# Patient Record
Sex: Male | Born: 1965 | Race: White | Hispanic: No | Marital: Single | Smoking: Current every day smoker
Health system: Southern US, Community
[De-identification: ages and names within clinical notes are randomized; demographics above are authoritative.]

## PROBLEM LIST (undated history)

## (undated) DIAGNOSIS — M48061 Spinal stenosis, lumbar region without neurogenic claudication: Secondary | ICD-10-CM

## (undated) DIAGNOSIS — G4730 Sleep apnea, unspecified: Secondary | ICD-10-CM

## (undated) DIAGNOSIS — G5710 Meralgia paresthetica, unspecified lower limb: Secondary | ICD-10-CM

## (undated) DIAGNOSIS — K649 Unspecified hemorrhoids: Secondary | ICD-10-CM

## (undated) DIAGNOSIS — H3500 Unspecified background retinopathy: Secondary | ICD-10-CM

## (undated) DIAGNOSIS — R32 Unspecified urinary incontinence: Secondary | ICD-10-CM

## (undated) DIAGNOSIS — E1140 Type 2 diabetes mellitus with diabetic neuropathy, unspecified: Secondary | ICD-10-CM

## (undated) DIAGNOSIS — J189 Pneumonia, unspecified organism: Secondary | ICD-10-CM

## (undated) DIAGNOSIS — E785 Hyperlipidemia, unspecified: Secondary | ICD-10-CM

## (undated) DIAGNOSIS — I509 Heart failure, unspecified: Secondary | ICD-10-CM

## (undated) DIAGNOSIS — F1910 Other psychoactive substance abuse, uncomplicated: Secondary | ICD-10-CM

## (undated) DIAGNOSIS — M47816 Spondylosis without myelopathy or radiculopathy, lumbar region: Secondary | ICD-10-CM

## (undated) DIAGNOSIS — E669 Obesity, unspecified: Secondary | ICD-10-CM

## (undated) DIAGNOSIS — E1121 Type 2 diabetes mellitus with diabetic nephropathy: Secondary | ICD-10-CM

## (undated) DIAGNOSIS — I5031 Acute diastolic (congestive) heart failure: Secondary | ICD-10-CM

## (undated) DIAGNOSIS — I10 Essential (primary) hypertension: Secondary | ICD-10-CM

## (undated) HISTORY — DX: Spinal stenosis, lumbar region without neurogenic claudication: M48.061

## (undated) HISTORY — DX: Type 2 diabetes mellitus with diabetic neuropathy, unspecified: E11.40

## (undated) HISTORY — DX: Other psychoactive substance abuse, uncomplicated: F19.10

## (undated) HISTORY — DX: Hyperlipidemia, unspecified: E78.5

## (undated) HISTORY — DX: Type 2 diabetes mellitus with diabetic nephropathy: E11.21

## (undated) HISTORY — DX: Unspecified hemorrhoids: K64.9

## (undated) HISTORY — DX: Unspecified urinary incontinence: R32

## (undated) HISTORY — DX: Unspecified background retinopathy: H35.00

## (undated) HISTORY — PX: ABDOMINAL SURGERY: SHX537

## (undated) HISTORY — DX: Essential (primary) hypertension: I10

## (undated) HISTORY — DX: Meralgia paresthetica, unspecified lower limb: G57.10

## (undated) HISTORY — PX: TONSILLECTOMY: SHX5217

## (undated) HISTORY — PX: COLONOSCOPY W/ POLYPECTOMY: SHX1380

## (undated) HISTORY — DX: Spondylosis without myelopathy or radiculopathy, lumbar region: M47.816

---

## 2006-10-21 ENCOUNTER — Emergency Department (HOSPITAL_COMMUNITY): Admission: EM | Admit: 2006-10-21 | Discharge: 2006-10-21 | Payer: Self-pay | Admitting: Emergency Medicine

## 2007-03-23 ENCOUNTER — Emergency Department (HOSPITAL_COMMUNITY): Admission: EM | Admit: 2007-03-23 | Discharge: 2007-03-23 | Payer: Self-pay | Admitting: Emergency Medicine

## 2007-03-23 ENCOUNTER — Encounter (INDEPENDENT_AMBULATORY_CARE_PROVIDER_SITE_OTHER): Payer: Self-pay | Admitting: Emergency Medicine

## 2007-03-23 ENCOUNTER — Ambulatory Visit: Payer: Self-pay | Admitting: Vascular Surgery

## 2007-07-07 ENCOUNTER — Encounter (INDEPENDENT_AMBULATORY_CARE_PROVIDER_SITE_OTHER): Payer: Self-pay | Admitting: Internal Medicine

## 2007-07-07 ENCOUNTER — Ambulatory Visit: Payer: Self-pay | Admitting: Family Medicine

## 2007-07-07 LAB — CONVERTED CEMR LAB
Basophils Absolute: 0 10*3/uL (ref 0.0–0.1)
Basophils Relative: 0 % (ref 0–1)
CO2: 27 meq/L (ref 19–32)
Creatinine, Ser: 1.03 mg/dL (ref 0.40–1.50)
Glucose, Bld: 193 mg/dL — ABNORMAL HIGH (ref 70–99)
Hemoglobin: 17.3 g/dL — ABNORMAL HIGH (ref 13.0–17.0)
Lymphocytes Relative: 26 % (ref 12–46)
Microalb, Ur: 1.61 mg/dL (ref 0.00–1.89)
Monocytes Absolute: 0.8 10*3/uL — ABNORMAL HIGH (ref 0.2–0.7)
Neutro Abs: 6.9 10*3/uL (ref 1.7–7.7)
Neutrophils Relative %: 63 % (ref 43–77)
Platelets: 234 10*3/uL (ref 150–400)
RDW: 12.8 % (ref 11.5–14.0)
Total Bilirubin: 0.6 mg/dL (ref 0.3–1.2)

## 2007-07-11 ENCOUNTER — Ambulatory Visit: Payer: Self-pay | Admitting: *Deleted

## 2007-11-08 ENCOUNTER — Ambulatory Visit: Payer: Self-pay | Admitting: Family Medicine

## 2008-01-03 ENCOUNTER — Ambulatory Visit: Payer: Self-pay | Admitting: Internal Medicine

## 2008-01-24 ENCOUNTER — Ambulatory Visit: Payer: Self-pay | Admitting: Internal Medicine

## 2008-01-24 ENCOUNTER — Encounter (INDEPENDENT_AMBULATORY_CARE_PROVIDER_SITE_OTHER): Payer: Self-pay | Admitting: Family Medicine

## 2008-01-24 LAB — CONVERTED CEMR LAB
ALT: 15 units/L (ref 0–53)
AST: 10 units/L (ref 0–37)
Albumin: 3.9 g/dL (ref 3.5–5.2)
Alkaline Phosphatase: 121 units/L — ABNORMAL HIGH (ref 39–117)
Potassium: 4.7 meq/L (ref 3.5–5.3)
Total Protein: 6.7 g/dL (ref 6.0–8.3)
Triglycerides: 792 mg/dL — ABNORMAL HIGH (ref <150)

## 2008-05-14 ENCOUNTER — Ambulatory Visit: Payer: Self-pay | Admitting: Internal Medicine

## 2008-05-18 ENCOUNTER — Emergency Department (HOSPITAL_COMMUNITY): Admission: EM | Admit: 2008-05-18 | Discharge: 2008-05-18 | Payer: Self-pay | Admitting: Emergency Medicine

## 2008-05-25 ENCOUNTER — Inpatient Hospital Stay (HOSPITAL_COMMUNITY): Admission: RE | Admit: 2008-05-25 | Discharge: 2008-05-29 | Payer: Self-pay | Admitting: General Surgery

## 2008-09-07 ENCOUNTER — Encounter (INDEPENDENT_AMBULATORY_CARE_PROVIDER_SITE_OTHER): Payer: Self-pay | Admitting: Adult Health

## 2008-09-07 ENCOUNTER — Ambulatory Visit: Payer: Self-pay | Admitting: Internal Medicine

## 2008-09-07 LAB — CONVERTED CEMR LAB
Albumin: 4.2 g/dL (ref 3.5–5.2)
CO2: 24 meq/L (ref 19–32)
Chloride: 105 meq/L (ref 96–112)
Cholesterol: 271 mg/dL — ABNORMAL HIGH (ref 0–200)
Glucose, Bld: 81 mg/dL (ref 70–99)
HCT: 49.4 % (ref 39.0–52.0)
Lymphocytes Relative: 27 % (ref 12–46)
Lymphs Abs: 2.3 10*3/uL (ref 0.7–4.0)
MCHC: 33 g/dL (ref 30.0–36.0)
MCV: 90.1 fL (ref 78.0–100.0)
Neutrophils Relative %: 62 % (ref 43–77)
Platelets: 162 10*3/uL (ref 150–400)
Potassium: 4.2 meq/L (ref 3.5–5.3)
Sodium: 141 meq/L (ref 135–145)
Total CHOL/HDL Ratio: 7.3
Total Protein: 7 g/dL (ref 6.0–8.3)
Triglycerides: 797 mg/dL — ABNORMAL HIGH (ref <150)
WBC: 8.5 10*3/uL (ref 4.0–10.5)

## 2008-09-21 ENCOUNTER — Ambulatory Visit: Payer: Self-pay | Admitting: Internal Medicine

## 2008-09-26 ENCOUNTER — Encounter: Payer: Self-pay | Admitting: Internal Medicine

## 2008-09-26 ENCOUNTER — Ambulatory Visit (HOSPITAL_COMMUNITY): Admission: RE | Admit: 2008-09-26 | Discharge: 2008-09-26 | Payer: Self-pay | Admitting: Internal Medicine

## 2008-09-26 ENCOUNTER — Ambulatory Visit: Payer: Self-pay | Admitting: Surgery

## 2008-10-30 ENCOUNTER — Ambulatory Visit: Payer: Self-pay | Admitting: Internal Medicine

## 2008-10-31 ENCOUNTER — Encounter (INDEPENDENT_AMBULATORY_CARE_PROVIDER_SITE_OTHER): Payer: Self-pay | Admitting: Internal Medicine

## 2008-12-19 ENCOUNTER — Ambulatory Visit: Payer: Self-pay | Admitting: Family Medicine

## 2009-02-20 ENCOUNTER — Ambulatory Visit: Payer: Self-pay | Admitting: Internal Medicine

## 2009-03-27 ENCOUNTER — Ambulatory Visit: Payer: Self-pay | Admitting: Internal Medicine

## 2009-05-21 ENCOUNTER — Ambulatory Visit: Payer: Self-pay | Admitting: Internal Medicine

## 2009-08-06 ENCOUNTER — Ambulatory Visit: Payer: Self-pay | Admitting: Internal Medicine

## 2009-08-06 ENCOUNTER — Encounter (INDEPENDENT_AMBULATORY_CARE_PROVIDER_SITE_OTHER): Payer: Self-pay | Admitting: Adult Health

## 2009-08-06 LAB — CONVERTED CEMR LAB
Albumin: 4.2 g/dL (ref 3.5–5.2)
Alkaline Phosphatase: 104 units/L (ref 39–117)
BUN: 17 mg/dL (ref 6–23)
Basophils Absolute: 0 10*3/uL (ref 0.0–0.1)
Basophils Relative: 0 % (ref 0–1)
CO2: 22 meq/L (ref 19–32)
Cholesterol: 316 mg/dL — ABNORMAL HIGH (ref 0–200)
Eosinophils Relative: 3 % (ref 0–5)
Glucose, Bld: 164 mg/dL — ABNORMAL HIGH (ref 70–99)
HCT: 46.8 % (ref 39.0–52.0)
HDL: 39 mg/dL — ABNORMAL LOW (ref >39)
Lymphocytes Relative: 31 % (ref 12–46)
Lymphs Abs: 2.7 10*3/uL (ref 0.7–4.0)
MCHC: 32.9 g/dL (ref 30.0–36.0)
MCV: 92.5 fL (ref 78.0–100.0)
Monocytes Absolute: 0.8 10*3/uL (ref 0.1–1.0)
Platelets: 175 10*3/uL (ref 150–400)
Potassium: 4.1 meq/L (ref 3.5–5.3)
RBC: 5.06 M/uL (ref 4.22–5.81)
Triglycerides: 1090 mg/dL — ABNORMAL HIGH (ref <150)
WBC: 8.6 10*3/uL (ref 4.0–10.5)

## 2009-08-07 ENCOUNTER — Encounter (INDEPENDENT_AMBULATORY_CARE_PROVIDER_SITE_OTHER): Payer: Self-pay | Admitting: Adult Health

## 2009-09-02 ENCOUNTER — Ambulatory Visit: Payer: Self-pay | Admitting: Internal Medicine

## 2009-09-04 ENCOUNTER — Ambulatory Visit (HOSPITAL_COMMUNITY): Admission: RE | Admit: 2009-09-04 | Discharge: 2009-09-04 | Payer: Self-pay | Admitting: Internal Medicine

## 2009-11-20 ENCOUNTER — Ambulatory Visit: Payer: Self-pay | Admitting: Family Medicine

## 2009-11-25 ENCOUNTER — Ambulatory Visit (HOSPITAL_COMMUNITY): Admission: RE | Admit: 2009-11-25 | Discharge: 2009-11-25 | Payer: Self-pay | Admitting: Internal Medicine

## 2010-01-10 ENCOUNTER — Ambulatory Visit: Payer: Self-pay | Admitting: Family Medicine

## 2010-02-18 ENCOUNTER — Ambulatory Visit: Payer: Self-pay | Admitting: Internal Medicine

## 2010-02-18 ENCOUNTER — Encounter (INDEPENDENT_AMBULATORY_CARE_PROVIDER_SITE_OTHER): Payer: Self-pay | Admitting: Adult Health

## 2010-02-18 LAB — CONVERTED CEMR LAB
Benzodiazepines.: NEGATIVE
Cocaine Metabolites: NEGATIVE
Creatinine,U: 151 mg/dL
Microalb, Ur: 10.43 mg/dL — ABNORMAL HIGH (ref 0.00–1.89)
Phencyclidine (PCP): NEGATIVE
Propoxyphene: NEGATIVE

## 2010-03-05 ENCOUNTER — Emergency Department (HOSPITAL_COMMUNITY)
Admission: EM | Admit: 2010-03-05 | Discharge: 2010-03-05 | Payer: Self-pay | Source: Home / Self Care | Admitting: Emergency Medicine

## 2010-03-26 ENCOUNTER — Ambulatory Visit: Payer: Self-pay | Admitting: Internal Medicine

## 2010-05-09 ENCOUNTER — Ambulatory Visit: Payer: Self-pay | Admitting: Internal Medicine

## 2010-05-09 LAB — CONVERTED CEMR LAB
BUN: 21 mg/dL (ref 6–23)
CO2: 25 meq/L (ref 19–32)
Calcium: 9.5 mg/dL (ref 8.4–10.5)
Cholesterol: 400 mg/dL — ABNORMAL HIGH (ref 0–200)
Glucose, Bld: 261 mg/dL — ABNORMAL HIGH (ref 70–99)
Sodium: 135 meq/L (ref 135–145)
Total Bilirubin: 0.5 mg/dL (ref 0.3–1.2)
Total Protein: 6.7 g/dL (ref 6.0–8.3)

## 2010-09-16 ENCOUNTER — Encounter (INDEPENDENT_AMBULATORY_CARE_PROVIDER_SITE_OTHER): Payer: Self-pay | Admitting: Internal Medicine

## 2010-09-16 LAB — CONVERTED CEMR LAB
Alkaline Phosphatase: 123 units/L — ABNORMAL HIGH (ref 39–117)
Basophils Absolute: 0.1 10*3/uL (ref 0.0–0.1)
CRP: 0.4 mg/dL (ref <0.6)
Cholesterol: 361 mg/dL — ABNORMAL HIGH (ref 0–200)
Direct LDL: 99 mg/dL
HCT: 50.1 % (ref 39.0–52.0)
HDL: 33 mg/dL — ABNORMAL LOW (ref >39)
Lymphs Abs: 2.2 10*3/uL (ref 0.7–4.0)
MCHC: 34.3 g/dL (ref 30.0–36.0)
MCV: 91.4 fL (ref 78.0–100.0)
Microalb, Ur: 90.64 mg/dL — ABNORMAL HIGH (ref 0.00–1.89)
Monocytes Relative: 7 % (ref 3–12)
Neutrophils Relative %: 68 % (ref 43–77)
RBC: 5.48 M/uL (ref 4.22–5.81)
Sed Rate: 11 mm/hr (ref 0–16)
Sodium: 136 meq/L (ref 135–145)
TSH: 2.23 microintl units/mL (ref 0.350–4.500)
Total Bilirubin: 0.4 mg/dL (ref 0.3–1.2)
Total CHOL/HDL Ratio: 10.9
Total Protein: 6.7 g/dL (ref 6.0–8.3)
Vitamin B-12: 624 pg/mL (ref 211–911)

## 2010-09-29 ENCOUNTER — Emergency Department (HOSPITAL_COMMUNITY)
Admission: EM | Admit: 2010-09-29 | Discharge: 2010-09-29 | Payer: Self-pay | Source: Home / Self Care | Admitting: Emergency Medicine

## 2010-09-29 ENCOUNTER — Ambulatory Visit (HOSPITAL_COMMUNITY)
Admission: RE | Admit: 2010-09-29 | Discharge: 2010-09-29 | Payer: Self-pay | Source: Home / Self Care | Attending: Family Medicine | Admitting: Family Medicine

## 2010-10-02 ENCOUNTER — Emergency Department (HOSPITAL_COMMUNITY)
Admission: EM | Admit: 2010-10-02 | Discharge: 2010-10-02 | Disposition: A | Discharge status: Home / Self Care | Payer: Worker's Compensation | Attending: Emergency Medicine | Admitting: Emergency Medicine

## 2010-10-02 ENCOUNTER — Emergency Department (HOSPITAL_COMMUNITY): Payer: Worker's Compensation

## 2010-10-02 DIAGNOSIS — F341 Dysthymic disorder: Secondary | ICD-10-CM | POA: Insufficient documentation

## 2010-10-02 DIAGNOSIS — Y92410 Unspecified street and highway as the place of occurrence of the external cause: Secondary | ICD-10-CM | POA: Insufficient documentation

## 2010-10-02 DIAGNOSIS — Z79899 Other long term (current) drug therapy: Secondary | ICD-10-CM | POA: Insufficient documentation

## 2010-10-02 DIAGNOSIS — S0990XA Unspecified injury of head, initial encounter: Secondary | ICD-10-CM | POA: Insufficient documentation

## 2010-10-02 DIAGNOSIS — I10 Essential (primary) hypertension: Secondary | ICD-10-CM | POA: Insufficient documentation

## 2010-10-02 DIAGNOSIS — E119 Type 2 diabetes mellitus without complications: Secondary | ICD-10-CM | POA: Insufficient documentation

## 2010-11-16 LAB — CBC
Hemoglobin: 14.1 g/dL (ref 13.0–17.0)
RBC: 4.48 MIL/uL (ref 4.22–5.81)
WBC: 7.3 10*3/uL (ref 4.0–10.5)

## 2010-11-16 LAB — RAPID URINE DRUG SCREEN, HOSP PERFORMED
Barbiturates: NOT DETECTED
Opiates: NOT DETECTED
Tetrahydrocannabinol: NOT DETECTED

## 2010-11-16 LAB — COMPREHENSIVE METABOLIC PANEL
Albumin: 3.2 g/dL — ABNORMAL LOW (ref 3.5–5.2)
BUN: 14 mg/dL (ref 6–23)
Calcium: 8.7 mg/dL (ref 8.4–10.5)
Chloride: 105 mEq/L (ref 96–112)
Potassium: 4.3 mEq/L (ref 3.5–5.1)
Total Bilirubin: 0.6 mg/dL (ref 0.3–1.2)
Total Protein: 6.4 g/dL (ref 6.0–8.3)

## 2010-11-16 LAB — DIFFERENTIAL
Basophils Relative: 1 % (ref 0–1)
Eosinophils Absolute: 0.1 10*3/uL (ref 0.0–0.7)
Eosinophils Relative: 1 % (ref 0–5)
Lymphocytes Relative: 22 % (ref 12–46)

## 2011-01-13 NOTE — Discharge Summary (Signed)
NAME:  Jerry Cantrell, Jerry Cantrell                ACCOUNT NO.:  [id number]   MEDICAL RECORD NO.:  [id number]          PATIENT TYPE:  OIB   LOCATION:  5155                         FACILITY:  [facility]   PHYSICIAN:  Joyice Faster. Cornett, M.D.DATE OF BIRTH:  March 03, 1966   DATE OF ADMISSION:  05/25/2008  DATE OF DISCHARGE:  05/29/2008                               DISCHARGE SUMMARY   PROCEDURE:  I&D of lower abdominal wall abscesses by Dr. Barry Dienes on  May 25, 2008.   CONSULTATIONS:  None.   REASON FOR ADMISSION:  Jerry Cantrell is a 45 year old white male with a  history of diabetes who has developed severe pain and swelling with a  significant amount of induration over his suprapubic area for  approximately 1 week prior to his admission.  At the time of admission,  Jerry Cantrell had a focal area of drainage around the right suprapubic area  that had a very foul odor.  At this time after his evaluation at our  office, Dr. Marlou Starks felt that these needed to be I&D and therefore was sent  over to the hospital where Dr. Barry Dienes tested him and an I&D was  performed.  Please see admitting history and physical for further  details.   ADMITTING DIAGNOSES:  1. Large abdominal wall abscess.  2. Diabetes.   HOSPITAL COURSE:  Once the patient arrived to the hospital, he was taken  to the OR where an incision and drainage of his abdominal wall abscess  was completed.  The patient tolerated this procedure well and was later  sent back to a regular floor bed with no further problems.  On  postoperative day #1, the patient had a moderate drainage from his  wound.  At this time, the patient was continued on IV vancomycin as well  as different p.r.n. medications for pain control.  Over the next several  days, the patient continued to have pain with some drainage from the  wound.  However, they continued to pack these with iodoform gauze.  By  postoperative day #3, the patient was still having pain; however, his  erythema  around these wounds have decreased, although the induration was  still there.  At this time, the drainage from his wound had somewhat  decreased.  At this time, his cultures were still pending except for one  anaerobic culture which showed few gram-positive cocci but this is still  a preliminary culture and no definitive bacteria has yet to be  differentiated out.  By postoperative day #4, the patient was continuing  to complain of some pain; however, at this time, he was ready to go  home.  On exam, once again his suprapubic region had a decrease in  erythema; however, was still very indurated.  At this time, his wounds  were clean and packed with iodoform gauze.  At this time, also home  health had been arranged and the patient was felt stable for discharge  back to his home with home health assistance for dressing changes.   DISCHARGE DIAGNOSES:  1. Multiple abdominal wall abscesses.  2. Status post incision and  drainage of multiple abdominal wall      abscesses.  3. Diabetes mellitus.   DISCHARGE MEDICATIONS:  1. The patient was informed that he may resume all home medications      which include a blood pressure medicine which he is unsure of and      insulin pen 70/30.  Apparently, he takes 70 units in the a.m. and      70 units at night.  2. Lantus 10 units at  8 p.m.  3. Insulin sliding scale as needed.  4. He was also given a prescription for Percocet 5/325 one-two tablets      q.4 h. p.r.n. for pain.  5. Doxycycline 100 mg b.i.d. for 14 days.   DISCHARGE INSTRUCTIONS:  The patient is informed that he may resume a  regular carbohydrate modified diet to help with his diabetes.  Otherwise, he may return to his normal activity including walking up  steps and walking.  Otherwise, he is allowed to shower; however, he is  not to bath for the next several weeks.  He is informed that when he  takes the shower, he needs to remove his wound dressings prior to his  shower and let the  water run in his wound.  Once he gets done with the  shower, he is to let this area dry and then repack it once his cleansing  process is complete.  Otherwise, home health has been advised to change  most of his abdominal dressings with 1/2-inch iodoform gauze once a day  and cover with a dry dressing.  Otherwise, the patient is informed to  return to see Dr. Barry Dienes at our office in approximately 1-2 weeks for  followup.  Also of note, the patient does not have any health insurance  and states that he gets most of his medicines from [facility].  He has  informed me that he will be able to get his doxycycline from  [facility], and he should be able to get this without a problem.      Henreitta Cea, PA      Marcello Moores A. Cornett, M.D.  Electronically Signed    KEO/MEDQ  D:  05/29/2008  T:  05/30/2008  Job:  [id number]   cc:   Sid Falcon, MD

## 2011-01-13 NOTE — Op Note (Signed)
NAME:  Jerry Cantrell, Jerry Cantrell                ACCOUNT NO.:  [id number]   MEDICAL RECORD NO.:  [id number]          PATIENT TYPE:  OIB   LOCATION:  5123                         FACILITY:  [facility]   PHYSICIAN:  Stark Klein, MD       DATE OF BIRTH:  12/21/1965   DATE OF PROCEDURE:  05/25/2008  DATE OF DISCHARGE:                               OPERATIVE REPORT   PREOPERATIVE DIAGNOSIS:  Abdominal wall abscess.   POSTOPERATIVE DIAGNOSIS:  Abdominal wall abscess.   PROCEDURE PERFORMED:  Incision and drainage of abdominal wall abscess  x2.   SURGEON:  Stark Klein, MD   ASSISTANT:  Henreitta Cea, PA   ANESTHESIA:  General.   FINDINGS:  Two abdominal wall abscesses, one in the right suprainguinal  region and one midline to the left inguinal region.  Minimal pus found,  but significant odor.  Multiloculated cavity.  Two cultures were taken.   COMPLICATIONS:  None.   PROCEDURE:  [name] was identified in the holding area and taken to  the operating room, where he was placed supine on the operating table.  General anesthesia was induced.  Time out was performed according to the  surgical checklist.  His lower abdominal wall was prepped and draped in  the sterile fashion.  He had a small punctate lesion draining pus on the  right groin.  This was incised with a #15 blade.  The subcutaneous  tissue was spread and probed, and there was a cavity with some pus in  it.  A piece of tissue as well as fluid was swabbed and sent for  culture.  There was an additional concerning area with thinned skin over  the top of it and this was aspirated and there was a purulent bloody  drainage found.  This was opened as well.  This also was probed  digitally.  The cavities were broken up.  Both areas were packed with a  2-inch iodoform gauze and dressed with wet gauze and mesh underwear.  The patient was awakened from anesthesia and taken to the PACU in good  condition.      Stark Klein, MD  Electronically  Signed     FB/MEDQ  D:  05/25/2008  T:  05/26/2008  Job:  [id number]

## 2011-06-01 LAB — URINALYSIS, ROUTINE W REFLEX MICROSCOPIC
Bilirubin Urine: NEGATIVE
Leukocytes, UA: NEGATIVE
Specific Gravity, Urine: 1.038 — ABNORMAL HIGH

## 2011-06-01 LAB — BODY FLUID CULTURE: Culture: NO GROWTH

## 2011-06-01 LAB — GLUCOSE, CAPILLARY
Glucose-Capillary: 426 — ABNORMAL HIGH
Glucose-Capillary: 176 — ABNORMAL HIGH
Glucose-Capillary: 185 — ABNORMAL HIGH
Glucose-Capillary: 266 — ABNORMAL HIGH
Glucose-Capillary: 282 — ABNORMAL HIGH
Glucose-Capillary: 71
Glucose-Capillary: 95

## 2011-06-01 LAB — DIFFERENTIAL
Basophils Absolute: 0
Basophils Absolute: 0
Basophils Relative: 0
Eosinophils Absolute: 0.1
Eosinophils Relative: 1
Eosinophils Relative: 2
Lymphocytes Relative: 18
Monocytes Absolute: 0.7
Monocytes Absolute: 0.8

## 2011-06-01 LAB — CBC
HCT: 45.7
HCT: 40.5
Hemoglobin: 15.8
Hemoglobin: 12.6 — ABNORMAL LOW
Hemoglobin: 13.8
MCHC: 34.1
MCV: 90.2
RBC: 4.13 — ABNORMAL LOW
RDW: 12.7
RDW: 12.7
RDW: 12.8

## 2011-06-01 LAB — ANAEROBIC CULTURE: Gram Stain: NONE SEEN

## 2011-06-01 LAB — BASIC METABOLIC PANEL
BUN: 18
BUN: 10
BUN: 18
CO2: 26
CO2: 28
Calcium: 9.3
Chloride: 98
Chloride: 96
GFR calc non Af Amer: >60
Glucose, Bld: 290 — ABNORMAL HIGH
Glucose, Bld: 251 — ABNORMAL HIGH
Glucose, Bld: 259 — ABNORMAL HIGH
Potassium: 4
Potassium: 3.9
Potassium: 4.1
Sodium: 134 — ABNORMAL LOW
Sodium: 134 — ABNORMAL LOW
Sodium: 140

## 2011-06-01 LAB — CULTURE, ROUTINE-ABSCESS

## 2011-06-15 LAB — POCT CARDIAC MARKERS
CKMB, poc: <1 — ABNORMAL LOW
Myoglobin, poc: 76.8
Operator id: 198171
Troponin i, poc: <0.05

## 2011-06-15 LAB — URINALYSIS, ROUTINE W REFLEX MICROSCOPIC
Glucose, UA: 250 — AB
Ketones, ur: NEGATIVE
Nitrite: NEGATIVE
Protein, ur: NEGATIVE
Urobilinogen, UA: 0.2

## 2011-06-15 LAB — CBC
Hemoglobin: 15.2
MCHC: 33.8
RBC: 5.07
WBC: 7.5

## 2011-06-15 LAB — I-STAT 8, (EC8 V) (CONVERTED LAB)
BUN: 21
Bicarbonate: 28.1 — ABNORMAL HIGH
Glucose, Bld: 262 — ABNORMAL HIGH
Hemoglobin: 16.7
TCO2: 29
pH, Ven: 7.463 — ABNORMAL HIGH

## 2011-06-15 LAB — DIFFERENTIAL
Basophils Relative: 1
Lymphs Abs: 1.6
Monocytes Absolute: 0.5
Monocytes Relative: 7
Neutro Abs: 5
Neutrophils Relative %: 67

## 2011-06-15 LAB — POCT I-STAT CREATININE: Operator id: 198171

## 2012-01-14 ENCOUNTER — Ambulatory Visit (HOSPITAL_COMMUNITY)
Admission: RE | Admit: 2012-01-14 | Discharge: 2012-01-14 | Disposition: A | Discharge status: Home / Self Care | Payer: Self-pay | Source: Ambulatory Visit | Attending: Family Medicine | Admitting: Family Medicine

## 2012-01-14 ENCOUNTER — Other Ambulatory Visit (HOSPITAL_COMMUNITY): Payer: Self-pay | Admitting: Family Medicine

## 2012-01-14 DIAGNOSIS — M545 Low back pain, unspecified: Secondary | ICD-10-CM | POA: Insufficient documentation

## 2012-01-14 DIAGNOSIS — M2550 Pain in unspecified joint: Secondary | ICD-10-CM

## 2012-01-14 DIAGNOSIS — M79609 Pain in unspecified limb: Secondary | ICD-10-CM | POA: Insufficient documentation

## 2012-01-14 DIAGNOSIS — M79659 Pain in unspecified thigh: Secondary | ICD-10-CM

## 2012-01-26 ENCOUNTER — Other Ambulatory Visit (HOSPITAL_COMMUNITY): Payer: Self-pay | Admitting: Family Medicine

## 2012-01-26 DIAGNOSIS — M79659 Pain in unspecified thigh: Secondary | ICD-10-CM

## 2012-01-28 ENCOUNTER — Ambulatory Visit (HOSPITAL_COMMUNITY)
Admission: RE | Admit: 2012-01-28 | Discharge: 2012-01-28 | Disposition: A | Discharge status: Home / Self Care | Payer: Self-pay | Source: Ambulatory Visit | Attending: Family Medicine | Admitting: Family Medicine

## 2012-01-28 DIAGNOSIS — M79659 Pain in unspecified thigh: Secondary | ICD-10-CM

## 2012-01-28 DIAGNOSIS — M545 Low back pain, unspecified: Secondary | ICD-10-CM | POA: Insufficient documentation

## 2012-01-28 DIAGNOSIS — M25559 Pain in unspecified hip: Secondary | ICD-10-CM | POA: Insufficient documentation

## 2012-01-28 DIAGNOSIS — M47817 Spondylosis without myelopathy or radiculopathy, lumbosacral region: Secondary | ICD-10-CM | POA: Insufficient documentation

## 2012-02-11 ENCOUNTER — Emergency Department (HOSPITAL_COMMUNITY)
Admission: EM | Admit: 2012-02-11 | Discharge: 2012-02-11 | Disposition: A | Discharge status: Home / Self Care | Payer: Self-pay | Source: Home / Self Care | Attending: Emergency Medicine | Admitting: Emergency Medicine

## 2012-02-16 ENCOUNTER — Encounter: Payer: Self-pay | Admitting: Physical Medicine & Rehabilitation

## 2012-05-03 ENCOUNTER — Encounter: Payer: Self-pay | Attending: Physical Medicine & Rehabilitation

## 2012-05-03 ENCOUNTER — Ambulatory Visit (HOSPITAL_BASED_OUTPATIENT_CLINIC_OR_DEPARTMENT_OTHER): Payer: Self-pay | Admitting: Physical Medicine & Rehabilitation

## 2012-05-03 ENCOUNTER — Encounter: Payer: Self-pay | Admitting: Physical Medicine & Rehabilitation

## 2012-05-03 ENCOUNTER — Other Ambulatory Visit: Payer: Self-pay | Admitting: Physical Medicine and Rehabilitation

## 2012-05-03 VITALS — BP 200/98 | HR 104 | Resp 16 | Ht 71.0 in | Wt 286.0 lb

## 2012-05-03 DIAGNOSIS — M48061 Spinal stenosis, lumbar region without neurogenic claudication: Secondary | ICD-10-CM

## 2012-05-03 DIAGNOSIS — G5710 Meralgia paresthetica, unspecified lower limb: Secondary | ICD-10-CM | POA: Insufficient documentation

## 2012-05-03 DIAGNOSIS — M47817 Spondylosis without myelopathy or radiculopathy, lumbosacral region: Secondary | ICD-10-CM | POA: Insufficient documentation

## 2012-05-03 DIAGNOSIS — R52 Pain, unspecified: Secondary | ICD-10-CM

## 2012-05-03 DIAGNOSIS — M47816 Spondylosis without myelopathy or radiculopathy, lumbar region: Secondary | ICD-10-CM

## 2012-05-03 DIAGNOSIS — R209 Unspecified disturbances of skin sensation: Secondary | ICD-10-CM | POA: Insufficient documentation

## 2012-05-03 DIAGNOSIS — E785 Hyperlipidemia, unspecified: Secondary | ICD-10-CM | POA: Insufficient documentation

## 2012-05-03 DIAGNOSIS — I10 Essential (primary) hypertension: Secondary | ICD-10-CM | POA: Insufficient documentation

## 2012-05-03 DIAGNOSIS — M545 Low back pain, unspecified: Secondary | ICD-10-CM | POA: Insufficient documentation

## 2012-05-03 DIAGNOSIS — Z5181 Encounter for therapeutic drug level monitoring: Secondary | ICD-10-CM

## 2012-05-03 DIAGNOSIS — E119 Type 2 diabetes mellitus without complications: Secondary | ICD-10-CM | POA: Insufficient documentation

## 2012-05-03 DIAGNOSIS — F17200 Nicotine dependence, unspecified, uncomplicated: Secondary | ICD-10-CM | POA: Insufficient documentation

## 2012-05-03 DIAGNOSIS — M79609 Pain in unspecified limb: Secondary | ICD-10-CM | POA: Insufficient documentation

## 2012-05-03 HISTORY — DX: Spondylosis without myelopathy or radiculopathy, lumbar region: M47.816

## 2012-05-03 HISTORY — DX: Spinal stenosis, lumbar region without neurogenic claudication: M48.061

## 2012-05-03 HISTORY — DX: Meralgia paresthetica, unspecified lower limb: G57.10

## 2012-05-03 NOTE — Progress Notes (Signed)
  Subjective:    Patient ID: Jerry Cantrell, male    DOB: 1966/05/12, 46 y.o.   MRN: [id number]  HPI    Review of Systems     Objective:   Physical Exam        Assessment & Plan:

## 2012-05-03 NOTE — Progress Notes (Signed)
Subjective:    Patient ID: Jerry Cantrell, male    DOB: 25-May-1966, 46 y.o.   MRN: [id number]  HPI Chief complaint is bilateral thigh pain. He experiences numbness. He's had problems for a couple years but worsening over last 4 months. He does notice some give way type weakness on the right side at times. He also has low back pain. He's had lumbar MRI in the past. This was reviewed with the patient. I looked at the films and cells. Report is below  *RADIOLOGY REPORT*  Clinical Data: Low back pain. Pain in thighs, hips to the knees.  MRI LUMBAR SPINE WITHOUT CONTRAST  Technique: Multiplanar and multiecho pulse sequences of the lumbar  spine were obtained without intravenous contrast.  Comparison: 01/14/2012 plain film exam. 11/25/2009 MR. 09/29/2010  CT.  Findings: Last fully open disc space is labeled L5-S1. Present  examination incorporates from T11-12 disc space through the mid  sacrum.  Conus T12-L1 disc space level.  Visualized paravertebral structures unremarkable.  T11-12 through L2-3 unremarkable.  L3-4: Mild facet joint degenerative changes.  L4-5: Bilateral facet joint degenerative changes. Bone edema right  L4 pedicle facet. Small amount fluid within the left L4-5 facet  joint. Bulge greatest right lateral position with encroachment  upon the exiting right L4 nerve root.  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  Bony edema within the right L5 the pedicle facet. Minimal anterior  slip L5. Mild bulge. Mild bilateral foraminal narrowing slightly  greater on the right.  IMPRESSION:  L4-5 bilateral facet joint degenerative changes. Bulge greatest  right lateral position with encroachment upon the exiting right L4  nerve root.  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 Bony edema  within the right L5 the  pedicle facet. Minimal anterior slip L5.  L5-S1 mild bulge. Mild bilateral L5-S1 foraminal narrowing  slightly greater on the right.  Pain Inventory Average Pain 10 Pain Right Now 10 My pain is sharp, burning, stabbing, tingling and aching  In the last 24 hours, has pain interfered with the following? General activity 10 Relation with others 10 Enjoyment of life 10 What TIME of day is your pain at its worst? all the time Sleep (in general) Fair  Pain is worse with: walking, bending, inactivity, standing and some activites Pain improves with: rest and pacing activities Relief from Meds: not on any  Mobility walk without assistance how many minutes can you walk? 15 ability to climb steps?  yes do you drive?  yes  Function employed # of hrs/week 24 hours  Neuro/Psych weakness numbness tingling trouble walking depression suicidal thoughts-no plan at this time, very tearful and upset  Prior Studies x-rays CT/MRI  Physicians involved in your care [facility] patient   Family History  Problem Relation Age of Onset  . Cancer Father    History   Social History  . Marital Status: Single    Spouse Name: N/A    Number of Children: N/A  . Years of Education: N/A   Social History Main Topics  . Smoking status: Current Everyday Smoker  . Smokeless tobacco: Never Used  . Alcohol Use: Yes  . Drug Use: None  . Sexually Active: None   Other Topics Concern  . None   Social History Narrative  . None   Past Surgical History  Procedure Date  . Tonsillectomy   .  Abdominal surgery     due to infected hair   Past Medical History  Diagnosis Date  . Diabetes mellitus   . Hypertension   . Hyperlipidemia    BP 200/98  Pulse 104  Resp 16  Ht 5\' 11"  (1.803 m)  Wt 286 lb (129.729 kg)  BMI 39.89 kg/m2  SpO2 97%     Review of Systems  Constitutional: Positive for diaphoresis.  Musculoskeletal: Positive for myalgias, back pain, arthralgias and gait problem.    Neurological: Positive for weakness and numbness.  Psychiatric/Behavioral: Positive for suicidal ideas and dysphoric mood.  All other systems reviewed and are negative.       Objective:   Physical Exam  Obese male in no acute distress Mood and affect are appropriate Motor strength is 5/5 in bilateral upper lobes remedies Sensation is hyper acetic in the lateral thigh bilaterally. He has tenderness over the ASIS bilaterally. Straight leg raising test is negative Back his no tenderness to palpation lumbar spine. He has limited range of motion in all directions. 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 Lumbar spondylosis no evidence of lumbar spinal stenosis. May benefit from medial branch blocks. 3.  Possible radiculopathy right lower extremity we'll check EMG if no improvement with nerve block Lateral femoral cutaneous nerve block Indication lateral thigh pain and numbness unresponsive to oral medications Informed consent was obtained after describing risks and benefits of the procedure with the patient these include bleeding bruising and infection he elects to proceed and has given written consent Patient placed in a supine position the ASIS area was palpated an area 1 cm inferior and 1 cm medial or marked and prepped with Betadine first on the right side and then the left side. Then a 25-gauge 1.5 inch needle was inserted and 1/2 cc of 40 November cc Depo-Medrol and 1.5 cc of 1% lidocaine were injected. The same procedure was repeated on the left side. Patient tolerated procedure well. No post procedure complications were experienced. Procedure instructions were given

## 2012-05-03 NOTE — Patient Instructions (Signed)
Your next visit will be for medial branch blocks this is to help with your back pain.

## 2012-05-06 ENCOUNTER — Telehealth: Payer: Self-pay | Admitting: Physical Medicine & Rehabilitation

## 2012-05-06 NOTE — Telephone Encounter (Signed)
Had shot on Tuesday and it is not helping please call.

## 2012-05-06 NOTE — Telephone Encounter (Signed)
Tried calling pt but I didn't get an answer and I couldn't leave a message.  Pt is going to be discharged due to having a positive Cocaine reading on his UDS.

## 2012-05-09 NOTE — Telephone Encounter (Signed)
LM for pt to call back.

## 2012-05-10 ENCOUNTER — Telehealth: Payer: Self-pay | Admitting: *Deleted

## 2012-05-10 NOTE — Telephone Encounter (Signed)
Tried numerous times to contact patient.  Please send letter due to inconsistent UDS.

## 2012-05-10 NOTE — Telephone Encounter (Signed)
Injections he had a last appointment aren't helping. Please call.  I spoke to the pt and let him know that we have received his UDS back and that it was positive for Cocaine. He denies using but he states that he has oral sex with a woman who swallows Cocaine to numb her throat during oral sex. I advised him that this will not cause a positive for Cocaine, so his is being discharged.

## 2012-05-12 ENCOUNTER — Encounter: Payer: Self-pay | Admitting: Internal Medicine

## 2012-05-12 ENCOUNTER — Encounter: Payer: Self-pay | Admitting: Physical Medicine & Rehabilitation

## 2012-05-12 ENCOUNTER — Ambulatory Visit (INDEPENDENT_AMBULATORY_CARE_PROVIDER_SITE_OTHER): Payer: Self-pay | Admitting: Internal Medicine

## 2012-05-12 VITALS — BP 165/96 | HR 72 | Temp 98.0°F | Ht 73.0 in | Wt 283.7 lb

## 2012-05-12 DIAGNOSIS — E1121 Type 2 diabetes mellitus with diabetic nephropathy: Secondary | ICD-10-CM

## 2012-05-12 DIAGNOSIS — E785 Hyperlipidemia, unspecified: Secondary | ICD-10-CM | POA: Insufficient documentation

## 2012-05-12 DIAGNOSIS — E1142 Type 2 diabetes mellitus with diabetic polyneuropathy: Secondary | ICD-10-CM

## 2012-05-12 DIAGNOSIS — Z299 Encounter for prophylactic measures, unspecified: Secondary | ICD-10-CM

## 2012-05-12 DIAGNOSIS — E119 Type 2 diabetes mellitus without complications: Secondary | ICD-10-CM

## 2012-05-12 DIAGNOSIS — K625 Hemorrhage of anus and rectum: Secondary | ICD-10-CM | POA: Insufficient documentation

## 2012-05-12 DIAGNOSIS — M48061 Spinal stenosis, lumbar region without neurogenic claudication: Secondary | ICD-10-CM

## 2012-05-12 DIAGNOSIS — K649 Unspecified hemorrhoids: Secondary | ICD-10-CM

## 2012-05-12 DIAGNOSIS — R32 Unspecified urinary incontinence: Secondary | ICD-10-CM | POA: Insufficient documentation

## 2012-05-12 DIAGNOSIS — E1149 Type 2 diabetes mellitus with other diabetic neurological complication: Secondary | ICD-10-CM

## 2012-05-12 DIAGNOSIS — Z79899 Other long term (current) drug therapy: Secondary | ICD-10-CM

## 2012-05-12 DIAGNOSIS — G5710 Meralgia paresthetica, unspecified lower limb: Secondary | ICD-10-CM

## 2012-05-12 DIAGNOSIS — I10 Essential (primary) hypertension: Secondary | ICD-10-CM | POA: Insufficient documentation

## 2012-05-12 DIAGNOSIS — Z23 Encounter for immunization: Secondary | ICD-10-CM

## 2012-05-12 DIAGNOSIS — E1140 Type 2 diabetes mellitus with diabetic neuropathy, unspecified: Secondary | ICD-10-CM

## 2012-05-12 HISTORY — DX: Unspecified urinary incontinence: R32

## 2012-05-12 HISTORY — DX: Unspecified hemorrhoids: K64.9

## 2012-05-12 HISTORY — DX: Type 2 diabetes mellitus with diabetic neuropathy, unspecified: E11.40

## 2012-05-12 HISTORY — DX: Type 2 diabetes mellitus with diabetic nephropathy: E11.21

## 2012-05-12 LAB — LIPID PANEL
Cholesterol: 186 mg/dL (ref 0–200)
HDL: 46 mg/dL (ref >39)
LDL Cholesterol: 116 mg/dL — ABNORMAL HIGH (ref 0–99)
Triglycerides: 120 mg/dL (ref <150)
VLDL: 24 mg/dL (ref 0–40)

## 2012-05-12 MED ORDER — BLOOD GLUCOSE METER KIT: PACK | Status: DC

## 2012-05-12 MED ORDER — INSULIN ASPART 100 UNIT/ML ~~LOC~~ SOLN: 100.0000 [IU] | Freq: Every day | SUBCUTANEOUS | Status: DC

## 2012-05-12 MED ORDER — INSULIN GLARGINE 100 UNIT/ML ~~LOC~~ SOLN: 40.0000 [IU] | Freq: Two times a day (BID) | SUBCUTANEOUS | Status: DC

## 2012-05-12 MED ORDER — SYRINGE (DISPOSABLE) 1 ML MISC: 1.0000 | Freq: Three times a day (TID) | Status: DC

## 2012-05-12 MED ORDER — CYCLOBENZAPRINE HCL 10 MG PO TABS: 10.0000 mg | ORAL_TABLET | Freq: Three times a day (TID) | ORAL | Status: DC | PRN

## 2012-05-12 MED ORDER — METOPROLOL SUCCINATE ER 200 MG PO TB24: 200.0000 mg | ORAL_TABLET | Freq: Every day | ORAL | Status: DC

## 2012-05-12 MED ORDER — DILTIAZEM HCL ER BEADS 360 MG PO CP24: 360.0000 mg | ORAL_CAPSULE | Freq: Every day | ORAL | Status: DC

## 2012-05-12 MED ORDER — OMEGA-3-ACID ETHYL ESTERS 1 G PO CAPS: 2.0000 g | ORAL_CAPSULE | Freq: Two times a day (BID) | ORAL | Status: DC

## 2012-05-12 MED ORDER — PREGABALIN 100 MG PO CAPS: 200.0000 mg | ORAL_CAPSULE | Freq: Three times a day (TID) | ORAL | Status: DC

## 2012-05-12 MED ORDER — LISINOPRIL 40 MG PO TABS: 40.0000 mg | ORAL_TABLET | Freq: Every day | ORAL | Status: DC

## 2012-05-12 MED ORDER — GABAPENTIN 600 MG PO TABS: 600.0000 mg | ORAL_TABLET | Freq: Three times a day (TID) | ORAL | Status: DC

## 2012-05-12 NOTE — Assessment & Plan Note (Signed)
Will continue Lyrica for now which seems to improve pain, once has [employer], will try to refer to a new pain Management specialist.

## 2012-05-12 NOTE — Assessment & Plan Note (Signed)
Long h/o HTN. On multiple medications; however endorses noncompliance. Pt to continue Lisinopril, Metoprolol, and Diltiazem, and he was given a prescription for all his medications. Will stop Lasix for now due to urinary incontinence.

## 2012-05-12 NOTE — Progress Notes (Signed)
Patient ID: Jerry Cantrell, male   DOB: 1966-01-16, 46 y.o.   MRN: [id number]  Subjective:   Patient ID: Jerry Cantrell male   DOB: [birth year] 46 y.o.   MRN: [id number]  HPI: Jerry Cantrell is a 46 y.o. male w/ PMH DM II, HLD, HTN, lumbar stenosis and spondylosis, and meralgia paraesthetica who was a former Careers information officer pt, and presents to clinic to be established as a new patient. He also c/o of urinary incontinence x2 weeks and blood in his stool x 3wks.  He was diagnosed with diabetes "a while ago" and was put on Lantus 40u BID, Novolog 100u daily, and Januvia. He states that he tries to take his medications as prescribed, but did run out of Lantus 1-2 weeks ago.  He is taking his Novolog daily. His CBGs normally run in the 220s, but he has notchecked his blood glucose recently b/c he gave away his meter. 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   He does have a h/o meralgia paraesthetica, and was told that is could be 2/2 to his DM or due to his lumbar spinal disease. He was placed on Lyrica, which he says does improve the pain he experiences. He recently saw a Pain Management specialist on 05/03/12 and received injections in his lateral cutaneous femoral nerve bilaterally, which unfortunately did not improve his sx. Unfortunately his UDS from that visit was + for cocaine and he was fired. He denies using and ilicit drugs.  He has a diagnosis of lumbar stenosis and spondylosis. He did have an MRI 5/13 which showed L4-5 facet joint degenerative changes w/ bulge on the L4 exiting nerve root and L5-S1 bulge with foraminal stenosis. He was previously taking Tramadol and Flexeril for his lumbar spinal pain, given to him from [facility]. He has taken OxyContin in the past but did not feel like it improved his pain, and he does not want to take narcotics.  According to the pt and to his medical record, no pain medication was written for at his appt on 9/3. He was to return for a spinal injection later this month, but was fired from the pain mgmt clinic for the + UDS.  He also has a h/o HTN, dx at 46yo. He has been on multiple medications, including Lisinopril, Lasix, Metoprolol, and Diltiazem, but endorses difficulty with compliance. He does endorse headaches at times when he feels that his pressures are elevated. His last BP on 05/03/12 was 200/98.   He has a history of hyperlipidemia and is supposed to be taking Lipitor 40mg  qday and Lovaza 2g BID. He was unable to get the Lipitor filled due to financial constraints, and states that he is noncompliant with the Lovaza. His last lipid panel was 09/16/10, with Tot cholest 361, LDL was not calculated, HDL 33, and TG 1672. He endorses changes to his diet over the past year, and has cut out most fried foods and steams his vegetables.  Recently, Jerry Cantrell has been experiencing urinary incontinence with difficulty initiating urination. He feels as thought he empties his bladder completely and denies any blood or cloudiness to his urine.   He also endorses bright red and dark blood in his stool over the past 3 weeks. He has a history of colon polyps excised on colonoscopy 5 ys ago, and he  was told to have a repeat scope in 9 ys. His father has a h/o colon cancer and is in remission. He does have a h/o hemorrhoids, but feels that this blood is different from the bleeding he has experienced from his hemorrhoids.    Past Medical History  Diagnosis Date  . Diabetes mellitus   . Hypertension   . Hyperlipidemia   . Meralgia paraesthetica 05/03/2012  . Lumbar spondylosis 05/03/2012  . Lumbar spinal stenosis 05/03/2012    Mild with only right L4 nerve root encroachment, no neurogenic claudication   . 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 Urinary incontinence 05/12/2012    Since the start of Sept. 2013   .  Hemorrhoids 05/12/2012  . UTI (lower urinary tract infection)    Current Outpatient Prescriptions  Medication Sig Dispense Refill  . diltiazem (TIAZAC) 360 MG 24 hr capsule Take 1 capsule (360 mg total) by mouth daily.  30 capsule  11  . gabapentin (NEURONTIN) 600 MG tablet Take 1 tablet (600 mg total) by mouth 3 (three) times daily.  90 tablet  1  . insulin aspart (NOVOLOG) 100 UNIT/ML injection Inject 100 Units into the skin daily.  3 vial  11  . lisinopril (PRINIVIL,ZESTRIL) 40 MG tablet Take 1 tablet (40 mg total) by mouth daily.  30 tablet  11  . metoprolol (TOPROL-XL) 200 MG 24 hr tablet Take 1 tablet (200 mg total) by mouth daily.  30 tablet  11  . omega-3 acid ethyl esters (LOVAZA) 1 G capsule Take 2 capsules (2 g total) by mouth 2 (two) times daily.  60 capsule  11  . pregabalin (LYRICA) 100 MG capsule Take 2 capsules (200 mg total) by mouth 3 (three) times daily.  90 capsule  1  . DISCONTD: diltiazem (TIAZAC) 360 MG 24 hr capsule Take 360 mg by mouth daily.      Marland Kitchen DISCONTD: diltiazem (TIAZAC) 360 MG 24 hr capsule Take 1 capsule (360 mg total) by mouth daily.  30 capsule  11  . DISCONTD: gabapentin (NEURONTIN) 600 MG tablet Take 600 mg by mouth 3 (three) times daily.      Marland Kitchen DISCONTD: insulin aspart (NOVOLOG) 100 UNIT/ML injection Inject 100 Units into the skin daily.      Marland Kitchen DISCONTD: lisinopril (PRINIVIL,ZESTRIL) 40 MG tablet Take 40 mg by mouth daily.      Marland Kitchen DISCONTD: metoprolol (TOPROL-XL) 200 MG 24 hr tablet Take 200 mg by mouth daily.      Marland Kitchen DISCONTD: omega-3 acid ethyl esters (LOVAZA) 1 G capsule Take 2 g by mouth 2 (two) times daily.      Marland Kitchen DISCONTD: pregabalin (LYRICA) 100 MG capsule Take 200 mg by mouth 3 (three) times daily.      . Blood Glucose Monitoring Suppl (BLOOD GLUCOSE METER) kit Use as instructed  1 each  0  . cyclobenzaprine (FLEXERIL) 10 MG tablet Take 1 tablet (10 mg total) by mouth 3 (three) times daily as needed.  30 tablet  1  . insulin glargine (LANTUS) 100  UNIT/ML injection Inject 40 Units into the skin 2 (two) times daily.  10 mL  11  . Syringe, Disposable, 1 ML MISC 1 Syringe by Does not apply route 3 (three) times daily.  100 each  11  . DISCONTD: insulin glargine (LANTUS) 100 UNIT/ML injection Inject 40 Units into the skin 2 (two) times daily.      Marland Kitchen DISCONTD: insulin glargine (LANTUS) 100 UNIT/ML injection Inject 40 Units into the skin  2 (two) times daily.  10 mL  11   Family History  Problem Relation Age of Onset  . Cancer Father     Colon cancer- in remission   History   Social History  . Marital Status: Single    Spouse Name: N/A    Number of Children: N/A  . Years of Education: N/A   Social History Main Topics  . Smoking status: Current Every Day Smoker -- 0.7 packs/day    Types: Cigarettes  . Smokeless tobacco: Never Used  . Alcohol Use: 7.2 oz/week    12 Cans of beer per week  . Drug Use: None  . Sexually Active: Yes     Oral sex only   Other Topics Concern  . None   Social History Narrative  . None   Review of Systems: Constitutional: Denies fever, chills, diaphoresis, appetite change and fatigue.  HEENT: Endorses decreased visual acuity in his right eye. Denies photophobia, eye pain, redness, hearing loss, ear pain, congestion, sore throat, rhinorrhea, sneezing, mouth sores, trouble swallowing, neck pain, neck stiffness and tinnitus.   Respiratory: Denies SOB, DOE, cough, chest tightness,  and wheezing.   Cardiovascular: Denies chest pain, palpitations and leg swelling.  Gastrointestinal: Endorses blood in stool. Denies nausea, vomiting, diarrhea, constipation, blood in stool and abdominal distention.  Genitourinary: Endorses urinary incontinence and difficulty initiating urination. Denies urgency, frequency, hematuria, or flank pain.  Musculoskeletal: Endorses occasional RLE weakness w/ pain in bilateral lateral thighs. Endorses lumbar pain. Denies joint swelling, arthralgias or gait problem.  Skin: Denies  pallor, rash and wound.  Neurological: Endorses numbness and tingling in B hands and B toes. Denies dizziness, seizures, syncope, weakness, or light-headedness.  Hematological: Denies adenopathy. Easy bruising, personal or family bleeding history  Psychiatric/Behavioral: Denies suicidal ideation, mood changes, confusion, nervousness, sleep disturbance and agitation  Objective:  Physical Exam: Filed Vitals:   05/12/12 0831  BP: 165/96  Pulse: 72  Temp: 98 F (36.7 C)  TempSrc: Oral  Height: 6\' 1"  (1.854 m)  Weight: 283 lb 11.2 oz (128.685 kg)  SpO2: 99%   Constitutional: Vital signs reviewed.  Patient is a well-developed and well-nourished male in no acute distress and cooperative with exam. Alert and oriented x3.  Head: Normocephalic and atraumatic Ear: TM normal bilaterally Mouth: Poor dentition, missing upper teethNo erythema or exudates, MMM. Eyes: PERRL, EOMI, conjunctivae normal, No scleral icterus. Acrochordons present on left upper lid. Neck: Supple, Trachea midline normal ROM, No JVD, mass, thyromegaly, or carotid bruit present.  Cardiovascular: RRR, S1 normal, S2 normal, no MRG, pulses symmetric and intact bilaterally Pulmonary/Chest: CTAB, no wheezes, rales, or rhonchi Abdominal: Obese, soft, non-tender, non-distended, bowel sounds are normal, no masses, organomegaly, or guarding present.  GU: No CVA tenderness Rectal: No masses palpable, prostate smooth, hemacult negative. Musculoskeletal: No joint deformities, erythema, or stiffness, ROM full, no nontender, no foot drop. Hematology: no cervical, inginal, or axillary adenopathy.  Neurological: A&O x3, Strength is normal and symmetric bilaterally, cranial nerve II-XII are grossly intact, no focal motor deficit, sensory intact to light touch, slightly diminished in right toes.  Skin: Warm, dry and intact. No rash, cyanosis, or clubbing.  Psychiatric: Normal mood and affect. Speech and behavior is normal. Judgment and thought  content normal. Cognition and memory appear normal.   Assessment & Plan:  Pt received Tdap and flu shot today.  He is applying for his [employer], and will go to [facility] department for his mediations until he receives his card.  Please seen problem list based A&P.

## 2012-05-12 NOTE — Assessment & Plan Note (Signed)
Pt is to continue Lantus 40u BIS and Novolog 100u daily. He is to stop the Januvia at this time. He was given a sample of the Lantus and prescriptions for the Lantus and Novolog. He has been given a prescription for a glucometer, and is to call Donnan Plyler this coming Monday so she can set him up with the correct meter, lancets, and test strips. He is to see [name] on the day of his next clinic visit with me for diabetes education.  At this time, he is to continue Neurontin for the neuropathy in his hands and toes. Once his sugars and pain are better managed, will try to stop either the Neurontin or the Lyrica.

## 2012-05-12 NOTE — Assessment & Plan Note (Addendum)
Possibly 2/2 hemorrhoids. No frank blood on DRE, hemacult neg. Pt due for repeat colonoscopy given family hx and previous polyp resection 5 ys ago.  Referral placed for colonoscopy.

## 2012-05-12 NOTE — Assessment & Plan Note (Addendum)
Today A1c 8.0 w/ CBG 176. He is trying to take his insulin as prescribed, but has had difficulty affording it. A sample of the Lantus was given today. He also received rx's for the Lantus and Novolog, and a glucometer kit. He is to call  Checking urine microalbumin/Cr ratio today. [name] Plyler on Monday and will see her at his next clinic visit in 2 weeks.

## 2012-05-12 NOTE — Assessment & Plan Note (Signed)
Pt interested in trying spinal inj for his pain. Unfortunately, he was fired from his previous pain mgmt physician. At next visit, hopefully he will have an [employer] and he can be referred to Physical Therapy and possibly Sports Medicine. Will try to refer him to a new pain specialist as well. At this time he is not interested in narcotic medications. Will restart Flexeril, which seamed to work for the pt in the past.

## 2012-05-12 NOTE — Assessment & Plan Note (Signed)
Previous lipid panel with significantly elevated TGs and elevated cholesterol. Repeating lipid panel today. Continuing the Lovaza and will hold off on the Lipitor for now. Based on his previous panel, the TG are the most important thing to address at this time- I feel that if new medications are added, he will be more overwhelmed and will not take them.

## 2012-05-12 NOTE — Patient Instructions (Addendum)
Continue your Lisinopril, Diltiazem, and Metoprolol. Stop the Lasix.  Continue the Lantus and Novolog. You were given a sample of the Lantus today and 20 syringes. A prescription was given to you for a glucose meter to check your blood sugars. Please check your sugars before meals but at least once a day and try to keep a log. Call the clinic on Monday and speak with [facility] about getting a permanent meter, lancets, and testing strips. You will need to see [name] at your next appointment in 2 weeks.  Continue the Neurontin and Lyrica for your nerve pains. Continue the Flexeril for your lumbar spinal pain. We will refer you to Physical Therapy and/or Sports Medicine at your next visit- Hopefully you will have an [place] then which will help with the cost. The goal is to get you back in with Pain Management for injections for your spine and try to get you off the Lyrica or Neurontin.  ** If you begin having bowel incontinence, foot drop, or leg weakness, come into the ED as soon as possible to be evaluated**  You received your flu shot and Tdap vaccine today. You may experience some discomfort at the injection sites.     Diabetes, Type 2 Diabetes is a long-lasting (chronic) disease. In type 2 diabetes, the pancreas does not make enough insulin (a hormone), and the body does not respond normally to the insulin that is made. This type of diabetes was also previously called adult-onset diabetes. It usually occurs after the age of 65, but it can occur at any age.   CAUSES   Type 2 diabetes happens because the pancreas is not making enough insulin or your body has trouble using the insulin that your pancreas does make properly. SYMPTOMS    Drinking more than usual.   Urinating more than usual.   Blurred vision.   Dry, itchy skin.   Frequent infections.   Feeling more tired than usual (fatigue).  DIAGNOSIS The diagnosis of type 2 diabetes is usually made by one of the following  tests:  Fasting blood glucose test. You will not eat for at least 8 hours and then take a blood test.   Random blood glucose test. Your blood glucose (sugar) is checked at any time of the day regardless of when you ate.   Oral glucose tolerance test (OGTT). Your blood glucose is measured after you have not eaten (fasted) and then after you drink a glucose containing beverage.  TREATMENT    Healthy eating.   Exercise.   Medicine, if needed.   Monitoring blood glucose.   Seeing your caregiver regularly.  HOME CARE INSTRUCTIONS    Check your blood glucose at least once a day. More frequent monitoring may be necessary, depending on your medicines and on how well your diabetes is controlled. Your caregiver will advise you.   Take your medicine as directed by your caregiver.   Do not smoke.   Make wise food choices. Ask your caregiver for information. Weight loss can improve your diabetes.   Learn about low blood glucose (hypoglycemia) and how to treat it.   Get your eyes checked regularly.   Have a yearly physical exam. Have your blood pressure checked and your blood and urine tested.   Wear a pendant or bracelet saying that you have diabetes.   Check your feet every night for cuts, sores, blisters, and redness. Let your caregiver know if you have any problems.  SEEK MEDICAL CARE IF:  You have problems keeping your blood glucose in target range.   You have problems with your medicines.   You have symptoms of an illness that do not improve after 24 hours.   You have a sore or wound that is not healing.   You notice a change in vision or a new problem with your vision.   You have a fever.  MAKE SURE YOU:  Understand these instructions.   Will watch your condition.   Will get help right away if you are not doing well or get worse.  Document Released: 08/17/2005 Document Revised: 08/06/2011 Document Reviewed: 02/02/2011 [facility] Patient Information 2012 [place].

## 2012-05-12 NOTE — Assessment & Plan Note (Signed)
Had x 2weeks. Possibly 2/2 diabetes with the Lasix or form UTI. Does have h/o UTIs.  Checking UA with urine culture and stopping Lasix.

## 2012-05-12 NOTE — Addendum Note (Signed)
Addended by: Julieanne Manson A on: 05/12/2012 02:06 PM   Modules accepted: Orders

## 2012-05-13 LAB — URINALYSIS, ROUTINE W REFLEX MICROSCOPIC
Glucose, UA: NEGATIVE mg/dL
Leukocytes, UA: NEGATIVE
pH: 5 (ref 5.0–8.0)

## 2012-05-13 LAB — MICROALBUMIN / CREATININE URINE RATIO
Creatinine, Urine: 140.3 mg/dL
Microalb Creat Ratio: 563.8 mg/g — ABNORMAL HIGH (ref 0.0–30.0)
Microalb, Ur: 79.1 mg/dL — ABNORMAL HIGH (ref 0.00–1.89)

## 2012-05-13 LAB — URINALYSIS, MICROSCOPIC ONLY
Crystals: NONE SEEN
Squamous Epithelial / LPF: NONE SEEN

## 2012-05-13 NOTE — Progress Notes (Signed)
INTERNAL MEDICINE TEACHING ATTENDING ADDENDUM - Jerry Falcon, MD: I personally saw and evaluated Jerry Cantrell in this clinic visit in conjunction with the resident, Jerry Cantrell. I have discussed patient's plan of care with medical resident during this visit. I have confirmed the physical exam findings and have read and agree with the clinic note including the plan with the following addition:  Jerry Cantrell has a very complicated medical history and current situation.  Jerry Cantrell has thoroughly addressed the issues we discussed with the patient in her note.  He will need frequent and close follow up with Jerry Cantrell to get his medical condition under control and to stabilize his diabetes and blood pressure.

## 2012-05-14 LAB — URINE CULTURE: Colony Count: NO GROWTH

## 2012-05-17 ENCOUNTER — Telehealth: Payer: Self-pay | Admitting: *Deleted

## 2012-05-17 NOTE — Telephone Encounter (Signed)
Received his discharge letter. Can he still get his injections? He also wants all sexual things removed from his chart.

## 2012-05-17 NOTE — Telephone Encounter (Signed)
Spoke to Jerry Cantrell and let him know that we will not be able to give him injections?  I questioned him about what he meant about sexual things in his chart. He states he had someone check his chart and that is how he found out what was documented. I told him that we have to document whatever is said to [place] to cover ourselves. He was still persistent that he wanted it removed. I offered to let him speak to our office manager. He agreed.  Faythe Dingwall will contact him about this.

## 2012-05-19 ENCOUNTER — Other Ambulatory Visit: Payer: Self-pay | Admitting: Dietician

## 2012-05-19 ENCOUNTER — Telehealth: Payer: Self-pay | Admitting: Internal Medicine

## 2012-05-19 DIAGNOSIS — E1140 Type 2 diabetes mellitus with diabetic neuropathy, unspecified: Secondary | ICD-10-CM

## 2012-05-26 ENCOUNTER — Ambulatory Visit: Payer: Self-pay | Admitting: Physical Medicine & Rehabilitation

## 2012-05-27 ENCOUNTER — Encounter: Payer: Self-pay | Admitting: Dietician

## 2012-05-27 ENCOUNTER — Ambulatory Visit: Payer: Self-pay | Admitting: Internal Medicine

## 2012-06-01 ENCOUNTER — Encounter: Payer: Self-pay | Admitting: Internal Medicine

## 2012-06-01 NOTE — Telephone Encounter (Signed)
[name] left paperwork from the [facility] to receive his Lantus at a discounted cost, but per Yvonna Alanis, we do not fill these out for him, but he can go to the health dept and they will fill these out for him. Unfortunately he left before I could return them to him, and her did not show up for his f/u appt. They will be in my box if he returns.

## 2012-06-02 ENCOUNTER — Ambulatory Visit (INDEPENDENT_AMBULATORY_CARE_PROVIDER_SITE_OTHER): Payer: Self-pay | Admitting: Internal Medicine

## 2012-06-02 ENCOUNTER — Encounter: Payer: Self-pay | Admitting: Dietician

## 2012-06-02 ENCOUNTER — Encounter: Payer: Self-pay | Admitting: Internal Medicine

## 2012-06-02 ENCOUNTER — Ambulatory Visit (INDEPENDENT_AMBULATORY_CARE_PROVIDER_SITE_OTHER): Payer: Self-pay | Admitting: Dietician

## 2012-06-02 VITALS — BP 169/106 | HR 77 | Temp 98.3°F | Ht 73.0 in | Wt 287.9 lb

## 2012-06-02 DIAGNOSIS — K625 Hemorrhage of anus and rectum: Secondary | ICD-10-CM

## 2012-06-02 DIAGNOSIS — E1149 Type 2 diabetes mellitus with other diabetic neurological complication: Secondary | ICD-10-CM

## 2012-06-02 DIAGNOSIS — E1140 Type 2 diabetes mellitus with diabetic neuropathy, unspecified: Secondary | ICD-10-CM

## 2012-06-02 DIAGNOSIS — Z0000 Encounter for general adult medical examination without abnormal findings: Secondary | ICD-10-CM

## 2012-06-02 DIAGNOSIS — E1142 Type 2 diabetes mellitus with diabetic polyneuropathy: Secondary | ICD-10-CM

## 2012-06-02 DIAGNOSIS — E785 Hyperlipidemia, unspecified: Secondary | ICD-10-CM

## 2012-06-02 DIAGNOSIS — M48061 Spinal stenosis, lumbar region without neurogenic claudication: Secondary | ICD-10-CM

## 2012-06-02 DIAGNOSIS — E1121 Type 2 diabetes mellitus with diabetic nephropathy: Secondary | ICD-10-CM

## 2012-06-02 DIAGNOSIS — Z23 Encounter for immunization: Secondary | ICD-10-CM

## 2012-06-02 DIAGNOSIS — I10 Essential (primary) hypertension: Secondary | ICD-10-CM

## 2012-06-02 DIAGNOSIS — R32 Unspecified urinary incontinence: Secondary | ICD-10-CM

## 2012-06-02 MED ORDER — TERAZOSIN HCL 1 MG PO CAPS: 1.0000 mg | ORAL_CAPSULE | Freq: Every day | ORAL | Status: DC

## 2012-06-02 MED ORDER — PRAVASTATIN SODIUM 40 MG PO TABS: 40.0000 mg | ORAL_TABLET | Freq: Every evening | ORAL | Status: DC

## 2012-06-02 MED ORDER — AMLODIPINE BESYLATE 5 MG PO TABS: 5.0000 mg | ORAL_TABLET | Freq: Every day | ORAL | Status: DC

## 2012-06-02 MED ORDER — INSULIN GLARGINE 100 UNIT/ML ~~LOC~~ SOLN: 40.0000 [IU] | Freq: Two times a day (BID) | SUBCUTANEOUS | Status: DC

## 2012-06-02 MED ORDER — PNEUMOCOCCAL VAC POLYVALENT 25 MCG/0.5ML IJ INJ: 0.5000 mL | INJECTION | INTRAMUSCULAR | Status: DC

## 2012-06-02 NOTE — Assessment & Plan Note (Signed)
The patient has a history of DM, with last Hb A1C = 8.0.  However, it is difficult to know how to adjust the patient's insulin, given that he does not regularly check his blood sugar, and adjusts his novolog "free-hand" on a daily basis. -we discussed the importance of checking his blood sugar at least 3 times per day -we decided to set the patient's novolog on a set scale, so that we can better adjust his insulin to meet his A1C goals: 20 units with breakfast  20 units with lunch  20 units with dinner  15 units at bedtime if your blood sugar is over 200 -continue lantus 40 BID -patient to see Debera Lat after today's visit

## 2012-06-02 NOTE — Assessment & Plan Note (Signed)
Patient previously discharged from pain clinic for UDS positive for cocaine.  Patient interested in trying another pain clinic.  Patient states he never did cocaine, and is adamant he will not do it again. -will check UDS today, refer if negative

## 2012-06-02 NOTE — Progress Notes (Signed)
Diabetes Self-Management Training (DSMT)  Initial Visit  06/02/2012 [name], identified by name and date of birth, is a 46 y.o. male with patient seems to think his diabetes is due to pancreatitis when he was younger and that his pancreas does not make enough insulin. Year of diabetes diagnosis: 2000  ASSESSMENT Patient concerns are Monitoring and Support.  There were no vitals taken for this visit. There is no height or weight on file to calculate BMI. Lab Results  Component Value Date   LDLCALC 116* 05/12/2012   Lab Results  Component Value Date   HGBA1C 8.0 05/12/2012   Labs reviewed.  Family history of diabetes: No Support systems: has lady friend, does his own shopping and food prep for his 15 year old son Special needs: Simplified materials, Verbal instruction Prior DM Education: Yes Patients belief/attitude about diabetes: Diabetes can be controlled. Self foot exams daily: Yes Diabetes Complications: Neuropathy   Medications See Medications list.  Needs skills/knowledge review    Exercise Plan Doing ADLs, physical/active work and interested in improving for 60 minutesa day.   Self-Monitoring Monitor: does not see value in self monitoring, did recently pick up wave sense presto but only has 10 strips, did not purchase 50 for 6$ due to money constraints Frequency of testing: stopped testing Goes by how he feels, urinating, thirsty, shakes Hyperglycemia: Yes Daily Hypoglycemia: Yes Rarely   Meal Planning Some knowledge and does not seem interested in improving currently   Assessment comments: patient verbalizes content with current A1C. May need to clarify his gols and our goals        INDIVIDUAL DIABETES EDUCATION PLAN:  Monitoring Psychosocial adjustment _______________________________________________________________________  Intervention TOPICS COVERED TODAY:  Monitoring  Purpose and frequency of SMBG. Psychosocial adjustment  Worked with patient  to identify barriers to care and solutions Helped patient identify a support system for diabetes management Identified and addressed patients feelings and concerns about diabetes  PATIENTS GOALS/PLAN (copy and paste in patient instructions so patient receives a copy): 1.  Learning Objective:       State how to care for diabetes 2.  Behavioral Objective:         Monitoring: To identify blood glucose trends, I will check blood sugar as discussed and follow up with meter and/or Accu chek 360 view sheet Never 0%  Personalized Follow-Up Plan for Ongoing Self Management Support:  [place], family and CDE visits ______________________________________________________________________   Outcomes Expected outcomes: Demonstrated limited interest in learning. Expect minimal changes. Self-care Barriers: Low literacy, Lack of material resources, Substance Abuse Education material provided: yes- accuchek 360 view Patient to contact team via Phone if problems or questions. Time in: 0930     Time out: 1030  Future DSMT - 2 wks   Balin Vandegrift, [name]

## 2012-06-02 NOTE — Patient Instructions (Addendum)
Jerry Cantrell,    I forgot to give you this from today's visit. I am looking forward to working with you.  [name] [phone]  This is the agreement/goal we discussed:   To identify blood glucose trends, I will check blood sugar as discussed and follow up with meter and/or Accu chek 360 view sheet.

## 2012-06-02 NOTE — Patient Instructions (Addendum)
For your blood pressure, we are starting a medication called Amlodipine.  Take 1 pill once per day.  For your urinary symptoms, we are starting a medication called Terazosin.  Take 1 pill once per day.  You may experience symptoms of lightheadedness when standing from a sitting position.  For your cholesterol, take Pravastatin, 1 tablet every evening.  We need to standardize the amount of insulin you take each day to get your Hemoglobin A1C under better control. -Continue to take Lantus 40 units twice per day -Take Novolog on a set scale: 20 units with breakfast 20 units with lunch 20 units with dinner 15 units at bedtime if your blood sugar is over 200  We are unable to give your TB skin test today because it must be read 2-3 days later, and our office is closed on the weekend.  Please return on Monday for a Nursing visit to receive this test.  Please return for a follow-up visit in 1-2 months for a follow-up visit.  Bring your glucometer to this visit.

## 2012-06-02 NOTE — Assessment & Plan Note (Signed)
The patient notes no blood per rectum since his last visit.  The patient is still waiting for GI referral for colonoscopy. -checked on GI referral status, still in process

## 2012-06-02 NOTE — Assessment & Plan Note (Signed)
The patient's BP is elevated today.  However, he notes that he has been unable to afford his diltiazem medication, and thus has not filled it.  However, after the patient left the office today, I realized that diltiazem is on many $4 prescription lists, so I am unclear why the patient had difficulty filling this medication.  Nonetheless, we will try a different medication. -stop diltiazem, start amlodipine instead -terazosin for possible BPH may have some BP effects as well

## 2012-06-02 NOTE — Progress Notes (Signed)
HPI The patient is a 46 y.o. yo male with a history of HTN, HL, DM, presenting for a follow-up visit.  The patient has a history of DM.  Since his last appointment, he has been able to obtain a glucometer, and checks his blood sugar about once per day.  His fasting blood sugars have reportedly been in the 200's lately, though he did not bring his glucometer to today's visit.  He notes occasional symptoms of mild polyuria and polydipsia, stating that he typically urinates 2-3 times per night and several times during the day.  No blurry vision.  No episodes of hypoglycemia.  The patient uses Lantus 40 BID, as well as Novolog on a rough self-determined sliding scale, typically about 20 units with breakfast, lunch, dinner, and bedtime.  He states that his novolog usage usually totals 100 units per day, and that he adjusts each dose accordingly.  The patient notes symptoms of a slow urinary stream, difficulty initiating stream, and feelings of incomplete voiding.  DRE performed by Dr. Eulas Post at his last visit was normal.  The patient has not noticed any blood in his stool since his last visit.  The patient notes that he typically uses neurotin for his pain, but the price has now increased and he is no longer able to afford this medication.  He wants to start taking his prescribed lyrica, but has had trouble getting this medication, needing some paperwork filled out.  ROS: General: no fevers, chills, changes in weight, changes in appetite Skin: no rash HEENT: no blurry vision, hearing changes, sore throat Pulm: no dyspnea, coughing, wheezing CV: no chest pain, palpitations, shortness of breath Abd: no abdominal pain, nausea/vomiting, diarrhea/constipation GU: no dysuria, hematuria Ext: no arthralgias, myalgias Neuro: no weakness, numbness, or tingling  Filed Vitals:   06/02/12 0834  BP: 169/106  Pulse: 77  Temp: 98.3 F (36.8 C)    PEX General: alert, cooperative, and in no apparent  distress HEENT: pupils equal round and reactive to light, vision grossly intact, oropharynx clear and non-erythematous  Neck: supple, no lymphadenopathy Lungs: clear to ascultation bilaterally, normal work of respiration, no wheezes, rales, ronchi Heart: regular rate and rhythm, no murmurs, gallops, or rubs Abdomen: soft, non-tender, non-distended, normal bowel sounds Extremities: no cyanosis, clubbing, or edema Neurologic: alert & oriented X3, cranial nerves II-XII intact, strength grossly intact, sensation intact to light touch  Current Outpatient Prescriptions on File Prior to Visit  Medication Sig Dispense Refill  . Blood Glucose Monitoring Suppl (BLOOD GLUCOSE METER) kit Use as instructed  1 each  0  . cyclobenzaprine (FLEXERIL) 10 MG tablet Take 1 tablet (10 mg total) by mouth 3 (three) times daily as needed.  30 tablet  1  . diltiazem (TIAZAC) 360 MG 24 hr capsule Take 1 capsule (360 mg total) by mouth daily.  30 capsule  11  . gabapentin (NEURONTIN) 600 MG tablet Take 1 tablet (600 mg total) by mouth 3 (three) times daily.  90 tablet  1  . insulin aspart (NOVOLOG) 100 UNIT/ML injection Inject 100 Units into the skin daily.  3 vial  11  . insulin glargine (LANTUS) 100 UNIT/ML injection Inject 40 Units into the skin 2 (two) times daily.  10 mL  11  . lisinopril (PRINIVIL,ZESTRIL) 40 MG tablet Take 1 tablet (40 mg total) by mouth daily.  30 tablet  11  . metoprolol (TOPROL-XL) 200 MG 24 hr tablet Take 1 tablet (200 mg total) by mouth daily.  30 tablet  11  . omega-3 acid ethyl esters (LOVAZA) 1 G capsule Take 2 capsules (2 g total) by mouth 2 (two) times daily.  60 capsule  11  . pregabalin (LYRICA) 100 MG capsule Take 2 capsules (200 mg total) by mouth 3 (three) times daily.  90 capsule  1  . Syringe, Disposable, 1 ML MISC 1 Syringe by Does not apply route 3 (three) times daily.  100 each  11    Assessment/Plan

## 2012-06-02 NOTE — Assessment & Plan Note (Addendum)
-  pneumovax given today -patient desired PPD, but today is Thursday, no one available to read test on sat/sun.  Patient to return early next week for PPD

## 2012-06-02 NOTE — Assessment & Plan Note (Signed)
The patient's description of his urinary incontinence appears different from what he stated at his last visit.  He now endorses more LUTS of weak stream, urinary hesitancy, and incomplete voiding.  DRE performed at last visit. -start terazosin, will assess affect on symptoms

## 2012-06-02 NOTE — Assessment & Plan Note (Signed)
Patient has a history of HL, with LDL = 116 at his last visit.  Medication not started at that time, since other medications were more important, and it was thought that the patient would not be compliant if given too many prescriptions (which I agree with). -will prescribe pravastatin today, with hope for compliance

## 2012-06-03 LAB — PRESCRIPTION ABUSE MONITORING 15P, URINE
Amphetamine/Meth: NEGATIVE ng/mL
Barbiturate Screen, Urine: NEGATIVE ng/mL
Buprenorphine, Urine: NEGATIVE ng/mL
Cocaine Metabolites: NEGATIVE ng/mL
Creatinine, Urine: 109.3 mg/dL (ref >20.0)
Fentanyl, Ur: NEGATIVE ng/mL
Meperidine, Ur: NEGATIVE ng/mL
Propoxyphene: NEGATIVE ng/mL

## 2012-06-23 ENCOUNTER — Encounter: Payer: Self-pay | Admitting: Dietician

## 2012-07-05 ENCOUNTER — Ambulatory Visit (INDEPENDENT_AMBULATORY_CARE_PROVIDER_SITE_OTHER): Payer: Self-pay | Admitting: Dietician

## 2012-07-05 ENCOUNTER — Telehealth: Payer: Self-pay | Admitting: *Deleted

## 2012-07-05 DIAGNOSIS — G5710 Meralgia paresthetica, unspecified lower limb: Secondary | ICD-10-CM

## 2012-07-05 DIAGNOSIS — E1142 Type 2 diabetes mellitus with diabetic polyneuropathy: Secondary | ICD-10-CM

## 2012-07-05 DIAGNOSIS — E1149 Type 2 diabetes mellitus with other diabetic neurological complication: Secondary | ICD-10-CM

## 2012-07-05 DIAGNOSIS — E1140 Type 2 diabetes mellitus with diabetic neuropathy, unspecified: Secondary | ICD-10-CM

## 2012-07-05 MED ORDER — PREGABALIN 100 MG PO CAPS: 200.0000 mg | ORAL_CAPSULE | Freq: Three times a day (TID) | ORAL | Status: DC

## 2012-07-05 NOTE — Progress Notes (Signed)
Diabetes Self-Management Training (DSMT)  Initial Visit  07/05/2012 Mr. Bourne Drabik, identified by name and date of birth, is a 46 y.o. 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 There is no height or weight on file to calculate BMI. Lab Results  Component Value Date   LDLCALC 116* 05/12/2012   Lab Results  Component Value Date   HGBA1C 8.0 05/12/2012   Labs reviewed.  Family history of diabetes: yes- sister had an insulin pump  Support systems: has lady friend, does his own shopping and food prep for his 31 year old son Special needs: Simplified materials, Verbal instruction Prior DM Education: Yes Patients belief/attitude about diabetes: Diabetes can be controlled. Self foot exams daily: Yes Diabetes Complications: Neuropathy- bothering him today   Medications See Medications list.  Needs skills/knowledge review    Exercise Plan Doing ADLs, physical/active work and interested in improving for 60 minutesa day.- used 60 units Novolgon work day and 100 & 130 units Novolog on non work days   Self-Monitoring Monitor: One Water quality scientist and accu chek 360 view reviewed with patient. He is guessing about insulin doses without checking blood sugar. Not checking with hypoglycemia symptoms at work.  Average # of test/week- ~5 68% > 140  Hyperglycemia: Yes Daily Hypoglycemia: Yes Rarely   Meal Planning Some knowledge and does not seem interested in improving currently   Assessment comments: patient kept 360 view blood glucose record as planned. Little pattern noted other than :2 were not work days and had significantly higher and less predictable blood sugars with more complete data and one was a work day with less data and blood sugars closer to target.       INDIVIDUAL DIABETES EDUCATION PLAN:    Monitoring Psychosocial adjustment _______________________________________________________________________  Intervention TOPICS COVERED TODAY:  Monitoring  Purpose and frequency of SMBG. Psychosocial adjustment  Worked with patient to identify barriers to care and solutions Helped patient identify a support system for diabetes management Identified and addressed patients feelings and concerns about diabetes  PATIENTS GOALS/PLAN (copy and paste in patient instructions so patient receives a copy): 1.  Learning Objective:       State how to care for diabetes 2.  Behavioral Objective:         Monitoring: To identify blood glucose trends, I will check blood sugar as discussed and follow up with meter and/or Accu chek 360 view sheet Never 100%           Patient to try to take meter (and ? Insulin) to work to check post before work meal blood glucoses and glucose while at work. Uses 100-130 units on non work days, 60 on work day. Gave patient a 5 units to 15 gram carb to insulin and 3 unit to 50 mg/dl correction wheel to try to using to prevent guessing too much or too little pre-meal doses with caution about using it on work days to account for activity.   Personalized Follow-Up Plan for Ongoing Self Management Support:  [place], family and CDE visits ______________________________________________________________________   Outcomes Expected outcomes: Demonstrated limited interest in learning. Expect minimal changes. Self-care Barriers: Low literacy, Lack of material resources,  Education material provided: yes Patient to contact team via Phone if problems or questions. Time in: 1055     Time out: 1130  Future DSMT - 2  wks   P[place]ler, [name]

## 2012-07-05 NOTE — Telephone Encounter (Signed)
Pt brings letter from [place] for medication assistance through [facility], [facility] does not accept scripts written by residents, it must be an attending, could you please reprint and sign so i can fax it to [place]  Thank you

## 2012-07-15 ENCOUNTER — Encounter: Payer: Self-pay | Admitting: Family Medicine

## 2012-07-15 ENCOUNTER — Ambulatory Visit (INDEPENDENT_AMBULATORY_CARE_PROVIDER_SITE_OTHER): Payer: Self-pay | Admitting: Family Medicine

## 2012-07-15 VITALS — BP 198/131 | HR 92 | Ht 73.0 in | Wt 287.0 lb

## 2012-07-15 DIAGNOSIS — G8929 Other chronic pain: Secondary | ICD-10-CM

## 2012-07-15 DIAGNOSIS — M545 Low back pain, unspecified: Secondary | ICD-10-CM

## 2012-07-15 MED ORDER — AMITRIPTYLINE HCL 50 MG PO TABS: 50.0000 mg | ORAL_TABLET | Freq: Every day | ORAL | Status: DC

## 2012-07-15 NOTE — Progress Notes (Signed)
  Subjective:    Patient ID: Jerry Cantrell, male    DOB: 11/15/1965, 46 y.o.   MRN: [id number]  HPI  Here for further evaluation of a chronic low back pain issue. Also wants my opinion on some new burning anterior thigh pain. #1. Many years history of low back pain. Worse with a lot of heavy lifting. Has had MRI. 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 Has never had a specific injury. Has had no numbness or giving way of either leg. He does occasionally have shooting pains down the right side to the sole of his foot. Has used gabapentin and more recently Lyrica with some improvement in his pain. Has had no bowel or bladder incontinence. #2. Bilateral anterior thigh pain that is tingling and burning. Says it feels just like the type of pain he has in his feet and hands that he has been told is diabetic neuropathy. This pain started in the last few weeks. Is intermittent but lasts a good portion of every day. Not worsened by any specific activity and there is nothing specifically he can do to make it better.  PERTINENT  PMH / PSH: Diagnosed with diabetes mellitus at age 41 per his report. He reports this is type 1 diabetes. His chart indicates it is type II Diagnosed in year 2000. Last A1c was 8.0 Review of Systems Denies unusual weight change, fever, sweats, chills    Objective:   Physical Exam Vital signs are reviewed. Noted this is systolic blood pressure of 99991111 with a diastolic of [id number]. He is asymptomatic. GENERAL: Obese male no acute distress. BACK: Nontender to palpation. Limited flexion at the hips secondary to musculoskeletal tightness. Straight leg raise negative bilaterally. Lower extremity strength is 5 out of 5 in flexion and extension. DTRs are 2+ at the knee and ankle. IMAGING: Elective his MRI which shows potential nerve root impingement on the right L4 nerve root.       Assessment & Plan:

## 2012-08-04 ENCOUNTER — Encounter: Payer: Self-pay | Admitting: Internal Medicine

## 2012-08-18 ENCOUNTER — Encounter: Payer: Self-pay | Admitting: Internal Medicine

## 2012-08-18 ENCOUNTER — Ambulatory Visit (INDEPENDENT_AMBULATORY_CARE_PROVIDER_SITE_OTHER): Payer: No Typology Code available for payment source | Admitting: Internal Medicine

## 2012-08-18 VITALS — BP 159/96 | HR 78 | Temp 97.8°F | Ht 73.0 in | Wt 296.6 lb

## 2012-08-18 DIAGNOSIS — L918 Other hypertrophic disorders of the skin: Secondary | ICD-10-CM

## 2012-08-18 DIAGNOSIS — Z720 Tobacco use: Secondary | ICD-10-CM

## 2012-08-18 DIAGNOSIS — E1121 Type 2 diabetes mellitus with diabetic nephropathy: Secondary | ICD-10-CM

## 2012-08-18 DIAGNOSIS — M48061 Spinal stenosis, lumbar region without neurogenic claudication: Secondary | ICD-10-CM

## 2012-08-18 DIAGNOSIS — Z79899 Other long term (current) drug therapy: Secondary | ICD-10-CM

## 2012-08-18 DIAGNOSIS — E1140 Type 2 diabetes mellitus with diabetic neuropathy, unspecified: Secondary | ICD-10-CM

## 2012-08-18 DIAGNOSIS — E1142 Type 2 diabetes mellitus with diabetic polyneuropathy: Secondary | ICD-10-CM

## 2012-08-18 DIAGNOSIS — I10 Essential (primary) hypertension: Secondary | ICD-10-CM

## 2012-08-18 DIAGNOSIS — IMO0002 Reserved for concepts with insufficient information to code with codable children: Secondary | ICD-10-CM

## 2012-08-18 DIAGNOSIS — E1149 Type 2 diabetes mellitus with other diabetic neurological complication: Secondary | ICD-10-CM

## 2012-08-18 DIAGNOSIS — G8929 Other chronic pain: Secondary | ICD-10-CM

## 2012-08-18 DIAGNOSIS — L919 Hypertrophic disorder of the skin, unspecified: Secondary | ICD-10-CM

## 2012-08-18 DIAGNOSIS — E1165 Type 2 diabetes mellitus with hyperglycemia: Secondary | ICD-10-CM

## 2012-08-18 DIAGNOSIS — L909 Atrophic disorder of skin, unspecified: Secondary | ICD-10-CM

## 2012-08-18 MED ORDER — INSULIN ASPART 100 UNIT/ML ~~LOC~~ SOLN: 15.0000 [IU] | Freq: Three times a day (TID) | SUBCUTANEOUS | Status: DC

## 2012-08-18 MED ORDER — CHLORTHALIDONE 25 MG PO TABS: 25.0000 mg | ORAL_TABLET | Freq: Every day | ORAL | Status: DC

## 2012-08-18 MED ORDER — BLOOD GLUCOSE METER KIT: PACK | Status: DC

## 2012-08-18 MED ORDER — AMITRIPTYLINE HCL 50 MG PO TABS: 50.0000 mg | ORAL_TABLET | Freq: Every day | ORAL | Status: DC

## 2012-08-18 NOTE — Assessment & Plan Note (Addendum)
BP Readings from Last 3 Encounters:  08/18/12 159/96  07/15/12 198/131  06/02/12 169/106    Lab Results  Component Value Date   NA 136 09/16/2010   K 4.6 09/16/2010   CREATININE 0.91 09/16/2010    Assessment:  Blood pressure control:  Improved   Progress toward BP goal:   Movign in the right direction but still elevated   Plan:  Medications:  Please see below  Educational resources provided: brochure   BP elevated today, despite reported compliance. Continuing the Amlodipine 5mg  daily, Lisinopril 40mg  daily, and Toprol XL 200mg  daily. Starting Chlorthalidone 25mg  daily, which is on the $4 list. Pt to return to clinic for BMP within 3 weeks to check potassium levels. He is to f/u in clinic in 3 months.

## 2012-08-18 NOTE — Assessment & Plan Note (Signed)
  Assessment:  Progress toward smoking cessation:   none   Barriers to progress toward smoking cessation:   addiction  Comments: Pt wants to quit.   Plan:  Instruction/counseling given:  I counseled patient on the dangers of tobacco use, advised patient to stop smoking and reviewed strategies to maximize success.  Educational resources provided:  [facility] (1-800-QUIT-NOW) brochure  Self management tools provided:     Medications to assist with smoking cessation:  Recommeneded the pt call 1-800-QUITNOW for assistance with cessation.

## 2012-08-18 NOTE — Assessment & Plan Note (Signed)
For cosmetic reasons, pt requesting to have them removed. Referred to Dermatology.

## 2012-08-18 NOTE — Assessment & Plan Note (Signed)
C/o chronic back pain and bilateral thigh pain and numbness. Seen by Sports Medicine and Lyrica and amitriptyline helping pain. Pt request to be sent back to pain specialist. Will refer to Pain Management; last UDS was negative, so hopefully they will accept him.

## 2012-08-18 NOTE — Assessment & Plan Note (Signed)
Lab Results  Component Value Date   HGBA1C 8.5 08/18/2012   HGBA1C 8.0 05/12/2012     Assessment:  Diabetes control:  moderate- unchanged  Progress toward A1C goal:   unchanged    Plan:  Medications:  continue current medications  Home glucose monitoring:   Frequency:  TID    Timing:  before meals  Instruction/counseling given: discussed diet and CBG monitoring  Educational resources provided: brochure  Self management tools provided:    Other plans: Pt to return to clinic over the next 1-3 weeks for fasting CBG, will readjust medications at that time if needed.

## 2012-08-18 NOTE — Patient Instructions (Signed)
**  Decrease the dose of your Novolog to 15u 3 times a day with meals. Please check your blood sugar levels 3 times a day before meals. Please keep a record of the things you are eating, to help improve your dietary habits.   **Chlorthalidone, a new blood pressure medication has been added to your regimen. Please be sure to take this daily. You need to have labs drawn in 1 week.   **You need to return to the clinic in 1 week for a fasting blood glucose and electrolyte check.

## 2012-08-18 NOTE — Progress Notes (Signed)
Patient ID: Kristoph Maheshwari, male   DOB: 06/25/1966, 46 y.o.   MRN: [id number]  Subjective:   Patient ID: Tsuyoshi Whary male   DOB: Jul 20, 1966 46 y.o.   MRN: [id number]  HPI: Mr.Fontaine Schweers is a 46 y.o. with uncontrolled HTN, DM II, diabetic neuropathy, and chronic back pain who presents for a routine follow up. He states that he is taking all of his medications daily, but he does not regularly check his CBGs. Despite that, his CBG this morning was around 289. He endorses continued unhealthy eating habits, and states that he ate 2 steak biscuits this morning for breakfast but nothing since.  He was seen by Sports Medicine for his chronic lower back pain, where Amitriptyline was started. The pt states that this has improved his pain along with the Lyrica. He states that he was not scheduled for a f/u by the Sports Medicine physician, but was reportedly told that the only thing that would help relieve his pain is surgery.  Mr. Wolfgramm also has a few skin tags on his left eye lid that he would like removed for cosmetic reasons.  Past Medical History  Diagnosis Date  . Diabetes mellitus   . Hypertension   . Hyperlipidemia   . Meralgia paraesthetica 05/03/2012  . Lumbar spondylosis 05/03/2012  . Lumbar spinal stenosis 05/03/2012    Mild with only right L4 nerve root encroachment, no neurogenic claudication   . 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 Urinary incontinence 05/12/2012    Since the start of Sept. 2013   . Hemorrhoids 05/12/2012  . UTI (lower urinary tract infection)   . Meralgia paraesthetica 05/03/2012    On Lyrica which does improve pain.   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 Substance abuse    Current Outpatient Prescriptions  Medication Sig Dispense Refill  . amitriptyline (ELAVIL) 50 MG tablet Take 1 tablet (50 mg total) by mouth at bedtime.  30 tablet  3  . amLODipine (NORVASC) 5 MG tablet Take 1 tablet (5 mg total) by mouth daily.  30 tablet  3  . Blood Glucose Monitoring  Suppl (BLOOD GLUCOSE METER) kit Use as instructed  1 each  0  . cyclobenzaprine (FLEXERIL) 10 MG tablet Take 1 tablet (10 mg total) by mouth 3 (three) times daily as needed.  30 tablet  1  . insulin aspart (NOVOLOG) 100 UNIT/ML injection Inject 15 Units into the skin 3 (three) times daily before meals.  10 mL  11  . insulin glargine (LANTUS) 100 UNIT/ML injection Inject 40 Units into the skin 2 (two) times daily.  15 mL  11  . lisinopril (PRINIVIL,ZESTRIL) 40 MG tablet Take 1 tablet (40 mg total) by mouth daily.  30 tablet  11  . metoprolol (TOPROL-XL) 200 MG 24 hr tablet Take 1 tablet (200 mg total) by mouth daily.  30 tablet  11  . omega-3 acid ethyl esters (LOVAZA) 1 G capsule Take 2 capsules (2 g total) by mouth 2 (two) times daily.  60 capsule  11  . pravastatin (PRAVACHOL) 40 MG tablet Take 1 tablet (40 mg total) by mouth every evening.  30 tablet  11  . pregabalin (LYRICA) 100 MG capsule Take 2 capsules (200 mg total) by mouth 3 (three) times daily.  90 capsule  1  . Syringe, Disposable, 1 ML MISC 1 Syringe by Does not apply route 3 (three) times daily.  100 each  11  . terazosin (HYTRIN) 1 MG capsule Take 1 capsule (1  mg total) by mouth at bedtime.  30 capsule  2  . chlorthalidone (HYGROTON) 25 MG tablet Take 1 tablet (25 mg total) by mouth daily.  30 tablet  11   Family History  Problem Relation Age of Onset  . Cancer Father     Colon cancer- in remission  . Alcohol abuse Father    History   Social History  . Marital Status: Single    Spouse Name: N/A    Number of Children: N/A  . Years of Education: N/A   Occupational History  . dietary services [employer]   Social History Main Topics  . Smoking status: Current Every Day Smoker -- 0.7 packs/day    Types: Cigarettes  . Smokeless tobacco: Never Used  . Alcohol Use: 7.2 oz/week    12 Cans of beer per week     Comment: occasionally  . Drug Use: None  . Sexually Active: None     Comment: Oral sex only   Other Topics  Concern  . None   Social History Narrative  . None   Review of Systems: ROS as per HPI  Objective:  Physical Exam: Filed Vitals:   08/18/12 1436  BP: 159/96  Pulse: 78  Temp: 97.8 F (36.6 C)  TempSrc: Oral  Height: 6\' 1"  (1.854 m)  Weight: 296 lb 9.6 oz (134.537 kg)  SpO2: 97%   Constitutional: Vital signs reviewed.  Patient is a well-developed and well-nourished male in no acute distress and cooperative with exam. Alert and oriented x3.  Head: Normocephalic and atraumatic; skin tags on left and right eye lids Eyes: PERRL, EOM Neck: Supple, shotty cervical lymphadenopathy, trachea midline  Cardiovascular: RRR, no MRG, pulses symmetric and intact bilaterally Pulmonary/Chest: CTAB, no wheezes, rales, or rhonchi Neurological: A&O x3 Psychiatric: Normal mood and affect.  Assessment & Plan:  Please refer to Problem List based Assessment and Plan

## 2012-08-19 ENCOUNTER — Telehealth: Payer: Self-pay | Admitting: *Deleted

## 2012-08-19 DIAGNOSIS — E1140 Type 2 diabetes mellitus with diabetic neuropathy, unspecified: Secondary | ICD-10-CM

## 2012-08-19 MED ORDER — FREESTYLE SYSTEM KIT: 1.0000 | PACK | Freq: Three times a day (TID) | Status: DC

## 2012-08-19 MED ORDER — GLUCOSE BLOOD VI STRP: ORAL_STRIP | Status: DC

## 2012-08-19 MED ORDER — FREESTYLE LANCETS MISC: Status: DC

## 2012-08-19 NOTE — Addendum Note (Signed)
Addended by: Clayburn Pert F on: 08/19/2012 01:25 PM   Modules accepted: Orders

## 2012-08-19 NOTE — Telephone Encounter (Signed)
Please clarify script for cbg mon kit, how many times test, insulin dependent, diag code, please do a new script and send electronically

## 2012-08-22 MED ORDER — FREESTYLE SYSTEM KIT: PACK | Status: DC

## 2012-08-22 MED ORDER — GLUCOSE BLOOD VI STRP: ORAL_STRIP | Status: DC

## 2012-08-29 NOTE — Telephone Encounter (Signed)
Dr Roda Shutters last office notes suggest CBG to be done three times per day before meals. Faxed to [facility] pharmacy.

## 2012-09-07 NOTE — Addendum Note (Signed)
Addended by: Hulan Fray on: 09/07/2012 06:16 PM   Modules accepted: Orders

## 2012-10-03 ENCOUNTER — Encounter: Payer: Self-pay | Admitting: Gastroenterology

## 2012-10-10 ENCOUNTER — Encounter: Payer: Self-pay | Admitting: Gastroenterology

## 2012-10-10 ENCOUNTER — Ambulatory Visit (INDEPENDENT_AMBULATORY_CARE_PROVIDER_SITE_OTHER): Payer: Self-pay | Admitting: Gastroenterology

## 2012-10-10 VITALS — BP 180/100 | HR 97 | Ht 73.0 in | Wt 298.8 lb

## 2012-10-10 DIAGNOSIS — K625 Hemorrhage of anus and rectum: Secondary | ICD-10-CM

## 2012-10-10 DIAGNOSIS — K649 Unspecified hemorrhoids: Secondary | ICD-10-CM

## 2012-10-10 DIAGNOSIS — Z860100 Personal history of colon polyps, unspecified: Secondary | ICD-10-CM | POA: Insufficient documentation

## 2012-10-10 DIAGNOSIS — Z800 Family history of malignant neoplasm of digestive organs: Secondary | ICD-10-CM

## 2012-10-10 MED ORDER — HYDROCORTISONE ACETATE 25 MG RE SUPP: 25.0000 mg | Freq: Two times a day (BID) | RECTAL | Status: DC

## 2012-10-10 MED ORDER — NA SULFATE-K SULFATE-MG SULF 17.5-3.13-1.6 GM/177ML PO SOLN: 1.0000 | Freq: Once | ORAL | Status: DC

## 2012-10-10 NOTE — Patient Instructions (Addendum)
You have been given a separate informational sheet regarding your tobacco use, the importance of quitting and local resources to help you quit. You have been scheduled for a colonoscopy with propofol. Please follow written instructions given to you at your visit today.  Please pick up your prep kit at the pharmacy within the next 1-3 days. If you use inhalers (even only as needed) or a CPAP machine, please bring them with you on the day of your procedure.  Pick up your Suprep kit from your pharmacy

## 2012-10-10 NOTE — Assessment & Plan Note (Signed)
I suspect limited rectal bleeding is 2 to hemorrhoids. Plan suppositories therapy.

## 2012-10-10 NOTE — Assessment & Plan Note (Signed)
Rectal pain and bleeding are most likely due to hemorrhoids.  Recommendations #1 Anusol HC suppositories

## 2012-10-10 NOTE — Progress Notes (Signed)
History of Present Illness: Pleasant 47 year old white male with diabetes, history of colon polyps, family history of colon cancer,  referred at the request of Dr. Eulas Post for evaluation of rectal pain and bleeding. Pain and bleeding have been a chronic intermittent problem for years. Pain tends to be on the inside of the rectum and not necessarily associated with a bowel movement. Bleeding may occur one to 2 times a week consisting of bright red blood in the toilet water. Father has colon cancer. He apparently underwent colonoscopy and polypectomy approximately 5 years ago and was told to repeat the procedure 5 years hence.    Past Medical History  Diagnosis Date  . Diabetes mellitus   . Hypertension   . Hyperlipidemia   . Meralgia paraesthetica 05/03/2012  . Lumbar spondylosis 05/03/2012  . Lumbar spinal stenosis 05/03/2012    Mild with only right L4 nerve root encroachment, no neurogenic claudication   . 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 Urinary incontinence 05/12/2012    Since the start of Sept. 2013   . Hemorrhoids 05/12/2012  . UTI (lower urinary tract infection)   . Meralgia paraesthetica 05/03/2012    On Lyrica which does improve pain.   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 Substance abuse    Past Surgical History  Procedure Laterality Date  . Tonsillectomy    . Abdominal surgery      Abscess I&D 2/2 infected hair  . Colonoscopy w/ polypectomy      pt to bring records   family history includes Alcohol abuse in his father and Cancer in his father. Current Outpatient Prescriptions  Medication Sig Dispense Refill  . amitriptyline (ELAVIL) 50 MG tablet Take 1 tablet (50 mg total) by mouth at bedtime.  30 tablet  3  . amLODipine (NORVASC) 5 MG tablet Take 1 tablet (5 mg total) by mouth daily.  30 tablet  3  . chlorthalidone (HYGROTON) 25 MG tablet Take 1 tablet (25 mg total) by mouth daily.  30 tablet  11  . cyclobenzaprine (FLEXERIL) 10 MG tablet Take 1 tablet (10 mg  total) by mouth 3 (three) times daily as needed.  30 tablet  1  . glucose blood (FREESTYLE TEST STRIPS) test strip Use as instructed to check your blood sugar levels 3 times a day before meals.  ICD-9 250.02 (Type 2 diabetes mellitus, on insulin).  100 each  1  . glucose monitoring kit (FREESTYLE) monitoring kit Check your blood sugar levels 3 times a day before meals.  ICD-9 250.02 (Type 2 diabetes mellitus, on insulin).  1 each  0  . insulin aspart (NOVOLOG) 100 UNIT/ML injection Inject 15 Units into the skin 3 (three) times daily before meals.  10 mL  11  . insulin glargine (LANTUS) 100 UNIT/ML injection Inject 40 Units into the skin 2 (two) times daily.  15 mL  11  . Lancets (FREESTYLE) lancets Use as instructed  100 each  12  . lisinopril (PRINIVIL,ZESTRIL) 40 MG tablet Take 1 tablet (40 mg total) by mouth daily.  30 tablet  11  . metoprolol (TOPROL-XL) 200 MG 24 hr tablet Take 1 tablet (200 mg total) by mouth daily.  30 tablet  11  . omega-3 acid ethyl esters (LOVAZA) 1 G capsule Take 2 capsules (2 g total) by mouth 2 (two) times daily.  60 capsule  11  . pravastatin (PRAVACHOL) 40 MG tablet Take 1 tablet (40 mg total) by mouth every evening.  30 tablet  11  .  pregabalin (LYRICA) 100 MG capsule Take 2 capsules (200 mg total) by mouth 3 (three) times daily.  90 capsule  1  . Syringe, Disposable, 1 ML MISC 1 Syringe by Does not apply route 3 (three) times daily.  100 each  11  . terazosin (HYTRIN) 1 MG capsule Take 1 capsule (1 mg total) by mouth at bedtime.  30 capsule  2   No current facility-administered medications for this visit.   Allergies as of 10/10/2012  . (No Known Allergies)    reports that he has been smoking Cigarettes.  He has been smoking about 0.70 packs per day. He has never used smokeless tobacco. He reports that he drinks about 7.2 ounces of alcohol per week. His drug history is not on file.     Review of Systems: He suffers from chronic back pain Pertinent positive  and negative review of systems were noted in the above HPI section. All other review of systems were otherwise negative.  Vital signs were reviewed in today's medical record Physical Exam: General: Well developed , well nourished, no acute distress Skin: anicteric Head: Normocephalic and atraumatic Eyes:  sclerae anicteric, EOMI Ears: Normal auditory acuity Mouth: No deformity or lesions Neck: Supple, no masses or thyromegaly Lungs: Clear throughout to auscultation Heart: Regular rate and rhythm; no murmurs, rubs or bruits Abdomen: Soft, non tender and non distended. No masses, hepatosplenomegaly or hernias noted. Normal Bowel sounds Rectal: There are no external rectal lesions Musculoskeletal: Symmetrical with no gross deformities  Skin: No lesions on visible extremities Pulses:  Normal pulses noted Extremities: No clubbing, cyanosis, edema or deformities noted Neurological: Alert oriented x 4, grossly nonfocal Cervical Nodes:  No significant cervical adenopathy Inguinal Nodes: No significant inguinal adenopathy Psychological:  Alert and cooperative. Normal mood and affect

## 2012-10-10 NOTE — Assessment & Plan Note (Signed)
Plan followup colonoscopy 

## 2012-10-13 ENCOUNTER — Other Ambulatory Visit: Payer: No Typology Code available for payment source

## 2012-10-21 ENCOUNTER — Telehealth: Payer: Self-pay | Admitting: Gastroenterology

## 2012-10-21 ENCOUNTER — Encounter: Payer: Self-pay | Admitting: Gastroenterology

## 2012-10-24 NOTE — Telephone Encounter (Signed)
Yes

## 2012-11-11 ENCOUNTER — Ambulatory Visit: Payer: Self-pay

## 2012-11-11 ENCOUNTER — Other Ambulatory Visit: Payer: Self-pay

## 2012-11-18 ENCOUNTER — Ambulatory Visit: Payer: Self-pay

## 2012-11-18 ENCOUNTER — Other Ambulatory Visit (INDEPENDENT_AMBULATORY_CARE_PROVIDER_SITE_OTHER): Payer: Self-pay

## 2012-11-18 DIAGNOSIS — E1149 Type 2 diabetes mellitus with other diabetic neurological complication: Secondary | ICD-10-CM

## 2012-11-18 DIAGNOSIS — E1142 Type 2 diabetes mellitus with diabetic polyneuropathy: Secondary | ICD-10-CM

## 2012-11-18 LAB — BASIC METABOLIC PANEL WITH GFR
BUN: 22 mg/dL (ref 6–23)
Chloride: 102 mEq/L (ref 96–112)
GFR, Est Non African American: 77 mL/min
Potassium: 4.8 mEq/L (ref 3.5–5.3)

## 2012-12-06 ENCOUNTER — Encounter: Payer: Self-pay | Admitting: Internal Medicine

## 2012-12-06 DIAGNOSIS — R269 Unspecified abnormalities of gait and mobility: Secondary | ICD-10-CM | POA: Insufficient documentation

## 2012-12-23 ENCOUNTER — Ambulatory Visit: Payer: Self-pay | Admitting: Internal Medicine

## 2012-12-27 ENCOUNTER — Encounter: Payer: Self-pay | Admitting: Internal Medicine

## 2012-12-27 DIAGNOSIS — E1142 Type 2 diabetes mellitus with diabetic polyneuropathy: Secondary | ICD-10-CM | POA: Insufficient documentation

## 2013-01-12 ENCOUNTER — Encounter (HOSPITAL_COMMUNITY): Payer: Self-pay | Admitting: *Deleted

## 2013-01-12 ENCOUNTER — Emergency Department (HOSPITAL_COMMUNITY)
Admission: EM | Admit: 2013-01-12 | Discharge: 2013-01-13 | Disposition: A | Discharge status: Home / Self Care | Payer: BC Managed Care – PPO | Attending: Emergency Medicine | Admitting: Emergency Medicine

## 2013-01-12 ENCOUNTER — Emergency Department (HOSPITAL_COMMUNITY): Payer: BC Managed Care – PPO

## 2013-01-12 DIAGNOSIS — E785 Hyperlipidemia, unspecified: Secondary | ICD-10-CM | POA: Insufficient documentation

## 2013-01-12 DIAGNOSIS — E1142 Type 2 diabetes mellitus with diabetic polyneuropathy: Secondary | ICD-10-CM | POA: Insufficient documentation

## 2013-01-12 DIAGNOSIS — Z87440 Personal history of urinary (tract) infections: Secondary | ICD-10-CM | POA: Insufficient documentation

## 2013-01-12 DIAGNOSIS — R079 Chest pain, unspecified: Secondary | ICD-10-CM | POA: Insufficient documentation

## 2013-01-12 DIAGNOSIS — F17200 Nicotine dependence, unspecified, uncomplicated: Secondary | ICD-10-CM | POA: Insufficient documentation

## 2013-01-12 DIAGNOSIS — Z79899 Other long term (current) drug therapy: Secondary | ICD-10-CM | POA: Insufficient documentation

## 2013-01-12 DIAGNOSIS — Z794 Long term (current) use of insulin: Secondary | ICD-10-CM | POA: Insufficient documentation

## 2013-01-12 DIAGNOSIS — J189 Pneumonia, unspecified organism: Secondary | ICD-10-CM

## 2013-01-12 DIAGNOSIS — Z8739 Personal history of other diseases of the musculoskeletal system and connective tissue: Secondary | ICD-10-CM | POA: Insufficient documentation

## 2013-01-12 DIAGNOSIS — E1149 Type 2 diabetes mellitus with other diabetic neurological complication: Secondary | ICD-10-CM | POA: Insufficient documentation

## 2013-01-12 DIAGNOSIS — Z87448 Personal history of other diseases of urinary system: Secondary | ICD-10-CM | POA: Insufficient documentation

## 2013-01-12 DIAGNOSIS — R5381 Other malaise: Secondary | ICD-10-CM | POA: Insufficient documentation

## 2013-01-12 DIAGNOSIS — R05 Cough: Secondary | ICD-10-CM | POA: Insufficient documentation

## 2013-01-12 DIAGNOSIS — R059 Cough, unspecified: Secondary | ICD-10-CM | POA: Insufficient documentation

## 2013-01-12 DIAGNOSIS — Z8719 Personal history of other diseases of the digestive system: Secondary | ICD-10-CM | POA: Insufficient documentation

## 2013-01-12 DIAGNOSIS — I10 Essential (primary) hypertension: Secondary | ICD-10-CM | POA: Insufficient documentation

## 2013-01-12 LAB — BASIC METABOLIC PANEL
CO2: 28 mEq/L (ref 19–32)
Chloride: 104 mEq/L (ref 96–112)
Sodium: 141 mEq/L (ref 135–145)

## 2013-01-12 LAB — CBC
Hemoglobin: 15 g/dL (ref 13.0–17.0)
Platelets: 163 10*3/uL (ref 150–400)
RBC: 4.96 MIL/uL (ref 4.22–5.81)
WBC: 8.1 10*3/uL (ref 4.0–10.5)

## 2013-01-12 LAB — POCT I-STAT TROPONIN I: Troponin i, poc: 0.04 ng/mL (ref 0.00–0.08)

## 2013-01-12 LAB — PRO B NATRIURETIC PEPTIDE: Pro B Natriuretic peptide (BNP): 664.6 pg/mL — ABNORMAL HIGH (ref 0–125)

## 2013-01-12 MED ORDER — IPRATROPIUM BROMIDE 0.02 % IN SOLN
0.5000 mg | Freq: Once | RESPIRATORY_TRACT | Status: AC
  Administered 2013-01-13: 0.5 mg via RESPIRATORY_TRACT
  Filled 2013-01-12: qty 2.5

## 2013-01-12 MED ORDER — ALBUTEROL SULFATE (5 MG/ML) 0.5% IN NEBU
2.5000 mg | INHALATION_SOLUTION | Freq: Once | RESPIRATORY_TRACT | Status: AC
  Administered 2013-01-13: 2.5 mg via RESPIRATORY_TRACT
  Filled 2013-01-12: qty 0.5

## 2013-01-12 NOTE — ED Notes (Signed)
Patient reports sudden SOB about two days ago.  Patient states the pain has gradually increased and feels like a pressure.  Patient reports history of an enlarged heart.  Patient denies cough or congestion.

## 2013-01-12 NOTE — ED Notes (Signed)
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

## 2013-01-12 NOTE — ED Provider Notes (Signed)
History     CSN: [id number]  Arrival date & time 01/12/13  2052   First MD Initiated Contact with Patient 01/12/13 2140      Chief Complaint  Patient presents with  . Chest Pain  . Shortness of Breath    (Consider location/radiation/quality/duration/timing/severity/associated sxs/prior treatment) HPI Comments: 47 y.o. Male with PMHx of insulin depend DM2, cardiomegaly, HTN, HLD presents complaining of chest pain and shortness of breath onset 3 days ago when it woke him from sleep, hard to take a breath, and progressively worsening. Pt states he feels short of breath both at rest and exertion, though worse on exertion. Pt describes chest pain as a pressure, localized to the center of his chest, non pleuritic, non reproduceable. Pt states he is compliant with his insulin. Admits occasional LE edema, difficulty with seasonal allergies. Admits mild dry cough, malaise. Denies nausea, vomiting, headache, visual disturbances, calf pain. Pt denies hx of clots, history of travel, immobilization, surgery, fevers, cancer.  Pt has 15 year pack hx.   Patient is a 47 y.o. male presenting with chest pain and shortness of breath.  Chest Pain Associated symptoms: cough, fatigue and shortness of breath   Associated symptoms: no diaphoresis, no dizziness, no fever, no headache, no nausea, no numbness, no palpitations, not vomiting and no weakness   Shortness of Breath Associated symptoms: chest pain and cough   Associated symptoms: no diaphoresis, no fever, no headaches, no neck pain, no rash and no vomiting     Past Medical History  Diagnosis Date  . Diabetes mellitus   . Hypertension   . Hyperlipidemia   . Meralgia paraesthetica 05/03/2012  . Lumbar spondylosis 05/03/2012  . Lumbar spinal stenosis 05/03/2012    Mild with only right L4 nerve root encroachment, no neurogenic claudication   . 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 Urinary incontinence 05/12/2012    Since the start of Sept. 2013    . Hemorrhoids 05/12/2012  . UTI (lower urinary tract infection)   . Meralgia paraesthetica 05/03/2012    On Lyrica which does improve pain.   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 Substance abuse   . Hyperlipidemia     Past Surgical History  Procedure Laterality Date  . Tonsillectomy    . Abdominal surgery      Abscess I&D 2/2 infected hair  . Colonoscopy w/ polypectomy      pt to bring records    Family History  Problem Relation Age of Onset  . Cancer Father     Colon cancer- in remission  . Alcohol abuse Father     History  Substance Use Topics  . Smoking status: Current Every Day Smoker -- 0.70 packs/day    Types: Cigarettes  . Smokeless tobacco: Never Used  . Alcohol Use: 7.2 oz/week    12 Cans of beer per week     Comment: occasionally      Review of Systems  Constitutional: Positive for fatigue. Negative for fever and diaphoresis.  HENT: Negative for neck pain and neck stiffness.   Eyes: Negative for visual disturbance.  Respiratory: Positive for cough and shortness of breath. Negative for apnea and chest tightness.   Cardiovascular: Positive for chest pain. Negative for palpitations.  Gastrointestinal: Negative for nausea, vomiting, diarrhea and constipation.  Genitourinary: Negative for dysuria.  Musculoskeletal: Negative for gait problem.  Skin: Negative for rash.  Neurological: Negative for dizziness, weakness, light-headedness, numbness and headaches.    Allergies  Review of patient's allergies indicates  no known allergies.  Home Medications   Current Outpatient Rx  Name  Route  Sig  Dispense  Refill  . amLODipine (NORVASC) 5 MG tablet   Oral   Take 5 mg by mouth daily.         . chlorthalidone (HYGROTON) 25 MG tablet   Oral   Take 25 mg by mouth daily.         . insulin aspart (NOVOLOG) 100 UNIT/ML injection   Subcutaneous   Inject 20 Units into the skin 3 (three) times daily with meals.         . insulin glargine (LANTUS) 100  UNIT/ML injection   Subcutaneous   Inject 40 Units into the skin 2 (two) times daily.   15 mL   11     Please fill in PEN form, not VIAL form.   Marland Kitchen lisinopril (PRINIVIL,ZESTRIL) 40 MG tablet   Oral   Take 1 tablet (40 mg total) by mouth daily.   30 tablet   11   . metoprolol (TOPROL-XL) 200 MG 24 hr tablet   Oral   Take 1 tablet (200 mg total) by mouth daily.   30 tablet   11   . omega-3 acid ethyl esters (LOVAZA) 1 G capsule   Oral   Take 2 capsules (2 g total) by mouth 2 (two) times daily.   60 capsule   11   . pregabalin (LYRICA) 100 MG capsule   Oral   Take 100 mg by mouth 2 (two) times daily.         Marland Kitchen terazosin (HYTRIN) 1 MG capsule   Oral   Take 1 capsule (1 mg total) by mouth at bedtime.   30 capsule   2     BP 175/109  Pulse 80  Temp(Src) 99.6 F (37.6 C) (Oral)  Resp 20  Ht 6\' 1"  (1.854 m)  Wt 298 lb (135.172 kg)  BMI 39.32 kg/m2  SpO2 96%  Physical Exam  Nursing note and vitals reviewed. Constitutional: He is oriented to person, place, and time. He appears well-developed and well-nourished. No distress.  HENT:  Head: Normocephalic and atraumatic.  Eyes: Conjunctivae and EOM are normal.  Neck: Normal range of motion. Neck supple.  No meningeal signs  Cardiovascular: Normal rate, regular rhythm and normal heart sounds.  Exam reveals no gallop and no friction rub.   No murmur heard. Pulmonary/Chest: No respiratory distress. He has wheezes. He has rales. He exhibits no tenderness.  Pt working to breath, diminished lung sounds bilaterally, slight rhonchi on right  Abdominal: Soft. Bowel sounds are normal. He exhibits no distension. There is no tenderness. There is no rebound and no guarding.  Musculoskeletal: Normal range of motion. He exhibits edema. He exhibits no tenderness.  +1 LE bilateral edema  Neurological: He is alert and oriented to person, place, and time. No cranial nerve deficit.  Skin: Skin is warm and dry. He is not diaphoretic. No  erythema.  Psychiatric: He has a normal mood and affect.    ED Course  Procedures (including critical care time)  Labs Reviewed  CBC  BASIC METABOLIC PANEL  PRO B NATRIURETIC PEPTIDE  POCT I-STAT TROPONIN I   Dg Chest 1 View  01/12/2013   *RADIOLOGY REPORT*  Clinical Data: Chest pain, shortness of breath.  Question pneumonia.  CHEST - 1 VIEW  Comparison: Portable AP view of the chest earlier.  Findings: Diffuse peribronchial thickening and interstitial prominence are noted in the lungs as seen  on frontal view.  Mild cardiomegaly.  No confluent opacities or effusions.  The  IMPRESSION: Bronchitic changes, cardiomegaly.  Stable diffuse interstitial prominence as previous.   Original Report Authenticated By: Rolm Baptise, M.D.   Dg Chest Port 1 View  01/12/2013   *RADIOLOGY REPORT*  Clinical Data: Chest pain, shortness of breath.  PORTABLE CHEST - 1 VIEW  Comparison: 03/05/2010  Findings: Cardiomegaly.  Diffuse peribronchial thickening. Interstitial prominence could be related to edema or atypical infection.  No effusions.  No acute bony abnormality.  IMPRESSION: Bronchitic changes.  Cardiomegaly.  Diffuse interstitial prominence, question edema versus atypical/viral infection.   Original Report Authenticated By: Rolm Baptise, M.D.   Medications  albuterol (PROVENTIL) (5 MG/ML) 0.5% nebulizer solution 2.5 mg (2.5 mg Nebulization Given 01/13/13 0000)  ipratropium (ATROVENT) nebulizer solution 0.5 mg (0.5 mg Nebulization Given 01/13/13 0000)  albuterol (PROVENTIL HFA;VENTOLIN HFA) 108 (90 BASE) MCG/ACT inhaler 2 puff (2 puffs Inhalation Given 01/13/13 0026)     Discharge Medication List as of 01/13/2013 12:33 AM    START taking these medications   Details  albuterol (PROVENTIL HFA;VENTOLIN HFA) 108 (90 BASE) MCG/ACT inhaler Inhale 2 puffs into the lungs every 4 (four) hours as needed for wheezing or shortness of breath., Starting 01/13/2013, Until Discontinued, Print    azithromycin (ZITHROMAX  Z-PAK) 250 MG tablet Take 1 tablet (250 mg total) by mouth daily. 500mg  PO day 1, then 250mg  PO days 205, Starting 01/13/2013, Until Discontinued, Print        1. Community acquired pneumonia   2. Hypertension       MDM  No hx of coronary dz in an insulin dependent DM2.  Diminished breath sounds on exam, however hear slight rhonchi on right. Possibility of CAP. Will work up ACS given sx and risk factors. Suspicion for PE is low. Not concerning for pneumothorax or aortic dissection. Will order neb to open him up to get a better listen. Pt states he takes many meds for his hypertension and has a hard time controlling it. Pt is asymptomatic denying headache, visual disturbances, dizziness.   Protocol orders were just for portable CXR.  Will get 2nd view.   Pt breathing easier after neb and inhaler. Heard diffuse rales/rhonchi on re-evaluation. Pt has no hx of asthma, but does have hx of pneumonia. Cardiac workup negative. Will treat for CAP.   Directed pt to call his primary care doctor in the morning to discuss his elevated BP and BNP results. Pt states he did not take his BP meds today, so directed pt to take them when he got home, taking 2 Norvasc.  Prescribed Z Pack and discussed return precautions. Pt states he understands diagnosis and is agreeable to discharge. Discussed pt care plan with Dr. Betsey Holiday who agrees with plan to discharge.    Coralee North, PA-C 01/13/13 [phone]

## 2013-01-12 NOTE — ED Notes (Signed)
EKG old and new given to EDP, Betsey Holiday, MD.

## 2013-01-13 ENCOUNTER — Other Ambulatory Visit: Payer: Self-pay | Admitting: Internal Medicine

## 2013-01-13 MED ORDER — ALBUTEROL SULFATE HFA 108 (90 BASE) MCG/ACT IN AERS: 2.0000 | INHALATION_SPRAY | RESPIRATORY_TRACT | Status: DC | PRN

## 2013-01-13 MED ORDER — ALBUTEROL SULFATE HFA 108 (90 BASE) MCG/ACT IN AERS
2.0000 | INHALATION_SPRAY | Freq: Once | RESPIRATORY_TRACT | Status: AC
  Administered 2013-01-13: 2 via RESPIRATORY_TRACT
  Filled 2013-01-13: qty 6.7

## 2013-01-13 MED ORDER — AZITHROMYCIN 250 MG PO TABS: 250.0000 mg | ORAL_TABLET | Freq: Every day | ORAL | Status: DC

## 2013-01-15 NOTE — ED Provider Notes (Signed)
Medical screening examination/treatment/procedure(s) were performed by non-physician practitioner and as supervising physician I was immediately available for consultation/collaboration.   Orpah Greek, MD 01/15/13 [phone]

## 2013-01-17 ENCOUNTER — Encounter: Payer: Self-pay | Admitting: Internal Medicine

## 2013-01-17 ENCOUNTER — Ambulatory Visit (INDEPENDENT_AMBULATORY_CARE_PROVIDER_SITE_OTHER): Payer: BC Managed Care – PPO | Admitting: Internal Medicine

## 2013-01-17 ENCOUNTER — Ambulatory Visit (HOSPITAL_COMMUNITY)
Admission: RE | Admit: 2013-01-17 | Discharge: 2013-01-17 | Disposition: A | Discharge status: Home / Self Care | Payer: BC Managed Care – PPO | Source: Ambulatory Visit | Attending: Internal Medicine | Admitting: Internal Medicine

## 2013-01-17 VITALS — BP 206/106 | HR 70 | Temp 97.8°F | Ht 73.0 in | Wt 302.0 lb

## 2013-01-17 DIAGNOSIS — R0789 Other chest pain: Secondary | ICD-10-CM | POA: Insufficient documentation

## 2013-01-17 DIAGNOSIS — L918 Other hypertrophic disorders of the skin: Secondary | ICD-10-CM

## 2013-01-17 DIAGNOSIS — L909 Atrophic disorder of skin, unspecified: Secondary | ICD-10-CM

## 2013-01-17 DIAGNOSIS — R9431 Abnormal electrocardiogram [ECG] [EKG]: Secondary | ICD-10-CM | POA: Insufficient documentation

## 2013-01-17 DIAGNOSIS — IMO0001 Reserved for inherently not codable concepts without codable children: Secondary | ICD-10-CM

## 2013-01-17 DIAGNOSIS — R0989 Other specified symptoms and signs involving the circulatory and respiratory systems: Secondary | ICD-10-CM

## 2013-01-17 DIAGNOSIS — Z794 Long term (current) use of insulin: Secondary | ICD-10-CM

## 2013-01-17 DIAGNOSIS — R0609 Other forms of dyspnea: Secondary | ICD-10-CM | POA: Insufficient documentation

## 2013-01-17 DIAGNOSIS — E11319 Type 2 diabetes mellitus with unspecified diabetic retinopathy without macular edema: Secondary | ICD-10-CM

## 2013-01-17 DIAGNOSIS — R0600 Dyspnea, unspecified: Secondary | ICD-10-CM | POA: Insufficient documentation

## 2013-01-17 DIAGNOSIS — L919 Hypertrophic disorder of the skin, unspecified: Secondary | ICD-10-CM

## 2013-01-17 DIAGNOSIS — E1139 Type 2 diabetes mellitus with other diabetic ophthalmic complication: Secondary | ICD-10-CM

## 2013-01-17 DIAGNOSIS — I10 Essential (primary) hypertension: Secondary | ICD-10-CM

## 2013-01-17 LAB — POCT GLYCOSYLATED HEMOGLOBIN (HGB A1C): Hemoglobin A1C: 7.4

## 2013-01-17 LAB — BASIC METABOLIC PANEL WITH GFR
Calcium: 9.2 mg/dL (ref 8.4–10.5)
Creat: 1.19 mg/dL (ref 0.50–1.35)
GFR, Est African American: 84 mL/min
GFR, Est Non African American: 72 mL/min

## 2013-01-17 LAB — GLUCOSE, CAPILLARY: Glucose-Capillary: 374 mg/dL — ABNORMAL HIGH (ref 70–99)

## 2013-01-17 LAB — TSH: TSH: 2.903 u[IU]/mL (ref 0.350–4.500)

## 2013-01-17 MED ORDER — FUROSEMIDE 20 MG PO TABS: 20.0000 mg | ORAL_TABLET | Freq: Every day | ORAL | Status: DC

## 2013-01-17 MED ORDER — METOPROLOL TARTRATE 100 MG PO TABS: 100.0000 mg | ORAL_TABLET | Freq: Two times a day (BID) | ORAL | Status: DC

## 2013-01-17 MED ORDER — PREGABALIN 100 MG PO CAPS: 100.0000 mg | ORAL_CAPSULE | Freq: Two times a day (BID) | ORAL | Status: DC

## 2013-01-17 MED ORDER — LISINOPRIL 40 MG PO TABS: 40.0000 mg | ORAL_TABLET | Freq: Every day | ORAL | Status: DC

## 2013-01-17 MED ORDER — AMITRIPTYLINE HCL 50 MG PO TABS: ORAL_TABLET | ORAL | Status: DC

## 2013-01-17 NOTE — Progress Notes (Signed)
Patient ID: Jerry Cantrell, male   DOB: 10/12/1965, 47 y.o.   MRN: [id number] Internal Medicine Clinic Visit    HPI:  Jerry Cantrell is a 47 y.o. year old male history of diabetes, retinopathy, neuropathy, hypertension, hyperlipidemia. He presents to the clinic as an ED followup.  Jerry Cantrell was recently seen on the ED on 01/12/2013 when he presented with chest pain and shortness of breath. He was diagnosed with community-acquired pneumonia and given a Z-Pak and albuterol. His blood pressure was elevated to 175/109 in the ED.   Patient states that the Z-Pak and albuterol inhalers have not helped at all. He continues to be short of breath and with occasional chest pain. He states that this problem started on Monday when he woke up at 5 AM gasping for breath. Since then, he has had several episodes of waking up at night bili like he could not breathe. He has some associated chest pain located in the sternum, feeling tight. The first was constant, but now comes and goes. He states that the chest pain and shortness of breath are much worse with exertion. He does endorse some swelling in his legs but this has been over the past year or so. He has had a cough but no hemoptysis. Patient also states that he has been feeling more fatigued than usual over the past month. Chest pain is mild to moderate, mostly 2/10.  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 He states that the heat on the food line makes the chest pain and shortness of breath worse. 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 He also reports intolerance of laying flat, although this has come on over the past several months. He sleeps with 2 pillows to prop his head up pretty high.  Regarding his medicines, patient has had some trouble affording his medications recently. He asked [place] to change everything to  the $4 list at [facility] as he cannot afford them as they are right now. For his blood pressure, he is currently only taking Toprol XL 200 mg twice a day (although it is only written for once daily), along with drowsiness and. He cannot afford to take Norvasc so he has not been taking that, and he ran out of his chlorthalidone recently.  Regarding his diabetes, patient states that he is very compliant with his insulin regimen which includes 40 mg of Lantus twice a day as well as sliding scale NovoLog pen for mealtime. He does state that he uses the pen sometimes more than 3 meals per day depending on how high his blood sugar is. He also states that he does not check his blood sugar very often. He did not bring his meter for review today.  He continues to have leg pain at night which keeps him from sleeping very well. He does not feel like he gets restful sleep. He does not know if he snores and he is unsure of whether he stops breathing at night. He does feel like his throat swells up at times. He never had a sleep study before.  Smokes 6 cigarettes per day. Has a few beers at night to help fall asleep.      Past Medical History  Diagnosis Date  . Diabetes mellitus   . Hypertension   . Hyperlipidemia   . Meralgia paraesthetica 05/03/2012  .  Lumbar spondylosis 05/03/2012  . Lumbar spinal stenosis 05/03/2012    Mild with only right L4 nerve root encroachment, no neurogenic claudication   . 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 Urinary incontinence 05/12/2012    Since the start of Sept. 2013   . Hemorrhoids 05/12/2012  . UTI (lower urinary tract infection)   . Meralgia paraesthetica 05/03/2012    On Lyrica which does improve pain.   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 Substance abuse   . Hyperlipidemia     Past Surgical History  Procedure Laterality Date  . Tonsillectomy    . Abdominal surgery      Abscess I&D 2/2 infected hair  . Colonoscopy w/ polypectomy      pt to bring records      ROS:  A complete review of systems was otherwise negative, except as noted in the HPI.  Allergies: Review of patient's allergies indicates no known allergies.  Medications: Current Outpatient Prescriptions  Medication Sig Dispense Refill  . albuterol (PROVENTIL HFA;VENTOLIN HFA) 108 (90 BASE) MCG/ACT inhaler Inhale 2 puffs into the lungs every 4 (four) hours as needed for wheezing or shortness of breath.  1 Inhaler  3  . amitriptyline (ELAVIL) 50 MG tablet TAKE ONE TABLET BY MOUTH AT BEDTIME  30 tablet  0  . amLODipine (NORVASC) 5 MG tablet Take 5 mg by mouth daily.      Marland Kitchen azithromycin (ZITHROMAX Z-PAK) 250 MG tablet Take 1 tablet (250 mg total) by mouth daily. 500mg  PO day 1, then 250mg  PO days 205  6 tablet  0  . chlorthalidone (HYGROTON) 25 MG tablet Take 25 mg by mouth daily.      . insulin aspart (NOVOLOG) 100 UNIT/ML injection Inject 20 Units into the skin 3 (three) times daily with meals.      . insulin glargine (LANTUS) 100 UNIT/ML injection Inject 40 Units into the skin 2 (two) times daily.  15 mL  11  . lisinopril (PRINIVIL,ZESTRIL) 40 MG tablet Take 1 tablet (40 mg total) by mouth daily.  30 tablet  11  . metoprolol (TOPROL-XL) 200 MG 24 hr tablet Take 1 tablet (200 mg total) by mouth daily.  30 tablet  11  . omega-3 acid ethyl esters (LOVAZA) 1 G capsule Take 2 capsules (2 g total) by mouth 2 (two) times daily.  60 capsule  11  . pregabalin (LYRICA) 100 MG capsule Take 100 mg by mouth 2 (two) times daily.      Marland Kitchen terazosin (HYTRIN) 1 MG capsule TAKE ONE CAPSULE BY MOUTH AT BEDTIME  30 capsule  0   No current facility-administered medications for this visit.    History   Social History  . Marital Status: Single    Spouse Name: N/A    Number of Children: N/A  . Years of Education: N/A   Occupational History  . dietary services [employer]   Social History Main Topics  . Smoking status: Current Every Day Smoker -- 0.70 packs/day    Types: Cigarettes  . Smokeless  tobacco: Never Used  . Alcohol Use: 7.2 oz/week    12 Cans of beer per week     Comment: occasionally  . Drug Use: Not on file  . Sexually Active: Not on file     Comment: Oral sex only   Other Topics Concern  . Not on file   Social History Narrative  . No narrative on file    family history includes Alcohol abuse in  his father and Cancer in his father.  Physical Exam Blood pressure 206/106, pulse 70, temperature 97.8 F (36.6 C), temperature source Oral, height 6\' 1"  (1.854 m), weight 302 lb (136.986 kg), SpO2 94.00%. General:  No acute distress, obese, alert and oriented x 3 HEENT:  PERRL, EOMI, no lymphadenopathy, moist mucous membranes, oropharynx is quite cramped, no lesions, but on his fairly large and I cannot visualize the uvula Cardiovascular:  Regular rate and rhythm, no murmurs Respiratory:  Clear to auscultation, no wheezes, I do not hear any crackles, and he is moving pretty good air Abdomen:  Soft, obese, nondistended, nontender, positive bowel sounds Extremities:  Warm and well-perfused, 1+ edema bilaterally, nonpitting Skin: Warm, dry, no rashes Neuro: Not anxious appearing, no depressed mood, normal affect  Labs: Lab Results  Component Value Date   CREATININE 1.38* 01/12/2013   BUN 21 01/12/2013   NA 141 01/12/2013   K 4.0 01/12/2013   CL 104 01/12/2013   CO2 28 01/12/2013   Lab Results  Component Value Date   WBC 8.1 01/12/2013   HGB 15.0 01/12/2013   HCT 44.9 01/12/2013   MCV 90.5 01/12/2013   PLT 163 01/12/2013      Assessment and Plan:    FOLLOWUP: Jerry Cantrell will follow back up in our clinic in approximately 2-3 days. Sayge Mcdonnell knows to call out clinic in the meantime with any questions or new issues.   Plan was formulated and discussed with Dr. Stann Mainland, attending physician

## 2013-01-18 MED ORDER — INSULIN GLARGINE 100 UNIT/ML ~~LOC~~ SOLN: 40.0000 [IU] | Freq: Two times a day (BID) | SUBCUTANEOUS | Status: DC

## 2013-01-18 MED ORDER — INSULIN ASPART 100 UNIT/ML ~~LOC~~ SOLN: 20.0000 [IU] | Freq: Three times a day (TID) | SUBCUTANEOUS | Status: DC

## 2013-01-18 NOTE — Assessment & Plan Note (Signed)
Patient continues to have dyspnea on exertion concerning for heart failure along with chest pain. EKG from clinic today shows not much change from previous. He continues to have a long QRS but no clear pattern of bundle-branch blocks. No evidence of ischemia, no new T-wave inversions, no ST changes.  -Echocardiogram -Will start Lasix 20 mg daily -Continue lisinopril, change metoprolol to 100 mg twice a day

## 2013-01-18 NOTE — Assessment & Plan Note (Signed)
BP Readings from Last 3 Encounters:  01/17/13 206/106  01/12/13 175/109  10/10/12 180/100    Lab Results  Component Value Date   NA 140 01/17/2013   K 4.3 01/17/2013   CREATININE 1.19 01/17/2013    Assessment: Blood pressure control:   uncontrolled Progress toward BP goal:    deteriorated  Plan:  Patient continues to have poorly controlled hypertension. He states that he is compliant with lisinopril and Toprol, but he recently ran out of chlorthalidone.  -Discontinue chlorthalidone -Continue lisinopril 40 mg daily -Switched from Toprol XR to Lopressor 100 mg twice a day as this medication is more affordable for the patient -Start Lasix 20 mg daily -Return to clinic in 2 days for blood pressure check

## 2013-01-18 NOTE — Patient Instructions (Signed)
RTC 2-3 days  Fill your medications from your pharmacy.

## 2013-01-18 NOTE — Assessment & Plan Note (Signed)
Patient with bothersome skin tags on bilateral eyelids. He has 2 moderate sized lesions on his left eyelid that are visually significant and causing defects in his vision. There pushing his lid down causing his eyelashes to obstruct his vision causing patient to have to hold his eyelid up while driving. Patient did go to the dermatologist as Dr. Corky Crafts referred him back in December. He states that he paid $150 co-pay to get in to the clinic, however, he stated that they asked for additional money which he did not have with him that day since he did not inform him ahead of time that he would need to bring some extra. He was not able to get the skin tags removed with dermatology.  -I used small sterile scissors to cut the lashes short today so they would be less obstructive, will need to do this again in the future as his lashes grow back. -patient needs to get tags removed as they are causing vision problems

## 2013-01-18 NOTE — Assessment & Plan Note (Addendum)
Patient's last A1c was 7.4, however, he states that he cannot afford to check his blood sugar very often. He did not bring his glucometer or blood sugar log in for review today. He takes 40 units of Lantus twice daily, as well as sliding scale NovoLog with meals, usually 20 units.  -Continue current regimen for now -Patient will need to bring his glucometer and check a sugar more frequently -Financial constraints are challenging for the patient's compliance with blood sugar monitoring -needs eye exam at NEXT VISIT

## 2013-01-19 NOTE — Progress Notes (Signed)
Case discussed with Dr. Sissy Hoff (at time of visit, soon after the resident saw the patient).  We reviewed the resident's history and exam and pertinent patient test results.  I agree with the assessment, diagnosis, and plan of care documented in the resident's note.

## 2013-01-24 ENCOUNTER — Emergency Department (HOSPITAL_COMMUNITY): Payer: BC Managed Care – PPO

## 2013-01-24 ENCOUNTER — Inpatient Hospital Stay (HOSPITAL_COMMUNITY)
Admission: EM | Admit: 2013-01-24 | Discharge: 2013-01-30 | DRG: 584 | Disposition: A | Discharge status: Home / Self Care | Payer: BC Managed Care – PPO | Attending: Internal Medicine | Admitting: Internal Medicine

## 2013-01-24 ENCOUNTER — Encounter (HOSPITAL_COMMUNITY): Payer: Self-pay | Admitting: Emergency Medicine

## 2013-01-24 DIAGNOSIS — G5710 Meralgia paresthetica, unspecified lower limb: Secondary | ICD-10-CM | POA: Diagnosis present

## 2013-01-24 DIAGNOSIS — I499 Cardiac arrhythmia, unspecified: Secondary | ICD-10-CM

## 2013-01-24 DIAGNOSIS — N189 Chronic kidney disease, unspecified: Secondary | ICD-10-CM | POA: Diagnosis present

## 2013-01-24 DIAGNOSIS — D72829 Elevated white blood cell count, unspecified: Secondary | ICD-10-CM | POA: Diagnosis present

## 2013-01-24 DIAGNOSIS — E1142 Type 2 diabetes mellitus with diabetic polyneuropathy: Secondary | ICD-10-CM

## 2013-01-24 DIAGNOSIS — I129 Hypertensive chronic kidney disease with stage 1 through stage 4 chronic kidney disease, or unspecified chronic kidney disease: Secondary | ICD-10-CM | POA: Diagnosis present

## 2013-01-24 DIAGNOSIS — J4489 Other specified chronic obstructive pulmonary disease: Secondary | ICD-10-CM | POA: Diagnosis present

## 2013-01-24 DIAGNOSIS — E1149 Type 2 diabetes mellitus with other diabetic neurological complication: Secondary | ICD-10-CM

## 2013-01-24 DIAGNOSIS — F17200 Nicotine dependence, unspecified, uncomplicated: Secondary | ICD-10-CM

## 2013-01-24 DIAGNOSIS — R0989 Other specified symptoms and signs involving the circulatory and respiratory systems: Secondary | ICD-10-CM | POA: Diagnosis present

## 2013-01-24 DIAGNOSIS — IMO0002 Reserved for concepts with insufficient information to code with codable children: Secondary | ICD-10-CM

## 2013-01-24 DIAGNOSIS — N179 Acute kidney failure, unspecified: Secondary | ICD-10-CM | POA: Diagnosis present

## 2013-01-24 DIAGNOSIS — E11319 Type 2 diabetes mellitus with unspecified diabetic retinopathy without macular edema: Secondary | ICD-10-CM

## 2013-01-24 DIAGNOSIS — E785 Hyperlipidemia, unspecified: Secondary | ICD-10-CM

## 2013-01-24 DIAGNOSIS — R0609 Other forms of dyspnea: Secondary | ICD-10-CM | POA: Diagnosis present

## 2013-01-24 DIAGNOSIS — R079 Chest pain, unspecified: Secondary | ICD-10-CM

## 2013-01-24 DIAGNOSIS — R509 Fever, unspecified: Secondary | ICD-10-CM | POA: Diagnosis present

## 2013-01-24 DIAGNOSIS — IMO0001 Reserved for inherently not codable concepts without codable children: Secondary | ICD-10-CM

## 2013-01-24 DIAGNOSIS — R739 Hyperglycemia, unspecified: Secondary | ICD-10-CM

## 2013-01-24 DIAGNOSIS — I441 Atrioventricular block, second degree: Secondary | ICD-10-CM | POA: Diagnosis present

## 2013-01-24 DIAGNOSIS — J449 Chronic obstructive pulmonary disease, unspecified: Secondary | ICD-10-CM | POA: Diagnosis present

## 2013-01-24 DIAGNOSIS — I10 Essential (primary) hypertension: Secondary | ICD-10-CM

## 2013-01-24 DIAGNOSIS — Z794 Long term (current) use of insulin: Secondary | ICD-10-CM

## 2013-01-24 DIAGNOSIS — A419 Sepsis, unspecified organism: Principal | ICD-10-CM

## 2013-01-24 DIAGNOSIS — R000 Tachycardia, unspecified: Secondary | ICD-10-CM | POA: Diagnosis present

## 2013-01-24 DIAGNOSIS — J189 Pneumonia, unspecified organism: Secondary | ICD-10-CM

## 2013-01-24 DIAGNOSIS — Z720 Tobacco use: Secondary | ICD-10-CM

## 2013-01-24 DIAGNOSIS — E1139 Type 2 diabetes mellitus with other diabetic ophthalmic complication: Secondary | ICD-10-CM

## 2013-01-24 HISTORY — DX: Pneumonia, unspecified organism: J18.9

## 2013-01-24 LAB — GLUCOSE, CAPILLARY: Glucose-Capillary: 312 mg/dL — ABNORMAL HIGH (ref 70–99)

## 2013-01-24 LAB — LIPASE, BLOOD: Lipase: 21 U/L (ref 11–59)

## 2013-01-24 LAB — COMPREHENSIVE METABOLIC PANEL
Albumin: 2.3 g/dL — ABNORMAL LOW (ref 3.5–5.2)
Alkaline Phosphatase: 114 U/L (ref 39–117)
BUN: 20 mg/dL (ref 6–23)
Chloride: 93 mEq/L — ABNORMAL LOW (ref 96–112)
Creatinine, Ser: 1.32 mg/dL (ref 0.50–1.35)
GFR calc Af Amer: 73 mL/min — ABNORMAL LOW (ref >90)
GFR calc non Af Amer: 63 mL/min — ABNORMAL LOW (ref >90)
Glucose, Bld: 415 mg/dL — ABNORMAL HIGH (ref 70–99)
Total Bilirubin: 0.5 mg/dL (ref 0.3–1.2)

## 2013-01-24 LAB — CBC
HCT: 42.8 % (ref 39.0–52.0)
Hemoglobin: 14.7 g/dL (ref 13.0–17.0)
MCH: 30.7 pg (ref 26.0–34.0)
MCHC: 34.3 g/dL (ref 30.0–36.0)
MCV: 89.4 fL (ref 78.0–100.0)
Platelets: 123 K/uL — ABNORMAL LOW (ref 150–400)
RBC: 4.79 MIL/uL (ref 4.22–5.81)
RDW: 13.2 % (ref 11.5–15.5)
WBC: 13.6 10*3/uL — ABNORMAL HIGH (ref 4.0–10.5)

## 2013-01-24 LAB — POCT I-STAT TROPONIN I: Troponin i, poc: 0.02 ng/mL (ref 0.00–0.08)

## 2013-01-24 LAB — BASIC METABOLIC PANEL
CO2: 26 mEq/L (ref 19–32)
Calcium: 8.4 mg/dL (ref 8.4–10.5)
Creatinine, Ser: 1.45 mg/dL — ABNORMAL HIGH (ref 0.50–1.35)
GFR calc non Af Amer: 56 mL/min — ABNORMAL LOW (ref >90)
Glucose, Bld: 465 mg/dL — ABNORMAL HIGH (ref 70–99)
Sodium: 131 mEq/L — ABNORMAL LOW (ref 135–145)

## 2013-01-24 LAB — CK TOTAL AND CKMB (NOT AT ARMC)
CK, MB: 1.5 ng/mL (ref 0.3–4.0)
Total CK: 193 U/L (ref 7–232)

## 2013-01-24 LAB — DIFFERENTIAL
Basophils Absolute: 0 10*3/uL (ref 0.0–0.1)
Basophils Relative: 0 % (ref 0–1)
Eosinophils Absolute: 0 10*3/uL (ref 0.0–0.7)
Eosinophils Relative: 0 % (ref 0–5)
Lymphs Abs: 1 10*3/uL (ref 0.7–4.0)
Neutrophils Relative %: 86 % — ABNORMAL HIGH (ref 43–77)

## 2013-01-24 LAB — TROPONIN I: Troponin I: <0.3 ng/mL (ref <0.30)

## 2013-01-24 LAB — CG4 I-STAT (LACTIC ACID): Lactic Acid, Venous: 1.39 mmol/L (ref 0.5–2.2)

## 2013-01-24 LAB — STREP PNEUMONIAE URINARY ANTIGEN: Strep Pneumo Urinary Antigen: NEGATIVE

## 2013-01-24 MED ORDER — INSULIN ASPART 100 UNIT/ML ~~LOC~~ SOLN: 0.0000 [IU] | Freq: Three times a day (TID) | SUBCUTANEOUS | Status: DC

## 2013-01-24 MED ORDER — SODIUM CHLORIDE 0.9 % IV BOLUS (SEPSIS): 1000.0000 mL | Freq: Once | INTRAVENOUS | Status: DC

## 2013-01-24 MED ORDER — TERAZOSIN HCL 1 MG PO CAPS
1.0000 mg | ORAL_CAPSULE | Freq: Every day | ORAL | Status: DC
  Administered 2013-01-24 – 2013-01-29 (×6): 1 mg via ORAL
  Filled 2013-01-24 (×7): qty 1

## 2013-01-24 MED ORDER — INSULIN ASPART 100 UNIT/ML ~~LOC~~ SOLN
10.0000 [IU] | Freq: Once | SUBCUTANEOUS | Status: AC
  Administered 2013-01-24: 10 [IU] via SUBCUTANEOUS

## 2013-01-24 MED ORDER — SODIUM CHLORIDE 0.9 % IV BOLUS (SEPSIS)
500.0000 mL | Freq: Once | INTRAVENOUS | Status: AC
  Administered 2013-01-24: 500 mL via INTRAVENOUS

## 2013-01-24 MED ORDER — METOPROLOL TARTRATE 100 MG PO TABS
100.0000 mg | ORAL_TABLET | Freq: Two times a day (BID) | ORAL | Status: DC
  Administered 2013-01-24 – 2013-01-28 (×8): 100 mg via ORAL
  Filled 2013-01-24 (×10): qty 1

## 2013-01-24 MED ORDER — IPRATROPIUM BROMIDE 0.02 % IN SOLN: 0.5000 mg | RESPIRATORY_TRACT | Status: DC

## 2013-01-24 MED ORDER — AMITRIPTYLINE HCL 50 MG PO TABS
50.0000 mg | ORAL_TABLET | Freq: Every day | ORAL | Status: DC
  Administered 2013-01-24 – 2013-01-29 (×6): 50 mg via ORAL
  Filled 2013-01-24 (×7): qty 1

## 2013-01-24 MED ORDER — NITROGLYCERIN 0.4 MG SL SUBL
SUBLINGUAL_TABLET | SUBLINGUAL | Status: AC
  Administered 2013-01-24: 22:00:00
  Filled 2013-01-24: qty 25

## 2013-01-24 MED ORDER — ASPIRIN 81 MG PO CHEW
81.0000 mg | CHEWABLE_TABLET | Freq: Every day | ORAL | Status: DC
  Administered 2013-01-25 – 2013-01-30 (×6): 81 mg via ORAL
  Filled 2013-01-24 (×6): qty 1

## 2013-01-24 MED ORDER — ALBUTEROL SULFATE (5 MG/ML) 0.5% IN NEBU: 2.5000 mg | INHALATION_SOLUTION | RESPIRATORY_TRACT | Status: DC

## 2013-01-24 MED ORDER — INSULIN GLARGINE 100 UNIT/ML ~~LOC~~ SOLN: 30.0000 [IU] | Freq: Every day | SUBCUTANEOUS | Status: DC

## 2013-01-24 MED ORDER — ASPIRIN 325 MG PO TABS
325.0000 mg | ORAL_TABLET | ORAL | Status: AC
Start: 2013-01-24 — End: 2013-01-24
  Administered 2013-01-24: 325 mg via ORAL
  Filled 2013-01-24: qty 1

## 2013-01-24 MED ORDER — ATORVASTATIN CALCIUM 80 MG PO TABS
80.0000 mg | ORAL_TABLET | Freq: Every day | ORAL | Status: DC
  Administered 2013-01-25 – 2013-01-29 (×5): 80 mg via ORAL
  Filled 2013-01-24 (×6): qty 1

## 2013-01-24 MED ORDER — LEVALBUTEROL HCL 0.63 MG/3ML IN NEBU
0.6300 mg | INHALATION_SOLUTION | Freq: Four times a day (QID) | RESPIRATORY_TRACT | Status: DC | PRN
  Filled 2013-01-24: qty 3

## 2013-01-24 MED ORDER — ENOXAPARIN SODIUM 40 MG/0.4ML ~~LOC~~ SOLN
40.0000 mg | SUBCUTANEOUS | Status: DC
  Administered 2013-01-24 – 2013-01-26 (×3): 40 mg via SUBCUTANEOUS
  Filled 2013-01-24 (×4): qty 0.4

## 2013-01-24 MED ORDER — PREGABALIN 50 MG PO CAPS
100.0000 mg | ORAL_CAPSULE | Freq: Two times a day (BID) | ORAL | Status: DC
  Administered 2013-01-24 – 2013-01-30 (×12): 100 mg via ORAL
  Filled 2013-01-24 (×2): qty 1
  Filled 2013-01-24: qty 2
  Filled 2013-01-24 (×4): qty 1
  Filled 2013-01-24 (×2): qty 2
  Filled 2013-01-24 (×3): qty 1

## 2013-01-24 MED ORDER — LEVOFLOXACIN IN D5W 750 MG/150ML IV SOLN
750.0000 mg | INTRAVENOUS | Status: DC
  Filled 2013-01-24: qty 150

## 2013-01-24 MED ORDER — ACETAMINOPHEN 325 MG PO TABS
650.0000 mg | ORAL_TABLET | Freq: Four times a day (QID) | ORAL | Status: DC | PRN
  Administered 2013-01-24 – 2013-01-26 (×5): 650 mg via ORAL
  Filled 2013-01-24 (×5): qty 2

## 2013-01-24 MED ORDER — TRAMADOL HCL 50 MG PO TABS
50.0000 mg | ORAL_TABLET | Freq: Four times a day (QID) | ORAL | Status: DC | PRN
  Filled 2013-01-24: qty 1

## 2013-01-24 MED ORDER — INSULIN ASPART 100 UNIT/ML ~~LOC~~ SOLN
0.0000 [IU] | Freq: Every day | SUBCUTANEOUS | Status: DC
  Administered 2013-01-24: 5 [IU] via SUBCUTANEOUS
  Administered 2013-01-25: 4 [IU] via SUBCUTANEOUS
  Administered 2013-01-26: 5 [IU] via SUBCUTANEOUS
  Administered 2013-01-28: 3 [IU] via SUBCUTANEOUS

## 2013-01-24 MED ORDER — NICOTINE 14 MG/24HR TD PT24
14.0000 mg | MEDICATED_PATCH | Freq: Every day | TRANSDERMAL | Status: DC
  Administered 2013-01-24 – 2013-01-30 (×8): 14 mg via TRANSDERMAL
  Filled 2013-01-24 (×7): qty 1

## 2013-01-24 MED ORDER — ALBUTEROL SULFATE (5 MG/ML) 0.5% IN NEBU
5.0000 mg | INHALATION_SOLUTION | Freq: Once | RESPIRATORY_TRACT | Status: AC
  Administered 2013-01-24: 5 mg via RESPIRATORY_TRACT
  Filled 2013-01-24: qty 1

## 2013-01-24 MED ORDER — IPRATROPIUM BROMIDE 0.02 % IN SOLN
0.5000 mg | Freq: Once | RESPIRATORY_TRACT | Status: AC
  Administered 2013-01-24: 0.5 mg via RESPIRATORY_TRACT
  Filled 2013-01-24: qty 2.5

## 2013-01-24 MED ORDER — INSULIN GLARGINE 100 UNIT/ML ~~LOC~~ SOLN
20.0000 [IU] | Freq: Every day | SUBCUTANEOUS | Status: DC
  Administered 2013-01-24: 20 [IU] via SUBCUTANEOUS
  Filled 2013-01-24: qty 0.2

## 2013-01-24 MED ORDER — SODIUM CHLORIDE 0.9 % IV SOLN
INTRAVENOUS | Status: DC
  Administered 2013-01-24: 19:00:00 via INTRAVENOUS

## 2013-01-24 MED ORDER — LEVOFLOXACIN IN D5W 750 MG/150ML IV SOLN
750.0000 mg | Freq: Once | INTRAVENOUS | Status: AC
  Administered 2013-01-24: 750 mg via INTRAVENOUS
  Filled 2013-01-24: qty 150

## 2013-01-24 MED ORDER — LISINOPRIL 40 MG PO TABS
40.0000 mg | ORAL_TABLET | Freq: Every day | ORAL | Status: DC
  Administered 2013-01-24 – 2013-01-30 (×7): 40 mg via ORAL
  Filled 2013-01-24 (×8): qty 1

## 2013-01-24 MED ORDER — NITROGLYCERIN 0.4 MG SL SUBL: 0.4000 mg | SUBLINGUAL_TABLET | SUBLINGUAL | Status: DC | PRN

## 2013-01-24 MED ORDER — INSULIN GLARGINE 100 UNIT/ML ~~LOC~~ SOLN
10.0000 [IU] | Freq: Once | SUBCUTANEOUS | Status: AC
  Administered 2013-01-24: 10 [IU] via SUBCUTANEOUS
  Filled 2013-01-24: qty 0.1

## 2013-01-24 MED ORDER — INSULIN ASPART 100 UNIT/ML ~~LOC~~ SOLN
10.0000 [IU] | Freq: Once | SUBCUTANEOUS | Status: AC
  Administered 2013-01-24: 10 [IU] via SUBCUTANEOUS
  Filled 2013-01-24: qty 1

## 2013-01-24 MED ORDER — IBUPROFEN 800 MG PO TABS
800.0000 mg | ORAL_TABLET | Freq: Once | ORAL | Status: AC
  Administered 2013-01-24: 800 mg via ORAL
  Filled 2013-01-24: qty 1

## 2013-01-24 MED ORDER — INSULIN ASPART 100 UNIT/ML ~~LOC~~ SOLN
4.0000 [IU] | Freq: Three times a day (TID) | SUBCUTANEOUS | Status: DC
  Administered 2013-01-25 (×3): 4 [IU] via SUBCUTANEOUS
  Administered 2013-01-26: 13:00:00 via SUBCUTANEOUS
  Administered 2013-01-26: 4 [IU] via SUBCUTANEOUS
  Administered 2013-01-26: 19:00:00 via SUBCUTANEOUS

## 2013-01-24 MED ORDER — INSULIN ASPART 100 UNIT/ML ~~LOC~~ SOLN
0.0000 [IU] | Freq: Three times a day (TID) | SUBCUTANEOUS | Status: DC
  Administered 2013-01-25 (×2): 7 [IU] via SUBCUTANEOUS
  Administered 2013-01-25: 15 [IU] via SUBCUTANEOUS
  Administered 2013-01-26: 20 [IU] via SUBCUTANEOUS
  Administered 2013-01-26: 11 [IU] via SUBCUTANEOUS
  Administered 2013-01-26: 15 [IU] via SUBCUTANEOUS
  Administered 2013-01-27 (×2): 7 [IU] via SUBCUTANEOUS
  Administered 2013-01-27: 4 [IU] via SUBCUTANEOUS
  Administered 2013-01-28: 7 [IU] via SUBCUTANEOUS
  Administered 2013-01-28 – 2013-01-29 (×3): 15 [IU] via SUBCUTANEOUS
  Administered 2013-01-30: 20 [IU] via SUBCUTANEOUS
  Administered 2013-01-30: 11 [IU] via SUBCUTANEOUS

## 2013-01-24 NOTE — Progress Notes (Signed)
Jerry Cantrell [id number] Code Status: full   Admission Data: 01/24/2013 6:50 PM Attending Provider:  Daryll Drown [id number], Brett Canales, MD Consults/ Treatment Team:    Jerry Cantrell is a 47 y.o. male patient admitted from ED awake, alert - oriented  X 3 - no acute distress noted.  VSS - Blood pressure 185/105, pulse 102, temperature 100.2 F (37.9 C), temperature source Oral, resp. rate 20, height 6\' 1"  (1.854 m), weight 136.986 kg (302 lb), SpO2 96.00%.  no c/o shortness of breath, no c/o chest pain.  O2:   @ 2 l/min per minute. IV Fluids:  IV in place, occlusive dsg intact without redness, IV cath hand left, condition patent and no redness normal saline.  Allergies:  No Known Allergies   Past Medical History  Diagnosis Date  . Diabetes mellitus   . Hypertension   . Hyperlipidemia   . Meralgia paraesthetica 05/03/2012  . Lumbar spondylosis 05/03/2012  . Lumbar spinal stenosis 05/03/2012    Mild with only right L4 nerve root encroachment, no neurogenic claudication   . 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 Urinary incontinence 05/12/2012    Since the start of Sept. 2013   . Hemorrhoids 05/12/2012  . UTI (lower urinary tract infection)   . Meralgia paraesthetica 05/03/2012    On Lyrica which does improve pain.   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 Substance abuse   . Hyperlipidemia   . Pneumonia    Medications Prior to Admission  Medication Sig Dispense Refill  . albuterol (PROVENTIL HFA;VENTOLIN HFA) 108 (90 BASE) MCG/ACT inhaler Inhale 2 puffs into the lungs every 4 (four) hours as needed for wheezing or shortness of breath.  1 Inhaler  3  . amitriptyline (ELAVIL) 50 MG tablet Take 50 mg by mouth at bedtime.      . furosemide (LASIX) 20 MG tablet Take 1 tablet (20 mg total) by mouth daily.  30 tablet  6  . insulin aspart (NOVOLOG) 100 UNIT/ML injection Inject 20 Units into the skin 5 (five) times daily. With meals and snacks      . insulin glargine (LANTUS) 100 UNIT/ML injection  Inject 0.4 mLs (40 Units total) into the skin 2 (two) times daily.  15 mL  11  . lisinopril (PRINIVIL,ZESTRIL) 40 MG tablet Take 1 tablet (40 mg total) by mouth daily.  90 tablet  6  . metoprolol (LOPRESSOR) 100 MG tablet Take 1 tablet (100 mg total) by mouth 2 (two) times daily.  90 tablet  6  . omega-3 acid ethyl esters (LOVAZA) 1 G capsule Take 2 capsules (2 g total) by mouth 2 (two) times daily.  60 capsule  11  . pregabalin (LYRICA) 100 MG capsule Take 1 capsule (100 mg total) by mouth 2 (two) times daily.  90 capsule  6  . terazosin (HYTRIN) 1 MG capsule Take 1 mg by mouth at bedtime.       History:  obtained from the patient. Tobacco/alcohol: denied none  Orientation to room, and floor completed with information packet given to patient/family.  Patient declined safety video at this time.  Admission INP armband ID verified with patient/family, and in place.   SR up x 2, fall assessment complete, with patient and family able to verbalize understanding of risk associated with falls, and verbalized understanding to call nsg before up out of bed.  Call light within reach, patient able to voice, and demonstrate understanding.  Skin, clean-dry- intact without evidence of bruising, or skin tears.  No evidence of skin break down noted on exam.     Will cont to eval and treat per MD orders.  Gracy Bruins, RN 01/24/2013 6:50 PM

## 2013-01-24 NOTE — ED Provider Notes (Signed)
Medical screening examination/treatment/procedure(s) were performed by non-physician practitioner and as supervising physician I was immediately available for consultation/collaboration.   Saddie Benders. Maizie Garno, MD 01/24/13 1655

## 2013-01-24 NOTE — ED Provider Notes (Signed)
History     CSN: [id number]  Arrival date & time 01/24/13  1233   First MD Initiated Contact with Patient 01/24/13 1309      Chief Complaint  Patient presents with  . Chest Pain  . Shortness of Breath    (Consider location/radiation/quality/duration/timing/severity/associated sxs/prior treatment) HPI Barret Palmisano is a 47 y.o. male who presents to ED with complaint of chest pain, cough, shortness of breath. States has had cough for several weeks, was seen in ER a weeka go, diagnosed with pneumonia. States finished zpack. States felt well about 4 days ago, since then gradually worsening. Pt states he is having increased cough, post tussive emesis, generalized malaise, body aches, headache. States took his regular medications today, did not take anything for pain or cough. Denies sore throat or congestion. No abdominal pain. No urinary symptoms.    Past Medical History  Diagnosis Date  . Diabetes mellitus   . Hypertension   . Hyperlipidemia   . Meralgia paraesthetica 05/03/2012  . Lumbar spondylosis 05/03/2012  . Lumbar spinal stenosis 05/03/2012    Mild with only right L4 nerve root encroachment, no neurogenic claudication   . 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 Urinary incontinence 05/12/2012    Since the start of Sept. 2013   . Hemorrhoids 05/12/2012  . UTI (lower urinary tract infection)   . Meralgia paraesthetica 05/03/2012    On Lyrica which does improve pain.   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 Substance abuse   . Hyperlipidemia     Past Surgical History  Procedure Laterality Date  . Tonsillectomy    . Abdominal surgery      Abscess I&D 2/2 infected hair  . Colonoscopy w/ polypectomy      pt to bring records    Family History  Problem Relation Age of Onset  . Cancer Father     Colon cancer- in remission  . Alcohol abuse Father     History  Substance Use Topics  . Smoking status: Current Every Day Smoker -- 0.70 packs/day    Types: Cigarettes  .  Smokeless tobacco: Never Used  . Alcohol Use: 7.2 oz/week    12 Cans of beer per week     Comment: occasionally      Review of Systems  Constitutional: Positive for fever, chills and fatigue.  HENT: Negative for ear pain, congestion, sore throat, neck pain, neck stiffness and sinus pressure.   Respiratory: Positive for cough, chest tightness, shortness of breath and wheezing.   Cardiovascular: Positive for chest pain. Negative for palpitations and leg swelling.  Gastrointestinal: Negative.   Genitourinary: Negative for dysuria and flank pain.  Musculoskeletal: Negative for back pain.  Skin: Negative.   Neurological: Positive for headaches. Negative for dizziness, weakness and light-headedness.  All other systems reviewed and are negative.    Allergies  Review of patient's allergies indicates no known allergies.  Home Medications   Current Outpatient Rx  Name  Route  Sig  Dispense  Refill  . albuterol (PROVENTIL HFA;VENTOLIN HFA) 108 (90 BASE) MCG/ACT inhaler   Inhalation   Inhale 2 puffs into the lungs every 4 (four) hours as needed for wheezing or shortness of breath.   1 Inhaler   3   . amitriptyline (ELAVIL) 50 MG tablet   Oral   Take 50 mg by mouth at bedtime.         . furosemide (LASIX) 20 MG tablet   Oral   Take 1 tablet (20 mg  total) by mouth daily.   30 tablet   6   . insulin aspart (NOVOLOG) 100 UNIT/ML injection   Subcutaneous   Inject 20 Units into the skin 5 (five) times daily. With meals and snacks         . insulin glargine (LANTUS) 100 UNIT/ML injection   Subcutaneous   Inject 0.4 mLs (40 Units total) into the skin 2 (two) times daily.   15 mL   11     Please fill in PEN form, not VIAL form.   Marland Kitchen lisinopril (PRINIVIL,ZESTRIL) 40 MG tablet   Oral   Take 1 tablet (40 mg total) by mouth daily.   90 tablet   6   . metoprolol (LOPRESSOR) 100 MG tablet   Oral   Take 1 tablet (100 mg total) by mouth 2 (two) times daily.   90 tablet   6    . omega-3 acid ethyl esters (LOVAZA) 1 G capsule   Oral   Take 2 capsules (2 g total) by mouth 2 (two) times daily.   60 capsule   11   . pregabalin (LYRICA) 100 MG capsule   Oral   Take 1 capsule (100 mg total) by mouth 2 (two) times daily.   90 capsule   6   . terazosin (HYTRIN) 1 MG capsule   Oral   Take 1 mg by mouth at bedtime.           BP 162/87  Pulse 117  Temp(Src) 102.6 F (39.2 C) (Oral)  Resp 40  Ht 6\' 1"  (1.854 m)  Wt 302 lb (136.986 kg)  BMI 39.85 kg/m2  SpO2 92%  Physical Exam  Nursing note and vitals reviewed. Constitutional: He is oriented to person, place, and time. He appears well-developed and well-nourished.  Ill appearing  HENT:  Head: Normocephalic.  Right Ear: External ear normal.  Left Ear: External ear normal.  Nose: Nose normal.  Mouth/Throat: Oropharynx is clear and moist.  Eyes: Conjunctivae are normal.  Neck: Normal range of motion. Neck supple.  Cardiovascular: Regular rhythm and normal heart sounds.  Tachycardia present.   Pulmonary/Chest: Tachypnea noted. He has wheezes. He has rales in the left lower field.  Abdominal: Soft. Bowel sounds are normal. He exhibits no distension. There is no tenderness. There is no rebound.  Musculoskeletal: He exhibits no edema.  Lymphadenopathy:    He has no cervical adenopathy.  Neurological: He is alert and oriented to person, place, and time.  Skin: Skin is warm and dry.  Psychiatric: He has a normal mood and affect. His behavior is normal.    ED Course  Procedures (including critical care time)  Pt with recent pneumonia, now worsening cough fever 102, left chest pain, shortness of breath. Will get labs, CXR. Does not appear to have AMS or sings of end organ failure, doubt sepsis at this time. Tylenol ordered for fever. Neb ordered  Results for orders placed during the hospital encounter of 01/24/13  CBC      Result Value Range   WBC 13.6 (*) 4.0 - 10.5 K/uL   RBC 4.79  4.22 - 5.81  MIL/uL   Hemoglobin 14.7  13.0 - 17.0 g/dL   HCT 42.8  39.0 - 52.0 %   MCV 89.4  78.0 - 100.0 fL   MCH 30.7  26.0 - 34.0 pg   MCHC 34.3  30.0 - 36.0 g/dL   RDW 13.2  11.5 - 15.5 %   Platelets 123 (*) 150 -  400 K/uL  COMPREHENSIVE METABOLIC PANEL      Result Value Range   Sodium 130 (*) 135 - 145 mEq/L   Potassium 4.0  3.5 - 5.1 mEq/L   Chloride 93 (*) 96 - 112 mEq/L   CO2 26  19 - 32 mEq/L   Glucose, Bld 415 (*) 70 - 99 mg/dL   BUN 20  6 - 23 mg/dL   Creatinine, Ser 1.32  0.50 - 1.35 mg/dL   Calcium 8.6  8.4 - 10.5 mg/dL   Total Protein 6.3  6.0 - 8.3 g/dL   Albumin 2.3 (*) 3.5 - 5.2 g/dL   AST 18  0 - 37 U/L   ALT 14  0 - 53 U/L   Alkaline Phosphatase 114  39 - 117 U/L   Total Bilirubin 0.5  0.3 - 1.2 mg/dL   GFR calc non Af Amer 63 (*) >90 mL/min   GFR calc Af Amer 73 (*) >90 mL/min  LIPASE, BLOOD      Result Value Range   Lipase 21  11 - 59 U/L  DIFFERENTIAL      Result Value Range   Neutrophils Relative % 86 (*) 43 - 77 %   Neutro Abs 11.3 (*) 1.7 - 7.7 K/uL   Lymphocytes Relative 8 (*) 12 - 46 %   Lymphs Abs 1.0  0.7 - 4.0 K/uL   Monocytes Relative 6  3 - 12 %   Monocytes Absolute 0.9  0.1 - 1.0 K/uL   Eosinophils Relative 0  0 - 5 %   Eosinophils Absolute 0.0  0.0 - 0.7 K/uL   Basophils Relative 0  0 - 1 %   Basophils Absolute 0.0  0.0 - 0.1 K/uL  CG4 I-STAT (LACTIC ACID)      Result Value Range   Lactic Acid, Venous 1.39  0.5 - 2.2 mmol/L  POCT I-STAT TROPONIN I      Result Value Range   Troponin i, poc 0.02  0.00 - 0.08 ng/mL   Comment 3             Dg Chest 2 View  01/24/2013   *RADIOLOGY REPORT*  Clinical Data: Chest pain  CHEST - 2 VIEW  Comparison: 01/12/2013  Findings: The cardiac shadow is stable.  There is now a somewhat rounded area of increased density identified in the left lower lobe which was not seen on the prior exam most consistent with a round pneumonia.  No sizable effusion is seen.  No other focal abnormality is noted.  IMPRESSION: Changes  consistent with round pneumonia.  Follow-up films following appropriate therapy are recommended.   Original Report Authenticated By: Inez Catalina, M.D.     CXR showed pneumonia. Levaquin IV 750mg  ordered. Pt's glu  415, fluids and insulin 10u SQ ordered. WBC elevated. Lactic acid normal. Will call for admission. Continues to be hypoxic, low 90s sats on 2L [place], tachycardic in 110s.    Date: 01/24/2013  Rate: 118  Rhythm: sinus tachycardia  QRS Axis: normal  Intervals: normal  ST/T Wave abnormalities: nonspecific T wave changes  Conduction Disutrbances:none  Narrative Interpretation:   Old EKG Reviewed: none available   1. Community acquired pneumonia   2. Chest pain   3. Hyperglycemia       MDM  Pt with recent diagnosis of pneumonia, now worsening symptoms, fever 102.6, tachypnea, tachycardia. CXR today confirms round pneumonia in left lower lung, which could explain pt's pain in this area. Fluids stated. Nebs started.  Given levaquin for pt's infection IV. Tylenol and motrin for fever. 10u of SQ insulin for hyperglycemia. Pt is AAOx3, no sings of end organ damage. Negative lactic acid. Spoke with internal medicine, will admit pt for further treatment.   Continues to by hypoxic, tachycardic, tachypnea. Questioned PE, however at this time most likely infections given elevated WBC, fever, cough.     Filed Vitals:   01/24/13 1330 01/24/13 1400 01/24/13 1430 01/24/13 1530  BP: 162/87  159/87 145/71  Pulse: 117  112 110  Temp:  102.4 F (39.1 C)  101 F (38.3 C)  TempSrc:  Oral  Oral  Resp: 40  29 29  Height:      Weight:      SpO2: 92%  95% 96%       Renold Genta, PA-C 01/24/13 1638

## 2013-01-24 NOTE — ED Notes (Signed)
Report received, assumed care.  

## 2013-01-24 NOTE — ED Notes (Signed)
CP starting yesterday. Worsening substernal today. Non radiating. SOB starting Saturday, worsening this am.

## 2013-01-24 NOTE — Progress Notes (Addendum)
INTERNAL MEDICINE TEACHING SERVICE Night Float Progress Note   Subjective:    We were called overnight by the RN for evaluation of chest pain and increased work of breathing. His O2 sats dropped to 80 while he was on [place] oxygen and he was changed to a venti-mask and his O2 sats improved to 96%. At time of evaluation, pt was complaining of left sided chest pain, it was sharp, non-radiating and had somehow improved with nitroglycerine. He became more comfortable when he was sat up in bed. Earlier his CBG was 418.   The patient actually takes Lantus 40 units bid instead of 40 units qhs as listed in this medications reconciliation on admission.    Objective:    BP 192/96  Pulse 109  Temp(Src) 102.9 F (39.4 C) (Oral)  Resp 24  Ht 6\' 1"  (1.854 m)  Wt 296 lb 4.8 oz (134.401 kg)  BMI 39.1 kg/m2  SpO2 94%   Labs: Basic Metabolic Panel:    Component Value Date/Time   NA 130* 01/24/2013 1320   K 4.0 01/24/2013 1320   CL 93* 01/24/2013 1320   CO2 26 01/24/2013 1320   BUN 20 01/24/2013 1320   CREATININE 1.32 01/24/2013 1320   CREATININE 1.19 01/17/2013 1005   GLUCOSE 415* 01/24/2013 1320   CALCIUM 8.6 01/24/2013 1320    CBC:    Component Value Date/Time   WBC 13.6* 01/24/2013 1321   HGB 14.7 01/24/2013 1321   HCT 42.8 01/24/2013 1321   PLT 123* 01/24/2013 1321   MCV 89.4 01/24/2013 1321   NEUTROABS 11.3* 01/24/2013 1321   LYMPHSABS 1.0 01/24/2013 1321   MONOABS 0.9 01/24/2013 1321   EOSABS 0.0 01/24/2013 1321   BASOSABS 0.0 01/24/2013 1321    Cardiac Enzymes: Lab Results  Component Value Date   TROPONINI <0.30 01/24/2013    Physical Exam: General: Vital signs reviewed and noted. obese, in moderate acute distress; alert, appropriate and cooperative throughout examination. Son at bedside, patient able to answer questions in short sentences  Lungs:  Normal respiratory effort. Clear to auscultation BL without crackles or wheezes. Tender to palpation bilaterally.   Heart: RRR. S1 and S2 normal  without gallop, murmur, or rubs.  Abdomen:  BS normoactive. Soft, Nondistended, non-tender.  No masses or organomegaly.  Extremities: No pretibial edema.     Assessment/ Plan:    Chest pain with Dyspnea: Likely cause of his SOB is pneumonia in the setting on undiagnosed COPD given his long history of tobacco abuse. He has risk factors for ACS - DM, Age, Sex, HLD, and HTN. Stat EKG no acute changes but reveal tachycardia. Other causes of chest pain like PE and Aortic dissection are unlikely Plan  - cycle troponin - Tramadol for pain control  - NTG 0.4mg  prn for chest pain. ASA already given.  - will try to limit too much oxygen if we can maintain Sat >92 with [place] oxygen. Will switch to [place] since he appears to be more comfortable  - change Albuterol to Xopenex Q6hrs due to tachycardia  - will continue to monitor closely for dyspnea and vital signs   - will dc is IV fluids as his BP is elevated.   DM: CBGs are high after dinner. He is on SSI resistant.  Plan  - Add meal coverage - 4 units  - Increase lantus to 30 units bid (home dose is 40 bid)  Jessee Avers, MD PGY-1 Internal Medicine Teaching Service Pager: [phone]  01/24/2013, 11:23 PM

## 2013-01-24 NOTE — H&P (Signed)
Hospital Admission Note Date: 01/24/2013  Patient name: Jerry Cantrell Medical record number: [id number] Date of birth: [birth year] Age: 47 y.o. Gender: male PCP: Jerry Bellows, MD  Medical Service: Jerry Cantrell  Attending physician: Jerry Cantrell    1st Contact: Dr. Eulas Cantrell   Pager: [phone] 2nd Contact: Dr. Burnard Cantrell   Pager: [phone] After 5 pm or weekends: 1st Contact:      Pager: [phone] 2nd Contact:      Pager: [phone]  Chief Complaint: CP/SOB  History of Present Illness: [name] is a 47 yo M with history of DM & HTN who presented to the ED on 01/24/13 with complaints of chest pain with cough, sputum production with hempotysis & Cantrell tussive emesis, shortness of breath (worse with exertion), generalized malaise, fatigue & fever/chills  for two weeks.  Of note, he was seen in the ED on 01/12/13 for the same, diagnosed with PNA and treated with azithromax, which he completed, and reportedly felt somewhat improved, but then started to deteriorate 4 days prior to admission.  He was seen for follow up in clinic on 01/18/13 and an echo was ordered to further evaluate his dyspnea on exertion, and lasix was started, though he has not started this medication yet.  He denies sore throat & congestion, and has not had any sick contacts.  He has had 3 episodes of PNA in the past and he continues to smoke.    Meds: Current Facility-Administered Medications  Medication Dose Route Frequency Provider Last Rate Last Dose  . acetaminophen (TYLENOL) tablet 650 mg  650 mg Oral Q6H PRN Jerry Cantrell. Ghim, MD   650 mg at 01/24/13 1331   Current Outpatient Prescriptions  Medication Sig Dispense Refill  . albuterol (PROVENTIL HFA;VENTOLIN HFA) 108 (90 BASE) MCG/ACT inhaler Inhale 2 puffs into the lungs every 4 (four) hours as needed for wheezing or shortness of breath.  1 Inhaler  3  . amitriptyline (ELAVIL) 50 MG tablet Take 50 mg by mouth at bedtime.      . furosemide (LASIX) 20 MG tablet Take 1 tablet  (20 mg total) by mouth daily.  30 tablet  6  . insulin aspart (NOVOLOG) 100 UNIT/ML injection Inject 20 Units into the skin 5 (five) times daily. With meals and snacks      . insulin glargine (LANTUS) 100 UNIT/ML injection Inject 0.4 mLs (40 Units total) into the skin 2 (two) times daily.  15 mL  11  . lisinopril (PRINIVIL,ZESTRIL) 40 MG tablet Take 1 tablet (40 mg total) by mouth daily.  90 tablet  6  . metoprolol (LOPRESSOR) 100 MG tablet Take 1 tablet (100 mg total) by mouth 2 (two) times daily.  90 tablet  6  . omega-3 acid ethyl esters (LOVAZA) 1 G capsule Take 2 capsules (2 g total) by mouth 2 (two) times daily.  60 capsule  11  . pregabalin (LYRICA) 100 MG capsule Take 1 capsule (100 mg total) by mouth 2 (two) times daily.  90 capsule  6  . terazosin (HYTRIN) 1 MG capsule Take 1 mg by mouth at bedtime.        Allergies: Allergies as of 01/24/2013  . (No Known Allergies)   Past Medical History  Diagnosis Date  . Diabetes mellitus   . Hypertension   . Hyperlipidemia   . Meralgia paraesthetica 05/03/2012  . Lumbar spondylosis 05/03/2012  . Lumbar spinal stenosis 05/03/2012    Mild with only right L4 nerve root encroachment, no neurogenic claudication   .  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 Urinary incontinence 05/12/2012    Since the start of Sept. 2013   . Hemorrhoids 05/12/2012  . UTI (lower urinary tract infection)   . Meralgia paraesthetica 05/03/2012    On Lyrica which does improve pain.   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 Substance abuse   . Hyperlipidemia    Past Surgical History  Procedure Laterality Date  . Tonsillectomy    . Abdominal surgery      Abscess I&D 2/2 infected hair  . Colonoscopy w/ polypectomy      pt to bring records   Family History  Problem Relation Age of Onset  . Cancer Father     Colon cancer- in remission  . Alcohol abuse Father    History   Social History  . Marital Status: Single    Spouse Name: N/A    Number of Children:  N/A  . Years of Education: N/A   Occupational History  . dietary services [employer]   Social History Main Topics  . Smoking status: Current Every Day Smoker -- 0.70 packs/day    Types: Cigarettes  . Smokeless tobacco: Never Used  . Alcohol Use: 7.2 oz/week    12 Cans of beer per week     Comment: occasionally  . Drug Use: Not on file  . Sexually Active: Not on file     Comment: Oral sex only   Other Topics Concern  . Not on file   Social History Narrative  . No narrative on file    Review of Systems: General: no changes in weight, +decrease in appetite Skin: no rash HEENT: no blurry vision, hearing changes, sore throat Pulm: per HPI CV: no palpitations Abd: no abdominal pain, diarrhea/constipation GU: no dysuria, hematuria, polyuria Neuro: no weakness, numbness, or tingling   Physical Exam: Blood pressure 145/71, pulse 110, temperature 101 F (38.3 C), temperature source Oral, resp. rate 29, height 6\' 1"  (1.854 m), weight 302 lb (136.986 kg), SpO2 96.00%. General: Alert, ill appearing, morbidly obese male, and cooperative on examination.  Head: Normocephalic and atraumatic.  Eyes: Pupils equal, round, and reactive to light, no injection and anicteric.  Mouth: Pharynx pink and moist, no erythema or exudates.  Neck: Supple, full ROM Lungs: Increased work of breathing, diffuse crackles, no wheezing aprreciated Heart: Tachycardic, regular rhythm, no murmur, no gallop, and no rub.  Abdomen: Soft, non-tender, distended, normal bowel sounds, no guarding, no rebound tenderness, no organomegaly.  Msk: No joint swelling, warmth, or erythema.  Extremities: 2+ radial and DP pulses bilaterally. No cyanosis, clubbing, trace pitting edema Neurologic: Alert & oriented X3, cranial nerves II-XII intact, strength normal in all extremities Skin: Turgor normal and no rashes.  Psych: Normal mood and affect. Memory intact for recent and remote, normally interactive, good eye contact, not  anxious appearing, and not depressed appearing.   Lab results: Basic Metabolic Panel:  Recent Labs  01/24/13 1320  NA 130*  K 4.0  CL 93*  CO2 26  GLUCOSE 415*  BUN 20  CREATININE 1.32  CALCIUM 8.6  Anion Gap = 11  Liver Function Tests:  Recent Labs  01/24/13 1320  AST 18  ALT 14  ALKPHOS 114  BILITOT 0.5  PROT 6.3  ALBUMIN 2.3*    Recent Labs  01/24/13 1320  LIPASE 21   CBC:  Recent Labs  01/24/13 1321  WBC 13.6*  NEUTROABS 11.3*  HGB 14.7  HCT 42.8  MCV 89.4  PLT 123*  Results for REYNIEL, GILCREST (MRN [id number]) as of 01/24/2013 15:44  01/24/2013 13:21  Neutrophils Relative % 86 (H)  Lymphocytes Relative 8 (L)  Monocytes Relative 6  Eosinophils Relative 0  Basophils Relative 0  NEUT# 11.3 (H)  Lymphocytes Absolute 1.0  Monocytes Absolute 0.9  Eosinophils Absolute 0.0  Basophils Absolute 0.0    Cardiac Enzymes: iStat Troponin 0.02  Lactic Acid: 1.39  Imaging results:  Dg Chest 2 View 01/24/2013 Clinical Data: Chest pain  CHEST - 2 VIEW  Comparison: 01/12/2013  Findings: The cardiac shadow is stable.  There is now a somewhat rounded area of increased density identified in the left lower lobe which was not seen on the prior exam most consistent with a round pneumonia.  No sizable effusion is seen.  No other focal abnormality is noted.  IMPRESSION: Changes consistent with round pneumonia.  Follow-up films following appropriate therapy are recommended.      Other results: EKG: Sinus tachycardia, normal axis, TWI in V6 consistent with old EKG, QTc 473  Assessment & Plan by Problem: 47 yo M with h/o HTN, DM, HLD & peripheral neuropathy presents on 01/24/13 with worsening SOB and chest pain.  #Sepsis:  Evidenced by leukocytosis, tachycardia, tachypnea and fever, with source of infection being CAP after outpatient treatment failure with Azithromycin.   -Admit to tele in setting of tachycardia -Continue Levaquin started in ED -No formal diagnosis  of COPD, but does have hx of smoking and responded to neb in ED, therefore continue Duonebs  -Follow blood cx (though drawn after abx) -Urine strep/legionella -Checking HIV -O2 therapy as needed for O2 Sat > 92% -Tylenol to manage fever -Gentle IVF hydration with NS @ 50cc/h -Patient will require follow up CXR in 6 weeks to ensure resolution, especially given history of tobacco abuse  #Chest pain:  Pt reports left sided chest pain present even when not coughing. Pain not reproducible on exam. No EKG changes. Will cycle troponins to r/o ACS. - Troponin x3   #DM:  Most recent A1c 7.4 on 01/17/13.  Home regimen includes Lantus 40u qHS & Novolog 8-10u TID with meals and HS -Lantus 20u qHS -SSI resistant   #HTN:  Normotensive at admission -Continue Lisinopril 40mg  daily -Continue Metoprolol 100mg  BID -Will hold lasix given likely insensible losses from resp distress  #Tobacco abuse:  Continues to smoke 3-10 cigarettes per day. -Counsel on cessation by MD and RN -Nicotine patch daily  #DVT PPx: Lovenox, SCDs   Dispo: Disposition is deferred at this time, awaiting improvement of current medical problems. Anticipated discharge in approximately 2-3 day(s).   The patient does have a current PCP Jerry Cantrell, Brett Canales, MD), therefore will be requiring OPC follow-up after discharge.   The patient does not have transportation limitations that hinder transportation to clinic appointments.  Signed: Otho Bellows, MD Internal Medicine Resident, PGY I [facility] Internal Medicine Program Pager: [phone] 01/24/2013 6:38 PM

## 2013-01-24 NOTE — ED Notes (Signed)
Pt sts when he was at [facility] pt was told he had fluid on heart. tatyana pa made aware sts x-rays negative for fluid on heart pt bolus changed from 1 liter to 564mL bolus. Pt made aware.

## 2013-01-25 DIAGNOSIS — IMO0001 Reserved for inherently not codable concepts without codable children: Secondary | ICD-10-CM

## 2013-01-25 DIAGNOSIS — E785 Hyperlipidemia, unspecified: Secondary | ICD-10-CM

## 2013-01-25 LAB — CBC
HCT: 40.1 % (ref 39.0–52.0)
Hemoglobin: 13.4 g/dL (ref 13.0–17.0)
MCH: 30.3 pg (ref 26.0–34.0)
MCHC: 33.4 g/dL (ref 30.0–36.0)
MCV: 90.7 fL (ref 78.0–100.0)
Platelets: 122 10*3/uL — ABNORMAL LOW (ref 150–400)
RBC: 4.42 MIL/uL (ref 4.22–5.81)
RDW: 13.4 % (ref 11.5–15.5)
WBC: 14.7 10*3/uL — ABNORMAL HIGH (ref 4.0–10.5)

## 2013-01-25 LAB — BASIC METABOLIC PANEL WITH GFR
GFR calc Af Amer: 73 mL/min — ABNORMAL LOW (ref >90)
GFR calc non Af Amer: 63 mL/min — ABNORMAL LOW (ref >90)
Potassium: 4 meq/L (ref 3.5–5.1)
Sodium: 134 meq/L — ABNORMAL LOW (ref 135–145)

## 2013-01-25 LAB — LEGIONELLA ANTIGEN, URINE: Legionella Antigen, Urine: NEGATIVE

## 2013-01-25 LAB — GLUCOSE, CAPILLARY
Glucose-Capillary: 320 mg/dL — ABNORMAL HIGH (ref 70–99)
Glucose-Capillary: 431 mg/dL — ABNORMAL HIGH (ref 70–99)

## 2013-01-25 LAB — HIV ANTIBODY (ROUTINE TESTING W REFLEX): HIV: NONREACTIVE

## 2013-01-25 LAB — BLOOD GAS, ARTERIAL
Bicarbonate: 25 mEq/L — ABNORMAL HIGH (ref 20.0–24.0)
Patient temperature: 98.6
pH, Arterial: 7.427 (ref 7.350–7.450)

## 2013-01-25 LAB — BASIC METABOLIC PANEL
BUN: 22 mg/dL (ref 6–23)
CO2: 24 mEq/L (ref 19–32)
Calcium: 8.2 mg/dL — ABNORMAL LOW (ref 8.4–10.5)
Chloride: 98 mEq/L (ref 96–112)
Creatinine, Ser: 1.32 mg/dL (ref 0.50–1.35)
Glucose, Bld: 367 mg/dL — ABNORMAL HIGH (ref 70–99)

## 2013-01-25 MED ORDER — INSULIN GLARGINE 100 UNIT/ML ~~LOC~~ SOLN: 30.0000 [IU] | Freq: Two times a day (BID) | SUBCUTANEOUS | Status: DC

## 2013-01-25 MED ORDER — VANCOMYCIN HCL 10 G IV SOLR
2250.0000 mg | Freq: Once | INTRAVENOUS | Status: AC
  Administered 2013-01-25: 2250 mg via INTRAVENOUS
  Filled 2013-01-25: qty 2250

## 2013-01-25 MED ORDER — INSULIN GLARGINE 100 UNIT/ML ~~LOC~~ SOLN
10.0000 [IU] | Freq: Once | SUBCUTANEOUS | Status: DC
  Filled 2013-01-25: qty 0.1

## 2013-01-25 MED ORDER — IPRATROPIUM BROMIDE 0.02 % IN SOLN: 0.5000 mg | Freq: Four times a day (QID) | RESPIRATORY_TRACT | Status: DC | PRN

## 2013-01-25 MED ORDER — INSULIN GLARGINE 100 UNIT/ML ~~LOC~~ SOLN
40.0000 [IU] | Freq: Two times a day (BID) | SUBCUTANEOUS | Status: DC
  Administered 2013-01-25 – 2013-01-26 (×2): 40 [IU] via SUBCUTANEOUS
  Filled 2013-01-25 (×3): qty 0.4

## 2013-01-25 MED ORDER — PIPERACILLIN-TAZOBACTAM 3.375 G IVPB
3.3750 g | Freq: Three times a day (TID) | INTRAVENOUS | Status: DC
  Administered 2013-01-25 – 2013-01-27 (×6): 3.375 g via INTRAVENOUS
  Filled 2013-01-25 (×8): qty 50

## 2013-01-25 MED ORDER — MORPHINE SULFATE 2 MG/ML IJ SOLN
1.0000 mg | INTRAMUSCULAR | Status: DC | PRN
  Administered 2013-01-25 (×3): 2 mg via INTRAVENOUS
  Administered 2013-01-25: 1 mg via INTRAVENOUS
  Administered 2013-01-26 (×3): 2 mg via INTRAVENOUS
  Filled 2013-01-25 (×7): qty 1

## 2013-01-25 MED ORDER — INSULIN GLARGINE 100 UNIT/ML ~~LOC~~ SOLN: 40.0000 [IU] | Freq: Two times a day (BID) | SUBCUTANEOUS | Status: DC

## 2013-01-25 MED ORDER — INSULIN GLARGINE 100 UNIT/ML ~~LOC~~ SOLN
40.0000 [IU] | Freq: Once | SUBCUTANEOUS | Status: AC
  Administered 2013-01-25: 40 [IU] via SUBCUTANEOUS
  Filled 2013-01-25: qty 0.4

## 2013-01-25 MED ORDER — VANCOMYCIN HCL 10 G IV SOLR
1250.0000 mg | Freq: Two times a day (BID) | INTRAVENOUS | Status: DC
  Administered 2013-01-25 – 2013-01-26 (×3): 1250 mg via INTRAVENOUS
  Filled 2013-01-25 (×4): qty 1250

## 2013-01-25 MED ORDER — FUROSEMIDE 20 MG PO TABS
20.0000 mg | ORAL_TABLET | Freq: Every day | ORAL | Status: DC
  Administered 2013-01-25 – 2013-01-27 (×3): 20 mg via ORAL
  Filled 2013-01-25 (×5): qty 1

## 2013-01-25 NOTE — Progress Notes (Signed)
Called on-call MD and notified them of blood tinged sputum and increased temperature. They stated they would come by soon.

## 2013-01-25 NOTE — Progress Notes (Signed)
Subjective: Chest pain overnight- EKG unchanged and troponin negative. Also with increased SOB- given Xopenex, Atrovent, and tramadol, which improved his sx. Still with significant SOB but CP improved.  Objective: Vital signs in last 24 hours: Filed Vitals:   01/24/13 2319 01/25/13 0500 01/25/13 0714 01/25/13 1013  BP: 163/89  146/91 151/90  Pulse: 94  100 82  Temp: 102.9 F (39.4 C)  101.9 F (38.8 C)   TempSrc: Oral  Oral   Resp: 38  28   Height:      Weight:  296 lb 5.1 oz (134.41 kg)    SpO2: 96%  93%    Weight change:   Intake/Output Summary (Last 24 hours) at 01/25/13 1020 Last data filed at 01/25/13 0900  Gross per 24 hour  Intake   1500 ml  Output   1200 ml  Net    300 ml   Vitals reviewed. General: Resting in bed, moderate respiratory distress HEENT: PERRL, EOMI, no scleral icterus Cardiac: Tachycardic, regular rhythm, no rubs, murmurs or gallops Pulm: Diminished breath sounds in LLL, no appreciable wheezes Abd: Obese, disteneded, nontender, BS present Ext: Warm and well perfused, trace pitting edema of BLE Neuro: alert and oriented X3, cranial nerves II-XII grossly intact, strength intact in BLU&LE  Lab Results: Basic Metabolic Panel:  Recent Labs Lab 01/24/13 2226 01/25/13 0625  NA 131* 134*  K 4.1 4.0  CL 94* 98  CO2 26 24  GLUCOSE 465* 367*  BUN 26* 22  CREATININE 1.45* 1.32  CALCIUM 8.4 8.2*   Liver Function Tests:  Recent Labs Lab 01/24/13 1320  AST 18  ALT 14  ALKPHOS 114  BILITOT 0.5  PROT 6.3  ALBUMIN 2.3*    Recent Labs Lab 01/24/13 1320  LIPASE 21   CBC:  Recent Labs Lab 01/24/13 1321 01/25/13 0625  WBC 13.6* 14.7*  NEUTROABS 11.3*  --   HGB 14.7 13.4  HCT 42.8 40.1  MCV 89.4 90.7  PLT 123* 122*   Cardiac Enzymes:  Recent Labs Lab 01/24/13 1934 01/24/13 2226 01/25/13 0630  CKTOTAL  --  193  --   CKMB  --  1.5  --   TROPONINI <0.30 <0.30 <0.30   CBG:  Recent Labs Lab 01/24/13 1843 01/24/13 2131  01/24/13 2358 01/25/13 0752  GLUCAP 312* 448* 431* 320*   Urine Drug Screen: Drugs of Abuse     Component Value Date/Time   LABOPIA NEG 06/02/2012 0945   LABOPIA NONE DETECTED 03/05/2010 0940   COCAINSCRNUR NEG 06/02/2012 0945   COCAINSCRNUR NONE DETECTED 03/05/2010 0940   LABBENZ NEG 06/02/2012 0945   LABBENZ NONE DETECTED 03/05/2010 0940   LABBENZ NEG 02/18/2010 2054   AMPHETMU NONE DETECTED 03/05/2010 0940   AMPHETMU NEG 02/18/2010 2054   THCU NONE DETECTED 03/05/2010 0940   LABBARB NEG 06/02/2012 0945   LABBARB  Value: NONE DETECTED        DRUG SCREEN FOR MEDICAL PURPOSES ONLY.  IF CONFIRMATION IS NEEDED FOR ANY PURPOSE, NOTIFY LAB WITHIN 5 DAYS.        LOWEST DETECTABLE LIMITS FOR URINE DRUG SCREEN Drug Class       Cutoff (ng/mL) Amphetamine      1000 Barbiturate      200 Benzodiazepine   [id number] Tricyclics       [id number] Opiates          300 Cocaine          300 THC  50 03/05/2010 0940    Urinalysis: No results found for this basename: COLORURINE, APPERANCEUR, LABSPEC, PHURINE, GLUCOSEU, HGBUR, BILIRUBINUR, KETONESUR, PROTEINUR, UROBILINOGEN, NITRITE, LEUKOCYTESUR,  in the last 168 hours   Micro Results: Recent Results (from the past 240 hour(s))  CULTURE, RESPIRATORY (NON-EXPECTORATED)     Status: None   Collection Time    01/24/13  5:40 PM      Result Value Range Status   Specimen Description SPUTUM   Final   Special Requests NONE   Final   Gram Stain     Final   Value: RARE WBC PRESENT,BOTH PMN AND MONONUCLEAR     RARE SQUAMOUS EPITHELIAL CELLS PRESENT     MODERATE GRAM POSITIVE COCCI     IN PAIRS   Culture PENDING   Incomplete   Report Status PENDING   Incomplete   Studies/Results: Dg Chest 2 View  01/24/2013   *RADIOLOGY REPORT*  Clinical Data: Chest pain  CHEST - 2 VIEW  Comparison: 01/12/2013  Findings: The cardiac shadow is stable.  There is now a somewhat rounded area of increased density identified in the left lower lobe which was not seen on the prior exam most  consistent with a round pneumonia.  No sizable effusion is seen.  No other focal abnormality is noted.  IMPRESSION: Changes consistent with round pneumonia.  Follow-up films following appropriate therapy are recommended.   Original Report Authenticated By: Inez Catalina, M.D.   Medications: I have reviewed the patient's current medications. Scheduled Meds: . amitriptyline  50 mg Oral QHS  . aspirin  81 mg Oral Daily  . atorvastatin  80 mg Oral q1800  . enoxaparin (LOVENOX) injection  40 mg Subcutaneous Q24H  . insulin aspart  0-20 Units Subcutaneous TID WC  . insulin aspart  0-5 Units Subcutaneous QHS  . insulin aspart  4 Units Subcutaneous TID WC  . [START ON 01/26/2013] insulin glargine  30 Units Subcutaneous BID  . lisinopril  40 mg Oral Daily  . metoprolol  100 mg Oral BID  . nicotine  14 mg Transdermal Daily  . piperacillin-tazobactam (ZOSYN)  IV  3.375 g Intravenous Q8H  . pregabalin  100 mg Oral BID  . terazosin  1 mg Oral QHS  . [START ON 01/26/2013] vancomycin  1,250 mg Intravenous Q12H  . vancomycin  2,250 mg Intravenous Once   Continuous Infusions:  PRN Meds:.acetaminophen, ipratropium, levalbuterol, nitroGLYCERIN, traMADol  Assessment/Plan: 47 yo M with h/o HTN, DM, HLD & peripheral neuropathy presents on 01/24/13 with worsening SOB and chest pain.   #Sepsis:  Evidenced by leukocytosis, tachycardia, tachypnea and fever, with source of infection being CAP after outpatient treatment failure with Azithromycin. Levaquin started in the ED and continued on admission without improvement of sx. Broad spectrum coverage initiated on 5/28. SOB overnight and neb treatments changed to Xopenox and Atrovent, which do seem to have helped somewhat.  and HIV negative. Legionella Ag and Strep Pneumo urinary Ag are both negative; however, he does have respiratory cx with GPCs in pairs. BCx NGTD. - Vancy/Zosyn - Follow blood cx (though drawn after abx)  - O2 therapy as needed for O2 Sat > 92%  -  Tylenol to manage fever  - Follow up CXR in 6 weeks to ensure resolution of PNA, especially given history of tobacco abuse   #Chest pain:  Pt reports left sided chest pain present even when not coughing. Pain not reproducible on exam. No EKG changes. Troponins negative x 3.   #DM:  Most  recent A1c 7.4 on 01/17/13. Home regimen includes Lantus 40u BID & Novolog 8-10u TID with meals and HS. CBGs elevated this admission.  -Lantus 40u BID -SSI resistant   #HTN:  Normotensive on admission, but BP elevated during hospitalization, likely from coughing.   -Continue Lisinopril 40mg  daily  -Continue Metoprolol 100mg  BID  -Restarting Lasix today  #Tobacco abuse:  Continues to smoke ~10 cigarettes per day.  -Counsel on cessation by MD and RN  -Nicotine patch daily   #DVT PPx:  Lovenox, SCDs     Dispo: Disposition is deferred at this time, awaiting improvement of current medical problems.  Anticipated discharge in approximately 2-3 day(s).   The patient does have a current PCP Eulas Post, Brett Canales, MD), therefore will be requiring OPC follow-up after discharge.   The patient does not have transportation limitations that hinder transportation to clinic appointments.  .Services Needed at time of discharge: Y = Yes, Blank = No PT:   OT:   RN:   Equipment:   Other:     LOS: 1 day   Otho Bellows 01/25/2013, 10:20 AM

## 2013-01-25 NOTE — H&P (Signed)
Internal Medicine Attending Admission Note Date: 01/25/2013  Patient name: Jerry Cantrell Medical record number: [id number] Date of birth: [birth year] Age: 47 y.o. Gender: male  I saw and evaluated the patient. I reviewed the resident's note and I agree with the resident's findings and plan as documented in the resident's note.  Chief Complaint(s): Chest pain and shortness of breath  History - key components related to admission: Patient is a 47 year old man with past medical history of diabetes complicated by neuropathy, hypertension, hyperlipidemia, h/o substance abuse and chronic kidney disease who comes in with chief complaints of chest pain which started 5 days prior to admission. Chest pain is described as sharp, present on the left lower chest, worse with cough, no relieving factors, associated with shortness of breath. Patient also complains sputum production with hemoptysis, shortness of breath, fevers, generalized malaise and fatigue for past 2 weeks. Patient was treated for community-acquired pneumonia on May 15. Patient initially felt better but got worse over the last 5 days. He has had 3 episodes of pneumonia in the past but continues to smoke.  Patient was in respiratory distress while talking to me and most of the history was obtained from chart and talking to his son. Patient continues to have fever overnight.    16 point review of system was negative except what is noted above.  Past medical history, past surgical history, medications, family history and social history was reviewed and is as per resident's note.  Physical Exam - key components related to admission:  Filed Vitals:   01/24/13 2319 01/25/13 0500 01/25/13 0714 01/25/13 1013  BP: 163/89  146/91 151/90  Pulse: 94  100 82  Temp: 102.9 F (39.4 C)  101.9 F (38.8 C)   TempSrc: Oral  Oral   Resp: 38  28   Height:      Weight:  296 lb 5.1 oz (134.41 kg)    SpO2: 96%  93%   Physical Exam: General: Vital signs  reviewed and noted. Well-developed, well-nourished, in mild to moderate respiratory distress; alert, appropriate and cooperative throughout examination.  Head: Normocephalic, atraumatic.  Eyes: PERRL, EOMI, No signs of anemia or jaundince.  Nose: Mucous membranes moist, not inflammed, nonerythematous.  Throat: Oropharynx nonerythematous, no exudate appreciated.   Neck: No deformities, masses, or tenderness noted.Supple, No carotid Bruits, no JVD.  Lungs:  increased respiratory effort. tachpnic Expiratory wheezing bilateral throughout lung fields, no crackles heard on exam  Heart: RRR. S1 and S2 normal without gallop, murmur, or rubs.  Abdomen:  BS normoactive. Soft, Nondistended, non-tender.  No masses or organomegaly.  Extremities: No pretibial edema.  Neurologic: A&O X3, CN II - XII are grossly intact. Motor strength is 5/5 in the all 4 extremities, Sensations intact to light touch, Cerebellar signs negative.  Skin: No visible rashes, scars.   Lab results:  Basic Metabolic Panel:  Recent Labs  01/24/13 2226 01/25/13 0625  NA 131* 134*  K 4.1 4.0  CL 94* 98  CO2 26 24  GLUCOSE 465* 367*  BUN 26* 22  CREATININE 1.45* 1.32  CALCIUM 8.4 8.2*   Liver Function Tests:  Recent Labs  01/24/13 1320  AST 18  ALT 14  ALKPHOS 114  BILITOT 0.5  PROT 6.3  ALBUMIN 2.3*    Recent Labs  01/24/13 1320  LIPASE 21   CBC:  Recent Labs  01/24/13 1321 01/25/13 0625  WBC 13.6* 14.7*  NEUTROABS 11.3*  --   HGB 14.7 13.4  HCT 42.8 40.1  MCV  89.4 90.7  PLT 123* 122*   Cardiac Enzymes:  Recent Labs  01/24/13 1934 01/24/13 2226 01/25/13 0630  CKTOTAL  --  193  --   CKMB  --  1.5  --   TROPONINI <0.30 <0.30 <0.30   CBG:  Recent Labs  01/24/13 1843 01/24/13 2131 01/24/13 2358 01/25/13 0752  GLUCAP 312* 448* 431* 320*   Imaging results:  Dg Chest 2 View  01/24/2013   *RADIOLOGY REPORT*  Clinical Data: Chest pain  CHEST - 2 VIEW  Comparison: 01/12/2013  Findings:  The cardiac shadow is stable.  There is now a somewhat rounded area of increased density identified in the left lower lobe which was not seen on the prior exam most consistent with a round pneumonia.  No sizable effusion is seen.  No other focal abnormality is noted.  IMPRESSION: Changes consistent with round pneumonia.  Follow-up films following appropriate therapy are recommended.   Original Report Authenticated By: Inez Catalina, M.D.    Other results: EKG: 118 beats per minute, normal axis, sinus rhythm, no ST or T wave changes except T-wave inversions which were also noted in previous EKG.  Assessment & Plan by Problem:  Principal Problem:   Sepsis Active Problems:   Hyperlipemia   Hypertension   Tobacco abuse   CAP (community acquired pneumonia)  Patient is a 47 year old man with past medical history most significant for diabetes, hypertension, hyperlipidemia and continued smoking who was seen treated for community-acquired pneumonia 2 weeks prior to admission comes back again with worsening shortness of breath and chest pain and was admitted with new left lower lobe pneumonia. The patient was admitted with signs and symptoms consistent with sepsis. This is a failure of outpatient treatment of community-acquired pneumonia. Patient was started on Levaquin by ER which has been continued inpatient. Appropriate cultures and antigens have been checked and the results are pending. Patient's condition with continued fevers, respiratory distress and wheezing on exam looks clinically concerning at this time. Plan for today based on above discussion will include -A reassessment of antibiotic regimen to include coverage for Psudomonas and resistant gram positive organisms -Increase the rate of IV fluids -Transfer the patient to step down -Low threshold for bipap -Continue bronchodilators nebulization with both albuterol and atrovent scheduled Q6H -Consider high dose steroids for bronchodilation as  patient does seem to have significant component of COPD from chronic smoking(no PFT's on file) -Follow up blood cultures and antigen results to modify antibiotic therapy accordingly -Chest x-ray in 6 weeks -Outpatient PFTs when better  Rest of the medical management as per resident's note.  Janell Quiet MD Faculty-Internal Medicine Residency Program

## 2013-01-25 NOTE — Progress Notes (Signed)
ANTIBIOTIC CONSULT NOTE - INITIAL  Pharmacy Consult for Vancomycin + Zosyn Indication: rule out pneumonia  No Known Allergies  Patient Measurements: Height: 6\' 1"  (185.4 cm) Weight: 296 lb 5.1 oz (134.41 kg) IBW/kg (Calculated) : 79.9  Vital Signs: Temp: 101.9 F (38.8 C) (05/28 0714) Temp src: Oral (05/28 0714) BP: 146/91 mmHg (05/28 0714) Pulse Rate: 100 (05/28 0714) Intake/Output from previous day: 05/27 0701 - 05/28 0700 In: 1500 [I.V.:1500] Out: 700 [Urine:700] Intake/Output from this shift: Total I/O In: -  Out: 500 [Urine:500]  Labs:  Recent Labs  01/24/13 1320 01/24/13 1321 01/24/13 2226 01/25/13 0625  WBC  --  13.6*  --  14.7*  HGB  --  14.7  --  13.4  PLT  --  123*  --  122*  CREATININE 1.32  --  1.45* 1.32   Estimated Creatinine Clearance: 99.5 ml/min (by C-G formula based on Cr of 1.32). No results found for this basename: VANCOTROUGH, Corlis Leak, VANCORANDOM, GENTTROUGH, GENTPEAK, GENTRANDOM, TOBRATROUGH, TOBRAPEAK, TOBRARND, AMIKACINPEAK, AMIKACINTROU, AMIKACIN,  in the last 72 hours   Microbiology: Recent Results (from the past 720 hour(s))  CULTURE, RESPIRATORY (NON-EXPECTORATED)     Status: None   Collection Time    01/24/13  5:40 PM      Result Value Range Status   Specimen Description SPUTUM   Final   Special Requests NONE   Final   Gram Stain     Final   Value: RARE WBC PRESENT,BOTH PMN AND MONONUCLEAR     RARE SQUAMOUS EPITHELIAL CELLS PRESENT     MODERATE GRAM POSITIVE COCCI     IN PAIRS   Culture PENDING   Incomplete   Report Status PENDING   Incomplete    Medical History: Past Medical History  Diagnosis Date  . Diabetes mellitus   . Hypertension   . Hyperlipidemia   . Meralgia paraesthetica 05/03/2012  . Lumbar spondylosis 05/03/2012  . Lumbar spinal stenosis 05/03/2012    Mild with only right L4 nerve root encroachment, no neurogenic claudication   . 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 Urinary incontinence 05/12/2012     Since the start of Sept. 2013   . Hemorrhoids 05/12/2012  . UTI (lower urinary tract infection)   . Meralgia paraesthetica 05/03/2012    On Lyrica which does improve pain.   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 Substance abuse   . Hyperlipidemia   . Pneumonia     Assessment: 47 y.o M who presented to the [facility] on 5/27 with SOB and CP after failing Zithromax for outpatient treatment of CAP and was started on Levaquin empirically for r/o PNA. The patient's respiratory condition worsened overnight along with fever spikes. Pharmacy was consulted to broaden antibiotics to Vancomycin + Zosyn. SCr 1.32, CrCl~70 ml/min (normalized)  Goal of Therapy:  Vancomycin trough level 15-20 mcg/ml Proper antibiotics for infection/cultures adjusted for renal/hepatic function   Plan:  1. Vancomycin 2250 mg IV x 1 to load 2. Vancomycin 1250 mg IV every 12 hours 3. Zosyn 3.375g IV every 8 hours (infused over 4 hours) 4. Will continue to follow renal function, culture results, LOT, and antibiotic de-escalation plans   Alycia Rossetti, PharmD, BCPS Clinical Pharmacist Pager: [phone] 01/25/2013 10:14 AM

## 2013-01-25 NOTE — Progress Notes (Signed)
UR COMPLETED  

## 2013-01-26 ENCOUNTER — Encounter: Payer: BC Managed Care – PPO | Admitting: Internal Medicine

## 2013-01-26 LAB — BASIC METABOLIC PANEL
Calcium: 8.4 mg/dL (ref 8.4–10.5)
Creatinine, Ser: 1.33 mg/dL (ref 0.50–1.35)
GFR calc Af Amer: 72 mL/min — ABNORMAL LOW (ref >90)

## 2013-01-26 LAB — GLUCOSE, CAPILLARY
Glucose-Capillary: 317 mg/dL — ABNORMAL HIGH (ref 70–99)
Glucose-Capillary: 377 mg/dL — ABNORMAL HIGH (ref 70–99)

## 2013-01-26 LAB — CULTURE, RESPIRATORY W GRAM STAIN: Culture: NORMAL

## 2013-01-26 LAB — CBC
Platelets: 124 10*3/uL — ABNORMAL LOW (ref 150–400)
RDW: 13.7 % (ref 11.5–15.5)
WBC: 12.7 10*3/uL — ABNORMAL HIGH (ref 4.0–10.5)

## 2013-01-26 LAB — EXPECTORATED SPUTUM ASSESSMENT W GRAM STAIN, RFLX TO RESP C

## 2013-01-26 MED ORDER — AMLODIPINE BESYLATE 5 MG PO TABS
5.0000 mg | ORAL_TABLET | Freq: Every day | ORAL | Status: DC
  Administered 2013-01-26: 5 mg via ORAL
  Filled 2013-01-26 (×2): qty 1

## 2013-01-26 MED ORDER — INSULIN ASPART 100 UNIT/ML ~~LOC~~ SOLN: 7.0000 [IU] | Freq: Three times a day (TID) | SUBCUTANEOUS | Status: DC

## 2013-01-26 MED ORDER — INSULIN ASPART 100 UNIT/ML ~~LOC~~ SOLN
10.0000 [IU] | Freq: Three times a day (TID) | SUBCUTANEOUS | Status: DC
  Administered 2013-01-27 (×3): 10 [IU] via SUBCUTANEOUS

## 2013-01-26 MED ORDER — INSULIN GLARGINE 100 UNIT/ML ~~LOC~~ SOLN
10.0000 [IU] | Freq: Once | SUBCUTANEOUS | Status: AC
  Administered 2013-01-26: 10 [IU] via SUBCUTANEOUS
  Filled 2013-01-26 (×2): qty 0.1

## 2013-01-26 MED ORDER — MORPHINE SULFATE 2 MG/ML IJ SOLN
2.0000 mg | INTRAMUSCULAR | Status: DC | PRN
  Administered 2013-01-26 – 2013-01-27 (×3): 2 mg via INTRAVENOUS
  Filled 2013-01-26 (×3): qty 1

## 2013-01-26 MED ORDER — INSULIN GLARGINE 100 UNIT/ML ~~LOC~~ SOLN
50.0000 [IU] | Freq: Two times a day (BID) | SUBCUTANEOUS | Status: DC
  Administered 2013-01-26: 50 [IU] via SUBCUTANEOUS
  Filled 2013-01-26 (×3): qty 0.5

## 2013-01-26 NOTE — Care Management Note (Signed)
    Page 1 of 1   01/30/2013     4:45:43 PM   CARE MANAGEMENT NOTE 01/30/2013  Patient:  Jerry Cantrell, Jerry Cantrell   Account Number:  [id number]  Date Initiated:  01/26/2013  Documentation initiated by:  Tomi Bamberger  Subjective/Objective Assessment:   dx sepsis  admit- lives with spouse.     Action/Plan:   Anticipated DC Date:  01/30/2013   Anticipated DC Plan:  [place]  CM consult      Choice offered to / List presented to:             Status of service:  Completed, signed off Medicare Important Message given?   (If response is "NO", the following Medicare IM given date fields will be blank) Date Medicare IM given:   Date Additional Medicare IM given:    Discharge Disposition:  HOME/SELF CARE  Per UR Regulation:  Reviewed for med. necessity/level of care/duration of stay  If discussed at [place] of Stay Meetings, dates discussed:    Comments:  01/30/13- 1200- Marvetta Gibbons RN, BSN [phone] Referral received for medication assistance and CPAP for home- spoke with pt at bedside- confirmed that pt has [employer]- pt states that some of his copays are high- explained that he is not eligible for any assistance through the hospital because he has insurance- pt verbalizes understanding- discussed $4 meds- and pt states MD is looking at meds and how they can get as many as possible on $4 list- also are changing is insulin to a cheaper insulin to help with cost- pt states that he has syringes at home. Pt is on Lyrica- pt co-pay assistance card given to pt to see if he might can use at his pharmacy- also arranged Sleep study with [place] for June 9th- order faxed to [place]. CPAP ordered and arranged with [facility]-  01/26/13 15:33 Tomi Bamberger RN, BSN [phone] patient lives with spouse, admitted with sepsis, sputum and bld cx is pending.  NCM will continue to follow for dc needs.

## 2013-01-26 NOTE — Progress Notes (Signed)
Visit to patient while in hospital. Will call after she is discharged to assist with transition of care. 

## 2013-01-26 NOTE — Progress Notes (Signed)
Internal Medicine Teaching Service Attending Note Date: 01/26/2013  Patient name: Jerry Cantrell  Medical record number: [id number]  Date of birth: 1966/02/08    This patient has been seen and discussed with the house staff. Please see their note for complete details. I concur with their findings with the following additions/corrections:  [name] has sepsis from respiratory source.  Sputum and blood cultures are pending.  He is slowing improving on broadened Abx.  Will narrow as soon as culture results report available. His DM is not well controlled, increasing insulin today.    [name] 01/26/2013, 3:20 PM

## 2013-01-26 NOTE — Progress Notes (Signed)
Subjective: Switched to Vanc/Zosyn yesterday. Breathing improved. No further chest pain. SOB improving. Pt states that he is feeling much better.  Objective: Vital signs in last 24 hours: Filed Vitals:   01/26/13 0130 01/26/13 0204 01/26/13 0500 01/26/13 1024  BP:  149/75  170/90  Pulse:  89  90  Temp: 99 F (37.2 C) 100 F (37.8 C)    TempSrc: Oral Oral    Resp:  21    Height:      Weight:   300 lb 14.9 oz (136.5 kg)   SpO2:  95%     Weight change: -1 lb 1.2 oz (-0.486 kg)  Intake/Output Summary (Last 24 hours) at 01/26/13 1207 Last data filed at 01/26/13 0900  Gross per 24 hour  Intake   1430 ml  Output   1425 ml  Net      5 ml   Vitals reviewed. General: Resting in bed, no respiratory distress HEENT: PERRL, EOMI, no scleral icterus Cardiac: RRR, no rubs, murmurs or gallops Pulm: Diminished breath sounds in bases bilaterally, crackles in LLL, no appreciable wheezes Abd: Obese, disteneded, nontender, BS present Ext: Warm and well perfused, no edema noted today Neuro: Alert and oriented X3, cranial nerves II-XII grossly intact, strength intact in BLU&LE  Lab Results: Basic Metabolic Panel:  Recent Labs Lab 01/25/13 0625 01/26/13 0500  NA 134* 135  K 4.0 3.8  CL 98 97  CO2 24 25  GLUCOSE 367* 318*  BUN 22 18  CREATININE 1.32 1.33  CALCIUM 8.2* 8.4   Liver Function Tests:  Recent Labs Lab 01/24/13 1320  AST 18  ALT 14  ALKPHOS 114  BILITOT 0.5  PROT 6.3  ALBUMIN 2.3*    Recent Labs Lab 01/24/13 1320  LIPASE 21   CBC:  Recent Labs Lab 01/24/13 1321 01/25/13 0625 01/26/13 0500  WBC 13.6* 14.7* 12.7*  NEUTROABS 11.3*  --   --   HGB 14.7 13.4 12.8*  HCT 42.8 40.1 39.9  MCV 89.4 90.7 93.4  PLT 123* 122* 124*   Cardiac Enzymes:  Recent Labs Lab 01/24/13 1934 01/24/13 2226 01/25/13 0630  CKTOTAL  --  193  --   CKMB  --  1.5  --   TROPONINI <0.30 <0.30 <0.30   CBG:  Recent Labs Lab 01/24/13 2358 01/25/13 0752 01/25/13 1231  01/25/13 1711 01/25/13 2046 01/26/13 0748  GLUCAP 431* 320* 234* 243* 311* 287*   Urine Drug Screen: Drugs of Abuse     Component Value Date/Time   LABOPIA NEG 06/02/2012 0945   LABOPIA NONE DETECTED 03/05/2010 0940   COCAINSCRNUR NEG 06/02/2012 0945   COCAINSCRNUR NONE DETECTED 03/05/2010 0940   LABBENZ NEG 06/02/2012 0945   LABBENZ NONE DETECTED 03/05/2010 0940   LABBENZ NEG 02/18/2010 2054   AMPHETMU NONE DETECTED 03/05/2010 0940   AMPHETMU NEG 02/18/2010 2054   THCU NONE DETECTED 03/05/2010 0940   LABBARB NEG 06/02/2012 0945   LABBARB  Value: NONE DETECTED        DRUG SCREEN FOR MEDICAL PURPOSES ONLY.  IF CONFIRMATION IS NEEDED FOR ANY PURPOSE, NOTIFY LAB WITHIN 5 DAYS.        LOWEST DETECTABLE LIMITS FOR URINE DRUG SCREEN Drug Class       Cutoff (ng/mL) Amphetamine      1000 Barbiturate      200 Benzodiazepine   [id number] Tricyclics       [id number] Opiates          300 Cocaine  300 THC              50 03/05/2010 0940    Urinalysis: No results found for this basename: COLORURINE, APPERANCEUR, LABSPEC, PHURINE, GLUCOSEU, HGBUR, BILIRUBINUR, KETONESUR, PROTEINUR, UROBILINOGEN, NITRITE, LEUKOCYTESUR,  in the last 168 hours   Micro Results: Recent Results (from the past 240 hour(s))  CULTURE, RESPIRATORY (NON-EXPECTORATED)     Status: None   Collection Time    01/24/13  5:40 PM      Result Value Range Status   Specimen Description SPUTUM   Final   Special Requests NONE   Final   Gram Stain     Final   Value: RARE WBC PRESENT,BOTH PMN AND MONONUCLEAR     RARE SQUAMOUS EPITHELIAL CELLS PRESENT     MODERATE GRAM POSITIVE COCCI     IN PAIRS   Culture NORMAL OROPHARYNGEAL FLORA   Final   Report Status 01/26/2013 FINAL   Final  CULTURE, BLOOD (ROUTINE X 2)     Status: None   Collection Time    01/24/13  6:44 PM      Result Value Range Status   Specimen Description BLOOD RIGHT FOREARM   Final   Special Requests BOTTLES DRAWN AEROBIC AND ANAEROBIC 10CC   Final   Culture  Setup Time 01/25/2013 05:08    Final   Culture     Final   Value:        BLOOD CULTURE RECEIVED NO GROWTH TO DATE CULTURE WILL BE HELD FOR 5 DAYS BEFORE ISSUING A FINAL NEGATIVE REPORT   Report Status PENDING   Incomplete  CULTURE, BLOOD (ROUTINE X 2)     Status: None   Collection Time    01/24/13  7:34 PM      Result Value Range Status   Specimen Description BLOOD RIGHT HAND   Final   Special Requests BOTTLES DRAWN AEROBIC ONLY [facility]   Final   Culture  Setup Time 01/25/2013 02:32   Final   Culture     Final   Value:        BLOOD CULTURE RECEIVED NO GROWTH TO DATE CULTURE WILL BE HELD FOR 5 DAYS BEFORE ISSUING A FINAL NEGATIVE REPORT   Report Status PENDING   Incomplete  CULTURE, EXPECTORATED SPUTUM-ASSESSMENT     Status: None   Collection Time    01/25/13 11:12 PM      Result Value Range Status   Specimen Description SPUTUM   Final   Special Requests NONE   Final   Sputum evaluation     Final   Value: THIS SPECIMEN IS ACCEPTABLE. RESPIRATORY CULTURE REPORT TO FOLLOW.   Report Status 01/26/2013 FINAL   Final  CULTURE, RESPIRATORY (NON-EXPECTORATED)     Status: None   Collection Time    01/25/13 11:12 PM      Result Value Range Status   Specimen Description SPUTUM   Final   Special Requests NONE   Final   Gram Stain     Final   Value: FEW WBC PRESENT,BOTH PMN AND MONONUCLEAR     RARE SQUAMOUS EPITHELIAL CELLS PRESENT     FEW GRAM POSITIVE COCCI     IN PAIRS RARE GRAM POSITIVE RODS   Culture PENDING   Incomplete   Report Status PENDING   Incomplete   Studies/Results: Dg Chest 2 View  01/24/2013   *RADIOLOGY REPORT*  Clinical Data: Chest pain  CHEST - 2 VIEW  Comparison: 01/12/2013  Findings: The cardiac shadow is stable.  There is now a somewhat rounded area of increased density identified in the left lower lobe which was not seen on the prior exam most consistent with a round pneumonia.  No sizable effusion is seen.  No other focal abnormality is noted.  IMPRESSION: Changes consistent with round pneumonia.   Follow-up films following appropriate therapy are recommended.   Original Report Authenticated By: Inez Catalina, M.D.   Medications: I have reviewed the patient's current medications. Scheduled Meds: . amitriptyline  50 mg Oral QHS  . aspirin  81 mg Oral Daily  . atorvastatin  80 mg Oral q1800  . enoxaparin (LOVENOX) injection  40 mg Subcutaneous Q24H  . furosemide  20 mg Oral Daily  . insulin aspart  0-20 Units Subcutaneous TID WC  . insulin aspart  0-5 Units Subcutaneous QHS  . insulin aspart  4 Units Subcutaneous TID WC  . insulin glargine  10 Units Subcutaneous Once  . insulin glargine  50 Units Subcutaneous BID  . lisinopril  40 mg Oral Daily  . metoprolol  100 mg Oral BID  . nicotine  14 mg Transdermal Daily  . piperacillin-tazobactam (ZOSYN)  IV  3.375 g Intravenous Q8H  . pregabalin  100 mg Oral BID  . terazosin  1 mg Oral QHS  . vancomycin  1,250 mg Intravenous Q12H   Continuous Infusions:  PRN Meds:.acetaminophen, ipratropium, levalbuterol, morphine injection, nitroGLYCERIN  Assessment/Plan: 47 yo M with h/o HTN, DM, HLD & peripheral neuropathy presents on 01/24/13 with worsening SOB and chest pain.   #Sepsis:  Evidenced by leukocytosis, tachycardia, tachypnea and fever, with source of infection being CAP after outpatient treatment failure with Azithromycin. Levaquin started in the ED and continued on admission without improvement of sx. Broad spectrum coverage initiated on 5/28. SOB overnight and neb treatments changed to Xopenox and Atrovent, which do seem to have helped somewhat.  and HIV negative. Legionella Ag and Strep Pneumo urinary Ag are both negative; however, he does have respiratory cx with GPCs in pairs. BCx and expectorated sputum cx NGTD. If he continues to do well, will transition to po abx tomorrow.  - Vancy/Zosyn, day #3 - Follow blood cx (though drawn after abx)  - O2 therapy as needed for O2 Sat > 92%  - Tylenol to manage fever  - Follow up CXR in 6 weeks  to ensure resolution of PNA, especially given history of tobacco abuse   #Chest pain: Resolved  Pt reported left sided chest pain present even when not coughing. Pain not reproducible on exam. No EKG changes. Troponins negative x 3.   #DM:  Most recent A1c 7.4 on 01/17/13. Home regimen includes Lantus 40u BID & Novolog 8-10u TID with meals and HS. CBGs elevated this admission.   - Increasing Lantus to 50u BID - SSI resistant   #HTN:  Normotensive on admission, but BP elevated during hospitalization, thought initially from coughing, but BP has remained persistently elevated. Starting CCB. -Continue Lisinopril 40mg  daily  -Continue Metoprolol 100mg  BID  -Lasix 20mg  po daily -Amlodipine 5mg  po daily  #Tobacco abuse:  Continues to smoke ~10 cigarettes per day.  -Counsel on cessation by MD and RN  -Nicotine patch daily   #DVT PPx:  Lovenox, SCDs     Dispo: Disposition is deferred at this time, awaiting improvement of current medical problems.  Anticipated discharge in approximately 2-3 day(s).   The patient does have a current PCP Eulas Post, Brett Canales, MD), therefore will be requiring OPC follow-up after discharge.   The  patient does not have transportation limitations that hinder transportation to clinic appointments.  .Services Needed at time of discharge: Y = Yes, Blank = No PT:   OT:   RN:   Equipment:   Other:     LOS: 2 days   Otho Bellows 01/26/2013, 12:07 PM

## 2013-01-26 NOTE — Progress Notes (Signed)
CRITICAL VALUE ALERT  Critical value received:  Anerobic bottle Gram (+) cocci in cluster  Date of notification:  01/26/2013  Time of notification:  0130  Critical value read back:yes  Nurse who received alert:  Betti Cruz RN  MD notified (1st page):  Lethea Killings RN  Time of first page:  0132  Responding MD:  Humberto Leep RN  Time MD responded:  [phone]

## 2013-01-27 LAB — CBC
HCT: 36.8 % — ABNORMAL LOW (ref 39.0–52.0)
Hemoglobin: 11.9 g/dL — ABNORMAL LOW (ref 13.0–17.0)
MCV: 92.7 fL (ref 78.0–100.0)
RDW: 13.5 % (ref 11.5–15.5)
WBC: 8.8 10*3/uL (ref 4.0–10.5)

## 2013-01-27 LAB — BASIC METABOLIC PANEL
BUN: 16 mg/dL (ref 6–23)
CO2: 30 mEq/L (ref 19–32)
Calcium: 9.1 mg/dL (ref 8.4–10.5)
Chloride: 99 mEq/L (ref 96–112)
Creatinine, Ser: 1.29 mg/dL (ref 0.50–1.35)
Creatinine, Ser: 1.16 mg/dL (ref 0.50–1.35)
GFR calc Af Amer: 85 mL/min — ABNORMAL LOW (ref >90)

## 2013-01-27 LAB — GLUCOSE, CAPILLARY

## 2013-01-27 LAB — MAGNESIUM: Magnesium: 1.8 mg/dL (ref 1.5–2.5)

## 2013-01-27 MED ORDER — AMOXICILLIN 500 MG PO CAPS
1000.0000 mg | ORAL_CAPSULE | Freq: Three times a day (TID) | ORAL | Status: DC
  Filled 2013-01-27 (×3): qty 2

## 2013-01-27 MED ORDER — METOPROLOL TARTRATE 1 MG/ML IV SOLN: 5.0000 mg | Freq: Four times a day (QID) | INTRAVENOUS | Status: DC

## 2013-01-27 MED ORDER — HYDRALAZINE HCL 20 MG/ML IJ SOLN
5.0000 mg | Freq: Once | INTRAMUSCULAR | Status: AC
  Administered 2013-01-27: 5 mg via INTRAVENOUS
  Filled 2013-01-27: qty 1

## 2013-01-27 MED ORDER — VITAMIN B-1 100 MG PO TABS
100.0000 mg | ORAL_TABLET | Freq: Every day | ORAL | Status: DC
  Filled 2013-01-27: qty 1

## 2013-01-27 MED ORDER — INSULIN GLARGINE 100 UNIT/ML ~~LOC~~ SOLN
60.0000 [IU] | Freq: Two times a day (BID) | SUBCUTANEOUS | Status: DC
  Filled 2013-01-27: qty 0.6

## 2013-01-27 MED ORDER — FOLIC ACID 1 MG PO TABS
1.0000 mg | ORAL_TABLET | Freq: Every day | ORAL | Status: DC
  Filled 2013-01-27: qty 1

## 2013-01-27 MED ORDER — THIAMINE HCL 100 MG/ML IJ SOLN
100.0000 mg | Freq: Every day | INTRAMUSCULAR | Status: DC
  Filled 2013-01-27: qty 1

## 2013-01-27 MED ORDER — HYDRALAZINE HCL 20 MG/ML IJ SOLN: 5.0000 mg | INTRAMUSCULAR | Status: DC | PRN

## 2013-01-27 MED ORDER — INSULIN ASPART PROT & ASPART (70-30 MIX) 100 UNIT/ML ~~LOC~~ SUSP
60.0000 [IU] | Freq: Two times a day (BID) | SUBCUTANEOUS | Status: DC
  Administered 2013-01-27: 60 [IU] via SUBCUTANEOUS
  Filled 2013-01-27: qty 10

## 2013-01-27 MED ORDER — ADULT MULTIVITAMIN W/MINERALS CH
1.0000 | ORAL_TABLET | Freq: Every day | ORAL | Status: DC
  Filled 2013-01-27: qty 1

## 2013-01-27 MED ORDER — METOPROLOL TARTRATE 1 MG/ML IV SOLN
5.0000 mg | Freq: Four times a day (QID) | INTRAVENOUS | Status: DC | PRN
  Administered 2013-01-27: 5 mg via INTRAVENOUS
  Filled 2013-01-27: qty 5

## 2013-01-27 MED ORDER — AMLODIPINE BESYLATE 10 MG PO TABS
10.0000 mg | ORAL_TABLET | Freq: Every day | ORAL | Status: DC
  Administered 2013-01-27 – 2013-01-30 (×4): 10 mg via ORAL
  Filled 2013-01-27 (×5): qty 1

## 2013-01-27 MED ORDER — ENOXAPARIN SODIUM 80 MG/0.8ML ~~LOC~~ SOLN
65.0000 mg | SUBCUTANEOUS | Status: DC
  Administered 2013-01-27 – 2013-01-29 (×3): 65 mg via SUBCUTANEOUS
  Filled 2013-01-27 (×4): qty 0.8

## 2013-01-27 MED ORDER — METOPROLOL TARTRATE 1 MG/ML IV SOLN: 5.0000 mg | INTRAVENOUS | Status: DC | PRN

## 2013-01-27 MED ORDER — LORAZEPAM 1 MG PO TABS: 1.0000 mg | ORAL_TABLET | Freq: Four times a day (QID) | ORAL | Status: DC | PRN

## 2013-01-27 MED ORDER — HYDRALAZINE HCL 20 MG/ML IJ SOLN
5.0000 mg | INTRAMUSCULAR | Status: DC | PRN
  Administered 2013-01-27: 5 mg via INTRAVENOUS
  Administered 2013-01-28 (×2): 10 mg via INTRAVENOUS
  Administered 2013-01-28: 5 mg via INTRAVENOUS
  Administered 2013-01-29 – 2013-01-30 (×3): 10 mg via INTRAVENOUS
  Filled 2013-01-27 (×7): qty 1

## 2013-01-27 MED ORDER — INSULIN GLARGINE 100 UNIT/ML ~~LOC~~ SOLN
60.0000 [IU] | Freq: Two times a day (BID) | SUBCUTANEOUS | Status: DC
  Administered 2013-01-27: 60 [IU] via SUBCUTANEOUS
  Filled 2013-01-27 (×2): qty 0.6

## 2013-01-27 MED ORDER — AMOXICILLIN-POT CLAVULANATE 875-125 MG PO TABS
1.0000 | ORAL_TABLET | Freq: Two times a day (BID) | ORAL | Status: DC
  Administered 2013-01-27 – 2013-01-30 (×7): 1 via ORAL
  Filled 2013-01-27 (×8): qty 1

## 2013-01-27 MED ORDER — LORAZEPAM 2 MG/ML IJ SOLN: 1.0000 mg | Freq: Four times a day (QID) | INTRAMUSCULAR | Status: DC | PRN

## 2013-01-27 MED ORDER — HYDRALAZINE HCL 10 MG PO TABS
10.0000 mg | ORAL_TABLET | Freq: Four times a day (QID) | ORAL | Status: DC
  Filled 2013-01-27 (×3): qty 1

## 2013-01-27 NOTE — Progress Notes (Signed)
Internal Medicine Teaching Service Attending Note Date: 01/27/2013  Patient name: Jerry Cantrell  Medical record number: [id number]  Date of birth: Sep 24, 1965    This patient has been seen and discussed with the house staff. Please see their note for complete details. I concur with their findings with the following additions/corrections:  Sepsis/CAP: Will transition to PO abx today with augmentin for a total of 8 days of therapy.  WBC has improved to normal today.  Sputum and Blood cultures pending.  BC are NGTD.   For his DM, he will need high doses of insulin.  Will need to carefully monitor and ensure no hypoglycemia after acute infection resolves.  His BP has also been difficult to manage.  Uptitrating amlodipine today.  Can consider adding hydralazine vs. Clonidine or up-titrating his lasix for further better control.   Shona Pardo 01/27/2013, 2:26 PM

## 2013-01-27 NOTE — Progress Notes (Signed)
Unaware of 0500 BP took by nurse tech of 185/96 HR 88.  Rechecked BP at 0659: BP 181/104 HR 87. Notified Glenn, MD of BP 181/104 HR 87.  Eulas Post, MD stated to give 1000 BP meds now. Notified pharmacy to get 1000 BP meds. Denyse Amass, dayshift RN aware to give 1000 BP meds now.  Will continue to monitor patient. Ranelle Oyster, RN

## 2013-01-27 NOTE — Progress Notes (Addendum)
Subjective: Continues to do well. Pt states that he is feeling much better. C/o pleuritic chest pain when he breathes at the base of his lungs.  Objective: Vital signs in last 24 hours: Filed Vitals:   01/26/13 2257 01/27/13 0104 01/27/13 0500 01/27/13 0659  BP: 156/73  185/96 181/104  Pulse: 96  88 87  Temp:  98.9 F (37.2 C) 99.6 F (37.6 C)   TempSrc:  Oral Oral   Resp:   20   Height:      Weight:   278 lb 3.5 oz (126.2 kg)   SpO2:   95%    Weight change: -22 lb 11.3 oz (-10.3 kg)  Intake/Output Summary (Last 24 hours) at 01/27/13 1009 Last data filed at 01/27/13 0503  Gross per 24 hour  Intake   1248 ml  Output   1650 ml  Net   -402 ml   Vitals reviewed. General: Resting in bed, no respiratory distress HEENT: PERRL, EOMI, no scleral icterus Cardiac: RRR, no rubs, murmurs or gallops Pulm: Improved breath sounds in bases bilaterally, crackles in LLL, no appreciable wheezes Abd: Obese, disteneded, nontender, BS present Ext: Warm and well perfused, no edema noted today Neuro: Alert and oriented X3, cranial nerves II-XII grossly intact, strength intact in BLU&LE  Lab Results: Basic Metabolic Panel:  Recent Labs Lab 01/26/13 0500 01/27/13 0532  NA 135 137  K 3.8 4.0  CL 97 99  CO2 25 30  GLUCOSE 318* 280*  BUN 18 17  CREATININE 1.33 1.29  CALCIUM 8.4 9.2   Liver Function Tests:  Recent Labs Lab 01/24/13 1320  AST 18  ALT 14  ALKPHOS 114  BILITOT 0.5  PROT 6.3  ALBUMIN 2.3*    Recent Labs Lab 01/24/13 1320  LIPASE 21   CBC:  Recent Labs Lab 01/24/13 1321  01/26/13 0500 01/27/13 0532  WBC 13.6*  < > 12.7* 8.8  NEUTROABS 11.3*  --   --   --   HGB 14.7  < > 12.8* 11.9*  HCT 42.8  < > 39.9 36.8*  MCV 89.4  < > 93.4 92.7  PLT 123*  < > 124* 139*  < > = values in this interval not displayed. Cardiac Enzymes:  Recent Labs Lab 01/24/13 1934 01/24/13 2226 01/25/13 0630  CKTOTAL  --  193  --   CKMB  --  1.5  --   TROPONINI <0.30 <0.30  <0.30   CBG:  Recent Labs Lab 01/25/13 2046 01/26/13 0748 01/26/13 1202 01/26/13 1656 01/26/13 2135 01/27/13 0759  GLUCAP 311* 287* 374* 317* 377* 223*   Urine Drug Screen: Drugs of Abuse     Component Value Date/Time   LABOPIA NEG 06/02/2012 0945   LABOPIA NONE DETECTED 03/05/2010 0940   COCAINSCRNUR NEG 06/02/2012 0945   COCAINSCRNUR NONE DETECTED 03/05/2010 0940   LABBENZ NEG 06/02/2012 0945   LABBENZ NONE DETECTED 03/05/2010 0940   LABBENZ NEG 02/18/2010 2054   AMPHETMU NONE DETECTED 03/05/2010 0940   AMPHETMU NEG 02/18/2010 2054   THCU NONE DETECTED 03/05/2010 0940   LABBARB NEG 06/02/2012 0945   LABBARB  Value: NONE DETECTED        DRUG SCREEN FOR MEDICAL PURPOSES ONLY.  IF CONFIRMATION IS NEEDED FOR ANY PURPOSE, NOTIFY LAB WITHIN 5 DAYS.        LOWEST DETECTABLE LIMITS FOR URINE DRUG SCREEN Drug Class       Cutoff (ng/mL) Amphetamine      1000 Barbiturate  200 Benzodiazepine   [id number] Tricyclics       [id number] Opiates          300 Cocaine          300 THC              50 03/05/2010 0940    Urinalysis: No results found for this basename: COLORURINE, APPERANCEUR, LABSPEC, PHURINE, GLUCOSEU, HGBUR, BILIRUBINUR, KETONESUR, PROTEINUR, UROBILINOGEN, NITRITE, LEUKOCYTESUR,  in the last 168 hours   Micro Results: Recent Results (from the past 240 hour(s))  CULTURE, RESPIRATORY (NON-EXPECTORATED)     Status: None   Collection Time    01/24/13  5:40 PM      Result Value Range Status   Specimen Description SPUTUM   Final   Special Requests NONE   Final   Gram Stain     Final   Value: RARE WBC PRESENT,BOTH PMN AND MONONUCLEAR     RARE SQUAMOUS EPITHELIAL CELLS PRESENT     MODERATE GRAM POSITIVE COCCI     IN PAIRS   Culture NORMAL OROPHARYNGEAL FLORA   Final   Report Status 01/26/2013 FINAL   Final  CULTURE, BLOOD (ROUTINE X 2)     Status: None   Collection Time    01/24/13  6:44 PM      Result Value Range Status   Specimen Description BLOOD RIGHT FOREARM   Final   Special Requests BOTTLES  DRAWN AEROBIC AND ANAEROBIC 10CC   Final   Culture  Setup Time 01/25/2013 05:08   Final   Culture     Final   Value:        BLOOD CULTURE RECEIVED NO GROWTH TO DATE CULTURE WILL BE HELD FOR 5 DAYS BEFORE ISSUING A FINAL NEGATIVE REPORT   Report Status PENDING   Incomplete  CULTURE, BLOOD (ROUTINE X 2)     Status: None   Collection Time    01/24/13  7:34 PM      Result Value Range Status   Specimen Description BLOOD RIGHT HAND   Final   Special Requests BOTTLES DRAWN AEROBIC ONLY [facility]   Final   Culture  Setup Time 01/25/2013 02:32   Final   Culture     Final   Value:        BLOOD CULTURE RECEIVED NO GROWTH TO DATE CULTURE WILL BE HELD FOR 5 DAYS BEFORE ISSUING A FINAL NEGATIVE REPORT   Report Status PENDING   Incomplete  CULTURE, EXPECTORATED SPUTUM-ASSESSMENT     Status: None   Collection Time    01/25/13 11:12 PM      Result Value Range Status   Specimen Description SPUTUM   Final   Special Requests NONE   Final   Sputum evaluation     Final   Value: THIS SPECIMEN IS ACCEPTABLE. RESPIRATORY CULTURE REPORT TO FOLLOW.   Report Status 01/26/2013 FINAL   Final  CULTURE, RESPIRATORY (NON-EXPECTORATED)     Status: None   Collection Time    01/25/13 11:12 PM      Result Value Range Status   Specimen Description SPUTUM   Final   Special Requests NONE   Final   Gram Stain     Final   Value: FEW WBC PRESENT,BOTH PMN AND MONONUCLEAR     RARE SQUAMOUS EPITHELIAL CELLS PRESENT     FEW GRAM POSITIVE COCCI     IN PAIRS RARE GRAM POSITIVE RODS   Culture NORMAL OROPHARYNGEAL FLORA   Final   Report Status PENDING  Incomplete   Studies/Results: No results found. Medications: I have reviewed the patient's current medications. Scheduled Meds: . amitriptyline  50 mg Oral QHS  . amLODipine  10 mg Oral Daily  . aspirin  81 mg Oral Daily  . atorvastatin  80 mg Oral q1800  . enoxaparin (LOVENOX) injection  65 mg Subcutaneous Q24H  . furosemide  20 mg Oral Daily  . insulin aspart  0-20 Units  Subcutaneous TID WC  . insulin aspart  0-5 Units Subcutaneous QHS  . insulin aspart  10 Units Subcutaneous TID WC  . insulin glargine  50 Units Subcutaneous BID  . lisinopril  40 mg Oral Daily  . metoprolol  100 mg Oral BID  . nicotine  14 mg Transdermal Daily  . piperacillin-tazobactam (ZOSYN)  IV  3.375 g Intravenous Q8H  . pregabalin  100 mg Oral BID  . terazosin  1 mg Oral QHS  . vancomycin  1,250 mg Intravenous Q12H   Continuous Infusions:  PRN Meds:.acetaminophen, ipratropium, levalbuterol, morphine injection, nitroGLYCERIN  Assessment/Plan: 47 yo M with h/o HTN, DM, HLD & peripheral neuropathy presents on 01/24/13 with worsening SOB and chest pain.   #Sepsis:  Evidenced by leukocytosis, tachycardia, tachypnea and fever, with source of infection being CAP after outpatient treatment failure with Azithromycin. Levaquin started in the ED and continued on admission without improvement of sx. Broad spectrum coverage initiated on 5/28. SOB overnight and neb treatments changed to Xopenox and Atrovent, which do seem to have helped somewhat.  and HIV negative. Legionella Ag and Strep Pneumo urinary Ag are both negative; however, he does have respiratory cx positive with normal flora. BCx and expectorated sputum cx NGTD.Transitioning to po abx today.  - Amoxicillin 1g po TID, day #4/10 - Follow blood cx (though drawn after abx)  - O2 therapy as needed for O2 Sat > 92%  - Tylenol to manage fever  - Follow up CXR in 6 weeks to ensure resolution of PNA, especially given history of tobacco abuse   #Chest pain: Resolved  Pt reported left sided chest pain present even when not coughing. Pain not reproducible on exam. No EKG changes. Troponins negative x 3.   #DM:  Most recent A1c 7.4 on 01/17/13. Home regimen includes Lantus 40u BID & Novolog 8-10u TID with meals and HS. CBGs elevated this admission, but improving.   - Increasing Lantus to 60u BID, will change over to 70/30 tonight, as it is more  affordable as an outpatient. - SSI resistant   #HTN:  Normotensive on admission, but BP elevated during hospitalization, thought initially from coughing, but BP has remained persistently elevated. Started [place] 5/29, increasing dose today. -Continue Lisinopril 40mg  daily  -Continue Metoprolol 100mg  BID  -Lasix 20mg  po daily -Increasing Amlodipine to 10mg  po daily  #AKI: Cr 1.32 on admission. Possibly prerenal. Down to 1.29 with IVF and improved po intake. - AM BMP  #Tobacco abuse:  Continues to smoke ~10 cigarettes per day.  -Counsel on cessation by MD and RN  -Nicotine patch daily   #DVT PPx:  Lovenox, SCDs     Dispo: Disposition is deferred at this time, awaiting improvement of current medical problems.  Possible d/c to home tomorrow if doing well on po antibiotics.  The patient does have a current PCP Eulas Post, Brett Canales, MD), therefore will be requiring OPC follow-up after discharge.   The patient does not have transportation limitations that hinder transportation to clinic appointments.  .Services Needed at time of discharge: Y = Yes,  Blank = No PT:   OT:   RN:   Equipment:   Other:     LOS: 3 days   Clayburn Pert F 01/27/2013, 10:09 AM

## 2013-01-27 NOTE — Progress Notes (Addendum)
Called by RN for HTN.   S:  Patient states BP is normally 170s-200s/80s-110.  He has a h/a at the crown of his head x 4-5 days.  He also reports his glucose levels run in the 200s.  Patient drinks a 12 pack in 1 week. Denies acute changes in vision from baseline.   Notified by RN Denyse Amass that the patient had 2nd degree heart block ?type 1 vs 2 temporarily today but resolved to NSR   A/P 1. HTN, uncontrolled Chronic for patient.  Do not drop BP too rapidly.  Less likely alcohol w/d sx's Prn Hydralazine 5 mg increased to 5-10 mg q 4hour for SBP>170 or DBP>100. hold for HR >95 and notify MD Primary team to address in the am.     2. H/a Prn Tylenol  3. F/u BMET, Mag due to 2nd degree heart block   Aundra Dubin MD [phone]

## 2013-01-27 NOTE — Progress Notes (Signed)
Pt had a brief run 2nd degree HB, Type II. Pt is assymptomatic other than a dull headache all day. Dr Eulas Post notified.

## 2013-01-28 ENCOUNTER — Inpatient Hospital Stay (HOSPITAL_COMMUNITY): Payer: BC Managed Care – PPO

## 2013-01-28 DIAGNOSIS — I509 Heart failure, unspecified: Secondary | ICD-10-CM

## 2013-01-28 DIAGNOSIS — I455 Other specified heart block: Secondary | ICD-10-CM

## 2013-01-28 DIAGNOSIS — R7309 Other abnormal glucose: Secondary | ICD-10-CM

## 2013-01-28 DIAGNOSIS — I472 Ventricular tachycardia: Secondary | ICD-10-CM

## 2013-01-28 LAB — CBC
HCT: 41 % (ref 39.0–52.0)
Hemoglobin: 13.7 g/dL (ref 13.0–17.0)
MCHC: 33.4 g/dL (ref 30.0–36.0)
MCV: 91.3 fL (ref 78.0–100.0)
RDW: 13.4 % (ref 11.5–15.5)
WBC: 7.9 10*3/uL (ref 4.0–10.5)

## 2013-01-28 LAB — CULTURE, RESPIRATORY W GRAM STAIN: Culture: NORMAL

## 2013-01-28 LAB — GLUCOSE, CAPILLARY: Glucose-Capillary: 303 mg/dL — ABNORMAL HIGH (ref 70–99)

## 2013-01-28 LAB — BASIC METABOLIC PANEL
BUN: 14 mg/dL (ref 6–23)
CO2: 31 mEq/L (ref 19–32)
Chloride: 100 mEq/L (ref 96–112)
Creatinine, Ser: 1.1 mg/dL (ref 0.50–1.35)
Potassium: 3.9 mEq/L (ref 3.5–5.1)

## 2013-01-28 LAB — PRO B NATRIURETIC PEPTIDE: Pro B Natriuretic peptide (BNP): 453.4 pg/mL — ABNORMAL HIGH (ref 0–125)

## 2013-01-28 MED ORDER — HYDROCHLOROTHIAZIDE 25 MG PO TABS
25.0000 mg | ORAL_TABLET | Freq: Every day | ORAL | Status: DC
  Administered 2013-01-28: 25 mg via ORAL
  Filled 2013-01-28 (×2): qty 1

## 2013-01-28 MED ORDER — KETOROLAC TROMETHAMINE 15 MG/ML IJ SOLN
15.0000 mg | Freq: Once | INTRAMUSCULAR | Status: AC
  Administered 2013-01-28: 15 mg via INTRAVENOUS
  Filled 2013-01-28 (×2): qty 1

## 2013-01-28 MED ORDER — INSULIN ASPART PROT & ASPART (70-30 MIX) 100 UNIT/ML ~~LOC~~ SUSP
65.0000 [IU] | Freq: Two times a day (BID) | SUBCUTANEOUS | Status: DC
  Administered 2013-01-28 – 2013-01-29 (×2): 65 [IU] via SUBCUTANEOUS
  Filled 2013-01-28 (×2): qty 10

## 2013-01-28 MED ORDER — CARVEDILOL 12.5 MG PO TABS
12.5000 mg | ORAL_TABLET | Freq: Two times a day (BID) | ORAL | Status: DC
  Administered 2013-01-28 – 2013-01-29 (×2): 12.5 mg via ORAL
  Filled 2013-01-28 (×4): qty 1

## 2013-01-28 MED ORDER — TRAMADOL HCL 50 MG PO TABS
100.0000 mg | ORAL_TABLET | Freq: Once | ORAL | Status: AC
  Administered 2013-01-28: 100 mg via ORAL
  Filled 2013-01-28: qty 2

## 2013-01-28 MED ORDER — ACETAMINOPHEN 325 MG PO TABS
650.0000 mg | ORAL_TABLET | Freq: Four times a day (QID) | ORAL | Status: DC | PRN
  Administered 2013-01-28 – 2013-01-29 (×3): 650 mg via ORAL
  Filled 2013-01-28 (×4): qty 2

## 2013-01-28 MED ORDER — MAGNESIUM SULFATE 40 MG/ML IJ SOLN
2.0000 g | Freq: Once | INTRAMUSCULAR | Status: AC
  Administered 2013-01-28: 2 g via INTRAVENOUS
  Filled 2013-01-28: qty 50

## 2013-01-28 MED ORDER — FUROSEMIDE 80 MG PO TABS
80.0000 mg | ORAL_TABLET | Freq: Once | ORAL | Status: AC
  Administered 2013-01-28: 80 mg via ORAL
  Filled 2013-01-28: qty 1

## 2013-01-28 NOTE — Progress Notes (Signed)
Subjective: Continues to do well. Pt states that his breathing is much better, some hemoptysis but improving. No longer having pleuritic chest pain. C/o headaches that have been present since admission.  Objective: Vital signs in last 24 hours: Filed Vitals:   01/27/13 2312 01/28/13 0513 01/28/13 0624 01/28/13 0646  BP: 176/81 177/93 157/94   Pulse: 87 83 94   Temp:  98.5 F (36.9 C)    TempSrc:  Oral    Resp:  22    Height:      Weight:    300 lb 7.8 oz (136.3 kg)  SpO2:  95%     Weight change: 22 lb 4.3 oz (10.1 kg)  Intake/Output Summary (Last 24 hours) at 01/28/13 0928 Last data filed at 01/27/13 2311  Gross per 24 hour  Intake    580 ml  Output    150 ml  Net    430 ml   Vitals reviewed. General: Resting in bed, no respiratory distress HEENT: PERRL, EOMI, no scleral icterus Cardiac: RRR, no rubs, murmurs or gallops Pulm: Improved breath sounds in bases bilaterally, crackles in LLL, no appreciable wheezes Abd: Obese, disteneded, nontender, BS present Ext: Warm and well perfused, no edema noted today Neuro: Alert and oriented X3, cranial nerves II-XII grossly intact, strength intact in BLU&LE  Lab Results: Basic Metabolic Panel:  Recent Labs Lab 01/27/13 0532 01/27/13 1954 01/28/13 0630  NA 137 142 142  K 4.0 4.0 3.9  CL 99 101 100  CO2 30 33* 31  GLUCOSE 280* 217* 82  BUN 17 16 14   CREATININE 1.29 1.16 1.10  CALCIUM 9.2 9.1 9.6  MG  --  1.8  --    Liver Function Tests:  Recent Labs Lab 01/24/13 1320  AST 18  ALT 14  ALKPHOS 114  BILITOT 0.5  PROT 6.3  ALBUMIN 2.3*    Recent Labs Lab 01/24/13 1320  LIPASE 21   CBC:  Recent Labs Lab 01/24/13 1321  01/27/13 0532 01/28/13 0630  WBC 13.6*  < > 8.8 7.9  NEUTROABS 11.3*  --   --   --   HGB 14.7  < > 11.9* 13.7  HCT 42.8  < > 36.8* 41.0  MCV 89.4  < > 92.7 91.3  PLT 123*  < > 139* 192  < > = values in this interval not displayed. Cardiac Enzymes:  Recent Labs Lab 01/24/13 1934  01/24/13 2226 01/25/13 0630  CKTOTAL  --  193  --   CKMB  --  1.5  --   TROPONINI <0.30 <0.30 <0.30   CBG:  Recent Labs Lab 01/27/13 0759 01/27/13 1211 01/27/13 1406 01/27/13 1719 01/27/13 2214 01/28/13 0800  GLUCAP 223* 220* 258* 163* 134* 74   Urine Drug Screen: Drugs of Abuse     Component Value Date/Time   LABOPIA NEG 06/02/2012 0945   LABOPIA NONE DETECTED 03/05/2010 0940   COCAINSCRNUR NEG 06/02/2012 0945   COCAINSCRNUR NONE DETECTED 03/05/2010 0940   LABBENZ NEG 06/02/2012 0945   LABBENZ NONE DETECTED 03/05/2010 0940   LABBENZ NEG 02/18/2010 2054   AMPHETMU NONE DETECTED 03/05/2010 0940   AMPHETMU NEG 02/18/2010 2054   THCU NONE DETECTED 03/05/2010 0940   LABBARB NEG 06/02/2012 0945   LABBARB  Value: NONE DETECTED        DRUG SCREEN FOR MEDICAL PURPOSES ONLY.  IF CONFIRMATION IS NEEDED FOR ANY PURPOSE, NOTIFY LAB WITHIN 5 DAYS.        LOWEST DETECTABLE LIMITS FOR URINE DRUG  SCREEN Drug Class       Cutoff (ng/mL) Amphetamine      1000 Barbiturate      200 Benzodiazepine   [id number] Tricyclics       [id number] Opiates          300 Cocaine          300 THC              50 03/05/2010 0940    Urinalysis: No results found for this basename: COLORURINE, APPERANCEUR, LABSPEC, PHURINE, GLUCOSEU, HGBUR, BILIRUBINUR, KETONESUR, PROTEINUR, UROBILINOGEN, NITRITE, LEUKOCYTESUR,  in the last 168 hours   Micro Results: Recent Results (from the past 240 hour(s))  CULTURE, RESPIRATORY (NON-EXPECTORATED)     Status: None   Collection Time    01/24/13  5:40 PM      Result Value Range Status   Specimen Description SPUTUM   Final   Special Requests NONE   Final   Gram Stain     Final   Value: RARE WBC PRESENT,BOTH PMN AND MONONUCLEAR     RARE SQUAMOUS EPITHELIAL CELLS PRESENT     MODERATE GRAM POSITIVE COCCI     IN PAIRS   Culture NORMAL OROPHARYNGEAL FLORA   Final   Report Status 01/26/2013 FINAL   Final  CULTURE, BLOOD (ROUTINE X 2)     Status: None   Collection Time    01/24/13  6:44 PM      Result  Value Range Status   Specimen Description BLOOD RIGHT FOREARM   Final   Special Requests BOTTLES DRAWN AEROBIC AND ANAEROBIC 10CC   Final   Culture  Setup Time 01/25/2013 05:08   Final   Culture     Final   Value:        BLOOD CULTURE RECEIVED NO GROWTH TO DATE CULTURE WILL BE HELD FOR 5 DAYS BEFORE ISSUING A FINAL NEGATIVE REPORT   Report Status PENDING   Incomplete  CULTURE, BLOOD (ROUTINE X 2)     Status: None   Collection Time    01/24/13  7:34 PM      Result Value Range Status   Specimen Description BLOOD RIGHT HAND   Final   Special Requests BOTTLES DRAWN AEROBIC ONLY [facility]   Final   Culture  Setup Time 01/25/2013 02:32   Final   Culture     Final   Value:        BLOOD CULTURE RECEIVED NO GROWTH TO DATE CULTURE WILL BE HELD FOR 5 DAYS BEFORE ISSUING A FINAL NEGATIVE REPORT   Report Status PENDING   Incomplete  CULTURE, EXPECTORATED SPUTUM-ASSESSMENT     Status: None   Collection Time    01/25/13 11:12 PM      Result Value Range Status   Specimen Description SPUTUM   Final   Special Requests NONE   Final   Sputum evaluation     Final   Value: THIS SPECIMEN IS ACCEPTABLE. RESPIRATORY CULTURE REPORT TO FOLLOW.   Report Status 01/26/2013 FINAL   Final  CULTURE, RESPIRATORY (NON-EXPECTORATED)     Status: None   Collection Time    01/25/13 11:12 PM      Result Value Range Status   Specimen Description SPUTUM   Final   Special Requests NONE   Final   Gram Stain     Final   Value: FEW WBC PRESENT,BOTH PMN AND MONONUCLEAR     RARE SQUAMOUS EPITHELIAL CELLS PRESENT     FEW GRAM POSITIVE COCCI  IN PAIRS RARE GRAM POSITIVE RODS   Culture NORMAL OROPHARYNGEAL FLORA   Final   Report Status PENDING   Incomplete   Studies/Results: No results found. Medications: I have reviewed the patient's current medications. Scheduled Meds: . amitriptyline  50 mg Oral QHS  . amLODipine  10 mg Oral Daily  . amoxicillin-clavulanate  1 tablet Oral Q12H  . aspirin  81 mg Oral Daily  .  atorvastatin  80 mg Oral q1800  . enoxaparin (LOVENOX) injection  65 mg Subcutaneous Q24H  . furosemide  20 mg Oral Daily  . insulin aspart  0-20 Units Subcutaneous TID WC  . insulin aspart  0-5 Units Subcutaneous QHS  . insulin aspart protamine- aspart  65 Units Subcutaneous BID WC  . lisinopril  40 mg Oral Daily  . metoprolol  100 mg Oral BID  . nicotine  14 mg Transdermal Daily  . pregabalin  100 mg Oral BID  . terazosin  1 mg Oral QHS   Continuous Infusions:  PRN Meds:.acetaminophen, hydrALAZINE, ipratropium, levalbuterol, nitroGLYCERIN  Assessment/Plan: 47 yo M with h/o HTN, DM, HLD & peripheral neuropathy presents on 01/24/13 with worsening SOB and chest pain.   #HTN:  Normotensive on admission, but BP elevated during hospitalization, thought initially from coughing, but BP has remained persistently elevated. Also c/o headache, likely from HTN. Started [facility] on 5/29. Starting HCTZ today and stopping Lasix. - Continue Lisinopril 40mg  daily  - Continue Metoprolol 100mg  BID  - Amlodipine to 10mg  po daily - Stopping Lasix 20mg  po daily - Starting 25mg  po HCTZ - Hydralazine 5-10mg  IV q4h PRN for SBP>180 or DBP>100, hold for HR>95  #DM:  Most recent A1c 7.4 on 01/17/13. Home regimen includes Lantus 40u BID & Novolog 8-10u TID with meals and HS. CBGs elevated this admission, but improving. Changed Lantus to 70/30 on 5/30, as it is more affordable as an outpatient. CBGs are greatly improved. Due to his difficulty affording the Novolog, increasing the 70/30 to 65u, and stopping the meal time coverage. Still with SSI but not requiring much. - Increasing 70/30 to 65u BID  - SSI resistant  - D/c meal time coverage insulin  #Sepsis: Resolved. Evidenced by leukocytosis, tachycardia, tachypnea and fever, with source of infection being CAP after outpatient treatment failure with Azithromycin. Levaquin started in the ED and continued on admission without improvement of sx. Broad spectrum coverage  initiated on 5/28. SOB overnight and neb treatments changed to Xopenox and Atrovent, which do seem to have helped somewhat. HIV negative. Legionella Ag and Strep Pneumo urinary Ag are both negative; however, he does have respiratory cx positive with normal flora. BCx and expectorated sputum cx NGTD.Transitioned to po abx on 5/30 and he has remained afebrile.  - Augmentin 875-125mg  po q12h, day #5/10 - Follow blood cx (though drawn after abx)  - O2 therapy as needed for O2 Sat > 92%  - Tylenol to manage fever  - Follow up CXR in 6 weeks to ensure resolution of PNA, especially given history of tobacco abuse   #Chest pain: Resolved  Pt reported left sided chest pain present even when not coughing. Pain not reproducible on exam. No EKG changes. Troponins negative x 3. Appears more pleuritic in nature.  #AKI: Cr 1.32 on admission. Possibly prerenal. 1.10 today with IVF (which has been stopped) and improved po intake. - AM BMP  #Tobacco abuse:  Continues to smoke ~10 cigarettes per day.  -Counsel on cessation by MD and RN  -Nicotine 14mg  patch daily   #  DVT PPx:  Lovenox, SCDs     Dispo: Disposition is deferred at this time, awaiting improvement of current medical problems.  Possible d/c to home tomorrow pending blood pressure control.  The patient does have a current PCP Eulas Post, Brett Canales, MD), therefore will be requiring OPC follow-up after discharge.   The patient does not have transportation limitations that hinder transportation to clinic appointments.  .Services Needed at time of discharge: Y = Yes, Blank = No PT:   OT:   RN:   Equipment:   Other:     LOS: 4 days   Otho Bellows 01/28/2013, 9:28 AM

## 2013-01-28 NOTE — Progress Notes (Signed)
RT note: CPAP placed in room for h/s set-up/use.

## 2013-01-28 NOTE — Progress Notes (Signed)
Patient had some V tach alarms. VS taken and EKG done. Dr. Eulas Post aware orders received. Will continue to monitor.

## 2013-01-28 NOTE — Consult Note (Signed)
CARDIOLOGY CONSULT NOTE  Patient ID: Jerry Cantrell  MRN: [id number]  DOB: 07-21-1966  Admit date: 01/24/2013 Date of Consult: 01/28/2013  Primary Physician: Otho Bellows, MD Primary Cardiologist: None Primary Electrophysiologist:  None  Reason for Consultation:  AV block on telemetry  History of Present Illness: Jerry Cantrell is a 47 y.o. male with a history of poorly controlled hypertension and IDDM, who is admitted to the inpatient medicine service for pneumonia.  He was admitted on 5/27 with SOB and Chest Pain, and found to be septic 2/2 CAP.  Currently on day 5/10 of Augmentin.    His chest pain on admission was felt to be pleuritic, and his tropoinins were negative x 3 without ischemic EKG changes.  On admission he was normotensive, which is atypical for him as outpatient blood pressures run in the 160/100 range.     He has been recovering well, but today while sleeping he was noted to have 2 episodes of what looks like 2:1 AV block on telemetry, lasting for 4-5 ventricular beats.  He also had what appears to be a 17 beat run of NSVT, which although was irregular in rate, had a clear change in axis and is wider than his normal QRS complex.  He was asymptomatic throughout both episodes.   He denies orthopnea but does have PND felt to be related to sleep apnea. No current chest pain, SOB at rest.   Denies any prior episodes of lightheadedness or syncope.    Past Medical History  Diagnosis Date  . Diabetes mellitus   . Hypertension   . Hyperlipidemia   . Meralgia paraesthetica 05/03/2012  . Lumbar spondylosis 05/03/2012  . Lumbar spinal stenosis 05/03/2012    Mild with only right L4 nerve root encroachment, no neurogenic claudication   . 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 Urinary incontinence 05/12/2012    Since the start of Sept. 2013   . Hemorrhoids 05/12/2012  . UTI (lower urinary tract infection)   . Meralgia paraesthetica 05/03/2012    On Lyrica which does improve pain.    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 Substance abuse   . Hyperlipidemia   . Pneumonia     Past Surgical History  Procedure Laterality Date  . Tonsillectomy    . Abdominal surgery      Abscess I&D 2/2 infected hair  . Colonoscopy w/ polypectomy      pt to bring records     Home Meds: Prior to Admission medications   Medication Sig Start Date End Date Taking? Authorizing Provider  albuterol (PROVENTIL HFA;VENTOLIN HFA) 108 (90 BASE) MCG/ACT inhaler Inhale 2 puffs into the lungs every 4 (four) hours as needed for wheezing or shortness of breath. 01/13/13  Yes Coralee North, PA-C  amitriptyline (ELAVIL) 50 MG tablet Take 50 mg by mouth at bedtime.   Yes Historical Provider, MD  furosemide (LASIX) 20 MG tablet Take 1 tablet (20 mg total) by mouth daily. 01/17/13 01/17/14 Yes Neta Ehlers, MD  insulin aspart (NOVOLOG) 100 UNIT/ML injection Inject 20 Units into the skin 5 (five) times daily. With meals and snacks   Yes Historical Provider, MD  insulin glargine (LANTUS) 100 UNIT/ML injection Inject 0.4 mLs (40 Units total) into the skin 2 (two) times daily. 01/18/13  Yes Neta Ehlers, MD  lisinopril (PRINIVIL,ZESTRIL) 40 MG tablet Take 1 tablet (40 mg total) by mouth daily. 01/17/13  Yes Neta Ehlers, MD  metoprolol (LOPRESSOR) 100 MG tablet Take 1 tablet (100  mg total) by mouth 2 (two) times daily. 01/17/13  Yes Neta Ehlers, MD  omega-3 acid ethyl esters (LOVAZA) 1 G capsule Take 2 capsules (2 g total) by mouth 2 (two) times daily. 05/12/12  Yes Otho Bellows, MD  pregabalin (LYRICA) 100 MG capsule Take 1 capsule (100 mg total) by mouth 2 (two) times daily. 01/17/13  Yes Neta Ehlers, MD  terazosin (HYTRIN) 1 MG capsule Take 1 mg by mouth at bedtime.   Yes Historical Provider, MD    Current Medications: . amitriptyline  50 mg Oral QHS  . amLODipine  10 mg Oral Daily  . amoxicillin-clavulanate  1 tablet Oral Q12H  . aspirin  81 mg Oral Daily  . atorvastatin  80 mg Oral q1800  .  enoxaparin (LOVENOX) injection  65 mg Subcutaneous Q24H  . hydrochlorothiazide  25 mg Oral Daily  . insulin aspart  0-20 Units Subcutaneous TID WC  . insulin aspart  0-5 Units Subcutaneous QHS  . insulin aspart protamine- aspart  65 Units Subcutaneous BID WC  . lisinopril  40 mg Oral Daily  . nicotine  14 mg Transdermal Daily  . pregabalin  100 mg Oral BID  . terazosin  1 mg Oral QHS     Allergies:   No Known Allergies  Social History:   The patient  reports that he has been smoking Cigarettes.  He has been smoking about 0.70 packs per day. He has never used smokeless tobacco. He reports that he drinks about 7.2 ounces of alcohol per week.    Family History:   The patient's family history includes Alcohol abuse in his father and Cancer in his father.   ROS:  Please see the history of present illness.  All other systems reviewed and negative.   Vital Signs: Blood pressure 169/104, pulse 82, temperature 98.4 F (36.9 C), temperature source Oral, resp. rate 22, height 6\' 1"  (1.854 m), weight 136.3 kg (300 lb 7.8 oz), SpO2 95.00%.   PHYSICAL EXAM: General:  overweight, in no acute distress HEENT: normal Lymph: no adenopathy Neck: JVP ~ 8cm, increases with inspiration.  +HJR Endocrine:  No thryomegaly Vascular: No carotid bruits; Cardiac:  normal S1, S2; RRR; no murmur Lungs:  Bibasilar rales, no egophony noted Abd: soft, nontender, no hepatomegaly Ext: trace bilateral pre-tibial edema Musculoskeletal:  No deformities, BUE and BLE strength normal and equal Skin: warm and dry Neuro:  CNs 2-12 intact, no focal abnormalities noted Psych:  Normal affect   EKG:  Sinus tachycardia with non-specific intraventricular conduction delay, normal PR, LVH.  Labs: No results found for this basename: CKTOTAL, CKMB, TROPONINI,  in the last 72 hours Lab Results  Component Value Date   WBC 7.9 01/28/2013   HGB 13.7 01/28/2013   HCT 41.0 01/28/2013   MCV 91.3 01/28/2013   PLT 192 01/28/2013     Recent Labs Lab 01/24/13 1320  01/28/13 0630  NA 130*  < > 142  K 4.0  < > 3.9  CL 93*  < > 100  CO2 26  < > 31  BUN 20  < > 14  CREATININE 1.32  < > 1.10  CALCIUM 8.6  < > 9.6  PROT 6.3  --   --   BILITOT 0.5  --   --   ALKPHOS 114  --   --   ALT 14  --   --   AST 18  --   --   GLUCOSE 415*  < >  82  < > = values in this interval not displayed. Lab Results  Component Value Date   CHOL 186 05/12/2012   HDL 46 05/12/2012   LDLCALC 116* 05/12/2012   TRIG 120 05/12/2012   Lab Results  Component Value Date   DDIMER  Value: <0.22        AT THE INHOUSE ESTABLISHED CUTOFF VALUE OF 0.48 ug/mL FEU, THIS ASSAY HAS BEEN DOCUMENTED IN THE LITERATURE TO HAVE 03/23/2007    Radiology/Studies:  Dg Chest 1 View  01/12/2013   *RADIOLOGY REPORT*  Clinical Data: Chest pain, shortness of breath.  Question pneumonia.  CHEST - 1 VIEW  Comparison: Portable AP view of the chest earlier.  Findings: Diffuse peribronchial thickening and interstitial prominence are noted in the lungs as seen on frontal view.  Mild cardiomegaly.  No confluent opacities or effusions.  The  IMPRESSION: Bronchitic changes, cardiomegaly.  Stable diffuse interstitial prominence as previous.   Original Report Authenticated By: Rolm Baptise, M.D.   Dg Chest 2 View  01/24/2013   *RADIOLOGY REPORT*  Clinical Data: Chest pain  CHEST - 2 VIEW  Comparison: 01/12/2013  Findings: The cardiac shadow is stable.  There is now a somewhat rounded area of increased density identified in the left lower lobe which was not seen on the prior exam most consistent with a round pneumonia.  No sizable effusion is seen.  No other focal abnormality is noted.  IMPRESSION: Changes consistent with round pneumonia.  Follow-up films following appropriate therapy are recommended.   Original Report Authenticated By: Inez Catalina, M.D.   Dg Chest Port 1 View  01/12/2013   *RADIOLOGY REPORT*  Clinical Data: Chest pain, shortness of breath.  PORTABLE CHEST - 1 VIEW   Comparison: 03/05/2010  Findings: Cardiomegaly.  Diffuse peribronchial thickening. Interstitial prominence could be related to edema or atypical infection.  No effusions.  No acute bony abnormality.  IMPRESSION: Bronchitic changes.  Cardiomegaly.  Diffuse interstitial prominence, question edema versus atypical/viral infection.   Original Report Authenticated By: Rolm Baptise, M.D.    ASSESSMENT AND PLAN:  1. Transient AV block - This occurred while patient was sleeping, snoring, asymptomatic - Likely exacerbated by increased vagal tone during apnea episode - Agree with CPAP while sleeping - Would recommend change to Coreg 12.5 mg BID from metoprolol as has less AV nodal blockade and better blood pressure control.  It is on most $4 dollar lists.  Quickly uptitrate to 25mg  BID if tolerated.  2. NSVT - recommend supplementing mag > 2.0, K > 4.0 - continue to monitor, agree with step-down bed assignment  3. CHF - He has some evidence of CHF on exam with bibasilar rales, slightly elevated JVP, and mild LE edema - Would favor diuresis of ~1L net negative for the next couple of days with lasix.  Hold HCTZ for now. - BNP 600 on 5/15, recheck today. - Repeat CXR (increased vascular markings on my read from admission). - Recommend Echocardiogram.  4. Poorly controlled hypertension - On a good regimen currently, continue amiodarone, lisinopril at maximum doses - Increased diuresis as above - Changing to coreg from metoprolol should help as well.  Cleotis Lema 01/28/2013 6:49 PM

## 2013-01-29 DIAGNOSIS — I10 Essential (primary) hypertension: Secondary | ICD-10-CM

## 2013-01-29 DIAGNOSIS — J189 Pneumonia, unspecified organism: Secondary | ICD-10-CM

## 2013-01-29 DIAGNOSIS — I499 Cardiac arrhythmia, unspecified: Secondary | ICD-10-CM

## 2013-01-29 LAB — BASIC METABOLIC PANEL
BUN: 15 mg/dL (ref 6–23)
Chloride: 95 mEq/L — ABNORMAL LOW (ref 96–112)
GFR calc Af Amer: >90 mL/min (ref >90)
Glucose, Bld: 218 mg/dL — ABNORMAL HIGH (ref 70–99)
Potassium: 3.9 mEq/L (ref 3.5–5.1)
Sodium: 136 mEq/L (ref 135–145)

## 2013-01-29 LAB — CBC
HCT: 42 % (ref 39.0–52.0)
Hemoglobin: 14.1 g/dL (ref 13.0–17.0)
MCH: 30.5 pg (ref 26.0–34.0)
MCHC: 33.6 g/dL (ref 30.0–36.0)
RBC: 4.63 MIL/uL (ref 4.22–5.81)

## 2013-01-29 LAB — GLUCOSE, CAPILLARY
Glucose-Capillary: 320 mg/dL — ABNORMAL HIGH (ref 70–99)
Glucose-Capillary: 337 mg/dL — ABNORMAL HIGH (ref 70–99)
Glucose-Capillary: 142 mg/dL — ABNORMAL HIGH (ref 70–99)
Glucose-Capillary: 259 mg/dL — ABNORMAL HIGH (ref 70–99)

## 2013-01-29 LAB — MRSA PCR SCREENING: MRSA by PCR: NEGATIVE

## 2013-01-29 MED ORDER — INSULIN ASPART 100 UNIT/ML ~~LOC~~ SOLN
10.0000 [IU] | Freq: Once | SUBCUTANEOUS | Status: AC
  Administered 2013-01-29: 10 [IU] via SUBCUTANEOUS

## 2013-01-29 MED ORDER — INSULIN ASPART PROT & ASPART (70-30 MIX) 100 UNIT/ML ~~LOC~~ SUSP
70.0000 [IU] | Freq: Two times a day (BID) | SUBCUTANEOUS | Status: DC
  Administered 2013-01-30: 70 [IU] via SUBCUTANEOUS
  Filled 2013-01-29: qty 10

## 2013-01-29 MED ORDER — PROMETHAZINE HCL 25 MG/ML IJ SOLN
12.5000 mg | Freq: Four times a day (QID) | INTRAMUSCULAR | Status: DC | PRN
  Administered 2013-01-29 (×2): 12.5 mg via INTRAVENOUS
  Filled 2013-01-29 (×3): qty 1

## 2013-01-29 MED ORDER — INSULIN ASPART PROT & ASPART (70-30 MIX) 100 UNIT/ML ~~LOC~~ SUSP
35.0000 [IU] | Freq: Once | SUBCUTANEOUS | Status: AC
  Administered 2013-01-29: 35 [IU] via SUBCUTANEOUS
  Filled 2013-01-29: qty 10

## 2013-01-29 MED ORDER — CLONIDINE HCL 0.1 MG PO TABS
0.1000 mg | ORAL_TABLET | Freq: Two times a day (BID) | ORAL | Status: DC
  Administered 2013-01-29 – 2013-01-30 (×3): 0.1 mg via ORAL
  Filled 2013-01-29 (×4): qty 1

## 2013-01-29 MED ORDER — ALBUTEROL SULFATE HFA 108 (90 BASE) MCG/ACT IN AERS
1.0000 | INHALATION_SPRAY | Freq: Four times a day (QID) | RESPIRATORY_TRACT | Status: DC | PRN
  Filled 2013-01-29: qty 6.7

## 2013-01-29 MED ORDER — HYDRALAZINE HCL 20 MG/ML IJ SOLN
10.0000 mg | Freq: Once | INTRAMUSCULAR | Status: AC
  Administered 2013-01-29: 10 mg via INTRAVENOUS
  Filled 2013-01-29: qty 1

## 2013-01-29 MED ORDER — INSULIN ASPART PROT & ASPART (70-30 MIX) 100 UNIT/ML ~~LOC~~ SUSP
5.0000 [IU] | Freq: Once | SUBCUTANEOUS | Status: AC
  Administered 2013-01-29: 5 [IU] via SUBCUTANEOUS
  Filled 2013-01-29: qty 10

## 2013-01-29 MED ORDER — CARVEDILOL 25 MG PO TABS
25.0000 mg | ORAL_TABLET | Freq: Two times a day (BID) | ORAL | Status: DC
  Administered 2013-01-29 – 2013-01-30 (×2): 25 mg via ORAL
  Filled 2013-01-29 (×4): qty 1

## 2013-01-29 NOTE — Progress Notes (Addendum)
Subjective: Pt with few beats of 2nd degree AV block, non Wenckebach. Cardiology consulted and thought 2/2 OSA; he was placed on a CPAP but did not tolerate it and vomited. Still with headache, improved with Tylenol. Nausea this morning but tolerated a full meal at breakfast.   Objective: Vital signs in last 24 hours: Filed Vitals:   01/29/13 0500 01/29/13 0720 01/29/13 0810 01/29/13 0850  BP:  185/102  187/100  Pulse:  82  81  Temp:   97.7 F (36.5 C)   TempSrc:   Oral   Resp:  16  18  Height:      Weight: 293 lb 14 oz (133.3 kg)     SpO2:  96%  97%   Weight change: -6 lb 9.8 oz (-3 kg)  Intake/Output Summary (Last 24 hours) at 01/29/13 1043 Last data filed at 01/29/13 0852  Gross per 24 hour  Intake    360 ml  Output   1625 ml  Net  -1265 ml   Vitals reviewed. General: Resting in bed, no respiratory distress HEENT: PERRL, EOMI, no scleral icterus Cardiac: RRR, no rubs, murmurs or gallops Pulm: Improved breath sounds in bases bilaterally, no crackles appreciated in LLL, no appreciable wheezes Abd: Obese, disteneded, nontender, BS present Ext: Warm and well perfused Neuro: Alert and oriented X3, cranial nerves II-XII grossly intact, strength intact in BLU&LE  Lab Results: Basic Metabolic Panel:  Recent Labs Lab 01/27/13 0532 01/27/13 1954 01/28/13 0630 01/29/13 0615  NA 137 142 142 136  K 4.0 4.0 3.9 3.9  CL 99 101 100 95*  CO2 30 33* 31 31  GLUCOSE 280* 217* 82 218*  BUN 17 16 14 15   CREATININE 1.29 1.16 1.10 1.03  CALCIUM 9.2 9.1 9.6 9.7  MG  --  1.8  --   --    Liver Function Tests:  Recent Labs Lab 01/24/13 1320  AST 18  ALT 14  ALKPHOS 114  BILITOT 0.5  PROT 6.3  ALBUMIN 2.3*    Recent Labs Lab 01/24/13 1320  LIPASE 21   CBC:  Recent Labs Lab 01/24/13 1321  01/28/13 0630 01/29/13 0615  WBC 13.6*  < > 7.9 7.0  NEUTROABS 11.3*  --   --   --   HGB 14.7  < > 13.7 14.1  HCT 42.8  < > 41.0 42.0  MCV 89.4  < > 91.3 90.7  PLT 123*  < >  192 210  < > = values in this interval not displayed. Cardiac Enzymes:  Recent Labs Lab 01/24/13 1934 01/24/13 2226 01/25/13 0630  CKTOTAL  --  193  --   CKMB  --  1.5  --   TROPONINI <0.30 <0.30 <0.30   CBG:  Recent Labs Lab 01/27/13 2214 01/28/13 0800 01/28/13 1145 01/28/13 1650 01/28/13 2156 01/29/13 0843  GLUCAP 134* 74 232* 303* 311* 320*   Urine Drug Screen: Drugs of Abuse     Component Value Date/Time   LABOPIA NEG 06/02/2012 0945   LABOPIA NONE DETECTED 03/05/2010 0940   COCAINSCRNUR NEG 06/02/2012 0945   COCAINSCRNUR NONE DETECTED 03/05/2010 0940   LABBENZ NEG 06/02/2012 0945   LABBENZ NONE DETECTED 03/05/2010 0940   LABBENZ NEG 02/18/2010 2054   AMPHETMU NONE DETECTED 03/05/2010 0940   AMPHETMU NEG 02/18/2010 2054   THCU NONE DETECTED 03/05/2010 0940   LABBARB NEG 06/02/2012 0945   LABBARB  Value: NONE DETECTED        DRUG SCREEN FOR MEDICAL PURPOSES ONLY.  IF CONFIRMATION IS NEEDED FOR ANY PURPOSE, NOTIFY LAB WITHIN 5 DAYS.        LOWEST DETECTABLE LIMITS FOR URINE DRUG SCREEN Drug Class       Cutoff (ng/mL) Amphetamine      1000 Barbiturate      200 Benzodiazepine   [id number] Tricyclics       [id number] Opiates          300 Cocaine          300 THC              50 03/05/2010 0940    Urinalysis: No results found for this basename: COLORURINE, APPERANCEUR, LABSPEC, PHURINE, GLUCOSEU, HGBUR, BILIRUBINUR, KETONESUR, PROTEINUR, UROBILINOGEN, NITRITE, LEUKOCYTESUR,  in the last 168 hours   Micro Results: Recent Results (from the past 240 hour(s))  CULTURE, RESPIRATORY (NON-EXPECTORATED)     Status: None   Collection Time    01/24/13  5:40 PM      Result Value Range Status   Specimen Description SPUTUM   Final   Special Requests NONE   Final   Gram Stain     Final   Value: RARE WBC PRESENT,BOTH PMN AND MONONUCLEAR     RARE SQUAMOUS EPITHELIAL CELLS PRESENT     MODERATE GRAM POSITIVE COCCI     IN PAIRS   Culture NORMAL OROPHARYNGEAL FLORA   Final   Report Status 01/26/2013 FINAL    Final  CULTURE, BLOOD (ROUTINE X 2)     Status: None   Collection Time    01/24/13  6:44 PM      Result Value Range Status   Specimen Description BLOOD RIGHT FOREARM   Final   Special Requests BOTTLES DRAWN AEROBIC AND ANAEROBIC 10CC   Final   Culture  Setup Time 01/25/2013 05:08   Final   Culture     Final   Value:        BLOOD CULTURE RECEIVED NO GROWTH TO DATE CULTURE WILL BE HELD FOR 5 DAYS BEFORE ISSUING A FINAL NEGATIVE REPORT   Report Status PENDING   Incomplete  CULTURE, BLOOD (ROUTINE X 2)     Status: None   Collection Time    01/24/13  7:34 PM      Result Value Range Status   Specimen Description BLOOD RIGHT HAND   Final   Special Requests BOTTLES DRAWN AEROBIC ONLY [facility]   Final   Culture  Setup Time 01/25/2013 02:32   Final   Culture     Final   Value:        BLOOD CULTURE RECEIVED NO GROWTH TO DATE CULTURE WILL BE HELD FOR 5 DAYS BEFORE ISSUING A FINAL NEGATIVE REPORT   Report Status PENDING   Incomplete  CULTURE, EXPECTORATED SPUTUM-ASSESSMENT     Status: None   Collection Time    01/25/13 11:12 PM      Result Value Range Status   Specimen Description SPUTUM   Final   Special Requests NONE   Final   Sputum evaluation     Final   Value: THIS SPECIMEN IS ACCEPTABLE. RESPIRATORY CULTURE REPORT TO FOLLOW.   Report Status 01/26/2013 FINAL   Final  CULTURE, RESPIRATORY (NON-EXPECTORATED)     Status: None   Collection Time    01/25/13 11:12 PM      Result Value Range Status   Specimen Description SPUTUM   Final   Special Requests NONE   Final   Gram Stain     Final  Value: FEW WBC PRESENT,BOTH PMN AND MONONUCLEAR     RARE SQUAMOUS EPITHELIAL CELLS PRESENT     FEW GRAM POSITIVE COCCI     IN PAIRS RARE GRAM POSITIVE RODS   Culture NORMAL OROPHARYNGEAL FLORA   Final   Report Status 01/28/2013 FINAL   Final  MRSA PCR SCREENING     Status: None   Collection Time    01/29/13  5:54 AM      Result Value Range Status   MRSA by PCR NEGATIVE  NEGATIVE Final   Comment:             The GeneXpert MRSA Assay (FDA     approved for NASAL specimens     only), is one component of a     comprehensive MRSA colonization     surveillance program. It is not     intended to diagnose MRSA     infection nor to guide or     monitor treatment for     MRSA infections.   Studies/Results: Dg Chest 2 View  01/28/2013   *RADIOLOGY REPORT*  Clinical Data: Shortness breath.  Pneumonia.  CHEST - 2 VIEW  Comparison: 01/24/2013  Findings: Decrease in central left lower lobe airspace disease seen since prior study.  Diffuse interstitial prominence is stable.  No evidence of pleural effusion.  Mild cardiomegaly is also stable.  IMPRESSION:  1.  Decreased central left lower lobe airspace disease since prior exam. 2.  Stable cardiomegaly and diffuse interstitial prominence.   Original Report Authenticated By: Earle Gell, M.D.   Medications: I have reviewed the patient's current medications. Scheduled Meds: . amitriptyline  50 mg Oral QHS  . amLODipine  10 mg Oral Daily  . amoxicillin-clavulanate  1 tablet Oral Q12H  . aspirin  81 mg Oral Daily  . atorvastatin  80 mg Oral q1800  . carvedilol  12.5 mg Oral BID WC  . enoxaparin (LOVENOX) injection  65 mg Subcutaneous Q24H  . insulin aspart  0-20 Units Subcutaneous TID WC  . insulin aspart  0-5 Units Subcutaneous QHS  . insulin aspart protamine- aspart  5 Units Subcutaneous Once  . insulin aspart protamine- aspart  70 Units Subcutaneous BID WC  . lisinopril  40 mg Oral Daily  . nicotine  14 mg Transdermal Daily  . pregabalin  100 mg Oral BID  . terazosin  1 mg Oral QHS   Continuous Infusions:  PRN Meds:.acetaminophen, hydrALAZINE, ipratropium, levalbuterol, nitroGLYCERIN, promethazine  Assessment/Plan: 47 yo M with h/o HTN, DM, HLD & peripheral neuropathy presents on 01/24/13 with worsening SOB and chest pain.   #Transient AV block  2nd degree AV block, non Wenckebach occurred while patient was sleeping, snoring, and asymptomatic.  Cardiology was consulted and felt that this was possibly caused by increased vagal tone during apnea episode. CPAP was started but was not tolerated by the patient 2/2 nausea/vomiting. His metoprolol was also changed to Coreg 12.5mg  po BID, as it has better AV nodal blockade and better blood pressure control.  No further episodes ov AV block. Will likely uptitrate to 25mg  BID tomorrow if he continues to tolerate the 12.5mg  BID. - Coreg 12.5mg  po BID  #HTN:  Normotensive on admission, but BP elevated during hospitalization, thought initially from coughing, but BP has remained persistently elevated. Also c/o headache, likely from HTN. Started CCB on 5/29. Started HCTZ 5/31 and stopped Lasix. Changed metoprolol to Coreg per Cardiology recommendations. Also concern for CHF given his elevated diastolic BPs. ECHO is ordered  and is pending. Still requiring PRN Hydralazine intermittently. Cardiology aware; will continue to watch. - Continue Lisinopril 40mg  daily  - Coreg 12.5mg  po BID  - Amlodipine to 10mg  po daily - HCTZ 25mg  po daily - Hydralazine 5-10mg  IV q4h PRN for SBP>180 or DBP>100, hold for HR>95 - 2D ECHO - Renal u/s to evaluate for renal artery stenosis  #DM:  Most recent A1c 7.4 on 01/17/13. Home regimen includes Lantus 40u BID & Novolog 8-10u TID with meals and HS. CBGs elevated this admission, but improving. Changed Lantus to 70/30 on 5/30, as it is more affordable as an outpatient. CBGs are greatly improved. Due to his difficulty affording the Novolog, uptitrating the 70/30 to 65u, meal time coverage was stopped. Still with SSI. CBGs still elevated 200-320.  - Increasing 70/30 to 70u BID  - SSI resistant   #Sepsis: Resolved. Evidenced by leukocytosis, tachycardia, tachypnea and fever, with source of infection being CAP after outpatient treatment failure with Azithromycin. Levaquin started in the ED and continued on admission without improvement of sx. Broad spectrum coverage initiated on  5/28. SOB overnight and neb treatments changed to Xopenox and Atrovent, which do seem to have helped somewhat. HIV negative. Legionella Ag and Strep Pneumo urinary Ag are both negative; however, he does have respiratory cx positive with normal flora. BCx and expectorated sputum cx NGTD.Transitioned to po abx on 5/30 and he has remained afebrile.  - Augmentin 875-125mg  po q12h, day #6/10 - Follow blood cx (though drawn after abx)  - O2 therapy as needed for O2 Sat > 92%  - Tylenol to manage fever  - Follow up CXR in 6 weeks to ensure resolution of PNA, especially given history of tobacco abuse   #Chest pain: Resolved  Pt reported left sided chest pain present even when not coughing. Pain not reproducible on exam. No EKG changes. Troponins negative x 3. Appears more pleuritic in nature.  #AKI: Cr 1.32 on admission. Possibly prerenal. Started on IVF on admission, which has been stopped. With improved po intake, Cr 1.03 today with GFR 85. - AM BMP  #Tobacco abuse:  Continues to smoke ~10 cigarettes per day.  -Counsel on cessation by MD and RN  -Nicotine 14mg  patch daily   #DVT PPx:  Lovenox, SCDs     Dispo: Disposition is deferred at this time, awaiting improvement of current medical problems.    The patient does have a current PCP Eulas Post, Brett Canales, MD), therefore will be requiring OPC follow-up after discharge.   The patient does not have transportation limitations that hinder transportation to clinic appointments.  .Services Needed at time of discharge: Y = Yes, Blank = No PT:   OT:   RN:   Equipment:   Other:     LOS: 5 days   Otho Bellows 01/29/2013, 10:43 AM

## 2013-01-29 NOTE — Progress Notes (Signed)
Dr. Blaine Hamper notified of pt's BP -- states he will adjust meds. Armandina Gemma, Kirby Crigler

## 2013-01-29 NOTE — Progress Notes (Signed)
Dr. Eulas Post notified at 1225 that pt had 3.5 sec pause (went into 2nd degree Mobitz II for several beats immediately prior to pause). Simultaneous with apneic episode (sats mid-80s) -- pt asleep and on c-pap with nasal mask with O2. Plan of care -- switch to full-face c-pap mask, give 10 mg of PRN Hydralazine and notify cardiology (Dr. Acie Fredrickson updated at 1235.) Connye Burkitt

## 2013-01-29 NOTE — Progress Notes (Addendum)
PROGRESS NOTE  Subjective:   Jerry Cantrell is a 47 yo with poorly controlled DM, HTN, admitted 5/27 with pneumonia.  We were consulted for some rhythm problems  transient [place] AV block  and a run of wide complex, irregular  tachycardia ( ? NSVT)   These have resloved.  Objective:    Vital Signs:   Temp:  [97.2 F (36.2 C)-98.5 F (36.9 C)] 97.7 F (36.5 C) (06/01 0810) Pulse Rate:  [81-92] 81 (06/01 0850) Resp:  [16-24] 18 (06/01 0850) BP: (147-209)/(90-110) 187/100 mmHg (06/01 0850) SpO2:  [92 %-98 %] 97 % (06/01 0850) Weight:  [293 lb 14 oz (133.3 kg)] 293 lb 14 oz (133.3 kg) (06/01 0500)  Last BM Date: 01/29/13   24-hour weight change: Weight change: -6 lb 9.8 oz (-3 kg)  Weight trends: Filed Weights   01/28/13 0646 01/29/13 0317 01/29/13 0500  Weight: 300 lb 7.8 oz (136.3 kg) 293 lb 14 oz (133.3 kg) 293 lb 14 oz (133.3 kg)    Intake/Output:  05/31 0701 - 06/01 0700 In: 120 [P.O.:120] Out: 850 [Urine:850] Total I/O In: 240 [P.O.:240] Out: 775 [Urine:775]   Physical Exam: BP 187/100  Pulse 81  Temp(Src) 97.7 F (36.5 C) (Oral)  Resp 18  Ht 6\' 1"  (1.854 m)  Wt 293 lb 14 oz (133.3 kg)  BMI 38.78 kg/m2  SpO2 97%  General: Vital signs reviewed and noted. Multiple tatoos  Head: Normocephalic, atraumatic.  Eyes: conjunctivae/corneas clear.  EOM's intact.   Throat: normal  Neck:  thick  Lungs:  Clear,   Heart:  RR, normal S1, S2  Abdomen:  Soft, non-tender, non-distended    Extremities: No edema   Neurologic: A&O X3, CN II - XII are grossly intact.   Psych: Normal   Skin:  Multiple sebacious cysts on his back , multiple tatoos.  Labs: BMET:  Recent Labs  01/27/13 0532 01/27/13 1954 01/28/13 0630 01/29/13 0615  NA 137 142 142 136  K 4.0 4.0 3.9 3.9  CL 99 101 100 95*  CO2 30 33* 31 31  GLUCOSE 280* 217* 82 218*  BUN 17 16 14 15   CREATININE 1.29 1.16 1.10 1.03  CALCIUM 9.2 9.1 9.6 9.7  MG  --  1.8  --   --     Liver function tests: No  results found for this basename: AST, ALT, ALKPHOS, BILITOT, PROT, ALBUMIN,  in the last 72 hours No results found for this basename: LIPASE, AMYLASE,  in the last 72 hours  CBC:  Recent Labs  01/28/13 0630 01/29/13 0615  WBC 7.9 7.0  HGB 13.7 14.1  HCT 41.0 42.0  MCV 91.3 90.7  PLT 192 210    Cardiac Enzymes: No results found for this basename: CKTOTAL, CKMB, TROPONINI,  in the last 72 hours  Coagulation Studies: No results found for this basename: LABPROT, INR,  in the last 72 hours   Other results:  NSR with no significant arrhythmias for the past several days.  He has had transient AV block ( most of this looks like [place], 5/30 ) and a run of irregular wide complex tachycardia 5/31  Medications:    Infusions:    Scheduled Medications: . amitriptyline  50 mg Oral QHS  . amLODipine  10 mg Oral Daily  . amoxicillin-clavulanate  1 tablet Oral Q12H  . aspirin  81 mg Oral Daily  . atorvastatin  80 mg Oral q1800  . carvedilol  12.5 mg Oral BID WC  . enoxaparin (LOVENOX)  injection  65 mg Subcutaneous Q24H  . insulin aspart  0-20 Units Subcutaneous TID WC  . insulin aspart  0-5 Units Subcutaneous QHS  . insulin aspart protamine- aspart  65 Units Subcutaneous BID WC  . lisinopril  40 mg Oral Daily  . nicotine  14 mg Transdermal Daily  . pregabalin  100 mg Oral BID  . terazosin  1 mg Oral QHS    Assessment/ Plan:      Sepsis: plans per medicine team    Type 2 diabetes, controlled, with retinopathy  Arrhythmias: he had transient [place] AV block and a wide complex tachycardia .   these occurred in the setting of pneumonia and significant HTN.  Echo has been ordered.  Will get it read today.     At this point, I would continue to observe.  He needs to take better care of himself.      Hypertension:  His BP is gradually improving.  I suspect that he would benefit from a daily diuretic (diastolic BP is pretty high).  Echo pending    Tobacco abuse     Diabetic peripheral neuropathy associated with type 2 diabetes mellitus    HCAP (healthcare-associated pneumonia)   Disposition:  Length of Stay: 5  Thayer Headings, Brooke Bonito., MD, [facility] 01/29/2013, 9:17 AM Office [phone] Pager [phone]

## 2013-01-29 NOTE — Progress Notes (Signed)
Md notified of pt HA and Nausea new orders given will continue to monitor

## 2013-01-30 ENCOUNTER — Encounter: Payer: Self-pay | Admitting: Internal Medicine

## 2013-01-30 DIAGNOSIS — R0602 Shortness of breath: Secondary | ICD-10-CM

## 2013-01-30 DIAGNOSIS — R079 Chest pain, unspecified: Secondary | ICD-10-CM

## 2013-01-30 DIAGNOSIS — I517 Cardiomegaly: Secondary | ICD-10-CM

## 2013-01-30 LAB — GLUCOSE, RANDOM: Glucose, Bld: 497 mg/dL — ABNORMAL HIGH (ref 70–99)

## 2013-01-30 LAB — BASIC METABOLIC PANEL
Calcium: 9.6 mg/dL (ref 8.4–10.5)
GFR calc Af Amer: >90 mL/min (ref >90)
GFR calc non Af Amer: 87 mL/min — ABNORMAL LOW (ref >90)
Glucose, Bld: 175 mg/dL — ABNORMAL HIGH (ref 70–99)
Potassium: 3.8 mEq/L (ref 3.5–5.1)
Sodium: 136 mEq/L (ref 135–145)

## 2013-01-30 LAB — GLUCOSE, CAPILLARY
Glucose-Capillary: 260 mg/dL — ABNORMAL HIGH (ref 70–99)
Glucose-Capillary: 517 mg/dL — ABNORMAL HIGH (ref 70–99)

## 2013-01-30 LAB — CBC
Hemoglobin: 13.8 g/dL (ref 13.0–17.0)
MCH: 29.6 pg (ref 26.0–34.0)
MCHC: 32 g/dL (ref 30.0–36.0)
Platelets: 234 10*3/uL (ref 150–400)
RBC: 4.66 MIL/uL (ref 4.22–5.81)

## 2013-01-30 MED ORDER — CLONIDINE HCL 0.1 MG PO TABS: 0.1000 mg | ORAL_TABLET | Freq: Two times a day (BID) | ORAL | Status: DC

## 2013-01-30 MED ORDER — INSULIN ASPART PROT & ASPART (70-30 MIX) 100 UNIT/ML ~~LOC~~ SUSP: 70.0000 [IU] | Freq: Two times a day (BID) | SUBCUTANEOUS | Status: DC

## 2013-01-30 MED ORDER — ASPIRIN 81 MG PO CHEW: 81.0000 mg | CHEWABLE_TABLET | Freq: Every day | ORAL | Status: DC

## 2013-01-30 MED ORDER — AMLODIPINE BESYLATE 10 MG PO TABS: 10.0000 mg | ORAL_TABLET | Freq: Every day | ORAL | Status: DC

## 2013-01-30 MED ORDER — PRAVASTATIN SODIUM 40 MG PO TABS: 40.0000 mg | ORAL_TABLET | Freq: Every day | ORAL | Status: DC

## 2013-01-30 MED ORDER — INSULIN ASPART 100 UNIT/ML ~~LOC~~ SOLN: SUBCUTANEOUS | Status: DC

## 2013-01-30 MED ORDER — CARVEDILOL 25 MG PO TABS: 25.0000 mg | ORAL_TABLET | Freq: Two times a day (BID) | ORAL | Status: DC

## 2013-01-30 MED ORDER — AMOXICILLIN-POT CLAVULANATE 875-125 MG PO TABS: 1.0000 | ORAL_TABLET | Freq: Two times a day (BID) | ORAL | Status: DC

## 2013-01-30 NOTE — Progress Notes (Signed)
*  PRELIMINARY RESULTS* Echocardiogram 2D Echocardiogram has been performed.  Leavy Cella 01/30/2013, 9:31 AM

## 2013-01-30 NOTE — Progress Notes (Signed)
Patient ID: Jerry Cantrell, male   DOB: March 24, 1966, 47 y.o.   MRN: [id number] Internal Medicine Attending  Date: 01/30/2013  Patient name: Jerry Cantrell Medical record number: [id number] Date of birth: 09-15-1965 Age: 47 y.o. Gender: male  I saw and evaluated the patient and discussed his care on A.M. rounds with house staff. He has no acute complaints today, with no chest pain or shortness of breath..  He had difficulty tolerating CPAP last night, and reports that he wore it for about an hour and a half.  Exam is notable for few bibasilar crackles; regular rhythm, no extra sounds or murmurs; soft nontender abdomen; no edema.  Labs today are notable or potassium 3.8, creatinine 1.01, WBC 9.5, hemoglobin 13.8.  Plans include complete the course of oral antibiotic for pneumonia; empiric CPAP pending outpatient sleep study (patient is willing to use CPAP and understands the importance); follow blood pressure and adjust regimen as indicated; follow blood sugars and adjust regimen as indicated; workup arrhythmia with 2D echocardiogram as per cardiology; will need to find out from cardiology how long patient should be monitored as inpatient.

## 2013-01-30 NOTE — Progress Notes (Signed)
Ambulate in halls sats remained 94% on room air. Suezanne Cheshire

## 2013-01-30 NOTE — Progress Notes (Signed)
Paged dr Eulas Post re [phone] [phone], notified md of insulin coverage held last nite due to being npo for procedure this am and had late breakfast. Order to give only the 20units of novolog sq. Suezanne Cheshire

## 2013-01-30 NOTE — Progress Notes (Signed)
Discharge instructions, f/u appt., prescriptions discussed and explained to patient. Patient discharge home with wife with belongings. Suezanne Cheshire

## 2013-01-30 NOTE — Discharge Summary (Signed)
[facility] Discharge Note  Name: Jerry Cantrell MRN: [id number] DOB: 1966-05-24 47 y.o.  Date of Admission: 01/24/2013 12:54 PM Date of Discharge: 01/30/2013 Attending Physician: Axel Filler, MD  Discharge Diagnosis: 1. 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 HTN, uncontrolled  3. Diabetes Mellitus associated with neuropathy 4. Sepsis secondary to community acquired pneumonia (CAP), resolved.  5.Chest pain, resolved  6. Acute renal failure  7. Suspected obstructive sleep apnea (OSA) 8. Tobacco abuse  9. Benign prostatic hypertrophy (BPH)  Discharge Medications:   Medication List    STOP taking these medications       furosemide 20 MG tablet  Commonly known as:  LASIX     insulin glargine 100 UNIT/ML injection  Commonly known as:  LANTUS     metoprolol 100 MG tablet  Commonly known as:  LOPRESSOR      TAKE these medications       albuterol 108 (90 BASE) MCG/ACT inhaler  Commonly known as:  PROVENTIL HFA;VENTOLIN HFA  Inhale 2 puffs into the lungs every 4 (four) hours as needed for wheezing or shortness of breath.     amitriptyline 50 MG tablet  Commonly known as:  ELAVIL  Take 50 mg by mouth at bedtime.     amLODipine 10 MG tablet  Commonly known as:  NORVASC  Take 1 tablet (10 mg total) by mouth daily.     amoxicillin-clavulanate 875-125 MG per tablet  Commonly known as:  AUGMENTIN  Take 1 tablet by mouth every 12 (twelve) hours.     aspirin 81 MG chewable tablet  Chew 1 tablet (81 mg total) by mouth daily.     carvedilol 25 MG tablet  Commonly known as:  COREG  Take 1 tablet (25 mg total) by mouth 2 (two) times daily with a meal.     cloNIDine 0.1 MG tablet  Commonly known as:  CATAPRES  Take 1 tablet (0.1 mg total) by mouth 2 (two) times daily.     insulin aspart 100 UNIT/ML injection  Commonly known as:  novoLOG  If sugar is 250 or  higher prior to meal give 10 units. If <250 do not give.     insulin aspart protamine- aspart (70-30) 100 UNIT/ML injection  Commonly known as:  NOVOLOG 70/30  Inject 0.7 mLs (70 Units total) into the skin 2 (two) times daily with a meal.     lisinopril 40 MG tablet  Commonly known as:  PRINIVIL,ZESTRIL  Take 1 tablet (40 mg total) by mouth daily.     omega-3 acid ethyl esters 1 G capsule  Commonly known as:  LOVAZA  Take 2 capsules (2 g total) by mouth 2 (two) times daily.     pravastatin 40 MG tablet  Commonly known as:  PRAVACHOL  Take 1 tablet (40 mg total) by mouth daily.     pregabalin 100 MG capsule  Commonly known as:  LYRICA  Take 1 capsule (100 mg total) by mouth 2 (two) times daily.     terazosin 1 MG capsule  Commonly known as:  HYTRIN  Take 1 mg by mouth at bedtime.        Disposition and follow-up:   [name] was discharged from [facility] in stable condition.  At the hospital follow up visit please address: 1) Medication affordability see the website [email or web address] for medications  2) BMET 3) Make sure patient follows up on sleep  study(and it is scheduled) and using CPAP 4) Titrate up blood pressure medications outpatient 5) Repeat CXR 6 weeks from discharge for resolution of pneumonia  6) will need outpatient pulmonary function tests  Follow-up Appointments: Follow-up Information   Follow up with [facility]. (Sleep Study scheduled for June 9th. - )    Contact information:   [address] [place] [address] [phone]     Discharge Orders   Future Appointments Provider Department Dept Phone   02/02/2013 3:45 PM Otho Bellows, MD [place] [phone]   02/06/2013 8:00 PM [facility] [place]   Future Orders Complete By Expires     Diet - low sodium heart healthy  As directed     Discharge instructions  As directed      Comments:      [facility] follow [facility] with [facility] 02/02/13 at 3:45 PM [id number]  Please review all information and take medications as prescribed  Please bring all medications to your clinic appoinment  Please wear cpap machine every night and when asleep       Consultations: Treatment Team:  [facility], MD-Dr. Kennith Center  Procedures Performed:  Dg Chest 1 View  01/12/2013   *RADIOLOGY REPORT*  Clinical Data: Chest pain, shortness of breath.  Question pneumonia.  CHEST - 1 VIEW  Comparison: Portable AP view of the chest earlier.  Findings: Diffuse peribronchial thickening and interstitial prominence are noted in the lungs as seen on frontal view.  Mild cardiomegaly.  No confluent opacities or effusions.  The  IMPRESSION: Bronchitic changes, cardiomegaly.  Stable diffuse interstitial prominence as previous.   Original Report Authenticated By: Rolm Baptise, M.D.   Dg Chest 2 View  01/28/2013   *RADIOLOGY REPORT*  Clinical Data: Shortness breath.  Pneumonia.  CHEST - 2 VIEW  Comparison: 01/24/2013  Findings: Decrease in central left lower lobe airspace disease seen since prior study.  Diffuse interstitial prominence is stable.  No evidence of pleural effusion.  Mild cardiomegaly is also stable.  IMPRESSION:  1.  Decreased central left lower lobe airspace disease since prior exam. 2.  Stable cardiomegaly and diffuse interstitial prominence.   Original Report Authenticated By: Earle Gell, M.D.   Dg Chest 2 View  01/24/2013   *RADIOLOGY REPORT*  Clinical Data: Chest pain  CHEST - 2 VIEW  Comparison: 01/12/2013  Findings: The cardiac shadow is stable.  There is now a somewhat rounded area of increased density identified in the left lower lobe which was not seen on the prior exam most consistent with a round pneumonia.  No sizable effusion is seen.  No other focal abnormality is noted.  IMPRESSION: Changes consistent with round pneumonia.  Follow-up films following appropriate therapy  are recommended.   Original Report Authenticated By: Inez Catalina, M.D.   Dg Chest Port 1 View  01/12/2013   *RADIOLOGY REPORT*  Clinical Data: Chest pain, shortness of breath.  PORTABLE CHEST - 1 VIEW  Comparison: 03/05/2010  Findings: Cardiomegaly.  Diffuse peribronchial thickening. Interstitial prominence could be related to edema or atypical infection.  No effusions.  No acute bony abnormality.  IMPRESSION: Bronchitic changes.  Cardiomegaly.  Diffuse interstitial prominence, question edema versus atypical/viral infection.   Original Report Authenticated By: Rolm Baptise, M.D.   Renal Artery Duplex 01/30/13   Summary: - No evidence of renal artery stenosis noted bilaterally. - Bilateral abnormal intrarenal resistive indices. Etiology unknown.  2D Echo:  01/30/13 Study Conclusions - Left ventricle: The cavity size was mildly dilated. Wall thickness was increased in a pattern of moderate LVH. Systolic function was normal. The estimated ejection fraction was in the range of 55% to 60%. Wall motion was normal; there were no regional wall motion abnormalities. Left ventricular diastolic function parameters were normal. - Left atrium: The atrium was mildly dilated   Cardiac Cath: none  Admission HPI:  Chief Complaint: Chest pain/Shortness of breath  History of Present Illness:  Mr. Ulery is a 47 yo M with history of DM & HTN who presented to the ED on 01/24/13 with complaints of chest pain with cough, sputum production with hempotysis & post tussive emesis, shortness of breath (worse with exertion), generalized malaise, fatigue & fever/chills for two weeks. Of note, he was seen in the ED on 01/12/13 for the same, diagnosed with PNA and treated with azithromax, which he completed, and reportedly felt somewhat improved, but then started to deteriorate 4 days prior to admission. He was seen for follow up in clinic on 01/18/13 and an echo was ordered to further evaluate his dyspnea on exertion, and  lasix was started, though he has not started this medication yet. He denies sore throat & congestion, and has not had any sick contacts. He has had 3 episodes of PNA in the past and he continues to smoke.   Review of Systems:  General: no changes in weight, +decrease in appetite  Skin: no rash  HEENT: no blurry vision, hearing changes, sore throat  Pulm: per HPI  CV: no palpitations  Abd: no abdominal pain, diarrhea/constipation  GU: no dysuria, hematuria, polyuria  Neuro: no weakness, numbness, or tingling  Physical Exam:  Blood pressure 145/71, pulse 110, temperature 101 F (38.3 C), temperature source Oral, resp. rate 29, height 6\' 1"  (1.854 m), weight 302 lb (136.986 kg), SpO2 96.00%.  General: Alert, ill appearing, morbidly obese male, and cooperative on examination.  Head: Normocephalic and atraumatic.  Eyes: Pupils equal, round, and reactive to light, no injection and anicteric.  Mouth: Pharynx pink and moist, no erythema or exudates.  Neck: Supple, full ROM Lungs: Increased work of breathing, diffuse crackles, no wheezing aprreciated Heart: Tachycardic, regular rhythm, no murmur, no gallop, and no rub.  Abdomen: Soft, non-tender, distended, normal bowel sounds, no guarding, no rebound tenderness, no organomegaly.  Msk: No joint swelling, warmth, or erythema.  Extremities: 2+ radial and DP pulses bilaterally. No cyanosis, clubbing, trace pitting edema Neurologic: Alert & oriented X3, cranial nerves II-XII intact, strength normal in all extremities Skin: Turgor normal and no rashes.  Psych: Normal mood and affect. Memory intact for recent and remote, normally interactive, good eye contact, not anxious appearing, and not depressed appearing.   Hospital Course by problem list: 1. 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 HTN, uncontrolled  3. Diabetes Mellitus associated with neuropathy 4. Sepsis  secondary to community acquired pneumonia (CAP), resolved.  5.Chest pain, resolved  6. Acute renal failure  7. Suspected obstructive sleep apnea (OSA) 8. Tobacco abuse  9. Benign prostatic hypertrophy (BPH)  47 year old male with history of uncontrolled hypertension, uncontrolled diabetes, dyslipidemia & peripheral neuropathy presented on 01/24/13 with worsening shortness of breath and chest pain.   1. 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  He also had a 3.5 second pause before going into second degree Mobitz this admission with a simultaneous apneic episode. Cardiology was consulted and felt that this was possibly caused by increased vagal tone during apnea episodes, also in setting of pneumonia and significant hypertension. He had an echo this admission with normal ejection fraction.  CPAP was started this admission and patient intermittently used due to inability to tolerate.  His Coreg 12.5mg  was increased to 25 mg po BID, Lipitor 40 mg, norvasc 5 mg increased to 10.  He will have an outpatient sleep study.   2. HTN, uncontrolled  Significantly uncontrolled >200/>100 though patient says baseline in high 170s-200s/80s-110s normally.  By discharge somewhat improved but still elevated.  His Coreg 12.5 mg was increased to 25 mg po BID, Lipitor 40 mg, norvasc 5 mg increased to 10 mg, prn Hydralazine 5-10 mg this admission intraveneously, Clonidine 0.1 mg bid.  He was given Lopressor 100 mg bid this admission for approximately 4 days which was discontinued.  Hydrochlorthiazide 25 mg was given and discontinued this admission.  He was given intermittent doses of scheduled Hydralazine 5-10 mg this admission.  He was also given as needed Lopressor 5 mg every 6 hours this admission but was having cardiac pauses so this  medication was discontinued and changed to Hydralazine 5-10 mg every 4 hours as needed.  He was given Lasix 20 mg daily this admission for 3 days then 80 mg Lasix x 1 day.  2D ECHO with EF 55-60%.  Renal arterial duplex negative.  3. Diabetes Mellitus associated with neuropathy HA1c 7.4 % on 01/17/13. He has been hyperglycemic this admission and he says he is hyperglycemia to the 200s at home.  His home regimen includes Lantus 40 units BID & Novolog 8-10u TID with meals and HS. Patient was having trouble affording Lantus which was changed to 70/30 on 5/30 which was titrated up this admission 50 to 70 units bid, as it is more affordable as an outpatient. Current dose 70 units BID (given at discharge).  He was also on sliding scale insulin resistant scale and insulin with meals.    4. Sepsis secondary to community acquired pneumonia (CAP), resolved.  Likely due to community acquired pneumonia with treatment failure with Azithromycin outpatient diagnosed prior to . Levaquin 750 mg x 1 intravenously was started in the Emergency Room.  Then antibiotics were changed to Zosyn and Vancomycin x 2 days.  His HIV was negative, Legionella Antigen and Streptococcus Pneumonia urinary Antigen were both negative, blood cultures Negative, sputum culture with normal flora.  He was transitioned to oral antibiotics (Augmentin 875-125mg  po every 12 hours x 3.5 days) on 01/27/13 and he has remained afebrile. He was discharged with Augmentin 875-125mg  po every 12 hours for 3 more days.  He was ambulated prior to discharge and did not require home Oxygen at discharge.  Follow up chest xray in 6 weeks to ensure resolution of pneumonia.  Risk factor for pneumonia includes history of tobacco abuse.   5.Chest pain, resolved  On admission patient reported left sided chest pain present even when not coughing. Pain was not reproducible on exam. No EKG changes. Troponins negative x 3.   6. Acute renal failure  Creatinine was 1.32 on  admission and trended up to 1.45 and down to 1.01 prior to discharge.  The etiology was possibly prerenal. He was started on normal saline 50 cc/hr initially this admission which was stopped. With improved oral intake, Creatinine trended  down.   7. Suspected obstructive sleep apnea (OSA) Tried patient on cpap this admission with setting titrated up to 12.  He was not tolerating cpap initially and was still desaturating. He has outpatient sleep study scheduled 02/06/13 and was sent home with cpap until further settings can be determined.    8. Tobacco abuse  Educated cessation and he was give a Nicotine 14 mg patch this admission.    9. Benign prostatic hypertrophy (BPH) He was continued on Terazosin 1 mg at night this admission.   DVT Prophylaxis with Lovenox, sequential compression devices.   Discharge Vitals:  BP 141/79  Pulse 85  Temp(Src) 98.3 F (36.8 C) (Oral)  Resp 17  Ht 6\' 1"  (1.854 m)  Wt 294 lb 1.5 oz (133.4 kg)  BMI 38.81 kg/m2  SpO2 93%  Discharge physical exam: General: resting in bed, NAD  HEENT: [place]/at, no scleral icterus  Cardiac: RRR, no rubs, murmurs or gallops  Pulm: clear to auscultation bilaterally, no wheezes, rales, or rhonchi  Abd: soft, nontender, nondistended, BS present, obese  Ext: warm and well perfused, no pedal edema  Neuro: alert and oriented X3, grossly neurologically intact, moving all 4 extremities  Skin: multiple tattoos  Discharge Labs:  Results for GENT, RACK (MRN [id number]) as of 02/01/2013 08:41  Ref. Range 01/28/2013 06:30 01/28/2013 20:42 01/29/2013 06:15 01/30/2013 05:05 01/30/2013 12:35  Sodium Latest Range: 135-145 mEq/L 142  136 136   Potassium Latest Range: 3.5-5.1 mEq/L 3.9  3.9 3.8   Chloride Latest Range: 96-112 mEq/L 100  95 (L) 96   CO2 Latest Range: 19-32 mEq/L 31  31 32   BUN Latest Range: 6-23 mg/dL 14  15 15    Creatinine Latest Range: 0.50-1.35 mg/dL 1.10  1.03 1.01   Calcium Latest Range: 8.4-10.5 mg/dL 9.6  9.7 9.6   GFR calc  non Af Amer Latest Range: >90 mL/min 78 (L)  85 (L) 87 (L)   GFR calc Af Amer Latest Range: >90 mL/min >90  >90 >90   Glucose Latest Range: 70-99 mg/dL 82  218 (H) 175 (H) 497 (H)  Pro B Natriuretic peptide (BNP) Latest Range: 0-125 pg/mL  453.4 (H)      Results for ZAKAR, KANAS (MRN [id number]) as of 02/01/2013 08:41  Ref. Range 01/25/2013 06:30 01/26/2013 05:00 01/27/2013 05:32 01/27/2013 19:54 01/28/2013 06:30  Sodium Latest Range: 135-145 mEq/L  135 137 142 142  Potassium Latest Range: 3.5-5.1 mEq/L  3.8 4.0 4.0 3.9  Chloride Latest Range: 96-112 mEq/L  97 99 101 100  CO2 Latest Range: 19-32 mEq/L  25 30 33 (H) 31  BUN Latest Range: 6-23 mg/dL  18 17 16 14   Creatinine Latest Range: 0.50-1.35 mg/dL  1.33 1.29 1.16 1.10  Calcium Latest Range: 8.4-10.5 mg/dL  8.4 9.2 9.1 9.6  GFR calc non Af Amer Latest Range: >90 mL/min  62 (L) 65 (L) 73 (L) 78 (L)  GFR calc Af Amer Latest Range: >90 mL/min  72 (L) 75 (L) 85 (L) >90  Glucose Latest Range: 70-99 mg/dL  318 (H) 280 (H) 217 (H) 82  Magnesium Latest Range: 1.5-2.5 mg/dL    1.8   Troponin I Latest Range: <0.30 ng/mL <0.30       Results for KELIN, WIKLUND (MRN [id number]) as of 02/01/2013 08:41  Ref. Range 01/24/2013 13:42 01/24/2013 13:44 01/24/2013 19:34 01/24/2013 22:26 01/25/2013 06:25  Sodium Latest Range: 135-145 mEq/L    131 (L) 134 (L)  Potassium Latest Range: 3.5-5.1 mEq/L    4.1  4.0  Chloride Latest Range: 96-112 mEq/L    94 (L) 98  CO2 Latest Range: 19-32 mEq/L    26 24  BUN Latest Range: 6-23 mg/dL    26 (H) 22  Creatinine Latest Range: 0.50-1.35 mg/dL    1.45 (H) 1.32  Calcium Latest Range: 8.4-10.5 mg/dL    8.4 8.2 (L)  GFR calc non Af Amer Latest Range: >90 mL/min    56 (L) 63 (L)  GFR calc Af Amer Latest Range: >90 mL/min    65 (L) 73 (L)  Glucose Latest Range: 70-99 mg/dL    465 (H) 367 (H)  CK, MB Latest Range: 0.3-4.0 ng/mL    1.5   CK Total Latest Range: 7-232 U/L    193   Troponin I Latest Range: <0.30 ng/mL   <0.30 <0.30    Troponin i, poc Latest Range: 0.00-0.08 ng/mL 0.02      Lactic Acid, Venous Latest Range: 0.5-2.2 mmol/L  1.39      Results for ARGUS, VANDECAR (MRN [id number]) as of 02/01/2013 08:41  Ref. Range 01/24/2013 13:20  Sodium Latest Range: 135-145 mEq/L 130 (L)  Potassium Latest Range: 3.5-5.1 mEq/L 4.0  Chloride Latest Range: 96-112 mEq/L 93 (L)  CO2 Latest Range: 19-32 mEq/L 26  BUN Latest Range: 6-23 mg/dL 20  Creatinine Latest Range: 0.50-1.35 mg/dL 1.32  Calcium Latest Range: 8.4-10.5 mg/dL 8.6  GFR calc non Af Amer Latest Range: >90 mL/min 63 (L)  GFR calc Af Amer Latest Range: >90 mL/min 73 (L)  Glucose Latest Range: 70-99 mg/dL 415 (H)  Alkaline Phosphatase Latest Range: 39-117 U/L 114  Albumin Latest Range: 3.5-5.2 g/dL 2.3 (L)  Lipase Latest Range: 11-59 U/L 21  AST Latest Range: 0-37 U/L 18  ALT Latest Range: 0-53 U/L 14  Total Protein Latest Range: 6.0-8.3 g/dL 6.3  Total Bilirubin Latest Range: 0.3-1.2 mg/dL 0.5   Results for TORRYN, RIENTS (MRN [id number]) as of 02/01/2013 08:41  Ref. Range 01/24/2013 13:21 01/25/2013 06:25 01/26/2013 05:00 01/27/2013 05:32 01/28/2013 06:30 01/29/2013 06:15 01/30/2013 05:05  WBC Latest Range: 4.0-10.5 K/uL 13.6 (H) 14.7 (H) 12.7 (H) 8.8 7.9 7.0 9.5  RBC Latest Range: 4.22-5.81 MIL/uL 4.79 4.42 4.27 3.97 (L) 4.49 4.63 4.66  Hemoglobin Latest Range: 13.0-17.0 g/dL 14.7 13.4 12.8 (L) 11.9 (L) 13.7 14.1 13.8  HCT Latest Range: 39.0-52.0 % 42.8 40.1 39.9 36.8 (L) 41.0 42.0 43.1  MCV Latest Range: 78.0-100.0 fL 89.4 90.7 93.4 92.7 91.3 90.7 92.5  MCH Latest Range: 26.0-34.0 pg 30.7 30.3 30.0 30.0 30.5 30.5 29.6  MCHC Latest Range: 30.0-36.0 g/dL 34.3 33.4 32.1 32.3 33.4 33.6 32.0  RDW Latest Range: 11.5-15.5 % 13.2 13.4 13.7 13.5 13.4 13.4 13.5  Platelets Latest Range: 150-400 K/uL 123 (L) 122 (L) 124 (L) 139 (L) 192 210 234  Neutrophils Relative % Latest Range: 43-77 % 86 (H)        Lymphocytes Relative Latest Range: 12-46 % 8 (L)        Monocytes Relative  Latest Range: 3-12 % 6        Eosinophils Relative Latest Range: 0-5 % 0        Basophils Relative Latest Range: 0-1 % 0        NEUT# Latest Range: 1.7-7.7 K/uL 11.3 (H)        Lymphocytes Absolute Latest Range: 0.7-4.0 K/uL 1.0        Monocytes Absolute Latest Range: 0.1-1.0 K/uL 0.9        Eosinophils Absolute Latest Range: 0.0-0.7  K/uL 0.0        Basophils Absolute Latest Range: 0.0-0.1 K/uL 0.0         Results for DALIN, AAGARD (MRN [id number]) as of 02/01/2013 08:41  Ref. Range 01/17/2013 08:48  Hemoglobin A1C No range found 7.4   Results for LAWSON, HOGREFE (MRN [id number]) as of 02/01/2013 08:41  Ref. Range 01/24/2013 19:34  HIV Latest Range: NON REACTIVE  NON REACTIVE   Results for ARGEL, CARTEE (MRN [id number]) as of 02/01/2013 08:41  Ref. Range 01/24/2013 19:49  Legionella Antigen, Urine No range found Negative for Legionella pneumophilia serogroup 1  Strep Pneumo Urinary Antigen Latest Range: NEGATIVE  NEGATIVE   Results for SATYAM, CIZEK (MRN [id number]) as of 02/01/2013 08:41  Ref. Range 01/29/2013 05:54  MRSA by PCR Latest Range: NEGATIVE  NEGATIVE   Respiratory culture normal flora  Blood culture no growth    Signed: Johngabriel, [place] 02/01/2013, 9:07 PM   Time Spent on Discharge: >60 minutes  Services Ordered on Discharge: none Equipment Ordered on Discharge: CPAP for home use.

## 2013-01-30 NOTE — Progress Notes (Signed)
PROGRESS NOTE  Subjective:   Jerry Cantrell is a 47 yo with poorly controlled DM, HTN, admitted 5/27 with pneumonia.  We were consulted for some rhythm problems  transient Wenchebach AV block  and a run of wide complex, irregular  tachycardia ( ? NSVT)   His episodes of bradycardia ( including a 3.5 sec. Pause yesterday) are related to apneic episodes and hypoxemia. He did not tolerate his CPAP last night.  Objective:    Vital Signs:   Temp:  [97.6 F (36.4 C)-98.6 F (37 C)] 98.6 F (37 C) (06/02 0717) Pulse Rate:  [75-94] 87 (06/02 0717) Resp:  [12-22] 22 (06/02 0717) BP: (148-187)/(77-101) 178/91 mmHg (06/02 0717) SpO2:  [91 %-97 %] 91 % (06/02 0717) Weight:  [294 lb 1.5 oz (133.4 kg)] 294 lb 1.5 oz (133.4 kg) (06/02 0500)  Last BM Date: 01/29/13   24-hour weight change: Weight change: 3.5 oz (0.1 kg)  Weight trends: Filed Weights   01/29/13 0317 01/29/13 0500 01/30/13 0500  Weight: 293 lb 14 oz (133.3 kg) 293 lb 14 oz (133.3 kg) 294 lb 1.5 oz (133.4 kg)    Intake/Output:  06/01 0701 - 06/02 0700 In: 600 [P.O.:600] Out: 2675 [Urine:2675]     Physical Exam: BP 178/91  Pulse 87  Temp(Src) 98.6 F (37 C) (Oral)  Resp 22  Ht 6\' 1"  (1.854 m)  Wt 294 lb 1.5 oz (133.4 kg)  BMI 38.81 kg/m2  SpO2 91%  General: Vital signs reviewed and noted. Multiple tatoos  Head: Normocephalic, atraumatic.  Eyes: conjunctivae/corneas clear.  EOM's intact.   Throat: normal  Neck:  thick  Lungs:  Clear,   Heart:  RR, normal S1, S2  Abdomen:  Soft, non-tender, non-distended    Extremities: No edema   Neurologic: A&O X3, CN II - XII are grossly intact.   Psych: Normal   Skin:  Multiple sebacious cysts on his back , multiple tatoos.  Labs: BMET:  Recent Labs  01/27/13 1954  01/29/13 0615 01/30/13 0505  NA 142  < > 136 136  K 4.0  < > 3.9 3.8  CL 101  < > 95* 96  CO2 33*  < > 31 32  GLUCOSE 217*  < > 218* 175*  BUN 16  < > 15 15  CREATININE 1.16  < > 1.03 1.01  CALCIUM 9.1   < > 9.7 9.6  MG 1.8  --   --   --   < > = values in this interval not displayed.  Liver function tests: No results found for this basename: AST, ALT, ALKPHOS, BILITOT, PROT, ALBUMIN,  in the last 72 hours No results found for this basename: LIPASE, AMYLASE,  in the last 72 hours  CBC:  Recent Labs  01/29/13 0615 01/30/13 0505  WBC 7.0 9.5  HGB 14.1 13.8  HCT 42.0 43.1  MCV 90.7 92.5  PLT 210 234    Cardiac Enzymes: No results found for this basename: CKTOTAL, CKMB, TROPONINI,  in the last 72 hours  Coagulation Studies: No results found for this basename: LABPROT, INR,  in the last 72 hours   Other results:  NSR with no significant arrhythmias for the past several days.  He has had transient AV block ( most of this looks like [place], 5/30 ) and a run of irregular wide complex tachycardia 5/31  Medications:    Infusions:    Scheduled Medications: . amitriptyline  50 mg Oral QHS  . amLODipine  10 mg Oral  Daily  . amoxicillin-clavulanate  1 tablet Oral Q12H  . aspirin  81 mg Oral Daily  . atorvastatin  80 mg Oral q1800  . carvedilol  25 mg Oral BID WC  . cloNIDine  0.1 mg Oral BID  . enoxaparin (LOVENOX) injection  65 mg Subcutaneous Q24H  . insulin aspart  0-20 Units Subcutaneous TID WC  . insulin aspart  0-5 Units Subcutaneous QHS  . insulin aspart protamine- aspart  70 Units Subcutaneous BID WC  . lisinopril  40 mg Oral Daily  . nicotine  14 mg Transdermal Daily  . pregabalin  100 mg Oral BID  . terazosin  1 mg Oral QHS    Assessment/ Plan:      Sepsis: plans per medicine team    Type 2 diabetes, controlled, with retinopathy  Arrhythmias: he had transient Wencheback AV block and a wide complex tachycardia .   I suspect these are related to apnea and subsequent hypoxemia.  He will need to wear his CPAP.      At this point, I would continue to observe.  He needs to take better care of himself.      Hypertension:  His BP is gradually improving.   Internal medicine team to continue to titrate up his BP meds.   I suspect that he would benefit from a daily diuretic (diastolic BP is pretty high).  Echo pending.  His HTN may be exacerbated by his sleep apnea.     Tobacco abuse    Diabetic peripheral neuropathy associated with type 2 diabetes mellitus    HCAP (healthcare-associated pneumonia)  Sleep apnea:  He may need a formal split night sleep study if this has not been done.   No further recs at this time.  If his echo does not show significant abnormalities, we will sign off.   Disposition:  Length of Stay: 6  Thayer Headings, Brooke Bonito., MD, [facility] 01/30/2013, 8:08 AM Office [phone] Pager [phone]

## 2013-01-30 NOTE — Progress Notes (Signed)
Subjective: Patient denies dizziness, denies abdominal pian, denies chest pain, denies sob.  He has 2/10 h/a. H/a is worse when he pushes as area behind his right ear.  Denies nausea or vomiting.    Overnight: patient only tolerated cpap x 1 hour.  He was on setting of 12 and desat. And was still apneic.     Objective: Vital signs in last 24 hours: Filed Vitals:   01/30/13 0421 01/30/13 0500 01/30/13 0717 01/30/13 1134  BP: 151/94  178/91   Pulse: 80  87   Temp: 97.9 F (36.6 C)  98.6 F (37 C) 98.3 F (36.8 C)  TempSrc: Oral  Oral Oral  Resp: 20  22   Height:      Weight:  294 lb 1.5 oz (133.4 kg)    SpO2: 92%  91%    Weight change: 3.5 oz (0.1 kg)  Intake/Output Summary (Last 24 hours) at 01/30/13 1335 Last data filed at 01/30/13 0700  Gross per 24 hour  Intake    120 ml  Output   1900 ml  Net  -1780 ml   Vitals reviewed. General: resting in bed, NAD HEENT: [place]/at, no scleral icterus Cardiac: RRR, no rubs, murmurs or gallops Pulm: clear to auscultation bilaterally, no wheezes, rales, or rhonchi Abd: soft, nontender, nondistended, BS present, obese  Ext: warm and well perfused, no pedal edema Neuro: alert and oriented X3, grossly neurologically intact, moving all 4 extremities  Skin: multiple tattoos   Lab Results: Basic Metabolic Panel:  Recent Labs Lab 01/27/13 0532 01/27/13 1954  01/29/13 0615 01/30/13 0505  NA 137 142  < > 136 136  K 4.0 4.0  < > 3.9 3.8  CL 99 101  < > 95* 96  CO2 30 33*  < > 31 32  GLUCOSE 280* 217*  < > 218* 175*  BUN 17 16  < > 15 15  CREATININE 1.29 1.16  < > 1.03 1.01  CALCIUM 9.2 9.1  < > 9.7 9.6  MG  --  1.8  --   --   --   < > = values in this interval not displayed. Liver Function Tests:  Recent Labs Lab 01/24/13 1320  AST 18  ALT 14  ALKPHOS 114  BILITOT 0.5  PROT 6.3  ALBUMIN 2.3*    Recent Labs Lab 01/24/13 1320  LIPASE 21   CBC:  Recent Labs Lab 01/24/13 1321  01/29/13 0615 01/30/13 0505  WBC 13.6*   < > 7.0 9.5  NEUTROABS 11.3*  --   --   --   HGB 14.7  < > 14.1 13.8  HCT 42.8  < > 42.0 43.1  MCV 89.4  < > 90.7 92.5  PLT 123*  < > 210 234  < > = values in this interval not displayed. Cardiac Enzymes:  Recent Labs Lab 01/24/13 1934 01/24/13 2226 01/25/13 0630  CKTOTAL  --  193  --   CKMB  --  1.5  --   TROPONINI <0.30 <0.30 <0.30   CBG:  Recent Labs Lab 01/29/13 0843 01/29/13 1225 01/29/13 1657 01/29/13 2139 01/30/13 0800 01/30/13 1220  GLUCAP 320* 337* 142* 259* 260* 517*   Urine Drug Screen: Drugs of Abuse     Component Value Date/Time   LABOPIA NEG 06/02/2012 0945   LABOPIA NONE DETECTED 03/05/2010 0940   COCAINSCRNUR NEG 06/02/2012 0945   COCAINSCRNUR NONE DETECTED 03/05/2010 0940   LABBENZ NEG 06/02/2012 0945   LABBENZ NONE DETECTED 03/05/2010 0940  LABBENZ NEG 02/18/2010 2054   AMPHETMU NONE DETECTED 03/05/2010 0940   AMPHETMU NEG 02/18/2010 2054   THCU NONE DETECTED 03/05/2010 0940   LABBARB NEG 06/02/2012 0945   LABBARB  Value: NONE DETECTED        DRUG SCREEN FOR MEDICAL PURPOSES ONLY.  IF CONFIRMATION IS NEEDED FOR ANY PURPOSE, NOTIFY LAB WITHIN 5 DAYS.        LOWEST DETECTABLE LIMITS FOR URINE DRUG SCREEN Drug Class       Cutoff (ng/mL) Amphetamine      1000 Barbiturate      200 Benzodiazepine   [id number] Tricyclics       [id number] Opiates          300 Cocaine          300 THC              50 03/05/2010 0940    Urinalysis: No results found for this basename: COLORURINE, APPERANCEUR, LABSPEC, PHURINE, GLUCOSEU, HGBUR, BILIRUBINUR, KETONESUR, PROTEINUR, UROBILINOGEN, NITRITE, LEUKOCYTESUR,  in the last 168 hours   Micro Results: Recent Results (from the past 240 hour(s))  CULTURE, RESPIRATORY (NON-EXPECTORATED)     Status: None   Collection Time    01/24/13  5:40 PM      Result Value Range Status   Specimen Description SPUTUM   Final   Special Requests NONE   Final   Gram Stain     Final   Value: RARE WBC PRESENT,BOTH PMN AND MONONUCLEAR     RARE SQUAMOUS EPITHELIAL  CELLS PRESENT     MODERATE GRAM POSITIVE COCCI     IN PAIRS   Culture NORMAL OROPHARYNGEAL FLORA   Final   Report Status 01/26/2013 FINAL   Final  CULTURE, BLOOD (ROUTINE X 2)     Status: None   Collection Time    01/24/13  6:44 PM      Result Value Range Status   Specimen Description BLOOD RIGHT FOREARM   Final   Special Requests BOTTLES DRAWN AEROBIC AND ANAEROBIC 10CC   Final   Culture  Setup Time 01/25/2013 05:08   Final   Culture     Final   Value:        BLOOD CULTURE RECEIVED NO GROWTH TO DATE CULTURE WILL BE HELD FOR 5 DAYS BEFORE ISSUING A FINAL NEGATIVE REPORT   Report Status PENDING   Incomplete  CULTURE, BLOOD (ROUTINE X 2)     Status: None   Collection Time    01/24/13  7:34 PM      Result Value Range Status   Specimen Description BLOOD RIGHT HAND   Final   Special Requests BOTTLES DRAWN AEROBIC ONLY [facility]   Final   Culture  Setup Time 01/25/2013 02:32   Final   Culture     Final   Value:        BLOOD CULTURE RECEIVED NO GROWTH TO DATE CULTURE WILL BE HELD FOR 5 DAYS BEFORE ISSUING A FINAL NEGATIVE REPORT   Report Status PENDING   Incomplete  CULTURE, EXPECTORATED SPUTUM-ASSESSMENT     Status: None   Collection Time    01/25/13 11:12 PM      Result Value Range Status   Specimen Description SPUTUM   Final   Special Requests NONE   Final   Sputum evaluation     Final   Value: THIS SPECIMEN IS ACCEPTABLE. RESPIRATORY CULTURE REPORT TO FOLLOW.   Report Status 01/26/2013 FINAL   Final  CULTURE, RESPIRATORY (NON-EXPECTORATED)  Status: None   Collection Time    01/25/13 11:12 PM      Result Value Range Status   Specimen Description SPUTUM   Final   Special Requests NONE   Final   Gram Stain     Final   Value: FEW WBC PRESENT,BOTH PMN AND MONONUCLEAR     RARE SQUAMOUS EPITHELIAL CELLS PRESENT     FEW GRAM POSITIVE COCCI     IN PAIRS RARE GRAM POSITIVE RODS   Culture NORMAL OROPHARYNGEAL FLORA   Final   Report Status 01/28/2013 FINAL   Final  MRSA PCR SCREENING      Status: None   Collection Time    01/29/13  5:54 AM      Result Value Range Status   MRSA by PCR NEGATIVE  NEGATIVE Final   Comment:            The GeneXpert MRSA Assay (FDA     approved for NASAL specimens     only), is one component of a     comprehensive MRSA colonization     surveillance program. It is not     intended to diagnose MRSA     infection nor to guide or     monitor treatment for     MRSA infections.   Studies/Results: Dg Chest 2 View  01/28/2013   *RADIOLOGY REPORT*  Clinical Data: Shortness breath.  Pneumonia.  CHEST - 2 VIEW  Comparison: 01/24/2013  Findings: Decrease in central left lower lobe airspace disease seen since prior study.  Diffuse interstitial prominence is stable.  No evidence of pleural effusion.  Mild cardiomegaly is also stable.  IMPRESSION:  1.  Decreased central left lower lobe airspace disease since prior exam. 2.  Stable cardiomegaly and diffuse interstitial prominence.   Original Report Authenticated By: Earle Gell, M.D.   Medications: I have reviewed the patient's current medications. Scheduled Meds: . amitriptyline  50 mg Oral QHS  . amLODipine  10 mg Oral Daily  . amoxicillin-clavulanate  1 tablet Oral Q12H  . aspirin  81 mg Oral Daily  . atorvastatin  80 mg Oral q1800  . carvedilol  25 mg Oral BID WC  . cloNIDine  0.1 mg Oral BID  . enoxaparin (LOVENOX) injection  65 mg Subcutaneous Q24H  . insulin aspart  0-20 Units Subcutaneous TID WC  . insulin aspart  0-5 Units Subcutaneous QHS  . insulin aspart protamine- aspart  70 Units Subcutaneous BID WC  . lisinopril  40 mg Oral Daily  . nicotine  14 mg Transdermal Daily  . pregabalin  100 mg Oral BID  . terazosin  1 mg Oral QHS   Continuous Infusions:  PRN Meds:.acetaminophen, albuterol, hydrALAZINE, nitroGLYCERIN, promethazine  Assessment/Plan: 47 yo M with h/o uncontrolled HTN, uncontrolled DM, dyslipidemia & peripheral neuropathy presented on 01/24/13 with worsening SOB and chest  pain.   1. 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  -Cardiology was consulted and felt that this was possibly caused by increased vagal tone during apnea episode, also in setting of pneumonia and significant HTN.  -CPAP was started patient intermittently using due to inability to tolerate - Coreg 12.5mg  increased to 25 mg po BID, Lipitor 40 mg, norvasc 10, prn Hydralazine, Clonidine 0.1 mg bid  -cards w/o further recs if echo normal they will sign off and Dr. Acie Fredrickson contacted  about BP med options and did not have any input he states he will leave it up to Internal Medicine  2. HTN, uncontrolled   -BP this am 178/91 which is somewhat improved but still elevated  -Coreg 12.5mg  increased to 25 mg po BID, Lipitor 40 mg, norvasc 10, prn Hydralazine 5-10 mg, Clonidine 0.1 mg bid  - 2D ECHO and renal arterial duplex pending (prelim results negative for renal arterial duplex)  3. DM  -Most recent A1c 7.4 on 01/17/13. Hyperglycemic this admission and today 517 due to missing medication when NPO -Home regimen includes Lantus 40u BID & Novolog 8-10u TID with meals and HS. Patient was having trouble affording.   -Changed Lantus to 70/30 on 5/30, as it is more affordable as an outpatient. Current dose 70u BID, SSI resistant +meals   4. Sepsis, resolved. -Likely due to CAP with treatment failure with Azithromycin outpatient. Levaquin started in the ED and continued on admission without improvement of sx. Broad spectrum coverage initiated with Zosyn and Vancomycin x 2 days.  -HIV negative. Legionella Ag and Strep Pneumo urinary Ag are both negative, blood cultures NTD, sputum cx NGTD, normal flora. -Transitioned to po abx on 5/30 and he has remained afebrile.  On Augmentin 875-125mg  po q12h, day 4/10 - O2 therapy as  needed for O2 Sat > 92%  - Tylenol prn - Follow up CXR in 6 weeks to ensure resolution of PNA, especially given history of tobacco abuse  -will try to ambulate with pulse ox today and record   5.Chest pain, resolved  -Pt reported left sided chest pain present even when not coughing. Pain not reproducible on exam. No EKG changes. Troponins negative x 3.  6. AKI: Cr 1.32 on admission. Trended up to 1.45.  Possibly prerenal. Started on IVF on admission, which has been stopped. With improved po intake, Cr 1.01 today - AM BMP  7. Suspected OSA -pt is not tolerating long term use of cpap overnight setting was at 12 and still desating. Called RT to see if they can configure cpap settings -will need outpatient sleep study -will send home with cpap until sleep study. RT called to try to set home settings for the patient  8. Tobacco abuse:  -cessation and patch  9. DVT PPx:  -Lovenox, SCDs   10. F/E/N -NSL -will monitor and replace electrolytes prn  -NPO for procedure then cardiac diet    Dispo: D/c today or tomorrow. [facility] f/u 02/02/13 at 3:45 PM  The patient does have a current PCP Eulas Post, Brett Canales, MD), therefore will be requiring OPC follow-up after discharge.   The patient does not have transportation limitations that hinder transportation to clinic appointments.  .Services Needed at time of discharge: Y = Yes, Blank = No PT:   OT:   RN:   Equipment: Trying to get CPAP for home use   Other:     LOS: 6 days   Karel, Stege [id number] 01/30/2013, 1:35 PM

## 2013-01-30 NOTE — Progress Notes (Signed)
*  PRELIMINARY RESULTS* Vascular Ultrasound Renal Artery Duplex has been completed.  Preliminary findings: Bilaterally no evidence of renal artery stenosis.  Landry Mellow, RDMS, RVT  01/30/2013, 9:56 AM

## 2013-01-30 NOTE — Progress Notes (Signed)
Pt only wore his CPAP for about an hour tonight explained to patient that he is having significant pause's and heart arrythmia because of his sleep apnea MD already aware and after multiple attempts to get patient to wear his CPAP pt refuses

## 2013-01-31 ENCOUNTER — Telehealth: Payer: Self-pay | Admitting: Dietician

## 2013-01-31 LAB — CULTURE, BLOOD (ROUTINE X 2)
Culture: NO GROWTH
Culture: NO GROWTH

## 2013-01-31 NOTE — Telephone Encounter (Addendum)
Discharge date: 01-30-13 Call date: 01-31-13 Hospital follow up appointment date: 02-02-13 at 3:45 PM with Dr. Eulas Post  Calling to assist with transition of care from hospital to home.  Discharge medications reviewed:No Able to fill all prescriptions? No Patient aware of hospital follow up appointments. Yes No problems with transportation. Other problems/concerns: Patient states unable to afford insulin. Reviewed discharge summary Rx for Novolog 70/30( unsure if this is Novolog Mix 70/30 vs. Novolin 70/30 and guess pharmacy interpreted it as Novolog Mix 70/30). Patient states has enough insulin for today. Advised patient that [facility] has ReliOn 70/30 and ReliOn R insulin over the counter for $25.00/vial. He has doctor appointment tomorrow- suggested he discuss type of insulin with physician and request new Rx.

## 2013-02-02 ENCOUNTER — Encounter: Payer: Self-pay | Admitting: Internal Medicine

## 2013-02-02 ENCOUNTER — Ambulatory Visit (INDEPENDENT_AMBULATORY_CARE_PROVIDER_SITE_OTHER): Payer: BC Managed Care – PPO | Admitting: Internal Medicine

## 2013-02-02 VITALS — BP 157/93 | HR 85 | Temp 97.7°F | Ht 73.0 in | Wt 285.3 lb

## 2013-02-02 DIAGNOSIS — F17200 Nicotine dependence, unspecified, uncomplicated: Secondary | ICD-10-CM

## 2013-02-02 DIAGNOSIS — Z720 Tobacco use: Secondary | ICD-10-CM

## 2013-02-02 DIAGNOSIS — R0609 Other forms of dyspnea: Secondary | ICD-10-CM

## 2013-02-02 DIAGNOSIS — E1149 Type 2 diabetes mellitus with other diabetic neurological complication: Secondary | ICD-10-CM

## 2013-02-02 DIAGNOSIS — Z0000 Encounter for general adult medical examination without abnormal findings: Secondary | ICD-10-CM

## 2013-02-02 DIAGNOSIS — R0989 Other specified symptoms and signs involving the circulatory and respiratory systems: Secondary | ICD-10-CM

## 2013-02-02 DIAGNOSIS — R0600 Dyspnea, unspecified: Secondary | ICD-10-CM

## 2013-02-02 DIAGNOSIS — E785 Hyperlipidemia, unspecified: Secondary | ICD-10-CM

## 2013-02-02 DIAGNOSIS — R32 Unspecified urinary incontinence: Secondary | ICD-10-CM

## 2013-02-02 DIAGNOSIS — G4733 Obstructive sleep apnea (adult) (pediatric): Secondary | ICD-10-CM | POA: Insufficient documentation

## 2013-02-02 DIAGNOSIS — I10 Essential (primary) hypertension: Secondary | ICD-10-CM

## 2013-02-02 DIAGNOSIS — J189 Pneumonia, unspecified organism: Secondary | ICD-10-CM

## 2013-02-02 DIAGNOSIS — E1142 Type 2 diabetes mellitus with diabetic polyneuropathy: Secondary | ICD-10-CM

## 2013-02-02 MED ORDER — PREGABALIN 100 MG PO CAPS: 100.0000 mg | ORAL_CAPSULE | Freq: Two times a day (BID) | ORAL | Status: DC

## 2013-02-02 MED ORDER — INSULIN REGULAR HUMAN 100 UNIT/ML IJ SOLN: 10.0000 [IU] | Freq: Three times a day (TID) | INTRAMUSCULAR | Status: DC

## 2013-02-02 MED ORDER — TERAZOSIN HCL 1 MG PO CAPS: 1.0000 mg | ORAL_CAPSULE | Freq: Every day | ORAL | Status: DC

## 2013-02-02 MED ORDER — LISINOPRIL 40 MG PO TABS: 40.0000 mg | ORAL_TABLET | Freq: Every day | ORAL | Status: DC

## 2013-02-02 MED ORDER — INSULIN NPH ISOPHANE & REGULAR (70-30) 100 UNIT/ML ~~LOC~~ SUSP: 70.0000 [IU] | Freq: Two times a day (BID) | SUBCUTANEOUS | Status: DC

## 2013-02-02 MED ORDER — OMEGA-3-ACID ETHYL ESTERS 1 G PO CAPS: 2.0000 g | ORAL_CAPSULE | Freq: Two times a day (BID) | ORAL | Status: DC

## 2013-02-02 NOTE — Assessment & Plan Note (Signed)
  Assessment: Progress toward smoking cessation:  smoking the same amount Barriers to progress toward smoking cessation:  withdrawal symptoms Comments:   Plan: Instruction/counseling given:  I advised patient to stop smoking. Educational resources provided:  [employer] account [employer]) brochure Self management tools provided:    Medications to assist with smoking cessation:  None Patient agreed to the following self-care plans for smoking cessation:

## 2013-02-02 NOTE — Assessment & Plan Note (Signed)
Lab Results  Component Value Date   HGBA1C 7.4 01/17/2013   HGBA1C 8.5 08/18/2012   HGBA1C 8.0 05/12/2012     Assessment: Diabetes control: fair control Progress toward A1C goal:  improved Comments: Minimal use of coverage insulin. Main barrier to good control continue to be his diet and the cost of insulin.  Plan: Medications:  continue current medications Home glucose monitoring: Frequency: 3 times a day Timing: before meals Instruction/counseling given: reminded to get eye exam and discussed diet Educational resources provided: brochure;handout Self management tools provided: copy of home glucose meter download Other plans: Continuing the 70/30. His CBGs do look better, but I know we can get his control even better. He is to continue checking his CBGs TID before meals and bring his log to his next clinic visit in 2 weeks. I will change his insulin around if needed at that time.

## 2013-02-02 NOTE — Assessment & Plan Note (Signed)
Pt with apneic episodes while asleep. Placed on a CPAP while in the hospital, but is unable to afford one now w/o a sleep study. He is scheduled fro a sleep study on 6/9, but is to reschedule due to a work conflict.

## 2013-02-02 NOTE — Patient Instructions (Signed)
**Continue to take all of your medications as prescribed.   **Be sure to eat after using the 70/30, because your blood sugars can drop too low if you do not eat after using the insulin.   **Be sure to call the Sleep Study folks regarding rescheduling your appointment for sometime soon when it fits into your schedule.    2400 Calorie Diet for Diabetes Meal Planning The 2400 calorie diet is designed for eating up to 2400 calories each day. Following this diet and making healthy meal choices can help improve overall health. This diet controls blood sugar (glucose) levels and can also lower blood pressure and cholesterol.  SERVING SIZES Measuring foods and serving sizes helps to make sure you are getting the right amount of food. The list below tells how big or small some common serving sizes are.  1 oz.........4 stacked dice.  3 oz........Marland KitchenDeck of cards.  1 tsp.......Marland KitchenTip of little finger.  1 tbs......Marland KitchenMarland KitchenThumb.  2 tbs.......Marland KitchenGolf ball.   cup......Marland KitchenHalf of a fist.  1 cup.......Marland KitchenA fist. GUIDELINES FOR CHOOSING FOODS The goal of this diet is to eat a variety of foods and limit calories to 2400 each day. This can be done by choosing foods that are low in calories and in fat. The diet also suggests eating small amounts of food often. Doing this helps control your blood glucose levels so they do not get too high or low. Each meal or snack should contain a protein food source to help you feel more satisfied and to stabilize your blood glucose. Try to eat about the same amount of food around the same time each day. This includes weekend days, travel days, and days off work. Space your meals about 4 to 5 hours apart and add a snack between them if you wish.  For example, a daily food plan could include breakfast, a morning snack, lunch, dinner, and an evening snack. Healthy meals and snacks include whole grains, vegetables, fruits, lean meats, poultry, fish, and dairy products. As you plan your  meals, choose a variety of foods. Choose from the bread and starches, vegetables, fruit, dairy, and meat/protein groups. Examples of foods from each group are listed below with their suggested serving sizes. Use measuring cups and spoons to become familiar with what a healthy portion looks like. Bread and Starch Each serving equals 15 grams of carbohydrates.  1 slice bread.   bagel.   cup cold cereal (unsweetened).   cup hot cereal or mashed potatoes.  1 small potato (size of a computer mouse).   cup cooked pasta or rice.   English muffin.  1 cup broth-based soup.  3 cups of popcorn.  4 to 6 whole-wheat crackers.   cup cooked beans, peas, or corn. Vegetable Each serving equals 5 grams of carbohydrates.   cup cooked vegetables.  1 cup raw vegetables.   cup tomato or vegetable juice. Fruit Each serving equals 15 grams of carbohydrates.  1 small apple or orange.  1 cup watermelon or strawberries.   cup applesauce (no sugar added).  2 tbs raisins.   banana.   cup canned fruit, packed in water, in its own juice, or sweetened with a sugar substitute.   cup unsweetened fruit juice. Dairy Each serving equals 12 to 15 grams of carbohydrates.  1 cup fat-free milk.  6 oz artificially sweetened yogurt or plain yogurt.  1 cup low-fat buttermilk.  1 cup soy milk. Meat/Protein  1 large egg.  2 to 3 oz meat, poultry, or fish.  cup low-fat cottage cheese.  1 tbs peanut butter.   cup tofu.  1 oz low-fat cheese.   cup canned tuna in water. Fat  1 tsp oil.  1 tsp trans-fat-free margarine.  1 tsp butter.  1 tsp mayonnaise.  2 tbs avocado. SAMPLE 2400 CALORIE DIET PLAN Breakfast  1 English muffin (2 carb servings).  1 scrambled egg.  1 tsp margarine.  1 cup fat-free milk (1 carb serving).  1 large orange (2 carb servings). Morning Snack   cup low-fat cottage cheese.   cup canned peaches in juice (1 carb serving).  1  cup carrot sticks. Lunch  Grilled chicken salad.  2 oz chicken breast.  1 cup romaine lettuce or spinach.   cup diced tomato.   cup shredded carrots.   cup sliced cucumbers.  2 tbs low-fat salad dressing.  2 slices whole-wheat bread (2 carb servings).  1 small apple (1 carb serving).  1 cup fat-free milk (1 carb serving).  15 baked chips ( 1 carb serving). Afternoon Snack  8 reduced fat crackers (2 carb servings).  2 tbs peanut butter. Dinner  3 oz salmon, broiled.  4 small red potatoes, roasted with 1 tsp olive oil and seasoning (3 carb servings).  1 cup green beans.  1 cup strawberries (1 carb serving).  1 cup fat-free milk (1 carb serving). Evening Snack  6 cups air-popped popcorn (2 carb servings).  2 tbs parmesan cheese sprinkled on top.  8 almonds. MEAL PLAN Use this worksheet to help you make a daily meal plan based on the 2400 calorie diet suggestions. The total amount of carbohydrates in your meal or snack is more important than making sure you include all of the food groups at every meal or snack. If you are using this plan to help you control your blood glucose, you may interchange carbohydrate-containing foods (dairy, starches, and fruits). Choose a variety of fresh foods of varying colors and flavors. You can choose from the following foods to build your day's meals:  12 Starches.  4 Vegetables.  4 Fruits.  3 Dairy.  7 oz Meat/Protein.  Up to 8 Fats. Your dietician can use this worksheet to help you decide how many servings and what types of foods are right for you. BREAKFAST Food Group and Servings / Food Choice Starch ____________________________________________________ Dairy _____________________________________________________ Fruit _____________________________________________________ Meat/Protein ______________________________________________ Fat________________________________________________________ LUNCH Food Group and  Servings / Food Choice  Starch ___________________________________________________ Meat/Protein _____________________________________________ Vegetable ________________________________________________ Fruit _____________________________________________________ Dairy ____________________________________________________ Fat_______________________________________________________ Jerry Cantrell Food Group and Servings / Food Choice Starch ___________________________________________________ Meat/Protein _____________________________________________ Jerry Cantrell Food Group and Servings / Food Choice Starch ___________________________________________________ Meat/Protein _____________________________________________ Dairy ____________________________________________________ Vegetable ________________________________________________ Fruit _____________________________________________________ Fat_______________________________________________________ Jerry Cantrell Food Group and Servings / Food Choice Fruit _____________________________________________________ Meat/Protein ______________________________________________ Starch ____________________________________________________ DAILY TOTALS Starch __________________________ Vegetable _______________________ Fruits ___________________________ Dairy ___________________________ Meat/Protein ____________________ Fat _____________________________ Document Released: 03/09/2005 Document Revised: 11/09/2011 Document Reviewed: 07/03/2011 [employer] Patient Information 2014 [employer]

## 2013-02-02 NOTE — Assessment & Plan Note (Addendum)
He completed his abx therapy today. No further cough or wheezing. He sounds clear on exam. Emphasized the importance in smoking cessation and provided 1-800-QUIT-NOW information. Will neede to check a f/u CXR in 5 weeks and check PFTS in 4-6 weeks.

## 2013-02-02 NOTE — Progress Notes (Signed)
Patient ID: Jerry Cantrell, male   DOB: December 24, 1965, 47 y.o.   MRN: [id number]  Subjective:   Patient ID: Jerry Cantrell male   DOB: 02/17/1966 47 y.o.   MRN: [id number]  HPI: Jerry Cantrell is a 47 y.o. M with PMH uncontrolled HTN and DM2, who is a current smoker presents to the clinic for a hospital follow up after being treated for CAP and severe uncontrolled HTN and diabetes.  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 He was started on IV abx and transitioned to po Augmentin, which he finished today, and his breathing has been doing much better.   Unfortunately, while in the hospital, his blood pressures and blood sugars were significantly elevated despite being almost maxed out on medication for his blood pressure. Amlodipine and Clonidine were started this admission. He was also noted to have several episodes with a few beats of 2nd degree AV block, which was thought to be non-Wenckebach, so Cardiology was consulted. At that time, his Metoprolol was changed to Coreg. On the day of discharge his blood pressures had improved. At this clinic visit, he states that he just picked up the Coreg and the Clonidine and has run out of the Lisinopril, so he has been off these medications for 4 days.   As an outpatient he has had trouble affording his insulin, and on admission, his blood glucose levels were significantly elevated. The Lantus was changed to Novolin 70/30 70u BID with Novolin R coverage 10u TID with meals for CBGs>250. Since his discharge, he has only used the coverage insulin twice, and endorses that his biggest problem controlling his sugars is his diet.   Past Medical History  Diagnosis Date  . Diabetes mellitus   . Hypertension   . Hyperlipidemia   . Meralgia paraesthetica 05/03/2012  . Lumbar spondylosis 05/03/2012  . Lumbar spinal stenosis 05/03/2012    Mild with only right L4 nerve  root encroachment, no neurogenic claudication   . 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 Urinary incontinence 05/12/2012    Since the start of Sept. 2013   . Hemorrhoids 05/12/2012  . UTI (lower urinary tract infection)   . Meralgia paraesthetica 05/03/2012    On Lyrica which does improve pain.   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 Substance abuse   . Hyperlipidemia   . Pneumonia    Current Outpatient Prescriptions  Medication Sig Dispense Refill  . amitriptyline (ELAVIL) 50 MG tablet Take 50 mg by mouth at bedtime.      Marland Kitchen amLODipine (NORVASC) 10 MG tablet Take 1 tablet (10 mg total) by mouth daily.  30 tablet  1  . amoxicillin-clavulanate (AUGMENTIN) 875-125 MG per tablet Take 1 tablet by mouth every 12 (twelve) hours.  6 tablet  0  . aspirin 81 MG chewable tablet Chew 1 tablet (81 mg total) by mouth daily.  30 tablet  11  . carvedilol (COREG) 25 MG tablet Take 1 tablet (25 mg total) by mouth 2 (two) times daily with a meal.  60 tablet  1  . cloNIDine (CATAPRES) 0.1 MG tablet Take 1 tablet (0.1 mg total) by mouth 2 (two) times daily.  60 tablet  0  . lisinopril (PRINIVIL,ZESTRIL) 40 MG tablet Take 1 tablet (40 mg total) by mouth daily.  90 tablet  6  . omega-3 acid ethyl esters (LOVAZA) 1 G capsule Take 2 capsules (2 g  total) by mouth 2 (two) times daily.  60 capsule  11  . pravastatin (PRAVACHOL) 40 MG tablet Take 1 tablet (40 mg total) by mouth daily.  30 tablet  0  . pregabalin (LYRICA) 100 MG capsule Take 1 capsule (100 mg total) by mouth 2 (two) times daily.  90 capsule  6  . terazosin (HYTRIN) 1 MG capsule Take 1 mg by mouth at bedtime.      Marland Kitchen albuterol (PROVENTIL HFA;VENTOLIN HFA) 108 (90 BASE) MCG/ACT inhaler Inhale 2 puffs into the lungs every 4 (four) hours as needed for wheezing or shortness of breath.  1 Inhaler  3   No current facility-administered medications for this visit.   Family History  Problem Relation Age of Onset  . Cancer Father     Colon cancer- in  remission  . Alcohol abuse Father    History   Social History  . Marital Status: Single    Spouse Name: N/A    Number of Children: N/A  . Years of Education: N/A   Occupational History  . dietary services [employer]   Social History Main Topics  . Smoking status: Current Every Day Smoker -- 0.10 packs/day    Types: Cigarettes  . Smokeless tobacco: Never Used  . Alcohol Use: 7.2 oz/week    12 Cans of beer per week     Comment: occasionally  . Drug Use: None  . Sexually Active: None     Comment: Oral sex only   Other Topics Concern  . None   Social History Narrative  . None   Review of Systems: A 10 point ROS was performed; pertinent positives and negatives were noted in the HPI   Objective:  Physical Exam: Filed Vitals:   02/02/13 1531  BP: 157/93  Pulse: 85  Temp: 97.7 F (36.5 C)  TempSrc: Oral  Height: 6\' 1"  (1.854 m)  Weight: 285 lb 4.8 oz (129.411 kg)  SpO2: 97%   Constitutional: Vital signs reviewed.  Patient is a well-developed and well-nourished male in no acute distress and cooperative with exam. Alert and oriented x3.  Head: Normocephalic and atraumatic Mouth: no erythema or exudates, MMM Eyes: PERRL, EOMI, conjunctivae normal, No scleral icterus.  Neck: Supple, Trachea midline normal ROM, No JVD Cardiovascular: RRR, no MRG, pulses symmetric and intact bilaterally Pulmonary/Chest: Normal respiratory effort, CTAB, no wheezes, rales, or rhonchi Abdominal: Soft. Non-tender, non-distended Musculoskeletal: No joint deformities, erythema Neurological: A&O x3, cranial nerve II-XII are grossly intact, no focal motor deficit Skin: Warm, dry and intact. No rash, cyanosis, or clubbing.  Psychiatric: Normal mood and affect. speech and behavior is normal. Judgment and thought content normal. Cognition and memory are normal.   Assessment & Plan:   Please refer to Problem List based Assessment and Plan

## 2013-02-02 NOTE — Assessment & Plan Note (Signed)
BP Readings from Last 3 Encounters:  02/02/13 157/93  01/30/13 141/79  01/17/13 206/106    Lab Results  Component Value Date   NA 136 01/30/2013   K 3.8 01/30/2013   CREATININE 1.01 01/30/2013    Assessment: Blood pressure control: moderately elevated Progress toward BP goal:  improved Comments: Please see below  Plan: Medications:  continue current medications Educational resources provided: brochure;handout;video Self management tools provided:   Other plans: Please see below  His BP is better controlled today than in the hospital, and he has actually been off the Coreg, Clonidine,a dn Lisinopril since his hospital discharge on 6/2. Continuing all BP meds, and discussed the importance of taking them every day as prescribed, especially the Clonidine which could result in rebound hypertension if he skips doses. He acknowledged understanding.

## 2013-02-03 ENCOUNTER — Telehealth: Payer: Self-pay | Admitting: *Deleted

## 2013-02-03 DIAGNOSIS — E1142 Type 2 diabetes mellitus with diabetic polyneuropathy: Secondary | ICD-10-CM

## 2013-02-03 LAB — BASIC METABOLIC PANEL WITH GFR
CO2: 31 mEq/L (ref 19–32)
GFR, Est African American: 78 mL/min
Glucose, Bld: 62 mg/dL — ABNORMAL LOW (ref 70–99)
Potassium: 4.5 mEq/L (ref 3.5–5.3)
Sodium: 141 mEq/L (ref 135–145)

## 2013-02-03 NOTE — Progress Notes (Signed)
TEACHING ATTENDING ADDENDUM: I discussed this case with Dr. Eulas Post at the time of the patient visit. I agree with the HPI, exam findings and have read the documentation provided by the resident, and I concur with the plan of care. Please see the resident note for details of management.

## 2013-02-03 NOTE — Telephone Encounter (Signed)
Fax from [facility] - for Humulin R insulin, pt's co-pay is $94.00. But Relion Novolin R cost $24.88. If u decide to change, pharmacy needs new rx  Thanks

## 2013-02-06 ENCOUNTER — Encounter (HOSPITAL_BASED_OUTPATIENT_CLINIC_OR_DEPARTMENT_OTHER): Payer: BC Managed Care – PPO

## 2013-02-07 ENCOUNTER — Encounter: Payer: Self-pay | Admitting: Dietician

## 2013-02-08 MED ORDER — INSULIN REGULAR HUMAN 100 UNIT/ML IJ SOLN: INTRAMUSCULAR | Status: DC

## 2013-02-08 NOTE — Telephone Encounter (Signed)
This was sent, let me know if any issues.

## 2013-02-09 ENCOUNTER — Ambulatory Visit (HOSPITAL_COMMUNITY)
Admission: RE | Admit: 2013-02-09 | Discharge: 2013-02-09 | Disposition: A | Discharge status: Home / Self Care | Payer: BC Managed Care – PPO | Source: Ambulatory Visit | Attending: Internal Medicine | Admitting: Internal Medicine

## 2013-02-09 ENCOUNTER — Ambulatory Visit (INDEPENDENT_AMBULATORY_CARE_PROVIDER_SITE_OTHER): Payer: BC Managed Care – PPO | Admitting: Internal Medicine

## 2013-02-09 ENCOUNTER — Encounter: Payer: Self-pay | Admitting: Internal Medicine

## 2013-02-09 VITALS — BP 136/86 | HR 73 | Temp 97.3°F | Ht 72.0 in | Wt 292.0 lb

## 2013-02-09 DIAGNOSIS — R059 Cough, unspecified: Secondary | ICD-10-CM | POA: Insufficient documentation

## 2013-02-09 DIAGNOSIS — I10 Essential (primary) hypertension: Secondary | ICD-10-CM

## 2013-02-09 DIAGNOSIS — E119 Type 2 diabetes mellitus without complications: Secondary | ICD-10-CM

## 2013-02-09 DIAGNOSIS — R05 Cough: Secondary | ICD-10-CM | POA: Insufficient documentation

## 2013-02-09 DIAGNOSIS — R0602 Shortness of breath: Secondary | ICD-10-CM | POA: Insufficient documentation

## 2013-02-09 LAB — GLUCOSE, CAPILLARY: Glucose-Capillary: 212 mg/dL — ABNORMAL HIGH (ref 70–99)

## 2013-02-09 MED ORDER — PSEUDOEPHEDRINE-GUAIFENESIN ER 60-600 MG PO TB12: 1.0000 | ORAL_TABLET | Freq: Two times a day (BID) | ORAL | Status: DC

## 2013-02-09 MED ORDER — INSULIN REGULAR HUMAN 100 UNIT/ML IJ SOLN: INTRAMUSCULAR | Status: DC

## 2013-02-09 MED ORDER — INSULIN NPH (HUMAN) (ISOPHANE) 100 UNIT/ML ~~LOC~~ SUSP: 35.0000 [IU] | Freq: Two times a day (BID) | SUBCUTANEOUS | Status: DC

## 2013-02-09 MED ORDER — MOMETASONE FUROATE 50 MCG/ACT NA SUSP: 2.0000 | Freq: Every day | NASAL | Status: DC

## 2013-02-09 NOTE — Assessment & Plan Note (Signed)
The patient presents with a 4-5 day history of cough, shortly after a hospitalization for pneumonia.  Given the patient's somewhat complicated hospital course, I'd like to be more aggressive in evaluating this cough.  Differential includes recurrence of PNA vs URI vs post-nasal drip. -ordered CXR to evaluation change from prior 2-3 weeks ago -if CXR shows abnormalities, or if symptoms fail to improve in a few days, will consider antibiotics (likely levaquin) -prescribed nasonex for post-nasal drip, given symptoms of rhinorrhea

## 2013-02-09 NOTE — Progress Notes (Signed)
HPI The patient is a 47 y.o. male with a history of DM2, HTN, HL, tobacco use, presenting for an acute visit for cough  The patient notes a 4-5 day history of cough, productive of scant white sputum.  He notes associated symptoms of rhinorrhea and nasal congestion.  He notes no fevers, chills, sore throat.  The patient was hospitalized with pneumonia about 3 weeks ago, treated with IV antibiotics, transitioned to augmentin.  Cough resolved for a while, but then recurred 4-5 days ago.  The patient has a history of DM.  He did not bring his glucometer today, but notes blood sugars in the 100's at home, which is an improvement for him.  The patient is on 70/30, 70 units BID, which he self-lowered to 60 some days due to episodes of hypoglycemia, which he attributes to an irregular eating schedule due to his work schedule.  He is also on novolog, which he adjusts on no set scale, just "playing around with it".  He notes episodes of hypoglycemia 4 days in the last week, typically occurring before lunchtime or mid-afternoon.  ROS: General: no fevers, chills, changes in weight, changes in appetite Skin: no rash HEENT: see HPI Pulm: no dyspnea, wheezing CV: no chest pain, palpitations, shortness of breath Abd: no abdominal pain, nausea/vomiting, diarrhea/constipation GU: no dysuria, hematuria, polyuria Ext: no arthralgias, myalgias Neuro: no weakness, numbness, or tingling  Filed Vitals:   02/09/13 1024  BP: 136/86  Pulse:   Temp:     PEX General: alert, cooperative, patient appears uncomfortable HEENT: pupils equal round and reactive to light, vision grossly intact, oropharynx clear and non-erythematous  Neck: supple, no lymphadenopathy Lungs: mild scattered course breath sounds, which clear with coughing.  No wheezing Heart: regular rate and rhythm, no murmurs, gallops, or rubs Abdomen: soft, non-tender, non-distended, normal bowel sounds Extremities: no cyanosis, clubbing, or  edema Neurologic: alert & oriented X3, cranial nerves II-XII intact, strength grossly intact, sensation intact to light touch  Current Outpatient Prescriptions on File Prior to Visit  Medication Sig Dispense Refill  . albuterol (PROVENTIL HFA;VENTOLIN HFA) 108 (90 BASE) MCG/ACT inhaler Inhale 2 puffs into the lungs every 4 (four) hours as needed for wheezing or shortness of breath.  1 Inhaler  3  . amitriptyline (ELAVIL) 50 MG tablet Take 50 mg by mouth at bedtime.      Marland Kitchen amLODipine (NORVASC) 10 MG tablet Take 1 tablet (10 mg total) by mouth daily.  30 tablet  1  . amoxicillin-clavulanate (AUGMENTIN) 875-125 MG per tablet Take 1 tablet by mouth every 12 (twelve) hours.  6 tablet  0  . aspirin 81 MG chewable tablet Chew 1 tablet (81 mg total) by mouth daily.  30 tablet  11  . carvedilol (COREG) 25 MG tablet Take 1 tablet (25 mg total) by mouth 2 (two) times daily with a meal.  60 tablet  1  . cloNIDine (CATAPRES) 0.1 MG tablet Take 1 tablet (0.1 mg total) by mouth 2 (two) times daily.  60 tablet  0  . insulin NPH-regular (RELION 70/30) (70-30) 100 UNIT/ML injection Inject 70 Units into the skin 2 (two) times daily with a meal.  20 mL  12  . insulin regular (NOVOLIN R,HUMULIN R) 100 units/mL injection Inject 0.1 mLs (10 Units total) into the skin 3 (three) times daily before meals. When your blood sugars are greater than 250.  30 mL  3  . lisinopril (PRINIVIL,ZESTRIL) 40 MG tablet Take 1 tablet (40 mg total) by  mouth daily.  30 tablet  11  . omega-3 acid ethyl esters (LOVAZA) 1 G capsule Take 2 capsules (2 g total) by mouth 2 (two) times daily.  60 capsule  11  . pravastatin (PRAVACHOL) 40 MG tablet Take 1 tablet (40 mg total) by mouth daily.  30 tablet  0  . pregabalin (LYRICA) 100 MG capsule Take 1 capsule (100 mg total) by mouth 2 (two) times daily.  60 capsule  11  . terazosin (HYTRIN) 1 MG capsule Take 1 capsule (1 mg total) by mouth at bedtime.  30 capsule  11   No current  facility-administered medications on file prior to visit.    Assessment/Plan

## 2013-02-09 NOTE — Patient Instructions (Addendum)
General Instructions: For your cough, we are checking a chest x-ray.  If it looks worse, we will contact you, and will likely need to start a course of antibiotics -in the meantime, post-nasal drip can also worsen cough.  Please start using Nasonex, 2 sprays in each nostril once per day.  For your diabetes, we would like to change your insulin regimen: 1. STOP taking your 70/30 insulin.  Instead, start taking insulin NPH (long-acting only), 35 units twice per day. 2. Continue to use Novolin R (short-acting insulin) with your 2 biggest meals of the day, based on your blood sugar before that meal, on the following scale: For blood sugar 150 - 200 inject 1 unit Novolog For blood sugar 200 - 250 inject 2 units Novolog For blood sugar 251 - 300 inject 4 units Novolog For blood sugar 301 - 350 inject 6 units Novolog For blood sugar 351 - 400 inject 8 units Novolog For blood sugar 401 - 450 inject 10 units Novolog For blood sugar 451 - 500 inject 12 units Novolog For blood sugar 501 - 550 inject 14 units Novolog For blood sugar 551 - 600 inject 16 units Novolog and call office, if office is closed call after hours resident for instructions.   Please return for a follow-up visit in 3-4 weeks, and bring your glucometer for review  Treatment Goals:  Goals (1 Years of Data) as of 02/09/13         As of Today As of Today 02/02/13 01/30/13 01/30/13     Blood Pressure    . Blood Pressure < 140/90  136/86 148/94 157/93 141/79 178/91     Lifestyle    . Prevent Falls           Result Component    . HEMOGLOBIN A1C < 7.0          . LDL CALC < 100            Progress Toward Treatment Goals:  Treatment Goal 02/09/2013  Hemoglobin A1C -  Blood pressure -  Stop smoking stopped smoking    Self Care Goals & Plans:  Self Care Goal 02/09/2013  Manage my medications take my medicines as prescribed; bring my medications to every visit; refill my medications on time  Monitor my health keep track of my blood  glucose; bring my glucose meter and log to each visit  Eat healthy foods drink diet soda or water instead of juice or soda; eat more vegetables; eat foods that are low in salt; eat baked foods instead of fried foods; eat fruit for snacks and desserts; eat smaller portions  Be physically active find time in my schedule; park at the far end of the parking lot; take the stairs instead of the elevator  Stop smoking go to the [place] website (https://scott-book[facility].info/); call [facility] (1-800-QUIT-NOW)    Home Blood Glucose Monitoring 02/02/2013  Check my blood sugar 3 times a day  When to check my blood sugar before meals     Care Management & Community Referrals:

## 2013-02-09 NOTE — Assessment & Plan Note (Signed)
Lab Results  Component Value Date   HGBA1C 7.4 01/17/2013   HGBA1C 8.5 08/18/2012   HGBA1C 8.0 05/12/2012     Assessment: Diabetes control: fair control Progress toward A1C goal:  unchanged Comments: The patient's last A1C was fairly good at 7.4, but he notes episodes of hypoglycemia since switching from his prior regimen of lantus and novolog to 70/30 and novolog.  I'm concerned about his "double coverage" of short-acting insulin, both as part of 70/30 and as separate injections.  As such, I'd like to replace 70/30 with NPH, and continue short-acting novolog per a standardized sliding scale.  Plan: Medications:  Discontinue 70/30.  Start NPH 35 units BID.  Continue novolog twice per day with the patient's 2 largest meals (patient only eats about 2 full meals per day), but gave patient a specific sliding scale (see below). Home glucose monitoring: Frequency: 2 times a day Timing: before meals Instruction/counseling given: reminded to bring blood glucose meter & log to each visit Educational resources provided:   Self management tools provided:   Other plans: Recheck blood sugars in 4 weeks  Check blood sugar before each meal.  For blood sugar 150 - 200 inject 1 unit Novolog For blood sugar 200 - 250 inject 2 units Novolog For blood sugar 251 - 300 inject 4 units Novolog For blood sugar 301 - 350 inject 6 units Novolog For blood sugar 351 - 400 inject 8 units Novolog For blood sugar 401 - 450 inject 10 units Novolog For blood sugar 451 - 500 inject 12 units Novolog For blood sugar 501 - 550 inject 14 units Novolog For blood sugar 551 - 600 inject 16 units Novolog and call office, if office is closed call after hours resident for instructions.

## 2013-02-09 NOTE — Addendum Note (Signed)
Addended by: Hester Mates on: 02/09/2013 11:27 AM   Modules accepted: Orders

## 2013-02-09 NOTE — Assessment & Plan Note (Signed)
BP Readings from Last 3 Encounters:  02/09/13 136/86  02/02/13 157/93  01/30/13 141/79    Lab Results  Component Value Date   NA 141 02/02/2013   K 4.5 02/02/2013   CREATININE 1.26 02/02/2013    Assessment: Blood pressure control: controlled Progress toward BP goal:  at goal Comments: BP was initially mildly elevated, but upon recheck was better controlled.  Plan: Medications:  continue current medications Educational resources provided:   Self management tools provided:   Other plans: Recheck at next visit

## 2013-02-10 NOTE — Progress Notes (Signed)
TEACHING ATTENDING ADDENDUM: I discussed this case with Dr. Owens Shark at the time of the patient visit. I agree with the HPI, exam findings and have read the documentation provided by the resident,  and I concur with the plan of care. Please see the resident note for details of management.

## 2013-03-09 ENCOUNTER — Other Ambulatory Visit: Payer: Self-pay

## 2013-03-14 ENCOUNTER — Telehealth: Payer: Self-pay | Admitting: Internal Medicine

## 2013-03-14 NOTE — Telephone Encounter (Signed)
Called Dr.'s office for status of resch his missed appointment .Patient was sch for 11/30/2012 with [facility].  Patient has not made any more attempts to resch as well.

## 2013-03-17 ENCOUNTER — Other Ambulatory Visit (HOSPITAL_COMMUNITY): Payer: Self-pay | Admitting: Internal Medicine

## 2013-03-27 ENCOUNTER — Ambulatory Visit: Payer: BC Managed Care – PPO | Admitting: Internal Medicine

## 2013-03-27 VITALS — Ht 62.5 in

## 2013-03-28 NOTE — Progress Notes (Signed)
  Subjective:    Patient ID: Jerry Cantrell, male    DOB: 07-Oct-1965, 47 y.o.   MRN: [id number]  HPI Comments: No show     Review of Systems     Objective:   Physical Exam        Assessment & Plan:

## 2013-03-28 NOTE — Addendum Note (Signed)
Addended by: Larey Dresser A on: 03/28/2013 01:45 PM   Modules accepted: Miquel Dunn

## 2013-04-12 ENCOUNTER — Ambulatory Visit (INDEPENDENT_AMBULATORY_CARE_PROVIDER_SITE_OTHER): Payer: BC Managed Care – PPO | Admitting: Internal Medicine

## 2013-04-12 ENCOUNTER — Encounter: Payer: Self-pay | Admitting: Internal Medicine

## 2013-04-12 VITALS — BP 171/106 | HR 84 | Temp 98.0°F | Ht 72.0 in | Wt 298.6 lb

## 2013-04-12 DIAGNOSIS — Z794 Long term (current) use of insulin: Secondary | ICD-10-CM

## 2013-04-12 DIAGNOSIS — M722 Plantar fascial fibromatosis: Secondary | ICD-10-CM

## 2013-04-12 DIAGNOSIS — G4733 Obstructive sleep apnea (adult) (pediatric): Secondary | ICD-10-CM

## 2013-04-12 DIAGNOSIS — I10 Essential (primary) hypertension: Secondary | ICD-10-CM

## 2013-04-12 DIAGNOSIS — H53452 Other localized visual field defect, left eye: Secondary | ICD-10-CM

## 2013-04-12 DIAGNOSIS — R05 Cough: Secondary | ICD-10-CM

## 2013-04-12 DIAGNOSIS — H53459 Other localized visual field defect, unspecified eye: Secondary | ICD-10-CM

## 2013-04-12 DIAGNOSIS — E119 Type 2 diabetes mellitus without complications: Secondary | ICD-10-CM

## 2013-04-12 DIAGNOSIS — R059 Cough, unspecified: Secondary | ICD-10-CM

## 2013-04-12 LAB — GLUCOSE, CAPILLARY: Glucose-Capillary: 207 mg/dL — ABNORMAL HIGH (ref 70–99)

## 2013-04-12 LAB — POCT GLYCOSYLATED HEMOGLOBIN (HGB A1C): Hemoglobin A1C: 7.6

## 2013-04-12 MED ORDER — NAPROXEN SODIUM 220 MG PO CAPS: 220.0000 mg | ORAL_CAPSULE | Freq: Two times a day (BID) | ORAL | Status: DC | PRN

## 2013-04-12 MED ORDER — GLUCOSE BLOOD VI STRP: ORAL_STRIP | Status: DC

## 2013-04-12 MED ORDER — FUROSEMIDE 20 MG PO TABS: 20.0000 mg | ORAL_TABLET | Freq: Every day | ORAL | Status: DC

## 2013-04-12 NOTE — Assessment & Plan Note (Signed)
BP Readings from Last 3 Encounters:  04/12/13 171/106  02/09/13 136/86  02/02/13 157/93    Lab Results  Component Value Date   NA 141 02/02/2013   K 4.5 02/02/2013   CREATININE 1.26 02/02/2013    Assessment: Blood pressure control: mildly elevated Progress toward BP goal:  deteriorated Comments: He brought all his medications with him today. He has not been taking clonidine. He is taking Norvasc 10mg  daily, lisinopril 40mg  daily, Coreg 25mg  BID, and terazosin 1mg  qHS.   Plan: Medications:  continue current medications, discontinue Clonidine, add Lasix 20mg  daily Educational resources provided: brochure Self management tools provided:   Other plans: Pt to follow up next week for BP reassessment.

## 2013-04-12 NOTE — Assessment & Plan Note (Signed)
Describes significant improvement of his cough. Will not repeat CXR at this time as follow up CXR demonstrated improvement of Left lower lobe PNA. (Discussed case with Dr. Lynnae January)

## 2013-04-12 NOTE — Assessment & Plan Note (Signed)
Pt cancelled sleep study due to concerns of not being able to afford CPAP machine.

## 2013-04-12 NOTE — Assessment & Plan Note (Addendum)
Lab Results  Component Value Date   HGBA1C 7.6 04/12/2013   HGBA1C 7.4 01/17/2013   HGBA1C 8.5 08/18/2012     Assessment: Diabetes control: fair control Progress toward A1C goal:  unchanged Comments: Pt continues to use Novolin 70/30 and Novolog R as SSI. He has a complex way of checking his CBG and determining how much insulin to given himself. He uses Novolin 70/30 35-40 units in the morning and 25-30 units in the afternoon as well as up to 80 units of Novolog R divided in three doses per day. I explained to him that this "double coverage" of short acting insulin may not be ideal. He explains that he has not started the NPH because he just got used to the 70/30 with Novolog R but is willing to start using the NPH with Novolog R SSI. Currently he has no strips for his CBG meter.  He denies hypoglycemia. His CBG meter log shows an average CBG of 260s but there is a wide range from 81 to 507, with standard deviation of 111 and many values in the low 200s. Graphic analysis of this data does not correlate with a predictable pattern for AM, PM, or before bed values.   Plan: Medications:  continue current medications Home glucose monitoring: Frequency: 3 times a day Timing: before breakfast;before lunch;at bedtime Instruction/counseling given: reminded to get eye exam, reminded to bring blood glucose meter & log to each visit, reminded to bring medications to each visit and discussed foot care Educational resources provided: brochure Self management tools provided: copy of home glucose meter download Other plans: Given the complexity of his current insulin regimen, he will follow up next week for a visit to discuss his diabetes. He will continue his current regimen for now. Provided Rx for strips for his One Touch Ultra blue CBG meter. May consider referral to Endocrinology. Discussed case with Debera Lat, pt will benefit from further diabetes education.

## 2013-04-12 NOTE — Assessment & Plan Note (Signed)
Symptoms started lat week. No recent trauma/injury/increase exercise.  Rx Naproxen 220mg  BID PRN for pain.  Pt instructed to wear cushion/insoles for bl feet.  Referral to Sports Medicine.

## 2013-04-12 NOTE — Assessment & Plan Note (Signed)
Acute vision change, this could be secondary to burst blood vessel or secondary to his elevated BP. No eye pain concerning for closed angle glaucoma. Referred to Ophthalmology.

## 2013-04-12 NOTE — Patient Instructions (Addendum)
General Instructions: -Start taking Lasix 20mg  daily.  -You have been referred to Sports Medicine for you foot pain. Take Naproxen 220mg  twice per day as needed for the foot pain. Wear gel/cushion insert for your foot pain.  -You were referred to the eye doctor for evaluation of your left eye decreased vision.  -Follow up next week for your blood pressure and for your diabetes.     Treatment Goals:  Goals (1 Years of Data) as of 04/12/13         As of Today 02/09/13 02/09/13 02/02/13 01/30/13     Blood Pressure    . Blood Pressure < 140/90  171/106 136/86 148/94 157/93 141/79     Lifestyle    . Prevent Falls           Result Component    . HEMOGLOBIN A1C < 7.0  7.6        . LDL CALC < 100            Progress Toward Treatment Goals:  Treatment Goal 04/12/2013  Hemoglobin A1C unchanged  Blood pressure deteriorated  Stop smoking smoking less    Self Care Goals & Plans:  Self Care Goal 04/12/2013  Manage my medications take my medicines as prescribed; bring my medications to every visit  Monitor my health keep track of my blood glucose; bring my glucose meter and log to each visit  Eat healthy foods eat more vegetables; eat foods that are low in salt; eat baked foods instead of fried foods  Be physically active -  Stop smoking -  Meeting treatment goals maintain the current self-care plan    Home Blood Glucose Monitoring 04/12/2013  Check my blood sugar 3 times a day  When to check my blood sugar before breakfast; before lunch; at bedtime     Care Management & Community Referrals:  Referral 04/12/2013  Referrals made for care management support none needed  Referrals made to community resources none

## 2013-04-12 NOTE — Progress Notes (Signed)
  Subjective:    Patient ID: Jerry Cantrell, male    DOB: 1966-08-25, 47 y.o.   MRN: [id number]  HPI Mr. Dedrick is a 47 year old man with PMH significant for DM2, HTN, and obesity who comes in for evaluation of decreased left eye peripheral vision and right foot pain. He reports that for the pat day his left eye has decreased peripheral vision, he is concerned he has a blood vessel that burst in that eye as a similar problem happened to his right eye in the past. He denies eye pain, discharge, or increased pressure, eye injury/trauma. He also complains of right foot plantar pain. The pain is present in the morning when he wakes up and is worse at the end of the day. 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 He wears tennis shoes to work. He denies recent running/jogging, increased walking, or trauma/injury to the foot.   He states that some of his medications are expensive but he has been taking his BP medications and his insulin.   He has not been to his sleep study as he was afraid the CPAP machine would cost too much.   His cough has improved significantly, he states that he get a "smoker's cough" occasionally.   Review of Systems  Constitutional: Negative for fever, chills, activity change, appetite change and fatigue.  HENT: Negative for sore throat and facial swelling.   Eyes: Positive for visual disturbance. Negative for photophobia, pain, discharge, redness and itching.       Decreased peripheral vision of the Left eye.  Respiratory: Positive for shortness of breath. Negative for cough, choking and wheezing.        Chronic DOE  Cardiovascular: Negative for chest pain, palpitations and leg swelling.  Gastrointestinal: Negative for abdominal pain and abdominal distention.  Genitourinary: Negative for dysuria.  Skin: Negative for color change, pallor, rash and wound.  Neurological: Negative for dizziness, tremors and headaches.  Hematological: Negative for adenopathy.   Psychiatric/Behavioral: Negative for behavioral problems and agitation.       Objective:   Physical Exam  Nursing note and vitals reviewed. Constitutional: He is oriented to person, place, and time. He appears well-developed and well-nourished. No distress.  Obese  HENT:  Left Ear: External ear normal.  Eyes: Conjunctivae and EOM are normal. Pupils are equal, round, and reactive to light. Right eye exhibits no discharge. Left eye exhibits no discharge. No scleral icterus.  Left eye with decreased peripheral vision. No sign of injury or trauma.   Cardiovascular: Normal rate and regular rhythm.   Pulmonary/Chest: Effort normal and breath sounds normal. No respiratory distress. He has no wheezes. He has no rales.  Abdominal: Soft.  Musculoskeletal: Normal range of motion. He exhibits edema. He exhibits no tenderness.  1+ pitting edema bilaterally up to his knees.  Right plantar foot tender to palpation with no bruising, no point tenderness. Calf squeeze test negative (plantar flexion noted). No tenderness to palpation of posterior heel.   Neurological: He is alert and oriented to person, place, and time.  Skin: Skin is warm and dry. No rash noted. He is not diaphoretic. No erythema. No pallor.  Psychiatric: He has a normal mood and affect. His behavior is normal.          Assessment & Plan:

## 2013-04-13 NOTE — Progress Notes (Signed)
Case discussed with Dr. Hayes Ludwig soon after the resident saw the patient.  We reviewed the resident's history and exam and pertinent patient test results.  I agree with the assessment, diagnosis, and plan of care documented in the resident's note. I agreed with not checking a 6 wk CXR. I have reviewed the UTD article and at 90 days after hospital tx CAP, only 1% pts are dx with cancer and at 5 yrs, only 2%. The highest risk are pts over age 47.

## 2013-04-19 ENCOUNTER — Ambulatory Visit: Payer: BC Managed Care – PPO | Admitting: Sports Medicine

## 2013-04-19 ENCOUNTER — Encounter: Payer: Self-pay | Admitting: Internal Medicine

## 2013-04-19 ENCOUNTER — Ambulatory Visit: Payer: BC Managed Care – PPO | Admitting: Internal Medicine

## 2013-04-20 ENCOUNTER — Encounter: Payer: Self-pay | Admitting: Internal Medicine

## 2013-04-20 ENCOUNTER — Ambulatory Visit (INDEPENDENT_AMBULATORY_CARE_PROVIDER_SITE_OTHER): Payer: BC Managed Care – PPO | Admitting: Internal Medicine

## 2013-04-20 VITALS — BP 145/91 | HR 80 | Temp 98.9°F | Wt 301.4 lb

## 2013-04-20 DIAGNOSIS — E119 Type 2 diabetes mellitus without complications: Secondary | ICD-10-CM

## 2013-04-20 DIAGNOSIS — I10 Essential (primary) hypertension: Secondary | ICD-10-CM

## 2013-04-20 DIAGNOSIS — H5789 Other specified disorders of eye and adnexa: Secondary | ICD-10-CM

## 2013-04-20 DIAGNOSIS — H579 Unspecified disorder of eye and adnexa: Secondary | ICD-10-CM

## 2013-04-20 DIAGNOSIS — E785 Hyperlipidemia, unspecified: Secondary | ICD-10-CM

## 2013-04-20 DIAGNOSIS — Z794 Long term (current) use of insulin: Secondary | ICD-10-CM

## 2013-04-20 DIAGNOSIS — N529 Male erectile dysfunction, unspecified: Secondary | ICD-10-CM

## 2013-04-20 DIAGNOSIS — E1169 Type 2 diabetes mellitus with other specified complication: Secondary | ICD-10-CM

## 2013-04-20 MED ORDER — GLUCOSE BLOOD VI STRP: ORAL_STRIP | Status: DC

## 2013-04-20 MED ORDER — TADALAFIL 10 MG PO TABS: 10.0000 mg | ORAL_TABLET | ORAL | Status: DC | PRN

## 2013-04-20 MED ORDER — INSULIN NPH (HUMAN) (ISOPHANE) 100 UNIT/ML ~~LOC~~ SUSP: 35.0000 [IU] | Freq: Two times a day (BID) | SUBCUTANEOUS | Status: DC

## 2013-04-20 MED ORDER — LOVASTATIN 20 MG PO TABS: 40.0000 mg | ORAL_TABLET | Freq: Every day | ORAL | Status: DC

## 2013-04-20 MED ORDER — INSULIN REGULAR HUMAN 100 UNIT/ML IJ SOLN: INTRAMUSCULAR | Status: DC

## 2013-04-20 NOTE — Patient Instructions (Addendum)
-  Let us know if you will not be able to afford your blood pressure medications.  -You cholesterol medication has been changed to Lovastatin 20mg  twice per day.  -Call us if your have problems paying for your insulin or strips.  -Follow up with [place] next week. You may make an appointment with [name] to further discussion of your diet.  This is the Sliding Scale for your insulin (per Dr. Owens Shark):  Check blood sugar before each meal.  For blood sugar 150 - 200 inject 1 unit Novolog  For blood sugar 200 - 250 inject 2 units Novolog  For blood sugar 251 - 300 inject 4 units Novolog  For blood sugar 301 - 350 inject 6 units Novolog  For blood sugar 351 - 400 inject 8 units Novolog  For blood sugar 401 - 450 inject 10 units Novolog  For blood sugar 451 - 500 inject 12 units Novolog  For blood sugar 501 - 550 inject 14 units Novolog  For blood sugar 551 - 600 inject 16 units Novolog and call office, if office is closed call after hours resident for instructions.

## 2013-04-21 ENCOUNTER — Ambulatory Visit: Payer: BC Managed Care – PPO | Admitting: Internal Medicine

## 2013-04-21 ENCOUNTER — Ambulatory Visit: Payer: BC Managed Care – PPO | Admitting: Family Medicine

## 2013-04-22 DIAGNOSIS — E1169 Type 2 diabetes mellitus with other specified complication: Secondary | ICD-10-CM | POA: Insufficient documentation

## 2013-04-22 DIAGNOSIS — H5789 Other specified disorders of eye and adnexa: Secondary | ICD-10-CM | POA: Insufficient documentation

## 2013-04-22 NOTE — Assessment & Plan Note (Signed)
BP Readings from Last 3 Encounters:  04/20/13 145/91  04/12/13 171/106  02/09/13 136/86    Lab Results  Component Value Date   NA 141 02/02/2013   K 4.5 02/02/2013   CREATININE 1.26 02/02/2013    Assessment: Blood pressure control:  Not controlled Progress toward BP goal:   deteriorated  Comments: Pt not sure if he was taken all his meds this month due to cost, he thinks he may not have been taking Norvasc and Terazosin. He is prescribed Coreg 25mg  BID, Norvasc 10mg  daily, lisinopril 40mg  daily.   Plan: Medications:  continue current medications Educational resources provided:   Self management tools provided:   Other plans: No medication changes. Pt states that he may be able to purchase all his medications with his next paycheck next week. Pt to follow up next week.

## 2013-04-22 NOTE — Assessment & Plan Note (Signed)
Chronic problem for years.  Rx Cialis 10mg  PRN #10.  Pt warned of common side effects including hypotension.

## 2013-04-22 NOTE — Assessment & Plan Note (Signed)
Small area of conjunctiva irritation secondary to curled eyelashes. No evidence of conjunctivitis on physical exam.  Trimmed left eyelashes during this visit, pt stated that the irritation in his left eye was improved.  Pt has eye exam appointment soon.

## 2013-04-22 NOTE — Progress Notes (Signed)
  Subjective:    Patient ID: Jerry Cantrell, male    DOB: [birth year], 47 y.o.   MRN: [id number]  HPI Jerry Cantrell is a 47 year old man with PMH significant for DM2, HTN, who comes in for follow up visit for his diabetes and evaluation of left eye "scratching" sensation. He states that his medications have been costly, about $150 per month due to his insulin requirement amounting to almost 5 vials per month. He has been taking some of his oral medication but may be out of some of his blood pressure medications. He gets paid next week and will try to fill all his medicines at that time.  He has not picked up his CBG meter strips yet and continues to use SSI insulin gaging the amount of carbohydrates he is eating, without checking his CBG. He may have enough insulin to last until his next paycheck but he is not sure. He denies hypoglycemia symptoms such as tremors, blurry vision, irritability, or headaches.  His right foot continues to hurt but he has Sports Medicine appointment for this week.  His left eye has a "scratching" sensation but no drainage. He thinks his left eyelashes may be too long and rubbing against his conjunctiva.      Review of Systems  Constitutional: Negative for fever, chills, diaphoresis, activity change, appetite change, fatigue and unexpected weight change.  Eyes: Positive for itching and visual disturbance. Negative for pain and discharge.       Decreased vision in his right eye   Respiratory: Negative for cough, chest tightness, shortness of breath and wheezing.   Cardiovascular: Negative for chest pain, palpitations and leg swelling.  Gastrointestinal: Negative for abdominal pain.  Genitourinary: Negative for dysuria.  Musculoskeletal: Positive for myalgias and back pain.       Right foot pain. Chronic back pain.  Neurological: Negative for dizziness, light-headedness and headaches.  Psychiatric/Behavioral: Negative for agitation.       Objective:   Physical Exam   Nursing note and vitals reviewed. Constitutional: He is oriented to person, place, and time. No distress.  Morbidly obese  Eyes: Pupils are equal, round, and reactive to light. Right eye exhibits no discharge. Left eye exhibits no discharge.  Left conjunctiva with mild erythema, no drainage, no trauma. Left eyelashes long and curled into the eye.   Cardiovascular: Normal rate and regular rhythm.   Pulmonary/Chest: Effort normal. No respiratory distress. He has no wheezes. He has no rales.  Abdominal: Soft. There is no tenderness.  Musculoskeletal: He exhibits no edema and no tenderness.  Neurological: He is alert and oriented to person, place, and time.  Skin: Skin is warm and dry. No rash noted. He is not diaphoretic. No erythema. No pallor.  Psychiatric: He has a normal mood and affect. His behavior is normal.          Assessment & Plan:

## 2013-04-22 NOTE — Assessment & Plan Note (Signed)
Lab Results  Component Value Date   HGBA1C 7.6 04/12/2013   HGBA1C 7.4 01/17/2013   HGBA1C 8.5 08/18/2012     Assessment: Diabetes control:  Not controlled Progress toward A1C goal:   Not at goal Comments: He is currently still suing "double coverage" of short acting with Novolin 70/30 BID and Novolin R SSI based on carb count. He is very interested in starting the Novolin NPH BID and continuing to use Novolin R for SSI. He wants to get his CBG meter strips before making the switch and needs to wait until next week when he gets paid again to purchase the new insulin. He has 70/30 insulin that will last until next week but may run out of Novolin R by next week.   Plan: Medications:  Will change insulin to NPH 35 units BID for now (may need to go up as he was on Lantus 70units BID at some pont in the past), continue Novolin R SSI as already provided to him byt Dr. Owens Shark. Note that pt has been using up to 20 units of Novolin R and may need a more "resistant" SSI  Home glucose monitoring: Will start to check CBG 5 times per day once he is able to purchase his meter strips Frequency:   Timing:   Instruction/counseling given: reminded to get eye exam, reminded to bring blood glucose meter & log to each visit and discussed the need for weight loss Educational resources provided:   Self management tools provided:   Other plans: Pt to follow up with [place] in one week if he runs out of his Novolin or has problems purchasing the NPH insulin. He met with Debera Lat during this visit and was given a detailed meal plan and carb count instructions. Pt also given SSI instructions. He has eye exam appointment coming up.

## 2013-04-25 NOTE — Progress Notes (Signed)
Case discussed with Dr. Kennerly soon after the resident saw the patient.  We reviewed the resident's history and exam and pertinent patient test results.  I agree with the assessment, diagnosis, and plan of care documented in the resident's note. 

## 2013-04-27 ENCOUNTER — Encounter: Payer: Self-pay | Admitting: Sports Medicine

## 2013-04-27 ENCOUNTER — Ambulatory Visit (INDEPENDENT_AMBULATORY_CARE_PROVIDER_SITE_OTHER): Payer: BC Managed Care – PPO | Admitting: Sports Medicine

## 2013-04-27 ENCOUNTER — Other Ambulatory Visit: Payer: Self-pay | Admitting: *Deleted

## 2013-04-27 VITALS — BP 178/107 | Ht 72.0 in | Wt 289.0 lb

## 2013-04-27 DIAGNOSIS — M79673 Pain in unspecified foot: Secondary | ICD-10-CM

## 2013-04-27 DIAGNOSIS — R0600 Dyspnea, unspecified: Secondary | ICD-10-CM

## 2013-04-27 DIAGNOSIS — M79609 Pain in unspecified limb: Secondary | ICD-10-CM

## 2013-04-27 DIAGNOSIS — R269 Unspecified abnormalities of gait and mobility: Secondary | ICD-10-CM

## 2013-04-28 MED ORDER — PREGABALIN 100 MG PO CAPS: 100.0000 mg | ORAL_CAPSULE | Freq: Two times a day (BID) | ORAL | Status: DC

## 2013-04-28 MED ORDER — CARVEDILOL 25 MG PO TABS: 25.0000 mg | ORAL_TABLET | Freq: Two times a day (BID) | ORAL | Status: DC

## 2013-04-28 MED ORDER — AMLODIPINE BESYLATE 10 MG PO TABS: 10.0000 mg | ORAL_TABLET | Freq: Every day | ORAL | Status: DC

## 2013-04-28 MED ORDER — INSULIN SYRINGES (DISPOSABLE) U-100 0.5 ML MISC: Status: DC

## 2013-04-28 NOTE — Progress Notes (Signed)
  Subjective:    Patient ID: Jerry Cantrell, male    DOB: [birth year], 47 y.o.   MRN: [id number]  HPI chief complaint: Right foot pain  47 year old male comes in today complaining of 2 months of right foot pain. He states his pain began acutely when he "lost his arch". He is now having pain along the arch of his foot. He was initially having some pain in his heel but that has improved. Pain is worse with standing. Improves some at rest. He does have a history of uncontrolled diabetes but he denies any numbness or tingling into his foot. He is not noticed any swelling.  Past medical history and current medications are reviewed. No known drug allergies Review of the social history shows him to smoke half a pack of cigarettes a day He works in [employer] at [place]     Objective:   Physical Exam Developed, obese. No acute distress.  Right foot: There is some tenderness to palpation at the calcaneal insertion of the plantar fascia but most of his pain is along the midsubstance of the plantar fascia. No pain with calcaneal squeeze. Significant callus buildup both at the heel as well as along the metatarsal heads. He has a fairly well-preserved longitudinal arch with standing and normal calcaneal inversion with standing on his tiptoes. Good dorsalis pedis and posterior tibial pulses. Sensation is intact to light touch. Walking with an obvious limp.       Assessment & Plan:  1. Right heel/arch pain likely secondary to plantar fascial strain  Patient is requesting a cortisone injection. Injection is administered utilizing a medial approach after risks and benefits were explained. I'm going to give him a pair of well cushioned green sports insoles and have him followup in 4 weeks. A separate note, he is asking about the possibility of acquiring some diabetic shoes. I told him that I would be happy to fill out any paperwork that he may need to accomplish this  goal.  Consent obtained and verified. Time-out conducted. Noted no overlying erythema, induration, or other signs of local infection. Skin prepped in a sterile fashion. Topical analgesic spray: Ethyl chloride. Joint: right plantar fascia Needle: 25g 1.5 inch Completed without difficulty. Meds: 3cc 1% xylocaine, 1cc (40mg ) depomedrol  Advised to call if fevers/chills, erythema, induration, drainage, or persistent bleeding.

## 2013-05-03 ENCOUNTER — Ambulatory Visit: Payer: BC Managed Care – PPO | Admitting: Sports Medicine

## 2013-05-10 ENCOUNTER — Ambulatory Visit (INDEPENDENT_AMBULATORY_CARE_PROVIDER_SITE_OTHER): Payer: BC Managed Care – PPO | Admitting: Sports Medicine

## 2013-05-10 DIAGNOSIS — M79609 Pain in unspecified limb: Secondary | ICD-10-CM

## 2013-05-10 MED ORDER — TRAMADOL HCL 50 MG PO TABS: 50.0000 mg | ORAL_TABLET | Freq: Three times a day (TID) | ORAL | Status: DC | PRN

## 2013-05-10 NOTE — Progress Notes (Addendum)
Patient ID: Jerry Cantrell, male   DOB: 15-Jan-1966, 47 y.o.   MRN: [id number]   Scheduled pt an appointment with Dr. Rolley Sims 05/19/13 at 1:45 am at [facility].  Pt notified of appt info.

## 2013-05-10 NOTE — Progress Notes (Signed)
Patient ID: Jerry Cantrell, male   DOB: [birth year], 47 y.o.   MRN: [id number]  I spoke with the patient on the phone this morning regarding his chronic right foot/heel pain. He is still having significant discomfort despite a recent cortisone injection into his plantar fascia. At this point I would like to refer him to [facility] for further evaluation and treatment. In the meantime I will call him in some Ultram to take as needed for pain.

## 2013-05-11 ENCOUNTER — Ambulatory Visit (INDEPENDENT_AMBULATORY_CARE_PROVIDER_SITE_OTHER): Payer: BC Managed Care – PPO | Admitting: Internal Medicine

## 2013-05-11 ENCOUNTER — Encounter: Payer: Self-pay | Admitting: Internal Medicine

## 2013-05-11 VITALS — BP 162/98 | HR 72 | Temp 97.7°F | Ht 72.0 in | Wt 308.7 lb

## 2013-05-11 DIAGNOSIS — H5789 Other specified disorders of eye and adnexa: Secondary | ICD-10-CM

## 2013-05-11 DIAGNOSIS — I10 Essential (primary) hypertension: Secondary | ICD-10-CM

## 2013-05-11 DIAGNOSIS — M722 Plantar fascial fibromatosis: Secondary | ICD-10-CM

## 2013-05-11 DIAGNOSIS — Z794 Long term (current) use of insulin: Secondary | ICD-10-CM

## 2013-05-11 DIAGNOSIS — E119 Type 2 diabetes mellitus without complications: Secondary | ICD-10-CM

## 2013-05-11 DIAGNOSIS — K649 Unspecified hemorrhoids: Secondary | ICD-10-CM

## 2013-05-11 DIAGNOSIS — Z23 Encounter for immunization: Secondary | ICD-10-CM

## 2013-05-11 DIAGNOSIS — Z0000 Encounter for general adult medical examination without abnormal findings: Secondary | ICD-10-CM

## 2013-05-11 DIAGNOSIS — H579 Unspecified disorder of eye and adnexa: Secondary | ICD-10-CM

## 2013-05-11 DIAGNOSIS — E785 Hyperlipidemia, unspecified: Secondary | ICD-10-CM

## 2013-05-11 LAB — LIPID PANEL
LDL Cholesterol: 100 mg/dL — ABNORMAL HIGH (ref 0–99)
Triglycerides: 400 mg/dL — ABNORMAL HIGH (ref <150)
VLDL: 80 mg/dL — ABNORMAL HIGH (ref 0–40)

## 2013-05-11 LAB — GLUCOSE, CAPILLARY: Glucose-Capillary: 233 mg/dL — ABNORMAL HIGH (ref 70–99)

## 2013-05-11 MED ORDER — IBUPROFEN 200 MG PO TABS: 200.0000 mg | ORAL_TABLET | Freq: Four times a day (QID) | ORAL | Status: DC | PRN

## 2013-05-11 NOTE — Assessment & Plan Note (Signed)
Received flu shot today. 

## 2013-05-11 NOTE — Assessment & Plan Note (Signed)
Patient states he has had some recent hemorrhoidal bleeding. He denies any abdominal pain, nausea, vomiting, melena, or hematochezia. He says there is some blood on the toilet paper when he wipes. He denies any presence of frank blood in the toilet bowl. Also denies any issues with constipation. No intervention at this time.

## 2013-05-11 NOTE — Assessment & Plan Note (Addendum)
Patient now using NPH insulin 35 units bid + regular insulin SS. His blood sugars cover a very wide range of values, from 300's to 60's. Patient was encouraged to start diet modification and carb counting at his last visit, but has not started yet.  -No changes made to insulin regimen d/t variation in glucometer readings. Encouraged to begin carb counting and diet modification  -Patient advised to return in 4 weeks to discuss insulin regimen with PCP.  -Also scheduled to visit diabetes eye specialist later on this month.

## 2013-05-11 NOTE — Assessment & Plan Note (Signed)
Saw eye doctor last week for left eye irritation.  -Provided him with Abx eye drops + oral doxy. Says it is less irritated than before.  -He is also scheduled to see a diabetes eye specialist later this month.  -No sight from right eye, not a new issue.

## 2013-05-11 NOTE — Assessment & Plan Note (Signed)
Saw Sports Medicine on 04/28/13 and was given a cortisone injection at that time for plantar fasciitis. Patient reported no relief after a weeks time. Sports Medicine discussed with patient, gave Rx for Tramadol, and made an appointment for patient at [facility]. Patient is having issues with scheduling this appointment but is trying to get it scheduled in the next 2 weeks.  -Today complains of same issue. Exam reveals tenderness over plantar surface of foot from heel to ball of the foot. No erythema, swelling, or signs of infection noted on exam.  -Patient claims Tramadol he took last night was ineffective. Research on plantar fasciitis shows that NSAIDS, ice, rest, specific exercises, and taping of the foot is the most effective treatment. Advised patient to take Ibuprofen and provided info for taping techniques and exercises to do at home.  -Patient will hopefully see [facility] later this month.

## 2013-05-11 NOTE — Assessment & Plan Note (Signed)
Patient presents with elevated BP today, 162/98, however, he said he had only taken his BP meds 30 minutes prior to his clinic visit. Takes Norvasc, Coreg, Lasix, Lisinopril, and Terazosin.  -No changes made today as patient only recently took his meds.

## 2013-05-11 NOTE — Assessment & Plan Note (Signed)
Taking Lovastatin 20 mg. Last lipid panel was 1 year ago. Will repeat lipids today.

## 2013-05-11 NOTE — Progress Notes (Signed)
Patient ID: Jerry Cantrell, male   DOB: 1965/12/08, 47 y.o.   MRN: [id number]   Subjective:   Patient ID: Jerry Cantrell male   DOB: April 21, 1966 47 y.o.   MRN: [id number]  HPI: [name] is a 47 y.o. M w/ PMHx of DM type II, HTN, HLD, presents to the clinic today for follow up. Patient has been having problems with right foot pain. Saw Sports Medicine on 04/28/13 and was given a cortisone injection at that time for plantar fasciitis. Patient reported no relief after a weeks time. Sports Medicine discussed with patient, gave Rx for Tramadol, and made an appointment for patient at [facility]. Patient is having issues with scheduling this appointment but is trying to get it scheduled in the next 2 weeks.  Today, he presents to clinic for the same issue. Patient says his right foot is so painful, it is causing him pain at work as he is on his feet all day. [name] works in [employer] at [employer] He says he goes home and soaks his foot and wraps it with an ACE bandage, but nothing has seemed to help. He filled his Rx of Tramadol provided by sports medicine yesterday and tried it last night before going to bed, but claims it did not help. He says after a long day, the foot will swell a bit, but he has not noticed any redness, increased warmth, or signs of infection. No involvement of leg at this time.   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 He went to an eye doctor recently who provided him with some antibiotic eye drops and oral doxy and the patient claims his left eye is improving. No conjunctivitis noted on exam today. He is scheduled to see a diabetic eye specialist later this month.  Otherwise, the patient denies any other further issues, except for hemorrhoidal bleeding, which is an ongoing issue for him. He denies abdominal pain, nausea, vomiting, melena, dizziness, or lightheadedness, he  only sees some blood on the toilet paper, not mixed in with the stool. He has never seen frank blood in the toilet.   Past Medical History  Diagnosis Date  . Diabetes mellitus   . Hypertension   . Hyperlipidemia   . Meralgia paraesthetica 05/03/2012  . Lumbar spondylosis 05/03/2012  . Lumbar spinal stenosis 05/03/2012    Mild with only right L4 nerve root encroachment, no neurogenic claudication   . 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 Urinary incontinence 05/12/2012    Since the start of Sept. 2013   . Hemorrhoids 05/12/2012  . UTI (lower urinary tract infection)   . Meralgia paraesthetica 05/03/2012    On Lyrica which does improve pain.   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 Substance abuse   . Hyperlipidemia   . Pneumonia    Current Outpatient Prescriptions  Medication Sig Dispense Refill  . amitriptyline (ELAVIL) 50 MG tablet Take 50 mg by mouth at bedtime.      Marland Kitchen amLODipine (NORVASC) 10 MG tablet Take 1 tablet (10 mg total) by mouth daily.  30 tablet  0  . aspirin 81 MG chewable tablet Chew 1 tablet (81 mg total) by mouth daily.  30 tablet  11  . carvedilol (COREG) 25 MG tablet Take 1 tablet (25 mg total) by mouth 2 (two) times daily with a meal.  60 tablet  0  .  furosemide (LASIX) 20 MG tablet Take 1 tablet (20 mg total) by mouth daily.  30 tablet  0  . glucose blood (ONE TOUCH ULTRA TEST) test strip Check your CBG with your meter 3 times per day and as needed for evaluation of low blood sugar.  150 each  12  . insulin NPH (NOVOLIN N RELION) 100 UNIT/ML injection Inject 35 Units into the skin 2 (two) times daily.  20 mL  12  . insulin regular (NOVOLIN R,HUMULIN R) 100 units/mL injection Inject subcutaneous twice per day with your 2 largest meals, based on sliding scale provided by your doctor (1-18 units)  30 mL  3  . Insulin Syringes, Disposable, U-100 0.5 ML MISC For use with insulin  100 each  11  . lisinopril (PRINIVIL,ZESTRIL) 40 MG tablet Take 1 tablet (40 mg total)  by mouth daily.  30 tablet  11  . lovastatin (MEVACOR) 20 MG tablet Take 2 tablets (40 mg total) by mouth daily.  60 tablet  11  . omega-3 acid ethyl esters (LOVAZA) 1 G capsule Take 2 capsules (2 g total) by mouth 2 (two) times daily.  60 capsule  11  . pregabalin (LYRICA) 100 MG capsule Take 1 capsule (100 mg total) by mouth 2 (two) times daily.  60 capsule  0  . tadalafil (CIALIS) 10 MG tablet Take 1 tablet (10 mg total) by mouth as needed for erectile dysfunction.  10 tablet  1  . terazosin (HYTRIN) 1 MG capsule Take 1 capsule (1 mg total) by mouth at bedtime.  30 capsule  11  . traMADol (ULTRAM) 50 MG tablet Take 1 tablet (50 mg total) by mouth every 8 (eight) hours as needed for pain.  40 tablet  0   No current facility-administered medications for this visit.   Family History  Problem Relation Age of Onset  . Cancer Father     Colon cancer- in remission  . Alcohol abuse Father    History   Social History  . Marital Status: Single    Spouse Name: N/A    Number of Children: N/A  . Years of Education: N/A   Occupational History  . dietary services [employer]   Social History Main Topics  . Smoking status: Current Every Day Smoker -- 0.50 packs/day    Types: Cigarettes  . Smokeless tobacco: Never Used     Comment: "Trying - hard to stop"  . Alcohol Use: 7.2 oz/week    12 Cans of beer per week     Comment: occasionally  . Drug Use: No  . Sexual Activity: None   Other Topics Concern  . None   Social History Narrative  . None   Review of Systems: General: Denies fever, chills, diaphoresis, appetite change and fatigue.  HEENT: Positive for left eye pain/redness. 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 Denies hearing loss, congestion, sore throat, rhinorrhea, sneezing.   Respiratory: Denies SOB, DOE, cough, chest tightness, and wheezing.   Cardiovascular: Denies chest pain, palpitations and leg swelling.  Gastrointestinal: Positive for minor blood on toilet paper. Denies nausea,  vomiting, abdominal pain, diarrhea, constipation, and abdominal distention.  Genitourinary: Denies dysuria, urgency, frequency, hematuria, flank pain and difficulty urinating.  Musculoskeletal: Positive for right foot pain, and chronic b/l leg pain. Denies back pain, joint swelling, arthralgias and gait problem.  Skin: Denies pallor, rash and wounds.  Neurological: Denies dizziness, seizures, syncope, weakness, lightheadedness, numbness and headaches.  Psychiatric/Behavioral: Denies mood changes, confusion, nervousness, sleep disturbance and agitation.  Objective:  Physical Exam: Filed Vitals:   05/11/13 0959  BP: 162/98  Pulse: 72  Temp: 97.7 F (36.5 C)  TempSrc: Oral  Weight: 308 lb 11.2 oz (140.025 kg)  SpO2: 95%   General: Vital signs reviewed.  Patient is a well-developed and well-nourished, in no acute distress and cooperative with exam. Alert and oriented x3.  Head: Normocephalic and atraumatic. Nose: No erythema or drainage noted.  Turbinates normal. Eyes: PERRL, EOMI, conjunctivae normal, No scleral icterus.  Neck: Supple, trachea midline, normal ROM, No JVD, masses, thyromegaly, or carotid bruit present.  Cardiovascular: RRR, S1 normal, S2 normal, no murmurs, gallops, or rubs. Pulmonary/Chest: Normal respiratory effort, CTAB, no wheezes, rales, or rhonchi. Abdominal: Soft. Non-tender, non-distended, bowel sounds are normal, no masses, organomegaly, or guarding present.  Extremities: No swelling or edema,  pulses symmetric and intact bilaterally. No cyanosis or clubbing. Right foot tender to palpation, on plantar surface of foot extending from heel to ball of the foot. No obvious erythema, swelling, increased temp, or signs of infection. Neurological: A&O x3, Strength is normal and symmetric bilaterally, cranial nerve II-XII are grossly intact, no focal motor deficit, sensory intact to light touch bilaterally.  Skin: Warm, dry and intact. No rashes or erythema. Psychiatric:  Normal mood and affect. speech and behavior is normal. Cognition and memory are normal.   Assessment & Plan:   Please see problem-based assessment and plan.

## 2013-05-11 NOTE — Patient Instructions (Signed)
1. Please make a follow up appointment for ~4 weeks w/ Dr. Eulas Post 2. Please take all medications as prescribed. Continue to keep track of your blood sugar. Read about treatment for plantar fasciitis. 3. If you have worsening of your symptoms or new symptoms arise, please call the clinic [phone]), or go to the ER immediately if symptoms are severe.

## 2013-05-15 NOTE — Progress Notes (Signed)
I saw and evaluated the patient.  I personally confirmed the key portions of the history and exam documented by Dr. Jones and I reviewed pertinent patient test results.  The assessment, diagnosis, and plan were formulated together and I agree with the documentation in the resident's note.   

## 2013-05-31 ENCOUNTER — Other Ambulatory Visit: Payer: Self-pay | Admitting: Sports Medicine

## 2013-05-31 ENCOUNTER — Other Ambulatory Visit: Payer: Self-pay | Admitting: Internal Medicine

## 2013-06-07 ENCOUNTER — Other Ambulatory Visit: Payer: Self-pay | Admitting: *Deleted

## 2013-06-07 DIAGNOSIS — I10 Essential (primary) hypertension: Secondary | ICD-10-CM

## 2013-06-08 ENCOUNTER — Encounter: Payer: Self-pay | Admitting: Internal Medicine

## 2013-06-08 DIAGNOSIS — E113599 Type 2 diabetes mellitus with proliferative diabetic retinopathy without macular edema, unspecified eye: Secondary | ICD-10-CM | POA: Insufficient documentation

## 2013-06-08 DIAGNOSIS — H01003 Unspecified blepharitis right eye, unspecified eyelid: Secondary | ICD-10-CM | POA: Insufficient documentation

## 2013-06-08 DIAGNOSIS — E1136 Type 2 diabetes mellitus with diabetic cataract: Secondary | ICD-10-CM

## 2013-06-08 DIAGNOSIS — E11311 Type 2 diabetes mellitus with unspecified diabetic retinopathy with macular edema: Secondary | ICD-10-CM | POA: Insufficient documentation

## 2013-06-08 DIAGNOSIS — H00029 Hordeolum internum unspecified eye, unspecified eyelid: Secondary | ICD-10-CM | POA: Insufficient documentation

## 2013-06-08 DIAGNOSIS — H01006 Unspecified blepharitis left eye, unspecified eyelid: Secondary | ICD-10-CM

## 2013-06-08 MED ORDER — AMLODIPINE BESYLATE 10 MG PO TABS: 10.0000 mg | ORAL_TABLET | Freq: Every day | ORAL | Status: DC

## 2013-06-15 ENCOUNTER — Other Ambulatory Visit: Payer: Self-pay | Admitting: *Deleted

## 2013-06-15 ENCOUNTER — Telehealth: Payer: Self-pay | Admitting: *Deleted

## 2013-06-15 DIAGNOSIS — R0600 Dyspnea, unspecified: Secondary | ICD-10-CM

## 2013-06-15 NOTE — Telephone Encounter (Signed)
Pt called with c/o cough and cold symptoms for a week, past three days cold sx resolved but he still has cough.  He has Hx of pneumonia and this concerns him. No fever at this time. Pt offered appointment today but he is tired and wants to wait until tomorrow.  Will see tomorrow at 10:45. Pt instructed to go to ED if he becomes SOB or other problems. He voices understanding.

## 2013-06-16 ENCOUNTER — Ambulatory Visit (INDEPENDENT_AMBULATORY_CARE_PROVIDER_SITE_OTHER): Payer: BC Managed Care – PPO | Admitting: Internal Medicine

## 2013-06-16 ENCOUNTER — Ambulatory Visit (HOSPITAL_COMMUNITY)
Admission: RE | Admit: 2013-06-16 | Discharge: 2013-06-16 | Disposition: A | Discharge status: Home / Self Care | Payer: BC Managed Care – PPO | Source: Ambulatory Visit | Attending: Internal Medicine | Admitting: Internal Medicine

## 2013-06-16 ENCOUNTER — Encounter: Payer: Self-pay | Admitting: Internal Medicine

## 2013-06-16 VITALS — BP 146/89 | HR 69 | Temp 98.7°F | Ht 73.0 in | Wt 305.6 lb

## 2013-06-16 DIAGNOSIS — E1142 Type 2 diabetes mellitus with diabetic polyneuropathy: Secondary | ICD-10-CM

## 2013-06-16 DIAGNOSIS — R05 Cough: Secondary | ICD-10-CM | POA: Insufficient documentation

## 2013-06-16 DIAGNOSIS — IMO0002 Reserved for concepts with insufficient information to code with codable children: Secondary | ICD-10-CM

## 2013-06-16 DIAGNOSIS — F32A Depression, unspecified: Secondary | ICD-10-CM

## 2013-06-16 DIAGNOSIS — E1165 Type 2 diabetes mellitus with hyperglycemia: Secondary | ICD-10-CM

## 2013-06-16 DIAGNOSIS — E11311 Type 2 diabetes mellitus with unspecified diabetic retinopathy with macular edema: Secondary | ICD-10-CM

## 2013-06-16 DIAGNOSIS — F339 Major depressive disorder, recurrent, unspecified: Secondary | ICD-10-CM | POA: Insufficient documentation

## 2013-06-16 DIAGNOSIS — J209 Acute bronchitis, unspecified: Secondary | ICD-10-CM

## 2013-06-16 DIAGNOSIS — E119 Type 2 diabetes mellitus without complications: Secondary | ICD-10-CM

## 2013-06-16 DIAGNOSIS — I10 Essential (primary) hypertension: Secondary | ICD-10-CM

## 2013-06-16 DIAGNOSIS — F329 Major depressive disorder, single episode, unspecified: Secondary | ICD-10-CM

## 2013-06-16 DIAGNOSIS — R059 Cough, unspecified: Secondary | ICD-10-CM | POA: Insufficient documentation

## 2013-06-16 DIAGNOSIS — I517 Cardiomegaly: Secondary | ICD-10-CM | POA: Insufficient documentation

## 2013-06-16 DIAGNOSIS — Z0000 Encounter for general adult medical examination without abnormal findings: Secondary | ICD-10-CM

## 2013-06-16 DIAGNOSIS — Z23 Encounter for immunization: Secondary | ICD-10-CM

## 2013-06-16 DIAGNOSIS — R0602 Shortness of breath: Secondary | ICD-10-CM | POA: Insufficient documentation

## 2013-06-16 DIAGNOSIS — E785 Hyperlipidemia, unspecified: Secondary | ICD-10-CM

## 2013-06-16 DIAGNOSIS — R6883 Chills (without fever): Secondary | ICD-10-CM | POA: Insufficient documentation

## 2013-06-16 DIAGNOSIS — Z794 Long term (current) use of insulin: Secondary | ICD-10-CM

## 2013-06-16 DIAGNOSIS — R0609 Other forms of dyspnea: Secondary | ICD-10-CM

## 2013-06-16 DIAGNOSIS — R0600 Dyspnea, unspecified: Secondary | ICD-10-CM

## 2013-06-16 MED ORDER — PNEUMOCOCCAL VAC POLYVALENT 25 MCG/0.5ML IJ INJ: 0.5000 mL | INJECTION | Freq: Once | INTRAMUSCULAR | Status: DC

## 2013-06-16 MED ORDER — ALBUTEROL SULFATE HFA 108 (90 BASE) MCG/ACT IN AERS: 2.0000 | INHALATION_SPRAY | RESPIRATORY_TRACT | Status: DC | PRN

## 2013-06-16 MED ORDER — AMOXICILLIN-POT CLAVULANATE 875-125 MG PO TABS: 1.0000 | ORAL_TABLET | Freq: Two times a day (BID) | ORAL | Status: AC

## 2013-06-16 MED ORDER — PREGABALIN 100 MG PO CAPS: 100.0000 mg | ORAL_CAPSULE | Freq: Two times a day (BID) | ORAL | Status: DC

## 2013-06-16 MED ORDER — INSULIN NPH (HUMAN) (ISOPHANE) 100 UNIT/ML ~~LOC~~ SUSP: 35.0000 [IU] | Freq: Two times a day (BID) | SUBCUTANEOUS | Status: DC

## 2013-06-16 MED ORDER — DM-GUAIFENESIN ER 30-600 MG PO TB12: 1.0000 | ORAL_TABLET | Freq: Two times a day (BID) | ORAL | Status: DC

## 2013-06-16 NOTE — Assessment & Plan Note (Signed)
Patient is a chronic smoker, but is down to smoking one pack per week. Chest x-ray was obtained this clinic visit, which showed bronchitis. Will treat for bronchitis.

## 2013-06-16 NOTE — Assessment & Plan Note (Deleted)
Lab Results  Component Value Date   HGBA1C 7.6 04/12/2013   HGBA1C 7.4 01/17/2013   HGBA1C 8.5 08/18/2012     Assessment: Diabetes control: fair control Progress toward A1C goal:  unchanged Comments: Pt endorses infrequent use of his NovoLog and coverage insulin; however he states he is taking his Novolin R as prescribed. His blood sugars are still elevated rarely in 2-400 range he had one episode of hypoglycemia to 55 on 06/02/13, when he hadn't eaten.  Plan: Medications:  continue current medications Home glucose monitoring: Frequency: 2 times a day Timing: before meals Instruction/counseling given: reminded to bring blood glucose meter & log to each visit, reminded to bring medications to each visit and discussed diet Educational resources provided: brochure;handout Self management tools provided: copy of home glucose meter download Other plans: He was reminded to eat regularly scheduled meals and to use his Novolin N with these meals. He's to followup with me in clinic within one month so I can further evaluate his blood sugars.

## 2013-06-16 NOTE — Assessment & Plan Note (Addendum)
LDL from 05/01/13 is 100. Will need to check a direct LDL at his next clinic visit, as his triglycerides from 05/11/13 or elevated at 400, up from previous check on 05/12/12 126.

## 2013-06-16 NOTE — Assessment & Plan Note (Addendum)
Patient with upper respiratory symptoms consisting of cough and shortness of breath since 06/07/13. He is concerned that he's going to get an ammonia, because this is how his previous episode of pneumonia presented, requiring him to be admitted to the hospital. A chest x-ray was obtained today, and showed complete resolution of left lower lobe airspace disease compared to previous CXR, mild cardiomegaly with mild cephalization of the pulmonary vasculature, but no frank pulmonary edema. It did also show bronchitis. Will treat for acute bronchitis - Augmentin 875mg  po BID x 10days - Continue Albuterol inhaler 2p q 4-6h PRN - Continue Mucinex DM BID PRN

## 2013-06-16 NOTE — Patient Instructions (Signed)
**  I am sending you for a chest x-ray please go get this today. I will call you with the results of the chest x-ray. And refilling your albuterol inhaler as well. Continue to take the Mucinex DM. **Be sure to take your insulin as prescribed, and eat regular meals.

## 2013-06-16 NOTE — Progress Notes (Addendum)
Patient ID: Jerry Cantrell, male   DOB: 07/05/1966, 47 y.o.   MRN: [id number]  Subjective:   Patient ID: Jerry Cantrell male   DOB: November 26, 1965 47 y.o.   MRN: [id number]  HPI: Jerry Cantrell is a 47 y.o. M with PMH uncontrolled HTN and DM2, who is a current smoker presents to the clinic for a follow up appt for his DM2, HTN, and today c/o a cough.  Pt saw Dr. Zadie Rhine with Opthalmology, and was told that his vision was very bad. Per pt, at the beginning of Sept he began seeing floater and "webs" in his vision. He was sent to Dr. Herbert Deaner, and Opthalmologic, and he was sent back to Dr. Zadie Rhine, the diabetic opthalmologic specialist, where the pt was scheduled for laser surgery at the end of Sept. Unfortunately within one week of the surgery, the pt could feel the veins popping in his eyes, and he began having vision occulusion. The pt states that he had to undergo emergent surgery on his eyes on 06/05/13 where the blood was removed and the veins were "lasered out". The pt was then told that he will lose his vision and that this procedure will only prolong his ability to see.   He presents today c/o of a URI with cough that began 10/8. He was told to take it easy by his opthalmologic, so he stayed in bed trying to relax to prevent coughing. He doesn't think he had fevers, but does endorse some chills. He feels like his breathing is worse, and he is concerned b/c this is how he felt before his last hospitalization when he had PNA.  Patient endorsed to my nurse thinks of depression, and he had discussed with his long-time girlfriend and their children regarding ending his life. This was not endorsed to me. However when I questioned him about it, he states that he is feeling fine today, but with all his recent medical issues, he states that a thought of "ending it" has crossed his mind. He denies any current suicidal ideations or thoughts of harm to himself/others. He states he would be interested speaking with a mental  health professional/counselor.   Past Medical History  Diagnosis Date  . Diabetes mellitus   . Hypertension   . Hyperlipidemia   . Meralgia paraesthetica 05/03/2012  . Lumbar spondylosis 05/03/2012  . Lumbar spinal stenosis 05/03/2012    Mild with only right L4 nerve root encroachment, no neurogenic claudication   . 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 Urinary incontinence 05/12/2012    Since the start of Sept. 2013   . Hemorrhoids 05/12/2012  . UTI (lower urinary tract infection)   . Meralgia paraesthetica 05/03/2012    On Lyrica which does improve pain.   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 Substance abuse   . Hyperlipidemia   . Pneumonia    Current Outpatient Prescriptions  Medication Sig Dispense Refill  . amitriptyline (ELAVIL) 50 MG tablet Take 50 mg by mouth at bedtime.      Marland Kitchen amLODipine (NORVASC) 10 MG tablet Take 1 tablet (10 mg total) by mouth daily.  90 tablet  4  . aspirin 81 MG chewable tablet Chew 1 tablet (81 mg total) by mouth daily.  30 tablet  11  . carvedilol (COREG) 25 MG tablet TAKE ONE TABLET BY MOUTH TWICE DAILY WITH MEALS  60 tablet  6  . furosemide (LASIX) 20 MG tablet Take 1 tablet (20 mg total) by mouth daily.  30 tablet  0  .  glucose blood (ONE TOUCH ULTRA TEST) test strip Check your CBG with your meter 3 times per day and as needed for evaluation of low blood sugar.  150 each  12  . insulin NPH (NOVOLIN N RELION) 100 UNIT/ML injection Inject 35 Units into the skin 2 (two) times daily.  30 mL  12  . insulin regular (NOVOLIN R,HUMULIN R) 100 units/mL injection Inject subcutaneous twice per day with your 2 largest meals, based on sliding scale provided by your doctor (1-18 units)  30 mL  3  . lisinopril (PRINIVIL,ZESTRIL) 40 MG tablet Take 1 tablet (40 mg total) by mouth daily.  30 tablet  11  . lovastatin (MEVACOR) 20 MG tablet Take 2 tablets (40 mg total) by mouth daily.  60 tablet  11  . omega-3 acid ethyl esters (LOVAZA) 1 G capsule Take 2 capsules  (2 g total) by mouth 2 (two) times daily.  60 capsule  11  . pregabalin (LYRICA) 100 MG capsule Take 1 capsule (100 mg total) by mouth 2 (two) times daily.  60 capsule  6  . tadalafil (CIALIS) 10 MG tablet Take 1 tablet (10 mg total) by mouth as needed for erectile dysfunction.  10 tablet  1  . terazosin (HYTRIN) 1 MG capsule Take 1 capsule (1 mg total) by mouth at bedtime.  30 capsule  11  . traMADol (ULTRAM) 50 MG tablet TAKE ONE TABLET BY MOUTH EVERY 8 HOURS AS NEEDED FOR PAIN  40 tablet  0  . albuterol (PROVENTIL HFA;VENTOLIN HFA) 108 (90 BASE) MCG/ACT inhaler Inhale 2 puffs into the lungs every 4 (four) hours as needed for wheezing or shortness of breath.  1 Inhaler  6  . dextromethorphan-guaiFENesin (MUCINEX DM) 30-600 MG per 12 hr tablet Take 1 tablet by mouth every 12 (twelve) hours.  60 tablet  0  . ibuprofen (ADVIL) 200 MG tablet Take 1 tablet (200 mg total) by mouth every 6 (six) hours as needed for pain.  100 tablet  0  . Insulin Syringes, Disposable, U-100 0.5 ML MISC For use with insulin  100 each  11   Current Facility-Administered Medications  Medication Dose Route Frequency Provider Last Rate Last Dose  . pneumococcal 23 valent vaccine (PNU-IMMUNE) injection 0.5 mL  0.5 mL Intramuscular Once Otho Bellows, MD       Family History  Problem Relation Age of Onset  . Cancer Father     Colon cancer- in remission  . Alcohol abuse Father    History   Social History  . Marital Status: Single    Spouse Name: N/A    Number of Children: N/A  . Years of Education: N/A   Occupational History  . dietary services [employer]   Social History Main Topics  . Smoking status: Current Every Day Smoker -- 0.30 packs/day    Types: Cigarettes  . Smokeless tobacco: Never Used     Comment: "Trying - hard to stop"  . Alcohol Use: 7.2 oz/week    12 Cans of beer per week     Comment: occasionally  . Drug Use: No  . Sexual Activity: None   Other Topics Concern  . None   Social  History Narrative  . None   Review of Systems: A 10 point ROS was performed; pertinent positives and negatives were noted in the HPI   Objective:  Physical Exam: Filed Vitals:   06/16/13 1150 06/16/13 1151  BP: 146/89   Pulse: 69   Temp: 98.7  F (37.1 C)   TempSrc: Oral   Height: 6\' 1"  (1.854 m)   Weight:  305 lb 9.6 oz (138.619 kg)  SpO2: 92%    Constitutional: Vital signs reviewed.  Patient is a well-developed and well-nourished male in no acute distress and cooperative with exam. Alert and oriented x3.  Head: Normocephalic and atraumatic Mouth: no erythema or exudates, MMM Eyes: PERRL, EOMI, left eye with injected sclera. No scleral icterus.  Neck: Supple, Trachea midline normal ROM Cardiovascular: RRR, S1 normal, S2 normal, no MRG, pulses symmetric and intact bilaterally Pulmonary/Chest: normal respiratory effort, diffuse rhonchi, dry cough Abdominal: Soft. Non-tender, non-distended, bowel sounds are normal, no masses, organomegaly, or guarding present.  Musculoskeletal: No joint deformities, erythema, or edema Neurological: A&O x3, strength intact in BU&LE Skin: Warm, dry and intact. No rash, cyanosis, or clubbing.  Psychiatric: Normal mood and affect for the majority of the exam. He does become tearful when asked about his depression and SI- denies current SI or thoughts of harm to himself or others  Assessment & Plan:   Please refer to Problem List based Assessment and Plan

## 2013-06-16 NOTE — Assessment & Plan Note (Signed)
Currently Jerry Cantrell is taking amitriptyline. Despite this, he endorses some recent depression especially with the worsening of his vision, his chronic pain, and financial difficulties. While he does endorse that he has thought about ending his life, he denies having any plan, and states that currently he denies any suicidal ideation or thoughts of harm to himself. He denies having seen a mental health professional the past, but states that he has been seen one now. I'm hesitant to change around his antidepressant medication at this time, but have put in a referral for him to be seen a behavioral health, and for our social worker to contact him. I do not feel that he is suicidal at this time, and I feel he is safe to go home.

## 2013-06-16 NOTE — Assessment & Plan Note (Addendum)
Lab Results  Component Value Date   HGBA1C 7.6 04/12/2013   HGBA1C 7.4 01/17/2013   HGBA1C 8.5 08/18/2012     Assessment: Diabetes control: fair control Progress toward A1C goal:  unchanged Comments: Pt endorses infrequent use of his NovoLog and coverage insulin; however he states he is taking his Novolin R as prescribed. His blood sugars are still elevated rarely in 2-400 range he had one episode of hypoglycemia to 55 on 06/02/13, when he hadn't eaten.  Plan: Medications:  continue current medications Home glucose monitoring: Frequency: 2 times a day Timing: before meals Instruction/counseling given: reminded to bring blood glucose meter & log to each visit, reminded to bring medications to each visit and discussed diet Educational resources provided: brochure;handout Self management tools provided: copy of home glucose meter download Other plans: He was reminded to eat regularly scheduled meals and to use his Novolin N with these meals. He's to followup with me in clinic within one month so I can further evaluate his blood sugars. - Checking urine microalbumin/creatinine

## 2013-06-16 NOTE — Assessment & Plan Note (Addendum)
BP Readings from Last 3 Encounters:  06/16/13 146/89  05/11/13 162/98  04/27/13 178/107    Lab Results  Component Value Date   NA 141 02/02/2013   K 4.5 02/02/2013   CREATININE 1.26 02/02/2013    Assessment: Blood pressure control: moderately elevated Progress toward BP goal:  Improved from last visit.  Plan: Medications:  continue current medications Educational resources provided: brochure;handout;video Self management tools provided:   Other plans: F/u in 1 month

## 2013-06-17 LAB — MICROALBUMIN / CREATININE URINE RATIO
Creatinine, Urine: 48.2 mg/dL
Microalb Creat Ratio: 1926.1 mg/g — ABNORMAL HIGH (ref 0.0–30.0)
Microalb, Ur: 92.84 mg/dL — ABNORMAL HIGH (ref 0.00–1.89)

## 2013-06-19 ENCOUNTER — Telehealth: Payer: Self-pay | Admitting: Licensed Clinical Social Worker

## 2013-06-19 ENCOUNTER — Encounter: Payer: Self-pay | Admitting: Internal Medicine

## 2013-06-19 NOTE — Telephone Encounter (Signed)
Jerry Cantrell was referred to CSW for mental health issues and referral to psychiatry.  CSW obtained listing of in-network psychiatrist for Mr. Nelles and will obtain pt's choice.  CSW placed call to Mr. Doucet, no answer and no voice mail options at this time.

## 2013-06-21 NOTE — Telephone Encounter (Signed)
CSW placed call to Jerry Cantrell.  Phone answered and person states best time to reach Jerry Cantrell is 9:30pm.  Possibility pt works second shift.  CSW will attempt earlier time of the day to reach pt.

## 2013-06-26 NOTE — Progress Notes (Signed)
Case discussed with Dr. Glenn at the time of the visit.  We reviewed the resident's history and exam and pertinent patient test results.  I agree with the assessment, diagnosis, and plan of care documented in the resident's note.   

## 2013-06-30 ENCOUNTER — Encounter: Payer: Self-pay | Admitting: Internal Medicine

## 2013-06-30 ENCOUNTER — Encounter: Payer: Self-pay | Admitting: Licensed Clinical Social Worker

## 2013-06-30 ENCOUNTER — Ambulatory Visit (INDEPENDENT_AMBULATORY_CARE_PROVIDER_SITE_OTHER): Payer: BC Managed Care – PPO | Admitting: Internal Medicine

## 2013-06-30 VITALS — BP 140/100 | HR 82 | Temp 99.4°F | Ht 73.0 in | Wt 304.5 lb

## 2013-06-30 DIAGNOSIS — F17200 Nicotine dependence, unspecified, uncomplicated: Secondary | ICD-10-CM

## 2013-06-30 DIAGNOSIS — E119 Type 2 diabetes mellitus without complications: Secondary | ICD-10-CM

## 2013-06-30 DIAGNOSIS — I10 Essential (primary) hypertension: Secondary | ICD-10-CM

## 2013-06-30 DIAGNOSIS — Z794 Long term (current) use of insulin: Secondary | ICD-10-CM

## 2013-06-30 DIAGNOSIS — F32A Depression, unspecified: Secondary | ICD-10-CM

## 2013-06-30 DIAGNOSIS — M545 Low back pain, unspecified: Secondary | ICD-10-CM

## 2013-06-30 DIAGNOSIS — R0602 Shortness of breath: Secondary | ICD-10-CM

## 2013-06-30 DIAGNOSIS — E1142 Type 2 diabetes mellitus with diabetic polyneuropathy: Secondary | ICD-10-CM

## 2013-06-30 DIAGNOSIS — G8929 Other chronic pain: Secondary | ICD-10-CM

## 2013-06-30 DIAGNOSIS — F329 Major depressive disorder, single episode, unspecified: Secondary | ICD-10-CM

## 2013-06-30 DIAGNOSIS — M48061 Spinal stenosis, lumbar region without neurogenic claudication: Secondary | ICD-10-CM

## 2013-06-30 DIAGNOSIS — Z720 Tobacco use: Secondary | ICD-10-CM

## 2013-06-30 LAB — BASIC METABOLIC PANEL WITH GFR
BUN: 16 mg/dL (ref 6–23)
CO2: 32 mEq/L (ref 19–32)
Chloride: 99 mEq/L (ref 96–112)
Creat: 1.2 mg/dL (ref 0.50–1.35)
Sodium: 140 mEq/L (ref 135–145)

## 2013-06-30 LAB — GLUCOSE, CAPILLARY: Glucose-Capillary: 494 mg/dL — ABNORMAL HIGH (ref 70–99)

## 2013-06-30 MED ORDER — TRAMADOL HCL 50 MG PO TABS: ORAL_TABLET | ORAL | Status: DC

## 2013-06-30 MED ORDER — TIOTROPIUM BROMIDE MONOHYDRATE 18 MCG IN CAPS: 18.0000 ug | ORAL_CAPSULE | Freq: Every day | RESPIRATORY_TRACT | Status: DC

## 2013-06-30 MED ORDER — FUROSEMIDE 20 MG PO TABS: 20.0000 mg | ORAL_TABLET | Freq: Every day | ORAL | Status: DC

## 2013-06-30 NOTE — Progress Notes (Signed)
Case discussed with Dr. Glenn at the time of the visit.  We reviewed the resident's history and exam and pertinent patient test results.  I agree with the assessment, diagnosis, and plan of care documented in the resident's note.   

## 2013-06-30 NOTE — Telephone Encounter (Signed)
See Social Work  06/30/13, no letter mailed.

## 2013-06-30 NOTE — Progress Notes (Signed)
Patient ID: Jerry Cantrell, male   DOB: [birth year], 47 y.o.   MRN: [id number]  Subjective:   Patient ID: Jerry Cantrell male   DOB: 1965-10-10 47 y.o.   MRN: [id number]  HPI: JerryJerry Cantrell is a 47 y.o. M w/ PMH uncontrolled HTN and DM2, who is a current smoker  presents to the clinic c/o of SOB and BLE edema  He was seen in the clinic on 10/17 with complaints of a URI and was treated with an abx. Pt states that he feels constantly SOB over the past month, despite being treated with the abx. She feels that the congestion has improved but he is having trouble getting the oxygen in his lungs, possibly like he is drowning. He SOB is worse with exertion but is present all the time. His albuterol inhaler is not improving his sx despite using it every 2-4 hours. He is also c/o of BLE edema. He is on Lasix 20mg  daily, but states that he has been taking an extra pill when he works, which seems to help the swelling.   Pt states that he was in the bed all day long yesterday and ate cinnamon toast last night . His CBG is >400 this morning. He did not bring his meter today but states that his sugars have been fluctuating, and are sometimes in the upper 100s-400s. Pt endorses taking the Novolin R regularly but is not taking the Novolin N as regularly. He continues to c/o of a burning pain in his feet that progressively worsens while standing.  Pt c/o of continued problems at home and continued depression. He states that he was in the bed yesterday b/c he felt depressed. He denies any current suicidal ideation. I have referred him to [facility]. Our clinical social worker has been trying to get in touch with Jerry Cantrell to provide him with a list of resources to help with depression, but she has been unable to get in touch with him.   Past Medical History  Diagnosis Date  . Diabetes mellitus   . Hypertension   . Hyperlipidemia   . Meralgia paraesthetica 05/03/2012  . Lumbar spondylosis 05/03/2012  . Lumbar  spinal stenosis 05/03/2012    Mild with only right L4 nerve root encroachment, no neurogenic claudication   . 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 Urinary incontinence 05/12/2012    Since the start of Sept. 2013   . Hemorrhoids 05/12/2012  . UTI (lower urinary tract infection)   . Meralgia paraesthetica 05/03/2012    On Lyrica which does improve pain.   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 Substance abuse   . Hyperlipidemia   . Pneumonia    Current Outpatient Prescriptions  Medication Sig Dispense Refill  . albuterol (PROVENTIL HFA;VENTOLIN HFA) 108 (90 BASE) MCG/ACT inhaler Inhale 2 puffs into the lungs every 4 (four) hours as needed for wheezing or shortness of breath.  1 Inhaler  6  . amitriptyline (ELAVIL) 50 MG tablet Take 50 mg by mouth at bedtime.      Marland Kitchen amLODipine (NORVASC) 10 MG tablet Take 1 tablet (10 mg total) by mouth daily.  90 tablet  4  . amoxicillin-clavulanate (AUGMENTIN) 875-125 MG per tablet Take 1 tablet by mouth 2 (two) times daily.  20 tablet  0  . aspirin 81 MG chewable tablet Chew 1 tablet (81 mg total) by mouth daily.  30 tablet  11  . carvedilol (COREG) 25 MG tablet TAKE ONE TABLET BY MOUTH TWICE DAILY WITH  MEALS  60 tablet  6  . dextromethorphan-guaiFENesin (MUCINEX DM) 30-600 MG per 12 hr tablet Take 1 tablet by mouth every 12 (twelve) hours.  60 tablet  0  . furosemide (LASIX) 20 MG tablet Take 1 tablet (20 mg total) by mouth daily.  30 tablet  0  . glucose blood (ONE TOUCH ULTRA TEST) test strip Check your CBG with your meter 3 times per day and as needed for evaluation of low blood sugar.  150 each  12  . ibuprofen (ADVIL) 200 MG tablet Take 1 tablet (200 mg total) by mouth every 6 (six) hours as needed for pain.  100 tablet  0  . insulin NPH (NOVOLIN N RELION) 100 UNIT/ML injection Inject 35 Units into the skin 2 (two) times daily.  30 mL  12  . insulin regular (NOVOLIN R,HUMULIN R) 100 units/mL injection Inject subcutaneous twice per day with your 2  largest meals, based on sliding scale provided by your doctor (1-18 units)  30 mL  3  . Insulin Syringes, Disposable, U-100 0.5 ML MISC For use with insulin  100 each  11  . lisinopril (PRINIVIL,ZESTRIL) 40 MG tablet Take 1 tablet (40 mg total) by mouth daily.  30 tablet  11  . lovastatin (MEVACOR) 20 MG tablet Take 2 tablets (40 mg total) by mouth daily.  60 tablet  11  . omega-3 acid ethyl esters (LOVAZA) 1 G capsule Take 2 capsules (2 g total) by mouth 2 (two) times daily.  60 capsule  11  . pregabalin (LYRICA) 100 MG capsule Take 1 capsule (100 mg total) by mouth 2 (two) times daily.  60 capsule  6  . tadalafil (CIALIS) 10 MG tablet Take 1 tablet (10 mg total) by mouth as needed for erectile dysfunction.  10 tablet  1  . terazosin (HYTRIN) 1 MG capsule Take 1 capsule (1 mg total) by mouth at bedtime.  30 capsule  11  . traMADol (ULTRAM) 50 MG tablet TAKE ONE TABLET BY MOUTH EVERY 8 HOURS AS NEEDED FOR PAIN  40 tablet  0   No current facility-administered medications for this visit.   Family History  Problem Relation Age of Onset  . Cancer Father     Colon cancer- in remission  . Alcohol abuse Father    History   Social History  . Marital Status: Single    Spouse Name: N/A    Number of Children: N/A  . Years of Education: N/A   Occupational History  . dietary services [employer]   Social History Main Topics  . Smoking status: Current Every Day Smoker -- 0.30 packs/day    Types: Cigarettes  . Smokeless tobacco: Never Used     Comment: "Trying - hard to stop"  . Alcohol Use: 7.2 oz/week    12 Cans of beer per week     Comment: occasionally  . Drug Use: No  . Sexual Activity: None   Other Topics Concern  . None   Social History Narrative  . None   Review of Systems: A 12 point ROS was performed; pertinent positives and negatives were noted in the HPI   Objective:  Physical Exam: Filed Vitals:   06/30/13 0934  BP: 147/101  Pulse: 92  Temp: 99.4 F (37.4 C)   TempSrc: Oral  Height: 6\' 1"  (1.854 m)  Weight: 304 lb 8 oz (138.12 kg)  SpO2: 92%   Constitutional: Vital signs reviewed.  Patient is a morbidly obese male in no  acute distress and cooperative with exam.  Head: Normocephalic and atraumatic Mouth: Poor dentition Eyes: PERRL, EOMI, conjunctivae normal, No scleral icterus.  Cardiovascular: RRR, no MRG. 2+ pitting BLE from ankle to the knee and Pulmonary/Chest: Normal respiratory effort, CTAB, no wheezes, rales, or rhonchi Abdominal: Obese. Non-tender, non-distended Musculoskeletal: No joint deformities, erythema, ROM full Neurological: A&O x3, Strength is normal and symmetric bilaterally, cranial nerve II-XII are grossly intact, no focal motor deficit, diminished sensation in the feet bilaterally Skin: Warm, dry and intact. No rash, cyanosis, or clubbing. Thickened skin at the heels, no cracks or areas of broken skin. No ulcerations. Overall feet look very good. Psychiatric: Depressed mood and mildly flattened affect.   Assessment & Plan:   Please refer to Problem List based Assessment and Plan

## 2013-06-30 NOTE — Assessment & Plan Note (Addendum)
He continues to have shortness of breath, is worse with exertion. History with antibiotics his last clinic visit on 10/17, which per him did help with his congestion, but his still short of breath. An Echo was done in June that was fairly normal, so this time I don't think this is heart is causing shortness of breath. He still smoking 8 cigarettes a day, and has not attempted to cut back. He's not had PFTs in the past because he was unable to afford them, but now he has [employer] which had pain for PFTs. We'll also start him on Spiriva, as he likely has COPD from his history of cigarette use. His O2 sat was 96% today. - PFTs - Spiriva one inhalation daily - Smoking cessation was discussed with the patient in depth - F/u w/in 1 week for reevaluation of his SOB

## 2013-06-30 NOTE — Assessment & Plan Note (Addendum)
Lab Results  Component Value Date   HGBA1C 7.6 04/12/2013   HGBA1C 7.4 01/17/2013   HGBA1C 8.5 08/18/2012     Assessment: Diabetes control: fair control Progress toward A1C goal:  unchanged Comments: He is not completely compliant with the Novolin N, but states he is taking the Novolin R.  Plan: Medications:  continue current medications Home glucose monitoring: Frequency:   Timing: before meals Instruction/counseling given: reminded to bring blood glucose meter & log to each visit, reminded to bring medications to each visit, discussed foot care, discussed the need for weight loss and discussed diet Educational resources provided:   Self management tools provided:   Other plans:  I had a detailed discussion with Mr. Tuy today. I told him that I cannot fix him alone, he has help himself as well. Told him that he needs to lose weight;  I discussed with him that I know he has pain in his feet, but despite that he meet needs to improve his diet, which he is not doing all. He eats whatever he wants to, and states he doesn't understand why his sugars are high, or why the neuropathy has worsen in his feet. I reiterated to him that if he wants to get better, he has to start working for it, by eating better, taking his insulin, checking his blood sugars, and exercising when he can. He denies to me that he is not doing much to help himself, and will really try hard to work on it.  His blood sugar were almost 500 today in the clinic, but this is not an abnormal value for him. He diaper his meter, so I am not changing any of his medications around. He's to follow up next week, and is to bring his meter.

## 2013-06-30 NOTE — Telephone Encounter (Signed)
CSW attempted to contact Jerry Cantrell this morning.  No answer and no voice mail.  CSW will mail letter to pt with provider information and CSW contact information.

## 2013-06-30 NOTE — Assessment & Plan Note (Signed)
  Assessment: Progress toward smoking cessation:  smoking the same amount Barriers to progress toward smoking cessation:  lack of motivation to quit Comments: Smoking 5-8 cigarettes/day. He has not tried to cut back, per pt  Plan: Instruction/counseling given:  I counseled patient on the dangers of tobacco use, advised patient to stop smoking, and reviewed strategies to maximize success.

## 2013-06-30 NOTE — Patient Instructions (Addendum)
Increase the Lasix to 40mg  daily (2 tablets). I am checking labs on you today.  I have prescribed Spiriva for you, which is an inhaler that should help your breathing. If you cannot afford this medication, please call the clinic  Be sure to bring your glucose meter to your clinic visit next week.   **Please be sure to take all of your medications as prescribed and bring your meter to your visit next week.   Smoking Cessation, Tips for Success YOU CAN QUIT SMOKING If you are ready to quit smoking, congratulations! You have chosen to help yourself be healthier. Cigarettes bring nicotine, tar, carbon monoxide, and other irritants into your body. Your lungs, heart, and blood vessels will be able to work better without these poisons. There are many different ways to quit smoking. Nicotine gum, nicotine patches, a nicotine inhaler, or nicotine nasal spray can help with physical craving. Hypnosis, support groups, and medicines help break the habit of smoking. Here are some tips to help you quit for good.  Throw away all cigarettes.  Clean and remove all ashtrays from your home, work, and car.  On a card, write down your reasons for quitting. Carry the card with you and read it when you get the urge to smoke.  Cleanse your body of nicotine. Drink enough water and fluids to keep your urine clear or pale yellow. Do this after quitting to flush the nicotine from your body.  Learn to predict your moods. Do not let a bad situation be your excuse to have a cigarette. Some situations in your life might tempt you into wanting a cigarette.  Never have "just one" cigarette. It leads to wanting another and another. Remind yourself of your decision to quit.  Change habits associated with smoking. If you smoked while driving or when feeling stressed, try other activities to replace smoking. Stand up when drinking your coffee. Brush your teeth after eating. Sit in a different chair when you read the paper. Avoid  alcohol while trying to quit, and try to drink fewer caffeinated beverages. Alcohol and caffeine may urge you to smoke.  Avoid foods and drinks that can trigger a desire to smoke, such as sugary or spicy foods and alcohol.  Ask people who smoke not to smoke around you.  Have something planned to do right after eating or having a cup of coffee. Take a walk or exercise to perk you up. This will help to keep you from overeating.  Try a relaxation exercise to calm you down and decrease your stress. Remember, you may be tense and nervous for the first 2 weeks after you quit, but this will pass.  Find new activities to keep your hands busy. Play with a pen, coin, or rubber band. Doodle or draw things on paper.  Brush your teeth right after eating. This will help cut down on the craving for the taste of tobacco after meals. You can try mouthwash, too.  Use oral substitutes, such as lemon drops, carrots, a cinnamon stick, or chewing gum, in place of cigarettes. Keep them handy so they are available when you have the urge to smoke.  When you have the urge to smoke, try deep breathing.  Designate your home as a nonsmoking area.  If you are a heavy smoker, ask your caregiver about a prescription for nicotine chewing gum. It can ease your withdrawal from nicotine.  Reward yourself. Set aside the cigarette money you save and buy yourself something nice.  Look  for support from others. Join a support group or smoking cessation program. Ask someone at home or at work to help you with your plan to quit smoking.  Always ask yourself, "Do I need this cigarette or is this just a reflex?" Tell yourself, "Today, I choose not to smoke," or "I do not want to smoke." You are reminding yourself of your decision to quit, even if you do smoke a cigarette. HOW WILL I FEEL WHEN I QUIT SMOKING?  The benefits of not smoking start within days of quitting.  You may have symptoms of withdrawal because your body is used  to nicotine (the addictive substance in cigarettes). You may crave cigarettes, be irritable, feel very hungry, cough often, get headaches, or have difficulty concentrating.  The withdrawal symptoms are only temporary. They are strongest when you first quit but will go away within 10 to 14 days.  When withdrawal symptoms occur, stay in control. Think about your reasons for quitting. Remind yourself that these are signs that your body is healing and getting used to being without cigarettes.  Remember that withdrawal symptoms are easier to treat than the major diseases that smoking can cause.  Even after the withdrawal is over, expect periodic urges to smoke. However, these cravings are generally short-lived and will go away whether you smoke or not. Do not smoke!  If you relapse and smoke again, do not lose hope. Most smokers quit 3 times before they are successful.  If you relapse, do not give up! Plan ahead and think about what you will do the next time you get the urge to smoke. LIFE AS A NONSMOKER: MAKE IT FOR A MONTH, MAKE IT FOR LIFE Day 1: Hang this page where you will see it every day. Day 2: Get rid of all ashtrays, matches, and lighters. Day 3: Drink water. Breathe deeply between sips. Day 4: Avoid places with smoke-filled air, such as bars, clubs, or the smoking section of restaurants. Day 5: Keep track of how much money you save by not smoking. Day 6: Avoid boredom. Keep a good book with you or go to the movies. Day 7: Reward yourself! One week without smoking! Day 8: Make a dental appointment to get your teeth cleaned. Day 9: Decide how you will turn down a cigarette before it is offered to you. Day 10: Review your reasons for quitting. Day 11: Distract yourself. Stay active to keep your mind off smoking and to relieve tension. Take a walk, exercise, read a book, do a crossword puzzle, or try a new hobby. Day 12: Exercise. Get off the bus before your stop or use stairs instead of  escalators. Day 13: Call on friends for support and encouragement. Day 14: Reward yourself! Two weeks without smoking! Day 15: Practice deep breathing exercises. Day 16: Bet a friend that you can stay a nonsmoker. Day 17: Ask to sit in nonsmoking sections of restaurants. Day 18: Hang up "No Smoking" signs. Day 19: Think of yourself as a nonsmoker. Day 20: Each morning, tell yourself you will not smoke. Day 21: Reward yourself! Three weeks without smoking! Day 22: Think of smoking in negative ways. Remember how it stains your teeth, gives you bad breath, and leaves you short of breath. Day 23: Eat a nutritious breakfast. Day 24:Do not relive your days as a smoker. Day 25: Hold a pencil in your hand when talking on the telephone. Day 26: Tell all your friends you do not smoke. Day 27: Think about  how much better food tastes. Day 28: Remember, one cigarette is one too many. Day 29: Take up a hobby that will keep your hands busy. Day 30: Congratulations! One month without smoking! Give yourself a big reward. Your caregiver can direct you to community resources or hospitals for support, which may include:  Group support.  Education.  Hypnosis.  Subliminal therapy. Document Released: 05/15/2004 Document Revised: 11/09/2011 Document Reviewed: 06/03/2009 [facility] Patient Information 2014 [place], [place].

## 2013-06-30 NOTE — Assessment & Plan Note (Signed)
BP Readings from Last 3 Encounters:  06/30/13 140/100  06/16/13 146/89  05/11/13 162/98    Lab Results  Component Value Date   NA 141 02/02/2013   K 4.5 02/02/2013   CREATININE 1.26 02/02/2013    Assessment: Blood pressure control: mildly elevated Progress toward BP goal:  improved   Plan: Medications:  continue current medications, and increase Lasix to 40mg  daily Educational resources provided:   Self management tools provided:   Other plans: Checking BMP, f/u next week

## 2013-06-30 NOTE — Assessment & Plan Note (Signed)
Patient continued depression regarding his social situations and his health. He is referred to behavioral health at his last visit on 10/17. Also put in a referral for our social worker, Golden Hurter, to talk with the patient and provide community resources for depression for him. She been unable to get in touch with him over the phone, but she was able to meet with him today in the clinic. They were able to have a long discussion, and she was able to provide him with resources. Please see her separate note for more detail.   I hope that with the help of social work and behavioral health, we can help get Jerry Cantrell moving in the right direction.

## 2013-06-30 NOTE — Progress Notes (Signed)
CSW was able to meet with Jerry Cantrell during his [facility] appt today.  Pt states having increased difficulty dealing with pain from diabetic neuropathy, family stressors and maintaining work.  Jerry Cantrell tearful during this encounter.  Pt states he is currently living with his significant other to maintain a relationship with his son. Relationship at home is volatile.  Jerry Cantrell endorses suicidal ideation; pt states he has a plan but will not share with CSW and does not plan to act on it.  Pt states he will not hurt himself and is aware of [facility] are available for walk-in.  Pt in agreement to schedule psychiatry appt, prefers to have CSW set up.  Pt will get his work schedule and return to schedule with CSW.  Jerry Cantrell has future oriented plans, stating he will be moving out of the home in January and is attempting to transfer work to a [place] hospital.  Pt has discussed this with his son and states he is speaking with HR today.  Jerry Cantrell obtained work schedule and schedule appt at [facility] while in [place] office.  Jerry Cantrell provided with card to [facility] for crisis calls, helpline to [facility].  Pt aware CSW is available to assist as needed and has CSW contact information.

## 2013-07-06 ENCOUNTER — Ambulatory Visit (HOSPITAL_COMMUNITY)
Admission: RE | Admit: 2013-07-06 | Discharge: 2013-07-06 | Disposition: A | Discharge status: Home / Self Care | Payer: BC Managed Care – PPO | Source: Ambulatory Visit | Attending: Internal Medicine | Admitting: Internal Medicine

## 2013-07-06 DIAGNOSIS — R0602 Shortness of breath: Secondary | ICD-10-CM | POA: Insufficient documentation

## 2013-07-06 MED ORDER — ALBUTEROL SULFATE (5 MG/ML) 0.5% IN NEBU
2.5000 mg | INHALATION_SOLUTION | Freq: Once | RESPIRATORY_TRACT | Status: AC
  Administered 2013-07-06: 2.5 mg via RESPIRATORY_TRACT

## 2013-07-25 ENCOUNTER — Telehealth: Payer: Self-pay | Admitting: *Deleted

## 2013-07-25 NOTE — Telephone Encounter (Signed)
Pt has been informed.

## 2013-07-25 NOTE — Telephone Encounter (Signed)
Pt called with c/o SOB and swelling to rt leg from knee to foot.  He is taking 2X's amount of lasix, as instructed at last visit, without noted change.    Last seen 10/31 with same complaints.  He is using his inhaler, and has been treated with antibiotics. Pt has not noticed and improvement in SOB and swelling of legs.  Both legs swell on and off but more on rt leg.  Increase SOB with any activity.   Pt offered appointment today but he denied as he can't get off work. I did schedule him for tomorrow afternoon in clinic. Hx: COPD

## 2013-07-25 NOTE — Telephone Encounter (Signed)
Ideally patient should be seen today, but if he is unwilling/unable then agree with appointment for tomorrow.  If his symptoms worsen overnight, he should go to the ED right away.

## 2013-07-26 ENCOUNTER — Ambulatory Visit: Payer: Self-pay | Admitting: Internal Medicine

## 2013-08-09 ENCOUNTER — Ambulatory Visit (HOSPITAL_COMMUNITY): Payer: BC Managed Care – PPO | Admitting: Psychiatry

## 2013-08-09 ENCOUNTER — Encounter: Payer: Self-pay | Admitting: Internal Medicine

## 2013-08-09 ENCOUNTER — Ambulatory Visit (INDEPENDENT_AMBULATORY_CARE_PROVIDER_SITE_OTHER): Payer: BC Managed Care – PPO | Admitting: Internal Medicine

## 2013-08-09 VITALS — BP 151/90 | HR 78 | Temp 97.9°F | Ht 69.75 in | Wt 309.3 lb

## 2013-08-09 DIAGNOSIS — F32A Depression, unspecified: Secondary | ICD-10-CM

## 2013-08-09 DIAGNOSIS — E1142 Type 2 diabetes mellitus with diabetic polyneuropathy: Secondary | ICD-10-CM

## 2013-08-09 DIAGNOSIS — J439 Emphysema, unspecified: Secondary | ICD-10-CM

## 2013-08-09 DIAGNOSIS — I10 Essential (primary) hypertension: Secondary | ICD-10-CM

## 2013-08-09 DIAGNOSIS — F329 Major depressive disorder, single episode, unspecified: Secondary | ICD-10-CM

## 2013-08-09 DIAGNOSIS — F17200 Nicotine dependence, unspecified, uncomplicated: Secondary | ICD-10-CM

## 2013-08-09 DIAGNOSIS — E119 Type 2 diabetes mellitus without complications: Secondary | ICD-10-CM

## 2013-08-09 DIAGNOSIS — Z720 Tobacco use: Secondary | ICD-10-CM

## 2013-08-09 DIAGNOSIS — Z794 Long term (current) use of insulin: Secondary | ICD-10-CM

## 2013-08-09 DIAGNOSIS — J984 Other disorders of lung: Secondary | ICD-10-CM

## 2013-08-09 DIAGNOSIS — R0602 Shortness of breath: Secondary | ICD-10-CM

## 2013-08-09 DIAGNOSIS — E1149 Type 2 diabetes mellitus with other diabetic neurological complication: Secondary | ICD-10-CM

## 2013-08-09 LAB — POCT GLYCOSYLATED HEMOGLOBIN (HGB A1C): Hemoglobin A1C: 7.6

## 2013-08-09 MED ORDER — VERAPAMIL HCL ER 120 MG PO TBCR: 120.0000 mg | EXTENDED_RELEASE_TABLET | Freq: Every day | ORAL | Status: DC

## 2013-08-09 MED ORDER — DULOXETINE HCL 30 MG PO CPEP: 30.0000 mg | ORAL_CAPSULE | Freq: Every day | ORAL | Status: DC

## 2013-08-09 NOTE — Assessment & Plan Note (Signed)
  Assessment: Progress toward smoking cessation:  smoking more Barriers to progress toward smoking cessation:  lack of motivation to quit Comments: Increased smoking 2/2 stress  Plan: Instruction/counseling given:  I counseled patient on the dangers of tobacco use, advised patient to stop smoking, and reviewed strategies to maximize success. Educational resources provided:  [employer] account manager) brochure  Discussed with the patient that quitting smoking will help improve his breathing and will help save the money. At this time he states that he is under too much stress to quit.

## 2013-08-09 NOTE — Assessment & Plan Note (Signed)
Patient with depression over medical comorbidities and social stressors including personal relationships and financial stability. He had an apartment today with behavioral health, but skipped the appointment and came here to his clinic appointment. Behavioral Health was called from the clinic to reschedule he come in for the patient, in the early spell that he had was January 19. Patient denies any current suicidal thoughts or ideation or thoughts of harm. He does endorse having a depressed mood over the past few days, and states he's been in bed because of it. Patient was previously on amitriptyline, primarily for his neuropathic pain, which he states he was only taking intermittently and has not taken it recently. I told him to stop taking the amitriptyline. He's to begin taking Cymbalta which showed not only help with his depression, but also with his neuropathic pain. He was told that this medication will take a few weeks to take full effect, and he acknowledged understanding. He has an appointment with [facility] on September 18, 2013.

## 2013-08-09 NOTE — Patient Instructions (Addendum)
Take the Cymbalta once a day. Stop taking the amitriptyline. Take a couple weeks for this medicine to take full effect, but it should help with her depression and with the nerve pain you're having in your legs.   He need to followup with me in one month for a refill of the Cymbalta in for a blood pressure check and a check of your diabetes control.  Like for you to followup with Debera Lat again for more diabetes education. I have referred you to see her, and the clinical call you to set up an appointment. I would like for you to discuss with her also good meal planning, because I cannot change your insulin regimen unless I know what your meal schedule is like and that it is stable.  Stop taking the amlodipine or Norvasc. Begin taking the verapamil. Hoping that stopping the amlodipine will help improve your swelling, because swelling of the legs is a potential side effect of the amlodipine.  For your shortness of breath, continue using the Spiriva, and use the albuterol as needed. I going to refer you to the lung specialist, a pulmonologist.

## 2013-08-09 NOTE — Assessment & Plan Note (Addendum)
The patient currently taking Spiriva, and he uses an albuterol inhaler as needed. Still complaining of shortness of breath and dyspnea on exertion. PFTs were done on 11/6 which suggests a mixed restrictive and struck the pattern that does not respond to bronchodilator therapy. With these results, not sure if Advair would benefit the patient. I think that most of his symptoms are coming from his smoking history and current tobacco abuse in addition to his morbid obesity. He's to continue the Spiriva for now. Will refer to pulmonology for further evaluation and treatment.

## 2013-08-09 NOTE — Assessment & Plan Note (Signed)
Lab Results  Component Value Date   HGBA1C 7.6 08/09/2013   HGBA1C 7.6 04/12/2013   HGBA1C 7.4 01/17/2013     Assessment: Diabetes control: fair control Progress toward A1C goal:  unchanged Comments: A1c is stable, however his blood sugars are still fluctuating with lows down to 71 and his up to 470s. He states he's not eating and consistent diet. He did not eat this morning, but ate barbecue and macaroni and cheese last night for dinner. She's currently on Novolin R 35 units twice a day with Novolin N as mealtime coverage. With his near hypoglycemic blood sugars and his inconsistent diet I do not want to adjust his insulin. And he needs to undergo more training with our diabetic educator and nutritionist.  Plan: Medications:  continue current medications and referring to [facility] for repeat diabetes education and nutrition counseling Home glucose monitoring: Frequency:   3 times a day Timing:   before meals? Instruction/counseling given: reminded to bring blood glucose meter & log to each visit, reminded to bring medications to each visit, discussed the need for weight loss and discussed diet Educational resources provided: brochure;handout Self management tools provided: copy of home glucose meter download Other plans: Followup in one month

## 2013-08-09 NOTE — Progress Notes (Signed)
Patient ID: Jerry Cantrell, male   DOB: 1966/03/13, 47 y.o.   MRN: [id number]  Subjective:   Patient ID: Jerry Cantrell male   DOB: July 05, 1966 47 y.o.   MRN: [id number]  HPI: [name] is a 47 y.o. M w/ PMH uncontrolled HTN and DM2, who is a current smoker  presents to the clinic c/o continued SOB and BLE edema.  He states that he has been SOB and had LE edema since May. He was seen by me at the end of October and as supposed to f/u in 1 week but did not. At that time I increased his Lasix to 40mg  daily from 20mg , but he has only been taking 20mg . He continues to smoke. PFTs were ordered at his last visit and suggest a mixed obstructive and restrictive pattern. He is currently using Spiriva without improvement in his symptoms.  He was referred to [facility] and had an appt today which he skipped and came to [facility] today. His [facility] appt has been rescheduled for January 19th. He endorses some depression but denies current SI or thoughts of harm. He states that he has been lying in bed for the past few days not eating much.  Pt states that he was seen by an opthalmologist and has been declared legally blind. He states that despite this he is still driving. He did not bring his medical records with his today. He states that he was told to file for disability. He has a f/u appt on Thursday.  He states that he is taking his insulin as prescribed. His A1c is stable today at 7.6. He states that his diet is still not good; he is not eating consistently. He states that he did not eat breakfast this morning, but he ate macaroni and cheese and BBQ last night.    Past Medical History  Diagnosis Date  . Diabetes mellitus   . Hypertension   . Hyperlipidemia   . Meralgia paraesthetica 05/03/2012  . Lumbar spondylosis 05/03/2012  . Lumbar spinal stenosis 05/03/2012    Mild with only right L4 nerve root encroachment, no neurogenic claudication   . 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 Urinary incontinence 05/12/2012     Since the start of Sept. 2013   . Hemorrhoids 05/12/2012  . UTI (lower urinary tract infection)   . Meralgia paraesthetica 05/03/2012    On Lyrica which does improve pain.   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 Substance abuse   . Hyperlipidemia   . Pneumonia    Current Outpatient Prescriptions  Medication Sig Dispense Refill  . albuterol (PROVENTIL HFA;VENTOLIN HFA) 108 (90 BASE) MCG/ACT inhaler Inhale 2 puffs into the lungs every 4 (four) hours as needed for wheezing or shortness of breath.  1 Inhaler  6  . amitriptyline (ELAVIL) 50 MG tablet Take 50 mg by mouth at bedtime.      Marland Kitchen amLODipine (NORVASC) 10 MG tablet Take 1 tablet (10 mg total) by mouth daily.  90 tablet  4  . aspirin 81 MG chewable tablet Chew 1 tablet (81 mg total) by mouth daily.  30 tablet  11  . carvedilol (COREG) 25 MG tablet TAKE ONE TABLET BY MOUTH TWICE DAILY WITH MEALS  60 tablet  6  . dextromethorphan-guaiFENesin (MUCINEX DM) 30-600 MG per 12 hr tablet Take 1 tablet by mouth every 12 (twelve) hours.  60 tablet  0  . furosemide (LASIX) 20 MG tablet Take 1 tablet (20 mg total) by mouth daily.  60 tablet  6  . glucose blood (ONE TOUCH ULTRA TEST) test strip Check your CBG with your meter 3 times per day and as needed for evaluation of low blood sugar.  150 each  12  . ibuprofen (ADVIL) 200 MG tablet Take 1 tablet (200 mg total) by mouth every 6 (six) hours as needed for pain.  100 tablet  0  . insulin NPH (NOVOLIN N RELION) 100 UNIT/ML injection Inject 35 Units into the skin 2 (two) times daily.  30 mL  12  . insulin regular (NOVOLIN R,HUMULIN R) 100 units/mL injection Inject subcutaneous twice per day with your 2 largest meals, based on sliding scale provided by your doctor (1-18 units)  30 mL  3  . Insulin Syringes, Disposable, U-100 0.5 ML MISC For use with insulin  100 each  11  . lisinopril (PRINIVIL,ZESTRIL) 40 MG tablet Take 1 tablet (40 mg total) by mouth daily.  30 tablet  11  . lovastatin (MEVACOR) 20 MG  tablet Take 2 tablets (40 mg total) by mouth daily.  60 tablet  11  . omega-3 acid ethyl esters (LOVAZA) 1 G capsule Take 2 capsules (2 g total) by mouth 2 (two) times daily.  60 capsule  11  . pregabalin (LYRICA) 100 MG capsule Take 1 capsule (100 mg total) by mouth 2 (two) times daily.  60 capsule  6  . tadalafil (CIALIS) 10 MG tablet Take 1 tablet (10 mg total) by mouth as needed for erectile dysfunction.  10 tablet  1  . terazosin (HYTRIN) 1 MG capsule Take 1 capsule (1 mg total) by mouth at bedtime.  30 capsule  11  . tiotropium (SPIRIVA HANDIHALER) 18 MCG inhalation capsule Place 1 capsule (18 mcg total) into inhaler and inhale daily.  30 capsule  2  . traMADol (ULTRAM) 50 MG tablet TAKE ONE TABLET BY MOUTH EVERY 8 HOURS AS NEEDED FOR PAIN  90 tablet  0   No current facility-administered medications for this visit.   Family History  Problem Relation Age of Onset  . Cancer Father     Colon cancer- in remission  . Alcohol abuse Father    History   Social History  . Marital Status: Single    Spouse Name: N/A    Number of Children: N/A  . Years of Education: N/A   Occupational History  . dietary services [place]   Social History Main Topics  . Smoking status: Current Every Day Smoker -- 0.50 packs/day    Types: Cigarettes  . Smokeless tobacco: Never Used     Comment: "Trying - hard to stop"  . Alcohol Use: 7.2 oz/week    12 Cans of beer per week     Comment: occasionally  . Drug Use: No  . Sexual Activity: None   Other Topics Concern  . None   Social History Narrative  . None   Review of Systems: A 12 point ROS was performed; pertinent positives and negatives were noted in the HPI   Objective:  Physical Exam: Filed Vitals:   08/09/13 1045  BP: 151/90  Pulse: 78  Temp: 97.9 F (36.6 C)  TempSrc: Oral  Height: 5' 9.75" (1.772 m)  Weight: 309 lb 4.8 oz (140.298 kg)  SpO2: 95%   Constitutional: Vital signs reviewed.  Patient is an obese male in no acute  distress and cooperative with exam.   Head: Normocephalic and atraumatic Eyes: PERRL, EOMI, sclera injected. Multiple skin growths around the eyes Neck: Supple, Trachea midline  normal  Cardiovascular: RRR, no MRG, pulses symmetric and intact bilaterally Pulmonary/Chest: normal respiratory effort, CTAB, no wheezes, rales, or rhonchi Abdominal: Obese, Soft. Non-tender, non-distended, bowel sounds are normal, no guarding GU: no CVA tenderness Musculoskeletal: Moves all 4 extremities. Ambulates with a limp.  Neurological: A&O x3, non-focal Skin: Warm, dry and intact. Multiple tattoos  Psychiatric: Mildly depressed mood. Speech normal  Assessment & Plan:  Overall Mr. Berrigan needs to take responsibility for his health, which he is not doing. He continues to smoke and he continues to eat whatever he wants. I understand that he is undergoing hard time, but I feel that we've been battling these same issues without much improvement since he came to the clinic last year. Unfortunately the sequelae from his diabetes is worsening. I feel that despite multiple discussions with him regarding the need for better blood pressure control, better blood sugar control, better diet, and exercise, things have not improved, and he is not taking responsiblity for his personal health.  Regarding his eye disease, he is to f/u with the Opthalmologist this week. He is to have the records sent to our clinic.  Please refer to Problem List based Assessment and Plan

## 2013-08-09 NOTE — Assessment & Plan Note (Addendum)
Patient is on Lyrica, but continues to complain of burning pain in his feet and legs. Starting Cymbalta today, and hopefully this will help improve his pain. I discussed with him that better control of his blood glucose should help prevent worsening of the pain and possibly improve his pain.

## 2013-08-09 NOTE — Assessment & Plan Note (Signed)
BP Readings from Last 3 Encounters:  08/09/13 151/90  06/30/13 140/100  06/16/13 146/89    Lab Results  Component Value Date   NA 140 06/30/2013   K 4.3 06/30/2013   CREATININE 1.20 06/30/2013    Assessment: Blood pressure control: mildly elevated Progress toward BP goal:  deteriorated Comments: Patient's blood pressure is elevated today than at his previous visit. He states he's taking all his medications as prescribed.  Plan: Medications:  continue current medications, Except stop the amlodipine. I am concerned this is causing his bilateral extremity edema. Starting verapamil, and will see if his peripheral edema improves Educational resources provided: brochure;handout;video Self management tools provided:   Other plans: Followup in one month, and will check a BMP at that time

## 2013-08-10 NOTE — Progress Notes (Signed)
Case discussed with Dr. Eulas Post at time of visit.  We reviewed the resident's history and exam and pertinent patient test results.  I agree with the assessment, diagnosis, and plan of care documented in the resident's note.

## 2013-08-17 ENCOUNTER — Institutional Professional Consult (permissible substitution): Payer: Self-pay | Admitting: Emergency Medicine

## 2013-08-17 NOTE — Addendum Note (Signed)
Addended by: Hulan Fray on: 08/17/2013 07:07 PM   Modules accepted: Orders

## 2013-09-08 ENCOUNTER — Ambulatory Visit (INDEPENDENT_AMBULATORY_CARE_PROVIDER_SITE_OTHER): Payer: BC Managed Care – PPO | Admitting: Dietician

## 2013-09-08 VITALS — Wt 302.8 lb

## 2013-09-08 DIAGNOSIS — E119 Type 2 diabetes mellitus without complications: Secondary | ICD-10-CM

## 2013-09-08 DIAGNOSIS — Z794 Long term (current) use of insulin: Secondary | ICD-10-CM

## 2013-09-08 NOTE — Progress Notes (Signed)
Medical Nutrition Therapy:  Appt start time: 0945 end time:  1030. Last visit 07-12-2012, visit 1/1 this year Assessment:  Primary concerns today: Blood sugar control and Meal planning.  Wife now is [employer] employee and he reports she wants to help him with diet and exercise for weight loss. They have a limited budget for food.  Usual eating pattern includes 3 meals and 0-1 snacks per day. Frequent foods include sandwiches on whole grain, 40 oz beer every 3 nights or so.  Avoided foods include regular or dark sodas.   Only beverages are diet ginger ale, sugar free tea and beer 24-hr recall: (Up at 8 AM and sometimes 12-1 PM) B (varies depending on when he gets up can be 10 AM or 1-2 PM)- eggs, sausage, bread a few times a week or a sandwich or oatmeal    L ( 6-8 PMPM)-  tuna or [place] sandwiches x2   D ( 9-11 PM)- tuna or [place] sandwiches x2, frozen dinner, mac and cheese, sometimes fried Snk ( 10 PM-midnight)- sweets- frosted homemade cake, 1/2 a honey bun Usual physical activity includes work and ADLs, he is thinking about beginning non-weight bearing exercise classes like spin offered here at the hospital.  Meter download: average is 272 of 53 readings with one hypoglycemic episode yesterday. Patient denies knowing what caused it although suspect it may have been related to alcohol intake. There is little pattern to the standard day report.   Medicine: he reports adherence to 35 units NPH twice daily and per his report of how much Regular insulin he is using takes ~ 70 units of Regular insulin/day   Progress Towards Goal(s):  In progress.   Nutritional Diagnosis:  NB-1.6 Limited adherence to nutrition-related recommendations As related to competing values.  As evidenced by his report and life stresses.    Intervention:  Nutrition education about carb counting meal plan that can incorporate any food.  .  Monitoring/Evaluation:  Dietary intake, exercise, blood sugars, and body weight in 2  month(s).

## 2013-09-08 NOTE — Patient Instructions (Signed)
You need to use a mirror to look at the bottom and outside of you feet every day.  You need new shoes that are at least 1/2 to 1 size ,arger. Can go to [facility] and have them tell you what size you should be wearing.  A Meal plan for you looks like this where you eat whatever carbs and protein you have in your house:  1st meal: 4 carbs, 1 protein 2nd meal: 4 carb choices, 1 protein, 1-2 veggies 3rd meal:4 carb choices, 1 protein, 1-2 veggies Snacks- 1-2 carb choices ( starch, fruit or dairy)   Eating about the same amount of carbs at meals and snacks should help your blood sugars. About 4 carb choices at each meal and 1 for snacks may work for you   Examples:  2 starch + 1 fruit + 1 dairy = 4 carb choices    1 starch + 2 dairy + 1 fruit = 4 carb choices    2 starch + 2 milk = 4 carb choices    1 starch + 1 milk + 2 fruit = 4 carb choices    2 starch + 2 fruit = 4 carb choices    4 starches = 4 carb choices  Veggies, healthy fats like nuts or olives or avacodos and meats do not count- add them as needed.

## 2013-09-14 ENCOUNTER — Telehealth: Payer: Self-pay | Admitting: *Deleted

## 2013-09-14 ENCOUNTER — Institutional Professional Consult (permissible substitution): Payer: Self-pay | Admitting: Emergency Medicine

## 2013-09-14 NOTE — Telephone Encounter (Signed)
Patient called stated that he's having trouble with his feet but can't afford inserts.  He stated he has found a program he can use for shoes because he's Diabetic.  Patient wanted to know what his shoe measurements were.  I informed him we did not measure his feet.  We scanned him for orthotics.  He asked if he could schedule an appointment to come in for a measurement only.  I informed him we do not perform measurements to be used by other unaffiliated companies.

## 2013-09-15 ENCOUNTER — Ambulatory Visit: Payer: Self-pay | Admitting: Internal Medicine

## 2013-09-15 ENCOUNTER — Encounter: Payer: Self-pay | Admitting: Internal Medicine

## 2013-09-15 ENCOUNTER — Ambulatory Visit (INDEPENDENT_AMBULATORY_CARE_PROVIDER_SITE_OTHER): Payer: BC Managed Care – PPO | Admitting: Internal Medicine

## 2013-09-15 VITALS — BP 138/90 | HR 84 | Temp 98.6°F | Ht 73.0 in | Wt 298.8 lb

## 2013-09-15 DIAGNOSIS — R0609 Other forms of dyspnea: Secondary | ICD-10-CM

## 2013-09-15 DIAGNOSIS — R0989 Other specified symptoms and signs involving the circulatory and respiratory systems: Secondary | ICD-10-CM

## 2013-09-15 DIAGNOSIS — F17200 Nicotine dependence, unspecified, uncomplicated: Secondary | ICD-10-CM

## 2013-09-15 DIAGNOSIS — R0600 Dyspnea, unspecified: Secondary | ICD-10-CM

## 2013-09-15 DIAGNOSIS — I10 Essential (primary) hypertension: Secondary | ICD-10-CM

## 2013-09-15 MED ORDER — OLMESARTAN MEDOXOMIL 40 MG PO TABS: 40.0000 mg | ORAL_TABLET | Freq: Every day | ORAL | Status: DC

## 2013-09-15 NOTE — Progress Notes (Signed)
Subjective:    Patient ID: Jerry Cantrell, male    DOB: Mar 02, 1966  MRN: [id number]  HPI  32 yowm active smoker  with doe x April 2014 with dx of pna at [facility] referred to Pulmonary clinic  09/15/2013  by Dr Monica Martinez at [facility] for sob   09/15/2013 1st [name] office visit/ Jerry Cantrell on acei  Chief Complaint  Patient presents with  . Pulmonary Consult    Referred per Dr. Clayburn Pert.  Pt c/o SOB with or without exertion since had "walking pneumonia" April 2014.  He states gets SOB walking short distances and with even eating or taking a shower.  He also c/o occ chest tightness.  He has also noticed some diff with swallowing and "feels like throat closes up".    c/o sob x 100 yards on best days slow pace, flat grade, but sometimess sob  in shower and min better p proair, no better p rx with spiriva but not clear he took it correctly.  No obvious atterns in day to day or daytime variabilty or assoc chronic cough or cp or chest tightness, subjective wheeze overt sinus or hb symptoms. No unusual exp hx or h/o childhood pna/ asthma or knowledge of premature birth.  Sleeping ok without nocturnal  or early am exacerbation  of respiratory  c/o's or need for noct saba. Also denies any obvious fluctuation of symptoms with weather or environmental changes or other aggravating or alleviating factors except as outlined above   Current Medications, Allergies, Complete Past Medical History, Past Surgical History, Family History, and Social History were reviewed in [employer] record.   .          Review of Systems  Constitutional: Negative for fever, chills, activity change, appetite change and unexpected weight change.  HENT: Positive for congestion, sneezing, sore throat and trouble swallowing. Negative for dental problem, postnasal drip, rhinorrhea and voice change.   Eyes: Negative for visual disturbance.  Respiratory: Positive for shortness of breath. Negative for cough and  choking.   Cardiovascular: Negative for chest pain and leg swelling.       Chest tightness  Gastrointestinal: Negative for nausea, vomiting and abdominal pain.  Genitourinary: Negative for difficulty urinating.  Musculoskeletal: Positive for arthralgias.  Skin: Positive for rash.  Psychiatric/Behavioral: Negative for behavioral problems and confusion.       Objective:   Physical Exam  amb obese wm nad Wt Readings from Last 3 Encounters:  09/15/13 298 lb 12.8 oz (135.535 kg)  09/08/13 302 lb 12.8 oz (137.349 kg)  08/09/13 309 lb 4.8 oz (140.298 kg)      HEENT: nl dentition,  and orophanx. Nl external ear canals without cough reflex Edentulous    NECK :  without JVD/Nodes/TM/ nl carotid upstrokes bilaterally   LUNGS: no acc muscle use, clear to A and P bilaterally without cough on insp or exp maneuvers   CV:  RRR  no s3 or murmur or increase in P2, no edema   ABD:  soft and nontender with nl excursion in the supine position. No bruits or organomegaly, bowel sounds nl  MS:  warm without deformities, calf tenderness, cyanosis or clubbing  SKIN: warm and dry without lesions    NEURO:  alert, approp, no deficits       06/16/13 1. Diffuse peribronchial cuffing suggestive of bronchitis.  2. Complete resolution of left lower lobe airspace disease compared  to the prior examination.  3. Mild cardiomegaly with mild cephalization of  the pulmonary  vasculature, but no frank pulmonary edema.       Assessment & Plan:

## 2013-09-15 NOTE — Patient Instructions (Addendum)
Stop lisinopril  and spiriva  Start benicar 40 mg one daily   GERD (REFLUX)  is an extremely common cause of respiratory symptoms, many times with no significant heartburn at all.    It can be treated with medication, but also with lifestyle changes including avoidance of late meals, excessive alcohol, smoking cessation, and avoid fatty foods, chocolate, peppermint, colas, red wine, and acidic juices such as orange juice.  NO MINT OR MENTHOL PRODUCTS SO NO COUGH DROPS  USE SUGARLESS CANDY INSTEAD (jolley ranchers or Stover's)  NO OIL BASED VITAMINS - stop fish oil and eat more baked salmon  Please schedule a follow up office visit in 4 weeks, sooner if needed

## 2013-09-16 DIAGNOSIS — R0600 Dyspnea, unspecified: Secondary | ICD-10-CM | POA: Insufficient documentation

## 2013-09-16 NOTE — Assessment & Plan Note (Signed)
I took an extended  opportunity with this patient to outline the consequences of continued cigarette use  in airway disorders based on all the data we have from the multiple national lung health studies (perfomed over decades at millions of dollars in cost)  indicating that smoking cessation, not choice of inhalers or physicians, is the most important aspect of care.   

## 2013-09-16 NOTE — Assessment & Plan Note (Signed)
ACE inhibitors are problematic in  pts with airway complaints because  even experienced pulmonologists can't always distinguish ace effects from copd/asthma.  By themselves they don't actually cause a problem, much like oxygen can't by itself start a fire, but they certainly serve as a powerful catalyst or enhancer for any "fire"  or inflammatory process in the upper airway, be it caused by an ET  tube or more commonly reflux (especially in the obese or pts with known GERD or who are on biphoshonates).    In the era of ARB near equivalency until we have a better handle on the reversibility of the airway problem, it just makes sense to avoid ACEI  entirely in the short run and then decide later, having established a level of airway control using a reasonable limited regimen, whether to add back ace but even then being very careful to observe the pt for worsening airway control and number of meds used/ needed to control symptoms.   Try benicar samples 40 mg daily x 4 weeks

## 2013-09-16 NOTE — Assessment & Plan Note (Addendum)
-   PFT's 07/06/13  FEV1  3.03 (69%) ratio 80 and no change p B2,  dlco 86%  Symptoms are markedly disproportionate to objective findings and not clear this is a lung problem but pt does appear to have difficult airway management issues.  DDX of  difficult airways managment all start with A and  include Adherence, Ace Inhibitors, Acid Reflux, Active Sinus Disease, Alpha 1 Antitripsin deficiency, Anxiety masquerading as Airways dz,  ABPA,  allergy(esp in young), Aspiration (esp in elderly), Adverse effects of DPI,  Active smokers, plus two Bs  = Bronchiectasis and Beta blocker use..and one C= CHF  acei at to the top of the usual suspects, esp with sensation of choking reported. See hbp for trial of alternative rx  Active smoking the other sign concern > see smoking  A/p   Beta blocker effects also a concern though note absence of any significant airflow obst on pfts   See instructions for specific recommendations which were reviewed directly with the patient who was given a copy with highlighter outlining the key components.

## 2013-09-18 ENCOUNTER — Ambulatory Visit (HOSPITAL_COMMUNITY): Payer: Self-pay | Admitting: Psychiatry

## 2013-09-26 ENCOUNTER — Ambulatory Visit (INDEPENDENT_AMBULATORY_CARE_PROVIDER_SITE_OTHER): Payer: BC Managed Care – PPO | Admitting: Internal Medicine

## 2013-09-26 ENCOUNTER — Encounter: Payer: Self-pay | Admitting: Licensed Clinical Social Worker

## 2013-09-26 ENCOUNTER — Other Ambulatory Visit: Payer: Self-pay | Admitting: Internal Medicine

## 2013-09-26 ENCOUNTER — Encounter: Payer: Self-pay | Admitting: Internal Medicine

## 2013-09-26 VITALS — BP 193/117 | HR 86 | Temp 98.3°F | Ht 73.0 in | Wt 301.9 lb

## 2013-09-26 DIAGNOSIS — E119 Type 2 diabetes mellitus without complications: Secondary | ICD-10-CM

## 2013-09-26 DIAGNOSIS — Z794 Long term (current) use of insulin: Secondary | ICD-10-CM

## 2013-09-26 DIAGNOSIS — I10 Essential (primary) hypertension: Secondary | ICD-10-CM

## 2013-09-26 DIAGNOSIS — F17200 Nicotine dependence, unspecified, uncomplicated: Secondary | ICD-10-CM

## 2013-09-26 DIAGNOSIS — E1142 Type 2 diabetes mellitus with diabetic polyneuropathy: Secondary | ICD-10-CM

## 2013-09-26 NOTE — Patient Instructions (Signed)
Take 28u of the Lantus 2 times a day. I am providing you with one vial from our clinic. Once you are able to afford the NPH insulin, we will switch back to it. Continue to take the regular insulin as you have been doing.  The blood pressure medications are on the $4 list. Please be sure to go to the pharmacy today and pick these up. Your blood pressure is very high today.   Return to the clinic in about 1 month and we will check your A1c and blood pressure at that time.

## 2013-09-26 NOTE — Progress Notes (Unsigned)
CSW briefly met with Mr. Weisel.  Pt states he will return to [facility], that he has a 10:30 am appt.  Pt states he can not afford his medications.  Discussed with PCP, there are no prescription assistance programs [facility] participates in that will assist with Mr. Rubenstein insulin needs.  However, CSW printed PAP applications for Ultram and Lyrica.  Cymbalta, Benicar and Cialis did not have a PAP available to pt.  All other medications can be obtained through [employer] $4/mo, $10/90day supply.  Pt completed PAP applications with CSW, will need to bring in last years tax return.  Anticipating Friday 1/30.  CSW will forward to PCP to complete portions once pt has brought in needed documentation.

## 2013-09-26 NOTE — Assessment & Plan Note (Addendum)
Lab Results  Component Value Date   HGBA1C 7.6 08/09/2013   HGBA1C 7.6 04/12/2013   HGBA1C 7.4 01/17/2013     Assessment: Diabetes control: fair control Progress toward A1C goal:  unchanged Comments: Pt unable to afford his NPH insulin due to his new deduct able at the start of the year, so he is using old Lantus, 28u BID. His CBGs do seem better controlled with the Lantus.   Plan: Medications:  For now, we will switch to the Lantus 28u BID as we have samples in the clinic; he was given a sample today. Once he is able to afford the NPH, we will switch back to it. He is to continue the Regular insulin sliding scale with meals. Home glucose monitoring: Frequency: 3 times a day Timing: before meals Instruction/counseling given: reminded to bring blood glucose meter & log to each visit, reminded to bring medications to each visit and discussed diet Educational resources provided: brochure;handout Self management tools provided: copy of home glucose meter download Other plans: F/u in about 6 weeks for A1c

## 2013-09-26 NOTE — Assessment & Plan Note (Signed)
  Assessment: Progress toward smoking cessation:  smoking the same amount Barriers to progress toward smoking cessation:  lack of motivation to quit;stress Comments: Pt was recetnly seen by Pulmonology and was strongly encouraged to quit smoking, yet he continues so smoke about 1/3ppd or more. He states that he is trying to cut back.   Plan: Instruction/counseling given:  I counseled patient on the dangers of tobacco use, advised patient to stop smoking, and reviewed strategies to maximize success. Educational resources provided:  [facility] (1-800-QUIT-NOW) brochure Self management tools provided:    Medications to assist with smoking cessation:  Pt declines

## 2013-09-26 NOTE — Assessment & Plan Note (Addendum)
BP Readings from Last 3 Encounters:  09/26/13 193/117  09/15/13 138/90  08/09/13 151/90    Lab Results  Component Value Date   NA 140 06/30/2013   K 4.3 06/30/2013   CREATININE 1.20 06/30/2013    Assessment: Blood pressure control: severely elevated Progress toward BP goal:  deteriorated Comments: Pt off all BP meds, except for the Benicar provided by Jerry Cantrell. He states that he is unable to afford the other medicaitons; however, they are on the $4 formulary. This was discussed with the patient.  Plan: Medications:  continue current medications Educational resources provided: brochure;handout;video Self management tools provided:   Other plans: Jerry Cantrell was strongly advised to pick up his carvedilol, amlodipine, and furosemide from the pharmacy today. Since he was given a sample of insulin today, hopefully this will take some financial pressure off of him and he will purchase his other medications, as his blood pressure was significantly elevated today. He denied any headaches or acute vision changes today, and his BP control has been a chronic issue. 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  - f/u in 6 weeks

## 2013-09-26 NOTE — Progress Notes (Signed)
Patient ID: Jerry Cantrell, male   DOB: 1966/07/30, 48 y.o.   MRN: [id number]  Subjective:   Patient ID: Jerry Cantrell male   DOB: 08-23-1966 48 y.o.   MRN: [id number]  HPI: [name] is a 48 y.o. w/ PMH uncontrolled HTN and DM2, who is a current smoker presents to the clinic for a routine f/u of his chronic medical conditions.   He was seen by our diabetes educator on 1/9 and she discussed diet with him. At that time, he endorsed compliance with his NPH 35u BID and about 70u of regular insulin. However, today he states that he has been unable to afford his medications and is using old Lantus since the middle of January. His blood sugar this morning is 120. He states that he is trying to exercise more and has joined a gym.   He states that he has run out of his blood pressure medicine and has not taken any in a few weeks due to the cost. However most of his BP medications are $4 at [employer].  He was seen by Pulmonology and smoking cessation was strongly stressed. His ACEi was stopped and he was switched to Benicar and was given a 30 day supply.   The pt states that he is smoking 1/3ppd. He states that he is trying to quit and has cut back.  He is not taking a number of his medications and is not really sure exactly what he is taking. He is sure he taking Benicar and the old Lantus and the regular insulin.   Past Medical History  Diagnosis Date  . Diabetes mellitus   . Hypertension   . Hyperlipidemia   . Meralgia paraesthetica 05/03/2012  . Lumbar spondylosis 05/03/2012  . Lumbar spinal stenosis 05/03/2012    Mild with only right L4 nerve root encroachment, no neurogenic claudication   . 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 Urinary incontinence 05/12/2012    Since the start of Sept. 2013   . Hemorrhoids 05/12/2012  . UTI (lower urinary tract infection)   . Meralgia paraesthetica 05/03/2012    On Lyrica which does improve pain.   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 Substance abuse    . Hyperlipidemia   . Pneumonia    Current Outpatient Prescriptions  Medication Sig Dispense Refill  . albuterol (PROVENTIL HFA;VENTOLIN HFA) 108 (90 BASE) MCG/ACT inhaler Inhale 2 puffs into the lungs every 4 (four) hours as needed for wheezing or shortness of breath.  1 Inhaler  6  . amitriptyline (ELAVIL) 50 MG tablet Take 1 tablet by mouth at bedtime.      Marland Kitchen amLODipine (NORVASC) 10 MG tablet Take 1 tablet by mouth daily.      Marland Kitchen aspirin 81 MG chewable tablet Chew 1 tablet (81 mg total) by mouth daily.  30 tablet  11  . carvedilol (COREG) 25 MG tablet TAKE ONE TABLET BY MOUTH TWICE DAILY WITH MEALS  60 tablet  6  . DULoxetine (CYMBALTA) 30 MG capsule Take 1 capsule (30 mg total) by mouth daily.  30 capsule  1  . furosemide (LASIX) 20 MG tablet Take 1 tablet (20 mg total) by mouth daily.  60 tablet  6  . glucose blood (ONE TOUCH ULTRA TEST) test strip Check your CBG with your meter 3 times per day and as needed for evaluation of low blood sugar.  150 each  12  . ibuprofen (ADVIL) 200 MG tablet Take 1 tablet (200 mg total) by mouth every  6 (six) hours as needed for pain.  100 tablet  0  . insulin NPH (NOVOLIN N RELION) 100 UNIT/ML injection Inject 35 Units into the skin 2 (two) times daily.  30 mL  12  . insulin regular (NOVOLIN R,HUMULIN R) 100 units/mL injection Inject subcutaneous twice per day with your 2 largest meals, based on sliding scale provided by your doctor (1-18 units)  30 mL  3  . Insulin Syringes, Disposable, U-100 0.5 ML MISC For use with insulin  100 each  11  . lovastatin (MEVACOR) 20 MG tablet Take 2 tablets (40 mg total) by mouth daily.  60 tablet  11  . olmesartan (BENICAR) 40 MG tablet Take 1 tablet (40 mg total) by mouth daily.      . pregabalin (LYRICA) 100 MG capsule Take 1 capsule (100 mg total) by mouth 2 (two) times daily.  60 capsule  6  . tadalafil (CIALIS) 10 MG tablet Take 1 tablet (10 mg total) by mouth as needed for erectile dysfunction.  10 tablet  1  .  terazosin (HYTRIN) 1 MG capsule Take 1 capsule (1 mg total) by mouth at bedtime.  30 capsule  11  . traMADol (ULTRAM) 50 MG tablet TAKE ONE TABLET BY MOUTH EVERY 8 HOURS AS NEEDED FOR PAIN  90 tablet  0  . verapamil (CALAN-SR) 120 MG CR tablet Take 1 tablet (120 mg total) by mouth daily.  30 tablet  11   No current facility-administered medications for this visit.   Family History  Problem Relation Age of Onset  . Prostate cancer Father   . Alcohol abuse Father   . Emphysema Father     smoked   History   Social History  . Marital Status: Single    Spouse Name: N/A    Number of Children: N/A  . Years of Education: N/A   Occupational History  . 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 Smoking status: Current Every Day Smoker -- 0.30 packs/day for 30 years    Types: Cigarettes  . Smokeless tobacco: Never Used     Comment: "Trying - hard to stop"  . Alcohol Use: 3.6 oz/week    6 Cans of beer per week     Comment: beer  . Drug Use: No  . Sexual Activity: None   Other Topics Concern  . None   Social History Narrative  . None   Review of Systems: Constitutional: Denies fever, chills, appetite change and fatigue.  Respiratory: Denies SOB, cough. +DOE  Cardiovascular: Denies chest pain. +leg swelling.  Gastrointestinal: Denies nausea, vomiting, abdominal pain, diarrhea, constipation Genitourinary: Denies dysuria, urgency, frequency Musculoskeletal: +pain in bilateral knees and feet and pain with ambulation Skin: Denies rash or wound.  Neurological: Denies dizziness, syncope, weakness, or headaches.  Psychiatric/Behavioral: Denies suicidal ideation, mood changes  Objective:  Physical Exam: Filed Vitals:   09/26/13 0925  BP: 195/115  Pulse: 84  Temp: 98.3 F (36.8 C)  TempSrc: Oral  Height: 6\' 1"  (1.854 m)  Weight: 301 lb 14.4 oz (136.941 kg)  SpO2: 99%   Constitutional: Vital signs reviewed.  Patient is a well-developed and well-nourished  male in no acute distress and cooperative with exam.  Head: Normocephalic and atraumatic Eyes: PERRL, EOMI. No scleral icterus.  Cardiovascular: RRR, no MRG Pulmonary/Chest: normal respiratory effort Abdominal: Soft. Non-tender. +distension Musculoskeletal: No joint deformities, erythema. +stiffness at the knees. Moves all 4 extremities. Neurological: A&O x3, non focal Skin: Warm, dry  and intact. Multiple tattoos  Psychiatric: Normal mood and affect. speech and behavior is normal.   Assessment & Plan:   Please refer to Problem List based Assessment and Plan

## 2013-09-28 NOTE — Progress Notes (Signed)
Case discussed with Dr. Glenn at the time of the visit.  We reviewed the resident's history and exam and pertinent patient test results.  I agree with the assessment, diagnosis, and plan of care documented in the resident's note.   

## 2013-10-18 ENCOUNTER — Encounter: Payer: Self-pay | Admitting: Licensed Clinical Social Worker

## 2013-10-19 ENCOUNTER — Encounter: Payer: Self-pay | Admitting: Licensed Clinical Social Worker

## 2013-10-19 NOTE — Progress Notes (Unsigned)
Mr. Lilly presents to [facility] today to provide CSW with Tax form 1040 2013 to send in for medication assistance programs.  Attempting to applying for Proventil HFA, Lyrica, and Ultram.  However, pt's address can not be confirmed.  Pt has different address on forms than in EMR.  Attempted to call Mr. Philip on 10/19/13 at home number but this number is disconnected.  CSW will forward to forms to PCP to start process and await response from Mr. Hernandez.  Will need to confirm address as medications will be mailed to home address.

## 2013-10-25 ENCOUNTER — Encounter (HOSPITAL_COMMUNITY): Payer: Self-pay | Admitting: Emergency Medicine

## 2013-10-25 ENCOUNTER — Encounter: Payer: Self-pay | Admitting: Internal Medicine

## 2013-10-25 ENCOUNTER — Telehealth: Payer: Self-pay | Admitting: Internal Medicine

## 2013-10-25 ENCOUNTER — Other Ambulatory Visit: Payer: Self-pay | Admitting: Internal Medicine

## 2013-10-25 ENCOUNTER — Ambulatory Visit (INDEPENDENT_AMBULATORY_CARE_PROVIDER_SITE_OTHER): Payer: BC Managed Care – PPO | Admitting: Internal Medicine

## 2013-10-25 ENCOUNTER — Encounter (HOSPITAL_COMMUNITY): Payer: Self-pay | Admitting: *Deleted

## 2013-10-25 ENCOUNTER — Ambulatory Visit (HOSPITAL_COMMUNITY): Admission: RE | Admit: 2013-10-25 | Payer: BC Managed Care – PPO | Source: Home / Self Care | Admitting: Psychiatry

## 2013-10-25 ENCOUNTER — Other Ambulatory Visit (INDEPENDENT_AMBULATORY_CARE_PROVIDER_SITE_OTHER): Payer: BC Managed Care – PPO

## 2013-10-25 ENCOUNTER — Ambulatory Visit (INDEPENDENT_AMBULATORY_CARE_PROVIDER_SITE_OTHER)
Admission: RE | Admit: 2013-10-25 | Discharge: 2013-10-25 | Disposition: A | Discharge status: Home / Self Care | Payer: BC Managed Care – PPO | Source: Ambulatory Visit | Attending: Internal Medicine | Admitting: Internal Medicine

## 2013-10-25 ENCOUNTER — Emergency Department (HOSPITAL_COMMUNITY)
Admission: EM | Admit: 2013-10-25 | Discharge: 2013-10-26 | Disposition: A | Discharge status: Home / Self Care | Payer: BC Managed Care – PPO | Attending: Emergency Medicine | Admitting: Emergency Medicine

## 2013-10-25 ENCOUNTER — Ambulatory Visit (HOSPITAL_COMMUNITY)
Admission: RE | Admit: 2013-10-25 | Discharge: 2013-10-25 | Disposition: A | Discharge status: Home / Self Care | Payer: BC Managed Care – PPO | Attending: Psychiatry | Admitting: Psychiatry

## 2013-10-25 VITALS — BP 174/102 | HR 86 | Temp 99.0°F | Ht 73.0 in | Wt 307.6 lb

## 2013-10-25 DIAGNOSIS — M7989 Other specified soft tissue disorders: Secondary | ICD-10-CM

## 2013-10-25 DIAGNOSIS — Z7982 Long term (current) use of aspirin: Secondary | ICD-10-CM | POA: Insufficient documentation

## 2013-10-25 DIAGNOSIS — R05 Cough: Secondary | ICD-10-CM

## 2013-10-25 DIAGNOSIS — M545 Low back pain, unspecified: Secondary | ICD-10-CM

## 2013-10-25 DIAGNOSIS — R0989 Other specified symptoms and signs involving the circulatory and respiratory systems: Secondary | ICD-10-CM

## 2013-10-25 DIAGNOSIS — L918 Other hypertrophic disorders of the skin: Secondary | ICD-10-CM

## 2013-10-25 DIAGNOSIS — E1142 Type 2 diabetes mellitus with diabetic polyneuropathy: Secondary | ICD-10-CM | POA: Insufficient documentation

## 2013-10-25 DIAGNOSIS — Z794 Long term (current) use of insulin: Secondary | ICD-10-CM | POA: Insufficient documentation

## 2013-10-25 DIAGNOSIS — Z87440 Personal history of urinary (tract) infections: Secondary | ICD-10-CM | POA: Insufficient documentation

## 2013-10-25 DIAGNOSIS — F17200 Nicotine dependence, unspecified, uncomplicated: Secondary | ICD-10-CM | POA: Insufficient documentation

## 2013-10-25 DIAGNOSIS — M47816 Spondylosis without myelopathy or radiculopathy, lumbar region: Secondary | ICD-10-CM

## 2013-10-25 DIAGNOSIS — R0609 Other forms of dyspnea: Secondary | ICD-10-CM

## 2013-10-25 DIAGNOSIS — H5789 Other specified disorders of eye and adnexa: Secondary | ICD-10-CM

## 2013-10-25 DIAGNOSIS — H00029 Hordeolum internum unspecified eye, unspecified eyelid: Secondary | ICD-10-CM

## 2013-10-25 DIAGNOSIS — Z800 Family history of malignant neoplasm of digestive organs: Secondary | ICD-10-CM

## 2013-10-25 DIAGNOSIS — E785 Hyperlipidemia, unspecified: Secondary | ICD-10-CM | POA: Insufficient documentation

## 2013-10-25 DIAGNOSIS — E1169 Type 2 diabetes mellitus with other specified complication: Secondary | ICD-10-CM

## 2013-10-25 DIAGNOSIS — H01003 Unspecified blepharitis right eye, unspecified eyelid: Secondary | ICD-10-CM

## 2013-10-25 DIAGNOSIS — Z0000 Encounter for general adult medical examination without abnormal findings: Secondary | ICD-10-CM

## 2013-10-25 DIAGNOSIS — K649 Unspecified hemorrhoids: Secondary | ICD-10-CM

## 2013-10-25 DIAGNOSIS — E113599 Type 2 diabetes mellitus with proliferative diabetic retinopathy without macular edema, unspecified eye: Secondary | ICD-10-CM

## 2013-10-25 DIAGNOSIS — E11311 Type 2 diabetes mellitus with unspecified diabetic retinopathy with macular edema: Secondary | ICD-10-CM

## 2013-10-25 DIAGNOSIS — G4733 Obstructive sleep apnea (adult) (pediatric): Secondary | ICD-10-CM

## 2013-10-25 DIAGNOSIS — R059 Cough, unspecified: Secondary | ICD-10-CM

## 2013-10-25 DIAGNOSIS — R0600 Dyspnea, unspecified: Secondary | ICD-10-CM

## 2013-10-25 DIAGNOSIS — I10 Essential (primary) hypertension: Secondary | ICD-10-CM

## 2013-10-25 DIAGNOSIS — Z79899 Other long term (current) drug therapy: Secondary | ICD-10-CM | POA: Insufficient documentation

## 2013-10-25 DIAGNOSIS — E119 Type 2 diabetes mellitus without complications: Secondary | ICD-10-CM

## 2013-10-25 DIAGNOSIS — Z8701 Personal history of pneumonia (recurrent): Secondary | ICD-10-CM | POA: Insufficient documentation

## 2013-10-25 DIAGNOSIS — M48061 Spinal stenosis, lumbar region without neurogenic claudication: Secondary | ICD-10-CM

## 2013-10-25 DIAGNOSIS — H01006 Unspecified blepharitis left eye, unspecified eyelid: Secondary | ICD-10-CM

## 2013-10-25 DIAGNOSIS — R269 Unspecified abnormalities of gait and mobility: Secondary | ICD-10-CM

## 2013-10-25 DIAGNOSIS — F329 Major depressive disorder, single episode, unspecified: Secondary | ICD-10-CM

## 2013-10-25 DIAGNOSIS — N058 Unspecified nephritic syndrome with other morphologic changes: Secondary | ICD-10-CM | POA: Insufficient documentation

## 2013-10-25 DIAGNOSIS — E1129 Type 2 diabetes mellitus with other diabetic kidney complication: Secondary | ICD-10-CM | POA: Insufficient documentation

## 2013-10-25 DIAGNOSIS — M722 Plantar fascial fibromatosis: Secondary | ICD-10-CM

## 2013-10-25 DIAGNOSIS — Z86010 Personal history of colonic polyps: Secondary | ICD-10-CM

## 2013-10-25 DIAGNOSIS — R45851 Suicidal ideations: Secondary | ICD-10-CM

## 2013-10-25 DIAGNOSIS — K625 Hemorrhage of anus and rectum: Secondary | ICD-10-CM

## 2013-10-25 DIAGNOSIS — G5710 Meralgia paresthetica, unspecified lower limb: Secondary | ICD-10-CM | POA: Insufficient documentation

## 2013-10-25 DIAGNOSIS — N521 Erectile dysfunction due to diseases classified elsewhere: Secondary | ICD-10-CM

## 2013-10-25 DIAGNOSIS — F1410 Cocaine abuse, uncomplicated: Secondary | ICD-10-CM | POA: Insufficient documentation

## 2013-10-25 DIAGNOSIS — H53452 Other localized visual field defect, left eye: Secondary | ICD-10-CM

## 2013-10-25 DIAGNOSIS — E1149 Type 2 diabetes mellitus with other diabetic neurological complication: Secondary | ICD-10-CM | POA: Insufficient documentation

## 2013-10-25 DIAGNOSIS — F32A Depression, unspecified: Secondary | ICD-10-CM

## 2013-10-25 DIAGNOSIS — G8929 Other chronic pain: Secondary | ICD-10-CM

## 2013-10-25 DIAGNOSIS — E1136 Type 2 diabetes mellitus with diabetic cataract: Secondary | ICD-10-CM

## 2013-10-25 DIAGNOSIS — Z8739 Personal history of other diseases of the musculoskeletal system and connective tissue: Secondary | ICD-10-CM | POA: Insufficient documentation

## 2013-10-25 DIAGNOSIS — R32 Unspecified urinary incontinence: Secondary | ICD-10-CM

## 2013-10-25 DIAGNOSIS — F4325 Adjustment disorder with mixed disturbance of emotions and conduct: Secondary | ICD-10-CM

## 2013-10-25 LAB — CBG MONITORING, ED: Glucose-Capillary: 152 mg/dL — ABNORMAL HIGH (ref 70–99)

## 2013-10-25 LAB — CBC WITH DIFFERENTIAL/PLATELET
Basophils Absolute: 0.1 10*3/uL (ref 0.0–0.1)
Basophils Relative: 1 % (ref 0.0–3.0)
EOS PCT: 1.8 % (ref 0.0–5.0)
Eosinophils Absolute: 0.2 10*3/uL (ref 0.0–0.7)
HCT: 52.1 % — ABNORMAL HIGH (ref 39.0–52.0)
Hemoglobin: 17 g/dL (ref 13.0–17.0)
Lymphocytes Relative: 21.4 % (ref 12.0–46.0)
Lymphs Abs: 1.8 10*3/uL (ref 0.7–4.0)
MCHC: 32.5 g/dL (ref 30.0–36.0)
MCV: 91.1 fl (ref 78.0–100.0)
MONO ABS: 0.7 10*3/uL (ref 0.1–1.0)
Monocytes Relative: 8.1 % (ref 3.0–12.0)
NEUTROS PCT: 67.7 % (ref 43.0–77.0)
Neutro Abs: 5.7 10*3/uL (ref 1.4–7.7)
PLATELETS: 166 10*3/uL (ref 150.0–400.0)
RBC: 5.72 Mil/uL (ref 4.22–5.81)
RDW: 14.4 % (ref 11.5–14.6)
WBC: 8.4 10*3/uL (ref 4.5–10.5)

## 2013-10-25 LAB — CBC
HEMATOCRIT: 50.8 % (ref 39.0–52.0)
HEMOGLOBIN: 16.9 g/dL (ref 13.0–17.0)
MCH: 30.3 pg (ref 26.0–34.0)
MCHC: 33.3 g/dL (ref 30.0–36.0)
MCV: 91 fL (ref 78.0–100.0)
Platelets: 168 10*3/uL (ref 150–400)
RBC: 5.58 MIL/uL (ref 4.22–5.81)
RDW: 13.7 % (ref 11.5–15.5)
WBC: 9.2 10*3/uL (ref 4.0–10.5)

## 2013-10-25 LAB — TSH: TSH: 2.76 u[IU]/mL (ref 0.35–5.50)

## 2013-10-25 LAB — COMPREHENSIVE METABOLIC PANEL
ALK PHOS: 103 U/L (ref 39–117)
ALT: 13 U/L (ref 0–53)
AST: 15 U/L (ref 0–37)
Albumin: 2.4 g/dL — ABNORMAL LOW (ref 3.5–5.2)
BILIRUBIN TOTAL: 0.2 mg/dL — AB (ref 0.3–1.2)
BUN: 22 mg/dL (ref 6–23)
CHLORIDE: 102 meq/L (ref 96–112)
CO2: 25 mEq/L (ref 19–32)
Calcium: 9.2 mg/dL (ref 8.4–10.5)
Creatinine, Ser: 1.44 mg/dL — ABNORMAL HIGH (ref 0.50–1.35)
GFR calc Af Amer: 65 mL/min — ABNORMAL LOW (ref >90)
GFR calc non Af Amer: 56 mL/min — ABNORMAL LOW (ref >90)
Glucose, Bld: 222 mg/dL — ABNORMAL HIGH (ref 70–99)
POTASSIUM: 4.2 meq/L (ref 3.7–5.3)
SODIUM: 138 meq/L (ref 137–147)
Total Protein: 6.1 g/dL (ref 6.0–8.3)

## 2013-10-25 LAB — ACETAMINOPHEN LEVEL

## 2013-10-25 LAB — BASIC METABOLIC PANEL
BUN: 19 mg/dL (ref 6–23)
CALCIUM: 8.9 mg/dL (ref 8.4–10.5)
CO2: 30 mEq/L (ref 19–32)
CREATININE: 1.3 mg/dL (ref 0.4–1.5)
Chloride: 102 mEq/L (ref 96–112)
GFR: 64.3 mL/min (ref >60.00)
Glucose, Bld: 127 mg/dL — ABNORMAL HIGH (ref 70–99)
Potassium: 4.6 mEq/L (ref 3.5–5.1)
Sodium: 140 mEq/L (ref 135–145)

## 2013-10-25 LAB — ETHANOL: Alcohol, Ethyl (B): <11 mg/dL (ref 0–11)

## 2013-10-25 LAB — RAPID URINE DRUG SCREEN, HOSP PERFORMED
AMPHETAMINES: NOT DETECTED
Barbiturates: NOT DETECTED
Benzodiazepines: NOT DETECTED
Cocaine: POSITIVE — AB
OPIATES: NOT DETECTED
Tetrahydrocannabinol: NOT DETECTED

## 2013-10-25 LAB — SALICYLATE LEVEL: Salicylate Lvl: <2 mg/dL — ABNORMAL LOW (ref 2.8–20.0)

## 2013-10-25 LAB — BRAIN NATRIURETIC PEPTIDE: PRO B NATRI PEPTIDE: 270 pg/mL — AB (ref 0.0–100.0)

## 2013-10-25 MED ORDER — ALUM & MAG HYDROXIDE-SIMETH 200-200-20 MG/5ML PO SUSP: 30.0000 mL | ORAL | Status: DC | PRN

## 2013-10-25 MED ORDER — ZOLPIDEM TARTRATE 5 MG PO TABS: 5.0000 mg | ORAL_TABLET | Freq: Every evening | ORAL | Status: DC | PRN

## 2013-10-25 MED ORDER — ONDANSETRON HCL 4 MG PO TABS: 4.0000 mg | ORAL_TABLET | Freq: Three times a day (TID) | ORAL | Status: DC | PRN

## 2013-10-25 MED ORDER — IBUPROFEN 200 MG PO TABS: 600.0000 mg | ORAL_TABLET | Freq: Three times a day (TID) | ORAL | Status: DC | PRN

## 2013-10-25 MED ORDER — INSULIN NPH (HUMAN) (ISOPHANE) 100 UNIT/ML ~~LOC~~ SUSP
35.0000 [IU] | Freq: Two times a day (BID) | SUBCUTANEOUS | Status: DC
  Administered 2013-10-25: 35 [IU] via SUBCUTANEOUS
  Filled 2013-10-25: qty 10

## 2013-10-25 MED ORDER — FUROSEMIDE 20 MG PO TABS
20.0000 mg | ORAL_TABLET | Freq: Every day | ORAL | Status: DC
  Administered 2013-10-26: 20 mg via ORAL
  Filled 2013-10-25: qty 1

## 2013-10-25 MED ORDER — ASPIRIN 81 MG PO CHEW
81.0000 mg | CHEWABLE_TABLET | Freq: Every day | ORAL | Status: DC
  Administered 2013-10-25 – 2013-10-26 (×2): 81 mg via ORAL
  Filled 2013-10-25 (×2): qty 1

## 2013-10-25 MED ORDER — ACETAMINOPHEN 325 MG PO TABS
650.0000 mg | ORAL_TABLET | ORAL | Status: DC | PRN
  Administered 2013-10-26: 650 mg via ORAL
  Filled 2013-10-25: qty 2

## 2013-10-25 MED ORDER — NICOTINE 21 MG/24HR TD PT24
21.0000 mg | MEDICATED_PATCH | Freq: Every day | TRANSDERMAL | Status: DC
  Administered 2013-10-25: 21 mg via TRANSDERMAL
  Filled 2013-10-25: qty 1

## 2013-10-25 MED ORDER — ALBUTEROL SULFATE HFA 108 (90 BASE) MCG/ACT IN AERS: 2.0000 | INHALATION_SPRAY | RESPIRATORY_TRACT | Status: DC | PRN

## 2013-10-25 MED ORDER — IRBESARTAN 300 MG PO TABS
300.0000 mg | ORAL_TABLET | Freq: Every day | ORAL | Status: DC
  Administered 2013-10-25 – 2013-10-26 (×2): 300 mg via ORAL
  Filled 2013-10-25 (×2): qty 1

## 2013-10-25 MED ORDER — INSULIN ASPART 100 UNIT/ML ~~LOC~~ SOLN
20.0000 [IU] | Freq: Once | SUBCUTANEOUS | Status: AC
  Administered 2013-10-26: 20 [IU] via SUBCUTANEOUS
  Filled 2013-10-25: qty 1

## 2013-10-25 MED ORDER — AMLODIPINE BESYLATE 5 MG PO TABS
5.0000 mg | ORAL_TABLET | Freq: Once | ORAL | Status: AC
  Administered 2013-10-25: 5 mg via ORAL
  Filled 2013-10-25: qty 1

## 2013-10-25 MED ORDER — FUROSEMIDE 20 MG PO TABS: ORAL_TABLET | ORAL | Status: DC

## 2013-10-25 MED ORDER — LORAZEPAM 1 MG PO TABS: 1.0000 mg | ORAL_TABLET | Freq: Three times a day (TID) | ORAL | Status: DC | PRN

## 2013-10-25 NOTE — ED Provider Notes (Signed)
CSN: [id number]     Arrival date & time 10/25/13  1807 History   First MD Initiated Contact with Patient 10/25/13 [place]     Chief Complaint  Patient presents with  . Medical Clearance     (Consider location/radiation/quality/duration/timing/severity/associated sxs/prior Treatment) HPI Comments: Patient is a 48 year old male past medical history significant for DM, hypertension, hyperlipidemia, spinal stenosis, neuropathy, polysubstance abuse presented to the emergency department from behavioral health for suicidal ideation with plan to use a gun tissue tonsils. Patient denies any HI but according to behavioral health [place] he was hostile and very threatening to them. Patient is a prior suicide attempt with cutting his wrist in the 6s. Patient is saying all of these things are he denies any self-harm, recreational drug use or alcohol use. Patient has IVC by Dr. Sabra Heck.    Past Medical History  Diagnosis Date  . Diabetes mellitus   . Hypertension   . Hyperlipidemia   . Meralgia paraesthetica 05/03/2012  . Lumbar spondylosis 05/03/2012  . Lumbar spinal stenosis 05/03/2012    Mild with only right L4 nerve root encroachment, no neurogenic claudication   . 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 Urinary incontinence 05/12/2012    Since the start of Sept. 2013   . Hemorrhoids 05/12/2012  . UTI (lower urinary tract infection)   . Meralgia paraesthetica 05/03/2012    On Lyrica which does improve pain.   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 Substance abuse   . Hyperlipidemia   . Pneumonia    Past Surgical History  Procedure Laterality Date  . Tonsillectomy    . Abdominal surgery      Abscess I&D 2/2 infected hair  . Colonoscopy w/ polypectomy      pt to bring records   Family History  Problem Relation Age of Onset  . Prostate cancer Father   . Alcohol abuse Father   . Emphysema Father     smoked   History  Substance Use Topics  . Smoking status: Current Every Day Smoker -- 0.50  packs/day for 30 years    Types: Cigarettes  . Smokeless tobacco: Never Used     Comment: "Trying - hard to stop"  . Alcohol Use: 3.6 oz/week    6 Cans of beer per week     Comment: beer    Review of Systems  Constitutional: Negative for fever and chills.  Respiratory: Negative for shortness of breath.   Cardiovascular: Negative for chest pain.  Gastrointestinal: Negative for nausea, vomiting, abdominal pain and diarrhea.  All other systems reviewed and are negative.      Allergies  Review of patient's allergies indicates no known allergies.  Home Medications   Current Outpatient Rx  Name  Route  Sig  Dispense  Refill  . albuterol (PROVENTIL HFA;VENTOLIN HFA) 108 (90 BASE) MCG/ACT inhaler   Inhalation   Inhale 2 puffs into the lungs every 4 (four) hours as needed for wheezing or shortness of breath.   1 Inhaler   6   . aspirin 81 MG chewable tablet   Oral   Chew 1 tablet (81 mg total) by mouth daily.   30 tablet   11   . furosemide (LASIX) 20 MG tablet      Take 2 daily until fluid gone then 1 daily   60 tablet   6   . insulin NPH (NOVOLIN N RELION) 100 UNIT/ML injection   Subcutaneous   Inject 35 Units into the skin 2 (two) times  daily.   30 mL   12     Please fill as Relion brand, or cheapest novolin N ...   . insulin regular (NOVOLIN R,HUMULIN R) 100 units/mL injection      Inject subcutaneous twice per day with your 2 largest meals, based on sliding scale provided by your doctor (1-18 units)   30 mL   3   . olmesartan (BENICAR) 40 MG tablet   Oral   Take 1 tablet (40 mg total) by mouth daily.          BP 190/112  Pulse 73  Temp(Src) 99.2 F (37.3 C) (Oral)  Resp 23  SpO2 95% Physical Exam  Constitutional: He is oriented to person, place, and time. He appears well-developed and well-nourished. No distress.  HENT:  Head: Normocephalic and atraumatic.  Right Ear: External ear normal.  Left Ear: External ear normal.  Nose: Nose normal.   Mouth/Throat: Oropharynx is clear and moist.  Eyes: Conjunctivae are normal.  Neck: Normal range of motion. Neck supple.  Cardiovascular: Normal rate, regular rhythm and normal heart sounds.   Pulmonary/Chest: Effort normal and breath sounds normal. No respiratory distress.  Abdominal: Soft. There is no tenderness.  Musculoskeletal: Normal range of motion.  Neurological: He is alert and oriented to person, place, and time.  Skin: Skin is warm and dry. He is not diaphoretic.  Psychiatric: He has a normal mood and affect. His speech is normal. He is not actively hallucinating. He expresses suicidal ideation. He expresses no homicidal ideation. He expresses suicidal plans. He expresses no homicidal plans.    ED Course  Procedures (including critical care time) Labs Review Labs Reviewed  COMPREHENSIVE METABOLIC PANEL - Abnormal; Notable for the following:    Glucose, Bld 222 (*)    Creatinine, Ser 1.44 (*)    Albumin 2.4 (*)    Total Bilirubin 0.2 (*)    GFR calc non Af Amer 56 (*)    GFR calc Af Amer 65 (*)    All other components within normal limits  SALICYLATE LEVEL - Abnormal; Notable for the following:    Salicylate Lvl 123456 (*)    All other components within normal limits  ACETAMINOPHEN LEVEL  CBC  ETHANOL  URINE RAPID DRUG SCREEN (HOSP PERFORMED)   Imaging Review Dg Chest 2 View  10/25/2013   CLINICAL DATA:  sob  EXAM: CHEST  2 VIEW  COMPARISON:  DG CHEST 2 VIEW dated 06/16/2013  FINDINGS: Low lung volumes. Cardiac silhouette is enlarged. An area of mild increased density projects within the medial aspect of the right lung base. There is blunting of the right and left costophrenic angles. No focal regions of consolidation appreciated. The osseous structures are unremarkable.  IMPRESSION: Atelectasis versus infiltrate right lung base. Trace effusions versus areas of chronic scarring. Stable cardiomegaly.   Electronically Signed   By: Margaree Mackintosh M.D.   On: 10/25/2013 10:58     EKG Interpretation   None       MDM   Final diagnoses:  Suicidal ideations   Filed Vitals:   10/25/13 1900  BP: 190/112  Pulse: 73  Temp: 99.2 F (37.3 C)  Resp: 23    Afebrile, NAD, non-toxic appearing, AAOx4.   Pt presents to the ED for medical clearance.  Pt is currently having SI ideations. Pt has a plan. Patient is currently denying all plans, but was very vocal to [facility] staff regarding his plans, stating if we discharged him he would go through  with his plan.   The patient currently does not have any acute physical complaints and is in no acute distress. The patients demeanor is inappropriate. The patient was brought to ED by [facility]. ACT consult was appreciated and pt was moved to Psych ED for further evaluation.        Harlow Mares, PA-C 10/25/13 2002

## 2013-10-25 NOTE — Assessment & Plan Note (Signed)
>   3 min discussion  I emphasized that although we never turn away smokers from the pulmonary clinic, we do ask that they understand that the recommendations that we make  won't work nearly as well in the presence of continued cigarette exposure, especially since he doesn't have the funds for meds when he's spending them on cigarettes  In fact, we may very well  reach a point where we can't promise to help the patient if he/she can't quit smoking. (We can and will promise to try to help, we just can't promise what we recommend will really work)

## 2013-10-25 NOTE — Progress Notes (Signed)
Quick Note:  ATC, Line busy ______

## 2013-10-25 NOTE — Assessment & Plan Note (Addendum)
-   Echo 01/30/13  EF 55% with mild LAE c/w diastolic dysfunction - PFT's 07/06/13  FEV1  3.03 (69%) ratio 80 and no change p B2,  dlco 86% - BNP 270 10/25/2013 with flare of hbp off meds assoc with sob   Symptoms are markedly disproportionate to objective findings and not clear this is a lung problem but pt does appear to have difficult airway management issues. DDX of  difficult airways managment all start with A and  include Adherence, Ace Inhibitors, Acid Reflux, Active Sinus Disease, Alpha 1 Antitripsin deficiency, Anxiety masquerading as Airways dz,  ABPA,  allergy(esp in young), Aspiration (esp in elderly), Adverse effects of DPI,  Active smokers, plus two Bs  = Bronchiectasis and Beta blocker use..and one C= CHF  Adherence is always the initial "prime suspect" and is a multilayered concern that requires a "trust but verify" approach in every patient - starting with knowing how to use medications, especially inhalers, correctly, keeping up with refills and understanding the fundamental difference between maintenance and prns vs those medications only taken for a very short course and then stopped and not refilled.  - desperately needs med reconciliation.  To keep things simple, I have asked the patient to first separate medicines that are perceived as maintenance, that is to be taken daily "no matter what", from those medicines that are taken on only on an as-needed basis and I have given the patient examples of both, and then return to see our NP to generate a  detailed  medication calendar which should be followed until the next physician sees the patient and updates it.   Active smoking > discussed separately   ACEi > better off so continue   CHF/ diastolic > rx hbp/vol overload   Anxiety > usually dx of exclusion but higher on list here.

## 2013-10-25 NOTE — ED Notes (Signed)
Notified Dr Aline Brochure and Charge Nurse Amie of elevated blood pressure, Manual 220/130 and automatic b/p 232/132.  Pending transfer to main ed.

## 2013-10-25 NOTE — Progress Notes (Signed)
B.Amiayah Giebel, MHT requested to completed placement search by contacting the following facilities listed below;  [facility] at capacity referral sent for review to wait list [facility] faxed [facility] at capacity referral faxed for review with expected discharges [facility] at capacity referral faxed for review  [facility] referral fax[facility] referral faxed [facility] referral faxed [facility] referral faxed [facility] referral faxed [facility] referral faxed  [facility] [employer] out of catchment area  [facility] at [employer] no outside referrals on tonight check back next day

## 2013-10-25 NOTE — ED Notes (Signed)
Pt has 2 belonging bags:  Bag 1: Blue jeans, brown belt, white socks, blue boxers, tan shirt, $133 cash (pt refused locking cash up with security, counted money with pt. Present and charted.) Jerry Cantrell  Bag 2: Black leather jacket.
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
## 2013-10-25 NOTE — Assessment & Plan Note (Signed)
Better off ACEi > see hbp, continue off acei

## 2013-10-25 NOTE — Telephone Encounter (Signed)
Attempted to call number given and not working- no other numbers listed for patient. Will need to wait for patient to call us back.   Results below:  Result Notes    Notes Recorded by Rosana Berger, CMA on 10/25/2013 at 1:29 PM ATC, line busy [facility] ------  Notes Recorded by Tanda Rockers, MD on 10/25/2013 at 1:23 PM Call pt: Reviewed cxr and no acute change so no change in recommendations made at ov    Notes Recorded by Rosana Berger, CMA on 10/25/2013 at 1:30 PM ATC, Line busy Notes Recorded by Tanda Rockers, MD on 10/25/2013 at 1:23 PM Call patient : Studies are unremarkable, no change in recs   Thanks

## 2013-10-25 NOTE — Progress Notes (Signed)
Subjective:    Patient ID: Jerry Cantrell, male    DOB: 16-Dec-1965  MRN: [id number]   Brief patient profile:  59 yowm active smoker  with doe x April 2014 with dx of pna at [facility] referred to Pulmonary clinic  09/15/2013  by Dr Monica Martinez at [facility] for sob   09/15/2013 1st [facility] office visit/ Jerry Cantrell on acei  Chief Complaint  Patient presents with  . Pulmonary Consult    Referred per Dr. Clayburn Pert.  Pt c/o SOB with or without exertion since had "walking pneumonia" April 2014.  He states gets SOB walking short distances and with even eating or taking a shower.  He also c/o occ chest tightness.  He has also noticed some diff with swallowing and "feels like throat closes up".   c/o sob x 100 yards on best days slow pace, flat grade, but sometimess sob  in shower and min better p proair, no better p rx with spiriva but not clear he took it correctly. rec Stop lisinopril  and spiriva Start benicar 40 mg one daily  GERD diet  10/25/2013 f/u ov/Jerry Cantrell re: f/u obesity/ pseudowheeze Chief Complaint  Patient presents with  . Follow-up    C/o SOB with all activty. BIL lower extremity and abdominal edema. Wheezing at night an with activity. Pt states he gasps for air even when resting.   Right > left leg swelling x 8 months eval by Dr Dr Eulas Post but no venous dopplers to date  No obvious day to day or daytime variabilty or assoc chronic cough or cp or chest tightness, subjective wheeze overt sinus or hb symptoms. No unusual exp hx or h/o childhood pna/ asthma or knowledge of premature birth.  Sleeping ok without nocturnal  or early am exacerbation  of respiratory  c/o's or need for noct saba. Also denies any obvious fluctuation of symptoms with weather or environmental changes or other aggravating or alleviating factors except as outlined above   Current Medications, Allergies, Complete Past Medical History, Past Surgical History, Family History, and Social History were reviewed in [employer] record.  ROS  The following are not active complaints unless bolded sore throat, dysphagia, dental problems, itching, sneezing,  nasal congestion or excess/ purulent secretions, ear ache,   fever, chills, sweats, unintended wt loss, pleuritic or exertional cp, hemoptysis,  orthopnea pnd or leg swelling, presyncope, palpitations, heartburn, abdominal pain, anorexia, nausea, vomiting, diarrhea  or change in bowel or urinary habits, change in stools or urine, dysuria,hematuria,  rash, arthralgias, visual complaints, headache, numbness weakness or ataxia or problems with walking or coordination,  change in mood/affect or memory.             Objective:   Physical Exam  amb obese wm nad/ bp noted   10/25/2013        308  Wt Readings from Last 3 Encounters:  09/15/13 298 lb 12.8 oz (135.535 kg)  09/08/13 302 lb 12.8 oz (137.349 kg)  08/09/13 309 lb 4.8 oz (140.298 kg)      HEENT: nl dentition,  and orophanx. Nl external ear canals without cough reflex Edentulous    NECK :  without JVD/Nodes/TM/ nl carotid upstrokes bilaterally   LUNGS: no acc muscle use, clear to A and P bilaterally without cough on insp or exp maneuvers   CV:  RRR  no s3 or murmur or increase in P2, R > L pitting  edema   ABD:  soft and nontender with nl  excursion in the supine position. No bruits or organomegaly, bowel sounds nl  MS:  warm without deformities, calf tenderness, cyanosis or clubbing  SKIN: warm and dry without lesions    NEURO:  alert, approp, no deficits       CXR  10/25/2013 : Atelectasis versus infiltrate right lung base. Trace effusions versus areas of chronic scarring. Stable cardiomegaly.   Labs 10/25/2013 ok except BNP 270 .       Assessment & Plan:

## 2013-10-25 NOTE — Progress Notes (Signed)
Quick Note:  ATC, line busy [facility] ______
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
## 2013-10-25 NOTE — ED Notes (Signed)
Bed: [id number] Expected date:  Expected time:  Means of arrival:  Comments: Room 42

## 2013-10-25 NOTE — ED Provider Notes (Signed)
Medical screening examination/treatment/procedure(s) were performed by non-physician practitioner and as supervising physician I was immediately available for consultation/collaboration.  Neta Ehlers, MD 10/25/13 [id number]

## 2013-10-25 NOTE — Patient Instructions (Addendum)
Bystolic 10 mg twice daily  benicar 40 mg daily Lasix 20 mg 2 daily until fluid better then reduce to one daily  Please see patient coordinator before you leave today  to schedule venous dopplers  Please remember to go to the lab and x-ray department downstairs for your tests - we will call you with the results when they are available.   See Tammy NP w/in 1 weeks with all your medications, even over the counter meds, separated in two separate bags, the ones you take no matter what vs the ones you stop once you feel better and take only as needed when you feel you need them.   Tammy  will generate for you a new user friendly medication calendar that will put [place] all on the same page re: your medication use.     Without this process, it simply isn't possible to assure that we are providing  your outpatient care  with  the attention to detail we feel you deserve.   If we cannot assure that you're getting that kind of care,  then we cannot manage your problem effectively from this clinic.  Once you have seen Tammy and we are sure that we're all on the same page with your medication use she will arrange follow up with me.

## 2013-10-25 NOTE — ED Notes (Signed)
Report to RN Rica Mote, main ed, Pending transfer to rm 4.
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
## 2013-10-26 ENCOUNTER — Encounter (HOSPITAL_COMMUNITY): Payer: Self-pay | Admitting: Emergency Medicine

## 2013-10-26 DIAGNOSIS — F4325 Adjustment disorder with mixed disturbance of emotions and conduct: Secondary | ICD-10-CM

## 2013-10-26 LAB — CBG MONITORING, ED
GLUCOSE-CAPILLARY: 46 mg/dL — AB (ref 70–99)
GLUCOSE-CAPILLARY: 66 mg/dL — AB (ref 70–99)
Glucose-Capillary: 157 mg/dL — ABNORMAL HIGH (ref 70–99)
Glucose-Capillary: 94 mg/dL (ref 70–99)
Glucose-Capillary: 191 mg/dL — ABNORMAL HIGH (ref 70–99)

## 2013-10-26 MED ORDER — DEXTROSE 50 % IV SOLN
1.0000 | Freq: Once | INTRAVENOUS | Status: AC
  Administered 2013-10-26: 50 mL via INTRAVENOUS
  Filled 2013-10-26: qty 50

## 2013-10-26 NOTE — Progress Notes (Signed)
Per RN, Patient accepted to [facility] for inpatient psych treatment. At this time patient is denying SI/HI and stating thing got blown out of proportion to patient nurse. RN contacted csw to discuss patient. At this time, patient awaiting to be seen by psychiatrist and NP for final decision.   CSW called [facility] to update Amy regarding pt pending disposition. Per Amy, patient had not been accepted or denied yet at this time and were calling to see if he still needed placement. Amy states she will have patient run by the MD for placement if needed.   Noreene Larsson [id number]  ED CSW 10/26/2013 [id number]
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
## 2013-10-26 NOTE — Progress Notes (Signed)
CSW met with the patient at bedside. CSW provided patient with discharge plans including outpatient recommendations and mobile crisis number for aftercare.  Patient acknowledged understanding of the plans and his ability to make appointments for outpatient services.  CSW spoke with Animal nutritionist and they will transport to his car at [facility] for safety.     Chesley Noon, MSW, LCSWA 10/26/2013 10:55 AM   Belia Heman, Marlinda Mike [phone]  ED CSW 10/26/2013 10:56 AM

## 2013-10-26 NOTE — Consult Note (Signed)
  Review of Systems  Constitutional: Negative.   HENT: Negative.   Eyes: Positive for blurred vision.  Respiratory: Negative.   Cardiovascular: Positive for leg swelling.  Gastrointestinal: Negative.   Genitourinary: Negative.   Musculoskeletal: Positive for joint pain and myalgias.  Skin: Negative.   Neurological: Negative.   Psychiatric/Behavioral: Positive for depression.   Has neuropathy of his legs which always hurts

## 2013-10-26 NOTE — ED Notes (Addendum)
Pt reports "pain all the time" in legs/feet. Unchanged pain response to pain medication administration. Pt denies SI/HI.

## 2013-10-26 NOTE — ED Notes (Signed)
Pt denies SI/HI. Pt states "Things got blown out of proportion last night. They asked if I wanted to hurt myself. Everyone feels like that every now any then. It was not like I was going to right then and there. I just needed someone to talk to." TTS team notified and reports will come consult/speak with pt this am.

## 2013-10-26 NOTE — ED Notes (Signed)
MEAL GIVEN. Hold insulin. Pt CBG 94. Will recheck CBG after meal.

## 2013-10-26 NOTE — ED Notes (Signed)
Put pt's belonging (2 plastic bags) in locker 33.

## 2013-10-26 NOTE — [facility] Suicide Risk Assessment (Signed)
Suicide Risk Assessment  Discharge Assessment     Demographic Factors:  Male and Caucasian  Total Time spent with patient: 1 hour  Psychiatric Specialty Exam:     Blood pressure 212/98, pulse 69, temperature 98 F (36.7 C), temperature source Oral, resp. rate 18, SpO2 98.00%.There is no weight on file to calculate BMI.  General Appearance: Casual  Eye Contact::  Good  Speech:  Clear and Coherent  Volume:  Normal  Mood:  Anxious  Affect:  Appropriate  Thought Process:  Coherent and Logical  Orientation:  Full (Time, Place, and Person)  Thought Content:  Negative  Suicidal Thoughts:  No  Homicidal Thoughts:  No  Memory:  Immediate;   Good Recent;   Good Remote;   Good  Judgement:  Good  Insight:  Good  Psychomotor Activity:  Normal  Concentration:  Good  Recall:  Good  Fund of Knowledge:Good  Language: Good  Akathisia:  Negative  Handed:  Right  AIMS (if indicated):     Assets:  Communication Skills Desire for Improvement Financial Resources/Insurance Housing Intimacy Leisure Time Resilience Social Support Talents/Skills Transportation Vocational/Educational  Sleep:       Musculoskeletal: Strength & Muscle Tone: within normal limits Gait & Station: normal Patient leans: N/A   Mental Status Per Nursing Assessment::   On Admission:     Current Mental Status by Physician: NA  Loss Factors: Decrease in vocational status, Decline in physical health and Financial problems/change in socioeconomic status  Historical Factors: Prior suicide attempts  Risk Reduction Factors:   Sense of responsibility to family, Employed, Living with another person, especially a relative, Positive social support and Positive coping skills or problem solving skills  Continued Clinical Symptoms:  Previous Psychiatric Diagnoses and Treatments  Cognitive Features That Contribute To Risk:  none    Suicide Risk:  Minimal: No identifiable suicidal ideation.  Patients  presenting with no risk factors but with morbid ruminations; may be classified as minimal risk based on the severity of the depressive symptoms  Discharge Diagnoses:   AXIS I:  Adjustment Disorder with Disturbance of Conduct AXIS II:  Deferred AXIS III:   Past Medical History  Diagnosis Date  . Hypertension   . Hyperlipidemia   . Meralgia paraesthetica 05/03/2012  . Lumbar spondylosis 05/03/2012  . Lumbar spinal stenosis 05/03/2012    Mild with only right L4 nerve root encroachment, no neurogenic claudication   . Urinary incontinence 05/12/2012    Since the start of Sept. 2013   . Hemorrhoids 05/12/2012  . UTI (lower urinary tract infection)   . Meralgia paraesthetica 05/03/2012    On Lyrica which does improve pain.   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 Substance abuse   . Hyperlipidemia   . Pneumonia   . Diabetes mellitus   . Diabetes mellitus with nephropathy 05/12/2012   AXIS IV:  economic problems and health issues AXIS V:  61-70 mild symptoms  Plan Of Care/Follow-up recommendations:  Activity:  resume usual activity Diet:  resume usual activity  Is patient on multiple antipsychotic therapies at discharge:  No   Has Patient had three or more failed trials of antipsychotic monotherapy by history:  No  Recommended Plan for Multiple Antipsychotic Therapies: NA    TAYLOR,GERALD D 10/26/2013, 10:39 AM

## 2013-10-27 NOTE — Telephone Encounter (Signed)
Called number provided and msg states call did not go through  Will close since I have his results in my basket and will continue to try and reach him

## 2013-10-30 ENCOUNTER — Ambulatory Visit (INDEPENDENT_AMBULATORY_CARE_PROVIDER_SITE_OTHER): Payer: BC Managed Care – PPO | Admitting: Internal Medicine

## 2013-10-30 ENCOUNTER — Encounter: Payer: Self-pay | Admitting: *Deleted

## 2013-10-30 ENCOUNTER — Encounter: Payer: Self-pay | Admitting: Internal Medicine

## 2013-10-30 VITALS — BP 196/122 | HR 66 | Temp 98.2°F | Ht 73.0 in | Wt 310.0 lb

## 2013-10-30 DIAGNOSIS — R0989 Other specified symptoms and signs involving the circulatory and respiratory systems: Secondary | ICD-10-CM

## 2013-10-30 DIAGNOSIS — R0609 Other forms of dyspnea: Secondary | ICD-10-CM

## 2013-10-30 DIAGNOSIS — R0600 Dyspnea, unspecified: Secondary | ICD-10-CM

## 2013-10-30 MED ORDER — NEBIVOLOL HCL 10 MG PO TABS: ORAL_TABLET | ORAL | Status: DC

## 2013-10-30 MED ORDER — OLMESARTAN-AMLODIPINE-HCTZ 40-10-25 MG PO TABS: ORAL_TABLET | ORAL | Status: DC

## 2013-10-30 NOTE — Progress Notes (Signed)
Quick Note:  Pt aware ______ 

## 2013-10-30 NOTE — Progress Notes (Signed)
Subjective:    Patient ID: Jerry Cantrell, male    DOB: 08/11/1966  MRN: [id number]   Brief patient profile:  49 yowm active smoker  with doe x April 2014 with dx of pna at [facility] referred to Pulmonary clinic  09/15/2013  by Dr Monica Martinez at [facility] for sob  History of Present Illness  09/15/2013 1st [place] [facility] office visit/ Wert on acei  Chief Complaint  Patient presents with  . Pulmonary Consult    Referred per Dr. Clayburn Pert.  Pt c/o SOB with or without exertion since had "walking pneumonia" April 2014.  He states gets SOB walking short distances and with even eating or taking a shower.  He also c/o occ chest tightness.  He has also noticed some diff with swallowing and "feels like throat closes up".   c/o sob x 100 yards on best days slow pace, flat grade, but sometimess sob  in shower and min better p proair, no better p rx with spiriva but not clear he took it correctly. rec Stop lisinopril  and spiriva Start benicar 40 mg one daily  GERD diet  10/25/2013 f/u ov/Wert re: f/u obesity/ pseudowheeze Chief Complaint  Patient presents with  . Follow-up    C/o SOB with all activty. BIL lower extremity and abdominal edema. Wheezing at night an with activity. Pt states he gasps for air even when resting.   Right > left leg swelling x 8 months eval by Dr Dr Eulas Post but no venous dopplers to date rec Bystolic 10 mg twice daily  benicar 40 mg daily Lasix 20 mg 2 daily until fluid better then reduce to one daily Please see patient coordinator before you leave today  to schedule venous dopplers   10/30/2013 f/u ov/Wert re: unexplained sob/hbp/  Surreptitious cocaine  Use  / ? Ethohism/ smoker Chief Complaint  Patient presents with  . Follow-up    C/o breathing getting worse, pt states "i cant breath". Increase in SOB. Pt states he is unable to do daily activities without getting SOB. CP and wheezing with activity.      Fits of sob at rest, resolve spont p 3 min, just as likely to happen sitting  as walking  No better with albuterol.   No obvious day to day or daytime variabilty or assoc chronic cough or cp or chest tightness, subjective wheeze overt sinus or hb symptoms. No unusual exp hx or h/o childhood pna/ asthma or knowledge of premature birth.  Sleeping ok without nocturnal  or early am exacerbation  of respiratory  c/o's or need for noct saba. Also denies any obvious fluctuation of symptoms with weather or environmental changes or other aggravating or alleviating factors except as outlined above   Current Medications, Allergies, Complete Past Medical History, Past Surgical History, Family History, and Social History were reviewed in [employer] record.  ROS  The following are not active complaints unless bolded sore throat, dysphagia, dental problems, itching, sneezing,  nasal congestion or excess/ purulent secretions, ear ache,   fever, chills, sweats, unintended wt loss, pleuritic or exertional cp, hemoptysis,  orthopnea pnd or leg swelling resolved , presyncope, palpitations, heartburn, abdominal pain, anorexia, nausea, vomiting, diarrhea  or change in bowel or urinary habits, change in stools or urine, dysuria,hematuria,  rash, arthralgias, visual complaints, headache, numbness weakness or ataxia or problems with walking or coordination,  change in mood/affect or memory.             Objective:  Physical Exam  amb obese wm nad/ bp noted   10/25/2013        308  Vs 10/30/2013  310  Wt Readings from Last 3 Encounters:  09/15/13 298 lb 12.8 oz (135.535 kg)  09/08/13 302 lb 12.8 oz (137.349 kg)  08/09/13 309 lb 4.8 oz (140.298 kg)      HEENT: nl dentition,  and orophanx. Nl external ear canals without cough reflex Edentulous    NECK :  without JVD/Nodes/TM/ nl carotid upstrokes bilaterally   LUNGS: no acc muscle use, clear to A and P bilaterally without cough on insp or exp maneuvers   CV:  RRR  no s3 or murmur or increase in P2, R > L  pitting  edema   ABD:  soft and nontender with nl excursion in the supine position. No bruits or organomegaly, bowel sounds nl  MS:  warm without deformities, calf tenderness, cyanosis or clubbing  SKIN: warm and dry without lesions    NEURO:  alert, approp, no deficits       CXR  10/25/2013 : Atelectasis versus infiltrate right lung base. Trace effusions versus areas of chronic scarring. Stable cardiomegaly.   Labs 10/25/2013 ok except BNP 270  Labs 10/25/13 in ER POS COCAINE         Assessment & Plan:

## 2013-10-30 NOTE — Assessment & Plan Note (Signed)
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

## 2013-10-30 NOTE — Patient Instructions (Addendum)
Start tribenzor 40/10/25 one daily  Continue bystolic 10 mg but take 2 every 12 hours Continue lasix 20 mg just one daily   The key is to stop smoking completely before smoking completely stops you!   See  Your primary care doctor before your samples run out

## 2013-11-02 ENCOUNTER — Encounter (HOSPITAL_COMMUNITY): Payer: Self-pay

## 2013-11-03 ENCOUNTER — Encounter: Payer: Self-pay | Admitting: Adult Health

## 2013-11-15 ENCOUNTER — Telehealth: Payer: Self-pay | Admitting: Licensed Clinical Social Worker

## 2013-11-15 NOTE — Telephone Encounter (Signed)
CSW placed called to pt.  CSW left message requesting return call. CSW provided contact hours and phone number.  CSW left message requesting return call to confirm address.  There are two different addresses on pt's medication assistance program forms.  CSW will to confirm address as this will be where medication is mailed.

## 2013-11-16 NOTE — Telephone Encounter (Signed)
CSW placed called to pt.  CSW left message requesting return call. CSW provided contact hours and phone number. 

## 2013-11-16 NOTE — Telephone Encounter (Signed)
Mr. Gustafson returned call to [place].  Pt confirmed [place] address is correct.  Mr. Burget began to explain that he has not been taking his medication due to lack of funds.  Pt states he bought insulin off the street.  Mr. Valera states he purchased a couple of "water pills".  Mr. Uselman inquired with CSW regarding if he had refills left and if Dr. Melvyn Novas actually sent in a prescription.  CSW referred Mr. Coole to nursing staff to answer specific questions regarding medications.  Pt requested to be transferred to front office for scheduling.  Pt states he was to follow up in one month and needs appointment.  Call transferred to front office, pt aware he may need to call back if phone lines are still on lunch mode.  Pt aware CSW will fax in prescription assistance paperwork now that address has been confirmed.

## 2013-11-17 NOTE — Telephone Encounter (Signed)
Lyrica PAP outstanding.  Pt needs to sign off on application and gov't issued photo id need to be copied.  CSW mailed letter to patient with request.  Ultram PAP and Inhaler PAP faxed/mailed today.

## 2013-11-28 ENCOUNTER — Inpatient Hospital Stay (HOSPITAL_COMMUNITY)
Admission: EM | Admit: 2013-11-28 | Discharge: 2013-12-04 | DRG: 304 | Disposition: A | Discharge status: Home / Self Care | Payer: BC Managed Care – PPO | Attending: Internal Medicine | Admitting: Internal Medicine

## 2013-11-28 ENCOUNTER — Encounter (HOSPITAL_COMMUNITY): Payer: Self-pay | Admitting: Emergency Medicine

## 2013-11-28 ENCOUNTER — Emergency Department (HOSPITAL_COMMUNITY): Payer: BC Managed Care – PPO

## 2013-11-28 DIAGNOSIS — Z79899 Other long term (current) drug therapy: Secondary | ICD-10-CM

## 2013-11-28 DIAGNOSIS — R269 Unspecified abnormalities of gait and mobility: Secondary | ICD-10-CM

## 2013-11-28 DIAGNOSIS — E1149 Type 2 diabetes mellitus with other diabetic neurological complication: Secondary | ICD-10-CM

## 2013-11-28 DIAGNOSIS — Z9181 History of falling: Secondary | ICD-10-CM

## 2013-11-28 DIAGNOSIS — M7989 Other specified soft tissue disorders: Secondary | ICD-10-CM | POA: Diagnosis present

## 2013-11-28 DIAGNOSIS — E781 Pure hyperglyceridemia: Secondary | ICD-10-CM | POA: Diagnosis present

## 2013-11-28 DIAGNOSIS — M545 Low back pain, unspecified: Secondary | ICD-10-CM

## 2013-11-28 DIAGNOSIS — E7800 Pure hypercholesterolemia, unspecified: Secondary | ICD-10-CM | POA: Diagnosis present

## 2013-11-28 DIAGNOSIS — I10 Essential (primary) hypertension: Principal | ICD-10-CM

## 2013-11-28 DIAGNOSIS — Z91199 Patient's noncompliance with other medical treatment and regimen due to unspecified reason: Secondary | ICD-10-CM

## 2013-11-28 DIAGNOSIS — I5030 Unspecified diastolic (congestive) heart failure: Secondary | ICD-10-CM

## 2013-11-28 DIAGNOSIS — F3289 Other specified depressive episodes: Secondary | ICD-10-CM

## 2013-11-28 DIAGNOSIS — E8770 Fluid overload, unspecified: Secondary | ICD-10-CM | POA: Diagnosis present

## 2013-11-28 DIAGNOSIS — Z794 Long term (current) use of insulin: Secondary | ICD-10-CM

## 2013-11-28 DIAGNOSIS — R809 Proteinuria, unspecified: Secondary | ICD-10-CM | POA: Diagnosis present

## 2013-11-28 DIAGNOSIS — W19XXXA Unspecified fall, initial encounter: Secondary | ICD-10-CM | POA: Diagnosis present

## 2013-11-28 DIAGNOSIS — I160 Hypertensive urgency: Secondary | ICD-10-CM

## 2013-11-28 DIAGNOSIS — E785 Hyperlipidemia, unspecified: Secondary | ICD-10-CM

## 2013-11-28 DIAGNOSIS — M5410 Radiculopathy, site unspecified: Secondary | ICD-10-CM | POA: Diagnosis present

## 2013-11-28 DIAGNOSIS — F329 Major depressive disorder, single episode, unspecified: Secondary | ICD-10-CM

## 2013-11-28 DIAGNOSIS — G4733 Obstructive sleep apnea (adult) (pediatric): Secondary | ICD-10-CM

## 2013-11-28 DIAGNOSIS — M47817 Spondylosis without myelopathy or radiculopathy, lumbosacral region: Secondary | ICD-10-CM | POA: Diagnosis present

## 2013-11-28 DIAGNOSIS — I110 Hypertensive heart disease with heart failure: Secondary | ICD-10-CM | POA: Diagnosis present

## 2013-11-28 DIAGNOSIS — Z6838 Body mass index (BMI) 38.0-38.9, adult: Secondary | ICD-10-CM

## 2013-11-28 DIAGNOSIS — J9801 Acute bronchospasm: Secondary | ICD-10-CM | POA: Diagnosis present

## 2013-11-28 DIAGNOSIS — I5031 Acute diastolic (congestive) heart failure: Secondary | ICD-10-CM | POA: Diagnosis present

## 2013-11-28 DIAGNOSIS — IMO0002 Reserved for concepts with insufficient information to code with codable children: Secondary | ICD-10-CM

## 2013-11-28 DIAGNOSIS — E119 Type 2 diabetes mellitus without complications: Secondary | ICD-10-CM

## 2013-11-28 DIAGNOSIS — E1129 Type 2 diabetes mellitus with other diabetic kidney complication: Secondary | ICD-10-CM | POA: Diagnosis present

## 2013-11-28 DIAGNOSIS — D696 Thrombocytopenia, unspecified: Secondary | ICD-10-CM

## 2013-11-28 DIAGNOSIS — Z7982 Long term (current) use of aspirin: Secondary | ICD-10-CM

## 2013-11-28 DIAGNOSIS — Z9119 Patient's noncompliance with other medical treatment and regimen: Secondary | ICD-10-CM

## 2013-11-28 DIAGNOSIS — M79609 Pain in unspecified limb: Secondary | ICD-10-CM | POA: Diagnosis present

## 2013-11-28 DIAGNOSIS — R51 Headache: Secondary | ICD-10-CM | POA: Diagnosis present

## 2013-11-28 DIAGNOSIS — E1142 Type 2 diabetes mellitus with diabetic polyneuropathy: Secondary | ICD-10-CM | POA: Diagnosis present

## 2013-11-28 DIAGNOSIS — I509 Heart failure, unspecified: Secondary | ICD-10-CM

## 2013-11-28 DIAGNOSIS — F17200 Nicotine dependence, unspecified, uncomplicated: Secondary | ICD-10-CM

## 2013-11-28 DIAGNOSIS — I5033 Acute on chronic diastolic (congestive) heart failure: Secondary | ICD-10-CM | POA: Diagnosis present

## 2013-11-28 DIAGNOSIS — F1410 Cocaine abuse, uncomplicated: Secondary | ICD-10-CM | POA: Diagnosis present

## 2013-11-28 DIAGNOSIS — E669 Obesity, unspecified: Secondary | ICD-10-CM | POA: Diagnosis present

## 2013-11-28 DIAGNOSIS — F32A Depression, unspecified: Secondary | ICD-10-CM

## 2013-11-28 DIAGNOSIS — I441 Atrioventricular block, second degree: Secondary | ICD-10-CM | POA: Diagnosis present

## 2013-11-28 DIAGNOSIS — E8779 Other fluid overload: Secondary | ICD-10-CM

## 2013-11-28 DIAGNOSIS — I161 Hypertensive emergency: Secondary | ICD-10-CM | POA: Diagnosis present

## 2013-11-28 DIAGNOSIS — N179 Acute kidney failure, unspecified: Secondary | ICD-10-CM | POA: Diagnosis present

## 2013-11-28 DIAGNOSIS — G8929 Other chronic pain: Secondary | ICD-10-CM | POA: Diagnosis present

## 2013-11-28 HISTORY — DX: Acute diastolic (congestive) heart failure: I50.31

## 2013-11-28 LAB — URINALYSIS, ROUTINE W REFLEX MICROSCOPIC
Bilirubin Urine: NEGATIVE
Glucose, UA: NEGATIVE mg/dL
KETONES UR: NEGATIVE mg/dL
Leukocytes, UA: NEGATIVE
Nitrite: NEGATIVE
PH: 6 (ref 5.0–8.0)
Protein, ur: 100 mg/dL — AB
SPECIFIC GRAVITY, URINE: 1.01 (ref 1.005–1.030)
Urobilinogen, UA: 0.2 mg/dL (ref 0.0–1.0)

## 2013-11-28 LAB — BASIC METABOLIC PANEL
BUN: 20 mg/dL (ref 6–23)
CHLORIDE: 100 meq/L (ref 96–112)
CO2: 30 mEq/L (ref 19–32)
Calcium: 8.7 mg/dL (ref 8.4–10.5)
Creatinine, Ser: 1.25 mg/dL (ref 0.50–1.35)
GFR, EST AFRICAN AMERICAN: 77 mL/min — AB (ref >90)
GFR, EST NON AFRICAN AMERICAN: 67 mL/min — AB (ref >90)
Glucose, Bld: 261 mg/dL — ABNORMAL HIGH (ref 70–99)
POTASSIUM: 4.1 meq/L (ref 3.7–5.3)
SODIUM: 140 meq/L (ref 137–147)

## 2013-11-28 LAB — URINE MICROSCOPIC-ADD ON

## 2013-11-28 LAB — RAPID URINE DRUG SCREEN, HOSP PERFORMED
Amphetamines: NOT DETECTED
Barbiturates: NOT DETECTED
Benzodiazepines: NOT DETECTED
Cocaine: NOT DETECTED
Opiates: NOT DETECTED
Tetrahydrocannabinol: NOT DETECTED

## 2013-11-28 LAB — CBC
HCT: 53.3 % — ABNORMAL HIGH (ref 39.0–52.0)
Hemoglobin: 18.5 g/dL — ABNORMAL HIGH (ref 13.0–17.0)
MCH: 31.5 pg (ref 26.0–34.0)
MCHC: 34.7 g/dL (ref 30.0–36.0)
MCV: 90.6 fL (ref 78.0–100.0)
Platelets: 122 10*3/uL — ABNORMAL LOW (ref 150–400)
RBC: 5.88 MIL/uL — ABNORMAL HIGH (ref 4.22–5.81)
RDW: 13.7 % (ref 11.5–15.5)
WBC: 8.1 10*3/uL (ref 4.0–10.5)

## 2013-11-28 LAB — PRO B NATRIURETIC PEPTIDE: PRO B NATRI PEPTIDE: 4116 pg/mL — AB (ref 0–125)

## 2013-11-28 LAB — TROPONIN I: Troponin I: <0.3 ng/mL (ref <0.30)

## 2013-11-28 LAB — I-STAT TROPONIN, ED: TROPONIN I, POC: 0.02 ng/mL (ref 0.00–0.08)

## 2013-11-28 MED ORDER — NITROGLYCERIN IN D5W 200-5 MCG/ML-% IV SOLN: 5.0000 ug/min | Freq: Once | INTRAVENOUS | Status: DC

## 2013-11-28 MED ORDER — FUROSEMIDE 10 MG/ML IJ SOLN
60.0000 mg | Freq: Once | INTRAMUSCULAR | Status: AC
  Administered 2013-11-28: 60 mg via INTRAVENOUS
  Filled 2013-11-28: qty 6

## 2013-11-28 MED ORDER — CARVEDILOL 25 MG PO TABS
25.0000 mg | ORAL_TABLET | Freq: Two times a day (BID) | ORAL | Status: DC
Start: 2013-11-29 — End: 2013-11-29
  Filled 2013-11-28 (×3): qty 1

## 2013-11-28 MED ORDER — HEPARIN SODIUM (PORCINE) 5000 UNIT/ML IJ SOLN
5000.0000 [IU] | Freq: Three times a day (TID) | INTRAMUSCULAR | Status: DC
  Administered 2013-11-28: 5000 [IU] via SUBCUTANEOUS
  Filled 2013-11-28 (×2): qty 1

## 2013-11-28 MED ORDER — FUROSEMIDE 10 MG/ML IJ SOLN
20.0000 mg | Freq: Once | INTRAMUSCULAR | Status: AC
  Administered 2013-11-29: 20 mg via INTRAVENOUS
  Filled 2013-11-28: qty 2

## 2013-11-28 MED ORDER — NITROGLYCERIN IN D5W 200-5 MCG/ML-% IV SOLN: 5.0000 ug/min | INTRAVENOUS | Status: DC

## 2013-11-28 MED ORDER — NITROGLYCERIN IN D5W 200-5 MCG/ML-% IV SOLN
5.0000 ug/min | Freq: Once | INTRAVENOUS | Status: AC
  Administered 2013-11-28: 5 ug/min via INTRAVENOUS
  Filled 2013-11-28: qty 250

## 2013-11-28 MED ORDER — SODIUM CHLORIDE 0.9 % IJ SOLN
3.0000 mL | Freq: Two times a day (BID) | INTRAMUSCULAR | Status: DC
  Administered 2013-11-29: 22:00:00 via INTRAVENOUS
  Administered 2013-11-29 – 2013-12-04 (×10): 3 mL via INTRAVENOUS

## 2013-11-28 MED ORDER — INSULIN ASPART 100 UNIT/ML ~~LOC~~ SOLN
0.0000 [IU] | Freq: Three times a day (TID) | SUBCUTANEOUS | Status: DC
Start: 2013-11-29 — End: 2013-12-04
  Administered 2013-11-29: 15 [IU] via SUBCUTANEOUS
  Administered 2013-11-29: 20 [IU] via SUBCUTANEOUS
  Administered 2013-11-29: 4 [IU] via SUBCUTANEOUS
  Administered 2013-11-30: 20 [IU] via SUBCUTANEOUS
  Administered 2013-11-30: 7 [IU] via SUBCUTANEOUS
  Administered 2013-11-30: 4 [IU] via SUBCUTANEOUS
  Administered 2013-12-01: 7 [IU] via SUBCUTANEOUS
  Administered 2013-12-01 – 2013-12-02 (×3): 4 [IU] via SUBCUTANEOUS
  Administered 2013-12-02: 7 [IU] via SUBCUTANEOUS
  Administered 2013-12-02: 4 [IU] via SUBCUTANEOUS
  Administered 2013-12-03 (×2): 3 [IU] via SUBCUTANEOUS
  Administered 2013-12-04: 11 [IU] via SUBCUTANEOUS

## 2013-11-28 MED ORDER — FUROSEMIDE 10 MG/ML IJ SOLN
60.0000 mg | Freq: Every day | INTRAMUSCULAR | Status: DC
  Administered 2013-11-29 – 2013-11-30 (×2): 60 mg via INTRAVENOUS
  Filled 2013-11-28 (×2): qty 6

## 2013-11-28 MED ORDER — OXYCODONE-ACETAMINOPHEN 5-325 MG PO TABS
2.0000 | ORAL_TABLET | Freq: Once | ORAL | Status: AC
  Administered 2013-11-28: 2 via ORAL
  Filled 2013-11-28: qty 2

## 2013-11-28 MED ORDER — ALBUTEROL SULFATE (2.5 MG/3ML) 0.083% IN NEBU: 3.0000 mL | INHALATION_SOLUTION | RESPIRATORY_TRACT | Status: DC | PRN

## 2013-11-28 MED ORDER — INSULIN GLARGINE 100 UNIT/ML ~~LOC~~ SOLN
15.0000 [IU] | Freq: Two times a day (BID) | SUBCUTANEOUS | Status: DC
  Administered 2013-11-28 – 2013-11-29 (×2): 15 [IU] via SUBCUTANEOUS
  Filled 2013-11-28 (×3): qty 0.15

## 2013-11-28 MED ORDER — IRBESARTAN 75 MG PO TABS
75.0000 mg | ORAL_TABLET | Freq: Every day | ORAL | Status: DC
  Filled 2013-11-28: qty 1

## 2013-11-28 MED ORDER — NICARDIPINE HCL IN NACL 20-0.86 MG/200ML-% IV SOLN
3.0000 mg/h | Freq: Once | INTRAVENOUS | Status: DC
  Filled 2013-11-28: qty 200

## 2013-11-28 MED ORDER — OXYCODONE-ACETAMINOPHEN 5-325 MG PO TABS
1.0000 | ORAL_TABLET | Freq: Four times a day (QID) | ORAL | Status: DC | PRN
  Administered 2013-11-29 – 2013-12-01 (×5): 2 via ORAL
  Filled 2013-11-28 (×5): qty 2

## 2013-11-28 MED ORDER — ASPIRIN 81 MG PO CHEW
81.0000 mg | CHEWABLE_TABLET | Freq: Every day | ORAL | Status: DC
  Administered 2013-11-29 – 2013-12-04 (×6): 81 mg via ORAL
  Filled 2013-11-28 (×6): qty 1

## 2013-11-28 MED ORDER — INSULIN ASPART 100 UNIT/ML ~~LOC~~ SOLN
0.0000 [IU] | Freq: Every day | SUBCUTANEOUS | Status: DC
  Administered 2013-11-28: 5 [IU] via SUBCUTANEOUS
  Administered 2013-11-30: 2 [IU] via SUBCUTANEOUS
  Administered 2013-12-02: 5 [IU] via SUBCUTANEOUS

## 2013-11-28 NOTE — ED Provider Notes (Addendum)
CSN: [id number]     Arrival date & time 11/28/13  1022 History   First MD Initiated Contact with Patient 11/28/13 1106     Chief Complaint  Patient presents with  . Shortness of Breath     (Consider location/radiation/quality/duration/timing/severity/associated sxs/prior Treatment) Patient is a 48 y.o. male presenting with shortness of breath. The history is provided by the patient.  Shortness of Breath  patient here complaining of worsening dyspnea on the exertion. He denies any chest pain. Denies any orthopnea but does note some lower extremity edema. Has been profoundly hypertensive and states that he has been compliant with his medications. Denies any anginal type chest pain. No fever or productive cough. Symptoms have been persistent. He states he's been compliant with his medications. He denies any history of CHF. Patient denies any black or bloody stools. No treatment used prior to arrival.  Past Medical History  Diagnosis Date  . Hypertension   . Hyperlipidemia   . Meralgia paraesthetica 05/03/2012  . Lumbar spondylosis 05/03/2012  . Lumbar spinal stenosis 05/03/2012    Mild with only right L4 nerve root encroachment, no neurogenic claudication   . Urinary incontinence 05/12/2012    Since the start of Sept. 2013   . Hemorrhoids 05/12/2012  . UTI (lower urinary tract infection)   . Meralgia paraesthetica 05/03/2012    On Lyrica which does improve pain.   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 Substance abuse   . Hyperlipidemia   . Pneumonia   . Diabetes mellitus   . Diabetes mellitus with nephropathy 05/12/2012   Past Surgical History  Procedure Laterality Date  . Tonsillectomy    . Abdominal surgery      Abscess I&D 2/2 infected hair  . Colonoscopy w/ polypectomy      pt to bring records   Family History  Problem Relation Age of Onset  . Prostate cancer Father   . Alcohol abuse Father   . Emphysema Father     smoked   History  Substance Use Topics  . Smoking status:  Current Every Day Smoker -- 0.50 packs/day for 30 years    Types: Cigarettes  . Smokeless tobacco: Never Used     Comment: "Trying - hard to stop"  . Alcohol Use: 3.6 oz/week    6 Cans of beer per week     Comment: beer    Review of Systems  Respiratory: Positive for shortness of breath.   All other systems reviewed and are negative.      Allergies  Review of patient's allergies indicates no known allergies.  Home Medications   Current Outpatient Rx  Name  Route  Sig  Dispense  Refill  . albuterol (PROVENTIL HFA;VENTOLIN HFA) 108 (90 BASE) MCG/ACT inhaler   Inhalation   Inhale 2 puffs into the lungs every 4 (four) hours as needed for wheezing or shortness of breath.   1 Inhaler   6   . aspirin 81 MG chewable tablet   Oral   Chew 1 tablet (81 mg total) by mouth daily.   30 tablet   11   . furosemide (LASIX) 20 MG tablet      Take 2 daily until fluid gone then 1 daily   60 tablet   6   . insulin NPH (NOVOLIN N RELION) 100 UNIT/ML injection   Subcutaneous   Inject 35 Units into the skin 2 (two) times daily.   30 mL   12     Please fill as  Relion brand, or cheapest novolin N ...   . insulin regular (NOVOLIN R,HUMULIN R) 100 units/mL injection      Inject subcutaneous twice per day with your 2 largest meals, based on sliding scale provided by your doctor (1-18 units)   30 mL   3   . nebivolol (BYSTOLIC) 10 MG tablet      Take 2 every 12 hours         . Olmesartan-Amlodipine-HCTZ (TRIBENZOR) 40-10-25 MG TABS      One daily          BP 221/130  Pulse 73  Temp(Src) 98.7 F (37.1 C) (Oral)  SpO2 94% Physical Exam  Nursing note and vitals reviewed. Constitutional: He is oriented to person, place, and time. He appears well-developed and well-nourished.  Non-toxic appearance. No distress.  HENT:  Head: Normocephalic and atraumatic.  Eyes: Conjunctivae, EOM and lids are normal. Pupils are equal, round, and reactive to light.  Neck: Normal range of  motion. Neck supple. No tracheal deviation present. No mass present.  Cardiovascular: Normal rate, regular rhythm and normal heart sounds.  Exam reveals no gallop.   No murmur heard. Pulmonary/Chest: Effort normal and breath sounds normal. No stridor. No respiratory distress. He has no decreased breath sounds. He has no wheezes. He has no rhonchi. He has no rales.  Abdominal: Soft. Normal appearance and bowel sounds are normal. He exhibits no distension. There is no tenderness. There is no rebound and no CVA tenderness.  Musculoskeletal: Normal range of motion. He exhibits no edema and no tenderness.  2+ bilateral lower extremity pitting edema  Neurological: He is alert and oriented to person, place, and time. He has normal strength. No cranial nerve deficit or sensory deficit. GCS eye subscore is 4. GCS verbal subscore is 5. GCS motor subscore is 6.  Skin: Skin is warm and dry. No abrasion and no rash noted.  Psychiatric: He has a normal mood and affect. His speech is normal and behavior is normal.    ED Course  Procedures (including critical care time) Labs Review Labs Reviewed  [place], ED   Imaging Review No results found.   EKG Interpretation   Date/Time:  Tuesday November 28 2013 10:26:08 EDT Ventricular Rate:  76 PR Interval:  176 QRS Duration: 108 QT Interval:  430 QTC Calculation: 483 R Axis:   117 Text Interpretation:  Normal sinus rhythm Slow R Progression, Noted  01-28-2013 T wave abnormality, consider inferior ischemia Abnormal ECG  Confirmed by Jeneen Rinks  MD, [place] ([id number]) on 11/28/2013 10:34:19 AM      MDM   Final diagnoses:  None    Patient given Lasix 60 mg IV push for his hypertension. Blood pressure remains elevated and started on a nitroglycerine drip for blood pressure control and tx of chf. Chest x-ray consistent with heart failure as well as with his elevated BNP. Will be admitted by the  internal medicine teaching service.  CRITICAL CARE Performed by: Leota Jacobsen Total critical care time: 50 Critical care time was exclusive of separately billable procedures and treating other patients. Critical care was necessary to treat or prevent imminent or life-threatening deterioration. Critical care was time spent personally by me on the following activities: development of treatment plan with patient and/or surrogate as well as nursing, discussions with consultants, evaluation of patient's response to treatment, examination of patient, obtaining history from patient or surrogate, ordering and performing treatments and interventions,  ordering and review of laboratory studies, ordering and review of radiographic studies, pulse oximetry and re-evaluation of patient's condition.     Leota Jacobsen, MD 11/28/13 1546  Leota Jacobsen, MD 11/28/13 [phone]

## 2013-11-28 NOTE — ED Notes (Signed)
Manual BP's taken and EDP updated. Cardene gtt held at this time per EDP.

## 2013-11-28 NOTE — ED Notes (Signed)
Food tray ordered for patient.

## 2013-11-28 NOTE — ED Notes (Signed)
MD at bedside. 

## 2013-11-28 NOTE — H&P (Signed)
Date: 11/28/2013               Patient Name:  Jerry Cantrell MRN: [id number]  DOB: 12-17-1965 Age / Sex: 48 y.o., male   PCP: Jerry Bellows, MD         Medical Service: Internal Medicine Teaching Service         Attending Physician: Dr. Axel Filler, MD    First Contact: Dr. Mechele Cantrell Pager: [phone]  Second Contact: Dr. Eulas Cantrell Pager: [phone]       After Hours (After 5p/  First Contact Pager: [phone]  weekends / holidays): Second Contact Pager: [phone]   Chief Complaint: recent fall with back and leg pain    History of Present Illness:  Jerry Cantrell is a 48 year old male with past medical history of hypertension, hyperlipidemia, insulin-dependent Type II DM, chronic low back pain, and tobacco abuse who presents with recent fall and resulting low back and leg pain.   Pt reports that 1 day ago his right leg gave out with resulting fall (x2) causing twisting of nerve in his back and as a result worsening low back pain and shooting pain with numbness and tingling down both legs (which he has had before). He reports he difficulty ambulating and tried placing ice packs on his back with no relief. He also took ibuprofen which only mildly improved the pain.  He reports he had recent falls in the past couple of months. He has baseline neuropathy in his feet due to diabetes. He denies recent weight loss, weakness, loss of bladder/bowel function, or worsening neuropathy.  He has chronic dyspnea on exertion with no worsening shortness of breath. He also has chronic LE swelling for the past 8 months which is not worse than normal. He denies orthopnea or PND. He is compliant with lasix therapy at home with no recent weight gain or dietary indiscretion.   He reports compliance with his home anti-hypertensive (did not take lasix this morning) and has mild headache with no chest pain, dyspnea, palpitations, vision changes (blurry vision at baseline), or decreased urine output.    Meds: Current  Facility-Administered Medications  Medication Dose Route Frequency Provider Last Rate Last Dose  . niCARdipine (CARDENE-IV) infusion (0.1 mg/ml)  3-15 mg/hr Intravenous Once Jerry Jacobsen, MD       Current Outpatient Prescriptions  Medication Sig Dispense Refill  . albuterol (PROVENTIL HFA;VENTOLIN HFA) 108 (90 BASE) MCG/ACT inhaler Inhale 2 puffs into the lungs every 4 (four) hours as needed for wheezing or shortness of breath.  1 Inhaler  6  . aspirin 81 MG chewable tablet Chew 1 tablet (81 mg total) by mouth daily.  30 tablet  11  . carvedilol (COREG) 25 MG tablet Take 25 mg by mouth 2 (two) times daily with a meal.      . furosemide (LASIX) 20 MG tablet Take 2 daily until fluid gone then 1 daily  60 tablet  6  . insulin aspart (NOVOLOG) 100 UNIT/ML injection Inject 4-8 Units into the skin 3 (three) times daily before meals.      . insulin glargine (LANTUS) 100 UNIT/ML injection Inject 25 Units into the skin 2 (two) times daily.      . nebivolol (BYSTOLIC) 10 MG tablet Take 20 mg by mouth daily.      Marland Kitchen olmesartan (BENICAR) 40 MG tablet Take 40 mg by mouth daily.        Allergies: Allergies as of 11/28/2013  . (No Known Allergies)  Past Medical History  Diagnosis Date  . Hypertension   . Hyperlipidemia   . Meralgia paraesthetica 05/03/2012  . Lumbar spondylosis 05/03/2012  . Lumbar spinal stenosis 05/03/2012    Mild with only right L4 nerve root encroachment, no neurogenic claudication   . Urinary incontinence 05/12/2012    Since the start of Sept. 2013   . Hemorrhoids 05/12/2012  . UTI (lower urinary tract infection)   . Meralgia paraesthetica 05/03/2012    On Lyrica which does improve pain.   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 Substance abuse   . Hyperlipidemia   . Pneumonia   . Diabetes mellitus   . Diabetes mellitus with nephropathy 05/12/2012   Past Surgical History  Procedure Laterality Date  . Tonsillectomy    . Abdominal surgery      Abscess I&D 2/2 infected hair  .  Colonoscopy w/ polypectomy      pt to bring records   Family History  Problem Relation Age of Onset  . Prostate cancer Father   . Alcohol abuse Father   . Emphysema Father     smoked   History   Social History  . Marital Status: Single    Spouse Name: N/A    Number of Children: N/A  . Years of Education: N/A   Occupational History  . dietary services [employer]   Social History Main Topics  . Smoking status: Current Every Day Smoker -- 0.50 packs/day for 30 years    Types: Cigarettes  . Smokeless tobacco: Never Used     Comment: "Trying - hard to stop"  . Alcohol Use: 3.6 oz/week    6 Cans of beer per week     Comment: beer  . Drug Use: No  . Sexual Activity: Not on file   Other Topics Concern  . Not on file   Social History Narrative  . No narrative on file    Review of Systems: Review of Systems  Constitutional: Negative for fever, chills, weight loss, malaise/fatigue and diaphoresis.  HENT: Negative for congestion and sore throat.   Eyes: Positive for blurred vision (chronic).  Respiratory: Positive for cough and shortness of breath.   Cardiovascular: Positive for leg swelling. Negative for chest pain, palpitations, orthopnea and PND.  Gastrointestinal: Negative for heartburn, nausea, vomiting, abdominal pain, diarrhea, constipation and blood in stool.  Genitourinary: Negative for dysuria, urgency, frequency and hematuria.  Musculoskeletal: Positive for back pain and falls.       Bilateral leg pain  Skin: Negative for rash.  Neurological: Positive for sensory change (chronic neuropathy) and headaches (mild). Negative for dizziness and weakness.    Physical Exam: Blood pressure 190/110, pulse 59, temperature 98.7 F (37.1 C), temperature source Oral, resp. rate 17, SpO2 95.00%. Physical Exam  Constitutional: He is oriented to person, place, and time. He appears well-developed and well-nourished. No distress.  HENT:  Head: Normocephalic and atraumatic.   Right Ear: External ear normal.  Left Ear: External ear normal.  Nose: Nose normal.  Mouth/Throat: Oropharynx is clear and moist. No oropharyngeal exudate.  Eyes: Conjunctivae and EOM are normal. Right eye exhibits no discharge. Left eye exhibits no discharge. No scleral icterus.  Neck: Normal range of motion. Neck supple. No JVD present.  Cardiovascular: Normal rate, regular rhythm and normal heart sounds.   No murmur heard. Pulmonary/Chest: Effort normal. No respiratory distress. He has no wheezes.  Coarse breath sounds bilaterally  Abdominal: Soft. Bowel sounds are normal. He exhibits no distension. There is  no tenderness. There is no rebound and no guarding.  Musculoskeletal: He exhibits edema (+1/2 b/l pitting edema) and tenderness.  Straight leg test negative b/l   Neurological: He is alert and oriented to person, place, and time. No cranial nerve deficit.  Normal 5/5 strength Decreased sensation to light touch of b/l hands and feet  Skin: Skin is warm and dry. He is not diaphoretic.  Psychiatric: He has a normal mood and affect. His behavior is normal. Judgment and thought content normal.     Lab results: Basic Metabolic Panel:  Recent Labs  11/28/13 1050  NA 140  K 4.1  CL 100  CO2 30  GLUCOSE 261*  BUN 20  CREATININE 1.25  CALCIUM 8.7   CBC:  Recent Labs  11/28/13 1050  WBC 8.1  HGB 18.5*  HCT 53.3*  MCV 90.6  PLT 122*   Cardiac Enzymes:  Recent Labs  11/28/13 1959  TROPONINI <0.30   BNP:  Recent Labs  11/28/13 1050  PROBNP 4116.0*   Urine Drug Screen: Drugs of Abuse     Component Value Date/Time   LABOPIA NONE DETECTED 10/25/2013 1947   LABOPIA NEG 06/02/2012 0945   COCAINSCRNUR POSITIVE* 10/25/2013 1947   COCAINSCRNUR NEG 06/02/2012 0945   LABBENZ NONE DETECTED 10/25/2013 1947   LABBENZ NEG 06/02/2012 0945   LABBENZ NEG 02/18/2010 2054   AMPHETMU NONE DETECTED 10/25/2013 1947   AMPHETMU NEG 02/18/2010 2054   THCU NONE DETECTED 10/25/2013  1947   LABBARB NONE DETECTED 10/25/2013 1947   LABBARB NEG 06/02/2012 0945    Urinalysis:  Recent Labs  11/28/13 1844  COLORURINE YELLOW  LABSPEC 1.010  PHURINE 6.0  GLUCOSEU NEGATIVE  HGBUR MODERATE*  BILIRUBINUR NEGATIVE  KETONESUR NEGATIVE  PROTEINUR 100*  UROBILINOGEN 0.2  NITRITE NEGATIVE  LEUKOCYTESUR NEGATIVE     Imaging results:  Dg Chest 2 View  11/28/2013   CLINICAL DATA:  Shortness of breath, left chest pain  EXAM: CHEST  2 VIEW  COMPARISON:  10/25/2012  FINDINGS: Cardiomegaly noted with chronic vascular congestion and interstitial prominence. Small pleural effusions noted bilaterally extending along the fissures on lateral view. Streaky mild basilar densities noted, suspect atelectasis or scarring. Trachea is midline. Degenerative changes of the spine.  IMPRESSION: Cardiomegaly with vascular and interstitial prominence with small effusions, compatible with mild CHF.  Basilar atelectasis  No pneumothorax   Electronically Signed   By: Daryll Brod M.D.   On: 11/28/2013 14:54    Other results: EKG:  Ventricular Rate: 76  PR Interval: 176  QRS Duration: 108  QT Interval: 430  QTC Calculation: 483  R Axis: 117  Text Interpretation: Normal sinus rhythm Slow R Progression, Noted 01-28-2013 T wave abnormality, consider inferior ischemia     Assessment & Plan by Problem:  Hypertensive Urgency  - Pt presented with blood pressure of 221/130 with no signs of end-organ damage. Pt reported compliance (?) with home anti-hypertensive's (carvedilol 25 mg BID, olmesartan 40 mg daily,  nebivolol 20 mg daily, and furosemide 20 mg (2  daily until fluid gone then 1 daily at home). Pt was placed on IV nitroglycerin in ED with improvement of blood pressure. Pt also received IV 60 mg lasix (x1 dose) in ED for fluid overload.  -Monitor on telemetry -Repeat EKG in AM -Cycle troponins -Continue IV nitroglycerin with goal 20% lowering in 24 hrs (BP 180/100) -IV lasix 60 mg daily and  adjustment as necessary -Continue home carvedilol 25 mg BID -Continue home olmesartan 40 mg  daily -Hold home nebivolol 20 mg daily   Volume Overload - Pt with chronic DOE and LE edema with pro-BNP of 4116 and chest xray with cardiomegaly with vascular and interstitial prominence with small effusions concerning for CHF. Last 2D-echo on 01/30/13 with moderate LVH, normal EF 55-60%, and normal diastolic function. Initial troponin was negative. Pt received IV 60 mg lasix in ED. -Obtain 2D-echo -Monitor daily weight and strict I & O's -IV lasix 60 mg daily and adjustment as necessary -Albuterol inhaler Q4hr PRN acute bronchospasm -Consider cardiology consult in AM  Mechanical fall in setting of lumbar spondylosis and stenosis -  Pt reported 2 recent falls due to right leg letting out (most likely due to diabetic neuropathy) with resulting worsening of low back pain and shooting pain in legs. Straight leg test negative with no alarm symptoms. Pt with history of lumbar spondylosis and spinal stenosis.  Last MRI 01/26/12 with L4-5 bilateral facet joint degenerative changes, with bulge greatest at right lateral position with encroachment upon the exiting right L4 nerve root. Also with mild bilateral L5-S1 foraminal narrowing slightly greater on the right. -Obtain MRI lumbar spine w/o cont -Obtain PT consult   Insulin-dependent Type II Diabetes Mellitus - Last A1c of 7.6 on 08/09/13.  Pt at home on Lantus 25 U BID and Novolog 4-8 U TID before meals. Mild proteinuria (100) on UA. -Obtain A1c  -Lantus 15 U BID,  increase to 25 U BID if uncontrolled -Insulin sliding scale with meals and at bedtime -Carb modified diet   Thrombocytopenia - Pt with platelet count of 122K on admission with past history of low levels. Currently with no active bleeding. -Monitor CBC in setting of heparin use -Monitor for bleeding  Hyperlipidemia - Last lipid panel on 05/11/13 with hypercholesteremia (219), hypertriglyceridemia  (400), low HDL (39), and high LDL (100).  -Consider initiating statin therapy (LFT normal on 10/25/13)  Substance Abuse - UDS negative on admission. Pt with cocaine positive UDS on 10/25/13. Pt currently smokes cigarettes but trying to cut down (0.5 pack/day for 30 yrs).  -Encourage tobacco cessation  Diet: Carb modified  DVT Ppx: SQ Heparin TID Code: Full   Dispo: Disposition is deferred at this time, awaiting improvement of current medical problems.  The patient does have a current PCP Jerry Bellows, MD) and does need an [facility] hospital follow-up appointment after discharge.  The patient does not have transportation limitations that hinder transportation to clinic appointments.  Signed: Juluis Mire, MD 11/28/2013, 6:38 PM

## 2013-11-28 NOTE — ED Notes (Signed)
Report given to Abbott Pao.

## 2013-11-28 NOTE — ED Notes (Signed)
Pt reports sob for extended amount of time. Having swelling to legs, more severe in right leg. bp 221/130 at triage. spo2 94%, ekg done. Airway is intact. Reports hx of htn and has taken his meds today. Had been diagnosed with pneumonia a year ago and having sob and productive cough since then.

## 2013-11-29 ENCOUNTER — Ambulatory Visit: Payer: Self-pay | Admitting: Internal Medicine

## 2013-11-29 ENCOUNTER — Inpatient Hospital Stay (HOSPITAL_COMMUNITY): Payer: BC Managed Care – PPO

## 2013-11-29 DIAGNOSIS — I509 Heart failure, unspecified: Secondary | ICD-10-CM

## 2013-11-29 DIAGNOSIS — G8929 Other chronic pain: Secondary | ICD-10-CM

## 2013-11-29 DIAGNOSIS — M545 Low back pain, unspecified: Secondary | ICD-10-CM

## 2013-11-29 DIAGNOSIS — Z794 Long term (current) use of insulin: Secondary | ICD-10-CM

## 2013-11-29 DIAGNOSIS — I10 Essential (primary) hypertension: Secondary | ICD-10-CM

## 2013-11-29 DIAGNOSIS — I517 Cardiomegaly: Secondary | ICD-10-CM

## 2013-11-29 DIAGNOSIS — E119 Type 2 diabetes mellitus without complications: Secondary | ICD-10-CM

## 2013-11-29 LAB — BASIC METABOLIC PANEL WITH GFR
BUN: 30 mg/dL — ABNORMAL HIGH (ref 6–23)
CO2: 30 meq/L (ref 19–32)
Calcium: 8.3 mg/dL — ABNORMAL LOW (ref 8.4–10.5)
Chloride: 98 meq/L (ref 96–112)
Creatinine, Ser: 1.68 mg/dL — ABNORMAL HIGH (ref 0.50–1.35)
GFR calc Af Amer: 54 mL/min — ABNORMAL LOW
GFR calc non Af Amer: 47 mL/min — ABNORMAL LOW
Glucose, Bld: 331 mg/dL — ABNORMAL HIGH (ref 70–99)
Potassium: 3.8 meq/L (ref 3.7–5.3)
Sodium: 138 meq/L (ref 137–147)

## 2013-11-29 LAB — BASIC METABOLIC PANEL
BUN: 28 mg/dL — AB (ref 6–23)
BUN: 30 mg/dL — AB (ref 6–23)
CALCIUM: 8.1 mg/dL — AB (ref 8.4–10.5)
CHLORIDE: 99 meq/L (ref 96–112)
CO2: 29 meq/L (ref 19–32)
CO2: 30 meq/L (ref 19–32)
CREATININE: 1.85 mg/dL — AB (ref 0.50–1.35)
Calcium: 8.3 mg/dL — ABNORMAL LOW (ref 8.4–10.5)
Chloride: 97 mEq/L (ref 96–112)
Creatinine, Ser: 1.69 mg/dL — ABNORMAL HIGH (ref 0.50–1.35)
GFR calc Af Amer: 48 mL/min — ABNORMAL LOW (ref >90)
GFR calc Af Amer: 54 mL/min — ABNORMAL LOW (ref >90)
GFR calc non Af Amer: 41 mL/min — ABNORMAL LOW (ref >90)
GFR, EST NON AFRICAN AMERICAN: 46 mL/min — AB (ref >90)
GLUCOSE: 353 mg/dL — AB (ref 70–99)
Glucose, Bld: 382 mg/dL — ABNORMAL HIGH (ref 70–99)
Potassium: 4.3 mEq/L (ref 3.7–5.3)
Potassium: 4.3 mEq/L (ref 3.7–5.3)
SODIUM: 138 meq/L (ref 137–147)
Sodium: 141 mEq/L (ref 137–147)

## 2013-11-29 LAB — TROPONIN I
Troponin I: <0.3 ng/mL (ref <0.30)
Troponin I: <0.3 ng/mL
Troponin I: <0.3 ng/mL (ref <0.30)

## 2013-11-29 LAB — HEMOGLOBIN A1C
Hgb A1c MFr Bld: 8.9 % — ABNORMAL HIGH (ref <5.7)
Mean Plasma Glucose: 209 mg/dL — ABNORMAL HIGH (ref <117)

## 2013-11-29 LAB — GLUCOSE, CAPILLARY
GLUCOSE-CAPILLARY: 319 mg/dL — AB (ref 70–99)
GLUCOSE-CAPILLARY: 140 mg/dL — AB (ref 70–99)
Glucose-Capillary: 309 mg/dL — ABNORMAL HIGH (ref 70–99)
Glucose-Capillary: 401 mg/dL — ABNORMAL HIGH (ref 70–99)
Glucose-Capillary: 163 mg/dL — ABNORMAL HIGH (ref 70–99)

## 2013-11-29 LAB — CBC
HCT: 50.1 % (ref 39.0–52.0)
Hemoglobin: 16.8 g/dL (ref 13.0–17.0)
MCH: 30.3 pg (ref 26.0–34.0)
MCHC: 33.5 g/dL (ref 30.0–36.0)
MCV: 90.3 fL (ref 78.0–100.0)
Platelets: 112 K/uL — ABNORMAL LOW (ref 150–400)
RBC: 5.55 MIL/uL (ref 4.22–5.81)
RDW: 13.9 % (ref 11.5–15.5)
WBC: 8.4 K/uL (ref 4.0–10.5)

## 2013-11-29 LAB — MAGNESIUM: MAGNESIUM: 1.7 mg/dL (ref 1.5–2.5)

## 2013-11-29 LAB — MRSA PCR SCREENING: MRSA by PCR: NEGATIVE

## 2013-11-29 MED ORDER — MORPHINE SULFATE 2 MG/ML IJ SOLN
2.0000 mg | Freq: Once | INTRAMUSCULAR | Status: AC
  Administered 2013-11-29: 2 mg via INTRAVENOUS
  Filled 2013-11-29: qty 1

## 2013-11-29 MED ORDER — HYDRALAZINE HCL 20 MG/ML IJ SOLN
5.0000 mg | Freq: Four times a day (QID) | INTRAMUSCULAR | Status: DC | PRN
  Administered 2013-11-29: 10 mg via INTRAVENOUS

## 2013-11-29 MED ORDER — LABETALOL HCL 5 MG/ML IV SOLN
0.5000 mg/min | INTRAVENOUS | Status: DC
  Administered 2013-11-29: 0.5 mg/min via INTRAVENOUS
  Filled 2013-11-29: qty 100

## 2013-11-29 MED ORDER — INSULIN GLARGINE 100 UNIT/ML ~~LOC~~ SOLN
20.0000 [IU] | Freq: Two times a day (BID) | SUBCUTANEOUS | Status: DC
  Administered 2013-11-29 – 2013-11-30 (×3): 20 [IU] via SUBCUTANEOUS
  Filled 2013-11-29 (×6): qty 0.2

## 2013-11-29 MED ORDER — ONDANSETRON HCL 4 MG PO TABS: 4.0000 mg | ORAL_TABLET | Freq: Once | ORAL | Status: DC

## 2013-11-29 MED ORDER — SODIUM CHLORIDE 0.9 % IV SOLN: INTRAVENOUS | Status: DC

## 2013-11-29 MED ORDER — HYDRALAZINE HCL 20 MG/ML IJ SOLN
5.0000 mg | Freq: Four times a day (QID) | INTRAMUSCULAR | Status: DC | PRN
  Administered 2013-11-29 – 2013-11-30 (×2): 10 mg via INTRAVENOUS
  Administered 2013-12-02: 5 mg via INTRAVENOUS
  Filled 2013-11-29 (×2): qty 1

## 2013-11-29 MED ORDER — ONDANSETRON HCL 4 MG/2ML IJ SOLN
4.0000 mg | Freq: Once | INTRAMUSCULAR | Status: AC
  Administered 2013-11-29: 4 mg via INTRAVENOUS
  Filled 2013-11-29: qty 2

## 2013-11-29 MED ORDER — ONDANSETRON HCL 4 MG/2ML IJ SOLN: 4.0000 mg | Freq: Four times a day (QID) | INTRAMUSCULAR | Status: DC | PRN

## 2013-11-29 MED ORDER — MORPHINE SULFATE 2 MG/ML IJ SOLN: 2.0000 mg | INTRAMUSCULAR | Status: DC | PRN

## 2013-11-29 MED ORDER — CARVEDILOL 25 MG PO TABS
25.0000 mg | ORAL_TABLET | Freq: Two times a day (BID) | ORAL | Status: DC
  Administered 2013-11-29 – 2013-12-04 (×11): 25 mg via ORAL
  Filled 2013-11-29 (×13): qty 1

## 2013-11-29 MED ORDER — HYDRALAZINE HCL 20 MG/ML IJ SOLN
5.0000 mg | Freq: Once | INTRAMUSCULAR | Status: AC
  Administered 2013-11-29: 5 mg via INTRAVENOUS
  Filled 2013-11-29: qty 1

## 2013-11-29 NOTE — Progress Notes (Signed)
Subjective: Patient states his SOB has improved since admission, but that he remains somewhat SOB. He tells me that over the last 8 months he has become increasingly SOB w/ exertion. No chest pain. Today he has a HA. He remains on 2LPM [place]. VSS.   Objective: Vital signs in last 24 hours: Filed Vitals:   11/29/13 0630 11/29/13 0700 11/29/13 1200 11/29/13 1235  BP: 159/76 131/80 152/81   Pulse: 59 63 57   Temp:  97.8 F (36.6 C)  98.3 F (36.8 C)  TempSrc:  Oral  Axillary  Resp: 14 17 13    Height:      Weight:      SpO2: 96% 93% 94%    Weight change:   Intake/Output Summary (Last 24 hours) at 11/29/13 1314 Last data filed at 11/29/13 1300  Gross per 24 hour  Intake 411.96 ml  Output   2325 ml  Net -1913.04 ml   Physical Exam General: alert, cooperative, and in no apparent distress HEENT: vision grossly intact, oropharynx clear and non-erythematous  Neck: supple Lungs: coarse breath sounds to bilateral bases; normal work of respiration, no wheezes or rales Heart: regular rate and rhythm, no murmurs, gallops, or rubs Abdomen: soft, non-tender, non-distended, normal bowel sounds Extremities: trace pitting edema b/l Neurologic: alert & oriented X3, cranial nerves II-XII grossly intact, strength grossly intact, sensation intact to light touch  Lab Results: Basic Metabolic Panel:  Recent Labs Lab 11/29/13 0017 11/29/13 0354  NA 141 138  K 4.3 3.8  CL 99 98  CO2 30 30  GLUCOSE 382* 331*  BUN 30* 30*  CREATININE 1.85* 1.68*  CALCIUM 8.1* 8.3*  MG 1.7  --    CBC:  Recent Labs Lab 11/28/13 1050 11/29/13 0354  WBC 8.1 8.4  HGB 18.5* 16.8  HCT 53.3* 50.1  MCV 90.6 90.3  PLT 122* 112*   Cardiac Enzymes:  Recent Labs Lab 11/28/13 1959 11/29/13 0017 11/29/13 0354  TROPONINI <0.30 <0.30 <0.30   BNP:  Recent Labs Lab 11/28/13 1050  PROBNP 4116.0*   CBG:  Recent Labs Lab 11/28/13 2144 11/29/13 0735 11/29/13 1238  GLUCAP 319* 309* 401*   Urine  Drug Screen: Drugs of Abuse     Component Value Date/Time   LABOPIA NONE DETECTED 11/28/2013 1843   LABOPIA NEG 06/02/2012 0945   COCAINSCRNUR NONE DETECTED 11/28/2013 1843   COCAINSCRNUR NEG 06/02/2012 0945   LABBENZ NONE DETECTED 11/28/2013 1843   LABBENZ NEG 06/02/2012 0945   LABBENZ NEG 02/18/2010 2054   AMPHETMU NONE DETECTED 11/28/2013 1843   AMPHETMU NEG 02/18/2010 2054   THCU NONE DETECTED 11/28/2013 1843   LABBARB NONE DETECTED 11/28/2013 1843   LABBARB NEG 06/02/2012 0945   Urinalysis:  Recent Labs Lab 11/28/13 1844  COLORURINE YELLOW  LABSPEC 1.010  PHURINE 6.0  GLUCOSEU NEGATIVE  HGBUR MODERATE*  BILIRUBINUR NEGATIVE  KETONESUR NEGATIVE  PROTEINUR 100*  UROBILINOGEN 0.2  NITRITE NEGATIVE  LEUKOCYTESUR NEGATIVE    Micro Results: Recent Results (from the past 240 hour(s))  MRSA PCR SCREENING     Status: None   Collection Time    11/28/13 10:21 PM      Result Value Ref Range Status   MRSA by PCR NEGATIVE  NEGATIVE Final   Comment:            The GeneXpert MRSA Assay (FDA     approved for NASAL specimens     only), is one component of a     comprehensive MRSA  colonization     surveillance program. It is not     intended to diagnose MRSA     infection nor to guide or     monitor treatment for     MRSA infections.   Studies/Results: Dg Chest 2 View  11/28/2013   CLINICAL DATA:  Shortness of breath, left chest pain  EXAM: CHEST  2 VIEW  COMPARISON:  10/25/2012  FINDINGS: Cardiomegaly noted with chronic vascular congestion and interstitial prominence. Small pleural effusions noted bilaterally extending along the fissures on lateral view. Streaky mild basilar densities noted, suspect atelectasis or scarring. Trachea is midline. Degenerative changes of the spine.  IMPRESSION: Cardiomegaly with vascular and interstitial prominence with small effusions, compatible with mild CHF.  Basilar atelectasis  No pneumothorax   Electronically Signed   By: Daryll Brod M.D.   On:  11/28/2013 14:54   Medications: I have reviewed the patient's current medications. Scheduled Meds: . aspirin  81 mg Oral Daily  . carvedilol  25 mg Oral BID WC  . furosemide  60 mg Intravenous Daily  . insulin aspart  0-20 Units Subcutaneous TID WC  . insulin aspart  0-5 Units Subcutaneous QHS  . insulin glargine  15 Units Subcutaneous BID  . sodium chloride  3 mL Intravenous Q12H   Continuous Infusions: . sodium chloride 10 mL/hr at 11/29/13 0800  . labetalol (NORMODYNE) infusion 0.5 mg/min (11/29/13 0800)  . nitroGLYCERIN Stopped (11/29/13 0645)   PRN Meds:.albuterol, hydrALAZINE, morphine injection, ondansetron (ZOFRAN) IV, oxyCODONE-acetaminophen  Assessment/Plan:  Hypertensive Emergency with volume overload, likely CHF exacerbation - Pt presented with blood pressure of 221/130 with AKI (Cr 1.25-->1.85-->1.68), T wave inversion in inferior leads, and likely CHF exacerbation (pro-BNP of 4116 CXR with cardiomegaly with vascular and interstitial prominence with small effusions concerning for CHF). Last 2D-echo on 01/30/13 with moderate LVH, normal EF 55-60%, and normal diastolic function. Patient diuresed 1.1L overnight and his weight is down 1lb. Troponin neg x 3, ACS ruled out. Patient admits to poor compliance with his home medication regimen (carvedilol 25 mg BID, olmesartan 40 mg daily, nebivolol 20 mg daily, and furosemide 20 mg (2 daily until fluid gone then 1 daily at home). Pt was placed on IV nitroglycerin in ED with improvement of blood pressure, however overnight blood pressure increased again and so a labetalol gtt was started. Pt also received IV 60 mg lasix (x1 dose) in ED for fluid overload and another lasix 20mg  IV overnight. UDS negative. -Blood pressure now improved and we will transition to home dose of coreg, stop NTG gtt and wean off the labetalol gtt. If blood pressure rises overnight, can add amlodipine PO. -lasix 60mg  IV daily -tele -cardiology consult, appreciate  recs -f/u Echo results -daily weights, I/Os -restart home carvedilol 25 mg BID  -hold home olmesartan 40 mg daily and nebivolol 20 mg daily  -consider outpatient PFTs given smoking hx   Mechanical fall in setting of lumbar spondylosis and stenosis - Pt reported 2 recent falls due to right leg letting out (most likely due to diabetic neuropathy) with resulting worsening of low back pain and shooting pain in legs. Straight leg test negative with no alarm symptoms. Pt with history of lumbar spondylosis and spinal stenosis. Last MRI 01/26/12 with L4-5 bilateral facet joint degenerative changes, with bulge greatest at right lateral position with encroachment upon the exiting right L4 nerve root. Also with mild bilateral L5-S1 foraminal narrowing slightly greater on the right.  -MRI lumbar spine w/o cont  -PT  consult   Insulin-dependent Type II Diabetes Mellitus -  Pt at home on Lantus 25 U BID and Novolog 4-8 U TID before meals. A1c 8.9% as of 11/29/13.  -increase lantus to 20U BID given uncontrolled CBGs -Insulin sliding scale with meals and at bedtime  -Carb modified diet   Thrombocytopenia - Pt with platelet count of 122K on admission with past history of low levels. Currently with no active bleeding.  -Monitor CBC in setting of heparin use  -Monitor for bleeding   Hyperlipidemia - Last lipid panel on 05/11/13 with hypercholesteremia (219), hypertriglyceridemia (400), low HDL (39), and high LDL (100).  -Consider initiating statin therapy (LFT normal on 10/25/13)   Substance Abuse - UDS negative on admission. Pt with cocaine positive UDS on 10/25/13. Patient currently smokes 1/2ppd. -Encourage tobacco cessation   Diet: Carb modified   DVT Ppx: SQ Heparin TID   Code: Full   Dispo: Disposition is deferred at this time, awaiting improvement of current medical problems.  Anticipated discharge in approximately 2 day(s).   The patient does have a current PCP Otho Bellows, MD) and does need an  [facility] hospital follow-up appointment after discharge.  The patient does not have transportation limitations that hinder transportation to clinic appointments.  .Services Needed at time of discharge: Y = Yes, Blank = No PT:   OT:   RN:   Equipment:   Other:     LOS: 1 day   Rebecca Eaton, MD 11/29/2013, 1:14 PM

## 2013-11-29 NOTE — Progress Notes (Signed)
Inpatient Diabetes Program Recommendations  AACE/ADA: New Consensus Statement on Inpatient Glycemic Control (2013)  Target Ranges:  Prepandial:   less than 140 mg/dL      Peak postprandial:   less than 180 mg/dL (1-2 hours)      Critically ill patients:  140 - 180 mg/dL   Results for JAMOR, AJAYI (MRN [id number]) as of 11/29/2013 10:45  Ref. Range 11/29/2013 00:17 11/29/2013 03:54  Glucose Latest Range: 70-99 mg/dL 382 (H) 331 (H)  Results for ROCHELL, VAZZANA (MRN [id number]) as of 11/29/2013 10:45  Ref. Range 11/29/2013 07:35  Glucose-Capillary Latest Range: 70-99 mg/dL 309 (H)   Diabetes history: DM2 Outpatient Diabetes medications: Lantus 25 units BID, Novolog 4-8 units TID Current orders for Inpatient glycemic control: Lantus 15 units BID, Novolog 0-20 units AC, Novolog 0-5 units HS  Inpatient Diabetes Program Recommendations Insulin - Basal: Please consider increasing Lantus to 25 units BID. HgbA1C: A1C has been ordered and is in process.  Thanks, Barnie Alderman, RN, MSN, CCRN Diabetes Coordinator Inpatient Diabetes Program [phone] (Team Pager) [phone] ([facility] office) [phone] [facility] office)

## 2013-11-29 NOTE — Progress Notes (Signed)
S: Patient still has persistent elevated BP at 220/120's despite nitroglycerin drip titrating up. He also c/o 10/10 headache with no improvement from Percocet and morphine. NTG drip is at 60 mcg/min now.   O : BP 235/113, HR 68, RR 20, O2 sat 92% on 2L.       General: mild distress due to headache       Neuro: Alert, oriented, thought content appropriate.  Speech fluent without evidence of aphasia.  Able to follow 3 step commands without difficulty. Cranial Nerves: II to XII intact.  Motor: all extremities strength 5/5. Sensation intact  A/P - Hypertensive emergency   Patient has persistent elevated BP despite NTG titration. The severe headache is certainly contributing to his elevated BP. No focal neuro defict noted on exam. The etiology of his severe headache is likely due to NTG drip.   - will add hydralazine 5-10 mg Q6 prn with a goal of BP at 180/110-120 - will add morphine 2 mg Q3 prn for pain - will not be able to titrate up NTG drip given severe headache which actually exaggerate his hypertension - will call cardiology for consult.  - will hold his heparin SQ and may consider head CT if focal neuro deficit is noted.    Charlann Lange, MD PGY-3  [facility]  [phone] pager 4:17 AM

## 2013-11-29 NOTE — Care Management Note (Addendum)
  Page 2 of 2   12/12/2013     2:28:22 PM   CARE MANAGEMENT NOTE 12/12/2013  Patient:  Jerry Cantrell, Jerry Cantrell   Account Number:  [id number]  Date Initiated:  11/29/2013  Documentation initiated by:  Elissa Hefty  Subjective/Objective Assessment:   adm w htn urgency, sys bp >200     Action/Plan:   lives w fam, pcp dr Curt Bears glenn   Anticipated DC Date:     Anticipated DC Plan:  [place]  CM consult      [facility] Choice  NA   Choice offered to / List presented to:  C-1 Patient   DME arranged  [place]      DME agency  [place] [facility].        Status of service:  Completed, signed off Medicare Important Message given?   (If response is "NO", the following Medicare IM given date fields will be blank) Date Medicare IM given:   Date Additional Medicare IM given:    Discharge Disposition:  HOME/SELF CARE  Per UR Regulation:  Reviewed for med. necessity/level of care/duration of stay  If discussed at [place] of Stay Meetings, dates discussed:    Comments:  12/12/2013 Post Discharge UPDATE Note. Update received from [place]; Patient states he is unable to walk - can not get out of house for OP PT services. Copy of OP PT fax Update faxed to PCP, Dr. Otho Bellows, MD  Phone: [phone]; Fax: [phone] Note:  Please contact CM for any  questions Kongmeng Santoro RN, BSN, [facility], [facility] 12/12/2012  [phone]  12/01/2013 Hx/o admitted with shortness of breath/difficulty breathing.  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  A1c 8.9 Social:  From home with Girlfriend.  Patient is  still working. PT RECS:  OP PT (order faxed to [place] location  [phone] phone. DME Recs: Rolling walker (ordered from [facility]) Home Oxygen d/t low sat readings (ordered from [facility]) Disposition Plan:  Home / Self care and [name] RN, BSN, MSHL, CCM 12/01/2013   11/30/2013 MD:  BENEFITS Check Update Note: Lantus and 70/30 - both meds are a tier 3 and a  $50 co-pay Arturo Freundlich RN, BSN, MSHL, CCM 11/30/2012  11/30/13 1459 debbie dowell rn,bsn spoke w pt. he has ins  but copays have been high and he has not been able to get meds. spoke w md and she will try and get most bp meds on 4.00 list at [place]. generic for norvasc is 10.00 per month. pt req i get prescription cards for ins and activate which i was able to do for 25.00 per month for lantus and 25.00 per month for novolog aspart. his copay 50.00 without cards. he understands he must show savings cards each month to get for 25.00 eac copay.  4/1 0805 debbie dowell rn,bsn will moniter for dc planning needs as pt progresses.

## 2013-11-29 NOTE — H&P (Signed)
Internal Medicine Attending Admission Note Date: 11/29/2013  Patient name: Jerry Cantrell Medical record number: [id number] Date of birth: 01/19/1966 Age: 48 y.o. Gender: male  I saw and evaluated the patient. I reviewed the resident's note and I agree with the resident's findings and plan as documented in the resident's note, with the following additional comments.  Chief Complaint(s): Shortness of breath aggravated by minimal exertion; lower extremity edema  History - key components related to admission: Patient is a 48 year old man with history of hypertension, hyperlipidemia, type 2 diabetes mellitus, chronic low back pain with intermittent sciatic pain, and other problems as outlined in the medical history who presented with low back pain and right leg pain which caused him to fall yesterday, but was found to be severely hypertensive with clinical evidence of heart failure and therefore was admitted for inpatient care.  Patient reports that his low back pain and sciatic pain are chronic and intermittent; he denies any persistent neurologic deficit.  He does report progressive chronic exertional dyspnea aggravated by even minimal exertion, and bilateral lower extremity edema.  He reports that he has difficulty affording his medications and therefore has not been taking them consistently.  Physical Exam - key components related to admission:  Filed Vitals:   11/29/13 0600 11/29/13 0615 11/29/13 0630 11/29/13 0700  BP: 157/69 162/78 159/76 131/80  Pulse: 63 65 59 63  Temp:    97.8 F (36.6 C)  TempSrc:    Oral  Resp: 4 11 14 17   Height:      Weight:      SpO2: 91% 87% 96% 93%    Lab results:   Basic Metabolic Panel:  Recent Labs  11/29/13 0017 11/29/13 0354  NA 141 138  K 4.3 3.8  CL 99 98  CO2 30 30  GLUCOSE 382* 331*  BUN 30* 30*  CREATININE 1.85* 1.68*  CALCIUM 8.1* 8.3*  MG 1.7  --     CBC:  Recent Labs  11/28/13 1050 11/29/13 0354  WBC 8.1 8.4  HGB 18.5* 16.8   HCT 53.3* 50.1  MCV 90.6 90.3  PLT 122* 112*     Cardiac Enzymes:  Recent Labs  11/28/13 1959 11/29/13 0017 11/29/13 0354  TROPONINI <0.30 <0.30 <0.30    BNP:  Recent Labs  11/28/13 1050  PROBNP 4116.0*     CBG:  Recent Labs  11/29/13 0735  GLUCAP 309*      Urine Drug Screen: Drugs of Abuse     Component Value Date/Time   LABOPIA NONE DETECTED 11/28/2013 1843   LABOPIA NEG 06/02/2012 0945   COCAINSCRNUR NONE DETECTED 11/28/2013 1843   COCAINSCRNUR NEG 06/02/2012 0945   LABBENZ NONE DETECTED 11/28/2013 1843   LABBENZ NEG 06/02/2012 0945   LABBENZ NEG 02/18/2010 2054   AMPHETMU NONE DETECTED 11/28/2013 1843   AMPHETMU NEG 02/18/2010 2054   THCU NONE DETECTED 11/28/2013 1843   LABBARB NONE DETECTED 11/28/2013 1843   LABBARB NEG 06/02/2012 0945      Urinalysis    Component Value Date/Time   COLORURINE YELLOW 11/28/2013 1844   APPEARANCEUR CLEAR 11/28/2013 1844   LABSPEC 1.010 11/28/2013 1844   PHURINE 6.0 11/28/2013 1844   GLUCOSEU NEGATIVE 11/28/2013 1844   HGBUR MODERATE* 11/28/2013 1844   BILIRUBINUR NEGATIVE 11/28/2013 1844   KETONESUR NEGATIVE 11/28/2013 1844   PROTEINUR 100* 11/28/2013 1844   UROBILINOGEN 0.2 11/28/2013 1844   NITRITE NEGATIVE 11/28/2013 1844   LEUKOCYTESUR NEGATIVE 11/28/2013 1844    Urine microscopic:  Recent Labs  11/28/13 1844  EPIU FEW*  WBCU 0-2  RBCU 7-10  BACTERIA RARE  LABCAST HYALINE CASTS*    Imaging results:  Dg Chest 2 View  11/28/2013   CLINICAL DATA:  Shortness of breath, left chest pain  EXAM: CHEST  2 VIEW  COMPARISON:  10/25/2012  FINDINGS: Cardiomegaly noted with chronic vascular congestion and interstitial prominence. Small pleural effusions noted bilaterally extending along the fissures on lateral view. Streaky mild basilar densities noted, suspect atelectasis or scarring. Trachea is midline. Degenerative changes of the spine.  IMPRESSION: Cardiomegaly with vascular and interstitial prominence with small effusions,  compatible with mild CHF.  Basilar atelectasis  No pneumothorax   Electronically Signed   By: Daryll Brod M.D.   On: 11/28/2013 14:54    Other results: EKG: Normal sinus rhythm; poor R progression; inferior T wave inversion  Assessment & Plan by Problem:  1.  Hypertensive urgency.  Patient presented with severe hypertension and associated congestive heart failure, likely due to nonadherence to his medications.  He reports difficulty affording his deductible. He is symptomatically better following initial diuresis and better control of his blood pressure.  Cardiology consult was obtained.  Plan today is transition to oral antihypertensive regimen; continue diuresis; follow in/outs and daily weights; follow electrolytes and renal function; 2-D echocardiogram; monitor.  2.  Fall due to low back pain and right lower extremity pain with subjective weakness.  Patient reports resolution of the subjective weakness, and says this has occurred in the past.  Plan is MRI of lumbosacral spine; follow exam.  3.  Diabetes mellitus.  Plan is Lantus insulin with meal time coverage; follow blood sugars and adjust regimen as indicated; diabetes education.  4.  Other problems and plans as per the resident physician's note.

## 2013-11-29 NOTE — Consult Note (Signed)
Cardiology Consult Note Otho Bellows, MD No ref. provider found  Reason for consult: uncontrolled HTN  History of Present Illness (and review of medical records): Jerry Cantrell is a 48 y.o. male who presents for evaluation of shortness of breath.  He has hx of HTN, HLD, DM and chronic pain due to lumbar stenosis.  He states that he has had shortness of breath for some time now along with lower extremity swelling.  He denies any chest pain.  He has had problems affording his BP medications.  He reports needing to be on 5-6 meds but only able to take 1-2 daily.  Evaluation in the ED was concerning for elevated blood pressure SBP >200s.  BNP and CXR were also concerning for CHF.  He was given NTG and Lasix in ED.  He was admitted to medicine service for further evaluation and management.  Cardiology was called due to difficult to control BP overnight in ICU.  He remains chest pain free and breathing has much improved.  He only complains of headache at this time after being on NTG gtt.  Review of Systems No fevers, chills, night sweats.  Further review of systems was otherwise negative other than stated in HPI.   Past Medical History  Diagnosis Date  . Hypertension   . Hyperlipidemia   . Meralgia paraesthetica 05/03/2012  . Lumbar spondylosis 05/03/2012  . Lumbar spinal stenosis 05/03/2012    Mild with only right L4 nerve root encroachment, no neurogenic claudication   . Urinary incontinence 05/12/2012    Since the start of Sept. 2013   . Hemorrhoids 05/12/2012  . UTI (lower urinary tract infection)   . Meralgia paraesthetica 05/03/2012    On Lyrica which does improve pain.   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 Substance abuse   . Hyperlipidemia   . Pneumonia   . Diabetes mellitus   . 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 Tonsillectomy    . Abdominal surgery      Abscess I&D 2/2 infected hair  . Colonoscopy w/ polypectomy       pt to bring records    Prescriptions prior to admission  Medication Sig Dispense Refill  . albuterol (PROVENTIL HFA;VENTOLIN HFA) 108 (90 BASE) MCG/ACT inhaler Inhale 2 puffs into the lungs every 4 (four) hours as needed for wheezing or shortness of breath.  1 Inhaler  6  . aspirin 81 MG chewable tablet Chew 1 tablet (81 mg total) by mouth daily.  30 tablet  11  . carvedilol (COREG) 25 MG tablet Take 25 mg by mouth 2 (two) times daily with a meal.      . furosemide (LASIX) 20 MG tablet Take 2 daily until fluid gone then 1 daily  60 tablet  6  . insulin aspart (NOVOLOG) 100 UNIT/ML injection Inject 4-8 Units into the skin 3 (three) times daily before meals.      . insulin glargine (LANTUS) 100 UNIT/ML injection Inject 25 Units into the skin 2 (two) times daily.      . nebivolol (BYSTOLIC) 10 MG tablet Take 20 mg by mouth daily.      Marland Kitchen olmesartan (BENICAR) 40 MG tablet Take 40 mg by mouth daily.       No Known Allergies  History  Substance Use Topics  . Smoking status: Current Every Day Smoker -- 0.50 packs/day for 30 years    Types: Cigarettes  . Smokeless tobacco: Never  Used     Comment: "Trying - hard to stop"  . Alcohol Use: 3.6 oz/week    6 Cans of beer per week     Comment: beer    Family History  Problem Relation Age of Onset  . Prostate cancer Father   . Alcohol abuse Father   . Emphysema Father     smoked     Objective:  Patient Vitals for the past 8 hrs:  BP Temp Temp src Pulse Resp SpO2 Height Weight  11/29/13 0345 174/118 mmHg - - 73 13 93 % - -  11/29/13 0315 235/113 mmHg - - 68 18 92 % - -  11/29/13 0300 210/125 mmHg - - 68 15 90 % - -  11/29/13 0230 162/131 mmHg - - 52 12 93 % - -  11/29/13 0200 228/142 mmHg - - 70 20 95 % - -  11/29/13 0120 196/140 mmHg - - 72 22 95 % - -  11/29/13 0115 267/142 mmHg - - 64 14 95 % - -  11/29/13 0100 260/133 mmHg - - 64 21 92 % - -  11/29/13 0045 227/130 mmHg - - 67 21 94 % - -  11/29/13 0030 269/130 mmHg - - 68 19 95 % -  -  11/29/13 0015 221/131 mmHg - - 70 16 93 % - -  11/29/13 0000 209/121 mmHg - - 71 22 91 % - -  11/28/13 2345 266/181 mmHg - - 77 23 93 % - -  11/28/13 2330 209/103 mmHg - - 68 22 95 % - -  11/28/13 2315 222/116 mmHg 98.6 F (37 C) Oral 71 22 92 % - -  11/28/13 2300 198/113 mmHg - - 71 28 94 % - -  11/28/13 2245 187/138 mmHg - - 72 17 96 % - -  11/28/13 2230 220/122 mmHg - - 72 12 95 % - -  11/28/13 2224 234/138 mmHg - - - - - _0  (1.854 m) 134 kg (295 lb 6.7 oz)  11/28/13 2130 199/96 mmHg - - 68 14 88 % - -  11/28/13 2100 187/124 mmHg - - 65 22 93 % - -  11/28/13 2030 157/120 mmHg - - 70 19 88 % - -   General appearance: alert, obese male in mild distress Head: Normocephalic, without obvious abnormality, atraumatic Eyes: conjunctivae/corneas clear. PERRL, EOM's intact.  Neck: no carotid bruit,  Lungs: non labored breathing, no wheezing Chest wall: no tenderness Heart: regular rate and rhythm, S1, S2 normal,  Abdomen: soft, non-tender; bowel sounds normal; no masses,  no organomegaly Extremities: BLE edema Pulses: 2+ and symmetric Neurologic: Grossly normal  Results for orders placed during the hospital encounter of 11/28/13 (from the past 48 hour(s))  CBC     Status: Abnormal   Collection Time    11/28/13 10:50 AM      Result Value Ref Range   WBC 8.1  4.0 - 10.5 K/uL   RBC 5.88 (*) 4.22 - 5.81 MIL/uL   Hemoglobin 18.5 (*) 13.0 - 17.0 g/dL   HCT 53.3 (*) 39.0 - 52.0 %   MCV 90.6  78.0 - 100.0 fL   MCH 31.5  26.0 - 34.0 pg   MCHC 34.7  30.0 - 36.0 g/dL   RDW 13.7  11.5 - 15.5 %   Platelets 122 (*) 150 - 400 K/uL  BASIC METABOLIC PANEL     Status: Abnormal   Collection Time    11/28/13 10:50 AM  Result Value Ref Range   Sodium 140  137 - 147 mEq/L   Potassium 4.1  3.7 - 5.3 mEq/L   Chloride 100  96 - 112 mEq/L   CO2 30  19 - 32 mEq/L   Glucose, Bld 261 (*) 70 - 99 mg/dL   BUN 20  6 - 23 mg/dL   Creatinine, Ser 1.25  0.50 - 1.35 mg/dL   Calcium 8.7  8.4 - 10.5  mg/dL   GFR calc non Af Amer 67 (*) >90 mL/min   GFR calc Af Amer 77 (*) >90 mL/min   Comment: (NOTE)     The eGFR has been calculated using the CKD EPI equation.     This calculation has not been validated in all clinical situations.     eGFR's persistently <90 mL/min signify possible Chronic Kidney     Disease.  PRO B NATRIURETIC PEPTIDE     Status: Abnormal   Collection Time    11/28/13 10:50 AM      Result Value Ref Range   Pro B Natriuretic peptide (BNP) 4116.0 (*) 0 - 125 pg/mL  I-STAT TROPOININ, ED     Status: None   Collection Time    11/28/13 10:59 AM      Result Value Ref Range   Troponin i, poc 0.02  0.00 - 0.08 ng/mL   Comment 3            Comment: Due to the release kinetics of cTnI,     a negative result within the first hours     of the onset of symptoms does not rule out     myocardial infarction with certainty.     If myocardial infarction is still suspected,     repeat the test at appropriate intervals.  URINE RAPID DRUG SCREEN (HOSP PERFORMED)     Status: None   Collection Time    11/28/13  6:43 PM      Result Value Ref Range   Opiates NONE DETECTED  NONE DETECTED   Cocaine NONE DETECTED  NONE DETECTED   Benzodiazepines NONE DETECTED  NONE DETECTED   Amphetamines NONE DETECTED  NONE DETECTED   Tetrahydrocannabinol NONE DETECTED  NONE DETECTED   Barbiturates NONE DETECTED  NONE DETECTED   Comment:            DRUG SCREEN FOR MEDICAL PURPOSES     ONLY.  IF CONFIRMATION IS NEEDED     FOR ANY PURPOSE, NOTIFY LAB     WITHIN 5 DAYS.                LOWEST DETECTABLE LIMITS     FOR URINE DRUG SCREEN     Drug Class       Cutoff (ng/mL)     Amphetamine      1000     Barbiturate      200     Benzodiazepine   160     Tricyclics       109     Opiates          300     Cocaine          300     THC              50  URINALYSIS, ROUTINE W REFLEX MICROSCOPIC     Status: Abnormal   Collection Time    11/28/13  6:44 PM      Result Value Ref Range   Color, Urine  YELLOW  YELLOW   APPearance CLEAR  CLEAR   Specific Gravity, Urine 1.010  1.005 - 1.030   pH 6.0  5.0 - 8.0   Glucose, UA NEGATIVE  NEGATIVE mg/dL   Hgb urine dipstick MODERATE (*) NEGATIVE   Bilirubin Urine NEGATIVE  NEGATIVE   Ketones, ur NEGATIVE  NEGATIVE mg/dL   Protein, ur 100 (*) NEGATIVE mg/dL   Urobilinogen, UA 0.2  0.0 - 1.0 mg/dL   Nitrite NEGATIVE  NEGATIVE   Leukocytes, UA NEGATIVE  NEGATIVE  URINE MICROSCOPIC-ADD ON     Status: Abnormal   Collection Time    11/28/13  6:44 PM      Result Value Ref Range   Squamous Epithelial / LPF FEW (*) RARE   WBC, UA 0-2  <3 WBC/hpf   RBC / HPF 7-10  <3 RBC/hpf   Bacteria, UA RARE  RARE   Casts HYALINE CASTS (*) NEGATIVE  TROPONIN I     Status: None   Collection Time    11/28/13  7:59 PM      Result Value Ref Range   Troponin I <0.30  <0.30 ng/mL   Comment:            Due to the release kinetics of cTnI,     a negative result within the first hours     of the onset of symptoms does not rule out     myocardial infarction with certainty.     If myocardial infarction is still suspected,     repeat the test at appropriate intervals.  MRSA PCR SCREENING     Status: None   Collection Time    11/28/13 10:21 PM      Result Value Ref Range   MRSA by PCR NEGATIVE  NEGATIVE   Comment:            The GeneXpert MRSA Assay (FDA     approved for NASAL specimens     only), is one component of a     comprehensive MRSA colonization     surveillance program. It is not     intended to diagnose MRSA     infection nor to guide or     monitor treatment for     MRSA infections.  TROPONIN I     Status: None   Collection Time    11/29/13 12:17 AM      Result Value Ref Range   Troponin I <0.30  <0.30 ng/mL   Comment:            Due to the release kinetics of cTnI,     a negative result within the first hours     of the onset of symptoms does not rule out     myocardial infarction with certainty.     If myocardial infarction is still  suspected,     repeat the test at appropriate intervals.  BASIC METABOLIC PANEL     Status: Abnormal   Collection Time    11/29/13 12:17 AM      Result Value Ref Range   Sodium 141  137 - 147 mEq/L   Potassium 4.3  3.7 - 5.3 mEq/L   Chloride 99  96 - 112 mEq/L   CO2 30  19 - 32 mEq/L   Glucose, Bld 382 (*) 70 - 99 mg/dL   BUN 30 (*) 6 - 23 mg/dL   Creatinine, Ser 1.85 (*) 0.50 - 1.35 mg/dL   Calcium 8.1 (*) 8.4 -  10.5 mg/dL   GFR calc non Af Amer 41 (*) >90 mL/min   GFR calc Af Amer 48 (*) >90 mL/min   Comment: (NOTE)     The eGFR has been calculated using the CKD EPI equation.     This calculation has not been validated in all clinical situations.     eGFR's persistently <90 mL/min signify possible Chronic Kidney     Disease.  MAGNESIUM     Status: None   Collection Time    11/29/13 12:17 AM      Result Value Ref Range   Magnesium 1.7  1.5 - 2.5 mg/dL   Dg Chest 2 View  11/28/2013   CLINICAL DATA:  Shortness of breath, left chest pain  EXAM: CHEST  2 VIEW  COMPARISON:  10/25/2012  FINDINGS: Cardiomegaly noted with chronic vascular congestion and interstitial prominence. Small pleural effusions noted bilaterally extending along the fissures on lateral view. Streaky mild basilar densities noted, suspect atelectasis or scarring. Trachea is midline. Degenerative changes of the spine.  IMPRESSION: Cardiomegaly with vascular and interstitial prominence with small effusions, compatible with mild CHF.  Basilar atelectasis  No pneumothorax   Electronically Signed   By: Daryll Brod M.D.   On: 11/28/2013 14:54    ECG:  Sinus rhythm HR 79, TWA consider inferior ischemia, cannot r/o anterior infarct, poor r wave progression  Impression: Hypertensive emergency CHF AKI HLD DM  Recommendations: - For additional BP control, would add Labetalol gtt -Titrate down NTG given HA  - Continue with IV diuresis, monitor Cr -Strict I/Os, daily weights - TTE in am - Pain control - As BP  stabilizes on IV regimen, will need to transition to oral medications for continued control

## 2013-11-29 NOTE — Clinical Documentation Improvement (Addendum)
Presents with hypertension, fluid overload, BNP and CXR concerning for CHF; CHF documented by Cardiology.   Pro BNP = 4116  Being treated with IV Lasix 60mg  daily  Please clarify the type and acuity of patient's CHF:   Patient presented with acute on chronic diastolic CHF     Thank You, Zoila Shutter ,RN Clinical Documentation Specialist:  [facility] [employer] [facility]

## 2013-11-29 NOTE — Progress Notes (Signed)
Echocardiogram 2D Echocardiogram has been performed.  Jerry Cantrell 11/29/2013, 10:14 AM

## 2013-11-29 NOTE — Clinical Documentation Improvement (Addendum)
Presents with "Hypertensive Urgency/Emergency", CHF (Cardiology Notes), DM2 with renal and neuropathy manifestations.   The term Hypertension Urgency cannot be indexed by [employer]. It will automatically code to Essential Hypertension.  Please further clarify the type of Hypertension the patient presented with. His BP was 221/130 as per Resident progress note.  Patient presented with Accelerated Hypertension  Thank You, Zoila Shutter ,RN Clinical Documentation Specialist:  [place] [employer]

## 2013-11-29 NOTE — Progress Notes (Signed)
SUBJECTIVE:  No CP. No headache.  OBJECTIVE:   Vitals:   Filed Vitals:   11/29/13 0630 11/29/13 0700 11/29/13 1200 11/29/13 1235  BP: 159/76 131/80 152/81   Pulse: 59 63 57   Temp:  97.8 F (36.6 C)  98.3 F (36.8 C)  TempSrc:  Oral  Axillary  Resp: 14 17 13    Height:      Weight:      SpO2: 96% 93% 94%    I&O's:   Intake/Output Summary (Last 24 hours) at 11/29/13 1503 Last data filed at 11/29/13 1300  Gross per 24 hour  Intake 411.96 ml  Output   2325 ml  Net -1913.04 ml   TELEMETRY: Reviewed telemetry pt in NSR:     PHYSICAL EXAM General: Well developed, well nourished, in no acute distress Head:   Normal cephalic and atramatic  Lungs:   Clear bilaterally to auscultation. Heart:   HRRR S1 S2  No JVD.   Abdomen: abdomen soft and non-tender Msk:  Back normal,  Normal strength and tone for age. Extremities:   Trace edema.   Neuro: Alert and oriented. Psych:  Normal affect, responds appropriately   LABS: Basic Metabolic Panel:  Recent Labs  11/29/13 0017 11/29/13 0354  NA 141 138  K 4.3 3.8  CL 99 98  CO2 30 30  GLUCOSE 382* 331*  BUN 30* 30*  CREATININE 1.85* 1.68*  CALCIUM 8.1* 8.3*  MG 1.7  --    Liver Function Tests: No results found for this basename: AST, ALT, ALKPHOS, BILITOT, PROT, ALBUMIN,  in the last 72 hours No results found for this basename: LIPASE, AMYLASE,  in the last 72 hours CBC:  Recent Labs  11/28/13 1050 11/29/13 0354  WBC 8.1 8.4  HGB 18.5* 16.8  HCT 53.3* 50.1  MCV 90.6 90.3  PLT 122* 112*   Cardiac Enzymes:  Recent Labs  11/29/13 0017 11/29/13 0354 11/29/13 1240  TROPONINI <0.30 <0.30 <0.30   BNP: No components found with this basename: POCBNP,  D-Dimer: No results found for this basename: DDIMER,  in the last 72 hours Hemoglobin A1C:  Recent Labs  11/29/13 0017  HGBA1C 8.9*   Fasting Lipid Panel: No results found for this basename: CHOL, HDL, LDLCALC, TRIG, CHOLHDL, LDLDIRECT,  in the last  72 hours Thyroid Function Tests: No results found for this basename: TSH, T4TOTAL, FREET3, T3FREE, THYROIDAB,  in the last 72 hours Anemia Panel: No results found for this basename: VITAMINB12, FOLATE, FERRITIN, TIBC, IRON, RETICCTPCT,  in the last 72 hours Coag Panel:   No results found for this basename: INR, PROTIME    RADIOLOGY: Dg Chest 2 View  11/28/2013   CLINICAL DATA:  Shortness of breath, left chest pain  EXAM: CHEST  2 VIEW  COMPARISON:  10/25/2012  FINDINGS: Cardiomegaly noted with chronic vascular congestion and interstitial prominence. Small pleural effusions noted bilaterally extending along the fissures on lateral view. Streaky mild basilar densities noted, suspect atelectasis or scarring. Trachea is midline. Degenerative changes of the spine.  IMPRESSION: Cardiomegaly with vascular and interstitial prominence with small effusions, compatible with mild CHF.  Basilar atelectasis  No pneumothorax   Electronically Signed   By: Daryll Brod M.D.   On: 11/28/2013 14:54      ASSESSMENT: HTN  PLAN:  Agree with switching meds to oral. Stressed importance of staying on oral meds. Try to find generics at discharge.  Jettie Booze., MD  11/29/2013  3:03 PM

## 2013-11-29 NOTE — Progress Notes (Signed)
S: Patient reports better breathing after lasix dose from the ED. He reports that he has been urinating. I+O is not accurate.  O: BP 221/131, HR 70, RR20, O2 Sat 93% on RA      General: NAD      Neck: JVD 7-8 cm      Lungs: scattered rhonchi noted posteriorly. No rales or wheezing      LE: 2-3 + pitting edema mid legs. A/P # hypertensive emergency--associated with CHF and AKI    - Tele    -continue NTG drip with goal of BP 180/110-120 ( patient states that his BPs usually run at 180/10-130)  # EKG changes      - EKG on admission showed TWI in the inferior leads and repeat EKG shows resolving TWI. Patient denies chest pain and Troponin are negative x 3     -likely associated with his hypertension emergency      - repeat EKG in am  # HF/Pulmonary edema    -- better after lasix. Still fluid overload on exam. Will give lasix 20 mg IV x1 now.     --Oxygen as needed to keep O2 sat > 92%  # Acute renal failure     Likely associated with hypertensive emergency. Will need close monitor and treat his hypertension aggressively  I have called and discussed with cardiology on call fellow about the management of this patient. Agrees with above plan and no new recommendation.   Charlann Lange, MD PGY-3  [facility]  [phone] pager 4:05 AM

## 2013-11-30 ENCOUNTER — Inpatient Hospital Stay (HOSPITAL_COMMUNITY): Payer: BC Managed Care – PPO

## 2013-11-30 ENCOUNTER — Encounter (HOSPITAL_COMMUNITY): Payer: Self-pay | Admitting: Interventional Cardiology

## 2013-11-30 DIAGNOSIS — I5031 Acute diastolic (congestive) heart failure: Secondary | ICD-10-CM | POA: Diagnosis present

## 2013-11-30 DIAGNOSIS — D696 Thrombocytopenia, unspecified: Secondary | ICD-10-CM

## 2013-11-30 DIAGNOSIS — I5030 Unspecified diastolic (congestive) heart failure: Secondary | ICD-10-CM

## 2013-11-30 DIAGNOSIS — I5033 Acute on chronic diastolic (congestive) heart failure: Secondary | ICD-10-CM

## 2013-11-30 DIAGNOSIS — I110 Hypertensive heart disease with heart failure: Secondary | ICD-10-CM | POA: Diagnosis present

## 2013-11-30 LAB — CBC
HCT: 49.8 % (ref 39.0–52.0)
HEMOGLOBIN: 16.5 g/dL (ref 13.0–17.0)
MCH: 30.7 pg (ref 26.0–34.0)
MCHC: 33.1 g/dL (ref 30.0–36.0)
MCV: 92.6 fL (ref 78.0–100.0)
Platelets: 100 10*3/uL — ABNORMAL LOW (ref 150–400)
RBC: 5.38 MIL/uL (ref 4.22–5.81)
RDW: 14.1 % (ref 11.5–15.5)
WBC: 7.8 10*3/uL (ref 4.0–10.5)

## 2013-11-30 LAB — BASIC METABOLIC PANEL
BUN: 23 mg/dL (ref 6–23)
CHLORIDE: 99 meq/L (ref 96–112)
CO2: 29 mEq/L (ref 19–32)
Calcium: 8.5 mg/dL (ref 8.4–10.5)
Creatinine, Ser: 1.4 mg/dL — ABNORMAL HIGH (ref 0.50–1.35)
GFR calc non Af Amer: 58 mL/min — ABNORMAL LOW (ref >90)
GFR, EST AFRICAN AMERICAN: 67 mL/min — AB (ref >90)
Glucose, Bld: 133 mg/dL — ABNORMAL HIGH (ref 70–99)
POTASSIUM: 3.8 meq/L (ref 3.7–5.3)
Sodium: 139 mEq/L (ref 137–147)

## 2013-11-30 LAB — GLUCOSE, CAPILLARY
GLUCOSE-CAPILLARY: 196 mg/dL — AB (ref 70–99)
GLUCOSE-CAPILLARY: 249 mg/dL — AB (ref 70–99)
Glucose-Capillary: 423 mg/dL — ABNORMAL HIGH (ref 70–99)
Glucose-Capillary: 231 mg/dL — ABNORMAL HIGH (ref 70–99)

## 2013-11-30 MED ORDER — TRAMADOL HCL 50 MG PO TABS
50.0000 mg | ORAL_TABLET | Freq: Two times a day (BID) | ORAL | Status: DC | PRN
  Administered 2013-12-01 – 2013-12-04 (×6): 50 mg via ORAL
  Filled 2013-11-30 (×6): qty 1

## 2013-11-30 MED ORDER — AMLODIPINE BESYLATE 10 MG PO TABS
10.0000 mg | ORAL_TABLET | Freq: Every day | ORAL | Status: DC
  Administered 2013-11-30 – 2013-12-04 (×5): 10 mg via ORAL
  Filled 2013-11-30 (×5): qty 1

## 2013-11-30 MED ORDER — FUROSEMIDE 20 MG PO TABS
20.0000 mg | ORAL_TABLET | Freq: Every day | ORAL | Status: DC
  Administered 2013-11-30 – 2013-12-01 (×2): 20 mg via ORAL
  Filled 2013-11-30 (×4): qty 1

## 2013-11-30 NOTE — Progress Notes (Signed)
Internal Medicine Attending  Date: 11/30/2013  Patient name: Jerry Cantrell Medical record number: [id number] Date of birth: 22-Apr-1966 Age: 48 y.o. Gender: male  I saw and evaluated the patient, and discussed his care on A.M rounds with housestaff.  I reviewed the resident's note by Dr. Mechele Claude and I agree with the resident's findings and plans as documented in her note.

## 2013-11-30 NOTE — Progress Notes (Signed)
SUBJECTIVE:  No CP. No headache.  OBJECTIVE:   Vitals:   Filed Vitals:   11/30/13 0800 11/30/13 0915 11/30/13 0925 11/30/13 1200  BP: 196/105 178/86 178/86 136/92  Pulse: 75   67  Temp:    98.7 F (37.1 C)  TempSrc:    Oral  Resp: 11   16  Height:      Weight:      SpO2: 96% 94%  99%   I&O's:    Intake/Output Summary (Last 24 hours) at 11/30/13 1319 Last data filed at 11/30/13 1200  Gross per 24 hour  Intake    120 ml  Output   1400 ml  Net  -1280 ml   TELEMETRY: Reviewed telemetry pt in NSR:     PHYSICAL EXAM General: Well developed, well nourished, in no acute distress Head:   Normal cephalic and atramatic  Lungs:   Clear bilaterally to auscultation. Heart:   HRRR S1 S2  No JVD.   Abdomen: abdomen soft and non-tender Msk:  Back normal,  Normal strength and tone for age. Extremities:   Trace edema.   Neuro: Alert and oriented. Psych:  Normal affect, responds appropriately   LABS: Basic Metabolic Panel:  Recent Labs  11/29/13 0017  11/29/13 1240 11/30/13 0415  NA 141  < > 138 139  K 4.3  < > 4.3 3.8  CL 99  < > 97 99  CO2 30  < > 29 29  GLUCOSE 382*  < > 353* 133*  BUN 30*  < > 28* 23  CREATININE 1.85*  < > 1.69* 1.40*  CALCIUM 8.1*  < > 8.3* 8.5  MG 1.7  --   --   --   < > = values in this interval not displayed. Liver Function Tests: No results found for this basename: AST, ALT, ALKPHOS, BILITOT, PROT, ALBUMIN,  in the last 72 hours No results found for this basename: LIPASE, AMYLASE,  in the last 72 hours CBC:  Recent Labs  11/29/13 0354 11/30/13 0415  WBC 8.4 7.8  HGB 16.8 16.5  HCT 50.1 49.8  MCV 90.3 92.6  PLT 112* 100*   Cardiac Enzymes:  Recent Labs  11/29/13 0017 11/29/13 0354 11/29/13 1240  TROPONINI <0.30 <0.30 <0.30   BNP: No components found with this basename: POCBNP,  D-Dimer: No results found for this basename: DDIMER,  in the last 72 hours Hemoglobin A1C:  Recent Labs  11/29/13 0017  HGBA1C 8.9*    Fasting Lipid Panel: No results found for this basename: CHOL, HDL, LDLCALC, TRIG, CHOLHDL, LDLDIRECT,  in the last 72 hours Thyroid Function Tests: No results found for this basename: TSH, T4TOTAL, FREET3, T3FREE, THYROIDAB,  in the last 72 hours Anemia Panel: No results found for this basename: VITAMINB12, FOLATE, FERRITIN, TIBC, IRON, RETICCTPCT,  in the last 72 hours Coag Panel:   No results found for this basename: INR,  PROTIME    RADIOLOGY: Dg Chest 2 View  11/28/2013   CLINICAL DATA:  Shortness of breath, left chest pain  EXAM: CHEST  2 VIEW  COMPARISON:  10/25/2012  FINDINGS: Cardiomegaly noted with chronic vascular congestion and interstitial prominence. Small pleural effusions noted bilaterally extending along the fissures on lateral view. Streaky mild basilar densities noted, suspect atelectasis or scarring. Trachea is midline. Degenerative changes of the spine.  IMPRESSION: Cardiomegaly with vascular and interstitial prominence with small effusions, compatible with mild CHF.  Basilar atelectasis  No pneumothorax   Electronically Signed  By: Daryll Brod M.D.   On: 11/28/2013 14:54      ASSESSMENT: HTN  PLAN:  Agree with switching meds to oral. Stressed importance of staying on oral meds. Try to find generics at discharge.  Explained to him that uncontrolled HTN could cause systolic dysfunction.  Acute on chronic diastolic heart failure.  Diuresing.  Could give a dose of IV lasix as well rather than the PO lasix.   Jettie Booze., MD  11/30/2013  1:19 PM

## 2013-11-30 NOTE — Evaluation (Signed)
Physical Therapy Evaluation Patient Details Name: Jerry Cantrell MRN: [id number] DOB: [birth year] Today's Date: 11/30/2013   History of Present Illness  Patient is a 47 year old man with history of hypertension, hyperlipidemia, type 2 diabetes mellitus, chronic low back pain with intermittent sciatic pain, and other problems as outlined in the medical history who presented with low back pain and right leg pain which caused him to fall yesterday, but was found to be severely hypertensive with clinical evidence of heart failure and therefore was admitted for inpatient care.  Patient reports that his low back pain and sciatic pain are chronic and intermittent; he denies any persistent neurologic deficit.  He does report progressive chronic exertional dyspnea aggravated by even minimal exertion, and bilateral lower extremity edema.  He reports that he has difficulty affording his medications and therefore has not been taking them consistently.  Clinical Impression  Pt very pleasant and eager to learn how to decrease back pain. Pt states he hadn't followed up with sports medicine clinic for grossly 77mo due to feeling fine and feels his back pain starting again after working on his Lucianne Lei. Pt educated for back precautions and mobility with handout and encouraged follow up with MD prior to OPPT. Pt currently with decreased strength, function, mobility and pain as well as below deficits all impairing his independence and will benefit from acute therapy to address deficits and decrease burden of care. Recommend daily ambulation with RW and staff as well as OOB to chair for all meals.   Follow Up Recommendations Outpatient PT    Equipment Recommendations  Rolling walker with 5" wheels    Recommendations for Other Services OT consult     Precautions / Restrictions Precautions Precautions: Back;Fall Precaution Booklet Issued: Yes (comment) Precaution Comments: precautions to help decrease pain and increase  mobility      Mobility  Bed Mobility Overal bed mobility: Needs Assistance Bed Mobility: Supine to Sit     Supine to sit: Supervision     General bed mobility comments: cues for sequence pt with HOB 35 on arrival and refused HOB flat due to pain but educated for rolling for back precautions  Transfers Overall transfer level: Needs assistance   Transfers: Sit to/from Stand;Stand Pivot Transfers Sit to Stand: Min guard Stand pivot transfers: Min guard       General transfer comment: pt able to stand and pivot bed to recliner but on second stand from recliner pt with Right knee buckling with min assist and bil UE support on RW to maintain balance with increased time to achieve upright for gait  Ambulation/Gait Ambulation/Gait assistance: Min guard Ambulation Distance (Feet): 140 Feet Assistive device: Rolling walker (2 wheeled) Gait Pattern/deviations: Step-through pattern;Decreased stride length;Trunk flexed   Gait velocity interpretation: Below normal speed for age/gender    Stairs            Wheelchair Mobility    Modified Rankin (Stroke Patients Only)       Balance                                             Pertinent Vitals/Pain BP 164/86 supine prior to activity 178/86 sitting after activity Pain 6/10 HR 83 sats 90-94% on 2L    Home Living Family/patient expects to be discharged to:: Private residence Living Arrangements: Spouse/significant other Available Help at Discharge: Friend(s);Available PRN/intermittently Type of Home: [place]  Access: Stairs to enter Entrance Stairs-Rails: Right Entrance Stairs-Number of Steps: 14 Home Layout: One level Home Equipment: None      Prior Function Level of Independence: Independent         Comments: Pt typically works fulltime in [employer], occasionally needs help from girlfriend for socks     Hand Dominance        Extremity/Trunk Assessment   Upper Extremity  Assessment: Overall WFL for tasks assessed           Lower Extremity Assessment: Generalized weakness (unable to fully assess due to back pain)         Communication   Communication: No difficulties  Cognition Arousal/Alertness: Awake/alert Behavior During Therapy: WFL for tasks assessed/performed Overall Cognitive Status: Within Functional Limits for tasks assessed                      General Comments      Exercises        Assessment/Plan    PT Assessment Patient needs continued PT services  PT Diagnosis Difficulty walking;Acute pain;Generalized weakness   PT Problem List Decreased strength;Decreased activity tolerance;Decreased mobility;Cardiopulmonary status limiting activity;Pain;Decreased knowledge of precautions;Decreased knowledge of use of DME;Obesity  PT Treatment Interventions Gait training;Stair training;Functional mobility training;Therapeutic activities;Therapeutic exercise;Patient/family education;DME instruction   PT Goals (Current goals can be found in the Care Plan section) Acute Rehab PT Goals Patient Stated Goal: be able to return to work PT Goal Formulation: With patient Time For Goal Achievement: 12/14/13 Potential to Achieve Goals: Good    Frequency Min 3X/week   Barriers to discharge Decreased caregiver support      Co-evaluation               End of Session Equipment Utilized During Treatment: Gait belt;Oxygen Activity Tolerance: Patient limited by pain Patient left: in chair;with call bell/phone within reach Nurse Communication: Mobility status         Time: [id number] PT Time Calculation (min): 30 min   Charges:   PT Evaluation $Initial PT Evaluation Tier I: 1 Procedure PT Treatments $Gait Training: 8-22 mins $Therapeutic Activity: 8-22 mins   PT G Codes:          Melford Aase 11/30/2013, 9:20 AM Elwyn Reach, [place]

## 2013-11-30 NOTE — Discharge Summary (Signed)
Name: Jerry Cantrell MRN: [id number] DOB: 03-09-1966 48 y.o. PCP: Otho Bellows, MD  Date of Admission: 11/28/2013 11:02 AM Date of Discharge: 12/04/2013 Attending Physician: Dr. Marinda Elk   Discharge Diagnosis: Principal Problem:   Hypertensive emergency/accelerated HTN- likely 2/2 medication noncompliance Active Problems:   Smoker   Diabetic peripheral neuropathy associated with type 2 diabetes mellitus   Leg swelling   Volume overload 2/2 hypertensive emergency   Radiculopathy with lower extremity symptoms   Diastolic heart failure secondary to hypertension   Acute diastolic heart failure (grade 1 diastolic dysfunction)    Acute on chronic kidney disease 2/2 hypertensive emergency  Discharge Medications:   Medication List    STOP taking these medications       nebivolol 10 MG tablet  Commonly known as:  BYSTOLIC      TAKE these medications       albuterol 108 (90 BASE) MCG/ACT inhaler  Commonly known as:  PROVENTIL HFA;VENTOLIN HFA  Inhale 2 puffs into the lungs every 4 (four) hours as needed for wheezing or shortness of breath.     amLODipine 10 MG tablet  Commonly known as:  NORVASC  Take 1 tablet (10 mg total) by mouth daily.     aspirin 81 MG chewable tablet  Chew 1 tablet (81 mg total) by mouth daily.     carvedilol 25 MG tablet  Commonly known as:  COREG  Take 25 mg by mouth 2 (two) times daily with a meal.     furosemide 40 MG tablet  Commonly known as:  LASIX  Take 1 tablet (40 mg total) by mouth daily.     hydrALAZINE 100 MG tablet  Commonly known as:  APRESOLINE  Take 1 tablet (100 mg total) by mouth 3 (three) times daily.     insulin aspart 100 UNIT/ML injection  Commonly known as:  novoLOG  Inject 4-8 Units into the skin 3 (three) times daily before meals.     insulin glargine 100 UNIT/ML injection  Commonly known as:  LANTUS  Inject 25 Units into the skin 2 (two) times daily.     olmesartan 40 MG tablet  Commonly known as:  BENICAR  Take  1 tablet (40 mg total) by mouth daily.        Disposition and follow-up:   [name] was discharged from [facility] in Stable condition.  At the hospital follow up visit please address:  1.  Thrombocytopenia- unknown etiology. Need to monitor w/ f/u CBC. 2.  HTN emergency- recheck blood pressure and assess medication compliance 3.  Uncontrolled DM- patient w/ hx of medication noncompliance 2/2 financial constraints. He was given free samples of both his novolog and lantus AND coupon for both lantus and novolog (should be $25/month supply each). He had been buying insulin off the street. 4. HLD- patient would likely benefit from statin, though given hx of medication noncompliance this was not started as inpatient.  5. Grade 1 Diastolic CHF- patient needs a scale at home. He said he would obtain one. Please assess. Please remind patient that he needs to follow up with cardiology (per below). 6. Hypoxia w/ ambulation- this resolved at time of discharge (SpO2 maintained at 94% w/ ambulation), but please assess in the office at f/u  2.  Labs / imaging needed at time of follow-up: CBC, BMP  3.  Pending labs/ test needing follow-up: none  Follow-up Appointments: Follow-up Information   Follow up with Newt Minion, MD On 12/04/2013. (1:15pm)  Specialty:  Orthopedic Surgery   Contact information:   [place] [address] [phone]       Follow up with Marrion Coy, MD On 12/08/2013. (1:15pm )    Specialty:  Internal Medicine   Contact information:   [place] [address] [phone]       Follow up with [facility]. (Services to be started post hospital discharge.)    Contact information:   [address] [place], [place] [address] [phone] phone      Follow up with Jettie Booze., MD On 12/18/2013. (11:45am )    Specialty:  Interventional Cardiology   Contact  information:   [id number] [place] [address] [phone]       Discharge Instructions: Discharge Orders   Future Appointments Provider Department Dept Phone   12/08/2013 1:15 PM Marrion Coy, MD [place] [phone]   12/18/2013 11:45 AM Imogene Burn, PA-C [place] Office [phone]   Future Orders Complete By Expires   (HEART FAILURE PATIENTS) Call MD:  Anytime you have any of the following symptoms: 1) 3 pound weight gain in 24 hours or 5 pounds in 1 week 2) shortness of breath, with or without a dry hacking cough 3) swelling in the hands, feet or stomach 4) if you have to sleep on extra pillows at night in order to breathe.  As directed    Call MD for:  difficulty breathing, headache or visual disturbances  As directed    Call MD for:  severe uncontrolled pain  As directed    Diet - low sodium heart healthy  As directed    Increase activity slowly  As directed       Consultations:  cardiology  Procedures Performed:  Dg Chest 2 View  11/28/2013   CLINICAL DATA:  Shortness of breath, left chest pain  EXAM: CHEST  2 VIEW  COMPARISON:  10/25/2012  FINDINGS: Cardiomegaly noted with chronic vascular congestion and interstitial prominence. Small pleural effusions noted bilaterally extending along the fissures on lateral view. Streaky mild basilar densities noted, suspect atelectasis or scarring. Trachea is midline. Degenerative changes of the spine.  IMPRESSION: Cardiomegaly with vascular and interstitial prominence with small effusions, compatible with mild CHF.  Basilar atelectasis  No pneumothorax   Electronically Signed   By: Daryll Brod M.D.   On: 11/28/2013 14:54   Mr Lumbar Spine Wo Contrast  11/29/2013   CLINICAL DATA:  Low back and right leg pain. Right lower extremity radiculopathy.  EXAM: MRI LUMBAR SPINE WITHOUT CONTRAST  TECHNIQUE: Multiplanar, multisequence MR imaging was performed. No intravenous  contrast was administered.  COMPARISON:  MRI dated 01/28/2012  FINDINGS: Normal conus at T12-L1.  T12-L1 through L3-4:  Normal.  L4-5: Progressive facet arthritis with new joint effusions. Right far lateral disc bulge is immediately adjacent to the exiting right L4 nerve and knee irritated. The bulge actually appears less prominent than on the prior study.  L5-S1: Left pars defect. Mild right facet arthritis, unchanged. The disc is normal. 1.5 mm spondylolisthesis.  IMPRESSION: 1. Progressive inflammation of the facet joints at L4-5 with progressive joint effusions. 2. Right far lateral disc bulge, reduced in size. 3. No other change.  No focal neural impingement.   Electronically Signed   By: Rozetta Nunnery M.D.   On: 11/29/2013 17:30   Dg Lumb Spine Flex&ext Only  11/30/2013   CLINICAL DATA:  48 year old male with severe low back pain radiating to the right lower extremity. Initial encounter.  EXAM: LUMBAR SPINE FLEX AND EXTEND ONLY - 2-3 VIEW  COMPARISON:  Lumbar MRI 11/29/2013.  Lumbar radiographs 01/14/2012.  FINDINGS: Same numbering system as on 11/29/2013. Lumbar segmentation is normal. Bulky anterior T11-T12 endplate osteophytes. Grossly intact visible lower thoracic levels.  Lateral views in neutral flexion and extension. No abnormal motion identified.  IMPRESSION: No abnormal motion in the lumbar spine with flexion extension. No acute osseous abnormality identified.   Electronically Signed   By: Lars Pinks M.D.   On: 11/30/2013 11:24   2D Echo: 11/29/13 Study Conclusions  - Left ventricle: The cavity size was normal. There was severe concentric hypertrophy with no evidence of LVOT obstruction. Systolic function was normal. The estimated ejection fraction was in the range of 50% to 55%. Wall motion was normal; there were no regional wall motion abnormalities. Doppler parameters are consistent with abnormal left ventricular relaxation (grade 1 diastolic dysfunction). Doppler parameters are  consistent with elevated ventricular end-diastolic filling pressure. - Aortic valve: No regurgitation. - Mitral valve: Trivial regurgitation. - Right ventricle: Systolic function was normal. - Right atrium: The atrium was normal in size. - Tricuspid valve: No regurgitation. - Pulmonary arteries: Systolic pressure was within the normal range. - Inferior vena cava: The vessel was normal in size.  Admission HPI:  Donovyn Altermatt is a 48 year old male with past medical history of hypertension, hyperlipidemia, insulin-dependent Type II DM, chronic low back pain, and tobacco abuse who presents with recent fall and resulting low back and leg pain.   Pt reports that 1 day ago his right leg gave out with resulting fall (x2) causing twisting of nerve in his back and as a result worsening low back pain and shooting pain with numbness and tingling down both legs (which he has had before). He reports he difficulty ambulating and tried placing ice packs on his back with no relief. He also took ibuprofen which only mildly improved the pain. He reports he had recent falls in the past couple of months. He has baseline neuropathy in his feet due to diabetes. He denies recent weight loss, weakness, loss of bladder/bowel function, or worsening neuropathy.   He has chronic dyspnea on exertion with no worsening shortness of breath. He also has chronic LE swelling for the past 8 months which is not worse than normal. He denies orthopnea or PND. He is compliant with lasix therapy at home with no recent weight gain or dietary indiscretion.   He reports compliance with his home anti-hypertensive (did not take lasix this morning) and has mild headache with no chest pain, dyspnea, palpitations, vision changes (blurry vision at baseline), or decreased urine output.   Hospital Course by problem list: Accelerated Hypertension with volume overload 2/2 acute on chronic diastolic CHF- Pt presented with blood pressure of 221/130  with AKI (Cr 1.25-->1.85-->1.68), T wave inversion in inferior leads, and dCHF exacerbation (pro-BNP of 4116 CXR with cardiomegaly with vascular and interstitial prominence with small effusions concerning for CHF). Patient admits to poor compliance with his home medication regimen (admission regimen: carvedilol 25 mg BID, olmesartan 40 mg daily, nebivolol 20 mg daily, and furosemide 20 mg). Initially obtained blood pressure control with nitroglycerin and labetalol gtts w/ lasix 60mg  IV daily, pt in SDU. Troponin negative x 3, ACS ruled out. Echo showed concentric hypertrophy of LV with normal EF and grade 1 diastolic dysfunction. On HD 2, NTG and labetalol gtts were weaned  with addition of coreg 25mg  BID, continued lasix for diuresis. Cardiology was consulted and assisted with medication management. We gradually titrated medications and ultimately patient was discharged on coreg 25mg  BID, amlodipine 10mg  daily, lasix to 40 PO, irbesartan 300mg  daily (substitute for benicar per formulary), hydralazine 100mg  TID. Blood pressure control still not at goal (last read 160s/low 100s). These medications will need a few days to reach steady state and he will need blood pressure rechecked at his f/u visit. Patient's Cr trended down prior to discharge and his breathing was at baseline (mild DOE). Patient was hypoxic with ambulation during the first few days of admission likely from pulmonary edema, though at discharge he was not hypoxic with ambulation (SpO2 maintained at 94%) so was not discharged with home O2. At discharge, he was net negative 7L since his admission and euvolemic on exam. Discussed with patient and his girlfriend the importance of medication compliance. He made the plan to have his son pay for his medications. I calculated his monthly medication cost for treatment of DM and HTN which should be approx $70-80. Cardiology was in agreement with plan to discharge patient with close follow up later this week.    Intermittent Mobitz Type 1 AV block: Occurred with sleep the last two nights of admission. Per cards this is a benign finding and pt can continue coreg at current dose. However, if this persists we may need to down titrate the coreg. No further work up recommended. Outpatient f/u with cardiology on 4/20 scheduled.  Mechanical fall in setting of lumbar spondylosis and stenosis - On admission, pt reported 2 recent falls due to right leg giving out (most likely due to diabetic neuropathy) with resulting worsening of low back pain and shooting pain in legs. Straight leg test negative with no alarm symptoms. Pt with history of lumbar spondylosis and spinal stenosis. MRI performed during admission showed progressive inflammation of facet joints at L4-5 w/ progressive joint effusions; R far lateral disc bulge which is reduced in size compared with MR from 12/2011. No focal neural impingement. Per recommendation of radiology we obtained lumbar flexion/extension xrays, which were negative for abnormal movement of spine. PT recommended outpatient PT and 5 wheel rolling walker, so this was arranged for patient. Appointment with Dr. Sharol Given scheduled for April 6 at 1:15pm. Patient c/o severe back pain, which may be contributing to his difficult to control HTN. He was managed on tramadol and given only 2 doses of percocet. There is concern for drug abuse given UDS +cocaine in February 2015, so PCP (Dr. Eulas Post) wanted to hold off on giving abundant narcotics. Would prefer if Ortho managed his back pain medications.   Insulin-dependent Type II Diabetes Mellitus - Pt at home on Lantus 25 U BID and Novolog 4-8 U TID before meals. A1c 8.9% as of 11/29/13. Patient was managed on lantus 23U BID and novolog 6U TID WC w/ SSI. Carb mod diet given. I gave patient free samples of his lantus and novolog and CM provided patient with coupons for both of these medications to use next month which would make each of those types of insulin  $25/month supply. We did investigate whether 70/30 mix would be more affordable for pt, but this is not the case per CM.  Thrombocytopenia - Pt with platelet count of 122K on admission with past history of low levels. Trended down slightly to 100K. No active bleeding. Will need follow up CBC.  Hyperlipidemia - Last lipid panel on 05/11/13 with hypercholesteremia (219),  hypertriglyceridemia (400), low HDL (39), and high LDL (100). Consider initiating statin therapy (LFT normal on 10/25/13). Statin not started as inpatient as patient has hx of medication noncompliance and we had already made quite a few medication changes during his admission.  Substance Abuse - UDS negative on admission. Pt with cocaine positive UDS on 10/25/13. Patient currently smokes 1/2ppd. Encourage tobacco cessation and for patient use this money and put toward paying for his medications as he only makes $250/two weeks.   Discharge Vitals:   BP 177/102  Pulse 76  Temp(Src) 97.9 F (36.6 C) (Oral)  Resp 20  Ht 6\' 1"  (1.854 m)  Wt 291 lb 6.4 oz (132.178 kg)  BMI 38.45 kg/m2  SpO2 97%  Discharge Labs:  Results for orders placed during the hospital encounter of 11/28/13 (from the past 24 hour(s))  GLUCOSE, CAPILLARY     Status: Abnormal   Collection Time    12/04/13  6:29 AM      Result Value Ref Range   Glucose-Capillary 118 (*) 70 - 99 mg/dL   Comment 1 Notify RN    GLUCOSE, CAPILLARY     Status: Abnormal   Collection Time    12/04/13 11:11 AM      Result Value Ref Range   Glucose-Capillary 262 (*) 70 - 99 mg/dL   Comment 1 Notify RN      Signed: Rebecca Eaton, MD 12/04/2013, 9:50 PM   Time Spent on Discharge: 35 minutes Services Ordered on Discharge: outpatient PT Equipment Ordered on Discharge: rolling walker

## 2013-11-30 NOTE — Progress Notes (Signed)
Visit to patient while in hospital. Will call after patient is discharged to assist with transition of care. 

## 2013-11-30 NOTE — Progress Notes (Signed)
Subjective: Patient's main complaint this morning is his back pain. He had one episode of SOB this morning, but only lasted a few minutes and occurred while he was trying to stand up and walk to use the bathroom. He reports having 2 episodes of sharp chest pain in L chest that each last a few minutes and resolve spontaneously. This same type of pain has occurred intermittently for months to years and is not new.  Objective: Vital signs in last 24 hours: Filed Vitals:   11/30/13 0500 11/30/13 0600 11/30/13 0700 11/30/13 0800  BP: 166/107 176/95  196/105  Pulse: 64 71 66 75  Temp:      TempSrc:      Resp: 14 10 13 11   Height:      Weight:      SpO2: 96% 91% 97% 96%   Weight change: -0.3 kg (-10.6 oz)  Intake/Output Summary (Last 24 hours) at 11/30/13 0901 Last data filed at 11/30/13 0800  Gross per 24 hour  Intake    120 ml  Output   2000 ml  Net  -1880 ml   Physical Exam General: alert, cooperative, and in no apparent distress; patient was in significant back pain when he sat up in bed HEENT: vision grossly intact, oropharynx clear and non-erythematous  Neck: supple Lungs: lungs CTAB, normal work of respiration, no wheezes, rhonchi or rales Heart: regular rate and rhythm, no murmurs, gallops, or rubs Abdomen: soft, non-tender, non-distended, normal bowel sounds Extremities: trace pitting edema b/l, warm extremities  Neurologic: alert & oriented X3, cranial nerves II-XII grossly intact, strength grossly intact, sensation intact to light touch  Lab Results: Basic Metabolic Panel:  Recent Labs Lab 11/29/13 0017  11/29/13 1240 11/30/13 0415  NA 141  < > 138 139  K 4.3  < > 4.3 3.8  CL 99  < > 97 99  CO2 30  < > 29 29  GLUCOSE 382*  < > 353* 133*  BUN 30*  < > 28* 23  CREATININE 1.85*  < > 1.69* 1.40*  CALCIUM 8.1*  < > 8.3* 8.5  MG 1.7  --   --   --   < > = values in this interval not displayed. CBC:  Recent Labs Lab 11/29/13 0354 11/30/13 0415  WBC 8.4 7.8    HGB 16.8 16.5  HCT 50.1 49.8  MCV 90.3 92.6  PLT 112* 100*   Cardiac Enzymes:  Recent Labs Lab 11/29/13 0017 11/29/13 0354 11/29/13 1240  TROPONINI <0.30 <0.30 <0.30   BNP:  Recent Labs Lab 11/28/13 1050  PROBNP 4116.0*   CBG:  Recent Labs Lab 11/28/13 2144 11/29/13 0735 11/29/13 1238 11/29/13 1709 11/29/13 2137 11/30/13 0819  GLUCAP 319* 309* 401* 163* 140* 196*   Urine Drug Screen: Drugs of Abuse     Component Value Date/Time   LABOPIA NONE DETECTED 11/28/2013 1843   LABOPIA NEG 06/02/2012 0945   COCAINSCRNUR NONE DETECTED 11/28/2013 1843   COCAINSCRNUR NEG 06/02/2012 0945   LABBENZ NONE DETECTED 11/28/2013 1843   LABBENZ NEG 06/02/2012 0945   LABBENZ NEG 02/18/2010 2054   AMPHETMU NONE DETECTED 11/28/2013 1843   AMPHETMU NEG 02/18/2010 2054   THCU NONE DETECTED 11/28/2013 1843   LABBARB NONE DETECTED 11/28/2013 1843   LABBARB NEG 06/02/2012 0945   Urinalysis:  Recent Labs Lab 11/28/13 1844  COLORURINE YELLOW  LABSPEC 1.010  PHURINE 6.0  GLUCOSEU NEGATIVE  HGBUR MODERATE*  BILIRUBINUR NEGATIVE  KETONESUR NEGATIVE  PROTEINUR 100*  UROBILINOGEN 0.2  NITRITE NEGATIVE  LEUKOCYTESUR NEGATIVE    Micro Results: Recent Results (from the past 240 hour(s))  MRSA PCR SCREENING     Status: None   Collection Time    11/28/13 10:21 PM      Result Value Ref Range Status   MRSA by PCR NEGATIVE  NEGATIVE Final   Comment:            The GeneXpert MRSA Assay (FDA     approved for NASAL specimens     only), is one component of a     comprehensive MRSA colonization     surveillance program. It is not     intended to diagnose MRSA     infection nor to guide or     monitor treatment for     MRSA infections.   Studies/Results: Dg Chest 2 View  11/28/2013   CLINICAL DATA:  Shortness of breath, left chest pain  EXAM: CHEST  2 VIEW  COMPARISON:  10/25/2012  FINDINGS: Cardiomegaly noted with chronic vascular congestion and interstitial prominence. Small pleural  effusions noted bilaterally extending along the fissures on lateral view. Streaky mild basilar densities noted, suspect atelectasis or scarring. Trachea is midline. Degenerative changes of the spine.  IMPRESSION: Cardiomegaly with vascular and interstitial prominence with small effusions, compatible with mild CHF.  Basilar atelectasis  No pneumothorax   Electronically Signed   By: Daryll Brod M.D.   On: 11/28/2013 14:54   Mr Lumbar Spine Wo Contrast  11/29/2013   CLINICAL DATA:  Low back and right leg pain. Right lower extremity radiculopathy.  EXAM: MRI LUMBAR SPINE WITHOUT CONTRAST  TECHNIQUE: Multiplanar, multisequence MR imaging was performed. No intravenous contrast was administered.  COMPARISON:  MRI dated 01/28/2012  FINDINGS: Normal conus at T12-L1.  T12-L1 through L3-4:  Normal.  L4-5: Progressive facet arthritis with new joint effusions. Right far lateral disc bulge is immediately adjacent to the exiting right L4 nerve and knee irritated. The bulge actually appears less prominent than on the prior study.  L5-S1: Left pars defect. Mild right facet arthritis, unchanged. The disc is normal. 1.5 mm spondylolisthesis.  IMPRESSION: 1. Progressive inflammation of the facet joints at L4-5 with progressive joint effusions. 2. Right far lateral disc bulge, reduced in size. 3. No other change.  No focal neural impingement.   Electronically Signed   By: Rozetta Nunnery M.D.   On: 11/29/2013 17:30   Medications: I have reviewed the patient's current medications. Scheduled Meds: . amLODipine  10 mg Oral Daily  . aspirin  81 mg Oral Daily  . carvedilol  25 mg Oral BID WC  . furosemide  60 mg Intravenous Daily  . insulin aspart  0-20 Units Subcutaneous TID WC  . insulin aspart  0-5 Units Subcutaneous QHS  . insulin glargine  20 Units Subcutaneous BID  . sodium chloride  3 mL Intravenous Q12H   Continuous Infusions: . sodium chloride Stopped (11/29/13 1700)  . labetalol (NORMODYNE) infusion Stopped  (11/29/13 1700)   PRN Meds:.albuterol, hydrALAZINE, morphine injection, ondansetron (ZOFRAN) IV, oxyCODONE-acetaminophen, traMADol  Assessment/Plan: Accelerated Hypertension with volume overload 2/2 acute on chronic diastolic CHF- Blood pressure ranged 156-219/95-107 overnight. After receiving his morning coreg and adding amlodipine 10mg  daily, patient's blood pressure is more well controlled, 178/86. Will stop lasix 60mg  IV and switch to lasix 20mg  PO. Echo showed concentric hypertrophy of LV with normal EF and grade 1 diastolic dysfunction.  -transfer to telemetry  -added amlodipine 10mg  daily  -stop IV  lasix and add PO lasix -no events on tele -cardiology consult, appreciate recs -daily weights, I/Os -continue home carvedilol 25 mg BID  -hold home olmesartan 40 mg daily and nebivolol 20 mg daily  -consider outpatient PFTs given smoking hx  -f/u with [facility] scheduled for April 10 at 1:15pm  Mechanical fall in setting of degenerative changes on MRI: MRI yesterday shows progressive inflammation of facet joints at L4-5 w/ progressive joint effusions; R far lateral disc bulge which is reduced in size compared with MR from 12/2011. No focal neural impingement. Patient with subjective radiculopathy and leg "giving out" recently which caused his fall. Per recommendation of radiology we obtained lumbar flexion/extension xrays, which were negative for abnormal movement. Will obtain orthopaedic follow up for patient given his significant pain and interference of pain w/ ambulation. -PT recommends outpatient PT and 5 wheel rolling walker (ordered) -appointment with Dr. Sharol Given scheduled for April 6 at 1:15pm  Insulin-dependent Type II Diabetes Mellitus -  Pt at home on Lantus 25 U BID and Novolog 4-8 U TID before meals. A1c 8.9% as of 11/29/13.  -continue lantus to 20U BID; will ask CM to evaluate if 70/30 would be more affordable for patient  -Insulin sliding scale with meals and at bedtime  -Carb modified  diet   Thrombocytopenia - Relatively stable, but PLT remain low. Currently with no active bleeding.  -Monitor CBC in setting of heparin use  -Monitor for bleeding   Hyperlipidemia - Last lipid panel on 05/11/13 with hypercholesteremia (219), hypertriglyceridemia (400), low HDL (39), and high LDL (100).  -Consider initiating statin therapy (LFT normal on 10/25/13)   Substance Abuse - UDS negative on admission. Pt with cocaine positive UDS on 10/25/13. Patient currently smokes 1/2ppd. -Encourage tobacco cessation   Diet: Carb modified   DVT Ppx: SQ Heparin TID   Code: Full   Dispo: Disposition is deferred at this time, awaiting improvement of current medical problems.  Anticipated discharge in approximately 2 day(s).   The patient does have a current PCP Otho Bellows, MD) and does need an [facility] hospital follow-up appointment after discharge.  The patient does not have transportation limitations that hinder transportation to clinic appointments.  .Services Needed at time of discharge: Y = Yes, Blank = No PT:   OT:   RN:   Equipment:   Other:     LOS: 2 days   Rebecca Eaton, MD 11/30/2013, 9:01 AM

## 2013-11-30 NOTE — Progress Notes (Signed)
PT Cancellation Note  Patient Details Name: Jerry Cantrell MRN: [id number] DOB: [birth year]   Cancelled Treatment:    Reason Eval/Treat Not Completed: Medical issues which prohibited therapy (pt with current BP 196/105)   Lanetta Inch Beth 11/30/2013, 8:10 AM Elwyn Reach, [place]

## 2013-11-30 NOTE — Progress Notes (Signed)
Report given to receiving RN. Patient in bed watching TV. No signs or symptoms of distress or discomfort. No verbal complaints.

## 2013-12-01 ENCOUNTER — Inpatient Hospital Stay (HOSPITAL_COMMUNITY): Payer: BC Managed Care – PPO

## 2013-12-01 LAB — GLUCOSE, CAPILLARY
GLUCOSE-CAPILLARY: 254 mg/dL — AB (ref 70–99)
Glucose-Capillary: 193 mg/dL — ABNORMAL HIGH (ref 70–99)
Glucose-Capillary: 179 mg/dL — ABNORMAL HIGH (ref 70–99)
Glucose-Capillary: 166 mg/dL — ABNORMAL HIGH (ref 70–99)

## 2013-12-01 MED ORDER — INSULIN GLARGINE 100 UNIT/ML ~~LOC~~ SOLN
23.0000 [IU] | Freq: Two times a day (BID) | SUBCUTANEOUS | Status: DC
  Administered 2013-12-01 – 2013-12-04 (×7): 23 [IU] via SUBCUTANEOUS
  Filled 2013-12-01 (×9): qty 0.23

## 2013-12-01 MED ORDER — AMLODIPINE BESYLATE 10 MG PO TABS: 10.0000 mg | ORAL_TABLET | Freq: Every day | ORAL | Status: DC

## 2013-12-01 MED ORDER — INSULIN ASPART 100 UNIT/ML ~~LOC~~ SOLN
6.0000 [IU] | Freq: Three times a day (TID) | SUBCUTANEOUS | Status: DC
  Administered 2013-12-01 – 2013-12-03 (×6): 6 [IU] via SUBCUTANEOUS

## 2013-12-01 MED ORDER — IRBESARTAN 300 MG PO TABS
300.0000 mg | ORAL_TABLET | Freq: Every day | ORAL | Status: DC
  Administered 2013-12-01 – 2013-12-04 (×4): 300 mg via ORAL
  Filled 2013-12-01 (×4): qty 1

## 2013-12-01 MED ORDER — FUROSEMIDE 20 MG PO TABS: 20.0000 mg | ORAL_TABLET | Freq: Every day | ORAL | Status: DC

## 2013-12-01 NOTE — Progress Notes (Signed)
Inpatient Diabetes Program Recommendations  AACE/ADA: New Consensus Statement on Inpatient Glycemic Control (2013)  Target Ranges:  Prepandial:   less than 140 mg/dL      Peak postprandial:   less than 180 mg/dL (1-2 hours)      Critically ill patients:  140 - 180 mg/dL     Results for KARSIN, FRIEDLAND (MRN [id number]) as of 12/01/2013 08:44  Ref. Range 11/30/2013 08:19 11/30/2013 12:28 11/30/2013 15:55 11/30/2013 21:21  Glucose-Capillary Latest Range: 70-99 mg/dL 196 (H) 423 (H) 249 (H) 231 (H)    Results for LYNNE, VALLAS (MRN [id number]) as of 12/01/2013 08:44  Ref. Range 12/01/2013 05:55  Glucose-Capillary Latest Range: 70-99 mg/dL 193 (H)    **Pt still having significant glucose elevations.   MD- Please consider the following in-hospital insulin adjustments:  1. Increase Levemir to 23 units bid 2. Add Novolog meal coverage- Novolog 6 units tid with meals   Will follow. Wyn Quaker RN, MSN, CDE Diabetes Coordinator Inpatient Diabetes Program Team Pager: [phone] (8a-10p)

## 2013-12-01 NOTE — Progress Notes (Signed)
SATURATION QUALIFICATIONS: (This note is used to comply with regulatory documentation for home oxygen)  Patient Saturations on Room Air at Rest = 95%  Patient Saturations on Room Air while Ambulating = 87%  Patient Saturations on 2 Liters of oxygen while Ambulating = 93-99%  Please briefly explain why patient needs home oxygen: desaturation below 90% on RA with activity Elwyn Reach, [place]

## 2013-12-01 NOTE — Progress Notes (Signed)
Subjective: No complaints  Objective: Vital signs in last 24 hours: Temp:  [97.9 F (36.6 C)-98.7 F (37.1 C)] 98.6 F (37 C) (04/03 0514) Pulse Rate:  [66-81] 81 (04/03 0514) Resp:  [11-18] 18 (04/03 0514) BP: (136-196)/(70-105) 155/78 mmHg (04/03 0514) SpO2:  [94 %-99 %] 97 % (04/03 0514) Weight:  [294 lb 5 oz (133.5 kg)-296 lb 3.2 oz (134.355 kg)] 296 lb 3.2 oz (134.355 kg) (04/03 0514) Last BM Date: 11/29/13  Intake/Output from previous day: 04/02 0701 - 04/03 0700 In: 600 [P.O.:600] Out: 2100 [Urine:2100] Intake/Output this shift:    Medications Current Facility-Administered Medications  Medication Dose Route Frequency Provider Last Rate Last Dose  . 0.9 %  sodium chloride infusion   Intravenous Continuous Axel Filler, MD      . albuterol (PROVENTIL) (2.5 MG/3ML) 0.083% nebulizer solution 3 mL  3 mL Inhalation Q4H PRN Otho Bellows, MD      . amLODipine (NORVASC) tablet 10 mg  10 mg Oral Daily Otho Bellows, MD   10 mg at 11/30/13 0925  . aspirin chewable tablet 81 mg  81 mg Oral Daily Otho Bellows, MD   81 mg at 11/30/13 [id number]  . carvedilol (COREG) tablet 25 mg  25 mg Oral BID WC Otho Bellows, MD   25 mg at 11/30/13 1756  . furosemide (LASIX) tablet 20 mg  20 mg Oral Q breakfast Rebecca Eaton, MD   20 mg at 11/30/13 1243  . hydrALAZINE (APRESOLINE) injection 5-10 mg  5-10 mg Intravenous Q6H PRN Axel Filler, MD   10 mg at 11/30/13 0416  . insulin aspart (novoLOG) injection 0-20 Units  0-20 Units Subcutaneous TID WC Otho Bellows, MD   4 Units at 12/01/13 [phone]  . insulin aspart (novoLOG) injection 0-5 Units  0-5 Units Subcutaneous QHS Otho Bellows, MD   2 Units at 11/30/13 2200  . insulin glargine (LANTUS) injection 20 Units  20 Units Subcutaneous BID Rebecca Eaton, MD   20 Units at 11/30/13 2200  . labetalol (NORMODYNE,TRANDATE) 4 mg/mL in dextrose 5 % 125 mL infusion  0.5-3 mg/min Intravenous Titrated Alwyn Pea, MD   0.25 mg/min  at 11/29/13 1350  . morphine 2 MG/ML injection 2 mg  2 mg Intravenous Q3H PRN Ejiroghene Emokpae, MD      . ondansetron (ZOFRAN) injection 4 mg  4 mg Intravenous Q6H PRN Ejiroghene Emokpae, MD      . oxyCODONE-acetaminophen (PERCOCET/ROXICET) 5-325 MG per tablet 1-2 tablet  1-2 tablet Oral Q6H PRN Jenetta Downer, MD   2 tablet at 12/01/13 0536  . sodium chloride 0.9 % injection 3 mL  3 mL Intravenous Q12H Otho Bellows, MD   3 mL at 11/30/13 2200  . traMADol (ULTRAM) tablet 50 mg  50 mg Oral Q12H PRN Rebecca Eaton, MD        PE: General appearance: alert, cooperative and no distress Lungs: clear to auscultation bilaterally Heart: regular rate and rhythm, S1, S2 normal, no murmur, click, rub or gallop Extremities: Trace LEE Pulses: 2+ and symmetric Skin: Warm and dry Neurologic: Grossly normal  Lab Results:   Recent Labs  11/28/13 1050 11/29/13 0354 11/30/13 0415  WBC 8.1 8.4 7.8  HGB 18.5* 16.8 16.5  HCT 53.3* 50.1 49.8  PLT 122* 112* 100*   BMET  Recent Labs  11/29/13 0354 11/29/13 1240 11/30/13 0415  NA 138 138 139  K 3.8 4.3 3.8  CL 98 97 99  CO2  30 29 29   GLUCOSE 331* 353* 133*  BUN 30* 28* 23  CREATININE 1.68* 1.69* 1.40*  CALCIUM 8.3* 8.3* 8.5    Assessment/Plan  Principal Problem:   Hypertensive emergency  BP still running high. 150-170's.  Amlodipine 10, coreg 25, lasix 20 QD.  Recommend adding hydralazine.   Active Problems:   Acute on chronic diastolic heart failure.    Net fluids: -1.5L/-4.0L.  On low dose lasix.  We discussed daily weights and low sodium diet.  He needs to get a scale.     Substance abuse-Cocaine positive on 10/15/13 but negative this admission.  He reports this is from secondary contact with  other person.  Need to be careful with beta blockers.  We discussed it.       Acute renal failure  SCr improving.    Obesity  We discussed lower carb diet.    Smoker        DM Per primary    Diabetic peripheral neuropathy  associated with type 2 diabetes mellitus  Per primary  HAGER, BRYAN PA-C 12/01/2013 7:45 AM  Patient seen and examined. I agree with the assessment and plan as detailed above. See also my additional thoughts below.   I have reviewed the data completely. I agree with the current plan as outlined.  Dola Argyle, MD, [facility] 12/01/2013 11:10 AM

## 2013-12-01 NOTE — Progress Notes (Signed)
RE-check BP is 164/86. Patient is asymptomatic. No signs of distress and no verbal complaints.

## 2013-12-01 NOTE — Progress Notes (Signed)
Report given to receiving RN. Patient in bed resting watching TV. Patient has no verbal complaints and no signs or symptoms of distress or discomfort.

## 2013-12-01 NOTE — Progress Notes (Signed)
Physical Therapy Treatment Patient Details Name: Jerry Cantrell MRN: [id number] DOB: [birth year] Today's Date: 12/01/2013    History of Present Illness Patient is a 48 year old man with history of hypertension, hyperlipidemia, type 2 diabetes mellitus, chronic low back pain with intermittent sciatic pain, who presented with low back pain and right leg pain which caused him to fall , but was found to be severely hypertensive with clinical evidence of heart failure and therefore was admitted for inpatient care.  Patient reports that his low back pain and sciatic pain are chronic and intermittent; he denies any persistent neurologic deficit.  He does report progressive chronic exertional dyspnea aggravated by even minimal exertion, and bilateral lower extremity edema.  He reports that he has difficulty affording his medications and therefore has not been taking them consistently.    PT Comments    Pt progressing with mobility today with continued 8/10 back pain. Pt again educated for back precautions with teach back and handout and able to verbalize end of session. Pt encouraged to continue gait with RW and O2. Will continue to follow to maximize mobility and independence.   Follow Up Recommendations  Outpatient PT     Equipment Recommendations  Rolling walker with 5" wheels    Recommendations for Other Services       Precautions / Restrictions Precautions Precautions: Back;Fall Precaution Comments: precautions to help decrease pain and increase mobility Restrictions Weight Bearing Restrictions: No    Mobility  Bed Mobility Overal bed mobility: Needs Assistance Bed Mobility: Rolling;Sidelying to Sit Rolling: Supervision Sidelying to sit: Supervision       General bed mobility comments: cues for sequence   Transfers Overall transfer level: Needs assistance   Transfers: Sit to/from Stand Sit to Stand: Supervision         General transfer comment: cues for hand placement and  posture  Ambulation/Gait Ambulation/Gait assistance: Min guard Ambulation Distance (Feet): 300 Feet Assistive device: Rolling walker (2 wheeled) Gait Pattern/deviations: Step-through pattern;Decreased stride length   Gait velocity interpretation: <1.8 ft/sec, indicative of risk for recurrent falls     Stairs Stairs: Yes Stairs assistance: Min guard Stair Management: Sideways;Step to pattern;One rail Right Number of Stairs: 5    Wheelchair Mobility    Modified Rankin (Stroke Patients Only)       Balance                                    Cognition Arousal/Alertness: Awake/alert Behavior During Therapy: WFL for tasks assessed/performed Overall Cognitive Status: Within Functional Limits for tasks assessed                      Exercises      General Comments        Pertinent Vitals/Pain sats 95% on RA at rest with drop to 87% walking to the door On 2L 93-99% with gait HR 71 8/10 back pain    Home Living                      Prior Function            PT Goals (current goals can now be found in the care plan section) Progress towards PT goals: Progressing toward goals    Frequency       PT Plan Current plan remains appropriate    Co-evaluation  End of Session Equipment Utilized During Treatment: Gait belt;Oxygen Activity Tolerance: Patient limited by pain Patient left: in chair;with call bell/phone within reach     Time: 0903-0939 PT Time Calculation (min): 36 min  Charges:  $Gait Training: 8-22 mins $Therapeutic Activity: 8-22 mins                    G Codes:      Melford Aase 2013-12-30, 10:46 AM Elwyn Reach, [place]

## 2013-12-01 NOTE — Progress Notes (Signed)
MD made aware of patient's BP being 187/102. Patient is asymptomatic at the time. No verbal complaints and no signs or symptoms of distress. Md stated to wait another 15 minutes to re-check BP before proceeding with PRN hydralazine. If must must administer hydralazine start with 5mg  first. Will continue to monitor patient for further changes in condition.

## 2013-12-01 NOTE — Progress Notes (Addendum)
Subjective: Patient reports breathing is back to baseline. He denies CP, abd pain, N/V, palpitations. He was working with PT when I saw him and per PT he desaturated to 87% on room air with exertion, but was satting well on room air at rest. Back pain remains an issue, but no new symptoms.  Objective: Vital signs in last 24 hours: Filed Vitals:   11/30/13 1756 11/30/13 1759 11/30/13 2015 12/01/13 0514  BP: 172/75 177/82 149/70 155/78  Pulse: 68 74 72 81  Temp:   98 F (36.7 C) 98.6 F (37 C)  TempSrc:   Oral Oral  Resp:   18 18  Height:      Weight:    134.355 kg (296 lb 3.2 oz)  SpO2:   98% 97%   Weight change: -0.2 kg (-7.1 oz)  Intake/Output Summary (Last 24 hours) at 12/01/13 0736 Last data filed at 12/01/13 0654  Gross per 24 hour  Intake    600 ml  Output   2100 ml  Net  -1500 ml   Physical Exam General: alert, cooperative, and in no apparent distress HEENT: vision grossly intact Neck: supple Lungs: lungs CTAB, normal work of respiration, no wheezes, rhonchi or rales Heart: regular rate and rhythm, no murmurs, gallops, or rubs Abdomen: soft, non-tender, non-distended, normal bowel sounds Extremities: trace pitting edema b/l, warm extremities  Neurologic: alert & oriented X3, cranial nerves II-XII grossly intact, strength grossly intact, sensation intact to light touch  Lab Results: Basic Metabolic Panel:  Recent Labs Lab 11/29/13 0017  11/29/13 1240 11/30/13 0415  NA 141  < > 138 139  K 4.3  < > 4.3 3.8  CL 99  < > 97 99  CO2 30  < > 29 29  GLUCOSE 382*  < > 353* 133*  BUN 30*  < > 28* 23  CREATININE 1.85*  < > 1.69* 1.40*  CALCIUM 8.1*  < > 8.3* 8.5  MG 1.7  --   --   --   < > = values in this interval not displayed. CBC:  Recent Labs Lab 11/29/13 0354 11/30/13 0415  WBC 8.4 7.8  HGB 16.8 16.5  HCT 50.1 49.8  MCV 90.3 92.6  PLT 112* 100*   Cardiac Enzymes:  Recent Labs Lab 11/29/13 0017 11/29/13 0354 11/29/13 1240  TROPONINI <0.30  <0.30 <0.30   BNP:  Recent Labs Lab 11/28/13 1050  PROBNP 4116.0*   CBG:  Recent Labs Lab 11/29/13 2137 11/30/13 0819 11/30/13 1228 11/30/13 1555 11/30/13 2121 12/01/13 0555  GLUCAP 140* 196* 423* 249* 231* 193*   Urine Drug Screen: Drugs of Abuse     Component Value Date/Time   LABOPIA NONE DETECTED 11/28/2013 1843   LABOPIA NEG 06/02/2012 0945   COCAINSCRNUR NONE DETECTED 11/28/2013 1843   COCAINSCRNUR NEG 06/02/2012 0945   LABBENZ NONE DETECTED 11/28/2013 1843   LABBENZ NEG 06/02/2012 0945   LABBENZ NEG 02/18/2010 2054   AMPHETMU NONE DETECTED 11/28/2013 1843   AMPHETMU NEG 02/18/2010 2054   THCU NONE DETECTED 11/28/2013 1843   LABBARB NONE DETECTED 11/28/2013 1843   LABBARB NEG 06/02/2012 0945   Urinalysis:  Recent Labs Lab 11/28/13 1844  COLORURINE YELLOW  LABSPEC 1.010  PHURINE 6.0  GLUCOSEU NEGATIVE  HGBUR MODERATE*  BILIRUBINUR NEGATIVE  KETONESUR NEGATIVE  PROTEINUR 100*  UROBILINOGEN 0.2  NITRITE NEGATIVE  LEUKOCYTESUR NEGATIVE    Micro Results: Recent Results (from the past 240 hour(s))  MRSA PCR SCREENING     Status:  None   Collection Time    11/28/13 10:21 PM      Result Value Ref Range Status   MRSA by PCR NEGATIVE  NEGATIVE Final   Comment:            The GeneXpert MRSA Assay (FDA     approved for NASAL specimens     only), is one component of a     comprehensive MRSA colonization     surveillance program. It is not     intended to diagnose MRSA     infection nor to guide or     monitor treatment for     MRSA infections.   Studies/Results: Mr Lumbar Spine Wo Contrast  11/29/2013   CLINICAL DATA:  Low back and right leg pain. Right lower extremity radiculopathy.  EXAM: MRI LUMBAR SPINE WITHOUT CONTRAST  TECHNIQUE: Multiplanar, multisequence MR imaging was performed. No intravenous contrast was administered.  COMPARISON:  MRI dated 01/28/2012  FINDINGS: Normal conus at T12-L1.  T12-L1 through L3-4:  Normal.  L4-5: Progressive facet arthritis  with new joint effusions. Right far lateral disc bulge is immediately adjacent to the exiting right L4 nerve and knee irritated. The bulge actually appears less prominent than on the prior study.  L5-S1: Left pars defect. Mild right facet arthritis, unchanged. The disc is normal. 1.5 mm spondylolisthesis.  IMPRESSION: 1. Progressive inflammation of the facet joints at L4-5 with progressive joint effusions. 2. Right far lateral disc bulge, reduced in size. 3. No other change.  No focal neural impingement.   Electronically Signed   By: Rozetta Nunnery M.D.   On: 11/29/2013 17:30   Dg Lumb Spine Flex&ext Only  11/30/2013   CLINICAL DATA:  48 year old male with severe low back pain radiating to the right lower extremity. Initial encounter.  EXAM: LUMBAR SPINE FLEX AND EXTEND ONLY - 2-3 VIEW  COMPARISON:  Lumbar MRI 11/29/2013.  Lumbar radiographs 01/14/2012.  FINDINGS: Same numbering system as on 11/29/2013. Lumbar segmentation is normal. Bulky anterior T11-T12 endplate osteophytes. Grossly intact visible lower thoracic levels.  Lateral views in neutral flexion and extension. No abnormal motion identified.  IMPRESSION: No abnormal motion in the lumbar spine with flexion extension. No acute osseous abnormality identified.   Electronically Signed   By: Lars Pinks M.D.   On: 11/30/2013 11:24   Medications: I have reviewed the patient's current medications. Scheduled Meds: . amLODipine  10 mg Oral Daily  . aspirin  81 mg Oral Daily  . carvedilol  25 mg Oral BID WC  . furosemide  20 mg Oral Q breakfast  . insulin aspart  0-20 Units Subcutaneous TID WC  . insulin aspart  0-5 Units Subcutaneous QHS  . insulin glargine  20 Units Subcutaneous BID  . sodium chloride  3 mL Intravenous Q12H   Continuous Infusions: . sodium chloride Stopped (11/29/13 1700)  . labetalol (NORMODYNE) infusion Stopped (11/29/13 1700)   PRN Meds:.albuterol, hydrALAZINE, morphine injection, ondansetron (ZOFRAN) IV, oxyCODONE-acetaminophen,  traMADol  Assessment/Plan: Accelerated Hypertension with volume overload 2/2 acute on chronic diastolic CHF- Blood pressure ranged 136-177/70-92 overnight. Patient is now on lasix 20mg  PO daily, amlodipine 10mg  daily, and coreg 25mg  BID. I spoke with Dr. Ron Parker this morning and he agrees with plan to add back patient's benicar 40mg  daily (will start patient on irbesartan 300mg  daily as an equivalent dose per formulary). I/Os net negative 4.0L during the admission so far. Patient remains hypoxic to 87% on room air w/ ambulation, but is satting well on  room air at rest. He may require at least short term home oxygen. Patient notes that his shortness of breath is back to his baseline and that he has been having worsening DOE over the past few months. Perhaps a component of COPD contributing.  -add irbesartan  -cardiology consult, appreciate recs -consider outpatient PFTs given smoking hx  -f/u with [facility] scheduled for April 10 at 1:15pm  Mechanical fall in setting of degenerative changes on MRI:  Patient with continued pain. Will follow up with orthopaedics, Dr. Sharol Given next week.  -PT recommends outpatient PT (written rx given) and 5 wheel rolling walker (ordered) -appointment with Dr. Sharol Given scheduled for April 6 at 1:15pm  Insulin-dependent Type II Diabetes Mellitus -  Pt at home on Lantus 25 U BID and Novolog 4-8 U TID before meals. A1c 8.9% as of 11/29/13.  -increase lantus to 23U BID -per CM, lantus and 70/30 would be same price per month ($25) with coupon card, which was given to patient; Novolog will also be $25/month -add novolog 6U TID WC per diabetes coordinator  -Insulin sliding scale with meals and at bedtime  -Carb modified diet   Thrombocytopenia - Relatively stable, but PLT remain low. Currently with no active bleeding.  -Monitor for bleeding  -will need repeat CBC as outpatient  Hyperlipidemia - Last lipid panel on 05/11/13 with hypercholesteremia (219), hypertriglyceridemia (400), low HDL  (39), and high LDL (100).  -Consider initiating statin therapy (LFT normal on 10/25/13) - will not start as inpatient given patient's hx of noncompliance and we are already making numerous medication changes  Substance Abuse - UDS negative on admission. Pt with cocaine positive UDS on 10/25/13. Patient currently smokes 1/2ppd. -Encourage tobacco cessation   Diet: Carb modified   DVT Ppx: SQ Heparin TID   Code: Full   Dispo: Disposition is deferred at this time, awaiting improvement of current medical problems.  Anticipated discharge in approximately 1 day(s).   The patient does have a current PCP Otho Bellows, MD) and does need an [facility] hospital follow-up appointment after discharge.  The patient does not have transportation limitations that hinder transportation to clinic appointments.  .Services Needed at time of discharge: Y = Yes, Blank = No PT:   OT:   RN:   Equipment:   Other:     LOS: 3 days   Rebecca Eaton, MD 12/01/2013, 7:36 AM

## 2013-12-02 LAB — BASIC METABOLIC PANEL
BUN: 21 mg/dL (ref 6–23)
CALCIUM: 8.6 mg/dL (ref 8.4–10.5)
CO2: 31 meq/L (ref 19–32)
Chloride: 97 mEq/L (ref 96–112)
Creatinine, Ser: 1.27 mg/dL (ref 0.50–1.35)
GFR calc Af Amer: 76 mL/min — ABNORMAL LOW (ref >90)
GFR calc non Af Amer: 65 mL/min — ABNORMAL LOW (ref >90)
GLUCOSE: 214 mg/dL — AB (ref 70–99)
POTASSIUM: 4.3 meq/L (ref 3.7–5.3)
Sodium: 138 mEq/L (ref 137–147)

## 2013-12-02 LAB — GLUCOSE, CAPILLARY
GLUCOSE-CAPILLARY: 197 mg/dL — AB (ref 70–99)
GLUCOSE-CAPILLARY: 183 mg/dL — AB (ref 70–99)
GLUCOSE-CAPILLARY: 213 mg/dL — AB (ref 70–99)
Glucose-Capillary: 364 mg/dL — ABNORMAL HIGH (ref 70–99)

## 2013-12-02 MED ORDER — DIPHENHYDRAMINE HCL 25 MG PO CAPS: 25.0000 mg | ORAL_CAPSULE | Freq: Four times a day (QID) | ORAL | Status: DC | PRN

## 2013-12-02 MED ORDER — FUROSEMIDE 40 MG PO TABS
40.0000 mg | ORAL_TABLET | Freq: Every day | ORAL | Status: DC
  Administered 2013-12-02 – 2013-12-04 (×3): 40 mg via ORAL
  Filled 2013-12-02 (×4): qty 1

## 2013-12-02 MED ORDER — HYDRALAZINE HCL 10 MG PO TABS
10.0000 mg | ORAL_TABLET | Freq: Three times a day (TID) | ORAL | Status: DC
  Filled 2013-12-02 (×4): qty 1

## 2013-12-02 MED ORDER — HYDRALAZINE HCL 25 MG PO TABS
25.0000 mg | ORAL_TABLET | Freq: Three times a day (TID) | ORAL | Status: DC
  Administered 2013-12-02 – 2013-12-03 (×3): 25 mg via ORAL
  Filled 2013-12-02 (×6): qty 1

## 2013-12-02 NOTE — Progress Notes (Signed)
BP rechecked. After giving hydralazine 5mg . 196/96. No c/o headache, blurring of vision nor chest pains.

## 2013-12-02 NOTE — Progress Notes (Signed)
BP 196/94. Aymptomatic. Hydralazine 5 mg iv given

## 2013-12-02 NOTE — Progress Notes (Signed)
Patient ID: Blaine Mccalla, male   DOB: 09-17-1965, 48 y.o.   MRN: [id number]     Primary cardiologist:  Subjective:      Objective:   Temp:  [97.9 F (36.6 C)-98.2 F (36.8 C)] 98.2 F (36.8 C) (04/04 0627) Pulse Rate:  [61-68] 63 (04/04 0700) Resp:  [18-20] 18 (04/04 0627) BP: (160-196)/(85-102) 176/96 mmHg (04/04 0700) SpO2:  [94 %-97 %] 97 % (04/04 0627) Weight:  [295 lb 14.4 oz (134.219 kg)] 295 lb 14.4 oz (134.219 kg) (04/04 0627) Last BM Date: 11/29/13  Filed Weights   11/30/13 1440 12/01/13 0514 12/02/13 0627  Weight: 294 lb 5 oz (133.5 kg) 296 lb 3.2 oz (134.355 kg) 295 lb 14.4 oz (134.219 kg)    Intake/Output Summary (Last 24 hours) at 12/02/13 1010 Last data filed at 12/01/13 2300  Gross per 24 hour  Intake    720 ml  Output   1550 ml  Net   -830 ml    Telemetry: NSR  Exam:  General: NAD  Resp:CTAB  Cardiac: RRR, no m/r/g, no JVD  GI: soft, NT, ND  MSK:LEs warm, no edema  Neuro: no focal deficits   Lab Results:  Basic Metabolic Panel:  Recent Labs Lab 11/29/13 0017  11/29/13 1240 11/30/13 0415 12/02/13 0319  NA 141  < > 138 139 138  K 4.3  < > 4.3 3.8 4.3  CL 99  < > 97 99 97  CO2 30  < > 29 29 31   GLUCOSE 382*  < > 353* 133* 214*  BUN 30*  < > 28* 23 21  CREATININE 1.85*  < > 1.69* 1.40* 1.27  CALCIUM 8.1*  < > 8.3* 8.5 8.6  MG 1.7  --   --   --   --   < > = values in this interval not displayed.  Liver Function Tests: No results found for this basename: AST, ALT, ALKPHOS, BILITOT, PROT, ALBUMIN,  in the last 168 hours  CBC:  Recent Labs Lab 11/28/13 1050 11/29/13 0354 11/30/13 0415  WBC 8.1 8.4 7.8  HGB 18.5* 16.8 16.5  HCT 53.3* 50.1 49.8  MCV 90.6 90.3 92.6  PLT 122* 112* 100*    Cardiac Enzymes:  Recent Labs Lab 11/29/13 0017 11/29/13 0354 11/29/13 1240  TROPONINI <0.30 <0.30 <0.30    BNP:  Recent Labs  01/28/13 2042 10/25/13 1016 11/28/13 1050  PROBNP 453.4* 270.0* 4116.0*    Coagulation: No  results found for this basename: INR,  in the last 168 hours  ECG:   Medications:   Scheduled Medications: . amLODipine  10 mg Oral Daily  . aspirin  81 mg Oral Daily  . carvedilol  25 mg Oral BID WC  . furosemide  40 mg Oral Q breakfast  . hydrALAZINE  10 mg Oral 3 times per day  . insulin aspart  0-20 Units Subcutaneous TID WC  . insulin aspart  0-5 Units Subcutaneous QHS  . insulin aspart  6 Units Subcutaneous TID WC  . insulin glargine  23 Units Subcutaneous BID  . irbesartan  300 mg Oral Daily  . sodium chloride  3 mL Intravenous Q12H     Infusions: . sodium chloride Stopped (11/29/13 1700)     PRN Medications:  albuterol, diphenhydrAMINE, hydrALAZINE, morphine injection, ondansetron (ZOFRAN) IV, traMADol     Assessment/Plan    48 yo male admitted with hypertensive emergency and acute on chronic diastolic heart failure.   1. Acute on chronic diastolic heart failure -  echo 11/29/13 LVEF 99991111, grade I diastolic dysfunction.  - negative 643mL yesterday, net negative 4.6 liters since admission. Cr is improving with diuresis. Currently on lasix oral qday. Volume status appears improved by exam and xray.  - ambulate today and follow symptoms  2. HTN - blood pressure remains elevated, initiated on oral hydralazine this morning. Will change to 25mg  tid, titrate further as needed      Carlyle Dolly, M.D., F.A.C.C.

## 2013-12-02 NOTE — Progress Notes (Signed)
Subjective: Patient feels well this morning. No HA, vision changes, CP, SOB. He received the prn hydralazine once this morning for elevated blood pressure. I had a long conversation regarding the need for patient to fill his antihypertensive prescriptions once he is discharged. We discussed that he will plan to stop smoking and use this money to pay for his medications. I explained that the long standing uncontrolled DM and HTN have caused damage to his heart and kidneys and if he does not obtain better control, then the damage will continue to progress. He understands.  Objective: Vital signs in last 24 hours: Filed Vitals:   12/01/13 1904 12/01/13 2130 12/02/13 0627 12/02/13 0700  BP: 164/86 173/85 196/94 176/96  Pulse: 65 61 68 63  Temp:  97.9 F (36.6 C) 98.2 F (36.8 C)   TempSrc:  Oral Oral   Resp:  19 18   Height:      Weight:   134.219 kg (295 lb 14.4 oz)   SpO2:  95% 97%    Weight change: 0.719 kg (1 lb 9.4 oz)  Intake/Output Summary (Last 24 hours) at 12/02/13 0825 Last data filed at 12/01/13 2300  Gross per 24 hour  Intake    960 ml  Output   1550 ml  Net   -590 ml   Physical Exam General: alert, cooperative, and in no apparent distress HEENT: vision grossly intact Neck: supple Lungs: lungs CTAB, normal work of respiration, no wheezes, rhonchi or rales Heart: regular rate and rhythm, no murmurs, gallops, or rubs Abdomen: soft, non-tender, non-distended, normal bowel sounds Extremities: trace to 1+ pitting edema b/l, warm extremities  Neurologic: alert & oriented X3, cranial nerves II-XII grossly intact, strength grossly intact, sensation intact to light touch  Lab Results: Basic Metabolic Panel:  Recent Labs Lab 11/29/13 0017  11/30/13 0415 12/02/13 0319  NA 141  < > 139 138  K 4.3  < > 3.8 4.3  CL 99  < > 99 97  CO2 30  < > 29 31  GLUCOSE 382*  < > 133* 214*  BUN 30*  < > 23 21  CREATININE 1.85*  < > 1.40* 1.27  CALCIUM 8.1*  < > 8.5 8.6  MG 1.7   --   --   --   < > = values in this interval not displayed. CBC:  Recent Labs Lab 11/29/13 0354 11/30/13 0415  WBC 8.4 7.8  HGB 16.8 16.5  HCT 50.1 49.8  MCV 90.3 92.6  PLT 112* 100*   Cardiac Enzymes:  Recent Labs Lab 11/29/13 0017 11/29/13 0354 11/29/13 1240  TROPONINI <0.30 <0.30 <0.30   BNP:  Recent Labs Lab 11/28/13 1050  PROBNP 4116.0*   CBG:  Recent Labs Lab 11/30/13 2121 12/01/13 0555 12/01/13 1140 12/01/13 1628 12/01/13 2134 12/02/13 0618  GLUCAP 231* 193* 254* 179* 166* 197*   Urine Drug Screen: Drugs of Abuse     Component Value Date/Time   LABOPIA NONE DETECTED 11/28/2013 1843   LABOPIA NEG 06/02/2012 0945   COCAINSCRNUR NONE DETECTED 11/28/2013 1843   COCAINSCRNUR NEG 06/02/2012 0945   LABBENZ NONE DETECTED 11/28/2013 1843   LABBENZ NEG 06/02/2012 0945   LABBENZ NEG 02/18/2010 2054   AMPHETMU NONE DETECTED 11/28/2013 1843   AMPHETMU NEG 02/18/2010 2054   THCU NONE DETECTED 11/28/2013 1843   LABBARB NONE DETECTED 11/28/2013 1843   LABBARB NEG 06/02/2012 0945   Urinalysis:  Recent Labs Lab 11/28/13 1844  COLORURINE YELLOW  LABSPEC 1.010  PHURINE 6.0  GLUCOSEU NEGATIVE  HGBUR MODERATE*  BILIRUBINUR NEGATIVE  KETONESUR NEGATIVE  PROTEINUR 100*  UROBILINOGEN 0.2  NITRITE NEGATIVE  LEUKOCYTESUR NEGATIVE    Micro Results: Recent Results (from the past 240 hour(s))  MRSA PCR SCREENING     Status: None   Collection Time    11/28/13 10:21 PM      Result Value Ref Range Status   MRSA by PCR NEGATIVE  NEGATIVE Final   Comment:            The GeneXpert MRSA Assay (FDA     approved for NASAL specimens     only), is one component of a     comprehensive MRSA colonization     surveillance program. It is not     intended to diagnose MRSA     infection nor to guide or     monitor treatment for     MRSA infections.   Studies/Results: Dg Chest 2 View  12/01/2013   CLINICAL DATA:  Short of breath.  Chest pain.  EXAM: CHEST  2 VIEW   COMPARISON:  11/28/2013  FINDINGS: Mild enlargement of cardiac silhouette. Normal mediastinal and hilar contours. Vascular prominence and mild thickening of the fissures is stable without overt edema. No lung consolidation.  Pleural effusions noted on the prior study have resolve. No infiltrate.  IMPRESSION: Resolved CHF. Resolved small effusions. Persistent mild cardiomegaly and mild central vascular congestion.   Electronically Signed   By: Lajean Manes M.D.   On: 12/01/2013 12:54   Dg Lumb Spine Flex&ext Only  11/30/2013   CLINICAL DATA:  48 year old male with severe low back pain radiating to the right lower extremity. Initial encounter.  EXAM: LUMBAR SPINE FLEX AND EXTEND ONLY - 2-3 VIEW  COMPARISON:  Lumbar MRI 11/29/2013.  Lumbar radiographs 01/14/2012.  FINDINGS: Same numbering system as on 11/29/2013. Lumbar segmentation is normal. Bulky anterior T11-T12 endplate osteophytes. Grossly intact visible lower thoracic levels.  Lateral views in neutral flexion and extension. No abnormal motion identified.  IMPRESSION: No abnormal motion in the lumbar spine with flexion extension. No acute osseous abnormality identified.   Electronically Signed   By: Lars Pinks M.D.   On: 11/30/2013 11:24   Medications: I have reviewed the patient's current medications. Scheduled Meds: . amLODipine  10 mg Oral Daily  . aspirin  81 mg Oral Daily  . carvedilol  25 mg Oral BID WC  . furosemide  40 mg Oral Q breakfast  . hydrALAZINE  10 mg Oral 3 times per day  . insulin aspart  0-20 Units Subcutaneous TID WC  . insulin aspart  0-5 Units Subcutaneous QHS  . insulin aspart  6 Units Subcutaneous TID WC  . insulin glargine  23 Units Subcutaneous BID  . irbesartan  300 mg Oral Daily  . sodium chloride  3 mL Intravenous Q12H   Continuous Infusions: . sodium chloride Stopped (11/29/13 1700)   PRN Meds:.albuterol, diphenhydrAMINE, hydrALAZINE, morphine injection, ondansetron (ZOFRAN) IV,  traMADol  Assessment/Plan: Accelerated Hypertension with volume overload 2/2 acute on chronic diastolic CHF- Breathing improved. Blood pressure remains uncontrolled. Patient hypoxic yesterday so CXR was performed, which showed resolution of interstitial edema. I suspect patient has an element of COPD contributing. Patient's Cr continues to trend down. -increase lasix to 40mg  PO daily -add hydralazine 10mg  TID -continue coreg, irbesartan and amlodipine -discussed with patient the importance of medication compliance as per above; he is in good understanding and made the plan to stop smoking and  use that money to pay for his medications (I calculated his monthly medication cost for treatment of DM and HTN which should be approx $72, though this is without expense of Benicar which I need to verify with CM) -recheck O2 saturation w/ ambulation -cardiology consult, appreciate recs   Mechanical fall in setting of degenerative changes on MRI:  Patient with continued pain. Will follow up with orthopaedics, Dr. Sharol Given next week.  -PT recommends outpatient PT (written rx given) and 5 wheel rolling walker (ordered) -appointment with Dr. Sharol Given scheduled for April 6 at 1:15pm -tramadol prn  Insulin-dependent Type II Diabetes Mellitus -  Better control of his CBGs, 166-197 overnight after increasing lantus and adding novolog 6U TID WC. -continue lantus to 23U BID -continue novolog 6U TID WC -Insulin sliding scale with meals and at bedtime  -Carb modified diet  -I have free sample of lantus and novolog from our clinic that I will give to patient.  Thrombocytopenia - Relatively stable, but PLT remain low. Currently with no active bleeding.  -Monitor for bleeding   Hyperlipidemia - Last lipid panel on 05/11/13 with hypercholesteremia (219), hypertriglyceridemia (400), low HDL (39), and high LDL (100).  -Consider initiating statin therapy (LFT normal on 10/25/13) - will not start as inpatient given patient's hx  of noncompliance and we are already making numerous medication changes  Substance Abuse - UDS negative on admission. Pt with cocaine positive UDS on 10/25/13. Patient currently smokes 1/2ppd. -Encourage tobacco cessation- patient agrees to stop smoking to use this money for his medications  Diet: Carb modified   DVT Ppx: SQ Heparin TID   Code: Full   Dispo: Disposition is deferred at this time, awaiting improvement of current medical problems.  Anticipated discharge in approximately 1 day(s).   The patient does have a current PCP Otho Bellows, MD) and does need an [facility] hospital follow-up appointment after discharge.  The patient does not have transportation limitations that hinder transportation to clinic appointments.  .Services Needed at time of discharge: Y = Yes, Blank = No PT:   OT:   RN:   Equipment:   Other:     LOS: 4 days   Rebecca Eaton, MD 12/02/2013, 8:25 AM

## 2013-12-03 DIAGNOSIS — I441 Atrioventricular block, second degree: Secondary | ICD-10-CM

## 2013-12-03 LAB — GLUCOSE, CAPILLARY
Glucose-Capillary: 124 mg/dL — ABNORMAL HIGH (ref 70–99)
Glucose-Capillary: 137 mg/dL — ABNORMAL HIGH (ref 70–99)
Glucose-Capillary: 80 mg/dL (ref 70–99)
Glucose-Capillary: 131 mg/dL — ABNORMAL HIGH (ref 70–99)

## 2013-12-03 MED ORDER — OXYCODONE-ACETAMINOPHEN 5-325 MG PO TABS
2.0000 | ORAL_TABLET | Freq: Once | ORAL | Status: AC
  Administered 2013-12-03: 2 via ORAL
  Filled 2013-12-03: qty 2

## 2013-12-03 MED ORDER — HYDRALAZINE HCL 50 MG PO TABS
50.0000 mg | ORAL_TABLET | Freq: Once | ORAL | Status: AC
  Administered 2013-12-03: 50 mg via ORAL
  Filled 2013-12-03: qty 1

## 2013-12-03 MED ORDER — HYDRALAZINE HCL 50 MG PO TABS
75.0000 mg | ORAL_TABLET | Freq: Three times a day (TID) | ORAL | Status: DC
  Administered 2013-12-03 – 2013-12-04 (×3): 75 mg via ORAL
  Filled 2013-12-03 (×6): qty 1

## 2013-12-03 NOTE — Progress Notes (Signed)
Patient threatening to leave AMA "if IV doesn't get fixed".  Redressed IV site, flushing successfully.  MD at bedside this evening to discuss plan of care.  Patient satisfied with plan of care.  Elevated blood pressures continue, vital signs otherwise stable; MD aware.  Will continue to monitor.

## 2013-12-03 NOTE — Progress Notes (Signed)
Patient had episode of missed beats. Asymptomatic. Strip placed on chart. Dr. Percival Spanish notified.

## 2013-12-03 NOTE — Progress Notes (Signed)
Patient ID: Oron Poppen, male   DOB: Oct 05, 1965, 48 y.o.   MRN: [id number]      Subjective:    SOB improving  Objective:   Temp:  [97.5 F (36.4 C)-98.3 F (36.8 C)] 97.9 F (36.6 C) (04/05 0603) Pulse Rate:  [60-80] 80 (04/05 0857) Resp:  [18] 18 (04/05 0603) BP: (161-184)/(75-96) 184/96 mmHg (04/05 0857) SpO2:  [92 %-97 %] 92 % (04/05 0603) Weight:  [293 lb 4.8 oz (133.04 kg)] 293 lb 4.8 oz (133.04 kg) (04/05 0603) Last BM Date: 12/02/13  Filed Weights   12/01/13 0514 12/02/13 0627 12/03/13 0603  Weight: 296 lb 3.2 oz (134.355 kg) 295 lb 14.4 oz (134.219 kg) 293 lb 4.8 oz (133.04 kg)    Intake/Output Summary (Last 24 hours) at 12/03/13 0930 Last data filed at 12/03/13 0603  Gross per 24 hour  Intake    240 ml  Output   2250 ml  Net  -2010 ml    Telemetry: NSR, Wenkebach and 2:1 AV block  Exam:  General:NAD  Resp: CTAB  Cardiac: RRR, no m/r/g, no JVD, no carotid bruits  [id number] soft, NT, ND  MSK: trace bilateral LE edema  Neuro: no focal deficits   Lab Results:  Basic Metabolic Panel:  Recent Labs Lab 11/29/13 0017  11/29/13 1240 11/30/13 0415 12/02/13 0319  NA 141  < > 138 139 138  K 4.3  < > 4.3 3.8 4.3  CL 99  < > 97 99 97  CO2 30  < > 29 29 31   GLUCOSE 382*  < > 353* 133* 214*  BUN 30*  < > 28* 23 21  CREATININE 1.85*  < > 1.69* 1.40* 1.27  CALCIUM 8.1*  < > 8.3* 8.5 8.6  MG 1.7  --   --   --   --   < > = values in this interval not displayed.  Liver Function Tests: No results found for this basename: AST, ALT, ALKPHOS, BILITOT, PROT, ALBUMIN,  in the last 168 hours  CBC:  Recent Labs Lab 11/28/13 1050 11/29/13 0354 11/30/13 0415  WBC 8.1 8.4 7.8  HGB 18.5* 16.8 16.5  HCT 53.3* 50.1 49.8  MCV 90.6 90.3 92.6  PLT 122* 112* 100*    Cardiac Enzymes:  Recent Labs Lab 11/29/13 0017 11/29/13 0354 11/29/13 1240  TROPONINI <0.30 <0.30 <0.30    BNP:  Recent Labs  01/28/13 2042 10/25/13 1016 11/28/13 1050  PROBNP  453.4* 270.0* 4116.0*    Coagulation: No results found for this basename: INR,  in the last 168 hours  ECG:   Medications:   Scheduled Medications: . amLODipine  10 mg Oral Daily  . aspirin  81 mg Oral Daily  . carvedilol  25 mg Oral BID WC  . furosemide  40 mg Oral Q breakfast  . hydrALAZINE  25 mg Oral 3 times per day  . insulin aspart  0-20 Units Subcutaneous TID WC  . insulin aspart  0-5 Units Subcutaneous QHS  . insulin aspart  6 Units Subcutaneous TID WC  . insulin glargine  23 Units Subcutaneous BID  . irbesartan  300 mg Oral Daily  . sodium chloride  3 mL Intravenous Q12H     Infusions: . sodium chloride Stopped (11/29/13 1700)     PRN Medications:  albuterol, diphenhydrAMINE, hydrALAZINE, morphine injection, ondansetron (ZOFRAN) IV, traMADol     Assessment/Plan    48 yo male admitted with hypertensive emergency and acute on chronic diastolic heart failure.  1. Acute on chronic diastolic heart failure  - echo 11/29/13 LVEF [id number], grade I diastolic dysfunction.  - negative 1.5 L yesterday, net negative 6.2  liters since admission. Cr  has been improving with diuresis, do not have labs for today Currently on lasix oral qday. Volume status appears improved by exam and xray.  - ambulate today and follow symptoms  - at time of discharge will need cardio follow up at 2 weeks with Dr Scarlette Calico.  2. HTN  - started on hydralazine 25mg  tid yesterday - bp remains elevated, will give additional 50mg  now and increase to 75 mg tid today - reports significant back pain which could be causing some elevation of bp  3. Second degree heart block - tele strips early AM while sleeping show Wenkebach and 2:1 block. Continue beta blocker at current dose.  - patient denies any symptoms of dizziness - fairly benign finding while sleeping, no further workup at this time.        Carlyle Dolly, M.D., F.A.C.C.

## 2013-12-03 NOTE — Progress Notes (Signed)
SATURATION QUALIFICATIONS: (This note is used to comply with regulatory documentation for home oxygen)  Patient Saturations on Room Air at Rest = 98%  Patient Saturations on Room Air while Ambulating = 94%  Patient's oxygen saturation dropped to 94% on room air with ambulation of 50ft.

## 2013-12-03 NOTE — Progress Notes (Signed)
Patient rested quietly most of night. Complained of back pain but refused Morphine.

## 2013-12-03 NOTE — Progress Notes (Signed)
Subjective: Patient's only complaint is significant back pain. He does have some continued DOE. Otherwise he is doing well and denies CP, SOB at rest, BLE swelling, palpitations, vision changes, HA.   Objective: Vital signs in last 24 hours: Filed Vitals:   12/02/13 2127 12/03/13 0603 12/03/13 0857 12/03/13 1428  BP: 175/93 165/75 184/96 186/108  Pulse: 69 69 80 67  Temp: 98.3 F (36.8 C) 97.9 F (36.6 C)    TempSrc: Oral Oral    Resp: 18 18    Height:      Weight:  133.04 kg (293 lb 4.8 oz)    SpO2: 96% 92%     Weight change: -1.179 kg (-2 lb 9.6 oz)  Net is negative 6L since he was admitted  Intake/Output Summary (Last 24 hours) at 12/03/13 1449 Last data filed at 12/03/13 1248  Gross per 24 hour  Intake    243 ml  Output   1950 ml  Net  -1707 ml   Per nurse, patient did not desat with ambulation today and maintained SpO2 around 94% on room air.  Physical Exam General: alert, cooperative, and in no apparent distress HEENT: vision grossly intact Neck: supple Lungs: lungs CTAB, normal work of respiration, no wheezes, rhonchi or rales Heart: regular rate and rhythm, no murmurs, gallops, or rubs Abdomen: soft, non-tender, non-distended, normal bowel sounds Extremities: 1+ pitting edema b/l, warm extremities  Neurologic: alert & oriented X3, cranial nerves II-XII grossly intact, strength grossly intact, sensation intact to light touch  Lab Results: Basic Metabolic Panel:  Recent Labs Lab 11/29/13 0017  11/30/13 0415 12/02/13 0319  NA 141  < > 139 138  K 4.3  < > 3.8 4.3  CL 99  < > 99 97  CO2 30  < > 29 31  GLUCOSE 382*  < > 133* 214*  BUN 30*  < > 23 21  CREATININE 1.85*  < > 1.40* 1.27  CALCIUM 8.1*  < > 8.5 8.6  MG 1.7  --   --   --   < > = values in this interval not displayed. CBC:  Recent Labs Lab 11/29/13 0354 11/30/13 0415  WBC 8.4 7.8  HGB 16.8 16.5  HCT 50.1 49.8  MCV 90.3 92.6  PLT 112* 100*   Cardiac Enzymes:  Recent Labs Lab  11/29/13 0017 11/29/13 0354 11/29/13 1240  TROPONINI <0.30 <0.30 <0.30   BNP:  Recent Labs Lab 11/28/13 1050  PROBNP 4116.0*   CBG:  Recent Labs Lab 12/02/13 0618 12/02/13 1118 12/02/13 1641 12/02/13 2123 12/03/13 0641 12/03/13 1148  GLUCAP 197* 183* 213* 364* 124* 137*   Urine Drug Screen: Drugs of Abuse     Component Value Date/Time   LABOPIA NONE DETECTED 11/28/2013 1843   LABOPIA NEG 06/02/2012 0945   COCAINSCRNUR NONE DETECTED 11/28/2013 1843   COCAINSCRNUR NEG 06/02/2012 0945   LABBENZ NONE DETECTED 11/28/2013 1843   LABBENZ NEG 06/02/2012 0945   LABBENZ NEG 02/18/2010 2054   AMPHETMU NONE DETECTED 11/28/2013 1843   AMPHETMU NEG 02/18/2010 2054   THCU NONE DETECTED 11/28/2013 1843   LABBARB NONE DETECTED 11/28/2013 1843   LABBARB NEG 06/02/2012 0945   Urinalysis:  Recent Labs Lab 11/28/13 1844  COLORURINE YELLOW  LABSPEC 1.010  PHURINE 6.0  GLUCOSEU NEGATIVE  HGBUR MODERATE*  BILIRUBINUR NEGATIVE  KETONESUR NEGATIVE  PROTEINUR 100*  UROBILINOGEN 0.2  NITRITE NEGATIVE  LEUKOCYTESUR NEGATIVE    Micro Results: Recent Results (from the past 240 hour(s))  MRSA  PCR SCREENING     Status: None   Collection Time    11/28/13 10:21 PM      Result Value Ref Range Status   MRSA by PCR NEGATIVE  NEGATIVE Final   Comment:            The GeneXpert MRSA Assay (FDA     approved for NASAL specimens     only), is one component of a     comprehensive MRSA colonization     surveillance program. It is not     intended to diagnose MRSA     infection nor to guide or     monitor treatment for     MRSA infections.   Studies/Results: No results found. Medications: I have reviewed the patient's current medications. Scheduled Meds: . amLODipine  10 mg Oral Daily  . aspirin  81 mg Oral Daily  . carvedilol  25 mg Oral BID WC  . furosemide  40 mg Oral Q breakfast  . hydrALAZINE  75 mg Oral 3 times per day  . insulin aspart  0-20 Units Subcutaneous TID WC  . insulin  aspart  0-5 Units Subcutaneous QHS  . insulin aspart  6 Units Subcutaneous TID WC  . insulin glargine  23 Units Subcutaneous BID  . irbesartan  300 mg Oral Daily  . sodium chloride  3 mL Intravenous Q12H   Continuous Infusions: . sodium chloride Stopped (11/29/13 1700)   PRN Meds:.albuterol, diphenhydrAMINE, hydrALAZINE, ondansetron (ZOFRAN) IV, traMADol  Assessment/Plan: Accelerated Hypertension with volume overload 2/2 acute on chronic diastolic CHF: Volume status has improved. Net negative I/O -6L since admission.Patient's blood pressure still elevated to 170-180s/90s. Cardiology evaluated patient yesterday and increased hydralazine to 25mg  TID. Since blood pressure did not respond, cardiology again increased hydralazine today to 75mg  TID. Patient no longer hypoxic with ambulation today. Will not need home O2 on discharge. Given patient is symptomatically much improved and at baseline, ideally we would discharge today with close PCP follow up. The medications do take a few days to reach steady state and if needed can be uptitrated in clinic. However, would like to see patient's blood pressure decrease today prior to discharging.  -increase to hydralazine 75mg  TID -continue lasix 40mg  daily, coreg 25mg  BID, irbesartan 300mg  daily and amlodipine 10mg  daily -cardiology consult, appreciate recs  -may need to consider outpatient w/u for secondary HTN if this problem persists   Type 1 AV block: Found on telemetry overnight while patient sleeping. Per cards this is a benign finding and pt can continue coreg at current dose. -outpatient f/u with cardiology (Dr. Scarlette Calico)  Mechanical fall in setting of degenerative changes on MRI:  Patient with continued pain, which pay be contributing to HTN. Will follow up with orthopaedics, Dr. Sharol Given next week. May need to change this appointment if patient does not leave today.  -PT recommends outpatient PT (written rx given) and 5 wheel rolling walker  (ordered) -appointment with Dr. Sharol Given scheduled for April 6 at 1:15pm -tramadol prn with a one time dose of percocet today to help get blood pressure better controlled   Insulin-dependent Type II Diabetes Mellitus -  CBGs 124-364 overnight. -continue lantus to 23U BID -continue novolog 6U TID WC -Insulin sliding scale with meals and at bedtime  -Carb modified diet  -I have free sample of lantus and novolog from our clinic that I will give to patient prior to discharge  Thrombocytopenia - no active bleeding.  -Monitor for bleeding  -outpatient recheck  Hyperlipidemia -  Last lipid panel on 05/11/13 with hypercholesteremia (219), hypertriglyceridemia (400), low HDL (39), and high LDL (100).  -Consider initiating statin therapy (LFT normal on 10/25/13) - will not start as inpatient given patient's hx of noncompliance and we are already making numerous medication changes  Substance Abuse - UDS negative on admission. Pt with cocaine positive UDS on 10/25/13. Patient currently smokes 1/2ppd. -Encourage tobacco cessation- patient agrees to stop smoking to use this money for his medications  Diet: Carb modified   DVT Ppx: SQ Heparin TID   Code: Full   Dispo: Disposition is deferred at this time, awaiting improvement of current medical problems.  Anticipated discharge in approximately 1 day(s). Perhaps today, pending BP recheck.  The patient does have a current PCP Otho Bellows, MD) and does need an [facility] hospital follow-up appointment after discharge.  The patient does not have transportation limitations that hinder transportation to clinic appointments.  .Services Needed at time of discharge: Y = Yes, Blank = No PT:   OT:   RN:   Equipment:   Other:     LOS: 5 days   Rebecca Eaton, MD 12/03/2013, 2:49 PM

## 2013-12-04 DIAGNOSIS — M47817 Spondylosis without myelopathy or radiculopathy, lumbosacral region: Secondary | ICD-10-CM

## 2013-12-04 DIAGNOSIS — D696 Thrombocytopenia, unspecified: Secondary | ICD-10-CM

## 2013-12-04 LAB — GLUCOSE, CAPILLARY
GLUCOSE-CAPILLARY: 262 mg/dL — AB (ref 70–99)
Glucose-Capillary: 118 mg/dL — ABNORMAL HIGH (ref 70–99)

## 2013-12-04 MED ORDER — HYDRALAZINE HCL 100 MG PO TABS: 100.0000 mg | ORAL_TABLET | Freq: Three times a day (TID) | ORAL | Status: DC

## 2013-12-04 MED ORDER — HYDRALAZINE HCL 50 MG PO TABS
100.0000 mg | ORAL_TABLET | Freq: Three times a day (TID) | ORAL | Status: DC
  Filled 2013-12-04 (×3): qty 2

## 2013-12-04 MED ORDER — OXYCODONE-ACETAMINOPHEN 5-325 MG PO TABS
2.0000 | ORAL_TABLET | Freq: Once | ORAL | Status: AC
  Administered 2013-12-04: 2 via ORAL
  Filled 2013-12-04: qty 2

## 2013-12-04 MED ORDER — OLMESARTAN MEDOXOMIL 40 MG PO TABS: 40.0000 mg | ORAL_TABLET | Freq: Every day | ORAL | Status: DC

## 2013-12-04 MED ORDER — FUROSEMIDE 40 MG PO TABS: 40.0000 mg | ORAL_TABLET | Freq: Every day | ORAL | Status: DC

## 2013-12-04 NOTE — Progress Notes (Signed)
D/C IV, D/C Tele, D/C instructions, paperwork, and prescriptions given to and reviewed with pt., pt. Showed no signs or symptoms of distress, pt. Left unit via wheelchair, pt. Transported home via spouse.

## 2013-12-04 NOTE — Progress Notes (Signed)
Subjective: Complains of back and leg pain, the reason he came in.  Objective: Vital signs in last 24 hours: Temp:  [98.2 F (36.8 C)-98.8 F (37.1 C)] 98.2 F (36.8 C) (04/06 0534) Pulse Rate:  [64-80] 76 (04/06 0534) Resp:  [19-20] 20 (04/06 0534) BP: (172-191)/(93-108) 191/98 mmHg (04/06 0534) SpO2:  [94 %] 94 % (04/06 0534) Last BM Date: 12/03/13  Intake/Output from previous day: 04/05 0701 - 04/06 0700 In: 825 [P.O.:822; I.V.:3] Out: 2000 [Urine:2000] Intake/Output this shift: Total I/O In: 342 [P.O.:342] Out: 1100 [Urine:1100]  Medications Current Facility-Administered Medications  Medication Dose Route Frequency Provider Last Rate Last Dose  . 0.9 %  sodium chloride infusion   Intravenous Continuous Axel Filler, MD      . albuterol (PROVENTIL) (2.5 MG/3ML) 0.083% nebulizer solution 3 mL  3 mL Inhalation Q4H PRN Otho Bellows, MD      . amLODipine (NORVASC) tablet 10 mg  10 mg Oral Daily Otho Bellows, MD   10 mg at 12/03/13 0912  . aspirin chewable tablet 81 mg  81 mg Oral Daily Otho Bellows, MD   81 mg at 12/03/13 1151  . carvedilol (COREG) tablet 25 mg  25 mg Oral BID WC Otho Bellows, MD   25 mg at 12/03/13 1800  . diphenhydrAMINE (BENADRYL) capsule 25 mg  25 mg Oral Q6H PRN Rebecca Eaton, MD      . furosemide (LASIX) tablet 40 mg  40 mg Oral Q breakfast Rebecca Eaton, MD   40 mg at 12/03/13 0912  . hydrALAZINE (APRESOLINE) injection 5-10 mg  5-10 mg Intravenous Q6H PRN Axel Filler, MD   5 mg at 12/02/13 0630  . hydrALAZINE (APRESOLINE) tablet 75 mg  75 mg Oral 3 times per day Arnoldo Lenis, MD   75 mg at 12/04/13 0540  . insulin aspart (novoLOG) injection 0-20 Units  0-20 Units Subcutaneous TID WC Otho Bellows, MD   3 Units at 12/03/13 1249  . insulin aspart (novoLOG) injection 0-5 Units  0-5 Units Subcutaneous QHS Otho Bellows, MD   5 Units at 12/02/13 2147  . insulin aspart (novoLOG) injection 6 Units  6 Units Subcutaneous  TID WC Rebecca Eaton, MD   6 Units at 12/03/13 1800  . insulin glargine (LANTUS) injection 23 Units  23 Units Subcutaneous BID Rebecca Eaton, MD   23 Units at 12/03/13 2236  . irbesartan (AVAPRO) tablet 300 mg  300 mg Oral Daily Rebecca Eaton, MD   300 mg at 12/03/13 0912  . ondansetron (ZOFRAN) injection 4 mg  4 mg Intravenous Q6H PRN Ejiroghene Emokpae, MD      . sodium chloride 0.9 % injection 3 mL  3 mL Intravenous Q12H Otho Bellows, MD   3 mL at 12/03/13 2239  . traMADol (ULTRAM) tablet 50 mg  50 mg Oral Q12H PRN Rebecca Eaton, MD   50 mg at 12/04/13 0303    PE: General appearance: alert, cooperative and no distress Lungs: clear to auscultation bilaterally Heart: regular rate and rhythm, S1, S2 normal, no murmur, click, rub or gallop Extremities: 1+ LEE Pulses: 2+ and symmetric Skin: Warm and dry Neurologic: Grossly normal  Lab Results:  No results found for this basename: WBC, HGB, HCT, PLT,  in the last 72 hours BMET  Recent Labs  12/02/13 0319  NA 138  K 4.3  CL 97  CO2 31  GLUCOSE 214*  BUN 21  CREATININE 1.27  CALCIUM  8.6    Assessment/Plan   Principal Problem:    Acute diastolic heart failure Net fluids -1.2L/-7.4.  Lasix at 40mg  QAM.   EF 50-55  Grade one diastolic dys.  Wt 291.4.  SCr WNL.        Hypertensive emergency Amlodipine 10, Coreg 25, hydralazine 75 Q8, avapro 300.  Renal artery duplex , June 2014:  No renal artery stenosis.  Increase hydralazine to 100mg .   May need to repeat dopplers.      Second degree AVB type II  Maintaining SR currently but had 2nd deg AVB at 0229hrs this morning.  Continue to monitor.  Beta blocker may need to be reduced   LOS: 6 days    HAGER, BRYAN PA-C 12/04/2013 6:22 AM  Patient seen, examined. Available data reviewed. Agree with findings, assessment, and plan as outlined by Tarri Fuller, PA-C. Exam reveals an alert, oriented male in no distress. Jugular venous pressure is normal. Lungs are clear  bilaterally. Heart is regular rate and rhythm without murmur or gallop. There is trace pretibial edema. Echocardiogram reviewed demonstrating severe left ventricular hypertrophy with preserved LV systolic function. The patient has acute diastolic heart failure related to long-standing uncontrolled hypertension with acute hypertensive emergency. Blood pressure remains significantly elevated or, but improved from admission. He will need close outpatient followup with primary care to continue to manage his hypertension. I will also arrange cardiology followup. All of this is complicated by his severe back/right leg pain. He is now on multidrug therapy with amlodipine, carvedilol, furosemide, hydralazine, and irbesartan. Would discharge him on these medicines with close followup. I suspect he will need clonidine added as an outpatient.  Sherren Mocha, M.D. 12/04/2013 9:48 AM

## 2013-12-04 NOTE — Discharge Instructions (Signed)
IT IS IMPORTANT THAT YOU TAKE YOUR BLOOD PRESSURE MEDICATIONS: AMLODIPINE 10MG  DAILY, HYDRALAZINE 100MG  THREE TIMES PER DAY, LASIX 40MG  DAILY, COREG 25MG  TWICE PER DAY, BENICAR 40MG  DAILY  YOU NEED TO FOLLOW UP WITH ORTHOPAEDICS FOR YOUR BACK PAIN. THEY WILL NEED TO PRESCRIBE YOUR PAIN MEDICATIONS.  PLEASE FOLLOW UP WITH OUR INTERNAL MEDICINE CLINIC ON 4/10. PLEASE FOLLOW UP WITH CARDIOLOGY AS SCHEDULED.  Hypertension As your heart beats, it forces blood through your arteries. This force is your blood pressure. If the pressure is too high, it is called hypertension (HTN) or high blood pressure. HTN is dangerous because you may have it and not know it. High blood pressure may mean that your heart has to work harder to pump blood. Your arteries may be narrow or stiff. The extra work puts you at risk for heart disease, stroke, and other problems.  Blood pressure consists of two numbers, a higher number over a lower, 110/72, for example. It is stated as "110 over 72." The ideal is below 120 for the top number (systolic) and under 80 for the bottom (diastolic). Write down your blood pressure today. You should pay close attention to your blood pressure if you have certain conditions such as:  Heart failure.  Prior heart attack.  Diabetes  Chronic kidney disease.  Prior stroke.  Multiple risk factors for heart disease. To see if you have HTN, your blood pressure should be measured while you are seated with your arm held at the level of the heart. It should be measured at least twice. A one-time elevated blood pressure reading (especially in the Emergency Department) does not mean that you need treatment. There may be conditions in which the blood pressure is different between your right and left arms. It is important to see your caregiver soon for a recheck. Most people have essential hypertension which means that there is not a specific cause. This type of high blood pressure may be lowered by  changing lifestyle factors such as:  Stress.  Smoking.  Lack of exercise.  Excessive weight.  Drug/tobacco/alcohol use.  Eating less salt. Most people do not have symptoms from high blood pressure until it has caused damage to the body. Effective treatment can often prevent, delay or reduce that damage. TREATMENT  When a cause has been identified, treatment for high blood pressure is directed at the cause. There are a large number of medications to treat HTN. These fall into several categories, and your caregiver will help you select the medicines that are best for you. Medications may have side effects. You should review side effects with your caregiver. If your blood pressure stays high after you have made lifestyle changes or started on medicines,   Your medication(s) may need to be changed.  Other problems may need to be addressed.  Be certain you understand your prescriptions, and know how and when to take your medicine.  Be sure to follow up with your caregiver within the time frame advised (usually within two weeks) to have your blood pressure rechecked and to review your medications.  If you are taking more than one medicine to lower your blood pressure, make sure you know how and at what times they should be taken. Taking two medicines at the same time can result in blood pressure that is too low. SEEK IMMEDIATE MEDICAL CARE IF:  You develop a severe headache, blurred or changing vision, or confusion.  You have unusual weakness or numbness, or a faint feeling.  You  have severe chest or abdominal pain, vomiting, or breathing problems. MAKE SURE YOU:   Understand these instructions.  Will watch your condition.  Will get help right away if you are not doing well or get worse. Document Released: 08/17/2005 Document Revised: 11/09/2011 Document Reviewed: 04/06/2008 [facility] Patient Information 2014 [place].   Heart Failure Heart failure is a condition in  which the heart has trouble pumping blood. This means your heart does not pump blood efficiently for your body to work well. In some cases of heart failure, fluid may back up into your lungs or you may have swelling (edema) in your lower legs. Heart failure is usually a long-term (chronic) condition. It is important for you to take good care of yourself and follow your caregiver's treatment plan. CAUSES  Some health conditions can cause heart failure. Those health conditions include:  High blood pressure (hypertension) causes the heart muscle to work harder than normal. When pressure in the blood vessels is high, the heart needs to pump (contract) with more force in order to circulate blood throughout the body. High blood pressure eventually causes the heart to become stiff and weak.  Coronary artery disease (CAD) is the buildup of cholesterol and fat (plaque) in the arteries of the heart. The blockage in the arteries deprives the heart muscle of oxygen and blood. This can cause chest pain and may lead to a heart attack. High blood pressure can also contribute to CAD.  Heart attack (myocardial infarction) occurs when 1 or more arteries in the heart become blocked. The loss of oxygen damages the muscle tissue of the heart. When this happens, part of the heart muscle dies. The injured tissue does not contract as well and weakens the heart's ability to pump blood.  Abnormal heart valves can cause heart failure when the heart valves do not open and close properly. This makes the heart muscle pump harder to keep the blood flowing.  Heart muscle disease (cardiomyopathy or myocarditis) is damage to the heart muscle from a variety of causes. These can include drug or alcohol abuse, infections, or unknown reasons. These can increase the risk of heart failure.  Lung disease makes the heart work harder because the lungs do not work properly. This can cause a strain on the heart, leading it to fail.  Diabetes  increases the risk of heart failure. High blood sugar contributes to high fat (lipid) levels in the blood. Diabetes can also cause slow damage to tiny blood vessels that carry important nutrients to the heart muscle. When the heart does not get enough oxygen and food, it can cause the heart to become weak and stiff. This leads to a heart that does not contract efficiently.  Other conditions can contribute to heart failure. These include abnormal heart rhythms, thyroid problems, and low blood counts (anemia). Certain unhealthy behaviors can increase the risk of heart failure. Those unhealthy behaviors include:  Being overweight.  Smoking or chewing tobacco.  Eating foods high in fat and cholesterol.  Abusing illicit drugs or alcohol.  Lacking physical activity. SYMPTOMS  Heart failure symptoms may vary and can be hard to detect. Symptoms may include:  Shortness of breath with activity, such as climbing stairs.  Persistent cough.  Swelling of the feet, ankles, legs, or abdomen.  Unexplained weight gain.  Difficulty breathing when lying flat (orthopnea).  Waking from sleep because of the need to sit up and get more air.  Rapid heartbeat.  Fatigue and loss of energy.  Feeling  lightheaded, dizzy, or close to fainting.  Loss of appetite.  Nausea.  Increased urination during the night (nocturia). DIAGNOSIS  A diagnosis of heart failure is based on your history, symptoms, physical examination, and diagnostic tests. Diagnostic tests for heart failure may include:  Echocardiography.  Electrocardiography.  Chest X-ray.  Blood tests.  Exercise stress test.  Cardiac angiography.  Radionuclide scans. TREATMENT  Treatment is aimed at managing the symptoms of heart failure. Medicines, behavioral changes, or surgical intervention may be necessary to treat heart failure.  Medicines to help treat heart failure may include:  Angiotensin-converting enzyme (ACE) inhibitors.  This type of medicine blocks the effects of a blood protein called angiotensin-converting enzyme. ACE inhibitors relax (dilate) the blood vessels and help lower blood pressure.  Angiotensin receptor blockers. This type of medicine blocks the actions of a blood protein called angiotensin. Angiotensin receptor blockers dilate the blood vessels and help lower blood pressure.  Water pills (diuretics). Diuretics cause the kidneys to remove salt and water from the blood. The extra fluid is removed through urination. This loss of extra fluid lowers the volume of blood the heart pumps.  Beta blockers. These prevent the heart from beating too fast and improve heart muscle strength.  Digitalis. This increases the force of the heartbeat.  Healthy behavior changes include:  Obtaining and maintaining a healthy weight.  Stopping smoking or chewing tobacco.  Eating heart healthy foods.  Limiting or avoiding alcohol.  Stopping illicit drug use.  Physical activity as directed by your caregiver.  Surgical treatment for heart failure may include:  A procedure to open blocked arteries, repair damaged heart valves, or remove damaged heart muscle tissue.  A pacemaker to improve heart muscle function and control certain abnormal heart rhythms.  An internal cardioverter defibrillator to treat certain serious abnormal heart rhythms.  A left ventricular assist device to assist the pumping ability of the heart. HOME CARE INSTRUCTIONS   Take your medicine as directed by your caregiver. Medicines are important in reducing the workload of your heart, slowing the progression of heart failure, and improving your symptoms.  Do not stop taking your medicine unless directed by your caregiver.  Do not skip any dose of medicine.  Refill your prescriptions before you run out of medicine. Your medicines are needed every day.  Take over-the-counter medicine only as directed by your caregiver or  pharmacist.  Engage in moderate physical activity if directed by your caregiver. Moderate physical activity can benefit some people. The elderly and people with severe heart failure should consult with a caregiver for physical activity recommendations.  Eat heart healthy foods. Food choices should be free of trans fat and low in saturated fat, cholesterol, and salt (sodium). Healthy choices include fresh or frozen fruits and vegetables, fish, lean meats, legumes, fat-free or low-fat dairy products, and whole grain or high fiber foods. Talk to a dietitian to learn more about heart healthy foods.  Limit sodium if directed by your caregiver. Sodium restriction may reduce symptoms of heart failure in some people. Talk to a dietitian to learn more about heart healthy seasonings.  Use healthy cooking methods. Healthy cooking methods include roasting, grilling, broiling, baking, poaching, steaming, or stir-frying. Talk to a dietitian to learn more about healthy cooking methods.  Limit fluids if directed by your caregiver. Fluid restriction may reduce symptoms of heart failure in some people.  Weigh yourself every day. Daily weights are important in the early recognition of excess fluid. You should weigh yourself every morning  after you urinate and before you eat breakfast. Wear the same amount of clothing each time you weigh yourself. Record your daily weight. Provide your caregiver with your weight record.  Monitor and record your blood pressure if directed by your caregiver.  Check your pulse if directed by your caregiver.  Lose weight if directed by your caregiver. Weight loss may reduce symptoms of heart failure in some people.  Stop smoking or chewing tobacco. Nicotine makes your heart work harder by causing your blood vessels to constrict. Do not use nicotine gum or patches before talking to your caregiver.  Schedule and attend follow-up visits as directed by your caregiver. It is important to  keep all your appointments.  Limit alcohol intake to no more than 1 drink per day for nonpregnant women and 2 drinks per day for men. Drinking more than that is harmful to your heart. Tell your caregiver if you drink alcohol several times a week. Talk with your caregiver about whether alcohol is safe for you. If your heart has already been damaged by alcohol or you have severe heart failure, drinking alcohol should be stopped completely.  Stop illicit drug use.  Stay up-to-date with immunizations. It is especially important to prevent respiratory infections through current pneumococcal and influenza immunizations.  Manage other health conditions such as hypertension, diabetes, thyroid disease, or abnormal heart rhythms as directed by your caregiver.  Learn to manage stress.  Plan rest periods when fatigued.  Learn strategies to manage high temperatures. If the weather is extremely hot:  Avoid vigorous physical activity.  Use air conditioning or fans or seek a cooler location.  Avoid caffeine and alcohol.  Wear loose-fitting, lightweight, and light-colored clothing.  Learn strategies to manage cold temperatures. If the weather is extremely cold:  Avoid vigorous physical activity.  Layer clothes.  Wear mittens or gloves, a hat, and a scarf when going outside.  Avoid alcohol.  Obtain ongoing education and support as needed.  Participate or seek rehabilitation as needed to maintain or improve independence and quality of life. SEEK MEDICAL CARE IF:   Your weight increases by 03 lb/1.4 kg in 1 day or 05 lb/2.3 kg in a week.  You have increasing shortness of breath that is unusual for you.  You are unable to participate in your usual physical activities.  You tire easily.  You cough more than normal, especially with physical activity.  You have any or more swelling in areas such as your hands, feet, ankles, or abdomen.  You are unable to sleep because it is hard to  breathe.  You feel like your heart is beating fast (palpitations).  You become dizzy or lightheaded upon standing up. SEEK IMMEDIATE MEDICAL CARE IF:   You have difficulty breathing.  There is a change in mental status such as decreased alertness or difficulty with concentration.  You have a pain or discomfort in your chest.  You have an episode of fainting (syncope). MAKE SURE YOU:   Understand these instructions.  Will watch your condition.  Will get help right away if you are not doing well or get worse. Document Released: 08/17/2005 Document Revised: 12/12/2012 Document Reviewed: 09/08/2012 [facility] Patient Information 2014 [place], [place].

## 2013-12-04 NOTE — Progress Notes (Signed)
Subjective: Patient complains of continued back pain. Stable DOE, no SOB at rest or CP, N/V, abd pain. He feels ready to go home. He has an appointment with Ortho today for his back pain that he wants to attend so requesting early discharge. I spoke with cardiology today and they agree with discharge.  Objective: Vital signs in last 24 hours: Filed Vitals:   12/04/13 0534 12/04/13 0641 12/04/13 0645 12/04/13 0901  BP: 191/98  169/103 177/102  Pulse: 76  82 76  Temp: 98.2 F (36.8 C)  97.9 F (36.6 C)   TempSrc: Oral  Oral   Resp: 20  20   Height:      Weight:  132.178 kg (291 lb 6.4 oz)    SpO2: 94%  97%    Weight change: -0.862 kg (-1 lb 14.4 oz)  Net is negative 7.4L since he was admitted  Intake/Output Summary (Last 24 hours) at 12/04/13 0955 Last data filed at 12/04/13 0856  Gross per 24 hour  Intake    825 ml  Output   2301 ml  Net  -1476 ml   Physical Exam General: alert, cooperative, and in no apparent distress; sitting upright in chair; has back pain with any movement HEENT: vision grossly intact Neck: supple Lungs: lungs CTAB, normal work of respiration, no wheezes, rhonchi or rales; no longer requiring supplemental oxygen either at rest or with ambulation Heart: regular rate and rhythm, no murmurs, gallops, or rubs Abdomen: soft, non-tender, non-distended, normal bowel sounds Extremities: trace to 1+ pitting edema b/l, warm extremities  Neurologic: alert & oriented X3, cranial nerves II-XII grossly intact, strength grossly intact, sensation intact to light touch  Lab Results: Basic Metabolic Panel:  Recent Labs Lab 11/29/13 0017  11/30/13 0415 12/02/13 0319  NA 141  < > 139 138  K 4.3  < > 3.8 4.3  CL 99  < > 99 97  CO2 30  < > 29 31  GLUCOSE 382*  < > 133* 214*  BUN 30*  < > 23 21  CREATININE 1.85*  < > 1.40* 1.27  CALCIUM 8.1*  < > 8.5 8.6  MG 1.7  --   --   --   < > = values in this interval not displayed. CBC:  Recent Labs Lab  11/29/13 0354 11/30/13 0415  WBC 8.4 7.8  HGB 16.8 16.5  HCT 50.1 49.8  MCV 90.3 92.6  PLT 112* 100*   Cardiac Enzymes:  Recent Labs Lab 11/29/13 0017 11/29/13 0354 11/29/13 1240  TROPONINI <0.30 <0.30 <0.30   BNP:  Recent Labs Lab 11/28/13 1050  PROBNP 4116.0*   CBG:  Recent Labs Lab 12/02/13 2123 12/03/13 0641 12/03/13 1148 12/03/13 1615 12/03/13 2121 12/04/13 0629  GLUCAP 364* 124* 137* 80 131* 118*   Urine Drug Screen: Drugs of Abuse     Component Value Date/Time   LABOPIA NONE DETECTED 11/28/2013 1843   LABOPIA NEG 06/02/2012 0945   COCAINSCRNUR NONE DETECTED 11/28/2013 1843   COCAINSCRNUR NEG 06/02/2012 0945   LABBENZ NONE DETECTED 11/28/2013 1843   LABBENZ NEG 06/02/2012 0945   LABBENZ NEG 02/18/2010 2054   AMPHETMU NONE DETECTED 11/28/2013 1843   AMPHETMU NEG 02/18/2010 2054   THCU NONE DETECTED 11/28/2013 1843   LABBARB NONE DETECTED 11/28/2013 1843   LABBARB NEG 06/02/2012 0945   Urinalysis:  Recent Labs Lab 11/28/13 1844  COLORURINE YELLOW  LABSPEC 1.010  PHURINE 6.0  [place] MODERATE*  BILIRUBINUR NEGATIVE  KETONESUR  NEGATIVE  PROTEINUR 100*  UROBILINOGEN 0.2  NITRITE NEGATIVE  LEUKOCYTESUR NEGATIVE    Micro Results: Recent Results (from the past 240 hour(s))  MRSA PCR SCREENING     Status: None   Collection Time    11/28/13 10:21 PM      Result Value Ref Range Status   MRSA by PCR NEGATIVE  NEGATIVE Final   Comment:            The GeneXpert MRSA Assay (FDA     approved for NASAL specimens     only), is one component of a     comprehensive MRSA colonization     surveillance program. It is not     intended to diagnose MRSA     infection nor to guide or     monitor treatment for     MRSA infections.   Studies/Results: No results found. Medications: I have reviewed the patient's current medications. Scheduled Meds: . amLODipine  10 mg Oral Daily  . aspirin  81 mg Oral Daily  . carvedilol  25 mg Oral BID WC   . furosemide  40 mg Oral Q breakfast  . hydrALAZINE  100 mg Oral 3 times per day  . insulin aspart  0-20 Units Subcutaneous TID WC  . insulin aspart  0-5 Units Subcutaneous QHS  . insulin aspart  6 Units Subcutaneous TID WC  . insulin glargine  23 Units Subcutaneous BID  . irbesartan  300 mg Oral Daily  . oxyCODONE-acetaminophen  2 tablet Oral Once  . sodium chloride  3 mL Intravenous Q12H   Continuous Infusions: . sodium chloride Stopped (11/29/13 1700)   PRN Meds:.albuterol, diphenhydrAMINE, hydrALAZINE, ondansetron (ZOFRAN) IV, traMADol  Assessment/Plan: Accelerated Hypertension with volume overload 2/2 acute on chronic diastolic CHF: Patient remains stable. He is euvolemic, net negative 7L since he was admitted. Still with some DOE, though this is stable and perhaps related to ?COPD/smoking history. No longer desatting with ambulation so no need for home oxygen. Blood pressure remains higher than goal, last BP 163/103, which is improved from yesterday. Cardiology again increased hydralazine today to 100mg  TID. It will take a few days for all medications to reach steady state and maximum effect seen. Patient also in significant pain, which is likely contributing to persistently elevated BP. Patient will follow up in the [facility] on 4/10 and with cardiology on 4/20. I spoke with cardiology today and they agree with discharge and PCP follow up for further BP management. Will give percocet prior to discharge today to obtain better control of pain.  -increase hydralazine to 100mg  TID -continue lasix 40mg  daily, coreg 25mg  BID, irbesartan 300mg  daily and amlodipine 10mg  daily -cardiology consult, appreciate recs  -may need to consider outpatient w/u for secondary HTN if this problem persists  -patient tells me his new plan for obtaining medications is his son will pay for them [facility]  Mobitz Type 1 AV block: Occurred with sleep the past two nights. Per cards this is a benign finding and pt can  continue coreg at current dose. However, if this persists we may need to down titrate the coreg.  -outpatient f/u with cardiology on 4/20 scheduled  Mechanical fall in setting of degenerative changes on MRI:  Patient with continued pain, which pay be contributing to HTN. Will follow up with orthopaedics, Dr. Sharol Given today. Would prefer if ortho would manage his pain medications as PCP not comfortable with this given prior UDS + cocaine.  - outpatient PT (written rx  given) and 5 wheel rolling walker (ordered) -appointment with Dr. Sharol Given scheduled for April 6 at 1:15pm -tramadol prn w/ percocet once more this AM prior to discharge  Insulin-dependent Type II Diabetes Mellitus -  CBGs 80-137 overnight. Plan to discharge on home regimen. -continue lantus to 23U BID -continue novolog 6U TID WC -Insulin sliding scale with meals and at bedtime  -Carb modified diet  -I have free sample of lantus and novolog from our clinic that I will give to patient prior to discharge  Thrombocytopenia - no active bleeding.  -Monitor for bleeding  -outpatient recheck  Hyperlipidemia - Last lipid panel on 05/11/13 with hypercholesteremia (219), hypertriglyceridemia (400), low HDL (39), and high LDL (100).  -Consider initiating statin therapy (LFT normal on 10/25/13) - will not start as inpatient given patient's hx of noncompliance and we are already making numerous medication changes  Substance Abuse - UDS negative on admission. Pt with cocaine positive UDS on 10/25/13. Patient currently smokes 1/2ppd. -Encourage tobacco cessation- patient agrees to stop smoking to use this money for his medications  Diet: Carb modified   DVT Ppx: SQ Heparin TID   Code: Full   Dispo: Discharge today.  The patient does have a current PCP Otho Bellows, MD) and does need an [facility] hospital follow-up appointment after discharge.  The patient does not have transportation limitations that hinder transportation to clinic  appointments.  .Services Needed at time of discharge: Y = Yes, Blank = No PT:   OT:   RN:   Equipment:   Other:     LOS: 6 days   Rebecca Eaton, MD 12/04/2013, 9:55 AM

## 2013-12-05 ENCOUNTER — Other Ambulatory Visit: Payer: Self-pay | Admitting: Internal Medicine

## 2013-12-05 ENCOUNTER — Telehealth (HOSPITAL_BASED_OUTPATIENT_CLINIC_OR_DEPARTMENT_OTHER): Payer: Self-pay

## 2013-12-05 MED ORDER — IRBESARTAN 300 MG PO TABS: 300.0000 mg | ORAL_TABLET | Freq: Every day | ORAL | Status: DC

## 2013-12-05 NOTE — Telephone Encounter (Signed)
Pt calling for change in BP med (too expensive).  Pt admitted to hospital provided him w/office @# for Dr Lenna Sciara. Marinda Elk

## 2013-12-07 ENCOUNTER — Other Ambulatory Visit: Payer: Self-pay | Admitting: *Deleted

## 2013-12-08 ENCOUNTER — Ambulatory Visit: Payer: Self-pay | Admitting: Internal Medicine

## 2013-12-08 ENCOUNTER — Encounter: Payer: Self-pay | Admitting: Internal Medicine

## 2013-12-08 NOTE — Telephone Encounter (Signed)
Called pt; no answer - message left to call the clinic.

## 2013-12-08 NOTE — Telephone Encounter (Signed)
He was just given samples of these at hospital discharge. Does he not have them? He was supposed to follow up in the clinic today but did not go. Please call him to find out why. Thanks.

## 2013-12-11 ENCOUNTER — Telehealth: Payer: Self-pay | Admitting: *Deleted

## 2013-12-11 NOTE — Telephone Encounter (Signed)
Pt called stating he was still having low back  pain with radiation down rt leg.  He states Dr Sharol Given will not give him any pain meds.  I advised him to call Dr Jess Barters office for advice on orthopedic pain.  I reviewed where he was to get PT. Pt d/c from hospital by Dr Mechele Claude on 4/6  I will ask Dr Sharl Ma to call pt @ [phone]

## 2013-12-12 MED ORDER — INSULIN GLARGINE 100 UNIT/ML ~~LOC~~ SOLN: 25.0000 [IU] | Freq: Two times a day (BID) | SUBCUTANEOUS | Status: DC

## 2013-12-12 MED ORDER — INSULIN ASPART 100 UNIT/ML ~~LOC~~ SOLN: 4.0000 [IU] | Freq: Three times a day (TID) | SUBCUTANEOUS | Status: DC

## 2013-12-13 NOTE — Telephone Encounter (Signed)
I spoke to PCP about patient's continued pain. Please call patient and let him know that we cannot prescribe any narcotics for him. If he thinks is pain is worsening or changing, he needs to be scheduled for an appointment in clinic or more ideally be evaluated by Dr. Sharol Given.

## 2013-12-15 NOTE — Telephone Encounter (Signed)
Pt informed and voices understanding 

## 2013-12-18 ENCOUNTER — Encounter: Payer: Self-pay | Admitting: Physician Assistant

## 2014-01-01 ENCOUNTER — Encounter: Payer: Self-pay | Admitting: Physician Assistant

## 2014-01-04 ENCOUNTER — Ambulatory Visit: Payer: Self-pay | Admitting: Internal Medicine

## 2014-01-12 ENCOUNTER — Encounter: Payer: Self-pay | Admitting: Physician Assistant

## 2014-01-16 ENCOUNTER — Ambulatory Visit (INDEPENDENT_AMBULATORY_CARE_PROVIDER_SITE_OTHER): Payer: BC Managed Care – PPO | Admitting: Internal Medicine

## 2014-01-16 ENCOUNTER — Encounter: Payer: Self-pay | Admitting: Internal Medicine

## 2014-01-16 VITALS — BP 138/87 | HR 82 | Temp 99.3°F | Ht 71.0 in | Wt 292.7 lb

## 2014-01-16 DIAGNOSIS — I10 Essential (primary) hypertension: Secondary | ICD-10-CM

## 2014-01-16 DIAGNOSIS — F329 Major depressive disorder, single episode, unspecified: Secondary | ICD-10-CM

## 2014-01-16 DIAGNOSIS — E11311 Type 2 diabetes mellitus with unspecified diabetic retinopathy with macular edema: Secondary | ICD-10-CM

## 2014-01-16 DIAGNOSIS — H6090 Unspecified otitis externa, unspecified ear: Secondary | ICD-10-CM | POA: Insufficient documentation

## 2014-01-16 DIAGNOSIS — E1136 Type 2 diabetes mellitus with diabetic cataract: Secondary | ICD-10-CM

## 2014-01-16 DIAGNOSIS — E1142 Type 2 diabetes mellitus with diabetic polyneuropathy: Secondary | ICD-10-CM

## 2014-01-16 DIAGNOSIS — H60399 Other infective otitis externa, unspecified ear: Secondary | ICD-10-CM

## 2014-01-16 DIAGNOSIS — E1139 Type 2 diabetes mellitus with other diabetic ophthalmic complication: Secondary | ICD-10-CM

## 2014-01-16 DIAGNOSIS — E11319 Type 2 diabetes mellitus with unspecified diabetic retinopathy without macular edema: Secondary | ICD-10-CM

## 2014-01-16 DIAGNOSIS — F3289 Other specified depressive episodes: Secondary | ICD-10-CM

## 2014-01-16 DIAGNOSIS — E1149 Type 2 diabetes mellitus with other diabetic neurological complication: Secondary | ICD-10-CM

## 2014-01-16 DIAGNOSIS — F32A Depression, unspecified: Secondary | ICD-10-CM

## 2014-01-16 LAB — BASIC METABOLIC PANEL
BUN: 25 mg/dL — ABNORMAL HIGH (ref 6–23)
CO2: 28 mEq/L (ref 19–32)
CREATININE: 1.3 mg/dL (ref 0.50–1.35)
Calcium: 8.8 mg/dL (ref 8.4–10.5)
Chloride: 104 mEq/L (ref 96–112)
GLUCOSE: 176 mg/dL — AB (ref 70–99)
Potassium: 4.1 mEq/L (ref 3.5–5.3)
SODIUM: 141 meq/L (ref 135–145)

## 2014-01-16 LAB — CBC
HEMATOCRIT: 45.9 % (ref 39.0–52.0)
Hemoglobin: 15.6 g/dL (ref 13.0–17.0)
MCH: 29.7 pg (ref 26.0–34.0)
MCHC: 34 g/dL (ref 30.0–36.0)
MCV: 87.4 fL (ref 78.0–100.0)
Platelets: 168 10*3/uL (ref 150–400)
RBC: 5.25 MIL/uL (ref 4.22–5.81)
RDW: 13.8 % (ref 11.5–15.5)
WBC: 7.2 10*3/uL (ref 4.0–10.5)

## 2014-01-16 LAB — GLUCOSE, CAPILLARY: GLUCOSE-CAPILLARY: 206 mg/dL — AB (ref 70–99)

## 2014-01-16 MED ORDER — CIPROFLOXACIN-DEXAMETHASONE 0.3-0.1 % OT SUSP: 4.0000 [drp] | Freq: Two times a day (BID) | OTIC | Status: AC

## 2014-01-16 MED ORDER — FLUOXETINE HCL 20 MG PO TABS: 20.0000 mg | ORAL_TABLET | Freq: Every day | ORAL | Status: DC

## 2014-01-16 MED ORDER — FLUOXETINE HCL 20 MG PO CAPS: 20.0000 mg | ORAL_CAPSULE | Freq: Every day | ORAL | Status: DC

## 2014-01-16 MED ORDER — AMLODIPINE BESYLATE 10 MG PO TABS: 10.0000 mg | ORAL_TABLET | Freq: Every day | ORAL | Status: DC

## 2014-01-16 MED ORDER — PREGABALIN 100 MG PO CAPS: 100.0000 mg | ORAL_CAPSULE | Freq: Two times a day (BID) | ORAL | Status: DC

## 2014-01-16 NOTE — Patient Instructions (Signed)
General Instructions: For your pain, we are restarting Lyrica.  Take 1 tablet twice per day as needed for pain.  For your depression, we are starting Fluoxetine (Prozac), take 1 tablet once per day.  It will take about 4 weeks to see an effect from this medication.  We are checking some basic lab work today, and will inform you if any of the results are abnormal.  Please bring your glucometer to your next visit, and we can discuss your diabetes.  Please return for a follow-up visit in 1 month.  Thank you for bringing your medicines today. This helps [place] keep you safe from mistakes.   Progress Toward Treatment Goals:  Treatment Goal 01/16/2014  Hemoglobin A1C unable to assess  Blood pressure at goal  Stop smoking -  Prevent falls unchanged    Self Care Goals & Plans:  Self Care Goal 01/16/2014  Manage my medications take my medicines as prescribed; bring my medications to every visit; refill my medications on time  Monitor my health -  Eat healthy foods drink diet soda or water instead of juice or soda; eat more vegetables; eat foods that are low in salt; eat baked foods instead of fried foods; eat fruit for snacks and desserts  Be physically active -  Stop smoking -  Meeting treatment goals -    Home Blood Glucose Monitoring 09/26/2013  Check my blood sugar 3 times a day  When to check my blood sugar before meals     Care Management & Community Referrals:  Referral 01/16/2014  Referrals made for care management support none needed  Referrals made to community resources -

## 2014-01-16 NOTE — Assessment & Plan Note (Signed)
The patient notes right ear pain, with physical exam showing erythema of the external auditory canal, consistent with otitis externa. -will treat with ciprodex BID x7 days

## 2014-01-16 NOTE — Progress Notes (Signed)
INTERNAL MEDICINE TEACHING ATTENDING ADDENDUM - Aldine Contes, MD: I reviewed and discussed with the resident Dr. Owens Shark, the patient's medical history, physical examination, diagnosis and results of pertinent tests and treatment and I agree with the patient's care as documented.

## 2014-01-16 NOTE — Addendum Note (Signed)
Addended by: Hester Mates on: 01/16/2014 10:07 AM   Modules accepted: Orders, Medications

## 2014-01-16 NOTE — Assessment & Plan Note (Signed)
The patient notes progressively worsening vision, reportedly seeing 2 ophthalmologists in the last 3 weeks.  We will attempt to obtain records to get a better idea about what is affecting his vision.  His description makes it sound like worsening of diabetic retinopathy, though I would like confirmation via records -will call for records

## 2014-01-16 NOTE — Assessment & Plan Note (Signed)
BP Readings from Last 3 Encounters:  01/16/14 138/87  12/04/13 177/102  10/30/13 196/122    Lab Results  Component Value Date   NA 138 12/02/2013   K 4.3 12/02/2013   CREATININE 1.27 12/02/2013    Assessment: Blood pressure control: controlled Progress toward BP goal:  at goal Comments: BP has significantly improved since hospitalization, and pt brings all of his med bottles in today.  Home readings are somewhat higher than the readings here in clinic, but this may represent poor calibration of his home cuff vs taking his BP before taking his anti-HTN medications.  Plan: Medications:  Continue amlodipine 10, coreg 25 BID, irbesartan 300 mg daily, hydralazine 100 mg TID Educational resources provided: brochure Self management tools provided:   Other plans: Reassess at next visit

## 2014-01-16 NOTE — Assessment & Plan Note (Signed)
The patient notes a 46-month history of anhedonia, increased sleep, poor concentration.  We will start the patient on a trial of an SSRI, and assess response. -start Fluoxetine 20 mg daily

## 2014-01-16 NOTE — Progress Notes (Signed)
HPI The patient is a 48 y.o. male with a history of HTN, DM2, lumbar stenosis, presenting for a hospital follow-up visit for HTN emergency.  The patient has a history of HTN, recently hospitalized 3/31-4/6 for HTN emergency and dCHF exacerbation.  BP is well-controlled today.  However, he brings his home BP cuff, which shows some values in the 130/80's, though some as high as 170/90's.  He states he often takes his BP in the morning before taking his BP medications.  The patient has a history of dCHF.  He notes no LE edema, orthopnea, or PND presently.  Pt notes symptoms of depression, and asks if he can get a medication for depression.  He notes for the last 7 months symptoms of depressed mood, sleeping more hours than usual sometimes "staying in bed all day", poor concentration, lack of interest in previously enjoyed activities such as walking his dog.  No change in appetite, feelings of guilt, or wanting to harm himself or others.  The patient notes a 3-week history of poor vision.  He followed up with both of his Ophthalmologists, who reportedly told him he had a "stroke" in one of his eyes.  He also has a history of diabetic retinopathy, s/p laster phototherapy.  The patient notes no change in vision since his appointment with their offices, and no facial pain.  The patient also notes a 2-week history of right ear pain, described as a tenderness to his ear, particularly when his fan at home blows in his ear.  He notes no discharge, change in hearing, rhinorrhea, sinus pain, headache, or cough.  Pt requests refill of lyrica.  ROS: General: no fevers, chills, changes in weight, changes in appetite Skin: no rash HEENT: see HPI Pulm: no dyspnea, coughing, wheezing CV: no chest pain, palpitations, shortness of breath Abd: no abdominal pain, nausea/vomiting, diarrhea/constipation GU: no dysuria, hematuria, polyuria Ext: no arthralgias, myalgias Neuro: +lumbar radiculopathy  Filed Vitals:   01/16/14 0844  BP: 138/87  Pulse: 82  Temp: 99.3 F (37.4 C)    PEX General: alert, cooperative, and in no apparent distress HEENT: PERRL, EOMI, oropharynx non-erythematous, right external auditory canal with mild erythema, without exudate.  Bilateral tympanic membranes clear and unremarkable. Neck: supple Lungs: clear to ascultation bilaterally, normal work of respiration, no wheezes, rales, ronchi Heart: regular rate and rhythm, no murmurs, gallops, or rubs Abdomen: soft, non-tender, non-distended, normal bowel sounds Extremities: no cyanosis, clubbing, or edema Neurologic: alert & oriented X3, cranial nerves II-XII intact, strength grossly intact, sensation intact to light touch  Current Outpatient Prescriptions on File Prior to Visit  Medication Sig Dispense Refill  . albuterol (PROVENTIL HFA;VENTOLIN HFA) 108 (90 BASE) MCG/ACT inhaler Inhale 2 puffs into the lungs every 4 (four) hours as needed for wheezing or shortness of breath.  1 Inhaler  6  . amLODipine (NORVASC) 10 MG tablet Take 1 tablet (10 mg total) by mouth daily.  30 tablet  3  . aspirin 81 MG chewable tablet Chew 1 tablet (81 mg total) by mouth daily.  30 tablet  11  . carvedilol (COREG) 25 MG tablet Take 25 mg by mouth 2 (two) times daily with a meal.      . furosemide (LASIX) 40 MG tablet Take 1 tablet (40 mg total) by mouth daily.  30 tablet  3  . hydrALAZINE (APRESOLINE) 100 MG tablet Take 1 tablet (100 mg total) by mouth 3 (three) times daily.  90 tablet  3  . insulin aspart (NOVOLOG)  100 UNIT/ML injection Inject 4-8 Units into the skin 3 (three) times daily before meals.  10 mL  11  . insulin glargine (LANTUS) 100 UNIT/ML injection Inject 0.25 mLs (25 Units total) into the skin 2 (two) times daily.  10 mL  11  . irbesartan (AVAPRO) 300 MG tablet Take 1 tablet (300 mg total) by mouth daily.  30 tablet  2   No current facility-administered medications on file prior to visit.    Assessment/Plan

## 2014-02-15 ENCOUNTER — Encounter: Payer: Self-pay | Admitting: Internal Medicine

## 2014-02-21 ENCOUNTER — Other Ambulatory Visit: Payer: Self-pay | Admitting: Internal Medicine

## 2014-02-21 NOTE — Telephone Encounter (Signed)
He is actually not on any of the three medications. Thanks!

## 2014-03-23 ENCOUNTER — Other Ambulatory Visit: Payer: Self-pay | Admitting: Internal Medicine

## 2014-03-26 NOTE — Telephone Encounter (Signed)
Talked with pt and he was given Lantus but ran out so changed back to NPH.  He would like a Rx sent in for Lantus and he will stay on that. Also he prefers to stay on Novolog as it works better for him than Regular insulin.  He ran out over the weekend and had to buy a bottle himself.  So please refill Lantus & Novolog and ask pharmacy to d/c all other insulins.

## 2014-03-26 NOTE — Telephone Encounter (Signed)
Is he taking this? Per the chart he is on aspart sliding scale. Will you call him and find out, please?

## 2014-03-26 NOTE — Telephone Encounter (Signed)
Refill of insulin clarification:    Pharmacy called for clarification. They have 3 insulins listed  1 - Regular insulin take twice a day with largest meals based on sliding scale 1 - 18 units    Last filled 6/24    Written 04/20/13 with 3 refills.                                                                                                    2 - Novolin N  35 bid into the skin twice a day   Last filled    6/24                                                                                                    3 - Novolog 4 - 8 units 4 time a day before meads.  Last filled 03/23/14  Not sure what is correct.  MAR has pt on Novolog & Lantus

## 2014-03-28 NOTE — Telephone Encounter (Signed)
The Lantus and Novolog were filled in April with 11 refills, so there should be refills at the pharmacy. Please ask him to contact his pharmacy to have the prescriptions refilled.

## 2014-03-29 NOTE — Telephone Encounter (Signed)
Pharmacy called and corrected they med list.  Pt now on Lantus and Novolog with 9 refills on both.

## 2014-04-23 ENCOUNTER — Encounter: Payer: Medicaid Other | Attending: Internal Medicine | Admitting: *Deleted

## 2014-04-23 ENCOUNTER — Encounter: Payer: Self-pay | Admitting: *Deleted

## 2014-04-23 VITALS — Ht 73.0 in | Wt 302.4 lb

## 2014-04-23 DIAGNOSIS — IMO0001 Reserved for inherently not codable concepts without codable children: Secondary | ICD-10-CM | POA: Insufficient documentation

## 2014-04-23 DIAGNOSIS — Z794 Long term (current) use of insulin: Secondary | ICD-10-CM

## 2014-04-23 DIAGNOSIS — IMO0002 Reserved for concepts with insufficient information to code with codable children: Secondary | ICD-10-CM

## 2014-04-23 DIAGNOSIS — E1165 Type 2 diabetes mellitus with hyperglycemia: Secondary | ICD-10-CM

## 2014-04-23 DIAGNOSIS — Z713 Dietary counseling and surveillance: Secondary | ICD-10-CM | POA: Insufficient documentation

## 2014-04-23 NOTE — Patient Instructions (Addendum)
Plan:  Call [facility] to talk to them about your depression [phone]) Ask MD for referral for Home Health to help you with your medication management and other resources available to you I believe you would benefit from use of Insulin Pens and use the click noise to measure the doses and magnifying glass to verify the dose Consider lower sodium meats or peanut butter for your sandwiches  Consider fresh or frozen vegetables instead of canned foods to reduce sodium intake

## 2014-04-23 NOTE — Progress Notes (Signed)
Appt start time: 0900 end time:  1000.  Assessment:  Patient was seen on  04/23/14 for individual diabetes education. Here to see if insulin pump may be an option. He lives with a girl friend, she works 2 jobs from 8 AM to 1 AM daily. History of DM for over 20 years, now has retinopathy with visual impairment. Complains of depression with his recent loss of sight and loss of driving ability. He has been visited by [facility] and they have provided some appliance tools to help; magnifying glass and special clock. He has difficulty chewing now, he has no teeth. He enjoys playing with his dog, a [employer] assistant, radio that just had puppies. He SMBG about 2 times a day with reported large variability in BG during the day. He states he has no understanding of Carb Counting.  Patient Education Plan per assessed needs and concerns is to attend individual session for Diabetes Self Management Education.  Current HbA1c: 9.7%  Preferred Learning Style:   No preference indicated   Learning Readiness:   Contemplating  MEDICATIONS: see list, diabetes medications are Lantus and Humalog via vials and syringe, which he states he has difficulty seeing, so he gives 10 units arbitrarily.  DIETARY INTAKE:  24-hr recall:  B (8-12 AM):coffee with Splenda and evap milk occasionally Snk ( AM): no  L ( PM): 2 sandwiches with ham or tuna, a handful of chips,  Snk ( PM): fresh fruit D ( PM): 2 sandwiches again, occasionally he will cook pasta and sauce OR grilled meat with noodles and vegetables,  Snk ( PM): usually something sweet, Debbie cake or sweet cereal & 2% milk Beverages: Crystal Light flavored water  Usual physical activity: limited due to visual impairment and poorly controlled diabetes  Estimated energy needs: 2000 calories 225 g carbohydrates 150 g protein 56 g fat  Progress Towards Goal(s):  In progress.   Nutritional Diagnosis:  NB-1.1 Food and nutrition-related knowledge deficit  As related to diabetes.  As evidenced by A1c of 9.7%.    Intervention:  Discussed with him the opportunity of calling [facility] for support for his depression.   Discussed the option of switching from vials and syringes to pens so he could hear the clicks of the pen for more accurate delivery of insulin.Demonstrated use of insulin pen and he acknowledged that he could hear the clicks and was able to draw up desired units.  Taught difference between delivery of insulin via syringe/pen compared to insulin pump.  Demonstrated improved insulin delivery via pump due to improved accuracy of dose and flexibility of adjusting bolus insulin based on carb intake and BG correction.  He will need more instruction on Carb Counting and basic Diabetes Education before pursuing the insulin pump. Plan follow up ASAP for Carb Counting and will provide demo of various insulin pumps at that time so we can determine with his visual impairment, which he will be able to learn to use.   Plan:  Call [facility] to talk to them about your depression [phone]) Ask MD for referral for Home Health to help you with your medication management and other resources available to you I believe you would benefit from use of Insulin Pens and use the click noise to measure the doses and magnifying glass to verify the dose Consider lower sodium meats or peanut butter for your sandwiches  Consider fresh or frozen vegetables instead of canned foods to reduce sodium intake   Teaching Method  Utilized: Environmental health practitioner, Auditory and Hands on  Handouts given during visit include:  Card for [facility]  Intro to Pumping handout  Barriers to learning/adherence to lifestyle change: visual impairment and apparent depression   Diabetes self-care support plan:   [facility] support group available  [facility]  Demonstrated degree of understanding via:  Teach Back    Monitoring/Evaluation:  Dietary intake, exercise, and body weight in 1 month(s).

## 2014-05-23 ENCOUNTER — Other Ambulatory Visit: Payer: Self-pay | Admitting: Internal Medicine

## 2014-05-25 ENCOUNTER — Ambulatory Visit: Payer: Self-pay | Admitting: *Deleted

## 2014-05-28 ENCOUNTER — Encounter: Payer: Medicaid Other | Attending: Internal Medicine | Admitting: *Deleted

## 2014-05-28 VITALS — Ht 73.0 in | Wt 314.9 lb

## 2014-05-28 DIAGNOSIS — E1165 Type 2 diabetes mellitus with hyperglycemia: Principal | ICD-10-CM

## 2014-05-28 DIAGNOSIS — E119 Type 2 diabetes mellitus without complications: Secondary | ICD-10-CM

## 2014-05-28 DIAGNOSIS — IMO0001 Reserved for inherently not codable concepts without codable children: Secondary | ICD-10-CM | POA: Diagnosis present

## 2014-05-28 DIAGNOSIS — Z713 Dietary counseling and surveillance: Secondary | ICD-10-CM | POA: Insufficient documentation

## 2014-05-28 NOTE — Progress Notes (Signed)
Appt start time: 0900 end time:  1000.  Assessment:  Patient was seen on  05/28/14 for individual diabetes education. He stated at outset of this appointment that he did NOT follow up with the [facility] as suggested at our last visit. He called them once but did not follow up after that. He feels that he is doing better and that God will take care of him. He does appear more content today. He has not been back to his PCP since our visit so there has been no progress with the idea of Home Health assistance either. He states he is taking his insulin; Lantus @ 30 units BID as best as he can see the syringe and he takes 10 units of Novolog periodically too.  Patient Education Plan per assessed needs and concerns is to attend individual session for Diabetes Self Management Education.  Current HbA1c: 9.7%  Preferred Learning Style:   No preference indicated   Learning Readiness:   Contemplating  MEDICATIONS: see list, diabetes medications are Lantus and Humalog via vials and syringe, which he states he has difficulty seeing, so he gives 10 units arbitrarily.  DIETARY INTAKE:  24-hr recall:  B (8-12 AM):coffee with Splenda and evap milk occasionally Snk ( AM): no  L ( PM): 2 sandwiches with ham or tuna, a handful of chips,  Snk ( PM): fresh fruit D ( PM): 2 sandwiches again, occasionally he will cook pasta and sauce OR grilled meat with noodles and vegetables,  Snk ( PM): usually something sweet, Debbie cake or sweet cereal & 2% milk Beverages: Crystal Light flavored water  Usual physical activity: limited due to visual impairment and poorly controlled diabetes  Estimated energy needs: 2000 calories 225 g carbohydrates 150 g protein 56 g fat  Progress Towards Goal(s):  In progress.   Nutritional Diagnosis:  NB-1.1 Food and nutrition-related knowledge deficit As related to diabetes.  As evidenced by A1c of 9.7%.    Intervention:  counseling and diabetes education  continued. Discussed Carb Counting as method of portion control with use of food models due to his significant visual impairment. He expressed very good understanding of Carb Counting based on food groups!   Reviewed the option of switching from vials and syringes to pens so he could hear the clicks of the pen for more accurate delivery of insulin.Demonstrated use of insulin pen and he acknowledged that he could hear the clicks and was able to draw up desired units.  I do not feel he would benefit from an insulin pump, I feel the insulin pens will take care of the vision deficit in terms of him getting the accurate doses needed.  Plan:  Aim for 4 Carb Choices per meal (60 grams) +/- 1 either way  Aim for 0-2 Carbs per snack if hungry  Include protein in moderation with your meals and snacks Consider taking 2 units for every Carb Choice which will average the 8 units per meal that has been prescribed Consider checking BG at alternate times per day as directed by MD Ask MD for referral for Home Health to help you with your medication management and other resources available to you I believe you would benefit from use of Insulin Pens and use the click noise to measure the doses and magnifying glass to verify the dose   Teaching Method Utilized: Visual, Auditory and Hands on  Handouts given during visit include:  Enlarged version of Yellow Meal Plan Card  Barriers to learning/adherence to lifestyle  change: visual impairment and apparent depression   Diabetes self-care support plan:   [facility] support group available  Demonstrated degree of understanding via:  Teach Back   Monitoring/Evaluation:  Dietary intake, exercise, and body weight in 1 month(s).

## 2014-05-28 NOTE — Patient Instructions (Signed)
Plan:  Aim for 4 Carb Choices per meal (60 grams) +/- 1 either way  Aim for 0-2 Carbs per snack if hungry  Include protein in moderation with your meals and snacks Consider taking 2 units for every Carb Choice which will average the 8 units per meal that has been prescribed Consider checking BG at alternate times per day as directed by MD Ask MD for referral for Home Health to help you with your medication management and other resources available to you I believe you would benefit from use of Insulin Pens and use the click noise to measure the doses and magnifying glass to verify the dose

## 2014-06-26 ENCOUNTER — Emergency Department (HOSPITAL_COMMUNITY)
Admission: EM | Admit: 2014-06-26 | Discharge: 2014-06-26 | Disposition: A | Discharge status: Home / Self Care | Payer: Medicaid Other | Attending: Emergency Medicine | Admitting: Emergency Medicine

## 2014-06-26 ENCOUNTER — Emergency Department (HOSPITAL_COMMUNITY): Payer: Medicaid Other

## 2014-06-26 ENCOUNTER — Encounter (HOSPITAL_COMMUNITY): Payer: Self-pay | Admitting: Emergency Medicine

## 2014-06-26 DIAGNOSIS — E669 Obesity, unspecified: Secondary | ICD-10-CM | POA: Diagnosis not present

## 2014-06-26 DIAGNOSIS — Z87440 Personal history of urinary (tract) infections: Secondary | ICD-10-CM | POA: Diagnosis not present

## 2014-06-26 DIAGNOSIS — Z794 Long term (current) use of insulin: Secondary | ICD-10-CM | POA: Insufficient documentation

## 2014-06-26 DIAGNOSIS — E1140 Type 2 diabetes mellitus with diabetic neuropathy, unspecified: Secondary | ICD-10-CM | POA: Insufficient documentation

## 2014-06-26 DIAGNOSIS — I10 Essential (primary) hypertension: Secondary | ICD-10-CM | POA: Diagnosis not present

## 2014-06-26 DIAGNOSIS — Z79899 Other long term (current) drug therapy: Secondary | ICD-10-CM | POA: Diagnosis not present

## 2014-06-26 DIAGNOSIS — Z7982 Long term (current) use of aspirin: Secondary | ICD-10-CM | POA: Insufficient documentation

## 2014-06-26 DIAGNOSIS — R0789 Other chest pain: Secondary | ICD-10-CM | POA: Diagnosis not present

## 2014-06-26 DIAGNOSIS — Z720 Tobacco use: Secondary | ICD-10-CM | POA: Diagnosis not present

## 2014-06-26 DIAGNOSIS — I5031 Acute diastolic (congestive) heart failure: Secondary | ICD-10-CM | POA: Insufficient documentation

## 2014-06-26 DIAGNOSIS — M7989 Other specified soft tissue disorders: Secondary | ICD-10-CM | POA: Insufficient documentation

## 2014-06-26 DIAGNOSIS — R0602 Shortness of breath: Secondary | ICD-10-CM | POA: Insufficient documentation

## 2014-06-26 DIAGNOSIS — Z8701 Personal history of pneumonia (recurrent): Secondary | ICD-10-CM | POA: Insufficient documentation

## 2014-06-26 DIAGNOSIS — H539 Unspecified visual disturbance: Secondary | ICD-10-CM | POA: Diagnosis not present

## 2014-06-26 DIAGNOSIS — E785 Hyperlipidemia, unspecified: Secondary | ICD-10-CM | POA: Diagnosis not present

## 2014-06-26 HISTORY — DX: Heart failure, unspecified: I50.9

## 2014-06-26 HISTORY — DX: Obesity, unspecified: E66.9

## 2014-06-26 LAB — I-STAT TROPONIN, ED: Troponin i, poc: 0.01 ng/mL (ref 0.00–0.08)

## 2014-06-26 LAB — COMPREHENSIVE METABOLIC PANEL
ALT: 12 U/L (ref 0–53)
AST: 16 U/L (ref 0–37)
Albumin: 2.9 g/dL — ABNORMAL LOW (ref 3.5–5.2)
Alkaline Phosphatase: 91 U/L (ref 39–117)
Anion gap: 13 (ref 5–15)
BUN: 21 mg/dL (ref 6–23)
CHLORIDE: 100 meq/L (ref 96–112)
CO2: 28 mEq/L (ref 19–32)
Calcium: 9.2 mg/dL (ref 8.4–10.5)
Creatinine, Ser: 1.58 mg/dL — ABNORMAL HIGH (ref 0.50–1.35)
GFR calc Af Amer: 58 mL/min — ABNORMAL LOW (ref >90)
GFR, EST NON AFRICAN AMERICAN: 50 mL/min — AB (ref >90)
GLUCOSE: 296 mg/dL — AB (ref 70–99)
Potassium: 4.3 mEq/L (ref 3.7–5.3)
Sodium: 141 mEq/L (ref 137–147)
Total Bilirubin: <0.2 mg/dL — ABNORMAL LOW (ref 0.3–1.2)
Total Protein: 6.6 g/dL (ref 6.0–8.3)

## 2014-06-26 LAB — CBC WITH DIFFERENTIAL/PLATELET
Basophils Absolute: 0 10*3/uL (ref 0.0–0.1)
Basophils Relative: 0 % (ref 0–1)
EOS ABS: 0.1 10*3/uL (ref 0.0–0.7)
EOS PCT: 2 % (ref 0–5)
HCT: 47 % (ref 39.0–52.0)
HEMOGLOBIN: 15.2 g/dL (ref 13.0–17.0)
LYMPHS ABS: 1.7 10*3/uL (ref 0.7–4.0)
Lymphocytes Relative: 24 % (ref 12–46)
MCH: 30.1 pg (ref 26.0–34.0)
MCHC: 32.3 g/dL (ref 30.0–36.0)
MCV: 93.1 fL (ref 78.0–100.0)
MONOS PCT: 6 % (ref 3–12)
Monocytes Absolute: 0.5 10*3/uL (ref 0.1–1.0)
Neutro Abs: 4.9 10*3/uL (ref 1.7–7.7)
Neutrophils Relative %: 68 % (ref 43–77)
Platelets: 175 10*3/uL (ref 150–400)
RBC: 5.05 MIL/uL (ref 4.22–5.81)
RDW: 13.1 % (ref 11.5–15.5)
WBC: 7.2 10*3/uL (ref 4.0–10.5)

## 2014-06-26 LAB — PRO B NATRIURETIC PEPTIDE: Pro B Natriuretic peptide (BNP): 451.5 pg/mL — ABNORMAL HIGH (ref 0–125)

## 2014-06-26 MED ORDER — HYDRALAZINE HCL 100 MG PO TABS: 100.0000 mg | ORAL_TABLET | Freq: Three times a day (TID) | ORAL | Status: DC

## 2014-06-26 MED ORDER — ALBUTEROL SULFATE HFA 108 (90 BASE) MCG/ACT IN AERS
2.0000 | INHALATION_SPRAY | Freq: Once | RESPIRATORY_TRACT | Status: AC
  Administered 2014-06-26: 2 via RESPIRATORY_TRACT
  Filled 2014-06-26: qty 6.7

## 2014-06-26 MED ORDER — IPRATROPIUM BROMIDE 0.02 % IN SOLN: 0.5000 mg | Freq: Four times a day (QID) | RESPIRATORY_TRACT | Status: DC

## 2014-06-26 MED ORDER — INSULIN GLARGINE 100 UNIT/ML ~~LOC~~ SOLN: 35.0000 [IU] | Freq: Two times a day (BID) | SUBCUTANEOUS | Status: DC

## 2014-06-26 MED ORDER — CARVEDILOL 25 MG PO TABS: 25.0000 mg | ORAL_TABLET | Freq: Two times a day (BID) | ORAL | Status: DC

## 2014-06-26 MED ORDER — AMLODIPINE BESYLATE 10 MG PO TABS: 10.0000 mg | ORAL_TABLET | Freq: Every day | ORAL | Status: DC

## 2014-06-26 MED ORDER — FUROSEMIDE 80 MG PO TABS: 80.0000 mg | ORAL_TABLET | Freq: Every day | ORAL | Status: DC | PRN

## 2014-06-26 MED ORDER — METOLAZONE 5 MG PO TABS: 5.0000 mg | ORAL_TABLET | Freq: Every day | ORAL | Status: DC

## 2014-06-26 MED ORDER — HYDRALAZINE HCL 20 MG/ML IJ SOLN
10.0000 mg | INTRAMUSCULAR | Status: AC
  Administered 2014-06-26: 10 mg via INTRAVENOUS
  Filled 2014-06-26: qty 1

## 2014-06-26 MED ORDER — VERAPAMIL HCL ER 120 MG PO TBCR: 120.0000 mg | EXTENDED_RELEASE_TABLET | Freq: Every day | ORAL | Status: DC

## 2014-06-26 MED ORDER — INSULIN ASPART 100 UNIT/ML ~~LOC~~ SOLN
10.0000 [IU] | Freq: Three times a day (TID) | SUBCUTANEOUS | Status: DC
Start: 2014-06-26 — End: 2015-02-05

## 2014-06-26 MED ORDER — FUROSEMIDE 10 MG/ML IJ SOLN
80.0000 mg | Freq: Once | INTRAMUSCULAR | Status: AC
  Administered 2014-06-26: 80 mg via INTRAVENOUS
  Filled 2014-06-26: qty 8

## 2014-06-26 NOTE — ED Notes (Addendum)
Pt reports having hx of chf, having sob, swelling to extremities and productive cough. Airway intact spo2 99% and ekg done at triage.

## 2014-06-26 NOTE — ED Notes (Signed)
Called will add on cbc as ordered

## 2014-06-26 NOTE — ED Notes (Signed)
Patient returned from X-ray 

## 2014-06-26 NOTE — ED Provider Notes (Signed)
Patient seen and evaluated. Initially reports compliance with his medications. However, he states that he's been out of his "blood pressure medicines" for the last several days. A lot of his Lasix for several days because he had doubled them. Complains mostly of swelling in his legs and decreased exertional capacity. No chest pain. On exam he shows some diminished breath sounds. No crackles. No JVD. BNP not elevated, and no CHF on x-ray. Show signs of right heart failure dependent edema, but no left-sided congestive heart failure. He has a greater than 50 accurate smoking history. His chart reflects that he has been prescribed albuterol inhaler in the past, however he states he does not use one. Strongly encouraged him to stop smoking. Will prescribe Combivent. I told him we will refill his medications for the next week. Told in no uncertain terms he needs to be compliant C his primary care physician. Requested that he have recheck of his renal function within the next week. We will add Zaroxolyn prior to his morning Lasix dose.  Tanna Furry, MD 06/26/14 [phone]

## 2014-06-26 NOTE — ED Provider Notes (Signed)
CSN: [id number]     Arrival date & time 06/26/14  [id number] History   First MD Initiated Contact with Patient 06/26/14 [phone]     Chief Complaint  Patient presents with  . Shortness of Breath     (Consider location/radiation/quality/duration/timing/severity/associated sxs/prior Treatment) HPI Jerry Cantrell is a 48 y.o. male history of hypertension, hyperlipidemia, diabetes,dCHF, obesity comes in for evaluation for shortness of breath, chest tightness, fluid overload. Patient states he has difficulty getting to his primary care doctor and wants to be admitted in order to get all of his "problems under control". He reports he has had shortness of breath for the past 2 months that has gotten worse over the past 2 weeks. He reports chest tightness that has occurred intermittently over the past month and a half. Chest tightness is localized to the lower sternum and left chest, comes on at rest him it does not radiate and lasts for 30 seconds then resolves. Patient also reports increased swelling in his legs and hands over the past few months. States he takes his fluid pills but he can't keep the swelling down. Reports due to his diabetic retinopathy he is blind and unable to drive to his appointments and has difficulty finding transportation. Current half pack a day smoker Denies fevers, abdominal pain, numbness or weakness, syncope. No recent travel, surgeries, exogenous estrogen, cough, no history of DVT. Past Medical History  Diagnosis Date  . Hypertension   . Hyperlipidemia   . Meralgia paraesthetica 05/03/2012  . Lumbar spondylosis 05/03/2012  . Lumbar spinal stenosis 05/03/2012    Mild with only right L4 nerve root encroachment, no neurogenic claudication   . Urinary incontinence 05/12/2012    Since the start of Sept. 2013   . Hemorrhoids 05/12/2012  . UTI (lower urinary tract infection)   . Meralgia paraesthetica 05/03/2012    On Lyrica which does improve pain.   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  Substance abuse   . Hyperlipidemia   . Pneumonia   . Diabetes mellitus   . 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 Acute diastolic heart failure   . Retinopathy   . CHF (congestive heart failure)   . Obesity    Past Surgical History  Procedure Laterality Date  . Tonsillectomy    . Abdominal surgery      Abscess I&D 2/2 infected hair  . Colonoscopy w/ polypectomy      pt to bring records   Family History  Problem Relation Age of Onset  . Prostate cancer Father   . Alcohol abuse Father   . Emphysema Father     smoked   History  Substance Use Topics  . Smoking status: Current Every Day Smoker -- 0.40 packs/day for 30 years    Types: Cigarettes  . Smokeless tobacco: Never Used     Comment: "Trying - hard to stop"  . Alcohol Use: 3.6 oz/week    6 Cans of beer per week     Comment: beer    Review of Systems  Constitutional: Negative for fever.  HENT: Negative for sore throat.   Eyes: Positive for visual disturbance.  Respiratory: Positive for chest tightness and shortness of breath.   Cardiovascular: Positive for leg swelling.  Gastrointestinal: Negative for abdominal pain.  Endocrine: Negative for polyuria.  Genitourinary: Negative for dysuria.  Skin: Negative for rash.  Neurological: Negative for headaches.      Allergies  Review of patient's allergies indicates no known allergies.  Home Medications  Prior to Admission medications   Medication Sig Start Date End Date Taking? Authorizing Provider  albuterol (PROVENTIL HFA;VENTOLIN HFA) 108 (90 BASE) MCG/ACT inhaler Inhale 2 puffs into the lungs every 4 (four) hours as needed for wheezing or shortness of breath. 06/16/13   Otho Bellows, MD  amLODipine (NORVASC) 10 MG tablet Take 1 tablet (10 mg total) by mouth daily. 01/16/14   Hester Mates, MD  aspirin 81 MG chewable tablet Chew 1 tablet (81 mg total) by mouth daily. 01/30/13   Cresenciano Genre, MD  carvedilol (COREG) 25 MG tablet Take 25 mg by mouth 2  (two) times daily with a meal.    Historical Provider, MD  FLUoxetine (PROZAC) 20 MG capsule Take 1 capsule (20 mg total) by mouth daily. 01/16/14 01/16/15  Hester Mates, MD  furosemide (LASIX) 40 MG tablet Take 1 tablet (40 mg total) by mouth daily. 12/04/13   Rebecca Eaton, MD  hydrALAZINE (APRESOLINE) 100 MG tablet Take 1 tablet (100 mg total) by mouth 3 (three) times daily. 12/04/13   Rebecca Eaton, MD  insulin aspart (NOVOLOG) 100 UNIT/ML injection Inject 4-8 Units into the skin 3 (three) times daily before meals.    Otho Bellows, MD  insulin glargine (LANTUS) 100 UNIT/ML injection Inject 0.25 mLs (25 Units total) into the skin 2 (two) times daily.    Otho Bellows, MD  irbesartan (AVAPRO) 300 MG tablet Take 1 tablet (300 mg total) by mouth daily. 12/05/13   Rebecca Eaton, MD  lovastatin (MEVACOR) 20 MG tablet TAKE TWO TABLETS BY MOUTH ONCE DAILY 05/23/14   Otho Bellows, MD  omega-3 acid ethyl esters (LOVAZA) 1 G capsule Take 1 g by mouth 2 (two) times daily.    Historical Provider, MD  pregabalin (LYRICA) 100 MG capsule Take 1 capsule (100 mg total) by mouth 2 (two) times daily. 01/16/14   Hester Mates, MD  verapamil (CALAN-SR) 120 MG CR tablet Take 120 mg by mouth at bedtime.    Historical Provider, MD   BP 161/92  Pulse 71  Temp(Src) 98.1 F (36.7 C) (Oral)  Resp 13  Ht 6\' 1"  (1.854 m)  SpO2 95% Physical Exam  Nursing note and vitals reviewed. Constitutional: He is oriented to person, place, and time. He appears well-developed and well-nourished.  HENT:  Head: Normocephalic and atraumatic.  Mouth/Throat: Oropharynx is clear and moist.  Eyes: Conjunctivae are normal. Pupils are equal, round, and reactive to light. Right eye exhibits no discharge. Left eye exhibits no discharge. No scleral icterus.  Neck: Neck supple.  Cardiovascular: Normal rate, regular rhythm and normal heart sounds.   Pulmonary/Chest: Effort normal and breath sounds normal. No respiratory distress. He has  no wheezes. He has no rales.  Abdominal: Soft. There is no tenderness.  Musculoskeletal: He exhibits no tenderness.  2+ pitting pretibial edema bilaterally. No erythema or overt warmth. No evidence of acute vascular compromise. Compartments soft. No obvious lesions, ulcers or rashes appreciated  Neurological: He is alert and oriented to person, place, and time.  Cranial Nerves II-XII grossly intact  Skin: Skin is warm and dry. No rash noted.  Psychiatric: He has a normal mood and affect.    ED Course  Procedures (including critical care time) Labs Review Labs Reviewed  COMPREHENSIVE METABOLIC PANEL  PRO B NATRIURETIC PEPTIDE  CBC WITH DIFFERENTIAL  Randolm Idol, ED    Imaging Review Dg Chest 2 View  06/26/2014   CLINICAL DATA:  Cough and shortness of  breath for 2 months  EXAM: CHEST  2 VIEW  COMPARISON:  December 01, 2013  FINDINGS: There is mild scarring in the left mid lung. There is no edema or consolidation. Heart is mildly enlarged with pulmonary vascularity within normal limits. No adenopathy. There is degenerative change in the thoracic spine.  IMPRESSION: Mild cardiomegaly, stable. Mild scarring left mid lung. No edema or consolidation.   Electronically Signed   By: Lowella Grip M.D.   On: 06/26/2014 09:29     EKG Interpretation None      MDM  Vitals stable - WNL -afebrile, blood pressure reduced in the ED Pt resting comfortably in ED. PE not concerning for acute or emergent pathology. No evidence of respiratory distress, shortness of breath or tachypnea on exam, oxygen saturation is 99%. Labwork--troponin negative Imaging--chest x-ray shows no acute cardiopulmonary pathology. Mild cardiomegaly. No evidence of edema or fluid overload. PERC neg. Pt has apparent non compliance issues. He does not keep his appointments with his family practice, he does not use prescribed medications, he doubles up on blood pressure medication  Will DC with prescriptions for 1 week of  his regular medications. Will add Zaroxolyn to assist Lasix response. Encouraged follow-up with primary care for further evaluation and management Discussed f/u with PCP and return precautions, pt very amenable to plan.  Prior to patient discharge, I discussed and reviewed this case with Dr.James    Final diagnoses:  SOB (shortness of breath)        Verl Dicker, PA-C 06/26/14 1115

## 2014-06-26 NOTE — Discharge Instructions (Signed)
Shortness of Breath Shortness of breath means you have trouble breathing. Shortness of breath needs medical care right away. HOME CARE   Do not smoke.  Avoid being around chemicals or things (paint fumes, dust) that may bother your breathing.  Rest as needed. Slowly begin your normal activities.  Only take medicines as told by your doctor.  Keep all doctor visits as told. GET HELP RIGHT AWAY IF:   Your shortness of breath gets worse.  You feel lightheaded, pass out (faint), or have a cough that is not helped by medicine.  You cough up blood.  You have pain with breathing.  You have pain in your chest, arms, shoulders, or belly (abdomen).  You have a fever.  You cannot walk up stairs or exercise the way you normally do.  You do not get better in the time expected.  You have a hard time doing normal activities even with rest.  You have problems with your medicines.  You have any new symptoms. MAKE SURE YOU:  Understand these instructions.  Will watch your condition.  Will get help right away if you are not doing well or get worse. Document Released: 02/03/2008 Document Revised: 08/22/2013 Document Reviewed: 11/02/2011 [facility] Patient Information 2015 [place], [place]. This information is not intended to replace advice given to you by your health care provider. Make sure you discuss any questions you have with your health care provider.    Emergency Department Resource Guide 1) Find a Doctor and Pay Out of Pocket Although you won't have to find out who is covered by your insurance plan, it is a good idea to ask around and get recommendations. You will then need to call the office and see if the doctor you have chosen will accept you as a new patient and what types of options they offer for patients who are self-pay. Some doctors offer discounts or will set up payment plans for their patients who do not have insurance, but you will need to ask so you aren't surprised  when you get to your appointment.  2) Contact Your Local Health Department Not all health departments have doctors that can see patients for sick visits, but many do, so it is worth a call to see if yours does. If you don't know where your local health department is, you can check in your phone book. The CDC also has a tool to help you locate your state's health department, and many state websites also have listings of all of their local health departments.  3) Find a [facility] If your illness is not likely to be very severe or complicated, you may want to try a walk in clinic. These are popping up all over the country in pharmacies, drugstores, and shopping centers. They're usually staffed by nurse practitioners or physician assistants that have been trained to treat common illnesses and complaints. They're usually fairly quick and inexpensive. However, if you have serious medical issues or chronic medical problems, these are probably not your best option.  No Primary Care Doctor: - Call Health Connect at  [phone] - they can help you locate a primary care doctor that  accepts your insurance, provides certain services, etc. - Physician Referral Service- [phone]  Chronic Pain Problems: Organization         Address  Phone   Notes  [facility]  [phone] Patients need to be referred by their primary care doctor.   Medication Assistance: Organization  Address  Phone   Notes  [facility] Medication [facility] [address] [place], [place] [address] [phone] --Must be a resident of [facility] -- Must have NO insurance coverage whatsoever (no Medicaid/ Medicare, etc.) -- The pt. MUST have a primary care doctor that directs their care regularly and follows them in the community   MedAssist  [phone]   [employer]  [phone]    Agencies that provide inexpensive medical care: Organization          Address  Phone   Notes  [place]  [phone]   [facility]    [phone]   [facility] [place], [place] [address] [phone]   [place] [address], [place] [phone]   Planned Parenthood    [phone]   [facility]    [phone]   [place] and [place] [address], [place] Phone:  [phone], Fax:  [phone] Hours of Operation:  9 am - 6 pm, M-F.  Also accepts Medicaid/Medicare and self-pay.  [facility] for [place] [address] [place], [address], [place] Phone: [phone], Fax: [phone]. Hours of Operation:  8:30 am - 5:30 pm, M-F.  Also accepts Medicaid and self-pay.  [facility] [address], [place] Phone: [phone]   [place], [place], [place] [phone], Ext. [phone] Mondays & Thursdays: 7-9 AM.  First 15 patients are seen on a first come, first serve basis.    [place] Providers:  Organization         Address  Phone   Notes  [facility] [address], [place] [phone] Also accepts self-pay patients.  [facility] [id number] [place], [place]  [phone]   [place], [address], [place] [phone]   [facility] [address], [place] [phone]   Lucianne Lei [address], [place]   [phone] Only accepts [place] Access [place] patients after they have their name applied to their card.   Self-Pay (no insurance) in [facility]:  Organization         Address  Phone   Notes  Sickle Cell Patients, [facility] Internal Medicine [place] [phone]   [facility] Urgent Care [place] [phone]    [facility] [place]  [place], [place], [place] [phone]   [facility]/Dr. Osei-Bonsu  [address], [place] or [address], [place] [phone] Phone number for both [place] and [place] locations is the same.  Urgent Medical and [facility] [address] [place] [phone]   [facility] [address] [place] or [address] [phone]   [facility] [address], [place] [phone], phone; [phone], fax Sees patients 1st and 3rd Saturday of every month.  Must not qualify for public or private insurance (i.e. Medicaid, Medicare, [place] Health Choice, Veterans' Benefits)  Household income should be no more than 200% of the poverty level The clinic cannot treat you if you are pregnant or think you  are pregnant  Sexually transmitted diseases are not treated at the clinic.    Dental Care: Organization         Address  Phone  Notes  [facility] Department of [facility] [place] [phone] Accepts children up to age 66 who are enrolled in [place] or [place]; pregnant women with a Medicaid card; and children who have applied for Medicaid or [place] Health Choice, but were declined, whose parents can pay a reduced fee at time of service.  [facility] Department of [facility]  [address], [place] [phone] Accepts children up to age 84 who are enrolled in [place] or [place]; pregnant women with a Medicaid card; and children who have applied for Medicaid or [place] Health Choice, but were declined, whose parents can pay a reduced fee at time of service.  [place] Adult Dental Access PROGRAM  [place] [phone] Patients are seen by appointment only. Walk-ins are not accepted. [place] will see patients 68  years of age and older. Monday - Tuesday (8am-5pm) Most Wednesdays (8:30-5pm) $30 per visit, cash only  [facility] Adult Dental Access PROGRAM  [address], [facility] [phone] Patients are seen by appointment only. Walk-ins are not accepted. [place] will see patients 45 years of age and older. One Wednesday Evening (Monthly: Volunteer Based).  $30 per visit, cash only  [place]  [phone] for adults; Children under age 38, call Graduate Pediatric Dentistry at [phone]. Children aged 18-14, please call [phone] to request a pediatric application.  Dental services are provided in all areas of dental care including fillings, crowns and bridges, complete and partial dentures, implants, gum treatment, root canals, and extractions. Preventive care is also provided. Treatment is provided to both adults and children. Patients are selected via a lottery and there is often a waiting list.   [facility] [address] [place]  [phone] [email or web address]   [facility] [address] [place], [place] [phone], Ext. [phone] Second and Fourth Thursday of each month, opens at 6:30 AM; Clinic ends at 9 AM.  Patients are seen on a first-come first-served basis, and a limited number are seen during each clinic.   [facility]  [address] [place], [place] [phone]   Eligibility Requirements You must have lived in [place], [place], or [place] counties for at least the last three months.   You cannot be eligible for state or federal sponsored [employer], including [employer], [place], or [employer].   You generally cannot be eligible for healthcare insurance through your employer.    How to apply: Eligibility screenings are held every Tuesday and Wednesday afternoon from 1:00 pm until 4:00 pm. You do not need an appointment for the interview!  [facility]  [address] [place], [place]   [place]  [place] Department  [place]  [phone]    Behavioral Health Resources in the Community: Intensive Outpatient Programs Organization         Address  Phone  Notes  [facility] [place]. [address], [place], [place] [phone]   [facility] [address], [place], [place]   ADS: Alcohol & Drug Svcs [address]  Dr, [place], [place]   [place] [address],  [place], [place] or [phone]   Substance Abuse Resources Organization         Address  Phone  Notes  Alcohol and Drug Services  [phone]   [place]  [phone]   [facility] [place]   Chinita Pester  [phone]   Residential & Outpatient Substance Abuse Program  [phone]   Psychological Services Organization         Address  Phone  Notes  [facility] [place] [address]  [place]  [phone]   [place] [address] [place] or [phone]    Mobile Crisis Teams Organization         Address  Phone  Notes  [facility], Mobile Crisis Care Unit  [phone]   [facility] Psychotherapeutic Services  [address] [place], [place]   [place] Levels [address], [place] [phone]    Self-Help/Support Groups Organization         Address  Phone             Notes  [place]. of [place] - variety of support groups  [place] Call for more information  Narcotics Anonymous (NA), [facility]aring [facility] [address], [employer] [place]  2 meetings at this location   Special educational needs teacher         Address  Phone  Notes  ASAP Residential Treatment [place],    [place]   [phone]   [facility]  [address], [place] [id number], [place], [place]   [place], [place] [phone] Admissions: 8am-3pm M-F  Incentives Substance [place] [address],    [place], [place] [phone]   [facility] [address] [place], [place], [place]   [facility] [address],  [place], [place]   [facility] - Intensive Outpatient [address], [place], [place]   [facility] ([place].) [place].,  [place], [place] [phone] or [phone]   [facility] Treatment Services (RTS) [address] [place], [place] Accepts Medicaid  [facility] [place] [address]  [place] [phone] Substance Abuse/Addiction Treatment   [facility] Organization         Address  Phone  Notes  [facility]  [phone]   Domenic Schwab, PhD [address] [place], [place]   [phone] or [phone]   [place] No Name [place], [place] [phone]   [facility] [address] [place], [place] [phone] Insurance/Medicaid/sponsorship through [employer] and Families [address]                                    [place], [place] [phone] [place] [address], [place] [phone]    Dr. Adele Schilder  [phone]   [facility] Dept. 1) [address] [place] 2) [place] 3)  [place] [address], [place] [phone]  [phone]   [place] [phone])  [phone] or [phone] (After Hours)    ° ° ° °

## 2014-06-26 NOTE — ED Notes (Signed)
Patient transported to X-ray 

## 2014-07-02 ENCOUNTER — Encounter: Payer: Medicaid Other | Attending: Internal Medicine | Admitting: *Deleted

## 2015-01-17 ENCOUNTER — Inpatient Hospital Stay (HOSPITAL_COMMUNITY)
Admission: EM | Admit: 2015-01-17 | Discharge: 2015-01-22 | DRG: 871 | Disposition: A | Discharge status: Home / Self Care | Payer: Medicaid Other | Attending: Internal Medicine | Admitting: Internal Medicine

## 2015-01-17 DIAGNOSIS — F329 Major depressive disorder, single episode, unspecified: Secondary | ICD-10-CM | POA: Diagnosis present

## 2015-01-17 DIAGNOSIS — R059 Cough, unspecified: Secondary | ICD-10-CM

## 2015-01-17 DIAGNOSIS — M47816 Spondylosis without myelopathy or radiculopathy, lumbar region: Secondary | ICD-10-CM | POA: Diagnosis present

## 2015-01-17 DIAGNOSIS — E860 Dehydration: Secondary | ICD-10-CM | POA: Diagnosis present

## 2015-01-17 DIAGNOSIS — H53452 Other localized visual field defect, left eye: Secondary | ICD-10-CM | POA: Diagnosis present

## 2015-01-17 DIAGNOSIS — N183 Chronic kidney disease, stage 3 (moderate): Secondary | ICD-10-CM | POA: Diagnosis present

## 2015-01-17 DIAGNOSIS — R739 Hyperglycemia, unspecified: Secondary | ICD-10-CM

## 2015-01-17 DIAGNOSIS — N289 Disorder of kidney and ureter, unspecified: Secondary | ICD-10-CM

## 2015-01-17 DIAGNOSIS — J449 Chronic obstructive pulmonary disease, unspecified: Secondary | ICD-10-CM | POA: Diagnosis present

## 2015-01-17 DIAGNOSIS — E1140 Type 2 diabetes mellitus with diabetic neuropathy, unspecified: Secondary | ICD-10-CM | POA: Diagnosis present

## 2015-01-17 DIAGNOSIS — A419 Sepsis, unspecified organism: Principal | ICD-10-CM | POA: Diagnosis present

## 2015-01-17 DIAGNOSIS — E1165 Type 2 diabetes mellitus with hyperglycemia: Secondary | ICD-10-CM | POA: Diagnosis present

## 2015-01-17 DIAGNOSIS — F17210 Nicotine dependence, cigarettes, uncomplicated: Secondary | ICD-10-CM | POA: Diagnosis present

## 2015-01-17 DIAGNOSIS — N179 Acute kidney failure, unspecified: Secondary | ICD-10-CM | POA: Diagnosis present

## 2015-01-17 DIAGNOSIS — E876 Hypokalemia: Secondary | ICD-10-CM | POA: Diagnosis present

## 2015-01-17 DIAGNOSIS — I130 Hypertensive heart and chronic kidney disease with heart failure and stage 1 through stage 4 chronic kidney disease, or unspecified chronic kidney disease: Secondary | ICD-10-CM | POA: Diagnosis present

## 2015-01-17 DIAGNOSIS — M545 Low back pain, unspecified: Secondary | ICD-10-CM | POA: Diagnosis present

## 2015-01-17 DIAGNOSIS — G9340 Encephalopathy, unspecified: Secondary | ICD-10-CM | POA: Diagnosis present

## 2015-01-17 DIAGNOSIS — J9600 Acute respiratory failure, unspecified whether with hypoxia or hypercapnia: Secondary | ICD-10-CM

## 2015-01-17 DIAGNOSIS — IMO0002 Reserved for concepts with insufficient information to code with codable children: Secondary | ICD-10-CM

## 2015-01-17 DIAGNOSIS — Z6838 Body mass index (BMI) 38.0-38.9, adult: Secondary | ICD-10-CM

## 2015-01-17 DIAGNOSIS — Z794 Long term (current) use of insulin: Secondary | ICD-10-CM

## 2015-01-17 DIAGNOSIS — R05 Cough: Secondary | ICD-10-CM

## 2015-01-17 DIAGNOSIS — G8929 Other chronic pain: Secondary | ICD-10-CM | POA: Diagnosis present

## 2015-01-17 DIAGNOSIS — J9622 Acute and chronic respiratory failure with hypercapnia: Secondary | ICD-10-CM | POA: Diagnosis present

## 2015-01-17 DIAGNOSIS — E8729 Other acidosis: Secondary | ICD-10-CM

## 2015-01-17 DIAGNOSIS — E1121 Type 2 diabetes mellitus with diabetic nephropathy: Secondary | ICD-10-CM | POA: Diagnosis present

## 2015-01-17 DIAGNOSIS — G4733 Obstructive sleep apnea (adult) (pediatric): Secondary | ICD-10-CM | POA: Diagnosis present

## 2015-01-17 DIAGNOSIS — M48061 Spinal stenosis, lumbar region without neurogenic claudication: Secondary | ICD-10-CM | POA: Diagnosis present

## 2015-01-17 DIAGNOSIS — E113599 Type 2 diabetes mellitus with proliferative diabetic retinopathy without macular edema, unspecified eye: Secondary | ICD-10-CM | POA: Diagnosis present

## 2015-01-17 DIAGNOSIS — E785 Hyperlipidemia, unspecified: Secondary | ICD-10-CM | POA: Diagnosis present

## 2015-01-17 DIAGNOSIS — I5030 Unspecified diastolic (congestive) heart failure: Secondary | ICD-10-CM

## 2015-01-17 DIAGNOSIS — J189 Pneumonia, unspecified organism: Secondary | ICD-10-CM | POA: Diagnosis present

## 2015-01-17 DIAGNOSIS — F32A Depression, unspecified: Secondary | ICD-10-CM | POA: Diagnosis present

## 2015-01-17 DIAGNOSIS — N189 Chronic kidney disease, unspecified: Secondary | ICD-10-CM | POA: Diagnosis present

## 2015-01-17 DIAGNOSIS — E11359 Type 2 diabetes mellitus with proliferative diabetic retinopathy without macular edema: Secondary | ICD-10-CM | POA: Diagnosis present

## 2015-01-17 DIAGNOSIS — R451 Restlessness and agitation: Secondary | ICD-10-CM | POA: Diagnosis not present

## 2015-01-17 DIAGNOSIS — I5032 Chronic diastolic (congestive) heart failure: Secondary | ICD-10-CM | POA: Diagnosis present

## 2015-01-17 DIAGNOSIS — E662 Morbid (severe) obesity with alveolar hypoventilation: Secondary | ICD-10-CM | POA: Diagnosis present

## 2015-01-17 DIAGNOSIS — Z79899 Other long term (current) drug therapy: Secondary | ICD-10-CM

## 2015-01-17 DIAGNOSIS — I10 Essential (primary) hypertension: Secondary | ICD-10-CM | POA: Diagnosis present

## 2015-01-17 DIAGNOSIS — J9621 Acute and chronic respiratory failure with hypoxia: Secondary | ICD-10-CM | POA: Diagnosis present

## 2015-01-17 DIAGNOSIS — I110 Hypertensive heart disease with heart failure: Secondary | ICD-10-CM | POA: Diagnosis present

## 2015-01-17 DIAGNOSIS — E872 Acidosis: Secondary | ICD-10-CM | POA: Diagnosis present

## 2015-01-17 NOTE — ED Notes (Signed)
Pt to ED from friend's home via [facility] c/o hyperglycemia, lethargy, and increased shortness of breath. Initial sats in the 70s with cyanosis around nose and lips; placed on oxygen via [place] on 6L, sats 91-92% for EMS. Pt alert and oriented at this time; is lethargic. CBG read high for EMS. Oxygen sats noted to be 76% on 6L; placed on 15L NRB sats 97%

## 2015-01-18 ENCOUNTER — Inpatient Hospital Stay (HOSPITAL_COMMUNITY): Payer: Medicaid Other

## 2015-01-18 ENCOUNTER — Emergency Department (HOSPITAL_COMMUNITY): Payer: Medicaid Other

## 2015-01-18 ENCOUNTER — Encounter (HOSPITAL_COMMUNITY): Payer: Self-pay | Admitting: *Deleted

## 2015-01-18 ENCOUNTER — Other Ambulatory Visit (HOSPITAL_COMMUNITY): Payer: Self-pay

## 2015-01-18 DIAGNOSIS — N183 Chronic kidney disease, stage 3 (moderate): Secondary | ICD-10-CM

## 2015-01-18 DIAGNOSIS — R079 Chest pain, unspecified: Secondary | ICD-10-CM

## 2015-01-18 DIAGNOSIS — R451 Restlessness and agitation: Secondary | ICD-10-CM | POA: Diagnosis not present

## 2015-01-18 DIAGNOSIS — E1140 Type 2 diabetes mellitus with diabetic neuropathy, unspecified: Secondary | ICD-10-CM | POA: Diagnosis present

## 2015-01-18 DIAGNOSIS — R739 Hyperglycemia, unspecified: Secondary | ICD-10-CM | POA: Insufficient documentation

## 2015-01-18 DIAGNOSIS — Z79899 Other long term (current) drug therapy: Secondary | ICD-10-CM | POA: Diagnosis not present

## 2015-01-18 DIAGNOSIS — Z794 Long term (current) use of insulin: Secondary | ICD-10-CM | POA: Diagnosis not present

## 2015-01-18 DIAGNOSIS — G9340 Encephalopathy, unspecified: Secondary | ICD-10-CM | POA: Diagnosis present

## 2015-01-18 DIAGNOSIS — I10 Essential (primary) hypertension: Secondary | ICD-10-CM

## 2015-01-18 DIAGNOSIS — I110 Hypertensive heart disease with heart failure: Secondary | ICD-10-CM | POA: Diagnosis not present

## 2015-01-18 DIAGNOSIS — Z6838 Body mass index (BMI) 38.0-38.9, adult: Secondary | ICD-10-CM | POA: Diagnosis not present

## 2015-01-18 DIAGNOSIS — J9622 Acute and chronic respiratory failure with hypercapnia: Secondary | ICD-10-CM | POA: Diagnosis present

## 2015-01-18 DIAGNOSIS — N189 Chronic kidney disease, unspecified: Secondary | ICD-10-CM | POA: Diagnosis present

## 2015-01-18 DIAGNOSIS — F17210 Nicotine dependence, cigarettes, uncomplicated: Secondary | ICD-10-CM | POA: Diagnosis present

## 2015-01-18 DIAGNOSIS — N289 Disorder of kidney and ureter, unspecified: Secondary | ICD-10-CM

## 2015-01-18 DIAGNOSIS — E872 Acidosis: Secondary | ICD-10-CM

## 2015-01-18 DIAGNOSIS — J189 Pneumonia, unspecified organism: Secondary | ICD-10-CM | POA: Diagnosis present

## 2015-01-18 DIAGNOSIS — A419 Sepsis, unspecified organism: Secondary | ICD-10-CM | POA: Diagnosis present

## 2015-01-18 DIAGNOSIS — J9602 Acute respiratory failure with hypercapnia: Secondary | ICD-10-CM | POA: Diagnosis not present

## 2015-01-18 DIAGNOSIS — G4733 Obstructive sleep apnea (adult) (pediatric): Secondary | ICD-10-CM

## 2015-01-18 DIAGNOSIS — I130 Hypertensive heart and chronic kidney disease with heart failure and stage 1 through stage 4 chronic kidney disease, or unspecified chronic kidney disease: Secondary | ICD-10-CM | POA: Diagnosis present

## 2015-01-18 DIAGNOSIS — J9621 Acute and chronic respiratory failure with hypoxia: Secondary | ICD-10-CM | POA: Diagnosis present

## 2015-01-18 DIAGNOSIS — E662 Morbid (severe) obesity with alveolar hypoventilation: Secondary | ICD-10-CM | POA: Diagnosis present

## 2015-01-18 DIAGNOSIS — M545 Low back pain: Secondary | ICD-10-CM | POA: Diagnosis present

## 2015-01-18 DIAGNOSIS — I5030 Unspecified diastolic (congestive) heart failure: Secondary | ICD-10-CM

## 2015-01-18 DIAGNOSIS — E785 Hyperlipidemia, unspecified: Secondary | ICD-10-CM | POA: Diagnosis present

## 2015-01-18 DIAGNOSIS — E876 Hypokalemia: Secondary | ICD-10-CM | POA: Diagnosis present

## 2015-01-18 DIAGNOSIS — G8929 Other chronic pain: Secondary | ICD-10-CM | POA: Diagnosis present

## 2015-01-18 DIAGNOSIS — E1121 Type 2 diabetes mellitus with diabetic nephropathy: Secondary | ICD-10-CM | POA: Diagnosis present

## 2015-01-18 DIAGNOSIS — I5032 Chronic diastolic (congestive) heart failure: Secondary | ICD-10-CM | POA: Diagnosis present

## 2015-01-18 DIAGNOSIS — J449 Chronic obstructive pulmonary disease, unspecified: Secondary | ICD-10-CM | POA: Diagnosis present

## 2015-01-18 DIAGNOSIS — Z720 Tobacco use: Secondary | ICD-10-CM | POA: Diagnosis not present

## 2015-01-18 DIAGNOSIS — E11359 Type 2 diabetes mellitus with proliferative diabetic retinopathy without macular edema: Secondary | ICD-10-CM | POA: Diagnosis present

## 2015-01-18 DIAGNOSIS — N179 Acute kidney failure, unspecified: Secondary | ICD-10-CM

## 2015-01-18 DIAGNOSIS — E1165 Type 2 diabetes mellitus with hyperglycemia: Secondary | ICD-10-CM | POA: Diagnosis present

## 2015-01-18 DIAGNOSIS — E8729 Other acidosis: Secondary | ICD-10-CM | POA: Insufficient documentation

## 2015-01-18 DIAGNOSIS — F329 Major depressive disorder, single episode, unspecified: Secondary | ICD-10-CM | POA: Diagnosis present

## 2015-01-18 DIAGNOSIS — E860 Dehydration: Secondary | ICD-10-CM | POA: Diagnosis present

## 2015-01-18 LAB — BASIC METABOLIC PANEL
ANION GAP: 5 (ref 5–15)
Anion gap: 13 (ref 5–15)
BUN: 76 mg/dL — AB (ref 6–20)
BUN: 70 mg/dL — ABNORMAL HIGH (ref 6–20)
CHLORIDE: 93 mmol/L — AB (ref 101–111)
CO2: 32 mmol/L (ref 22–32)
CO2: 31 mmol/L (ref 22–32)
Calcium: 8.8 mg/dL — ABNORMAL LOW (ref 8.9–10.3)
Calcium: 7.9 mg/dL — ABNORMAL LOW (ref 8.9–10.3)
Chloride: 95 mmol/L — ABNORMAL LOW (ref 101–111)
Creatinine, Ser: 3.33 mg/dL — ABNORMAL HIGH (ref 0.61–1.24)
Creatinine, Ser: 2.85 mg/dL — ABNORMAL HIGH (ref 0.61–1.24)
GFR calc Af Amer: 23 mL/min — ABNORMAL LOW (ref >60)
GFR calc non Af Amer: 20 mL/min — ABNORMAL LOW (ref >60)
GFR, EST AFRICAN AMERICAN: 28 mL/min — AB (ref >60)
GFR, EST NON AFRICAN AMERICAN: 24 mL/min — AB (ref >60)
Glucose, Bld: 243 mg/dL — ABNORMAL HIGH (ref 65–99)
Glucose, Bld: 193 mg/dL — ABNORMAL HIGH (ref 65–99)
POTASSIUM: 3.4 mmol/L — AB (ref 3.5–5.1)
Potassium: >7.5 mmol/L (ref 3.5–5.1)
SODIUM: 131 mmol/L — AB (ref 135–145)
Sodium: 138 mmol/L (ref 135–145)

## 2015-01-18 LAB — APTT: aPTT: 28 seconds (ref 24–37)

## 2015-01-18 LAB — LACTIC ACID, PLASMA
LACTIC ACID, VENOUS: 1.2 mmol/L (ref 0.5–2.0)
Lactic Acid, Venous: 0.8 mmol/L (ref 0.5–2.0)

## 2015-01-18 LAB — BASIC METABOLIC PANEL WITH GFR
Anion gap: 15 (ref 5–15)
BUN: 82 mg/dL — ABNORMAL HIGH (ref 6–20)
CO2: 34 mmol/L — ABNORMAL HIGH (ref 22–32)
Calcium: 9 mg/dL (ref 8.9–10.3)
Chloride: 82 mmol/L — ABNORMAL LOW (ref 101–111)
Creatinine, Ser: 3.59 mg/dL — ABNORMAL HIGH (ref 0.61–1.24)
GFR calc Af Amer: 21 mL/min — ABNORMAL LOW
GFR calc non Af Amer: 18 mL/min — ABNORMAL LOW
Glucose, Bld: 784 mg/dL (ref 65–99)
Potassium: 3.8 mmol/L (ref 3.5–5.1)
Sodium: 131 mmol/L — ABNORMAL LOW (ref 135–145)

## 2015-01-18 LAB — POCT I-STAT 3, ART BLOOD GAS (G3+)
ACID-BASE EXCESS: 5 mmol/L — AB (ref 0.0–2.0)
ACID-BASE EXCESS: 6 mmol/L — AB (ref 0.0–2.0)
Acid-Base Excess: 6 mmol/L — ABNORMAL HIGH (ref 0.0–2.0)
Acid-Base Excess: 6 mmol/L — ABNORMAL HIGH (ref 0.0–2.0)
BICARBONATE: 37.1 meq/L — AB (ref 20.0–24.0)
Bicarbonate: 35 mEq/L — ABNORMAL HIGH (ref 20.0–24.0)
Bicarbonate: 36.5 mEq/L — ABNORMAL HIGH (ref 20.0–24.0)
Bicarbonate: 38 mEq/L — ABNORMAL HIGH (ref 20.0–24.0)
O2 SAT: 89 %
O2 SAT: 91 %
O2 Saturation: 91 %
O2 Saturation: 86 %
PCO2 ART: 93.1 mmHg — AB (ref 35.0–45.0)
PCO2 ART: 81.1 mmHg — AB (ref 35.0–45.0)
PCO2 ART: 80.8 mmHg — AB (ref 35.0–45.0)
PH ART: 7.268 — AB (ref 7.350–7.450)
PO2 ART: 61 mmHg — AB (ref 80.0–100.0)
PO2 ART: 66 mmHg — AB (ref 80.0–100.0)
PO2 ART: 78 mmHg — AB (ref 80.0–100.0)
Patient temperature: 98.6
Patient temperature: 98.6
TCO2: 37 mmol/L (ref 0–100)
TCO2: 39 mmol/L (ref 0–100)
TCO2: 40 mmol/L (ref 0–100)
TCO2: 41 mmol/L (ref 0–100)
pCO2 arterial: 75.1 mmHg (ref 35.0–45.0)
pH, Arterial: 7.219 — ABNORMAL LOW (ref 7.350–7.450)
pH, Arterial: 7.261 — ABNORMAL LOW (ref 7.350–7.450)
pH, Arterial: 7.274 — ABNORMAL LOW (ref 7.350–7.450)
pO2, Arterial: 74 mmHg — ABNORMAL LOW (ref 80.0–100.0)

## 2015-01-18 LAB — URINALYSIS, ROUTINE W REFLEX MICROSCOPIC
BILIRUBIN URINE: NEGATIVE
Glucose, UA: >1000 mg/dL — AB
KETONES UR: NEGATIVE mg/dL
LEUKOCYTES UA: NEGATIVE
Nitrite: NEGATIVE
PH: 5 (ref 5.0–8.0)
PROTEIN: 100 mg/dL — AB
Specific Gravity, Urine: 1.017 (ref 1.005–1.030)
Urobilinogen, UA: 0.2 mg/dL (ref 0.0–1.0)

## 2015-01-18 LAB — PROTIME-INR
INR: 1.26 (ref 0.00–1.49)
Prothrombin Time: 15.9 seconds — ABNORMAL HIGH (ref 11.6–15.2)

## 2015-01-18 LAB — CBG MONITORING, ED
GLUCOSE-CAPILLARY: 595 mg/dL — AB (ref 65–99)
GLUCOSE-CAPILLARY: 557 mg/dL — AB (ref 65–99)
GLUCOSE-CAPILLARY: 467 mg/dL — AB (ref 65–99)
GLUCOSE-CAPILLARY: 330 mg/dL — AB (ref 65–99)
GLUCOSE-CAPILLARY: 276 mg/dL — AB (ref 65–99)
Glucose-Capillary: >600 mg/dL (ref 65–99)
Glucose-Capillary: 205 mg/dL — ABNORMAL HIGH (ref 65–99)
Glucose-Capillary: 174 mg/dL — ABNORMAL HIGH (ref 65–99)

## 2015-01-18 LAB — I-STAT ARTERIAL BLOOD GAS, ED
ACID-BASE EXCESS: 8 mmol/L — AB (ref 0.0–2.0)
Bicarbonate: 40.2 mEq/L — ABNORMAL HIGH (ref 20.0–24.0)
O2 Saturation: 84 %
PCO2 ART: 88.3 mmHg — AB (ref 35.0–45.0)
PH ART: 7.265 — AB (ref 7.350–7.450)
TCO2: 43 mmol/L (ref 0–100)
pO2, Arterial: 58 mmHg — ABNORMAL LOW (ref 80.0–100.0)

## 2015-01-18 LAB — HIV ANTIBODY (ROUTINE TESTING W REFLEX): HIV SCREEN 4TH GENERATION: NONREACTIVE

## 2015-01-18 LAB — GLUCOSE, CAPILLARY
GLUCOSE-CAPILLARY: 204 mg/dL — AB (ref 65–99)
GLUCOSE-CAPILLARY: 234 mg/dL — AB (ref 65–99)
Glucose-Capillary: 140 mg/dL — ABNORMAL HIGH (ref 65–99)
Glucose-Capillary: 146 mg/dL — ABNORMAL HIGH (ref 65–99)
Glucose-Capillary: 179 mg/dL — ABNORMAL HIGH (ref 65–99)

## 2015-01-18 LAB — URINE MICROSCOPIC-ADD ON

## 2015-01-18 LAB — RAPID URINE DRUG SCREEN, HOSP PERFORMED
Amphetamines: NOT DETECTED
BARBITURATES: NOT DETECTED
Benzodiazepines: NOT DETECTED
COCAINE: NOT DETECTED
OPIATES: NOT DETECTED
Tetrahydrocannabinol: NOT DETECTED

## 2015-01-18 LAB — I-STAT TROPONIN, ED: Troponin i, poc: 0 ng/mL (ref 0.00–0.08)

## 2015-01-18 LAB — STREP PNEUMONIAE URINARY ANTIGEN: Strep Pneumo Urinary Antigen: NEGATIVE

## 2015-01-18 LAB — MRSA PCR SCREENING: MRSA by PCR: NEGATIVE

## 2015-01-18 LAB — CBC
HCT: 48.1 % (ref 39.0–52.0)
Hemoglobin: 15.5 g/dL (ref 13.0–17.0)
MCH: 30.9 pg (ref 26.0–34.0)
MCHC: 32.2 g/dL (ref 30.0–36.0)
MCV: 96 fL (ref 78.0–100.0)
Platelets: 219 10*3/uL (ref 150–400)
RBC: 5.01 MIL/uL (ref 4.22–5.81)
RDW: 14.4 % (ref 11.5–15.5)
WBC: 8.7 10*3/uL (ref 4.0–10.5)

## 2015-01-18 LAB — CREATININE, URINE, RANDOM: Creatinine, Urine: 73.69 mg/dL

## 2015-01-18 LAB — ETHANOL: Alcohol, Ethyl (B): <5 mg/dL (ref <5)

## 2015-01-18 LAB — BRAIN NATRIURETIC PEPTIDE: B Natriuretic Peptide: 279 pg/mL — ABNORMAL HIGH (ref 0.0–100.0)

## 2015-01-18 LAB — TROPONIN I
Troponin I: 0.03 ng/mL (ref <0.031)
Troponin I: <0.03 ng/mL (ref <0.031)

## 2015-01-18 LAB — PROCALCITONIN: PROCALCITONIN: 0.75 ng/mL

## 2015-01-18 MED ORDER — DEXTROSE 5 % IV SOLN: 1.0000 g | INTRAVENOUS | Status: DC

## 2015-01-18 MED ORDER — SODIUM CHLORIDE 0.9 % IV SOLN
INTRAVENOUS | Status: DC
  Administered 2015-01-18: 5.4 [IU]/h via INTRAVENOUS
  Filled 2015-01-18: qty 2.5

## 2015-01-18 MED ORDER — ALBUTEROL SULFATE HFA 108 (90 BASE) MCG/ACT IN AERS: 2.0000 | INHALATION_SPRAY | RESPIRATORY_TRACT | Status: DC | PRN

## 2015-01-18 MED ORDER — IPRATROPIUM BROMIDE 0.02 % IN SOLN
0.5000 mg | Freq: Four times a day (QID) | RESPIRATORY_TRACT | Status: DC
  Filled 2015-01-18: qty 2.5

## 2015-01-18 MED ORDER — SODIUM CHLORIDE 0.9 % IV SOLN
INTRAVENOUS | Status: DC
  Administered 2015-01-18 (×2): via INTRAVENOUS
  Administered 2015-01-19: 75 mL/h via INTRAVENOUS

## 2015-01-18 MED ORDER — DEXMEDETOMIDINE HCL IN NACL 400 MCG/100ML IV SOLN
0.2000 ug/kg/h | INTRAVENOUS | Status: DC
Start: 2015-01-18 — End: 2015-01-19
  Administered 2015-01-18 (×2): 0.4 ug/kg/h via INTRAVENOUS
  Filled 2015-01-18: qty 100
  Filled 2015-01-18: qty 50

## 2015-01-18 MED ORDER — METOPROLOL TARTRATE 1 MG/ML IV SOLN
2.5000 mg | Freq: Three times a day (TID) | INTRAVENOUS | Status: DC
  Administered 2015-01-18 (×2): 2.5 mg via INTRAVENOUS
  Filled 2015-01-18 (×4): qty 5

## 2015-01-18 MED ORDER — HEPARIN SODIUM (PORCINE) 5000 UNIT/ML IJ SOLN
5000.0000 [IU] | Freq: Three times a day (TID) | INTRAMUSCULAR | Status: DC
  Administered 2015-01-18 – 2015-01-22 (×13): 5000 [IU] via SUBCUTANEOUS
  Filled 2015-01-18 (×17): qty 1

## 2015-01-18 MED ORDER — INSULIN ASPART 100 UNIT/ML ~~LOC~~ SOLN
0.0000 [IU] | SUBCUTANEOUS | Status: DC
  Administered 2015-01-18 – 2015-01-19 (×4): 3 [IU] via SUBCUTANEOUS

## 2015-01-18 MED ORDER — AMITRIPTYLINE HCL 50 MG PO TABS
50.0000 mg | ORAL_TABLET | Freq: Every day | ORAL | Status: DC
  Administered 2015-01-20 – 2015-01-21 (×2): 50 mg via ORAL
  Filled 2015-01-18 (×5): qty 1

## 2015-01-18 MED ORDER — DEXTROSE 5 % IV SOLN
500.0000 mg | INTRAVENOUS | Status: DC
  Administered 2015-01-19 – 2015-01-22 (×4): 500 mg via INTRAVENOUS
  Filled 2015-01-18 (×6): qty 500

## 2015-01-18 MED ORDER — INSULIN GLARGINE 100 UNIT/ML ~~LOC~~ SOLN
35.0000 [IU] | Freq: Two times a day (BID) | SUBCUTANEOUS | Status: DC
  Filled 2015-01-18 (×2): qty 0.35

## 2015-01-18 MED ORDER — POTASSIUM CHLORIDE 10 MEQ/100ML IV SOLN
10.0000 meq | INTRAVENOUS | Status: AC
  Administered 2015-01-18 (×4): 10 meq via INTRAVENOUS
  Filled 2015-01-18: qty 100

## 2015-01-18 MED ORDER — GABAPENTIN 300 MG PO CAPS
300.0000 mg | ORAL_CAPSULE | Freq: Three times a day (TID) | ORAL | Status: DC
  Filled 2015-01-18 (×2): qty 1

## 2015-01-18 MED ORDER — VERAPAMIL HCL 120 MG PO TABS
120.0000 mg | ORAL_TABLET | Freq: Every day | ORAL | Status: DC
  Filled 2015-01-18: qty 1

## 2015-01-18 MED ORDER — DEXTROSE 5 % IV SOLN
1.0000 g | INTRAVENOUS | Status: DC
  Administered 2015-01-19 – 2015-01-22 (×4): 1 g via INTRAVENOUS
  Filled 2015-01-18 (×6): qty 10

## 2015-01-18 MED ORDER — ALBUTEROL SULFATE (2.5 MG/3ML) 0.083% IN NEBU: 2.5000 mg | INHALATION_SOLUTION | RESPIRATORY_TRACT | Status: DC | PRN

## 2015-01-18 MED ORDER — HYDROXYZINE HCL 50 MG/ML IM SOLN
25.0000 mg | Freq: Four times a day (QID) | INTRAMUSCULAR | Status: DC | PRN
  Filled 2015-01-18: qty 0.5

## 2015-01-18 MED ORDER — SODIUM CHLORIDE 0.9 % IV BOLUS (SEPSIS)
500.0000 mL | Freq: Once | INTRAVENOUS | Status: AC
  Administered 2015-01-18: 500 mL via INTRAVENOUS

## 2015-01-18 MED ORDER — PNEUMOCOCCAL VAC POLYVALENT 25 MCG/0.5ML IJ INJ
0.5000 mL | INJECTION | INTRAMUSCULAR | Status: AC
  Administered 2015-01-19: 0.5 mL via INTRAMUSCULAR
  Filled 2015-01-18: qty 0.5

## 2015-01-18 MED ORDER — DEXTROSE 5 % IV SOLN
500.0000 mg | Freq: Once | INTRAVENOUS | Status: AC
  Administered 2015-01-18: 500 mg via INTRAVENOUS
  Filled 2015-01-18: qty 500

## 2015-01-18 MED ORDER — GEMFIBROZIL 600 MG PO TABS
600.0000 mg | ORAL_TABLET | Freq: Two times a day (BID) | ORAL | Status: DC
  Filled 2015-01-18 (×3): qty 1

## 2015-01-18 MED ORDER — OMEGA-3-ACID ETHYL ESTERS 1 G PO CAPS
2.0000 g | ORAL_CAPSULE | Freq: Two times a day (BID) | ORAL | Status: DC
  Administered 2015-01-19 – 2015-01-22 (×7): 2 g via ORAL
  Filled 2015-01-18 (×10): qty 2

## 2015-01-18 MED ORDER — CARVEDILOL 25 MG PO TABS
25.0000 mg | ORAL_TABLET | Freq: Two times a day (BID) | ORAL | Status: DC
  Filled 2015-01-18 (×3): qty 1

## 2015-01-18 MED ORDER — SODIUM CHLORIDE 0.9 % IV BOLUS (SEPSIS)
1000.0000 mL | Freq: Once | INTRAVENOUS | Status: AC
  Administered 2015-01-18: 1000 mL via INTRAVENOUS

## 2015-01-18 MED ORDER — NICOTINE 21 MG/24HR TD PT24
21.0000 mg | MEDICATED_PATCH | Freq: Every day | TRANSDERMAL | Status: DC
  Administered 2015-01-18 – 2015-01-22 (×5): 21 mg via TRANSDERMAL
  Filled 2015-01-18 (×5): qty 1

## 2015-01-18 MED ORDER — ASPIRIN 325 MG PO TABS: 325.0000 mg | ORAL_TABLET | Freq: Every day | ORAL | Status: DC

## 2015-01-18 MED ORDER — ALBUTEROL SULFATE (2.5 MG/3ML) 0.083% IN NEBU
2.5000 mg | INHALATION_SOLUTION | RESPIRATORY_TRACT | Status: DC
  Administered 2015-01-18 – 2015-01-21 (×19): 2.5 mg via RESPIRATORY_TRACT
  Filled 2015-01-18 (×19): qty 3

## 2015-01-18 MED ORDER — INSULIN GLARGINE 100 UNIT/ML ~~LOC~~ SOLN
25.0000 [IU] | Freq: Two times a day (BID) | SUBCUTANEOUS | Status: DC
  Administered 2015-01-18: 25 [IU] via SUBCUTANEOUS
  Filled 2015-01-18 (×4): qty 0.25

## 2015-01-18 MED ORDER — AZITHROMYCIN 500 MG IV SOLR: 500.0000 mg | INTRAVENOUS | Status: DC

## 2015-01-18 MED ORDER — DEXTROSE 5 % IV SOLN
1.0000 g | Freq: Once | INTRAVENOUS | Status: AC
  Administered 2015-01-18: 1 g via INTRAVENOUS
  Filled 2015-01-18: qty 10

## 2015-01-18 MED ORDER — METOPROLOL TARTRATE 1 MG/ML IV SOLN
INTRAVENOUS | Status: AC
  Filled 2015-01-18: qty 5

## 2015-01-18 MED ORDER — INSULIN GLARGINE 100 UNIT/ML ~~LOC~~ SOLN: 20.0000 [IU] | Freq: Two times a day (BID) | SUBCUTANEOUS | Status: DC

## 2015-01-18 NOTE — ED Notes (Signed)
Pt was found sitting on the end of the stretcher with bipap mask off trying to use the urinal. Pt unable to use the urinal, placed back on the bipap and repositioned back up in the bed.

## 2015-01-18 NOTE — Progress Notes (Signed)
  Echocardiogram 2D Echocardiogram has been performed.  Jerry Cantrell 01/18/2015, 4:12 PM

## 2015-01-18 NOTE — Progress Notes (Signed)
PT Cancellation Note  Patient Details Name: Jerry Cantrell MRN: [id number] DOB: [birth year]   Cancelled Treatment:    Reason Eval/Treat Not Completed: Patient not medically ready. Discussed pt case with RN who states pt is not appropriate for PT at this time. Will check back tomorrow and if pt is medically ready for PT will complete evaluation at that time.    Rolinda Roan 01/18/2015, 11:37 AM   Rolinda Roan, PT, DPT Acute Rehabilitation Services Pager: [phone]

## 2015-01-18 NOTE — ED Notes (Signed)
Pt moved to room 2 in Pod A to be able to see pt better from nurses station and placed on a hospital bed.

## 2015-01-18 NOTE — Consult Note (Signed)
PULMONARY / CRITICAL CARE MEDICINE   Name: Jerry Cantrell MRN: [id number] DOB: September 15, 1965    ADMISSION DATE:  01/17/2015 CONSULTATION DATE:  5/20  REFERRING MD :  Jerry Cantrell   CHIEF COMPLAINT:  Hypercarbic respiratory failure   INITIAL PRESENTATION:  49 year old smoker who appears much older than stated age. Carries h/o chronic dyspnea in setting of mild COPD, and what is likely untreated OSA/OHS as well as diastolic HF. Admitted 5/19 w/ RUL PNA and acute on chronic hypoxic and hypercarbic resp failure requiring NIPPV. PCCM asked to assist w/ care  STUDIES:  CT head 5/19: negative   SIGNIFICANT EVENTS:    HISTORY OF PRESENT ILLNESS:   49 year old male followed by Dr Jerry Cantrell for dyspnea. Active smoker PFTs w/ FEV1 69% and baseline dyspnea out of proportion to these finding.  Also has h/o Probable OHS/OSA and diastolic HF and stage III CKD. Presents to ED on 5/19 w/ report of 5 days of increased fatigue and malaise, productive cough w/ black, brown mucous, and progressive respiratory failure w/ mild chest discomfort. On arrival of EMS was found to have circumoral cyanosis, w/ O2 sats in 70s. Initial glucose was in 700s, CXR w/ RUL airspace disaese and ABG w/ PCO2 88 (calculated baseline 67). He was admitted to the SDU on NIPPV, empiric CAP coverage and IV hydration. 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  has past  surgical history that includes Tonsillectomy; Abdominal surgery; and Colonoscopy w/ polypectomy. Prior to Admission medications   Medication Sig Start Date End Date Taking? Authorizing Provider  albuterol (PROVENTIL HFA;VENTOLIN HFA) 108 (90 BASE) MCG/ACT inhaler Inhale 2 puffs into the lungs every 4 (four) hours as needed for wheezing or shortness of breath. 06/16/13  Yes Otho Bellows, MD  amitriptyline (ELAVIL) 50 MG tablet Take 50 mg by mouth at bedtime.   Yes Historical Provider, MD  amLODipine (NORVASC) 10 MG tablet Take 1 tablet (10 mg total) by mouth daily. 06/26/14  Yes Comer Locket, PA-C  aspirin 325 MG tablet Take 325 mg by mouth daily.   Yes Historical Provider, MD  carvedilol (COREG) 25 MG tablet Take 1 tablet (25 mg total) by mouth 2 (two) times daily with a meal. 06/26/14  Yes Comer Locket, PA-C  furosemide (LASIX) 80 MG tablet Take 1-2 tablets (80-160 mg total) by mouth daily as needed for fluid. Patient taking differently: Take 80 mg by mouth daily.  06/26/14  Yes Benjamin Cartner, PA-C  gabapentin (NEURONTIN) 300 MG capsule Take 300 mg by mouth 3 (three) times daily.   Yes Historical Provider, MD  gemfibrozil (LOPID) 600 MG tablet Take 600 mg by mouth 2 (two) times daily before a meal.   Yes Historical Provider, MD  hydrALAZINE (APRESOLINE) 100 MG tablet Take 1 tablet (100 mg total) by mouth 3 (three) times daily. 06/26/14  Yes Benjamin Cartner, PA-C  metolazone (ZAROXOLYN) 5 MG tablet Take 1 tablet (5 mg total) by mouth daily. 06/26/14  Yes Comer Locket, PA-C  omega-3 acid ethyl esters (LOVAZA) 1 G capsule Take 2 g by mouth 2 (two)  times daily.    Yes Historical Provider, MD  verapamil (CALAN) 120 MG tablet Take 120 mg by mouth daily.   Yes Historical Provider, MD  fluticasone (FLONASE) 50 MCG/ACT nasal spray Place 1-2 sprays into both nostrils 2 (two) times daily.    Historical Provider, MD  insulin aspart (NOVOLOG) 100 UNIT/ML injection Inject 10-30 Units into the  skin 3 (three) times daily before meals. Per sliding scale 06/26/14   Comer Locket, PA-C  insulin glargine (LANTUS) 100 UNIT/ML injection Inject 0.35 mLs (35 Units total) into the skin 2 (two) times daily. 06/26/14   Comer Locket, PA-C  ipratropium (ATROVENT) 0.02 % nebulizer solution Take 2.5 mLs (0.5 mg total) by nebulization 4 (four) times daily. 06/26/14   Comer Locket, PA-C  verapamil (CALAN-SR) 120 MG CR tablet Take 1 tablet (120 mg total) by mouth at bedtime. 06/26/14   Comer Locket, PA-C   No Known Allergies  FAMILY HISTORY:  indicated that his mother is alive. He indicated that his father is alive.  SOCIAL HISTORY:  reports that he has been smoking Cigarettes.  He has a 12 pack-year smoking history. He has never used smokeless tobacco. He reports that he drinks about 3.6 oz of alcohol per week. He reports that he uses illicit drugs (Marijuana).  REVIEW OF SYSTEMS:  Unable   VITAL SIGNS: Temp:  [98.1 F (36.7 C)-99.1 F (37.3 C)] 99.1 F (37.3 C) (05/20 1030) Pulse Rate:  [64-75] 74 (05/20 1400) Resp:  [9-25] 20 (05/20 1400) BP: (103-164)/(56-134) 164/107 mmHg (05/20 1400) SpO2:  [81 %-98 %] 96 % (05/20 1400) FiO2 (%):  [80 %] 80 % (05/20 0401) Weight:  [132 kg (291 lb 0.1 oz)] 132 kg (291 lb 0.1 oz) (05/20 1030) HEMODYNAMICS:   VENTILATOR SETTINGS: Vent Mode:  [-]  FiO2 (%):  [80 %] 80 % INTAKE / OUTPUT:  Intake/Output Summary (Last 24 hours) at 01/18/15 1410 Last data filed at 01/18/15 1400  Gross per 24 hour  Intake 5564.58 ml  Output    795 ml  Net 4769.58 ml    PHYSICAL EXAMINATION: General:  Obese male, resting in bed. Tolerating BIPAP currently  Neuro:  Awake, alert, moves all ext. Confused at times, but cooperative  HEENT:  Neck large. No JVD  Cardiovascular:  rrr Lungs:  Distant w/ + airleak from BIPAP mask  Abdomen:  Obese  Musculoskeletal:  Intact  Skin:    LABS:  CBC  Recent Labs Lab 01/18/15  WBC 8.7  HGB 15.5  HCT 48.1   PLT 219   Coag's  Recent Labs Lab 01/18/15 0822  APTT 28  INR 1.26   BMET  Recent Labs Lab 01/18/15 01/18/15 0822  NA 131* 138  K 3.8 3.4*  CL 82* 93*  CO2 34* 32  BUN 82* 76*  CREATININE 3.59* 3.33*  GLUCOSE 784* 243*   Electrolytes  Recent Labs Lab 01/18/15 01/18/15 0822  CALCIUM 9.0 8.8*   Sepsis Markers  Recent Labs Lab 01/18/15 0822 01/18/15 0832 01/18/15 1100  LATICACIDVEN  --  1.2 0.8  PROCALCITON 0.75  --   --    ABG  Recent Labs Lab 01/18/15 0156 01/18/15 1120 01/18/15 1248  PHART 7.265* 7.219* 7.268*  PCO2ART 88.3* 93.1* 81.1*  PO2ART 58.0* 78.0* 61.0*   Liver Enzymes No results for input(s): AST, ALT, ALKPHOS, BILITOT, ALBUMIN in the last 168 hours. Cardiac Enzymes  Recent Labs Lab 01/18/15 0512 01/18/15 1100  TROPONINI 0.03 <0.03   Glucose  Recent Labs Lab 01/18/15  [id number] 01/18/15 0524 01/18/15 [id number] 01/18/15 0726 01/18/15 0827 01/18/15 0932  GLUCAP 557* 467* 330* 276* 205* 174*    Imaging No results found.   ASSESSMENT / PLAN:  PULMONARY OETT  A: Acute on chronic hypoxic and hypercarbic respiratory failure in setting of CAP superimposed on underlying OHS, prob OSA and COPD.  >calculated expected CO2 should be in ~67 at baseline.   P:   Cont NIPPV; will probably need BIPAP and not CPAP at home Will try servo NIPPV and bigger mask to see if we can adjust for leak (should also help) Scheduled BDs High risk for intubation, however he is protecting his airway currently   CARDIOVASCULAR CVL A:  H/o diastolic HF HTN P:  hydralazine for HTN Hold diuretics for now   RENAL A:   Acute on chronic renal failure Stage III CKD (baseline creatinine looks to be 1.5 range) Mild hypokalemia   P:   Cont current IVFs Repeat chem this afternoon Renal dose meds rx htn  Strict I&O Consider lasix 5/21 if BP remains stable    GASTROINTESTINAL A:   Obesity  P:   NPO  PPI   HEMATOLOGIC A:   No acute  P:   Trend cbc  [place] heparin   INFECTIOUS A:   CAP (NOS) P:   BCx2 5/20>>> u strep 5/20>>>neg u legionella 5/20>>> azith 5/20>>> Rocephin 5/20>>  ENDOCRINE A:   DM w/ hyperglycemia  P:   ssi   NEUROLOGIC A:   Acute encephalopathy P:   RASS goal: 0 Hold all sedating meds.  Correct hypercarbia    FAMILY  - Updates:son at bedside     TODAY'S SUMMARY:   49 year old male w/ probably chronic resp failure on basis of COPD, OHS and OSA as well as diastolic HF. It seems as though the OHS/OSA has been largely untreated. Now here w/ PNA. Does not seem to be exacerbation of his underlying COPD. For now will cont BIPAP, adjust mask fit, cont BDs and abx. Hope we can sneak him by this w/out intubation. I think he will need BIPAP at home, not CPAP  Erick Colace ACNP-BC [facility] Pager # [phone] OR # [phone] if no answer   01/18/2015, 2:10 PM   PCCM ATTENDING: I have reviewed pt's initial presentation, consultants notes and hospital database in detail.  The above assessment and plan was formulated under my direction.  In summary: History and CXR are compatible with CAP Acute hypercarbic respiratory failure - this is unusual in setting of PNA which usually stimulates ventilatory drive  Suspect component of OHS Acute septic delirium AKI   PLAN: Cont NPPV - settings adjusted Cont current abx Monitor BMET intermittently Monitor I/Os Correct electrolytes as indicated Minimize all sedating meds Monitor closely in ICU for possible need for intubation   Merton Border, MD;  PCCM service; Mobile [phone]

## 2015-01-18 NOTE — Progress Notes (Signed)
Increased ipap and epap due to ABG.MD was notified by RN.

## 2015-01-18 NOTE — ED Notes (Signed)
Pt alert, yet confused at times. Tongue is cracked and appears swollen; given ice chips. Pt c/o hyperglycemia, reports running out of needles at home to take insulin. Pt also c/o increased shortness of breath x 1 week; at baseline pt reports shortness of breath. Lung sounds diminished. Pt lethargic.

## 2015-01-18 NOTE — ED Provider Notes (Signed)
This chart was scribed for [place], DO by Meriel Pica, ED Scribe. This patient was seen in room A01C/A01C and the patient's care was started 12:02 AM.  TIME SEEN: 12:02am  CHIEF COMPLAINT: Hyperglycemia   LEVEL 5 CAVEAT due to AMS  HPI:  Jerry Cantrell is a 49 y.o. male with a PMhx of tobacco abuse, HTN, HLD, and IDDM, brought in by ambulance who presents to the Emergency Department for hyperglycemia (CBG >600). Per nurse, friend states hyperglycemic state has been going on for about the past 7 days and confused, since last Thursday. Friend called 911. Pt claims mouth is dry and there is associated shortness of breath. Was found to be hypoxic with oxygen saturations in the 70s per EMS. Patient denies being on oxygen. Patient is complaining of headache and chest pain. He states he has had recent alcohol use but denies drug use.   ROS: See HPI Level 5 caveat due to AMS    PAST MEDICAL HISTORY/PAST SURGICAL HISTORY:  Past Medical History  Diagnosis Date  . Hypertension   . Hyperlipidemia   . Meralgia paraesthetica 05/03/2012  . Lumbar spondylosis 05/03/2012  . Lumbar spinal stenosis 05/03/2012    Mild with only right L4 nerve root encroachment, no neurogenic claudication   . Urinary incontinence 05/12/2012    Since the start of Sept. 2013   . Hemorrhoids 05/12/2012  . UTI (lower urinary tract infection)   . Meralgia paraesthetica 05/03/2012    On Lyrica which does improve pain.   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 Substance abuse   . Hyperlipidemia   . Pneumonia   . Diabetes mellitus   . 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 Acute diastolic heart failure   . Retinopathy   . CHF (congestive heart failure)   . Obesity     MEDICATIONS:  Prior to Admission medications   Medication Sig Start Date End Date Taking? Authorizing Provider  albuterol (PROVENTIL HFA;VENTOLIN HFA) 108 (90 BASE) MCG/ACT inhaler Inhale 2 puffs into the lungs every 4 (four) hours as needed for  wheezing or shortness of breath. 06/16/13   Otho Bellows, MD  amLODipine (NORVASC) 10 MG tablet Take 1 tablet (10 mg total) by mouth daily. 06/26/14   Comer Locket, PA-C  carvedilol (COREG) 25 MG tablet Take 1 tablet (25 mg total) by mouth 2 (two) times daily with a meal. 06/26/14   Comer Locket, PA-C  fluticasone (FLONASE) 50 MCG/ACT nasal spray Place 1-2 sprays into both nostrils 2 (two) times daily.    Historical Provider, MD  furosemide (LASIX) 80 MG tablet Take 1-2 tablets (80-160 mg total) by mouth daily as needed for fluid. 06/26/14   Comer Locket, PA-C  hydrALAZINE (APRESOLINE) 100 MG tablet Take 1 tablet (100 mg total) by mouth 3 (three) times daily. 06/26/14   Comer Locket, PA-C  insulin aspart (NOVOLOG) 100 UNIT/ML injection Inject 10-30 Units into the skin 3 (three) times daily before meals. Per sliding scale 06/26/14   Comer Locket, PA-C  insulin glargine (LANTUS) 100 UNIT/ML injection Inject 0.35 mLs (35 Units total) into the skin 2 (two) times daily. 06/26/14   Comer Locket, PA-C  ipratropium (ATROVENT) 0.02 % nebulizer solution Take 2.5 mLs (0.5 mg total) by nebulization 4 (four) times daily. 06/26/14   Comer Locket, PA-C  metolazone (ZAROXOLYN) 5 MG tablet Take 1 tablet (5 mg total) by mouth daily. 06/26/14   Comer Locket, PA-C  omega-3 acid ethyl esters (LOVAZA) 1 G capsule Take 2  g by mouth 2 (two) times daily.     Historical Provider, MD  verapamil (CALAN-SR) 120 MG CR tablet Take 1 tablet (120 mg total) by mouth at bedtime. 06/26/14   Comer Locket, PA-C    ALLERGIES:  No Known Allergies  SOCIAL HISTORY:  History  Substance Use Topics  . Smoking status: Current Every Day Smoker -- 0.40 packs/day for 30 years    Types: Cigarettes  . Smokeless tobacco: Never Used     Comment: "Trying - hard to stop"  . Alcohol Use: 3.6 oz/week    6 Cans of beer per week     Comment: beer    FAMILY HISTORY: Family History  Problem Relation Age of  Onset  . Prostate cancer Father   . Alcohol abuse Father   . Emphysema Father     smoked    EXAM: BP 127/80 mmHg  Pulse 66  Temp(Src) 98.1 F (36.7 C) (Oral)  Resp 16  SpO2 97%  CONSTITUTIONAL: Alert and oriented 3 but difficult to understand secondary to right mucous membranes, appears very drowsy but arousable HEAD: Normocephalic EYES: Conjunctivae clear, PERRL ENT: normal nose; no rhinorrhea; strangely dry mucous membranes; pharynx without lesions noted NECK: Supple, no meningismus, no LAD  CARD: RRR; S1 and S2 appreciated; no murmurs, no clicks, no rubs, no gallops RESP: Normal chest excursion without splinting or tachypnea; breath sounds clear and equal bilaterally; no wheezes, no rhonchi, no rales, no hypoxia or respiratory distress, speaking full sentences ABD/GI: Normal bowel sounds; non-distended; soft, non-tender, no rebound, no guarding, no peritoneal signs BACK:  The back appears normal and is non-tender to palpation, there is no CVA tenderness EXT: Normal ROM in all joints; non-tender to palpation; no edema; normal capillary refill; no cyanosis, no calf tenderness or swelling    SKIN: Normal color for age and race; warm NEURO: Moves all extremities equally, sensation to light touch intact diffusely, cranial nerves II through XII intact, slurred speech which is likely secondary to dry mucous membranes PSYCH: The patient's mood and manner are appropriate. Grooming and personal hygiene are appropriate.  MEDICAL DECISION MAKING: Patient here with hyperglycemia, altered mental status, hypoxia. Will obtain labs, chest x-ray, head CT. Does have a history of CHF with an ejection fraction of 50-55%. Will give gentle IV hydration and start insulin drip. Patient will need admission.  ED PROGRESS: Chest x-ray shows right upper lobe pneumonia. No recent admission. We'll start ceftriaxone and azithromycin and sent blood cultures. He also has underlying vascular congestion and  cardiomegaly without overt pulmonary edema does not appear volume overload on exam. Will continue IV hydration. He is also hyperglycemic without DKA. Continuing his insulin drip. Patient has a respiratory acidosis. We'll start the patient on BiPAP. Patient also has acute renal failure which is likely secondary to dehydration. Head CT shows no acute intracranial process. Discussed with Dr. Blaine Hamper with hospitalist service for admission to step down bed.     EKG Interpretation  Date/Time:  Thursday Jan 17 2015 23:54:04 EDT Ventricular Rate:  67 PR Interval:  228 QRS Duration: 131 QT Interval:  523 QTC Calculation: 552 R Axis:   -26 Text Interpretation:  Age not entered, assumed to be  49 years old for purpose of ECG interpretation Sinus rhythm Prolonged PR interval Probable left atrial enlargement Left bundle branch block Baseline wander No significant change since last tracing Confirmed by Jinelle Butchko,  DO, Zalan Shidler [id number]) on 01/18/2015 12:11:57 AM      CRITICAL CARE Performed by: Leonides Schanz,  Tad Fancher N   Total critical care time: 45 minutes - hypoxia, community-acquired pneumonia, acute renal failure, dehydration, hyperglycemia on insulin drip, respiratory acidosis on BiPAP  Critical care time was exclusive of separately billable procedures and treating other patients.  Critical care was necessary to treat or prevent imminent or life-threatening deterioration.  Critical care was time spent personally by me on the following activities: development of treatment plan with patient and/or surrogate as well as nursing, discussions with consultants, evaluation of patient's response to treatment, examination of patient, obtaining history from patient or surrogate, ordering and performing treatments and interventions, ordering and review of laboratory studies, ordering and review of radiographic studies, pulse oximetry and re-evaluation of patient's condition.    I personally performed the services described in  this documentation, which was scribed in my presence. The recorded information has been reviewed and is accurate.    [place], DO 01/18/15 [phone]

## 2015-01-18 NOTE — ED Notes (Signed)
Pt attempted to use urinal without success.

## 2015-01-18 NOTE — ED Notes (Signed)
Spoke with Dr. Francene Boyers, she instructed [place] to give the lantus now and to get a stat BMP.

## 2015-01-18 NOTE — ED Notes (Signed)
Fluids increased to 1,043ml per Dr.Niu

## 2015-01-18 NOTE — ED Notes (Signed)
RT at bedside for bi-pap. Pt becoming more confused and is talking to " a woman in the corner." No one at bedside.

## 2015-01-18 NOTE — Progress Notes (Addendum)
[facility] hospitalists  Jerry Cantrell is a 49 year old male with past medical history of hypertension, hyperlipidemia, diabetes mellitus, depression, diastolic heart failure, CK D3 and chronic back pain who presented to the hospital with altered mental status cough and dyspnea. He was found to have a community-acquired pneumonia and had significant amount of respiratory distress and therefore started on a BiPAP.  I evaluated the patient this afternoon-he continued to be confused. He was not in any significant respiratory distress-respiratory rate was 17-20 breaths per minute- chest exam revealed bilateral rhonchi-heart regular rate and rhythm no murmurs  Principal Problem:   CAP with acute respiratory failure -Continue BiPAP as tolerated-ABG continues to show respiratory acidosis-have asked pulmonary to evaluate him to see if intubation is needed -He has been started on ceftriaxone and azithromycin -Urine strep and Legionella antigen ordered -Albuterol when necessary   Active Problems:    Acute encephalopathy -Secondary to sepsis and hypoxia-continue to follow    Insulin dependent type 2 diabetes mellitus, uncontrolled   Decreased peripheral vision of left eye   Proliferative diabetic retinopathy -Significantly elevated sugars noted in the ER with glucose being 784-required insulin infusion initially but was not in DKA -Lantus resumed at lower dosage as he is NPO -Continue sliding scale insulin    Hypertension -Place on IV Lopressor and hold oral meds for now- he takes Coreg, hydralazine, metolazone, first night and verapamil at home  Acute renal failure/chronic kidney disease stage III -Baseline creatinine 1.3-1.5-creatinine on admission 3.59 BUN of 82 - Likely secondary to diuretics in setting of sepsis- continue to hydrate and follow I&O closely- holding diuretics    Smoker -Nicotine patch applied    Obstructive sleep apnea -We'll need CPAP at bedtime once transitioned off of  BiPAP    Depression -Oral medications on hold    Diastolic heart failure secondary to hypertension -Last echo in 4/15 revealed in EF of 50-55% and grade 1 diastolic dysfunction-diuretics on hold as he is dehydrated and in acute renal failure  Hypokalemia -Replacing-recheck tomorrow morning    Debbe Odea, MD Pager: [email or web address]

## 2015-01-18 NOTE — Progress Notes (Signed)
Pt arrived from ED lethargic, confused, alert to person only; VS per flowsheet; labs reviewed; ABG obtained with results to Dr Wynelle Cleveland at bedside; orders received; resp therapist adjusting bipap; will continue to closely monitor

## 2015-01-18 NOTE — ED Notes (Signed)
MD Riswan notified that pt was unable to tolerate veni mask and was placed back on BI-pap

## 2015-01-18 NOTE — Care Management Note (Signed)
Case Management Note  Patient Details  Name: Jerry Cantrell MRN: [id number] Date of Birth: August 05, 1966  Subjective/Objective:       Adm w hyperglycemia             Action/Plan: from home, pcp dr osei-bonsu   Expected Discharge Date:                  Expected Discharge Plan:  Home/Self Care  In-House Referral:     Discharge planning Services     Post Acute Care Choice:    Choice offered to:     DME Arranged:    DME Agency:     HH Arranged:    [place] Agency:     Status of Service:     Medicare Important Message Given:    Date Medicare IM Given:    Medicare IM give by:    Date Additional Medicare IM Given:    Additional Medicare Important Message give by:     If discussed at [place] of Stay Meetings, dates discussed:    Additional Comments: ur review done  Lacretia Leigh, RN 01/18/2015, 11:19 AM

## 2015-01-18 NOTE — Progress Notes (Signed)
ANTIBIOTIC CONSULT NOTE - INITIAL  Pharmacy Consult : may adjust antibiotic dose based on patient's renal function.   No Known Allergies  Patient Measurements:     Vital Signs: Temp: 98.1 F (36.7 C) (05/19 2359) Temp Source: Oral (05/19 2359) BP: 120/67 mmHg (05/20 0400) Pulse Rate: 64 (05/20 0400) Intake/Output from previous day: 05/19 0701 - 05/20 0700 In: 2000 [I.V.:2000] Out: -  Intake/Output from this shift: Total I/O In: 2000 [I.V.:2000] Out: -   Labs:  Recent Labs  01/18/15  WBC 8.7  HGB 15.5  PLT 219  CREATININE 3.59*   CrCl cannot be calculated (Unknown ideal weight.). No results for input(s): VANCOTROUGH, VANCOPEAK, VANCORANDOM, GENTTROUGH, GENTPEAK, GENTRANDOM, TOBRATROUGH, TOBRAPEAK, TOBRARND, AMIKACINPEAK, AMIKACINTROU, AMIKACIN in the last 72 hours.   Microbiology: No results found for this or any previous visit (from the past 720 hour(s)).  Medical History: Past Medical History  Diagnosis Date  . Hypertension   . Hyperlipidemia   . Meralgia paraesthetica 05/03/2012  . Lumbar spondylosis 05/03/2012  . Lumbar spinal stenosis 05/03/2012    Mild with only right L4 nerve root encroachment, no neurogenic claudication   . Urinary incontinence 05/12/2012    Since the start of Sept. 2013   . Hemorrhoids 05/12/2012  . UTI (lower urinary tract infection)   . Meralgia paraesthetica 05/03/2012    On Lyrica which does improve pain.   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 Substance abuse   . Hyperlipidemia   . Pneumonia   . Diabetes mellitus   . 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 Acute diastolic heart failure   . Retinopathy   . CHF (congestive heart failure)   . Obesity     Medications:  Scheduled:  . amitriptyline  50 mg Oral QHS  . aspirin  325 mg Oral Daily  . carvedilol  25 mg Oral BID WC  . gabapentin  300 mg Oral TID  . gemfibrozil  600 mg Oral BID AC  . heparin  5,000 Units Subcutaneous 3 times per day  . insulin glargine  20  Units Subcutaneous BID  . ipratropium  0.5 mg Nebulization QID  . nicotine  21 mg Transdermal Daily  . omega-3 acid ethyl esters  2 g Oral BID  . verapamil  120 mg Oral Daily   Assessment: Patient to ED via [facility] c/o hyperglycemia, lethargy, and increased shortness of breath. Initial sats in the 70s with cyanosis around nose and lips. 49 y.o male started on Azithromycin 500mg  IV q24h and Ceftriaxone 1gm IV q24h SCr 3.59   Goal of Therapy:  Appropriate antibiotic dosing for patient's renal function.   Plan:  No adjustment in renal dysfunction for azithromycin and ceftriaxone.    Nicole Cella, RPh Clinical Pharmacist Pager: [phone] 01/18/2015,4:17 AM

## 2015-01-18 NOTE — ED Notes (Signed)
Ultrasound at the bedside

## 2015-01-18 NOTE — Progress Notes (Signed)
[place] Progress Note Patient Name: Jerry Cantrell DOB: February 09, 1966 MRN: [id number]   Date of Service  01/18/2015  HPI/Events of Note  Agitated -?hypercarbia vs withdrawal  eICU Interventions  ABG Precedex gtt     Intervention Category Major Interventions: Delirium, psychosis, severe agitation - evaluation and management  Jezebelle Ledwell V. 01/18/2015, 8:33 PM

## 2015-01-18 NOTE — ED Notes (Signed)
Bladder scanner showed 342ml.

## 2015-01-18 NOTE — H&P (Signed)
[facility] History and Physical  Jerry Cantrell [id number] DOB: 01-09-1966 DOA: 01/17/2015  Referring physician: ED physician PCP: Benito Mccreedy, MD  Specialists:   Chief Complaint: AMS, cough and SOB  HPI: Jerry Cantrell is a 49 y.o. male with PMH of hypertension, hyperlipidemia, diabetes mellitus, depression, diastolic congestive heart failure, back pain, chronic kidney disease-stage III, who presents with altered mental status, cough and shortness of breath.  Patient has altered mental status. History is obtained from EMS and ED's report and partially from patient himself. It seems that he has been having shortness of breath and cough with dark brown sputum production at home. He has a mild chest pain. He was found to be hypoxia with oxygen saturations in the 70s per EMS. Of note, per nurse, friend states hyperglycemic state has been going on for about the past 7 days and confused, since last Thursday. Friend called 911. Pt claims mouth is dry .   It seems that patient does not have fever, running nose, abdominal pain, diarrhea, dysuria, hematuria, skin rashes or leg swelling. No unilateral weakness, numbness or tingling sensations. No vision change or hearing loss.  In ED, patient was found to have right lower lobe pneumonia by chest x-ray. AoCKD, BNP 279, negative troponin, WBC 8.7, temperature 98.1, no tachycardia. Blood sugar 784 on BMP. ABG showed pH 7.265, PCO2 88.3, PO2 58. CT head is negative for acute abnormalities. Patient is admitted to inpatient for further evaluation and treatment.  Where does patient live?   At home    Can patient participate in ADLs?  Yes   Review of Systems: Could not be done due to AMS   Allergy: No Known Allergies  Past Medical History  Diagnosis Date  . Hypertension   . Hyperlipidemia   . Meralgia paraesthetica 05/03/2012  . Lumbar spondylosis 05/03/2012  . Lumbar spinal stenosis 05/03/2012    Mild with only right L4 nerve root encroachment,  no neurogenic claudication   . Urinary incontinence 05/12/2012    Since the start of Sept. 2013   . Hemorrhoids 05/12/2012  . UTI (lower urinary tract infection)   . Meralgia paraesthetica 05/03/2012    On Lyrica which does improve pain.   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 Substance abuse   . Hyperlipidemia   . Pneumonia   . Diabetes mellitus   . 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 Acute diastolic heart failure   . Retinopathy   . CHF (congestive heart failure)   . Obesity     Past Surgical History  Procedure Laterality Date  . Tonsillectomy    . Abdominal surgery      Abscess I&D 2/2 infected hair  . Colonoscopy w/ polypectomy      pt to bring records    Social History:  reports that he has been smoking Cigarettes.  He has a 12 pack-year smoking history. He has never used smokeless tobacco. He reports that he drinks about 3.6 oz of alcohol per week. He reports that he uses illicit drugs (Marijuana).  Family History:  Family History  Problem Relation Age of Onset  . Prostate cancer Father   . Alcohol abuse Father   . Emphysema Father     smoked     Prior to Admission medications   Medication Sig Start Date End Date Taking? Authorizing Provider  albuterol (PROVENTIL HFA;VENTOLIN HFA) 108 (90 BASE) MCG/ACT inhaler Inhale 2 puffs into the lungs every 4 (four) hours as needed for wheezing or shortness of breath. 06/16/13  Yes Otho Bellows, MD  amitriptyline (ELAVIL) 50 MG tablet Take 50 mg by mouth at bedtime.   Yes Historical Provider, MD  amLODipine (NORVASC) 10 MG tablet Take 1 tablet (10 mg total) by mouth daily. 06/26/14  Yes Comer Locket, PA-C  aspirin 325 MG tablet Take 325 mg by mouth daily.   Yes Historical Provider, MD  carvedilol (COREG) 25 MG tablet Take 1 tablet (25 mg total) by mouth 2 (two) times daily with a meal. 06/26/14  Yes Comer Locket, PA-C  furosemide (LASIX) 80 MG tablet Take 1-2 tablets (80-160 mg total) by mouth daily as  needed for fluid. Patient taking differently: Take 80 mg by mouth daily.  06/26/14  Yes Benjamin Cartner, PA-C  gabapentin (NEURONTIN) 300 MG capsule Take 300 mg by mouth 3 (three) times daily.   Yes Historical Provider, MD  gemfibrozil (LOPID) 600 MG tablet Take 600 mg by mouth 2 (two) times daily before a meal.   Yes Historical Provider, MD  hydrALAZINE (APRESOLINE) 100 MG tablet Take 1 tablet (100 mg total) by mouth 3 (three) times daily. 06/26/14  Yes Benjamin Cartner, PA-C  metolazone (ZAROXOLYN) 5 MG tablet Take 1 tablet (5 mg total) by mouth daily. 06/26/14  Yes Comer Locket, PA-C  omega-3 acid ethyl esters (LOVAZA) 1 G capsule Take 2 g by mouth 2 (two) times daily.    Yes Historical Provider, MD  verapamil (CALAN) 120 MG tablet Take 120 mg by mouth daily.   Yes Historical Provider, MD  fluticasone (FLONASE) 50 MCG/ACT nasal spray Place 1-2 sprays into both nostrils 2 (two) times daily.    Historical Provider, MD  insulin aspart (NOVOLOG) 100 UNIT/ML injection Inject 10-30 Units into the skin 3 (three) times daily before meals. Per sliding scale 06/26/14   Comer Locket, PA-C  insulin glargine (LANTUS) 100 UNIT/ML injection Inject 0.35 mLs (35 Units total) into the skin 2 (two) times daily. 06/26/14   Comer Locket, PA-C  ipratropium (ATROVENT) 0.02 % nebulizer solution Take 2.5 mLs (0.5 mg total) by nebulization 4 (four) times daily. 06/26/14   Comer Locket, PA-C  verapamil (CALAN-SR) 120 MG CR tablet Take 1 tablet (120 mg total) by mouth at bedtime. 06/26/14   Comer Locket, PA-C    Physical Exam: Filed Vitals:   01/18/15 0430 01/18/15 0445 01/18/15 0500 01/18/15 0515  BP: 103/67 139/79 140/123 148/107  Pulse: 66 65 66 67  Temp:      TempSrc:      Resp: 13 20 21 25   SpO2: 95% 94% 94% 94%   General: drowsy HEENT:       Eyes: PERRL, EOMI, no scleral icterus.       ENT: No discharge from the ears and nose, no pharynx injection, no tonsillar enlargement.         Neck: No JVD, no bruit, no mass felt. Heme: No neck lymph node enlargement. Cardiac: S1/S2, RRR, No murmurs, No gallops or rubs. Pulm: has rales and rhonchi bialterally and posteriorly Abd: Soft, nondistended, nontender, no rebound pain, no organomegaly, BS present. Ext: No edema bilaterally. 2+DP/PT pulse bilaterally. Musculoskeletal: No joint deformities, No joint redness or warmth, no limitation of ROM in spin. Skin: No rashes.  Neuro: consufed, not oriented X3, cranial nerves II-XII grossly intact, moves all extremities. Psych: unable to review due to [place] on Admission:  Basic Metabolic Panel:  Recent Labs Lab 01/18/15  NA 131*  K 3.8  CL 82*  CO2 34*  GLUCOSE 784*  BUN 82*  CREATININE 3.59*  CALCIUM 9.0   Liver Function Tests: No results for input(s): AST, ALT, ALKPHOS, BILITOT, PROT, ALBUMIN in the last 168 hours. No results for input(s): LIPASE, AMYLASE in the last 168 hours. No results for input(s): AMMONIA in the last 168 hours. CBC:  Recent Labs Lab 01/18/15  WBC 8.7  HGB 15.5  HCT 48.1  MCV 96.0  PLT 219   Cardiac Enzymes:  Recent Labs Lab 01/18/15 0512  TROPONINI 0.03    BNP (last 3 results)  Recent Labs  01/18/15  BNP 279.0*    ProBNP (last 3 results)  Recent Labs  06/26/14 0920  PROBNP 451.5*    CBG:  Recent Labs Lab 01/18/15 0229 01/18/15 0245 01/18/15 0416 01/18/15 0524 01/18/15 0625  GLUCAP 595* >600* 557* 467* 330*    Radiological Exams on Admission: Ct Head Wo Contrast  01/18/2015   CLINICAL DATA:  Altered mental status, hyperglycemia. History of diabetes, substance abuse, hypertension, hyperlipidemia.  EXAM: CT HEAD WITHOUT CONTRAST  TECHNIQUE: Contiguous axial images were obtained from the base of the skull through the vertex without intravenous contrast.  COMPARISON:  None.  FINDINGS: The ventricles and sulci are mildly prominent for age. No intraparenchymal hemorrhage, mass effect nor midline shift. No acute large  vascular territory infarcts.  No abnormal extra-axial fluid collections. Basal cisterns are patent.  No skull fracture. The included ocular globes and orbital contents are non-suspicious. LEFT ocular lens implant. RIGHT maxillary sinus mucosal thickening without paranasal sinus air-fluid levels. Small LEFT mastoid effusion.  IMPRESSION: No acute intracranial process.  Mild parenchymal brain volume loss for age.   Electronically Signed   By: Elon Alas   On: 01/18/2015 01:29   US Renal  01/18/2015   CLINICAL DATA:  Acute kidney injury.  EXAM: RENAL / URINARY TRACT ULTRASOUND COMPLETE  COMPARISON:  None.  FINDINGS: Right Kidney:  Length: 12.3 cm. Mild diffuse increased renal echogenicity. No mass or hydronephrosis visualized.  Left Kidney:  Length: 14.4 cm. Mild diffuse increased renal echogenicity. No hydronephrosis, there is borderline mild prominence of the interpolar calices. No cystic or solid lesion.  Bladder:  Distended with bladder volume of 622 cc. No bladder wall thickening or focal abnormality.  Other: Suspect trace perihepatic ascites.  IMPRESSION: 1. Echogenic kidneys consistent with chronic medical renal disease. Minimal prominence of the interpolar calices on the left without frank hydronephrosis. 2. Distended urinary bladder. 3. Question trace perihepatic ascites.   Electronically Signed   By: Jeb Levering M.D.   On: 01/18/2015 05:46   Dg Chest Port 1 View  01/18/2015   CLINICAL DATA:  Acute onset of worsening shortness of breath, lethargy and hyperglycemia. Initial encounter.  EXAM: PORTABLE CHEST - 1 VIEW  COMPARISON:  Chest radiograph performed 06/26/2014  FINDINGS: The lungs are well-aerated. Right upper lobe airspace opacification is compatible with pneumonia. Underlying vascular congestion is noted. No pleural effusion or pneumothorax is seen.  The cardiomediastinal silhouette is enlarged. No acute osseous abnormalities are seen.  IMPRESSION: 1. Right upper lobe pneumonia noted.  Followup PA and lateral chest X-ray is recommended in 3-4 weeks following trial of antibiotic therapy to ensure resolution and exclude underlying malignancy. 2. Underlying vascular congestion and cardiomegaly.   Electronically Signed   By: Garald Balding M.D.   On: 01/18/2015 00:59    EKG: Independently reviewed.  Abnormal findings:  Bundle blockage, QTC=552. I personally asked cardiologist, Dr. Tamala Julian to have reviewed the EKG, who think  ST change is from bundle blockage, not  STMEI.   Assessment/Plan Principal Problem:   CAP (community acquired pneumonia) Active Problems:   Lumbar spondylosis   Lumbar spinal stenosis   Insulin dependent type 2 diabetes mellitus, uncontrolled   Hypertension   Chronic low back pain   Smoker   Obstructive sleep apnea   Decreased peripheral vision of left eye   Proliferative diabetic retinopathy   Depression   Diastolic heart failure secondary to hypertension   Acute renal failure superimposed on stage 3 chronic kidney disease   Acute encephalopathy  CAP: Patient's productive cough and shortness of breath plus chest x-ray findings are consistent with CAP. Currently patient is not obviously septic. Hemodynamically stable. He is clinically dry secondary to hyperglycemia. - will admit to SDU - IV Rocephin and azithromycin - albuterol and duoneb Neb prn for SOB - Urine legionella and S. pneumococcal antigen - Follow up blood culture x2, sputum culture and respiratory virus panel - will get Procalcitonin and trend lactic acid level  - IVF: 2L of NS bolus in ED, followed by 125 mL per hour of NS (patient has a diastolic congestive heart failure, which limits aggressive IV fluids).  Hyperglycemia and DM-II: Patient is on Lantus 35 units twice a day at home. His A1c is 8.9 on 11/29/13, poorly controlled. His blood sugar is 784 on admission. Patient is clinically dehydrated and very dry.  -IVF as above -insulin gtt now -cbg q1h -decrease lantus to 20 units  bid  Acute encephalopathy:  likely due to CAP and possible HHS -will check Osmo -treat CAP as above -Frequent neuro checks -treat hyperglycemia as above  Diastolic congestive heart failure: 2-D echo on 11/29/13 showed EF 50-55% with grade 1 diastolic dysfunction. Patient is on Lasix 80-160 mg at a when necessary base. CHF is compensated on admission, actually he is clinically dry. BNP 279. -will hold lasix now -ASA -coreg -watch volume status closely  AoCKD-III: Baseline creatinine is 1.5, his creatinine is 3.59 and BUN 82 on admission. It is likely due to prerenal failure. -IVF as above -check FeUrea and US-renal  HTN: -will hold amlodipine since patient is also on verapamil -hold oral hydralazine since patient is at risk of developing sepsis and hypotension -Start when necessary IV hydralazine -Continue Coreg, verapamil  Depression: -Continue amitriptyline  DVT ppx: SQ Heparin     Code Status: Full code Family Communication: None at bed side.   Disposition Plan: Admit to inpatient   Date of Service 01/18/2015    Ivor Costa [facility] Pager [phone]  If 7PM-7AM, please contact night-coverage [email or web address] Password [email or web address] 01/18/2015, 6:39 AM

## 2015-01-19 ENCOUNTER — Inpatient Hospital Stay (HOSPITAL_COMMUNITY): Payer: Medicaid Other

## 2015-01-19 DIAGNOSIS — E1165 Type 2 diabetes mellitus with hyperglycemia: Secondary | ICD-10-CM

## 2015-01-19 DIAGNOSIS — Z794 Long term (current) use of insulin: Secondary | ICD-10-CM

## 2015-01-19 LAB — BASIC METABOLIC PANEL
Anion gap: 13 (ref 5–15)
Anion gap: 11 (ref 5–15)
BUN: 70 mg/dL — AB (ref 6–20)
BUN: 70 mg/dL — ABNORMAL HIGH (ref 6–20)
CALCIUM: 8.5 mg/dL — AB (ref 8.9–10.3)
CO2: 33 mmol/L — AB (ref 22–32)
CO2: 36 mmol/L — ABNORMAL HIGH (ref 22–32)
CREATININE: 2.98 mg/dL — AB (ref 0.61–1.24)
CREATININE: 2.96 mg/dL — AB (ref 0.61–1.24)
Calcium: 8.4 mg/dL — ABNORMAL LOW (ref 8.9–10.3)
Chloride: 95 mmol/L — ABNORMAL LOW (ref 101–111)
Chloride: 94 mmol/L — ABNORMAL LOW (ref 101–111)
GFR calc Af Amer: 27 mL/min — ABNORMAL LOW (ref >60)
GFR calc non Af Amer: 23 mL/min — ABNORMAL LOW (ref >60)
GFR, EST AFRICAN AMERICAN: 27 mL/min — AB (ref >60)
GFR, EST NON AFRICAN AMERICAN: 23 mL/min — AB (ref >60)
Glucose, Bld: 233 mg/dL — ABNORMAL HIGH (ref 65–99)
Glucose, Bld: 232 mg/dL — ABNORMAL HIGH (ref 65–99)
POTASSIUM: 3.6 mmol/L (ref 3.5–5.1)
POTASSIUM: 3.4 mmol/L — AB (ref 3.5–5.1)
SODIUM: 141 mmol/L (ref 135–145)
Sodium: 141 mmol/L (ref 135–145)

## 2015-01-19 LAB — CBC
HEMATOCRIT: 43.9 % (ref 39.0–52.0)
Hemoglobin: 13.4 g/dL (ref 13.0–17.0)
MCH: 29.8 pg (ref 26.0–34.0)
MCHC: 30.5 g/dL (ref 30.0–36.0)
MCV: 97.6 fL (ref 78.0–100.0)
Platelets: 230 10*3/uL (ref 150–400)
RBC: 4.5 MIL/uL (ref 4.22–5.81)
RDW: 14.5 % (ref 11.5–15.5)
WBC: 7.7 10*3/uL (ref 4.0–10.5)

## 2015-01-19 LAB — GLUCOSE, CAPILLARY
GLUCOSE-CAPILLARY: 237 mg/dL — AB (ref 65–99)
GLUCOSE-CAPILLARY: 323 mg/dL — AB (ref 65–99)
Glucose-Capillary: 172 mg/dL — ABNORMAL HIGH (ref 65–99)
Glucose-Capillary: 206 mg/dL — ABNORMAL HIGH (ref 65–99)
Glucose-Capillary: 267 mg/dL — ABNORMAL HIGH (ref 65–99)

## 2015-01-19 LAB — POCT I-STAT 3, ART BLOOD GAS (G3+)
Acid-Base Excess: 8 mmol/L — ABNORMAL HIGH (ref 0.0–2.0)
BICARBONATE: 37.7 meq/L — AB (ref 20.0–24.0)
O2 Saturation: 91 %
PCO2 ART: 76.2 mmHg — AB (ref 35.0–45.0)
Patient temperature: 98
TCO2: 40 mmol/L (ref 0–100)
pH, Arterial: 7.3 — ABNORMAL LOW (ref 7.350–7.450)
pO2, Arterial: 71 mmHg — ABNORMAL LOW (ref 80.0–100.0)

## 2015-01-19 LAB — UREA NITROGEN, URINE: Urea Nitrogen, Ur: 385 mg/dL

## 2015-01-19 LAB — MAGNESIUM: MAGNESIUM: 2.2 mg/dL (ref 1.7–2.4)

## 2015-01-19 LAB — PROCALCITONIN: Procalcitonin: 0.57 ng/mL

## 2015-01-19 MED ORDER — HYDRALAZINE HCL 50 MG PO TABS
100.0000 mg | ORAL_TABLET | Freq: Three times a day (TID) | ORAL | Status: DC
  Administered 2015-01-19 – 2015-01-22 (×10): 100 mg via ORAL
  Filled 2015-01-19 (×12): qty 2

## 2015-01-19 MED ORDER — HYDRALAZINE HCL 20 MG/ML IJ SOLN
10.0000 mg | INTRAMUSCULAR | Status: DC | PRN
  Administered 2015-01-19: 10 mg via INTRAVENOUS
  Administered 2015-01-20: 40 mg via INTRAVENOUS
  Administered 2015-01-20 (×2): 20 mg via INTRAVENOUS
  Administered 2015-01-20 – 2015-01-21 (×2): 40 mg via INTRAVENOUS
  Filled 2015-01-19: qty 2
  Filled 2015-01-19 (×3): qty 1
  Filled 2015-01-19 (×2): qty 2

## 2015-01-19 MED ORDER — CHLORHEXIDINE GLUCONATE 0.12 % MT SOLN
15.0000 mL | Freq: Two times a day (BID) | OROMUCOSAL | Status: DC
  Administered 2015-01-19: 15 mL via OROMUCOSAL
  Filled 2015-01-19: qty 15

## 2015-01-19 MED ORDER — CETYLPYRIDINIUM CHLORIDE 0.05 % MT LIQD
7.0000 mL | Freq: Two times a day (BID) | OROMUCOSAL | Status: DC
  Administered 2015-01-19 – 2015-01-21 (×6): 7 mL via OROMUCOSAL

## 2015-01-19 MED ORDER — INSULIN ASPART 100 UNIT/ML ~~LOC~~ SOLN
0.0000 [IU] | Freq: Three times a day (TID) | SUBCUTANEOUS | Status: DC
Start: 2015-01-19 — End: 2015-01-20
  Administered 2015-01-19: 3 [IU] via SUBCUTANEOUS
  Administered 2015-01-19: 5 [IU] via SUBCUTANEOUS
  Administered 2015-01-19: 2 [IU] via SUBCUTANEOUS

## 2015-01-19 MED ORDER — CARVEDILOL 25 MG PO TABS
25.0000 mg | ORAL_TABLET | Freq: Two times a day (BID) | ORAL | Status: DC
  Administered 2015-01-19 – 2015-01-22 (×7): 25 mg via ORAL
  Filled 2015-01-19 (×9): qty 1

## 2015-01-19 MED ORDER — INSULIN GLARGINE 100 UNIT/ML ~~LOC~~ SOLN
30.0000 [IU] | Freq: Two times a day (BID) | SUBCUTANEOUS | Status: DC
  Administered 2015-01-19 (×2): 30 [IU] via SUBCUTANEOUS
  Filled 2015-01-19 (×3): qty 0.3

## 2015-01-19 MED ORDER — POTASSIUM CHLORIDE 10 MEQ/100ML IV SOLN
10.0000 meq | INTRAVENOUS | Status: AC
  Administered 2015-01-19 (×4): 10 meq via INTRAVENOUS
  Filled 2015-01-19 (×4): qty 100

## 2015-01-19 MED ORDER — ASPIRIN 325 MG PO TABS
325.0000 mg | ORAL_TABLET | Freq: Every day | ORAL | Status: DC
  Administered 2015-01-19 – 2015-01-22 (×4): 325 mg via ORAL
  Filled 2015-01-19 (×4): qty 1

## 2015-01-19 MED ORDER — CETYLPYRIDINIUM CHLORIDE 0.05 % MT LIQD: 7.0000 mL | Freq: Two times a day (BID) | OROMUCOSAL | Status: DC

## 2015-01-19 NOTE — Progress Notes (Signed)
[place] Progress Note Patient Name: Gavynn Pole DOB: 07-28-1966 MRN: [id number]   Date of Service  01/19/2015  HPI/Events of Note  ABG on IPAP 22/EPAP 8 and Rate = 20 = 7.30/76.2/71.0/37.7  eICU Interventions  Continue present management.      Intervention Category Major Interventions: Respiratory failure - evaluation and management  Gracious Renken Eugene 01/19/2015, 5:24 AM

## 2015-01-19 NOTE — Progress Notes (Signed)
PRN Hydralazine given for B/P 183/86

## 2015-01-19 NOTE — Progress Notes (Signed)
[place] Progress Note Patient Name: Jerry Cantrell DOB: 1966-03-07 MRN: [id number]   Date of Service  01/19/2015  HPI/Events of Note  ABG on IPAP 22/EPAP 8 and rate 16 = 7.27/75/66/35  eICU Interventions  Will order: 1. Increase rate to 20. 2. Wean Precedex IV infusion - will decrease low infusion rate to 0.2. 3. ABG at 1:30 AM.      Intervention Category Major Interventions: Respiratory failure - evaluation and management  Sommer,Steven Eugene 01/19/2015, 12:12 AM

## 2015-01-19 NOTE — Progress Notes (Signed)
Agitated delirium last PM. Was on NIPPV for a period of time. Now comfortable on [place] O2. Apologetic. Has poor recal of events of last 24 hrs. No overt distress. No new complaints  Day 2 ceftriaxone/azithro   Filed Vitals:   01/19/15 0700 01/19/15 0736 01/19/15 0754 01/19/15 0800  BP:  165/98  175/91  Pulse: 65 66  62  Temp:  98.2 F (36.8 C)    TempSrc:  Oral    Resp: 18 13  17   Height:      Weight:      SpO2: 93% 95% 99% 94%   NAD Plethoric JVP not seen R> L crackles, no wheezes Reg, no M Obese, soft, +BS Ext warm, no edema  BMET    Component Value Date/Time   NA 141 01/19/2015 0250   K 3.4* 01/19/2015 0250   CL 94* 01/19/2015 0250   CO2 36* 01/19/2015 0250   GLUCOSE 232* 01/19/2015 0250   BUN 70* 01/19/2015 0250   CREATININE 2.96* 01/19/2015 0250   CREATININE 1.30 01/16/2014 0946   CALCIUM 8.4* 01/19/2015 0250   GFRNONAA 23* 01/19/2015 0250   GFRNONAA 72 06/30/2013 1038   GFRAA 27* 01/19/2015 0250   GFRAA 83 06/30/2013 1038    CBC    Component Value Date/Time   WBC 7.7 01/19/2015 0250   RBC 4.50 01/19/2015 0250   HGB 13.4 01/19/2015 0250   HCT 43.9 01/19/2015 0250   PLT 230 01/19/2015 0250   MCV 97.6 01/19/2015 0250   MCH 29.8 01/19/2015 0250   MCHC 30.5 01/19/2015 0250   RDW 14.5 01/19/2015 0250   LYMPHSABS 1.7 06/26/2014 0944   MONOABS 0.5 06/26/2014 0944   EOSABS 0.1 06/26/2014 0944   BASOSABS 0.0 06/26/2014 0944   PCT 0.75 > 0.57  CXR: increased R sided AS dz   IMPRESSION: Acute hypoxic resp failure due to CAP, NOS  Improving  CXR worse but attributed to "CXR lag" AKI, nonoliguric - Cr stable DM2 Htn Delirium, resolved 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 Please call if we can be of further assistance  Merton Border, MD ; [employer] [phone].  After 5:30 PM or weekends, call [phone]

## 2015-01-19 NOTE — Progress Notes (Signed)
[facility] Progress Note   Jerry Cantrell [id number] DOB: 23-Oct-1965 DOA: 01/17/2015 PCP: Jerry Mccreedy, MD  Brief narrative: Jerry Cantrell is a 49 year old male with past medical history of hypertension, hyperlipidemia, diabetes mellitus, depression, diastolic heart failure, CK D3 and chronic back pain who presented to the hospital with altered mental status cough and dyspnea. He was found to have a community-acquired pneumonia and due to respiratory distress was started on a BiPAP.   Subjective: Patient was evaluated this morning-he was sleepy but easily awakened and quite oriented today as compared to yesterday. He had no significant complaints. Specifically no complaints of cough shortness of breath chest pain nausea abdominal pain or diarrhea.  Assessment/Plan: CAP with acute respiratory failure -Right upper lobe pneumonia -has been transitioned from BiPAP to nasal cannula this AM -cont ceftriaxone and azithromycin -Urine strep negative -Legionella antigen pending -Albuterol when necessary -Repeat chest x-ray in 3-4 weeks to ensure there is clearing   Active Problems:   Acute encephalopathy -Secondary to sepsis and hypoxia- had increasing agitation last night and required Precedex infusion- now quite oriented and calm- does not recall events of the past couple of days   Insulin dependent type 2 diabetes mellitus, uncontrolled  Decreased peripheral vision of left eye  Proliferative diabetic retinopathy -Significantly elevated sugars noted in the ER with glucose being 784-required insulin infusion initially but was not in DKA -Lantus resumed - sugars stable -Continue sliding scale insulin   Hypertension -Place on IV Lopressor while on Bipap- transition back to oral meds and follow BP - takes Coreg, hydralazine, metolazone, furosemide and verapamil at home  Acute renal failure/chronic kidney disease stage III -Baseline creatinine 1.3-1.5-creatinine on admission  3.59 BUN of 82 - Likely secondary to diuretics in setting of sepsis - continue to hydrate and follow I&O closely- will decrease NS to 75 cc/hr - holding diuretics   Smoker -Nicotine patch applied - advised to quit   Obstructive sleep apnea -We'll need CPAP/ BiPAP at bedtime - PCCM recommending outpt sleep study   Depression -cont Elavil   Diastolic heart failure secondary to hypertension -Last echo in 4/15 revealed in EF of 50-55% and grade 1 diastolic dysfunction-diuretics on hold as he is dehydrated and in acute renal failure  Hypokalemia -Replacing-recheck tomorrow morning   Code Status: full code Family Communication:  Disposition Plan: cont to follow in SDU DVT prophylaxis: Heparin Consultants: Pulmonary /critical care Procedures:  Antibiotics: Anti-infectives    Start     Dose/Rate Route Frequency Ordered Stop   01/19/15 0000  azithromycin (ZITHROMAX) 500 mg in dextrose 5 % 250 mL IVPB     500 mg 250 mL/hr over 60 Minutes Intravenous Every 24 hours 01/18/15 0405     01/19/15 0000  cefTRIAXone (ROCEPHIN) 1 g in dextrose 5 % 50 mL IVPB     1 g 100 mL/hr over 30 Minutes Intravenous Every 24 hours 01/18/15 0405     01/18/15 0400  azithromycin (ZITHROMAX) 500 mg in dextrose 5 % 250 mL IVPB  Status:  Discontinued     500 mg 250 mL/hr over 60 Minutes Intravenous Every 24 hours 01/18/15 0359 01/18/15 0405   01/18/15 0400  cefTRIAXone (ROCEPHIN) 1 g in dextrose 5 % 50 mL IVPB  Status:  Discontinued     1 g 100 mL/hr over 30 Minutes Intravenous Every 24 hours 01/18/15 0359 01/18/15 0405   01/18/15 0115  cefTRIAXone (ROCEPHIN) 1 g in dextrose 5 % 50 mL IVPB     1 g 100 mL/hr over  30 Minutes Intravenous  Once 01/18/15 0107 01/18/15 0245   01/18/15 0115  azithromycin (ZITHROMAX) 500 mg in dextrose 5 % 250 mL IVPB     500 mg 250 mL/hr over 60 Minutes Intravenous  Once 01/18/15 0107 01/18/15 0318      Objective: Filed Weights   01/18/15 1030  Weight: 132 kg (291 lb  0.1 oz)    Intake/Output Summary (Last 24 hours) at 01/19/15 1033 Last data filed at 01/19/15 0804  Gross per 24 hour  Intake 4821.42 ml  Output   2195 ml  Net 2626.42 ml     Vitals Filed Vitals:   01/19/15 0736 01/19/15 0754 01/19/15 0800 01/19/15 0900  BP: 165/98  175/91 178/105  Pulse: 66  62 68  Temp: 98.2 F (36.8 C)     TempSrc: Oral     Resp: 13  17 14   Height:      Weight:      SpO2: 95% 99% 94% 89%    Exam:  General:  Pt is alert, not in acute distress  HEENT: No icterus, No thrush  Cardiovascular: regular rate and rhythm, S1/S2 No murmur  Respiratory: Mild basilar crackles bilaterally- was on 100% face max when I evaluated him with an oxygen saturation of 100%  Abdomen: Soft, +Bowel sounds, non tender, non distended, no guarding  MSK: No LE edema, cyanosis or clubbing  Data Reviewed: Basic Metabolic Panel:  Recent Labs Lab 01/18/15 01/18/15 0822 01/18/15 1656 01/18/15 2309 01/19/15 0250  NA 131* 138 131* 141 141  K 3.8 3.4* >7.5* 3.6 3.4*  CL 82* 93* 95* 95* 94*  CO2 34* 32 31 33* 36*  GLUCOSE 784* 243* 193* 233* 232*  BUN 82* 76* 70* 70* 70*  CREATININE 3.59* 3.33* 2.85* 2.98* 2.96*  CALCIUM 9.0 8.8* 7.9* 8.5* 8.4*   Liver Function Tests: No results for input(s): AST, ALT, ALKPHOS, BILITOT, PROT, ALBUMIN in the last 168 hours. No results for input(s): LIPASE, AMYLASE in the last 168 hours. No results for input(s): AMMONIA in the last 168 hours. CBC:  Recent Labs Lab 01/18/15 01/19/15 0250  WBC 8.7 7.7  HGB 15.5 13.4  HCT 48.1 43.9  MCV 96.0 97.6  PLT 219 230   Cardiac Enzymes:  Recent Labs Lab 01/18/15 0512 01/18/15 1100  TROPONINI 0.03 <0.03   BNP (last 3 results)  Recent Labs  01/18/15  BNP 279.0*    ProBNP (last 3 results)  Recent Labs  06/26/14 0920  PROBNP 451.5*    CBG:  Recent Labs Lab 01/18/15 1210 01/18/15 1413 01/18/15 1641 01/18/15 1952 01/19/15 0018  GLUCAP 146* 179* 204* 234* 206*     Recent Results (from the past 240 hour(s))  Blood culture (routine x 2)     Status: None (Preliminary result)   Collection Time: 01/18/15  1:15 AM  Result Value Ref Range Status   Specimen Description BLOOD RIGHT ANTECUBITAL  Final   Special Requests BOTTLES DRAWN AEROBIC AND ANAEROBIC 5CC  Final   Culture   Final           BLOOD CULTURE RECEIVED NO GROWTH TO DATE CULTURE WILL BE HELD FOR 5 DAYS BEFORE ISSUING A FINAL NEGATIVE REPORT Performed at [facility]    Report Status PENDING  Incomplete  Blood culture (routine x 2)     Status: None (Preliminary result)   Collection Time: 01/18/15  1:35 AM  Result Value Ref Range Status   Specimen Description BLOOD RIGHT HAND  Final  Special Requests BOTTLES DRAWN AEROBIC AND ANAEROBIC 4CC  Final   Culture   Final           BLOOD CULTURE RECEIVED NO GROWTH TO DATE CULTURE WILL BE HELD FOR 5 DAYS BEFORE ISSUING A FINAL NEGATIVE REPORT Performed at [facility]    Report Status PENDING  Incomplete  MRSA PCR Screening     Status: None   Collection Time: 01/18/15 10:56 AM  Result Value Ref Range Status   MRSA by PCR NEGATIVE NEGATIVE Final    Comment:        The GeneXpert MRSA Assay (FDA approved for NASAL specimens only), is one component of a comprehensive MRSA colonization surveillance program. It is not intended to diagnose MRSA infection nor to guide or monitor treatment for MRSA infections.      Studies: Ct Head Wo Contrast  01/18/2015   CLINICAL DATA:  Altered mental status, hyperglycemia. History of diabetes, substance abuse, hypertension, hyperlipidemia.  EXAM: CT HEAD WITHOUT CONTRAST  TECHNIQUE: Contiguous axial images were obtained from the base of the skull through the vertex without intravenous contrast.  COMPARISON:  None.  FINDINGS: The ventricles and sulci are mildly prominent for age. No intraparenchymal hemorrhage, mass effect nor midline shift. No acute large vascular territory infarcts.  No  abnormal extra-axial fluid collections. Basal cisterns are patent.  No skull fracture. The included ocular globes and orbital contents are non-suspicious. LEFT ocular lens implant. RIGHT maxillary sinus mucosal thickening without paranasal sinus air-fluid levels. Small LEFT mastoid effusion.  IMPRESSION: No acute intracranial process.  Mild parenchymal brain volume loss for age.   Electronically Signed   By: Elon Alas   On: 01/18/2015 01:29   US Renal  01/18/2015   CLINICAL DATA:  Acute kidney injury.  EXAM: RENAL / URINARY TRACT ULTRASOUND COMPLETE  COMPARISON:  None.  FINDINGS: Right Kidney:  Length: 12.3 cm. Mild diffuse increased renal echogenicity. No mass or hydronephrosis visualized.  Left Kidney:  Length: 14.4 cm. Mild diffuse increased renal echogenicity. No hydronephrosis, there is borderline mild prominence of the interpolar calices. No cystic or solid lesion.  Bladder:  Distended with bladder volume of 622 cc. No bladder wall thickening or focal abnormality.  Other: Suspect trace perihepatic ascites.  IMPRESSION: 1. Echogenic kidneys consistent with chronic medical renal disease. Minimal prominence of the interpolar calices on the left without frank hydronephrosis. 2. Distended urinary bladder. 3. Question trace perihepatic ascites.   Electronically Signed   By: Jeb Levering M.D.   On: 01/18/2015 05:46   Dg Chest Port 1 View  01/18/2015   CLINICAL DATA:  Acute onset of worsening shortness of breath, lethargy and hyperglycemia. Initial encounter.  EXAM: PORTABLE CHEST - 1 VIEW  COMPARISON:  Chest radiograph performed 06/26/2014  FINDINGS: The lungs are well-aerated. Right upper lobe airspace opacification is compatible with pneumonia. Underlying vascular congestion is noted. No pleural effusion or pneumothorax is seen.  The cardiomediastinal silhouette is enlarged. No acute osseous abnormalities are seen.  IMPRESSION: 1. Right upper lobe pneumonia noted. Followup PA and lateral chest  X-ray is recommended in 3-4 weeks following trial of antibiotic therapy to ensure resolution and exclude underlying malignancy. 2. Underlying vascular congestion and cardiomegaly.   Electronically Signed   By: Garald Balding M.D.   On: 01/18/2015 00:59    Scheduled Meds:  Scheduled Meds: . albuterol  2.5 mg Nebulization Q4H  . amitriptyline  50 mg Oral QHS  . antiseptic oral rinse  7 mL Mouth Rinse  q12n4p  . aspirin  325 mg Oral Daily  . azithromycin  500 mg Intravenous Q24H  . carvedilol  25 mg Oral BID WC  . cefTRIAXone (ROCEPHIN)  IV  1 g Intravenous Q24H  . chlorhexidine  15 mL Mouth Rinse BID  . heparin  5,000 Units Subcutaneous 3 times per day  . hydrALAZINE  100 mg Oral TID  . insulin aspart  0-9 Units Subcutaneous TID WC  . insulin glargine  30 Units Subcutaneous BID  . nicotine  21 mg Transdermal Daily  . omega-3 acid ethyl esters  2 g Oral BID  . pneumococcal 23 valent vaccine  0.5 mL Intramuscular Tomorrow-1000  . potassium chloride  10 mEq Intravenous Q1 Hr x 4   Continuous Infusions: . sodium chloride 75 mL/hr (01/19/15 0807)    Time spent on care of this patient: 35 minutes   Dalzell, MD 01/19/2015, 10:33 AM  LOS: 1 day   [facility] Office  [phone] Pager - Text Page per [email or web address]  If 7PM-7AM, please contact night-coverage [email or web address]

## 2015-01-19 NOTE — Plan of Care (Signed)
Problem: Phase I Progression Outcomes Goal: Dyspnea controlled at rest Outcome: Not Progressing Currently on Bipap.

## 2015-01-20 LAB — GLUCOSE, CAPILLARY
Glucose-Capillary: 414 mg/dL — ABNORMAL HIGH (ref 65–99)
Glucose-Capillary: 321 mg/dL — ABNORMAL HIGH (ref 65–99)
Glucose-Capillary: 226 mg/dL — ABNORMAL HIGH (ref 65–99)
Glucose-Capillary: 291 mg/dL — ABNORMAL HIGH (ref 65–99)

## 2015-01-20 LAB — BASIC METABOLIC PANEL
Anion gap: 10 (ref 5–15)
BUN: 54 mg/dL — AB (ref 6–20)
CALCIUM: 8.7 mg/dL — AB (ref 8.9–10.3)
CO2: 33 mmol/L — ABNORMAL HIGH (ref 22–32)
CREATININE: 2.02 mg/dL — AB (ref 0.61–1.24)
Chloride: 96 mmol/L — ABNORMAL LOW (ref 101–111)
GFR calc non Af Amer: 37 mL/min — ABNORMAL LOW (ref >60)
GFR, EST AFRICAN AMERICAN: 43 mL/min — AB (ref >60)
GLUCOSE: 404 mg/dL — AB (ref 65–99)
Potassium: 3.5 mmol/L (ref 3.5–5.1)
SODIUM: 139 mmol/L (ref 135–145)

## 2015-01-20 MED ORDER — SODIUM CHLORIDE 0.9 % IV SOLN
INTRAVENOUS | Status: DC
  Administered 2015-01-20 – 2015-01-21 (×2): via INTRAVENOUS

## 2015-01-20 MED ORDER — VERAPAMIL HCL ER 120 MG PO TBCR: 120.0000 mg | EXTENDED_RELEASE_TABLET | Freq: Every day | ORAL | Status: DC

## 2015-01-20 MED ORDER — INSULIN GLARGINE 100 UNIT/ML ~~LOC~~ SOLN
35.0000 [IU] | Freq: Two times a day (BID) | SUBCUTANEOUS | Status: DC
  Administered 2015-01-20 (×2): 35 [IU] via SUBCUTANEOUS
  Filled 2015-01-20 (×3): qty 0.35

## 2015-01-20 MED ORDER — AMLODIPINE BESYLATE 10 MG PO TABS
10.0000 mg | ORAL_TABLET | Freq: Every day | ORAL | Status: DC
  Administered 2015-01-20 – 2015-01-22 (×3): 10 mg via ORAL
  Filled 2015-01-20 (×3): qty 1

## 2015-01-20 MED ORDER — VERAPAMIL HCL ER 120 MG PO TBCR
120.0000 mg | EXTENDED_RELEASE_TABLET | Freq: Every day | ORAL | Status: DC
  Administered 2015-01-20 – 2015-01-22 (×3): 120 mg via ORAL
  Filled 2015-01-20 (×3): qty 1

## 2015-01-20 MED ORDER — INSULIN ASPART 100 UNIT/ML ~~LOC~~ SOLN
0.0000 [IU] | Freq: Every day | SUBCUTANEOUS | Status: DC
  Administered 2015-01-20: 3 [IU] via SUBCUTANEOUS
  Administered 2015-01-21: 2 [IU] via SUBCUTANEOUS

## 2015-01-20 MED ORDER — INSULIN ASPART 100 UNIT/ML ~~LOC~~ SOLN
0.0000 [IU] | Freq: Three times a day (TID) | SUBCUTANEOUS | Status: DC
  Administered 2015-01-20: 20 [IU] via SUBCUTANEOUS
  Administered 2015-01-20: 7 [IU] via SUBCUTANEOUS
  Administered 2015-01-20: 20 [IU] via SUBCUTANEOUS
  Administered 2015-01-21 (×3): 11 [IU] via SUBCUTANEOUS

## 2015-01-20 NOTE — Progress Notes (Signed)
[facility] Progress Note   Jerry Cantrell [id number] DOB: [birth year] DOA: 01/17/2015 PCP: Benito Mccreedy, MD  Brief narrative: Jerry Cantrell is a 49 year old male with past medical history of hypertension, hyperlipidemia, diabetes mellitus, depression, diastolic heart failure, CK D3 and chronic back pain who presented to the hospital with altered mental status cough and dyspnea. He was found to have a community-acquired pneumonia and due to respiratory distress was started on a BiPAP.   Subjective: Patient was evaluated earlier this morning. He was awake and alert. No complaints of cough. Has some mild shortness of breath when he moves around. No chest pain nausea vomiting or diarrhea. He is asking for the Foley catheter to be removed.  Assessment/Plan: CAP with acute respiratory failure -Right upper lobe pneumonia -has been transitioned from BiPAP to nasal cannula - currently requiring about 5 L -cont ceftriaxone and azithromycin -Urine strep negative -Legionella antigen still pending -Albuterol when necessary -Repeat chest x-ray in 3-4 weeks to ensure there is clearing   Active Problems:   Acute encephalopathy -Secondary to sepsis and hypoxia- had increasing agitation on the night of 5/20 and required Precedex infusion- now quite oriented and calm- does not recall events related to admission   Insulin dependent type 2 diabetes mellitus, uncontrolled  Decreased peripheral vision of left eye  Proliferative diabetic retinopathy -Significantly elevated sugars noted in the ER with glucose being 784-required insulin infusion initially but was not in DKA -Lantus resumed - sugars slightly elevated today-will increase Lantus and mealtime insulin   Hypertension -Placed on IV Lopressor while on Bipap- transition back to oral meds and follow BP - takes Coreg, hydralazine, metolazone, furosemide and verapamil at home - Frusemide on hold due to acute renal failure   Acute  renal failure/chronic kidney disease stage III -Baseline creatinine 1.3-1.5-creatinine on admission 3.59 BUN of 82- has been improving steadily - Likely secondary to diuretics in setting of sepsis - holding diuretics -We'll decrease normal saline to 50 mL an hour today   Smoker -Nicotine patch applied - advised to quit   Obstructive sleep apnea -We'll need a sleep study as outpatient-a sleep study was was previously ordered but he was not able to make it to have it done -We'll need CPAP/ BiPAP at bedtime    Depression -cont Elavil   Diastolic heart failure secondary to hypertension -Last echo in 4/15 revealed in EF of 50-55% and grade 1 diastolic dysfunction-diuretics on hold as he is dehydrated and in acute renal failure -Echo repeated -  report mentioned below- no mention of diastolic dysfunction, questionable if the patient needs to be on chronic diuretics  Hypokalemia -Replaced   Code Status: full code Family Communication:  Disposition Plan: cont to follow in SDU- does not have a PCP and will have to arrange for him to follow-up at the [place] [facility] DVT prophylaxis: Heparin Consultants: Pulmonary /critical care Procedures: 2-D echo EF 50-55%,no regional wall motion abnormalities, left atrium mildly dilated, atrial septum reveals increased thickness consistent with lipomatous hypertrophy- diastolic function was not mentioned  Antibiotics: Anti-infectives    Start     Dose/Rate Route Frequency Ordered Stop   01/19/15 0000  azithromycin (ZITHROMAX) 500 mg in dextrose 5 % 250 mL IVPB     500 mg 250 mL/hr over 60 Minutes Intravenous Every 24 hours 01/18/15 0405     01/19/15 0000  cefTRIAXone (ROCEPHIN) 1 g in dextrose 5 % 50 mL IVPB     1 g 100 mL/hr over 30 Minutes Intravenous Every 24 hours  01/18/15 0405     01/18/15 0400  azithromycin (ZITHROMAX) 500 mg in dextrose 5 % 250 mL IVPB  Status:  Discontinued     500 mg 250 mL/hr over 60 Minutes Intravenous Every 24  hours 01/18/15 0359 01/18/15 0405   01/18/15 0400  cefTRIAXone (ROCEPHIN) 1 g in dextrose 5 % 50 mL IVPB  Status:  Discontinued     1 g 100 mL/hr over 30 Minutes Intravenous Every 24 hours 01/18/15 0359 01/18/15 0405   01/18/15 0115  cefTRIAXone (ROCEPHIN) 1 g in dextrose 5 % 50 mL IVPB     1 g 100 mL/hr over 30 Minutes Intravenous  Once 01/18/15 0107 01/18/15 0245   01/18/15 0115  azithromycin (ZITHROMAX) 500 mg in dextrose 5 % 250 mL IVPB     500 mg 250 mL/hr over 60 Minutes Intravenous  Once 01/18/15 0107 01/18/15 0318      Objective: Filed Weights   01/18/15 1030  Weight: 132 kg (291 lb 0.1 oz)    Intake/Output Summary (Last 24 hours) at 01/20/15 1000 Last data filed at 01/20/15 0800  Gross per 24 hour  Intake   3190 ml  Output   3600 ml  Net   -410 ml     Vitals Filed Vitals:   01/20/15 0800 01/20/15 0811 01/20/15 0842 01/20/15 0900  BP: 173/82 173/82  150/78  Pulse: 73   70  Temp: 98.6 F (37 C)     TempSrc: Oral     Resp: 14   11  Height:      Weight:      SpO2: 93%  95% 94%    Exam:  General:  Pt is alert, not in acute distress  HEENT: No icterus, No thrush  Cardiovascular: regular rate and rhythm, S1/S2 No murmur  Respiratory: Mild basilar crackles bilaterally- on 5 L nasal cannula at this time   Abdomen: Soft, +Bowel sounds, non tender, non distended, no guarding  MSK: No LE edema, cyanosis or clubbing  Data Reviewed: Basic Metabolic Panel:  Recent Labs Lab 01/18/15 0822 01/18/15 1656 01/18/15 2309 01/19/15 0250 01/19/15 1105 01/20/15 0828  NA 138 131* 141 141  --  139  K 3.4* >7.5* 3.6 3.4*  --  3.5  CL 93* 95* 95* 94*  --  96*  CO2 32 31 33* 36*  --  33*  GLUCOSE 243* 193* 233* 232*  --  404*  BUN 76* 70* 70* 70*  --  54*  CREATININE 3.33* 2.85* 2.98* 2.96*  --  2.02*  CALCIUM 8.8* 7.9* 8.5* 8.4*  --  8.7*  MG  --   --   --   --  2.2  --    Liver Function Tests: No results for input(s): AST, ALT, ALKPHOS, BILITOT, PROT, ALBUMIN  in the last 168 hours. No results for input(s): LIPASE, AMYLASE in the last 168 hours. No results for input(s): AMMONIA in the last 168 hours. CBC:  Recent Labs Lab 01/18/15 01/19/15 0250  WBC 8.7 7.7  HGB 15.5 13.4  HCT 48.1 43.9  MCV 96.0 97.6  PLT 219 230   Cardiac Enzymes:  Recent Labs Lab 01/18/15 0512 01/18/15 1100  TROPONINI 0.03 <0.03   BNP (last 3 results)  Recent Labs  01/18/15  BNP 279.0*    ProBNP (last 3 results)  Recent Labs  06/26/14 0920  PROBNP 451.5*    CBG:  Recent Labs Lab 01/19/15 0734 01/19/15 1126 01/19/15 1734 01/19/15 2133 01/20/15 0729  GLUCAP 172* 237*  267* 323* 414*    Recent Results (from the past 240 hour(s))  Blood culture (routine x 2)     Status: None (Preliminary result)   Collection Time: 01/18/15  1:15 AM  Result Value Ref Range Status   Specimen Description BLOOD RIGHT ANTECUBITAL  Final   Special Requests BOTTLES DRAWN AEROBIC AND ANAEROBIC 5CC  Final   Culture   Final           BLOOD CULTURE RECEIVED NO GROWTH TO DATE CULTURE WILL BE HELD FOR 5 DAYS BEFORE ISSUING A FINAL NEGATIVE REPORT Performed at [facility]    Report Status PENDING  Incomplete  Blood culture (routine x 2)     Status: None (Preliminary result)   Collection Time: 01/18/15  1:35 AM  Result Value Ref Range Status   Specimen Description BLOOD RIGHT HAND  Final   Special Requests BOTTLES DRAWN AEROBIC AND ANAEROBIC 4CC  Final   Culture   Final           BLOOD CULTURE RECEIVED NO GROWTH TO DATE CULTURE WILL BE HELD FOR 5 DAYS BEFORE ISSUING A FINAL NEGATIVE REPORT Performed at [facility]    Report Status PENDING  Incomplete  MRSA PCR Screening     Status: None   Collection Time: 01/18/15 10:56 AM  Result Value Ref Range Status   MRSA by PCR NEGATIVE NEGATIVE Final    Comment:        The GeneXpert MRSA Assay (FDA approved for NASAL specimens only), is one component of a comprehensive MRSA colonization surveillance  program. It is not intended to diagnose MRSA infection nor to guide or monitor treatment for MRSA infections.      Studies: Dg Chest Port 1 View  01/19/2015   CLINICAL DATA:  Followup pneumonia.  EXAM: PORTABLE CHEST - 1 VIEW  COMPARISON:  Yesterday.  FINDINGS: Significant right upper lobe airspace opacity with mild improvement. Interval linear density in the left lower lung zone. Stable enlarged cardiac silhouette. Linear densities an lucencies along the left upper heart border, suggesting the possibility of a pneumomediastinum on 1 of the views. This is not seen on the other view, and therefore is most likely artifactual. Lower thoracic spine degenerative changes.  IMPRESSION: 1. Mildly improved right upper lobe pneumonia. 2. Interval mild left lower lung zone linear atelectasis. 3. Probable artifact overlying the left upper heart border on one of the views.   Electronically Signed   By: Claudie Revering M.D.   On: 01/19/2015 10:35    Scheduled Meds:  Scheduled Meds: . albuterol  2.5 mg Nebulization Q4H  . amitriptyline  50 mg Oral QHS  . amLODipine  10 mg Oral Daily  . antiseptic oral rinse  7 mL Mouth Rinse q12n4p  . aspirin  325 mg Oral Daily  . azithromycin  500 mg Intravenous Q24H  . carvedilol  25 mg Oral BID WC  . cefTRIAXone (ROCEPHIN)  IV  1 g Intravenous Q24H  . heparin  5,000 Units Subcutaneous 3 times per day  . hydrALAZINE  100 mg Oral TID  . insulin aspart  0-20 Units Subcutaneous TID WC  . insulin aspart  0-5 Units Subcutaneous QHS  . insulin glargine  35 Units Subcutaneous BID  . nicotine  21 mg Transdermal Daily  . omega-3 acid ethyl esters  2 g Oral BID  . verapamil  120 mg Oral Daily   Continuous Infusions:    Time spent on care of this patient: 35 minutes  Debbe Odea, MD 01/20/2015, 10:00 AM  LOS: 2 days   [facility] Office  [phone] Pager - Text Page per [email or web address]  If 7PM-7AM, please contact  night-coverage [email or web address]

## 2015-01-20 NOTE — Progress Notes (Signed)
Notified Dr. Wynelle Cleveland of CBG of 414. New orders received. Lab orders in for now to verify. Will continue to monitor closely.

## 2015-01-21 LAB — BASIC METABOLIC PANEL
Anion gap: 12 (ref 5–15)
BUN: 49 mg/dL — AB (ref 6–20)
CO2: 33 mmol/L — ABNORMAL HIGH (ref 22–32)
CREATININE: 1.92 mg/dL — AB (ref 0.61–1.24)
Calcium: 8.6 mg/dL — ABNORMAL LOW (ref 8.9–10.3)
Chloride: 96 mmol/L — ABNORMAL LOW (ref 101–111)
GFR calc Af Amer: 46 mL/min — ABNORMAL LOW (ref >60)
GFR calc non Af Amer: 39 mL/min — ABNORMAL LOW (ref >60)
GLUCOSE: 305 mg/dL — AB (ref 65–99)
POTASSIUM: 3.2 mmol/L — AB (ref 3.5–5.1)
Sodium: 141 mmol/L (ref 135–145)

## 2015-01-21 LAB — GLUCOSE, CAPILLARY
GLUCOSE-CAPILLARY: 280 mg/dL — AB (ref 65–99)
Glucose-Capillary: 257 mg/dL — ABNORMAL HIGH (ref 65–99)
Glucose-Capillary: 278 mg/dL — ABNORMAL HIGH (ref 65–99)
Glucose-Capillary: 226 mg/dL — ABNORMAL HIGH (ref 65–99)

## 2015-01-21 LAB — LEGIONELLA ANTIGEN, URINE

## 2015-01-21 LAB — PROCALCITONIN: Procalcitonin: 0.15 ng/mL

## 2015-01-21 MED ORDER — CLONIDINE HCL 0.2 MG PO TABS
0.2000 mg | ORAL_TABLET | Freq: Three times a day (TID) | ORAL | Status: DC
  Administered 2015-01-21 – 2015-01-22 (×2): 0.2 mg via ORAL
  Filled 2015-01-21 (×4): qty 1

## 2015-01-21 MED ORDER — IPRATROPIUM-ALBUTEROL 0.5-2.5 (3) MG/3ML IN SOLN
3.0000 mL | Freq: Three times a day (TID) | RESPIRATORY_TRACT | Status: DC
  Filled 2015-01-21: qty 3

## 2015-01-21 MED ORDER — ISOSORBIDE MONONITRATE ER 30 MG PO TB24
30.0000 mg | ORAL_TABLET | Freq: Every day | ORAL | Status: DC
  Administered 2015-01-21: 30 mg via ORAL
  Filled 2015-01-21: qty 1

## 2015-01-21 MED ORDER — ALBUTEROL SULFATE (2.5 MG/3ML) 0.083% IN NEBU
2.5000 mg | INHALATION_SOLUTION | RESPIRATORY_TRACT | Status: DC | PRN
  Administered 2015-01-21 – 2015-01-22 (×2): 2.5 mg via RESPIRATORY_TRACT
  Filled 2015-01-21 (×2): qty 3

## 2015-01-21 MED ORDER — POTASSIUM CHLORIDE CRYS ER 20 MEQ PO TBCR
40.0000 meq | EXTENDED_RELEASE_TABLET | ORAL | Status: AC
  Administered 2015-01-21 (×2): 40 meq via ORAL
  Filled 2015-01-21 (×2): qty 2

## 2015-01-21 MED ORDER — INSULIN GLARGINE 100 UNIT/ML ~~LOC~~ SOLN
40.0000 [IU] | Freq: Two times a day (BID) | SUBCUTANEOUS | Status: DC
  Administered 2015-01-21 – 2015-01-22 (×3): 40 [IU] via SUBCUTANEOUS
  Filled 2015-01-21 (×4): qty 0.4

## 2015-01-21 NOTE — Progress Notes (Signed)
Notified RT of O2 sats dropping to mid 80's and SB episode, requested pt to be placed on CPAP.

## 2015-01-21 NOTE — Progress Notes (Signed)
Physical Therapy Evaluation Patient Details Name: Jerry Cantrell MRN: [id number] DOB: 17-Sep-1965 Today's Date: 01/21/2015   History of Present Illness  Pt admitted with AMS and dyspnea due to CAP requiring bipap.  PMH: HTN, DM, depression, CHF.  Clinical Impression  Pt admitted with above diagnosis. Pt currently with functional limitations due to the deficits listed below (see PT Problem List). Pt able to ambulate with RW.  Uses cane at baseline.  Will benefit from PT on acute and HHPT safety eval.  Has RW he can use at home.   Pt will benefit from skilled PT to increase their independence and safety with mobility to allow discharge to the venue listed below.      Follow Up Recommendations Home health PT;Supervision/Assistance - 24 hour    Equipment Recommendations  None recommended by PT    Recommendations for Other Services       Precautions / Restrictions Precautions Precautions: Fall Restrictions Weight Bearing Restrictions: No      Mobility  Bed Mobility               General bed mobility comments: pt up in chair  Transfers Overall transfer level: Needs assistance Equipment used: Rolling walker (2 wheeled) Transfers: Sit to/from Stand Sit to Stand: Supervision            Ambulation/Gait Ambulation/Gait assistance: Supervision;+2 safety/equipment Ambulation Distance (Feet): 500 Feet Assistive device: Rolling walker (2 wheeled) Gait Pattern/deviations: Step-through pattern;Decreased stride length   Gait velocity interpretation: Below normal speed for age/gender General Gait Details: Pt felt more comfortable with RW as he was reaching for furniture without it.  Was not heavily reliant on RW but it did steady pt and make him feel more comfortable.  No LOB with activity today.    Stairs         General stair comments: Declined practice on steps  Wheelchair Mobility    Modified Rankin (Stroke Patients Only)       Balance Overall balance assessment:  Needs assistance Sitting-balance support: No upper extremity supported;Feet supported Sitting balance-Leahy Scale: Good     Standing balance support: Bilateral upper extremity supported;During functional activity Standing balance-Leahy Scale: Fair Standing balance comment: Some reliance on RW.                              Pertinent Vitals/Pain Pain Assessment: No/denies pain  Hr 73 bpm, 95% on 4LO2.  Removed O2 for ambulation and with good breathing techniques, sats stayed >90%.  Called nursing who agreed O2 to could stay off.  RA sat on departure 93% and >.  160/79 BP.      Home Living Family/patient expects to be discharged to:: Private residence Living Arrangements: Alone Available Help at Discharge: Family;Friend(s);Available 24 hours/day Type of Home: Apartment Home Access: Stairs to enter Entrance Stairs-Rails: Right;Left;Can reach both Entrance Stairs-Number of Steps: 3 Home Layout: Two level Home Equipment: Cane - single point;Walker - 2 wheels      Prior Function Level of Independence: Independent with assistive device(s)         Comments: uses cane when his back is painful     Hand Dominance   Dominant Hand: Right    Extremity/Trunk Assessment   Upper Extremity Assessment: Overall WFL for tasks assessed           Lower Extremity Assessment: Overall WFL for tasks assessed         Communication   Communication: No difficulties  Cognition Arousal/Alertness: Awake/alert Behavior During Therapy: WFL for tasks assessed/performed Overall Cognitive Status: Within Functional Limits for tasks assessed                      General Comments      Exercises        Assessment/Plan    PT Assessment Patient needs continued PT services  PT Diagnosis Generalized weakness   PT Problem List Decreased balance;Decreased mobility;Decreased activity tolerance;Decreased knowledge of use of DME;Decreased safety awareness;Decreased knowledge  of precautions  PT Treatment Interventions DME instruction;Gait training;Functional mobility training;Therapeutic activities;Therapeutic exercise;Balance training;Patient/family education   PT Goals (Current goals can be found in the Care Plan section) Acute Rehab PT Goals Patient Stated Goal: return home PT Goal Formulation: With patient Time For Goal Achievement: 02/04/15 Potential to Achieve Goals: Good    Frequency Min 3X/week   Barriers to discharge        Co-evaluation PT/OT/SLP Co-Evaluation/Treatment: Yes Reason for Co-Treatment: For patient/therapist safety PT goals addressed during session: Mobility/safety with mobility         End of Session Equipment Utilized During Treatment: Gait belt Activity Tolerance: Patient limited by fatigue Patient left: with call bell/phone within reach (left in bathroom to call nursing when done) Nurse Communication: Mobility status (let her know that pt left in bathroom and will call.)         Time: 1050-1108 PT Time Calculation (min) (ACUTE ONLY): 18 min   Charges:   PT Evaluation $Initial PT Evaluation Tier I: 1 Procedure     PT G CodesDenice Paradise 01/25/2015, 2:04 PM Merly Hinkson [facility] Acute Rehabilitation [phone] (pager)

## 2015-01-21 NOTE — Progress Notes (Signed)
Noted at 1337 pt's HR was 33.  Pt's O2 sats were 85 % on room air. Pt has noted sleep apnea. Pt was asleep at the time. Episode approx 3 seconds. Pt awakened easily and O2 placed at 2 L per nasal cannula. Will continue to monitor pt.

## 2015-01-21 NOTE — Evaluation (Signed)
Occupational Therapy Evaluation Patient Details Name: Jerry Cantrell MRN: [id number] DOB: [birth year] Today's Date: 01/21/2015    History of Present Illness Pt admitted with AMS and dyspnea due to CAP requiring bipap.  PMH: HTN, DM, depression, CHF.   Clinical Impression   Pt was performing ADL and ADL transfers at a modified independent level.  Pt presents with low vision, impaired balance and decreased activity tolerance interfering with ability to perform ADL and ADL transfers.  Educated in breathing techniques and pacing with pt overall performing at a supervision level.  Will follow acutely.    Follow Up Recommendations  Home health OT    Equipment Recommendations  Tub/shower seat (with a back)    Recommendations for Other Services       Precautions / Restrictions Precautions Precautions: Fall Restrictions Weight Bearing Restrictions: No      Mobility Bed Mobility               General bed mobility comments: pt up in chair  Transfers Overall transfer level: Needs assistance Equipment used: Rolling walker (2 wheeled) Transfers: Sit to/from Stand Sit to Stand: Supervision              Balance Overall balance assessment: Needs assistance Sitting-balance support: Feet supported Sitting balance-Leahy Scale: Good       Standing balance-Leahy Scale: Fair                              ADL Overall ADL's : Needs assistance/impaired Eating/Feeding: Sitting;Modified independent   Grooming: Supervision/safety;Standing   Upper Body Bathing: Set up;Sitting   Lower Body Bathing: Supervison/ safety;Sit to/from stand   Upper Body Dressing : Minimal assistance;Sitting   Lower Body Dressing: Supervision/safety;Sit to/from stand   Toilet Transfer: Supervision/safety;Ambulation   Toileting- Clothing Manipulation and Hygiene: Supervision/safety;Sit to/from stand       Functional mobility during ADLs: Supervision/safety;Rolling walker        Vision Additional Comments: worsening of vision since admission, was supposed to have eye appointment tomorrow   Perception     Praxis      Pertinent Vitals/Pain Pain Assessment: No/denies pain     Hand Dominance Right   Extremity/Trunk Assessment Upper Extremity Assessment Upper Extremity Assessment: Overall WFL for tasks assessed   Lower Extremity Assessment Lower Extremity Assessment: Defer to PT evaluation       Communication Communication Communication: No difficulties   Cognition Arousal/Alertness: Awake/alert Behavior During Therapy: WFL for tasks assessed/performed Overall Cognitive Status: Within Functional Limits for tasks assessed                     General Comments       Exercises       Shoulder Instructions      Home Living Family/patient expects to be discharged to:: Private residence Living Arrangements: Alone Available Help at Discharge: Family;Friend(s);Available 24 hours/day Type of Home: Apartment Home Access: Stairs to enter Entrance Stairs-Number of Steps: 3 Entrance Stairs-Rails: Right;Left;Can reach both Home Layout: Two level Alternate Level Stairs-Number of Steps: flight Alternate Level Stairs-Rails: Right Bathroom Shower/Tub: Teacher, early years/pre: Standard     Home Equipment: Cane - single point;Walker - 2 wheels          Prior Functioning/Environment Level of Independence: Independent with assistive device(s)        Comments: uses cane when his back is painful    OT Diagnosis: Generalized weakness;Blindness and low vision  OT Problem List: Decreased activity tolerance;Impaired balance (sitting and/or standing);Decreased knowledge of use of DME or AE;Cardiopulmonary status limiting activity;Impaired vision/perception   OT Treatment/Interventions: Self-care/ADL training;Energy conservation;DME and/or AE instruction;Therapeutic activities;Patient/family education;Balance  training;Visual/perceptual remediation/compensation    OT Goals(Current goals can be found in the care plan section) Acute Rehab OT Goals Patient Stated Goal: return home OT Goal Formulation: With patient Time For Goal Achievement: 01/28/15 Potential to Achieve Goals: Good ADL Goals Pt Will Perform Grooming: with modified independence;standing Pt Will Perform Lower Body Bathing: with modified independence;sit to/from stand Pt Will Perform Lower Body Dressing: with modified independence;sit to/from stand Pt Will Transfer to Toilet: with modified independence;ambulating;regular height toilet Pt Will Perform Toileting - Clothing Manipulation and hygiene: with modified independence;sit to/from stand Pt Will Perform Tub/Shower Transfer: Tub transfer;with modified independence;ambulating;shower seat;rolling walker Additional ADL Goal #1: Pt will generalize energy conservation and breathing techniques in ADL independently.  OT Frequency: Min 2X/week   Barriers to D/C:            Co-evaluation PT/OT/SLP Co-Evaluation/Treatment: Yes Reason for Co-Treatment: For patient/therapist safety   OT goals addressed during session: ADL's and self-care      End of Session Equipment Utilized During Treatment: Gait belt;Rolling walker Nurse Communication: Mobility status (pt in bathroom, ok to leave off 02)  Activity Tolerance: Patient limited by fatigue Patient left: Other (comment) (on toilet with call button)   Time: [id number] OT Time Calculation (min): 26 min Charges:  OT General Charges $OT Visit: 1 Procedure OT Evaluation $Initial OT Evaluation Tier I: 1 Procedure G-Codes:    Jerry Cantrell 01/21/2015, 1:04 PM  [phone]

## 2015-01-21 NOTE — Progress Notes (Signed)
[facility] Progress Note   Jerry Cantrell [id number] DOB: 1965/09/08 DOA: 01/17/2015 PCP: Benito Mccreedy, MD  Brief narrative: [name] is a 49 year old male with past medical history of hypertension, hyperlipidemia, diabetes mellitus, depression, diastolic heart failure, CK D3 and chronic back pain who presented to the hospital with altered mental status cough and dyspnea. He was found to have a community-acquired pneumonia and due to respiratory distress was started on a BiPAP.   Subjective: [name] was evaluated earlier this morning. He has no complaints of cough or shortness of breath. He is been tolerating a regular diet without any nausea vomiting or diarrhea.   Assessment/Plan: CAP with acute respiratory failure -Right upper lobe pneumonia -has been transitioned from BiPAP to nasal cannula - currently requiring about 4 L -cont ceftriaxone and azithromycin -Urine strep negative - Unfortunately, Legionella antigen still pending -Albuterol when necessary -Repeat chest x-ray in 3-4 weeks to ensure there is clearing   Active Problems:   Acute encephalopathy -Secondary to sepsis and hypoxia- had increasing agitation on the night of 5/20 and required Precedex infusion- now quite oriented and calm- does not recall events related to admission   Insulin dependent type 2 diabetes mellitus, uncontrolled  Decreased peripheral vision of left eye  Proliferative diabetic retinopathy -Significantly elevated sugars noted in the ER with glucose being 784-required insulin infusion initially but was not in DKA -Lantus resumed - sugars continue to be elevated today-will increase Lantus again today  - cont high dose sliding scale   Hypertension -Placed on IV Lopressor while on Bipap- transition back to oral meds and follow BP - takes Coreg, hydralazine, metolazone, furosemide and verapamil and Norvasc ( 2 CCB?) at home- diuretics on hold due to ARF - BP has been running  quite high- cont to follow BP, may need to add Clonidine   Acute renal failure/chronic kidney disease stage III -Baseline creatinine 1.3-1.5-creatinine on admission 3.59 BUN of 82- has been improving steadily - Reactive and now down to 1.92 - Likely secondary to diuretics in setting of sepsis - holding diuretics -will discontinue normal saline today   Smoker -Nicotine patch applied - advised to quit   Obstructive sleep apnea -We'll need a sleep study as outpatient-a sleep study was was previously ordered but he was not able to make it to have it done -We'll need CPAP/ BiPAP at bedtime    Depression -cont Elavil   Diastolic heart failure secondary to hypertension -Last echo in 4/15 revealed in EF of 50-55% and grade 1 diastolic dysfunction-diuretics on hold as he is dehydrated and in acute renal failure -Echo repeated -  report mentioned below- no mention of diastolic dysfunction  Hypokalemia -Replacing   Morbid obesity Body mass index is 38.4 kg/(m^2).    Code Status: full code Family Communication:  Disposition Plan: cont to follow in SDU- does not have a PCP and will have to arrange for him to follow-up at the [facility] DVT prophylaxis: Heparin Consultants: Pulmonary /critical care Procedures: 2-D echo EF 50-55%,no regional wall motion abnormalities, left atrium mildly dilated, atrial septum reveals increased thickness consistent with lipomatous hypertrophy- diastolic function was not mentioned  Antibiotics: Anti-infectives    Start     Dose/Rate Route Frequency Ordered Stop   01/19/15 0000  azithromycin (ZITHROMAX) 500 mg in dextrose 5 % 250 mL IVPB     500 mg 250 mL/hr over 60 Minutes Intravenous Every 24 hours 01/18/15 0405     01/19/15 0000  cefTRIAXone (ROCEPHIN) 1 g in dextrose  5 % 50 mL IVPB     1 g 100 mL/hr over 30 Minutes Intravenous Every 24 hours 01/18/15 0405     01/18/15 0400  azithromycin (ZITHROMAX) 500 mg in dextrose 5 % 250 mL IVPB   Status:  Discontinued     500 mg 250 mL/hr over 60 Minutes Intravenous Every 24 hours 01/18/15 0359 01/18/15 0405   01/18/15 0400  cefTRIAXone (ROCEPHIN) 1 g in dextrose 5 % 50 mL IVPB  Status:  Discontinued     1 g 100 mL/hr over 30 Minutes Intravenous Every 24 hours 01/18/15 0359 01/18/15 0405   01/18/15 0115  cefTRIAXone (ROCEPHIN) 1 g in dextrose 5 % 50 mL IVPB     1 g 100 mL/hr over 30 Minutes Intravenous  Once 01/18/15 0107 01/18/15 0245   01/18/15 0115  azithromycin (ZITHROMAX) 500 mg in dextrose 5 % 250 mL IVPB     500 mg 250 mL/hr over 60 Minutes Intravenous  Once 01/18/15 0107 01/18/15 0318      Objective: Filed Weights   01/18/15 1030  Weight: 132 kg (291 lb 0.1 oz)    Intake/Output Summary (Last 24 hours) at 01/21/15 1159 Last data filed at 01/21/15 0900  Gross per 24 hour  Intake   1760 ml  Output   2002 ml  Net   -242 ml     Vitals Filed Vitals:   01/21/15 0800 01/21/15 0900 01/21/15 1000 01/21/15 1145  BP: 196/109 169/76 160/79 152/83  Pulse: 82 81 70 72  Temp:    98.3 F (36.8 C)  TempSrc:    Oral  Resp: 15 21 18 19   Height:      Weight:      SpO2: 94% 93% 93% 96%    Exam:  General:  Pt is alert, not in acute distress  HEENT: No icterus, No thrush  Cardiovascular: regular rate and rhythm, S1/S2 No murmur  Respiratory: Mild basilar crackles bilaterally- on 5 L nasal cannula at this time   Abdomen: Soft, +Bowel sounds, non tender, non distended, no guarding  MSK: No LE edema, cyanosis or clubbing  Data Reviewed: Basic Metabolic Panel:  Recent Labs Lab 01/18/15 1656 01/18/15 2309 01/19/15 0250 01/19/15 1105 01/20/15 0828 01/21/15 0009  NA 131* 141 141  --  139 141  K >7.5* 3.6 3.4*  --  3.5 3.2*  CL 95* 95* 94*  --  96* 96*  CO2 31 33* 36*  --  33* 33*  GLUCOSE 193* 233* 232*  --  404* 305*  BUN 70* 70* 70*  --  54* 49*  CREATININE 2.85* 2.98* 2.96*  --  2.02* 1.92*  CALCIUM 7.9* 8.5* 8.4*  --  8.7* 8.6*  MG  --   --   --  2.2   --   --    Liver Function Tests: No results for input(s): AST, ALT, ALKPHOS, BILITOT, PROT, ALBUMIN in the last 168 hours. No results for input(s): LIPASE, AMYLASE in the last 168 hours. No results for input(s): AMMONIA in the last 168 hours. CBC:  Recent Labs Lab 01/18/15 01/19/15 0250  WBC 8.7 7.7  HGB 15.5 13.4  HCT 48.1 43.9  MCV 96.0 97.6  PLT 219 230   Cardiac Enzymes:  Recent Labs Lab 01/18/15 0512 01/18/15 1100  TROPONINI 0.03 <0.03   BNP (last 3 results)  Recent Labs  01/18/15  BNP 279.0*    ProBNP (last 3 results)  Recent Labs  06/26/14 0920  PROBNP 451.5*  CBG:  Recent Labs Lab 01/20/15 0729 01/20/15 1112 01/20/15 1634 01/20/15 2213 01/21/15 0755  GLUCAP 414* 321* 226* 291* 257*    Recent Results (from the past 240 hour(s))  Blood culture (routine x 2)     Status: None (Preliminary result)   Collection Time: 01/18/15  1:15 AM  Result Value Ref Range Status   Specimen Description BLOOD RIGHT ANTECUBITAL  Final   Special Requests BOTTLES DRAWN AEROBIC AND ANAEROBIC 5CC  Final   Culture   Final           BLOOD CULTURE RECEIVED NO GROWTH TO DATE CULTURE WILL BE HELD FOR 5 DAYS BEFORE ISSUING A FINAL NEGATIVE REPORT Performed at [facility]    Report Status PENDING  Incomplete  Blood culture (routine x 2)     Status: None (Preliminary result)   Collection Time: 01/18/15  1:35 AM  Result Value Ref Range Status   Specimen Description BLOOD RIGHT HAND  Final   Special Requests BOTTLES DRAWN AEROBIC AND ANAEROBIC 4CC  Final   Culture   Final           BLOOD CULTURE RECEIVED NO GROWTH TO DATE CULTURE WILL BE HELD FOR 5 DAYS BEFORE ISSUING A FINAL NEGATIVE REPORT Performed at [facility]    Report Status PENDING  Incomplete  MRSA PCR Screening     Status: None   Collection Time: 01/18/15 10:56 AM  Result Value Ref Range Status   MRSA by PCR NEGATIVE NEGATIVE Final    Comment:        The GeneXpert MRSA Assay  (FDA approved for NASAL specimens only), is one component of a comprehensive MRSA colonization surveillance program. It is not intended to diagnose MRSA infection nor to guide or monitor treatment for MRSA infections.      Studies: No results found.  Scheduled Meds:  Scheduled Meds: . albuterol  2.5 mg Nebulization Q4H  . amitriptyline  50 mg Oral QHS  . amLODipine  10 mg Oral Daily  . antiseptic oral rinse  7 mL Mouth Rinse q12n4p  . aspirin  325 mg Oral Daily  . azithromycin  500 mg Intravenous Q24H  . carvedilol  25 mg Oral BID WC  . cefTRIAXone (ROCEPHIN)  IV  1 g Intravenous Q24H  . heparin  5,000 Units Subcutaneous 3 times per day  . hydrALAZINE  100 mg Oral TID  . insulin aspart  0-20 Units Subcutaneous TID WC  . insulin aspart  0-5 Units Subcutaneous QHS  . insulin glargine  40 Units Subcutaneous BID  . isosorbide mononitrate  30 mg Oral Daily  . nicotine  21 mg Transdermal Daily  . omega-3 acid ethyl esters  2 g Oral BID  . potassium chloride  40 mEq Oral Q4H  . verapamil  120 mg Oral Daily   Continuous Infusions:    Time spent on care of this patient: 35 minutes   [place], MD 01/21/2015, 11:59 AM  LOS: 3 days   [facility] Office  [phone] Pager - Text Page per [email or web address]  If 7PM-7AM, please contact night-coverage [email or web address]

## 2015-01-21 NOTE — Progress Notes (Signed)
01/21/2015 patient transfer from 2 heart to 2 central at 1835. He is alert, oriented and ambulatory with  Assist, because patient is blind in both eyes, but stated he can see little in the right eye. Feet is dry and upper back he bumps patient said it acne. He have yellow socks on and bed alarm was turn on. Patient said he fell last week have abrasion to bilateral lower legs.

## 2015-01-21 NOTE — Progress Notes (Signed)
Spoke w [facility]. They would not give pt appt as he has medicaid and was assigned dr Emilee Hero bonus. Spoke w dr Emilee Hero bonsu office and pt no longer pt of theirs. Spoke w pt and he moved to [place] and lost [employer] for [employer]. Just recently moved back to [place]. He is aware he will need to get medicaid to assign him new pcp. He would like 1 phy and not multi phy like teach clinics. Have left message for [facility] to see if they will see him until he gets new phy assigned. Will await call back.

## 2015-01-22 DIAGNOSIS — A419 Sepsis, unspecified organism: Secondary | ICD-10-CM

## 2015-01-22 DIAGNOSIS — J9602 Acute respiratory failure with hypercapnia: Secondary | ICD-10-CM

## 2015-01-22 DIAGNOSIS — J9600 Acute respiratory failure, unspecified whether with hypoxia or hypercapnia: Secondary | ICD-10-CM

## 2015-01-22 LAB — CBC
HCT: 43.9 % (ref 39.0–52.0)
Hemoglobin: 13.6 g/dL (ref 13.0–17.0)
MCH: 29.7 pg (ref 26.0–34.0)
MCHC: 31 g/dL (ref 30.0–36.0)
MCV: 95.9 fL (ref 78.0–100.0)
PLATELETS: 271 10*3/uL (ref 150–400)
RBC: 4.58 MIL/uL (ref 4.22–5.81)
RDW: 14.5 % (ref 11.5–15.5)
WBC: 6.1 10*3/uL (ref 4.0–10.5)

## 2015-01-22 LAB — BASIC METABOLIC PANEL
Anion gap: 9 (ref 5–15)
BUN: 36 mg/dL — AB (ref 6–20)
CALCIUM: 8.9 mg/dL (ref 8.9–10.3)
CO2: 32 mmol/L (ref 22–32)
CREATININE: 1.71 mg/dL — AB (ref 0.61–1.24)
Chloride: 102 mmol/L (ref 101–111)
GFR calc Af Amer: 52 mL/min — ABNORMAL LOW (ref >60)
GFR calc non Af Amer: 45 mL/min — ABNORMAL LOW (ref >60)
GLUCOSE: 163 mg/dL — AB (ref 65–99)
Potassium: 3.1 mmol/L — ABNORMAL LOW (ref 3.5–5.1)
SODIUM: 143 mmol/L (ref 135–145)

## 2015-01-22 LAB — GLUCOSE, CAPILLARY
Glucose-Capillary: 112 mg/dL — ABNORMAL HIGH (ref 65–99)
Glucose-Capillary: 121 mg/dL — ABNORMAL HIGH (ref 65–99)

## 2015-01-22 LAB — MAGNESIUM: Magnesium: 2.1 mg/dL (ref 1.7–2.4)

## 2015-01-22 MED ORDER — INSULIN GLARGINE 100 UNIT/ML ~~LOC~~ SOLN: 40.0000 [IU] | Freq: Two times a day (BID) | SUBCUTANEOUS | Status: DC

## 2015-01-22 MED ORDER — NICOTINE 7 MG/24HR TD PT24: 7.0000 mg | MEDICATED_PATCH | Freq: Every day | TRANSDERMAL | Status: DC

## 2015-01-22 MED ORDER — FUROSEMIDE 80 MG PO TABS: 80.0000 mg | ORAL_TABLET | Freq: Every day | ORAL | Status: DC | PRN

## 2015-01-22 MED ORDER — NICOTINE 14 MG/24HR TD PT24: 14.0000 mg | MEDICATED_PATCH | Freq: Every day | TRANSDERMAL | Status: DC

## 2015-01-22 MED ORDER — ALBUTEROL SULFATE HFA 108 (90 BASE) MCG/ACT IN AERS: 2.0000 | INHALATION_SPRAY | RESPIRATORY_TRACT | Status: DC | PRN

## 2015-01-22 MED ORDER — LEVOFLOXACIN 750 MG PO TABS: 750.0000 mg | ORAL_TABLET | Freq: Every day | ORAL | Status: DC

## 2015-01-22 MED ORDER — NICOTINE 7 MG/24HR TD PT24
7.0000 mg | MEDICATED_PATCH | Freq: Every day | TRANSDERMAL | Status: DC
Start: 2015-02-17 — End: 2015-05-13

## 2015-01-22 MED ORDER — NICOTINE 21 MG/24HR TD PT24: 21.0000 mg | MEDICATED_PATCH | Freq: Every day | TRANSDERMAL | Status: DC

## 2015-01-22 MED ORDER — CLONIDINE HCL 0.2 MG PO TABS: 0.2000 mg | ORAL_TABLET | Freq: Three times a day (TID) | ORAL | Status: DC

## 2015-01-22 MED ORDER — POTASSIUM CHLORIDE CRYS ER 20 MEQ PO TBCR: 40.0000 meq | EXTENDED_RELEASE_TABLET | ORAL | Status: DC | PRN

## 2015-01-22 MED ORDER — HYDRALAZINE HCL 100 MG PO TABS: 100.0000 mg | ORAL_TABLET | Freq: Three times a day (TID) | ORAL | Status: DC

## 2015-01-22 NOTE — Clinical Documentation Improvement (Signed)
Presents with Pneumonia, Acute on Chronic Respiratory Failure (placed on BIPAP), Encephalopathy, ARF. Conflicting documentation in regards to Sepsis.   Patient at risk for Sepsis and Hypotension   versus  Acute Encephalopathy, ARF secondary to Sepsis   WBC ranging from 6.1 to 8.7  Lactate = 1.2; repeat draw = 0.8  HR < 90, RR ranging from 15 to 25, Afebrile  Please clarify the clinical indicators you are using to support the diagnosis of sepsis or specify if sepsis has been ruled out. Please document findings in next progress note and include in discharge summary.  Other Condition  Cannot clinically Determine   Thank You, Zoila Shutter ,RN Clinical Documentation Specialist:  [facility]

## 2015-01-22 NOTE — Care Management Note (Signed)
Case Management Note  Patient Details  Name: Jerry Cantrell MRN: [id number] Date of Birth: 01/15/1966  Subjective/Objective:   Dx CAP                 Action/Plan: PT/OT recommend home therapy and shower seat.  Pt states he does not need services or equipment - plans to discharge to friend's home and will remain there for at least a week.  Has just gotten a new apartment and will transfer to his apartment when he no longer needs the support of his friend.  Will discharge home on nocturnal BiPAP, information faxed to [facility], sleep study is scheduled for Thursday, 5/26.  Expected Discharge Date:                  Expected Discharge Plan:  Home/Self Care  In-House Referral:     Discharge planning Services  CM Consult  Post Acute Care Choice:  Durable Medical Equipment Choice offered to:     DME Arranged:  Bipap DME Agency:  [facility] Arranged:  Patient Refused [facility] Agency:     Status of Service:     Medicare Important Message Given:  No Date Medicare IM Given:    Medicare IM give by:    Date Additional Medicare IM Given:    Additional Medicare Important Message give by:     If discussed at [place] of Stay Meetings, dates discussed:    Additional Comments:  Girard Cooter, RN 01/22/2015, 11:24 AM

## 2015-01-22 NOTE — Progress Notes (Signed)
Noted order to DC home, paper work provided for DME. Instruction given and patient verbalized understanding.

## 2015-01-22 NOTE — Plan of Care (Signed)
Problem: Discharge Progression Outcomes Goal: O2 sats at patient's baseline Outcome: Adequate for Discharge Pt to go home on Bipap machine awaiting sleep study

## 2015-01-22 NOTE — Discharge Summary (Signed)
Physician Discharge Summary  Jerry Cantrell [id number] DOB: 02-Feb-1966 DOA: 01/17/2015  PCP: Jerry Mccreedy, MD  Admit date: 01/17/2015 Discharge date: 01/22/2015  Time spent: 50 minutes  Recommendations for Outpatient Follow-up:  1. Repeat chest x-ray in 3-4 weeks to ensure clearing 2. Needs an outpatient referral to nephrology 3. Continue to follow blood pressure and blood sugars and adjust medications as needed - he has had poor control of both at home 4. Follow up on sleep study that has been ordered for later this week  Discharge Condition: Stable Diet recommendation: Sodium heart healthy diabetic  Discharge Diagnoses:  Principal Problem:   Acute respiratory failure/  CAP (community acquired pneumonia)  Active Problems:   Acute encephalopathy   Sepsis   Obstructive sleep apnea   Acute renal failure superimposed on stage 3 chronic kidney disease   Lumbar spondylosis   Lumbar spinal stenosis   Insulin dependent type 2 diabetes mellitus, uncontrolled   Hypertension   Chronic low back pain   Smoker   Decreased peripheral vision of left eye   Proliferative diabetic retinopathy   Depression   Diastolic heart failure secondary to hypertension    History of present illness:  Jerry Cantrell is a 49 year old male with past medical history of hypertension, hyperlipidemia, diabetes mellitus, depression, diastolic heart failure, CK D3 and chronic back pain who presented to the hospital with altered mental status cough and dyspnea. He was found to have a community-acquired pneumonia and due to respiratory distress was started on a BiPAP.  Hospital Course:   CAP with acute respiratory failure/sepsis -Right upper lobe pneumonia -Acute respiratory failure with hypoxia and significant hypercarbia -Required BiPAP for initial 24 hours due to significant respiratory distress -Has been weaned off of O2 as of last night-Oxygen on room air is now greater than 95% -Was treated with  ceftriaxone and azithromycin- transitioning to Levaquin to complete a 10 day course of Anna biotics -strep and Legionella antigens negative - -Repeat chest x-ray in 3-4 weeks to ensure there is clearing    Acute encephalopathy -Secondary to sepsis and hypoxia- had increasing agitation on the night of 5/20 and required Precedex infusion- subsequently became well-oriented and continues to be quite oriented and calm- does not recall events related to admission   Insulin dependent type 2 diabetes mellitus, uncontrolled  Decreased peripheral vision of left eye  Proliferative diabetic retinopathy -Significantly elevated sugars noted in the ER with glucose being 784-required insulin infusion initially but was not in DKA -Lantus and NovoLog resumed - Lantus dose increased a sugars continue to be quite elevated - CBG this morning was significantly improved at 112-he can continue NovoLog with meals   Hypertension - takes Coreg, hydralazine, metolazone, furosemide and verapamil and Norvasc ( 2 CCB?) at home - diuretics have been on hold due to ARF-  -I have discontinued Norvasc and have started clonidine instead -We'll continue to hold diuretics (severe renal failure on admission) unless he is developing fluid retention-he has been explained this   Acute renal failure/chronic kidney disease stage III -Baseline creatinine 1.3-1.5-creatinine on admission 3.59 BUN of 82- has been improving steadily - creatinine down to 1.71 today -  suspect acute renal failure was prerenalLikely secondary to diuretics in setting of sepsis and he required significant amount of hydration initially in the hospital stay  -recommending diuretics on an as needed basis only from here on as mentioned above    Smoker -Nicotine patch applied and prescribed as well - advised to quit   Obstructive sleep apnea -  Severely hypoxic when asleep with pulse ox dropping to the low 80s  -We'll need a sleep study as outpatient-  we have been able to arrange one for later this week  -We'll need CPAP bedtime which we have been able to prescribe for him as well    Depression -cont Elavil   Diastolic heart failure secondary to hypertension -Last echo in 4/15 revealed in EF of 50-55% and grade 1 diastolic dysfunction-diuretics were on hold as he is dehydrated and in acute renal failure- use on an as-needed basis for now-he is advised to weigh himself daily -Echo repeated - report mentioned below- no mention of diastolic dysfunction  Hypokalemia -Replacing   Morbid obesity Body mass index is 38.4 kg/(m^2).  Procedures: 2-D echo EF 50-55%,no regional wall motion abnormalities, left atrium mildly dilated, atrial septum reveals increased thickness consistent with lipomatous hypertrophy- diastolic function was not   Consultations:  Pulmonary critical care  Discharge Exam: Filed Weights   01/18/15 1030 01/21/15 1844 01/22/15 0454  Weight: 132 kg (291 lb 0.1 oz) 134.4 kg (296 lb 4.8 oz) 133.5 kg (294 lb 5 oz)   Filed Vitals:   01/22/15 0800  BP: 157/99  Pulse: 89  Temp:   Resp: 25    General: AAO x 3, no distress Cardiovascular: RRR, no murmurs  Respiratory: clear to auscultation bilaterally GI: soft, non-tender, non-distended, bowel sound positive  Discharge Instructions You were cared for by a hospitalist during your hospital stay. If you have any questions about your discharge medications or the care you received while you were in the hospital after you are discharged, you can call the unit and asked to speak with the hospitalist on call if the hospitalist that took care of you is not available. Once you are discharged, your primary care physician will handle any further medical issues. Please note that NO REFILLS for any discharge medications will be authorized once you are discharged, as it is imperative that you return to your primary care physician (or establish a relationship with a primary care  physician if you do not have one) for your aftercare needs so that they can reassess your need for medications and monitor your lab values.      Discharge Instructions    (HEART FAILURE PATIENTS) Call MD:  Anytime you have any of the following symptoms: 1) 3 pound weight gain in 24 hours or 5 pounds in 1 week 2) shortness of breath, with or without a dry hacking cough 3) swelling in the hands, feet or stomach 4) if you have to sleep on extra pillows at night in order to breathe.    Complete by:  As directed      Discharge instructions    Complete by:  As directed   Low sodium, heart healthy, diabetic diet     Increase activity slowly    Complete by:  As directed             Medication List    STOP taking these medications        amLODipine 10 MG tablet  Commonly known as:  NORVASC     metolazone 5 MG tablet  Commonly known as:  ZAROXOLYN      TAKE these medications        albuterol 108 (90 BASE) MCG/ACT inhaler  Commonly known as:  PROVENTIL HFA;VENTOLIN HFA  Inhale 2 puffs into the lungs every 4 (four) hours as needed for wheezing or shortness of breath.     amitriptyline  50 MG tablet  Commonly known as:  ELAVIL  Take 50 mg by mouth at bedtime.     aspirin 325 MG tablet  Take 325 mg by mouth daily.     bag balm Oint ointment  Apply 1 application topically as needed for dry skin.     carvedilol 25 MG tablet  Commonly known as:  COREG  Take 1 tablet (25 mg total) by mouth 2 (two) times daily with a meal.     cloNIDine 0.2 MG tablet  Commonly known as:  CATAPRES  Take 1 tablet (0.2 mg total) by mouth 3 (three) times daily.     fluticasone 50 MCG/ACT nasal spray  Commonly known as:  FLONASE  Place 1-2 sprays into both nostrils 2 (two) times daily.     furosemide 80 MG tablet  Commonly known as:  LASIX  Take 1 tablet (80 mg total) by mouth daily as needed for edema.     gabapentin 300 MG capsule  Commonly known as:  NEURONTIN  Take 300 mg by mouth 3 (three)  times daily. 200 mg in the morning and 100 mg in the evening.     gemfibrozil 600 MG tablet  Commonly known as:  LOPID  Take 600 mg by mouth 2 (two) times daily before a meal.     hydrALAZINE 100 MG tablet  Commonly known as:  APRESOLINE  Take 1 tablet (100 mg total) by mouth 3 (three) times daily.     insulin aspart 100 UNIT/ML injection  Commonly known as:  novoLOG  Inject 10-30 Units into the skin 3 (three) times daily before meals. Per sliding scale     insulin glargine 100 UNIT/ML injection  Commonly known as:  LANTUS  Inject 0.4 mLs (40 Units total) into the skin 2 (two) times daily.     levofloxacin 750 MG tablet  Commonly known as:  LEVAQUIN  Take 1 tablet (750 mg total) by mouth daily.     LOVAZA 1 G capsule  Generic drug:  omega-3 acid ethyl esters  Take 2 g by mouth 2 (two) times daily.     nicotine 21 mg/24hr patch  Commonly known as:  NICODERM CQ - dosed in mg/24 hours  Place 1 patch (21 mg total) onto the skin daily.     nicotine 14 mg/24hr patch  Commonly known as:  NICODERM CQ  Place 1 patch (14 mg total) onto the skin daily.  Start taking on:  02/04/2015     nicotine 7 mg/24hr patch  Commonly known as:  NICODERM CQ  Place 1 patch (7 mg total) onto the skin daily. Start when 14 mg patches have finished  Start taking on:  02/17/2015     REFRESH OP  Place 2 drops into both eyes 3 (three) times daily as needed (dry eyes).     verapamil 120 MG CR tablet  Commonly known as:  CALAN-SR  Take 1 tablet (120 mg total) by mouth at bedtime.       No Known Allergies    The results of significant diagnostics from this hospitalization (including imaging, microbiology, ancillary and laboratory) are listed below for reference.    Significant Diagnostic Studies: Ct Head Wo Contrast  01/18/2015   CLINICAL DATA:  Altered mental status, hyperglycemia. History of diabetes, substance abuse, hypertension, hyperlipidemia.  EXAM: CT HEAD WITHOUT CONTRAST  TECHNIQUE:  Contiguous axial images were obtained from the base of the skull through the vertex without intravenous contrast.  COMPARISON:  None.  FINDINGS: The ventricles and sulci are mildly prominent for age. No intraparenchymal hemorrhage, mass effect nor midline shift. No acute large vascular territory infarcts.  No abnormal extra-axial fluid collections. Basal cisterns are patent.  No skull fracture. The included ocular globes and orbital contents are non-suspicious. LEFT ocular lens implant. RIGHT maxillary sinus mucosal thickening without paranasal sinus air-fluid levels. Small LEFT mastoid effusion.  IMPRESSION: No acute intracranial process.  Mild parenchymal brain volume loss for age.   Electronically Signed   By: Elon Alas   On: 01/18/2015 01:29   US Renal  01/18/2015   CLINICAL DATA:  Acute kidney injury.  EXAM: RENAL / URINARY TRACT ULTRASOUND COMPLETE  COMPARISON:  None.  FINDINGS: Right Kidney:  Length: 12.3 cm. Mild diffuse increased renal echogenicity. No mass or hydronephrosis visualized.  Left Kidney:  Length: 14.4 cm. Mild diffuse increased renal echogenicity. No hydronephrosis, there is borderline mild prominence of the interpolar calices. No cystic or solid lesion.  Bladder:  Distended with bladder volume of 622 cc. No bladder wall thickening or focal abnormality.  Other: Suspect trace perihepatic ascites.  IMPRESSION: 1. Echogenic kidneys consistent with chronic medical renal disease. Minimal prominence of the interpolar calices on the left without frank hydronephrosis. 2. Distended urinary bladder. 3. Question trace perihepatic ascites.   Electronically Signed   By: Jeb Levering M.D.   On: 01/18/2015 05:46   Dg Chest Port 1 View  01/19/2015   CLINICAL DATA:  Followup pneumonia.  EXAM: PORTABLE CHEST - 1 VIEW  COMPARISON:  Yesterday.  FINDINGS: Significant right upper lobe airspace opacity with mild improvement. Interval linear density in the left lower lung zone. Stable enlarged  cardiac silhouette. Linear densities an lucencies along the left upper heart border, suggesting the possibility of a pneumomediastinum on 1 of the views. This is not seen on the other view, and therefore is most likely artifactual. Lower thoracic spine degenerative changes.  IMPRESSION: 1. Mildly improved right upper lobe pneumonia. 2. Interval mild left lower lung zone linear atelectasis. 3. Probable artifact overlying the left upper heart border on one of the views.   Electronically Signed   By: Claudie Revering M.D.   On: 01/19/2015 10:35   Dg Chest Port 1 View  01/18/2015   CLINICAL DATA:  Acute onset of worsening shortness of breath, lethargy and hyperglycemia. Initial encounter.  EXAM: PORTABLE CHEST - 1 VIEW  COMPARISON:  Chest radiograph performed 06/26/2014  FINDINGS: The lungs are well-aerated. Right upper lobe airspace opacification is compatible with pneumonia. Underlying vascular congestion is noted. No pleural effusion or pneumothorax is seen.  The cardiomediastinal silhouette is enlarged. No acute osseous abnormalities are seen.  IMPRESSION: 1. Right upper lobe pneumonia noted. Followup PA and lateral chest X-ray is recommended in 3-4 weeks following trial of antibiotic therapy to ensure resolution and exclude underlying malignancy. 2. Underlying vascular congestion and cardiomegaly.   Electronically Signed   By: Garald Balding M.D.   On: 01/18/2015 00:59    Microbiology: Recent Results (from the past 240 hour(s))  Blood culture (routine x 2)     Status: None (Preliminary result)   Collection Time: 01/18/15  1:15 AM  Result Value Ref Range Status   Specimen Description BLOOD RIGHT ANTECUBITAL  Final   Special Requests BOTTLES DRAWN AEROBIC AND ANAEROBIC 5CC  Final   Culture   Final           BLOOD CULTURE RECEIVED NO GROWTH TO DATE CULTURE WILL BE HELD FOR 5 DAYS BEFORE  ISSUING A FINAL NEGATIVE REPORT Performed at [facility]    Report Status PENDING  Incomplete  Blood culture  (routine x 2)     Status: None (Preliminary result)   Collection Time: 01/18/15  1:35 AM  Result Value Ref Range Status   Specimen Description BLOOD RIGHT HAND  Final   Special Requests BOTTLES DRAWN AEROBIC AND ANAEROBIC 4CC  Final   Culture   Final           BLOOD CULTURE RECEIVED NO GROWTH TO DATE CULTURE WILL BE HELD FOR 5 DAYS BEFORE ISSUING A FINAL NEGATIVE REPORT Performed at [facility]    Report Status PENDING  Incomplete  MRSA PCR Screening     Status: None   Collection Time: 01/18/15 10:56 AM  Result Value Ref Range Status   MRSA by PCR NEGATIVE NEGATIVE Final    Comment:        The GeneXpert MRSA Assay (FDA approved for NASAL specimens only), is one component of a comprehensive MRSA colonization surveillance program. It is not intended to diagnose MRSA infection nor to guide or monitor treatment for MRSA infections.      Labs: Basic Metabolic Panel:  Recent Labs Lab 01/18/15 2309 01/19/15 0250 01/19/15 1105 01/20/15 0828 01/21/15 0009 01/22/15 0320  NA 141 141  --  139 141 143  K 3.6 3.4*  --  3.5 3.2* 3.1*  CL 95* 94*  --  96* 96* 102  CO2 33* 36*  --  33* 33* 32  GLUCOSE 233* 232*  --  404* 305* 163*  BUN 70* 70*  --  54* 49* 36*  CREATININE 2.98* 2.96*  --  2.02* 1.92* 1.71*  CALCIUM 8.5* 8.4*  --  8.7* 8.6* 8.9  MG  --   --  2.2  --   --  2.1   Liver Function Tests: No results for input(s): AST, ALT, ALKPHOS, BILITOT, PROT, ALBUMIN in the last 168 hours. No results for input(s): LIPASE, AMYLASE in the last 168 hours. No results for input(s): AMMONIA in the last 168 hours. CBC:  Recent Labs Lab 01/18/15 01/19/15 0250 01/22/15 0320  WBC 8.7 7.7 6.1  HGB 15.5 13.4 13.6  HCT 48.1 43.9 43.9  MCV 96.0 97.6 95.9  PLT 219 230 271   Cardiac Enzymes:  Recent Labs Lab 01/18/15 0512 01/18/15 1100  TROPONINI 0.03 <0.03   BNP: BNP (last 3 results)  Recent Labs  01/18/15  BNP 279.0*    ProBNP (last 3 results)  Recent Labs   06/26/14 0920  PROBNP 451.5*    CBG:  Recent Labs Lab 01/21/15 0755 01/21/15 1151 01/21/15 1702 01/21/15 2027 01/22/15 0753  GLUCAP 257* 280* 278* 226* 112*       SignedDebbe Odea, MD [facility] 01/22/2015, 12:16 PM

## 2015-01-24 ENCOUNTER — Ambulatory Visit (HOSPITAL_BASED_OUTPATIENT_CLINIC_OR_DEPARTMENT_OTHER): Payer: Medicaid Other | Attending: Pulmonary Disease

## 2015-01-24 VITALS — Ht 73.0 in | Wt 289.0 lb

## 2015-01-24 DIAGNOSIS — G4733 Obstructive sleep apnea (adult) (pediatric): Secondary | ICD-10-CM | POA: Insufficient documentation

## 2015-01-24 DIAGNOSIS — I493 Ventricular premature depolarization: Secondary | ICD-10-CM | POA: Insufficient documentation

## 2015-01-24 DIAGNOSIS — R0683 Snoring: Secondary | ICD-10-CM | POA: Insufficient documentation

## 2015-01-24 DIAGNOSIS — G4710 Hypersomnia, unspecified: Secondary | ICD-10-CM | POA: Diagnosis present

## 2015-01-24 DIAGNOSIS — G4730 Sleep apnea, unspecified: Secondary | ICD-10-CM

## 2015-01-24 LAB — CULTURE, BLOOD (ROUTINE X 2)
CULTURE: NO GROWTH
Culture: NO GROWTH

## 2015-01-31 ENCOUNTER — Ambulatory Visit (HOSPITAL_BASED_OUTPATIENT_CLINIC_OR_DEPARTMENT_OTHER): Payer: Medicaid Other | Admitting: Pulmonary Disease

## 2015-01-31 DIAGNOSIS — G4733 Obstructive sleep apnea (adult) (pediatric): Secondary | ICD-10-CM | POA: Diagnosis not present

## 2015-02-01 ENCOUNTER — Telehealth: Payer: Self-pay | Admitting: Pulmonary Disease

## 2015-02-01 NOTE — Telephone Encounter (Signed)
Jerry Cantrell, please see the results of the sleep study you ordered on this pt.  Thanks.

## 2015-02-01 NOTE — Sleep Study (Signed)
   NAME: Jerry Cantrell DATE OF BIRTH:  Aug 26, 1966 MEDICAL RECORD NUMBER [id number]  [facility]  PHYSICIAN: Kathee Delton  DATE OF STUDY: 01/24/2015  SLEEP STUDY TYPE: Nocturnal Polysomnogram               REFERRING PHYSICIAN: Wilhelmina Mcardle, MD  INDICATION FOR STUDY: Hypersomnia with sleep apnea  EPWORTH SLEEPINESS SCORE:  6 HEIGHT: 6\' 1"  (185.4 cm)  WEIGHT: 289 lb (131.09 kg)    Body mass index is 38.14 kg/(m^2).  NECK SIZE: 18 in.  MEDICATIONS: Reviewed in the sleep record  SLEEP ARCHITECTURE: The patient had a total sleep time of 294 minutes with no slow-wave sleep and decreased quantity of REM. Sleep onset latency was normal at 25 minutes, and the patient did not achieve REM until the titration portion of the study. Sleep efficiency was 67% during the diagnostic portion of the study, and 98% during the titration portion.  RESPIRATORY DATA: The patient underwent a split night study where he was found to have 201 obstructive events in the first 122 minutes of sleep. This gave him an AHI of 99 events per hour during the diagnostic portion of the study. The events were not positional, and there was very loud snoring throughout. By protocol, he was fitted with a large Fischer Paykel Simplus full face mask, and his titration was initiated. The patient reached maximal C Pap pressure, and was changed over to bilevel. He was titrated to the highest bilevel pressure of available at 25/21, and continued to have a very elevated AHI.  OXYGEN DATA: There was oxygen desaturation as low as 64% with the patient's obstructive events  CARDIAC DATA: Occasional PVC noted  MOVEMENT/PARASOMNIA: No clinically significant limb movements or abnormal behaviors were noted.  IMPRESSION/ RECOMMENDATION:    1) split-night study reveals very severe obstructive sleep apnea, with an AHI of 99 events per hour and oxygen desaturation as low as 64% during the diagnostic portion of  the study. He was then fitted with a large Fischer Paykel Simplus full face mask, and ultimately titrated to a bilevel pressure of 25/21. This is the maximal pressure available on the device, and he continued to have very significant breakthrough events. The patient will obviously need aggressive treatment of his sleep apnea, along with weight loss. Given the fact that his sleep apnea was not controlled with a bilevel device at maximum pressure, consideration could be given to a tracheostomy.  2) occasional PVC noted, but no clinically significant arrhythmias were seen    Kathee Delton Diplomate, American Board of Sleep Medicine  ELECTRONICALLY SIGNED ON:  02/01/2015, 8:49 AM [place] PH: [phone]   FX: [phone] [place]

## 2015-02-05 ENCOUNTER — Emergency Department (HOSPITAL_COMMUNITY): Payer: Medicaid Other

## 2015-02-05 ENCOUNTER — Encounter (HOSPITAL_COMMUNITY): Payer: Self-pay

## 2015-02-05 ENCOUNTER — Emergency Department (HOSPITAL_COMMUNITY)
Admission: EM | Admit: 2015-02-05 | Discharge: 2015-02-05 | Disposition: A | Discharge status: Home / Self Care | Payer: Medicaid Other | Attending: Emergency Medicine | Admitting: Emergency Medicine

## 2015-02-05 DIAGNOSIS — R059 Cough, unspecified: Secondary | ICD-10-CM

## 2015-02-05 DIAGNOSIS — E1165 Type 2 diabetes mellitus with hyperglycemia: Secondary | ICD-10-CM | POA: Insufficient documentation

## 2015-02-05 DIAGNOSIS — N189 Chronic kidney disease, unspecified: Secondary | ICD-10-CM | POA: Insufficient documentation

## 2015-02-05 DIAGNOSIS — I5031 Acute diastolic (congestive) heart failure: Secondary | ICD-10-CM | POA: Diagnosis not present

## 2015-02-05 DIAGNOSIS — Z7951 Long term (current) use of inhaled steroids: Secondary | ICD-10-CM | POA: Insufficient documentation

## 2015-02-05 DIAGNOSIS — Z8669 Personal history of other diseases of the nervous system and sense organs: Secondary | ICD-10-CM | POA: Diagnosis not present

## 2015-02-05 DIAGNOSIS — Z87440 Personal history of urinary (tract) infections: Secondary | ICD-10-CM | POA: Insufficient documentation

## 2015-02-05 DIAGNOSIS — Z794 Long term (current) use of insulin: Secondary | ICD-10-CM | POA: Diagnosis not present

## 2015-02-05 DIAGNOSIS — M47816 Spondylosis without myelopathy or radiculopathy, lumbar region: Secondary | ICD-10-CM | POA: Diagnosis not present

## 2015-02-05 DIAGNOSIS — Z760 Encounter for issue of repeat prescription: Secondary | ICD-10-CM

## 2015-02-05 DIAGNOSIS — Z79899 Other long term (current) drug therapy: Secondary | ICD-10-CM | POA: Diagnosis not present

## 2015-02-05 DIAGNOSIS — R079 Chest pain, unspecified: Secondary | ICD-10-CM | POA: Diagnosis not present

## 2015-02-05 DIAGNOSIS — Z7982 Long term (current) use of aspirin: Secondary | ICD-10-CM | POA: Diagnosis not present

## 2015-02-05 DIAGNOSIS — Z720 Tobacco use: Secondary | ICD-10-CM | POA: Insufficient documentation

## 2015-02-05 DIAGNOSIS — I129 Hypertensive chronic kidney disease with stage 1 through stage 4 chronic kidney disease, or unspecified chronic kidney disease: Secondary | ICD-10-CM | POA: Insufficient documentation

## 2015-02-05 DIAGNOSIS — E669 Obesity, unspecified: Secondary | ICD-10-CM | POA: Insufficient documentation

## 2015-02-05 DIAGNOSIS — Z8719 Personal history of other diseases of the digestive system: Secondary | ICD-10-CM | POA: Insufficient documentation

## 2015-02-05 DIAGNOSIS — R109 Unspecified abdominal pain: Secondary | ICD-10-CM | POA: Diagnosis present

## 2015-02-05 DIAGNOSIS — E785 Hyperlipidemia, unspecified: Secondary | ICD-10-CM | POA: Diagnosis not present

## 2015-02-05 DIAGNOSIS — E1140 Type 2 diabetes mellitus with diabetic neuropathy, unspecified: Secondary | ICD-10-CM | POA: Insufficient documentation

## 2015-02-05 DIAGNOSIS — Z8701 Personal history of pneumonia (recurrent): Secondary | ICD-10-CM | POA: Diagnosis not present

## 2015-02-05 DIAGNOSIS — R739 Hyperglycemia, unspecified: Secondary | ICD-10-CM

## 2015-02-05 DIAGNOSIS — E1121 Type 2 diabetes mellitus with diabetic nephropathy: Secondary | ICD-10-CM | POA: Insufficient documentation

## 2015-02-05 DIAGNOSIS — I509 Heart failure, unspecified: Secondary | ICD-10-CM | POA: Diagnosis not present

## 2015-02-05 DIAGNOSIS — Z792 Long term (current) use of antibiotics: Secondary | ICD-10-CM | POA: Insufficient documentation

## 2015-02-05 DIAGNOSIS — R05 Cough: Secondary | ICD-10-CM

## 2015-02-05 LAB — URINE MICROSCOPIC-ADD ON

## 2015-02-05 LAB — URINALYSIS, ROUTINE W REFLEX MICROSCOPIC
Bilirubin Urine: NEGATIVE
KETONES UR: NEGATIVE mg/dL
Leukocytes, UA: NEGATIVE
Nitrite: NEGATIVE
Protein, ur: >300 mg/dL — AB
Specific Gravity, Urine: 1.014 (ref 1.005–1.030)
Urobilinogen, UA: 0.2 mg/dL (ref 0.0–1.0)
pH: 5 (ref 5.0–8.0)

## 2015-02-05 LAB — BASIC METABOLIC PANEL
Anion gap: 12 (ref 5–15)
BUN: 28 mg/dL — ABNORMAL HIGH (ref 6–20)
CALCIUM: 9.8 mg/dL (ref 8.9–10.3)
CO2: 33 mmol/L — ABNORMAL HIGH (ref 22–32)
CREATININE: 2.34 mg/dL — AB (ref 0.61–1.24)
Chloride: 89 mmol/L — ABNORMAL LOW (ref 101–111)
GFR calc Af Amer: 36 mL/min — ABNORMAL LOW (ref >60)
GFR calc non Af Amer: 31 mL/min — ABNORMAL LOW (ref >60)
GLUCOSE: 404 mg/dL — AB (ref 65–99)
Potassium: 3.9 mmol/L (ref 3.5–5.1)
SODIUM: 134 mmol/L — AB (ref 135–145)

## 2015-02-05 LAB — CBC
HCT: 50.1 % (ref 39.0–52.0)
HEMOGLOBIN: 16 g/dL (ref 13.0–17.0)
MCH: 30.2 pg (ref 26.0–34.0)
MCHC: 31.9 g/dL (ref 30.0–36.0)
MCV: 94.5 fL (ref 78.0–100.0)
Platelets: 211 10*3/uL (ref 150–400)
RBC: 5.3 MIL/uL (ref 4.22–5.81)
RDW: 14.5 % (ref 11.5–15.5)
WBC: 5.8 10*3/uL (ref 4.0–10.5)

## 2015-02-05 LAB — I-STAT TROPONIN, ED: Troponin i, poc: 0.02 ng/mL (ref 0.00–0.08)

## 2015-02-05 LAB — CBG MONITORING, ED: Glucose-Capillary: 405 mg/dL — ABNORMAL HIGH (ref 65–99)

## 2015-02-05 MED ORDER — GABAPENTIN 300 MG PO CAPS: 300.0000 mg | ORAL_CAPSULE | Freq: Three times a day (TID) | ORAL | Status: DC

## 2015-02-05 MED ORDER — GLUCOSE BLOOD VI STRP: ORAL_STRIP | Status: DC

## 2015-02-05 MED ORDER — GEMFIBROZIL 600 MG PO TABS: 600.0000 mg | ORAL_TABLET | Freq: Two times a day (BID) | ORAL | Status: DC

## 2015-02-05 MED ORDER — INSULIN GLARGINE 100 UNIT/ML ~~LOC~~ SOLN: 40.0000 [IU] | Freq: Two times a day (BID) | SUBCUTANEOUS | Status: DC

## 2015-02-05 MED ORDER — ALBUTEROL SULFATE HFA 108 (90 BASE) MCG/ACT IN AERS: 2.0000 | INHALATION_SPRAY | RESPIRATORY_TRACT | Status: DC | PRN

## 2015-02-05 MED ORDER — SODIUM CHLORIDE 0.9 % IV BOLUS (SEPSIS): 1000.0000 mL | Freq: Once | INTRAVENOUS | Status: DC

## 2015-02-05 MED ORDER — MORPHINE SULFATE 4 MG/ML IJ SOLN: 4.0000 mg | Freq: Once | INTRAMUSCULAR | Status: DC

## 2015-02-05 MED ORDER — LEVOFLOXACIN 750 MG PO TABS: 750.0000 mg | ORAL_TABLET | Freq: Every day | ORAL | Status: DC

## 2015-02-05 MED ORDER — FUROSEMIDE 80 MG PO TABS: 80.0000 mg | ORAL_TABLET | Freq: Every day | ORAL | Status: DC | PRN

## 2015-02-05 MED ORDER — MORPHINE SULFATE 4 MG/ML IJ SOLN
4.0000 mg | Freq: Once | INTRAMUSCULAR | Status: AC
  Administered 2015-02-05: 4 mg via INTRAVENOUS
  Filled 2015-02-05: qty 1

## 2015-02-05 MED ORDER — FLUTICASONE PROPIONATE 50 MCG/ACT NA SUSP
1.0000 | Freq: Two times a day (BID) | NASAL | Status: DC
Start: 2015-02-05 — End: 2015-10-13

## 2015-02-05 MED ORDER — INSULIN ASPART 100 UNIT/ML ~~LOC~~ SOLN: 10.0000 [IU] | Freq: Three times a day (TID) | SUBCUTANEOUS | Status: DC

## 2015-02-05 MED ORDER — SODIUM CHLORIDE 0.9 % IV BOLUS (SEPSIS)
1000.0000 mL | Freq: Once | INTRAVENOUS | Status: AC
  Administered 2015-02-05: 1000 mL via INTRAVENOUS

## 2015-02-05 MED ORDER — HYDRALAZINE HCL 100 MG PO TABS: 100.0000 mg | ORAL_TABLET | Freq: Three times a day (TID) | ORAL | Status: DC

## 2015-02-05 MED ORDER — CARVEDILOL 25 MG PO TABS: 25.0000 mg | ORAL_TABLET | Freq: Two times a day (BID) | ORAL | Status: DC

## 2015-02-05 MED ORDER — CLONIDINE HCL 0.2 MG PO TABS: 0.2000 mg | ORAL_TABLET | Freq: Three times a day (TID) | ORAL | Status: DC

## 2015-02-05 MED ORDER — VERAPAMIL HCL ER 120 MG PO TBCR: 120.0000 mg | EXTENDED_RELEASE_TABLET | Freq: Every day | ORAL | Status: DC

## 2015-02-05 MED ORDER — AMITRIPTYLINE HCL 50 MG PO TABS: 50.0000 mg | ORAL_TABLET | Freq: Every day | ORAL | Status: DC

## 2015-02-05 MED ORDER — ASPIRIN 325 MG PO TABS: 325.0000 mg | ORAL_TABLET | Freq: Every day | ORAL | Status: DC

## 2015-02-05 MED ORDER — ONDANSETRON HCL 4 MG/2ML IJ SOLN
4.0000 mg | Freq: Once | INTRAMUSCULAR | Status: AC
  Administered 2015-02-05: 4 mg via INTRAVENOUS
  Filled 2015-02-05: qty 2

## 2015-02-05 NOTE — Discharge Instructions (Signed)

## 2015-02-05 NOTE — Telephone Encounter (Signed)
-----   Message from Harolyn Rutherford, [place] sent at 02/05/2015  3:02 PM EDT -----   ----- Message -----    From: Kathee Delton, MD    Sent: 02/01/2015  12:57 PM      To: Harolyn Rutherford, CMA  Tammy, this is a pt who Dr simonds saw in hospital and ordered a sleep study.  Not connected with anyone.  Will need to have sleep consult. Thanks   ----- Message -----    From: Wilhelmina Mcardle, MD    Sent: 02/01/2015  11:25 AM      To: Kathee Delton, MD  Will you ask office staff to arrange sleep eval on him. I simply made a rec for polysomnogram at the time of my sign off note  Waunita Schooner  ----- Message -----    From: Kathee Delton, MD    Sent: 02/01/2015   8:56 AM      To: Wilhelmina Mcardle, MD  Waunita Schooner, please see the results of the sleep study you ordered on this pt.  Thanks.

## 2015-02-05 NOTE — ED Provider Notes (Signed)
CSN: [id number]     Arrival date & time 02/05/15  1154 History   First MD Initiated Contact with Patient 02/05/15 1401     Chief Complaint  Patient presents with  . Flank Pain  . Chest Pain     (Consider location/radiation/quality/duration/timing/severity/associated sxs/prior Treatment) HPI   PCP: OSEI-BONSU,GEORGE, MD Blood pressure 166/96, pulse 73, temperature 98.8 F (37.1 C), temperature source Oral, resp. rate 19, height 6\' 1"  (1.854 m), weight 290 lb 4.8 oz (131.679 kg), SpO2 96 %.  Jerry Cantrell is a 49 y.o.male with a significant PMH of hypertension, hyperlipidemia, meralgia parasethtica, urinary incontinence, hemorrhoids, UTI, neuropathy, substance abuse, alcohol abuse, hyperlipidemia, pneumonia, diabetes mellitus, acute diastolic heart failure, retinopathy, CHF, obesity  presents to the ER with complaints of needing refills on his medications and being unable to follow-up with his PCP because of Medicaid concerns.  He was discharged from the hospital on 5/24 after an extended stay for pneumonia, encephalopathy, respiratory distress. He has been doing and feeling better since discharge, still mildly weak, small amount of chest pain he has had since the pneumonia that is improving. He has known kidney damage. He would like to check his kidneys and see if his pneumonia is improving.  He has his Novolog in his pocket for his diabetes but has been afraid to take any insulin because he does not have test strips to check his glucose.  Past Medical History  Diagnosis Date  . Hypertension   . Hyperlipidemia   . Meralgia paraesthetica 05/03/2012  . Lumbar spondylosis 05/03/2012  . Lumbar spinal stenosis 05/03/2012    Mild with only right L4 nerve root encroachment, no neurogenic claudication   . Urinary incontinence 05/12/2012    Since the start of Sept. 2013   . Hemorrhoids 05/12/2012  . UTI (lower urinary tract infection)   . Meralgia paraesthetica 05/03/2012    On Lyrica which does improve  pain.   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 Substance abuse   . Hyperlipidemia   . Pneumonia   . Diabetes mellitus   . 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 Acute diastolic heart failure   . Retinopathy   . CHF (congestive heart failure)   . Obesity    Past Surgical History  Procedure Laterality Date  . Tonsillectomy    . Abdominal surgery      Abscess I&D 2/2 infected hair  . Colonoscopy w/ polypectomy      pt to bring records   Family History  Problem Relation Age of Onset  . Prostate cancer Father   . Alcohol abuse Father   . Emphysema Father     smoked   History  Substance Use Topics  . Smoking status: Current Every Day Smoker -- 0.40 packs/day for 30 years    Types: Cigarettes  . Smokeless tobacco: Never Used     Comment: "Trying - hard to stop"  . Alcohol Use: 3.6 oz/week    6 Cans of beer per week     Comment: beer    Review of Systems  10 Systems reviewed and are negative for acute change except as noted in the HPI.     Allergies  Review of patient's allergies indicates no known allergies.  Home Medications   Prior to Admission medications   Medication Sig Start Date End Date Taking? Authorizing Provider  albuterol (PROVENTIL HFA;VENTOLIN HFA) 108 (90 BASE) MCG/ACT inhaler Inhale 2 puffs into the lungs every 4 (four) hours as needed for wheezing or  shortness of breath. 01/22/15   Debbe Odea, MD  amitriptyline (ELAVIL) 50 MG tablet Take 50 mg by mouth at bedtime.    Historical Provider, MD  aspirin 325 MG tablet Take 325 mg by mouth daily.    Historical Provider, MD  bag balm OINT ointment Apply 1 application topically as needed for dry skin.    Historical Provider, MD  carvedilol (COREG) 25 MG tablet Take 1 tablet (25 mg total) by mouth 2 (two) times daily with a meal. 06/26/14   Comer Locket, PA-C  cloNIDine (CATAPRES) 0.2 MG tablet Take 1 tablet (0.2 mg total) by mouth 3 (three) times daily. 01/22/15   Debbe Odea, MD  fluticasone  (FLONASE) 50 MCG/ACT nasal spray Place 1-2 sprays into both nostrils 2 (two) times daily.    Historical Provider, MD  furosemide (LASIX) 80 MG tablet Take 1 tablet (80 mg total) by mouth daily as needed for edema. 01/22/15   Debbe Odea, MD  gabapentin (NEURONTIN) 300 MG capsule Take 300 mg by mouth 3 (three) times daily. 200 mg in the morning and 100 mg in the evening.    Historical Provider, MD  gemfibrozil (LOPID) 600 MG tablet Take 600 mg by mouth 2 (two) times daily before a meal.    Historical Provider, MD  hydrALAZINE (APRESOLINE) 100 MG tablet Take 1 tablet (100 mg total) by mouth 3 (three) times daily. 01/22/15   Debbe Odea, MD  insulin aspart (NOVOLOG) 100 UNIT/ML injection Inject 10-30 Units into the skin 3 (three) times daily before meals. Per sliding scale 06/26/14   Comer Locket, PA-C  insulin glargine (LANTUS) 100 UNIT/ML injection Inject 0.4 mLs (40 Units total) into the skin 2 (two) times daily. 01/22/15   Debbe Odea, MD  levofloxacin (LEVAQUIN) 750 MG tablet Take 1 tablet (750 mg total) by mouth daily. 01/22/15   Debbe Odea, MD  nicotine (NICODERM CQ - DOSED IN MG/24 HOURS) 21 mg/24hr patch Place 1 patch (21 mg total) onto the skin daily. 01/22/15   Debbe Odea, MD  nicotine (NICODERM CQ) 14 mg/24hr patch Place 1 patch (14 mg total) onto the skin daily. 02/04/15   Debbe Odea, MD  nicotine (NICODERM CQ) 7 mg/24hr patch Place 1 patch (7 mg total) onto the skin daily. Start when 14 mg patches have finished 02/17/15   Debbe Odea, MD  omega-3 acid ethyl esters (LOVAZA) 1 G capsule Take 2 g by mouth 2 (two) times daily.     Historical Provider, MD  Polyvinyl Alcohol-Povidone (REFRESH OP) Place 2 drops into both eyes 3 (three) times daily as needed (dry eyes).    Historical Provider, MD  verapamil (CALAN-SR) 120 MG CR tablet Take 1 tablet (120 mg total) by mouth at bedtime. Patient taking differently: Take 120 mg by mouth daily.  06/26/14   Benjamin Cartner, PA-C   BP 166/96 mmHg   Pulse 73  Temp(Src) 98.8 F (37.1 C) (Oral)  Resp 19  Ht 6\' 1"  (1.854 m)  Wt 290 lb 4.8 oz (131.679 kg)  BMI 38.31 kg/m2  SpO2 96% Physical Exam  Constitutional: He is oriented to person, place, and time. He appears well-developed and well-nourished. No distress.  HENT:  Head: Normocephalic and atraumatic.  Right Ear: Tympanic membrane and ear canal normal.  Left Ear: Tympanic membrane and ear canal normal.  Eyes: Pupils are equal, round, and reactive to light.  Neck: Normal range of motion. Neck supple. No spinous process tenderness and no muscular tenderness present.  Cardiovascular: Normal rate and  regular rhythm.   Pulmonary/Chest: Effort normal and breath sounds normal. He has no decreased breath sounds. He has no wheezes. He has no rhonchi. He exhibits no tenderness.  Abdominal: Soft. Bowel sounds are normal. He exhibits no distension. There is no tenderness. There is no rigidity.  Musculoskeletal: Normal range of motion.  No LE swelling  Neurological: He is alert and oriented to person, place, and time.  Skin: Skin is warm and dry. He is not diaphoretic.  Nursing note and vitals reviewed.   ED Course  Procedures (including critical care time) Labs Review Labs Reviewed  BASIC METABOLIC PANEL - Abnormal; Notable for the following:    Sodium 134 (*)    Chloride 89 (*)    CO2 33 (*)    Glucose, Bld 404 (*)    BUN 28 (*)    Creatinine, Ser 2.34 (*)    GFR calc non Af Amer 31 (*)    GFR calc Af Amer 36 (*)    All other components within normal limits  URINALYSIS, ROUTINE W REFLEX MICROSCOPIC (NOT AT [facility]) - Abnormal; Notable for the following:    APPearance HAZY (*)    Glucose, UA >1000 (*)    Hgb urine dipstick MODERATE (*)    Protein, ur >300 (*)    All other components within normal limits  URINE MICROSCOPIC-ADD ON - Abnormal; Notable for the following:    Casts HYALINE CASTS (*)    All other components within normal limits  CBG MONITORING, ED - Abnormal; Notable  for the following:    Glucose-Capillary 405 (*)    All other components within normal limits  CBC  I-STAT TROPOININ, ED    Imaging Review Dg Chest 2 View  02/05/2015   CLINICAL DATA:  LEFT chest pain congestion, RIGHT flank pain, headache, recent pneumonia, history hypertension  EXAM: CHEST  2 VIEW  COMPARISON:  [facility] 22,016  FINDINGS: Enlargement of cardiac silhouette.  Mediastinal contours and pulmonary vascularity normal.  Improved RIGHT upper lobe infiltrate.  Subsegmental atelectasis LEFT mid lung.  Remaining lungs clear.  No pleural effusion or pneumothorax.  Multilevel endplate spur formation thoracic spine.  IMPRESSION: Improved RIGHT upper lobe pneumonia.  Subsegmental atelectasis LEFT upper lobe.  Enlargement of cardiac silhouette.   Electronically Signed   By: Lavonia Dana M.D.   On: 02/05/2015 13:16     EKG Interpretation   Date/Time:  Tuesday February 05 2015 12:02:04 EDT Ventricular Rate:  85 PR Interval:  218 QRS Duration: 114 QT Interval:  398 QTC Calculation: 473 R Axis:   -63 Text Interpretation:  Sinus rhythm with 1st degree A-V block Possible Left  atrial enlargement Left anterior fascicular block Anterior infarct , age  undetermined Abnormal ECG No significant change since May 2016 Confirmed  by Regenia Skeeter  MD, SCOTT [phone]) on 02/05/2015 2:48:52 PM      MDM   Final diagnoses:  None    Dr. Regenia Skeeter has seen patient. Labs and imaging are mildly worse than normal but not significantly so that the patient warrants admission. The patient is feeling better, requests refills of his medications and requests to be discharged. He is agreeable to get a liter of normal saline before discharge to help correct his insulin. I will refill all of his medications.  Patient to be discharged after 1 L fluids complete.  Medications  morphine 4 MG/ML injection 4 mg (0 mg Intravenous Hold 02/05/15 1528)  sodium chloride 0.9 % bolus 1,000 mL (1,000 mLs Intravenous New Bag/Given  02/05/15  1557)  morphine 4 MG/ML injection 4 mg (4 mg Intravenous Given 02/05/15 1437)  ondansetron (ZOFRAN) injection 4 mg (4 mg Intravenous Given 02/05/15 1438)    49 y.o.Olivia Mackie Porro's evaluation in the Emergency Department is complete. It has been determined that no acute conditions requiring further emergency intervention are present at this time. The patient/guardian have been advised of the diagnosis and plan. We have discussed signs and symptoms that warrant return to the ED, such as changes or worsening in symptoms.  Vital signs are stable at discharge. Filed Vitals:   02/05/15 1430  BP: 166/96  Pulse: 73  Temp:   Resp: 19    Patient/guardian has voiced understanding and agreed to follow-up with the PCP or specialist.     Delos Haring, PA-C 02/05/15 [place], MD 02/09/15 2306

## 2015-02-05 NOTE — ED Notes (Signed)
Pt recently discharged for PNA and started having left sided cp 2 days ago and feels funny. Also started having right flank pain since he got out of the hospital 2 weeks ago. Has been drinking cranberry juice.

## 2015-02-05 NOTE — ED Notes (Signed)
Dr. Goldston at bedside.  

## 2015-02-19 ENCOUNTER — Telehealth: Payer: Self-pay | Admitting: Pulmonary Disease

## 2015-02-19 ENCOUNTER — Telehealth: Payer: Self-pay | Admitting: *Deleted

## 2015-02-19 NOTE — Telephone Encounter (Signed)
Spoke with pt and given appt with Dr Halford Chessman on 03/15/15 at 4:00 for sleep consult

## 2015-02-22 DIAGNOSIS — F17218 Nicotine dependence, cigarettes, with other nicotine-induced disorders: Secondary | ICD-10-CM | POA: Insufficient documentation

## 2015-02-22 DIAGNOSIS — H543 Unqualified visual loss, both eyes: Secondary | ICD-10-CM | POA: Insufficient documentation

## 2015-02-22 NOTE — Telephone Encounter (Signed)
PATIENT HAS TRANSFERRED CARE TO DR Iona Beard OSEI-BONSU NO LONGER PATIENT OF [facility]/06-07-14

## 2015-03-15 ENCOUNTER — Telehealth: Payer: Self-pay | Admitting: Pulmonary Disease

## 2015-03-15 ENCOUNTER — Institutional Professional Consult (permissible substitution): Payer: Self-pay | Admitting: Pulmonary Disease

## 2015-03-15 NOTE — Telephone Encounter (Signed)
Spoke with pt. He wanted to see if we could set him up for a CPAP without seeing him. Informed pt we couldn't but I offered to set him up with another appt. He refused and stated that he wouldn't worry about it since every time he has an appt transportation doesn't show up. Informed him to call back if he changed his mind. Nothing further needed.

## 2015-03-21 ENCOUNTER — Other Ambulatory Visit: Payer: Self-pay | Admitting: Nephrology

## 2015-03-21 DIAGNOSIS — N183 Chronic kidney disease, stage 3 unspecified: Secondary | ICD-10-CM

## 2015-03-26 DIAGNOSIS — Z6841 Body Mass Index (BMI) 40.0 and over, adult: Secondary | ICD-10-CM

## 2015-03-29 ENCOUNTER — Other Ambulatory Visit: Payer: Self-pay

## 2015-03-29 ENCOUNTER — Ambulatory Visit
Admission: RE | Admit: 2015-03-29 | Discharge: 2015-03-29 | Disposition: A | Discharge status: Home / Self Care | Payer: Medicaid Other | Source: Ambulatory Visit | Attending: Nephrology | Admitting: Nephrology

## 2015-03-29 DIAGNOSIS — N183 Chronic kidney disease, stage 3 unspecified: Secondary | ICD-10-CM

## 2015-04-19 NOTE — Telephone Encounter (Signed)
Jerry Cantrell  He needs sleep follow up in [facility]

## 2015-05-13 ENCOUNTER — Emergency Department (HOSPITAL_COMMUNITY): Payer: Medicaid Other

## 2015-05-13 ENCOUNTER — Inpatient Hospital Stay (HOSPITAL_COMMUNITY)
Admission: EM | Admit: 2015-05-13 | Discharge: 2015-05-18 | DRG: 291 | Disposition: A | Discharge status: Home / Self Care | Payer: Medicaid Other | Attending: Family Medicine | Admitting: Family Medicine

## 2015-05-13 ENCOUNTER — Encounter (HOSPITAL_COMMUNITY): Payer: Self-pay | Admitting: Family Medicine

## 2015-05-13 DIAGNOSIS — F329 Major depressive disorder, single episode, unspecified: Secondary | ICD-10-CM | POA: Diagnosis present

## 2015-05-13 DIAGNOSIS — E876 Hypokalemia: Secondary | ICD-10-CM | POA: Diagnosis not present

## 2015-05-13 DIAGNOSIS — Z6841 Body Mass Index (BMI) 40.0 and over, adult: Secondary | ICD-10-CM

## 2015-05-13 DIAGNOSIS — Z9981 Dependence on supplemental oxygen: Secondary | ICD-10-CM

## 2015-05-13 DIAGNOSIS — E669 Obesity, unspecified: Secondary | ICD-10-CM | POA: Diagnosis present

## 2015-05-13 DIAGNOSIS — I248 Other forms of acute ischemic heart disease: Secondary | ICD-10-CM | POA: Diagnosis present

## 2015-05-13 DIAGNOSIS — I5033 Acute on chronic diastolic (congestive) heart failure: Principal | ICD-10-CM | POA: Diagnosis present

## 2015-05-13 DIAGNOSIS — I10 Essential (primary) hypertension: Secondary | ICD-10-CM | POA: Diagnosis not present

## 2015-05-13 DIAGNOSIS — G4733 Obstructive sleep apnea (adult) (pediatric): Secondary | ICD-10-CM | POA: Diagnosis present

## 2015-05-13 DIAGNOSIS — Z7982 Long term (current) use of aspirin: Secondary | ICD-10-CM | POA: Diagnosis not present

## 2015-05-13 DIAGNOSIS — Z794 Long term (current) use of insulin: Secondary | ICD-10-CM

## 2015-05-13 DIAGNOSIS — Z79899 Other long term (current) drug therapy: Secondary | ICD-10-CM | POA: Diagnosis not present

## 2015-05-13 DIAGNOSIS — R7989 Other specified abnormal findings of blood chemistry: Secondary | ICD-10-CM | POA: Diagnosis not present

## 2015-05-13 DIAGNOSIS — N179 Acute kidney failure, unspecified: Secondary | ICD-10-CM | POA: Diagnosis present

## 2015-05-13 DIAGNOSIS — R0603 Acute respiratory distress: Secondary | ICD-10-CM | POA: Diagnosis present

## 2015-05-13 DIAGNOSIS — Z7951 Long term (current) use of inhaled steroids: Secondary | ICD-10-CM

## 2015-05-13 DIAGNOSIS — E1142 Type 2 diabetes mellitus with diabetic polyneuropathy: Secondary | ICD-10-CM | POA: Diagnosis present

## 2015-05-13 DIAGNOSIS — J9601 Acute respiratory failure with hypoxia: Secondary | ICD-10-CM | POA: Diagnosis present

## 2015-05-13 DIAGNOSIS — R079 Chest pain, unspecified: Secondary | ICD-10-CM | POA: Diagnosis not present

## 2015-05-13 DIAGNOSIS — E785 Hyperlipidemia, unspecified: Secondary | ICD-10-CM | POA: Diagnosis present

## 2015-05-13 DIAGNOSIS — R0600 Dyspnea, unspecified: Secondary | ICD-10-CM | POA: Diagnosis not present

## 2015-05-13 DIAGNOSIS — E1121 Type 2 diabetes mellitus with diabetic nephropathy: Secondary | ICD-10-CM | POA: Diagnosis present

## 2015-05-13 DIAGNOSIS — R918 Other nonspecific abnormal finding of lung field: Secondary | ICD-10-CM | POA: Diagnosis present

## 2015-05-13 DIAGNOSIS — F1490 Cocaine use, unspecified, uncomplicated: Secondary | ICD-10-CM | POA: Diagnosis present

## 2015-05-13 DIAGNOSIS — J90 Pleural effusion, not elsewhere classified: Secondary | ICD-10-CM | POA: Insufficient documentation

## 2015-05-13 DIAGNOSIS — F17210 Nicotine dependence, cigarettes, uncomplicated: Secondary | ICD-10-CM | POA: Diagnosis present

## 2015-05-13 DIAGNOSIS — E1122 Type 2 diabetes mellitus with diabetic chronic kidney disease: Secondary | ICD-10-CM | POA: Diagnosis present

## 2015-05-13 DIAGNOSIS — I129 Hypertensive chronic kidney disease with stage 1 through stage 4 chronic kidney disease, or unspecified chronic kidney disease: Secondary | ICD-10-CM | POA: Diagnosis present

## 2015-05-13 DIAGNOSIS — I509 Heart failure, unspecified: Secondary | ICD-10-CM

## 2015-05-13 DIAGNOSIS — H53452 Other localized visual field defect, left eye: Secondary | ICD-10-CM | POA: Diagnosis present

## 2015-05-13 DIAGNOSIS — N183 Chronic kidney disease, stage 3 (moderate): Secondary | ICD-10-CM | POA: Diagnosis present

## 2015-05-13 DIAGNOSIS — E11351 Type 2 diabetes mellitus with proliferative diabetic retinopathy with macular edema: Secondary | ICD-10-CM | POA: Diagnosis present

## 2015-05-13 DIAGNOSIS — R778 Other specified abnormalities of plasma proteins: Secondary | ICD-10-CM

## 2015-05-13 DIAGNOSIS — I272 Other secondary pulmonary hypertension: Secondary | ICD-10-CM | POA: Diagnosis present

## 2015-05-13 DIAGNOSIS — H548 Legal blindness, as defined in USA: Secondary | ICD-10-CM | POA: Diagnosis present

## 2015-05-13 LAB — CBC
HEMATOCRIT: 50.6 % (ref 39.0–52.0)
HEMOGLOBIN: 15.8 g/dL (ref 13.0–17.0)
MCH: 30.3 pg (ref 26.0–34.0)
MCHC: 31.2 g/dL (ref 30.0–36.0)
MCV: 97.1 fL (ref 78.0–100.0)
PLATELETS: 149 10*3/uL — AB (ref 150–400)
RBC: 5.21 MIL/uL (ref 4.22–5.81)
RDW: 16.3 % — ABNORMAL HIGH (ref 11.5–15.5)
WBC: 6.9 10*3/uL (ref 4.0–10.5)

## 2015-05-13 LAB — BASIC METABOLIC PANEL
Anion gap: 8 (ref 5–15)
BUN: 29 mg/dL — ABNORMAL HIGH (ref 6–20)
CHLORIDE: 105 mmol/L (ref 101–111)
CO2: 30 mmol/L (ref 22–32)
CREATININE: 2.35 mg/dL — AB (ref 0.61–1.24)
Calcium: 8.6 mg/dL — ABNORMAL LOW (ref 8.9–10.3)
GFR calc non Af Amer: 31 mL/min — ABNORMAL LOW (ref >60)
GFR, EST AFRICAN AMERICAN: 36 mL/min — AB (ref >60)
Glucose, Bld: 238 mg/dL — ABNORMAL HIGH (ref 65–99)
POTASSIUM: 3.9 mmol/L (ref 3.5–5.1)
Sodium: 143 mmol/L (ref 135–145)

## 2015-05-13 LAB — GLUCOSE, CAPILLARY: GLUCOSE-CAPILLARY: 434 mg/dL — AB (ref 65–99)

## 2015-05-13 LAB — I-STAT TROPONIN, ED: TROPONIN I, POC: 0.24 ng/mL — AB (ref 0.00–0.08)

## 2015-05-13 LAB — TROPONIN I
TROPONIN I: 0.17 ng/mL — AB (ref <0.031)
Troponin I: 0.24 ng/mL — ABNORMAL HIGH (ref <0.031)
Troponin I: 0.23 ng/mL — ABNORMAL HIGH (ref <0.031)

## 2015-05-13 LAB — MRSA PCR SCREENING: MRSA BY PCR: NEGATIVE

## 2015-05-13 LAB — HEPARIN LEVEL (UNFRACTIONATED)

## 2015-05-13 LAB — BRAIN NATRIURETIC PEPTIDE: B Natriuretic Peptide: 176.5 pg/mL — ABNORMAL HIGH (ref 0.0–100.0)

## 2015-05-13 MED ORDER — INSULIN ASPART 100 UNIT/ML ~~LOC~~ SOLN
0.0000 [IU] | Freq: Every day | SUBCUTANEOUS | Status: DC
  Administered 2015-05-14: 5 [IU] via SUBCUTANEOUS
  Administered 2015-05-15: 4 [IU] via SUBCUTANEOUS
  Administered 2015-05-16: 3 [IU] via SUBCUTANEOUS

## 2015-05-13 MED ORDER — MORPHINE SULFATE (PF) 2 MG/ML IV SOLN
2.0000 mg | INTRAVENOUS | Status: DC | PRN
Start: 2015-05-13 — End: 2015-05-18
  Administered 2015-05-17: 2 mg via INTRAVENOUS
  Filled 2015-05-13: qty 1

## 2015-05-13 MED ORDER — INSULIN GLARGINE 100 UNIT/ML ~~LOC~~ SOLN
20.0000 [IU] | Freq: Every day | SUBCUTANEOUS | Status: AC
  Administered 2015-05-13: 20 [IU] via SUBCUTANEOUS
  Filled 2015-05-13 (×2): qty 0.2

## 2015-05-13 MED ORDER — INSULIN ASPART 100 UNIT/ML ~~LOC~~ SOLN
0.0000 [IU] | Freq: Three times a day (TID) | SUBCUTANEOUS | Status: DC
  Administered 2015-05-14: 20 [IU] via SUBCUTANEOUS

## 2015-05-13 MED ORDER — FUROSEMIDE 10 MG/ML IJ SOLN
80.0000 mg | Freq: Two times a day (BID) | INTRAMUSCULAR | Status: DC
  Administered 2015-05-13 – 2015-05-16 (×5): 80 mg via INTRAVENOUS
  Filled 2015-05-13 (×8): qty 8

## 2015-05-13 MED ORDER — PREDNISONE 20 MG PO TABS
60.0000 mg | ORAL_TABLET | ORAL | Status: AC
  Administered 2015-05-13: 60 mg via ORAL
  Filled 2015-05-13: qty 3

## 2015-05-13 MED ORDER — NICOTINE 14 MG/24HR TD PT24
14.0000 mg | MEDICATED_PATCH | Freq: Every day | TRANSDERMAL | Status: DC | PRN
  Administered 2015-05-15 – 2015-05-16 (×2): 14 mg via TRANSDERMAL
  Filled 2015-05-13 (×4): qty 1

## 2015-05-13 MED ORDER — INFLUENZA VAC SPLIT QUAD 0.5 ML IM SUSY
0.5000 mL | PREFILLED_SYRINGE | INTRAMUSCULAR | Status: AC
  Administered 2015-05-15: 0.5 mL via INTRAMUSCULAR
  Filled 2015-05-13: qty 0.5

## 2015-05-13 MED ORDER — ALBUTEROL SULFATE (2.5 MG/3ML) 0.083% IN NEBU: 2.5000 mg | INHALATION_SOLUTION | RESPIRATORY_TRACT | Status: AC | PRN

## 2015-05-13 MED ORDER — ATORVASTATIN CALCIUM 40 MG PO TABS
40.0000 mg | ORAL_TABLET | Freq: Every day | ORAL | Status: DC
  Administered 2015-05-14: 40 mg via ORAL
  Filled 2015-05-13 (×2): qty 1

## 2015-05-13 MED ORDER — ACETAMINOPHEN 325 MG PO TABS
650.0000 mg | ORAL_TABLET | Freq: Four times a day (QID) | ORAL | Status: DC | PRN
  Administered 2015-05-14 – 2015-05-18 (×6): 650 mg via ORAL
  Filled 2015-05-13 (×7): qty 2

## 2015-05-13 MED ORDER — ASPIRIN 325 MG PO TABS
325.0000 mg | ORAL_TABLET | Freq: Every day | ORAL | Status: DC
  Administered 2015-05-14 – 2015-05-18 (×5): 325 mg via ORAL
  Filled 2015-05-13 (×5): qty 1

## 2015-05-13 MED ORDER — HEPARIN BOLUS VIA INFUSION
4000.0000 [IU] | Freq: Once | INTRAVENOUS | Status: AC
  Administered 2015-05-13: 4000 [IU] via INTRAVENOUS
  Filled 2015-05-13: qty 4000

## 2015-05-13 MED ORDER — INSULIN ASPART 100 UNIT/ML ~~LOC~~ SOLN
15.0000 [IU] | Freq: Once | SUBCUTANEOUS | Status: AC
  Administered 2015-05-13: 15 [IU] via SUBCUTANEOUS

## 2015-05-13 MED ORDER — CLONIDINE HCL 0.2 MG PO TABS
0.2000 mg | ORAL_TABLET | Freq: Three times a day (TID) | ORAL | Status: DC
  Administered 2015-05-13 – 2015-05-18 (×14): 0.2 mg via ORAL
  Filled 2015-05-13 (×15): qty 1

## 2015-05-13 MED ORDER — CETYLPYRIDINIUM CHLORIDE 0.05 % MT LIQD
7.0000 mL | Freq: Two times a day (BID) | OROMUCOSAL | Status: DC
  Administered 2015-05-14 – 2015-05-18 (×6): 7 mL via OROMUCOSAL

## 2015-05-13 MED ORDER — SODIUM CHLORIDE 0.9 % IJ SOLN
3.0000 mL | Freq: Two times a day (BID) | INTRAMUSCULAR | Status: DC
  Administered 2015-05-14 – 2015-05-18 (×8): 3 mL via INTRAVENOUS

## 2015-05-13 MED ORDER — INSULIN GLARGINE 100 UNIT/ML ~~LOC~~ SOLN
40.0000 [IU] | Freq: Every day | SUBCUTANEOUS | Status: DC
  Administered 2015-05-14: 40 [IU] via SUBCUTANEOUS
  Filled 2015-05-13: qty 0.4

## 2015-05-13 MED ORDER — HEPARIN BOLUS VIA INFUSION
3000.0000 [IU] | Freq: Once | INTRAVENOUS | Status: AC
  Administered 2015-05-13: 3000 [IU] via INTRAVENOUS
  Filled 2015-05-13: qty 3000

## 2015-05-13 MED ORDER — FLUTICASONE PROPIONATE 50 MCG/ACT NA SUSP
1.0000 | Freq: Two times a day (BID) | NASAL | Status: DC
  Administered 2015-05-13 – 2015-05-15 (×2): 2 via NASAL
  Administered 2015-05-15 – 2015-05-16 (×2): 1 via NASAL
  Administered 2015-05-16: 2 via NASAL
  Filled 2015-05-13 (×3): qty 16

## 2015-05-13 MED ORDER — CARVEDILOL 25 MG PO TABS
25.0000 mg | ORAL_TABLET | Freq: Two times a day (BID) | ORAL | Status: DC
  Administered 2015-05-13 – 2015-05-18 (×10): 25 mg via ORAL
  Filled 2015-05-13 (×13): qty 1

## 2015-05-13 MED ORDER — ACETAMINOPHEN 650 MG RE SUPP: 650.0000 mg | Freq: Four times a day (QID) | RECTAL | Status: DC | PRN

## 2015-05-13 MED ORDER — HEPARIN (PORCINE) IN NACL 100-0.45 UNIT/ML-% IJ SOLN
2500.0000 [IU]/h | INTRAMUSCULAR | Status: DC
  Administered 2015-05-13: 1300 [IU]/h via INTRAVENOUS
  Filled 2015-05-13 (×3): qty 250

## 2015-05-13 NOTE — Progress Notes (Signed)
ANTICOAGULATION CONSULT NOTE - Follow Up Consult  Pharmacy Consult for heparin Indication: chest pain/ACS  Labs:  Recent Labs  05/13/15 1142 05/13/15 1610 05/13/15 2203  HGB 15.8  --   --   HCT 50.6  --   --   PLT 149*  --   --   HEPARINUNFRC  --   --  <0.10*  CREATININE 2.35*  --   --   TROPONINI 0.24* 0.23*  --     Assessment: 49yo male undetectable on heparin with initial dosing for CP.  Goal of Therapy:  Heparin level 0.3-0.7 units/ml   Plan:  Will rebolus with heparin 3000 units and increase gtt by 4 units/kg/hr to 1800 units/hr and check level with am labs.  Wynona Neat, PharmD, BCPS  05/13/2015,10:55 PM

## 2015-05-13 NOTE — H&P (Signed)
[facility] Admission History and Physical Service Pager: [phone]  Patient name: Jerry Cantrell Medical record number: [id number] Date of birth: 02-01-1966 Age: 49 y.o. Gender: male  Primary Care Provider: Dr. Nani Skillern, [place] Consultants: Cardiology, Pharmacy Code Status: FULL  Chief Complaint: Dyspnea, Edema, Chest Tightness  Assessment and Plan: [name] is a 49 y.o. male presenting with one month of progressive dyspnea and lower extremity edema, as well as two days of chest tightness. PMH is significant for diastolic HFpEF, HTN, HLD, T2DM, CKD Stage III, OSA, and tobacco use.  CHF Exacerbation, ACS r/o: Most recent echo (01/18/15) with EF 50-55% and grade 1 diastolic dysfunction. Most likely diagnosis in this patient with history of HFpEF and progressive shortness of breath and edema is CHF exacerbation. This diagnosis is further supported by CXR notable for changes suggestive of volume overload and an elevated BNP. Of note, the patient has multiple risk factors for CAD including obesity, HLD, and T2DM and admission EKG was concerning for possible septal infarct with mild troponin elevation. Also of note, patient with recent admission for community acquired pneumonia complicated by sepsis, acute encephalopathy, and acute renal failure. CXR on admission shows resolution of previous RUL pneumonia with subsequent scarring, but is notable for a new dense opacity at the right lung base that could represent pneumonia or atelectasis. On presentation, he is without leukocytosis and has been afebrile; however, pneumonia remains on the differential diagnosis. - Considering new 6L O2 requirement, will admit to step down. - Cardiology following, appreciate recommendations. - Telemetry - IV Lasix 80 mg q12h - Strict I&Os - Heparin drip until cardiac issues evaluated - Limited echocardiogram to evaluate left ventricular function - Trending troponins - Fasting  lipid panel for risk stratification - Continue to counsel regarding use of tobacco and cocaine - O2 by [place], goal O2 sat >92%  Hypertension: Patient on carvedilol 25 mg PO BID, clonidine 0.2 mg PO BID, hydralazine 10 mg PO TID, metolazone 5 mg PO daily, and verapamil 120 mg ER qhs. - Continue carvedilol 25 mg PO BID and clonidine 0.2 mg PO BID - Hydralazine 5 mg IV PRN for systolics [id number] or diastolics [id number] - Holding other antihypertensives for now  OSA: During previous admissions, patient became severely hypoxic while asleep. He was evaluated recently on an outpatient basis and deemed appropriate for CPAP, but never had this delivered. - CPAP during admission  AKI on CKD Stage III: Baseline Cr 1.3-1.5; Cr 2.35 on admission with BUN:Cr <20:1. Likely secondary to volume overload. Followed by Nephrologist, Dr. Justin Mend. Patient reports that he was recently placed on the transplant list. - IV Lasix as above - Daily BMP, CTM  Type II Diabetes Mellitus: Most recent A1c 8.2 (05/02/15). Currently on Lantus 35 U qhs and 40 U qAM. Per note by PCP, blood glucose suboptimally controlled and he was instructed to increase to Lantus 50 U BID (05/02/15); however, he was concerned regarding AM lows and did not increase his Lantus dose as instructed. - Moderate SSI - Lantus 40 U qAM  Depression: Stable on Elavil. - Hold home Elavil 50 mg PO qhs while in SDU  Tobacco Use: Currently attempting cessation with nicotine patches. - Nicotine patch prn during admission - Continue encouragement of cessation  FEN/GI: NPO Prophylaxis: Heparin drip  Disposition: Admit to step down unit.  History of Present Illness:  Jerry Cantrell is a 49 y.o. male presenting with one month of progressive dyspnea and lower extremity edema, as well as  two days of chest tightness.   He reports one month of dyspnea with concurrent rhinorrhea; attributes this to a ragweed allergy. This dyspnea was initially only present with exertion, but has  progressed to the point that he is now short of breath even at rest. He does have a PRN albuterol inhaler at home, and states that during ragweed season he sometimes uses this up to 4 puffs in an hour without relief; he does not use a spacer or nebulizer. Also reports one month of progressive lower extremity edema. He was seen by his PCP last week for the complaint of volume overload, and reports that he had gained 17 lbs in 30 days at the time of that appointment. Although it was not recommended by his PCP, he has doubled his dose of Lasix from 80 mg daily to 160 mg daily for the past week as attempted diuresis. Denies PND or a change from his typical two pillow orthopnea. He also endorses chest tightness over the past two days, with arm weakness today. Chest tightness is intermittent and exacerbated with deep breathing or bending over; occurs both at rest and with exertion. He does endorse a productive cough that is unchanged from his baseline; sputum neither green nor brown, but he is unable to further characterize the sputum.  Review Of Systems: Per HPI with the following additions: Denies fevers, chills, and night sweats. Denies wheezing. Denies abdominal pain, nausea, vomiting, diarrhea, and constipation. Denies dysuria and urinary frequency. Otherwise 12 point review of systems was performed and was unremarkable.  Patient Active Problem List   Diagnosis Date Noted  . Chest pain at rest 05/13/2015  . Respiratory distress 05/13/2015  . CAP (community acquired pneumonia) 01/18/2015  . Acute renal failure superimposed on stage 3 chronic kidney disease 01/18/2015  . Respiratory acidosis   . Thrombocytopenia 12/04/2013  . Diastolic heart failure secondary to hypertension 11/30/2013  . Radiculopathy with lower extremity symptoms 11/28/2013  . Dyspnea 09/16/2013  . Depression 06/16/2013  . Proliferative diabetic retinopathy 06/08/2013  . Diabetic macular edema of left eye, with cataract, associated  with type 2 diabetes mellitus 06/08/2013  . Meibomianitis 06/08/2013  . Erectile dysfunction associated with type 2 diabetes mellitus 04/22/2013  . Decreased peripheral vision of left eye 04/12/2013  . Plantar fasciitis of right foot 04/12/2013  . Cough 02/09/2013  . Obstructive sleep apnea 02/02/2013  . Diabetic peripheral neuropathy associated with type 2 diabetes mellitus 12/27/2012  . Abnormality of gait 12/06/2012  . Family history of malignant neoplasm of gastrointestinal tract 10/10/2012  . Personal history of colonic polyps 10/10/2012  . Smoker 08/18/2012  . Skin tag 08/18/2012  . Chronic low back pain 07/15/2012  . Preventative health care 06/02/2012  . Hyperlipemia 05/12/2012  . Hypertension 05/12/2012  . Urinary incontinence 05/12/2012  . Hemorrhoids 05/12/2012  . Blood per rectum 05/12/2012  . Meralgia paraesthetica 05/03/2012  . Lumbar spondylosis 05/03/2012  . Lumbar spinal stenosis 05/03/2012  . Insulin dependent type 2 diabetes mellitus, uncontrolled 05/13/1999   Past Medical History: Past Medical History  Diagnosis Date  . Hypertension   . Hyperlipidemia   . Meralgia paraesthetica 05/03/2012  . Lumbar spondylosis 05/03/2012  . Lumbar spinal stenosis 05/03/2012    Mild with only right L4 nerve root encroachment, no neurogenic claudication   . Urinary incontinence 05/12/2012    Since the start of Sept. 2013   . Hemorrhoids 05/12/2012  . UTI (lower urinary tract infection)   . Meralgia paraesthetica 05/03/2012  On Lyrica which does improve pain.   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 Substance abuse   . Hyperlipidemia   . Pneumonia   . Diabetes mellitus   . 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 Acute diastolic heart failure   . Retinopathy   . CHF (congestive heart failure)   . Obesity    Past Surgical History: Past Surgical History  Procedure Laterality Date  . Tonsillectomy    . Abdominal surgery      Abscess I&D 2/2 infected hair  .  Colonoscopy w/ polypectomy      pt to bring records   Social History: Social History  Substance Use Topics  . Smoking status: Current Every Day Smoker -- 0.40 packs/day for 30 years    Types: Cigarettes  . Smokeless tobacco: Never Used     Comment: "Trying - hard to stop"  . Alcohol Use: 3.6 oz/week    6 Cans of beer per week     Comment: beer   Additional Social History: Currently attempting to quit use of tobacco with nicotine patch use. Of note, current significant home stress, as his son will be leaving for prison on Monday. Reports alcohol use q3 days, and will typically consume two 40 oz bottles during a drinking episode; very heavy alcohol use in the past, though he is unable to quantify. Also reports irregular cocaine use, most recently 9/9. Please also refer to relevant sections of EMR.  Family History: Prostate Cancer: Father Alcohol Abuse: Father Emphysema: Father, smoked  Denies family history of cardiac disease/ACS.  Allergies and Medications: No Known Allergies No current facility-administered medications on file prior to encounter.   Current Outpatient Prescriptions on File Prior to Encounter  Medication Sig Dispense Refill  . albuterol (PROVENTIL HFA;VENTOLIN HFA) 108 (90 BASE) MCG/ACT inhaler Inhale 2 puffs into the lungs every 4 (four) hours as needed for wheezing or shortness of breath. 1 Inhaler 6  . amitriptyline (ELAVIL) 50 MG tablet Take 1 tablet (50 mg total) by mouth at bedtime. 30 tablet 1  . aspirin 325 MG tablet Take 1 tablet (325 mg total) by mouth daily. 30 tablet 0  . carvedilol (COREG) 25 MG tablet Take 1 tablet (25 mg total) by mouth 2 (two) times daily with a meal. 28 tablet 0  . cloNIDine (CATAPRES) 0.2 MG tablet Take 1 tablet (0.2 mg total) by mouth 3 (three) times daily. 90 tablet 0  . fluticasone (FLONASE) 50 MCG/ACT nasal spray Place 1-2 sprays into both nostrils 2 (two) times daily. 9.9 g 0  . furosemide (LASIX) 80 MG tablet Take 1 tablet (80  mg total) by mouth daily as needed for edema. 10 tablet 0  . gabapentin (NEURONTIN) 300 MG capsule Take 1 capsule (300 mg total) by mouth 3 (three) times daily. 200 mg in the morning and 100 mg in the evening. 90 capsule 0  . gemfibrozil (LOPID) 600 MG tablet Take 1 tablet (600 mg total) by mouth 2 (two) times daily before a meal. 60 tablet 1  . hydrALAZINE (APRESOLINE) 100 MG tablet Take 1 tablet (100 mg total) by mouth 3 (three) times daily. 90 tablet 3  . insulin aspart (NOVOLOG) 100 UNIT/ML injection Inject 10-30 Units into the skin 3 (three) times daily before meals. Per sliding scale 10 mL 0  . insulin glargine (LANTUS) 100 UNIT/ML injection Inject 0.4 mLs (40 Units total) into the skin 2 (two) times daily. (Patient taking differently: Inject 35-47 Units into the skin 2 (two)  times daily. Take 47 units in the morning, and 35 units at bedtime) 10 mL 0  . nicotine (NICODERM CQ - DOSED IN MG/24 HOURS) 21 mg/24hr patch Place 1 patch (21 mg total) onto the skin daily. 14 patch 0  . verapamil (CALAN-SR) 120 MG CR tablet Take 1 tablet (120 mg total) by mouth at bedtime. 8 tablet 0  . bag balm OINT ointment Apply 1 application topically as needed for dry skin.    Marland Kitchen glucose blood test strip Use as instructed 100 each 12  . levofloxacin (LEVAQUIN) 750 MG tablet Take 1 tablet (750 mg total) by mouth daily. (Patient not taking: Reported on 05/13/2015) 5 tablet 0  . nicotine (NICODERM CQ) 14 mg/24hr patch Place 1 patch (14 mg total) onto the skin daily. (Patient not taking: Reported on 05/13/2015) 14 patch 0  . nicotine (NICODERM CQ) 7 mg/24hr patch Place 1 patch (7 mg total) onto the skin daily. Start when 14 mg patches have finished (Patient not taking: Reported on 05/13/2015) 14 patch 0  . omega-3 acid ethyl esters (LOVAZA) 1 G capsule Take 2 g by mouth 2 (two) times daily.     . Polyvinyl Alcohol-Povidone (REFRESH OP) Place 2 drops into both eyes 3 (three) times daily as needed (dry eyes).       Objective: BP 136/84 mmHg  Pulse 61  Temp(Src) 98.1 F (36.7 C)  Resp 18  Ht 6\' 1"  (1.854 m)  Wt 139.424 kg (307 lb 6 oz)  BMI 40.56 kg/m2  SpO2 95%  Exam: General: Obese patient lying in bed, in no acute distress. HEENT: Normocephalic and atraumatic. Eyes unfocused with lag of the lids bilaterally. Conjunctivae clear and sclera anicteric. No nasal discharge, though patient repeatedly sniffling throughout exam. Moist mucous membranes. Neck: Supple with full range of motion. Cardiovascular: RRR without murmur/rub/gallop. Distant heart sounds. Respiratory: Normal work of breathing on  Abdomen: Soft, non-tender and non-distended. +bowel sounds. No abdominal masses or organomegaly appreciated. Extremities: Warm and well-perfused. Bilateral 2+ pitting edema to the level of the knees. Pitting pre-sacral edema and edema of the hands also present. Skin: Scattered comedones across the back. Multiple tattoos. No lesions or rashes appreciated. Neuro: Alert and oriented x3. No focal abnormalities appreciated. Moving all four extremities. Psych: Appropriate and conversant.  Labs and Imaging: CBC BMET   Recent Labs Lab 05/13/15 1142  WBC 6.9  HGB 15.8  HCT 50.6  PLT 149*    Recent Labs Lab 05/13/15 1142  NA 143  K 3.9  CL 105  CO2 30  BUN 29*  CREATININE 2.35*  GLUCOSE 238*  CALCIUM 8.6*     ECHO May 2016 Left ventricle: Abnormal septal motion The cavity size was mildly dilated. There was mild concentric hypertrophy. Systolic function was normal. The estimated ejection fraction was in the range of 55% to 60%. Wall motion was normal; there were no regional wall motion abnormalities. - Left atrium: The atrium was mildly dilated. - Atrial septum: There was increased thickness of the septum, consistent with lipomatous hypertrophy. No defect or patent foramen ovale was identified. - Pulmonary arteries: PA peak pressure: 41 mm Hg (S).  Troponin: 0.24 >>  0.23 BNP: 176.5  EKG: Monday May 13 2015 11:44:07 EDT Ventricular Rate: 68 PR Interval: 184 QRS Duration: 122 QT Interval: 438 QTC Calculation: 465 R Axis:-48 Text Interpretation: Normal sinus rhythm. Left axis deviation. Septal infarct, age undetermined. Abnormal ECG. No significant change since last tracing. Confirmed by Carmin Muskrat MD [phone]) on 05/13/2015  [id number] PM  Dg Chest 2 View  05/13/2015   CLINICAL DATA:  Shortness of breath intermittently getting worse for few weeks. Chest discomfort.  EXAM: CHEST  2 VIEW  COMPARISON:  Chest x-rays dated 02/05/2015 and 01/19/2015.  FINDINGS: Compared to the most recent study of 02/05/2015, there is a new dense opacity at the right lung base which could represent pneumonia or atelectasis with adjacent pleural effusion. There is improved aeration within the right upper lobe compatible with continued improvement of patient's previously described right upper lobe pneumonia.  There is mild interstitial prominence bilaterally suggesting edema. Linear atelectasis versus chronic scarring again noted within the left mid lung region. No pneumothorax. Cardiomediastinal silhouette appears stable in size and configuration. No acute osseous abnormality.  IMPRESSION: New dense opacity at the right lung base, centered within the right middle lobe, which could represent pneumonia or atelectasis. Aspiration also possible. There is also a right pleural effusion which is probably small.  Cardiomegaly with central pulmonary vascular congestion and mild bilateral interstitial edema suggesting some degree of volume overload/ CHF.  Continued improvement of the previously described right upper lobe pneumonia. I suspect that the previous right upper lobe pneumonia has completely resolved and that the linear opacities at the periphery of the right upper lobe are areas of chronic scarring (sequela of the previous pneumonia).   Electronically Signed   By: Franki Cabot  M.D.   On: 05/13/2015 13:12    Orion Crook, Med Student 05/13/2015, 4:25 PM MS4, [place] Intern pager: [phone], text pages welcome   RESIDENT ADDENDUM  I have separately seen and examined the patient. I have discussed the findings and exam with the medical student, helped develop the management plan that is described in the student's note, and I agree with the content.  Additionally I have outlined my exam and assessment/plan below:   S: 49 y.o. male with a history of HFpEF, HTN, tobacco use, poorly controlled IDDM, CKD, cocaine use with one month of progressively worsening dyspnea now present at rest associated with edema, 17lbs weight gain, and most recently nonexertional chest tightness. No documentation of COPD, no oxygen requirement at home. He denies fevers but endorses cough productive of unchanged volume and character of sputum. He snorts cocaine occasionally and drinks 80oz beer every other day. He was hospitalized in May for CAP, requiring BiPAP but not intubation. Had sleep study demonstrating OSA after this but has not received CPAP.   O: BP 174/88 mmHg  Pulse 67  Temp(Src) 98.3 F (36.8 C) (Oral)  Resp 18  Ht 6\' 1"  (1.854 m)  Wt 309 lb 11.9 oz (140.5 kg)  BMI 40.87 kg/m2  SpO2 93% Gen: Obese 49 y.o.male breathing heavily HEENT: PERRL, MMM, posterior oropharynx clear, large tongue and increased neck circumference. Poor dentition CV: Regular rate, distant S1S2, no murmur.  Pulm: Mildly labored saturating low 90's on 6L by [place]; diffuse bilateral crackles L > R. No wheezing, no change in I:E.  GI: Obese, +BS; soft, non-tender Ext: Significant pitting edema involving LE's and thighs dependently, + JVD, cap refill < 2 sec. Hands also edematous. Calves not tender without palpable cords, neg Homan's Neuro: Alert and oriented x4, no focal deficits in sensation or motor function. Fluent speech without aphasia. Vision severely diminished in R eye (sees  shadows) and less so in L eye.   A/P: Acute hypoxemic respiratory failure: Primarily related to volume overload favored over purely COPD (no actual Dx or history of exacerbation), pneumonia (  no fevers or significant cough or sputum), or PE.  - Continue oxygen by nasal cannula with close observation. Suspect significant underlying lung disease as he has required BiPAP for CAP earlier this year.  - Consider re-imaging with CT chest once diuresed more.  - Consider pulmonology consult 9/13  Acute HFpEF: last echo May 2016 with EF 55-60%, PA peak 44mmHg. G1DD seen on previous echo. Pro-BNP mildly elevated to 176.5. Cardiomegaly, vascular congestion, interstitial edema on CXR. 309 lbs on admission, unknown EDW.  - Diuresis with lasix 80mg  IV BID - Daily weights, strict I/O - Repeat ECHO - Continue statin - Due to concern for possible generalized edema will check U/A, U P:C, CMP  Acute coronary syndrome: Troponin elevation and ECG changes (nonspecific anteroseptal ST elevation) in patient with many risk factors for ischemic cardiomyopathy (HTN, DM, tobacco, cocaine, obesity). Obstructive CAD vs. cardiac strain causing mild and stable troponin elevation. Appreciate cardiology recommendations. - Admitting to SDU on heparin gtt, trend cardiac enzymes - Continue beta blocker, statin, ASA, NTG and morphine prn  OSA: Ordered CPAP prn. This will be required after discharge to prevent worsening cardiorespiratory function.   HTN: Concentric LVH noted on echo likely cause of diastolic dysfunction. Per notes, he was on norvasc and verapamil. Norvasc substituted with clonidine. I can find no reason for verapamil in the EMR and pt cannot explain.  - Continue beta blocker and clonidine to prevent rebound. Will hold metolazone with AKI. Hold verapamil as no history of SVT and possibly reduced EF.   AKI on CKD: Sees Dr. Justin Mend as outpatient. AKI thought to be due to cardiogenic renal hypoperfusion, hope for  improvement with diuresis.  - Monitor renal function daily, lasix as above.   ID-T2DM: Uncontrolled with last Hb A1c 8.9% on significant total daily insulin, speaking to resistance. Sequelae include retinopathy, nephropathy, neuropathy. History of severe hyperglycemia (> 750) without DKA.  - Lower home insulin and titrate upwards as needed.   Jezabella Schriever B. Bonner Puna, MD, PGY-3 05/13/2015 8:31 PM

## 2015-05-13 NOTE — ED Notes (Signed)
Pt transported to xray 

## 2015-05-13 NOTE — Consult Note (Signed)
ANTICOAGULATION CONSULT NOTE - Initial Consult  Pharmacy Consult for heparin Indication: chest pain/ACS  No Known Allergies  Patient Measurements: Height: 6\' 1"  (185.4 cm) Weight: (!) 307 lb 6 oz (139.424 kg) IBW/kg (Calculated) : 79.9 Heparin Dosing Weight: 111.5 kg  Vital Signs: Temp: 98.1 F (36.7 C) (09/12 1145) BP: 136/84 mmHg (09/12 1500) Pulse Rate: 61 (09/12 1500)  Labs:  Recent Labs  05/13/15 1142  HGB 15.8  HCT 50.6  PLT 149*  CREATININE 2.35*  TROPONINI 0.24*    Estimated Creatinine Clearance: 55.8 mL/min (by C-G formula based on Cr of 2.35).  Assessment: 49 yo male presenting with SOB, chest tightness, edema, and hypoxia with sats 88%. Symptoms have been worsening x 1 month  PMH: Hx of resp failure due to CAP in 5/16  AC: no ac PTA. HDW = 111.5kg  CV: Possible ACS. Trop 0.24. ECHO pending  Renal: SCr 2.35 (AKI?)  Heme: H&H 15.8/50.6, Plt 149  Goal of Therapy:  Heparin level 0.3-0.7 units/ml Monitor platelets by anticoagulation protocol: Yes   Plan:  Heparin bolus 4000 units Heparin infusion at 1300 units/hr 2230 HL Daily HL, CBC Monitor for s/sx of bleeding  Levester Fresh, PharmD, BCPS Clinical Pharmacist Pager [phone] 05/13/2015 4:02 PM

## 2015-05-13 NOTE — Consult Note (Signed)
Jerry Cantrell is an 49 y.o. male.    Primary Cardiologist:NEW PCP: [facility] Dr. Lavone Neri   Chief Complaint: SOB and edema with hypoxia sp02 at 88%, + chest tightness  HPI: 49 year old with hx of acute resp. Failure in 12/2014 with CAP.  Also sepsis, acute encephalopathy and acute renal failure.  He has HTN, OSA, DM-2, HTN and chronic diastolic HF.  + tobacco, hyperlipidemia.   No hx of CAD, no MI.   He now presents with SOB having taking shallow breaths, has hx of "walking pneumonia"  He is prone to pneumonia.  He has been increasing edema, worse over last month.  SOB increased over last month.   Chest tightness began 1 week or so ago, chest pain difficult to decide./  He has chronic back pain and neuropathy.  He has chest pressure and chest pain, sharp, but worse over last 2 days.  He has trouble walking due to numbness in legs.  When he left hospital he was walking OK.   He bakes his food, and steams veggies, occ fried foods.   He tries to exercise but cannot find a place to go.    He increased his lasix to twice a day. He has been taking his meds.  He is legally blind but does his meds weekly.    + productive cough, unsure what he coughs up- doesn't look at it plus vision problems. No fever or chills. Saw PCP 05/02/15 with plans for cardiology consult.   .   Troponin elevated 0.24  LAST echo 01/18/15: Study Conclusions - Left ventricle: Abnormal septal motion The cavity size was mildly dilated. There was mild concentric hypertrophy. Systolic function was normal. The estimated ejection fraction was in the range of 55% to 60%. Wall motion was normal; there were no regional wall motion abnormalities. - Left atrium: The atrium was mildly dilated. - Atrial septum: There was increased thickness of the septum, consistent with lipomatous hypertrophy. No defect or patent foramen ovale was identified. - Pulmonary arteries: PA peak pressure: 41 mm Hg (S).   Past Medical  History  Diagnosis Date  . Hypertension   . Hyperlipidemia   . Meralgia paraesthetica 05/03/2012  . Lumbar spondylosis 05/03/2012  . Lumbar spinal stenosis 05/03/2012    Mild with only right L4 nerve root encroachment, no neurogenic claudication   . Urinary incontinence 05/12/2012    Since the start of Sept. 2013   . Hemorrhoids 05/12/2012  . UTI (lower urinary tract infection)   . Meralgia paraesthetica 05/03/2012    On Lyrica which does improve pain.   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 Substance abuse   . Hyperlipidemia   . Pneumonia   . Diabetes mellitus   . 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 Acute diastolic heart failure   . Retinopathy   . CHF (congestive heart failure)   . Obesity     Past Surgical History  Procedure Laterality Date  . Tonsillectomy    . Abdominal surgery      Abscess I&D 2/2 infected hair  . Colonoscopy w/ polypectomy      pt to bring records    Family History  Problem Relation Age of Onset  . Prostate cancer Father   . Alcohol abuse Father   . Emphysema Father     smoked   Social History:  reports that he has been smoking Cigarettes.  He has a 12 pack-year smoking history. He has  never used smokeless tobacco. He reports that he drinks about 3.6 oz of alcohol per week. He reports that he uses illicit drugs (Marijuana). smoking 1/2 ppd, cocaine a night or so ago  Allergies: No Known Allergies  OUTPATIENT MEDICATIONS: No current facility-administered medications on file prior to encounter.   Current Outpatient Prescriptions on File Prior to Encounter  Medication Sig Dispense Refill  . albuterol (PROVENTIL HFA;VENTOLIN HFA) 108 (90 BASE) MCG/ACT inhaler Inhale 2 puffs into the lungs every 4 (four) hours as needed for wheezing or shortness of breath. 1 Inhaler 6  . amitriptyline (ELAVIL) 50 MG tablet Take 1 tablet (50 mg total) by mouth at bedtime. 30 tablet 1  . aspirin 325 MG tablet Take 1 tablet (325 mg total) by mouth daily. 30  tablet 0  . carvedilol (COREG) 25 MG tablet Take 1 tablet (25 mg total) by mouth 2 (two) times daily with a meal. 28 tablet 0  . cloNIDine (CATAPRES) 0.2 MG tablet Take 1 tablet (0.2 mg total) by mouth 3 (three) times daily. 90 tablet 0  . fluticasone (FLONASE) 50 MCG/ACT nasal spray Place 1-2 sprays into both nostrils 2 (two) times daily. 9.9 g 0  . furosemide (LASIX) 80 MG tablet Take 1 tablet (80 mg total) by mouth daily as needed for edema. 10 tablet 0  . gabapentin (NEURONTIN) 300 MG capsule Take 1 capsule (300 mg total) by mouth 3 (three) times daily. 200 mg in the morning and 100 mg in the evening. 90 capsule 0  . gemfibrozil (LOPID) 600 MG tablet Take 1 tablet (600 mg total) by mouth 2 (two) times daily before a meal. 60 tablet 1  . hydrALAZINE (APRESOLINE) 100 MG tablet Take 1 tablet (100 mg total) by mouth 3 (three) times daily. 90 tablet 3  . insulin aspart (NOVOLOG) 100 UNIT/ML injection Inject 10-30 Units into the skin 3 (three) times daily before meals. Per sliding scale 10 mL 0  . insulin glargine (LANTUS) 100 UNIT/ML injection Inject 0.4 mLs (40 Units total) into the skin 2 (two) times daily. (Patient taking differently: Inject 35-47 Units into the skin 2 (two) times daily. Take 47 units in the morning, and 35 units at bedtime) 10 mL 0  . nicotine (NICODERM CQ - DOSED IN MG/24 HOURS) 21 mg/24hr patch Place 1 patch (21 mg total) onto the skin daily. 14 patch 0  . verapamil (CALAN-SR) 120 MG CR tablet Take 1 tablet (120 mg total) by mouth at bedtime. 8 tablet 0  . bag balm OINT ointment Apply 1 application topically as needed for dry skin.    Marland Kitchen glucose blood test strip Use as instructed 100 each 12  . levofloxacin (LEVAQUIN) 750 MG tablet Take 1 tablet (750 mg total) by mouth daily. (Patient not taking: Reported on 05/13/2015) 5 tablet 0  . nicotine (NICODERM CQ) 14 mg/24hr patch Place 1 patch (14 mg total) onto the skin daily. (Patient not taking: Reported on 05/13/2015) 14 patch 0  .  nicotine (NICODERM CQ) 7 mg/24hr patch Place 1 patch (7 mg total) onto the skin daily. Start when 14 mg patches have finished (Patient not taking: Reported on 05/13/2015) 14 patch 0  . omega-3 acid ethyl esters (LOVAZA) 1 G capsule Take 2 g by mouth 2 (two) times daily.     . Polyvinyl Alcohol-Povidone (REFRESH OP) Place 2 drops into both eyes 3 (three) times daily as needed (dry eyes).      Has been increasing his lasix to twice a day  at home.   Has been using his nicoderm patch    Results for orders placed or performed during the hospital encounter of 05/13/15 (from the past 48 hour(s))  Basic metabolic panel     Status: Abnormal   Collection Time: 05/13/15 11:42 AM  Result Value Ref Range   Sodium 143 135 - 145 mmol/L   Potassium 3.9 3.5 - 5.1 mmol/L   Chloride 105 101 - 111 mmol/L   CO2 30 22 - 32 mmol/L   Glucose, Bld 238 (H) 65 - 99 mg/dL   BUN 29 (H) 6 - 20 mg/dL   Creatinine, Ser 2.35 (H) 0.61 - 1.24 mg/dL   Calcium 8.6 (L) 8.9 - 10.3 mg/dL   GFR calc non Af Amer 31 (L) >60 mL/min   GFR calc Af Amer 36 (L) >60 mL/min    Comment: (NOTE) The eGFR has been calculated using the CKD EPI equation. This calculation has not been validated in all clinical situations. eGFR's persistently <60 mL/min signify possible Chronic Kidney Disease.    Anion gap 8 5 - 15  CBC     Status: Abnormal   Collection Time: 05/13/15 11:42 AM  Result Value Ref Range   WBC 6.9 4.0 - 10.5 K/uL   RBC 5.21 4.22 - 5.81 MIL/uL   Hemoglobin 15.8 13.0 - 17.0 g/dL   HCT 50.6 39.0 - 52.0 %   MCV 97.1 78.0 - 100.0 fL   MCH 30.3 26.0 - 34.0 pg   MCHC 31.2 30.0 - 36.0 g/dL   RDW 16.3 (H) 11.5 - 15.5 %   Platelets 149 (L) 150 - 400 K/uL  Brain natriuretic peptide     Status: Abnormal   Collection Time: 05/13/15 11:42 AM  Result Value Ref Range   B Natriuretic Peptide 176.5 (H) 0.0 - 100.0 pg/mL  Troponin I     Status: Abnormal   Collection Time: 05/13/15 11:42 AM  Result Value Ref Range   Troponin I 0.24  (H) <0.031 ng/mL    Comment:        PERSISTENTLY INCREASED TROPONIN VALUES IN THE RANGE OF 0.04-0.49 ng/mL CAN BE SEEN IN:       -UNSTABLE ANGINA       -CONGESTIVE HEART FAILURE       -MYOCARDITIS       -CHEST TRAUMA       -ARRYHTHMIAS       -LATE PRESENTING MYOCARDIAL INFARCTION       -COPD   CLINICAL FOLLOW-UP RECOMMENDED.   I-stat troponin, ED     Status: Abnormal   Collection Time: 05/13/15 11:50 AM  Result Value Ref Range   Troponin i, poc 0.24 (HH) 0.00 - 0.08 ng/mL   Comment NOTIFIED PHYSICIAN    Comment 3            Comment: Due to the release kinetics of cTnI, a negative result within the first hours of the onset of symptoms does not rule out myocardial infarction with certainty. If myocardial infarction is still suspected, repeat the test at appropriate intervals.    Dg Chest 2 View  05/13/2015   CLINICAL DATA:  Shortness of breath intermittently getting worse for few weeks. Chest discomfort.  EXAM: CHEST  2 VIEW  COMPARISON:  Chest x-rays dated 02/05/2015 and 01/19/2015.  FINDINGS: Compared to the most recent study of 02/05/2015, there is a new dense opacity at the right lung base which could represent pneumonia or atelectasis with adjacent pleural effusion. There is improved aeration within  the right upper lobe compatible with continued improvement of patient's previously described right upper lobe pneumonia.  There is mild interstitial prominence bilaterally suggesting edema. Linear atelectasis versus chronic scarring again noted within the left mid lung region. No pneumothorax. Cardiomediastinal silhouette appears stable in size and configuration. No acute osseous abnormality.  IMPRESSION: New dense opacity at the right lung base, centered within the right middle lobe, which could represent pneumonia or atelectasis. Aspiration also possible. There is also a right pleural effusion which is probably small.  Cardiomegaly with central pulmonary vascular congestion and mild  bilateral interstitial edema suggesting some degree of volume overload/ CHF.  Continued improvement of the previously described right upper lobe pneumonia. I suspect that the previous right upper lobe pneumonia has completely resolved and that the linear opacities at the periphery of the right upper lobe are areas of chronic scarring (sequela of the previous pneumonia).   Electronically Signed   By: Franki Cabot M.D.   On: 05/13/2015 13:12    ROS: General:no colds or fevers, no weight changes, + occ cocaine Skin:no rashes or ulcers HEENT:no blurred vision, no congestion CV:see HPI PUL:see HPI GI:no diarrhea constipation or melena, no indigestion GU:no hematuria, no dysuria MS:no joint pain, no claudication Neuro:no syncope, no lightheadedness Endo:+ diabetes- 135 this AM though at times 200s, no thyroid disease   Blood pressure 139/76, pulse 61, temperature 98.1 F (36.7 C), resp. rate 18, weight 307 lb 6 oz (139.424 kg), SpO2 95 %. PE: per MD General:Pleasant affect, NAD Skin:Warm and dry, brisk capillary refill HEENT:normocephalic, sclera clear, mucus membranes moist Neck:supple, Neck is full, no bruits  Heart:S1S2 RRR without murmur, gallup, rub or click Lungs:Decreased BS at R base  Rales at L base   HWT:UUEK, non tender, + BS, do not palpate liver spleen or masses Ext: 1+lower ext edema, 2+ pedal pulses, 2+ radial pulses Neuro:alert and oriented, MAE, follows commands, + facial symmetry  Decreased sensation legs bilaterally      EKG SR 68 bpm  Anteroseptal MI  Nonspecific ST T wave changes    Assessment/Plan Principal Problem:   Chest pain at rest Active Problems:   Hyperlipemia   Hypertension   Obstructive sleep apnea   Decreased peripheral vision of left eye  Will add IV lasix, get limited echo to eval LV function.  Will follow along. Follow troponins and lipid.  Dr. Harrington Challenger has seen and examined. Will place on heparin until cardiac issues evaluated.  [place] Practitioner Certified [place] Pager [phone] or after 5pm or weekends call [phone] 05/13/2015, 2:31 PM  Patient seen and examined  I have amended note above by L INgold   49 yo presents with chest pressure / chest tightness. Pt admits to cocaine use  Exam with evid of fluid overload (echo in may 2016 LVEF was normal).  Trop minimially elevated  CXR has not normalized  ? Infiltrate  Pulm edema  Recomm as noted above  Heparin until trop tracked.  Repeat echo to eval LVEF and wall motion  IV lasix with strict I/O   Counselled on tobacco and cocaine. Contact pulm fro recomm given CXR findings.     Will follow   2.  HTN  Follow    3  Sleep apnea  Never got CPAP delivered.    4.  Perip neuropathy   Dorris Carnes

## 2015-05-13 NOTE — Progress Notes (Signed)
Came to unit to see if patient wanted to use NIV, RN stopped me and told me that he already ask pt if wanted to use NIV, and pt refuse. RN aware

## 2015-05-13 NOTE — ED Notes (Signed)
Pt here for SOB, and edema. sts CHF. Pt sats 88 on RA. sts mid 90s on 6l.

## 2015-05-13 NOTE — ED Notes (Signed)
Pt reports SOB and diffuse chest tightness today. Reports difficulty breathing, specifically on exertion. Lung sounds clear and equal bilaterally. Pt is alert and oriented x4.

## 2015-05-13 NOTE — ED Provider Notes (Signed)
CSN: [id number]     Arrival date & time 05/13/15  1132 History   First MD Initiated Contact with Patient 05/13/15 1158     Chief Complaint  Patient presents with  . Shortness of Breath    HPI  Patient presents with concern of dyspnea, edema, fatigue. Patient states that symptoms began several days ago, have been progressive, and today he developed left-sided"funny"sensation in his chest. No new syncope, vomiting, nausea, anorexia, fever, chills. She states that he is inconsistently taking medication, including no aspirin in one month. Since onset of symptoms, no clear alleviating or exacerbating factors. Patient requires oxygen at home.   Past Medical History  Diagnosis Date  . Hypertension   . Hyperlipidemia   . Meralgia paraesthetica 05/03/2012  . Lumbar spondylosis 05/03/2012  . Lumbar spinal stenosis 05/03/2012    Mild with only right L4 nerve root encroachment, no neurogenic claudication   . Urinary incontinence 05/12/2012    Since the start of Sept. 2013   . Hemorrhoids 05/12/2012  . UTI (lower urinary tract infection)   . Meralgia paraesthetica 05/03/2012    On Lyrica which does improve pain.   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 Substance abuse   . Hyperlipidemia   . Pneumonia   . Diabetes mellitus   . 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 Acute diastolic heart failure   . Retinopathy   . CHF (congestive heart failure)   . Obesity    Past Surgical History  Procedure Laterality Date  . Tonsillectomy    . Abdominal surgery      Abscess I&D 2/2 infected hair  . Colonoscopy w/ polypectomy      pt to bring records   Family History  Problem Relation Age of Onset  . Prostate cancer Father   . Alcohol abuse Father   . Emphysema Father     smoked   Social History  Substance Use Topics  . Smoking status: Current Every Day Smoker -- 0.40 packs/day for 30 years    Types: Cigarettes  . Smokeless tobacco: Never Used     Comment: "Trying - hard to stop"  .  Alcohol Use: 3.6 oz/week    6 Cans of beer per week     Comment: beer    Review of Systems  Constitutional:       Per HPI, otherwise negative  HENT:       Per HPI, otherwise negative  Respiratory:       Per HPI, otherwise negative  Cardiovascular:       Per HPI, otherwise negative  Gastrointestinal: Negative for vomiting.  Endocrine:       Negative aside from HPI  Genitourinary:       Neg aside from HPI   Musculoskeletal:       Per HPI, otherwise negative  Skin: Negative.   Neurological: Negative for syncope.      Allergies  Review of patient's allergies indicates no known allergies.  Home Medications   Prior to Admission medications   Medication Sig Start Date End Date Taking? Authorizing Provider  albuterol (PROVENTIL HFA;VENTOLIN HFA) 108 (90 BASE) MCG/ACT inhaler Inhale 2 puffs into the lungs every 4 (four) hours as needed for wheezing or shortness of breath. 02/05/15   Tiffany Carlota Raspberry, PA-C  amitriptyline (ELAVIL) 50 MG tablet Take 1 tablet (50 mg total) by mouth at bedtime. 02/05/15   Tiffany Carlota Raspberry, PA-C  aspirin 325 MG tablet Take 1 tablet (325 mg total) by mouth daily. 02/05/15  Delos Haring, PA-C  bag balm OINT ointment Apply 1 application topically as needed for dry skin.    Historical Provider, MD  carvedilol (COREG) 25 MG tablet Take 1 tablet (25 mg total) by mouth 2 (two) times daily with a meal. 02/05/15   Delos Haring, PA-C  cloNIDine (CATAPRES) 0.2 MG tablet Take 1 tablet (0.2 mg total) by mouth 3 (three) times daily. 02/05/15   Tiffany Carlota Raspberry, PA-C  fluticasone (FLONASE) 50 MCG/ACT nasal spray Place 1-2 sprays into both nostrils 2 (two) times daily. 02/05/15   Tiffany Carlota Raspberry, PA-C  furosemide (LASIX) 80 MG tablet Take 1 tablet (80 mg total) by mouth daily as needed for edema. 02/05/15   Tiffany Carlota Raspberry, PA-C  gabapentin (NEURONTIN) 300 MG capsule Take 1 capsule (300 mg total) by mouth 3 (three) times daily. 200 mg in the morning and 100 mg in the evening. 02/05/15    Tiffany Carlota Raspberry, PA-C  gemfibrozil (LOPID) 600 MG tablet Take 1 tablet (600 mg total) by mouth 2 (two) times daily before a meal. 02/05/15   Delos Haring, PA-C  glucose blood test strip Use as instructed 02/05/15   Delos Haring, PA-C  hydrALAZINE (APRESOLINE) 100 MG tablet Take 1 tablet (100 mg total) by mouth 3 (three) times daily. 02/05/15   Tiffany Carlota Raspberry, PA-C  insulin aspart (NOVOLOG) 100 UNIT/ML injection Inject 10-30 Units into the skin 3 (three) times daily before meals. Per sliding scale 02/05/15   Delos Haring, PA-C  insulin glargine (LANTUS) 100 UNIT/ML injection Inject 0.4 mLs (40 Units total) into the skin 2 (two) times daily. 02/05/15   Tiffany Carlota Raspberry, PA-C  levofloxacin (LEVAQUIN) 750 MG tablet Take 1 tablet (750 mg total) by mouth daily. 02/05/15   Tiffany Carlota Raspberry, PA-C  nicotine (NICODERM CQ - DOSED IN MG/24 HOURS) 21 mg/24hr patch Place 1 patch (21 mg total) onto the skin daily. 01/22/15   Debbe Odea, MD  nicotine (NICODERM CQ) 14 mg/24hr patch Place 1 patch (14 mg total) onto the skin daily. 02/04/15   Debbe Odea, MD  nicotine (NICODERM CQ) 7 mg/24hr patch Place 1 patch (7 mg total) onto the skin daily. Start when 14 mg patches have finished 02/17/15   Debbe Odea, MD  omega-3 acid ethyl esters (LOVAZA) 1 G capsule Take 2 g by mouth 2 (two) times daily.     Historical Provider, MD  Polyvinyl Alcohol-Povidone (REFRESH OP) Place 2 drops into both eyes 3 (three) times daily as needed (dry eyes).    Historical Provider, MD  verapamil (CALAN-SR) 120 MG CR tablet Take 1 tablet (120 mg total) by mouth at bedtime. 02/05/15   Tiffany Carlota Raspberry, PA-C   BP 140/72 mmHg  Pulse 68  Temp(Src) 98.1 F (36.7 C)  Resp 20  Wt 307 lb 6 oz (139.424 kg)  SpO2 87% Physical Exam  Constitutional: He is oriented to person, place, and time. He appears well-developed. No distress.  HENT:  Head: Normocephalic and atraumatic.  Eyes: Conjunctivae and EOM are normal.  Cardiovascular: Normal rate and regular rhythm.    Pulmonary/Chest: Effort normal. No accessory muscle usage or stridor. No respiratory distress. He has decreased breath sounds. He has no wheezes. He has no rhonchi.  Abdominal: He exhibits no distension.  Musculoskeletal: He exhibits no edema.  Neurological: He is alert and oriented to person, place, and time.  Skin: Skin is warm and dry.  Psychiatric: He has a normal mood and affect.  Nursing note and vitals reviewed.   ED Course  Procedures (including critical  care time) Labs Review Labs Reviewed  BASIC METABOLIC PANEL - Abnormal; Notable for the following:    Glucose, Bld 238 (*)    BUN 29 (*)    Creatinine, Ser 2.35 (*)    Calcium 8.6 (*)    GFR calc non Af Amer 31 (*)    GFR calc Af Amer 36 (*)    All other components within normal limits  CBC - Abnormal; Notable for the following:    RDW 16.3 (*)    Platelets 149 (*)    All other components within normal limits  BRAIN NATRIURETIC PEPTIDE - Abnormal; Notable for the following:    B Natriuretic Peptide 176.5 (*)    All other components within normal limits  TROPONIN I - Abnormal; Notable for the following:    Troponin I 0.24 (*)    All other components within normal limits  I-STAT TROPOININ, ED - Abnormal; Notable for the following:    Troponin i, poc 0.24 (*)    All other components within normal limits  TROPONIN I  TROPONIN I  TROPONIN I    Imaging Review Dg Chest 2 View  05/13/2015   CLINICAL DATA:  Shortness of breath intermittently getting worse for few weeks. Chest discomfort.  EXAM: CHEST  2 VIEW  COMPARISON:  Chest x-rays dated 02/05/2015 and 01/19/2015.  FINDINGS: Compared to the most recent study of 02/05/2015, there is a new dense opacity at the right lung base which could represent pneumonia or atelectasis with adjacent pleural effusion. There is improved aeration within the right upper lobe compatible with continued improvement of patient's previously described right upper lobe pneumonia.  There is mild  interstitial prominence bilaterally suggesting edema. Linear atelectasis versus chronic scarring again noted within the left mid lung region. No pneumothorax. Cardiomediastinal silhouette appears stable in size and configuration. No acute osseous abnormality.  IMPRESSION: New dense opacity at the right lung base, centered within the right middle lobe, which could represent pneumonia or atelectasis. Aspiration also possible. There is also a right pleural effusion which is probably small.  Cardiomegaly with central pulmonary vascular congestion and mild bilateral interstitial edema suggesting some degree of volume overload/ CHF.  Continued improvement of the previously described right upper lobe pneumonia. I suspect that the previous right upper lobe pneumonia has completely resolved and that the linear opacities at the periphery of the right upper lobe are areas of chronic scarring (sequela of the previous pneumonia).   Electronically Signed   By: Franki Cabot M.D.   On: 05/13/2015 13:12   I have personally reviewed and evaluated these images and lab results as part of my medical decision-making.   EKG Interpretation   Date/Time:  Monday May 13 2015 11:44:07 EDT Ventricular Rate:  68 PR Interval:  184 QRS Duration: 122 QT Interval:  438 QTC Calculation: 465 R Axis:   -48 Text Interpretation:  Normal sinus rhythm Left axis deviation Septal  infarct , age undetermined Abnormal ECG Sinus rhythm Left axis deviation  Artifact No significant change since last tracing Abnormal ekg Confirmed  by Carmin Muskrat  MD [phone]) on 05/13/2015 12:06:04 PM     Last echo in 4/15 revealed in EF of 50-55% and grade 1 diastolic dysfunction  Initial troponin 0.24, positive  Subsequent formal troponin also positive. I discussed patient's case with cardiology, after my initial evaluation. Patient had no ongoing chest pain, but with elevated troponin, history of CHF, risk factors, heparin will be  started.  Given the patient's respiratory function,  chronic kidney disease, cardiology will follow as a consulting team.  With consideration for possible pneumonia, though the patient has no fever, no leukocytosis, empiric antibiotics also started.  3:41 PM Patient in similar condition.  MDM  Patient presents essentially with dyspnea, fatigue. Patient has multiple medical issues, and a broad differential was considered initially, including CHF exacerbation, COPD exacerbation, infectious etiology. Patient's labs most notable for demonstration of continued renal dysfunction, as well as elevated troponin, BNP Presentation likely multifactorial, given the patient's acknowledgment of not taking medication regularly.  Given the patient's elevated troponin, risk factors, he was started on heparin drip, antibiotics for possible pneumonia, and increased Lasix as well. Patient admission for further evaluation, management.   CRITICAL CARE Performed by: Carmin Muskrat Total critical care time: 35 Critical care time was exclusive of separately billable procedures and treating other patients. Critical care was necessary to treat or prevent imminent or life-threatening deterioration. Critical care was time spent personally by me on the following activities: development of treatment plan with patient and/or surrogate as well as nursing, discussions with consultants, evaluation of patient's response to treatment, examination of patient, obtaining history from patient or surrogate, ordering and performing treatments and interventions, ordering and review of laboratory studies, ordering and review of radiographic studies, pulse oximetry and re-evaluation of patient's condition.    Carmin Muskrat, MD 05/13/15 [phone]

## 2015-05-14 ENCOUNTER — Other Ambulatory Visit (HOSPITAL_COMMUNITY): Payer: Self-pay

## 2015-05-14 DIAGNOSIS — E785 Hyperlipidemia, unspecified: Secondary | ICD-10-CM

## 2015-05-14 DIAGNOSIS — I248 Other forms of acute ischemic heart disease: Secondary | ICD-10-CM

## 2015-05-14 DIAGNOSIS — R079 Chest pain, unspecified: Secondary | ICD-10-CM

## 2015-05-14 DIAGNOSIS — R7989 Other specified abnormal findings of blood chemistry: Secondary | ICD-10-CM

## 2015-05-14 DIAGNOSIS — I5033 Acute on chronic diastolic (congestive) heart failure: Principal | ICD-10-CM

## 2015-05-14 DIAGNOSIS — G4733 Obstructive sleep apnea (adult) (pediatric): Secondary | ICD-10-CM

## 2015-05-14 DIAGNOSIS — R778 Other specified abnormalities of plasma proteins: Secondary | ICD-10-CM | POA: Insufficient documentation

## 2015-05-14 DIAGNOSIS — I10 Essential (primary) hypertension: Secondary | ICD-10-CM

## 2015-05-14 DIAGNOSIS — R0600 Dyspnea, unspecified: Secondary | ICD-10-CM

## 2015-05-14 DIAGNOSIS — I509 Heart failure, unspecified: Secondary | ICD-10-CM

## 2015-05-14 LAB — URINE MICROSCOPIC-ADD ON

## 2015-05-14 LAB — URINALYSIS, ROUTINE W REFLEX MICROSCOPIC
BILIRUBIN URINE: NEGATIVE
Glucose, UA: >1000 mg/dL — AB
KETONES UR: NEGATIVE mg/dL
Leukocytes, UA: NEGATIVE
Nitrite: NEGATIVE
Specific Gravity, Urine: 1.018 (ref 1.005–1.030)
UROBILINOGEN UA: 0.2 mg/dL (ref 0.0–1.0)
pH: 5 (ref 5.0–8.0)

## 2015-05-14 LAB — GLUCOSE, CAPILLARY
GLUCOSE-CAPILLARY: 369 mg/dL — AB (ref 65–99)
GLUCOSE-CAPILLARY: 439 mg/dL — AB (ref 65–99)
GLUCOSE-CAPILLARY: 511 mg/dL — AB (ref 65–99)
GLUCOSE-CAPILLARY: 452 mg/dL — AB (ref 65–99)
GLUCOSE-CAPILLARY: 361 mg/dL — AB (ref 65–99)
Glucose-Capillary: 432 mg/dL — ABNORMAL HIGH (ref 65–99)

## 2015-05-14 LAB — CBC
HCT: 52.5 % — ABNORMAL HIGH (ref 39.0–52.0)
HEMOGLOBIN: 16 g/dL (ref 13.0–17.0)
MCH: 29.8 pg (ref 26.0–34.0)
MCHC: 30.5 g/dL (ref 30.0–36.0)
MCV: 97.8 fL (ref 78.0–100.0)
Platelets: 159 10*3/uL (ref 150–400)
RBC: 5.37 MIL/uL (ref 4.22–5.81)
RDW: 15.9 % — ABNORMAL HIGH (ref 11.5–15.5)
WBC: 8.7 10*3/uL (ref 4.0–10.5)

## 2015-05-14 LAB — COMPREHENSIVE METABOLIC PANEL
ALK PHOS: 92 U/L (ref 38–126)
ALT: 15 U/L — ABNORMAL LOW (ref 17–63)
ANION GAP: 6 (ref 5–15)
AST: 14 U/L — ABNORMAL LOW (ref 15–41)
Albumin: 2.4 g/dL — ABNORMAL LOW (ref 3.5–5.0)
BILIRUBIN TOTAL: 0.4 mg/dL (ref 0.3–1.2)
BUN: 32 mg/dL — ABNORMAL HIGH (ref 6–20)
CALCIUM: 8.4 mg/dL — AB (ref 8.9–10.3)
CO2: 32 mmol/L (ref 22–32)
CREATININE: 2.57 mg/dL — AB (ref 0.61–1.24)
Chloride: 99 mmol/L — ABNORMAL LOW (ref 101–111)
GFR calc non Af Amer: 28 mL/min — ABNORMAL LOW (ref >60)
GFR, EST AFRICAN AMERICAN: 32 mL/min — AB (ref >60)
Glucose, Bld: 420 mg/dL — ABNORMAL HIGH (ref 65–99)
Potassium: 4.8 mmol/L (ref 3.5–5.1)
Sodium: 137 mmol/L (ref 135–145)
TOTAL PROTEIN: 5.8 g/dL — AB (ref 6.5–8.1)

## 2015-05-14 LAB — PROTEIN / CREATININE RATIO, URINE
CREATININE, URINE: 72.33 mg/dL
Protein Creatinine Ratio: 6.07 mg/mg{Cre} — ABNORMAL HIGH (ref 0.00–0.15)
Total Protein, Urine: 439 mg/dL

## 2015-05-14 LAB — HEPARIN LEVEL (UNFRACTIONATED)
HEPARIN UNFRACTIONATED: 0.16 [IU]/mL — AB (ref 0.30–0.70)
Heparin Unfractionated: 0.13 IU/mL — ABNORMAL LOW (ref 0.30–0.70)

## 2015-05-14 LAB — TROPONIN I: Troponin I: 0.14 ng/mL — ABNORMAL HIGH (ref <0.031)

## 2015-05-14 MED ORDER — INSULIN GLARGINE 100 UNIT/ML ~~LOC~~ SOLN
50.0000 [IU] | Freq: Every day | SUBCUTANEOUS | Status: DC
  Administered 2015-05-15 – 2015-05-18 (×4): 50 [IU] via SUBCUTANEOUS
  Filled 2015-05-14 (×4): qty 0.5

## 2015-05-14 MED ORDER — ENOXAPARIN SODIUM 80 MG/0.8ML ~~LOC~~ SOLN
70.0000 mg | SUBCUTANEOUS | Status: DC
  Administered 2015-05-14 – 2015-05-17 (×4): 70 mg via SUBCUTANEOUS
  Filled 2015-05-14 (×5): qty 0.8

## 2015-05-14 MED ORDER — VITAMIN B-1 100 MG PO TABS
100.0000 mg | ORAL_TABLET | Freq: Every day | ORAL | Status: DC
  Administered 2015-05-14 – 2015-05-18 (×5): 100 mg via ORAL
  Filled 2015-05-14 (×5): qty 1

## 2015-05-14 MED ORDER — FOLIC ACID 1 MG PO TABS
1.0000 mg | ORAL_TABLET | Freq: Every day | ORAL | Status: DC
  Administered 2015-05-14 – 2015-05-18 (×5): 1 mg via ORAL
  Filled 2015-05-14 (×5): qty 1

## 2015-05-14 MED ORDER — THIAMINE HCL 100 MG/ML IJ SOLN
100.0000 mg | Freq: Every day | INTRAMUSCULAR | Status: DC
Start: 2015-05-14 — End: 2015-05-15
  Filled 2015-05-14 (×2): qty 1

## 2015-05-14 MED ORDER — OMEGA-3-ACID ETHYL ESTERS 1 G PO CAPS
2.0000 g | ORAL_CAPSULE | Freq: Two times a day (BID) | ORAL | Status: DC
  Administered 2015-05-14 – 2015-05-18 (×9): 2 g via ORAL
  Filled 2015-05-14 (×11): qty 2

## 2015-05-14 MED ORDER — ADULT MULTIVITAMIN W/MINERALS CH
1.0000 | ORAL_TABLET | Freq: Every day | ORAL | Status: DC
  Administered 2015-05-14 – 2015-05-18 (×5): 1 via ORAL
  Filled 2015-05-14 (×5): qty 1

## 2015-05-14 MED ORDER — HYDRALAZINE HCL 20 MG/ML IJ SOLN
5.0000 mg | INTRAMUSCULAR | Status: DC | PRN
  Administered 2015-05-15 – 2015-05-16 (×2): 5 mg via INTRAVENOUS
  Filled 2015-05-14 (×2): qty 1

## 2015-05-14 MED ORDER — LORAZEPAM 2 MG/ML IJ SOLN: 1.0000 mg | Freq: Four times a day (QID) | INTRAMUSCULAR | Status: AC | PRN

## 2015-05-14 MED ORDER — INSULIN ASPART 100 UNIT/ML ~~LOC~~ SOLN
0.0000 [IU] | Freq: Three times a day (TID) | SUBCUTANEOUS | Status: DC
  Administered 2015-05-14 (×2): 20 [IU] via SUBCUTANEOUS
  Administered 2015-05-15: 3 [IU] via SUBCUTANEOUS
  Administered 2015-05-15 – 2015-05-16 (×2): 7 [IU] via SUBCUTANEOUS
  Administered 2015-05-16: 11 [IU] via SUBCUTANEOUS
  Administered 2015-05-17: 4 [IU] via SUBCUTANEOUS
  Administered 2015-05-17: 11 [IU] via SUBCUTANEOUS
  Administered 2015-05-17: 4 [IU] via SUBCUTANEOUS
  Administered 2015-05-18: 11 [IU] via SUBCUTANEOUS

## 2015-05-14 MED ORDER — LORAZEPAM 1 MG PO TABS: 1.0000 mg | ORAL_TABLET | Freq: Four times a day (QID) | ORAL | Status: AC | PRN

## 2015-05-14 MED ORDER — AMLODIPINE BESYLATE 5 MG PO TABS
5.0000 mg | ORAL_TABLET | Freq: Every day | ORAL | Status: DC
  Administered 2015-05-14 – 2015-05-15 (×2): 5 mg via ORAL
  Filled 2015-05-14 (×2): qty 1

## 2015-05-14 MED ORDER — INSULIN GLARGINE 100 UNIT/ML ~~LOC~~ SOLN
10.0000 [IU] | Freq: Once | SUBCUTANEOUS | Status: AC
  Administered 2015-05-14: 10 [IU] via SUBCUTANEOUS
  Filled 2015-05-14: qty 0.1

## 2015-05-14 MED ORDER — GEMFIBROZIL 600 MG PO TABS
600.0000 mg | ORAL_TABLET | Freq: Two times a day (BID) | ORAL | Status: DC
Start: 2015-05-14 — End: 2015-05-16
  Administered 2015-05-14 – 2015-05-16 (×4): 600 mg via ORAL
  Filled 2015-05-14 (×6): qty 1

## 2015-05-14 MED ORDER — HEPARIN BOLUS VIA INFUSION
2000.0000 [IU] | Freq: Once | INTRAVENOUS | Status: AC
  Administered 2015-05-14: 2000 [IU] via INTRAVENOUS
  Filled 2015-05-14: qty 2000

## 2015-05-14 NOTE — Progress Notes (Signed)
Inpatient Diabetes Program Recommendations  AACE/ADA: New Consensus Statement on Inpatient Glycemic Control (2015)  Target Ranges:  Prepandial:   less than 140 mg/dL      Peak postprandial:   less than 180 mg/dL (1-2 hours)      Critically ill patients:  140 - 180 mg/dL    Results for JOSHUS, ARAMBURO (MRN [id number]) as of 05/14/2015 13:14  Ref. Range 05/14/2015 03:58 05/14/2015 08:03 05/14/2015 12:11 05/14/2015 12:13  Glucose-Capillary Latest Ref Range: 65-99 mg/dL 369 (H) 432 (H) 511 (H) 439 (H)    Admit with: CHF  History: DM, CKD, HTN  Home DM Meds: Lantus 50 units bid (per MD H&P note in chart)       Novolog 10-30 units tid per SSI  Current DM Orders: Lantus 50 units daily            Novolog Resistant SSI (0-20 units) TID AC + HS    MD- Note Lantus increased to 50 units daily today.  Glucose levels quite elevated.  Patient ate 100% of breakfast today.  MD- Please consider the following in-hospital insulin adjustments:  1. Increase Lantus further to 35 units bid (this would be approximately 75% of his supposed home dose of Lantus)  2. Start Novolog Meal Coverage- Novolog 4 units tid with meals [Use Glycemic Control Order set to order]      Will follow Wyn Quaker RN, MSN, CDE Diabetes Coordinator Inpatient Glycemic Control Team Team Pager: [phone] (8a-5p)

## 2015-05-14 NOTE — Progress Notes (Signed)
ANTICOAGULATION CONSULT NOTE - Follow Up Consult  Pharmacy Consult for heparin Indication: chest pain/ACS   Patient Measurements: Height: 6\' 1"  (185.4 cm) Weight: (!) 307 lb 6 oz (139.424 kg) IBW/kg (Calculated) : 79.9 Heparin Dosing Weight: 111.5 kg  Labs:  Recent Labs  05/13/15 1142 05/13/15 1610 05/13/15 2203 05/14/15 0246  HGB 15.8  --   --  16.0  HCT 50.6  --   --  52.5*  PLT 149*  --   --  159  HEPARINUNFRC  --   --  <0.10* 0.13*  CREATININE 2.35*  --   --   --   TROPONINI 0.24* 0.23* 0.17*  --     Assessment: 49yo male on heparin for CP. Heparin level remains subtherapeutic (0.13) on 1800 units/hr. Heparin level drawn ~4.5 hrs post gtt change. CBC stable. No issues with line or bleeding reported per RN.  Goal of Therapy:  Heparin level 0.3-0.7 units/ml   Plan:  Will rebolus with heparin 2000 units and increase gtt to 2100 units/hr F/u heparin level in 6 hours  Sherlon Handing, PharmD, BCPS Clinical pharmacist, pager [phone] 05/14/2015,4:04 AM

## 2015-05-14 NOTE — Progress Notes (Signed)
Patient Name: Jerry Cantrell Date of Encounter: 05/14/2015  Principal Problem:   Chest pain at rest Active Problems:   Hyperlipemia   Hypertension   Obstructive sleep apnea   Decreased peripheral vision of left eye   Respiratory distress   CHF exacerbation   Primary Cardiologist: Dr Harrington Challenger  Patient Profile: 49 yo male w/ hx CAP & resp failure, sepsis, ARF, acute encephalopathy 2015. Hx HTN, OSA, DM2, D-CHF, HL, tob use. Admitted 09/12 w/ sharp chest pain, hx cocaine use, volume overload, periph neuropathy.  SUBJECTIVE: Breathing better, not at baseline. Does cocaine and drinks every so often with a male friend.  OBJECTIVE Filed Vitals:   05/14/15 0702 05/14/15 0800 05/14/15 0918 05/14/15 0950  BP:   176/92 176/92  Pulse:   73   Temp: 98.2 F (36.8 C)     TempSrc: Oral     Resp:   19   Height:      Weight:      SpO2:  87% 97%     Intake/Output Summary (Last 24 hours) at 05/14/15 1114 Last data filed at 05/14/15 1000  Gross per 24 hour  Intake 997.65 ml  Output   3150 ml  Net -2152.35 ml   Filed Weights   05/13/15 1137 05/13/15 1815 05/14/15 0500  Weight: 307 lb 6 oz (139.424 kg) 309 lb 11.9 oz (140.5 kg) 308 lb 13.8 oz (140.1 kg)    PHYSICAL EXAM General: Well developed, well nourished, male in no acute distress. Head: Normocephalic, atraumatic.  Neck: Supple without bruits, JVD difficult to assess 2nd habitus. Lungs:  Resp regular and unlabored, decreased BS bases w/ rales. Heart: RRR, S1, S2, no S3, S4, or murmur; no rub. Abdomen: Soft, non-tender, non-distended, BS + x 4.  Extremities: No clubbing, cyanosis, 2 + edema.  Neuro: Alert and oriented X 3. Moves all extremities spontaneously. Psych: Normal affect.  LABS: CBC: Recent Labs  05/13/15 1142 05/14/15 0246  WBC 6.9 8.7  HGB 15.8 16.0  HCT 50.6 52.5*  MCV 97.1 97.8  PLT 149* [id number]   Basic Metabolic Panel: Recent Labs  05/13/15 1142 05/14/15 0246  NA 143 137  K 3.9 4.8  CL 105 99*    CO2 30 32  GLUCOSE 238* 420*  BUN 29* 32*  CREATININE 2.35* 2.57*  CALCIUM 8.6* 8.4*   Liver Function Tests: Recent Labs  05/14/15 0246  AST 14*  ALT 15*  ALKPHOS 92  BILITOT 0.4  PROT 5.8*  ALBUMIN 2.4*   Cardiac Enzymes: Recent Labs  05/13/15 1610 05/13/15 2203 05/14/15 0246  TROPONINI 0.23* 0.17* 0.14*    Recent Labs  05/13/15 1150  TROPIPOC 0.24*   BNP:  B NATRIURETIC PEPTIDE  Date/Time Value Ref Range Status  05/13/2015 11:42 AM 176.5* 0.0 - 100.0 pg/mL Final  01/18/2015 12:00 AM 279.0* 0.0 - 100.0 pg/mL Final   TELE:  SR, No sig ectopy      Radiology/Studies: Dg Chest 2 View  05/13/2015   CLINICAL DATA:  Shortness of breath intermittently getting worse for few weeks. Chest discomfort.  EXAM: CHEST  2 VIEW  COMPARISON:  Chest x-rays dated 02/05/2015 and 01/19/2015.  FINDINGS: Compared to the most recent study of 02/05/2015, there is a new dense opacity at the right lung base which could represent pneumonia or atelectasis with adjacent pleural effusion. There is improved aeration within the right upper lobe compatible with continued improvement of patient's previously described right upper lobe pneumonia.  There is mild interstitial prominence  bilaterally suggesting edema. Linear atelectasis versus chronic scarring again noted within the left mid lung region. No pneumothorax. Cardiomediastinal silhouette appears stable in size and configuration. No acute osseous abnormality.  IMPRESSION: New dense opacity at the right lung base, centered within the right middle lobe, which could represent pneumonia or atelectasis. Aspiration also possible. There is also a right pleural effusion which is probably small.  Cardiomegaly with central pulmonary vascular congestion and mild bilateral interstitial edema suggesting some degree of volume overload/ CHF.  Continued improvement of the previously described right upper lobe pneumonia. I suspect that the previous right upper lobe  pneumonia has completely resolved and that the linear opacities at the periphery of the right upper lobe are areas of chronic scarring (sequela of the previous pneumonia).   Electronically Signed   By: Franki Cabot M.D.   On: 05/13/2015 13:12     Current Medications:  . antiseptic oral rinse  7 mL Mouth Rinse BID  . aspirin  325 mg Oral Daily  . atorvastatin  40 mg Oral q1800  . carvedilol  25 mg Oral BID WC  . cloNIDine  0.2 mg Oral TID  . fluticasone  1-2 spray Each Nare BID  . folic acid  1 mg Oral Daily  . furosemide  80 mg Intravenous Q12H  . gemfibrozil  600 mg Oral BID AC  . Influenza vac split quadrivalent PF  0.5 mL Intramuscular Tomorrow-1000  . insulin aspart  0-20 Units Subcutaneous TID WC  . insulin aspart  0-5 Units Subcutaneous QHS  . [START ON 05/15/2015] insulin glargine  50 Units Subcutaneous Daily  . multivitamin with minerals  1 tablet Oral Daily  . omega-3 acid ethyl esters  2 g Oral BID  . sodium chloride  3 mL Intravenous Q12H  . thiamine  100 mg Oral Daily   Or  . thiamine  100 mg Intravenous Daily   . heparin 2,100 Units/hr (05/14/15 1000)    ASSESSMENT AND PLAN: Principal Problem:   Chest pain at rest - ez minimal elevation, do not think primary coronary vessel closure - echo ordered. - CRF reduction encouraged w/ lifestyle modifications - on ASA, BB, statin - MD advise if he needs MV and if should be inpt or outpt    CHF exacerbation - acute on chronic CHF, echo Pending, EF prev nl - Continue IV Lasix, amount of volume overload unclear, he does not weigh - however, was 290 lbs in June 2016 - BUN/Cr increasing, not on ACE/ARB - had 480 cc before 10 am today, suspect po intake is high  Otherwise, per IM Active Problems:   Hyperlipemia   Hypertension   Obstructive sleep apnea   Decreased peripheral vision of left eye   Respiratory distress  Signed, Rosaria Ferries , PA-C 11:14 AM 05/14/2015  The patient was seen, examined and discussed  with Rosaria Ferries, PA-C and I agree with the above.   49 yo male w/ hx CAP & resp failure, sepsis, ARF, acute encephalopathy 2015. Hx HTN, OSA, DM2, D-CHF, HL, tob use. Admitted 09/12 w/ sharp chest pain, hx cocaine use, volume overload, periph neuropathy. Troponin minimally elevated in the settings of sepsis and acute renal failure, ECG unchanged from June 2016. Prior echo LVEF 60%, repeat pending. I would treat for underlying pneumonia and schedule an outpatient stress test once her primary problem is resolved.  Continue iv lasix for acute on chronic diastolic CHF, we will monitor Crea closely. - 2.3 L in the last 24 hours,  now 308 lbs, baseline 290 lbs. Add amlodipine for hypertension, also would help with pulmonary hypertension.   Dorothy Spark 05/14/2015

## 2015-05-14 NOTE — Progress Notes (Signed)
Pt HS blood sugar was 434 spoke to Dr. Bonner Puna who ordered 15 units of novolog, also pt refused his Cpap explained rationale to him he still refused will continue to monitor.

## 2015-05-14 NOTE — Progress Notes (Signed)
Utilization review completed. Richerd Grime, RN, BSN. 

## 2015-05-14 NOTE — Progress Notes (Signed)
Family Medicine Teaching Service Daily Progress Note Intern Pager: [phone]  Patient name: Jerry Cantrell Medical record number: [id number] Date of birth: [birth year] Age: 49 y.o. Gender: male  Primary Care Provider: Benito Mccreedy, MD Consultants: Cardiology Code Status: Full  Pt Overview and Major Events to Date:  9/12: Admitted for ACS r/o and possible CHF exacerbation   Assessment and Plan: Christophere Cantrell is a 49 y.o. male presenting with one month of progressive dyspnea and lower extremity edema, as well as two days of chest tightness. PMH is significant for diastolic HFpEF, HTN, HLD, T2DM, CKD Stage III, OSA, and tobacco use.  CHF Exacerbation, ACS r/o: Most recent echo (01/18/15) with EF 50-55% and grade 1 diastolic dysfunction. Most likely diagnosis in this patient with history of HFpEF and progressive shortness of breath and edema is CHF exacerbation. This diagnosis is further supported by CXR notable for changes suggestive of volume overload and an elevated BNP. Of note, the patient has multiple risk factors for CAD including obesity, HLD, and T2DM and admission EKG was concerning for possible septal infarct with mild troponin elevation. Also of note, patient with recent admission for community acquired pneumonia complicated by sepsis, acute encephalopathy, and acute renal failure. CXR on admission shows resolution of previous RUL pneumonia with subsequent scarring, but is notable for a new dense opacity at the right lung base that could represent pneumonia or atelectasis. On presentation, he is without leukocytosis and has been afebrile; however, pneumonia remains on the differential diagnosis. - Considering new 6L O2 requirement, will admit to step down - Cardiology following, appreciate recommendations. - Telemetry - IV Lasix 80 mg q12h - Strict I&Os: good output  - Heparin drip until cardiac issues evaluated - echo pending to evaluate left ventricular function - Trending troponins:  0.24 >> 0.23 >> 0.17 >> 0.14  - Fasting lipid panel for risk stratification - Continue to counsel regarding use of tobacco and cocaine - O2 by [place], goal O2 sat >92%   Hypertension: Patient on carvedilol 25 mg PO BID, clonidine 0.2 mg PO BID, hydralazine 10 mg PO TID, metolazone 5 mg PO daily, and verapamil 120 mg ER qhs. - Continue carvedilol 25 mg PO BID and clonidine 0.2 mg PO BID - Hydralazine 5 mg IV PRN for systolics [id number] or diastolics [id number] - Holding other antihypertensives for now  OSA: During previous admissions, patient became severely hypoxic while asleep. He was evaluated recently on an outpatient basis and deemed appropriate for CPAP, but never had this delivered. - CPAP during admission  AKI on CKD Stage III: Baseline Cr 1.3-1.5; Cr 2.35 on admission with BUN:Cr <20:1. Likely secondary to volume overload. Followed by Nephrologist, Dr. Justin Mend. Patient reports that he was recently placed on the transplant list. - IV Lasix as above - Daily BMP, CTM  -Cr 2.57 (9/13) -UA protein >300, Albumin 2.9 and UPC 6.07; consider consulting nephrology   Type II Diabetes Mellitus: Most recent A1c 8.2 (05/02/15). Currently on Lantus 35 U qhs and 40 U qAM. Per note by PCP, blood glucose suboptimally controlled and he was instructed to increase to Lantus 50 U BID (05/02/15); however, he was concerned regarding AM lows and did not increase his Lantus dose as instructed. - Moderate SSI - Lantus 40 U qAM; consider increasing dose 2/2 to high CBGs   Depression: Stable on Elavil. - Hold home Elavil 50 mg PO qhs while in SDU  Tobacco Use: Currently attempting cessation with nicotine patches. - Nicotine patch prn during admission - Continue encouragement  of cessation  FEN/GI: NPO Prophylaxis: Heparin drip  Disposition: home pending improvement of acute hypoxemic respiratory failure   Subjective:  Reports that breathing is mildly better this morning. Concerned about increase in edema. Denies increase  in salt in diet or increase in fluid intake.   Objective: Temp:  [97.5 F (36.4 C)-98.3 F (36.8 C)] 98 F (36.7 C) (09/13 0403) Pulse Rate:  [56-89] 70 (09/13 0405) Resp:  [17-20] 18 (09/13 0405) BP: (131-174)/(72-95) 158/84 mmHg (09/13 0405) SpO2:  [87 %-96 %] 92 % (09/13 0405) Weight:  [307 lb 6 oz (139.424 kg)-309 lb 11.9 oz (140.5 kg)] 308 lb 13.8 oz (140.1 kg) (09/13 0500) Physical Exam: General: obese male  Cardiovascular: RRR. No murmurs appreciated  Respiratory: Mildly increased WOB. Diffuse bilateral crackles.  Abdomen: obese, +bs, soft, NT and mildly edematous  Extremities: 2+ pitting edema of LE to thighs, edema of hands bilaterally  Laboratory:  Recent Labs Lab 05/13/15 1142 05/14/15 0246  WBC 6.9 8.7  HGB 15.8 16.0  HCT 50.6 52.5*  PLT 149* 159    Recent Labs Lab 05/13/15 1142 05/14/15 0246  NA 143 137  K 3.9 4.8  CL 105 99*  CO2 30 32  BUN 29* 32*  CREATININE 2.35* 2.57*  CALCIUM 8.6* 8.4*  PROT  --  5.8*  BILITOT  --  0.4  ALKPHOS  --  92  ALT  --  15*  AST  --  14*  GLUCOSE 238* 420*    Imaging/Diagnostic Tests: ECHO May 2016 Left ventricle: Abnormal septal motion The cavity size was mildly dilated. There was mild concentric hypertrophy. Systolic function was normal. The estimated ejection fraction was in the range of 55% to 60%. Wall motion was normal; there were no regional wall motion abnormalities. - Left atrium: The atrium was mildly dilated. - Atrial septum: There was increased thickness of the septum, consistent with lipomatous hypertrophy. No defect or patent foramen ovale was identified. - Pulmonary arteries: PA peak pressure: 41 mm Hg (S).   EKG: Monday May 13 2015 11:44:07 EDT Ventricular Rate: 68 PR Interval: 184 QRS Duration: 122 QT Interval: 438 QTC Calculation: 465 R Axis:-48 Text Interpretation: Normal sinus rhythm. Left axis deviation. Septal infarct, age undetermined. Abnormal ECG. No  significant change since last tracing. Confirmed by Carmin Muskrat MD [phone]) on 05/13/2015 12:06:04 PM  Dg Chest 2 View  05/13/2015 CLINICAL DATA: Shortness of breath intermittently getting worse for few weeks. Chest discomfort. EXAM: CHEST 2 VIEW COMPARISON: Chest x-rays dated 02/05/2015 and 01/19/2015. FINDINGS: Compared to the most recent study of 02/05/2015, there is a new dense opacity at the right lung base which could represent pneumonia or atelectasis with adjacent pleural effusion. There is improved aeration within the right upper lobe compatible with continued improvement of patient's previously described right upper lobe pneumonia. There is mild interstitial prominence bilaterally suggesting edema. Linear atelectasis versus chronic scarring again noted within the left mid lung region. No pneumothorax. Cardiomediastinal silhouette appears stable in size and configuration. No acute osseous abnormality. IMPRESSION: New dense opacity at the right lung base, centered within the right middle lobe, which could represent pneumonia or atelectasis. Aspiration also possible. There is also a right pleural effusion which is probably small. Cardiomegaly with central pulmonary vascular congestion and mild bilateral interstitial edema suggesting some degree of volume overload/ CHF. Continued improvement of the previously described right upper lobe pneumonia. I suspect that the previous right upper lobe pneumonia has completely resolved and that the linear opacities at the  periphery of the right upper lobe are areas of chronic scarring (sequela of the previous pneumonia). Electronically Signed By: Franki Cabot M.D. On: 05/13/2015 13:12   Nicolette Bang, DO 05/14/2015, 8:24 AM PGY-1, [place] Intern pager: [phone], text pages welcome

## 2015-05-14 NOTE — Progress Notes (Signed)
Cbg=432. Gave scheduled sliding scale insulin, see mar. Notified MD and no orders received. Will continue to monitor.

## 2015-05-14 NOTE — Progress Notes (Signed)
Interval progress note  Contacted cardiology PA, Rhonda Barrett, concerning heparin drip. Per PA, as Dr. Meda Coffee felt he could have an outpatient stress test, it was ok to discontinue his heparin.   Archie Patten, MD [facility] Resident  05/14/2015, 2:29 PM

## 2015-05-14 NOTE — Progress Notes (Signed)
ANTICOAGULATION CONSULT NOTE - Follow Up Consult  Pharmacy Consult for Heparin Indication: chest pain/ACS   Patient Measurements: Height: 6\' 1"  (185.4 cm) Weight: (!) 307 lb 6 oz (139.424 kg) IBW/kg (Calculated) : 79.9 Heparin Dosing Weight: 111.5 kg  Labs:  Recent Labs  05/13/15 1142 05/13/15 1610 05/13/15 2203 05/14/15 0246 05/14/15 1105  HGB 15.8  --   --  16.0  --   HCT 50.6  --   --  52.5*  --   PLT 149*  --   --  159  --   HEPARINUNFRC  --   --  <0.10* 0.13* 0.16*  CREATININE 2.35*  --   --  2.57*  --   TROPONINI 0.24* 0.23* 0.17* 0.14*  --     Assessment: 49yo male on heparin for CP.  Heparin still sub-therapeutic at 0.16  Goal of Therapy:  Heparin level 0.3-0.7 units/ml   Plan:  Increase heparin to 2500 units / hr Follow up 6 hour heparin level  Thank you Anette Guarneri, PharmD [phone] 05/14/2015,12:53 PM

## 2015-05-14 NOTE — Progress Notes (Signed)
ANTICOAGULATION CONSULT NOTE - Follow Up Consult  Pharmacy Consult for Heparin  --> Lovenox for DVT ppx Indication: chest pain/ACS   Patient Measurements: Height: 6\' 1"  (185.4 cm) Weight: (!) 307 lb 6 oz (139.424 kg) IBW/kg (Calculated) : 79.9 Heparin Dosing Weight: 111.5 kg  Labs:  Recent Labs  05/13/15 1142 05/13/15 1610 05/13/15 2203 05/14/15 0246 05/14/15 1105  HGB 15.8  --   --  16.0  --   HCT 50.6  --   --  52.5*  --   PLT 149*  --   --  159  --   HEPARINUNFRC  --   --  <0.10* 0.13* 0.16*  CREATININE 2.35*  --   --  2.57*  --   TROPONINI 0.24* 0.23* 0.17* 0.14*  --     Assessment: 49yo male on heparin for CP.  Heparin still sub-therapeutic at 0.16  Goal of Therapy:  Heparin level 0.3-0.7 units/ml   Plan:  Increase heparin to 2500 units / hr Follow up 6 hour heparin level  Thank you Anette Guarneri, PharmD [phone] 05/14/2015,2:50 PM   Addendum --> changed Lovenox to 70 mg sq Q 24 hours  Thank you Anette Guarneri, PharmD [phone]

## 2015-05-14 NOTE — Consult Note (Addendum)
Reason for Consult: Acute renal failure on chronic kidney disease stage III, nephrotic range proteinuria, anasarca Referring Physician: Rory Percy M.D. [facility] Teaching Service)  HPI:  49 year old Caucasian man with past medical history significant for chronic kidney disease stage III with nephrotic range proteinuria that appears to be from underlying diabetes (poorly controlled with associated retinopathy and neuropathy) and hypertension. He also has a history of dyslipidemia and chronic back pain from lumbar stenosis. He was admitted to the hospital with progressive dyspnea and worsening lower extremity edema over the past month and also reported some chest tightness for 1 or 2 days prior to hospitalization. He reports that he was taking furosemide as prescribed at 80 mg once a day and occasionally took it twice a day (although stopped doing this when he did not notice any benefit). He denies taking any NSAIDs and reports that his socioeconomic status limits him from following a strict low-sodium diet. He denies any recent nausea, vomiting or diarrhea. Denies any fever or chills and does not have any sputum production with his intermittent cough. Continues to smoke.   He was seen by Dr. Edrick Oh at [facility] on 03/21/2015 to establish care for his chronic kidney disease. At that visit, his creatinine was 2.2 and urine protein/creatinine ratio was 6.9 g/gram. I suspect that he has had progression of his chronic kidney disease since his previous recorded baseline creatinine of 1.3-1.5. He seems to think that he is on a list for kidney transplant-I do not see a note of this from his visit with Dr. Justin Mend (I suspect that he is probably misguided regarding this).  Past Medical History  Diagnosis Date  . Hypertension   . Hyperlipidemia   . Meralgia paraesthetica 05/03/2012  . Lumbar spondylosis 05/03/2012  . Lumbar spinal stenosis 05/03/2012    Mild with only right L4 nerve  root encroachment, no neurogenic claudication   . Urinary incontinence 05/12/2012    Since the start of Sept. 2013   . Hemorrhoids 05/12/2012  . UTI (lower urinary tract infection)   . Meralgia paraesthetica 05/03/2012    On Lyrica which does improve pain.   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 Substance abuse   . Hyperlipidemia   . Pneumonia   . Diabetes mellitus   . 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 Acute diastolic heart failure   . Retinopathy   . CHF (congestive heart failure)   . Obesity     Past Surgical History  Procedure Laterality Date  . Tonsillectomy    . Abdominal surgery      Abscess I&D 2/2 infected hair  . Colonoscopy w/ polypectomy      pt to bring records    Family History  Problem Relation Age of Onset  . Prostate cancer Father   . Alcohol abuse Father   . Emphysema Father     smoked    Social History:  reports that he has been smoking Cigarettes.  He has a 12 pack-year smoking history. He has never used smokeless tobacco. He reports that he drinks about 3.6 oz of alcohol per week. He reports that he uses illicit drugs (Marijuana).  Allergies: No Known Allergies  Medications:  Scheduled: . amLODipine  5 mg Oral Daily  . antiseptic oral rinse  7 mL Mouth Rinse BID  . aspirin  325 mg Oral Daily  . atorvastatin  40 mg Oral q1800  . carvedilol  25 mg Oral BID WC  . cloNIDine  0.2  mg Oral TID  . fluticasone  1-2 spray Each Nare BID  . folic acid  1 mg Oral Daily  . furosemide  80 mg Intravenous Q12H  . gemfibrozil  600 mg Oral BID AC  . Influenza vac split quadrivalent PF  0.5 mL Intramuscular Tomorrow-1000  . insulin aspart  0-20 Units Subcutaneous TID WC  . insulin aspart  0-5 Units Subcutaneous QHS  . insulin glargine  10 Units Subcutaneous Once  . [START ON 05/15/2015] insulin glargine  50 Units Subcutaneous Daily  . multivitamin with minerals  1 tablet Oral Daily  . omega-3 acid ethyl esters  2 g Oral BID  . sodium chloride  3 mL  Intravenous Q12H  . thiamine  100 mg Oral Daily   Or  . thiamine  100 mg Intravenous Daily    BMP Latest Ref Rng 05/14/2015 05/13/2015 02/05/2015  Glucose 65 - 99 mg/dL 420(H) 238(H) 404(H)  BUN 6 - 20 mg/dL 32(H) 29(H) 28(H)  Creatinine 0.61 - 1.24 mg/dL 2.57(H) 2.35(H) 2.34(H)  Sodium 135 - 145 mmol/L 137 143 134(L)  Potassium 3.5 - 5.1 mmol/L 4.8 3.9 3.9  Chloride 101 - 111 mmol/L 99(L) 105 89(L)  CO2 22 - 32 mmol/L 32 30 33(H)  Calcium 8.9 - 10.3 mg/dL 8.4(L) 8.6(L) 9.8   CBC Latest Ref Rng 05/14/2015 05/13/2015 02/05/2015  WBC 4.0 - 10.5 K/uL 8.7 6.9 5.8  Hemoglobin 13.0 - 17.0 g/dL 16.0 15.8 16.0  Hematocrit 39.0 - 52.0 % 52.5(H) 50.6 50.1  Platelets 150 - 400 K/uL 159 149(L) 211   Urine protein/creatinine ratio 6.07  Dg Chest 2 View  05/13/2015   CLINICAL DATA:  Shortness of breath intermittently getting worse for few weeks. Chest discomfort.  EXAM: CHEST  2 VIEW  COMPARISON:  Chest x-rays dated 02/05/2015 and 01/19/2015.  FINDINGS: Compared to the most recent study of 02/05/2015, there is a new dense opacity at the right lung base which could represent pneumonia or atelectasis with adjacent pleural effusion. There is improved aeration within the right upper lobe compatible with continued improvement of patient's previously described right upper lobe pneumonia.  There is mild interstitial prominence bilaterally suggesting edema. Linear atelectasis versus chronic scarring again noted within the left mid lung region. No pneumothorax. Cardiomediastinal silhouette appears stable in size and configuration. No acute osseous abnormality.  IMPRESSION: New dense opacity at the right lung base, centered within the right middle lobe, which could represent pneumonia or atelectasis. Aspiration also possible. There is also a right pleural effusion which is probably small.  Cardiomegaly with central pulmonary vascular congestion and mild bilateral interstitial edema suggesting some degree of volume  overload/ CHF.  Continued improvement of the previously described right upper lobe pneumonia. I suspect that the previous right upper lobe pneumonia has completely resolved and that the linear opacities at the periphery of the right upper lobe are areas of chronic scarring (sequela of the previous pneumonia).   Electronically Signed   By: Franki Cabot M.D.   On: 05/13/2015 13:12    Review of Systems  Constitutional: Positive for malaise/fatigue. Negative for fever, chills and diaphoresis.  Eyes: Positive for blurred vision. Negative for double vision, photophobia and discharge.  Respiratory: Positive for cough and shortness of breath. Negative for hemoptysis, sputum production and wheezing.   Cardiovascular: Positive for chest pain, orthopnea and leg swelling. Negative for palpitations and claudication.  Gastrointestinal: Negative.   Genitourinary: Negative.   Musculoskeletal: Positive for back pain.  Skin: Negative.   Neurological: Positive  for weakness. Negative for dizziness, tingling and tremors.  Psychiatric/Behavioral: The patient is nervous/anxious.    Blood pressure 134/75, pulse 70, temperature 98 F (36.7 C), temperature source Oral, resp. rate 14, height 6\' 1"  (1.854 m), weight 140.1 kg (308 lb 13.8 oz), SpO2 96 %. Physical Exam  Nursing note and vitals reviewed. Constitutional: He is oriented to person, place, and time. He appears well-developed and well-nourished.  HENT:  Head: Normocephalic and atraumatic.  Nose: Nose normal.  Mouth/Throat: Oropharynx is clear and moist. No oropharyngeal exudate.  Eyes: Conjunctivae are normal. Pupils are equal, round, and reactive to light. No scleral icterus.  Neck: Normal range of motion. Neck supple. JVD present. No thyromegaly present.  10 cm JVP  Cardiovascular: Normal rate, regular rhythm and normal heart sounds.  Exam reveals no friction rub.   No murmur heard. Respiratory: Effort normal. He has wheezes. He has rales.  Fine  bibasal rales with intermittent expiratory wheeze  GI: Soft. Bowel sounds are normal. He exhibits distension. There is no tenderness. There is no rebound and no guarding.  Musculoskeletal: Normal range of motion. He exhibits edema.  2+  anasarca  Neurological: He is alert and oriented to person, place, and time.  Skin: Skin is warm and dry. No rash noted. No erythema.    Assessment/Plan: 1. Acute renal failure on chronic kidney disease stage IIIB: Suspected to be hemodynamically mediated by what appears to be volume shifts associated with nephrotic syndrome and possibly CHF exacerbation (pattern suggests diastolic dysfunction given previously normal ejection fraction). Baseline chronic kidney disease with nephrotic range proteinuria is from poorly controlled diabetes mellitus. The cornerstone of his management at this time will be optimization of diuretic therapy which may initially lead to elevation of creatinine due to RAS activation/mild intravascular volume depletion. Indeed, his compliance with diuretics may have been optimal but unrestricted sodium intake may likely have tipped him into the hypervolemic state that he is currently in. Good response to diuretic therapy overnight and I will continue him on the current dose of furosemide 80 mg twice a day. Hold metolazone and this point. 2. Nephrotic range proteinuria/anasarca/volume overload: Workup that was previously done by Dr. Justin Mend points overwhelmingly to diabetes associated nephrotic range proteinuria-I will empirically check an antinuclear antibody and anti-double-stranded DNA along with hepatitis panel at this time. There is limited utility of renal biopsy at this time unless any of these above serologies is positive. The cornerstone of management at this time with blood pressure control, sodium restriction and an attempt RAS blockade if/when possible or the use of non-dihydropyridine calcium channel blockers for medication of proteinuria. 3.  Chest pain: Ongoing cardiology evaluation and cycling of cardiac enzymes for possible ACS. 4. Hypertension: Currently on amlodipine 5 mg daily, carvedilol 25 mg twice a day, clonidine 0.2 mg 3 times a day and intravenous furosemide 80 mg twice a day. Also on when necessary hydralazine for blood pressure control. 5. Tobacco abuse/alcohol abuse: On thiamine/multivitamin and withdrawal precautions.   Kiele Heavrin K. 05/14/2015, 1:42 PM

## 2015-05-15 ENCOUNTER — Inpatient Hospital Stay (HOSPITAL_COMMUNITY): Payer: Medicaid Other

## 2015-05-15 DIAGNOSIS — R0600 Dyspnea, unspecified: Secondary | ICD-10-CM

## 2015-05-15 DIAGNOSIS — R7989 Other specified abnormal findings of blood chemistry: Secondary | ICD-10-CM

## 2015-05-15 LAB — GLUCOSE, CAPILLARY
GLUCOSE-CAPILLARY: 84 mg/dL (ref 65–99)
GLUCOSE-CAPILLARY: 319 mg/dL — AB (ref 65–99)
Glucose-Capillary: 143 mg/dL — ABNORMAL HIGH (ref 65–99)
Glucose-Capillary: 239 mg/dL — ABNORMAL HIGH (ref 65–99)

## 2015-05-15 LAB — HEPATITIS PANEL, ACUTE
HCV Ab: <0.1 s/co ratio (ref 0.0–0.9)
HEP A IGM: NEGATIVE
HEP B C IGM: NEGATIVE
Hepatitis B Surface Ag: NEGATIVE

## 2015-05-15 LAB — BASIC METABOLIC PANEL
ANION GAP: 8 (ref 5–15)
BUN: 31 mg/dL — AB (ref 6–20)
CALCIUM: 8.5 mg/dL — AB (ref 8.9–10.3)
CO2: 36 mmol/L — ABNORMAL HIGH (ref 22–32)
Chloride: 97 mmol/L — ABNORMAL LOW (ref 101–111)
Creatinine, Ser: 2.34 mg/dL — ABNORMAL HIGH (ref 0.61–1.24)
GFR calc Af Amer: 36 mL/min — ABNORMAL LOW (ref >60)
GFR, EST NON AFRICAN AMERICAN: 31 mL/min — AB (ref >60)
GLUCOSE: 178 mg/dL — AB (ref 65–99)
Potassium: 4.1 mmol/L (ref 3.5–5.1)
Sodium: 141 mmol/L (ref 135–145)

## 2015-05-15 LAB — LIPID PANEL
CHOL/HDL RATIO: 5.2 ratio
Cholesterol: 191 mg/dL (ref 0–200)
HDL: 37 mg/dL — ABNORMAL LOW (ref >40)
LDL Cholesterol: 126 mg/dL — ABNORMAL HIGH (ref 0–99)
Triglycerides: 141 mg/dL (ref <150)
VLDL: 28 mg/dL (ref 0–40)

## 2015-05-15 LAB — CBC
HCT: 52.5 % — ABNORMAL HIGH (ref 39.0–52.0)
Hemoglobin: 15.7 g/dL (ref 13.0–17.0)
MCH: 29.7 pg (ref 26.0–34.0)
MCHC: 29.9 g/dL — ABNORMAL LOW (ref 30.0–36.0)
MCV: 99.2 fL (ref 78.0–100.0)
PLATELETS: 145 10*3/uL — AB (ref 150–400)
RBC: 5.29 MIL/uL (ref 4.22–5.81)
RDW: 16 % — AB (ref 11.5–15.5)
WBC: 8.4 10*3/uL (ref 4.0–10.5)

## 2015-05-15 LAB — ANTINUCLEAR ANTIBODIES, IFA: ANA Ab, IFA: NEGATIVE

## 2015-05-15 LAB — ANTI-DNA ANTIBODY, DOUBLE-STRANDED: DS DNA AB: 1 [IU]/mL (ref 0–9)

## 2015-05-15 MED ORDER — AMLODIPINE BESYLATE 10 MG PO TABS
10.0000 mg | ORAL_TABLET | Freq: Every day | ORAL | Status: DC
  Filled 2015-05-15: qty 1

## 2015-05-15 MED ORDER — AMLODIPINE BESYLATE 5 MG PO TABS
5.0000 mg | ORAL_TABLET | Freq: Once | ORAL | Status: AC
Start: 2015-05-15 — End: 2015-05-15
  Administered 2015-05-15: 5 mg via ORAL
  Filled 2015-05-15: qty 1

## 2015-05-15 MED ORDER — ATORVASTATIN CALCIUM 80 MG PO TABS
80.0000 mg | ORAL_TABLET | Freq: Every day | ORAL | Status: DC
  Administered 2015-05-15 – 2015-05-17 (×3): 80 mg via ORAL
  Filled 2015-05-15 (×4): qty 1

## 2015-05-15 NOTE — Discharge Summary (Signed)
[facility]  Patient name: Jerry Cantrell Medical record number: [id number] Date of birth: [birth year] Age: 49 y.o. Gender: male Date of Admission: 05/13/2015  Date of Discharge: 05/18/2015  Admitting Physician: Leeanne Rio, MD  Primary Care Vladimir Lenhoff: Benito Mccreedy, MD Consultants: Nephro, Cardiology  Indication for Hospitalization: CHF exacerbation   Discharge Diagnoses/Problem List:  Patient Active Problem List   Diagnosis Date Noted  . Pleural effusion   . Acute on chronic diastolic CHF (congestive heart failure), NYHA class 3   . Elevated troponin   . Chest pain at rest 05/13/2015  . Respiratory distress 05/13/2015  . CHF exacerbation 05/13/2015  . CAP (community acquired pneumonia) 01/18/2015  . Acute renal failure superimposed on stage 3 chronic kidney disease 01/18/2015  . Respiratory acidosis   . Thrombocytopenia 12/04/2013  . Diastolic heart failure secondary to hypertension 11/30/2013  . Radiculopathy with lower extremity symptoms 11/28/2013  . Dyspnea 09/16/2013  . Depression 06/16/2013  . Proliferative diabetic retinopathy 06/08/2013  . Diabetic macular edema of left eye, with cataract, associated with type 2 diabetes mellitus 06/08/2013  . Meibomianitis 06/08/2013  . Erectile dysfunction associated with type 2 diabetes mellitus 04/22/2013  . Decreased peripheral vision of left eye 04/12/2013  . Plantar fasciitis of right foot 04/12/2013  . Cough 02/09/2013  . Obstructive sleep apnea 02/02/2013  . Diabetic peripheral neuropathy associated with type 2 diabetes mellitus 12/27/2012  . Abnormality of gait 12/06/2012  . Family history of malignant neoplasm of gastrointestinal tract 10/10/2012  . Personal history of colonic polyps 10/10/2012  . Smoker 08/18/2012  . Skin tag 08/18/2012  . Chronic low back pain 07/15/2012  . Preventative health care 06/02/2012  . Hyperlipemia 05/12/2012  . Hypertension 05/12/2012   . Urinary incontinence 05/12/2012  . Hemorrhoids 05/12/2012  . Blood per rectum 05/12/2012  . Meralgia paraesthetica 05/03/2012  . Lumbar spondylosis 05/03/2012  . Lumbar spinal stenosis 05/03/2012  . Insulin dependent type 2 diabetes mellitus, uncontrolled 05/13/1999    Disposition: Home  Discharge Condition: Stable  Discharge Exam:  Gen: Obese, heavily tattooed male sitting in chair without oxygen CV: Regular rate, no JVD, 1+ symmetrical LE edema Pulm: Decreased bibasilar breath sounds without wheezes or crackles  Brief Hospital Course:  Jerry Cantrell is a 49 y.o. male who presented with one month of progressive dyspnea and lower extremity edema and 17lbs weight gain consistent with heart failure exacerbation. His history is significant for HFpEF, HTN, HLD, T2DM, stage III CKD, OSA, and tobacco use.   Jerry Cantrell admitting presentation including volume overload, dyspnea, and hypoxemia without changes in sputum, fever, or acute onset was most consistent with CHF exacerbation. Troponin was mildly elevated at 0.14 with nonspecific ECG changes (see below for interpretation and elsewhere in EMR for tracing). Heparin was started initially for concern of ACS though troponin plateaued consistent with demand ischemia and no invasive diagnostic or therapeutic intervention was undertaken. He will, however, need a stress test as an outpatient.   IV lasix was started with good response, leading to 11lbs weight loss (309 to 298lbs). Volume overload was multifactorial as he demonstrated nephrotic range proteinuria which is thought to be due entirely to diabetic nephropathy. No biopsy is planned. He was comfortable on room air on 05/18/2015 when he stated he would be leaving the hospital to be with his son before his son went to prison for 4 years. Though he is certainly not completely diuresed, he is thought to be stable for discharge with instructions for  continued diuretic and potassium supplementation  taper (see AVS).   CXR demonstrated right lung base opacity concerning for atelectasis or pneumonia. After diuresis a CT chest was obtained with the most significant findings including pulmonary nodules (see below) which, with his history of tobacco use, indicate a follow up surveillance CT chest in 6 months.   Jerry Cantrell demonstrated severely resistant hypertensive throughout the hospital stay. He reported former good control on his home regimen so this was recontinued upon discharge.   Issues for Follow Up:  1. Pulmonary nodules noted on CT chest without significant change from prior, and new 9mm nodule. With history of smoking, recommend repeat CT imaging in 6 months (~11/13/2014). 2. Continue risk factor modification including control of blood sugar, blood pressure, cholesterol and tobacco cessation.  3. Will need cardiology follow up for outpatient stress test. 4. Is to obtain CPAP soon following discharge for recently diagnosed OSA. 5. He is to contact his nephrologist, Dr. Justin Mend, for follow up of diuresis.   Significant Procedures: none  Significant Labs and Imaging:   Recent Labs Lab 05/16/15 0335 05/17/15 0520 05/18/15 0600  WBC 6.9 7.0 6.0  HGB 14.9 15.4 15.3  HCT 49.2 49.4 49.5  PLT 154 160 156    Recent Labs Lab 05/14/15 0246 05/15/15 1000 05/16/15 0335 05/17/15 0520 05/18/15 0600  NA 137 141 143 142 142  K 4.8 4.1 3.2* 3.8 3.6  CL 99* 97* 99* 99* 97*  CO2 32 36* 38* 34* 38*  GLUCOSE 420* 178* 134* 173* 65  BUN 32* 31* 28* 28* 27*  CREATININE 2.57* 2.34* 2.03* 2.00* 2.06*  CALCIUM 8.4* 8.5* 8.5* 8.8* 9.0  ALKPHOS 92  --   --   --   --   AST 14*  --   --   --   --   ALT 15*  --   --   --   --   ALBUMIN 2.4*  --   --   --   --     05/15/2015   Cholesterol 191  Triglycerides 141  HDL  37 (L)  LDL (calc) 126 (H)  VLDL 28   CHOL/HDL  5.2    05/14/2015   ANA Ab, IFA Negative  ds DNA Ab 1  Hep A IgM Negative  HepB Surf Ag Negative  Hep B C IgM Negative   HCV Ab <0.1    05/13/2015 23:59  Appearance CLOUDY (A)  Bacteria, UA RARE  Bilirubin Urine NEGATIVE  Casts HYALINE CASTS (A)  Color, Urine YELLOW  Glucose >1000 (A)  Hgb urine dipstick TRACE (A)  Ketones, ur NEGATIVE  Leukocytes, UA NEGATIVE  Nitrite NEGATIVE  pH 5.0  Protein >300 (A)  RBC / HPF 3-6  Specific Gravity, Urine 1.018  Squamous Epithelial / LPF RARE  Urobilinogen, UA 0.2  WBC, UA 3-6  Total Protein, Urine 439  Protein Creatinine Ratio 6.07 (H)  Creatinine, Urine 72.33   ECG 05/14/2015: Normal sinus rhythm Anterior early repolarization without ST elevation/depression No significant change Confirmed by Caryl Comes MD, STEVE on 05/14/2015 10:28:23 PM  CXR 2v 05/15/2015 There is slightly less consolidation in the right base. There is a persistent right effusion. There is mild atelectasis in the left mid lower lung zones. There is also mild atelectasis in the right mid lung which is stable. No new opacities apparent. Heart is mildly enlarged with pulmonary vascularity within normal limits. No adenopathy. There is degenerative change in the thoracic spine.  IMPRESSION: Slightly less consolidation right  base. Right effusion is not felt to be appreciably changed. Patchy atelectasis bilaterally is stable. There is stable cardiomegaly. No new opacity.  2D ECHOCARDIOGRAM 05/15/2015 - Left ventricle: The cavity size was mildly dilated. Wall thickness was increased in a pattern of moderate LVH. The estimated ejection fraction was 55%. Doppler parameters are consistent with abnormal left ventricular relaxation (grade 1 diastolic dysfunction). - Left atrium: The atrium was moderately dilated. - Atrial septum: No defect or patent foramen ovale was identified.  CT CHEST WITHOUT CONTRAST 05/15/2015 COMPARISON: 03/05/2010  FINDINGS: Aorta normal caliber.  Calcified RIGHT hilar and subcarinal lymph nodes.  Additional noncalcified mediastinal nodes largest RIGHT  peritracheal 11 mm diameter image 22 and LEFT paratracheal 12 mm diameter.  Visualized upper abdomen unremarkable.  Heart enlarged.  Small to moderate RIGHT pleural effusion.  Calcified granulomata RIGHT upper and RIGHT lower lobes.  Atelectasis of RIGHT lower lobe.  5 mm slightly ill-defined nodule inferior RIGHT upper lobe image 26 unchanged.  Posterior RIGHT upper lobe nodule 4 mm diameter image 16 unchanged.  Mild compressive atelectasis at posterior aspects of the RIGHT middle and RIGHT upper lobes.  Focal area of slightly nodular scarring antral lateral RIGHT middle lobe base image 40 unchanged.  Dependent atelectasis LEFT lower lobe.  5 mm LEFT lower lobe nodule image 37 not definitely seen previously.  No acute infiltrate or pneumothorax.  Scattered endplate spur formation throughout mid to inferior thoracic spine.  IMPRESSION: Small to moderate RIGHT pleural effusion.  Old granulomatous disease.  Scattered atelectasis greatest in RIGHT lower lobe.  BILATERAL pulmonary nodules, some stable, 5 mm nodule LEFT lower lobe not definitely previously visualized; recommendation below.  If the patient is at high risk for bronchogenic carcinoma, follow-up chest CT at 6-12 months is recommended. If the patient is at low risk for bronchogenic carcinoma, follow-up chest CT at 12 months is recommended.   Results/Tests Pending at Time of Discharge: None  Discharge Medications:    Medication List    STOP taking these medications        gemfibrozil 600 MG tablet  Commonly known as:  LOPID      TAKE these medications        albuterol 108 (90 BASE) MCG/ACT inhaler  Commonly known as:  PROVENTIL HFA;VENTOLIN HFA  Inhale 2 puffs into the lungs every 4 (four) hours as needed for wheezing or shortness of breath.     amitriptyline 50 MG tablet  Commonly known as:  ELAVIL  Take 1 tablet (50 mg total) by mouth at bedtime.     amitriptyline 50 MG  tablet  Commonly known as:  ELAVIL  Take 50 mg by mouth.     aspirin 325 MG tablet  Take 1 tablet (325 mg total) by mouth daily.     atorvastatin 80 MG tablet  Commonly known as:  LIPITOR  Take 1 tablet (80 mg total) by mouth daily.     carvedilol 25 MG tablet  Commonly known as:  COREG  Take 1 tablet (25 mg total) by mouth 2 (two) times daily with a meal.     cloNIDine 0.2 MG tablet  Commonly known as:  CATAPRES  Take 1 tablet (0.2 mg total) by mouth 3 (three) times daily.     fenofibrate 160 MG tablet  Take 1 tablet (160 mg total) by mouth daily.     fluticasone 50 MCG/ACT nasal spray  Commonly known as:  FLONASE  Place 1-2 sprays into both nostrils 2 (two) times daily.  furosemide 80 MG tablet  Commonly known as:  LASIX  Take 1 tablet (80 mg total) by mouth daily as needed for edema.     furosemide 80 MG tablet  Commonly known as:  LASIX  1 tab 3 times a day for 3 days, then 2 times a day for 3 days, then once a day     gabapentin 300 MG capsule  Commonly known as:  NEURONTIN  Take 1 capsule (300 mg total) by mouth 3 (three) times daily. 200 mg in the morning and 100 mg in the evening.     glucose blood test strip  Use as instructed     hydrALAZINE 100 MG tablet  Commonly known as:  APRESOLINE  Take 1 tablet (100 mg total) by mouth 3 (three) times daily.     insulin aspart 100 UNIT/ML injection  Commonly known as:  novoLOG  Inject 10-30 Units into the skin 3 (three) times daily before meals. Per sliding scale     insulin glargine 100 UNIT/ML injection  Commonly known as:  LANTUS  Inject 0.4 mLs (40 Units total) into the skin 2 (two) times daily.     LOVAZA 1 G capsule  Generic drug:  omega-3 acid ethyl esters  Take 2 g by mouth 2 (two) times daily.     nicotine 21 mg/24hr patch  Commonly known as:  NICODERM CQ - dosed in mg/24 hours  Place 1 patch (21 mg total) onto the skin daily.     nicotine 14 mg/24hr patch  Commonly known as:  NICODERM CQ   Place 1 patch (14 mg total) onto the skin daily.     Potassium Chloride ER 20 MEQ Tbcr  take 2 tabs (64mEq) for 6 days, then 1 tab (24mEq) daily     REFRESH OP  Place 2 drops into both eyes 3 (three) times daily as needed (dry eyes).     verapamil 120 MG CR tablet  Commonly known as:  CALAN-SR  Take 1 tablet (120 mg total) by mouth at bedtime.        Discharge Instructions: Please refer to Patient Instructions section of EMR for full details.  Patient was counseled important signs and symptoms that should prompt return to medical care, changes in medications, dietary instructions, activity restrictions, and follow up appointments.   Follow-Up Appointments: Follow-up Information    Follow up with OSEI-BONSU,GEORGE, MD.   Specialty:  Internal Medicine   Contact information:   [id number] [place] [address] [phone]       Follow up with Sherril Croon, MD. Call on 05/20/2015.   Specialty:  Nephrology   Contact information:   [place] [address] [phone]       Patrecia Pour, MD 05/18/2015, 8:59 PM PGY-3, [place]

## 2015-05-15 NOTE — Progress Notes (Signed)
  Echocardiogram 2D Echocardiogram has been performed.  Johny Chess 05/15/2015, 8:50 AM

## 2015-05-15 NOTE — Progress Notes (Signed)
Family Medicine Teaching Service Daily Progress Note Intern Pager: [phone]  Patient name: Jerry Cantrell Medical record number: [id number] Date of birth: [birth year] Age: 49 y.o. Gender: male  Primary Care Provider: Benito Mccreedy, MD Consultants: Cardiology Code Status: Full  Pt Overview and Major Events to Date:  9/12: Admitted for ACS r/o and possible CHF exacerbation  9/14: Echo: LVEF [id number], Grade I Diastolic Dysfuction  Assessment and Plan: Jerry Cantrell is a 49 y.o. male presenting with one month of progressive dyspnea and lower extremity edema, as well as two days of chest tightness. PMH is significant for diastolic HFpEF, HTN, HLD, T2DM, CKD Stage III, OSA, and tobacco use.  CHF Exacerbation, ACS r/o: Most recent echo (01/18/15) with EF 50-55% and grade 1 diastolic dysfunction. Most likely diagnosis in this patient with history of HFpEF and progressive shortness of breath and edema is CHF exacerbation. This diagnosis is further supported by CXR notable for changes suggestive of volume overload and an elevated BNP. Of note, the patient has multiple risk factors for CAD including obesity, HLD, and T2DM and admission EKG was concerning for possible septal infarct with mild troponin elevation. Also of note, patient with recent admission for community acquired pneumonia complicated by sepsis, acute encephalopathy, and acute renal failure. CXR on admission shows resolution of previous RUL pneumonia with subsequent scarring, but is notable for a new dense opacity at the right lung base that could represent pneumonia or atelectasis. Repeat CXR demonstrated slightly less consolidation at R lung base. On presentation, he is without leukocytosis and has been afebrile; however, pneumonia remains on the differential diagnosis. - Cardiology following, appreciate recommendations. - Telemetry - IV Lasix 80 mg q12h - Strict I&Os: good output  - echo: LVEF 55%, mild LVH, Grade I diastolic dysfunction  -  Fasting lipid panel for risk stratification: Cholesterol 191, HDl 37, LCl 126 - Continue to counsel regarding use of tobacco and cocaine - O2 3L [place]; sating 95-97% -10 year risk of CVD 28.0% >> increase Lipitor to 80 mg daily  -daily weights: 309 on admission >>308 >> 306 (3 lb weight loss total) -CT chest d/t h/o of PNA    Hypertension: Patient on carvedilol 25 mg PO BID, clonidine 0.2 mg PO BID, hydralazine 10 mg PO TID, metolazone 5 mg PO daily, and verapamil 120 mg ER qhs. - Continue carvedilol 25 mg PO BID and clonidine 0.2 mg PO BID - Hydralazine 5 mg IV PRN for systolics [id number] or diastolics [id number] - increase amlodipine to 10 mg daily  OSA: During previous admissions, patient became severely hypoxic while asleep. He was evaluated recently on an outpatient basis and deemed appropriate for CPAP, but never had this delivered. - CPAP during admission  AKI on CKD Stage III: Baseline Cr 1.3-1.5; Cr 2.35 on admission with BUN:Cr <20:1. Likely secondary to volume overload. Followed by Nephrologist, Dr. Justin Mend. Patient reports that he was recently placed on the transplant list. - IV Lasix as above - Daily BMP, CTM  -Cr 2.57 (9/13) -UA protein >300, Albumin 2.9 and UPC 6.07; consider consulting nephrology  -nephro following; appreciate recs >>baseline CKD with nephrotic range proteinuria from poorly controlled DM, volume shifts associated with nephrotic syndrome and possibly CHF exacerbation   Type II Diabetes Mellitus: Most recent A1c 8.2 (05/02/15). Currently on Lantus 35 U qhs and 40 U qAM. Per note by PCP, blood glucose suboptimally controlled and he was instructed to increase to Lantus 50 U BID (05/02/15); however, he was concerned regarding AM lows and did not  increase his Lantus dose as instructed. - Moderate SSI - Lantus 50 U qAM  Depression: Stable on Elavil. - Hold home Elavil 50 mg PO qhs while in SDU  Tobacco Use: Currently attempting cessation with nicotine patches. - Nicotine patch prn  during admission - Continue encouragement of cessation  FEN/GI: NPO Prophylaxis: Lovenox 70 mg q 24hr  Disposition: home pending improvement of acute hypoxemic respiratory failure   Subjective:  Breathing continues to improve. Tearful about son leaving for prison on Monday. Reporting pain over left anterior costal margin.    Objective: Temp:  [97.9 F (36.6 C)-98.1 F (36.7 C)] 97.9 F (36.6 C) (09/14 0523) Pulse Rate:  [65-73] 67 (09/13 2320) Resp:  [14-33] 23 (09/13 1951) BP: (134-176)/(75-92) 167/79 mmHg (09/13 2320) SpO2:  [87 %-97 %] 95 % (09/13 2320) Weight:  [306 lb (138.8 kg)] 306 lb (138.8 kg) (09/14 0500) Physical Exam: General: obese male  Cardiovascular: RRR. No murmurs appreciated  Respiratory: Mildly increased WOB. Diminished breath sounds at bases. Diffuse crackles.  Abdomen: obese, +bs, soft, NT and mildly edematous  Extremities: 2+ pitting edema of LE to thighs, edema of hands bilaterally  Laboratory:  Recent Labs Lab 05/13/15 1142 05/14/15 0246  WBC 6.9 8.7  HGB 15.8 16.0  HCT 50.6 52.5*  PLT 149* 159    Recent Labs Lab 05/13/15 1142 05/14/15 0246  NA 143 137  K 3.9 4.8  CL 105 99*  CO2 30 32  BUN 29* 32*  CREATININE 2.35* 2.57*  CALCIUM 8.6* 8.4*  PROT  --  5.8*  BILITOT  --  0.4  ALKPHOS  --  92  ALT  --  15*  AST  --  14*  GLUCOSE 238* 420*    Imaging/Diagnostic Tests: ECHO May 2016 Left ventricle: Abnormal septal motion The cavity size was mildly dilated. There was mild concentric hypertrophy. Systolic function was normal. The estimated ejection fraction was in the range of 55% to 60%. Wall motion was normal; there were no regional wall motion abnormalities. - Left atrium: The atrium was mildly dilated. - Atrial septum: There was increased thickness of the septum, consistent with lipomatous hypertrophy. No defect or patent foramen ovale was identified. - Pulmonary arteries: PA peak pressure: 41 mm Hg  (S).   EKG: Monday May 13 2015 11:44:07 EDT Ventricular Rate: 68 PR Interval: 184 QRS Duration: 122 QT Interval: 438 QTC Calculation: 465 R Axis:-48 Text Interpretation: Normal sinus rhythm. Left axis deviation. Septal infarct, age undetermined. Abnormal ECG. No significant change since last tracing. Confirmed by Carmin Muskrat MD [phone]) on 05/13/2015 12:06:04 PM  Dg Chest 2 View  05/13/2015 CLINICAL DATA: Shortness of breath intermittently getting worse for few weeks. Chest discomfort. EXAM: CHEST 2 VIEW COMPARISON: Chest x-rays dated 02/05/2015 and 01/19/2015. FINDINGS: Compared to the most recent study of 02/05/2015, there is a new dense opacity at the right lung base which could represent pneumonia or atelectasis with adjacent pleural effusion. There is improved aeration within the right upper lobe compatible with continued improvement of patient's previously described right upper lobe pneumonia. There is mild interstitial prominence bilaterally suggesting edema. Linear atelectasis versus chronic scarring again noted within the left mid lung region. No pneumothorax. Cardiomediastinal silhouette appears stable in size and configuration. No acute osseous abnormality. IMPRESSION: New dense opacity at the right lung base, centered within the right middle lobe, which could represent pneumonia or atelectasis. Aspiration also possible. There is also a right pleural effusion which is probably small. Cardiomegaly with central pulmonary vascular  congestion and mild bilateral interstitial edema suggesting some degree of volume overload/ CHF. Continued improvement of the previously described right upper lobe pneumonia. I suspect that the previous right upper lobe pneumonia has completely resolved and that the linear opacities at the periphery of the right upper lobe are areas of chronic scarring (sequela of the previous pneumonia). Electronically Signed By: Franki Cabot M.D. On:  05/13/2015 13:12    Dg Chest 2 View  05/15/2015   CLINICAL DATA:  Shortness of Breath  EXAM: CHEST  2 VIEW  COMPARISON:  May 13, 2015  FINDINGS: There is slightly less consolidation in the right base. There is a persistent right effusion. There is mild atelectasis in the left mid lower lung zones. There is also mild atelectasis in the right mid lung which is stable. No new opacities apparent. Heart is mildly enlarged with pulmonary vascularity within normal limits. No adenopathy. There is degenerative change in the thoracic spine.  IMPRESSION: Slightly less consolidation right base. Right effusion is not felt to be appreciably changed. Patchy atelectasis bilaterally is stable. There is stable cardiomegaly. No new opacity.   Electronically Signed   By: Lowella Grip III M.D.   On: 05/15/2015 08:08   Echo 9/14 Study Conclusions - Left ventricle: The cavity size was mildly dilated. Wall thickness was increased in a pattern of moderate LVH. The estimated ejection fraction was 55%. Doppler parameters are consistent with abnormal left ventricular relaxation (grade 1 diastolic dysfunction). - Left atrium: The atrium was moderately dilated. - Atrial septum: No defect or patent foramen ovale was identified  Nicolette Bang, DO 05/15/2015, 7:22 AM PGY-1, [place] Intern pager: [phone], text pages welcome

## 2015-05-15 NOTE — Progress Notes (Signed)
Patient Name: Jerry Cantrell Date of Encounter: 05/15/2015  Principal Problem:   Chest pain at rest Active Problems:   Hyperlipemia   Hypertension   Obstructive sleep apnea   Decreased peripheral vision of left eye   Respiratory distress   CHF exacerbation   Elevated troponin   Primary Cardiologist: Dr Harrington Challenger  Patient Profile: 49 yo male w/ hx CAP & resp failure, sepsis, ARF, acute encephalopathy 2015. Hx HTN, OSA, DM2, D-CHF, HL, tob use. Admitted 09/12 w/ sharp chest pain, hx cocaine use, volume overload, periph neuropathy.  SUBJECTIVE: Breathing better, not at baseline.  OBJECTIVE Filed Vitals:   05/15/15 0500 05/15/15 0523 05/15/15 0800 05/15/15 0958  BP:    192/97  Pulse:      Temp:  97.9 F (36.6 C) 98.6 F (37 C)   TempSrc:  Oral Oral   Resp:      Height:      Weight: 306 lb (138.8 kg)     SpO2:        Intake/Output Summary (Last 24 hours) at 05/15/15 1155 Last data filed at 05/15/15 0526  Gross per 24 hour  Intake  467.6 ml  Output   2150 ml  Net -1682.4 ml   Filed Weights   05/13/15 1815 05/14/15 0500 05/15/15 0500  Weight: 309 lb 11.9 oz (140.5 kg) 308 lb 13.8 oz (140.1 kg) 306 lb (138.8 kg)    PHYSICAL EXAM General: Well developed, well nourished, male in no acute distress. Head: Normocephalic, atraumatic.  Neck: Supple without bruits, JVD difficult to assess 2nd habitus. Lungs:  Resp regular and unlabored, decreased BS bases w/ rales. Heart: RRR, S1, S2, no S3, S4, or murmur; no rub. Abdomen: Soft, non-tender, non-distended, BS + x 4.  Extremities: No clubbing, cyanosis, 2 + edema.  Neuro: Alert and oriented X 3. Moves all extremities spontaneously. Psych: Normal affect.  LABS: CBC:  Recent Labs  05/14/15 0246 05/15/15 1000  WBC 8.7 8.4  HGB 16.0 15.7  HCT 52.5* 52.5*  MCV 97.8 99.2  PLT 159 [id number]*   Basic Metabolic Panel:  Recent Labs  05/14/15 0246 05/15/15 1000  NA 137 141  K 4.8 4.1  CL 99* 97*  CO2 32 36*  GLUCOSE  420* 178*  BUN 32* 31*  CREATININE 2.57* 2.34*  CALCIUM 8.4* 8.5*   Liver Function Tests:  Recent Labs  05/14/15 0246  AST 14*  ALT 15*  ALKPHOS 92  BILITOT 0.4  PROT 5.8*  ALBUMIN 2.4*   Cardiac Enzymes:  Recent Labs  05/13/15 1610 05/13/15 2203 05/14/15 0246  TROPONINI 0.23* 0.17* 0.14*    Recent Labs  05/13/15 1150  TROPIPOC 0.24*   BNP:  B NATRIURETIC PEPTIDE  Date/Time Value Ref Range Status  05/13/2015 11:42 AM 176.5* 0.0 - 100.0 pg/mL Final  01/18/2015 12:00 AM 279.0* 0.0 - 100.0 pg/mL Final   TELE:  SR, No sig ectopy      Radiology/Studies: Dg Chest 2 View  05/15/2015   CLINICAL DATA:  Shortness of Breath  EXAM: CHEST  2 VIEW  COMPARISON:  May 13, 2015  FINDINGS: There is slightly less consolidation in the right base. There is a persistent right effusion. There is mild atelectasis in the left mid lower lung zones. There is also mild atelectasis in the right mid lung which is stable. No new opacities apparent. Heart is mildly enlarged with pulmonary vascularity within normal limits. No adenopathy. There is degenerative change in the thoracic spine.  IMPRESSION: Slightly  less consolidation right base. Right effusion is not felt to be appreciably changed. Patchy atelectasis bilaterally is stable. There is stable cardiomegaly. No new opacity.   Electronically Signed   By: Lowella Grip III M.D.   On: 05/15/2015 08:08   Dg Chest 2 View  05/13/2015   CLINICAL DATA:  Shortness of breath intermittently getting worse for few weeks. Chest discomfort.  EXAM: CHEST  2 VIEW  COMPARISON:  Chest x-rays dated 02/05/2015 and 01/19/2015.  FINDINGS: Compared to the most recent study of 02/05/2015, there is a new dense opacity at the right lung base which could represent pneumonia or atelectasis with adjacent pleural effusion. There is improved aeration within the right upper lobe compatible with continued improvement of patient's previously described right upper lobe  pneumonia.  There is mild interstitial prominence bilaterally suggesting edema. Linear atelectasis versus chronic scarring again noted within the left mid lung region. No pneumothorax. Cardiomediastinal silhouette appears stable in size and configuration. No acute osseous abnormality.  IMPRESSION: New dense opacity at the right lung base, centered within the right middle lobe, which could represent pneumonia or atelectasis. Aspiration also possible. There is also a right pleural effusion which is probably small.  Cardiomegaly with central pulmonary vascular congestion and mild bilateral interstitial edema suggesting some degree of volume overload/ CHF.  Continued improvement of the previously described right upper lobe pneumonia. I suspect that the previous right upper lobe pneumonia has completely resolved and that the linear opacities at the periphery of the right upper lobe are areas of chronic scarring (sequela of the previous pneumonia).   Electronically Signed   By: Franki Cabot M.D.   On: 05/13/2015 13:12   Current Medications:  . [START ON 05/16/2015] amLODipine  10 mg Oral Daily  . antiseptic oral rinse  7 mL Mouth Rinse BID  . aspirin  325 mg Oral Daily  . atorvastatin  80 mg Oral q1800  . carvedilol  25 mg Oral BID WC  . cloNIDine  0.2 mg Oral TID  . enoxaparin (LOVENOX) injection  70 mg Subcutaneous Q24H  . fluticasone  1-2 spray Each Nare BID  . folic acid  1 mg Oral Daily  . furosemide  80 mg Intravenous Q12H  . gemfibrozil  600 mg Oral BID AC  . Influenza vac split quadrivalent PF  0.5 mL Intramuscular Tomorrow-1000  . insulin aspart  0-20 Units Subcutaneous TID WC  . insulin aspart  0-5 Units Subcutaneous QHS  . insulin glargine  50 Units Subcutaneous Daily  . multivitamin with minerals  1 tablet Oral Daily  . omega-3 acid ethyl esters  2 g Oral BID  . sodium chloride  3 mL Intravenous Q12H  . thiamine  100 mg Oral Daily      ASSESSMENT AND PLAN: Principal Problem:   Chest  pain at rest - ez minimal elevation, do not think primary coronary vessel closure - echo ordered. - CRF reduction encouraged w/ lifestyle modifications - on ASA, BB, statin - MD advise if he needs MV and if should be inpt or outpt    CHF exacerbation - acute on chronic CHF, echo Pending, EF prev nl - Continue IV Lasix, amount of volume overload unclear, he does not weigh - however, was 290 lbs in June 2016 - BUN/Cr increasing, not on ACE/ARB - had 480 cc before 10 am today, suspect po intake is high  Otherwise, per IM Active Problems:   Hyperlipemia   Hypertension   Obstructive sleep apnea  Decreased peripheral vision of left eye   Respiratory distress  49 yo male w/ hx CAP & resp failure, sepsis, ARF, acute encephalopathy 2015. Hx HTN, OSA, DM2, D-CHF, HL, tob use. Admitted 09/12 w/ sharp chest pain, hx cocaine use, volume overload, periph neuropathy. Troponin minimally elevated in the settings of sepsis and acute renal failure, ECG unchanged from June 2016. Prior echo LVEF 60%, repeat pending. I would treat for underlying pneumonia and schedule an outpatient stress test once her primary problem is resolved.  Continue iv lasix for acute on chronic diastolic CHF, we will monitor Crea closely. - 2.3 L in the last 24 hours, now 308 lbs, baseline 290 lbs. Add amlodipine for hypertension, also would help with pulmonary hypertension.   Dorothy Spark 05/15/2015

## 2015-05-15 NOTE — Progress Notes (Signed)
Patient ID: Jerry Cantrell, male   DOB: 1966/03/04, 49 y.o.   MRN: [id number]  [facility] Progress Note   Assessment/ Plan:   1. Acute renal failure on chronic kidney disease stage IIIB: suspected to be hemodynamically mediated by CHF exacerbation/fluid shifts from nephrotic syndrome with interstitial excess and intravascular depletion. Awaiting labs from today to assess for any changes with ongoing diuretic therapy-he has had good response to diuretics overnight and his neck negative. We discussed fluid restriction while he is here at the hospital. Clinically, without dialysis needs at this time 2. Nephrotic range proteinuria/anasarca/volume overload: Previously done workup consistent with diabetic kidney disease-hepatitis testing from yesterday was negative, antinuclear antibody pending but pretest suspicion is low for lupus. Adjusting diuretic therapy with ongoing sodium restriction/fluid restriction for volume loading. 3. Chest pain: Ongoing cardiology evaluation and cycling of cardiac enzymes for possible ACS. Echocardiogram done earlier today shows preserved ejection fraction with grade 1 diastolic dysfunction. 4. Hypertension: Currently on amlodipine 5 mg daily, carvedilol 25 mg twice a day, clonidine 0.2 mg 3 times a day and intravenous furosemide 80 mg twice a day. Also on PRN hydralazine for blood pressure control. 5. Tobacco abuse/alcohol abuse: On thiamine/multivitamin and withdrawal precautions  Subjective:   Reports to be feeling somewhat better and admits to drinking a lot of soda overnight    Objective:   BP 192/97 mmHg  Pulse 67  Temp(Src) 97.9 F (36.6 C) (Oral)  Resp 23  Ht 6\' 1"  (1.854 m)  Wt 138.8 kg (306 lb)  BMI 40.38 kg/m2  SpO2 95%  Intake/Output Summary (Last 24 hours) at 05/15/15 1000 Last data filed at 05/15/15 [id number]  Gross per 24 hour  Intake  551.6 ml  Output   2150 ml  Net -1598.4 ml   Weight change: -0.624 kg (-1 lb 6 oz)  Physical  Exam: Gen: Comfortable sitting up on the side of his bed CVS: Pulse regular in rate and rhythm, S1 and S2 normal Resp: Decreased breath sounds over bases-occasional expiratory wheeze Abd: Soft, obese, nontender Ext: 2-3+ anasarca  Imaging: Dg Chest 2 View  05/15/2015   CLINICAL DATA:  Shortness of Breath  EXAM: CHEST  2 VIEW  COMPARISON:  May 13, 2015  FINDINGS: There is slightly less consolidation in the right base. There is a persistent right effusion. There is mild atelectasis in the left mid lower lung zones. There is also mild atelectasis in the right mid lung which is stable. No new opacities apparent. Heart is mildly enlarged with pulmonary vascularity within normal limits. No adenopathy. There is degenerative change in the thoracic spine.  IMPRESSION: Slightly less consolidation right base. Right effusion is not felt to be appreciably changed. Patchy atelectasis bilaterally is stable. There is stable cardiomegaly. No new opacity.   Electronically Signed   By: Lowella Grip III M.D.   On: 05/15/2015 08:08   Dg Chest 2 View  05/13/2015   CLINICAL DATA:  Shortness of breath intermittently getting worse for few weeks. Chest discomfort.  EXAM: CHEST  2 VIEW  COMPARISON:  Chest x-rays dated 02/05/2015 and 01/19/2015.  FINDINGS: Compared to the most recent study of 02/05/2015, there is a new dense opacity at the right lung base which could represent pneumonia or atelectasis with adjacent pleural effusion. There is improved aeration within the right upper lobe compatible with continued improvement of patient's previously described right upper lobe pneumonia.  There is mild interstitial prominence bilaterally suggesting edema. Linear atelectasis versus chronic scarring again noted within  the left mid lung region. No pneumothorax. Cardiomediastinal silhouette appears stable in size and configuration. No acute osseous abnormality.  IMPRESSION: New dense opacity at the right lung base, centered  within the right middle lobe, which could represent pneumonia or atelectasis. Aspiration also possible. There is also a right pleural effusion which is probably small.  Cardiomegaly with central pulmonary vascular congestion and mild bilateral interstitial edema suggesting some degree of volume overload/ CHF.  Continued improvement of the previously described right upper lobe pneumonia. I suspect that the previous right upper lobe pneumonia has completely resolved and that the linear opacities at the periphery of the right upper lobe are areas of chronic scarring (sequela of the previous pneumonia).   Electronically Signed   By: Franki Cabot M.D.   On: 05/13/2015 13:12    Labs: BMET  Recent Labs Lab 05/13/15 1142 05/14/15 0246  NA 143 137  K 3.9 4.8  CL 105 99*  CO2 30 32  GLUCOSE 238* 420*  BUN 29* 32*  CREATININE 2.35* 2.57*  CALCIUM 8.6* 8.4*   CBC  Recent Labs Lab 05/13/15 1142 05/14/15 0246  WBC 6.9 8.7  HGB 15.8 16.0  HCT 50.6 52.5*  MCV 97.1 97.8  PLT 149* 159    Medications:    . amLODipine  5 mg Oral Daily  . antiseptic oral rinse  7 mL Mouth Rinse BID  . aspirin  325 mg Oral Daily  . atorvastatin  80 mg Oral q1800  . carvedilol  25 mg Oral BID WC  . cloNIDine  0.2 mg Oral TID  . enoxaparin (LOVENOX) injection  70 mg Subcutaneous Q24H  . fluticasone  1-2 spray Each Nare BID  . folic acid  1 mg Oral Daily  . furosemide  80 mg Intravenous Q12H  . gemfibrozil  600 mg Oral BID AC  . Influenza vac split quadrivalent PF  0.5 mL Intramuscular Tomorrow-1000  . insulin aspart  0-20 Units Subcutaneous TID WC  . insulin aspart  0-5 Units Subcutaneous QHS  . insulin glargine  50 Units Subcutaneous Daily  . multivitamin with minerals  1 tablet Oral Daily  . omega-3 acid ethyl esters  2 g Oral BID  . sodium chloride  3 mL Intravenous Q12H  . thiamine  100 mg Oral Daily   Or  . thiamine  100 mg Intravenous Daily   Elmarie Shiley, MD 05/15/2015, 10:00 AM

## 2015-05-15 NOTE — Progress Notes (Signed)
   05/15/15 1930  Vitals  Temp 98.4 F (36.9 C)  Temp Source Oral  BP (!) 176/87 mmHg  MAP (mmHg) 108  BP Location Left Arm  BP Method Automatic  Patient Position (if appropriate) Lying  Pulse Rate 66  Pulse Rate Source Monitor  ECG Heart Rate 72  Cardiac Rhythm Junctional rhythm  Resp (!) 26  5 mg prn hydralazine given per md order will continue to monitor

## 2015-05-16 DIAGNOSIS — J90 Pleural effusion, not elsewhere classified: Secondary | ICD-10-CM

## 2015-05-16 LAB — CBC
HEMATOCRIT: 49.2 % (ref 39.0–52.0)
Hemoglobin: 14.9 g/dL (ref 13.0–17.0)
MCH: 29.3 pg (ref 26.0–34.0)
MCHC: 30.3 g/dL (ref 30.0–36.0)
MCV: 96.9 fL (ref 78.0–100.0)
PLATELETS: 154 10*3/uL (ref 150–400)
RBC: 5.08 MIL/uL (ref 4.22–5.81)
RDW: 15.7 % — AB (ref 11.5–15.5)
WBC: 6.9 10*3/uL (ref 4.0–10.5)

## 2015-05-16 LAB — GLUCOSE, CAPILLARY
GLUCOSE-CAPILLARY: 113 mg/dL — AB (ref 65–99)
GLUCOSE-CAPILLARY: 254 mg/dL — AB (ref 65–99)
Glucose-Capillary: 234 mg/dL — ABNORMAL HIGH (ref 65–99)
Glucose-Capillary: 281 mg/dL — ABNORMAL HIGH (ref 65–99)

## 2015-05-16 LAB — BASIC METABOLIC PANEL
ANION GAP: 6 (ref 5–15)
BUN: 28 mg/dL — ABNORMAL HIGH (ref 6–20)
CALCIUM: 8.5 mg/dL — AB (ref 8.9–10.3)
CO2: 38 mmol/L — ABNORMAL HIGH (ref 22–32)
Chloride: 99 mmol/L — ABNORMAL LOW (ref 101–111)
Creatinine, Ser: 2.03 mg/dL — ABNORMAL HIGH (ref 0.61–1.24)
GFR, EST AFRICAN AMERICAN: 43 mL/min — AB (ref >60)
GFR, EST NON AFRICAN AMERICAN: 37 mL/min — AB (ref >60)
Glucose, Bld: 134 mg/dL — ABNORMAL HIGH (ref 65–99)
Potassium: 3.2 mmol/L — ABNORMAL LOW (ref 3.5–5.1)
Sodium: 143 mmol/L (ref 135–145)

## 2015-05-16 MED ORDER — FUROSEMIDE 10 MG/ML IJ SOLN
120.0000 mg | Freq: Three times a day (TID) | INTRAVENOUS | Status: AC
  Administered 2015-05-16 – 2015-05-17 (×2): 120 mg via INTRAVENOUS
  Filled 2015-05-16 (×5): qty 12

## 2015-05-16 MED ORDER — FENOFIBRATE 160 MG PO TABS
160.0000 mg | ORAL_TABLET | Freq: Every day | ORAL | Status: DC
  Administered 2015-05-17 – 2015-05-18 (×2): 160 mg via ORAL
  Filled 2015-05-16 (×2): qty 1

## 2015-05-16 MED ORDER — ISOSORBIDE MONONITRATE ER 30 MG PO TB24
30.0000 mg | ORAL_TABLET | Freq: Every day | ORAL | Status: DC
  Administered 2015-05-16 – 2015-05-18 (×3): 30 mg via ORAL
  Filled 2015-05-16 (×3): qty 1

## 2015-05-16 MED ORDER — AMLODIPINE BESYLATE 10 MG PO TABS
10.0000 mg | ORAL_TABLET | Freq: Every day | ORAL | Status: DC
  Administered 2015-05-16 – 2015-05-18 (×3): 10 mg via ORAL
  Filled 2015-05-16 (×3): qty 1

## 2015-05-16 MED ORDER — POTASSIUM CHLORIDE CRYS ER 20 MEQ PO TBCR
40.0000 meq | EXTENDED_RELEASE_TABLET | Freq: Two times a day (BID) | ORAL | Status: AC
  Administered 2015-05-16 (×2): 40 meq via ORAL
  Filled 2015-05-16 (×2): qty 2

## 2015-05-16 MED ORDER — FUROSEMIDE 10 MG/ML IJ SOLN: 80.0000 mg | Freq: Two times a day (BID) | INTRAMUSCULAR | Status: DC

## 2015-05-16 MED ORDER — HYDRALAZINE HCL 25 MG PO TABS
25.0000 mg | ORAL_TABLET | Freq: Three times a day (TID) | ORAL | Status: DC
  Administered 2015-05-16 – 2015-05-17 (×3): 25 mg via ORAL
  Filled 2015-05-16 (×5): qty 1

## 2015-05-16 MED ORDER — FUROSEMIDE 10 MG/ML IJ SOLN
80.0000 mg | Freq: Three times a day (TID) | INTRAMUSCULAR | Status: AC
  Administered 2015-05-17 – 2015-05-18 (×3): 80 mg via INTRAVENOUS
  Filled 2015-05-16 (×3): qty 8

## 2015-05-16 NOTE — Progress Notes (Signed)
Family Medicine Teaching Service Daily Progress Note Intern Pager: [phone]  Patient name: Jerry Cantrell Medical record number: [id number] Date of birth: 06/22/1966 Age: 49 y.o. Gender: male  Primary Care Provider: Benito Mccreedy, MD Consultants: Cardiology, Nephrology  Code Status: Full  Pt Overview and Major Events to Date:  9/12: Admitted for ACS r/o and possible CHF exacerbation  9/14: Echo: LVEF [id number], Grade I Diastolic Dysfuction [id number]: Increased Lasix dose   Assessment and Plan: Jerry Cantrell is a 49 y.o. male presenting with one month of progressive dyspnea and lower extremity edema, as well as two days of chest tightness. PMH is significant for diastolic HFpEF, HTN, HLD, T2DM, CKD Stage III, OSA, and tobacco use.  CHF Exacerbation, ACS r/o: Most recent echo (01/18/15) with EF 50-55% and grade 1 diastolic dysfunction. Most likely diagnosis in this patient with history of HFpEF and progressive shortness of breath and edema is CHF exacerbation. This diagnosis is further supported by CXR notable for changes suggestive of volume overload and an elevated BNP. Of note, the patient has multiple risk factors for CAD including obesity, HLD, and T2DM and admission EKG was concerning for possible septal infarct with mild troponin elevation. Also of note, patient with recent admission for community acquired pneumonia complicated by sepsis, acute encephalopathy, and acute renal failure. CXR on admission shows resolution of previous RUL pneumonia with subsequent scarring, but is notable for a new dense opacity at the right lung base that could represent pneumonia or atelectasis. Repeat CXR demonstrated slightly less consolidation at R lung base. On presentation, he is without leukocytosis and has been afebrile; however, pneumonia remains on the differential diagnosis. - Cardiology following, appreciate recommendations. - Telemetry -Nephrology following, appreciate recs -increase lasix to 120mg  TID for 3  doses>> 80mg  TID x3 doses >> 80 mg BID; may need metolazone if urine output not impressive; no acute dialysis needed at present  - Strict I&Os: net output of 3.8 L  - echo: LVEF 55%, mild LVH, Grade I diastolic dysfunction  - Fasting lipid panel for risk stratification: Cholesterol 191, HDl 37, LCl 126 - Continue to counsel regarding use of tobacco and cocaine - O2 2L [place]; sating 95-97% -10 year risk of CVD 28.0% >> increase Lipitor to 80 mg daily  -daily weights: 309 on admission >>308 >> 306 (3 lb weight loss total) -CT chest: Small to moderate right pleural effusion. Scattered atelectasis greatest in R lower lobe. Bilateral pulmonary nodules, some stable, 5 mm nodule left lower lobe not previously visualized >> will need repeat chest CT in 6 months d/t smoking history     Hypertension: Patient on carvedilol 25 mg PO BID, clonidine 0.2 mg PO BID, hydralazine 10 mg PO TID, metolazone 5 mg PO daily, and verapamil 120 mg ER qhs. - Continue carvedilol 25 mg PO BID and clonidine 0.2 mg PO BID - Hydralazine 5 mg IV PRN for systolics [id number] or diastolics [id number] - increase amlodipine to 10 mg daily  OSA: During previous admissions, patient became severely hypoxic while asleep. He was evaluated recently on an outpatient basis and deemed appropriate for CPAP, but never had this delivered. - CPAP during admission  AKI on CKD Stage III: Baseline Cr 1.3-1.5; Cr 2.35 on admission with BUN:Cr <20:1. Likely secondary to volume overload. Followed by Nephrologist, Dr. Justin Mend. Patient reports that he was recently placed on the transplant list. - IV Lasix as above - Daily BMP, CTM  -Cr 2.03 (9/15) -UA protein >300, Albumin 2.9 and UPC 6.07; consider consulting nephrology  -  nephro following; appreciate recs >>baseline CKD with nephrotic range proteinuria from poorly controlled DM, volume shifts associated with nephrotic syndrome and possibly CHF exacerbation  -lupus workup negative   Type II Diabetes Mellitus: Most  recent A1c 8.2 (05/02/15). Currently on Lantus 35 U qhs and 40 U qAM. Per note by PCP, blood glucose suboptimally controlled and he was instructed to increase to Lantus 50 U BID (05/02/15); however, he was concerned regarding AM lows and did not increase his Lantus dose as instructed. - Moderate SSI - Lantus 50 U qAM  Depression: Stable on Elavil. - Hold home Elavil 50 mg PO qhs while in SDU  Tobacco Use: Currently attempting cessation with nicotine patches. - Nicotine patch prn during admission - Continue encouragement of cessation  FEN/GI: NPO Prophylaxis: Lovenox 70 mg q 24hr  Disposition: home pending improvement of acute hypoxemic respiratory failure   Subjective:  Attempted to wean to 1 L O2 yesterday but sats dropped when he feel asleep. Currently breathing comfortably on 2 L of O2. Continues to be tearful about son leaving for prison.   Objective: Temp:  [98.2 F (36.8 C)-98.4 F (36.9 C)] 98.4 F (36.9 C) (09/15 0335) Pulse Rate:  [66-71] 67 (09/15 0600) Resp:  [11-26] 11 (09/15 0600) BP: (146-192)/(86-102) 188/88 mmHg (09/15 0600) SpO2:  [94 %-97 %] 96 % (09/15 0600) Weight:  [306 lb 10.6 oz (139.1 kg)] 306 lb 10.6 oz (139.1 kg) (09/15 0335) Physical Exam: General: obese male  Cardiovascular: RRR. No murmurs appreciated  Respiratory: Diminished breath sounds at bases. Occasional wheeze.  Abdomen: obese, +bs, soft, NT and mildly edematous  Extremities: 2+ pitting edema of LE to thighs, edema of hands bilaterally  Laboratory:  Recent Labs Lab 05/14/15 0246 05/15/15 1000 05/16/15 0335  WBC 8.7 8.4 6.9  HGB 16.0 15.7 14.9  HCT 52.5* 52.5* 49.2  PLT 159 145* 154    Recent Labs Lab 05/14/15 0246 05/15/15 1000 05/16/15 0335  NA 137 141 143  K 4.8 4.1 3.2*  CL 99* 97* 99*  CO2 32 36* 38*  BUN 32* 31* 28*  CREATININE 2.57* 2.34* 2.03*  CALCIUM 8.4* 8.5* 8.5*  PROT 5.8*  --   --   BILITOT 0.4  --   --   ALKPHOS 92  --   --   ALT 15*  --   --   AST 14*  --    --   GLUCOSE 420* 178* 134*    Imaging/Diagnostic Tests: ECHO May 2016 Left ventricle: Abnormal septal motion The cavity size was mildly dilated. There was mild concentric hypertrophy. Systolic function was normal. The estimated ejection fraction was in the range of 55% to 60%. Wall motion was normal; there were no regional wall motion abnormalities. - Left atrium: The atrium was mildly dilated. - Atrial septum: There was increased thickness of the septum, consistent with lipomatous hypertrophy. No defect or patent foramen ovale was identified. - Pulmonary arteries: PA peak pressure: 41 mm Hg (S).   EKG: Monday May 13 2015 11:44:07 EDT Ventricular Rate: 68 PR Interval: 184 QRS Duration: 122 QT Interval: 438 QTC Calculation: 465 R Axis:-48 Text Interpretation: Normal sinus rhythm. Left axis deviation. Septal infarct, age undetermined. Abnormal ECG. No significant change since last tracing. Confirmed by Carmin Muskrat MD [phone]) on 05/13/2015 12:06:04 PM  Dg Chest 2 View  05/13/2015 CLINICAL DATA: Shortness of breath intermittently getting worse for few weeks. Chest discomfort. EXAM: CHEST 2 VIEW COMPARISON: Chest x-rays dated 02/05/2015 and 01/19/2015. FINDINGS: Compared to  the most recent study of 02/05/2015, there is a new dense opacity at the right lung base which could represent pneumonia or atelectasis with adjacent pleural effusion. There is improved aeration within the right upper lobe compatible with continued improvement of patient's previously described right upper lobe pneumonia. There is mild interstitial prominence bilaterally suggesting edema. Linear atelectasis versus chronic scarring again noted within the left mid lung region. No pneumothorax. Cardiomediastinal silhouette appears stable in size and configuration. No acute osseous abnormality. IMPRESSION: New dense opacity at the right lung base, centered within the right middle lobe, which  could represent pneumonia or atelectasis. Aspiration also possible. There is also a right pleural effusion which is probably small. Cardiomegaly with central pulmonary vascular congestion and mild bilateral interstitial edema suggesting some degree of volume overload/ CHF. Continued improvement of the previously described right upper lobe pneumonia. I suspect that the previous right upper lobe pneumonia has completely resolved and that the linear opacities at the periphery of the right upper lobe are areas of chronic scarring (sequela of the previous pneumonia). Electronically Signed By: Franki Cabot M.D. On: 05/13/2015 13:12    Ct Chest Wo Contrast  05/15/2015   CLINICAL DATA:  CHF, pleural effusion, hypertension, hyperlipidemia, diabetes mellitus with nephropathy, acute diastolic heart failure/CHF  EXAM: CT CHEST WITHOUT CONTRAST  TECHNIQUE: Multidetector CT imaging of the chest was performed following the standard protocol without IV contrast. Sagittal and coronal MPR images reconstructed from axial data set.  COMPARISON:  03/05/2010  FINDINGS: Aorta normal caliber.  Calcified RIGHT hilar and subcarinal lymph nodes.  Additional noncalcified mediastinal nodes largest RIGHT peritracheal 11 mm diameter image 22 and LEFT paratracheal 12 mm diameter.  Visualized upper abdomen unremarkable.  Heart enlarged.  Small to moderate RIGHT pleural effusion.  Calcified granulomata RIGHT upper and RIGHT lower lobes.  Atelectasis of RIGHT lower lobe.  5 mm slightly ill-defined nodule inferior RIGHT upper lobe image 26 unchanged.  Posterior RIGHT upper lobe nodule 4 mm diameter image 16 unchanged.  Mild compressive atelectasis at posterior aspects of the RIGHT middle and RIGHT upper lobes.  Focal area of slightly nodular scarring antral lateral RIGHT middle lobe base image 40 unchanged.  Dependent atelectasis LEFT lower lobe.  5 mm LEFT lower lobe nodule image 37 not definitely seen previously.  No acute infiltrate  or pneumothorax.  Scattered endplate spur formation throughout mid to inferior thoracic spine.  IMPRESSION: Small to moderate RIGHT pleural effusion.  Old granulomatous disease.  Scattered atelectasis greatest in RIGHT lower lobe.  BILATERAL pulmonary nodules, some stable, 5 mm nodule LEFT lower lobe not definitely previously visualized; recommendation below.  If the patient is at high risk for bronchogenic carcinoma, follow-up chest CT at 6-12 months is recommended. If the patient is at low risk for bronchogenic carcinoma, follow-up chest CT at 12 months is recommended. This recommendation follows the consensus statement: Guidelines for Management of Small Pulmonary Nodules Detected on CT Scans: A Statement from the [place] as published in Radiology 2005;237:395-400.   Electronically Signed   By: Lavonia Dana M.D.   On: 05/15/2015 13:40   Echo 9/14 Study Conclusions - Left ventricle: The cavity size was mildly dilated. Wall thickness was increased in a pattern of moderate LVH. The estimated ejection fraction was 55%. Doppler parameters are consistent with abnormal left ventricular relaxation (grade 1 diastolic dysfunction). - Left atrium: The atrium was moderately dilated. - Atrial septum: No defect or patent foramen ovale was identified  Nicolette Bang, DO 05/16/2015, 9:41 AM  PGY-1, [place] Intern pager: [phone], text pages welcome

## 2015-05-16 NOTE — Progress Notes (Signed)
Patient ID: Jerry Cantrell, male   DOB: 1966-05-23, 49 y.o.   MRN: [id number]  [facility] Progress Note   Assessment/ Plan:   1. Acute renal failure on chronic kidney disease stage IIIB: suspected to be hemodynamically mediated by CHF exacerbation/fluid shifts from nephrotic syndrome with interstitial excess and intravascular depletion. As expected in this patient with CHF exacerbation-diuretic therapy improving his symptoms as well as his renal function. Excellent urinary output overnight on current dose of Lasix-although patient reports minimal clinical improvement and his weight is unchanged-paradoxical because he appears to be net -4L on input/output; increase furosemide to 120 mg 3 times a day for 3 doses and then dropped dose down to 80 mg 3 times a day for 3 doses and then 80 mg twice a day to try and get a better response-may need metolazone if urine output not impressive. No acute dialysis needs at this time. 2. Nephrotic range proteinuria/anasarca/volume overload: Previously done workup consistent with diabetic kidney disease-serology is negative for lupus and no evidence of chronic hepatitis. 3. Chest pain: Ongoing cardiology evaluation and cycling of cardiac enzymes for possible ACS. Echocardiogram shows normal/preserved EF with diastolic dysfunction. 4. Hypertension: Currently on amlodipine 5 mg daily, carvedilol 25 mg twice a day, clonidine 0.2 mg 3 times a day and intravenous furosemide 80 mg twice a day. Also on PRN hydralazine for blood pressure control. 5. Tobacco abuse/alcohol abuse: On thiamine/multivitamin and withdrawal precautions 6. Hypokalemia: We'll replace via oral route-K Dur 40 mEq twice a day 2 doses  Subjective:    Reports he continues to experience sensation of swelling all over-weight unchanged but net -4 L    Objective:   BP 188/88 mmHg  Pulse 67  Temp(Src) 98.4 F (36.9 C) (Oral)  Resp 11  Ht 6\' 1"  (1.854 m)  Wt 139.1 kg (306 lb 10.6 oz)  BMI  40.47 kg/m2  SpO2 96%  Intake/Output Summary (Last 24 hours) at 05/16/15 0912 Last data filed at 05/16/15 0700  Gross per 24 hour  Intake    360 ml  Output   4594 ml  Net  -4234 ml   Weight change: 0.3 kg (10.6 oz)  Physical Exam: Gen: Appears to be somewhat comfortable sitting in his recliner-talking on cell phone CVS: Pulse regular in rate and rhythm, S1 and S2 normal Resp: Decreased breath sounds over bases-occasional expiratory wheeze Abd: Soft, obese, nontender Ext: 2-3+ anasarca  Imaging: Dg Chest 2 View  05/15/2015   CLINICAL DATA:  Shortness of Breath  EXAM: CHEST  2 VIEW  COMPARISON:  May 13, 2015  FINDINGS: There is slightly less consolidation in the right base. There is a persistent right effusion. There is mild atelectasis in the left mid lower lung zones. There is also mild atelectasis in the right mid lung which is stable. No new opacities apparent. Heart is mildly enlarged with pulmonary vascularity within normal limits. No adenopathy. There is degenerative change in the thoracic spine.  IMPRESSION: Slightly less consolidation right base. Right effusion is not felt to be appreciably changed. Patchy atelectasis bilaterally is stable. There is stable cardiomegaly. No new opacity.   Electronically Signed   By: Lowella Grip III M.D.   On: 05/15/2015 08:08   Ct Chest Wo Contrast  05/15/2015   CLINICAL DATA:  CHF, pleural effusion, hypertension, hyperlipidemia, diabetes mellitus with nephropathy, acute diastolic heart failure/CHF  EXAM: CT CHEST WITHOUT CONTRAST  TECHNIQUE: Multidetector CT imaging of the chest was performed following the standard protocol without IV contrast.  Sagittal and coronal MPR images reconstructed from axial data set.  COMPARISON:  03/05/2010  FINDINGS: Aorta normal caliber.  Calcified RIGHT hilar and subcarinal lymph nodes.  Additional noncalcified mediastinal nodes largest RIGHT peritracheal 11 mm diameter image 22 and LEFT paratracheal 12 mm  diameter.  Visualized upper abdomen unremarkable.  Heart enlarged.  Small to moderate RIGHT pleural effusion.  Calcified granulomata RIGHT upper and RIGHT lower lobes.  Atelectasis of RIGHT lower lobe.  5 mm slightly ill-defined nodule inferior RIGHT upper lobe image 26 unchanged.  Posterior RIGHT upper lobe nodule 4 mm diameter image 16 unchanged.  Mild compressive atelectasis at posterior aspects of the RIGHT middle and RIGHT upper lobes.  Focal area of slightly nodular scarring antral lateral RIGHT middle lobe base image 40 unchanged.  Dependent atelectasis LEFT lower lobe.  5 mm LEFT lower lobe nodule image 37 not definitely seen previously.  No acute infiltrate or pneumothorax.  Scattered endplate spur formation throughout mid to inferior thoracic spine.  IMPRESSION: Small to moderate RIGHT pleural effusion.  Old granulomatous disease.  Scattered atelectasis greatest in RIGHT lower lobe.  BILATERAL pulmonary nodules, some stable, 5 mm nodule LEFT lower lobe not definitely previously visualized; recommendation below.  If the patient is at high risk for bronchogenic carcinoma, follow-up chest CT at 6-12 months is recommended. If the patient is at low risk for bronchogenic carcinoma, follow-up chest CT at 12 months is recommended. This recommendation follows the consensus statement: Guidelines for Management of Small Pulmonary Nodules Detected on CT Scans: A Statement from the [place] as published in Radiology 2005;237:395-400.   Electronically Signed   By: Lavonia Dana M.D.   On: 05/15/2015 13:40    Labs: BMET  Recent Labs Lab 05/13/15 1142 05/14/15 0246 05/15/15 1000 05/16/15 0335  NA 143 137 141 143  K 3.9 4.8 4.1 3.2*  CL 105 99* 97* 99*  CO2 30 32 36* 38*  GLUCOSE 238* 420* 178* 134*  BUN 29* 32* 31* 28*  CREATININE 2.35* 2.57* 2.34* 2.03*  CALCIUM 8.6* 8.4* 8.5* 8.5*   CBC  Recent Labs Lab 05/13/15 1142 05/14/15 0246 05/15/15 1000 05/16/15 0335  WBC 6.9 8.7 8.4 6.9   HGB 15.8 16.0 15.7 14.9  HCT 50.6 52.5* 52.5* 49.2  MCV 97.1 97.8 99.2 96.9  PLT 149* 159 145* 154    Medications:    . amLODipine  10 mg Oral Daily  . antiseptic oral rinse  7 mL Mouth Rinse BID  . aspirin  325 mg Oral Daily  . atorvastatin  80 mg Oral q1800  . carvedilol  25 mg Oral BID WC  . cloNIDine  0.2 mg Oral TID  . enoxaparin (LOVENOX) injection  70 mg Subcutaneous Q24H  . fluticasone  1-2 spray Each Nare BID  . folic acid  1 mg Oral Daily  . furosemide  80 mg Intravenous Q12H  . gemfibrozil  600 mg Oral BID AC  . insulin aspart  0-20 Units Subcutaneous TID WC  . insulin aspart  0-5 Units Subcutaneous QHS  . insulin glargine  50 Units Subcutaneous Daily  . multivitamin with minerals  1 tablet Oral Daily  . omega-3 acid ethyl esters  2 g Oral BID  . sodium chloride  3 mL Intravenous Q12H  . thiamine  100 mg Oral Daily   Elmarie Shiley, MD 05/16/2015, 9:12 AM

## 2015-05-16 NOTE — Progress Notes (Signed)
Patient refused CPAP and is advised to call respiratory if he changes his mind.

## 2015-05-16 NOTE — Progress Notes (Signed)
Patient Name: Jerry Cantrell Date of Encounter: 05/16/2015  Primary Cardiologist: Dr Harrington Challenger  Patient Profile: 49 yo male w/ hx CAP & resp failure, sepsis, ARF, acute encephalopathy 2015. Hx HTN, OSA, DM2, D-CHF, HL, tob use. Admitted 09/12 w/ sharp chest pain, hx cocaine use, volume overload, periph neuropathy.  SUBJECTIVE  Breathing improved. No CP or palpitations.   CURRENT MEDS . amLODipine  10 mg Oral Daily  . antiseptic oral rinse  7 mL Mouth Rinse BID  . aspirin  325 mg Oral Daily  . atorvastatin  80 mg Oral q1800  . carvedilol  25 mg Oral BID WC  . cloNIDine  0.2 mg Oral TID  . enoxaparin (LOVENOX) injection  70 mg Subcutaneous Q24H  . [START ON 05/17/2015] fenofibrate  160 mg Oral Daily  . fluticasone  1-2 spray Each Nare BID  . folic acid  1 mg Oral Daily  . furosemide  120 mg Intravenous 3 times per day   Followed by  . [START ON 05/17/2015] furosemide  80 mg Intravenous 3 times per day   Followed by  . [START ON 05/18/2015] furosemide  80 mg Intravenous BID  . insulin aspart  0-20 Units Subcutaneous TID WC  . insulin aspart  0-5 Units Subcutaneous QHS  . insulin glargine  50 Units Subcutaneous Daily  . multivitamin with minerals  1 tablet Oral Daily  . omega-3 acid ethyl esters  2 g Oral BID  . potassium chloride  40 mEq Oral BID  . sodium chloride  3 mL Intravenous Q12H  . thiamine  100 mg Oral Daily    OBJECTIVE  Filed Vitals:   05/16/15 0705 05/16/15 0800 05/16/15 0935 05/16/15 1040  BP: 187/97  155/94 166/97  Pulse: 86 71 79 70  Temp:  98.7 F (37.1 C)  98.1 F (36.7 C)  TempSrc:  Oral  Oral  Resp:   16 19  Height:      Weight:      SpO2: 97% 97% 92% 94%    Intake/Output Summary (Last 24 hours) at 05/16/15 1338 Last data filed at 05/16/15 0945  Gross per 24 hour  Intake    360 ml  Output   5119 ml  Net  -4759 ml   Filed Weights   05/14/15 0500 05/15/15 0500 05/16/15 0335  Weight: 308 lb 13.8 oz (140.1 kg) 306 lb (138.8 kg) 306 lb 10.6 oz (139.1  kg)    PHYSICAL EXAM  General: Pleasant, NAD. Neuro: Alert and oriented X 3. Moves all extremities spontaneously. Psych: Normal affect. HEENT:  Normal  Neck: Supple without bruits or VD difficult to assess 2nd habitus. Lungs:  Resp regular and unlabored. Bibasilar rales with diminished breath sounds.  Heart: RRR no s3, s4, or murmurs. Abdomen: Soft, non-tender, non-distended, BS + x 4.  Extremities: No clubbing, cyanosis. 1+ BL LE  edema. DP/PT/Radials 2+ and equal bilaterally.  Accessory Clinical Findings  CBC  Recent Labs  05/15/15 1000 05/16/15 0335  WBC 8.4 6.9  HGB 15.7 14.9  HCT 52.5* 49.2  MCV 99.2 96.9  PLT 145* [id number]   Basic Metabolic Panel  Recent Labs  05/15/15 1000 05/16/15 0335  NA 141 143  K 4.1 3.2*  CL 97* 99*  CO2 36* 38*  GLUCOSE 178* 134*  BUN 31* 28*  CREATININE 2.34* 2.03*  CALCIUM 8.5* 8.5*   Liver Function Tests  Recent Labs  05/14/15 0246  AST 14*  ALT 15*  ALKPHOS 92  BILITOT 0.4  PROT  5.8*  ALBUMIN 2.4*   No results for input(s): LIPASE, AMYLASE in the last 72 hours. Cardiac Enzymes  Recent Labs  05/13/15 1610 05/13/15 2203 05/14/15 0246  TROPONINI 0.23* 0.17* 0.14*   Fasting Lipid Panel  Recent Labs  05/15/15 0250  CHOL 191  HDL 37*  LDLCALC 126*  TRIG 141  CHOLHDL 5.2    TELE  NSR at rate of 70-80s.  Radiology/Studies  Dg Chest 2 View  05/15/2015   CLINICAL DATA:  Shortness of Breath  EXAM: CHEST  2 VIEW  COMPARISON:  May 13, 2015  FINDINGS: There is slightly less consolidation in the right base. There is a persistent right effusion. There is mild atelectasis in the left mid lower lung zones. There is also mild atelectasis in the right mid lung which is stable. No new opacities apparent. Heart is mildly enlarged with pulmonary vascularity within normal limits. No adenopathy. There is degenerative change in the thoracic spine.  IMPRESSION: Slightly less consolidation right base. Right effusion is not  felt to be appreciably changed. Patchy atelectasis bilaterally is stable. There is stable cardiomegaly. No new opacity.   Electronically Signed     Ct Chest Wo Contrast  05/15/2015   CLINICAL DATA:  CHF, pleural effusion, hypertension, hyperlipidemia, diabetes mellitus with nephropathy, acute diastolic heart failure/CHF   IMPRESSION: Small to moderate RIGHT pleural effusion.  Old granulomatous disease.  Scattered atelectasis greatest in RIGHT lower lobe.  BILATERAL pulmonary nodules, some stable, 5 mm nodule LEFT lower lobe not definitely previously visualized; recommendation below.  If the patient is at high risk for bronchogenic carcinoma, follow-up chest CT at 6-12 months is recommended. If the patient is at low risk for bronchogenic carcinoma, follow-up chest CT at 12 months is recommended. This recommendation follows the consensus statement: Guidelines for Management of Small Pulmonary Nodules Detected on CT Scans: A Statement from the [place] [facility] as published in   Echo 05/15/15 ------------------------------------------------------------------- LV EF: 55% ------------------------------------------------------------------- Indications:   Dyspnea 786.09. ------------------------------------------------------------------- History:  PMH:  Angina pectoris. Congestive heart failure. Risk factors: Obstructive sleep apnea. Elevated troponin. Sepsis. Hypertension. Dyslipidemia. ------------------------------------------------------------------- Study Conclusions  - Left ventricle: The cavity size was mildly dilated. Wall thickness was increased in a pattern of moderate LVH. The estimated ejection fraction was 55%. Doppler parameters are consistent with abnormal left ventricular relaxation (grade 1 diastolic dysfunction). - Left atrium: The atrium was moderately dilated. - Atrial septum: No defect or patent foramen ovale was identified.    ASSESSMENT AND PLAN  1. Acute  on chronic diastolic CHF  - Net I/O -[id number] liter. Weight down 1 lb (307->306). Doubt accuracy. Still volume overloaded on exam. Cr improved 2.03. Continue current diuretics.  - Continue BB - BUN/Cr increasing, not on ACE/ARB--> resume once table - Echo 05/15/15 showed LV EF of 55%, moderate LVH, grade 1 DD, moderate dilated LA  2.  Acute renal failure on chronic kidney disease stage IIIB: - Per nephrology  3. CP with minimaly elevated troponin - In the settings of sepsis and acute renal failure, ECG unchanged from June 2016. Currently on chest pain - Not on abx for possible underlying pneumonia  - Outpatient stress test once acute illness resolved  4. HTN  - started amlodopine yesterday. BP still up. Consider increasing dose.   5. HL - Continue statin  Otherwise, per primary Obstructive sleep apnea Decreased peripheral vision of left eye Respiratory distress  Signed, Bhagat,Bhavinkumar PA-C  The patient was seen, examined and discussed with Bhagat,Bhavinkumar PA-C and I  agree with the above.   49 yo male w/ hx CAP & resp failure, sepsis, ARF, acute encephalopathy 2015. Hx HTN, OSA, DM2, D-CHF, HL, tob use. Admitted 09/12 w/ sharp chest pain, hx cocaine use, volume overload, periph neuropathy. Troponin minimally elevated in the settings of sepsis and acute renal failure, ECG unchanged from June 2016. Prior echo LVEF 60%, repeat unchanged. I would treat for underlying pneumonia and schedule an outpatient stress test once her primary problem is resolved.  Continue iv lasix for acute on chronic diastolic CHF, significant diuresis, 4.5 L in the last 24 hours, still fluid overloaded. Crea is improving. Now 306 lbs, baseline 290 lbs. Added amlodipine for hypertension, I would add hydralazine 25 mg po TID and imdur 30 mg po daily for HTN.  Dorothy Spark 05/16/2015

## 2015-05-16 NOTE — Care Management Note (Signed)
Case Management Note  Patient Details  Name: Kione Schlossberg MRN: [id number] Date of Birth: 1966-08-07  Subjective/Objective:                  Admitted with dyspnea, lower extremity edema, and chest tightness, hx of is CHF, HTN, HLD, DM, CKD , OSA, and tobacco use. Independent with ADL's.   Action/Plan: Return to home when medically stable. CM to f/u with disposition needs.  Expected Discharge Date:                  Expected Discharge Plan:  Home/Self Care  In-House Referral:     Discharge planning Services  CM Consult  Post Acute Care Choice:    Choice offered to:     DME Arranged:    DME Agency:     HH Arranged:    HH Agency:     Status of Service:  In process, will continue to follow  Medicare Important Message Given:    Date Medicare IM Given:    Medicare IM give by:    Date Additional Medicare IM Given:    Additional Medicare Important Message give by:     If discussed at [place] of Stay Meetings, dates discussed:    Additional Comments:      Montine Circle [phone] Denzil Hughes [facility]  Sharin Mons, RN 05/16/2015, 11:29 AM

## 2015-05-16 NOTE — Progress Notes (Signed)
Report called to Dolores Lory, RN on 6N. Patient to be transferred to 6N06 via wheelchair by Vickii Chafe, NT. Belongings sent with patient. No family at bedside.

## 2015-05-16 NOTE — Progress Notes (Signed)
Inpatient Diabetes Program Recommendations  AACE/ADA: New Consensus Statement on Inpatient Glycemic Control (2015)  Target Ranges:  Prepandial:   less than 140 mg/dL      Peak postprandial:   less than 180 mg/dL (1-2 hours)      Critically ill patients:  140 - 180 mg/dL    Results for Jerry Cantrell, TOBIASON (MRN [id number]) as of 05/16/2015 10:12  Ref. Range 05/15/2015 08:09 05/15/2015 12:38 05/15/2015 16:31 05/15/2015 21:38  Glucose-Capillary Latest Ref Range: 65-99 mg/dL 84 143 (H) 239 (H) 319 (H)    Home DM Meds: Lantus 50 units bid (per MD H&P note in chart)  Novolog 10-30 units tid per SSI  Current DM Orders: Lantus 50 units daily  Novolog Resistant SSI (0-20 units) TID AC + HS    MD- Afternoon glucose levels quite elevated. Patient eating 100% of meals.  MD- Please consider the following in-hospital insulin adjustments:   Start Novolog Meal Coverage- Novolog 4 units tid with meals [Use Glycemic Control Order set to order]    Will follow. Wyn Quaker RN, MSN, CDE Diabetes Coordinator Inpatient Glycemic Control Team Team Pager: [phone] (8a-5p)

## 2015-05-16 NOTE — Progress Notes (Signed)
   05/16/15 0335  Vitals  Temp 98.4 F (36.9 C)  Temp Source Oral  BP (!) 186/97 mmHg  MAP (mmHg) 115  BP Location Left Arm  BP Method Automatic  Patient Position (if appropriate) Lying  Pulse Rate 70  Pulse Rate Source Monitor  ECG Heart Rate 70  Cardiac Rhythm NSR  Resp 15  5 mg of hydralazine given per md order  Will continue to monitor

## 2015-05-17 LAB — CBC
HCT: 49.4 % (ref 39.0–52.0)
HEMOGLOBIN: 15.4 g/dL (ref 13.0–17.0)
MCH: 30.1 pg (ref 26.0–34.0)
MCHC: 31.2 g/dL (ref 30.0–36.0)
MCV: 96.7 fL (ref 78.0–100.0)
Platelets: 160 10*3/uL (ref 150–400)
RBC: 5.11 MIL/uL (ref 4.22–5.81)
RDW: 15.6 % — ABNORMAL HIGH (ref 11.5–15.5)
WBC: 7 10*3/uL (ref 4.0–10.5)

## 2015-05-17 LAB — GLUCOSE, CAPILLARY
GLUCOSE-CAPILLARY: 171 mg/dL — AB (ref 65–99)
GLUCOSE-CAPILLARY: 76 mg/dL (ref 65–99)
Glucose-Capillary: 164 mg/dL — ABNORMAL HIGH (ref 65–99)
Glucose-Capillary: 288 mg/dL — ABNORMAL HIGH (ref 65–99)

## 2015-05-17 LAB — BASIC METABOLIC PANEL
Anion gap: 9 (ref 5–15)
BUN: 28 mg/dL — AB (ref 6–20)
CHLORIDE: 99 mmol/L — AB (ref 101–111)
CO2: 34 mmol/L — AB (ref 22–32)
Calcium: 8.8 mg/dL — ABNORMAL LOW (ref 8.9–10.3)
Creatinine, Ser: 2 mg/dL — ABNORMAL HIGH (ref 0.61–1.24)
GFR calc Af Amer: 43 mL/min — ABNORMAL LOW (ref >60)
GFR calc non Af Amer: 37 mL/min — ABNORMAL LOW (ref >60)
GLUCOSE: 173 mg/dL — AB (ref 65–99)
POTASSIUM: 3.8 mmol/L (ref 3.5–5.1)
Sodium: 142 mmol/L (ref 135–145)

## 2015-05-17 MED ORDER — HYDRALAZINE HCL 50 MG PO TABS
50.0000 mg | ORAL_TABLET | Freq: Three times a day (TID) | ORAL | Status: DC
  Administered 2015-05-17 – 2015-05-18 (×2): 50 mg via ORAL
  Filled 2015-05-17 (×2): qty 1

## 2015-05-17 MED ORDER — HYDRALAZINE HCL 50 MG PO TABS
50.0000 mg | ORAL_TABLET | Freq: Three times a day (TID) | ORAL | Status: DC
  Administered 2015-05-17: 50 mg via ORAL

## 2015-05-17 MED ORDER — INSULIN ASPART 100 UNIT/ML ~~LOC~~ SOLN
5.0000 [IU] | Freq: Three times a day (TID) | SUBCUTANEOUS | Status: DC
  Administered 2015-05-17 – 2015-05-18 (×3): 5 [IU] via SUBCUTANEOUS

## 2015-05-17 MED ORDER — HYDROCORTISONE 2.5 % RE CREA
TOPICAL_CREAM | Freq: Two times a day (BID) | RECTAL | Status: DC
  Administered 2015-05-17: 23:00:00 via RECTAL
  Filled 2015-05-17: qty 28.35

## 2015-05-17 MED ORDER — HYDRALAZINE HCL 50 MG PO TABS: 75.0000 mg | ORAL_TABLET | Freq: Three times a day (TID) | ORAL | Status: DC

## 2015-05-17 NOTE — Progress Notes (Signed)
Patient Name: Jerry Cantrell Date of Encounter: 05/17/2015  Principal Problem:   Chest pain at rest Active Problems:   Hyperlipemia   Hypertension   Obstructive sleep apnea   Decreased peripheral vision of left eye   Respiratory distress   CHF exacerbation   Elevated troponin   Pleural effusion   Acute on chronic diastolic CHF (congestive heart failure), NYHA class 3    Primary Cardiologist: Dr. Harrington Challenger Patient Profile: 49 yo male w/ hx CAP & resp failure, sepsis, ARF, acute encephalopathy- 2015, HTN, OSA, DM2, D-CHF, HL, and tobacco abuse who was admitted on 09/12 w/ sharp chest pain, hx cocaine use, volume overload, and  periph neuropathy.  SUBJECTIVE: Reports improvement in his breathing status. Notes his breathing is at its worse when he is lying down in bed. Denies any chest pain or palpitations.  OBJECTIVE Filed Vitals:   05/17/15 0625 05/17/15 0900 05/17/15 1018 05/17/15 1056  BP: 152/89 164/98 159/79 159/79  Pulse:  76 72   Temp:  98.6 F (37 C)    TempSrc:  Oral    Resp:  17    Height:      Weight:      SpO2:  97%      Intake/Output Summary (Last 24 hours) at 05/17/15 1252 Last data filed at 05/17/15 0513  Gross per 24 hour  Intake    480 ml  Output   1700 ml  Net  -1220 ml   Filed Weights   05/15/15 0500 05/16/15 0335 05/17/15 0500  Weight: 306 lb (138.8 kg) 306 lb 10.6 oz (139.1 kg) 301 lb 9.4 oz (136.8 kg)    PHYSICAL EXAM General: Well developed, well nourished, male in no acute distress. Head: Normocephalic, atraumatic.  Neck: Supple without bruits, JVD difficult to assess due to body habitus. Lungs:  Resp regular and unlabored, Rales at bases bilaterally. Heart: RRR, S1, S2, no S3, S4, or murmur; no rub. Abdomen: Soft, non-tender, non-distended with normoactive bowel sounds. No hepatomegaly. No rebound/guarding. No obvious abdominal masses. Extremities: No clubbing or cyanosis, 1+ edema up to mid-shins bilaterally. Distal pedal pulses are 2+  bilaterally. Neuro: Alert and oriented X 3. Moves all extremities spontaneously. Psych: Normal affect.   LABS: CBC: Recent Labs  05/16/15 0335 05/17/15 0520  WBC 6.9 7.0  HGB 14.9 15.4  HCT 49.2 49.4  MCV 96.9 96.7  PLT 154 160   INR:No results for input(s): INR in the last 72 hours. Basic Metabolic Panel: Recent Labs  05/16/15 0335 05/17/15 0520  NA 143 142  K 3.2* 3.8  CL 99* 99*  CO2 38* 34*  GLUCOSE 134* 173*  BUN 28* 28*  CREATININE 2.03* 2.00*  CALCIUM 8.5* 8.8*   BNP:  B NATRIURETIC PEPTIDE  Date/Time Value Ref Range Status  05/13/2015 11:42 AM 176.5* 0.0 - 100.0 pg/mL Final  01/18/2015 12:00 AM 279.0* 0.0 - 100.0 pg/mL Final   Fasting Lipid Panel: Recent Labs  05/15/15 0250  CHOL 191  HDL 37*  LDLCALC 126*  TRIG 141  CHOLHDL 5.2    ECHO: 05/15/2015 Study Conclusions - Left ventricle: The cavity size was mildly dilated. Wall thickness was increased in a pattern of moderate LVH. The estimated ejection fraction was 55%. Doppler parameters are consistent with abnormal left ventricular relaxation (grade 1 diastolic dysfunction). - Left atrium: The atrium was moderately dilated. - Atrial septum: No defect or patent foramen ovale was identified.  Radiology/Studies: Ct Chest Wo Contrast: 05/15/2015   CLINICAL DATA:  CHF, pleural effusion, hypertension, hyperlipidemia, diabetes mellitus with nephropathy, acute diastolic heart failure/CHF  EXAM: CT CHEST WITHOUT CONTRAST  TECHNIQUE: Multidetector CT imaging of the chest was performed following the standard protocol without IV contrast. Sagittal and coronal MPR images reconstructed from axial data set.  COMPARISON:  03/05/2010  FINDINGS: Aorta normal caliber.  Calcified RIGHT hilar and subcarinal lymph nodes.  Additional noncalcified mediastinal nodes largest RIGHT peritracheal 11 mm diameter image 22 and LEFT paratracheal 12 mm diameter.  Visualized upper abdomen unremarkable.  Heart enlarged.  Small to  moderate RIGHT pleural effusion.  Calcified granulomata RIGHT upper and RIGHT lower lobes.  Atelectasis of RIGHT lower lobe.  5 mm slightly ill-defined nodule inferior RIGHT upper lobe image 26 unchanged.  Posterior RIGHT upper lobe nodule 4 mm diameter image 16 unchanged.  Mild compressive atelectasis at posterior aspects of the RIGHT middle and RIGHT upper lobes.  Focal area of slightly nodular scarring antral lateral RIGHT middle lobe base image 40 unchanged.  Dependent atelectasis LEFT lower lobe.  5 mm LEFT lower lobe nodule image 37 not definitely seen previously.  No acute infiltrate or pneumothorax.  Scattered endplate spur formation throughout mid to inferior thoracic spine.  IMPRESSION: Small to moderate RIGHT pleural effusion.  Old granulomatous disease.  Scattered atelectasis greatest in RIGHT lower lobe.  BILATERAL pulmonary nodules, some stable, 5 mm nodule LEFT lower lobe not definitely previously visualized; recommendation below.  If the patient is at high risk for bronchogenic carcinoma, follow-up chest CT at 6-12 months is recommended. If the patient is at low risk for bronchogenic carcinoma, follow-up chest CT at 12 months is recommended. This recommendation follows the consensus statement: Guidelines for Management of Small Pulmonary Nodules Detected on CT Scans: A Statement from the [facility] as published in Radiology 2005;237:395-400.   Electronically Signed   By: Lavonia Dana M.D.   On: 05/15/2015 13:40   Current Medications:  . amLODipine  10 mg Oral Daily  . antiseptic oral rinse  7 mL Mouth Rinse BID  . aspirin  325 mg Oral Daily  . atorvastatin  80 mg Oral q1800  . carvedilol  25 mg Oral BID WC  . cloNIDine  0.2 mg Oral TID  . enoxaparin (LOVENOX) injection  70 mg Subcutaneous Q24H  . fenofibrate  160 mg Oral Daily  . fluticasone  1-2 spray Each Nare BID  . folic acid  1 mg Oral Daily  . furosemide  120 mg Intravenous 3 times per day   Followed by  . furosemide  80  mg Intravenous 3 times per day   Followed by  . [START ON 05/18/2015] furosemide  80 mg Intravenous BID  . hydrALAZINE  50 mg Oral 3 times per day  . insulin aspart  0-20 Units Subcutaneous TID WC  . insulin aspart  0-5 Units Subcutaneous QHS  . insulin aspart  5 Units Subcutaneous TID WC  . insulin glargine  50 Units Subcutaneous Daily  . isosorbide mononitrate  30 mg Oral Daily  . multivitamin with minerals  1 tablet Oral Daily  . omega-3 acid ethyl esters  2 g Oral BID  . sodium chloride  3 mL Intravenous Q12H  . thiamine  100 mg Oral Daily      ASSESSMENT AND PLAN:  1. Acute on Chronic Diastolic CHF - Net Output since admission is -9.9L. Net Output of -2.2L on 05/16/2015. - Weight is 301 lbs on 05/17/2015. Was 309lbs on admission. Baseline weight of 290lbs. - Diuresis with  Lasix 120mg  TID for 3 doses, then 80mg  TID for 3 doses, then 80mg  BID as recommended by Nephrology.   2. Acute on chronic kidney disease, Stage 3 - Creatinine trending back down and at 2.00 on 05/17/2015 - Being followed by Nephrology  3. Chest Pain with minimally elevated troponin - troponin peaked at .24 on 05/13/2015 and has since trended down. Likely demand ischemia in setting of sepsis and AKI. - remains without chest pain or anginal equivalents at this time. - will need OP stress test once current acute illness resolves.  4. Bilateral pulmonary nodules - noted on CT Scan on 05/15/2015 - will need repeat CT in 6-12 months and follow-up as outpatient  5. HTN - BP has been 152/79 - 181/114 in the past 24 hours.  - Continue current medical regimen. Imdur and Hydralazine recently added on 05/16/2015.  6. OSA - on CPAP  Otherwise, per admitting team.  Signed, Erma Heritage , PA-C 12:52 PM 05/17/2015 Pager: [phone]  The patient was seen, examined and discussed with Bernerd Pho, PA-C and I agree with the above.    49 yo male w/ hx CAP & resp failure, sepsis, ARF, acute encephalopathy  2015. Hx HTN, OSA, DM2, D-CHF, HL, tob use. Admitted 09/12 w/ sharp chest pain, hx cocaine use, volume overload, periph neuropathy. Troponin minimally elevated in the settings of sepsis and acute renal failure, ECG unchanged from June 2016. Prior echo LVEF 60%, repeat unchanged. I would treat for underlying pneumonia and schedule an outpatient stress test once her primary problem is resolved.  Continue iv lasix for acute on chronic diastolic CHF, significant diuresis, still fluid overloaded. Crea is stable. Now 301 lbs, from 310 on admission, baseline 290 lbs. Added amlodipine for hypertension, I would increase hydralazine to 75 mg po TID and imdur 30 mg po daily for HTN.  Dorothy Spark 05/17/2015

## 2015-05-17 NOTE — Progress Notes (Signed)
MD paged about patient wanting something for a hemorrhoid. Awaiting response.

## 2015-05-17 NOTE — Progress Notes (Signed)
Patient ID: Jerry Cantrell, male   DOB: Sep 27, 1965, 49 y.o.   MRN: [id number]  [facility] Progress Note   Assessment/ Plan:   1. Acute renal failure on chronic kidney disease stage IIIB: suspected to be hemodynamically mediated by CHF exacerbation/fluid shifts from nephrotic syndrome with interstitial excess and intravascular depletion.Continues to have good UOP in response to diuretics and negative I/O with attendant weight loss. No acute dialysis needs at this time. May be okay to send him tomorrow on lasix 80mg  PO BID 2. Nephrotic range proteinuria/anasarca/volume overload: Work-up consistent with diabetic kidney disease-serology is negative for lupus and no evidence of chronic hepatitis. Kidney biopsy not warranted. 3. Chest pain: Ongoing cardiology evaluation and cycling of cardiac enzymes for possible ACS. Echocardiogram shows normal/preserved EF with diastolic dysfunction. 4. Hypertension: Currently on amlodipine 5 mg daily, carvedilol 25 mg twice a day, clonidine 0.2 mg 3 times a day and intravenous furosemide 80 mg twice a day. Also on PRN hydralazine for blood pressure control. 5. Tobacco abuse/alcohol abuse: On thiamine/multivitamin and withdrawal precautions 6. Hypokalemia: We'll replace via oral route-K Dur 40 mEq twice a day 2 doses  Subjective:   Reports to be feeling better today and states he has to leave tomorrow to attend to a personal matter. Denies CP/SOB   Objective:   BP 159/79 mmHg  Pulse 72  Temp(Src) 98.6 F (37 C) (Oral)  Resp 17  Ht 6\' 1"  (1.854 m)  Wt 136.8 kg (301 lb 9.4 oz)  BMI 39.80 kg/m2  SpO2 97%  Intake/Output Summary (Last 24 hours) at 05/17/15 1041 Last data filed at 05/17/15 0513  Gross per 24 hour  Intake    720 ml  Output   1975 ml  Net  -1255 ml   Weight change: -2.3 kg (-5 lb 1.1 oz)  Physical Exam: Gen: comfortably sleeping in bed CVS: Pulse regular in rate and rhythm, S1 and S2 normal Resp: Decreased breath sounds over  bases-occasional expiratory wheeze Abd: Soft, obese, nontender Ext: 2+ anasarca  Imaging: Ct Chest Wo Contrast  05/15/2015   CLINICAL DATA:  CHF, pleural effusion, hypertension, hyperlipidemia, diabetes mellitus with nephropathy, acute diastolic heart failure/CHF  EXAM: CT CHEST WITHOUT CONTRAST  TECHNIQUE: Multidetector CT imaging of the chest was performed following the standard protocol without IV contrast. Sagittal and coronal MPR images reconstructed from axial data set.  COMPARISON:  03/05/2010  FINDINGS: Aorta normal caliber.  Calcified RIGHT hilar and subcarinal lymph nodes.  Additional noncalcified mediastinal nodes largest RIGHT peritracheal 11 mm diameter image 22 and LEFT paratracheal 12 mm diameter.  Visualized upper abdomen unremarkable.  Heart enlarged.  Small to moderate RIGHT pleural effusion.  Calcified granulomata RIGHT upper and RIGHT lower lobes.  Atelectasis of RIGHT lower lobe.  5 mm slightly ill-defined nodule inferior RIGHT upper lobe image 26 unchanged.  Posterior RIGHT upper lobe nodule 4 mm diameter image 16 unchanged.  Mild compressive atelectasis at posterior aspects of the RIGHT middle and RIGHT upper lobes.  Focal area of slightly nodular scarring antral lateral RIGHT middle lobe base image 40 unchanged.  Dependent atelectasis LEFT lower lobe.  5 mm LEFT lower lobe nodule image 37 not definitely seen previously.  No acute infiltrate or pneumothorax.  Scattered endplate spur formation throughout mid to inferior thoracic spine.  IMPRESSION: Small to moderate RIGHT pleural effusion.  Old granulomatous disease.  Scattered atelectasis greatest in RIGHT lower lobe.  BILATERAL pulmonary nodules, some stable, 5 mm nodule LEFT lower lobe not definitely previously visualized; recommendation  below.  If the patient is at high risk for bronchogenic carcinoma, follow-up chest CT at 6-12 months is recommended. If the patient is at low risk for bronchogenic carcinoma, follow-up chest CT at 12  months is recommended. This recommendation follows the consensus statement: Guidelines for Management of Small Pulmonary Nodules Detected on CT Scans: A Statement from the [place] as published in Radiology 2005;237:395-400.   Electronically Signed   By: Lavonia Dana M.D.   On: 05/15/2015 13:40    Labs: BMET  Recent Labs Lab 05/13/15 1142 05/14/15 0246 05/15/15 1000 05/16/15 0335 05/17/15 0520  NA 143 137 141 143 142  K 3.9 4.8 4.1 3.2* 3.8  CL 105 99* 97* 99* 99*  CO2 30 32 36* 38* 34*  GLUCOSE 238* 420* 178* 134* 173*  BUN 29* 32* 31* 28* 28*  CREATININE 2.35* 2.57* 2.34* 2.03* 2.00*  CALCIUM 8.6* 8.4* 8.5* 8.5* 8.8*   CBC  Recent Labs Lab 05/14/15 0246 05/15/15 1000 05/16/15 0335 05/17/15 0520  WBC 8.7 8.4 6.9 7.0  HGB 16.0 15.7 14.9 15.4  HCT 52.5* 52.5* 49.2 49.4  MCV 97.8 99.2 96.9 96.7  PLT 159 145* 154 160    Medications:    . amLODipine  10 mg Oral Daily  . antiseptic oral rinse  7 mL Mouth Rinse BID  . aspirin  325 mg Oral Daily  . atorvastatin  80 mg Oral q1800  . carvedilol  25 mg Oral BID WC  . cloNIDine  0.2 mg Oral TID  . enoxaparin (LOVENOX) injection  70 mg Subcutaneous Q24H  . fenofibrate  160 mg Oral Daily  . fluticasone  1-2 spray Each Nare BID  . folic acid  1 mg Oral Daily  . furosemide  120 mg Intravenous 3 times per day   Followed by  . furosemide  80 mg Intravenous 3 times per day   Followed by  . [START ON 05/18/2015] furosemide  80 mg Intravenous BID  . hydrALAZINE  25 mg Oral 3 times per day  . insulin aspart  0-20 Units Subcutaneous TID WC  . insulin aspart  0-5 Units Subcutaneous QHS  . insulin aspart  5 Units Subcutaneous TID WC  . insulin glargine  50 Units Subcutaneous Daily  . isosorbide mononitrate  30 mg Oral Daily  . multivitamin with minerals  1 tablet Oral Daily  . omega-3 acid ethyl esters  2 g Oral BID  . sodium chloride  3 mL Intravenous Q12H  . thiamine  100 mg Oral Daily   Elmarie Shiley, MD 05/17/2015,  10:41 AM

## 2015-05-17 NOTE — Progress Notes (Signed)
Family Medicine Teaching Service Daily Progress Note Intern Pager: [phone]  Patient name: Jerry Cantrell Medical record number: [id number] Date of birth: September 14, 1965 Age: 49 y.o. Gender: male  Primary Care Provider: Benito Mccreedy, MD Consultants: Cardiology, Nephrology  Code Status: Full  Pt Overview and Major Events to Date:  9/12: Admitted for ACS r/o and possible CHF exacerbation  9/14: Echo: LVEF [id number], Grade I Diastolic Dysfuction [id number]: Increased Lasix dose   Assessment and Plan: Jerry Cantrell is a 49 y.o. male presenting with one month of progressive dyspnea and lower extremity edema, as well as two days of chest tightness. PMH is significant for diastolic HFpEF, HTN, HLD, T2DM, CKD Stage III, OSA, and tobacco use.  CHF Exacerbation, ACS r/o: Most recent echo (01/18/15) with EF 50-55% and grade 1 diastolic dysfunction. Most likely diagnosis in this patient with history of HFpEF and progressive shortness of breath and edema is CHF exacerbation. This diagnosis is further supported by CXR notable for changes suggestive of volume overload and an elevated BNP. Of note, the patient has multiple risk factors for CAD including obesity, HLD, and T2DM and admission EKG was concerning for possible septal infarct with mild troponin elevation. Also of note, patient with recent admission for community acquired pneumonia complicated by sepsis, acute encephalopathy, and acute renal failure. CXR on admission shows resolution of previous RUL pneumonia with subsequent scarring, but is notable for a new dense opacity at the right lung base that could represent pneumonia or atelectasis. Repeat CXR demonstrated slightly less consolidation at R lung base. On presentation, he is without leukocytosis and has been afebrile; however, pneumonia remains on the differential diagnosis. - Cardiology following, appreciate recommendations. - Telemetry -Nephrology following, appreciate recs -increase lasix to 120mg  TID for 3  doses>> 80mg  TID x3 doses >> 80 mg BID; may need metolazone if urine output not impressive; no acute dialysis needed at present  - Strict I&Os: net output of 2.2 L  - echo: LVEF 55%, mild LVH, Grade I diastolic dysfunction  - Fasting lipid panel for risk stratification: Cholesterol 191, HDl 37, LCl 126 - 92% and > on RA -10 year risk of CVD 28.0% >> increase Lipitor to 80 mg daily  -daily weights: 309 on admission >>308 >> 306 >>301 (8 lb weight loss total) -CT chest: Small to moderate right pleural effusion. Scattered atelectasis greatest in R lower lobe. Bilateral pulmonary nodules, some stable, 5 mm nodule left lower lobe not previously visualized >> will need repeat chest CT in 6 months d/t smoking history     Hypertension: Patient on carvedilol 25 mg PO BID, clonidine 0.2 mg PO BID, hydralazine 10 mg PO TID, metolazone 5 mg PO daily, and verapamil 120 mg ER qhs. - Continue carvedilol 25 mg PO BID, clonidine 0.2 mg PO BID, amlodipine 10 mg daily  -increased Hydralazine to 50mg  TID  - Hydralazine 5 mg IV PRN for systolics [id number] or diastolics [id number]  OSA: During previous admissions, patient became severely hypoxic while asleep. He was evaluated recently on an outpatient basis and deemed appropriate for CPAP, but never had this delivered. - CPAP during admission; pt has been refusing to use  -willing to use CPAP at home after discharge   AKI on CKD Stage III: Baseline Cr 1.3-1.5; Cr 2.35 on admission with BUN:Cr <20:1. Likely secondary to volume overload. Followed by Nephrologist, Dr. Justin Cantrell. Patient reports that he was recently placed on the transplant list. Lupus workup neg. - IV Lasix as above - Daily BMP, CTM  -Cr 2.00 (  9/16) -nephro following; appreciate recs >>baseline CKD with nephrotic range proteinuria from poorly controlled DM, volume shifts associated with nephrotic syndrome and possibly CHF exacerbation   Type II Diabetes Mellitus: Most recent A1c 8.2 (05/02/15). Currently on Lantus 35  U qhs and 40 U qAM. Per note by PCP, blood glucose suboptimally controlled and he was instructed to increase to Lantus 50 U BID (05/02/15); however, he was concerned regarding AM lows and did not increase his Lantus dose as instructed. - Moderate SSI - Lantus 50 U qAM  -added Novolog 5 units with meals   Depression: Stable on Elavil. - Hold home Elavil 50 mg PO qhs while in SDU  Tobacco Use: Currently attempting cessation with nicotine patches. - Nicotine patch prn during admission - Continue encouragement of cessation  FEN/GI: NPO Prophylaxis: Lovenox 70 mg q 24hr  Disposition: home pending improvement of acute hypoxemic respiratory failure   Subjective:  Is now comfortable on RA while awake. Concerned about not finishing treatment in time to see son before he leaves for prison.   Objective: Temp:  [98.1 F (36.7 C)-98.7 F (37.1 C)] 98.1 F (36.7 C) (09/16 0521) Pulse Rate:  [66-79] 68 (09/16 0521) Resp:  [16-19] 18 (09/16 0521) BP: (152-181)/(88-114) 152/89 mmHg (09/16 0625) SpO2:  [92 %-100 %] 94 % (09/16 0521) Weight:  [301 lb 9.4 oz (136.8 kg)] 301 lb 9.4 oz (136.8 kg) (09/16 0500) Physical Exam: General: obese male  Cardiovascular: RRR. No murmurs appreciated  Respiratory: Mildly diminished breath sounds at bases. Moving air well.  Abdomen: obese, +bs, soft, NT and mildly edematous  Extremities: 2+ pitting edema of LE to thighs, edema of hands bilaterally  Laboratory:  Recent Labs Lab 05/15/15 1000 05/16/15 0335 05/17/15 0520  WBC 8.4 6.9 7.0  HGB 15.7 14.9 15.4  HCT 52.5* 49.2 49.4  PLT 145* 154 160    Recent Labs Lab 05/14/15 0246 05/15/15 1000 05/16/15 0335 05/17/15 0520  NA 137 141 143 142  K 4.8 4.1 3.2* 3.8  CL 99* 97* 99* 99*  CO2 32 36* 38* 34*  BUN 32* 31* 28* 28*  CREATININE 2.57* 2.34* 2.03* 2.00*  CALCIUM 8.4* 8.5* 8.5* 8.8*  PROT 5.8*  --   --   --   BILITOT 0.4  --   --   --   ALKPHOS 92  --   --   --   ALT 15*  --   --   --   AST  14*  --   --   --   GLUCOSE 420* 178* 134* 173*    Imaging/Diagnostic Tests: ECHO May 2016 Left ventricle: Abnormal septal motion The cavity size was mildly dilated. There was mild concentric hypertrophy. Systolic function was normal. The estimated ejection fraction was in the range of 55% to 60%. Wall motion was normal; there were no regional wall motion abnormalities. - Left atrium: The atrium was mildly dilated. - Atrial septum: There was increased thickness of the septum, consistent with lipomatous hypertrophy. No defect or patent foramen ovale was identified. - Pulmonary arteries: PA peak pressure: 41 mm Hg (S).   EKG: Monday May 13 2015 11:44:07 EDT Ventricular Rate: 68 PR Interval: 184 QRS Duration: 122 QT Interval: 438 QTC Calculation: 465 R Axis:-48 Text Interpretation: Normal sinus rhythm. Left axis deviation. Septal infarct, age undetermined. Abnormal ECG. No significant change since last tracing. Confirmed by Carmin Muskrat MD [phone]) on 05/13/2015 12:06:04 PM  Dg Chest 2 View  05/13/2015 CLINICAL DATA: Shortness of breath  intermittently getting worse for few weeks. Chest discomfort. EXAM: CHEST 2 VIEW COMPARISON: Chest x-rays dated 02/05/2015 and 01/19/2015. FINDINGS: Compared to the most recent study of 02/05/2015, there is a new dense opacity at the right lung base which could represent pneumonia or atelectasis with adjacent pleural effusion. There is improved aeration within the right upper lobe compatible with continued improvement of patient's previously described right upper lobe pneumonia. There is mild interstitial prominence bilaterally suggesting edema. Linear atelectasis versus chronic scarring again noted within the left mid lung region. No pneumothorax. Cardiomediastinal silhouette appears stable in size and configuration. No acute osseous abnormality. IMPRESSION: New dense opacity at the right lung base, centered within the right  middle lobe, which could represent pneumonia or atelectasis. Aspiration also possible. There is also a right pleural effusion which is probably small. Cardiomegaly with central pulmonary vascular congestion and mild bilateral interstitial edema suggesting some degree of volume overload/ CHF. Continued improvement of the previously described right upper lobe pneumonia. I suspect that the previous right upper lobe pneumonia has completely resolved and that the linear opacities at the periphery of the right upper lobe are areas of chronic scarring (sequela of the previous pneumonia). Electronically Signed By: Franki Cabot M.D. On: 05/13/2015 13:12    No results found. Echo 9/14 Study Conclusions - Left ventricle: The cavity size was mildly dilated. Wall thickness was increased in a pattern of moderate LVH. The estimated ejection fraction was 55%. Doppler parameters are consistent with abnormal left ventricular relaxation (grade 1 diastolic dysfunction). - Left atrium: The atrium was moderately dilated. - Atrial septum: No defect or patent foramen ovale was identified  Nicolette Bang, DO 05/17/2015, 7:36 AM PGY-1, [place] Intern pager: [phone], text pages welcome

## 2015-05-18 LAB — CBC
HEMATOCRIT: 49.5 % (ref 39.0–52.0)
Hemoglobin: 15.3 g/dL (ref 13.0–17.0)
MCH: 29.4 pg (ref 26.0–34.0)
MCHC: 30.9 g/dL (ref 30.0–36.0)
MCV: 95.2 fL (ref 78.0–100.0)
Platelets: 156 10*3/uL (ref 150–400)
RBC: 5.2 MIL/uL (ref 4.22–5.81)
RDW: 15.2 % (ref 11.5–15.5)
WBC: 6 10*3/uL (ref 4.0–10.5)

## 2015-05-18 LAB — BASIC METABOLIC PANEL
ANION GAP: 7 (ref 5–15)
BUN: 27 mg/dL — ABNORMAL HIGH (ref 6–20)
CALCIUM: 9 mg/dL (ref 8.9–10.3)
CO2: 38 mmol/L — AB (ref 22–32)
CREATININE: 2.06 mg/dL — AB (ref 0.61–1.24)
Chloride: 97 mmol/L — ABNORMAL LOW (ref 101–111)
GFR, EST AFRICAN AMERICAN: 42 mL/min — AB (ref >60)
GFR, EST NON AFRICAN AMERICAN: 36 mL/min — AB (ref >60)
Glucose, Bld: 65 mg/dL (ref 65–99)
Potassium: 3.6 mmol/L (ref 3.5–5.1)
SODIUM: 142 mmol/L (ref 135–145)

## 2015-05-18 LAB — GLUCOSE, CAPILLARY
GLUCOSE-CAPILLARY: 52 mg/dL — AB (ref 65–99)
Glucose-Capillary: 120 mg/dL — ABNORMAL HIGH (ref 65–99)
Glucose-Capillary: 261 mg/dL — ABNORMAL HIGH (ref 65–99)

## 2015-05-18 MED ORDER — FENOFIBRATE 160 MG PO TABS: 160.0000 mg | ORAL_TABLET | Freq: Every day | ORAL | Status: DC

## 2015-05-18 MED ORDER — ATORVASTATIN CALCIUM 80 MG PO TABS: 80.0000 mg | ORAL_TABLET | Freq: Every day | ORAL | Status: DC

## 2015-05-18 MED ORDER — FUROSEMIDE 80 MG PO TABS: ORAL_TABLET | ORAL | Status: DC

## 2015-05-18 MED ORDER — POTASSIUM CHLORIDE ER 20 MEQ PO TBCR: EXTENDED_RELEASE_TABLET | ORAL | Status: DC

## 2015-05-18 NOTE — Discharge Instructions (Signed)
You were hospitalized for an exacerbation of heart failure which worsened your breathing. You were also evaluated for chest pain. As you know, it is against medical advice for you to leave the hospital at this time. Treatment of your heart failure exacerbation is best while in the hospital where your heart and blood tests can be monitored closely. YOU ARE AT HIGH RISK OF A CARDIAC EVENT INCLUDING HEART ATTACK.   Lasix has been sent to the [facility] on [address] Take this as directed, 3 times a day for 3 days, 2 times a day for 3 days, then once a day until you follow up with Dr. Justin Mend. Because you're taking lasix you need to take potassium [facility]). Take this as directed (2 tabs a day for 6 days, then one a day).   Other medications were changed as follows:  To treat high cholesterol, you should increase the dose of lipitor to 80mg  daily (was 40mg  daily).  STOP take gemfibrozil (lopid) because it could increase the risk of muscle damage when taking a higher dose of lipitor.  START taking fenofibrate to treat high triglycerides, to replace gemfibrozil.  Prescriptions for these have been sent to your pharmacy.   Call the [facility] practice if you do not hear from them in the next week or 2. You will need an outpatient stress test to check your heart after this episode has resolved.   You must get a CPAP as soon as possible to improve your heart function.   If you feel short of breath or chest pain again you need to return to the hospital.

## 2015-05-18 NOTE — Progress Notes (Signed)
Patient ID: Jerry Cantrell, male   DOB: 08-27-1966, 49 y.o.   MRN: [id number]  [facility] Progress Note   Assessment/ Plan:   1. Acute renal failure on chronic kidney disease stage IIIB: suspected to be hemodynamically mediated by CHF exacerbation/fluid shifts from nephrotic syndrome with interstitial excess and intravascular depletion.Excellent UOP and creatinine unchanged at his baseline- DC home on lasix 80mg  PO BID--instructed to call on Monday for appt with Dr.Webb ([facility]) 2. Nephrotic range proteinuria/anasarca/volume overload: Work-up consistent with diabetic kidney disease-serology is negative for lupus and no evidence of chronic hepatitis. Kidney biopsy not warranted. 3. Chest pain: Ongoing cardiology evaluation and cycling of cardiac enzymes for possible ACS. Echocardiogram shows normal/preserved EF with diastolic dysfunction. 4. Hypertension: Currently on amlodipine 10 mg daily, carvedilol 25 mg twice a day, clonidine 0.2 mg 3 times a day and intravenous furosemide 80 mg twice a day. Stressed compliance 5. Tobacco abuse/alcohol abuse: On thiamine/multivitamin and withdrawal precautions 6. Hypokalemia: We'll replace via oral route-K Dur 40 mEq twice a day 2 doses  Subjective:   Reports to be feeling better today and states he has to leave today "by any means" to attend to a personal matter. Denies CP/SOB   Objective:   BP 174/108 mmHg  Pulse 73  Temp(Src) 97.8 F (36.6 C) (Oral)  Resp 18  Ht 6\' 1"  (1.854 m)  Wt 135.5 kg (298 lb 11.6 oz)  BMI 39.42 kg/m2  SpO2 92%  Intake/Output Summary (Last 24 hours) at 05/18/15 1101 Last data filed at 05/18/15 [id number]  Gross per 24 hour  Intake    920 ml  Output   5200 ml  Net  -4280 ml   Weight change: -1.3 kg (-2 lb 13.9 oz)  Physical Exam: Gen: comfortably walking around room CVS: Pulse regular in rate and rhythm, S1 and S2 normal Resp: Decreased breath sounds over bases-no rales/rhonchi Abd: Soft, obese, nontender Ext:  2+ LE/UE edema  Imaging: No results found.  Labs: BMET  Recent Labs Lab 05/13/15 1142 05/14/15 0246 05/15/15 1000 05/16/15 0335 05/17/15 0520 05/18/15 0600  NA 143 137 141 143 142 142  K 3.9 4.8 4.1 3.2* 3.8 3.6  CL 105 99* 97* 99* 99* 97*  CO2 30 32 36* 38* 34* 38*  GLUCOSE 238* 420* 178* 134* 173* 65  BUN 29* 32* 31* 28* 28* 27*  CREATININE 2.35* 2.57* 2.34* 2.03* 2.00* 2.06*  CALCIUM 8.6* 8.4* 8.5* 8.5* 8.8* 9.0   CBC  Recent Labs Lab 05/15/15 1000 05/16/15 0335 05/17/15 0520 05/18/15 0600  WBC 8.4 6.9 7.0 6.0  HGB 15.7 14.9 15.4 15.3  HCT 52.5* 49.2 49.4 49.5  MCV 99.2 96.9 96.7 95.2  PLT 145* 154 160 156    Medications:    . amLODipine  10 mg Oral Daily  . antiseptic oral rinse  7 mL Mouth Rinse BID  . aspirin  325 mg Oral Daily  . atorvastatin  80 mg Oral q1800  . carvedilol  25 mg Oral BID WC  . cloNIDine  0.2 mg Oral TID  . enoxaparin (LOVENOX) injection  70 mg Subcutaneous Q24H  . fenofibrate  160 mg Oral Daily  . fluticasone  1-2 spray Each Nare BID  . folic acid  1 mg Oral Daily  . furosemide  80 mg Intravenous BID  . hydrALAZINE  50 mg Oral 3 times per day  . hydrocortisone   Rectal BID  . insulin aspart  0-20 Units Subcutaneous TID WC  . insulin aspart  0-5 Units Subcutaneous QHS  . insulin aspart  5 Units Subcutaneous TID WC  . insulin glargine  50 Units Subcutaneous Daily  . isosorbide mononitrate  30 mg Oral Daily  . multivitamin with minerals  1 tablet Oral Daily  . omega-3 acid ethyl esters  2 g Oral BID  . sodium chloride  3 mL Intravenous Q12H  . thiamine  100 mg Oral Daily   Elmarie Shiley, MD 05/18/2015, 11:01 AM

## 2015-05-18 NOTE — Progress Notes (Signed)
Discussed discharge summary with patient. Reviewed all medications with patient. Patient ready for discharge.  

## 2015-05-18 NOTE — Progress Notes (Signed)
Will see again Monday  Call for questions this weekend

## 2015-05-18 NOTE — Progress Notes (Signed)
Hypoglycemic Event  CBG: 52  Treatment: 15 GM carbohydrate snack  Symptoms: None  Follow-up CBG: Time:0807 CBG Result:120  Possible Reasons for Event: Unknown  Comments/MD notified:    Suzan Nailer  Remember to initiate Hypoglycemia Order Set & complete

## 2015-05-21 ENCOUNTER — Telehealth: Payer: Self-pay

## 2015-05-21 NOTE — Telephone Encounter (Signed)
RA had cancellation for tomorrow at 11:30am;pt had to call and make arrangements to get here at that time.     Spoke with patient;states he called back and confirmed he could be here tomorrow to see RA. Nothing more needed at this time.

## 2015-05-22 ENCOUNTER — Ambulatory Visit (INDEPENDENT_AMBULATORY_CARE_PROVIDER_SITE_OTHER): Payer: Medicaid Other | Admitting: Pulmonary Disease

## 2015-05-22 ENCOUNTER — Encounter: Payer: Self-pay | Admitting: Pulmonary Disease

## 2015-05-22 VITALS — BP 140/80 | HR 68 | Ht 73.0 in | Wt 300.0 lb

## 2015-05-22 DIAGNOSIS — G4733 Obstructive sleep apnea (adult) (pediatric): Secondary | ICD-10-CM

## 2015-05-22 DIAGNOSIS — R911 Solitary pulmonary nodule: Secondary | ICD-10-CM

## 2015-05-22 NOTE — Patient Instructions (Signed)
You have severe OSA  We will get you started on a Bipap machine with a full face mask Use it every night at least 6h / night

## 2015-05-22 NOTE — Assessment & Plan Note (Signed)
Repeat scan in 10/2015

## 2015-05-22 NOTE — Assessment & Plan Note (Signed)
He does have severe OSA and requires high pressure on BiPAP to correct this. BiPAP was used since CPAP was unable to correct. Due to high pressure required, I will start him on auto BiPAP settings, we will use IPAP 10-25 and EPAP 10-20, add humidity, and check a download in 4 weeks. Compliance was emphasized overnight and rolled his son who lives with him to ensure this. Or full at this will help him with his nocturnal ventilation, and perhaps also correct some of the issue of recurrent volume overload  Weight loss encouraged, compliance with goal of at least 6 hrs every night is the expectation. Advised against medications with sedative side effects Cautioned against driving when sleepy - understanding that sleepiness will vary on a day to day basis

## 2015-05-22 NOTE — Progress Notes (Signed)
Subjective:    Patient ID: Jerry Cantrell, male    DOB: [birth year], 49 y.o.   MRN: [id number]  HPI  49 year old man presents for evaluation of sleep-disordered breathing. He has chronic diastolic heart failure, severe hypertension, uncontrolled in spite of for medications and stage III CKD. He is a heavy smoker and has a history of cocaine use in the past. He had a recent admission in 05/2015 for volume overload and uncontrolled hypertension. Epworth sleepiness score is 11. Sleep history is obtained from his 2 sons who accompany him. They have noted loud snoring and gasping episodes during sleep. Bedtime is between 9 and 11 PM, sleep latency is about an hour, he sleeps on his right side with 2 pillows, reports 3-4 nocturnal awakenings including nocturia and is out of bed as late as 10 AM feeling tired with dryness of mouth and occasional headache. He is gained 20 pounds over the last 2 years There is no history suggestive of cataplexy, sleep paralysis or parasomnias   Significant tests/ events  CT chest 05/2015 -evidence of old granulomatous disease with calcified right hilar lymph nodes, bilateral subcentimeter nodules , some new compared to 02/2010  PSG 01/2015- severe OSA, AHI 99/hour, not corrected by CPAP required BiPAP 25/21 with a full face mask  Past Medical History  Diagnosis Date  . Hypertension   . Hyperlipidemia   . Meralgia paraesthetica 05/03/2012  . Lumbar spondylosis 05/03/2012  . Lumbar spinal stenosis 05/03/2012    Mild with only right L4 nerve root encroachment, no neurogenic claudication   . Urinary incontinence 05/12/2012    Since the start of Sept. 2013   . Hemorrhoids 05/12/2012  . UTI (lower urinary tract infection)   . Meralgia paraesthetica 05/03/2012    On Lyrica which does improve pain.   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 Substance abuse   . Hyperlipidemia   . Pneumonia   . Diabetes mellitus   . 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 Acute diastolic  heart failure   . Retinopathy   . CHF (congestive heart failure)   . Obesity    Past Surgical History  Procedure Laterality Date  . Tonsillectomy    . Abdominal surgery      Abscess I&D 2/2 infected hair  . Colonoscopy w/ polypectomy      pt to bring records    No Known Allergies  Social History   Social History  . Marital Status: Single    Spouse Name: N/A  . Number of Children: N/A  . Years of Education: N/A   Occupational History  . dietary services [employer]   Social History Main Topics  . Smoking status: Current Every Day Smoker -- 0.40 packs/day for 30 years    Types: Cigarettes  . Smokeless tobacco: Never Used     Comment: "Trying - hard to stop"  . Alcohol Use: 3.6 oz/week    6 Cans of beer per week     Comment: beer  . Drug Use: Yes    Special: Marijuana     Comment: marijuana "laced with something"  . Sexual Activity: Not on file   Other Topics Concern  . Not on file   Social History Narrative    Family History  Problem Relation Age of Onset  . Prostate cancer Father   . Alcohol abuse Father   . Emphysema Father     smoked     Review of Systems  Constitutional: Negative for fever, chills, activity change,  appetite change and unexpected weight change.  HENT: Negative for congestion, dental problem, postnasal drip, rhinorrhea, sneezing, sore throat, trouble swallowing and voice change.   Eyes: Negative for visual disturbance.  Respiratory: Negative for cough, choking and shortness of breath.   Cardiovascular: Negative for chest pain and leg swelling.  Gastrointestinal: Negative for nausea, vomiting and abdominal pain.  Genitourinary: Negative for difficulty urinating.  Musculoskeletal: Negative for arthralgias.  Skin: Negative for rash.  Psychiatric/Behavioral: Negative for behavioral problems and confusion.       Objective:   Physical Exam  Gen. Pleasant, obese, in no distress, normal affect ENT - no lesions, no post nasal drip,  class 2-3 airway Neck: No JVD, no thyromegaly, no carotid bruits Lungs: no use of accessory muscles, no dullness to percussion, decreased without rales or rhonchi  Cardiovascular: Rhythm regular, heart sounds  normal, no murmurs or gallops, 2+ peripheral edema Abdomen: soft and non-tender, no hepatosplenomegaly, BS normal. Musculoskeletal: No deformities, no cyanosis or clubbing Neuro:  alert, non focal, no tremors        Assessment & Plan:

## 2015-05-28 ENCOUNTER — Telehealth: Payer: Self-pay | Admitting: Internal Medicine

## 2015-05-28 NOTE — Telephone Encounter (Signed)
Pt called because he states when he was in the hospital in 9/12  Among other diagnosis he was seen by cardiologist for chest pain and acute on chronic Diastolic CHF. Pt states was diagnosed with CHF about 2 years ago, but never was f/u by a cardiologist. Pt was D/C on  05/18/15.  Pt states he left early before the MD was ready to be D/C because he needed something to do. Pt was seen on 9/12 by Dr Harrington Challenger so Dr. Harrington Challenger MD was assigned as his primary cardiologist. Pt was seen also by Dr Caryl Comes and Dr Meda Coffee while in the hospital. On 9/16 Dr Francesca Oman note reads that pt will need an output stress test once current illness resolves. Pt has not been seen in this office prior now. Does pt needs to be seen in this office prior ordering the stress test. If we needs to order the stress test, we need to know what kind of stress test to order to order.

## 2015-05-28 NOTE — Telephone Encounter (Signed)
New problem   Pt stated he was told by Dr Caryl Comes to set up a stress test, no order was in the system, please advise pt.

## 2015-05-29 NOTE — Telephone Encounter (Signed)
I would set up to be seen in clinic prior to ordering a stress test.  Follow BP control

## 2015-05-30 ENCOUNTER — Other Ambulatory Visit: Payer: Self-pay | Admitting: Pulmonary Disease

## 2015-05-30 ENCOUNTER — Encounter: Payer: Self-pay | Admitting: Pulmonary Disease

## 2015-05-30 DIAGNOSIS — G4733 Obstructive sleep apnea (adult) (pediatric): Secondary | ICD-10-CM

## 2015-05-30 NOTE — Telephone Encounter (Signed)
Sent message to scheduler to set up appointment with Dr. Harrington Challenger.

## 2015-06-06 ENCOUNTER — Ambulatory Visit: Payer: Self-pay | Admitting: Adult Health

## 2015-06-06 ENCOUNTER — Inpatient Hospital Stay (HOSPITAL_COMMUNITY): Payer: Medicaid Other

## 2015-06-06 ENCOUNTER — Encounter (HOSPITAL_COMMUNITY): Payer: Self-pay | Admitting: Emergency Medicine

## 2015-06-06 ENCOUNTER — Inpatient Hospital Stay (HOSPITAL_COMMUNITY)
Admission: EM | Admit: 2015-06-06 | Discharge: 2015-06-14 | DRG: 208 | Discharge status: Against Medical Advice | Payer: Medicaid Other | Attending: Family Medicine | Admitting: Family Medicine

## 2015-06-06 ENCOUNTER — Emergency Department (HOSPITAL_COMMUNITY): Payer: Medicaid Other

## 2015-06-06 DIAGNOSIS — N179 Acute kidney failure, unspecified: Secondary | ICD-10-CM | POA: Diagnosis present

## 2015-06-06 DIAGNOSIS — J9610 Chronic respiratory failure, unspecified whether with hypoxia or hypercapnia: Secondary | ICD-10-CM | POA: Insufficient documentation

## 2015-06-06 DIAGNOSIS — E11319 Type 2 diabetes mellitus with unspecified diabetic retinopathy without macular edema: Secondary | ICD-10-CM | POA: Diagnosis present

## 2015-06-06 DIAGNOSIS — I5031 Acute diastolic (congestive) heart failure: Secondary | ICD-10-CM

## 2015-06-06 DIAGNOSIS — I509 Heart failure, unspecified: Secondary | ICD-10-CM

## 2015-06-06 DIAGNOSIS — J9690 Respiratory failure, unspecified, unspecified whether with hypoxia or hypercapnia: Secondary | ICD-10-CM | POA: Diagnosis not present

## 2015-06-06 DIAGNOSIS — G5710 Meralgia paresthetica, unspecified lower limb: Secondary | ICD-10-CM | POA: Diagnosis present

## 2015-06-06 DIAGNOSIS — E875 Hyperkalemia: Secondary | ICD-10-CM | POA: Insufficient documentation

## 2015-06-06 DIAGNOSIS — E785 Hyperlipidemia, unspecified: Secondary | ICD-10-CM | POA: Diagnosis present

## 2015-06-06 DIAGNOSIS — I161 Hypertensive emergency: Secondary | ICD-10-CM | POA: Diagnosis present

## 2015-06-06 DIAGNOSIS — F17210 Nicotine dependence, cigarettes, uncomplicated: Secondary | ICD-10-CM | POA: Diagnosis present

## 2015-06-06 DIAGNOSIS — R739 Hyperglycemia, unspecified: Secondary | ICD-10-CM | POA: Insufficient documentation

## 2015-06-06 DIAGNOSIS — F17200 Nicotine dependence, unspecified, uncomplicated: Secondary | ICD-10-CM | POA: Insufficient documentation

## 2015-06-06 DIAGNOSIS — Z794 Long term (current) use of insulin: Secondary | ICD-10-CM

## 2015-06-06 DIAGNOSIS — E1140 Type 2 diabetes mellitus with diabetic neuropathy, unspecified: Secondary | ICD-10-CM | POA: Diagnosis present

## 2015-06-06 DIAGNOSIS — E11649 Type 2 diabetes mellitus with hypoglycemia without coma: Secondary | ICD-10-CM | POA: Diagnosis not present

## 2015-06-06 DIAGNOSIS — I129 Hypertensive chronic kidney disease with stage 1 through stage 4 chronic kidney disease, or unspecified chronic kidney disease: Secondary | ICD-10-CM | POA: Diagnosis present

## 2015-06-06 DIAGNOSIS — J9601 Acute respiratory failure with hypoxia: Secondary | ICD-10-CM | POA: Diagnosis present

## 2015-06-06 DIAGNOSIS — D751 Secondary polycythemia: Secondary | ICD-10-CM | POA: Diagnosis present

## 2015-06-06 DIAGNOSIS — Z781 Physical restraint status: Secondary | ICD-10-CM | POA: Diagnosis not present

## 2015-06-06 DIAGNOSIS — I10 Essential (primary) hypertension: Secondary | ICD-10-CM | POA: Insufficient documentation

## 2015-06-06 DIAGNOSIS — E1110 Type 2 diabetes mellitus with ketoacidosis without coma: Secondary | ICD-10-CM | POA: Insufficient documentation

## 2015-06-06 DIAGNOSIS — N183 Chronic kidney disease, stage 3 (moderate): Secondary | ICD-10-CM | POA: Diagnosis present

## 2015-06-06 DIAGNOSIS — E1165 Type 2 diabetes mellitus with hyperglycemia: Secondary | ICD-10-CM | POA: Diagnosis present

## 2015-06-06 DIAGNOSIS — E669 Obesity, unspecified: Secondary | ICD-10-CM | POA: Diagnosis present

## 2015-06-06 DIAGNOSIS — E1122 Type 2 diabetes mellitus with diabetic chronic kidney disease: Secondary | ICD-10-CM | POA: Diagnosis present

## 2015-06-06 DIAGNOSIS — I5021 Acute systolic (congestive) heart failure: Secondary | ICD-10-CM | POA: Diagnosis not present

## 2015-06-06 DIAGNOSIS — I82402 Acute embolism and thrombosis of unspecified deep veins of left lower extremity: Secondary | ICD-10-CM | POA: Diagnosis not present

## 2015-06-06 DIAGNOSIS — R4182 Altered mental status, unspecified: Secondary | ICD-10-CM | POA: Insufficient documentation

## 2015-06-06 DIAGNOSIS — F1410 Cocaine abuse, uncomplicated: Secondary | ICD-10-CM | POA: Diagnosis present

## 2015-06-06 DIAGNOSIS — G629 Polyneuropathy, unspecified: Secondary | ICD-10-CM | POA: Diagnosis present

## 2015-06-06 DIAGNOSIS — Z0189 Encounter for other specified special examinations: Secondary | ICD-10-CM | POA: Insufficient documentation

## 2015-06-06 DIAGNOSIS — J189 Pneumonia, unspecified organism: Secondary | ICD-10-CM

## 2015-06-06 DIAGNOSIS — E872 Acidosis: Secondary | ICD-10-CM | POA: Diagnosis present

## 2015-06-06 DIAGNOSIS — Z6841 Body Mass Index (BMI) 40.0 and over, adult: Secondary | ICD-10-CM

## 2015-06-06 DIAGNOSIS — E1121 Type 2 diabetes mellitus with diabetic nephropathy: Secondary | ICD-10-CM | POA: Diagnosis present

## 2015-06-06 DIAGNOSIS — I82612 Acute embolism and thrombosis of superficial veins of left upper extremity: Secondary | ICD-10-CM | POA: Diagnosis present

## 2015-06-06 DIAGNOSIS — G4733 Obstructive sleep apnea (adult) (pediatric): Secondary | ICD-10-CM | POA: Diagnosis present

## 2015-06-06 DIAGNOSIS — Z79899 Other long term (current) drug therapy: Secondary | ICD-10-CM

## 2015-06-06 DIAGNOSIS — J811 Chronic pulmonary edema: Secondary | ICD-10-CM

## 2015-06-06 DIAGNOSIS — R609 Edema, unspecified: Secondary | ICD-10-CM

## 2015-06-06 DIAGNOSIS — Z4659 Encounter for fitting and adjustment of other gastrointestinal appliance and device: Secondary | ICD-10-CM

## 2015-06-06 DIAGNOSIS — Z7982 Long term (current) use of aspirin: Secondary | ICD-10-CM | POA: Diagnosis not present

## 2015-06-06 DIAGNOSIS — E8770 Fluid overload, unspecified: Secondary | ICD-10-CM

## 2015-06-06 DIAGNOSIS — I5033 Acute on chronic diastolic (congestive) heart failure: Secondary | ICD-10-CM | POA: Diagnosis present

## 2015-06-06 DIAGNOSIS — R401 Stupor: Secondary | ICD-10-CM

## 2015-06-06 DIAGNOSIS — Z01818 Encounter for other preprocedural examination: Secondary | ICD-10-CM

## 2015-06-06 DIAGNOSIS — J9602 Acute respiratory failure with hypercapnia: Secondary | ICD-10-CM | POA: Diagnosis present

## 2015-06-06 LAB — POCT I-STAT 3, ART BLOOD GAS (G3+)
Acid-Base Excess: 2 mmol/L (ref 0.0–2.0)
BICARBONATE: 30.9 meq/L — AB (ref 20.0–24.0)
O2 SAT: 88 %
PCO2 ART: 63.4 mmHg — AB (ref 35.0–45.0)
Patient temperature: 98.6
TCO2: 33 mmol/L (ref 0–100)
pH, Arterial: 7.297 — ABNORMAL LOW (ref 7.350–7.450)
pO2, Arterial: 63 mmHg — ABNORMAL LOW (ref 80.0–100.0)

## 2015-06-06 LAB — CBC WITH DIFFERENTIAL/PLATELET
Basophils Absolute: 0 10*3/uL (ref 0.0–0.1)
Basophils Relative: 0 %
EOS ABS: 0 10*3/uL (ref 0.0–0.7)
EOS PCT: 0 %
HCT: 52.5 % — ABNORMAL HIGH (ref 39.0–52.0)
Hemoglobin: 16.7 g/dL (ref 13.0–17.0)
LYMPHS ABS: 1.2 10*3/uL (ref 0.7–4.0)
Lymphocytes Relative: 14 %
MCH: 29.9 pg (ref 26.0–34.0)
MCHC: 31.8 g/dL (ref 30.0–36.0)
MCV: 94.1 fL (ref 78.0–100.0)
MONO ABS: 0.7 10*3/uL (ref 0.1–1.0)
MONOS PCT: 8 %
Neutro Abs: 6.6 10*3/uL (ref 1.7–7.7)
Neutrophils Relative %: 78 %
PLATELETS: 167 10*3/uL (ref 150–400)
RBC: 5.58 MIL/uL (ref 4.22–5.81)
RDW: 14.2 % (ref 11.5–15.5)
WBC: 8.6 10*3/uL (ref 4.0–10.5)

## 2015-06-06 LAB — COMPREHENSIVE METABOLIC PANEL
ALBUMIN: 2.3 g/dL — AB (ref 3.5–5.0)
ALT: 12 U/L — ABNORMAL LOW (ref 17–63)
ANION GAP: 11 (ref 5–15)
AST: 16 U/L (ref 15–41)
Alkaline Phosphatase: 84 U/L (ref 38–126)
BILIRUBIN TOTAL: 0.2 mg/dL — AB (ref 0.3–1.2)
BUN: 25 mg/dL — ABNORMAL HIGH (ref 6–20)
CO2: 27 mmol/L (ref 22–32)
Calcium: 8.7 mg/dL — ABNORMAL LOW (ref 8.9–10.3)
Chloride: 101 mmol/L (ref 101–111)
Creatinine, Ser: 2.12 mg/dL — ABNORMAL HIGH (ref 0.61–1.24)
GFR calc non Af Amer: 35 mL/min — ABNORMAL LOW (ref >60)
GFR, EST AFRICAN AMERICAN: 40 mL/min — AB (ref >60)
GLUCOSE: 286 mg/dL — AB (ref 65–99)
POTASSIUM: 4.3 mmol/L (ref 3.5–5.1)
Sodium: 139 mmol/L (ref 135–145)
TOTAL PROTEIN: 5.6 g/dL — AB (ref 6.5–8.1)

## 2015-06-06 LAB — I-STAT ARTERIAL BLOOD GAS, ED
ACID-BASE EXCESS: 3 mmol/L — AB (ref 0.0–2.0)
Acid-Base Excess: 4 mmol/L — ABNORMAL HIGH (ref 0.0–2.0)
BICARBONATE: 32.6 meq/L — AB (ref 20.0–24.0)
Bicarbonate: 30.9 mEq/L — ABNORMAL HIGH (ref 20.0–24.0)
O2 SAT: 94 %
O2 Saturation: 86 %
PCO2 ART: 50.2 mmHg — AB (ref 35.0–45.0)
PH ART: 7.271 — AB (ref 7.350–7.450)
PH ART: 7.396 (ref 7.350–7.450)
PO2 ART: 74 mmHg — AB (ref 80.0–100.0)
Patient temperature: 98.6
TCO2: 35 mmol/L (ref 0–100)
TCO2: 32 mmol/L (ref 0–100)
pCO2 arterial: 70.8 mmHg (ref 35.0–45.0)
pO2, Arterial: 60 mmHg — ABNORMAL LOW (ref 80.0–100.0)

## 2015-06-06 LAB — I-STAT CHEM 8, ED
BUN: 43 mg/dL — ABNORMAL HIGH (ref 6–20)
Calcium, Ion: 1 mmol/L — ABNORMAL LOW (ref 1.12–1.23)
Chloride: 98 mmol/L — ABNORMAL LOW (ref 101–111)
Creatinine, Ser: 1.9 mg/dL — ABNORMAL HIGH (ref 0.61–1.24)
GLUCOSE: 303 mg/dL — AB (ref 65–99)
HCT: 57 % — ABNORMAL HIGH (ref 39.0–52.0)
HEMOGLOBIN: 19.4 g/dL — AB (ref 13.0–17.0)
POTASSIUM: 6.7 mmol/L — AB (ref 3.5–5.1)
SODIUM: 135 mmol/L (ref 135–145)
TCO2: 31 mmol/L (ref 0–100)

## 2015-06-06 LAB — MRSA PCR SCREENING: MRSA BY PCR: NEGATIVE

## 2015-06-06 LAB — BRAIN NATRIURETIC PEPTIDE: B Natriuretic Peptide: 295 pg/mL — ABNORMAL HIGH (ref 0.0–100.0)

## 2015-06-06 LAB — I-STAT TROPONIN, ED: TROPONIN I, POC: 0.03 ng/mL (ref 0.00–0.08)

## 2015-06-06 LAB — GLUCOSE, CAPILLARY
Glucose-Capillary: 276 mg/dL — ABNORMAL HIGH (ref 65–99)
Glucose-Capillary: 333 mg/dL — ABNORMAL HIGH (ref 65–99)

## 2015-06-06 LAB — I-STAT CG4 LACTIC ACID, ED: LACTIC ACID, VENOUS: 0.99 mmol/L (ref 0.5–2.0)

## 2015-06-06 MED ORDER — PROPOFOL BOLUS VIA INFUSION
0.5000 mg/kg | INTRAVENOUS | Status: DC | PRN
  Filled 2015-06-06: qty 40

## 2015-06-06 MED ORDER — PROPOFOL 1000 MG/100ML IV EMUL
5.0000 ug/kg/min | INTRAVENOUS | Status: DC
  Administered 2015-06-06: 40 ug/kg/min via INTRAVENOUS
  Administered 2015-06-06 (×2): 50 ug/kg/min via INTRAVENOUS
  Administered 2015-06-07 (×3): 40 ug/kg/min via INTRAVENOUS
  Administered 2015-06-07 (×2): 30 ug/kg/min via INTRAVENOUS
  Administered 2015-06-07: 15 ug/kg/min via INTRAVENOUS
  Administered 2015-06-08 (×2): 60 ug/kg/min via INTRAVENOUS
  Administered 2015-06-08: 50 ug/kg/min via INTRAVENOUS
  Administered 2015-06-08: 60 ug/kg/min via INTRAVENOUS
  Administered 2015-06-08: 40 ug/kg/min via INTRAVENOUS
  Administered 2015-06-08: 30 ug/kg/min via INTRAVENOUS
  Administered 2015-06-08 (×2): 40 ug/kg/min via INTRAVENOUS
  Administered 2015-06-08: 60 ug/kg/min via INTRAVENOUS
  Administered 2015-06-08: 40 ug/kg/min via INTRAVENOUS
  Administered 2015-06-09: 30 ug/kg/min via INTRAVENOUS
  Administered 2015-06-09 – 2015-06-10 (×12): 60 ug/kg/min via INTRAVENOUS
  Filled 2015-06-06: qty 200
  Filled 2015-06-06 (×6): qty 100
  Filled 2015-06-06 (×2): qty 200
  Filled 2015-06-06 (×3): qty 100
  Filled 2015-06-06: qty 200
  Filled 2015-06-06: qty 100
  Filled 2015-06-06 (×3): qty 200
  Filled 2015-06-06 (×2): qty 100
  Filled 2015-06-06: qty 200
  Filled 2015-06-06: qty 100
  Filled 2015-06-06: qty 200
  Filled 2015-06-06 (×4): qty 100

## 2015-06-06 MED ORDER — AMITRIPTYLINE HCL 25 MG PO TABS: 50.0000 mg | ORAL_TABLET | Freq: Every day | ORAL | Status: DC

## 2015-06-06 MED ORDER — FENTANYL CITRATE (PF) 100 MCG/2ML IJ SOLN
50.0000 ug | INTRAMUSCULAR | Status: DC | PRN
  Administered 2015-06-06 – 2015-06-07 (×6): 100 ug via INTRAVENOUS
  Administered 2015-06-08: 50 ug via INTRAVENOUS
  Administered 2015-06-08 (×2): 100 ug via INTRAVENOUS
  Administered 2015-06-08 – 2015-06-09 (×2): 50 ug via INTRAVENOUS
  Administered 2015-06-09: 100 ug via INTRAVENOUS
  Administered 2015-06-09: 50 ug via INTRAVENOUS
  Administered 2015-06-09 – 2015-06-10 (×3): 100 ug via INTRAVENOUS
  Filled 2015-06-06 (×16): qty 2

## 2015-06-06 MED ORDER — MIDAZOLAM HCL 2 MG/2ML IJ SOLN
2.0000 mg | Freq: Once | INTRAMUSCULAR | Status: AC
  Administered 2015-06-06: 2 mg via INTRAVENOUS

## 2015-06-06 MED ORDER — ROCURONIUM BROMIDE 50 MG/5ML IV SOLN
100.0000 mg | Freq: Once | INTRAVENOUS | Status: AC
  Administered 2015-06-06: 100 mg via INTRAVENOUS
  Filled 2015-06-06: qty 10

## 2015-06-06 MED ORDER — INSULIN ASPART 100 UNIT/ML ~~LOC~~ SOLN
0.0000 [IU] | SUBCUTANEOUS | Status: DC
  Administered 2015-06-06 – 2015-06-07 (×2): 7 [IU] via SUBCUTANEOUS
  Administered 2015-06-07 (×4): 3 [IU] via SUBCUTANEOUS
  Administered 2015-06-07: 5 [IU] via SUBCUTANEOUS
  Administered 2015-06-08: 2 [IU] via SUBCUTANEOUS
  Administered 2015-06-08 (×4): 1 [IU] via SUBCUTANEOUS
  Administered 2015-06-09: 5 [IU] via SUBCUTANEOUS
  Administered 2015-06-09 – 2015-06-10 (×7): 2 [IU] via SUBCUTANEOUS
  Administered 2015-06-10: 1 [IU] via SUBCUTANEOUS
  Administered 2015-06-11: 5 [IU] via SUBCUTANEOUS
  Administered 2015-06-11: 9 [IU] via SUBCUTANEOUS
  Administered 2015-06-11: 2 [IU] via SUBCUTANEOUS
  Administered 2015-06-11: 5 [IU] via SUBCUTANEOUS
  Administered 2015-06-11: 1 [IU] via SUBCUTANEOUS
  Administered 2015-06-11: 3 [IU] via SUBCUTANEOUS
  Administered 2015-06-11: 2 [IU] via SUBCUTANEOUS
  Administered 2015-06-12: 7 [IU] via SUBCUTANEOUS
  Administered 2015-06-12: 5 [IU] via SUBCUTANEOUS
  Administered 2015-06-12: 2 [IU] via SUBCUTANEOUS
  Administered 2015-06-12 – 2015-06-13 (×2): 7 [IU] via SUBCUTANEOUS

## 2015-06-06 MED ORDER — FENOFIBRATE 160 MG PO TABS: 160.0000 mg | ORAL_TABLET | Freq: Every day | ORAL | Status: DC

## 2015-06-06 MED ORDER — SUCCINYLCHOLINE CHLORIDE 20 MG/ML IJ SOLN
INTRAMUSCULAR | Status: AC
  Filled 2015-06-06: qty 1

## 2015-06-06 MED ORDER — SODIUM CHLORIDE 0.9 % IV SOLN
INTRAVENOUS | Status: DC
  Administered 2015-06-06: 13:00:00 via INTRAVENOUS

## 2015-06-06 MED ORDER — ONDANSETRON HCL 4 MG/2ML IJ SOLN
4.0000 mg | Freq: Once | INTRAMUSCULAR | Status: AC
  Administered 2015-06-06: 4 mg via INTRAVENOUS
  Filled 2015-06-06: qty 2

## 2015-06-06 MED ORDER — FENOFIBRATE 160 MG PO TABS
160.0000 mg | ORAL_TABLET | Freq: Every day | ORAL | Status: DC
  Administered 2015-06-07 – 2015-06-11 (×5): 160 mg
  Filled 2015-06-06 (×5): qty 1

## 2015-06-06 MED ORDER — GABAPENTIN 300 MG PO CAPS: 300.0000 mg | ORAL_CAPSULE | Freq: Three times a day (TID) | ORAL | Status: DC

## 2015-06-06 MED ORDER — FUROSEMIDE 10 MG/ML IJ SOLN
80.0000 mg | Freq: Once | INTRAMUSCULAR | Status: AC
  Administered 2015-06-06: 80 mg via INTRAVENOUS
  Filled 2015-06-06: qty 8

## 2015-06-06 MED ORDER — MIDAZOLAM HCL 2 MG/2ML IJ SOLN
INTRAMUSCULAR | Status: AC
  Filled 2015-06-06: qty 2

## 2015-06-06 MED ORDER — PANTOPRAZOLE SODIUM 40 MG IV SOLR
40.0000 mg | Freq: Every day | INTRAVENOUS | Status: DC
Start: 2015-06-06 — End: 2015-06-09
  Administered 2015-06-06 – 2015-06-08 (×3): 40 mg via INTRAVENOUS
  Filled 2015-06-06 (×4): qty 40

## 2015-06-06 MED ORDER — FENTANYL CITRATE (PF) 100 MCG/2ML IJ SOLN
50.0000 ug | Freq: Once | INTRAMUSCULAR | Status: AC
  Administered 2015-06-06: 50 ug via INTRAVENOUS
  Filled 2015-06-06: qty 2

## 2015-06-06 MED ORDER — ANTISEPTIC ORAL RINSE SOLUTION (CORINZ)
7.0000 mL | Freq: Four times a day (QID) | OROMUCOSAL | Status: DC
  Administered 2015-06-07 – 2015-06-14 (×29): 7 mL via OROMUCOSAL

## 2015-06-06 MED ORDER — NICARDIPINE HCL IN NACL 20-0.86 MG/200ML-% IV SOLN
3.0000 mg/h | INTRAVENOUS | Status: DC
  Administered 2015-06-06: 5 mg/h via INTRAVENOUS
  Administered 2015-06-07 (×2): 2.5 mg/h via INTRAVENOUS
  Filled 2015-06-06 (×3): qty 200

## 2015-06-06 MED ORDER — CHLORHEXIDINE GLUCONATE 0.12% ORAL RINSE (MEDLINE KIT)
15.0000 mL | Freq: Two times a day (BID) | OROMUCOSAL | Status: DC
  Administered 2015-06-07 – 2015-06-14 (×12): 15 mL via OROMUCOSAL

## 2015-06-06 MED ORDER — FUROSEMIDE 10 MG/ML IJ SOLN
40.0000 mg | Freq: Three times a day (TID) | INTRAMUSCULAR | Status: DC
  Administered 2015-06-06 – 2015-06-09 (×6): 40 mg via INTRAVENOUS
  Filled 2015-06-06 (×10): qty 4

## 2015-06-06 MED ORDER — GABAPENTIN 300 MG PO CAPS
300.0000 mg | ORAL_CAPSULE | Freq: Three times a day (TID) | ORAL | Status: DC
  Filled 2015-06-06: qty 1

## 2015-06-06 MED ORDER — PROPOFOL 1000 MG/100ML IV EMUL
5.0000 ug/kg/min | Freq: Once | INTRAVENOUS | Status: AC
  Administered 2015-06-06: 10 ug/kg/min via INTRAVENOUS

## 2015-06-06 MED ORDER — VANCOMYCIN HCL 10 G IV SOLR
2500.0000 mg | Freq: Once | INTRAVENOUS | Status: DC
  Filled 2015-06-06: qty 2500

## 2015-06-06 MED ORDER — ONDANSETRON HCL 4 MG/2ML IJ SOLN
4.0000 mg | Freq: Four times a day (QID) | INTRAMUSCULAR | Status: DC | PRN
Start: 2015-06-06 — End: 2015-06-14

## 2015-06-06 MED ORDER — GABAPENTIN 250 MG/5ML PO SOLN
300.0000 mg | Freq: Three times a day (TID) | ORAL | Status: DC
  Administered 2015-06-06 – 2015-06-11 (×12): 300 mg
  Filled 2015-06-06 (×18): qty 6

## 2015-06-06 MED ORDER — SODIUM CHLORIDE 0.9 % IV SOLN: INTRAVENOUS | Status: AC

## 2015-06-06 MED ORDER — ATORVASTATIN CALCIUM 80 MG PO TABS: 80.0000 mg | ORAL_TABLET | Freq: Every day | ORAL | Status: DC

## 2015-06-06 MED ORDER — ETOMIDATE 2 MG/ML IV SOLN
20.0000 mg | Freq: Once | INTRAVENOUS | Status: AC
  Administered 2015-06-06: 20 mg via INTRAVENOUS

## 2015-06-06 MED ORDER — INSULIN ASPART 100 UNIT/ML ~~LOC~~ SOLN
10.0000 [IU] | Freq: Once | SUBCUTANEOUS | Status: AC
  Administered 2015-06-06: 10 [IU] via INTRAVENOUS
  Filled 2015-06-06: qty 1

## 2015-06-06 MED ORDER — HYDRALAZINE HCL 100 MG PO TABS: 25.0000 mg | ORAL_TABLET | Freq: Three times a day (TID) | ORAL | Status: DC

## 2015-06-06 MED ORDER — INSULIN GLARGINE 100 UNIT/ML ~~LOC~~ SOLN
20.0000 [IU] | Freq: Two times a day (BID) | SUBCUTANEOUS | Status: DC
  Administered 2015-06-06 – 2015-06-09 (×7): 20 [IU] via SUBCUTANEOUS
  Filled 2015-06-06 (×10): qty 0.2

## 2015-06-06 MED ORDER — IPRATROPIUM-ALBUTEROL 0.5-2.5 (3) MG/3ML IN SOLN
3.0000 mL | Freq: Four times a day (QID) | RESPIRATORY_TRACT | Status: DC
  Administered 2015-06-06 – 2015-06-14 (×31): 3 mL via RESPIRATORY_TRACT
  Filled 2015-06-06 (×30): qty 3

## 2015-06-06 MED ORDER — VANCOMYCIN HCL IN DEXTROSE 1-5 GM/200ML-% IV SOLN: 1000.0000 mg | Freq: Two times a day (BID) | INTRAVENOUS | Status: DC

## 2015-06-06 MED ORDER — SODIUM CHLORIDE 0.9 % IV SOLN: INTRAVENOUS | Status: DC

## 2015-06-06 MED ORDER — PROPOFOL 1000 MG/100ML IV EMUL
INTRAVENOUS | Status: AC
  Filled 2015-06-06: qty 100

## 2015-06-06 MED ORDER — AMITRIPTYLINE HCL 50 MG PO TABS
50.0000 mg | ORAL_TABLET | Freq: Every day | ORAL | Status: DC
  Administered 2015-06-06 – 2015-06-08 (×3): 50 mg
  Filled 2015-06-06 (×4): qty 1

## 2015-06-06 MED ORDER — ROCURONIUM BROMIDE 50 MG/5ML IV SOLN
INTRAVENOUS | Status: AC
  Filled 2015-06-06: qty 2

## 2015-06-06 MED ORDER — ETOMIDATE 2 MG/ML IV SOLN
INTRAVENOUS | Status: AC
  Filled 2015-06-06: qty 20

## 2015-06-06 MED ORDER — DEXTROSE 5 % IV SOLN
2.0000 g | Freq: Three times a day (TID) | INTRAVENOUS | Status: DC
  Filled 2015-06-06 (×3): qty 2

## 2015-06-06 MED ORDER — HYDRALAZINE HCL 25 MG PO TABS
25.0000 mg | ORAL_TABLET | Freq: Three times a day (TID) | ORAL | Status: DC
  Administered 2015-06-06 – 2015-06-07 (×4): 25 mg
  Filled 2015-06-06 (×7): qty 1

## 2015-06-06 MED ORDER — ATORVASTATIN CALCIUM 80 MG PO TABS
80.0000 mg | ORAL_TABLET | Freq: Every day | ORAL | Status: DC
  Administered 2015-06-06 – 2015-06-10 (×5): 80 mg
  Filled 2015-06-06 (×6): qty 1

## 2015-06-06 MED ORDER — FENTANYL CITRATE (PF) 100 MCG/2ML IJ SOLN
INTRAMUSCULAR | Status: AC
  Filled 2015-06-06: qty 2

## 2015-06-06 MED ORDER — SODIUM CHLORIDE 0.9 % IV SOLN
1.0000 g | Freq: Once | INTRAVENOUS | Status: DC
  Filled 2015-06-06 (×2): qty 10

## 2015-06-06 MED ORDER — SODIUM CHLORIDE 0.9 % IV SOLN: 250.0000 mL | INTRAVENOUS | Status: DC | PRN

## 2015-06-06 MED ORDER — HYDRALAZINE HCL 100 MG PO TABS: 100.0000 mg | ORAL_TABLET | Freq: Three times a day (TID) | ORAL | Status: DC

## 2015-06-06 MED ORDER — NITROGLYCERIN IN D5W 200-5 MCG/ML-% IV SOLN
0.0000 ug/min | Freq: Once | INTRAVENOUS | Status: AC
  Administered 2015-06-06: 5 ug/min via INTRAVENOUS
  Filled 2015-06-06: qty 250

## 2015-06-06 MED ORDER — LIDOCAINE HCL (CARDIAC) 20 MG/ML IV SOLN
INTRAVENOUS | Status: AC
  Filled 2015-06-06: qty 5

## 2015-06-06 NOTE — ED Provider Notes (Signed)
CSN: [id number]     Arrival date & time 06/06/15  1258 History   First MD Initiated Contact with Patient 06/06/15 1315     Chief Complaint  Patient presents with  . Shortness of Breath     (Consider location/radiation/quality/duration/timing/severity/associated sxs/prior Treatment) Patient is a 49 y.o. male presenting with shortness of breath. The history is provided by the patient and the EMS personnel.  Shortness of Breath Associated symptoms: no abdominal pain, no chest pain, no fever, no headaches and no rash    patient brought in from home. Respiratory distress started to feel significant short of breath yesterday. Patient known to have history of CHF. Patient uses C Pap at night only not normally on oxygen. Patient denied any chest pain. Just the shortness of breath. EMS had sats at home 81%. Patient was in the tripod position. EMS started him on Cipro. Initially was on non-rebreather without any improvement then advance to see Pap. On that his sats came up to 93%.   Patient with recent admission by family medicine and not September for similar complaints. Patient admitted a been noncompliant with his medications.  Past Medical History  Diagnosis Date  . Hypertension   . Hyperlipidemia   . Meralgia paraesthetica 05/03/2012  . Lumbar spondylosis 05/03/2012  . Lumbar spinal stenosis 05/03/2012    Mild with only right L4 nerve root encroachment, no neurogenic claudication   . Urinary incontinence 05/12/2012    Since the start of Sept. 2013   . Hemorrhoids 05/12/2012  . UTI (lower urinary tract infection)   . Meralgia paraesthetica 05/03/2012    On Lyrica which does improve pain.   Marland Kitchen Neuropathy in diabetes ([place]) 05/12/2012  . Substance abuse   . Hyperlipidemia   . Pneumonia   . Diabetes mellitus   . Diabetes mellitus with nephropathy ([place]) 05/12/2012  . Acute diastolic heart failure ([place])   . Retinopathy   . CHF (congestive heart failure) ([place])   . Obesity    Past Surgical History   Procedure Laterality Date  . Tonsillectomy    . Abdominal surgery      Abscess I&D 2/2 infected hair  . Colonoscopy w/ polypectomy      pt to bring records   Family History  Problem Relation Age of Onset  . Prostate cancer Father   . Alcohol abuse Father   . Emphysema Father     smoked   Social History  Substance Use Topics  . Smoking status: Current Every Day Smoker -- 0.40 packs/day for 30 years    Types: Cigarettes  . Smokeless tobacco: Never Used     Comment: "Trying - hard to stop"  . Alcohol Use: 3.6 oz/week    6 Cans of beer per week     Comment: beer    Review of Systems  Constitutional: Negative for fever.  HENT: Negative for congestion.   Eyes: Negative for visual disturbance.  Respiratory: Positive for shortness of breath.   Cardiovascular: Negative for chest pain.  Gastrointestinal: Negative for abdominal pain.  Genitourinary: Negative for dysuria.  Musculoskeletal: Negative for back pain.  Skin: Negative for rash.  Neurological: Negative for headaches.  Hematological: Does not bruise/bleed easily.  Psychiatric/Behavioral: Negative for confusion.      Allergies  Review of patient's allergies indicates no known allergies.  Home Medications   Prior to Admission medications   Medication Sig Start Date End Date Taking? Authorizing Provider  albuterol (PROVENTIL HFA;VENTOLIN HFA) 108 (90 BASE) MCG/ACT inhaler Inhale 2 puffs  into the lungs every 4 (four) hours as needed for wheezing or shortness of breath. 02/05/15  Yes Tiffany Carlota Raspberry, PA-C  amitriptyline (ELAVIL) 50 MG tablet Take 1 tablet (50 mg total) by mouth at bedtime. 02/05/15  Yes Tiffany Carlota Raspberry, PA-C  amitriptyline (ELAVIL) 50 MG tablet Take 50 mg by mouth. 04/24/15  Yes Historical Provider, MD  aspirin 325 MG tablet Take 1 tablet (325 mg total) by mouth daily. 02/05/15  Yes Tiffany Carlota Raspberry, PA-C  atorvastatin (LIPITOR) 80 MG tablet Take 1 tablet (80 mg total) by mouth daily. 05/18/15  Yes Patrecia Pour, MD   carvedilol (COREG) 25 MG tablet Take 1 tablet (25 mg total) by mouth 2 (two) times daily with a meal. 02/05/15  Yes Delos Haring, PA-C  cloNIDine (CATAPRES) 0.2 MG tablet Take 1 tablet (0.2 mg total) by mouth 3 (three) times daily. 02/05/15  Yes Tiffany Carlota Raspberry, PA-C  fenofibrate 160 MG tablet Take 1 tablet (160 mg total) by mouth daily. 05/18/15  Yes Patrecia Pour, MD  fluticasone (FLONASE) 50 MCG/ACT nasal spray Place 1-2 sprays into both nostrils 2 (two) times daily. 02/05/15  Yes Tiffany Carlota Raspberry, PA-C  furosemide (LASIX) 80 MG tablet Take 1 tablet (80 mg total) by mouth daily as needed for edema. 02/05/15  Yes Tiffany Carlota Raspberry, PA-C  gabapentin (NEURONTIN) 300 MG capsule Take 1 capsule (300 mg total) by mouth 3 (three) times daily. 200 mg in the morning and 100 mg in the evening. 02/05/15  Yes Tiffany Carlota Raspberry, PA-C  hydrALAZINE (APRESOLINE) 100 MG tablet Take 1 tablet (100 mg total) by mouth 3 (three) times daily. 02/05/15  Yes Tiffany Carlota Raspberry, PA-C  insulin aspart (NOVOLOG) 100 UNIT/ML injection Inject 10-30 Units into the skin 3 (three) times daily before meals. Per sliding scale 02/05/15  Yes Tiffany Carlota Raspberry, PA-C  insulin glargine (LANTUS) 100 UNIT/ML injection Inject 0.4 mLs (40 Units total) into the skin 2 (two) times daily. Patient taking differently: Inject 35-47 Units into the skin 2 (two) times daily. Take 47 units in the morning, and 35 units at bedtime 02/05/15  Yes Tiffany Carlota Raspberry, PA-C  nicotine (NICODERM CQ - DOSED IN MG/24 HOURS) 21 mg/24hr patch Place 1 patch (21 mg total) onto the skin daily. 01/22/15  Yes Debbe Odea, MD  omega-3 acid ethyl esters (LOVAZA) 1 G capsule Take 2 g by mouth 2 (two) times daily.    Yes Historical Provider, MD  Polyvinyl Alcohol-Povidone (REFRESH OP) Place 2 drops into both eyes 3 (three) times daily as needed (dry eyes).   Yes Historical Provider, MD  potassium chloride 20 MEQ TBCR take 2 tabs (38mEq) for 6 days, then 1 tab (78mEq) daily 05/18/15  Yes Patrecia Pour, MD   verapamil (CALAN-SR) 120 MG CR tablet Take 1 tablet (120 mg total) by mouth at bedtime. 02/05/15  Yes Delos Haring, PA-C  glucose blood test strip Use as instructed 02/05/15   Delos Haring, PA-C  nicotine (NICODERM CQ) 14 mg/24hr patch Place 1 patch (14 mg total) onto the skin daily. 02/04/15   Debbe Odea, MD   BP 244/152 mmHg  Pulse 96  Temp(Src) 98 F (36.7 C) (Axillary)  Resp 21  Ht 6' 0.83" (1.85 m)  Wt 300 lb 0.7 oz (136.1 kg)  BMI 39.77 kg/m2  SpO2 97% Physical Exam  Constitutional: He is oriented to person, place, and time. He appears well-developed and well-nourished. No distress.  HENT:  Head: Normocephalic and atraumatic.  Mouth/Throat: Oropharynx is clear and moist.  Eyes: Conjunctivae are  normal. Pupils are equal, round, and reactive to light.  Neck: Normal range of motion.  Cardiovascular: Normal rate, regular rhythm and normal heart sounds.   No murmur heard. Pulmonary/Chest: He is in respiratory distress. He has rales.  Abdominal: Soft. Bowel sounds are normal. There is no tenderness.  Musculoskeletal: Normal range of motion. He exhibits edema.  Neurological: He is alert and oriented to person, place, and time. No cranial nerve deficit. He exhibits normal muscle tone. Coordination normal.  Skin: Skin is warm. No rash noted.  Nursing note and vitals reviewed.   ED Course  Procedures (including critical care time) Labs Review Labs Reviewed  BRAIN NATRIURETIC PEPTIDE - Abnormal; Notable for the following:    B Natriuretic Peptide 295.0 (*)    All other components within normal limits  CBC WITH DIFFERENTIAL/PLATELET - Abnormal; Notable for the following:    HCT 52.5 (*)    All other components within normal limits  I-STAT CHEM 8, ED - Abnormal; Notable for the following:    Potassium 6.7 (*)    Chloride 98 (*)    BUN 43 (*)    Creatinine, Ser 1.90 (*)    Glucose, Bld 303 (*)    Calcium, Ion 1.00 (*)    Hemoglobin 19.4 (*)    HCT 57.0 (*)    All other  components within normal limits  I-STAT ARTERIAL BLOOD GAS, ED - Abnormal; Notable for the following:    pH, Arterial 7.271 (*)    pCO2 arterial 70.8 (*)    pO2, Arterial 60.0 (*)    Bicarbonate 32.6 (*)    Acid-Base Excess 3.0 (*)    All other components within normal limits  CULTURE, BLOOD (ROUTINE X 2)  CULTURE, BLOOD (ROUTINE X 2)  COMPREHENSIVE METABOLIC PANEL  I-STAT TROPOININ, ED  I-STAT CG4 LACTIC ACID, ED  I-STAT ARTERIAL BLOOD GAS, ED   Results for orders placed or performed during the hospital encounter of 06/06/15  Brain natriuretic peptide  Result Value Ref Range   B Natriuretic Peptide 295.0 (H) 0.0 - 100.0 pg/mL  CBC with Differential/Platelet  Result Value Ref Range   WBC 8.6 4.0 - 10.5 K/uL   RBC 5.58 4.22 - 5.81 MIL/uL   Hemoglobin 16.7 13.0 - 17.0 g/dL   HCT 52.5 (H) 39.0 - 52.0 %   MCV 94.1 78.0 - 100.0 fL   MCH 29.9 26.0 - 34.0 pg   MCHC 31.8 30.0 - 36.0 g/dL   RDW 14.2 11.5 - 15.5 %   Platelets 167 150 - 400 K/uL   Neutrophils Relative % 78 %   Neutro Abs 6.6 1.7 - 7.7 K/uL   Lymphocytes Relative 14 %   Lymphs Abs 1.2 0.7 - 4.0 K/uL   Monocytes Relative 8 %   Monocytes Absolute 0.7 0.1 - 1.0 K/uL   Eosinophils Relative 0 %   Eosinophils Absolute 0.0 0.0 - 0.7 K/uL   Basophils Relative 0 %   Basophils Absolute 0.0 0.0 - 0.1 K/uL  I-Stat Chem 8, ED  Result Value Ref Range   Sodium 135 135 - 145 mmol/L   Potassium 6.7 (HH) 3.5 - 5.1 mmol/L   Chloride 98 (L) 101 - 111 mmol/L   BUN 43 (H) 6 - 20 mg/dL   Creatinine, Ser 1.90 (H) 0.61 - 1.24 mg/dL   Glucose, Bld 303 (H) 65 - 99 mg/dL   Calcium, Ion 1.00 (L) 1.12 - 1.23 mmol/L   TCO2 31 0 - 100 mmol/L   Hemoglobin 19.4 (  H) 13.0 - 17.0 g/dL   HCT 57.0 (H) 39.0 - 52.0 %   Comment NOTIFIED PHYSICIAN   I-Stat Troponin, ED (not at [facility])  Result Value Ref Range   Troponin i, poc 0.03 0.00 - 0.08 ng/mL   Comment 3          I-Stat Arterial Blood Gas, ED - (order at [facility] and [facility] only)  Result Value Ref  Range   pH, Arterial 7.271 (L) 7.350 - 7.450   pCO2 arterial 70.8 (HH) 35.0 - 45.0 mmHg   pO2, Arterial 60.0 (L) 80.0 - 100.0 mmHg   Bicarbonate 32.6 (H) 20.0 - 24.0 mEq/L   TCO2 35 0 - 100 mmol/L   O2 Saturation 86.0 %   Acid-Base Excess 3.0 (H) 0.0 - 2.0 mmol/L   Patient temperature 98.6 F    Collection site RADIAL, ALLEN'S TEST ACCEPTABLE    Sample type ARTERIAL    Comment NOTIFIED PHYSICIAN   I-Stat CG4 Lactic Acid, ED  Result Value Ref Range   Lactic Acid, Venous 0.99 0.5 - 2.0 mmol/L     Imaging Review Dg Chest Port 1 View  06/06/2015   CLINICAL DATA:  Shortness of Breath for 2 days. History of congestive heart failure  EXAM: PORTABLE CHEST 1 VIEW  COMPARISON:  Chest radiograph and chest CT May 15, 2015  FINDINGS: There is interstitial edema with cardiomegaly and pulmonary venous hypertension. There is consolidation in the right base with right effusion. There is elevation of the right hemidiaphragm as well. No adenopathy.  IMPRESSION: Evidence of congestive heart failure. Suspect superimposed pneumonia right base. Stable elevation right hemidiaphragm.   Electronically Signed   By: Lowella Grip III M.D.   On: 06/06/2015 13:57   I have personally reviewed and evaluated these images and lab results as part of my medical decision-making.   EKG Interpretation   Date/Time:  Thursday June 06 2015 13:07:16 EDT Ventricular Rate:  79 PR Interval:  159 QRS Duration: 114 QT Interval:  429 QTC Calculation: 492 R Axis:   -48 Text Interpretation:  Sinus rhythm Left anterior fascicular block Probable  anteroseptal infarct, old Confirmed by Jolan Mealor  MD, Louie Flenner [phone]) on  06/06/2015 3:56:41 PM      CRITICAL CARE Performed by: Fredia Sorrow Total critical care time: 30 Critical care time was exclusive of separately billable procedures and treating other patients. Critical care was necessary to treat or prevent imminent or life-threatening deterioration. Critical care  was time spent personally by me on the following activities: development of treatment plan with patient and/or surrogate as well as nursing, discussions with consultants, evaluation of patient's response to treatment, examination of patient, obtaining history from patient or surrogate, ordering and performing treatments and interventions, ordering and review of laboratory studies, ordering and review of radiographic studies, pulse oximetry and re-evaluation of patient's condition.     MDM   Final diagnoses:  Acute congestive heart failure, unspecified congestive heart failure type ([place])  HCAP (healthcare-associated pneumonia)  Hyperkalemia  Hyperglycemia  Acute respiratory failure with hypercapnia ([place])   Patient brought in by EMS with significant hypoxia in the field. Patient was satting around 77%. Patient started on C Pap by them. Arrived here with some improvement but still feeling short of breath. Oxygen saturations were better they were above 90%. Patient had a history of CHF clinically based on leg swelling and rales felt that it was probably in congestive heart failure. Patient's blood pressure was extremely high. Systolics were in the [id number]. Patient started  on nitroglycerin drip and received 80 of Lasix. Patient had admission for shortness of breath edema and fatigue September 12. Chest x-ray at that time raise concerns for pneumonia. Today's chest x-ray also raises concern for pneumonia also showed evidence of congestive heart failure. Patient's initial blood gas showed a PCO2 of 62, repeat after being on BiPAP even though patient was feeling better he was getting more drowsy PCO2 went up to 70. Patient would require intubation. Patient moved to the trauma room and intubated using etomidate and Dr. Valma Cava on because of the concerns for the elevated potassium.  Patient's initial potassium was in the 6 range although QRSs were not particularly widened. Patient received calcium gluconate and  also received insulin because blood sugar was 3:30 to drive the potassium down. Complete metabolic panel was still pending.  In addition patient's chest x-ray does raise concerns for pneumonia. Patient was started on H Anna bodyaches but as stated above this may just be up pulmonary mass but keeps showing up. Blood cultures done. Patient clinically not showing signs of sepsis. Never hypotensive no fever.  Prior to knowing the patient was going to need to be intubated had consult with family practice for admission. But when his respiratory status deteriorated critical care contacted and they will admit. Patient is now on a propofol drip.  Patient's initial troponin was normal.    Fredia Sorrow, MD 06/06/15 [phone]

## 2015-06-06 NOTE — H&P (Signed)
PULMONARY / CRITICAL CARE MEDICINE   Name: Jerry Cantrell MRN: [id number] DOB: 07-Mar-1966    ADMISSION DATE:  06/06/2015  REFERRING MD :  EDP  CHIEF COMPLAINT:  Respiratory failure   INITIAL PRESENTATION:  48yo male with hx HTN, dCHF, OSA, cocaine abuse recent d/c 9/17 after admission for CHF exacerbation returns 10/6 with increased SOB.  Initially tried on bipap in ER but failed requiring intubation.  PCCM consulted for admission.   STUDIES:  2D echo 10/6>>>  SIGNIFICANT EVENTS:    HISTORY OF PRESENT ILLNESS: 49yo male with hx HTN, dCHF, OSA, cocaine abuse recent d/c 9/17 after admission for CHF exacerbation returns 10/6 with increased SOB.  Initially tried on bipap in ER but failed requiring intubation.  PCCM consulted for admission.    PAST MEDICAL HISTORY :   has a past medical history of Hypertension; Hyperlipidemia; Meralgia paraesthetica (05/03/2012); Lumbar spondylosis (05/03/2012); Lumbar spinal stenosis (05/03/2012); Urinary incontinence (05/12/2012); Hemorrhoids (05/12/2012); UTI (lower urinary tract infection); Meralgia paraesthetica (05/03/2012); Neuropathy in diabetes ([place]) (05/12/2012); Substance abuse; Hyperlipidemia; Pneumonia; Diabetes mellitus; Diabetes mellitus with nephropathy ([place]) (05/12/2012); Acute diastolic heart failure ([place]); Retinopathy; CHF (congestive heart failure) ([place]); and Obesity.  has past surgical history that includes Tonsillectomy; Abdominal surgery; and Colonoscopy w/ polypectomy. Prior to Admission medications   Medication Sig Start Date End Date Taking? Authorizing Provider  albuterol (PROVENTIL HFA;VENTOLIN HFA) 108 (90 BASE) MCG/ACT inhaler Inhale 2 puffs into the lungs every 4 (four) hours as needed for wheezing or shortness of breath. 02/05/15  Yes Tiffany Carlota Raspberry, PA-C  amitriptyline (ELAVIL) 50 MG tablet Take 1 tablet (50 mg total) by mouth at bedtime. 02/05/15  Yes Tiffany Carlota Raspberry, PA-C  amitriptyline (ELAVIL) 50 MG tablet Take 50 mg by mouth. 04/24/15  Yes  Historical Provider, MD  aspirin 325 MG tablet Take 1 tablet (325 mg total) by mouth daily. 02/05/15  Yes Tiffany Carlota Raspberry, PA-C  atorvastatin (LIPITOR) 80 MG tablet Take 1 tablet (80 mg total) by mouth daily. 05/18/15  Yes Patrecia Pour, MD  carvedilol (COREG) 25 MG tablet Take 1 tablet (25 mg total) by mouth 2 (two) times daily with a meal. 02/05/15  Yes Delos Haring, PA-C  cloNIDine (CATAPRES) 0.2 MG tablet Take 1 tablet (0.2 mg total) by mouth 3 (three) times daily. 02/05/15  Yes Tiffany Carlota Raspberry, PA-C  fenofibrate 160 MG tablet Take 1 tablet (160 mg total) by mouth daily. 05/18/15  Yes Patrecia Pour, MD  fluticasone (FLONASE) 50 MCG/ACT nasal spray Place 1-2 sprays into both nostrils 2 (two) times daily. 02/05/15  Yes Tiffany Carlota Raspberry, PA-C  furosemide (LASIX) 80 MG tablet Take 1 tablet (80 mg total) by mouth daily as needed for edema. 02/05/15  Yes Tiffany Carlota Raspberry, PA-C  gabapentin (NEURONTIN) 300 MG capsule Take 1 capsule (300 mg total) by mouth 3 (three) times daily. 200 mg in the morning and 100 mg in the evening. 02/05/15  Yes Tiffany Carlota Raspberry, PA-C  hydrALAZINE (APRESOLINE) 100 MG tablet Take 1 tablet (100 mg total) by mouth 3 (three) times daily. 02/05/15  Yes Tiffany Carlota Raspberry, PA-C  insulin aspart (NOVOLOG) 100 UNIT/ML injection Inject 10-30 Units into the skin 3 (three) times daily before meals. Per sliding scale 02/05/15  Yes Tiffany Carlota Raspberry, PA-C  insulin glargine (LANTUS) 100 UNIT/ML injection Inject 0.4 mLs (40 Units total) into the skin 2 (two) times daily. Patient taking differently: Inject 35-47 Units into the skin 2 (two) times daily. Take 47 units in the morning, and 35 units at bedtime 02/05/15  Yes  Delos Haring, PA-C  nicotine (NICODERM CQ - DOSED IN MG/24 HOURS) 21 mg/24hr patch Place 1 patch (21 mg total) onto the skin daily. 01/22/15  Yes Debbe Odea, MD  omega-3 acid ethyl esters (LOVAZA) 1 G capsule Take 2 g by mouth 2 (two) times daily.    Yes Historical Provider, MD  Polyvinyl Alcohol-Povidone  (REFRESH OP) Place 2 drops into both eyes 3 (three) times daily as needed (dry eyes).   Yes Historical Provider, MD  potassium chloride 20 MEQ TBCR take 2 tabs (51mEq) for 6 days, then 1 tab (81mEq) daily 05/18/15  Yes Patrecia Pour, MD  verapamil (CALAN-SR) 120 MG CR tablet Take 1 tablet (120 mg total) by mouth at bedtime. 02/05/15  Yes Delos Haring, PA-C  glucose blood test strip Use as instructed 02/05/15   Delos Haring, PA-C  nicotine (NICODERM CQ) 14 mg/24hr patch Place 1 patch (14 mg total) onto the skin daily. 02/04/15   Debbe Odea, MD   No Known Allergies  FAMILY HISTORY:  indicated that his mother is alive. He indicated that his father is alive.  SOCIAL HISTORY:  reports that he has been smoking Cigarettes.  He has a 12 pack-year smoking history. He has never used smokeless tobacco. He reports that he drinks about 3.6 oz of alcohol per week. He reports that he uses illicit drugs (Marijuana).  REVIEW OF SYSTEMS:  Unable.   SUBJECTIVE:   VITAL SIGNS: Temp:  [98 F (36.7 C)] 98 F (36.7 C) (10/06 1308) Pulse Rate:  [77-108] 77 (10/06 1606) Resp:  [20-26] 20 (10/06 1606) BP: (174-244)/(95-152) 182/102 mmHg (10/06 1606) SpO2:  [23 %-97 %] 97 % (10/06 1606) FiO2 (%):  [50 %-100 %] 100 % (10/06 1606) Weight:  [300 lb 0.7 oz (136.1 kg)] 300 lb 0.7 oz (136.1 kg) (10/06 1430) HEMODYNAMICS:   VENTILATOR SETTINGS: Vent Mode:  [-] PRVC FiO2 (%):  [50 %-100 %] 100 % Set Rate:  [20 bmp] 20 bmp Vt Set:  [600 mL] 600 mL PEEP:  [8 cmH20] 8 cmH20 Plateau Pressure:  [18.4 cmH20] 18.4 cmH20 INTAKE / OUTPUT: No intake or output data in the 24 hours ending 06/06/15 1621  PHYSICAL EXAMINATION: General:  Obese male, NAD on vent, appears older than stated age  Neuro:  Sedated on vent, pupils 75mm, sluggish  HEENT:  Mm  Moist, no JVD, ETT  Cardiovascular:  s1s2 tachy Lungs:  resps even, mildly labored on vent, diffuse wheeze, bibasilar crackles  Abdomen:  Obese, round, soft, +bs   Musculoskeletal:  Warm and dry, 1+ BLE edema  LABS:  CBC  Recent Labs Lab 06/06/15 1318 06/06/15 1324  WBC  --  8.6  HGB 19.4* 16.7  HCT 57.0* 52.5*  PLT  --  167   Coag's No results for input(s): APTT, INR in the last 168 hours. BMET  Recent Labs Lab 06/06/15 1318 06/06/15 1413  NA 135 139  K 6.7* 4.3  CL 98* 101  CO2  --  27  BUN 43* 25*  CREATININE 1.90* 2.12*  GLUCOSE 303* 286*   Electrolytes  Recent Labs Lab 06/06/15 1413  CALCIUM 8.7*   Sepsis Markers  Recent Labs Lab 06/06/15 1501  LATICACIDVEN 0.99   ABG  Recent Labs Lab 06/06/15 1501  PHART 7.271*  PCO2ART 70.8*  PO2ART 60.0*   Liver Enzymes  Recent Labs Lab 06/06/15 1413  AST 16  ALT 12*  ALKPHOS 84  BILITOT 0.2*  ALBUMIN 2.3*   Cardiac Enzymes No results for  input(s): TROPONINI, PROBNP in the last 168 hours. Glucose No results for input(s): GLUCAP in the last 168 hours.  Imaging Dg Chest Port 1 View  06/06/2015   CLINICAL DATA:  Shortness of Breath for 2 days. History of congestive heart failure  EXAM: PORTABLE CHEST 1 VIEW  COMPARISON:  Chest radiograph and chest CT May 15, 2015  FINDINGS: There is interstitial edema with cardiomegaly and pulmonary venous hypertension. There is consolidation in the right base with right effusion. There is elevation of the right hemidiaphragm as well. No adenopathy.  IMPRESSION: Evidence of congestive heart failure. Suspect superimposed pneumonia right base. Stable elevation right hemidiaphragm.   Electronically Signed   By: Lowella Grip III M.D.   On: 06/06/2015 13:57   Dg Chest Port 1v Same Day  06/06/2015   CLINICAL DATA:  Encounter for intubation.  EXAM: PORTABLE CHEST 1 VIEW  COMPARISON:  Same day.  FINDINGS: Stable cardiomegaly. No pneumothorax is noted. Endotracheal tube is in grossly good position with distal tip 5 cm above the carina. Orogastric tube is seen entering stomach. Mild central pulmonary vascular congestion is  noted. Stable right basilar opacity is noted concerning for pneumonia or atelectasis with possible associated pleural effusion.  IMPRESSION: Endotracheal and orogastric tubes in grossly good position. Stable cardiomegaly and central pulmonary vascular congestion. Stable right basilar opacity as described above.   Electronically Signed   By: Marijo Conception, M.D.   On: 06/06/2015 15:57     ASSESSMENT / PLAN:  PULMONARY OETT 10/6>>> Acute respiratory failure - r/t acute on chronic dCHF with underling OSA  Tobacco abuse  P:   Vent support - 8cc/kg  F/u CXR  F/u ABG now post intubation  Diuresis as below  SBT in am  Smoking cessation   CARDIOVASCULAR Acute on chronic dCHF - likely exacerbated by cocaine abuse  Cocaine abuse  HTN urgency  Respiratory acidosis  P:  Change nitro gtt to cardene gtt  Avoid B blocker  Cont propofol  Lasix TID x 3 doses  ASA  Resume home PO hydralazine UDS Hold home verapamil, coreg, clonidine   RENAL AKI  P:   F/u chem  Monitor with diuresis   GASTROINTESTINAL NO active issue  P:   PPI  NPO for now  Consider TF in am if no plans for extubation   HEMATOLOGIC No active issue  P:  SQ heparin if head CT ok   INFECTIOUS No obvious source infection  P:   Monitor wbc, fever curve off abx   ENDOCRINE DM   P:   Lantus 20units q12 SSI   NEUROLOGIC AMS - likely r/t respiratory failure/ hypercarbia but also with SIGNIFICANT HTN P:   RASS goal: -1 Stat head CT  Propofol  PRN fentanyl    FAMILY  - Updates:  No family available 10/6    Nickolas Madrid, NP 06/06/2015  4:21 PM Pager: [phone] or [phone]

## 2015-06-06 NOTE — ED Provider Notes (Signed)
INTUBATION Performed by: Ivin Booty  Required items: required blood products, implants, devices, and special equipment available Patient identity confirmed: provided demographic data and hospital-assigned identification number Time out: Immediately prior to procedure a "time out" was called to verify the correct patient, procedure, equipment, support staff and site/side marked as required.  Indications: acute hypoxic hypercarbic respiratory failure  Intubation method: Glidescope Laryngoscopy   Preoxygenation: CPAP, BVM, [place]  Sedatives: Etomidate Paralytic: Rocuronium  Tube Size: 8 cuffed  Post-procedure assessment: chest rise and ETCO2 monitor Breath sounds: equal and absent over the epigastrium Tube secured with: ETT holder Chest x-ray interpreted by radiologist and me.  Chest x-ray findings: endotracheal tube in appropriate position  Patient tolerated the procedure well with no immediate complications.     Ivin Booty, MD 06/06/15 [place], MD 06/06/15 [phone]

## 2015-06-06 NOTE — Progress Notes (Signed)
Pt back from CT uneventful, pt was transported on 100%. Pt is stable at this time and is back in 2M3.

## 2015-06-06 NOTE — ED Notes (Addendum)
Pt successfully intubated by resident and Dr Rogene Houston.

## 2015-06-06 NOTE — Progress Notes (Signed)
[facility] Progress Note Patient Name: Jerry Cantrell DOB: 04-09-1966 MRN: [id number]   Date of Service  06/06/2015  HPI/Events of Note  Review of KUB for OGT placement. OGT is in the mid stomach.  eICU Interventions  OK to use OGT for medications and nutrition.      Intervention Category Intermediate Interventions: Diagnostic test evaluation  [name] 06/06/2015, 8:02 PM

## 2015-06-06 NOTE — Progress Notes (Addendum)
ANTIBIOTIC CONSULT NOTE - INITIAL  Pharmacy Consult for vancomycin Indication: rule out pneumonia  No Known Allergies  Patient Measurements: Height: 6' 0.83" (185 cm) (05/22/2015) Weight: (!) 300 lb 0.7 oz (136.1 kg) (05/22/2015) IBW/kg (Calculated) : 79.52  Vital Signs: Temp: 98 F (36.7 C) (10/06 1308) Temp Source: Axillary (10/06 1308) BP: 182/96 mmHg (10/06 1430) Pulse Rate: 80 (10/06 1430) Intake/Output from previous day:   Intake/Output from this shift:    Labs:  Recent Labs  06/06/15 1318 06/06/15 1324  WBC  --  8.6  HGB 19.4* 16.7  PLT  --  167  CREATININE 1.90*  --    Estimated Creatinine Clearance: 67.9 mL/min (by C-G formula based on Cr of 1.9). No results for input(s): VANCOTROUGH, VANCOPEAK, VANCORANDOM, GENTTROUGH, GENTPEAK, GENTRANDOM, TOBRATROUGH, TOBRAPEAK, TOBRARND, AMIKACINPEAK, AMIKACINTROU, AMIKACIN in the last 72 hours.   Microbiology: Recent Results (from the past 720 hour(s))  MRSA PCR Screening     Status: None   Collection Time: 05/13/15  6:16 PM  Result Value Ref Range Status   MRSA by PCR NEGATIVE NEGATIVE Final    Comment:        The GeneXpert MRSA Assay (FDA approved for NASAL specimens only), is one component of a comprehensive MRSA colonization surveillance program. It is not intended to diagnose MRSA infection nor to guide or monitor treatment for MRSA infections.     Medical History: Past Medical History  Diagnosis Date  . Hypertension   . Hyperlipidemia   . Meralgia paraesthetica 05/03/2012  . Lumbar spondylosis 05/03/2012  . Lumbar spinal stenosis 05/03/2012    Mild with only right L4 nerve root encroachment, no neurogenic claudication   . Urinary incontinence 05/12/2012    Since the start of Sept. 2013   . Hemorrhoids 05/12/2012  . UTI (lower urinary tract infection)   . Meralgia paraesthetica 05/03/2012    On Lyrica which does improve pain.   Marland Kitchen Neuropathy in diabetes ([place]) 05/12/2012  . Substance abuse   .  Hyperlipidemia   . Pneumonia   . Diabetes mellitus   . Diabetes mellitus with nephropathy ([place]) 05/12/2012  . Acute diastolic heart failure ([place])   . Retinopathy   . CHF (congestive heart failure) ([place])   . Obesity     Medications:   (Not in a hospital admission)   Assessment: 80 yoM w/ hx diastolic CHF, HTN, OSA. Presenting w/ SOB (81% sat), recently admitted to hospital for CHF exacerbation (9/12-9/17). Will begin empiric vancomycin and ceftazidime for r/o pneumonia.  BP 195/105, WBC 8.6, afebrile  Goal of Therapy:  Vancomycin trough level 15-20 mcg/ml  Plan:  Vancomycin 2500 mg IV x 1 Vancomycin 1000 mg IV q12h Expected duration 7 days with resolution of temperature and/or normalization of WBC Measure antibiotic drug levels at steady state Follow up culture results, LOT, clinical improvement  Governor Specking, PharmD Clinical Pharmacy Resident Pager: [phone] 06/06/2015,2:59 PM

## 2015-06-06 NOTE — Procedures (Signed)
Intubation Procedure Note Jerry Cantrell [id number]  1966/08/04  Procedure: Intubation Indications: Respiratory insufficiency  Procedure Details Consent: Risks of procedure as well as the alternatives and risks of each were explained to the (patient/caregiver).  Consent for procedure obtained. Time Out: Verified patient identification, verified procedure, site/side was marked, verified correct patient position, special equipment/implants available, medications/allergies/relevent history reviewed, required imaging and test results available.  Performed  Maximum sterile technique was used including gloves and hand hygiene.  4 Glidescope     Evaluation Hemodynamic Status: BP stable throughout; O2 sats: transiently fell during during procedure and currently acceptable Patient's Current Condition: stable Complications: No apparent complications Patient did tolerate procedure well. Chest X-ray ordered to verify placement.  CXR: pending.   Jerry Cantrell 06/06/2015

## 2015-06-06 NOTE — Progress Notes (Signed)
[facility] Progress Note Patient Name: Jerry Cantrell DOB: [birth year] MRN: [id number]   Date of Service  06/06/2015  HPI/Events of Note  Nurse request wrist restraints.   eICU Interventions  Will order bilateral wrist restraints.      Intervention Category Minor Interventions: Routine modifications to care plan (e.g. PRN medications for pain, fever)  Sommer,Steven Eugene 06/06/2015, 8:28 PM

## 2015-06-06 NOTE — ED Notes (Signed)
Pt to ED via [facility] with complaint of SOB - pt recently released from hospital due to CHF exacerbation. For the last 24 hours pt has been growing more short of breath, called EMS, upon EMS arrival pt O2 sats @ 81%, pt leaned in tripod position working to breath, labored, accessory muscle use noted by EMS, pt appeared purple. Pt initally placed on NRB without any improvement, proceeded to place pt on CPAP. Throughout entire event, pt has been mentating well, is a/o x4. VS on arrival -  195/105, HR 80, 93% on BiPap.

## 2015-06-06 NOTE — Consult Note (Signed)
Family Medicine Teaching Service Consult note Service Pager: [phone]  Patient name: Jerry Cantrell Medical record number: [id number] Date of birth: [birth year] Age: 49 y.o. Gender: male  Primary Care Provider: Helane Rima, MD Consultants: CCM Code Status: Full  Chief Complaint: Shortness of breath  Assessment and Plan: Jerry Cantrell is a 49 y.o. male presenting with shortness of breath . PMH is significant for diastolic HFpEF, HTN, HLD, T2DM, CKD Stage III, OSA, and tobacco use.  Family practice was called to admit Jerry Cantrell for concern of CHF exacerbation versus pneumonia. However, as family medicine arrived to the ED, we were informed that he would be intubated for worsening respiratory failure and CCM had already been called    History obtained from chart review: EMS was called and on arrival he was noted to be in the tripod position with home O2 sats to 81% on room air. He was initially started on nasal cannula but without improvement so was advanced to CPAP. On arrival to the ED he was transitioned to BiPap.    However, a short time after calling Family practice the patient was noted to be increasingly sedated with increasing PCO2 and acidosis.   On brief interview of the patient he was in respiratory distress with BiPap in place. He reported worsening shortness of breath for the last day despite compliance with the medication regimen prescribed on his last hospitalization on 9/12. He denies fever, chest pain  Additionally in the ED he was noted to have hyperkalemia to 6.7 on i-stat chem 8 but there was concern for potential hemolyzation. On CMP his K was 4.3  Given acute respiratory distress, worsening respiratory acidosis and increasing sedation likely secondary to hypercarbia he was intubated and CCM consulted by the ED providers to continue care.  Appreciate excellent ICU care, the family medicine service will gladly accept him to our service when medically stable to be  discharged from the ICU   Patient Active Problem List   Diagnosis Date Noted  . Hypertensive emergency 06/06/2015  . Acute respiratory failure with hypercapnia ([place])   . Altered mental status   . Hyperkalemia   . Respiratory failure ([place])   . Acute diastolic CHF (congestive heart failure) ([place])   . Pulmonary nodule 05/22/2015  . Pleural effusion   . Acute on chronic diastolic CHF (congestive heart failure), NYHA class 3 ([place])   . Elevated troponin   . Chest pain at rest 05/13/2015  . Respiratory distress 05/13/2015  . CHF exacerbation ([place]) 05/13/2015  . CAP (community acquired pneumonia) 01/18/2015  . Acute renal failure superimposed on stage 3 chronic kidney disease ([place]) 01/18/2015  . Respiratory acidosis   . Thrombocytopenia ([place]) 12/04/2013  . Diastolic heart failure secondary to hypertension ([place]) 11/30/2013  . Radiculopathy with lower extremity symptoms 11/28/2013  . Dyspnea 09/16/2013  . Depression 06/16/2013  . Proliferative diabetic retinopathy ([place]) 06/08/2013  . Diabetic macular edema of left eye, with cataract, associated with type 2 diabetes mellitus ([place]) 06/08/2013  . Meibomianitis 06/08/2013  . Erectile dysfunction associated with type 2 diabetes mellitus ([place]) 04/22/2013  . Decreased peripheral vision of left eye 04/12/2013  . Plantar fasciitis of right foot 04/12/2013  . Cough 02/09/2013  . Obstructive sleep apnea 02/02/2013  . Diabetic peripheral neuropathy associated with type 2 diabetes mellitus ([place]) 12/27/2012  . Abnormality of gait 12/06/2012  . Family history of malignant neoplasm of gastrointestinal tract 10/10/2012  . Personal history of colonic polyps 10/10/2012  . Smoker 08/18/2012  . Skin tag  08/18/2012  . Chronic low back pain 07/15/2012  . Preventative health care 06/02/2012  . Hyperlipemia 05/12/2012  . Hypertension 05/12/2012  . Urinary incontinence 05/12/2012  . Hemorrhoids 05/12/2012  . Blood per rectum 05/12/2012  . Meralgia  paraesthetica 05/03/2012  . Lumbar spondylosis 05/03/2012  . Lumbar spinal stenosis 05/03/2012  . Insulin dependent type 2 diabetes mellitus, uncontrolled ([place]) 05/13/1999   Past Medical History: Past Medical History  Diagnosis Date  . Hypertension   . Hyperlipidemia   . Meralgia paraesthetica 05/03/2012  . Lumbar spondylosis 05/03/2012  . Lumbar spinal stenosis 05/03/2012    Mild with only right L4 nerve root encroachment, no neurogenic claudication   . Urinary incontinence 05/12/2012    Since the start of Sept. 2013   . Hemorrhoids 05/12/2012  . UTI (lower urinary tract infection)   . Meralgia paraesthetica 05/03/2012    On Lyrica which does improve pain.   Marland Kitchen Neuropathy in diabetes ([place]) 05/12/2012  . Substance abuse   . Hyperlipidemia   . Pneumonia   . Diabetes mellitus   . Diabetes mellitus with nephropathy ([place]) 05/12/2012  . Acute diastolic heart failure ([place])   . Retinopathy   . CHF (congestive heart failure) ([place])   . Obesity    Past Surgical History: Past Surgical History  Procedure Laterality Date  . Tonsillectomy    . Abdominal surgery      Abscess I&D 2/2 infected hair  . Colonoscopy w/ polypectomy      pt to bring records   Social History: Social History  Substance Use Topics  . Smoking status: Current Every Day Smoker -- 0.40 packs/day for 30 years    Types: Cigarettes  . Smokeless tobacco: Never Used     Comment: "Trying - hard to stop"  . Alcohol Use: 3.6 oz/week    6 Cans of beer per week     Comment: beer   Family History: Family History  Problem Relation Age of Onset  . Prostate cancer Father   . Alcohol abuse Father   . Emphysema Father     smoked   Allergies and Medications: No Known Allergies No current facility-administered medications on file prior to encounter.   Current Outpatient Prescriptions on File Prior to Encounter  Medication Sig Dispense Refill  . albuterol (PROVENTIL HFA;VENTOLIN HFA) 108 (90 BASE) MCG/ACT inhaler Inhale 2 puffs  into the lungs every 4 (four) hours as needed for wheezing or shortness of breath. 1 Inhaler 6  . amitriptyline (ELAVIL) 50 MG tablet Take 1 tablet (50 mg total) by mouth at bedtime. 30 tablet 1  . amitriptyline (ELAVIL) 50 MG tablet Take 50 mg by mouth.    Marland Kitchen aspirin 325 MG tablet Take 1 tablet (325 mg total) by mouth daily. 30 tablet 0  . atorvastatin (LIPITOR) 80 MG tablet Take 1 tablet (80 mg total) by mouth daily. 30 tablet 0  . carvedilol (COREG) 25 MG tablet Take 1 tablet (25 mg total) by mouth 2 (two) times daily with a meal. 28 tablet 0  . cloNIDine (CATAPRES) 0.2 MG tablet Take 1 tablet (0.2 mg total) by mouth 3 (three) times daily. 90 tablet 0  . fenofibrate 160 MG tablet Take 1 tablet (160 mg total) by mouth daily. 30 tablet 0  . fluticasone (FLONASE) 50 MCG/ACT nasal spray Place 1-2 sprays into both nostrils 2 (two) times daily. 9.9 g 0  . furosemide (LASIX) 80 MG tablet Take 1 tablet (80 mg total) by mouth daily as needed for edema.  10 tablet 0  . gabapentin (NEURONTIN) 300 MG capsule Take 1 capsule (300 mg total) by mouth 3 (three) times daily. 200 mg in the morning and 100 mg in the evening. 90 capsule 0  . hydrALAZINE (APRESOLINE) 100 MG tablet Take 1 tablet (100 mg total) by mouth 3 (three) times daily. 90 tablet 3  . insulin aspart (NOVOLOG) 100 UNIT/ML injection Inject 10-30 Units into the skin 3 (three) times daily before meals. Per sliding scale 10 mL 0  . insulin glargine (LANTUS) 100 UNIT/ML injection Inject 0.4 mLs (40 Units total) into the skin 2 (two) times daily. (Patient taking differently: Inject 35-47 Units into the skin 2 (two) times daily. Take 47 units in the morning, and 35 units at bedtime) 10 mL 0  . nicotine (NICODERM CQ - DOSED IN MG/24 HOURS) 21 mg/24hr patch Place 1 patch (21 mg total) onto the skin daily. 14 patch 0  . omega-3 acid ethyl esters (LOVAZA) 1 G capsule Take 2 g by mouth 2 (two) times daily.     . Polyvinyl Alcohol-Povidone (REFRESH OP) Place 2  drops into both eyes 3 (three) times daily as needed (dry eyes).    . potassium chloride 20 MEQ TBCR take 2 tabs (26mEq) for 6 days, then 1 tab (37mEq) daily 40 tablet 0  . verapamil (CALAN-SR) 120 MG CR tablet Take 1 tablet (120 mg total) by mouth at bedtime. 8 tablet 0  . glucose blood test strip Use as instructed 100 each 12  . nicotine (NICODERM CQ) 14 mg/24hr patch Place 1 patch (14 mg total) onto the skin daily. 14 patch 0    Objective: BP 137/71 mmHg  Pulse 69  Temp(Src) 99.4 F (37.4 C) (Oral)  Resp 20  Ht 6' 0.83" (1.85 m)  Wt 302 lb 0.5 oz (137 kg)  BMI 40.03 kg/m2  SpO2 97% Exam: General: Sitting up in bed with Bipap in place,  Pulm: Increased work of breathing with difficulty speaking in full sentences Neuro: oriented, with no gross deficits   Labs and Imaging: CBC BMET   Recent Labs Lab 06/06/15 1324  WBC 8.6  HGB 16.7  HCT 52.5*  PLT 167    Recent Labs Lab 06/06/15 1413  NA 139  K 4.3  CL 101  CO2 27  BUN 25*  CREATININE 2.12*  GLUCOSE 286*  CALCIUM 8.7*     CXR: IMPRESSION: Evidence of congestive heart failure. Suspect superimposed pneumonia right base. Stable elevation right hemidiaphragm  Veatrice Bourbon, MD 06/06/2015, 8:53 PM PGY-2, [place] Intern pager: [phone], text pages welcome

## 2015-06-07 ENCOUNTER — Inpatient Hospital Stay (HOSPITAL_COMMUNITY): Payer: Medicaid Other

## 2015-06-07 DIAGNOSIS — J9600 Acute respiratory failure, unspecified whether with hypoxia or hypercapnia: Secondary | ICD-10-CM

## 2015-06-07 DIAGNOSIS — I509 Heart failure, unspecified: Secondary | ICD-10-CM

## 2015-06-07 LAB — GLUCOSE, CAPILLARY
GLUCOSE-CAPILLARY: 239 mg/dL — AB (ref 65–99)
GLUCOSE-CAPILLARY: 288 mg/dL — AB (ref 65–99)
GLUCOSE-CAPILLARY: 338 mg/dL — AB (ref 65–99)

## 2015-06-07 LAB — CBC
HCT: 47.7 % (ref 39.0–52.0)
HEMOGLOBIN: 15.3 g/dL (ref 13.0–17.0)
MCH: 29.5 pg (ref 26.0–34.0)
MCHC: 32.1 g/dL (ref 30.0–36.0)
MCV: 92.1 fL (ref 78.0–100.0)
Platelets: 169 10*3/uL (ref 150–400)
RBC: 5.18 MIL/uL (ref 4.22–5.81)
RDW: 14.1 % (ref 11.5–15.5)
WBC: 8.8 10*3/uL (ref 4.0–10.5)

## 2015-06-07 LAB — RAPID URINE DRUG SCREEN, HOSP PERFORMED
Amphetamines: NOT DETECTED
BARBITURATES: NOT DETECTED
Benzodiazepines: POSITIVE — AB
COCAINE: POSITIVE — AB
OPIATES: NOT DETECTED
Tetrahydrocannabinol: NOT DETECTED

## 2015-06-07 LAB — RENAL FUNCTION PANEL
ANION GAP: 13 (ref 5–15)
Albumin: 2 g/dL — ABNORMAL LOW (ref 3.5–5.0)
BUN: 37 mg/dL — ABNORMAL HIGH (ref 6–20)
CHLORIDE: 96 mmol/L — AB (ref 101–111)
CO2: 28 mmol/L (ref 22–32)
Calcium: 8.6 mg/dL — ABNORMAL LOW (ref 8.9–10.3)
Creatinine, Ser: 2.93 mg/dL — ABNORMAL HIGH (ref 0.61–1.24)
GFR calc non Af Amer: 24 mL/min — ABNORMAL LOW (ref >60)
GFR, EST AFRICAN AMERICAN: 27 mL/min — AB (ref >60)
GLUCOSE: 311 mg/dL — AB (ref 65–99)
Phosphorus: 2.8 mg/dL (ref 2.5–4.6)
Potassium: 4.2 mmol/L (ref 3.5–5.1)
Sodium: 137 mmol/L (ref 135–145)

## 2015-06-07 LAB — TRIGLYCERIDES: Triglycerides: 120 mg/dL (ref <150)

## 2015-06-07 LAB — MAGNESIUM: Magnesium: 1.9 mg/dL (ref 1.7–2.4)

## 2015-06-07 LAB — TROPONIN I
TROPONIN I: 0.03 ng/mL (ref <0.031)
TROPONIN I: 0.03 ng/mL (ref <0.031)
Troponin I: 0.03 ng/mL (ref <0.031)

## 2015-06-07 MED ORDER — HEPARIN SODIUM (PORCINE) 5000 UNIT/ML IJ SOLN
5000.0000 [IU] | Freq: Three times a day (TID) | INTRAMUSCULAR | Status: DC
  Administered 2015-06-07: 5000 [IU] via SUBCUTANEOUS
  Filled 2015-06-07: qty 1

## 2015-06-07 MED ORDER — HEPARIN SODIUM (PORCINE) 5000 UNIT/ML IJ SOLN
5000.0000 [IU] | Freq: Three times a day (TID) | INTRAMUSCULAR | Status: DC
  Administered 2015-06-07 – 2015-06-14 (×20): 5000 [IU] via SUBCUTANEOUS
  Filled 2015-06-07 (×22): qty 1

## 2015-06-07 MED ORDER — PRO-STAT SUGAR FREE PO LIQD
60.0000 mL | Freq: Every day | ORAL | Status: DC
  Administered 2015-06-07 – 2015-06-10 (×14): 60 mL via ORAL
  Filled 2015-06-07 (×19): qty 60

## 2015-06-07 MED ORDER — INSULIN ASPART 100 UNIT/ML ~~LOC~~ SOLN
4.0000 [IU] | Freq: Three times a day (TID) | SUBCUTANEOUS | Status: DC
  Administered 2015-06-08 – 2015-06-14 (×15): 4 [IU] via SUBCUTANEOUS

## 2015-06-07 MED ORDER — VITAL HIGH PROTEIN PO LIQD
1000.0000 mL | ORAL | Status: DC
  Filled 2015-06-07 (×2): qty 1000

## 2015-06-07 MED ORDER — SODIUM CHLORIDE 0.9 % IV BOLUS (SEPSIS)
500.0000 mL | Freq: Once | INTRAVENOUS | Status: AC
  Administered 2015-06-07: 500 mL via INTRAVENOUS

## 2015-06-07 MED ORDER — VITAL HIGH PROTEIN PO LIQD
1000.0000 mL | ORAL | Status: DC
  Administered 2015-06-08 – 2015-06-09 (×2): 1000 mL
  Filled 2015-06-07 (×5): qty 1000

## 2015-06-07 MED ORDER — VITAL HIGH PROTEIN PO LIQD
1000.0000 mL | ORAL | Status: DC
  Filled 2015-06-07: qty 1000

## 2015-06-07 NOTE — Progress Notes (Signed)
eLink Physician-Brief Progress Note Patient Name: Jerry Cantrell DOB: [birth year] MRN: [id number]   Date of Service  06/07/2015  HPI/Events of Note  Oliguria. Clinical situation difficult to sort out. Getting Lasix TID. Creatinine not up to 2.12. ECHO with LVEF = 55% and grade I diastolic dysfunction. Not sure that he needs Lasix. No CVP available.   eICU Interventions  Will bolus with 0.9 NaCl 500 mL IV over 30 minutes now.      Intervention Category Intermediate Interventions: Oliguria - evaluation and management  Sommer,Steven Eugene 06/07/2015, 8:36 PM

## 2015-06-07 NOTE — Progress Notes (Signed)
Initial Nutrition Assessment  DOCUMENTATION CODES:   Morbid obesity  INTERVENTION:    Initiate TF via OGT with Vital High Protein at goal rate of 25 ml/h (600 ml per day) and Prostat 60 ml 5 times per day to provide 1600 kcals, 203 gm protein, 502 ml free water daily.  Total intake with TF + Propofol will be 1930 kcals per day.  NUTRITION DIAGNOSIS:   Inadequate oral intake related to inability to eat as evidenced by NPO status.  GOAL:   Provide needs based on ASPEN/SCCM guidelines  MONITOR:   TF tolerance, Weight trends, Labs, Vent status  REASON FOR ASSESSMENT:   Consult Enteral/tube feeding initiation and management  ASSESSMENT:   49yo male with hx HTN, dCHF, OSA, cocaine abuse recent d/c 9/17 after admission for CHF exacerbation returns 10/6 with increased SOB. Initially tried on bipap in ER but failed requiring intubation.  Discussed patient in ICU rounds and with RN today. Nutrition focused physical exam completed.  No muscle or subcutaneous fat depletion noticed. Received MD Consult for TF initiation and management.  Patient is currently intubated on ventilator support Temp (24hrs), Avg:98.7 F (37.1 C), Min:98 F (36.7 C), Max:99.4 F (37.4 C)  Propofol: 12.5 ml/hr providing 330 kcals per day   Diet Order:  Diet NPO time specified  Skin:  Reviewed, no issues  Last BM:  unknown  Height:   Ht Readings from Last 1 Encounters:  06/06/15 6' 0.83" (1.85 m)    Weight:   Wt Readings from Last 1 Encounters:  06/07/15 305 lb 5.4 oz (138.5 kg)    Ideal Body Weight:  83.6 kg  BMI:  Body mass index is 40.47 kg/(m^2).  Estimated Nutritional Needs:   Kcal:  [id number]  Protein:  >/= 200 gm  Fluid:  2 L  EDUCATION NEEDS:   No education needs identified at this time  Molli Barrows, [place], [place], [place] Pager [phone] After Hours Pager [phone]

## 2015-06-07 NOTE — Progress Notes (Signed)
Patient still intubated. Currently on a nicardipine drip. Will continue to follow. Appreciate the ICU care of patient currently

## 2015-06-07 NOTE — Progress Notes (Signed)
  Echocardiogram 2D Echocardiogram has been performed.  Darlina Sicilian M 06/07/2015, 2:21 PM

## 2015-06-07 NOTE — Progress Notes (Signed)
PULMONARY / CRITICAL CARE MEDICINE   Name: Jerry Cantrell MRN: [id number] DOB: 1966/03/17    ADMISSION DATE:  06/06/2015  REFERRING MD :  EDP  CHIEF COMPLAINT:  Respiratory failure  INITIAL PRESENTATION: 49yo male with hx HTN, dCHF, OSA, cocaine abuse recent d/c 9/17 after admission for CHF exacerbation returns 10/6 with increased SOB. Initially tried on bipap in ER but failed requiring intubation. PCCM consulted for admission.   STUDIES:  2D echo 10/6>>> CT head 10/6 >>> No acute findings CXR 10/6 >>> interstitial edema  SIGNIFICANT EVENTS: 10/7 admit, ett  SUBJECTIVE:  Intubated, opens eyes to sternal rub  VITAL SIGNS: Temp:  [98 F (36.7 C)-99.4 F (37.4 C)] 98.6 F (37 C) (10/07 0740) Pulse Rate:  [65-108] 73 (10/07 0600) Resp:  [17-26] 20 (10/07 0600) BP: (115-244)/(64-152) 148/75 mmHg (10/07 0751) SpO2:  [23 %-100 %] 93 % (10/07 0600) FiO2 (%):  [50 %-100 %] 80 % (10/07 0752) Weight:  [300 lb 0.7 oz (136.1 kg)-305 lb 5.4 oz (138.5 kg)] 305 lb 5.4 oz (138.5 kg) (10/07 0500) HEMODYNAMICS:   VENTILATOR SETTINGS: Vent Mode:  [-] PRVC FiO2 (%):  [50 %-100 %] 80 % Set Rate:  [20 bmp] 20 bmp Vt Set:  [600 mL] 600 mL PEEP:  [8 cmH20] 8 cmH20 Plateau Pressure:  [18.4 [id number] cmH20] 24 cmH20 INTAKE / OUTPUT:  Intake/Output Summary (Last 24 hours) at 06/07/15 0818 Last data filed at 06/07/15 0600  Gross per 24 hour  Intake 1146.74 ml  Output    500 ml  Net 646.74 ml    PHYSICAL EXAMINATION: General: Obese male, NAD on vent, appears older than stated age  Neuro: Sedated on vent, opens eyes to sternal rub HEENT: MMM, ETT  Cardiovascular: RRR Lungs: intubated, diffuse wheeze, bibasilar crackles  Abdomen: Obese, round, soft, +bs  Musculoskeletal: Warm and dry, 1+ BLE edema  LABS:  CBC  Recent Labs Lab 06/06/15 1318 06/06/15 1324 06/07/15 0235  WBC  --  8.6 8.8  HGB 19.4* 16.7 15.3  HCT 57.0* 52.5* 47.7  PLT  --  167 169   Coag's No  results for input(s): APTT, INR in the last 168 hours. BMET  Recent Labs Lab 06/06/15 1318 06/06/15 1413 06/07/15 0235  NA 135 139 137  K 6.7* 4.3 4.2  CL 98* 101 96*  CO2  --  27 28  BUN 43* 25* 37*  CREATININE 1.90* 2.12* 2.93*  GLUCOSE 303* 286* 311*   Electrolytes  Recent Labs Lab 06/06/15 1413 06/07/15 0235  CALCIUM 8.7* 8.6*  MG  --  1.9  PHOS  --  2.8   Sepsis Markers  Recent Labs Lab 06/06/15 1501  LATICACIDVEN 0.99   ABG  Recent Labs Lab 06/06/15 1348 06/06/15 1501 06/06/15 1657  PHART 7.297* 7.271* 7.396  PCO2ART 63.4* 70.8* 50.2*  PO2ART 63.0* 60.0* 74.0*   Liver Enzymes  Recent Labs Lab 06/06/15 1413 06/07/15 0235  AST 16  --   ALT 12*  --   ALKPHOS 84  --   BILITOT 0.2*  --   ALBUMIN 2.3* 2.0*   Cardiac Enzymes No results for input(s): TROPONINI, PROBNP in the last 168 hours. Glucose  Recent Labs Lab 06/06/15 1813 06/06/15 1907 06/07/15 0019 06/07/15 0326 06/07/15 0737  GLUCAP 276* 333* 338* 288* 239*    Imaging Dg Abd 1 View  06/06/2015   CLINICAL DATA:  Orogastric tube placement.  EXAM: ABDOMEN - 1 VIEW  COMPARISON:  None.  FINDINGS: Orogastric tube has been placed,  tip overlying the level of the mid stomach. Bowel gas pattern is nonobstructive. Note is made of dense opacity at the lung bases bilaterally.  IMPRESSION: Interval placement of orogastric tube.   Electronically Signed   By: Nolon Nations M.D.   On: 06/06/2015 16:45   Ct Head Wo Contrast  06/06/2015   CLINICAL DATA:  Altered mental status.  EXAM: CT HEAD WITHOUT CONTRAST  TECHNIQUE: Contiguous axial images were obtained from the base of the skull through the vertex without intravenous contrast.  COMPARISON:  01/18/15.  FINDINGS: There is no intracranial hemorrhage, mass or extra-axial fluid collection. No evidence of acute infarction. Mild generalized atrophy is present. Gray matter and white matter are unremarkable, with normal differentiation. No acute findings  are evident. Calvarium and skullbase are intact. There are air-fluid levels in the maxillary sinuses bilaterally and also in the left sphenoid sinus. Moderate membrane thickening in the ethmoid air cells and in the right sphenoid sinus. There is fluid in the mastoid air cells bilaterally.  IMPRESSION: No acute intracranial findings.  Mild generalized atrophy.  Paranasal sinus disease, with air-fluid levels in the maxillary sinuses and left sphenoid. There also are mastoid effusions bilaterally.   Electronically Signed   By: Andreas Newport M.D.   On: 06/06/2015 23:22   Dg Chest Port 1 View  06/06/2015   CLINICAL DATA:  Shortness of Breath for 2 days. History of congestive heart failure  EXAM: PORTABLE CHEST 1 VIEW  COMPARISON:  Chest radiograph and chest CT May 15, 2015  FINDINGS: There is interstitial edema with cardiomegaly and pulmonary venous hypertension. There is consolidation in the right base with right effusion. There is elevation of the right hemidiaphragm as well. No adenopathy.  IMPRESSION: Evidence of congestive heart failure. Suspect superimposed pneumonia right base. Stable elevation right hemidiaphragm.   Electronically Signed   By: Lowella Grip III M.D.   On: 06/06/2015 13:57   Dg Chest Port 1v Same Day  06/06/2015   CLINICAL DATA:  Encounter for intubation.  EXAM: PORTABLE CHEST 1 VIEW  COMPARISON:  Same day.  FINDINGS: Stable cardiomegaly. No pneumothorax is noted. Endotracheal tube is in grossly good position with distal tip 5 cm above the carina. Orogastric tube is seen entering stomach. Mild central pulmonary vascular congestion is noted. Stable right basilar opacity is noted concerning for pneumonia or atelectasis with possible associated pleural effusion.  IMPRESSION: Endotracheal and orogastric tubes in grossly good position. Stable cardiomegaly and central pulmonary vascular congestion. Stable right basilar opacity as described above.   Electronically Signed   By: Marijo Conception, M.D.   On: 06/06/2015 15:57   Dg Abd Portable 1v  06/06/2015   CLINICAL DATA:  Status post OG tube placement  EXAM: PORTABLE ABDOMEN - 1 VIEW  COMPARISON:  06/06/2015  FINDINGS: Orogastric catheter is again identified within the stomach. The overall position is unchanged. No acute obstructive changes are noted.  IMPRESSION: Orogastric catheter within the stomach.   Electronically Signed   By: Inez Catalina M.D.   On: 06/06/2015 19:57     ASSESSMENT / PLAN:  PULMONARY OETT 10/6>>> Acute respiratory failure - r/t acute on chronic dCHF with underling OSA  Tobacco abuse  P:  Vent support - 8cc/kg  F/u CXR in AM F/u ABG  Diuresis needed SBT unable given peep fio2 Escalate peep Smoking cessation  Duoneb q6hrs  CARDIOVASCULAR Acute on chronic dCHF - likely exacerbated by cocaine abuse  Cocaine abuse  HTN urgency , highest BP  244/152 > MAP 180 Respiratory acidosis  P:  cardene gtt tp goals 25% reduction Avoid B blocker for unopposed  Lasix to neg balance, some caution with crt noted ASA  Resume home PO hydralazine UDS, repeat awaited Hold home verapamil, coreg, clonidine  Cont home fenofibtrate, lipitor Echo pending  RENAL AKI  P:  F/u BMP Monitor with diuresis crt in am  Lasix increase  GASTROINTESTINAL NO active issue  P:  PPI  NPO for now  Consider TF start with protocol  HEMATOLOGIC Secondary polycythemia P:  SQ heparin for VTE prophylaxis   INFECTIOUS No obvious source infection  P:  Monitor wbc, fever curve off abx   Bcx 10/7 >>> Respiratory aspirate 10/7 >>>  ENDOCRINE DM  P:  Lantus 20units q12, may need increase SSI  T F to start  No insulin drip yet as glu trending down Add Tf coverage  NEUROLOGIC AMS - likely r/t respiratory failure/ hypercarbia but also with SIGNIFICANT HTN H/o meralgia paresthetica/neuropathy P:  RASS goal: -1 Propofol  PRN fentanyl  Cont home gabapentin,  elavil WUA  FAMILY  - Updates:   Caleb M. Jerline Pain, [place] Resident PGY-2 06/07/2015 8:18 AM   STAFF NOTE: Linwood Dibbles, MD FACP have personally reviewed patient's available data, including medical history, events of note, physical examination and test results as part of my evaluation. I have discussed with resident/NP and other care providers such as pharmacist, RN and RRT. In addition, I personally evaluated patient and elicited key findings of: remains with crackles, sedated, HTN on Cardene, avoiding BB, lasix increase follow crt closely, peep to 12 to recruit, then attempt to reduce fio2, get tox screen, may need cvp / line, trop ensure done, echo pending, statr TF The patient is critically ill with multiple organ systems failure and requires high complexity decision making for assessment and support, frequent evaluation and titration of therapies, application of advanced monitoring technologies and extensive interpretation of multiple databases.   Critical Care Time devoted to patient care services described in this note is30 Minutes. This time reflects time of care of this signee: Merrie Roof, MD FACP. This critical care time does not reflect procedure time, or teaching time or supervisory time of PA/NP/Med student/Med Resident etc but could involve care discussion time. Rest per NP/medical resident whose note is outlined above and that I agree with   Lavon Paganini. Titus Mould, MD, [place] Pgr: [facility] 06/07/2015 10:52 AM

## 2015-06-08 ENCOUNTER — Inpatient Hospital Stay (HOSPITAL_COMMUNITY): Payer: Medicaid Other

## 2015-06-08 DIAGNOSIS — J811 Chronic pulmonary edema: Secondary | ICD-10-CM | POA: Insufficient documentation

## 2015-06-08 DIAGNOSIS — I5021 Acute systolic (congestive) heart failure: Secondary | ICD-10-CM

## 2015-06-08 DIAGNOSIS — J810 Acute pulmonary edema: Secondary | ICD-10-CM

## 2015-06-08 LAB — BLOOD GAS, ARTERIAL
Acid-Base Excess: 3.6 mmol/L — ABNORMAL HIGH (ref 0.0–2.0)
Bicarbonate: 27.2 mEq/L — ABNORMAL HIGH (ref 20.0–24.0)
DRAWN BY: 10006
FIO2: 0.7
MECHVT: 600 mL
O2 Saturation: 95.1 %
PEEP: 12 cmH2O
PO2 ART: 74.9 mmHg — AB (ref 80.0–100.0)
Patient temperature: 98.6
RATE: 20 resp/min
TCO2: 28.4 mmol/L (ref 0–100)
pCO2 arterial: 38.2 mmHg (ref 35.0–45.0)
pH, Arterial: 7.466 — ABNORMAL HIGH (ref 7.350–7.450)

## 2015-06-08 LAB — CBC
HCT: 45.9 % (ref 39.0–52.0)
Hemoglobin: 14.5 g/dL (ref 13.0–17.0)
MCH: 28.9 pg (ref 26.0–34.0)
MCHC: 31.6 g/dL (ref 30.0–36.0)
MCV: 91.6 fL (ref 78.0–100.0)
PLATELETS: 157 10*3/uL (ref 150–400)
RBC: 5.01 MIL/uL (ref 4.22–5.81)
RDW: 14.4 % (ref 11.5–15.5)
WBC: 7.6 10*3/uL (ref 4.0–10.5)

## 2015-06-08 LAB — BASIC METABOLIC PANEL
Anion gap: 10 (ref 5–15)
BUN: 55 mg/dL — AB (ref 6–20)
CHLORIDE: 102 mmol/L (ref 101–111)
CO2: 29 mmol/L (ref 22–32)
CREATININE: 3.62 mg/dL — AB (ref 0.61–1.24)
Calcium: 8.5 mg/dL — ABNORMAL LOW (ref 8.9–10.3)
GFR calc Af Amer: 21 mL/min — ABNORMAL LOW (ref >60)
GFR calc non Af Amer: 18 mL/min — ABNORMAL LOW (ref >60)
Glucose, Bld: 155 mg/dL — ABNORMAL HIGH (ref 65–99)
Potassium: 3.6 mmol/L (ref 3.5–5.1)
SODIUM: 141 mmol/L (ref 135–145)

## 2015-06-08 LAB — GLUCOSE, CAPILLARY
GLUCOSE-CAPILLARY: 122 mg/dL — AB (ref 65–99)
GLUCOSE-CAPILLARY: 150 mg/dL — AB (ref 65–99)
GLUCOSE-CAPILLARY: 180 mg/dL — AB (ref 65–99)
Glucose-Capillary: 142 mg/dL — ABNORMAL HIGH (ref 65–99)
Glucose-Capillary: 140 mg/dL — ABNORMAL HIGH (ref 65–99)

## 2015-06-08 LAB — TRIGLYCERIDES: Triglycerides: 248 mg/dL — ABNORMAL HIGH (ref <150)

## 2015-06-08 MED ORDER — SODIUM CHLORIDE 0.9 % IV SOLN
INTRAVENOUS | Status: DC
  Administered 2015-06-10: 75 mL/h via INTRAVENOUS

## 2015-06-08 MED ORDER — HYDRALAZINE HCL 50 MG PO TABS
50.0000 mg | ORAL_TABLET | Freq: Three times a day (TID) | ORAL | Status: DC
  Administered 2015-06-08 (×3): 50 mg
  Filled 2015-06-08 (×6): qty 1

## 2015-06-08 NOTE — Progress Notes (Signed)
Decreased PEEP from 12 to 10 per MD to start weaning PEEP once patient was at 40% FIO2.

## 2015-06-08 NOTE — Progress Notes (Signed)
We appreciate the care of the ICU team. We are happy to resume care whenever Ms. Dunkelberger is ready to come out of the ICU.   Paula Compton, MD Family Medicine - PGY 2

## 2015-06-08 NOTE — Progress Notes (Signed)
PULMONARY / CRITICAL CARE MEDICINE   Name: Jerry Cantrell MRN: [id number] DOB: 01-21-1966    ADMISSION DATE:  06/06/2015  REFERRING MD :  EDP  CHIEF COMPLAINT:  Respiratory failure  INITIAL PRESENTATION: 49yo male with hx HTN, dCHF, OSA, cocaine abuse recent d/c 9/17 after admission for CHF exacerbation returns 10/6 with increased SOB. Initially tried on bipap in ER but failed requiring intubation. PCCM consulted for admission.   STUDIES:  2D echo 10/6>>> CT head 10/6 >>> No acute findings CXR 10/6 >>> interstitial edema  SIGNIFICANT EVENTS: 10/6- intubated  SUBJECTIVE:  Low output, fluid back , improved  VITAL SIGNS: Temp:  [98.6 F (37 C)-99.6 F (37.6 C)] 99.2 F (37.3 C) (10/08 0750) Pulse Rate:  [60-80] 74 (10/08 0812) Resp:  [16-20] 20 (10/08 0812) BP: (138-173)/(78-130) 164/100 mmHg (10/08 0812) SpO2:  [92 %-98 %] 95 % (10/08 0812) FiO2 (%):  [50 %-70 %] 50 % (10/08 0812) Weight:  [138.2 kg (304 lb 10.8 oz)] 138.2 kg (304 lb 10.8 oz) (10/08 0456) HEMODYNAMICS:   VENTILATOR SETTINGS: Vent Mode:  [-] PRVC FiO2 (%):  [50 %-70 %] 50 % Set Rate:  [20 bmp] 20 bmp Vt Set:  [600 mL] 600 mL PEEP:  [12 cmH20] 12 cmH20 Plateau Pressure:  [25 cmH20-26 cmH20] 26 cmH20 INTAKE / OUTPUT:  Intake/Output Summary (Last 24 hours) at 06/08/15 [id number] Last data filed at 06/08/15 0700  Gross per 24 hour  Intake 2055.53 ml  Output   1080 ml  Net 975.53 ml    PHYSICAL EXAMINATION: General: Obese male, calm on vent Neuro: Sedated on vent, rass -2, fc HEENT: MMM, ETT , thick neck Cardiovascular: RRR s1 s2  Lungs: ronchi  Abdomen: Obese, round, soft, +bs  Musculoskeletal: Warm and dry, 1+ BLE edema  LABS:  CBC  Recent Labs Lab 06/06/15 1324 06/07/15 0235 06/08/15 0702  WBC 8.6 8.8 7.6  HGB 16.7 15.3 14.5  HCT 52.5* 47.7 45.9  PLT 167 169 157   Coag's No results for input(s): APTT, INR in the last 168 hours. BMET  Recent Labs Lab 06/06/15 1413  06/07/15 0235 06/08/15 0702  NA 139 137 141  K 4.3 4.2 3.6  CL 101 96* 102  CO2 27 28 29   BUN 25* 37* 55*  CREATININE 2.12* 2.93* 3.62*  GLUCOSE 286* 311* 155*   Electrolytes  Recent Labs Lab 06/06/15 1413 06/07/15 0235 06/08/15 0702  CALCIUM 8.7* 8.6* 8.5*  MG  --  1.9  --   PHOS  --  2.8  --    Sepsis Markers  Recent Labs Lab 06/06/15 1501  LATICACIDVEN 0.99   ABG  Recent Labs Lab 06/06/15 1501 06/06/15 1657 06/08/15 0555  PHART 7.271* 7.396 7.466*  PCO2ART 70.8* 50.2* 38.2  PO2ART 60.0* 74.0* 74.9*   Liver Enzymes  Recent Labs Lab 06/06/15 1413 06/07/15 0235  AST 16  --   ALT 12*  --   ALKPHOS 84  --   BILITOT 0.2*  --   ALBUMIN 2.3* 2.0*   Cardiac Enzymes  Recent Labs Lab 06/07/15 1246 06/07/15 1857 06/07/15 2300  TROPONINI 0.03 0.03 0.03   Glucose  Recent Labs Lab 06/06/15 1813 06/06/15 1907 06/07/15 0019 06/07/15 0326 06/07/15 0737 06/08/15  GLUCAP 276* 333* 338* 288* 239* 180*    Imaging Dg Abd Portable 1v  06/07/2015   CLINICAL DATA:  Status post OG tube placement.  EXAM: PORTABLE ABDOMEN - 1 VIEW  COMPARISON:  06/07/2015  FINDINGS: The enteric tube has been  advanced and appears to be folded upon itself within the distal aspect of the stomach/proximal duodenum. Nonobstructed bowel gas pattern. Motion artifact limits exam.  IMPRESSION: Interval advancement of the enteric tube which appears to be coiled upon itself predominately located within the stomach and proximal duodenum.   Electronically Signed   By: Lovey Newcomer M.D.   On: 06/07/2015 17:46   Dg Abd Portable 1v  06/07/2015   CLINICAL DATA:  Orogastric tube placement.  EXAM: PORTABLE ABDOMEN - 1 VIEW  COMPARISON:  06/06/2015  FINDINGS: Previously-seen enteric catheter has now folded on itself with tip within the expected location of the distal esophagus and side hole at the expected location of the gastroesophageal junction. The bowel gas pattern is normal. No radio-opaque  calculi or other significant radiographic abnormality are seen.  IMPRESSION: Enteric catheter folded on itself with tip within the expected location of the distal esophagus. Readjustment is suggested.  These results will be called to the ordering clinician or representative by the Radiologist Assistant, and communication documented in the PACS or zVision Dashboard.   Electronically Signed   By: Fidela Salisbury M.D.   On: 06/07/2015 16:33     ASSESSMENT / PLAN:  PULMONARY OETT 10/6>>> Acute respiratory failure - r/t acute on chronic dCHF with underling OSA  Tobacco abuse  P:  Get pcxr now and in am  Duoneb q6hrs ABG reviewed, rate reduction, likely can reduce to 40% then peep down Get pcxr as far as volume management  CARDIOVASCULAR Acute on chronic dCHF - likely exacerbated by cocaine abuse  Cocaine abuse and intox on admission HTN urgency , highest BP 244/152 > MAP 180 Respiratory acidosis  P:  cardene gtt tp goals 25% reduction- currently in this range, goal to reduce with below hydral Avoid B blocker for unopposed  May need cvp ASA  Resume home PO hydralazine, increase UDS is pos cocaine Hold home verapamil, coreg, clonidine  Cont home fenofibtrate, lipitor Echo pending May need IV hydral on top  RENAL AKI  P:  F/u BMP Continue pos balance back Lasix dc  GASTROINTESTINAL NO active issue  P:  PPI  TF  HEMATOLOGIC Secondary polycythemia P:  SQ heparin for VTE prophylaxis   INFECTIOUS No obvious source infection  P:  Monitor fever  Bcx 10/7 >>> Respiratory aspirate 10/7 >>>  ENDOCRINE DM  P:  Lantus 20units q12, may need increase SSI   No insulin drip needed, last glu 180 Keep Tf coverage  NEUROLOGIC AMS - likely r/t respiratory failure/ hypercarbia but also with SIGNIFICANT HTN H/o meralgia paresthetica/neuropathy P:  RASS goal: -2, peep needs etc Propofol  PRN fentanyl  WUA  FAMILY  - Updates: no  fam  Ccm time 30 min   Lavon Paganini. Titus Mould, MD, [place] Pgr: [facility]

## 2015-06-08 NOTE — Progress Notes (Signed)
Wakeup assessment deferred due to patient high levels of PEEP as per Dr. Harland Dingwall.

## 2015-06-09 ENCOUNTER — Inpatient Hospital Stay (HOSPITAL_COMMUNITY): Payer: Medicaid Other

## 2015-06-09 DIAGNOSIS — F1410 Cocaine abuse, uncomplicated: Secondary | ICD-10-CM | POA: Insufficient documentation

## 2015-06-09 LAB — GLUCOSE, CAPILLARY
GLUCOSE-CAPILLARY: 110 mg/dL — AB (ref 65–99)
GLUCOSE-CAPILLARY: 183 mg/dL — AB (ref 65–99)
GLUCOSE-CAPILLARY: 184 mg/dL — AB (ref 65–99)
GLUCOSE-CAPILLARY: 257 mg/dL — AB (ref 65–99)
Glucose-Capillary: 180 mg/dL — ABNORMAL HIGH (ref 65–99)
Glucose-Capillary: 174 mg/dL — ABNORMAL HIGH (ref 65–99)

## 2015-06-09 LAB — CBC WITH DIFFERENTIAL/PLATELET
Basophils Absolute: 0 10*3/uL (ref 0.0–0.1)
Basophils Relative: 0 %
EOS PCT: 1 %
Eosinophils Absolute: 0.1 10*3/uL (ref 0.0–0.7)
HEMATOCRIT: 45.7 % (ref 39.0–52.0)
HEMOGLOBIN: 14.5 g/dL (ref 13.0–17.0)
LYMPHS ABS: 1.6 10*3/uL (ref 0.7–4.0)
LYMPHS PCT: 24 %
MCH: 29.4 pg (ref 26.0–34.0)
MCHC: 31.7 g/dL (ref 30.0–36.0)
MCV: 92.5 fL (ref 78.0–100.0)
Monocytes Absolute: 0.6 10*3/uL (ref 0.1–1.0)
Monocytes Relative: 8 %
NEUTROS ABS: 4.5 10*3/uL (ref 1.7–7.7)
NEUTROS PCT: 67 %
Platelets: 150 10*3/uL (ref 150–400)
RBC: 4.94 MIL/uL (ref 4.22–5.81)
RDW: 14.5 % (ref 11.5–15.5)
WBC: 6.8 10*3/uL (ref 4.0–10.5)

## 2015-06-09 LAB — BASIC METABOLIC PANEL
ANION GAP: 12 (ref 5–15)
BUN: 65 mg/dL — AB (ref 6–20)
CHLORIDE: 101 mmol/L (ref 101–111)
CO2: 30 mmol/L (ref 22–32)
Calcium: 8.3 mg/dL — ABNORMAL LOW (ref 8.9–10.3)
Creatinine, Ser: 3.07 mg/dL — ABNORMAL HIGH (ref 0.61–1.24)
GFR calc Af Amer: 26 mL/min — ABNORMAL LOW (ref >60)
GFR calc non Af Amer: 22 mL/min — ABNORMAL LOW (ref >60)
Glucose, Bld: 214 mg/dL — ABNORMAL HIGH (ref 65–99)
POTASSIUM: 3.5 mmol/L (ref 3.5–5.1)
SODIUM: 143 mmol/L (ref 135–145)

## 2015-06-09 LAB — TRIGLYCERIDES: TRIGLYCERIDES: 393 mg/dL — AB (ref <150)

## 2015-06-09 LAB — MAGNESIUM: Magnesium: 1.9 mg/dL (ref 1.7–2.4)

## 2015-06-09 LAB — PHOSPHORUS: Phosphorus: 4.9 mg/dL — ABNORMAL HIGH (ref 2.5–4.6)

## 2015-06-09 MED ORDER — CLONIDINE HCL 0.2 MG PO TABS
0.2000 mg | ORAL_TABLET | Freq: Three times a day (TID) | ORAL | Status: DC
Start: 2015-06-09 — End: 2015-06-14
  Administered 2015-06-09 – 2015-06-14 (×17): 0.2 mg via ORAL
  Filled 2015-06-09 (×19): qty 1

## 2015-06-09 MED ORDER — HYDRALAZINE HCL 50 MG PO TABS
75.0000 mg | ORAL_TABLET | Freq: Three times a day (TID) | ORAL | Status: DC
  Administered 2015-06-09 – 2015-06-11 (×7): 75 mg
  Filled 2015-06-09 (×9): qty 1

## 2015-06-09 MED ORDER — PANTOPRAZOLE SODIUM 40 MG PO PACK
40.0000 mg | PACK | Freq: Every day | ORAL | Status: DC
  Administered 2015-06-09 – 2015-06-11 (×3): 40 mg
  Filled 2015-06-09 (×3): qty 20

## 2015-06-09 NOTE — Progress Notes (Signed)
PULMONARY / CRITICAL CARE MEDICINE   Name: Elon Eoff MRN: [id number] DOB: Oct 28, 1965    ADMISSION DATE:  06/06/2015  REFERRING MD :  EDP  CHIEF COMPLAINT:  Respiratory failure  INITIAL PRESENTATION: 49yo male with hx HTN, dCHF, OSA, cocaine abuse recent d/c 9/17 after admission for CHF exacerbation returns 10/6 with increased SOB. Initially tried on bipap in ER but failed requiring intubation. PCCM consulted for admission.   STUDIES:  2D echo 10/6>>> CT head 10/6 >>> No acute findings CXR 10/6 >>> interstitial edema  SIGNIFICANT EVENTS: 10/6- intubated 10/7- overdiuresis  SUBJECTIVE:  Peep is better, pos balance  VITAL SIGNS: Temp:  [97.8 F (36.6 C)-99.7 F (37.6 C)] 99.7 F (37.6 C) (10/09 0738) Pulse Rate:  [58-78] 65 (10/09 0600) Resp:  [15-20] 20 (10/09 0600) BP: (142-174)/(79-107) 160/92 mmHg (10/09 0600) SpO2:  [93 %-97 %] 95 % (10/09 0600) FiO2 (%):  [40 %-50 %] 40 % (10/09 0320) Weight:  [135.3 kg (298 lb 4.5 oz)] 135.3 kg (298 lb 4.5 oz) (10/09 0500) HEMODYNAMICS:   VENTILATOR SETTINGS: Vent Mode:  [-] PRVC FiO2 (%):  [40 %-50 %] 40 % Set Rate:  [20 bmp] 20 bmp Vt Set:  [600 mL] 600 mL PEEP:  [10 HQP59-16 cmH20] 10 cmH20 Plateau Pressure:  [18 cmH20-26 cmH20] 25 cmH20 INTAKE / OUTPUT:  Intake/Output Summary (Last 24 hours) at 06/09/15 0749 Last data filed at 06/09/15 0600  Gross per 24 hour  Intake 3913.49 ml  Output   3360 ml  Net 553.49 ml    PHYSICAL EXAMINATION: General: Obese male, calm on vent Neuro: Sedated on vent, rass -3 HEENT:jvd wnl Cardiovascular: RRR s1 s2  Lungs: ronchi resolving Abdomen: Obese, round, soft, +bs  Musculoskeletal: Warm and dry, 1+ BLE edema, nonpitting  LABS:  CBC  Recent Labs Lab 06/07/15 0235 06/08/15 0702 06/09/15 0311  WBC 8.8 7.6 6.8  HGB 15.3 14.5 14.5  HCT 47.7 45.9 45.7  PLT 169 157 150   Coag's No results for input(s): APTT, INR in the last 168 hours. BMET  Recent  Labs Lab 06/07/15 0235 06/08/15 0702 06/09/15 0311  NA 137 141 143  K 4.2 3.6 3.5  CL 96* 102 101  CO2 28 29 30   BUN 37* 55* 65*  CREATININE 2.93* 3.62* 3.07*  GLUCOSE 311* 155* 214*   Electrolytes  Recent Labs Lab 06/07/15 0235 06/08/15 0702 06/09/15 0311  CALCIUM 8.6* 8.5* 8.3*  MG 1.9  --  1.9  PHOS 2.8  --  4.9*   Sepsis Markers  Recent Labs Lab 06/06/15 1501  LATICACIDVEN 0.99   ABG  Recent Labs Lab 06/06/15 1501 06/06/15 1657 06/08/15 0555  PHART 7.271* 7.396 7.466*  PCO2ART 70.8* 50.2* 38.2  PO2ART 60.0* 74.0* 74.9*   Liver Enzymes  Recent Labs Lab 06/06/15 1413 06/07/15 0235  AST 16  --   ALT 12*  --   ALKPHOS 84  --   BILITOT 0.2*  --   ALBUMIN 2.3* 2.0*   Cardiac Enzymes  Recent Labs Lab 06/07/15 1246 06/07/15 1857 06/07/15 2300  TROPONINI 0.03 0.03 0.03   Glucose  Recent Labs Lab 06/08/15 0747 06/08/15 1310 06/08/15 1545 06/08/15 2013 06/09/15 0030 06/09/15 0434  GLUCAP 150* 142* 140* 122* 184* 180*    Imaging Dg Chest Port 1 View  06/08/2015   CLINICAL DATA:  Followup pulmonary edema and ventilator support.  EXAM: PORTABLE CHEST 1 VIEW  COMPARISON:  06/06/2015 and previous  FINDINGS: The tracheal tube has its tip 6  cm above the carina. Nasogastric tube enters the stomach. Effusions persist larger on the right than the left with lower lobe volume loss. There is mild edema, improving over time.  IMPRESSION: Slowly improving pulmonary edema. Effusions and lower lobe volume loss right more than left.   Electronically Signed   By: Nelson Chimes M.D.   On: 06/08/2015 09:20     ASSESSMENT / PLAN:  PULMONARY OETT 10/6>>> Acute respiratory failure - r/t acute on chronic dCHF with underling OSA  Tobacco abuse  P:  Slight slow improving int changes Control afterload Avoid cocaine Some concern aspiration with predominant rt side infiltrate  Likely to peep goal 5 today, if able, then cpap 5-8 ps  8-10  CARDIOVASCULAR Acute on chronic dCHF - likely exacerbated by cocaine abuse  Cocaine abuse and intox on admission HTN urgency , highest BP 244/152 > MAP 180 Respiratory acidosis  P:  25%-35% reduction MAP goals , met Avoid B blocker for unopposed alpha risk (soon can restart coreg) ASA  Resume home PO hydralazine, increase further Hold home verapamil,  Restart clonidine  Cont home fenofibtrate, lipitor Echo LVH, diastolic, no per eff May need IV hydral on top  RENAL AKI  P:  F/u BMP Continue pos balance back Lasix holding  GASTROINTESTINAL NO active issue  P:  PPI  TF, increase rate Monitor for BM  HEMATOLOGIC Secondary polycythemia P:  SQ heparin for VTE prophylaxis   INFECTIOUS No obvious source infection, at risk asp pna P:  Monitor fever, pcxr again in am   Bcx 10/7 >>> Respiratory aspirate 10/7 >>>  ENDOCRINE DM  P:  Lantus 20units q12, may need increase some slight increase SSI   Keep Tf coverage  NEUROLOGIC AMS - likely r/t respiratory failure/ hypercarbia but also with SIGNIFICANT HTN H/o meralgia paresthetica/neuropathy P:  RASS goal: -1 Propofol trig noted, consider dc in 24 hr PRN fentanyl  WUA  FAMILY  - Updates: no fam  Ccm time 30 min   Lavon Paganini. Titus Mould, MD, [place] Pgr: [facility]

## 2015-06-10 ENCOUNTER — Inpatient Hospital Stay (HOSPITAL_COMMUNITY): Payer: Medicaid Other

## 2015-06-10 LAB — BASIC METABOLIC PANEL
Anion gap: 9 (ref 5–15)
BUN: 68 mg/dL — ABNORMAL HIGH (ref 6–20)
CALCIUM: 8 mg/dL — AB (ref 8.9–10.3)
CHLORIDE: 108 mmol/L (ref 101–111)
CO2: 30 mmol/L (ref 22–32)
Creatinine, Ser: 2.67 mg/dL — ABNORMAL HIGH (ref 0.61–1.24)
GFR calc Af Amer: 31 mL/min — ABNORMAL LOW (ref >60)
GFR calc non Af Amer: 26 mL/min — ABNORMAL LOW (ref >60)
GLUCOSE: 169 mg/dL — AB (ref 65–99)
Potassium: 3.8 mmol/L (ref 3.5–5.1)
Sodium: 147 mmol/L — ABNORMAL HIGH (ref 135–145)

## 2015-06-10 LAB — CBC WITH DIFFERENTIAL/PLATELET
BASOS PCT: 0 %
Basophils Absolute: 0 10*3/uL (ref 0.0–0.1)
EOS ABS: 0.1 10*3/uL (ref 0.0–0.7)
Eosinophils Relative: 2 %
HEMATOCRIT: 45.7 % (ref 39.0–52.0)
Hemoglobin: 13.9 g/dL (ref 13.0–17.0)
LYMPHS ABS: 1.4 10*3/uL (ref 0.7–4.0)
Lymphocytes Relative: 22 %
MCH: 29 pg (ref 26.0–34.0)
MCHC: 30.4 g/dL (ref 30.0–36.0)
MCV: 95.2 fL (ref 78.0–100.0)
MONO ABS: 0.6 10*3/uL (ref 0.1–1.0)
MONOS PCT: 9 %
Neutro Abs: 4.4 10*3/uL (ref 1.7–7.7)
Neutrophils Relative %: 67 %
PLATELETS: 148 10*3/uL — AB (ref 150–400)
RBC: 4.8 MIL/uL (ref 4.22–5.81)
RDW: 14.7 % (ref 11.5–15.5)
WBC: 6.5 10*3/uL (ref 4.0–10.5)

## 2015-06-10 LAB — POCT I-STAT, CHEM 8
BUN: 43 mg/dL — ABNORMAL HIGH (ref 6–20)
CHLORIDE: 98 mmol/L — AB (ref 101–111)
CREATININE: 1.9 mg/dL — AB (ref 0.61–1.24)
Calcium, Ion: 1 mmol/L — ABNORMAL LOW (ref 1.12–1.23)
GLUCOSE: 303 mg/dL — AB (ref 65–99)
HCT: 57 % — ABNORMAL HIGH (ref 39.0–52.0)
HEMOGLOBIN: 19.4 g/dL — AB (ref 13.0–17.0)
POTASSIUM: 6.7 mmol/L — AB (ref 3.5–5.1)
Sodium: 135 mmol/L (ref 135–145)
TCO2: 31 mmol/L (ref 0–100)

## 2015-06-10 LAB — GLUCOSE, CAPILLARY
GLUCOSE-CAPILLARY: 165 mg/dL — AB (ref 65–99)
Glucose-Capillary: 197 mg/dL — ABNORMAL HIGH (ref 65–99)
Glucose-Capillary: 187 mg/dL — ABNORMAL HIGH (ref 65–99)
Glucose-Capillary: 68 mg/dL (ref 65–99)

## 2015-06-10 LAB — TRIGLYCERIDES: Triglycerides: 313 mg/dL — ABNORMAL HIGH (ref <150)

## 2015-06-10 MED ORDER — HYDRALAZINE HCL 20 MG/ML IJ SOLN
10.0000 mg | INTRAMUSCULAR | Status: DC | PRN
  Administered 2015-06-10: 20 mg via INTRAVENOUS
  Administered 2015-06-10 (×2): 10 mg via INTRAVENOUS
  Filled 2015-06-10 (×2): qty 1

## 2015-06-10 MED ORDER — DEXTROSE-NACL 5-0.45 % IV SOLN
INTRAVENOUS | Status: DC
  Administered 2015-06-10: 20:00:00 via INTRAVENOUS

## 2015-06-10 MED ORDER — FUROSEMIDE 10 MG/ML IJ SOLN
80.0000 mg | Freq: Two times a day (BID) | INTRAMUSCULAR | Status: DC
  Administered 2015-06-10 – 2015-06-11 (×3): 80 mg via INTRAVENOUS
  Filled 2015-06-10 (×5): qty 8

## 2015-06-10 MED ORDER — FENTANYL CITRATE (PF) 100 MCG/2ML IJ SOLN
25.0000 ug | INTRAMUSCULAR | Status: DC | PRN
  Administered 2015-06-10: 25 ug via INTRAVENOUS
  Filled 2015-06-10: qty 2

## 2015-06-10 MED ORDER — HYDRALAZINE HCL 20 MG/ML IJ SOLN
20.0000 mg | INTRAMUSCULAR | Status: DC | PRN
  Administered 2015-06-10: 40 mg via INTRAVENOUS
  Filled 2015-06-10: qty 2

## 2015-06-10 MED ORDER — INSULIN GLARGINE 100 UNIT/ML ~~LOC~~ SOLN
10.0000 [IU] | Freq: Two times a day (BID) | SUBCUTANEOUS | Status: DC
  Administered 2015-06-10: 10 [IU] via SUBCUTANEOUS
  Filled 2015-06-10 (×2): qty 0.1

## 2015-06-10 MED ORDER — INSULIN GLARGINE 100 UNIT/ML ~~LOC~~ SOLN
10.0000 [IU] | Freq: Every day | SUBCUTANEOUS | Status: DC
  Administered 2015-06-11 – 2015-06-13 (×3): 10 [IU] via SUBCUTANEOUS
  Filled 2015-06-10 (×4): qty 0.1

## 2015-06-10 MED ORDER — VERAPAMIL HCL 80 MG PO TABS
80.0000 mg | ORAL_TABLET | Freq: Once | ORAL | Status: AC
  Administered 2015-06-10: 80 mg via ORAL
  Filled 2015-06-10: qty 1

## 2015-06-10 MED ORDER — DEXTROSE 50 % IV SOLN
INTRAVENOUS | Status: AC
  Administered 2015-06-10: 50 mL
  Filled 2015-06-10: qty 50

## 2015-06-10 NOTE — Progress Notes (Signed)
Inpatient Diabetes Program Recommendations  AACE/ADA: New Consensus Statement on Inpatient Glycemic Control (2015)  Target Ranges:  Prepandial:   less than 140 mg/dL      Peak postprandial:   less than 180 mg/dL (1-2 hours)      Critically ill patients:  140 - 180 mg/dL   Review of Glycemic Control:  Results for LANCE, MONTFORD (MRN [id number]) as of 06/10/2015 12:03  Ref. Range 06/09/2015 16:08 06/09/2015 20:20 06/09/2015 23:38 06/10/2015 07:28 06/10/2015 09:06  Glucose-Capillary Latest Ref Range: 65-99 mg/dL 110 (H) 174 (H) 165 (H) 197 (H) 187 (H)    Consider d/c of Novolog meal coverage 4 units tid with meals since patient is currently NPO.    Thanks, Adah Perl, RN, BC-ADM Inpatient Diabetes Coordinator Pager [phone] (8a-5p)

## 2015-06-10 NOTE — Progress Notes (Signed)
PULMONARY / CRITICAL CARE MEDICINE   Name: Hunter Bachar MRN: [id number] DOB: 05/12/1966    ADMISSION DATE:  06/06/2015  REFERRING MD :  EDP  CHIEF COMPLAINT:  Respiratory failure  INITIAL PRESENTATION: 49yo male with hx HTN, dCHF, OSA, cocaine abuse recent d/c 9/17 after admission for CHF exacerbation returns 10/6 with increased SOB. Initially tried on bipap in ER but failed requiring intubation. PCCM consulted for admission.   STUDIES:  2D echo 10/6>>> CT head 10/6 >>> No acute findings CXR 10/6 >>> interstitial edema  SIGNIFICANT EVENTS: 10/6- intubated 10/7- overdiuresis  SUBJECTIVE:  Intubated, sedated, follows commands  VITAL SIGNS: Temp:  [99 F (37.2 C)-99.8 F (37.7 C)] 99 F (37.2 C) (10/10 0731) Pulse Rate:  [66-76] 66 (10/10 0500) Resp:  [14-20] 15 (10/10 0500) BP: (134-189)/(73-147) 134/73 mmHg (10/10 0500) SpO2:  [92 %-96 %] 95 % (10/10 0500) FiO2 (%):  [40 %] 40 % (10/10 0500) Weight:  [292 lb 1.8 oz (132.5 kg)] 292 lb 1.8 oz (132.5 kg) (10/10 0417) HEMODYNAMICS:   VENTILATOR SETTINGS: Vent Mode:  [-] PRVC FiO2 (%):  [40 %] 40 % Set Rate:  [14 bmp] 14 bmp Vt Set:  [600 mL] 600 mL PEEP:  [5 cmH20-8 cmH20] 5 cmH20 Plateau Pressure:  [18 cmH20-25 cmH20] 21 cmH20 INTAKE / OUTPUT:  Intake/Output Summary (Last 24 hours) at 06/10/15 0732 Last data filed at 06/10/15 0600  Gross per 24 hour  Intake 3818.3 ml  Output   3270 ml  Net  548.3 ml    PHYSICAL EXAMINATION: General: Obese male, calm on vent Neuro: Sedated on vent, follows commands HEENT:jvd wnl Cardiovascular: RRR s1 s2  Lungs: ronchi resolving Abdomen: Obese, round, soft, +bs  Musculoskeletal: Warm and dry, 1+ BLE edema, nonpitting  LABS:  CBC  Recent Labs Lab 06/08/15 0702 06/09/15 0311 06/10/15 0258  WBC 7.6 6.8 6.5  HGB 14.5 14.5 13.9  HCT 45.9 45.7 45.7  PLT 157 150 148*   Coag's No results for input(s): APTT, INR in the last 168 hours. BMET  Recent  Labs Lab 06/08/15 0702 06/09/15 0311 06/10/15 0258  NA 141 143 147*  K 3.6 3.5 3.8  CL 102 101 108  CO2 _0 BUN 55* 65* 68*  CREATININE 3.62* 3.07* 2.67*  GLUCOSE 155* 214* 169*   Electrolytes  Recent Labs Lab 06/07/15 0235 06/08/15 0702 06/09/15 0311 06/10/15 0258  CALCIUM 8.6* 8.5* 8.3* 8.0*  MG 1.9  --  1.9  --   PHOS 2.8  --  4.9*  --    Sepsis Markers  Recent Labs Lab 06/06/15 1501  LATICACIDVEN 0.99   ABG  Recent Labs Lab 06/06/15 1501 06/06/15 1657 06/08/15 0555  PHART 7.271* 7.396 7.466*  PCO2ART 70.8* 50.2* 38.2  PO2ART 60.0* 74.0* 74.9*   Liver Enzymes  Recent Labs Lab 06/06/15 1413 06/07/15 0235  AST 16  --   ALT 12*  --   ALKPHOS 84  --   BILITOT 0.2*  --   ALBUMIN 2.3* 2.0*   Cardiac Enzymes  Recent Labs Lab 06/07/15 1246 06/07/15 1857 06/07/15 2300  TROPONINI 0.03 0.03 0.03   Glucose  Recent Labs Lab 06/09/15 0030 06/09/15 0434 06/09/15 0742 06/09/15 1158 06/09/15 1608 06/09/15 2020  GLUCAP 184* 180* 257* 183* 110* 174*    Imaging Dg Chest Port 1 View  06/10/2015   CLINICAL DATA:  Hypoxia.  Cocaine abuse.  EXAM: PORTABLE CHEST 1 VIEW  COMPARISON:  June 09, 2015  FINDINGS: Endotracheal tube  tip is 3.9 cm above the carina. Nasogastric tube tip and side port are below the diaphragm. No pneumothorax. There is interstitial edema with bilateral effusions, stable. There is airspace consolidation in the left base, stable. No new opacity. Heart is enlarged. Pulmonary vascularity is within normal limits. No adenopathy appreciable.  IMPRESSION: Tube positions as described without pneumothorax. Findings felt to represent a degree of congestive heart failure. Superimposed pneumonia in the left base cannot be excluded. The overall appearance is stable compared to 1 day prior.   Electronically Signed   By: Lowella Grip III M.D.   On: 06/10/2015 07:11     ASSESSMENT / PLAN:  PULMONARY OETT 10/6>>> Acute respiratory  failure - r/t acute on chronic dCHF with underling Severe OSA  Tobacco abuse  P:  Control afterload Some concern aspiration with predominant rt side infiltrate  Doing well on minimal pressure support Extubate to bipap today Will need BiPAP at night for severe OSA  CARDIOVASCULAR Acute on chronic dCHF - likely exacerbated by cocaine abuse  Cocaine abuse and intox on admission HTN urgency , highest BP 244/152 > MAP 180 Respiratory acidosis  P:  25%-35% reduction MAP goals , met Avoid B blocker for unopposed alpha risk ASA  Resume home PO hydralazine, clonidine, verapamil  Will start prn IV hydralazine SBP >180 Cont home fenofibtrate, lipitor Echo: LVH, diastolic, no per eff  RENAL AKI  Continues to be volume overloaded clinically P:  Daily BMP Lasix 45m bid  GASTROINTESTINAL NO active issue  P:  PPI  NPO while on BiPAP Monitor for BM  HEMATOLOGIC Secondary polycythemia P:  SQ heparin for VTE prophylaxis   INFECTIOUS No obvious source infection, at risk asp pna P:  Monitor fever  Bcx 10/7 >>> Respiratory aspirate 10/7 >>>  ENDOCRINE DM  P:  Lantus 20units q12 (half while NPO) SSI    NEUROLOGIC AMS - likely r/t respiratory failure/ hypercarbia but also with SIGNIFICANT HTN, improving H/o meralgia paresthetica/neuropathy P:  Monitor  FAMILY  - Updates: no fam   Mica Ramdass M. PJerline Pain MBarberMedicine Resident PGY-2 06/10/2015 7:32 AM

## 2015-06-10 NOTE — Progress Notes (Signed)
RN paged about CBG 68.  Patient received Lantus 10 units this AM and has not had any PO intake.  - Start D5 1/2NS @ 15mL/hr - Try to limit volume of IVF given volume overload - hold nighttime dose of Lantus - re-evaluate dextrose later this evening  Virginia Crews, MD, MPH PGY-2,  [facility] 06/10/2015 8:10 PM

## 2015-06-10 NOTE — Progress Notes (Signed)
RN called to discuss elevated BP - BP currently in 220s/100s.  - Increase prn Hydralazine to 20-40mg  q4h prn - avoid beta blocker as patient uses cocaine - consider re-dosing verapamil tonight if continues to be elevated  Virginia Crews, MD, MPH PGY-2,  [facility] 06/10/2015 5:51 PM

## 2015-06-10 NOTE — Progress Notes (Signed)
Patient taken off of Bipap and placed on non-rebreather mask.  Patient had copious amount of oral, and nasal, secretions.  Patient stated that he felt more comfortable on the non-rebreather mask.  Will leave off of Bipap until tonight, if tolerated.

## 2015-06-10 NOTE — Procedures (Signed)
Extubation Procedure Note  Patient Details:   Name: Dorman Clinkscales DOB: 26-Apr-1966 MRN: [id number]   Airway Documentation:     Evaluation  O2 sats: transiently fell during during procedure Complications: No apparent complications Patient did tolerate procedure well. Bilateral Breath Sounds: Rhonchi Suctioning: Airway Yes   Patient extubated to Bipap per MD order.  Positive cuff leak noted.  No evidence of stridor.  Patient able to speak post extubation.  Prior to placing patient on Bipap, patient sats dropped to high 60s.  Once placed on Bipap sats improved to 97%.  No apparent complications noted at this time.  Will continue to monitor patient.  Philomena Doheny 06/10/2015, 9:47 AM

## 2015-06-11 ENCOUNTER — Inpatient Hospital Stay (HOSPITAL_COMMUNITY): Payer: Medicaid Other

## 2015-06-11 ENCOUNTER — Ambulatory Visit: Payer: Medicaid Other | Admitting: Adult Health

## 2015-06-11 LAB — CBC
HEMATOCRIT: 53.3 % — AB (ref 39.0–52.0)
Hemoglobin: 16.2 g/dL (ref 13.0–17.0)
MCH: 29.4 pg (ref 26.0–34.0)
MCHC: 30.4 g/dL (ref 30.0–36.0)
MCV: 96.7 fL (ref 78.0–100.0)
Platelets: 137 10*3/uL — ABNORMAL LOW (ref 150–400)
RBC: 5.51 MIL/uL (ref 4.22–5.81)
RDW: 14.4 % (ref 11.5–15.5)
WBC: 8.3 10*3/uL (ref 4.0–10.5)

## 2015-06-11 LAB — BASIC METABOLIC PANEL
ANION GAP: 11 (ref 5–15)
BUN: 52 mg/dL — AB (ref 6–20)
CO2: 33 mmol/L — ABNORMAL HIGH (ref 22–32)
Calcium: 8.7 mg/dL — ABNORMAL LOW (ref 8.9–10.3)
Chloride: 106 mmol/L (ref 101–111)
Creatinine, Ser: 2.5 mg/dL — ABNORMAL HIGH (ref 0.61–1.24)
GFR, EST AFRICAN AMERICAN: 33 mL/min — AB (ref >60)
GFR, EST NON AFRICAN AMERICAN: 29 mL/min — AB (ref >60)
Glucose, Bld: 170 mg/dL — ABNORMAL HIGH (ref 65–99)
POTASSIUM: 3.8 mmol/L (ref 3.5–5.1)
SODIUM: 150 mmol/L — AB (ref 135–145)

## 2015-06-11 LAB — GLUCOSE, CAPILLARY
GLUCOSE-CAPILLARY: 155 mg/dL — AB (ref 65–99)
GLUCOSE-CAPILLARY: 254 mg/dL — AB (ref 65–99)
GLUCOSE-CAPILLARY: 281 mg/dL — AB (ref 65–99)
Glucose-Capillary: 142 mg/dL — ABNORMAL HIGH (ref 65–99)
Glucose-Capillary: 403 mg/dL — ABNORMAL HIGH (ref 65–99)

## 2015-06-11 LAB — CULTURE, BLOOD (ROUTINE X 2)
CULTURE: NO GROWTH
Culture: NO GROWTH

## 2015-06-11 LAB — TRIGLYCERIDES: Triglycerides: 239 mg/dL — ABNORMAL HIGH (ref <150)

## 2015-06-11 MED ORDER — ATORVASTATIN CALCIUM 80 MG PO TABS
80.0000 mg | ORAL_TABLET | Freq: Every day | ORAL | Status: DC
  Administered 2015-06-11 – 2015-06-14 (×4): 80 mg via ORAL
  Filled 2015-06-11 (×5): qty 1

## 2015-06-11 MED ORDER — POLYETHYLENE GLYCOL 3350 17 G PO PACK
17.0000 g | PACK | Freq: Every day | ORAL | Status: DC | PRN
  Filled 2015-06-11: qty 1

## 2015-06-11 MED ORDER — HYDRALAZINE HCL 50 MG PO TABS
100.0000 mg | ORAL_TABLET | Freq: Three times a day (TID) | ORAL | Status: DC
  Filled 2015-06-11 (×2): qty 2

## 2015-06-11 MED ORDER — INSULIN ASPART 100 UNIT/ML ~~LOC~~ SOLN
10.0000 [IU] | Freq: Once | SUBCUTANEOUS | Status: AC
  Administered 2015-06-11: 10 [IU] via SUBCUTANEOUS

## 2015-06-11 MED ORDER — HYDRALAZINE HCL 50 MG PO TABS
100.0000 mg | ORAL_TABLET | Freq: Three times a day (TID) | ORAL | Status: DC
  Administered 2015-06-11 – 2015-06-14 (×10): 100 mg via ORAL
  Filled 2015-06-11 (×12): qty 2

## 2015-06-11 MED ORDER — GLUCERNA SHAKE PO LIQD
237.0000 mL | Freq: Three times a day (TID) | ORAL | Status: DC
  Administered 2015-06-11 – 2015-06-14 (×9): 237 mL via ORAL

## 2015-06-11 MED ORDER — GABAPENTIN 600 MG PO TABS
300.0000 mg | ORAL_TABLET | Freq: Three times a day (TID) | ORAL | Status: DC
  Filled 2015-06-11 (×2): qty 0.5

## 2015-06-11 MED ORDER — VERAPAMIL HCL ER 120 MG PO TBCR
120.0000 mg | EXTENDED_RELEASE_TABLET | Freq: Every day | ORAL | Status: DC
  Administered 2015-06-11 – 2015-06-14 (×4): 120 mg via ORAL
  Filled 2015-06-11 (×4): qty 1

## 2015-06-11 MED ORDER — GABAPENTIN 300 MG PO CAPS
300.0000 mg | ORAL_CAPSULE | Freq: Three times a day (TID) | ORAL | Status: DC
  Administered 2015-06-11 – 2015-06-14 (×10): 300 mg via ORAL
  Filled 2015-06-11 (×12): qty 1

## 2015-06-11 MED ORDER — FUROSEMIDE 10 MG/ML IJ SOLN
80.0000 mg | Freq: Every day | INTRAMUSCULAR | Status: DC
Start: 2015-06-12 — End: 2015-06-13
  Administered 2015-06-12 – 2015-06-13 (×2): 80 mg via INTRAVENOUS
  Filled 2015-06-11 (×2): qty 8

## 2015-06-11 MED ORDER — FENOFIBRATE 160 MG PO TABS
160.0000 mg | ORAL_TABLET | Freq: Every day | ORAL | Status: DC
  Administered 2015-06-12 – 2015-06-14 (×3): 160 mg via ORAL
  Filled 2015-06-11 (×3): qty 1

## 2015-06-11 NOTE — Progress Notes (Signed)
   06/11/15 2150  BiPAP/CPAP/SIPAP  BiPAP/CPAP/SIPAP Pt Type Adult  Mask Type Full face mask  Mask Size Large  Set Rate 0 breaths/min  Respiratory Rate 22 breaths/min  IPAP 20 cmH20  EPAP 10 cmH2O  Flow Rate 6 lpm  BiPAP/CPAP/SIPAP BiPAP  Patient Home Equipment No  Auto Titrate No  BiPAP/CPAP /SiPAP Vitals  Pulse Rate 81  Resp (!) 22  Patient placed on BIPAP on above settings per MD order. He tolerates it very well at this time.

## 2015-06-11 NOTE — Evaluation (Signed)
Physical Therapy Evaluation Patient Details Name: Jerry Cantrell MRN: [id number] DOB: Dec 31, 1965 Today's Date: 06/11/2015   History of Present Illness  49yo male with hx HTN, dCHF, OSA, cocaine abuse recent d/c 9/17 after admission for CHF exacerbation returns 10/6 with increased SOB. Initially tried on bipap in ER but failed requiring intubation. Extubated 10/09.  Clinical Impression  Patient in bed, agreeable to participate in PT today. Patient was able to ambulate and transfer as described below. Patient with noted L UE weakness (cannot actively flex elbow or shoulder, minimal grip strength) with redness, swelling, and pain upon palpation noted. He remained on 6L O2 throughout ambulation, sats >89%. Patient will benefit from continued PT to improve ambulatory endurance and independence as well as address stairs as patient's bedroom/bathroom on second floor of apartment.      Follow Up Recommendations Home health PT;Supervision for mobility/OOB    Equipment Recommendations  3in1 (PT)    Recommendations for Other Services OT consult     Precautions / Restrictions Precautions Precautions: Fall      Mobility  Bed Mobility Overal bed mobility: Needs Assistance Bed Mobility: Supine to Sit     Supine to sit: Mod assist     General bed mobility comments: 4 attempts with momentum, cues and then required assist to be able to fully elevate trunk and come to sitting  Transfers Overall transfer level: Needs assistance   Transfers: Sit to/from Stand Sit to Stand: Min assist         General transfer comment: cues for hand placement, safety and sequence with assist for anterior translation  Ambulation/Gait Ambulation/Gait assistance: Min guard Ambulation Distance (Feet): 80 Feet Assistive device: Rolling walker (2 wheeled) Gait Pattern/deviations: Step-through pattern;Decreased stride length;Trunk flexed   Gait velocity interpretation: Below normal speed for  age/gender General Gait Details: cues for posture, position in RW, safety and encouragement to increase distance  Stairs            Wheelchair Mobility    Modified Rankin (Stroke Patients Only)       Balance Overall balance assessment: Needs assistance   Sitting balance-Leahy Scale: Fair       Standing balance-Leahy Scale: Poor                               Pertinent Vitals/Pain      Home Living Family/patient expects to be discharged to:: Private residence Living Arrangements: Children Available Help at Discharge: Family;Friend(s);Available 24 hours/day Type of Home: Apartment Home Access: Stairs to enter Entrance Stairs-Rails: Right;Left;Can reach both Entrance Stairs-Number of Steps: 3 Home Layout: Two level;Bed/bath upstairs Home Equipment: Cane - single point;Walker - 2 wheels      Prior Function Level of Independence: Independent with assistive device(s)               Hand Dominance        Extremity/Trunk Assessment   Upper Extremity Assessment: LUE deficits/detail       LUE Deficits / Details: pt with painful LUE with limited ROM    Lower Extremity Assessment: Overall WFL for tasks assessed;RLE deficits/detail;LLE deficits/detail RLE Deficits / Details: 5/5 knee extension, hip flexion 3+/5, 4/5 for knee flexion LLE Deficits / Details: 5/5 knee extension, hip flexion 3+/5, 4/5 for knee flexion     Communication   Communication: Other (comment) (soft spoken after intubation)  Cognition  General Comments      Exercises        Assessment/Plan    PT Assessment Patient needs continued PT services  PT Diagnosis Difficulty walking;Generalized weakness;Acute pain   PT Problem List Decreased strength;Decreased activity tolerance;Decreased balance;Decreased knowledge of use of DME;Decreased mobility  PT Treatment Interventions Gait training;Stair training;Functional mobility  training;Therapeutic activities;Therapeutic exercise;Balance training;Patient/family education;DME instruction   PT Goals (Current goals can be found in the Care Plan section) Acute Rehab PT Goals Patient Stated Goal: be able to return home PT Goal Formulation: With patient Time For Goal Achievement: 06/25/15 Potential to Achieve Goals: Good    Frequency Min 3X/week   Barriers to discharge        Co-evaluation               End of Session Equipment Utilized During Treatment: Gait belt;Oxygen Activity Tolerance: Patient tolerated treatment well Patient left: in bed;with call bell/phone within reach;with bed alarm set           Time: 1400-1430 PT Time Calculation (min) (ACUTE ONLY): 30 min   Charges:   PT Evaluation $Initial PT Evaluation Tier I: 1 Procedure PT Treatments $Therapeutic Activity: 8-22 mins   PT G CodesRoanna Epley, SPT [phone] 06/11/2015, 2:41 PM

## 2015-06-11 NOTE — Progress Notes (Signed)
Family Medicine Teaching Service Daily Progress Note Intern Pager: [phone]  Patient name: Jerry Cantrell Medical record number: [id number] Date of birth: [birth year] Age: 49 y.o. Gender: male  Primary Care Provider: Helane Rima, MD Consultants: Code Status: Full   Pt Overview and Major Events to Date:  - Resumed Care from [facility] 06/12/2015  Assessment and Plan: Jerry Cantrell is a 49 y.o. male presenting with respiratory failure due CHF exacerbation with initial admission to CCM . PMH is significant for HTN, CHF, CKD3, OSA, T2DM, Hx of Cocaine abuse, Tobacco use   Resolved Acute respiratory failure: No infectious causes found  Bcx, urine cultures, Respiratory aspirate. Patient likely respiratory failure due to cocaine use with positive tox screen for positive.  - Continue to follow vitals - Continue [place]bs q6h  Severe OSA: BiPAP at night for severe OSA - Will continue BIPAP  Left Arm Swollen: left arm swollen, pain with movement, upper left arm erythematous, pulses 2+, no changes in sensation of arm,  Consider DVT vs Thrombophlebitis vs Cellulitis vs Compartment syndrome  - Will get  U/S Extremity left upper - Will continue to watch and reassess   Acute on chronic HRpEF, diastolic dysfunction: ECHO 10/7.  Likely exacerbated by cocaine abuse. (10/8) CXR  right basilar opacity is noted concerning for pneumonia or atelectasis with possible associated pleural effusion --> . Persistent changes of congestive heart failure pulmonary edema. No interim change. (10/11) - Lasix 80mg  IV daily  AKI SrCr 3.62 >>2.5 - Will recheck BMET tomorrow    HTN: BP 169/86   - Resume home PO hydralazine, clonidine, verapamil  - IV hydralazine prn SBP >180   HLD  Cont home fenofibtrate, lipitor  T2DM HA1C 8.9   - Lantus 10 units  - SSI, moderate.    Cocaine and Tobacco abuse:  - Consult social work, patient interested in quitting   FEN/GI: PPI, Carb modified  Prophylaxis: Lovenox    Disposition: Telemetry   Subjective:  Patient continues to be SOB at times. However improving. Swelling in his left arm.   Objective: Temp:  [98 F (36.7 C)-98.9 F (37.2 C)] 98.9 F (37.2 C) (10/11 1628) Pulse Rate:  [80-111] 87 (10/11 1628) Resp:  [17-27] 19 (10/11 1628) BP: (144-208)/(80-109) 169/86 mmHg (10/11 1628) SpO2:  [88 %-100 %] 90 % (10/11 1628) FiO2 (%):  [55 %-100 %] 55 % (10/11 0732) Weight:  [281 lb 15.5 oz (127.9 kg)] 281 lb 15.5 oz (127.9 kg) (10/11 1249) Physical Exam: General: Laying in bed, with Nasula Cannula (6> 4 liters this AM)  Cardiovascular: RRR, no murmurs  Respiratory:  Left lobe with significant rhonchi in the left upper and lower lobe, right lung clear   Abdomen: BS+ no distention, no tttp  Extremities: No lower extremity edema, left arm swollen, pain with movement, upper left arm erythematous, pulses 2+, no changes in sensation of arm,   Laboratory:  Recent Labs Lab 06/09/15 0311 06/10/15 0258 06/11/15 0239  WBC 6.8 6.5 8.3  HGB 14.5 13.9 16.2  HCT 45.7 45.7 53.3*  PLT 150 148* 137*    Recent Labs Lab 06/06/15 1413  06/09/15 0311 06/10/15 0258 06/11/15 0239  NA 139  < > 143 147* 150*  K 4.3  < > 3.5 3.8 3.8  CL 101  < > 101 108 106  CO2 27  < > 30 30 33*  BUN 25*  < > 65* 68* 52*  CREATININE 2.12*  < > 3.07* 2.67* 2.50*  CALCIUM 8.7*  < > 8.3*  8.0* 8.7*  PROT 5.6*  --   --   --   --   BILITOT 0.2*  --   --   --   --   ALKPHOS 84  --   --   --   --   ALT 12*  --   --   --   --   AST 16  --   --   --   --   GLUCOSE 286*  < > 214* 169* 170*  < > = values in this interval not displayed.   Imaging/Diagnostic Tests: Dg Chest 2 View  05/15/2015   CLINICAL DATA:  Shortness of Breath  EXAM: CHEST  2 VIEW  COMPARISON:  May 13, 2015  FINDINGS: There is slightly less consolidation in the right base. There is a persistent right effusion. There is mild atelectasis in the left mid lower lung zones. There is also mild atelectasis  in the right mid lung which is stable. No new opacities apparent. Heart is mildly enlarged with pulmonary vascularity within normal limits. No adenopathy. There is degenerative change in the thoracic spine.  IMPRESSION: Slightly less consolidation right base. Right effusion is not felt to be appreciably changed. Patchy atelectasis bilaterally is stable. There is stable cardiomegaly. No new opacity.   Electronically Signed   By: Lowella Grip III M.D.   On: 05/15/2015 08:08   Dg Chest 2 View  05/13/2015   CLINICAL DATA:  Shortness of breath intermittently getting worse for few weeks. Chest discomfort.  EXAM: CHEST  2 VIEW  COMPARISON:  Chest x-rays dated 02/05/2015 and 01/19/2015.  FINDINGS: Compared to the most recent study of 02/05/2015, there is a new dense opacity at the right lung base which could represent pneumonia or atelectasis with adjacent pleural effusion. There is improved aeration within the right upper lobe compatible with continued improvement of patient's previously described right upper lobe pneumonia.  There is mild interstitial prominence bilaterally suggesting edema. Linear atelectasis versus chronic scarring again noted within the left mid lung region. No pneumothorax. Cardiomediastinal silhouette appears stable in size and configuration. No acute osseous abnormality.  IMPRESSION: New dense opacity at the right lung base, centered within the right middle lobe, which could represent pneumonia or atelectasis. Aspiration also possible. There is also a right pleural effusion which is probably small.  Cardiomegaly with central pulmonary vascular congestion and mild bilateral interstitial edema suggesting some degree of volume overload/ CHF.  Continued improvement of the previously described right upper lobe pneumonia. I suspect that the previous right upper lobe pneumonia has completely resolved and that the linear opacities at the periphery of the right upper lobe are areas of chronic  scarring (sequela of the previous pneumonia).   Electronically Signed   By: Franki Cabot M.D.   On: 05/13/2015 13:12   Dg Abd 1 View  06/06/2015   CLINICAL DATA:  Orogastric tube placement.  EXAM: ABDOMEN - 1 VIEW  COMPARISON:  None.  FINDINGS: Orogastric tube has been placed, tip overlying the level of the mid stomach. Bowel gas pattern is nonobstructive. Note is made of dense opacity at the lung bases bilaterally.  IMPRESSION: Interval placement of orogastric tube.   Electronically Signed   By: Nolon Nations M.D.   On: 06/06/2015 16:45   Ct Head Wo Contrast  06/06/2015   CLINICAL DATA:  Altered mental status.  EXAM: CT HEAD WITHOUT CONTRAST  TECHNIQUE: Contiguous axial images were obtained from the base of the skull through the vertex  without intravenous contrast.  COMPARISON:  01/18/15.  FINDINGS: There is no intracranial hemorrhage, mass or extra-axial fluid collection. No evidence of acute infarction. Mild generalized atrophy is present. Gray matter and white matter are unremarkable, with normal differentiation. No acute findings are evident. Calvarium and skullbase are intact. There are air-fluid levels in the maxillary sinuses bilaterally and also in the left sphenoid sinus. Moderate membrane thickening in the ethmoid air cells and in the right sphenoid sinus. There is fluid in the mastoid air cells bilaterally.  IMPRESSION: No acute intracranial findings.  Mild generalized atrophy.  Paranasal sinus disease, with air-fluid levels in the maxillary sinuses and left sphenoid. There also are mastoid effusions bilaterally.   Electronically Signed   By: Andreas Newport M.D.   On: 06/06/2015 23:22   Ct Chest Wo Contrast  05/15/2015   CLINICAL DATA:  CHF, pleural effusion, hypertension, hyperlipidemia, diabetes mellitus with nephropathy, acute diastolic heart failure/CHF  EXAM: CT CHEST WITHOUT CONTRAST  TECHNIQUE: Multidetector CT imaging of the chest was performed following the standard protocol  without IV contrast. Sagittal and coronal MPR images reconstructed from axial data set.  COMPARISON:  03/05/2010  FINDINGS: Aorta normal caliber.  Calcified RIGHT hilar and subcarinal lymph nodes.  Additional noncalcified mediastinal nodes largest RIGHT peritracheal 11 mm diameter image 22 and LEFT paratracheal 12 mm diameter.  Visualized upper abdomen unremarkable.  Heart enlarged.  Small to moderate RIGHT pleural effusion.  Calcified granulomata RIGHT upper and RIGHT lower lobes.  Atelectasis of RIGHT lower lobe.  5 mm slightly ill-defined nodule inferior RIGHT upper lobe image 26 unchanged.  Posterior RIGHT upper lobe nodule 4 mm diameter image 16 unchanged.  Mild compressive atelectasis at posterior aspects of the RIGHT middle and RIGHT upper lobes.  Focal area of slightly nodular scarring antral lateral RIGHT middle lobe base image 40 unchanged.  Dependent atelectasis LEFT lower lobe.  5 mm LEFT lower lobe nodule image 37 not definitely seen previously.  No acute infiltrate or pneumothorax.  Scattered endplate spur formation throughout mid to inferior thoracic spine.  IMPRESSION: Small to moderate RIGHT pleural effusion.  Old granulomatous disease.  Scattered atelectasis greatest in RIGHT lower lobe.  BILATERAL pulmonary nodules, some stable, 5 mm nodule LEFT lower lobe not definitely previously visualized; recommendation below.  If the patient is at high risk for bronchogenic carcinoma, follow-up chest CT at 6-12 months is recommended. If the patient is at low risk for bronchogenic carcinoma, follow-up chest CT at 12 months is recommended. This recommendation follows the consensus statement: Guidelines for Management of Small Pulmonary Nodules Detected on CT Scans: A Statement from the [place] as published in Radiology 2005;237:395-400.   Electronically Signed   By: Lavonia Dana M.D.   On: 05/15/2015 13:40   Dg Chest Port 1 View  06/11/2015   CLINICAL DATA:  Shortness of breath.  EXAM: PORTABLE  CHEST 1 VIEW  COMPARISON:  06/10/2015.  FINDINGS: Interim extubation and removal of NG tube. Cardiomegaly with persistent bilateral pulmonary infiltrates consistent pulmonary edema. No prompt pleural effusion or pneumothorax.  IMPRESSION: 1.  Interim extubation and removal of NG tube.  2. Persistent changes of congestive heart failure pulmonary edema. No interim change.   Electronically Signed   By: Marcello Moores  Register   On: 06/11/2015 07:24   Dg Chest Port 1 View  06/10/2015   CLINICAL DATA:  Hypoxia.  Cocaine abuse.  EXAM: PORTABLE CHEST 1 VIEW  COMPARISON:  June 09, 2015  FINDINGS: Endotracheal tube tip is 3.9 cm  above the carina. Nasogastric tube tip and side port are below the diaphragm. No pneumothorax. There is interstitial edema with bilateral effusions, stable. There is airspace consolidation in the left base, stable. No new opacity. Heart is enlarged. Pulmonary vascularity is within normal limits. No adenopathy appreciable.  IMPRESSION: Tube positions as described without pneumothorax. Findings felt to represent a degree of congestive heart failure. Superimposed pneumonia in the left base cannot be excluded. The overall appearance is stable compared to 1 day prior.   Electronically Signed   By: Lowella Grip III M.D.   On: 06/10/2015 07:11   Dg Chest Port 1 View  06/09/2015   CLINICAL DATA:  Shortness of breath.  Ventilator support.  EXAM: PORTABLE CHEST 1 VIEW  COMPARISON:  06/08/2015 and multiple previous  FINDINGS: Endotracheal tube has its tip 7.5 cm above the carina. Nasogastric tube enters stomach. Bilateral effusions with volume loss/ pneumonia in both lower lobes. Some worsening over the last several films, particularly at the left base.  IMPRESSION: Effusions and basilar volume loss left worse than right. Worsening over the last several films.   Electronically Signed   By: Nelson Chimes M.D.   On: 06/09/2015 08:19   Dg Chest Port 1 View  06/08/2015   CLINICAL DATA:  Followup pulmonary  edema and ventilator support.  EXAM: PORTABLE CHEST 1 VIEW  COMPARISON:  06/06/2015 and previous  FINDINGS: The tracheal tube has its tip 6 cm above the carina. Nasogastric tube enters the stomach. Effusions persist larger on the right than the left with lower lobe volume loss. There is mild edema, improving over time.  IMPRESSION: Slowly improving pulmonary edema. Effusions and lower lobe volume loss right more than left.   Electronically Signed   By: Nelson Chimes M.D.   On: 06/08/2015 09:20   Dg Chest Port 1 View  06/06/2015   CLINICAL DATA:  Shortness of Breath for 2 days. History of congestive heart failure  EXAM: PORTABLE CHEST 1 VIEW  COMPARISON:  Chest radiograph and chest CT May 15, 2015  FINDINGS: There is interstitial edema with cardiomegaly and pulmonary venous hypertension. There is consolidation in the right base with right effusion. There is elevation of the right hemidiaphragm as well. No adenopathy.  IMPRESSION: Evidence of congestive heart failure. Suspect superimposed pneumonia right base. Stable elevation right hemidiaphragm.   Electronically Signed   By: Lowella Grip III M.D.   On: 06/06/2015 13:57   Dg Chest Port 1v Same Day  06/06/2015   CLINICAL DATA:  Encounter for intubation.  EXAM: PORTABLE CHEST 1 VIEW  COMPARISON:  Same day.  FINDINGS: Stable cardiomegaly. No pneumothorax is noted. Endotracheal tube is in grossly good position with distal tip 5 cm above the carina. Orogastric tube is seen entering stomach. Mild central pulmonary vascular congestion is noted. Stable right basilar opacity is noted concerning for pneumonia or atelectasis with possible associated pleural effusion.  IMPRESSION: Endotracheal and orogastric tubes in grossly good position. Stable cardiomegaly and central pulmonary vascular congestion. Stable right basilar opacity as described above.   Electronically Signed   By: Marijo Conception, M.D.   On: 06/06/2015 15:57   Dg Abd Portable 1v  06/07/2015    CLINICAL DATA:  Status post OG tube placement.  EXAM: PORTABLE ABDOMEN - 1 VIEW  COMPARISON:  06/07/2015  FINDINGS: The enteric tube has been advanced and appears to be folded upon itself within the distal aspect of the stomach/proximal duodenum. Nonobstructed bowel gas pattern. Motion artifact limits exam.  IMPRESSION: Interval advancement of the enteric tube which appears to be coiled upon itself predominately located within the stomach and proximal duodenum.   Electronically Signed   By: Lovey Newcomer M.D.   On: 06/07/2015 17:46   Dg Abd Portable 1v  06/07/2015   CLINICAL DATA:  Orogastric tube placement.  EXAM: PORTABLE ABDOMEN - 1 VIEW  COMPARISON:  06/06/2015  FINDINGS: Previously-seen enteric catheter has now folded on itself with tip within the expected location of the distal esophagus and side hole at the expected location of the gastroesophageal junction. The bowel gas pattern is normal. No radio-opaque calculi or other significant radiographic abnormality are seen.  IMPRESSION: Enteric catheter folded on itself with tip within the expected location of the distal esophagus. Readjustment is suggested.  These results will be called to the ordering clinician or representative by the Radiologist Assistant, and communication documented in the PACS or zVision Dashboard.   Electronically Signed   By: Fidela Salisbury M.D.   On: 06/07/2015 16:33   Dg Abd Portable 1v  06/06/2015   CLINICAL DATA:  Status post OG tube placement  EXAM: PORTABLE ABDOMEN - 1 VIEW  COMPARISON:  06/06/2015  FINDINGS: Orogastric catheter is again identified within the stomach. The overall position is unchanged. No acute obstructive changes are noted.  IMPRESSION: Orogastric catheter within the stomach.   Electronically Signed   By: Inez Catalina M.D.   On: 06/06/2015 19:57     Aiko Belko Cletis Media, MD 06/11/2015, 5:07 PM PGY-1, [place] Intern pager: [phone], text pages welcome

## 2015-06-11 NOTE — Progress Notes (Signed)
PULMONARY / CRITICAL CARE MEDICINE   Name: Jerry Cantrell MRN: [id number] DOB: [birth year]    ADMISSION DATE:  06/06/2015  REFERRING MD :  EDP  CHIEF COMPLAINT:  Respiratory failure  INITIAL PRESENTATION: 49yo male with hx HTN, dCHF, OSA, cocaine abuse recent d/c 9/17 after admission for CHF exacerbation returns 10/6 with increased SOB. Initially tried on bipap in ER but failed requiring intubation. PCCM consulted for admission.   STUDIES:  2D echo 10/6>>> CT head 10/6 >>> No acute findings CXR 10/6 >>> interstitial edema  SIGNIFICANT EVENTS: 10/6- intubated 10/7- overdiuresis 10/10 - Extubated to BiPAP  SUBJECTIVE:  Extubated to BiPAP yesterday. Shortness of breath improved. No chest pain.  VITAL SIGNS: Temp:  [98 F (36.7 C)-99 F (37.2 C)] 98.6 F (37 C) (10/11 0315) Pulse Rate:  [77-111] 84 (10/11 0600) Resp:  [17-29] 18 (10/11 0600) BP: (146-221)/(75-110) 160/89 mmHg (10/11 0600) SpO2:  [88 %-97 %] 97 % (10/11 0600) FiO2 (%):  [40 %-100 %] 60 % (10/11 0600) Weight:  [281 lb 15.5 oz (127.9 kg)] 281 lb 15.5 oz (127.9 kg) (10/11 0334) HEMODYNAMICS:   VENTILATOR SETTINGS: Vent Mode:  [-] PCV;CPAP FiO2 (%):  [40 %-100 %] 60 % Set Rate:  [8 bmp] 8 bmp PEEP:  [5 cmH20-10 cmH20] 10 cmH20 Pressure Support:  [5 cmH20-15 cmH20] 5 cmH20 INTAKE / OUTPUT:  Intake/Output Summary (Last 24 hours) at 06/11/15 0706 Last data filed at 06/11/15 0600  Gross per 24 hour  Intake    657 ml  Output   7255 ml  Net  -6598 ml    PHYSICAL EXAMINATION: General: Obese male in NAD Neuro: Awake, alert, follows commands HEENT:jvd wnl Cardiovascular: RRR s1 s2  Lungs: ronchi resolving Abdomen: Obese, round, soft, +bs  Musculoskeletal: Warm and dry, 1+ BLE edema, nonpitting  LABS:  CBC  Recent Labs Lab 06/09/15 0311 06/10/15 0258 06/11/15 0239  WBC 6.8 6.5 8.3  HGB 14.5 13.9 16.2  HCT 45.7 45.7 53.3*  PLT 150 148* 137*   Coag's No results for input(s): APTT, INR  in the last 168 hours. BMET  Recent Labs Lab 06/09/15 0311 06/10/15 0258 06/11/15 0239  NA 143 147* 150*  K 3.5 3.8 3.8  CL 101 108 106  CO2 30 30 33*  BUN 65* 68* 52*  CREATININE 3.07* 2.67* 2.50*  GLUCOSE 214* 169* 170*   Electrolytes  Recent Labs Lab 06/07/15 0235  06/09/15 0311 06/10/15 0258 06/11/15 0239  CALCIUM 8.6*  < > 8.3* 8.0* 8.7*  MG 1.9  --  1.9  --   --   PHOS 2.8  --  4.9*  --   --   < > = values in this interval not displayed. Sepsis Markers  Recent Labs Lab 06/06/15 1501  LATICACIDVEN 0.99   ABG  Recent Labs Lab 06/06/15 1501 06/06/15 1657 06/08/15 0555  PHART 7.271* 7.396 7.466*  PCO2ART 70.8* 50.2* 38.2  PO2ART 60.0* 74.0* 74.9*   Liver Enzymes  Recent Labs Lab 06/06/15 1413 06/07/15 0235  AST 16  --   ALT 12*  --   ALKPHOS 84  --   BILITOT 0.2*  --   ALBUMIN 2.3* 2.0*   Cardiac Enzymes  Recent Labs Lab 06/07/15 1246 06/07/15 1857 06/07/15 2300  TROPONINI 0.03 0.03 0.03   Glucose  Recent Labs Lab 06/09/15 2338 06/10/15 0728 06/10/15 0906 06/10/15 1958 06/11/15 0004 06/11/15 0313  GLUCAP 165* 197* 187* 68 142* 155*    Imaging No results found.   ASSESSMENT /  PLAN:  PULMONARY OETT 10/6>>> Acute respiratory failure - r/t acute on chronic dCHF with underling Severe OSA  Tobacco abuse  P:  Control afterload Duonebs Some concern aspiration with predominant rt side infiltrate  Doing well on nasal canula  Will need BiPAP at night for severe OSA  CARDIOVASCULAR Acute on chronic dCHF - likely exacerbated by cocaine abuse  Cocaine abuse and intox on admission HTN urgency , highest BP 244/152 > MAP 180 Respiratory acidosis  P:  25%-35% reduction MAP goals , met Avoid B blocker for unopposed alpha risk ASA  Resume home PO hydralazine, clonidine, verapamil  Increase PO meds as needed IV hydralazine prn SBP >180 Cont home fenofibtrate, lipitor Echo: LVH, diastolic, no per eff  RENAL AKI -  Improving Volume overload P:  Daily BMP Lasix 71m daily  GASTROINTESTINAL NO active issue  P:  Monitor  HEMATOLOGIC Secondary polycythemia P:  SQ heparin for VTE prophylaxis   INFECTIOUS No obvious source infection, at risk asp pna P:  Monitor fever  Bcx 10/7 >>> Respiratory aspirate 10/7 >>>  ENDOCRINE DM  Hypoglycemia - likely due to being NPO P:  Half home lantus to 10units  SSI    NEUROLOGIC AMS - likely r/t respiratory failure/ hypercarbia but also with SIGNIFICANT HTN, improving H/o meralgia paresthetica/neuropathy P:  Monitor PT eval and treat  FAMILY  - Updates: no fam   Naomi Fitton M. PJerline Pain MDiabloResident PGY-2 06/11/2015 7:06 AM

## 2015-06-11 NOTE — Progress Notes (Signed)
Pt transferred from 57M, c/o pain to Left AC r/t IV removal site.  Currently covered with Tegaderm.  Pt states that he cannot move his LUE.  Text paged physician at [phone] to notify, and awaiting orders.

## 2015-06-11 NOTE — Care Management Note (Addendum)
Case Management Note  Patient Details  Name: Maxmilian Grannan MRN: [id number] Date of Birth: 08-25-1966  Subjective/Objective:       Lives at home with son, who is in his 22's and does not work at this time.  Does have a bipap - has had for about 2 weeks through [facility].  States the machine does not work right.  States also wants a hoover round. States has difficulty walking.  Awaiting call back from [facility] DME.   Talked with [facility] - state he needs to bring bipap machine into office on [employer], and will need just an order for hoover round from PCP - which he has - sent to [facility] office and then someone will come out to his house to work with him on this.  Will update him.  Patient has transferred to 3E07.              Action/Plan:   Expected Discharge Date:  06/14/15               Expected Discharge Plan:  Home/Self Care  In-House Referral:     Discharge planning Services  CM Consult  Post Acute Care Choice:    Choice offered to:     DME Arranged:    DME Agency:     HH Arranged:    HH Agency:     Status of Service:  In process, will continue to follow  Medicare Important Message Given:    Date Medicare IM Given:    Medicare IM give by:    Date Additional Medicare IM Given:    Additional Medicare Important Message give by:     If discussed at [place] of Stay Meetings, dates discussed:    Additional Comments:  Vergie Living, RN 06/11/2015, 10:18 AM

## 2015-06-11 NOTE — Progress Notes (Signed)
Nutrition Follow-up  DOCUMENTATION CODES:   Obesity unspecified  INTERVENTION:   Provide Glucerna Shake po TID, each supplement provides 220 kcal and 10 grams of protein.  Encourage adequate PO intake.   NUTRITION DIAGNOSIS:   Inadequate oral intake related to poor appetite as evidenced by meal completion < 50%; ongoing  GOAL:   Patient will meet greater than or equal to 90% of their needs; not met  MONITOR:   PO intake, Supplement acceptance, Weight trends, Labs, I & O's  REASON FOR ASSESSMENT:   Consult Enteral/tube feeding initiation and management  ASSESSMENT:   49yo male with hx HTN, dCHF, OSA, cocaine abuse recent d/c 9/17 after admission for CHF exacerbation returns 10/6 with increased SOB. Initially tried on bipap in ER but failed requiring intubation.  Pt extubated on 10/10. Meal completion has been 10%. Pt reports having a lack of appetite and was only able to drink his juice and coffee at breakfast time. Pt is agreeable to protein supplements to aid in caloric and protein needs. RD to order. Pt was encouraged to eat his food at meals and to drink his supplements.   Labs: Low calcium and GFR. High BUN, creatinine, CO2, sodium.  Diet Order:  Diet heart healthy/carb modified Room service appropriate?: Yes; Fluid consistency:: Thin  Skin:   (+2 generalized edema)  Last BM:  10/10  Height:   Ht Readings from Last 1 Encounters:  06/11/15 6' 1"  (1.854 m)    Weight:   Wt Readings from Last 1 Encounters:  06/11/15 281 lb 15.5 oz (127.9 kg)    Ideal Body Weight:  83.6 kg  BMI:  Body mass index is 37.21 kg/(m^2).  Estimated Nutritional Needs:   Kcal:  2150-2350  Protein:  110-130 grams  Fluid:  2.1 - 2.3 L/day  EDUCATION NEEDS:   No education needs identified at this time  Corrin Parker, MS, RD, LDN Pager # [phone] After hours/ weekend pager # [phone]

## 2015-06-11 NOTE — Progress Notes (Signed)
Upon arrival to patient room, to give scheduled nebulizer treatment, patient was off of Bipap and on non-rebreather mask.  Patient sats were 99% and tolerating well.  Will continue to monitor.

## 2015-06-12 ENCOUNTER — Inpatient Hospital Stay (HOSPITAL_COMMUNITY): Payer: Medicaid Other

## 2015-06-12 DIAGNOSIS — Z0189 Encounter for other specified special examinations: Secondary | ICD-10-CM | POA: Insufficient documentation

## 2015-06-12 DIAGNOSIS — I82402 Acute embolism and thrombosis of unspecified deep veins of left lower extremity: Secondary | ICD-10-CM

## 2015-06-12 LAB — CBC
HEMATOCRIT: 50.3 % (ref 39.0–52.0)
HEMATOCRIT: 48.9 % (ref 39.0–52.0)
HEMOGLOBIN: 15.4 g/dL (ref 13.0–17.0)
Hemoglobin: 14.9 g/dL (ref 13.0–17.0)
MCH: 29.4 pg (ref 26.0–34.0)
MCH: 28.8 pg (ref 26.0–34.0)
MCHC: 30.6 g/dL (ref 30.0–36.0)
MCHC: 30.5 g/dL (ref 30.0–36.0)
MCV: 96.2 fL (ref 78.0–100.0)
MCV: 94.6 fL (ref 78.0–100.0)
PLATELETS: 152 10*3/uL (ref 150–400)
Platelets: 140 10*3/uL — ABNORMAL LOW (ref 150–400)
RBC: 5.23 MIL/uL (ref 4.22–5.81)
RBC: 5.17 MIL/uL (ref 4.22–5.81)
RDW: 14 % (ref 11.5–15.5)
RDW: 13.7 % (ref 11.5–15.5)
WBC: 7.5 10*3/uL (ref 4.0–10.5)
WBC: 7.6 10*3/uL (ref 4.0–10.5)

## 2015-06-12 LAB — GLUCOSE, CAPILLARY
GLUCOSE-CAPILLARY: 126 mg/dL — AB (ref 65–99)
GLUCOSE-CAPILLARY: 63 mg/dL — AB (ref 65–99)
GLUCOSE-CAPILLARY: 174 mg/dL — AB (ref 65–99)
Glucose-Capillary: 296 mg/dL — ABNORMAL HIGH (ref 65–99)
Glucose-Capillary: 344 mg/dL — ABNORMAL HIGH (ref 65–99)
Glucose-Capillary: 311 mg/dL — ABNORMAL HIGH (ref 65–99)

## 2015-06-12 LAB — BASIC METABOLIC PANEL
ANION GAP: 15 (ref 5–15)
Anion gap: 14 (ref 5–15)
BUN: 47 mg/dL — ABNORMAL HIGH (ref 6–20)
BUN: 47 mg/dL — AB (ref 6–20)
CHLORIDE: 100 mmol/L — AB (ref 101–111)
CO2: 32 mmol/L (ref 22–32)
CO2: 29 mmol/L (ref 22–32)
CREATININE: 2.3 mg/dL — AB (ref 0.61–1.24)
Calcium: 8.8 mg/dL — ABNORMAL LOW (ref 8.9–10.3)
Calcium: 8.6 mg/dL — ABNORMAL LOW (ref 8.9–10.3)
Chloride: 97 mmol/L — ABNORMAL LOW (ref 101–111)
Creatinine, Ser: 2.42 mg/dL — ABNORMAL HIGH (ref 0.61–1.24)
GFR calc non Af Amer: 32 mL/min — ABNORMAL LOW (ref >60)
GFR, EST AFRICAN AMERICAN: 37 mL/min — AB (ref >60)
GFR, EST AFRICAN AMERICAN: 34 mL/min — AB (ref >60)
GFR, EST NON AFRICAN AMERICAN: 30 mL/min — AB (ref >60)
Glucose, Bld: 124 mg/dL — ABNORMAL HIGH (ref 65–99)
Glucose, Bld: 390 mg/dL — ABNORMAL HIGH (ref 65–99)
POTASSIUM: 3.6 mmol/L (ref 3.5–5.1)
POTASSIUM: 4.2 mmol/L (ref 3.5–5.1)
SODIUM: 140 mmol/L (ref 135–145)
Sodium: 147 mmol/L — ABNORMAL HIGH (ref 135–145)

## 2015-06-12 MED ORDER — SODIUM CHLORIDE 0.9 % IV SOLN
1500.0000 mg | INTRAVENOUS | Status: DC
  Filled 2015-06-12: qty 1500

## 2015-06-12 MED ORDER — ALBUTEROL SULFATE (2.5 MG/3ML) 0.083% IN NEBU
2.5000 mg | INHALATION_SOLUTION | Freq: Four times a day (QID) | RESPIRATORY_TRACT | Status: DC | PRN
  Administered 2015-06-14: 2.5 mg via RESPIRATORY_TRACT
  Filled 2015-06-12: qty 3

## 2015-06-12 MED ORDER — ACETAMINOPHEN 325 MG PO TABS
650.0000 mg | ORAL_TABLET | Freq: Four times a day (QID) | ORAL | Status: DC | PRN
  Administered 2015-06-12: 650 mg via ORAL
  Filled 2015-06-12: qty 2

## 2015-06-12 MED ORDER — LORATADINE 10 MG PO TABS
10.0000 mg | ORAL_TABLET | Freq: Every day | ORAL | Status: DC
  Administered 2015-06-12 – 2015-06-14 (×3): 10 mg via ORAL
  Filled 2015-06-12 (×3): qty 1

## 2015-06-12 MED ORDER — FLUTICASONE PROPIONATE 50 MCG/ACT NA SUSP
2.0000 | Freq: Every day | NASAL | Status: DC
  Administered 2015-06-12 – 2015-06-14 (×3): 2 via NASAL
  Filled 2015-06-12: qty 16

## 2015-06-12 NOTE — Progress Notes (Signed)
Pt can place on himself when ready. No distress noted at this time. Pt encouraged to call RT if needing any assistance.

## 2015-06-12 NOTE — Progress Notes (Signed)
Family continues to supplement pt's diet with non-diabetic items brought from home.  Have educated yet pt and family remain non-compliant.

## 2015-06-12 NOTE — Care Management Note (Signed)
Case Management Note  Patient Details  Name: Reyna Hameed MRN: [id number] Date of Birth: May 18, 1966  Subjective/Objective:         Admitted with HTN urgency, CHF           Action/Plan: Patient lives with his son, has a walker, CPAP and cane at home. Has Medicaid for medical insurance with prescription drug coverage; pharmacy of choice is [facility] - no problems getting his medication. Patient stated " all of my medicine is $3.00 or less. Patient could benefit from a Disease Management Program for CHF; [facility] choice offered, patient chose [facility] for [facility] services. Mary with [facility] called for arrangements.   Expected Discharge Date:  06/14/15               Expected Discharge Plan:  [facility]  Discharge planning Services  CM Consult Choice offered to:  Patient  HH Arranged:  RN, Disease Management, PT [place] Agency:  [facility]  Status of Service:  In process, will continue to follow  Sherrilyn Rist [phone] 06/12/2015, 10:23 AM

## 2015-06-12 NOTE — Progress Notes (Signed)
Decreased fio2 due to stable sats

## 2015-06-12 NOTE — Progress Notes (Signed)
Inpatient Diabetes Program Recommendations  AACE/ADA: New Consensus Statement on Inpatient Glycemic Control (2015)  Target Ranges:  Prepandial:   less than 140 mg/dL      Peak postprandial:   less than 180 mg/dL (1-2 hours)      Critically ill patients:  140 - 180 mg/dL   Review of Glycemic Control  Inpatient Diabetes Program Recommendations:  Correction (SSI): change Novolog correction to TID (to coincide with meal coverage TID) Thank you  Raoul Pitch BSN, RN,CDE Inpatient Diabetes Coordinator [phone] (team pager)

## 2015-06-12 NOTE — Progress Notes (Signed)
Preliminary results by tech - Left upper extremity venous duplex completed. Positive for a superficial vein thrombus involving the cephalic vein. All other veins are patient with no evidence of thrombosis. Results given to patient's nurse. Oda Cogan, BS, RDMS, RVT

## 2015-06-12 NOTE — Consult Note (Signed)
WOC wound consult note Reason for Consult: Consult requested for left arm.  Pt states he had an IV here when he was treated by EMS. Wound type: Left arm with generalized edema and pain when it is touched; left anticubital area has a partial thickness wound which appears to be where a previous blister has ruptured. Measurement: 4X3X.1cm Wound bed: Dark red and moist Drainage (amount, consistency, odor) No odor or drainage Periwound: Dark red skin surrounding wound; no blisters, necrotic tissue, or eschar noted. Dressing procedure/placement/frequency: Foam dressing to protect and promote healing. Primary team can continue diagnostic tests if desired to determine source of arm swelling and pain. Please re-consult if further assistance is needed.  Thank-you,  Julien Girt MSN, [place], [place], [place], [place]

## 2015-06-13 DIAGNOSIS — E1110 Type 2 diabetes mellitus with ketoacidosis without coma: Secondary | ICD-10-CM | POA: Insufficient documentation

## 2015-06-13 DIAGNOSIS — R739 Hyperglycemia, unspecified: Secondary | ICD-10-CM | POA: Insufficient documentation

## 2015-06-13 DIAGNOSIS — I10 Essential (primary) hypertension: Secondary | ICD-10-CM | POA: Insufficient documentation

## 2015-06-13 DIAGNOSIS — F17200 Nicotine dependence, unspecified, uncomplicated: Secondary | ICD-10-CM | POA: Insufficient documentation

## 2015-06-13 DIAGNOSIS — E1310 Other specified diabetes mellitus with ketoacidosis without coma: Secondary | ICD-10-CM

## 2015-06-13 LAB — BASIC METABOLIC PANEL
ANION GAP: 11 (ref 5–15)
BUN: 49 mg/dL — ABNORMAL HIGH (ref 6–20)
CHLORIDE: 98 mmol/L — AB (ref 101–111)
CO2: 32 mmol/L (ref 22–32)
Calcium: 8.8 mg/dL — ABNORMAL LOW (ref 8.9–10.3)
Creatinine, Ser: 2.34 mg/dL — ABNORMAL HIGH (ref 0.61–1.24)
GFR calc Af Amer: 36 mL/min — ABNORMAL LOW (ref >60)
GFR, EST NON AFRICAN AMERICAN: 31 mL/min — AB (ref >60)
GLUCOSE: 521 mg/dL — AB (ref 65–99)
POTASSIUM: 4 mmol/L (ref 3.5–5.1)
Sodium: 141 mmol/L (ref 135–145)

## 2015-06-13 LAB — CBC
HEMATOCRIT: 48.6 % (ref 39.0–52.0)
HEMOGLOBIN: 14.5 g/dL (ref 13.0–17.0)
MCH: 28.3 pg (ref 26.0–34.0)
MCHC: 29.8 g/dL — ABNORMAL LOW (ref 30.0–36.0)
MCV: 94.7 fL (ref 78.0–100.0)
PLATELETS: 142 10*3/uL — AB (ref 150–400)
RBC: 5.13 MIL/uL (ref 4.22–5.81)
RDW: 13.6 % (ref 11.5–15.5)
WBC: 6 10*3/uL (ref 4.0–10.5)

## 2015-06-13 LAB — GLUCOSE, CAPILLARY
GLUCOSE-CAPILLARY: 322 mg/dL — AB (ref 65–99)
GLUCOSE-CAPILLARY: 254 mg/dL — AB (ref 65–99)
Glucose-Capillary: 501 mg/dL — ABNORMAL HIGH (ref 65–99)

## 2015-06-13 MED ORDER — NICOTINE 21 MG/24HR TD PT24
21.0000 mg | MEDICATED_PATCH | Freq: Every day | TRANSDERMAL | Status: DC
  Administered 2015-06-13 – 2015-06-14 (×2): 21 mg via TRANSDERMAL
  Filled 2015-06-13 (×2): qty 1

## 2015-06-13 MED ORDER — INSULIN ASPART 100 UNIT/ML ~~LOC~~ SOLN
0.0000 [IU] | SUBCUTANEOUS | Status: DC
Start: 2015-06-13 — End: 2015-06-14
  Administered 2015-06-13 (×2): 24 [IU] via SUBCUTANEOUS
  Administered 2015-06-13: 12 [IU] via SUBCUTANEOUS
  Administered 2015-06-14 (×2): 20 [IU] via SUBCUTANEOUS

## 2015-06-13 MED ORDER — TIOTROPIUM BROMIDE MONOHYDRATE 18 MCG IN CAPS
18.0000 ug | ORAL_CAPSULE | Freq: Every day | RESPIRATORY_TRACT | Status: DC
  Filled 2015-06-13 (×2): qty 5

## 2015-06-13 MED ORDER — FUROSEMIDE 40 MG PO TABS
40.0000 mg | ORAL_TABLET | Freq: Two times a day (BID) | ORAL | Status: DC
  Administered 2015-06-13 – 2015-06-14 (×3): 40 mg via ORAL
  Filled 2015-06-13 (×4): qty 1

## 2015-06-13 MED ORDER — HYPROMELLOSE (GONIOSCOPIC) 2.5 % OP SOLN
1.0000 [drp] | Freq: Three times a day (TID) | OPHTHALMIC | Status: DC | PRN
  Administered 2015-06-13 – 2015-06-14 (×2): 1 [drp] via OPHTHALMIC
  Filled 2015-06-13: qty 15

## 2015-06-13 MED ORDER — INSULIN GLARGINE 100 UNIT/ML ~~LOC~~ SOLN
15.0000 [IU] | Freq: Every day | SUBCUTANEOUS | Status: DC
  Administered 2015-06-14: 15 [IU] via SUBCUTANEOUS
  Filled 2015-06-13: qty 0.15

## 2015-06-13 MED ORDER — CARVEDILOL 25 MG PO TABS
25.0000 mg | ORAL_TABLET | Freq: Two times a day (BID) | ORAL | Status: DC
  Administered 2015-06-13 – 2015-06-14 (×3): 25 mg via ORAL
  Filled 2015-06-13 (×3): qty 1

## 2015-06-13 NOTE — Progress Notes (Signed)
CBG 523. MD made aware. Orders recieved

## 2015-06-13 NOTE — Progress Notes (Signed)
Family Medicine Teaching Service Daily Progress Note Intern Pager: [phone]  Patient name: Jerry Cantrell Medical record number: [id number] Date of birth: [birth year] Age: 49 y.o. Gender: male  Primary Care Provider: Helane Rima, MD Consultants: Code Status: Full   Pt Overview and Major Events to Date:  - Resumed Care from [facility] 06/12/2015  Assessment and Plan: Jerry Cantrell is a 49 y.o. male presenting with respiratory failure due CHF exacerbation with initial admission to C[facility] . PMH is significant for HTN, CHF, CKD3, OSA, T2DM, Hx of Cocaine abuse, Tobacco use   Acute respiratory failure likely 2/2 CHF: Patient denies having SOB. He states that he feels ready to go home. Pulse oximetry has been stable on room air. Lungs clear on exam, 2+ pitting edema.This could be precipitated by cocaine use with positive tox screen. Obesity could be a contributing factor as well. No infectious etiology. Blood, urine and sputum cultures neg. (10/8) CXR  right basilar opacity is noted concerning for pneumonia or atelectasis with possible associated pleural effusion. CXR with 10/11 with persistent pulmonary edema. ECHO 10/7 with G1DD, EF 55%, mildly dilated LV & moderate dilation of LA.  Likely exacerbated by cocaine abuse.  No interim change. (10/11). UOP 2L net +0.7L.  - Continue Dunonebs q6h prn + albuterol prn - Continue Spriva - Consider adding Symbicort  - continue BiPAP HS - PO lasix 40 mg BID  - Bmet daily while on lasix - resume his home coreg today.  AKI SrCr 1.9>3.62 >2.34>2.14. Baseline seems to be 2.0 - follow up daily Bmet while on diuretics  Severe OSA: BiPAP at night for severe OSA - Will continue BIPAP  Left Arm Swollen:  Improved. UE doppler with superficial thrombosis.  - Will continue to watch and reassess   HTN: BP 181/90  - Resumed home PO hydralazine, clonidine, verapamil  - Will resume his home coreg today. - IV hydralazine prn SBP >180   HLD  Cont home fenofibtrate,  lipitor  T2DM HA1C 8.9  CBGs 250, 120 (FSBG) -Will continue Lantus 15 units. - SSI, moderate.    Cocaine and Tobacco abuse:  - Consult social work, patient interested in quitting  - continue nicotine patches  FEN/GI: PPI, Carb modified  Prophylaxis: Lovenox   Disposition: Telemetry   Subjective:  Patient denies any SOB this morning. Still complaining of mucosal congestion. States that nebulizer treatments really help him.   Objective: Temp:  [98.1 F (36.7 C)-98.9 F (37.2 C)] 98.9 F (37.2 C) (10/13 2100) Pulse Rate:  [72-83] 72 (10/13 2100) Resp:  [20] 20 (10/13 0508) BP: (165-206)/(91-108) 165/92 mmHg (10/13 2100) SpO2:  [92 %-93 %] 93 % (10/13 2100) Weight:  [285 lb 1.6 oz (129.321 kg)] 285 lb 1.6 oz (129.321 kg) (10/13 [id number]) Physical Exam: Gen: NAD, obese, well-appearing  CV: RRR. S1 & S2 audible, no murmurs, 2+ radial pulses  Resp: no apparent increased WOB, CTAB. Abd: +BS. Soft, NDNT, no rebound or guarding.  Ext: 1+ edema in LE's bilaterally. Neuro: Alert and oriented, No gross focal deficits  Laboratory:  Recent Labs Lab 06/12/15 0327 06/12/15 2309 06/13/15 0935  WBC 7.5 7.6 6.0  HGB 15.4 14.9 14.5  HCT 50.3 48.9 48.6  PLT 140* 152 142*    Recent Labs Lab 06/12/15 0327 06/12/15 2309 06/13/15 0935  NA 147* 140 141  K 3.6 4.2 4.0  CL 100* 97* 98*  CO2 32 29 32  BUN 47* 47* 49*  CREATININE 2.30* 2.42* 2.34*  CALCIUM 8.8* 8.6* 8.8*  GLUCOSE  124* 390* 521*     Imaging/Diagnostic Tests: Dg Chest 2 View  05/15/2015  CLINICAL DATA:  Shortness of Breath EXAM: CHEST  2 VIEW COMPARISON:  May 13, 2015 FINDINGS: There is slightly less consolidation in the right base. There is a persistent right effusion. There is mild atelectasis in the left mid lower lung zones. There is also mild atelectasis in the right mid lung which is stable. No new opacities apparent. Heart is mildly enlarged with pulmonary vascularity within normal limits. No adenopathy.  There is degenerative change in the thoracic spine. IMPRESSION: Slightly less consolidation right base. Right effusion is not felt to be appreciably changed. Patchy atelectasis bilaterally is stable. There is stable cardiomegaly. No new opacity. Electronically Signed   By: Lowella Grip III M.D.   On: 05/15/2015 08:08   Dg Abd 1 View  06/06/2015  CLINICAL DATA:  Orogastric tube placement. EXAM: ABDOMEN - 1 VIEW COMPARISON:  None. FINDINGS: Orogastric tube has been placed, tip overlying the level of the mid stomach. Bowel gas pattern is nonobstructive. Note is made of dense opacity at the lung bases bilaterally. IMPRESSION: Interval placement of orogastric tube. Electronically Signed   By: Nolon Nations M.D.   On: 06/06/2015 16:45   Ct Head Wo Contrast  06/06/2015  CLINICAL DATA:  Altered mental status. EXAM: CT HEAD WITHOUT CONTRAST TECHNIQUE: Contiguous axial images were obtained from the base of the skull through the vertex without intravenous contrast. COMPARISON:  01/18/15. FINDINGS: There is no intracranial hemorrhage, mass or extra-axial fluid collection. No evidence of acute infarction. Mild generalized atrophy is present. Gray matter and white matter are unremarkable, with normal differentiation. No acute findings are evident. Calvarium and skullbase are intact. There are air-fluid levels in the maxillary sinuses bilaterally and also in the left sphenoid sinus. Moderate membrane thickening in the ethmoid air cells and in the right sphenoid sinus. There is fluid in the mastoid air cells bilaterally. IMPRESSION: No acute intracranial findings.  Mild generalized atrophy. Paranasal sinus disease, with air-fluid levels in the maxillary sinuses and left sphenoid. There also are mastoid effusions bilaterally. Electronically Signed   By: Andreas Newport M.D.   On: 06/06/2015 23:22   Ct Chest Wo Contrast  05/15/2015  CLINICAL DATA:  CHF, pleural effusion, hypertension, hyperlipidemia, diabetes  mellitus with nephropathy, acute diastolic heart failure/CHF EXAM: CT CHEST WITHOUT CONTRAST TECHNIQUE: Multidetector CT imaging of the chest was performed following the standard protocol without IV contrast. Sagittal and coronal MPR images reconstructed from axial data set. COMPARISON:  03/05/2010 FINDINGS: Aorta normal caliber. Calcified RIGHT hilar and subcarinal lymph nodes. Additional noncalcified mediastinal nodes largest RIGHT peritracheal 11 mm diameter image 22 and LEFT paratracheal 12 mm diameter. Visualized upper abdomen unremarkable. Heart enlarged. Small to moderate RIGHT pleural effusion. Calcified granulomata RIGHT upper and RIGHT lower lobes. Atelectasis of RIGHT lower lobe. 5 mm slightly ill-defined nodule inferior RIGHT upper lobe image 26 unchanged. Posterior RIGHT upper lobe nodule 4 mm diameter image 16 unchanged. Mild compressive atelectasis at posterior aspects of the RIGHT middle and RIGHT upper lobes. Focal area of slightly nodular scarring antral lateral RIGHT middle lobe base image 40 unchanged. Dependent atelectasis LEFT lower lobe. 5 mm LEFT lower lobe nodule image 37 not definitely seen previously. No acute infiltrate or pneumothorax. Scattered endplate spur formation throughout mid to inferior thoracic spine. IMPRESSION: Small to moderate RIGHT pleural effusion. Old granulomatous disease. Scattered atelectasis greatest in RIGHT lower lobe. BILATERAL pulmonary nodules, some stable, 5 mm nodule LEFT lower  lobe not definitely previously visualized; recommendation below. If the patient is at high risk for bronchogenic carcinoma, follow-up chest CT at 6-12 months is recommended. If the patient is at low risk for bronchogenic carcinoma, follow-up chest CT at 12 months is recommended. This recommendation follows the consensus statement: Guidelines for Management of Small Pulmonary Nodules Detected on CT Scans: A Statement from the [place] as published in Radiology 2005;237:395-400.  Electronically Signed   By: Lavonia Dana M.D.   On: 05/15/2015 13:40   Dg Chest Port 1 View  06/11/2015  CLINICAL DATA:  Shortness of breath. EXAM: PORTABLE CHEST 1 VIEW COMPARISON:  06/10/2015. FINDINGS: Interim extubation and removal of NG tube. Cardiomegaly with persistent bilateral pulmonary infiltrates consistent pulmonary edema. No prompt pleural effusion or pneumothorax. IMPRESSION: 1.  Interim extubation and removal of NG tube. 2. Persistent changes of congestive heart failure pulmonary edema. No interim change. Electronically Signed   By: Marcello Moores  Register   On: 06/11/2015 07:24   Dg Chest Port 1 View  06/10/2015  CLINICAL DATA:  Hypoxia.  Cocaine abuse. EXAM: PORTABLE CHEST 1 VIEW COMPARISON:  June 09, 2015 FINDINGS: Endotracheal tube tip is 3.9 cm above the carina. Nasogastric tube tip and side port are below the diaphragm. No pneumothorax. There is interstitial edema with bilateral effusions, stable. There is airspace consolidation in the left base, stable. No new opacity. Heart is enlarged. Pulmonary vascularity is within normal limits. No adenopathy appreciable. IMPRESSION: Tube positions as described without pneumothorax. Findings felt to represent a degree of congestive heart failure. Superimposed pneumonia in the left base cannot be excluded. The overall appearance is stable compared to 1 day prior. Electronically Signed   By: Lowella Grip III M.D.   On: 06/10/2015 07:11   Dg Chest Port 1 View  06/09/2015  CLINICAL DATA:  Shortness of breath.  Ventilator support. EXAM: PORTABLE CHEST 1 VIEW COMPARISON:  06/08/2015 and multiple previous FINDINGS: Endotracheal tube has its tip 7.5 cm above the carina. Nasogastric tube enters stomach. Bilateral effusions with volume loss/ pneumonia in both lower lobes. Some worsening over the last several films, particularly at the left base. IMPRESSION: Effusions and basilar volume loss left worse than right. Worsening over the last several films.  Electronically Signed   By: Nelson Chimes M.D.   On: 06/09/2015 08:19   Dg Chest Port 1 View  06/08/2015  CLINICAL DATA:  Followup pulmonary edema and ventilator support. EXAM: PORTABLE CHEST 1 VIEW COMPARISON:  06/06/2015 and previous FINDINGS: The tracheal tube has its tip 6 cm above the carina. Nasogastric tube enters the stomach. Effusions persist larger on the right than the left with lower lobe volume loss. There is mild edema, improving over time. IMPRESSION: Slowly improving pulmonary edema. Effusions and lower lobe volume loss right more than left. Electronically Signed   By: Nelson Chimes M.D.   On: 06/08/2015 09:20   Dg Chest Port 1 View  06/06/2015  CLINICAL DATA:  Shortness of Breath for 2 days. History of congestive heart failure EXAM: PORTABLE CHEST 1 VIEW COMPARISON:  Chest radiograph and chest CT May 15, 2015 FINDINGS: There is interstitial edema with cardiomegaly and pulmonary venous hypertension. There is consolidation in the right base with right effusion. There is elevation of the right hemidiaphragm as well. No adenopathy. IMPRESSION: Evidence of congestive heart failure. Suspect superimposed pneumonia right base. Stable elevation right hemidiaphragm. Electronically Signed   By: Lowella Grip III M.D.   On: 06/06/2015 13:57   Dg Chest [facility]  1v Same Day  06/06/2015  CLINICAL DATA:  Encounter for intubation. EXAM: PORTABLE CHEST 1 VIEW COMPARISON:  Same day. FINDINGS: Stable cardiomegaly. No pneumothorax is noted. Endotracheal tube is in grossly good position with distal tip 5 cm above the carina. Orogastric tube is seen entering stomach. Mild central pulmonary vascular congestion is noted. Stable right basilar opacity is noted concerning for pneumonia or atelectasis with possible associated pleural effusion. IMPRESSION: Endotracheal and orogastric tubes in grossly good position. Stable cardiomegaly and central pulmonary vascular congestion. Stable right basilar opacity as  described above. Electronically Signed   By: Marijo Conception, M.D.   On: 06/06/2015 15:57   Dg Abd Portable 1v  06/07/2015  CLINICAL DATA:  Status post OG tube placement. EXAM: PORTABLE ABDOMEN - 1 VIEW COMPARISON:  06/07/2015 FINDINGS: The enteric tube has been advanced and appears to be folded upon itself within the distal aspect of the stomach/proximal duodenum. Nonobstructed bowel gas pattern. Motion artifact limits exam. IMPRESSION: Interval advancement of the enteric tube which appears to be coiled upon itself predominately located within the stomach and proximal duodenum. Electronically Signed   By: Lovey Newcomer M.D.   On: 06/07/2015 17:46   Dg Abd Portable 1v  06/07/2015  CLINICAL DATA:  Orogastric tube placement. EXAM: PORTABLE ABDOMEN - 1 VIEW COMPARISON:  06/06/2015 FINDINGS: Previously-seen enteric catheter has now folded on itself with tip within the expected location of the distal esophagus and side hole at the expected location of the gastroesophageal junction. The bowel gas pattern is normal. No radio-opaque calculi or other significant radiographic abnormality are seen. IMPRESSION: Enteric catheter folded on itself with tip within the expected location of the distal esophagus. Readjustment is suggested. These results will be called to the ordering clinician or representative by the Radiologist Assistant, and communication documented in the PACS or zVision Dashboard. Electronically Signed   By: Fidela Salisbury M.D.   On: 06/07/2015 16:33   Dg Abd Portable 1v  06/06/2015  CLINICAL DATA:  Status post OG tube placement EXAM: PORTABLE ABDOMEN - 1 VIEW COMPARISON:  06/06/2015 FINDINGS: Orogastric catheter is again identified within the stomach. The overall position is unchanged. No acute obstructive changes are noted. IMPRESSION: Orogastric catheter within the stomach. Electronically Signed   By: Inez Catalina M.D.   On: 06/06/2015 19:57     Cletis Clack Cletis Media, MD 06/13/2015, 10:15  PM PGY-1, [place] Intern pager: [phone], text pages welcome

## 2015-06-13 NOTE — Progress Notes (Addendum)
Physical Therapy Treatment Patient Details Name: Jerry Cantrell MRN: [id number] DOB: Jul 02, 1966 Today's Date: 06/13/2015    History of Present Illness 49yo male with hx HTN, dCHF, OSA, cocaine abuse recent d/c 9/17 after admission for CHF exacerbation returns 10/6 with increased SOB. Initially tried on bipap in ER but failed requiring intubation. Extubated 10/09.    PT Comments    Patient received EOB on room air, saturations 90%, attempted ambulation with saturations dropping to 83% and HR elevating despite multiple rest breaks and cues for pursed lip breathing. Upon return to room, replaced oxygen 2 liters and took >2 minutes to rebound to 92%. Educated patient regarding use of oxygen, patient states he is trying to "wean himself off of the oxygen". At this time, very apparent need of supplemental O2 with activity. Nsg notified. Will continue to see and progress as tolerated.  Follow Up Recommendations  Home health PT;Supervision for mobility/OOB     Equipment Recommendations  3in1 (PT)    Recommendations for Other Services OT consult     Precautions / Restrictions Precautions Precautions: Fall    Mobility  Bed Mobility               General bed mobility comments: sitting EOB upon entering  Transfers Overall transfer level: Needs assistance Equipment used: Rolling walker (2 wheeled)   Sit to Stand: Supervision         General transfer comment: VCs for safety  Ambulation/Gait Ambulation/Gait assistance: Supervision Ambulation Distance (Feet): 160 Feet Assistive device: Rolling walker (2 wheeled) Gait Pattern/deviations: Step-through pattern;Decreased stride length;Trunk flexed   Gait velocity interpretation: Below normal speed for age/gender General Gait Details: ambulated with 4 standing rest breaks and cues for pursed lip breathing. Attempted to ambulated on room air, saturations remained low 80s throughout with HR elevating to 110s.    Stairs             Wheelchair Mobility    Modified Rankin (Stroke Patients Only)       Balance Overall balance assessment: Needs assistance   Sitting balance-Leahy Scale: Fair       Standing balance-Leahy Scale: Poor                      Cognition Arousal/Alertness: Awake/alert Behavior During Therapy: WFL for tasks assessed/performed;Impulsive Overall Cognitive Status: History of cognitive impairments - at baseline                      Exercises      General Comments        Pertinent Vitals/Pain      Home Living                      Prior Function            PT Goals (current goals can now be found in the care plan section) Acute Rehab PT Goals Patient Stated Goal: be able to return home PT Goal Formulation: With patient Time For Goal Achievement: 06/25/15 Potential to Achieve Goals: Good Progress towards PT goals: Progressing toward goals    Frequency  Min 3X/week    PT Plan Current plan remains appropriate    Co-evaluation             End of Session Equipment Utilized During Treatment: Gait belt;Oxygen Activity Tolerance: Patient tolerated treatment well Patient left: in bed;with call bell/phone within reach (sitting EOB)     Time: [id number] PT Time Calculation (min) (ACUTE  ONLY): 19 min  Charges:  $Gait Training: 8-22 mins                    G CodesDuncan Dull 07/11/15, 1:46 PM Alben Deeds, [place] DPT  [phone]

## 2015-06-13 NOTE — Progress Notes (Signed)
Family Medicine Teaching Service Daily Progress Note Intern Pager: [phone]  Patient name: Jerry Cantrell Medical record number: [id number] Date of birth: 1966/05/20 Age: 49 y.o. Gender: male  Primary Care Provider: Helane Rima, MD Consultants: Code Status: Full   Pt Overview and Major Events to Date:  - Resumed Care from [facility] 06/12/2015  Assessment and Plan: Jerry Cantrell is a 49 y.o. male presenting with respiratory failure due CHF exacerbation with initial admission to [facility] . PMH is significant for HTN, CHF, CKD3, OSA, T2DM, Hx of Cocaine abuse, Tobacco use   Acute respiratory failure likely 2/2 CHF: improved. This could be precipitated by cocaine use with positive tox screen. Obesity could be a contributing factor as well. No infectious etiology. Blood, urine and sputum cultures neg. (10/8) CXR  right basilar opacity is noted concerning for pneumonia or atelectasis with possible associated pleural effusion. CXR with 10/11 with persistent pulmonary edema. ECHO 10/7 with G1DD, EF 55%, mildly dilated LV & moderate dilation of LA.  Likely exacerbated by cocaine abuse.  No interim change. (10/11). UOP 2L net +0.7L. Wt 15LBs down from admission but 2Lbs up since yesterday. Also hx of OSA and on BIPAP with 5L oxygen over night. Walking in the room without shortness of breath this morning. No crackles on lung exam. - Continue Dunonebs q6h + albuterol prn - Started Spiriva - continue BiPAP HS - PO lasix 40 mg BID - Bmet daily while on lasix - resume his home coreg today.  AKI SrCr 1.9>>>3.62 >>2.34. 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 UE doppler with superficial thrombosis.  - Will get  U/S Extremity left upper - Will continue to watch and reassess   HTN: BP 169/86   - Resumed home PO hydralazine, clonidine,  verapamil  - Will resume his home coreg today. - IV hydralazine prn SBP >180   HLD  Cont home fenofibtrate, lipitor  T2DM HA1C 8.9  CBGs in lower to upper 300's  -Will increase his Lantus  From 10 to 15 units. - SSI, moderate.    Cocaine and Tobacco abuse:  - Consult social work, patient interested in quitting  - continue nicotine patches  FEN/GI: PPI, Carb modified  Prophylaxis: Lovenox   Disposition: Telemetry   Subjective:  Patient continues denies shortness of breath. Up walking in the room without oxygen this morning. Says he doesn't have different pain in his arm. He has neuropathy at baseline. Sleeps propped up. Uses three pillows at baseline. Used his BiPAP. Denies chest pain.  Objective: Temp:  [98.1 F (36.7 C)-98.8 F (37.1 C)] 98.1 F (36.7 C) (10/13 0508) Pulse Rate:  [78-88] 78 (10/13 0508) Resp:  [20-22] 20 (10/13 0508) BP: (163-185)/(81-105) 170/91 mmHg (10/13 0508) SpO2:  [91 %-97 %] 92 % (10/13 0723) Weight:  [285 lb 1.6 oz (129.321 kg)] 285 lb 1.6 oz (129.321 kg) (10/13 [id number]) Physical Exam: Gen: NAD, obese, well-appearing  CV: RRR. S1 & S2 audible, no murmurs, 1+ edema in LE's bilaterally Resp: no apparent WOB, CTAB. Abd: +BS. Soft, NDNT, no rebound or guarding.  Ext: 1+ edema in LE's bilaterally. Bullae on his right antecubital area where IV line came out from. Right arm slightly swollen, not tender, superficial wound of about an inch in diameter at previous IV line over his left antecubital area. Neuro: Alert and  oriented, No gross focal deficits  Laboratory:  Recent Labs Lab 06/11/15 0239 06/12/15 0327 06/12/15 2309  WBC 8.3 7.5 7.6  HGB 16.2 15.4 14.9  HCT 53.3* 50.3 48.9  PLT 137* 140* 152    Recent Labs Lab 06/06/15 1413  06/11/15 0239 06/12/15 0327 06/12/15 2309  NA 139  < > 150* 147* 140  K 4.3  < > 3.8 3.6 4.2  CL 101  < > 106 100* 97*  CO2 27  < > 33* 32 29  BUN 25*  < > 52* 47* 47*  CREATININE 2.12*  < > 2.50* 2.30*  2.42*  CALCIUM 8.7*  < > 8.7* 8.8* 8.6*  PROT 5.6*  --   --   --   --   BILITOT 0.2*  --   --   --   --   ALKPHOS 84  --   --   --   --   ALT 12*  --   --   --   --   AST 16  --   --   --   --   GLUCOSE 286*  < > 170* 124* 390*  < > = values in this interval not displayed.   Imaging/Diagnostic Tests: Dg Chest 2 View  05/15/2015  CLINICAL DATA:  Shortness of Breath EXAM: CHEST  2 VIEW COMPARISON:  May 13, 2015 FINDINGS: There is slightly less consolidation in the right base. There is a persistent right effusion. There is mild atelectasis in the left mid lower lung zones. There is also mild atelectasis in the right mid lung which is stable. No new opacities apparent. Heart is mildly enlarged with pulmonary vascularity within normal limits. No adenopathy. There is degenerative change in the thoracic spine. IMPRESSION: Slightly less consolidation right base. Right effusion is not felt to be appreciably changed. Patchy atelectasis bilaterally is stable. There is stable cardiomegaly. No new opacity. Electronically Signed   By: Lowella Grip III M.D.   On: 05/15/2015 08:08   Dg Abd 1 View  06/06/2015  CLINICAL DATA:  Orogastric tube placement. EXAM: ABDOMEN - 1 VIEW COMPARISON:  None. FINDINGS: Orogastric tube has been placed, tip overlying the level of the mid stomach. Bowel gas pattern is nonobstructive. Note is made of dense opacity at the lung bases bilaterally. IMPRESSION: Interval placement of orogastric tube. Electronically Signed   By: Nolon Nations M.D.   On: 06/06/2015 16:45   Ct Head Wo Contrast  06/06/2015  CLINICAL DATA:  Altered mental status. EXAM: CT HEAD WITHOUT CONTRAST TECHNIQUE: Contiguous axial images were obtained from the base of the skull through the vertex without intravenous contrast. COMPARISON:  01/18/15. FINDINGS: There is no intracranial hemorrhage, mass or extra-axial fluid collection. No evidence of acute infarction. Mild generalized atrophy is present. Gray  matter and white matter are unremarkable, with normal differentiation. No acute findings are evident. Calvarium and skullbase are intact. There are air-fluid levels in the maxillary sinuses bilaterally and also in the left sphenoid sinus. Moderate membrane thickening in the ethmoid air cells and in the right sphenoid sinus. There is fluid in the mastoid air cells bilaterally. IMPRESSION: No acute intracranial findings.  Mild generalized atrophy. Paranasal sinus disease, with air-fluid levels in the maxillary sinuses and left sphenoid. There also are mastoid effusions bilaterally. Electronically Signed   By: Andreas Newport M.D.   On: 06/06/2015 23:22   Ct Chest Wo Contrast  05/15/2015  CLINICAL DATA:  CHF, pleural effusion, hypertension, hyperlipidemia, diabetes mellitus with  nephropathy, acute diastolic heart failure/CHF EXAM: CT CHEST WITHOUT CONTRAST TECHNIQUE: Multidetector CT imaging of the chest was performed following the standard protocol without IV contrast. Sagittal and coronal MPR images reconstructed from axial data set. COMPARISON:  03/05/2010 FINDINGS: Aorta normal caliber. Calcified RIGHT hilar and subcarinal lymph nodes. Additional noncalcified mediastinal nodes largest RIGHT peritracheal 11 mm diameter image 22 and LEFT paratracheal 12 mm diameter. Visualized upper abdomen unremarkable. Heart enlarged. Small to moderate RIGHT pleural effusion. Calcified granulomata RIGHT upper and RIGHT lower lobes. Atelectasis of RIGHT lower lobe. 5 mm slightly ill-defined nodule inferior RIGHT upper lobe image 26 unchanged. Posterior RIGHT upper lobe nodule 4 mm diameter image 16 unchanged. Mild compressive atelectasis at posterior aspects of the RIGHT middle and RIGHT upper lobes. Focal area of slightly nodular scarring antral lateral RIGHT middle lobe base image 40 unchanged. Dependent atelectasis LEFT lower lobe. 5 mm LEFT lower lobe nodule image 37 not definitely seen previously. No acute infiltrate or  pneumothorax. Scattered endplate spur formation throughout mid to inferior thoracic spine. IMPRESSION: Small to moderate RIGHT pleural effusion. Old granulomatous disease. Scattered atelectasis greatest in RIGHT lower lobe. BILATERAL pulmonary nodules, some stable, 5 mm nodule LEFT lower lobe not definitely previously visualized; recommendation below. If the patient is at high risk for bronchogenic carcinoma, follow-up chest CT at 6-12 months is recommended. If the patient is at low risk for bronchogenic carcinoma, follow-up chest CT at 12 months is recommended. This recommendation follows the consensus statement: Guidelines for Management of Small Pulmonary Nodules Detected on CT Scans: A Statement from the [employer] as published in Radiology 2005;237:395-400. Electronically Signed   By: Lavonia Dana M.D.   On: 05/15/2015 13:40   Dg Chest Port 1 View  06/11/2015  CLINICAL DATA:  Shortness of breath. EXAM: PORTABLE CHEST 1 VIEW COMPARISON:  06/10/2015. FINDINGS: Interim extubation and removal of NG tube. Cardiomegaly with persistent bilateral pulmonary infiltrates consistent pulmonary edema. No prompt pleural effusion or pneumothorax. IMPRESSION: 1.  Interim extubation and removal of NG tube. 2. Persistent changes of congestive heart failure pulmonary edema. No interim change. Electronically Signed   By: Marcello Moores  Register   On: 06/11/2015 07:24   Dg Chest Port 1 View  06/10/2015  CLINICAL DATA:  Hypoxia.  Cocaine abuse. EXAM: PORTABLE CHEST 1 VIEW COMPARISON:  June 09, 2015 FINDINGS: Endotracheal tube tip is 3.9 cm above the carina. Nasogastric tube tip and side port are below the diaphragm. No pneumothorax. There is interstitial edema with bilateral effusions, stable. There is airspace consolidation in the left base, stable. No new opacity. Heart is enlarged. Pulmonary vascularity is within normal limits. No adenopathy appreciable. IMPRESSION: Tube positions as described without pneumothorax.  Findings felt to represent a degree of congestive heart failure. Superimposed pneumonia in the left base cannot be excluded. The overall appearance is stable compared to 1 day prior. Electronically Signed   By: Lowella Grip III M.D.   On: 06/10/2015 07:11   Dg Chest Port 1 View  06/09/2015  CLINICAL DATA:  Shortness of breath.  Ventilator support. EXAM: PORTABLE CHEST 1 VIEW COMPARISON:  06/08/2015 and multiple previous FINDINGS: Endotracheal tube has its tip 7.5 cm above the carina. Nasogastric tube enters stomach. Bilateral effusions with volume loss/ pneumonia in both lower lobes. Some worsening over the last several films, particularly at the left base. IMPRESSION: Effusions and basilar volume loss left worse than right. Worsening over the last several films. Electronically Signed   By: Nelson Chimes M.D.   On:  06/09/2015 08:19   Dg Chest Port 1 View  06/08/2015  CLINICAL DATA:  Followup pulmonary edema and ventilator support. EXAM: PORTABLE CHEST 1 VIEW COMPARISON:  06/06/2015 and previous FINDINGS: The tracheal tube has its tip 6 cm above the carina. Nasogastric tube enters the stomach. Effusions persist larger on the right than the left with lower lobe volume loss. There is mild edema, improving over time. IMPRESSION: Slowly improving pulmonary edema. Effusions and lower lobe volume loss right more than left. Electronically Signed   By: Nelson Chimes M.D.   On: 06/08/2015 09:20   Dg Chest Port 1 View  06/06/2015  CLINICAL DATA:  Shortness of Breath for 2 days. History of congestive heart failure EXAM: PORTABLE CHEST 1 VIEW COMPARISON:  Chest radiograph and chest CT May 15, 2015 FINDINGS: There is interstitial edema with cardiomegaly and pulmonary venous hypertension. There is consolidation in the right base with right effusion. There is elevation of the right hemidiaphragm as well. No adenopathy. IMPRESSION: Evidence of congestive heart failure. Suspect superimposed pneumonia right base.  Stable elevation right hemidiaphragm. Electronically Signed   By: Lowella Grip III M.D.   On: 06/06/2015 13:57   Dg Chest Port 1v Same Day  06/06/2015  CLINICAL DATA:  Encounter for intubation. EXAM: PORTABLE CHEST 1 VIEW COMPARISON:  Same day. FINDINGS: Stable cardiomegaly. No pneumothorax is noted. Endotracheal tube is in grossly good position with distal tip 5 cm above the carina. Orogastric tube is seen entering stomach. Mild central pulmonary vascular congestion is noted. Stable right basilar opacity is noted concerning for pneumonia or atelectasis with possible associated pleural effusion. IMPRESSION: Endotracheal and orogastric tubes in grossly good position. Stable cardiomegaly and central pulmonary vascular congestion. Stable right basilar opacity as described above. Electronically Signed   By: Marijo Conception, M.D.   On: 06/06/2015 15:57   Dg Abd Portable 1v  06/07/2015  CLINICAL DATA:  Status post OG tube placement. EXAM: PORTABLE ABDOMEN - 1 VIEW COMPARISON:  06/07/2015 FINDINGS: The enteric tube has been advanced and appears to be folded upon itself within the distal aspect of the stomach/proximal duodenum. Nonobstructed bowel gas pattern. Motion artifact limits exam. IMPRESSION: Interval advancement of the enteric tube which appears to be coiled upon itself predominately located within the stomach and proximal duodenum. Electronically Signed   By: Lovey Newcomer M.D.   On: 06/07/2015 17:46   Dg Abd Portable 1v  06/07/2015  CLINICAL DATA:  Orogastric tube placement. EXAM: PORTABLE ABDOMEN - 1 VIEW COMPARISON:  06/06/2015 FINDINGS: Previously-seen enteric catheter has now folded on itself with tip within the expected location of the distal esophagus and side hole at the expected location of the gastroesophageal junction. The bowel gas pattern is normal. No radio-opaque calculi or other significant radiographic abnormality are seen. IMPRESSION: Enteric catheter folded on itself with tip within  the expected location of the distal esophagus. Readjustment is suggested. These results will be called to the ordering clinician or representative by the Radiologist Assistant, and communication documented in the PACS or zVision Dashboard. Electronically Signed   By: Fidela Salisbury M.D.   On: 06/07/2015 16:33   Dg Abd Portable 1v  06/06/2015  CLINICAL DATA:  Status post OG tube placement EXAM: PORTABLE ABDOMEN - 1 VIEW COMPARISON:  06/06/2015 FINDINGS: Orogastric catheter is again identified within the stomach. The overall position is unchanged. No acute obstructive changes are noted. IMPRESSION: Orogastric catheter within the stomach. Electronically Signed   By: Inez Catalina M.D.   On: 06/06/2015  19:57     Mercy Riding, MD 06/13/2015, 9:26 AM PGY-1, [place] Intern pager: [phone], text pages welcome

## 2015-06-13 NOTE — Progress Notes (Signed)
Inpatient Diabetes Program Recommendations  AACE/ADA: New Consensus Statement on Inpatient Glycemic Control (2015)  Target Ranges:  Prepandial:   less than 140 mg/dL      Peak postprandial:   less than 180 mg/dL (1-2 hours)      Critically ill patients:  140 - 180 mg/dL   Review of Glycemic Control  Inpatient Diabetes Program Recommendations:  Insulin - Basal: Increase Lantus to 25 units Correction (SSI): change Novolog correction to TID (to coincide with meal coverage TID) Insulin - Meal Coverage: Increase Novolog with meals to 6 units TID Thank you  Raoul Pitch BSN, RN,CDE Inpatient Diabetes Coordinator [phone] (team pager)

## 2015-06-14 LAB — BASIC METABOLIC PANEL
ANION GAP: 9 (ref 5–15)
BUN: 48 mg/dL — ABNORMAL HIGH (ref 6–20)
CALCIUM: 8.5 mg/dL — AB (ref 8.9–10.3)
CO2: 33 mmol/L — ABNORMAL HIGH (ref 22–32)
Chloride: 98 mmol/L — ABNORMAL LOW (ref 101–111)
Creatinine, Ser: 2.14 mg/dL — ABNORMAL HIGH (ref 0.61–1.24)
GFR, EST AFRICAN AMERICAN: 40 mL/min — AB (ref >60)
GFR, EST NON AFRICAN AMERICAN: 35 mL/min — AB (ref >60)
Glucose, Bld: 79 mg/dL (ref 65–99)
Potassium: 3.6 mmol/L (ref 3.5–5.1)
SODIUM: 140 mmol/L (ref 135–145)

## 2015-06-14 LAB — GLUCOSE, CAPILLARY
GLUCOSE-CAPILLARY: 120 mg/dL — AB (ref 65–99)
GLUCOSE-CAPILLARY: 308 mg/dL — AB (ref 65–99)
Glucose-Capillary: 406 mg/dL — ABNORMAL HIGH (ref 65–99)
Glucose-Capillary: 394 mg/dL — ABNORMAL HIGH (ref 65–99)

## 2015-06-14 MED ORDER — INSULIN GLARGINE 100 UNIT/ML ~~LOC~~ SOLN: 35.0000 [IU] | Freq: Every day | SUBCUTANEOUS | Status: DC

## 2015-06-14 MED ORDER — IPRATROPIUM-ALBUTEROL 0.5-2.5 (3) MG/3ML IN SOLN: 3.0000 mL | Freq: Four times a day (QID) | RESPIRATORY_TRACT | Status: DC | PRN

## 2015-06-14 MED ORDER — INSULIN GLARGINE 100 UNIT/ML ~~LOC~~ SOLN: 30.0000 [IU] | Freq: Every day | SUBCUTANEOUS | Status: DC

## 2015-06-14 MED ORDER — INSULIN GLARGINE 100 UNIT/ML ~~LOC~~ SOLN
15.0000 [IU] | Freq: Once | SUBCUTANEOUS | Status: AC
  Administered 2015-06-14: 15 [IU] via SUBCUTANEOUS
  Filled 2015-06-14: qty 0.15

## 2015-06-14 NOTE — Progress Notes (Signed)
Home oxygen ordered as requested. Mindi Slicker [facility] [phone]

## 2015-06-14 NOTE — Progress Notes (Signed)
Per nurse, patient desating to 82% this morning, was put back on 4 Liters of oxygen. Will continue to evaluate. Consider getting CXR if continues to have trending up oxygenation requirements. Also patient's lasix was reduced yesterday, may need to consider increasing this depending on patient's physical appearance.

## 2015-06-14 NOTE — Progress Notes (Signed)
FPTS Interim Progress Note S: Called a patient in his room about 17:30 pm to let him know about a plan to keep him overnight for his elevated CBG (400). He hang up the phone. Attempted to call him back. He wasn't picking his phone. Few minutes later, I received a page from his nurse that patient is leaving AMA. I went up to talk to him. I met a patient by an elevator. He understands that his blood sugar is high. However, he said this is all our fault for not giving him the right amount of insulin. Then, he walked in to an elevator when the elevator door opened.  O: BP 171/107 mmHg  Pulse 71  Temp(Src) 98.1 F (36.7 C) (Oral)  Resp 18  Ht _0  (1.854 m)  Wt 284 lb 4.8 oz (128.958 kg)  BMI 37.52 kg/m2  SpO2 100%   Mercy Riding, MD 06/14/2015, 6:02 PM PGY-1, [place] Service pager [phone]

## 2015-06-14 NOTE — Progress Notes (Signed)
Physical Therapy Treatment Patient Details Name: Jerry Cantrell MRN: [id number] DOB: [birth year] Today's Date: Jun 29, 2015    History of Present Illness 49yo male with hx HTN, dCHF, OSA, cocaine abuse recent d/c 9/17 after admission for CHF exacerbation returns 10/6 with increased SOB. Initially tried on bipap in ER but failed requiring intubation. Extubated 10/09.    PT Comments    Patient received with no O2 on. Saturations at rest 86%. ambulated on room air initially, saturations dropping to 83% provided 2 liters [place] saturations improved to 91 %, 3 liters imprved to 94%. Patient very frustrated regarding need for O2, questionable compliance.  Follow Up Recommendations  Home health PT;Supervision for mobility/OOB     Equipment Recommendations  3in1 (PT)    Recommendations for Other Services OT consult     Precautions / Restrictions Precautions Precautions: Fall    Mobility  Bed Mobility               General bed mobility comments: sitting EOB upon entering  Transfers Overall transfer level: Needs assistance Equipment used: Rolling walker (2 wheeled) Transfers: Sit to/from Stand Sit to Stand: Supervision         General transfer comment: VCs for safety  Ambulation/Gait Ambulation/Gait assistance: Supervision Ambulation Distance (Feet): 240 Feet Assistive device: Rolling walker (2 wheeled) Gait Pattern/deviations: Step-through pattern;Decreased stride length;Trunk flexed     General Gait Details: ambulated on room air initially, saturations dropping to 83% provided 2 liters [place] saturations improved to 91 %, 3 liters imprved to 94%   Stairs            Wheelchair Mobility    Modified Rankin (Stroke Patients Only)       Balance     Sitting balance-Leahy Scale: Fair       Standing balance-Leahy Scale: Poor                      Cognition Arousal/Alertness: Awake/alert Behavior During Therapy: WFL for tasks  assessed/performed;Impulsive Overall Cognitive Status: History of cognitive impairments - at baseline                      Exercises      General Comments        Pertinent Vitals/Pain      Home Living                      Prior Function            PT Goals (current goals can now be found in the care plan section) Acute Rehab PT Goals Patient Stated Goal: be able to return home PT Goal Formulation: With patient Time For Goal Achievement: 06/25/15 Potential to Achieve Goals: Good Progress towards PT goals: Progressing toward goals    Frequency  Min 3X/week    PT Plan Current plan remains appropriate    Co-evaluation             End of Session Equipment Utilized During Treatment: Gait belt;Oxygen Activity Tolerance: Patient tolerated treatment well Patient left: in bed;with call bell/phone within reach (sitting EOB)     Time: [id number] PT Time Calculation (min) (ACUTE ONLY): 13 min  Charges:  $Gait Training: 8-22 mins                    G CodesDuncan Dull 06/29/15, 1:23 PM Alben Deeds, [place] DPT  [phone]

## 2015-06-14 NOTE — Progress Notes (Signed)
Pt frustrated after being communicated need to stay overnight secondary to elevated CBG and BP. Packed belonging, would not return to his room. Sat down in the waiting area to D/C his PIV. Explained and given AMA form if adamant to leaved. Form signed. Dr. Cyndia Skeeters paged and notified. Came to the unit and spoke to Patient. Upset, left unit leaving  home oxygen thank delivered per [facility] in hallway. This writer went on finding him and brought back to floor to apply O2 and reinforce its use. Transferred off floor via wheelchair, accompanied by family member male in to transport home.

## 2015-06-14 NOTE — Progress Notes (Addendum)
SATURATION QUALIFICATIONS: (This note is used to comply with regulatory documentation for home oxygen)  Patient Saturations on Room Air at Rest = 84%  Patient Saturations on Room Air while Ambulating = 83%  Patient Saturations on 3 Liters of oxygen while Ambulating = 90%  Please briefly explain why patient needs home oxygen: C/O SOB after ambulating 60 feet on room air.

## 2015-06-15 ENCOUNTER — Other Ambulatory Visit: Payer: Self-pay | Admitting: Internal Medicine

## 2015-06-15 NOTE — Discharge Summary (Signed)
[facility] Discharge Summary  Patient name: Jerry Cantrell Medical record number: [id number] Date of birth: March 16, 1966 Age: 49 y.o. Gender: male Date of Admission: 06/06/2015  Date of Discharge: 06/14/2015 Admitting Physician: Leeanne Rio, MD  Primary Care Provider: Helane Rima, MD Consultants: CCM   Indication for Hospitalization: Acute Respiratory Failure   Discharge Diagnoses/Problem List:  AKI  Severe OSA  Superficial thrombosis  HTN  HLD  T2DM  Cocaine and tobacco abuse   Disposition: Home against medical advise   Discharge Condition: AMA   Discharge Exam: Gen: NAD, obese, well-appearing  CV: RRR. S1 & S2 audible, no murmurs, 2+ radial pulses  Resp: no apparent increased WOB, CTAB. Abd: +BS. Soft, NDNT, no rebound or guarding.  Ext: 1+ edema in LE's bilaterally. Neuro: Alert and oriented, No gross focal deficits  Brief Hospital Course:  Patient was admitted with acute respiratory failure likely due to CHF exacerbation and cocaine use. Patient's ABG had a pCO2 63.4 and pO2 of 63 with a respiratory acidosis, and  was emergently intubated in the ED. Patient was also admitted with HTN urgency , highest BP 244/152, and received Cardizem drip to lower blood pressures.  Patient  received care by the Critical care team from 10/6 to 10/12. Patient chest xray was significant for extensive pulmonary edema and patient was diruesised with IV lasix. Other possible sources including infectious and neurologic pathology were explored, with negative results.  Patient was eventually extubated on 10/10 to BiPAP and eventually was weaned to nasal cannula and with continued diuresis. Patient care was transferred to family medicine service on 10/12. Patient had desaturations in the 80s off nasal cannula, and was requiring 4 Liters of oxygen on 10/14  when patient decided to leave against medical advises. Patient was also receiving diuresis and titration of  blood glucose for his diabetes when patient left against medical advise.   Issues for Follow Up:  1. Patient will need to make an appointment with pulmonologist to get pulmonary function tests as patient is a long time smoker without any diagnosis of COPD.  2. Patient left AMA, and did not get any of the required medications  3. Patient had an oxygen requirement during hospital stay, please recheck pulse oximetry at hospital follow up  4.  Ensure patient is getting oxygen as they were desaturating into the low 80s off on oxygen during hospitalization, not sure if patient has home oxygen as left AMA   4. Patient blood glucose levels were elevated will need to titrate insulin, patient left AMA before being approriately adjusted  5. Would consider CXR, if patient continues to have oxygen requirements  6. Would titrate lasix as needed per fluid overload status  7. Would recheck that patient is using a spacer with albuterol MDM inhaler 8. Ensure patient is using home CPAP as patient has severe OSA.   Significant Procedures:  ECHO Left ventricle: The cavity size was normal. Wall thickness was increased in a pattern of mild LVH. Doppler parameters are consistent with abnormal left ventricular relaxation (grade 1 diastolic dysfunction). - Pericardium, extracardiac: A trivial pericardial effusion was identified.   Significant Labs and Imaging:   Recent Labs Lab 06/12/15 0327 06/12/15 2309 06/13/15 0935  WBC 7.5 7.6 6.0  HGB 15.4 14.9 14.5  HCT 50.3 48.9 48.6  PLT 140* 152 142*    Recent Labs Lab 06/09/15 0311  06/11/15 0239 06/12/15 0327 06/12/15 2309 06/13/15 0935 06/14/15 0409  NA 143  < > 150* 147*  140 141 140  K 3.5  < > 3.8 3.6 4.2 4.0 3.6  CL 101  < > 106 100* 97* 98* 98*  CO2 30  < > 33* 32 29 32 33*  GLUCOSE 214*  < > 170* 124* 390* 521* 79  BUN 65*  < > 52* 47* 47* 49* 48*  CREATININE 3.07*  < > 2.50* 2.30* 2.42* 2.34* 2.14*  CALCIUM 8.3*  < > 8.7* 8.8*  8.6* 8.8* 8.5*  MG 1.9  --   --   --   --   --   --   PHOS 4.9*  --   --   --   --   --   --   < > = values in this interval not displayed.   Results/Tests Pending at Time of Discharge: None   Discharge Medications:    Medication List    ASK your doctor about these medications        albuterol 108 (90 BASE) MCG/ACT inhaler  Commonly known as:  PROVENTIL HFA;VENTOLIN HFA  Inhale 2 puffs into the lungs every 4 (four) hours as needed for wheezing or shortness of breath.     amitriptyline 50 MG tablet  Commonly known as:  ELAVIL  Take 1 tablet (50 mg total) by mouth at bedtime.     amitriptyline 50 MG tablet  Commonly known as:  ELAVIL  Take 50 mg by mouth.     aspirin 325 MG tablet  Take 1 tablet (325 mg total) by mouth daily.     atorvastatin 80 MG tablet  Commonly known as:  LIPITOR  Take 1 tablet (80 mg total) by mouth daily.     carvedilol 25 MG tablet  Commonly known as:  COREG  Take 1 tablet (25 mg total) by mouth 2 (two) times daily with a meal.     cloNIDine 0.2 MG tablet  Commonly known as:  CATAPRES  Take 1 tablet (0.2 mg total) by mouth 3 (three) times daily.     fenofibrate 160 MG tablet  Take 1 tablet (160 mg total) by mouth daily.     fluticasone 50 MCG/ACT nasal spray  Commonly known as:  FLONASE  Place 1-2 sprays into both nostrils 2 (two) times daily.     furosemide 80 MG tablet  Commonly known as:  LASIX  Take 1 tablet (80 mg total) by mouth daily as needed for edema.     gabapentin 300 MG capsule  Commonly known as:  NEURONTIN  Take 1 capsule (300 mg total) by mouth 3 (three) times daily. 200 mg in the morning and 100 mg in the evening.     glucose blood test strip  Use as instructed     hydrALAZINE 100 MG tablet  Commonly known as:  APRESOLINE  Take 1 tablet (100 mg total) by mouth 3 (three) times daily.     insulin aspart 100 UNIT/ML injection  Commonly known as:  novoLOG  Inject 10-30 Units into the skin 3 (three) times daily before  meals. Per sliding scale     insulin glargine 100 UNIT/ML injection  Commonly known as:  LANTUS  Inject 0.4 mLs (40 Units total) into the skin 2 (two) times daily.     LOVAZA 1 G capsule  Generic drug:  omega-3 acid ethyl esters  Take 2 g by mouth 2 (two) times daily.     nicotine 21 mg/24hr patch  Commonly known as:  NICODERM CQ - dosed in mg/24 hours  Place 1 patch (21 mg total) onto the skin daily.     nicotine 14 mg/24hr patch  Commonly known as:  NICODERM CQ  Place 1 patch (14 mg total) onto the skin daily.     Potassium Chloride ER 20 MEQ Tbcr  take 2 tabs (66mEq) for 6 days, then 1 tab (5mEq) daily     REFRESH OP  Place 2 drops into both eyes 3 (three) times daily as needed (dry eyes).     verapamil 120 MG CR tablet  Commonly known as:  CALAN-SR  Take 1 tablet (120 mg total) by mouth at bedtime.        Discharge Instructions: Please refer to Patient Instructions section of EMR for full details.  Patient was counseled important signs and symptoms that should prompt return to medical care, changes in medications, dietary instructions, activity restrictions, and follow up appointments.   Follow-Up Appointments: Follow-up Information    Follow up with [facility].   Specialty:  [place]   Why:  they will do your home health care at your home   Contact information:   [place] [address] [phone]       Johniece Hornbaker Cletis Media, MD 06/15/2015, 11:03 PM PGY-1, [place]

## 2015-06-18 ENCOUNTER — Ambulatory Visit (INDEPENDENT_AMBULATORY_CARE_PROVIDER_SITE_OTHER): Payer: Medicaid Other | Admitting: Adult Health

## 2015-06-18 ENCOUNTER — Encounter: Payer: Self-pay | Admitting: Adult Health

## 2015-06-18 ENCOUNTER — Ambulatory Visit: Payer: Self-pay | Admitting: Pulmonary Disease

## 2015-06-18 ENCOUNTER — Ambulatory Visit (INDEPENDENT_AMBULATORY_CARE_PROVIDER_SITE_OTHER)
Admission: RE | Admit: 2015-06-18 | Discharge: 2015-06-18 | Disposition: A | Discharge status: Home / Self Care | Payer: Medicaid Other | Source: Ambulatory Visit | Attending: Adult Health | Admitting: Adult Health

## 2015-06-18 VITALS — BP 148/88 | HR 91 | Temp 99.3°F | Ht 73.0 in | Wt 280.0 lb

## 2015-06-18 DIAGNOSIS — G4733 Obstructive sleep apnea (adult) (pediatric): Secondary | ICD-10-CM | POA: Diagnosis not present

## 2015-06-18 DIAGNOSIS — J9611 Chronic respiratory failure with hypoxia: Secondary | ICD-10-CM | POA: Diagnosis not present

## 2015-06-18 DIAGNOSIS — R059 Cough, unspecified: Secondary | ICD-10-CM

## 2015-06-18 DIAGNOSIS — R05 Cough: Secondary | ICD-10-CM

## 2015-06-18 DIAGNOSIS — I5033 Acute on chronic diastolic (congestive) heart failure: Secondary | ICD-10-CM | POA: Diagnosis not present

## 2015-06-18 MED ORDER — LEVALBUTEROL HCL 0.63 MG/3ML IN NEBU
0.6300 mg | INHALATION_SOLUTION | Freq: Once | RESPIRATORY_TRACT | Status: AC
  Administered 2015-06-18: 0.63 mg via RESPIRATORY_TRACT

## 2015-06-18 NOTE — Progress Notes (Signed)
Subjective:    Patient ID: Jerry Cantrell, male    DOB: 01-Oct-1965, 49 y.o.   MRN: [id number]  HPI 49 year old male smoker with polysubstance abuse with severe sleep apnea on nocturnal BiPAP, chronic hypoxic respiratory failure on O2 He has chronic diastolic heart failure    Significant tests/ events  CT chest 05/2015 -evidence of old granulomatous disease with calcified right hilar lymph nodes, bilateral subcentimeter nodules , some new compared to 02/2010  PSG 01/2015- severe OSA, AHI 99/hour, not corrected by CPAP required BiPAP 25/21 with a full face mask   06/18/2015 post hospital follow-up  : OSA , D-CHF, pulmonary nodule  Patient presents for a post hospital follow-up. Patient recently had a prolonged hospitalization for acute respiratory failure secondary to congestive heart failure exacerbation and cocaine use.  Patient presented with respiratory distress and hypertensive emergency. He required initial intubation with vent support. Patient was treated with aggressive IV diuresis and BiPAP support. Once extubated.  2-D echo showed ejection fracture XX123456, grade 1 diastolic dysfunction. Patient did have some left arm swelling with upper extremity Doppler negative for DVT but showing a superficial thrombus. Unfortunately, patient left AGAINST MEDICAL ADVICE prior to discharge being completed. Marland Kitchen He says he is feeling better. However, has some lingering shortness of breath, tightness and congestion.. He has been using his BiPAP at bedtime and with naps. He denies any fever, chest pain, orthopnea, PND, increased leg swelling or hemoptysis. He says that he has not smoked any cigarettes or used cocaine since discharge. Family says he's been too sick to use drugs or drink alcohol.    Review of Systems Constitutional:   No  weight loss, night sweats,  Fevers, chills,  +fatigue, or  lassitude.  HEENT:   No headaches,  Difficulty swallowing,  Tooth/dental problems, or  Sore throat,        No sneezing, itching, ear ache, nasal congestion, post nasal drip,   CV:  No chest pain,  Orthopnea, PND, swelling in lower extremities, anasarca, dizziness, palpitations, syncope.   GI  No heartburn, indigestion, abdominal pain, nausea, vomiting, diarrhea, change in bowel habits, loss of appetite, bloody stools.   Resp:  No chest wall deformity  Skin: no rash or lesions.  GU: no dysuria, change in color of urine, no urgency or frequency.  No flank pain, no hematuria   MS:  No joint pain or swelling.  No decreased range of motion.  No back pain.  Psych:  No change in mood or affect. No depression or anxiety.  No memory loss.         Objective:   Physical Exam  GEN: A/Ox3; pleasant , NAD, morbidly obese , in WC on O2   HEENT:  /AT,  EACs-clear, TMs-wnl, NOSE-clear, THROAT-clear, no lesions, no postnasal drip or exudate noted.   NECK:  Supple w/ fair ROM; no JVD; normal carotid impulses w/o bruits; no thyromegaly or nodules palpated; no lymphadenopathy.  RESP  decreased breath sounds in the bases without, wheezes.no accessory muscle use, no dullness to percussion  CARD:  RRR, no m/r/g  , tr  peripheral edema, pulses intact, no cyanosis or clubbing.  GI:   Soft & nt; nml bowel sounds; no organomegaly or masses detected.  Musco: Warm bil, no deformities or joint swelling noted.   Neuro: alert, no focal deficits noted.    Skin: Warm, no lesions or rashes   CXR 06/11/15 : Interim extubation and removal of NG tube.,  Persistent changes of congestive heart failure  pulmonary edema. No interim change.       Assessment & Plan:

## 2015-06-18 NOTE — Assessment & Plan Note (Signed)
Severe obstructive sleep apnea Patient's continue on nocturnal BiPAP and he uses BiPAP with naps  Plan  Continue on BIPAP At bedtime  And naps.  Work on weight loss  Low salt diet  BIPAP download in 4 weeks

## 2015-06-18 NOTE — Patient Instructions (Signed)
Continue on BIPAP At bedtime  And naps.  Work on weight loss  Low salt diet  BIPAP download in 4 weeks  Wear Oxygen 3l/m .  Great job on no smoking -keep up good work !!!!  Chest xray today .  follow up Dr. Elsworth Soho  In 4 weeks and As needed   Please contact office for sooner follow up if symptoms do not improve or worsen or seek emergency care

## 2015-06-18 NOTE — Assessment & Plan Note (Signed)
Recent exacerbation, now improving Patient is encouraged on medication compliance oxygen use and smoking and drug cessation  Plan    Work on weight loss  Low salt diet

## 2015-06-18 NOTE — Assessment & Plan Note (Signed)
Wear Oxygen 3l/m .  Great job on no smoking -keep up good work !!!!  Chest xray today .  follow up Dr. Elsworth Soho  In 4 weeks and As needed   Please contact office for sooner follow up if symptoms do not improve or worsen or seek emergency care

## 2015-06-19 NOTE — Progress Notes (Signed)
Reviewed & agree with plan  

## 2015-06-19 NOTE — Progress Notes (Signed)
Quick Note:  Called and spoke with pt. Reviewed results and recs. Pt asked what he should do if he still has the congestion and is unable to cough it up. I reviewed the ov recommendations for his ov on 06/18/15 with TP and it explained to him that if he can call us back for an ov or he can go to the ER. Pt voiced understanding and had no further questions. ______

## 2015-06-26 ENCOUNTER — Telehealth: Payer: Self-pay | Admitting: Pulmonary Disease

## 2015-06-26 DIAGNOSIS — G4733 Obstructive sleep apnea (adult) (pediatric): Secondary | ICD-10-CM

## 2015-06-26 NOTE — Telephone Encounter (Signed)
Compliance Report - 9/26 - 10/25  Per Dr. Elsworth Soho: pt needs to improve on usage, needs to use more than 6 hrs nightly.

## 2015-06-26 NOTE — Telephone Encounter (Signed)
Patient says that he took the machine by [facility] and asked them to adjust the pressure on his machine, but they would not do it without a doctor's order.  Pt is having trouble breathing with machine on because there is not enough pressure, he has been using his oxygen (nasal cannula) with his full face mask on the CPAP.  Pt says that he has not been able to sleep for more than 4 hours.

## 2015-06-26 NOTE — Telephone Encounter (Signed)
Pl show me download again tomorrow & I can suggest change in pr

## 2015-06-28 NOTE — Telephone Encounter (Signed)
In Dr. Bari Mantis look at folder

## 2015-06-28 NOTE — Telephone Encounter (Signed)
Order entered.  Patient notified. Nothing further needed.  

## 2015-06-28 NOTE — Telephone Encounter (Signed)
Please lower maximum IPAP to 20 cm Please have DME sent me report on average pressure

## 2015-07-01 ENCOUNTER — Ambulatory Visit: Payer: Self-pay | Admitting: Internal Medicine

## 2015-07-02 DIAGNOSIS — F1420 Cocaine dependence, uncomplicated: Secondary | ICD-10-CM | POA: Insufficient documentation

## 2015-07-04 ENCOUNTER — Ambulatory Visit: Payer: Self-pay | Admitting: Cardiology

## 2015-07-08 ENCOUNTER — Telehealth: Payer: Self-pay | Admitting: Internal Medicine

## 2015-07-08 ENCOUNTER — Other Ambulatory Visit: Payer: Self-pay | Admitting: Family Medicine

## 2015-07-08 NOTE — Telephone Encounter (Signed)
Patient reports recent hospitalizations due to swelling;  Has missed post hosp appt due to being readmitted.  Reports that Friday and Saturday he had increased swelling and so he took an extra Lasix 80 mg. He took no additional potassium.  He states last Monday his PCP ([facility], Dr. Lavone Neri) drew labs but he has not heard any results; I did not see any in Care Everywhere.  He denies SOB. He has no scale at home; he has no recent weights, blood pressure or heart rates.  He had an appointment to see APP on 11/17; I was able to move him up to 11/14 with APP; Dr. Harrington Challenger is in office that day. He is currently in [place] with his father who is very ill with cancer.

## 2015-07-08 NOTE — Telephone Encounter (Signed)
New message  Pt c/o swelling: STAT is pt has developed SOB within 24 hours  1. How long have you been experiencing swelling? 5 days   2. Where is the swelling located? Both Legs   3.  Are you currently taking a "fluid pill"? Yes, Furosemide 80 mg   4.  Are you currently SOB? No really   5.  Have you traveled recently? Yes. Just to [place].   Pt states that he has a lot of fluid build up and he is requesting a call back to determine if he should increase his medications. Please call

## 2015-07-08 NOTE — Telephone Encounter (Signed)
Follow up   Pt is calling for rn returning her call

## 2015-07-08 NOTE — Telephone Encounter (Signed)
Will see along with PA on 11/14

## 2015-07-08 NOTE — Telephone Encounter (Signed)
No answer on mobile (preferred number).  Unable to leave a message.  Left voice mail on home number to call back.

## 2015-07-11 ENCOUNTER — Encounter: Payer: Self-pay | Admitting: Adult Health

## 2015-07-11 ENCOUNTER — Encounter: Payer: Self-pay | Admitting: Pulmonary Disease

## 2015-07-15 ENCOUNTER — Ambulatory Visit: Payer: Medicaid Other | Admitting: Cardiology

## 2015-07-16 ENCOUNTER — Ambulatory Visit (INDEPENDENT_AMBULATORY_CARE_PROVIDER_SITE_OTHER): Payer: Medicaid Other | Admitting: Adult Health

## 2015-07-16 ENCOUNTER — Encounter: Payer: Self-pay | Admitting: Adult Health

## 2015-07-16 ENCOUNTER — Ambulatory Visit (INDEPENDENT_AMBULATORY_CARE_PROVIDER_SITE_OTHER)
Admission: RE | Admit: 2015-07-16 | Discharge: 2015-07-16 | Disposition: A | Discharge status: Home / Self Care | Payer: Medicaid Other | Source: Ambulatory Visit | Attending: Adult Health | Admitting: Adult Health

## 2015-07-16 ENCOUNTER — Other Ambulatory Visit (INDEPENDENT_AMBULATORY_CARE_PROVIDER_SITE_OTHER): Payer: Medicaid Other

## 2015-07-16 VITALS — BP 150/70 | HR 72 | Temp 98.7°F | Ht 73.0 in | Wt 294.0 lb

## 2015-07-16 DIAGNOSIS — J189 Pneumonia, unspecified organism: Secondary | ICD-10-CM

## 2015-07-16 DIAGNOSIS — R05 Cough: Secondary | ICD-10-CM

## 2015-07-16 DIAGNOSIS — G4733 Obstructive sleep apnea (adult) (pediatric): Secondary | ICD-10-CM

## 2015-07-16 DIAGNOSIS — R0600 Dyspnea, unspecified: Secondary | ICD-10-CM | POA: Diagnosis not present

## 2015-07-16 DIAGNOSIS — J9611 Chronic respiratory failure with hypoxia: Secondary | ICD-10-CM | POA: Diagnosis not present

## 2015-07-16 DIAGNOSIS — I5033 Acute on chronic diastolic (congestive) heart failure: Secondary | ICD-10-CM

## 2015-07-16 DIAGNOSIS — R059 Cough, unspecified: Secondary | ICD-10-CM

## 2015-07-16 DIAGNOSIS — J9612 Chronic respiratory failure with hypercapnia: Secondary | ICD-10-CM

## 2015-07-16 LAB — BASIC METABOLIC PANEL
BUN: 24 mg/dL — ABNORMAL HIGH (ref 6–23)
CALCIUM: 8.9 mg/dL (ref 8.4–10.5)
CHLORIDE: 106 meq/L (ref 96–112)
CO2: 33 meq/L — AB (ref 19–32)
Creatinine, Ser: 1.67 mg/dL — ABNORMAL HIGH (ref 0.40–1.50)
GFR: 46.55 mL/min — ABNORMAL LOW (ref >60.00)
GLUCOSE: 65 mg/dL — AB (ref 70–99)
POTASSIUM: 4.1 meq/L (ref 3.5–5.1)
SODIUM: 144 meq/L (ref 135–145)

## 2015-07-16 LAB — BRAIN NATRIURETIC PEPTIDE: Pro B Natriuretic peptide (BNP): 225 pg/mL — ABNORMAL HIGH (ref 0.0–100.0)

## 2015-07-16 NOTE — Assessment & Plan Note (Signed)
Severe OSA improved control on BIPAP  Check ONO on BIPAP   Plan  Continue on BIPAP At bedtime  And naps.  Work on weight loss  Low salt diet  Check ONO on BIPAP.  Wear Oxygen 3l/m .  Work on not smoking  Chest xray and labs today .  Follow up for blood pressure this week as planned.  Follow up Dr. Elsworth Soho  In 6-8 weeks with PFT .  Please contact office for sooner follow up if symptoms do not improve or worsen or seek emergency care

## 2015-07-16 NOTE — Assessment & Plan Note (Signed)
Continue on o2  Check ONO on BIPAP   Plan  Continue on BIPAP At bedtime  And naps.  Work on weight loss  Low salt diet  Check ONO on BIPAP.  Wear Oxygen 3l/m .  Work on not smoking  Chest xray and labs today .  Follow up for blood pressure this week as planned.  Follow up Dr. Elsworth Soho  In 6-8 weeks with PFT .  Please contact office for sooner follow up if symptoms do not improve or worsen or seek emergency care

## 2015-07-16 NOTE — Assessment & Plan Note (Signed)
Recent flare now resolving  Check labs with BNP and follow up cxr   Plan Continue on BIPAP At bedtime  And naps.  Work on weight loss  Low salt diet  Check ONO on BIPAP.  Wear Oxygen 3l/m .  Work on not smoking  Chest xray and labs today .  Follow up for blood pressure this week as planned.  Follow up Dr. Elsworth Soho  In 6-8 weeks with PFT .  Please contact office for sooner follow up if symptoms do not improve or worsen or seek emergency care

## 2015-07-16 NOTE — Patient Instructions (Addendum)
Continue on BIPAP At bedtime  And naps.  Work on weight loss  Low salt diet  Check ONO on BIPAP.  Wear Oxygen 3l/m .  Work on not smoking  Chest xray and labs today .  Follow up for blood pressure this week as planned.  Follow up Dr. Elsworth Soho  In 6-8 weeks with PFT .  Please contact office for sooner follow up if symptoms do not improve or worsen or seek emergency care

## 2015-07-16 NOTE — Assessment & Plan Note (Signed)
Smoking cessation discussed Check PFT on return

## 2015-07-16 NOTE — Progress Notes (Signed)
Subjective:    Patient ID: Jerry Cantrell, male    DOB: Apr 18, 1966, 49 y.o.   MRN: [id number]  HPI 49 year old male smoker with polysubstance abuse with severe sleep apnea on nocturnal BiPAP, chronic hypoxic respiratory failure on O2 He has chronic diastolic heart failure    Significant tests/ events  CT chest 05/2015 -evidence of old granulomatous disease with calcified right hilar lymph nodes, bilateral subcentimeter nodules , some new compared to 02/2010  PSG 01/2015- severe OSA, AHI 99/hour, not corrected by CPAP required BiPAP 25/21 with a full face mask   07/16/2015 Follow up :  : OSA , D-CHF, pulmonary nodule  Pt returns for a 1 month follow up .  He says he is doing a little better. Was able to walk in today with cane.  Gets winded easily with activity .  He remains on BIPAP w/ improved control with less daytime sleepiness.  Download shows good compliance with avg usage at 8.5hr .  AHI 15 . Remains on IPAP /EPAP 20/5 w/ PS 5cmH20 Says he wears his Oxygen -nasal canula at night with bipap . We discussed that will cause leaks with mask. Will need ONO to see if O2 is needed with BIPAP .  Has chronic leg swelling. On lasix 80mg  daily . Has ov with cardiology this week.  Denies fever, chest pain , orthopnea or hemoptysis.  Still smoking, discussed cessation.  Drinks few  Beers. Denies rec drug use since discharge.   Recent hospitalization for  acute respiratory failure secondary to congestive heart failure exacerbation and cocaine use. W/  hypertensive emergency. He required initial intubation with vent support. Patient was treated with aggressive IV diuresis and BiPAP support. Once extubated.  2-D echo showed ejection fracture XX123456, grade 1 diastolic dysfunction. Patient did have some left arm swelling with upper extremity Doppler negative for DVT but showing a superficial thrombus. Unfortunately, patient left AGAINST MEDICAL ADVICE prior to discharge bein      Review of  Systems Constitutional:   No  weight loss, night sweats,  Fevers, chills,  +fatigue, or  lassitude.  HEENT:   No headaches,  Difficulty sw allowing,  Tooth/dental problems, or  Sore throat,                No sneezing, itching, ear ache, nasal congestion, post nasal drip,   CV:  No chest pain,  Orthopnea, PND, , anasarca, dizziness, palpitations, syncope.   GI  No heartburn, indigestion, abdominal pain, nausea, vomiting, diarrhea, change in bowel habits, loss of appetite, bloody stools.   Resp:  No chest wall deformity  Skin: no rash or lesions.  GU: no dysuria, change in color of urine, no urgency or frequency.  No flank pain, no hematuria   MS:  No joint pain or swelling.  No decreased range of motion.  No back pain.  Psych:  No change in mood or affect. No depression or anxiety.  No memory loss.         Objective:   Physical Exam  GEN: A/Ox3; pleasant , NAD, morbidly obese , in WC on O2   HEENT:  [place]/AT,  EACs-clear, TMs-wnl, NOSE-clear, THROAT-clear, no lesions, no postnasal drip or exudate noted.   NECK:  Supple w/ fair ROM; no JVD; normal carotid impulses w/o bruits; no thyromegaly or nodules palpated; no lymphadenopathy.  RESP  decreased breath sounds in the bases without, wheezes.no accessory muscle use, no dullness to percussion  CARD:  RRR, no m/r/g  , 1+  peripheral edema, pulses intact, no cyanosis or clubbing.  GI:   Soft & nt; nml bowel sounds; no organomegaly or masses detected.  Musco: Warm bil, no deformities or joint swelling noted.   Neuro: alert, no focal deficits noted.    Skin: Warm, no lesions or rashes   CXR 06/11/15         Assessment & Plan:

## 2015-07-17 NOTE — Progress Notes (Signed)
Quick Note:  LMTCB on home phone  Called mobile and "call can not be completed at this time, try again later"   ______

## 2015-07-17 NOTE — Progress Notes (Signed)
Quick Note:  LMTCB on home phone  Called mobile and "call can not be completed at this time, try again later"  ______

## 2015-07-18 ENCOUNTER — Encounter: Payer: Self-pay | Admitting: Cardiology

## 2015-07-18 ENCOUNTER — Ambulatory Visit (INDEPENDENT_AMBULATORY_CARE_PROVIDER_SITE_OTHER): Payer: Medicaid Other | Admitting: Cardiology

## 2015-07-18 ENCOUNTER — Ambulatory Visit: Payer: Self-pay | Admitting: Cardiology

## 2015-07-18 VITALS — BP 150/94 | HR 78 | Ht 73.0 in | Wt 290.8 lb

## 2015-07-18 DIAGNOSIS — I5032 Chronic diastolic (congestive) heart failure: Secondary | ICD-10-CM | POA: Diagnosis not present

## 2015-07-18 DIAGNOSIS — R079 Chest pain, unspecified: Secondary | ICD-10-CM | POA: Diagnosis not present

## 2015-07-18 DIAGNOSIS — R0600 Dyspnea, unspecified: Secondary | ICD-10-CM | POA: Diagnosis not present

## 2015-07-18 DIAGNOSIS — I10 Essential (primary) hypertension: Secondary | ICD-10-CM | POA: Diagnosis not present

## 2015-07-18 MED ORDER — NITROGLYCERIN 0.4 MG SL SUBL: 0.4000 mg | SUBLINGUAL_TABLET | SUBLINGUAL | Status: DC | PRN

## 2015-07-18 NOTE — Progress Notes (Addendum)
07/18/2015 Jerry Cantrell   1966/04/28  [id number]  Primary Physician Jerry Rima, MD Primary Cardiologist: Dr. Harrington Cantrell   Reason for Visit/CC: Hospital Follow-up for Diastolic CHF.   HPI:  49 yo male w/ hx CAP & resp failure, sepsis, ARF, acute encephalopathy in 2015. Hx HTN, OSA compliant with CPAP, DM2, D-CHF, HL, tob use. He was recently admitted 09/12 w/ sharp chest pain in the setting of cocaine use, volume overload and periph neuropathy.  Jerry Cantrell admitting presentation including volume overload, dyspnea, and hypoxemia without changes in sputum, fever, or acute onset was most consistent with CHF exacerbation. He was diuresed with IV lasix and transitioned to PO, 80 mg daily. Troponin was mildly elevated at 0.14 with nonspecific ECG changes. Heparin was started initially for concern of ACS though troponin plateaued consistent with demand ischemia and no invasive diagnostic or therapeutic intervention was undertaken. However it was recommended that he f/u with an outpatient NST. This has yet to be completed. He does note a h/o occasional chest tightness.   He reports that he has done fairly well. He denies any resting dyspnea. No exertional dyspnea, orthopnea or PND. He does have mild LEE, worse after prolonged walking/ standing. He denies high sodium intake. Unfortunately, he continues to smokes. He denies any other drug use since his hospitalization.     Current Outpatient Prescriptions  Medication Sig Dispense Refill  . albuterol (PROVENTIL HFA;VENTOLIN HFA) 108 (90 BASE) MCG/ACT inhaler Inhale 2 puffs into the lungs every 4 (four) hours as needed for wheezing or shortness of breath. 1 Inhaler 6  . amitriptyline (ELAVIL) 50 MG tablet Take 1 tablet (50 mg total) by mouth at bedtime. 30 tablet 1  . aspirin 325 MG tablet Take 1 tablet (325 mg total) by mouth daily. 30 tablet 0  . atorvastatin (LIPITOR) 80 MG tablet Take 1 tablet (80 mg total) by mouth daily. 30 tablet 0  .  carvedilol (COREG) 25 MG tablet Take 1 tablet (25 mg total) by mouth 2 (two) times daily with a meal. 28 tablet 0  . cloNIDine (CATAPRES) 0.2 MG tablet Take 1 tablet (0.2 mg total) by mouth 3 (three) times daily. 90 tablet 0  . fenofibrate 160 MG tablet Take 1 tablet (160 mg total) by mouth daily. 30 tablet 0  . fluticasone (FLONASE) 50 MCG/ACT nasal spray Place 1-2 sprays into both nostrils 2 (two) times daily. 9.9 g 0  . furosemide (LASIX) 80 MG tablet Take 1 tablet (80 mg total) by mouth daily as needed for edema. 10 tablet 0  . gabapentin (NEURONTIN) 300 MG capsule Take 1 capsule (300 mg total) by mouth 3 (three) times daily. 200 mg in the morning and 100 mg in the evening. 90 capsule 0  . glucose blood test strip Use as instructed 100 each 12  . hydrALAZINE (APRESOLINE) 100 MG tablet Take 1 tablet (100 mg total) by mouth 3 (three) times daily. 90 tablet 3  . insulin aspart (NOVOLOG) 100 UNIT/ML injection Inject into the skin. Inject into the skin as directed based on sliding scale daily    . insulin glargine (LANTUS) 100 UNIT/ML injection Inject into the skin 2 (two) times daily. Inject 45 units into the skin daily in the morning. Inject 35 units into the skin daily at night    . omega-3 acid ethyl esters (LOVAZA) 1 G capsule Take 2 g by mouth 2 (two) times daily.     . Polyvinyl Alcohol-Povidone (REFRESH OP) Place 2 drops into both eyes  3 (three) times daily as needed (dry eyes).    . potassium chloride 20 MEQ TBCR take 2 tabs (6mEq) for 6 days, then 1 tab (23mEq) daily 40 tablet 0  . verapamil (CALAN-SR) 120 MG CR tablet Take 1 tablet (120 mg total) by mouth at bedtime. 8 tablet 0   No current facility-administered medications for this visit.    No Known Allergies  Social History   Social History  . Marital Status: Single    Spouse Name: N/A  . Number of Children: N/A  . Years of Education: N/A   Occupational History  . dietary services [employer]   Social History Main Topics    . Smoking status: Current Every Day Smoker -- 0.40 packs/day for 30 years    Types: Cigarettes  . Smokeless tobacco: Never Used     Comment: "Trying - hard to stop"  . Alcohol Use: 3.6 oz/week    6 Cans of beer per week     Comment: beer  . Drug Use: Yes    Special: Marijuana     Comment: marijuana "laced with something"  . Sexual Activity: Not on file   Other Topics Concern  . Not on file   Social History Narrative     Review of Systems: General: negative for chills, fever, night sweats or weight changes.  Cardiovascular: negative for chest pain, dyspnea on exertion, edema, orthopnea, palpitations, paroxysmal nocturnal dyspnea or shortness of breath Dermatological: negative for rash Respiratory: negative for cough or wheezing Urologic: negative for hematuria Abdominal: negative for nausea, vomiting, diarrhea, bright red blood per rectum, melena, or hematemesis Neurologic: negative for visual changes, syncope, or dizziness All other systems reviewed and are otherwise negative except as noted above.    Blood pressure 150/94, pulse 78, height 6\' 1"  (1.854 m), weight 290 lb 12.8 oz (131.906 kg).  General appearance: alert, cooperative, no distress and moderately obese Neck: no carotid bruit and no JVD Lungs: clear to auscultation bilaterally Heart: regular rate and rhythm, S1, S2 normal, no murmur, click, rub or gallop Extremities: 1+ tibial edema R>L Pulses: 2+ and symmetric Skin: warm and dry Neurologic: Grossly normal  EKG NSR. 78 bpm. No ischemia.   ASSESSMENT AND PLAN:    1. Chronic Diastolic CHF: fairly stable with mild 1+ pretibial edema, R>L. Lungs are CTAB. No JVD. No resting or exertional dyspnea. Continue daily lasix. We discussed low sodium diet and daily weights. We will try to get him setup with the [facility] to assist with HF needs and education. Hopefully, this service can provide him with a home scale, since he is low income and cannot afford to  purchase one. We also discussed elevating his legs to help with edema.   2. Chest Pain: he notes occasional resting chest tightness. Currently CP free. EKG w/o ischemia. However he has multiple cardiac risk factors including HTN, DM, obesity and tobacco abuse. It was also advised that he undergo OP NST when he was recently discharged. Will arrange a lexiscan NST, given mild visual impairment. Rx SL NTG. Instructions on use given.   3. HTN: mildly elevated today. Continue Coreg and verapamil. Low sodium diet. Smoking cessation advised.   4. DM: per primary  5. Tobacco Abuse: smoking cessation strongly advised.   PLAN  Continue current HF regimen. NST to w/o ischemia. Plan to f/u with Dr. Harrington Cantrell in 2 months or sooner if abnormal NST. Will follow-up on stress test result.   Lyda Jester PA-C 07/18/2015 4:11 PM

## 2015-07-18 NOTE — Patient Instructions (Signed)
Medication Instructions:  The current medical regimen is effective;  continue present plan and medications.  Testing/Procedures: Your physician has requested that you have a lexiscan myoview. For further information please visit [email or web address]. Please follow instruction sheet, as given.  Follow-Up: Follow up in 2 months with Dr Harrington Challenger.  If you need a refill on your cardiac medications before your next appointment, please call your pharmacy.  Thank you for choosing [facility]!!

## 2015-07-18 NOTE — Addendum Note (Signed)
Addended by: Consuelo Pandy on: 07/18/2015 04:53 PM   Modules accepted: Orders

## 2015-07-18 NOTE — Progress Notes (Signed)
Quick Note:  atc pt, cell phone rang with no option to leave message Home phone was unavailable to receive call  wcb ______

## 2015-07-19 ENCOUNTER — Telehealth: Payer: Self-pay | Admitting: Pulmonary Disease

## 2015-07-19 NOTE — Telephone Encounter (Signed)
Per CXR results: Result Note     Improvement on left     Right effusion persist, cont on current regimen with diuretics . (BNP was not elevated )     Repeat cxr on return , if persists consider repeat CT chest -last was 05/2015 with right pleural efffusion     Recent admission with vent support , clinically is improved on BIPAP .     Will recehck CXR on return and follow closely     Please contact office for sooner follow up if symptoms do not improve or worsen or seek emergency care     Per lab results: Result Note     CHF marker is ok, lower     Kidney function is slightly better.     Blood sugar was way too low. Do not skip meals. Check sugars closely with insulin doses .     follow up with PCP /DM specalist if BS lows continue.     Cont w/ ov recs     Please contact office for sooner follow up if symptoms do not improve or worsen or seek emergency care    -----   I spoke with patient about results and he verbalized understanding and had no questions

## 2015-07-19 NOTE — Progress Notes (Signed)
Reviewed & agree with plan  

## 2015-07-23 ENCOUNTER — Telehealth (HOSPITAL_COMMUNITY): Payer: Self-pay

## 2015-07-23 NOTE — Telephone Encounter (Signed)
Patient given detailed instructions per Myocardial Perfusion Study Information Sheet for the test on 07-30-2015 at 1230. Patient notified to arrive 15 minutes early and that it is imperative to arrive on time for appointment to keep from having the test rescheduled.  If you need to cancel or reschedule your appointment, please call the office within 24 hours of your appointment. Failure to do so may result in a cancellation of your appointment, and a $50 no show fee. Patient verbalized understanding.Oletta Lamas, Lassie Demorest A

## 2015-07-30 ENCOUNTER — Ambulatory Visit (HOSPITAL_COMMUNITY): Payer: Medicaid Other | Attending: Cardiovascular Disease

## 2015-07-30 DIAGNOSIS — R002 Palpitations: Secondary | ICD-10-CM | POA: Diagnosis not present

## 2015-07-30 DIAGNOSIS — R079 Chest pain, unspecified: Secondary | ICD-10-CM | POA: Diagnosis not present

## 2015-07-30 DIAGNOSIS — I517 Cardiomegaly: Secondary | ICD-10-CM | POA: Diagnosis not present

## 2015-07-30 DIAGNOSIS — I10 Essential (primary) hypertension: Secondary | ICD-10-CM | POA: Diagnosis not present

## 2015-07-30 DIAGNOSIS — R0600 Dyspnea, unspecified: Secondary | ICD-10-CM | POA: Diagnosis not present

## 2015-07-30 DIAGNOSIS — E119 Type 2 diabetes mellitus without complications: Secondary | ICD-10-CM | POA: Insufficient documentation

## 2015-07-30 MED ORDER — REGADENOSON 0.4 MG/5ML IV SOLN
0.4000 mg | Freq: Once | INTRAVENOUS | Status: AC
  Administered 2015-07-30: 0.4 mg via INTRAVENOUS

## 2015-07-30 MED ORDER — TECHNETIUM TC 99M SESTAMIBI GENERIC - CARDIOLITE
32.7000 | Freq: Once | INTRAVENOUS | Status: AC | PRN
  Administered 2015-07-30: 32.7 via INTRAVENOUS

## 2015-07-31 ENCOUNTER — Ambulatory Visit (HOSPITAL_COMMUNITY): Payer: Medicaid Other | Attending: Cardiovascular Disease

## 2015-07-31 LAB — MYOCARDIAL PERFUSION IMAGING
CHL CUP RESTING HR STRESS: 68 {beats}/min
LHR: 0.31
LV dias vol: 266 mL
LVSYSVOL: 164 mL
NUC STRESS TID: 1.11
Peak HR: 85 {beats}/min
SDS: 1
SRS: 2
SSS: 3

## 2015-07-31 MED ORDER — TECHNETIUM TC 99M SESTAMIBI GENERIC - CARDIOLITE
33.0000 | Freq: Once | INTRAVENOUS | Status: AC | PRN
  Administered 2015-07-31: 33 via INTRAVENOUS

## 2015-08-02 ENCOUNTER — Telehealth: Payer: Self-pay | Admitting: *Deleted

## 2015-08-02 NOTE — Telephone Encounter (Signed)
-----   Message from Consuelo Pandy, [place] sent at 08/01/2015  4:46 PM EST ----- Low risk stress test. Normal study.

## 2015-08-02 NOTE — Telephone Encounter (Signed)
Called and let pt know that per Ellen Henri, PA-C, that his stress test was a low risk, normal study.  Pt verified his f/u appt with  Dr. Harrington Challenger 09/23/15.  Pt verbalized understanding.

## 2015-08-05 ENCOUNTER — Other Ambulatory Visit: Payer: Self-pay | Admitting: Family Medicine

## 2015-08-05 NOTE — Addendum Note (Signed)
Addended by: Freada Bergeron on: 08/05/2015 05:25 PM   Modules accepted: Orders

## 2015-08-07 ENCOUNTER — Telehealth: Payer: Self-pay | Admitting: Internal Medicine

## 2015-08-07 ENCOUNTER — Encounter (HOSPITAL_COMMUNITY): Payer: Self-pay | Admitting: Emergency Medicine

## 2015-08-07 ENCOUNTER — Emergency Department (HOSPITAL_COMMUNITY): Payer: Medicaid Other

## 2015-08-07 ENCOUNTER — Emergency Department (HOSPITAL_COMMUNITY)
Admission: EM | Admit: 2015-08-07 | Discharge: 2015-08-07 | Discharge status: Against Medical Advice | Payer: Medicaid Other | Attending: Emergency Medicine | Admitting: Emergency Medicine

## 2015-08-07 ENCOUNTER — Telehealth: Payer: Self-pay | Admitting: Adult Health

## 2015-08-07 ENCOUNTER — Other Ambulatory Visit: Payer: Self-pay

## 2015-08-07 DIAGNOSIS — I5031 Acute diastolic (congestive) heart failure: Secondary | ICD-10-CM | POA: Diagnosis not present

## 2015-08-07 DIAGNOSIS — I10 Essential (primary) hypertension: Secondary | ICD-10-CM | POA: Insufficient documentation

## 2015-08-07 DIAGNOSIS — E669 Obesity, unspecified: Secondary | ICD-10-CM | POA: Insufficient documentation

## 2015-08-07 DIAGNOSIS — I509 Heart failure, unspecified: Secondary | ICD-10-CM | POA: Insufficient documentation

## 2015-08-07 DIAGNOSIS — Z5321 Procedure and treatment not carried out due to patient leaving prior to being seen by health care provider: Secondary | ICD-10-CM

## 2015-08-07 DIAGNOSIS — R079 Chest pain, unspecified: Secondary | ICD-10-CM | POA: Diagnosis present

## 2015-08-07 DIAGNOSIS — F17210 Nicotine dependence, cigarettes, uncomplicated: Secondary | ICD-10-CM | POA: Diagnosis not present

## 2015-08-07 DIAGNOSIS — E1121 Type 2 diabetes mellitus with diabetic nephropathy: Secondary | ICD-10-CM | POA: Diagnosis not present

## 2015-08-07 DIAGNOSIS — E1140 Type 2 diabetes mellitus with diabetic neuropathy, unspecified: Secondary | ICD-10-CM | POA: Diagnosis not present

## 2015-08-07 LAB — BASIC METABOLIC PANEL
ANION GAP: 9 (ref 5–15)
BUN: 31 mg/dL — ABNORMAL HIGH (ref 6–20)
CALCIUM: 8.4 mg/dL — AB (ref 8.9–10.3)
CO2: 29 mmol/L (ref 22–32)
Chloride: 103 mmol/L (ref 101–111)
Creatinine, Ser: 2.26 mg/dL — ABNORMAL HIGH (ref 0.61–1.24)
GFR calc Af Amer: 37 mL/min — ABNORMAL LOW (ref >60)
GFR calc non Af Amer: 32 mL/min — ABNORMAL LOW (ref >60)
GLUCOSE: 209 mg/dL — AB (ref 65–99)
Potassium: 3.8 mmol/L (ref 3.5–5.1)
Sodium: 141 mmol/L (ref 135–145)

## 2015-08-07 LAB — BRAIN NATRIURETIC PEPTIDE: B NATRIURETIC PEPTIDE 5: 175.3 pg/mL — AB (ref 0.0–100.0)

## 2015-08-07 LAB — CBC
HCT: 45.3 % (ref 39.0–52.0)
HEMOGLOBIN: 14.3 g/dL (ref 13.0–17.0)
MCH: 27.9 pg (ref 26.0–34.0)
MCHC: 31.6 g/dL (ref 30.0–36.0)
MCV: 88.3 fL (ref 78.0–100.0)
Platelets: 170 10*3/uL (ref 150–400)
RBC: 5.13 MIL/uL (ref 4.22–5.81)
RDW: 14.6 % (ref 11.5–15.5)
WBC: 8.3 10*3/uL (ref 4.0–10.5)

## 2015-08-07 LAB — I-STAT TROPONIN, ED: TROPONIN I, POC: 0.03 ng/mL (ref 0.00–0.08)

## 2015-08-07 NOTE — Telephone Encounter (Signed)
New message     Talk to nurse about getting pt set up for telemonitoring.  She would not tell me what this means.

## 2015-08-07 NOTE — Telephone Encounter (Signed)
He has not been on CPAP long enough- generally tubing does not cause infection. He can call us for antibiotic if he has yellow/green phlegm

## 2015-08-07 NOTE — ED Notes (Signed)
No answer

## 2015-08-07 NOTE — Telephone Encounter (Signed)
Called spoke with pt and made aware of RA recs. Nothing further needed

## 2015-08-07 NOTE — Telephone Encounter (Signed)
Spoke with the pt  He is c/o tightness in the center of his chest for the past 4 days He states that this occurs when he takes in a breath in when he exhales  He has not had any increased cough or SOB  He is concerned that he has a bacterial infection from not keeping up with CPAP tubing changes I offered appt but he refused due to lack of transportation  RA, please advise, thanks!

## 2015-08-07 NOTE — ED Notes (Signed)
Pt sts mid sternal CP with increased fluid; pt sts hx of same; pt sts pain worse with inspiration; pt sts pain x 3 days

## 2015-08-13 ENCOUNTER — Telehealth: Payer: Self-pay | Admitting: Pulmonary Disease

## 2015-08-13 ENCOUNTER — Encounter: Payer: Self-pay | Admitting: Family Medicine

## 2015-08-13 NOTE — Telephone Encounter (Signed)
Attempted to call pt. No answer. Will try back. 

## 2015-08-14 NOTE — Telephone Encounter (Signed)
Follow up      Calling to get status on getting pt set up for telemonitoring.  Please call

## 2015-08-14 NOTE — Telephone Encounter (Signed)
Lm  TO CALL BACK .Adonis Housekeeper

## 2015-08-14 NOTE — Telephone Encounter (Signed)
ANN  NOTIFIED  THAT DR  ROSS NOT HERE TODAY  BUT TO   TO FAX  ORDERS   AND  WILL   ASK DR ROSS  TOMORROW  IF  WILLING  TO  ORDER  FAX  NUMBER GIVEN  .Adonis Housekeeper

## 2015-08-16 NOTE — Telephone Encounter (Signed)
lmtcb for pt.  

## 2015-08-16 NOTE — ED Provider Notes (Signed)
Patient left the emergency department prior to being seen and examined by him myself.  Patient apparently had capacity to make that decision.  Leonard Schwartz, MD 08/16/15 [phone]

## 2015-08-16 NOTE — Telephone Encounter (Signed)
Received referral form needing filled out for chronic disease tele-monitoring. Called and left voicemail for Kara Pacer, RN at her direct line: [phone] that Dr. Harrington Challenger does not typically manage tele-monitoring and she has not referred this patient for this service.

## 2015-09-06 ENCOUNTER — Telehealth: Payer: Self-pay | Admitting: Internal Medicine

## 2015-09-06 ENCOUNTER — Telehealth: Payer: Self-pay | Admitting: Pulmonary Disease

## 2015-09-06 MED ORDER — FUROSEMIDE 80 MG PO TABS: 80.0000 mg | ORAL_TABLET | Freq: Every day | ORAL | Status: DC

## 2015-09-06 NOTE — Telephone Encounter (Signed)
Patient says he is being volleyed between doctors and has not been able to get his Lasix under control. He says he retains a lot of fluid.  He takes 1 pill a day of Lasix for his fluid.  He has been taking 2-3 pills at least 1 time weekly as well as 1 daily due to increased fluid.  He says that medicaid will not refill his Lasix because it only says 1 pill daily.  He needs a new prescription sent to [facility] saying that he can take 2-3 tablets if needed for his fluid build up.  He says that he does not want to go back to the hospital again.  He says that his next appointment is in February.  (schedule 10/28/15 for CXR, PFT and appt w/ RA), but he can be seen sooner if he needs too.  He just has to get a bus to bring him because he is visually impaired, so he would need to know a week in advance before he can come to appointment.  Dr. Elsworth Soho, please advise.

## 2015-09-06 NOTE — Telephone Encounter (Signed)
Pt returning call and can be reached @ same.Stanley A Dalton ° °

## 2015-09-06 NOTE — Telephone Encounter (Signed)
[place] and spoke with Manuela Schwartz clarifying furosemide 80 mg tablets, taken 1 tablet daily and may take an additional tablet for swelling. Dispensing 45 tablets. Manuela Schwartz verbalized understanding.

## 2015-09-06 NOTE — Telephone Encounter (Signed)
Patient reports that he keeps running out of Lasix early because he takes extra due to increased swelling about once every 1-2 weeks. He last did this on Monday 09/02/15.  Depending on the edema, he takes 1-2 EXTRA Lasix 80 mg tablets at one time.  Pt denies increased SOB, takes for edema only.  Educated on use of Lasix, adverse effects including electrolyte imbalances and worsening renal function.  Pt is in process of getting a scale and he will begin to record his daily weights. Discussed dietary changes that could help improve edema.  Pt has appointment with Dr. Justin Mend at [facility] on 1/17 and Dr. Harrington Challenger on 1/23.  He is aware that I am forwarding to Dr. Harrington Challenger for further recommendations and will call him back. He will run out of lasix early again, since he currently has only a few left at this time.

## 2015-09-06 NOTE — Telephone Encounter (Signed)
Called pt at number provided--line rang numerous times, no NA and no VM. WCB

## 2015-09-06 NOTE — Telephone Encounter (Signed)
Refilled lasix. Informed patient.

## 2015-09-06 NOTE — Telephone Encounter (Signed)
New Message  Pt c/o swelling: STAT is pt has developed SOB within 24 hours  1. How long have you been experiencing swelling? For about 30 days   2. Where is the swelling located?  3.  Are you currently taking a "fluid pill"? Yes! When he begins to see a lot of fluid. He takes an extra 2 pills but its making is prescriptions run short by the end of the month. So he will either need a refill or increase the amount that he can receive (90 day)   4.  Are you currently SOB? NO   5.  Have you traveled recently?NO   Comments: Fluid continues to build. Taking more fluid pills. Fluid pills running low. Please call back to discuss how to move forward.

## 2015-09-06 NOTE — Telephone Encounter (Signed)
°*  STAT* If patient is at the pharmacy, call can be transferred to refill team.   1. Which medications need to be refilled? (please list name of each medication and dose if known) Lasix 80 mg   2. Which pharmacy/location (including street and city if local pharmacy)[facility]   3. Do they need a 30 day or 90 day supply?   Comments: the first script says 1/2 tab twice daily and the second script says 1 daily and can take an additional dose for swelling.   Which one is the correct direction?

## 2015-09-06 NOTE — Telephone Encounter (Signed)
Refill so that he has extra as needed

## 2015-09-07 NOTE — Telephone Encounter (Signed)
Okay to prescribe twice daily-current dose for 30 days Have him come in to see TP in January

## 2015-09-09 MED ORDER — FUROSEMIDE 80 MG PO TABS: 80.0000 mg | ORAL_TABLET | Freq: Two times a day (BID) | ORAL | Status: DC

## 2015-09-09 NOTE — Telephone Encounter (Signed)
rx sent to pharmacy.  lmtcb X1 for pt to schedule ov with TP in January.

## 2015-09-10 NOTE — Telephone Encounter (Signed)
Patient scheduled to see TP on 09/24/15. Patient aware of appointment. Nothing further needed.

## 2015-09-22 NOTE — Progress Notes (Signed)
Cardiology Office Note   Date:  09/24/2015   ID:  Jerry Cantrell, DOB [birth year], MRN [id number]  PCP:  Helane Rima, MD  Cardiologist:   Dorris Carnes, MD   F/P of diastolic/ systolic CHF      History of Present Illness: Jerry Cantrell is a 50 y.o. male with a history of CAP & resp failure, sepsis, ARF, acute encephalopathy in 2015. Hx HTN, OSA compliant with CPAP, DM2, D-CHF, HL, tob use. He was  admitted 05/13/15  w/ sharp chest pain in the setting of cocaine use, volume overload  He was diuresed with IV lasix and transitioned to PO, 80 mg daily. Troponin was mildly elevated at 0.14 with nonspecific ECG changes. Heparin was started initially for concern of ACS though troponin plateaued consistent with demand ischemia and no invasive diagnostic or therapeutic intervention was undertaken. However it was recommended that he f/u with an outpatient NST. This has yet to be completed. He does note a h/o occasional chest tightness.   She was seen by B Rosita Fire in November 2016 A stess myovue afer showed normal perfusion LVEF 35%  Breathing is OK  Uses BIPAP    Current Outpatient Prescriptions  Medication Sig Dispense Refill  . albuterol (PROVENTIL HFA;VENTOLIN HFA) 108 (90 BASE) MCG/ACT inhaler Inhale 2 puffs into the lungs every 4 (four) hours as needed for wheezing or shortness of breath. 1 Inhaler 6  . amitriptyline (ELAVIL) 50 MG tablet Take 1 tablet (50 mg total) by mouth at bedtime. 30 tablet 1  . aspirin 325 MG tablet Take 1 tablet (325 mg total) by mouth daily. 30 tablet 0  . atorvastatin (LIPITOR) 80 MG tablet Take 1 tablet (80 mg total) by mouth daily. 30 tablet 0  . carvedilol (COREG) 25 MG tablet Take 1 tablet (25 mg total) by mouth 2 (two) times daily with a meal. 28 tablet 0  . cloNIDine (CATAPRES) 0.2 MG tablet Take 1 tablet (0.2 mg total) by mouth 3 (three) times daily. 90 tablet 0  . fluticasone (FLONASE) 50 MCG/ACT nasal spray Place 1-2 sprays into both nostrils 2 (two) times  daily. 9.9 g 0  . furosemide (LASIX) 80 MG tablet Take 1 tablet (80 mg total) by mouth 2 (two) times daily. 60 tablet 0  . gabapentin (NEURONTIN) 300 MG capsule Take 1 capsule (300 mg total) by mouth 3 (three) times daily. 200 mg in the morning and 100 mg in the evening. 90 capsule 0  . hydrALAZINE (APRESOLINE) 100 MG tablet Take 1 tablet (100 mg total) by mouth 3 (three) times daily. (Patient taking differently: Take 25 mg by mouth 3 (three) times daily. ) 90 tablet 3  . insulin aspart (NOVOLOG) 100 UNIT/ML injection Inject into the skin. Inject into the skin as directed based on sliding scale daily    . insulin glargine (LANTUS) 100 UNIT/ML injection Inject into the skin 2 (two) times daily. Inject 45 units into the skin daily in the morning. Inject 35 units into the skin daily at night    . nitroGLYCERIN (NITROSTAT) 0.4 MG SL tablet Place 1 tablet (0.4 mg total) under the tongue every 5 (five) minutes as needed for chest pain. 25 tablet prn  . Polyvinyl Alcohol-Povidone (REFRESH OP) Place 2 drops into both eyes 3 (three) times daily as needed (dry eyes).    . potassium chloride 20 MEQ TBCR take 2 tabs (37mEq) for 6 days, then 1 tab (58mEq) daily 40 tablet 0  . verapamil (CALAN-SR) 120 MG CR tablet  Take 1 tablet (120 mg total) by mouth at bedtime. 8 tablet 0  . minoxidil (LONITEN) 2.5 MG tablet Take 2 tablets (5 mg total) by mouth daily. 60 tablet 11   No current facility-administered medications for this visit.    Allergies:   Review of patient's allergies indicates no known allergies.   Past Medical History  Diagnosis Date  . Hypertension   . Hyperlipidemia   . Meralgia paraesthetica 05/03/2012  . Lumbar spondylosis 05/03/2012  . Lumbar spinal stenosis 05/03/2012    Mild with only right L4 nerve root encroachment, no neurogenic claudication   . Urinary incontinence 05/12/2012    Since the start of Sept. 2013   . Hemorrhoids 05/12/2012  . UTI (lower urinary tract infection)   . Meralgia  paraesthetica 05/03/2012    On Lyrica which does improve pain.   Marland Kitchen Neuropathy in diabetes ([place]) 05/12/2012  . Substance abuse   . Hyperlipidemia   . Pneumonia   . Diabetes mellitus   . Diabetes mellitus with nephropathy ([place]) 05/12/2012  . Acute diastolic heart failure ([place])   . Retinopathy   . CHF (congestive heart failure) ([place])   . Obesity     Past Surgical History  Procedure Laterality Date  . Tonsillectomy    . Abdominal surgery      Abscess I&D 2/2 infected hair  . Colonoscopy w/ polypectomy      pt to bring records     Social History:  The patient  reports that he has been smoking Cigarettes.  He has a 12 pack-year smoking history. He has never used smokeless tobacco. He reports that he drinks about 3.6 oz of alcohol per week. He reports that he uses illicit drugs (Marijuana and Cocaine).   Family History:  The patient's family history includes Alcohol abuse in his father; Emphysema in his father; Prostate cancer in his father.    ROS:  Please see the history of present illness. All other systems are reviewed and  Negative to the above problem except as noted.    PHYSICAL EXAM: VS:  BP 184/118 mmHg  Pulse 75  Ht 6\' 1"  (1.854 m)  Wt 135.988 kg (299 lb 12.8 oz)  BMI 39.56 kg/m2  SpO2 97%  [id number] obese  in no acute distress HEENT: normal Neck: Difficlut to assess JVP  carotid bruits, or masses Cardiac: RRR; no murmurs, rubs, or gallops,1+ edema  Respiratory:  clear to auscultation bilaterally, normal work of breathing GI: soft, nontender, nondistended, + BS  No hepatomegaly  MS: no deformity Moving all extremities   Skin: warm and dry, no rash Neuro:  Strength and sensation are intact Psych: euthymic mood, full affect   EKG:  EKG is not ordered today.   Lipid Panel    Component Value Date/Time   CHOL 191 05/15/2015 0250   TRIG 239* 06/11/2015 0239   HDL 37* 05/15/2015 0250   CHOLHDL 5.2 05/15/2015 0250   VLDL 28 05/15/2015 0250   LDLCALC 126*  05/15/2015 0250   LDLDIRECT 99 09/16/2010 2130      Wt Readings from Last 3 Encounters:  09/23/15 135.988 kg (299 lb 12.8 oz)  08/07/15 132.178 kg (291 lb 6.4 oz)  07/30/15 131.543 kg (290 lb)      ASSESSMENT AND PLAN:  1.  Chronic systolic /diastolic CHF   Will get labs today  SOme volume increase  Need to get records from renal clinic [facility] ) before making any changes.    2.  HTN  Pts BP is out of control  Noted from [facility] re this  I would add 5 mg Minoxidil to regimen  Need to f/u bp    He has f/u in a couple weeks with [facility] I will see him in April      Disposition:   FU with  in   Signed, Dorris Carnes, MD  09/24/2015 6:25 PM    [address], [place], [place]  [address] Phone: [phone]; Fax: [phone]

## 2015-09-23 ENCOUNTER — Encounter: Payer: Self-pay | Admitting: Internal Medicine

## 2015-09-23 ENCOUNTER — Ambulatory Visit (INDEPENDENT_AMBULATORY_CARE_PROVIDER_SITE_OTHER): Payer: Medicaid Other | Admitting: Internal Medicine

## 2015-09-23 VITALS — BP 184/118 | HR 75 | Ht 73.0 in | Wt 299.8 lb

## 2015-09-23 DIAGNOSIS — I10 Essential (primary) hypertension: Secondary | ICD-10-CM

## 2015-09-23 DIAGNOSIS — I5030 Unspecified diastolic (congestive) heart failure: Secondary | ICD-10-CM

## 2015-09-23 DIAGNOSIS — I110 Hypertensive heart disease with heart failure: Secondary | ICD-10-CM

## 2015-09-23 LAB — COMPREHENSIVE METABOLIC PANEL
ALBUMIN: 2.9 g/dL — AB (ref 3.6–5.1)
ALK PHOS: 100 U/L (ref 40–115)
ALT: 11 U/L (ref 9–46)
AST: 9 U/L — ABNORMAL LOW (ref 10–35)
BUN: 25 mg/dL (ref 7–25)
CHLORIDE: 103 mmol/L (ref 98–110)
CO2: 29 mmol/L (ref 20–31)
CREATININE: 2.17 mg/dL — AB (ref 0.70–1.33)
Calcium: 8.6 mg/dL (ref 8.6–10.3)
Glucose, Bld: 132 mg/dL — ABNORMAL HIGH (ref 65–99)
POTASSIUM: 3.9 mmol/L (ref 3.5–5.3)
SODIUM: 142 mmol/L (ref 135–146)
TOTAL PROTEIN: 5.6 g/dL — AB (ref 6.1–8.1)
Total Bilirubin: 0.4 mg/dL (ref 0.2–1.2)

## 2015-09-23 MED ORDER — MINOXIDIL 2.5 MG PO TABS: 5.0000 mg | ORAL_TABLET | Freq: Every day | ORAL | Status: DC

## 2015-09-23 NOTE — Patient Instructions (Signed)
Your physician has recommended you make the following change in your medication:  1) start minoxidil 5 mg daily (TAKE TWO 2.5 MG TABLETS)  Your physician recommends that you return for lab work TODAY (CMET, BNP)  Your physician recommends that you schedule a follow-up appointment in: [place] DR. ROSS OR BRITTAINY SIMMONS.

## 2015-09-24 ENCOUNTER — Ambulatory Visit: Payer: Self-pay | Admitting: Adult Health

## 2015-09-24 LAB — BRAIN NATRIURETIC PEPTIDE: BRAIN NATRIURETIC PEPTIDE: 289.7 pg/mL — AB (ref 0.0–100.0)

## 2015-09-26 ENCOUNTER — Telehealth: Payer: Self-pay | Admitting: Internal Medicine

## 2015-09-26 NOTE — Telephone Encounter (Signed)
New Message  Pt called for labs

## 2015-09-26 NOTE — Telephone Encounter (Signed)
Patient's medicine list shows he takes lasix 80 mg twice daily. The patient states today that he takes 80 mg every morning and 1-2 xs per week, if he see extra swelling he takes a second dose. Routing back to Dr. Harrington Challenger to clarify based on this information. Patient is aware I will call him back.

## 2015-09-27 ENCOUNTER — Telehealth: Payer: Self-pay | Admitting: Internal Medicine

## 2015-09-27 NOTE — Telephone Encounter (Signed)
Patient will be seeing renal on 2/16 and will have labs drawn there.  Asked that those labs be forwarded to Dr. Harrington Challenger.  Patient verbalizes understanding.    He has a BP cuff that talks the readings and is keeping track since starting minoxidil 5 mg.   Did one reading while on the phone : 169/68, 74.  He feels this is an improvement and is pleased.  I asked him to keep monitoring.  Reminded him of appointment with APP 11/26/15.

## 2015-09-27 NOTE — Telephone Encounter (Signed)
Mr. Morrice is calling about his labs

## 2015-10-01 ENCOUNTER — Ambulatory Visit: Payer: Self-pay | Admitting: Adult Health

## 2015-10-02 ENCOUNTER — Other Ambulatory Visit: Payer: Self-pay | Admitting: Pulmonary Disease

## 2015-10-08 ENCOUNTER — Telehealth: Payer: Self-pay | Admitting: Internal Medicine

## 2015-10-08 NOTE — Telephone Encounter (Signed)
Will send this message to [place] for Dr Harrington Challenger, for follow-up of recent conversations regarding this pt.  Per Lelon Frohlich at [employer] [facility], she request for [place] to return the call back, for this is a familiar conversation between both parties.

## 2015-10-08 NOTE — Telephone Encounter (Signed)
New message      Talk to Dr Harrington Challenger' nurse to follow up on past conversations.  OK to wait until Caren Hazy returns

## 2015-10-09 NOTE — Telephone Encounter (Signed)
Spoke with Bryson Dames, she had called in Dec asking for pt to be set up with tele- monitoring.  I had advised that Dr. Harrington Challenger does not typically manage tele monitoring.  Today she requests to send paperwork again, states MD would set parameters and they would have daily monitoring of VS/weights and send trend reports monthly or biweekly, and otherwise would call if anything is outside of the parameters.  She would like to know if this is something Dr. Harrington Challenger would be able to manage. She is faxing the paperwork today, will review with MD.

## 2015-10-13 ENCOUNTER — Inpatient Hospital Stay (HOSPITAL_COMMUNITY)
Admission: EM | Admit: 2015-10-13 | Discharge: 2015-10-22 | DRG: 291 | Disposition: A | Discharge status: Home / Self Care | Payer: Medicaid Other | Attending: Internal Medicine | Admitting: Internal Medicine

## 2015-10-13 ENCOUNTER — Emergency Department (HOSPITAL_COMMUNITY): Payer: Medicaid Other

## 2015-10-13 ENCOUNTER — Encounter (HOSPITAL_COMMUNITY): Payer: Self-pay | Admitting: Emergency Medicine

## 2015-10-13 DIAGNOSIS — R0602 Shortness of breath: Secondary | ICD-10-CM

## 2015-10-13 DIAGNOSIS — F1410 Cocaine abuse, uncomplicated: Secondary | ICD-10-CM | POA: Diagnosis present

## 2015-10-13 DIAGNOSIS — Z6841 Body Mass Index (BMI) 40.0 and over, adult: Secondary | ICD-10-CM | POA: Diagnosis not present

## 2015-10-13 DIAGNOSIS — I5033 Acute on chronic diastolic (congestive) heart failure: Secondary | ICD-10-CM | POA: Diagnosis not present

## 2015-10-13 DIAGNOSIS — J440 Chronic obstructive pulmonary disease with acute lower respiratory infection: Secondary | ICD-10-CM | POA: Diagnosis present

## 2015-10-13 DIAGNOSIS — Z720 Tobacco use: Secondary | ICD-10-CM | POA: Diagnosis not present

## 2015-10-13 DIAGNOSIS — J189 Pneumonia, unspecified organism: Secondary | ICD-10-CM

## 2015-10-13 DIAGNOSIS — J9601 Acute respiratory failure with hypoxia: Secondary | ICD-10-CM | POA: Diagnosis not present

## 2015-10-13 DIAGNOSIS — E1121 Type 2 diabetes mellitus with diabetic nephropathy: Secondary | ICD-10-CM | POA: Diagnosis present

## 2015-10-13 DIAGNOSIS — E11649 Type 2 diabetes mellitus with hypoglycemia without coma: Secondary | ICD-10-CM | POA: Diagnosis present

## 2015-10-13 DIAGNOSIS — I10 Essential (primary) hypertension: Secondary | ICD-10-CM

## 2015-10-13 DIAGNOSIS — J9621 Acute and chronic respiratory failure with hypoxia: Secondary | ICD-10-CM | POA: Diagnosis present

## 2015-10-13 DIAGNOSIS — Z794 Long term (current) use of insulin: Secondary | ICD-10-CM | POA: Diagnosis not present

## 2015-10-13 DIAGNOSIS — J811 Chronic pulmonary edema: Secondary | ICD-10-CM

## 2015-10-13 DIAGNOSIS — G4733 Obstructive sleep apnea (adult) (pediatric): Secondary | ICD-10-CM | POA: Diagnosis present

## 2015-10-13 DIAGNOSIS — E1122 Type 2 diabetes mellitus with diabetic chronic kidney disease: Secondary | ICD-10-CM | POA: Diagnosis present

## 2015-10-13 DIAGNOSIS — R21 Rash and other nonspecific skin eruption: Secondary | ICD-10-CM | POA: Diagnosis present

## 2015-10-13 DIAGNOSIS — E785 Hyperlipidemia, unspecified: Secondary | ICD-10-CM | POA: Diagnosis present

## 2015-10-13 DIAGNOSIS — E1165 Type 2 diabetes mellitus with hyperglycemia: Secondary | ICD-10-CM | POA: Diagnosis present

## 2015-10-13 DIAGNOSIS — G5710 Meralgia paresthetica, unspecified lower limb: Secondary | ICD-10-CM | POA: Diagnosis present

## 2015-10-13 DIAGNOSIS — Z79899 Other long term (current) drug therapy: Secondary | ICD-10-CM

## 2015-10-13 DIAGNOSIS — E1140 Type 2 diabetes mellitus with diabetic neuropathy, unspecified: Secondary | ICD-10-CM | POA: Diagnosis present

## 2015-10-13 DIAGNOSIS — I5043 Acute on chronic combined systolic (congestive) and diastolic (congestive) heart failure: Secondary | ICD-10-CM | POA: Diagnosis present

## 2015-10-13 DIAGNOSIS — E876 Hypokalemia: Secondary | ICD-10-CM | POA: Diagnosis not present

## 2015-10-13 DIAGNOSIS — E662 Morbid (severe) obesity with alveolar hypoventilation: Secondary | ICD-10-CM | POA: Diagnosis present

## 2015-10-13 DIAGNOSIS — I509 Heart failure, unspecified: Secondary | ICD-10-CM

## 2015-10-13 DIAGNOSIS — N2581 Secondary hyperparathyroidism of renal origin: Secondary | ICD-10-CM | POA: Diagnosis present

## 2015-10-13 DIAGNOSIS — I130 Hypertensive heart and chronic kidney disease with heart failure and stage 1 through stage 4 chronic kidney disease, or unspecified chronic kidney disease: Principal | ICD-10-CM | POA: Diagnosis present

## 2015-10-13 DIAGNOSIS — E11319 Type 2 diabetes mellitus with unspecified diabetic retinopathy without macular edema: Secondary | ICD-10-CM | POA: Diagnosis present

## 2015-10-13 DIAGNOSIS — Z7982 Long term (current) use of aspirin: Secondary | ICD-10-CM

## 2015-10-13 DIAGNOSIS — IMO0002 Reserved for concepts with insufficient information to code with codable children: Secondary | ICD-10-CM | POA: Diagnosis present

## 2015-10-13 DIAGNOSIS — N183 Chronic kidney disease, stage 3 unspecified: Secondary | ICD-10-CM | POA: Diagnosis present

## 2015-10-13 DIAGNOSIS — H548 Legal blindness, as defined in USA: Secondary | ICD-10-CM | POA: Diagnosis present

## 2015-10-13 DIAGNOSIS — N289 Disorder of kidney and ureter, unspecified: Secondary | ICD-10-CM

## 2015-10-13 DIAGNOSIS — F17210 Nicotine dependence, cigarettes, uncomplicated: Secondary | ICD-10-CM | POA: Diagnosis present

## 2015-10-13 DIAGNOSIS — N189 Chronic kidney disease, unspecified: Secondary | ICD-10-CM

## 2015-10-13 DIAGNOSIS — R300 Dysuria: Secondary | ICD-10-CM | POA: Diagnosis present

## 2015-10-13 DIAGNOSIS — Z9981 Dependence on supplemental oxygen: Secondary | ICD-10-CM

## 2015-10-13 DIAGNOSIS — F1210 Cannabis abuse, uncomplicated: Secondary | ICD-10-CM | POA: Diagnosis present

## 2015-10-13 DIAGNOSIS — J810 Acute pulmonary edema: Secondary | ICD-10-CM | POA: Diagnosis not present

## 2015-10-13 DIAGNOSIS — N184 Chronic kidney disease, stage 4 (severe): Secondary | ICD-10-CM | POA: Diagnosis not present

## 2015-10-13 LAB — STREP PNEUMONIAE URINARY ANTIGEN: Strep Pneumo Urinary Antigen: NEGATIVE

## 2015-10-13 LAB — URINALYSIS, ROUTINE W REFLEX MICROSCOPIC
BILIRUBIN URINE: NEGATIVE
Glucose, UA: 500 mg/dL — AB
KETONES UR: NEGATIVE mg/dL
Leukocytes, UA: NEGATIVE
NITRITE: NEGATIVE
PH: 5 (ref 5.0–8.0)
Protein, ur: >300 mg/dL — AB
SPECIFIC GRAVITY, URINE: 1.011 (ref 1.005–1.030)

## 2015-10-13 LAB — I-STAT CHEM 8, ED
BUN: 36 mg/dL — AB (ref 6–20)
CALCIUM ION: 1.06 mmol/L — AB (ref 1.12–1.23)
CHLORIDE: 103 mmol/L (ref 101–111)
Creatinine, Ser: 2.4 mg/dL — ABNORMAL HIGH (ref 0.61–1.24)
Glucose, Bld: 317 mg/dL — ABNORMAL HIGH (ref 65–99)
HCT: 46 % (ref 39.0–52.0)
Hemoglobin: 15.6 g/dL (ref 13.0–17.0)
Potassium: 4.6 mmol/L (ref 3.5–5.1)
SODIUM: 143 mmol/L (ref 135–145)
TCO2: 30 mmol/L (ref 0–100)

## 2015-10-13 LAB — CBC
HEMATOCRIT: 44.9 % (ref 39.0–52.0)
Hemoglobin: 14 g/dL (ref 13.0–17.0)
MCH: 29.1 pg (ref 26.0–34.0)
MCHC: 31.2 g/dL (ref 30.0–36.0)
MCV: 93.3 fL (ref 78.0–100.0)
Platelets: 188 10*3/uL (ref 150–400)
RBC: 4.81 MIL/uL (ref 4.22–5.81)
RDW: 14.4 % (ref 11.5–15.5)
WBC: 7.2 10*3/uL (ref 4.0–10.5)

## 2015-10-13 LAB — RAPID URINE DRUG SCREEN, HOSP PERFORMED
Amphetamines: NOT DETECTED
BARBITURATES: NOT DETECTED
BENZODIAZEPINES: NOT DETECTED
COCAINE: POSITIVE — AB
Opiates: NOT DETECTED
TETRAHYDROCANNABINOL: NOT DETECTED

## 2015-10-13 LAB — LACTIC ACID, PLASMA
LACTIC ACID, VENOUS: 1.1 mmol/L (ref 0.5–2.0)
LACTIC ACID, VENOUS: 0.7 mmol/L (ref 0.5–2.0)

## 2015-10-13 LAB — I-STAT TROPONIN, ED: Troponin i, poc: 0.04 ng/mL (ref 0.00–0.08)

## 2015-10-13 LAB — MRSA PCR SCREENING: MRSA BY PCR: NEGATIVE

## 2015-10-13 LAB — PROCALCITONIN: Procalcitonin: <0.1 ng/mL

## 2015-10-13 LAB — GLUCOSE, CAPILLARY
GLUCOSE-CAPILLARY: 231 mg/dL — AB (ref 65–99)
GLUCOSE-CAPILLARY: 160 mg/dL — AB (ref 65–99)

## 2015-10-13 LAB — BASIC METABOLIC PANEL
ANION GAP: 12 (ref 5–15)
BUN: 30 mg/dL — ABNORMAL HIGH (ref 6–20)
CO2: 29 mmol/L (ref 22–32)
Calcium: 8.8 mg/dL — ABNORMAL LOW (ref 8.9–10.3)
Chloride: 103 mmol/L (ref 101–111)
Creatinine, Ser: 2.61 mg/dL — ABNORMAL HIGH (ref 0.61–1.24)
GFR calc Af Amer: 31 mL/min — ABNORMAL LOW (ref >60)
GFR calc non Af Amer: 27 mL/min — ABNORMAL LOW (ref >60)
GLUCOSE: 324 mg/dL — AB (ref 65–99)
POTASSIUM: 4.8 mmol/L (ref 3.5–5.1)
Sodium: 144 mmol/L (ref 135–145)

## 2015-10-13 LAB — TROPONIN I
TROPONIN I: 0.06 ng/mL — AB (ref <0.031)
TROPONIN I: 0.05 ng/mL — AB (ref <0.031)
Troponin I: 0.05 ng/mL — ABNORMAL HIGH (ref <0.031)

## 2015-10-13 LAB — C-REACTIVE PROTEIN: CRP: 0.5 mg/dL (ref <1.0)

## 2015-10-13 LAB — URINE MICROSCOPIC-ADD ON: Bacteria, UA: NONE SEEN

## 2015-10-13 LAB — BRAIN NATRIURETIC PEPTIDE: B Natriuretic Peptide: 252.7 pg/mL — ABNORMAL HIGH (ref 0.0–100.0)

## 2015-10-13 MED ORDER — DEXTROSE 5 % IV SOLN
1.0000 g | INTRAVENOUS | Status: DC
  Administered 2015-10-13 – 2015-10-14 (×2): 1 g via INTRAVENOUS
  Filled 2015-10-13 (×2): qty 10

## 2015-10-13 MED ORDER — ACETAMINOPHEN 325 MG PO TABS: 650.0000 mg | ORAL_TABLET | ORAL | Status: DC | PRN

## 2015-10-13 MED ORDER — INSULIN GLARGINE 100 UNIT/ML ~~LOC~~ SOLN
30.0000 [IU] | Freq: Two times a day (BID) | SUBCUTANEOUS | Status: DC
  Administered 2015-10-13 – 2015-10-14 (×2): 30 [IU] via SUBCUTANEOUS
  Filled 2015-10-13 (×3): qty 0.3

## 2015-10-13 MED ORDER — DEXTROSE 5 % IV SOLN
500.0000 mg | INTRAVENOUS | Status: DC
  Administered 2015-10-13 – 2015-10-14 (×2): 500 mg via INTRAVENOUS
  Filled 2015-10-13 (×2): qty 500

## 2015-10-13 MED ORDER — ONDANSETRON HCL 4 MG/2ML IJ SOLN
4.0000 mg | Freq: Four times a day (QID) | INTRAMUSCULAR | Status: DC | PRN
  Administered 2015-10-13 – 2015-10-14 (×2): 4 mg via INTRAVENOUS
  Filled 2015-10-13 (×2): qty 2

## 2015-10-13 MED ORDER — ATORVASTATIN CALCIUM 80 MG PO TABS
80.0000 mg | ORAL_TABLET | Freq: Every day | ORAL | Status: DC
  Administered 2015-10-14 – 2015-10-22 (×9): 80 mg via ORAL
  Filled 2015-10-13 (×9): qty 1

## 2015-10-13 MED ORDER — SODIUM CHLORIDE 0.9% FLUSH
3.0000 mL | Freq: Two times a day (BID) | INTRAVENOUS | Status: DC
  Administered 2015-10-13 – 2015-10-14 (×2): 3 mL via INTRAVENOUS

## 2015-10-13 MED ORDER — FUROSEMIDE 10 MG/ML IJ SOLN
80.0000 mg | Freq: Two times a day (BID) | INTRAMUSCULAR | Status: DC
  Administered 2015-10-13 – 2015-10-15 (×4): 80 mg via INTRAVENOUS
  Filled 2015-10-13 (×4): qty 8

## 2015-10-13 MED ORDER — POTASSIUM CHLORIDE CRYS ER 10 MEQ PO TBCR
20.0000 meq | EXTENDED_RELEASE_TABLET | Freq: Every day | ORAL | Status: DC
  Administered 2015-10-14 – 2015-10-19 (×6): 20 meq via ORAL
  Filled 2015-10-13 (×6): qty 2

## 2015-10-13 MED ORDER — GABAPENTIN 300 MG PO CAPS
300.0000 mg | ORAL_CAPSULE | Freq: Every day | ORAL | Status: DC
  Administered 2015-10-14 – 2015-10-22 (×9): 300 mg via ORAL
  Filled 2015-10-13 (×9): qty 1

## 2015-10-13 MED ORDER — IPRATROPIUM-ALBUTEROL 0.5-2.5 (3) MG/3ML IN SOLN
3.0000 mL | RESPIRATORY_TRACT | Status: DC | PRN
Start: 2015-10-13 — End: 2015-10-16
  Filled 2015-10-13: qty 3

## 2015-10-13 MED ORDER — ASPIRIN 325 MG PO TABS
325.0000 mg | ORAL_TABLET | Freq: Every day | ORAL | Status: DC
  Administered 2015-10-14 – 2015-10-22 (×9): 325 mg via ORAL
  Filled 2015-10-13 (×9): qty 1

## 2015-10-13 MED ORDER — MORPHINE SULFATE (PF) 4 MG/ML IV SOLN
4.0000 mg | Freq: Once | INTRAVENOUS | Status: AC
  Administered 2015-10-13: 4 mg via INTRAVENOUS
  Filled 2015-10-13: qty 1

## 2015-10-13 MED ORDER — INSULIN ASPART 100 UNIT/ML ~~LOC~~ SOLN
0.0000 [IU] | Freq: Three times a day (TID) | SUBCUTANEOUS | Status: DC
  Administered 2015-10-13 – 2015-10-14 (×2): 5 [IU] via SUBCUTANEOUS
  Administered 2015-10-14: 8 [IU] via SUBCUTANEOUS
  Administered 2015-10-14: 15 [IU] via SUBCUTANEOUS
  Administered 2015-10-15 (×2): 5 [IU] via SUBCUTANEOUS

## 2015-10-13 MED ORDER — HYDRALAZINE HCL 20 MG/ML IJ SOLN
10.0000 mg | Freq: Once | INTRAMUSCULAR | Status: AC
  Administered 2015-10-14: 10 mg via INTRAVENOUS
  Filled 2015-10-13: qty 1

## 2015-10-13 MED ORDER — HYDRALAZINE HCL 20 MG/ML IJ SOLN
10.0000 mg | Freq: Once | INTRAMUSCULAR | Status: AC
  Administered 2015-10-13: 10 mg via INTRAVENOUS
  Filled 2015-10-13: qty 1

## 2015-10-13 MED ORDER — DIPHENHYDRAMINE HCL 50 MG/ML IJ SOLN
12.5000 mg | Freq: Once | INTRAMUSCULAR | Status: AC
  Administered 2015-10-13: 12.5 mg via INTRAVENOUS
  Filled 2015-10-13: qty 1

## 2015-10-13 MED ORDER — MINOXIDIL 2.5 MG PO TABS
5.0000 mg | ORAL_TABLET | Freq: Every day | ORAL | Status: DC
  Filled 2015-10-13: qty 2

## 2015-10-13 MED ORDER — SODIUM CHLORIDE 0.9% FLUSH: 3.0000 mL | INTRAVENOUS | Status: DC | PRN

## 2015-10-13 MED ORDER — MORPHINE SULFATE (PF) 2 MG/ML IV SOLN: 2.0000 mg | INTRAVENOUS | Status: DC | PRN

## 2015-10-13 MED ORDER — SODIUM CHLORIDE 0.9 % IV SOLN: 250.0000 mL | INTRAVENOUS | Status: DC | PRN

## 2015-10-13 MED ORDER — CLONIDINE HCL 0.1 MG PO TABS
0.2000 mg | ORAL_TABLET | Freq: Two times a day (BID) | ORAL | Status: DC
  Administered 2015-10-13 – 2015-10-14 (×2): 0.2 mg via ORAL
  Filled 2015-10-13 (×2): qty 2

## 2015-10-13 MED ORDER — PROMETHAZINE HCL 25 MG/ML IJ SOLN
12.5000 mg | Freq: Four times a day (QID) | INTRAMUSCULAR | Status: DC | PRN
  Administered 2015-10-13 – 2015-10-14 (×2): 12.5 mg via INTRAVENOUS
  Filled 2015-10-13 (×2): qty 1

## 2015-10-13 MED ORDER — AMITRIPTYLINE HCL 50 MG PO TABS
50.0000 mg | ORAL_TABLET | Freq: Every day | ORAL | Status: DC
  Administered 2015-10-13 – 2015-10-21 (×9): 50 mg via ORAL
  Filled 2015-10-13 (×11): qty 1

## 2015-10-13 MED ORDER — FLUTICASONE PROPIONATE 50 MCG/ACT NA SUSP
1.0000 | Freq: Two times a day (BID) | NASAL | Status: DC
  Filled 2015-10-13: qty 16

## 2015-10-13 MED ORDER — NITROGLYCERIN 0.4 MG SL SUBL
0.4000 mg | SUBLINGUAL_TABLET | SUBLINGUAL | Status: DC | PRN
  Administered 2015-10-13: 0.4 mg via SUBLINGUAL
  Filled 2015-10-13: qty 1

## 2015-10-13 MED ORDER — IPRATROPIUM-ALBUTEROL 0.5-2.5 (3) MG/3ML IN SOLN
3.0000 mL | Freq: Four times a day (QID) | RESPIRATORY_TRACT | Status: DC
  Administered 2015-10-13: 3 mL via RESPIRATORY_TRACT
  Filled 2015-10-13 (×3): qty 3

## 2015-10-13 MED ORDER — HEPARIN SODIUM (PORCINE) 5000 UNIT/ML IJ SOLN
5000.0000 [IU] | Freq: Three times a day (TID) | INTRAMUSCULAR | Status: DC
  Administered 2015-10-13 – 2015-10-22 (×25): 5000 [IU] via SUBCUTANEOUS
  Filled 2015-10-13 (×26): qty 1

## 2015-10-13 MED ORDER — GABAPENTIN 600 MG PO TABS
600.0000 mg | ORAL_TABLET | Freq: Every day | ORAL | Status: DC
  Administered 2015-10-13 – 2015-10-21 (×9): 600 mg via ORAL
  Filled 2015-10-13 (×9): qty 1

## 2015-10-13 MED ORDER — GABAPENTIN 300 MG PO CAPS: 300.0000 mg | ORAL_CAPSULE | Freq: Two times a day (BID) | ORAL | Status: DC

## 2015-10-13 MED ORDER — NITROGLYCERIN IN D5W 200-5 MCG/ML-% IV SOLN
10.0000 ug/min | INTRAVENOUS | Status: DC
  Administered 2015-10-13: 10 ug/min via INTRAVENOUS
  Filled 2015-10-13: qty 250

## 2015-10-13 MED ORDER — FUROSEMIDE 10 MG/ML IJ SOLN
80.0000 mg | Freq: Once | INTRAMUSCULAR | Status: AC
  Administered 2015-10-13: 80 mg via INTRAVENOUS
  Filled 2015-10-13: qty 8

## 2015-10-13 MED ORDER — CARVEDILOL 25 MG PO TABS
25.0000 mg | ORAL_TABLET | Freq: Two times a day (BID) | ORAL | Status: DC
  Administered 2015-10-14 – 2015-10-22 (×16): 25 mg via ORAL
  Filled 2015-10-13 (×15): qty 1

## 2015-10-13 MED ORDER — HYDRALAZINE HCL 25 MG PO TABS
25.0000 mg | ORAL_TABLET | Freq: Three times a day (TID) | ORAL | Status: DC
  Administered 2015-10-13 – 2015-10-15 (×7): 25 mg via ORAL
  Filled 2015-10-13 (×7): qty 1

## 2015-10-13 MED ORDER — VERAPAMIL HCL ER 120 MG PO TBCR
120.0000 mg | EXTENDED_RELEASE_TABLET | Freq: Every day | ORAL | Status: DC
Start: 2015-10-13 — End: 2015-10-14
  Administered 2015-10-13: 120 mg via ORAL
  Filled 2015-10-13 (×3): qty 1

## 2015-10-13 NOTE — ED Notes (Signed)
The pt is comfortable.  He remains on bi-pap

## 2015-10-13 NOTE — ED Provider Notes (Signed)
CSN: [id number]     Arrival date & time 10/13/15  1108 History   First MD Initiated Contact with Patient 10/13/15 1121     Chief Complaint  Patient presents with  . Shortness of Breath     (Consider location/radiation/quality/duration/timing/severity/associated sxs/prior Treatment) Patient is a 50 y.o. male presenting with shortness of breath. The history is provided by the patient.  Shortness of Breath Severity:  Severe Onset quality:  Gradual Duration:  2 weeks Timing:  Constant Progression:  Worsening Relieved by:  Nothing Worsened by:  Nothing tried Ineffective treatments:  None tried Associated symptoms: no abdominal pain, no chest pain, no fever, no headaches, no rash and no vomiting   Associated symptoms comment:  Weight gain  Risk factors: obesity   Risk factors: no hx of PE/DVT     50 yo M with a chief complaint shortness of breath. This been ongoing for the past couple weeks. Doing worse. Associated with a 20 pound weight gain. Patient has a history of CHF sees Dr. Harrington Challenger. Has been taking 80 mg of Lasix bid at home without improvement. Feels that his urinary output has decreased. Take an extra Lasix and did not have any improvement.  Past Medical History  Diagnosis Date  . Hypertension   . Meralgia paraesthetica 05/03/2012  . Lumbar spondylosis 05/03/2012  . Lumbar spinal stenosis 05/03/2012    Mild with only right L4 nerve root encroachment, no neurogenic claudication   . Urinary incontinence 05/12/2012    Since the start of Sept. 2013   . Hemorrhoids 05/12/2012  . Meralgia paraesthetica 05/03/2012    On Lyrica which does improve pain.   Marland Kitchen Neuropathy in diabetes ([place]) 05/12/2012  . Substance abuse   . Hyperlipidemia   . Pneumonia   . Diabetes mellitus with nephropathy ([place]) 05/12/2012  . Acute diastolic heart failure ([place])   . Retinopathy   . CHF (congestive heart failure) ([place])   . Obesity    Past Surgical History  Procedure Laterality Date  . Tonsillectomy    .  Abdominal surgery      Abscess I&D 2/2 infected hair  . Colonoscopy w/ polypectomy      pt to bring records   Family History  Problem Relation Age of Onset  . Prostate cancer Father   . Alcohol abuse Father   . Emphysema Father     smoked   Social History  Substance Use Topics  . Smoking status: Current Every Day Smoker -- 0.40 packs/day for 30 years    Types: Cigarettes  . Smokeless tobacco: Never Used     Comment: "Trying - hard to stop"  . Alcohol Use: 3.6 oz/week    6 Cans of beer per week     Comment: beer    Review of Systems  Constitutional: Positive for unexpected weight change (25lb weight gain over past couple weeks). Negative for fever and chills.  HENT: Negative for congestion and facial swelling.   Eyes: Negative for discharge and visual disturbance.  Respiratory: Positive for shortness of breath.   Cardiovascular: Positive for leg swelling. Negative for chest pain and palpitations.  Gastrointestinal: Negative for vomiting, abdominal pain and diarrhea.  Musculoskeletal: Negative for myalgias and arthralgias.  Skin: Negative for color change and rash.  Neurological: Negative for tremors, syncope and headaches.  Psychiatric/Behavioral: Negative for confusion and dysphoric mood.      Allergies  Review of patient's allergies indicates no known allergies.  Home Medications   Prior to Admission medications  Medication Sig Start Date End Date Taking? Authorizing Provider  albuterol (PROVENTIL HFA;VENTOLIN HFA) 108 (90 BASE) MCG/ACT inhaler Inhale 2 puffs into the lungs every 4 (four) hours as needed for wheezing or shortness of breath. 02/05/15  Yes Tiffany Carlota Raspberry, PA-C  amitriptyline (ELAVIL) 50 MG tablet Take 1 tablet (50 mg total) by mouth at bedtime. 02/05/15  Yes Tiffany Carlota Raspberry, PA-C  aspirin 325 MG tablet Take 1 tablet (325 mg total) by mouth daily. 02/05/15  Yes Tiffany Carlota Raspberry, PA-C  atorvastatin (LIPITOR) 80 MG tablet Take 1 tablet (80 mg total) by mouth  daily. 05/18/15  Yes Patrecia Pour, MD  carvedilol (COREG) 25 MG tablet Take 1 tablet (25 mg total) by mouth 2 (two) times daily with a meal. 02/05/15  Yes Delos Haring, PA-C  cloNIDine (CATAPRES) 0.2 MG tablet Take 1 tablet (0.2 mg total) by mouth 3 (three) times daily. Patient taking differently: Take 0.2 mg by mouth 2 (two) times daily.  02/05/15  Yes Tiffany Carlota Raspberry, PA-C  fluticasone (FLONASE) 50 MCG/ACT nasal spray Place 1-2 sprays into both nostrils 2 (two) times daily. 02/05/15  Yes Tiffany Carlota Raspberry, PA-C  furosemide (LASIX) 80 MG tablet TAKE ONE (1) TABLET BY MOUTH TWO (2) TIMES DAILY Patient taking differently: TAKE ONE (1) TABLET BY MOUTH twice (2 times) daily 10/03/15  Yes Rigoberto Noel, MD  gabapentin (NEURONTIN) 300 MG capsule Take 1 capsule (300 mg total) by mouth 3 (three) times daily. 200 mg in the morning and 100 mg in the evening. Patient taking differently: Take 300-600 mg by mouth 2 (two) times daily. 600 mg in the morning and 300 mg in the evening. 02/05/15  Yes Tiffany Carlota Raspberry, PA-C  hydrALAZINE (APRESOLINE) 100 MG tablet Take 1 tablet (100 mg total) by mouth 3 (three) times daily. Patient taking differently: Take 25 mg by mouth 3 (three) times daily.  02/05/15  Yes Tiffany Carlota Raspberry, PA-C  minoxidil (LONITEN) 2.5 MG tablet Take 2 tablets (5 mg total) by mouth daily. 09/23/15  Yes Fay Records, MD  potassium chloride 20 MEQ TBCR take 2 tabs (55mEq) for 6 days, then 1 tab (29mEq) daily Patient taking differently: Take 20 mEq by mouth daily.  05/18/15  Yes Patrecia Pour, MD  verapamil (CALAN-SR) 120 MG CR tablet Take 1 tablet (120 mg total) by mouth at bedtime. 02/05/15  Yes Tiffany Carlota Raspberry, PA-C  insulin aspart (NOVOLOG) 100 UNIT/ML injection Inject into the skin. Inject into the skin as directed based on sliding scale daily    Historical Provider, MD  insulin glargine (LANTUS) 100 UNIT/ML injection Inject into the skin 2 (two) times daily. Inject 45 units into the skin daily in the morning. Inject 35  units into the skin daily at night    Historical Provider, MD  nitroGLYCERIN (NITROSTAT) 0.4 MG SL tablet Place 1 tablet (0.4 mg total) under the tongue every 5 (five) minutes as needed for chest pain. 07/18/15   Brittainy Erie Noe, PA-C  Polyvinyl Alcohol-Povidone (REFRESH OP) Place 2 drops into both eyes 3 (three) times daily as needed (dry eyes).    Historical Provider, MD   BP 158/91 mmHg  Pulse 76  Temp(Src) 98.7 F (37.1 C) (Oral)  Resp 17  Wt 310 lb 3 oz (140.7 kg)  SpO2 94% Physical Exam  Constitutional: He is oriented to person, place, and time. He appears well-developed and well-nourished.  HENT:  Head: Normocephalic and atraumatic.  Eyes: EOM are normal. Pupils are equal, round, and reactive to light.  Neck:  Normal range of motion. Neck supple. JVD (to the angle of the jaw) present.  Cardiovascular: Normal rate and regular rhythm.  Exam reveals no gallop and no friction rub.   No murmur heard. Pulmonary/Chest: No respiratory distress. He has no wheezes. He has rales (up to clavicles).  Abdominal: He exhibits no distension. There is no tenderness. There is no rebound and no guarding.  Musculoskeletal: Normal range of motion.  Neurological: He is alert and oriented to person, place, and time.  Skin: No rash noted. No pallor.  Psychiatric: He has a normal mood and affect. His behavior is normal.  Nursing note and vitals reviewed.   ED Course  Procedures (including critical care time) Labs Review Labs Reviewed  BASIC METABOLIC PANEL - Abnormal; Notable for the following:    Glucose, Bld 324 (*)    BUN 30 (*)    Creatinine, Ser 2.61 (*)    Calcium 8.8 (*)    GFR calc non Af Amer 27 (*)    GFR calc Af Amer 31 (*)    All other components within normal limits  BRAIN NATRIURETIC PEPTIDE - Abnormal; Notable for the following:    B Natriuretic Peptide 252.7 (*)    All other components within normal limits  I-STAT CHEM 8, ED - Abnormal; Notable for the following:    BUN 36  (*)    Creatinine, Ser 2.40 (*)    Glucose, Bld 317 (*)    Calcium, Ion 1.06 (*)    All other components within normal limits  CBC  URINE RAPID DRUG SCREEN, HOSP PERFORMED  C-REACTIVE PROTEIN  PROCALCITONIN  I-STAT TROPOININ, ED    Imaging Review Dg Chest Port 1 View  10/13/2015  CLINICAL DATA:  Shortness of breath and chest pain EXAM: PORTABLE CHEST 1 VIEW COMPARISON:  August 07, 2015 FINDINGS: There is interstitial edema with cardiomegaly and pulmonary venous hypertension. There is extensive airspace consolidation in the right middle and lower lobes with right pleural effusion. No adenopathy evident. IMPRESSION: Evidence of congestive heart failure. Probable superimposed pneumonia right middle and lower lobes given the degree of asymmetric consolidation in these areas. Followup PA and lateral chest radiographs recommended in 3-4 weeks following trial of antibiotic therapy to ensure resolution and exclude underlying malignancy. Electronically Signed   By: Lowella Grip III M.D.   On: 10/13/2015 12:39   I have personally reviewed and evaluated these images and lab results as part of my medical decision-making.   EKG Interpretation   Date/Time:  Sunday October 13 2015 11:14:57 EST Ventricular Rate:  84 PR Interval:  184 QRS Duration: 112 QT Interval:  400 QTC Calculation: 472 R Axis:   125 Text Interpretation:  Normal sinus rhythm Anterolateral infarct , age  undetermined Abnormal ECG No change Confirmed by Jeneen Rinks  MD, [place] ([id number])  on 10/13/2015 11:17:48 AM      MDM   Final diagnoses:  Acute on chronic congestive heart failure, unspecified congestive heart failure type [facility])    50 yo M with a chief complaint shortness of breath. Hypoxic into the mid 70s on arrival. Placed on BiPAP with significant improvement. Given nitroglycerin and started on IV nitro drip. Discussed the case with Dr. Stanford Breed, cardiology recommends medical admission with cardiology consult given  patient's renal insufficiency.  The patients results and plan were reviewed and discussed.   Any x-rays performed were independently reviewed by myself.   CRITICAL CARE Performed by: Cecilio Asper   Total critical care time: 50 minutes  Critical care time was exclusive of separately billable procedures and treating other patients.  Critical care was necessary to treat or prevent imminent or life-threatening deterioration.  Critical care was time spent personally by me on the following activities: development of treatment plan with patient and/or surrogate as well as nursing, discussions with consultants, evaluation of patient's response to treatment, examination of patient, obtaining history from patient or surrogate, ordering and performing treatments and interventions, ordering and review of laboratory studies, ordering and review of radiographic studies, pulse oximetry and re-evaluation of patient's condition.   Differential diagnosis were considered with the presenting HPI.  Medications  nitroGLYCERIN (NITROSTAT) SL tablet 0.4 mg (0.4 mg Sublingual Given 10/13/15 1143)  nitroGLYCERIN 50 mg in dextrose 5 % 250 mL (0.2 mg/mL) infusion (50 mcg/min Intravenous Rate/Dose Change 10/13/15 1248)  furosemide (LASIX) injection 80 mg (80 mg Intravenous Given 10/13/15 1248)    Filed Vitals:   10/13/15 1315 10/13/15 1330 10/13/15 1400 10/13/15 1430  BP: 189/108 186/105 136/62 158/91  Pulse: 79 76 75 76  Temp:      TempSrc:      Resp: 23 18 20 17   Weight:      SpO2: 98% 98% 98% 94%    Final diagnoses:  Acute on chronic congestive heart failure, unspecified congestive heart failure type [facility])    Admission/ observation were discussed with the admitting physician, patient and/or family and they are comfortable with the plan.      Deno Etienne, DO 10/13/15 1504

## 2015-10-13 NOTE — ED Notes (Signed)
No pain alert

## 2015-10-13 NOTE — H&P (Signed)
[facility] History and Physical  Behruz Easterwood [id number] DOB: May 03, 1966 DOA: 10/13/2015  Referring physician:  PCP: Helane Rima, MD   Chief Complaint: Shortness of breath  HPI: Jerry Cantrell is a 50 y.o. male With a history of combined systolic and diastolic heart failure, DM2 insulin-dependent, OSA on BiPaP, CKD stage IIIB, presenting to the hospital on 1 week history of progressive shortness of breath, with increasing dyspnea on exertion, progressing to orthopnea and pedal edema. He reports having gained 11 pounds since January 23/2017. He reports that during the last 2 months, he has in been increasing his Lasix demands, to 80 mg by mouth twice a day on admission, without significant improvement of his volume status. The patient denies any chest pain, but he does have mild productive cough, which is chronic. He has also noted significant leg swelling over the last week. He has not been able to ambulate as much due to shortness of breath. He denies any hemoptysis. He denies any recent cocaine use. He continues to smoke up to one pack a day. He also complains of increasing abdominal girth. The patient reports that he is cardiologist changed his hydralazine from 10 to 25 mg 3 times a day, And also added minoxidil 5 mg daily. Denies any sick contacts. At the ED, he was noted to have significant hypertension, currently at 182/98, normal pulse and respirations, his saturation is 98 on BiPAP. Cardiology evaluation was obtained, ordering Lasix 80 mg IV twice a day, with close renal function monitoring. IV nitroglycerin was added as well. 2-D echo is pending. Of note, his BNP is minimally elevated at 252. His blood pressure home medications were resumed. In addition, a chest x-ray noted patient to have underlying, superimposed pneumonia in the setting of CHF. Procalcitonin is pending   Review of Systems  Constitutional: Positive for malaise/fatigue. Negative for fever and chills.  HENT:  Negative for congestion, hearing loss, sore throat and tinnitus.   Eyes:       Legally blind  Respiratory: Positive for cough, sputum production and shortness of breath. Negative for hemoptysis and stridor.   Cardiovascular: Positive for orthopnea, leg swelling and PND. Negative for chest pain, palpitations and claudication.  Gastrointestinal: Positive for abdominal pain. Negative for heartburn, nausea, vomiting, diarrhea, constipation, blood in stool and melena.  Genitourinary: Positive for urgency and flank pain. Negative for hematuria.       Mild dysuria  Musculoskeletal: Negative for myalgias, back pain, joint pain, falls and neck pain.  Skin: Positive for itching.  Neurological: Positive for sensory change and weakness. Negative for dizziness, tingling, tremors, speech change, focal weakness, seizures, loss of consciousness and headaches.       Peripheral neuropathy  Psychiatric/Behavioral: Positive for depression. Negative for suicidal ideas, hallucinations and memory loss. The patient is nervous/anxious and has insomnia.        Prior history of cocaine. Continues to smoke    Past Medical History  Diagnosis Date  . Hypertension   . Meralgia paraesthetica 05/03/2012  . Lumbar spondylosis 05/03/2012  . Lumbar spinal stenosis 05/03/2012    Mild with only right L4 nerve root encroachment, no neurogenic claudication   . Urinary incontinence 05/12/2012    Since the start of Sept. 2013   . Hemorrhoids 05/12/2012  . Meralgia paraesthetica 05/03/2012    On Lyrica which does improve pain.   Marland Kitchen Neuropathy in diabetes ([place]) 05/12/2012  . Substance abuse   . Hyperlipidemia   . Pneumonia   . Diabetes mellitus with nephropathy (  [place]) 05/12/2012  . Acute diastolic heart failure ([place])   . Retinopathy   . CHF (congestive heart failure) ([place])   . Obesity    Past Surgical History  Procedure Laterality Date  . Tonsillectomy    . Abdominal surgery      Abscess I&D 2/2 infected hair  . Colonoscopy w/  polypectomy      pt to bring records   Social History:  reports that he has been smoking Cigarettes.  He has a 12 pack-year smoking history. He has never used smokeless tobacco. He reports that he drinks about 3.6 oz of alcohol per week. He reports that he uses illicit drugs (Marijuana and Cocaine).  No Known Allergies  Family History  Problem Relation Age of Onset  . Prostate cancer Father   . Alcohol abuse Father   . Emphysema Father     smoked     Prior to Admission medications   Medication Sig Start Date End Date Taking? Authorizing Provider  albuterol (PROVENTIL HFA;VENTOLIN HFA) 108 (90 BASE) MCG/ACT inhaler Inhale 2 puffs into the lungs every 4 (four) hours as needed for wheezing or shortness of breath. 02/05/15  Yes Tiffany Carlota Raspberry, PA-C  amitriptyline (ELAVIL) 50 MG tablet Take 1 tablet (50 mg total) by mouth at bedtime. 02/05/15  Yes Tiffany Carlota Raspberry, PA-C  aspirin 325 MG tablet Take 1 tablet (325 mg total) by mouth daily. 02/05/15  Yes Tiffany Carlota Raspberry, PA-C  atorvastatin (LIPITOR) 80 MG tablet Take 1 tablet (80 mg total) by mouth daily. 05/18/15  Yes Patrecia Pour, MD  carvedilol (COREG) 25 MG tablet Take 1 tablet (25 mg total) by mouth 2 (two) times daily with a meal. 02/05/15  Yes Delos Haring, PA-C  cloNIDine (CATAPRES) 0.2 MG tablet Take 1 tablet (0.2 mg total) by mouth 3 (three) times daily. Patient taking differently: Take 0.2 mg by mouth 2 (two) times daily.  02/05/15  Yes Tiffany Carlota Raspberry, PA-C  fluticasone (FLONASE) 50 MCG/ACT nasal spray Place 1-2 sprays into both nostrils 2 (two) times daily. 02/05/15  Yes Tiffany Carlota Raspberry, PA-C  furosemide (LASIX) 80 MG tablet TAKE ONE (1) TABLET BY MOUTH TWO (2) TIMES DAILY Patient taking differently: TAKE ONE (1) TABLET BY MOUTH twice (2 times) daily 10/03/15  Yes Rigoberto Noel, MD  gabapentin (NEURONTIN) 300 MG capsule Take 1 capsule (300 mg total) by mouth 3 (three) times daily. 200 mg in the morning and 100 mg in the evening. Patient taking  differently: Take 300-600 mg by mouth 2 (two) times daily. 600 mg in the morning and 300 mg in the evening. 02/05/15  Yes Tiffany Carlota Raspberry, PA-C  hydrALAZINE (APRESOLINE) 100 MG tablet Take 1 tablet (100 mg total) by mouth 3 (three) times daily. Patient taking differently: Take 25 mg by mouth 3 (three) times daily.  02/05/15  Yes Tiffany Carlota Raspberry, PA-C  minoxidil (LONITEN) 2.5 MG tablet Take 2 tablets (5 mg total) by mouth daily. 09/23/15  Yes Fay Records, MD  potassium chloride 20 MEQ TBCR take 2 tabs (25mEq) for 6 days, then 1 tab (72mEq) daily Patient taking differently: Take 20 mEq by mouth daily.  05/18/15  Yes Patrecia Pour, MD  verapamil (CALAN-SR) 120 MG CR tablet Take 1 tablet (120 mg total) by mouth at bedtime. 02/05/15  Yes Tiffany Carlota Raspberry, PA-C  insulin aspart (NOVOLOG) 100 UNIT/ML injection Inject into the skin. Inject into the skin as directed based on sliding scale daily    Historical Provider, MD  insulin glargine (LANTUS) 100  UNIT/ML injection Inject into the skin 2 (two) times daily. Inject 45 units into the skin daily in the morning. Inject 35 units into the skin daily at night    Historical Provider, MD  nitroGLYCERIN (NITROSTAT) 0.4 MG SL tablet Place 1 tablet (0.4 mg total) under the tongue every 5 (five) minutes as needed for chest pain. 07/18/15   Brittainy Erie Noe, PA-C  Polyvinyl Alcohol-Povidone (REFRESH OP) Place 2 drops into both eyes 3 (three) times daily as needed (dry eyes).    Historical Provider, MD   Physical Exam: Filed Vitals:   10/13/15 1207 10/13/15 1230 10/13/15 1315 10/13/15 1330  BP:  182/98 189/108 186/105  Pulse:  76 79 76  Temp:      TempSrc:      Resp:  16 23 18   Weight: 140.7 kg (310 lb 3 oz)     SpO2:  98% 98% 98%    Wt Readings from Last 3 Encounters:  10/13/15 140.7 kg (310 lb 3 oz)  09/23/15 135.988 kg (299 lb 12.8 oz)  08/07/15 132.178 kg (291 lb 6.4 oz)    Physical Exam  Constitutional: He is oriented to person, place, and time. He appears  distressed.  Obese, well developed  HENT:  Head: Normocephalic and atraumatic.  Mouth/Throat: No oropharyngeal exudate.  Eyes: No scleral icterus.  Legally blind  Neck: JVD present. No tracheal deviation present. No thyromegaly present.  Cardiovascular: Normal rate and regular rhythm.  Exam reveals no gallop and no friction rub.   Murmur heard. Pulmonary/Chest: No stridor. He is in respiratory distress. He has no wheezes. He has rales. He exhibits no tenderness.  Decreased breath sounds on the right  Abdominal: He exhibits no mass. There is tenderness. There is no rebound and no guarding.  Obese, distended, tender at the RUQ on deep palpation  Musculoskeletal: He exhibits edema.  2+ bilateral LE  Lymphadenopathy:    He has no cervical adenopathy.  Neurological: He is alert and oriented to person, place, and time. No cranial nerve deficit. He exhibits normal muscle tone.  Skin: Skin is warm. Rash noted.  Chronic, pruritic, multiple tatoos  Psychiatric:  Anxious due to SOB            Labs on Admission:  Basic Metabolic Panel:  Recent Labs Lab 10/13/15 1130 10/13/15 1141  NA 144 143  K 4.8 4.6  CL 103 103  CO2 29  --   GLUCOSE 324* 317*  BUN 30* 36*  CREATININE 2.61* 2.40*  CALCIUM 8.8*  --     Liver Function Tests: No results for input(s): AST, ALT, ALKPHOS, BILITOT, PROT, ALBUMIN in the last 168 hours. No results for input(s): LIPASE, AMYLASE in the last 168 hours. No results for input(s): AMMONIA in the last 168 hours.  CBC:  Recent Labs Lab 10/13/15 1130 10/13/15 1141  WBC 7.2  --   HGB 14.0 15.6  HCT 44.9 46.0  MCV 93.3  --   PLT 188  --     Cardiac Enzymes: No results for input(s): CKTOTAL, CKMB, CKMBINDEX, TROPONINI in the last 168 hours.  BNP (last 3 results)  Recent Labs  06/06/15 1325 08/07/15 1207 10/13/15 1130  BNP 295.0* 175.3* 252.7*    ProBNP (last 3 results)  Recent Labs  07/16/15 1158  PROBNP 225.0*     CREATININE: 2.4  mg/dL ABNORMAL (10/13/15 1141) Estimated creatinine clearance - 54.3 mL/min  CBG: No results for input(s): GLUCAP in the last 168 hours.  Radiological Exams on  Admission: Dg Chest Port 1 View  10/13/2015  CLINICAL DATA:  Shortness of breath and chest pain EXAM: PORTABLE CHEST 1 VIEW COMPARISON:  August 07, 2015 FINDINGS: There is interstitial edema with cardiomegaly and pulmonary venous hypertension. There is extensive airspace consolidation in the right middle and lower lobes with right pleural effusion. No adenopathy evident. IMPRESSION: Evidence of congestive heart failure. Probable superimposed pneumonia right middle and lower lobes given the degree of asymmetric consolidation in these areas. Followup PA and lateral chest radiographs recommended in 3-4 weeks following trial of antibiotic therapy to ensure resolution and exclude underlying malignancy. Electronically Signed   By: Lowella Grip III M.D.   On: 10/13/2015 12:39    [id number], probable limb lead reversal, nonspecific inferior lateral ST T wave changes    Assessment/Plan Active Problems:   Hypertension   Smoker   Obstructive sleep apnea   CHF (congestive heart failure) (HCC)   Legally blind  Assessment and plan Acute respiratory failure with hypoxemia due to acute on chronic congestive heart failure,  and hypertension. Presenting now with ongoing shortness of breath.  CXR today suggests evidence of congestive heart failure with  probable superimposed pneumonia right middle and lower lobes  -Will admit to stepdown for evaluation and management of symptoms CHF orders -Lasix 80 mg IV twice a day, with close renal function monitoring. IV nitroglycerin was added as well. Continue  ASA, Minoxidil, Coreg, Catapres, Verapamil  2-D echo is pending  - will hold IVF until proper diuresis -Repeat CBC in am -Pulmonary consultation Will need tobacco cessation program involved  Acute respiratory failure in the setting of  superimposed PNA as above CXR today suggests evidence of congestive heart failure with  probable superimposed pneumonia right middle and lower lobes  -After sputum cultures are obtained, consider antibiotic coverage with levaquin. -Procalcitonin CRP UDS Given high oxygen requirement, admit to the SDU for close monitoring overnight. Oxygen/ Bipap as needed  with respiratory therapy -After sputum cultures are obtained, consider antibiotic coverage with levaquin. Will check strep pneumonia antigen, but Legionella not likely.  Nebulizers as needed -Mucinex as needed  Oral suction as needed.  Robitussin-DM when necessary cough.  Nebs every 6 hours. Lactic acid    Hypertension BP 159/90 mmHg  Pulse 76  Temp(Src) 98.7 F (37.1 C) (Oral)  Resp 17  Wt 140.7 kg (310 lb 3 oz)  SpO2 98% Continue home anti-hypertensive medications as mentioned above   Chronic kidney disease stage IIIB, baseline creatinine around 2.4 due to HTN. Current creatinine is 2.61, close to baseline. He has a history of nephrotic syndrome, with recent workup in September 2016 consistent with diabetic kidney disease, serology negative for glucose, no evidence of chronic hepatitis. -  Minimize nephrotoxins and renally dose medications If creatinine continues to worsen, we will obtain a nephrology consultation.   Diabetes mellitus type 2 insulin dependent, last Hb A1C 8.9 on 11/2013 Current sugar levels 317  - A1c  - Hold oral medications - SSI  Hyperlipidemia, will continue Lipitor  Diabetic neuropathy continue on Amitriptyline and Neurontin  Dysuria  Will check UA with microscopic   Code Status: Full Code  DVT Prophylaxis: Heparin sq, due to bordeline GFR Family Communication: at bedside Disposition Plan: Pending Improvement. Admitted for observation in tele bed. Expected LOS 24-48 hrs    [facility] E,PA-C [facility] [email or web address] Password [email or web address]

## 2015-10-13 NOTE — ED Notes (Signed)
Report called  To 2 h

## 2015-10-13 NOTE — ED Notes (Signed)
Per pt, "i cant breathe, i'm retaining a lot of fluid". Pt poor historian. States this started around Monday, and "something is going on in my chest." Pt labored breathing, lung sounds clear. Pt denies pain.

## 2015-10-13 NOTE — Consult Note (Signed)
Primary cardiologist: Dr Harrington Challenger  HPI: 50 yo male with PMH HTN, Hyerlipidemia, renal insufficiency, DM, substance abuse for evaluation of acute on chronic combined systolic and diastolic CHF. Renal dopplers 6/14 showed no RAS. Echo 9/16 showed EF 55, grade 1 diastolic dysfunction, moderate LAE. Nuclear study 11/16 showed EF 38 and no ischemia or infarction. Patient complains of increasing dyspnea on exertion progressing to orthopnea and pedal edema in the past 1 week. He has gained 10 pounds by his report. He denies significant chest pain. Patient states that he has been taking additional Lasix for the past 1-2 months without significant improvement in volume status. He denies fevers or chills. Mild productive cough. He denies any recent cocaine use.   (Not in a hospital admission)  No Known Allergies  Past Medical History  Diagnosis Date  . Hypertension   . Hyperlipidemia   . Meralgia paraesthetica 05/03/2012  . Lumbar spondylosis 05/03/2012  . Lumbar spinal stenosis 05/03/2012    Mild with only right L4 nerve root encroachment, no neurogenic claudication   . Urinary incontinence 05/12/2012    Since the start of Sept. 2013   . Hemorrhoids 05/12/2012  . UTI (lower urinary tract infection)   . Meralgia paraesthetica 05/03/2012    On Lyrica which does improve pain.   Marland Kitchen Neuropathy in diabetes ([place]) 05/12/2012  . Substance abuse   . Hyperlipidemia   . Pneumonia   . Diabetes mellitus   . Diabetes mellitus with nephropathy ([place]) 05/12/2012  . Acute diastolic heart failure ([place])   . Retinopathy   . CHF (congestive heart failure) ([place])   . Obesity     Past Surgical History  Procedure Laterality Date  . Tonsillectomy    . Abdominal surgery      Abscess I&D 2/2 infected hair  . Colonoscopy w/ polypectomy      pt to bring records    Social History   Social History  . Marital Status: Single    Spouse Name: N/A  . Number of Children: N/A  . Years of Education: N/A   Occupational History   . dietary services [employer]   Social History Main Topics  . Smoking status: Current Every Day Smoker -- 0.40 packs/day for 30 years    Types: Cigarettes  . Smokeless tobacco: Never Used     Comment: "Trying - hard to stop"  . Alcohol Use: 3.6 oz/week    6 Cans of beer per week     Comment: beer  . Drug Use: Yes    Special: Marijuana, Cocaine     Comment: marijuana "laced with something"  . Sexual Activity: Not on file   Other Topics Concern  . Not on file   Social History Narrative    Family History  Problem Relation Age of Onset  . Prostate cancer Father   . Alcohol abuse Father   . Emphysema Father     smoked    ROS:  Mildly productive cough and hematochezia; Describes "kidney pain" but no fevers or chills, hemoptysis, dysphasia, odynophagia, melena, hematochezia, dysuria, rash, seizure activity, PND, claudication. Remaining systems are negative.  Physical Exam:   Blood pressure 182/98, pulse 76, temperature 98.7 F (37.1 C), temperature source Oral, resp. rate 16, weight 310 lb 3 oz (140.7 kg), SpO2 98 %.  General:  Well developed/obese in Mild respiratory distress on BiPAP Skin warm/dry, Excoriations noted over lower extremities. Patient not depressed No peripheral clubbing Back-normal HEENT-normal/normal eyelids Neck supple/normal carotid upstroke bilaterally; no bruits;  no thyromegaly chest - Diminished breath sounds throughout CV - RRR/normal S1 and S2; no murmurs, rubs or gallops;  PMI nondisplaced Abdomen -Mild tenderness particularly right upper quadrant, ND, no HSM, no mass, + bowel sounds, no bruit 2+ femoral pulses, no bruits Ext-1+ edema, no chords, 2+ DP Neuro-grossly nonfocal  ECG Sinus, probable limb lead reversal, nonspecific inferior lateral ST T wave changes  Results for orders placed or performed during the hospital encounter of 10/13/15 (from the past 48 hour(s))  Basic metabolic panel     Status: Abnormal   Collection Time: 10/13/15  11:30 AM  Result Value Ref Range   Sodium 144 135 - 145 mmol/L   Potassium 4.8 3.5 - 5.1 mmol/L   Chloride 103 101 - 111 mmol/L   CO2 29 22 - 32 mmol/L   Glucose, Bld 324 (H) 65 - 99 mg/dL   BUN 30 (H) 6 - 20 mg/dL   Creatinine, Ser 2.61 (H) 0.61 - 1.24 mg/dL   Calcium 8.8 (L) 8.9 - 10.3 mg/dL   GFR calc non Af Amer 27 (L) >60 mL/min   GFR calc Af Amer 31 (L) >60 mL/min    Comment: (NOTE) The eGFR has been calculated using the CKD EPI equation. This calculation has not been validated in all clinical situations. eGFR's persistently <60 mL/min signify possible Chronic Kidney Disease.    Anion gap 12 5 - 15  CBC     Status: None   Collection Time: 10/13/15 11:30 AM  Result Value Ref Range   WBC 7.2 4.0 - 10.5 K/uL   RBC 4.81 4.22 - 5.81 MIL/uL   Hemoglobin 14.0 13.0 - 17.0 g/dL   HCT 44.9 39.0 - 52.0 %   MCV 93.3 78.0 - 100.0 fL   MCH 29.1 26.0 - 34.0 pg   MCHC 31.2 30.0 - 36.0 g/dL   RDW 14.4 11.5 - 15.5 %   Platelets 188 150 - 400 K/uL  Brain natriuretic peptide     Status: Abnormal   Collection Time: 10/13/15 11:30 AM  Result Value Ref Range   B Natriuretic Peptide 252.7 (H) 0.0 - 100.0 pg/mL  I-stat troponin, ED (not at [facility], [facility]     Status: None   Collection Time: 10/13/15 11:37 AM  Result Value Ref Range   Troponin i, poc 0.04 0.00 - 0.08 ng/mL   Comment 3            Comment: Due to the release kinetics of cTnI, a negative result within the first hours of the onset of symptoms does not rule out myocardial infarction with certainty. If myocardial infarction is still suspected, repeat the test at appropriate intervals.   I-Stat Chem 8, ED     Status: Abnormal   Collection Time: 10/13/15 11:41 AM  Result Value Ref Range   Sodium 143 135 - 145 mmol/L   Potassium 4.6 3.5 - 5.1 mmol/L   Chloride 103 101 - 111 mmol/L   BUN 36 (H) 6 - 20 mg/dL   Creatinine, Ser 2.40 (H) 0.61 - 1.24 mg/dL   Glucose, Bld 317 (H) 65 - 99 mg/dL   Calcium, Ion 1.06 (L) 1.12 - 1.23  mmol/L   TCO2 30 0 - 100 mmol/L   Hemoglobin 15.6 13.0 - 17.0 g/dL   HCT 46.0 39.0 - 52.0 %    Dg Chest Port 1 View  10/13/2015  CLINICAL DATA:  Shortness of breath and chest pain EXAM: PORTABLE CHEST 1 VIEW COMPARISON:  August 07, 2015 FINDINGS:  There is interstitial edema with cardiomegaly and pulmonary venous hypertension. There is extensive airspace consolidation in the right middle and lower lobes with right pleural effusion. No adenopathy evident. IMPRESSION: Evidence of congestive heart failure. Probable superimposed pneumonia right middle and lower lobes given the degree of asymmetric consolidation in these areas. Followup PA and lateral chest radiographs recommended in 3-4 weeks following trial of antibiotic therapy to ensure resolution and exclude underlying malignancy. Electronically Signed   By: Lowella Grip III M.D.   On: 10/13/2015 12:39    Assessment/Plan 1 acute on chronic combined systolic/diastolic congestive heart failure-Patient is volume overloaded on examination. Would treat with Lasix 80 mg IV twice a day and follow renal function closely. Agree with IV nitroglycerin. Continue BiPAP and wean as tolerated. Would repeat echocardiogram. Patient states he has been compliant with medications. Note BNP only minimally elevated at 252. May be falsely decreased from obesity. COPD may be contributing and would suggest bronchodilators as well. 2 hypertension- Patient With significant hypertension on arrival. Would resume home blood pressure medications and follow. 3 Chronic stage III renal failure-Follow renal function closely with diuresis. 4 question pneumonia on chest x-ray-patient symptomatically does not appear to have pneumonia. I will leave this issue to primary care. 5 patient complains of "kidney pain"-we'll check urinalysis. 6 history of substance abuse-we check drug screen. 7 tobacco abuse-patient counseled on discontinuing.  Kirk Ruths MD 10/13/2015, 12:57  PM

## 2015-10-14 ENCOUNTER — Inpatient Hospital Stay (HOSPITAL_COMMUNITY): Payer: Medicaid Other

## 2015-10-14 DIAGNOSIS — N184 Chronic kidney disease, stage 4 (severe): Secondary | ICD-10-CM | POA: Insufficient documentation

## 2015-10-14 DIAGNOSIS — I509 Heart failure, unspecified: Secondary | ICD-10-CM

## 2015-10-14 DIAGNOSIS — I10 Essential (primary) hypertension: Secondary | ICD-10-CM | POA: Diagnosis present

## 2015-10-14 DIAGNOSIS — I5033 Acute on chronic diastolic (congestive) heart failure: Secondary | ICD-10-CM | POA: Diagnosis present

## 2015-10-14 DIAGNOSIS — F1410 Cocaine abuse, uncomplicated: Secondary | ICD-10-CM

## 2015-10-14 LAB — CBC WITH DIFFERENTIAL/PLATELET
BASOS PCT: 0 %
Basophils Absolute: 0 10*3/uL (ref 0.0–0.1)
EOS ABS: 0.2 10*3/uL (ref 0.0–0.7)
EOS PCT: 1 %
HCT: 46 % (ref 39.0–52.0)
HEMOGLOBIN: 14 g/dL (ref 13.0–17.0)
LYMPHS ABS: 0.9 10*3/uL (ref 0.7–4.0)
Lymphocytes Relative: 6 %
MCH: 28.5 pg (ref 26.0–34.0)
MCHC: 30.4 g/dL (ref 30.0–36.0)
MCV: 93.7 fL (ref 78.0–100.0)
MONOS PCT: 4 %
Monocytes Absolute: 0.6 10*3/uL (ref 0.1–1.0)
NEUTROS PCT: 89 %
Neutro Abs: 13.8 10*3/uL — ABNORMAL HIGH (ref 1.7–7.7)
PLATELETS: 209 10*3/uL (ref 150–400)
RBC: 4.91 MIL/uL (ref 4.22–5.81)
RDW: 14.4 % (ref 11.5–15.5)
WBC: 15.6 10*3/uL — AB (ref 4.0–10.5)

## 2015-10-14 LAB — BASIC METABOLIC PANEL
Anion gap: 13 (ref 5–15)
BUN: 28 mg/dL — AB (ref 6–20)
CALCIUM: 9 mg/dL (ref 8.9–10.3)
CHLORIDE: 103 mmol/L (ref 101–111)
CO2: 30 mmol/L (ref 22–32)
CREATININE: 2.5 mg/dL — AB (ref 0.61–1.24)
GFR calc non Af Amer: 28 mL/min — ABNORMAL LOW (ref >60)
GFR, EST AFRICAN AMERICAN: 33 mL/min — AB (ref >60)
Glucose, Bld: 221 mg/dL — ABNORMAL HIGH (ref 65–99)
Potassium: 4 mmol/L (ref 3.5–5.1)
SODIUM: 146 mmol/L — AB (ref 135–145)

## 2015-10-14 LAB — GLUCOSE, CAPILLARY
GLUCOSE-CAPILLARY: 235 mg/dL — AB (ref 65–99)
GLUCOSE-CAPILLARY: 277 mg/dL — AB (ref 65–99)
GLUCOSE-CAPILLARY: 372 mg/dL — AB (ref 65–99)
GLUCOSE-CAPILLARY: 289 mg/dL — AB (ref 65–99)

## 2015-10-14 LAB — HEMOGLOBIN A1C
HEMOGLOBIN A1C: 7.9 % — AB (ref 4.8–5.6)
Mean Plasma Glucose: 180 mg/dL

## 2015-10-14 LAB — LEGIONELLA ANTIGEN, URINE

## 2015-10-14 MED ORDER — HYDRALAZINE HCL 20 MG/ML IJ SOLN
20.0000 mg | Freq: Four times a day (QID) | INTRAMUSCULAR | Status: DC | PRN
Start: 2015-10-14 — End: 2015-10-19
  Administered 2015-10-14: 20 mg via INTRAVENOUS
  Filled 2015-10-14: qty 1

## 2015-10-14 MED ORDER — MINOXIDIL 2.5 MG PO TABS
2.5000 mg | ORAL_TABLET | Freq: Two times a day (BID) | ORAL | Status: DC
  Administered 2015-10-14 – 2015-10-15 (×2): 2.5 mg via ORAL
  Filled 2015-10-14 (×3): qty 1

## 2015-10-14 MED ORDER — NICARDIPINE HCL IN NACL 20-0.86 MG/200ML-% IV SOLN
3.0000 mg/h | INTRAVENOUS | Status: DC
  Administered 2015-10-14: 5 mg/h via INTRAVENOUS
  Administered 2015-10-14: 7.5 mg/h via INTRAVENOUS
  Administered 2015-10-14 (×2): 5 mg/h via INTRAVENOUS
  Administered 2015-10-15: 2 mg/h via INTRAVENOUS
  Administered 2015-10-15 (×2): 4 mg/h via INTRAVENOUS
  Filled 2015-10-14 (×7): qty 200

## 2015-10-14 MED ORDER — INSULIN ASPART 100 UNIT/ML ~~LOC~~ SOLN
4.0000 [IU] | Freq: Three times a day (TID) | SUBCUTANEOUS | Status: DC
  Administered 2015-10-15 (×2): 4 [IU] via SUBCUTANEOUS

## 2015-10-14 MED ORDER — CLONIDINE HCL 0.2 MG PO TABS
0.2000 mg | ORAL_TABLET | Freq: Three times a day (TID) | ORAL | Status: DC
  Administered 2015-10-14 – 2015-10-18 (×11): 0.2 mg via ORAL
  Filled 2015-10-14: qty 1
  Filled 2015-10-14: qty 2
  Filled 2015-10-14: qty 1
  Filled 2015-10-14 (×3): qty 2
  Filled 2015-10-14 (×2): qty 1
  Filled 2015-10-14 (×3): qty 2
  Filled 2015-10-14: qty 1

## 2015-10-14 MED ORDER — INSULIN GLARGINE 100 UNIT/ML ~~LOC~~ SOLN
35.0000 [IU] | Freq: Two times a day (BID) | SUBCUTANEOUS | Status: DC
  Administered 2015-10-14 – 2015-10-15 (×3): 35 [IU] via SUBCUTANEOUS
  Filled 2015-10-14 (×6): qty 0.35

## 2015-10-14 NOTE — Progress Notes (Signed)
RT assessed patient. Patient desat 87 to 88% on 6L [place]. Patient RR was in the 76s. RT placed BIPAP back on patient. Patient is tolerating well at this time. Sat 94% on 45% FIO2. RT will continue to monitor.

## 2015-10-14 NOTE — Care Management Note (Signed)
Case Management Note  Patient Details  Name: Jerry Cantrell MRN: [id number] Date of Birth: 02-Dec-1965  Subjective/Objective:       Adm w chf             Action/Plan:lives at home   Expected Discharge Date:                Expected Discharge Plan:     In-House Referral:     Discharge planning Services     Post Acute Care Choice:    Choice offered to:     DME Arranged:    DME Agency:     HH Arranged:    [place] Agency:     Status of Service:     Medicare Important Message Given:    Date Medicare IM Given:    Medicare IM give by:    Date Additional Medicare IM Given:    Additional Medicare Important Message give by:     If discussed at [place] of Stay Meetings, dates discussed:    Additional Comments: ur review done  Lacretia Leigh, RN 10/14/2015, 10:59 AM

## 2015-10-14 NOTE — Progress Notes (Signed)
Pt off bipap at this time on 5L [place]. RT will continue to monitor

## 2015-10-14 NOTE — Progress Notes (Signed)
[place] TEAM 1 - Stepdown/ICU TEAM PROGRESS NOTE  Cordarius Luttrell [id number] DOB: May 06, 1966 DOA: 10/13/2015 PCP: Helane Rima, MD  Admit HPI / Brief Narrative: 50 y.o. male with a history of combined systolic and diastolic heart failure, DM2, OSA on BIPAP, and CKD stage III who presented w/ a 1 week history of progressive shortness of breath and pedal edema. He reported increasing his Lasix to 80 mg by mouth twice a day without significant improvement in his volume status.  In the ED he was noted to have a BP of 182/98, normal pulse and respirations, and saturation 98 on BiPAP.  HPI/Subjective: The pt c/o an ongoing HA.  He also reports a diffuse rash which has been present and unchanged for "a few months" despite a course of steroid, and a topical zinc ointment.  He denies cp, n/v, or abdom pain at present.    Assessment/Plan:  Acute respiratory failure with hypoxemia due to acute pulmonary edema diuresing as able - wean O2 as able   Malignant Hypertension Has proven very difficult to control - cardene initiated this morning w/ BP now at goal of ~170-180   Acute on chronic diastolic CHF  Cardiology following - only net negative 1.3L thus far   Chronic kidney disease stage III baseline creatinine around 2.4 - follow w/ ongoing attempts at diuresis   DM2 w/ diabetic neuropathy  A1C 8.9 on 11/2013 - CBG very poorly controlled - adjust tx and follow   Cocaine abuse   Dysuria  UA not c/w UTI  Tobacco abuse  Obesity - Body mass index is 40.15 kg/(m^2).  Code Status: FULL Family Communication: no family present at time of exam Disposition Plan: SDU  Consultants: [facility]   Procedures: TTE - EF 60-65% - no WMA - grade 1 DD  Antibiotics: Azithromycin 2/12 Ceftriaxone 2/12 >  DVT prophylaxis: SQ heparin   Objective: Blood pressure 178/89, pulse 99, temperature 97.6 F (36.4 C), temperature source Oral, resp. rate 22, height 6\' 1"  (1.854 m), weight 138  kg (304 lb 3.8 oz), SpO2 96 %.  Intake/Output Summary (Last 24 hours) at 10/14/15 1816 Last data filed at 10/14/15 1600  Gross per 24 hour  Intake 1919.5 ml  Output   2380 ml  Net -460.5 ml   Exam: General: No acute respiratory distress at rest in bed  Lungs: mild bibasilar crackles - no wheeze  Cardiovascular: Regular rate and rhythm without murmur gallop or rub normal S1 and S2 Abdomen: Nontender, obese, soft, bowel sounds positive, no rebound, no ascites, no appreciable mass Extremities: No significant cyanosis, or clubbing;  1+ edema bilateral lower extremities  Data Reviewed:  Basic Metabolic Panel:  Recent Labs Lab 10/13/15 1130 10/13/15 1141 10/14/15 0217  NA 144 143 146*  K 4.8 4.6 4.0  CL 103 103 103  CO2 29  --  30  GLUCOSE 324* 317* 221*  BUN 30* 36* 28*  CREATININE 2.61* 2.40* 2.50*  CALCIUM 8.8*  --  9.0    CBC:  Recent Labs Lab 10/13/15 1130 10/13/15 1141 10/14/15 0217  WBC 7.2  --  15.6*  NEUTROABS  --   --  13.8*  HGB 14.0 15.6 14.0  HCT 44.9 46.0 46.0  MCV 93.3  --  93.7  PLT 188  --  209    Liver Function Tests: No results for input(s): AST, ALT, ALKPHOS, BILITOT, PROT, ALBUMIN in the last 168 hours. No results for input(s): LIPASE, AMYLASE in the last 168 hours. No results  for input(s): AMMONIA in the last 168 hours.  Cardiac Enzymes:  Recent Labs Lab 10/13/15 1711 10/13/15 1854 10/13/15 2220  TROPONINI 0.05* 0.06* 0.05*    CBG:  Recent Labs Lab 10/13/15 1848 10/13/15 2133 10/14/15 0733 10/14/15 1256 10/14/15 1641  GLUCAP 231* 160* 235* 277* 372*    Recent Results (from the past 240 hour(s))  Culture, blood (routine x 2) Call MD if unable to obtain prior to antibiotics being given     Status: None (Preliminary result)   Collection Time: 10/13/15  4:35 PM  Result Value Ref Range Status   Specimen Description BLOOD RIGHT ANTECUBITAL  Final   Special Requests BOTTLES DRAWN AEROBIC AND ANAEROBIC 5CC  Final   Culture NO  GROWTH < 24 HOURS  Final   Report Status PENDING  Incomplete  Culture, blood (routine x 2) Call MD if unable to obtain prior to antibiotics being given     Status: None (Preliminary result)   Collection Time: 10/13/15  4:40 PM  Result Value Ref Range Status   Specimen Description BLOOD RIGHT HAND  Final   Special Requests BOTTLES DRAWN AEROBIC AND ANAEROBIC 5CC  Final   Culture NO GROWTH < 24 HOURS  Final   Report Status PENDING  Incomplete  MRSA PCR Screening     Status: None   Collection Time: 10/13/15  6:38 PM  Result Value Ref Range Status   MRSA by PCR NEGATIVE NEGATIVE Final    Comment:        The GeneXpert MRSA Assay (FDA approved for NASAL specimens only), is one component of a comprehensive MRSA colonization surveillance program. It is not intended to diagnose MRSA infection nor to guide or monitor treatment for MRSA infections.      Studies:   Recent x-ray studies have been reviewed in detail by the Attending Physician  Scheduled Meds:  Scheduled Meds: . amitriptyline  50 mg Oral QHS  . aspirin  325 mg Oral Daily  . atorvastatin  80 mg Oral Daily  . azithromycin  500 mg Intravenous Q24H  . carvedilol  25 mg Oral BID WC  . cefTRIAXone (ROCEPHIN)  IV  1 g Intravenous Q24H  . cloNIDine  0.2 mg Oral BID  . furosemide  80 mg Intravenous BID  . gabapentin  300 mg Oral Daily  . gabapentin  600 mg Oral QHS  . heparin  5,000 Units Subcutaneous 3 times per day  . hydrALAZINE  25 mg Oral TID  . insulin aspart  0-15 Units Subcutaneous TID WC  . insulin glargine  30 Units Subcutaneous BID  . minoxidil  2.5 mg Oral BID  . potassium chloride  20 mEq Oral Daily  . sodium chloride flush  3 mL Intravenous Q12H    Time spent on care of this patient: 35 mins   Zeus Marquis T , MD   [facility] Office  [phone] Pager - Text Page per Shea Evans as per below:  On-Call/Text Page:      [email or web address]      password [email or web address]  If 7PM-7AM, please contact  night-coverage [email or web address] Password [email or web address] 10/14/2015, 6:16 PM   LOS: 1 day

## 2015-10-14 NOTE — Progress Notes (Signed)
Inpatient Diabetes Program Recommendations  AACE/ADA: New Consensus Statement on Inpatient Glycemic Control (2015)  Target Ranges:  Prepandial:   less than 140 mg/dL      Peak postprandial:   less than 180 mg/dL (1-2 hours)      Critically ill patients:  140 - 180 mg/dL   Review of Glycemic Control:  Results for TELL, DEMCHAK (MRN [id number]) as of 10/14/2015 10:34  Ref. Range 10/13/2015 18:48 10/13/2015 21:33 10/14/2015 07:33  Glucose-Capillary Latest Ref Range: 65-99 mg/dL 231 (H) 160 (H) 235 (H)   Diabetes history: Type 2 diabetes Outpatient Diabetes medications: Lantus 45 units AM, and 35 units q PM, Novolog SSI Current orders for Inpatient glycemic control:  Novolog moderate tid with meals, Lantus 30 units bid  Inpatient Diabetes Program Recommendations:    A1C pending.  Consider adding Novolog meal coverage 5 units tid with meals-hold if patient eats less than 50%.  Thanks, Adah Perl, RN, BC-ADM Inpatient Diabetes Coordinator Pager [phone] (8a-5p)

## 2015-10-14 NOTE — Progress Notes (Signed)
  Echocardiogram 2D Echocardiogram has been performed.  Diamond Nickel 10/14/2015, 11:57 AM

## 2015-10-14 NOTE — Progress Notes (Signed)
Unable to get patient's SBP < 200. PRN hydralazine given with no relief. Nitroglycerin gtt also ineffective and stopped at 0630 am due to severe headache. Dr. Thereasa Solo aware, orders given for Cardene gtt. Will continue to monitor closely.

## 2015-10-14 NOTE — Progress Notes (Signed)
Patient Name: Jerry Cantrell Date of Encounter: 10/14/2015  Principal Problem:   Acute on chronic combined systolic (congestive) and diastolic (congestive) heart failure (HCC) Active Problems:   Hypertension   Smoker   Obstructive sleep apnea   CHF exacerbation (HCC)   CHF (congestive heart failure) ([place])   Legally blind   Acute respiratory failure with hypoxia ([place])   Length of Stay: 1  SUBJECTIVE   BP still very high and IV cardene is giving him a headache. Dyspnea improved. No orthopnea, but a little dyspneic when he stands up to urinate. Good UO. Creat 2.5, up slightly but in his recent range of GFR 30-40. He denied recent cocaine use, but +ve UDS  CURRENT MEDS . amitriptyline  50 mg Oral QHS  . aspirin  325 mg Oral Daily  . atorvastatin  80 mg Oral Daily  . azithromycin  500 mg Intravenous Q24H  . carvedilol  25 mg Oral BID WC  . cefTRIAXone (ROCEPHIN)  IV  1 g Intravenous Q24H  . cloNIDine  0.2 mg Oral BID  . furosemide  80 mg Intravenous BID  . gabapentin  300 mg Oral Daily  . gabapentin  600 mg Oral QHS  . heparin  5,000 Units Subcutaneous 3 times per day  . hydrALAZINE  25 mg Oral TID  . insulin aspart  0-15 Units Subcutaneous TID WC  . insulin glargine  30 Units Subcutaneous BID  . ipratropium-albuterol  3 mL Nebulization Q6H  . potassium chloride  20 mEq Oral Daily  . sodium chloride flush  3 mL Intravenous Q12H    OBJECTIVE   Intake/Output Summary (Last 24 hours) at 10/14/15 1430 Last data filed at 10/14/15 1300  Gross per 24 hour  Intake 1364.5 ml  Output   2475 ml  Net -1110.5 ml   Filed Weights   10/13/15 1207 10/13/15 1845 10/14/15 0500  Weight: 140.7 kg (310 lb 3 oz) 137 kg (302 lb 0.5 oz) 138 kg (304 lb 3.8 oz)    PHYSICAL EXAM Filed Vitals:   10/14/15 1215 10/14/15 1230 10/14/15 1245 10/14/15 1254  BP: 161/94 179/88 152/90   Pulse:    107  Temp:    97.8 F (36.6 C)  TempSrc:    Oral  Resp: 16 11 25    Height:      Weight:       SpO2: 93% 94% 93%    General: Alert, oriented x3, no distress Head: no evidence of trauma, PERRL, EOMI, no exophtalmos or lid lag, no myxedema, no xanthelasma; normal ears, nose and oropharynx Neck: normal jugular venous pulsations and no hepatojugular reflux; brisk carotid pulses without delay and no carotid bruits Chest: clear to auscultation, no signs of consolidation by percussion or palpation, normal fremitus, symmetrical and full respiratory excursions Cardiovascular: normal position and quality of the apical impulse, regular rhythm, normal first and second heart sounds, no rubs or gallops, no murmur Abdomen: no tenderness or distention, no masses by palpation, no abnormal pulsatility or arterial bruits, normal bowel sounds, no hepatosplenomegaly Extremities: no clubbing, cyanosis or edema; 2+ radial, ulnar and brachial pulses bilaterally; 2+ right femoral, posterior tibial and dorsalis pedis pulses; 2+ left femoral, posterior tibial and dorsalis pedis pulses; no subclavian or femoral bruits Neurological: grossly nonfocal  LABS  CBC  Recent Labs  10/13/15 1130 10/13/15 1141 10/14/15 0217  WBC 7.2  --  15.6*  NEUTROABS  --   --  13.8*  HGB 14.0 15.6 14.0  HCT 44.9 46.0 46.0  MCV 93.3  --  93.7  PLT 188  --  [id number]   Basic Metabolic Panel  Recent Labs  10/13/15 1130 10/13/15 1141 10/14/15 0217  NA 144 143 146*  K 4.8 4.6 4.0  CL 103 103 103  CO2 29  --  30  GLUCOSE 324* 317* 221*  BUN 30* 36* 28*  CREATININE 2.61* 2.40* 2.50*  CALCIUM 8.8*  --  9.0   Liver Function Tests No results for input(s): AST, ALT, ALKPHOS, BILITOT, PROT, ALBUMIN in the last 72 hours. No results for input(s): LIPASE, AMYLASE in the last 72 hours. Cardiac Enzymes  Recent Labs  10/13/15 1711 10/13/15 1854 10/13/15 2220  TROPONINI 0.05* 0.06* 0.05*   BNP Invalid input(s): POCBNP D-Dimer No results for input(s): DDIMER in the last 72 hours. Hemoglobin A1C  Recent Labs   10/13/15 1711  HGBA1C 7.9*   Fasting Lipid Panel No results for input(s): CHOL, HDL, LDLCALC, TRIG, CHOLHDL, LDLDIRECT in the last 72 hours. Thyroid Function Tests No results for input(s): TSH, T4TOTAL, T3FREE, THYROIDAB in the last 72 hours.  Invalid input(s): [place]  Radiology Studies Imaging results have been reviewed and Dg Chest Port 1 View  10/14/2015  CLINICAL DATA:  Pneumonia, followup, history hypertension, diabetes mellitus, CHF EXAM: PORTABLE CHEST 1 VIEW COMPARISON:  Portable exam 0721 hours compared to 10/13/2015 FINDINGS: Enlargement of cardiac silhouette with pulmonary vascular congestion. Perihilar and infiltrates bilaterally compatible with pulmonary edema and CHF, asymmetrically greater on RIGHT, not significantly changed. Probable RIGHT pleural effusion and basilar atelectasis. No pneumothorax. Endplate spur formation thoracic spine. IMPRESSION: Persistent CHF with RIGHT pleural effusion and basilar atelectasis, little changed. Electronically Signed   By: Lavonia Dana M.D.   On: 10/14/2015 08:07   Dg Chest Port 1 View  10/13/2015  CLINICAL DATA:  Shortness of breath and chest pain EXAM: PORTABLE CHEST 1 VIEW COMPARISON:  August 07, 2015 FINDINGS: There is interstitial edema with cardiomegaly and pulmonary venous hypertension. There is extensive airspace consolidation in the right middle and lower lobes with right pleural effusion. No adenopathy evident. IMPRESSION: Evidence of congestive heart failure. Probable superimposed pneumonia right middle and lower lobes given the degree of asymmetric consolidation in these areas. Followup PA and lateral chest radiographs recommended in 3-4 weeks following trial of antibiotic therapy to ensure resolution and exclude underlying malignancy. Electronically Signed   By: Lowella Grip III M.D.   On: 10/13/2015 12:39    TELE NSR  ECG NSR, nonspecific IVCD (inc LBBB) and long QT with diffuse ST-T changes  ASSESSMENT AND PLAN  1.  acute on chronic diastolic congestive heart failure Decompensation probably related to accelerated HTN and cocaine use. No longer overtly hypervolemic by exam (limited by obesity). Watch renal function closely - I think we are approaching limits of diuretic therapy.  2. accelerated/malignant hypertension Med choices are limited by renal failure and cocaine use Prefer vasodilators. Start minoxidil  3. chronic stage III renal failure Follow renal function closely with diuresis. Avoid ACEi/ARB/spironolactone  4. cocaine abuse This is a very high risk habit in a patient with his health problems  5. tobacco abuse-patient counseled on discontinuing.   Sanda Klein, MD, [facility] [phone] office [phone] pager 10/14/2015 2:30 PM

## 2015-10-15 DIAGNOSIS — E1140 Type 2 diabetes mellitus with diabetic neuropathy, unspecified: Secondary | ICD-10-CM | POA: Diagnosis present

## 2015-10-15 DIAGNOSIS — IMO0002 Reserved for concepts with insufficient information to code with codable children: Secondary | ICD-10-CM | POA: Diagnosis present

## 2015-10-15 DIAGNOSIS — N183 Chronic kidney disease, stage 3 unspecified: Secondary | ICD-10-CM | POA: Diagnosis present

## 2015-10-15 DIAGNOSIS — F1210 Cannabis abuse, uncomplicated: Secondary | ICD-10-CM | POA: Diagnosis present

## 2015-10-15 DIAGNOSIS — G4733 Obstructive sleep apnea (adult) (pediatric): Secondary | ICD-10-CM

## 2015-10-15 DIAGNOSIS — Z720 Tobacco use: Secondary | ICD-10-CM | POA: Diagnosis present

## 2015-10-15 DIAGNOSIS — E662 Morbid (severe) obesity with alveolar hypoventilation: Secondary | ICD-10-CM | POA: Diagnosis present

## 2015-10-15 DIAGNOSIS — E1165 Type 2 diabetes mellitus with hyperglycemia: Secondary | ICD-10-CM

## 2015-10-15 LAB — COMPREHENSIVE METABOLIC PANEL
ALBUMIN: 2.2 g/dL — AB (ref 3.5–5.0)
ALT: 10 U/L — ABNORMAL LOW (ref 17–63)
AST: 11 U/L — AB (ref 15–41)
Alkaline Phosphatase: 110 U/L (ref 38–126)
Anion gap: 12 (ref 5–15)
BUN: 31 mg/dL — AB (ref 6–20)
CHLORIDE: 101 mmol/L (ref 101–111)
CO2: 31 mmol/L (ref 22–32)
Calcium: 8.9 mg/dL (ref 8.9–10.3)
Creatinine, Ser: 2.71 mg/dL — ABNORMAL HIGH (ref 0.61–1.24)
GFR calc Af Amer: 30 mL/min — ABNORMAL LOW (ref >60)
GFR, EST NON AFRICAN AMERICAN: 26 mL/min — AB (ref >60)
Glucose, Bld: 346 mg/dL — ABNORMAL HIGH (ref 65–99)
POTASSIUM: 4 mmol/L (ref 3.5–5.1)
SODIUM: 144 mmol/L (ref 135–145)
Total Bilirubin: 0.4 mg/dL (ref 0.3–1.2)
Total Protein: 5.8 g/dL — ABNORMAL LOW (ref 6.5–8.1)

## 2015-10-15 LAB — CBC
HEMATOCRIT: 45.2 % (ref 39.0–52.0)
Hemoglobin: 13.3 g/dL (ref 13.0–17.0)
MCH: 28 pg (ref 26.0–34.0)
MCHC: 29.4 g/dL — ABNORMAL LOW (ref 30.0–36.0)
MCV: 95.2 fL (ref 78.0–100.0)
Platelets: 190 10*3/uL (ref 150–400)
RBC: 4.75 MIL/uL (ref 4.22–5.81)
RDW: 14.4 % (ref 11.5–15.5)
WBC: 8.4 10*3/uL (ref 4.0–10.5)

## 2015-10-15 LAB — GLUCOSE, CAPILLARY
GLUCOSE-CAPILLARY: 219 mg/dL — AB (ref 65–99)
GLUCOSE-CAPILLARY: 149 mg/dL — AB (ref 65–99)
GLUCOSE-CAPILLARY: 155 mg/dL — AB (ref 65–99)
Glucose-Capillary: 210 mg/dL — ABNORMAL HIGH (ref 65–99)
Glucose-Capillary: 225 mg/dL — ABNORMAL HIGH (ref 65–99)

## 2015-10-15 MED ORDER — FUROSEMIDE 10 MG/ML IJ SOLN
80.0000 mg | Freq: Three times a day (TID) | INTRAMUSCULAR | Status: DC
Start: 2015-10-15 — End: 2015-10-15

## 2015-10-15 MED ORDER — INSULIN ASPART 100 UNIT/ML ~~LOC~~ SOLN
10.0000 [IU] | Freq: Three times a day (TID) | SUBCUTANEOUS | Status: DC
  Administered 2015-10-15: 10 [IU] via SUBCUTANEOUS

## 2015-10-15 MED ORDER — FUROSEMIDE 80 MG PO TABS
80.0000 mg | ORAL_TABLET | Freq: Two times a day (BID) | ORAL | Status: DC
  Administered 2015-10-15 – 2015-10-19 (×8): 80 mg via ORAL
  Filled 2015-10-15 (×8): qty 1

## 2015-10-15 MED ORDER — MINOXIDIL 2.5 MG PO TABS
5.0000 mg | ORAL_TABLET | Freq: Two times a day (BID) | ORAL | Status: DC
  Administered 2015-10-15: 5 mg via ORAL
  Filled 2015-10-15 (×3): qty 2

## 2015-10-15 MED ORDER — INSULIN ASPART 100 UNIT/ML ~~LOC~~ SOLN
0.0000 [IU] | SUBCUTANEOUS | Status: DC
  Administered 2015-10-15: 3 [IU] via SUBCUTANEOUS
  Administered 2015-10-15: 7 [IU] via SUBCUTANEOUS

## 2015-10-15 NOTE — Progress Notes (Signed)
Pt off bipap 

## 2015-10-15 NOTE — Progress Notes (Signed)
Patient Name: Jerry Cantrell Date of Encounter: 10/15/2015  Principal Problem:   Acute on chronic combined systolic (congestive) and diastolic (congestive) heart failure (HCC) Active Problems:   Hypertension   Smoker   Obstructive sleep apnea   CHF exacerbation (HCC)   CHF (congestive heart failure) (HCC)   Legally blind   Acute respiratory failure with hypoxia (HCC)   Acute on chronic diastolic CHF (congestive heart failure), NYHA class 4 (HCC)   Essential hypertension, malignant   Chronic kidney disease, stage IV (severe) ([place])   Length of Stay: 2  SUBJECTIVE  Feeling a little better, no headache.  CURRENT MEDS . amitriptyline  50 mg Oral QHS  . aspirin  325 mg Oral Daily  . atorvastatin  80 mg Oral Daily  . carvedilol  25 mg Oral BID WC  . cloNIDine  0.2 mg Oral TID  . furosemide  80 mg Intravenous TID  . gabapentin  300 mg Oral Daily  . gabapentin  600 mg Oral QHS  . heparin  5,000 Units Subcutaneous 3 times per day  . hydrALAZINE  25 mg Oral TID  . insulin aspart  0-20 Units Subcutaneous 6 times per day  . insulin aspart  10 Units Subcutaneous TID WC  . insulin glargine  35 Units Subcutaneous BID  . minoxidil  2.5 mg Oral BID  . potassium chloride  20 mEq Oral Daily    OBJECTIVE   Intake/Output Summary (Last 24 hours) at 10/15/15 1427 Last data filed at 10/15/15 1300  Gross per 24 hour  Intake 1844.42 ml  Output   2455 ml  Net -610.58 ml   Filed Weights   10/13/15 1845 10/14/15 0500 10/15/15 0408  Weight: 137 kg (302 lb 0.5 oz) 138 kg (304 lb 3.8 oz) 128.6 kg (283 lb 8.2 oz)    PHYSICAL EXAM Filed Vitals:   10/15/15 1100 10/15/15 1115 10/15/15 1200 10/15/15 1300  BP: 166/93 166/93 152/86 133/67  Pulse:      Temp:  97.8 F (36.6 C)    TempSrc:  Oral    Resp: 23 28 18 14   Height:      Weight:      SpO2: 93% 95% 94% 93%   General: Alert, oriented x3, no distress Head: no evidence of trauma, PERRL, EOMI, no exophtalmos or lid lag, no myxedema, no  xanthelasma; normal ears, nose and oropharynx Neck: normal jugular venous pulsations and no hepatojugular reflux; brisk carotid pulses without delay and no carotid bruits Chest: clear to auscultation, no signs of consolidation by percussion or palpation, normal fremitus, symmetrical and full respiratory excursions Cardiovascular: normal position and quality of the apical impulse, regular rhythm, normal first and second heart sounds, no rubs or gallops, no murmur Abdomen: no tenderness or distention, no masses by palpation, no abnormal pulsatility or arterial bruits, normal bowel sounds, no hepatosplenomegaly Extremities: no clubbing, cyanosis or edema; 2+ radial, ulnar and brachial pulses bilaterally; 2+ right femoral, posterior tibial and dorsalis pedis pulses; 2+ left femoral, posterior tibial and dorsalis pedis pulses; no subclavian or femoral bruits Neurological: grossly nonfocal  LABS  CBC  Recent Labs  10/14/15 0217 10/15/15 0304  WBC 15.6* 8.4  NEUTROABS 13.8*  --   HGB 14.0 13.3  HCT 46.0 45.2  MCV 93.7 95.2  PLT 209 [id number]   Basic Metabolic Panel  Recent Labs  10/14/15 0217 10/15/15 0304  NA 146* 144  K 4.0 4.0  CL 103 101  CO2 30 31  GLUCOSE 221* 346*  BUN  28* 31*  CREATININE 2.50* 2.71*  CALCIUM 9.0 8.9   Liver Function Tests  Recent Labs  10/15/15 0304  AST 11*  ALT 10*  ALKPHOS 110  BILITOT 0.4  PROT 5.8*  ALBUMIN 2.2*   No results for input(s): LIPASE, AMYLASE in the last 72 hours. Cardiac Enzymes  Recent Labs  10/13/15 1711 10/13/15 1854 10/13/15 2220  TROPONINI 0.05* 0.06* 0.05*   BNP Invalid input(s): POCBNP D-Dimer No results for input(s): DDIMER in the last 72 hours. Hemoglobin A1C  Recent Labs  10/13/15 1711  HGBA1C 7.9*   Radiology Studies Imaging results have been reviewed and Dg Chest Port 1 View  10/14/2015  CLINICAL DATA:  Pneumonia, followup, history hypertension, diabetes mellitus, CHF EXAM: PORTABLE CHEST 1 VIEW  COMPARISON:  Portable exam 0721 hours compared to 10/13/2015 FINDINGS: Enlargement of cardiac silhouette with pulmonary vascular congestion. Perihilar and infiltrates bilaterally compatible with pulmonary edema and CHF, asymmetrically greater on RIGHT, not significantly changed. Probable RIGHT pleural effusion and basilar atelectasis. No pneumothorax. Endplate spur formation thoracic spine. IMPRESSION: Persistent CHF with RIGHT pleural effusion and basilar atelectasis, little changed. Electronically Signed   By: Lavonia Dana M.D.   On: 10/14/2015 08:07    TELE NSR   ASSESSMENT AND PLAN  1. acute on chronic diastolic congestive heart failure Decompensation probably related to accelerated HTN and cocaine use. No longer overtly hypervolemic by exam (limited by obesity). Renal parameters a little worse. Will reduce diuretics, switch to PO  2. accelerated/malignant hypertension Med choices are limited by renal failure and cocaine use Prefer vasodilators. Increase minoxidil  3. chronic stage III renal failure Follow renal function closely with diuresis. Avoid ACEi/ARB/spironolactone  4. cocaine abuse This is a very high risk habit in a patient with his health problems.Counseled in detail re; rsik of serious/fatal consequences of cocaine use with his other health problems  5. tobacco abuse - patient counseled on discontinuing.   Sanda Klein, MD, [facility] [phone] office [phone] pager 10/15/2015 2:27 PM

## 2015-10-15 NOTE — Progress Notes (Signed)
[facility] TEAM 1 - Stepdown/ICU TEAM Progress Note  Jerry Cantrell [id number] DOB: 1966/07/24 DOA: 10/13/2015 PCP: Helane Rima, MD  Admit HPI / Brief Narrative: 50 y.o. WM PMHx Substance Abuse(Marijuana and Cocaine), ETOH Abuse, Combined Systolic and Diastolic CHF, Tobaco Abuse, OSA on BiPaP/3 L O2 during the day, HLD, DM Type 2 insulin-dependent, , CKD stage IIIB, Lumbar Spondylosis  Presenting to the hospital on 1 week history of progressive shortness of breath, with increasing dyspnea on exertion, progressing to orthopnea and pedal edema. He reports having gained 11 pounds since January 23/2017. He reports that during the last 2 months, he has in been increasing his Lasix demands, to 80 mg by mouth twice a day on admission, without significant improvement of his volume status. The patient denies any chest pain, but he does have mild productive cough, which is chronic. He has also noted significant leg swelling over the last week. He has not been able to ambulate as much due to shortness of breath. He denies any hemoptysis. He denies any recent cocaine use. He continues to smoke up to one pack a day. He also complains of increasing abdominal girth. The patient reports that he is cardiologist changed his hydralazine from 10 to 25 mg 3 times a day, And also added minoxidil 5 mg daily. Denies any sick contacts. At the ED, he was noted to have significant hypertension, currently at 182/98, normal pulse and respirations, his saturation is 98 on BiPAP. Cardiology evaluation was obtained, ordering Lasix 80 mg IV twice a day, with close renal function monitoring. IV nitroglycerin was added as well. 2-D echo is pending. Of note, his BNP is minimally elevated at 252. His blood pressure home medications were resumed. In addition, a chest x-ray noted patient to have underlying, superimposed pneumonia in the setting of CHF. Procalcitonin is pending    HPI/Subjective: 2/14 A/O 4, acute on chronic  respiratory distress. States was instructed at last discharge to use 2-3 L O2 at home "but weaned himself off of it". States was suppose to follow-up with [facility] but unable to make appointment. Unable to afford home SPO2 monitor, but continues to smoke marijuana and use cocaine. Unknown base weight   Assessment/Plan:  Acute on Chronic respiratory failure with hypoxemia due to acute pulmonary edema -Titrate O2 to maintain SPO2 89-93%  -Continue current antibiotics  OSA/obesity Hypoventilation syndrome -Continue BiPAP at night - Malignant Hypertension -Coreg 25 mg BID -Clonidine 0.2 mg TID -Increase Lasix 80 mg TID -Hydralazine 25 mgTID -Minoxidil 5 mg BID -Nicardipine drip now    Acute on chronic diastolic CHF  -Unknown base weight -Strict I&O since admission -1.9 L -Daily weight -Cardiology following  -See malignant HTN  Chronic kidney disease stage III(baseline creatinine~ 2.4)  - follow w/ ongoing attempts at diuresis, worsening creatinine, unfortunately patient's O2 demand and PCXR shows continue ongoing pulmonary edema -See acute on chronic CHF   DM Type 2 w/ diabetic neuropathy Uncontrolled  -2/12 hemoglobin A1c= 7.9 -Continue Lantus 35 units BID -Increase NovoLog 10 units QAC -Increase resistant SSI  Cocaine/Marijuana abuse  -Continues to use; counseled extensively on sequela of continuing to use to include death  Dysuria  -UA not c/w UTI  Tobacco abuse  Obesity - Body mass index is 40.15 kg/(m^2).   Code Status: FULL Family Communication: no family present at time of exam Disposition Plan: SNF vs LTAC    Consultants: [facility]   Procedure/Significant Events: TTE - EF 60-65% - no WMA - grade 1 DD  2/13 PCXR;Persistent CHF with RIGHT pleural effusion and basilar atelectasis, little changed.  Culture 2/12 blood right AC/hand NGTD 2/12 MRSA by PCR negative 2/12 strep pneumo/Legionella urine antigen negative   Antibiotics: Azithromycin  2/12>> 2/13 Ceftriaxone 2/12 >> 2/13  DVT prophylaxis: Heparin subcutaneous   Devices NA   LINES / TUBES:  NA    Continuous Infusions: . niCARDipine 3 mg/hr (10/15/15 1200)    Objective: VITAL SIGNS: Temp: 97.8 F (36.6 C) (02/14 1115) Temp Source: Oral (02/14 1115) BP: 133/67 mmHg (02/14 1300) SPO2; FIO2:   Intake/Output Summary (Last 24 hours) at 10/15/15 1507 Last data filed at 10/15/15 1300  Gross per 24 hour  Intake 1579.42 ml  Output   2455 ml  Net -875.58 ml     Exam: General: A/O 4, positive Acute on Chronic respiratory distress Eyes: Negative headache, negative scleral hemorrhage ENT: Negative Runny nose, negative gingival bleeding, Neck:  Negative scars, masses, torticollis, lymphadenopathy, JVD Lungs: poor air movement bibasilar, without wheezes or crackles Cardiovascular: Regular rate and rhythm without murmur gallop or rub normal S1 and S2 Abdomen: Morbidly obese negative abdominal pain, nondistended, positive soft, bowel sounds, no rebound, no ascites, no appreciable mass Extremities: No significant cyanosis, clubbing. Pedal edema bilateral 2-3+ Psychiatric:  Negative depression, negative anxiety, negative fatigue, negative mania  Neurologic:  Cranial nerves II through XII intact, tongue/uvula midline, all extremities muscle strength 5/5, sensation intact throughout, negative dysarthria, negative expressive aphasia, negative receptive aphasia.   Data Reviewed: Basic Metabolic Panel:  Recent Labs Lab 10/13/15 1130 10/13/15 1141 10/14/15 0217 10/15/15 0304  NA 144 143 146* 144  K 4.8 4.6 4.0 4.0  CL 103 103 103 101  CO2 29  --  30 31  GLUCOSE 324* 317* 221* 346*  BUN 30* 36* 28* 31*  CREATININE 2.61* 2.40* 2.50* 2.71*  CALCIUM 8.8*  --  9.0 8.9   Liver Function Tests:  Recent Labs Lab 10/15/15 0304  AST 11*  ALT 10*  ALKPHOS 110  BILITOT 0.4  PROT 5.8*  ALBUMIN 2.2*   No results for input(s): LIPASE, AMYLASE in the last 168  hours. No results for input(s): AMMONIA in the last 168 hours. CBC:  Recent Labs Lab 10/13/15 1130 10/13/15 1141 10/14/15 0217 10/15/15 0304  WBC 7.2  --  15.6* 8.4  NEUTROABS  --   --  13.8*  --   HGB 14.0 15.6 14.0 13.3  HCT 44.9 46.0 46.0 45.2  MCV 93.3  --  93.7 95.2  PLT 188  --  209 190   Cardiac Enzymes:  Recent Labs Lab 10/13/15 1711 10/13/15 1854 10/13/15 2220  TROPONINI 0.05* 0.06* 0.05*   BNP (last 3 results)  Recent Labs  06/06/15 1325 08/07/15 1207 10/13/15 1130  BNP 295.0* 175.3* 252.7*    ProBNP (last 3 results)  Recent Labs  07/16/15 1158  PROBNP 225.0*    CBG:  Recent Labs Lab 10/14/15 1256 10/14/15 1641 10/14/15 2143 10/15/15 0812 10/15/15 1118  GLUCAP 277* 372* 289* 210* 219*    Recent Results (from the past 240 hour(s))  Culture, blood (routine x 2) Call MD if unable to obtain prior to antibiotics being given     Status: None (Preliminary result)   Collection Time: 10/13/15  4:35 PM  Result Value Ref Range Status   Specimen Description BLOOD RIGHT ANTECUBITAL  Final   Special Requests BOTTLES DRAWN AEROBIC AND ANAEROBIC 5CC  Final   Culture NO GROWTH < 24 HOURS  Final   Report Status  PENDING  Incomplete  Culture, blood (routine x 2) Call MD if unable to obtain prior to antibiotics being given     Status: None (Preliminary result)   Collection Time: 10/13/15  4:40 PM  Result Value Ref Range Status   Specimen Description BLOOD RIGHT HAND  Final   Special Requests BOTTLES DRAWN AEROBIC AND ANAEROBIC 5CC  Final   Culture NO GROWTH < 24 HOURS  Final   Report Status PENDING  Incomplete  MRSA PCR Screening     Status: None   Collection Time: 10/13/15  6:38 PM  Result Value Ref Range Status   MRSA by PCR NEGATIVE NEGATIVE Final    Comment:        The GeneXpert MRSA Assay (FDA approved for NASAL specimens only), is one component of a comprehensive MRSA colonization surveillance program. It is not intended to diagnose  MRSA infection nor to guide or monitor treatment for MRSA infections.      Studies:  Recent x-ray studies have been reviewed in detail by the Attending Physician  Scheduled Meds:  Scheduled Meds: . amitriptyline  50 mg Oral QHS  . aspirin  325 mg Oral Daily  . atorvastatin  80 mg Oral Daily  . carvedilol  25 mg Oral BID WC  . cloNIDine  0.2 mg Oral TID  . furosemide  80 mg Oral BID  . gabapentin  300 mg Oral Daily  . gabapentin  600 mg Oral QHS  . heparin  5,000 Units Subcutaneous 3 times per day  . hydrALAZINE  25 mg Oral TID  . insulin aspart  0-20 Units Subcutaneous 6 times per day  . insulin aspart  10 Units Subcutaneous TID WC  . insulin glargine  35 Units Subcutaneous BID  . minoxidil  5 mg Oral BID  . potassium chloride  20 mEq Oral Daily    Time spent on care of this patient: 40 mins   WOODS, Geraldo Docker , MD  [facility] Office  [phone] Pager - [phone]  On-Call/Text Page:      [email or web address]      password [email or web address]  If 7PM-7AM, please contact night-coverage [email or web address] Password [email or web address] 10/15/2015, 3:07 PM   LOS: 2 days   Care during the described time interval was provided by me .  I have reviewed this patient's available data, including medical history, events of note, physical examination, and all test results as part of my evaluation. I have personally reviewed and interpreted all radiology studies.   Dia Crawford, MD [phone] Pager

## 2015-10-15 NOTE — Progress Notes (Signed)
RT placed patient on BIPAP HS. Patient states he wears BIPAP at night at home. Patient placed on 12/6 and 45%. Patient tolerating well. RT will continue to monitor as needed.

## 2015-10-16 DIAGNOSIS — E662 Morbid (severe) obesity with alveolar hypoventilation: Secondary | ICD-10-CM

## 2015-10-16 LAB — GLUCOSE, CAPILLARY
GLUCOSE-CAPILLARY: 59 mg/dL — AB (ref 65–99)
GLUCOSE-CAPILLARY: 91 mg/dL (ref 65–99)
GLUCOSE-CAPILLARY: 76 mg/dL (ref 65–99)
GLUCOSE-CAPILLARY: 109 mg/dL — AB (ref 65–99)
GLUCOSE-CAPILLARY: 106 mg/dL — AB (ref 65–99)
Glucose-Capillary: 57 mg/dL — ABNORMAL LOW (ref 65–99)
Glucose-Capillary: 30 mg/dL — CL (ref 65–99)
Glucose-Capillary: 68 mg/dL (ref 65–99)
Glucose-Capillary: 91 mg/dL (ref 65–99)
Glucose-Capillary: 154 mg/dL — ABNORMAL HIGH (ref 65–99)

## 2015-10-16 MED ORDER — DEXTROSE 50 % IV SOLN
INTRAVENOUS | Status: AC
  Filled 2015-10-16: qty 50

## 2015-10-16 MED ORDER — DEXTROSE 50 % IV SOLN
50.0000 mL | Freq: Once | INTRAVENOUS | Status: AC
Start: 2015-10-16 — End: 2015-10-16
  Administered 2015-10-16: 25 mL via INTRAVENOUS

## 2015-10-16 MED ORDER — CAMPHOR-MENTHOL 0.5-0.5 % EX LOTN
TOPICAL_LOTION | CUTANEOUS | Status: DC | PRN
  Administered 2015-10-16: 23:00:00 via TOPICAL
  Filled 2015-10-16 (×4): qty 222

## 2015-10-16 MED ORDER — LEVALBUTEROL HCL 0.63 MG/3ML IN NEBU: 0.6300 mg | INHALATION_SOLUTION | RESPIRATORY_TRACT | Status: DC | PRN

## 2015-10-16 MED ORDER — INSULIN GLARGINE 100 UNIT/ML ~~LOC~~ SOLN
35.0000 [IU] | Freq: Every day | SUBCUTANEOUS | Status: DC
  Administered 2015-10-17 – 2015-10-22 (×6): 35 [IU] via SUBCUTANEOUS
  Filled 2015-10-16 (×6): qty 0.35

## 2015-10-16 MED ORDER — HYDROXYZINE HCL 25 MG PO TABS
25.0000 mg | ORAL_TABLET | Freq: Three times a day (TID) | ORAL | Status: DC | PRN
  Administered 2015-10-16 – 2015-10-22 (×12): 25 mg via ORAL
  Filled 2015-10-16 (×14): qty 1

## 2015-10-16 MED ORDER — MINOXIDIL 10 MG PO TABS
10.0000 mg | ORAL_TABLET | Freq: Two times a day (BID) | ORAL | Status: DC
  Administered 2015-10-16 – 2015-10-19 (×7): 10 mg via ORAL
  Filled 2015-10-16 (×8): qty 1

## 2015-10-16 MED ORDER — INSULIN ASPART 100 UNIT/ML ~~LOC~~ SOLN
4.0000 [IU] | Freq: Three times a day (TID) | SUBCUTANEOUS | Status: DC
  Administered 2015-10-16 – 2015-10-18 (×4): 4 [IU] via SUBCUTANEOUS

## 2015-10-16 MED ORDER — INSULIN ASPART 100 UNIT/ML ~~LOC~~ SOLN
0.0000 [IU] | Freq: Every day | SUBCUTANEOUS | Status: DC
  Administered 2015-10-17: 3 [IU] via SUBCUTANEOUS

## 2015-10-16 MED ORDER — DEXTROSE 50 % IV SOLN
INTRAVENOUS | Status: AC
  Administered 2015-10-16: 25 mL
  Filled 2015-10-16: qty 50

## 2015-10-16 MED ORDER — DEXTROSE 50 % IV SOLN
50.0000 mL | Freq: Once | INTRAVENOUS | Status: AC
  Administered 2015-10-16: 50 mL via INTRAVENOUS

## 2015-10-16 MED ORDER — INSULIN ASPART 100 UNIT/ML ~~LOC~~ SOLN
0.0000 [IU] | Freq: Three times a day (TID) | SUBCUTANEOUS | Status: DC
  Administered 2015-10-16: 2 [IU] via SUBCUTANEOUS
  Administered 2015-10-17: 5 [IU] via SUBCUTANEOUS
  Administered 2015-10-17: 7 [IU] via SUBCUTANEOUS
  Administered 2015-10-18: 1 [IU] via SUBCUTANEOUS
  Administered 2015-10-18 – 2015-10-19 (×4): 5 [IU] via SUBCUTANEOUS
  Administered 2015-10-19: 3 [IU] via SUBCUTANEOUS
  Administered 2015-10-20: 5 [IU] via SUBCUTANEOUS
  Administered 2015-10-20: 2 [IU] via SUBCUTANEOUS
  Administered 2015-10-21 (×2): 1 [IU] via SUBCUTANEOUS
  Administered 2015-10-21: 2 [IU] via SUBCUTANEOUS
  Administered 2015-10-22: 9 [IU] via SUBCUTANEOUS
  Administered 2015-10-22: 2 [IU] via SUBCUTANEOUS

## 2015-10-16 NOTE — Progress Notes (Signed)
Patient has extreme itching of the rash that is seen with most renal failure pateint's. Patient has atarax PRN and will be given at bedtime per patient's request but is requesting the lotion for it causes skin to be itchy and dry. Notified and paged hospitalist via text page.Awaiting return page and or orders.

## 2015-10-16 NOTE — Progress Notes (Signed)
New orders given for Sarna lotion and will be given as ordered.

## 2015-10-16 NOTE — Progress Notes (Addendum)
[facility] TEAM 1 - Stepdown/ICU TEAM PROGRESS NOTE  Jerry Cantrell [id number] DOB: 1965-12-20 DOA: 10/13/2015 PCP: Helane Rima, MD  Admit HPI / Brief Narrative: 51 y.o. male with a history of combined systolic and diastolic heart failure, DM2, OSA on BIPAP, and CKD stage III who presented w/ a 1 week history of progressive shortness of breath and pedal edema. He reported increasing his Lasix to 80 mg by mouth twice a day without significant improvement in his volume status.  In the ED he was noted to have a BP of 182/98, normal pulse and respirations, and saturation 98 on BiPAP.  HPI/Subjective: The patient complains of persisting diffuse rash and itching which has been present for "over a month."  He denies chest pain nausea vomiting or abdominal pain.  He states he continues to feel short of breath but admits that it is better than when he first came in.  Assessment/Plan:  Acute respiratory failure with hypoxemia due to acute pulmonary edema diuresing as able - wean O2 as able   Malignant Hypertension Has proven very difficult to control - improved overall but is requiring multiple agents - is now off cardene - cont to titrate meds and follow trend   Acute on chronic diastolic CHF  Decompensation due to cocaine abuse + HTN - Cardiology following - net negative 3.3L thus far   Chronic kidney disease stage III baseline creatinine around 2.4 - crt has climbed w/ diuresis - follow trend  DM2 w/ diabetic neuropathy  A1C 8.9 on 11/2013 - CBG now very well controlled, w/ some hypoglycemia early this morning - reduce insulin dose and follow   Cocaine abuse  patient has been counseled on the absolute requirement that he discontinue cocaine use completely  Dysuria  UA not c/w UTI  Tobacco abuse patient has been counseled on the absolute need to discontinue tobacco abuse  Obesity - Body mass index is 40.47 kg/(m^2).   OSA/obesity Hypoventilation syndrome -Continue BiPAP at  night and w/ naps   Diffuse rash  -no clear etiology on exam - is subacute - distribution not c/w scabies - lesions not suggestive of bed bugs - atarax prn - consider outpt Derm eval for bx if persists   Code Status: FULL Family Communication: no family present at time of exam Disposition Plan: stable for transfer to tele bed - cont diuresis as renal fxn allows   Consultants: [facility] Cardiology   Procedures: TTE - EF 60-65% - no WMA - grade 1 DD  Antibiotics: Azithromycin 2/12 > 2/13 Ceftriaxone 2/12 > 2/13  DVT prophylaxis: SQ heparin   Objective: Blood pressure 168/79, pulse 71, temperature 96.9 F (36.1 C), temperature source Axillary, resp. rate 10, height 6\' 1"  (1.854 m), weight 139.118 kg (306 lb 11.2 oz), SpO2 98 %.  Intake/Output Summary (Last 24 hours) at 10/16/15 1534 Last data filed at 10/16/15 1503  Gross per 24 hour  Intake 786.83 ml  Output   2550 ml  Net -1763.17 ml   Exam: General: No acute respiratory distress evident Lungs: mild bibasilar crackles persist - no wheezing  Cardiovascular: Regular rate and rhythm without murmur gallop or rub = Abdomen: Nontender, obese, soft, bowel sounds positive, no rebound, no ascites, no appreciable mass Extremities: No significant cyanosis, or clubbing;  1+ edema bilateral lower extremities persists  Data Reviewed:  Basic Metabolic Panel:  Recent Labs Lab 10/13/15 1130 10/13/15 1141 10/14/15 0217 10/15/15 0304  NA 144 143 146* 144  K 4.8 4.6 4.0 4.0  CL  103 103 103 101  CO2 29  --  30 31  GLUCOSE 324* 317* 221* 346*  BUN 30* 36* 28* 31*  CREATININE 2.61* 2.40* 2.50* 2.71*  CALCIUM 8.8*  --  9.0 8.9    CBC:  Recent Labs Lab 10/13/15 1130 10/13/15 1141 10/14/15 0217 10/15/15 0304  WBC 7.2  --  15.6* 8.4  NEUTROABS  --   --  13.8*  --   HGB 14.0 15.6 14.0 13.3  HCT 44.9 46.0 46.0 45.2  MCV 93.3  --  93.7 95.2  PLT 188  --  209 190    Liver Function Tests:  Recent Labs Lab 10/15/15 0304  AST  11*  ALT 10*  ALKPHOS 110  BILITOT 0.4  PROT 5.8*  ALBUMIN 2.2*    Cardiac Enzymes:  Recent Labs Lab 10/13/15 1711 10/13/15 1854 10/13/15 2220  TROPONINI 0.05* 0.06* 0.05*    CBG:  Recent Labs Lab 10/16/15 0440 10/16/15 0533 10/16/15 0622 10/16/15 0751 10/16/15 1115  GLUCAP 30* 68 91 76 109*    Recent Results (from the past 240 hour(s))  Culture, blood (routine x 2) Call MD if unable to obtain prior to antibiotics being given     Status: None (Preliminary result)   Collection Time: 10/13/15  4:35 PM  Result Value Ref Range Status   Specimen Description BLOOD RIGHT ANTECUBITAL  Final   Special Requests BOTTLES DRAWN AEROBIC AND ANAEROBIC 5CC  Final   Culture NO GROWTH 3 DAYS  Final   Report Status PENDING  Incomplete  Culture, blood (routine x 2) Call MD if unable to obtain prior to antibiotics being given     Status: None (Preliminary result)   Collection Time: 10/13/15  4:40 PM  Result Value Ref Range Status   Specimen Description BLOOD RIGHT HAND  Final   Special Requests BOTTLES DRAWN AEROBIC AND ANAEROBIC 5CC  Final   Culture NO GROWTH 3 DAYS  Final   Report Status PENDING  Incomplete  MRSA PCR Screening     Status: None   Collection Time: 10/13/15  6:38 PM  Result Value Ref Range Status   MRSA by PCR NEGATIVE NEGATIVE Final    Comment:        The GeneXpert MRSA Assay (FDA approved for NASAL specimens only), is one component of a comprehensive MRSA colonization surveillance program. It is not intended to diagnose MRSA infection nor to guide or monitor treatment for MRSA infections.      Studies:   Recent x-ray studies have been reviewed in detail by the Attending Physician  Scheduled Meds:  Scheduled Meds: . amitriptyline  50 mg Oral QHS  . aspirin  325 mg Oral Daily  . atorvastatin  80 mg Oral Daily  . carvedilol  25 mg Oral BID WC  . cloNIDine  0.2 mg Oral TID  . furosemide  80 mg Oral BID  . gabapentin  300 mg Oral Daily  . gabapentin   600 mg Oral QHS  . heparin  5,000 Units Subcutaneous 3 times per day  . insulin aspart  0-20 Units Subcutaneous 6 times per day  . insulin aspart  10 Units Subcutaneous TID WC  . insulin glargine  35 Units Subcutaneous BID  . minoxidil  10 mg Oral BID  . potassium chloride  20 mEq Oral Daily    Time spent on care of this patient: 35 mins   Dekisha Mesmer T , MD   [facility] Office  [phone] Pager - Text Page per  Amion as per below:  On-Call/Text Page:      [email or web address]      password [email or web address]  If 7PM-7AM, please contact night-coverage [email or web address] Password [facility] 10/16/2015, 3:34 PM   LOS: 3 days

## 2015-10-16 NOTE — Progress Notes (Signed)
Patient transferred from [facility] to room 3E06.VSS. Oriented patient to the heart failure floor and room equipment. Second verification of telemetry leads and box number are completed.  Currently complains of no pain or discomfort at this time.  Will continue to monitor patient to end of shift.

## 2015-10-16 NOTE — Progress Notes (Signed)
Placed pt on bipap to nap. RT will continue to monitor.

## 2015-10-16 NOTE — Progress Notes (Signed)
Inpatient Diabetes Program Recommendations  AACE/ADA: New Consensus Statement on Inpatient Glycemic Control (2015)  Target Ranges:  Prepandial:   less than 140 mg/dL      Peak postprandial:   less than 180 mg/dL (1-2 hours)      Critically ill patients:  140 - 180 mg/dL   Review of Glycemic Control  Inpatient Diabetes Program Recommendations:  Insulin - Basal: consider reducing Lantus to 35 units once a day  Correction (SSI): change to TID + HS scale  Thank you  Raoul Pitch BSN, RN,CDE Inpatient Diabetes Coordinator [phone] (team pager)

## 2015-10-16 NOTE — Progress Notes (Signed)
RT NOTE:  BIPAP QHS per MD order. RT setup patient on home settings per pt request (16/6) w/ 20 minute ramp time @ 6cm, 2L O2 bled in, humidity chamber filled, full face mask. Pt agrees this is comfortable and ready to sleep @ this time. RT will monitor.

## 2015-10-16 NOTE — Progress Notes (Signed)
Patient Name: Jerry Cantrell Date of Encounter: 10/16/2015  Principal Problem:   Acute on chronic combined systolic (congestive) and diastolic (congestive) heart failure (HCC) Active Problems:   Hypertension   Smoker   Obstructive sleep apnea   CHF exacerbation (HCC)   CHF (congestive heart failure) (HCC)   Legally blind   Acute respiratory failure with hypoxia (HCC)   Acute on chronic diastolic CHF (congestive heart failure), NYHA class 4 (HCC)   Essential hypertension, malignant   Chronic kidney disease, stage IV (severe) (HCC)   OSA (obstructive sleep apnea)   Obesity hypoventilation syndrome (HCC)   Acute pulmonary edema (HCC)   CKD (chronic kidney disease), stage III   Type 2 diabetes, uncontrolled, with neuropathy ([place])   Marijuana abuse   Tobacco abuse   Length of Stay: 3  SUBJECTIVE  No longer has a headache. No dyspnea.  CURRENT MEDS . amitriptyline  50 mg Oral QHS  . aspirin  325 mg Oral Daily  . atorvastatin  80 mg Oral Daily  . carvedilol  25 mg Oral BID WC  . cloNIDine  0.2 mg Oral TID  . furosemide  80 mg Oral BID  . gabapentin  300 mg Oral Daily  . gabapentin  600 mg Oral QHS  . heparin  5,000 Units Subcutaneous 3 times per day  . insulin aspart  0-20 Units Subcutaneous 6 times per day  . insulin aspart  10 Units Subcutaneous TID WC  . insulin glargine  35 Units Subcutaneous BID  . minoxidil  10 mg Oral BID  . potassium chloride  20 mEq Oral Daily    OBJECTIVE   Intake/Output Summary (Last 24 hours) at 10/16/15 0917 Last data filed at 10/16/15 0500  Gross per 24 hour  Intake 746.83 ml  Output   2000 ml  Net -1253.17 ml   Filed Weights   10/14/15 0500 10/15/15 0408 10/16/15 0500  Weight: 138 kg (304 lb 3.8 oz) 128.6 kg (283 lb 8.2 oz) 139.118 kg (306 lb 11.2 oz)    PHYSICAL EXAM Filed Vitals:   10/16/15 0300 10/16/15 0400 10/16/15 0500 10/16/15 0748  BP: 145/72 150/81 147/81 147/62  Pulse:      Temp:  97.3 F (36.3 C)  98.5 F (36.9 C)   TempSrc:  Oral  Oral  Resp: 13 15 14 19   Height:      Weight:   139.118 kg (306 lb 11.2 oz)   SpO2: 94% 90% 97% 97%   General: Alert, oriented x3, no distress Head: no evidence of trauma, PERRL, EOMI, no exophtalmos or lid lag, no myxedema, no xanthelasma; normal ears, nose and oropharynx Neck: normal jugular venous pulsations and no hepatojugular reflux; brisk carotid pulses without delay and no carotid bruits Chest: clear to auscultation, no signs of consolidation by percussion or palpation, normal fremitus, symmetrical and full respiratory excursions Cardiovascular: normal position and quality of the apical impulse, regular rhythm, normal first and second heart sounds, no rubs or gallops, no murmur Abdomen: no tenderness or distention, no masses by palpation, no abnormal pulsatility or arterial bruits, normal bowel sounds, no hepatosplenomegaly Extremities: no clubbing, cyanosis or edema; 2+ radial, ulnar and brachial pulses bilaterally; 2+ right femoral, posterior tibial and dorsalis pedis pulses; 2+ left femoral, posterior tibial and dorsalis pedis pulses; no subclavian or femoral bruits Neurological: grossly nonfocal  LABS  CBC  Recent Labs  10/14/15 0217 10/15/15 0304  WBC 15.6* 8.4  NEUTROABS 13.8*  --   HGB 14.0 13.3  HCT 46.0  45.2  MCV 93.7 95.2  PLT 209 [id number]   Basic Metabolic Panel  Recent Labs  10/14/15 0217 10/15/15 0304  NA 146* 144  K 4.0 4.0  CL 103 101  CO2 30 31  GLUCOSE 221* 346*  BUN 28* 31*  CREATININE 2.50* 2.71*  CALCIUM 9.0 8.9   Liver Function Tests  Recent Labs  10/15/15 0304  AST 11*  ALT 10*  ALKPHOS 110  BILITOT 0.4  PROT 5.8*  ALBUMIN 2.2*   No results for input(s): LIPASE, AMYLASE in the last 72 hours. Cardiac Enzymes  Recent Labs  10/13/15 1711 10/13/15 1854 10/13/15 2220  TROPONINI 0.05* 0.06* 0.05*   BNP Invalid input(s): POCBNP D-Dimer No results for input(s): DDIMER in the last 72 hours. Hemoglobin  A1C  Recent Labs  10/13/15 1711  HGBA1C 7.9*    Radiology Studies Imaging results have been reviewed and No results found.  TELE NSR    ASSESSMENT AND PLAN  1. acute on chronic diastolic congestive heart failure Decompensation probably related to accelerated HTN and cocaine use. Still negative fluid balance, switched to PO diuretics.. Needs repeat labs.  2. accelerated/malignant hypertension Med choices are limited by renal failure and cocaine use Prefer vasodilators. Try to eliminate hydralazine due TID dosing and unlikely compliance. Increase minoxidil.  3. chronic stage III renal failure Follow renal function closely. Avoid ACEi/ARB/spironolactone  4. cocaine abuse This is a very high risk habit in a patient with his health problems. I told him the next cocaine use could lead to death.  5. tobacco abuse-patient counseled on discontinuing.    Sanda Klein, MD, [facility] [phone] office [phone] pager 10/16/2015 9:17 AM

## 2015-10-17 DIAGNOSIS — N289 Disorder of kidney and ureter, unspecified: Secondary | ICD-10-CM

## 2015-10-17 DIAGNOSIS — N189 Chronic kidney disease, unspecified: Secondary | ICD-10-CM

## 2015-10-17 DIAGNOSIS — I509 Heart failure, unspecified: Secondary | ICD-10-CM | POA: Diagnosis present

## 2015-10-17 DIAGNOSIS — E785 Hyperlipidemia, unspecified: Secondary | ICD-10-CM

## 2015-10-17 LAB — CBC
HCT: 43 % (ref 39.0–52.0)
Hemoglobin: 13.1 g/dL (ref 13.0–17.0)
MCH: 28.5 pg (ref 26.0–34.0)
MCHC: 30.5 g/dL (ref 30.0–36.0)
MCV: 93.5 fL (ref 78.0–100.0)
PLATELETS: 162 10*3/uL (ref 150–400)
RBC: 4.6 MIL/uL (ref 4.22–5.81)
RDW: 14 % (ref 11.5–15.5)
WBC: 7 10*3/uL (ref 4.0–10.5)

## 2015-10-17 LAB — GLUCOSE, CAPILLARY
GLUCOSE-CAPILLARY: 41 mg/dL — AB (ref 65–99)
GLUCOSE-CAPILLARY: 77 mg/dL (ref 65–99)
GLUCOSE-CAPILLARY: 333 mg/dL — AB (ref 65–99)
GLUCOSE-CAPILLARY: 273 mg/dL — AB (ref 65–99)
Glucose-Capillary: 167 mg/dL — ABNORMAL HIGH (ref 65–99)
Glucose-Capillary: 268 mg/dL — ABNORMAL HIGH (ref 65–99)
Glucose-Capillary: 295 mg/dL — ABNORMAL HIGH (ref 65–99)

## 2015-10-17 LAB — BASIC METABOLIC PANEL
ANION GAP: 11 (ref 5–15)
BUN: 28 mg/dL — ABNORMAL HIGH (ref 6–20)
CALCIUM: 8.9 mg/dL (ref 8.9–10.3)
CHLORIDE: 100 mmol/L — AB (ref 101–111)
CO2: 33 mmol/L — ABNORMAL HIGH (ref 22–32)
CREATININE: 2.57 mg/dL — AB (ref 0.61–1.24)
GFR calc non Af Amer: 27 mL/min — ABNORMAL LOW (ref >60)
GFR, EST AFRICAN AMERICAN: 32 mL/min — AB (ref >60)
Glucose, Bld: 179 mg/dL — ABNORMAL HIGH (ref 65–99)
Potassium: 4 mmol/L (ref 3.5–5.1)
SODIUM: 144 mmol/L (ref 135–145)

## 2015-10-17 MED ORDER — HYDRALAZINE HCL 25 MG PO TABS: 25.0000 mg | ORAL_TABLET | Freq: Three times a day (TID) | ORAL | Status: DC

## 2015-10-17 MED ORDER — AMLODIPINE BESYLATE 5 MG PO TABS
5.0000 mg | ORAL_TABLET | Freq: Every day | ORAL | Status: DC
Start: 2015-10-17 — End: 2015-10-22
  Administered 2015-10-17 – 2015-10-22 (×6): 5 mg via ORAL
  Filled 2015-10-17 (×2): qty 1
  Filled 2015-10-17: qty 2
  Filled 2015-10-17 (×3): qty 1

## 2015-10-17 NOTE — Progress Notes (Addendum)
Inpatient Diabetes Program Recommendations  AACE/ADA: New Consensus Statement on Inpatient Glycemic Control (2015)  Target Ranges:  Prepandial:   less than 140 mg/dL      Peak postprandial:   less than 180 mg/dL (1-2 hours)      Critically ill patients:  140 - 180 mg/dL   Results for CLAYVON, ALPERN (MRN [id number]) as of 10/17/2015 09:40  Ref. Range 10/16/2015 07:51 10/16/2015 11:15 10/16/2015 16:17 10/16/2015 21:05 10/17/2015 02:11 10/17/2015 02:47 10/17/2015 06:11  Glucose-Capillary Latest Ref Range: 65-99 mg/dL 76 109 (H) 154 (H) 106 (H) 41 (LL) 77 167 (H)   Review of Glycemic Control  Diabetes history: DM 2 Outpatient Diabetes medications: Lantus 45 units QAM, 35 units QPM,  Current orders for Inpatient glycemic control: Lantus 35 units Daily, Novolog Sensitive + HS + Novolog 4 units meal coverage TID  Inpatient Diabetes Program Recommendations: Insulin - Basal: Patient did not have basal insulin at all yesterday on 2/15, patient still had hypoglycemia in the 40's this am. Please consider reducing basal insulin to 28 units (0.2units/kg) for todays dose.  Thanks,  Tama Headings RN, MSN, [facility] Inpatient Diabetes Coordinator Team Pager [phone] (8a-5p)

## 2015-10-17 NOTE — Progress Notes (Signed)
Subjective:  Pt says he still has SOB and edema  Objective:  Vital Signs in the last 24 hours: Temp:  [96.9 F (36.1 C)-99 F (37.2 C)] 98.1 F (36.7 C) (02/16 0814) Pulse Rate:  [71-88] 88 (02/16 0814) Resp:  [10-24] 18 (02/16 0814) BP: (121-213)/(73-107) 187/97 mmHg (02/16 0814) SpO2:  [91 %-99 %] 97 % (02/16 0814) Weight:  [307 lb 8 oz (139.481 kg)-308 lb 8 oz (139.935 kg)] 308 lb 8 oz (139.935 kg) (02/16 0518)  Intake/Output from previous day:  Intake/Output Summary (Last 24 hours) at 10/17/15 1021 Last data filed at 10/17/15 0932  Gross per 24 hour  Intake   1320 ml  Output   3000 ml  Net  -1680 ml    Physical Exam: General appearance: alert, cooperative, no distress and morbidly obese Lungs: decreased breath sounds Heart: regular rate and rhythm Extremities: 2+ pitting edema to knees   Rate: 88  Rhythm: normal sinus rhythm  Lab Results:  Recent Labs  10/15/15 0304 10/17/15 0540  WBC 8.4 7.0  HGB 13.3 13.1  PLT 190 162    Recent Labs  10/15/15 0304 10/17/15 0540  NA 144 144  K 4.0 4.0  CL 101 100*  CO2 31 33*  GLUCOSE 346* 179*  BUN 31* 28*  CREATININE 2.71* 2.57*   No results for input(s): TROPONINI in the last 72 hours.  Invalid input(s): CK, MB No results for input(s): INR in the last 72 hours.  Scheduled Meds: . amitriptyline  50 mg Oral QHS  . aspirin  325 mg Oral Daily  . atorvastatin  80 mg Oral Daily  . carvedilol  25 mg Oral BID WC  . cloNIDine  0.2 mg Oral TID  . furosemide  80 mg Oral BID  . gabapentin  300 mg Oral Daily  . gabapentin  600 mg Oral QHS  . heparin  5,000 Units Subcutaneous 3 times per day  . insulin aspart  0-5 Units Subcutaneous QHS  . insulin aspart  0-9 Units Subcutaneous TID WC  . insulin aspart  4 Units Subcutaneous TID WC  . insulin glargine  35 Units Subcutaneous Daily  . minoxidil  10 mg Oral BID  . potassium chloride  20 mEq Oral Daily   Continuous Infusions:  PRN Meds:.acetaminophen,  camphor-menthol, hydrALAZINE, hydrOXYzine, levalbuterol, morphine injection, nitroGLYCERIN, ondansetron (ZOFRAN) IV   Imaging: Imaging results have been reviewed  Cardiac Studies: Echo 10/14/15 Study Conclusions  - Left ventricle: The cavity size was normal. There was severe concentric hypertrophy. Systolic function was normal. The estimated ejection fraction was in the range of 60% to 65%. Wall motion was normal; there were no regional wall motion abnormalities. There was an increased relative contribution of atrial contraction to ventricular filling. Doppler parameters are consistent with abnormal left ventricular relaxation (grade 1 diastolic dysfunction). - Right ventricle: The cavity size was mildly dilated. Wall thickness was normal.   Assessment/Plan:  50 yo obese Caucasian male with PMH HTN, Hyerlipidemia, renal insufficiency, DM, and substance abuse, seen 10/13/15 in ED for acute on chronic diastolic CHF. UDS positive for cocaine. He has diuresed 4.4 L, but his wgt is only down 2 lbs.   Principal Problem:   Acute on chronic diastolic CHF (congestive heart failure), NYHA class 4 (HCC) Active Problems:   Acute respiratory failure with hypoxia (HCC)   Obstructive sleep apnea   Acute on chronic renal insufficiency (HCC)   Cocaine abuse   Essential hypertension, malignant   CKD (chronic kidney  disease), stage III   Type 2 diabetes, uncontrolled, with neuropathy ([place])   Hyperlipemia   Legally blind   Obesity hypoventilation syndrome ([place])   Marijuana abuse   Tobacco abuse   PLAN: Consider addition of metolazone to Lasix. His renal function appears to be at baseline. MD to see.   Kerin Ransom PA-C 10/17/2015, 10:21 AM [phone]  I have seen and examined the patient along with Kerin Ransom PA-C. I have reviewed the chart, notes and new data.  I agree with PA's note.  PLAN: I had hoped minoxidil in higher dose would provide sufficient vasodilation. Will  start amlodipine, preferable to hydralazine due to simpler dosing schedule. Increase loop diuretic.  Sanda Klein, MD, [place] [phone] 10/17/2015, 12:12 PM

## 2015-10-17 NOTE — Progress Notes (Signed)
Hypoglycemic Event  CBG: 41  Treatment: 15 GM carbohydrate snack  Symptoms: None Pt states he just felt different and can tell when it's low but didn't have none of the prime s/s  Follow-up CBG: Time:2:49 AM CBG Result:77  Possible Reasons for Event: Medication Regimen  Comments/MD notified:Notified Hospitalist via text page.    [place]

## 2015-10-17 NOTE — Progress Notes (Signed)
PROGRESS NOTE  Jerry Cantrell [id number] DOB: [birth year] DOA: 10/13/2015 PCP: Helane Rima, MD  Admit HPI / Brief Narrative: 50 y.o. male with a history of combined systolic and diastolic heart failure, DM2, OSA on BIPAP, and CKD stage III who presented w/ a 1 week history of progressive shortness of breath and pedal edema. He reported increasing his Lasix to 80 mg by mouth twice a day without significant improvement in his volume status.  In the ED he was noted to have a BP of 182/98, normal pulse and respirations, and saturation 98 on BiPAP.  HPI/Subjective: Still complaining about leg swelling and rash all over him.  Assessment/Plan:  Acute respiratory failure with hypoxemia due to acute pulmonary edema Presented with acute pulmonary edema likely secondary to diastolic CHF and CKD stage III. This is improving but patient is still require supplemental oxygen. Continue diuresis.  Malignant Hypertension Difficult to control blood pressure, appreciate cardiology's help. Patient is fluid overloaded, likely volume is contributing to the difficult to control blood pressure I have asked nephrology to evaluate him.  Acute on chronic diastolic CHF  Decompensation due to cocaine abuse + HTN - Cardiology following - net negative 3.3L thus far   Chronic kidney disease stage III This is around baseline, on high dose of Lasix at home at 160 twice a day. I have asked nephrology to evaluate him.  DM2 w/ diabetic neuropathy  A1C 8.9 on 11/2013 - CBG now very well controlled, w/ some hypoglycemia early this morning - reduce insulin dose and follow   Cocaine abuse  patient has been counseled on the absolute requirement that he discontinue cocaine use completely  Dysuria  UA not c/w UTI  Tobacco abuse patient has been counseled on the absolute need to discontinue tobacco abuse  Obesity - Body mass index is 40.71 kg/(m^2).   OSA/obesity Hypoventilation syndrome -Continue BiPAP at  night and w/ naps   Diffuse rash  -no clear etiology on exam - is subacute - distribution not c/w scabies - lesions not suggestive of bed bugs - atarax prn - consider outpt Derm eval for bx if persists    Code Status: FULL Family Communication: no family present at time of exam Disposition Plan: stable for transfer to tele bed - cont diuresis as renal fxn allows   Consultants: [facility]   Procedures: TTE - EF 60-65% - no WMA - grade 1 DD  Antibiotics: Azithromycin 2/12 > 2/13 Ceftriaxone 2/12 > 2/13  DVT prophylaxis: SQ heparin   Objective: Blood pressure 194/100, pulse 87, temperature 98.2 F (36.8 C), temperature source Oral, resp. rate 18, height 6\' 1"  (1.854 m), weight 139.935 kg (308 lb 8 oz), SpO2 96 %.  Intake/Output Summary (Last 24 hours) at 10/17/15 1252 Last data filed at 10/17/15 1059  Gross per 24 hour  Intake   1320 ml  Output   3250 ml  Net  -1930 ml   Exam: General: No acute respiratory distress evident Lungs: mild bibasilar crackles persist - no wheezing  Cardiovascular: Regular rate and rhythm without murmur gallop or rub = Abdomen: Nontender, obese, soft, bowel sounds positive, no rebound, no ascites, no appreciable mass Extremities: No significant cyanosis, or clubbing;  1+ edema bilateral lower extremities persists  Data Reviewed:  Basic Metabolic Panel:  Recent Labs Lab 10/13/15 1130 10/13/15 1141 10/14/15 0217 10/15/15 0304 10/17/15 0540  NA 144 143 146* 144 144  K 4.8 4.6 4.0 4.0 4.0  CL 103 103 103 101 100*  CO2 29  --  30 31 33*  GLUCOSE 324* 317* 221* 346* 179*  BUN 30* 36* 28* 31* 28*  CREATININE 2.61* 2.40* 2.50* 2.71* 2.57*  CALCIUM 8.8*  --  9.0 8.9 8.9    CBC:  Recent Labs Lab 10/13/15 1130 10/13/15 1141 10/14/15 0217 10/15/15 0304 10/17/15 0540  WBC 7.2  --  15.6* 8.4 7.0  NEUTROABS  --   --  13.8*  --   --   HGB 14.0 15.6 14.0 13.3 13.1  HCT 44.9 46.0 46.0 45.2 43.0  MCV 93.3  --  93.7 95.2 93.5  PLT  188  --  209 190 162    Liver Function Tests:  Recent Labs Lab 10/15/15 0304  AST 11*  ALT 10*  ALKPHOS 110  BILITOT 0.4  PROT 5.8*  ALBUMIN 2.2*    Cardiac Enzymes:  Recent Labs Lab 10/13/15 1711 10/13/15 1854 10/13/15 2220  TROPONINI 0.05* 0.06* 0.05*    CBG:  Recent Labs Lab 10/16/15 2105 10/17/15 0211 10/17/15 0247 10/17/15 0611 10/17/15 1157  GLUCAP 106* 41* 77 167* 268*    Recent Results (from the past 240 hour(s))  Culture, blood (routine x 2) Call MD if unable to obtain prior to antibiotics being given     Status: None (Preliminary result)   Collection Time: 10/13/15  4:35 PM  Result Value Ref Range Status   Specimen Description BLOOD RIGHT ANTECUBITAL  Final   Special Requests BOTTLES DRAWN AEROBIC AND ANAEROBIC 5CC  Final   Culture NO GROWTH 4 DAYS  Final   Report Status PENDING  Incomplete  Culture, blood (routine x 2) Call MD if unable to obtain prior to antibiotics being given     Status: None (Preliminary result)   Collection Time: 10/13/15  4:40 PM  Result Value Ref Range Status   Specimen Description BLOOD RIGHT HAND  Final   Special Requests BOTTLES DRAWN AEROBIC AND ANAEROBIC 5CC  Final   Culture NO GROWTH 4 DAYS  Final   Report Status PENDING  Incomplete  MRSA PCR Screening     Status: None   Collection Time: 10/13/15  6:38 PM  Result Value Ref Range Status   MRSA by PCR NEGATIVE NEGATIVE Final    Comment:        The GeneXpert MRSA Assay (FDA approved for NASAL specimens only), is one component of a comprehensive MRSA colonization surveillance program. It is not intended to diagnose MRSA infection nor to guide or monitor treatment for MRSA infections.      Studies:   Recent x-ray studies have been reviewed in detail by the Attending Physician  Scheduled Meds:  Scheduled Meds: . amitriptyline  50 mg Oral QHS  . amLODipine  5 mg Oral Daily  . aspirin  325 mg Oral Daily  . atorvastatin  80 mg Oral Daily  . carvedilol  25  mg Oral BID WC  . cloNIDine  0.2 mg Oral TID  . furosemide  80 mg Oral BID  . gabapentin  300 mg Oral Daily  . gabapentin  600 mg Oral QHS  . heparin  5,000 Units Subcutaneous 3 times per day  . insulin aspart  0-5 Units Subcutaneous QHS  . insulin aspart  0-9 Units Subcutaneous TID WC  . insulin aspart  4 Units Subcutaneous TID WC  . insulin glargine  35 Units Subcutaneous Daily  . minoxidil  10 mg Oral BID  . potassium chloride  20 mEq Oral Daily    Time spent on care of this patient:  35 mins   Birdie Hopes , MD   [facility] Office  [phone] Pager - Text Page per Amion as per below:  On-Call/Text Page:      [email or web address]      password [email or web address]  If 7PM-7AM, please contact night-coverage [email or web address] Password [email or web address] 10/17/2015, 12:52 PM   LOS: 4 days

## 2015-10-17 NOTE — Consult Note (Signed)
Referring Provider: No ref. provider found Primary Care Physician:  Helane Rima, MD Primary Nephrologist:  Dr. Justin Mend  Reason for Consultation:   Congestive heart failure  Volume overload  Chronic renal failure   HPI:  50 year old   Diabetes x 20years  Baseline creatinine 2 range  Was admitted with congestive heart failure and volume overload , 2 D Echo diastolic heart failure was taking 80 mg BID .  Started having symptoms of fluid overload with shortness of breath x 1 week. No change in diet. Weight increased 138kg on admission.  He has responded to diuresis with urine output of 2 -3 L per day . Creatinine appears stable with diuresis . I wonder if he is non compliant with his fluid restriction.   Past Medical History  Diagnosis Date  . Hypertension   . Meralgia paraesthetica 05/03/2012  . Lumbar spondylosis 05/03/2012  . Lumbar spinal stenosis 05/03/2012    Mild with only right L4 nerve root encroachment, no neurogenic claudication   . Urinary incontinence 05/12/2012    Since the start of Sept. 2013   . Hemorrhoids 05/12/2012  . Meralgia paraesthetica 05/03/2012    On Lyrica which does improve pain.   Marland Kitchen Neuropathy in diabetes ([place]) 05/12/2012  . Substance abuse   . Hyperlipidemia   . Pneumonia   . Diabetes mellitus with nephropathy ([place]) 05/12/2012  . Acute diastolic heart failure ([place])   . Retinopathy   . CHF (congestive heart failure) ([place])   . Obesity     Past Surgical History  Procedure Laterality Date  . Tonsillectomy    . Abdominal surgery      Abscess I&D 2/2 infected hair  . Colonoscopy w/ polypectomy      pt to bring records    Prior to Admission medications   Medication Sig Start Date End Date Taking? Authorizing Provider  albuterol (PROVENTIL HFA;VENTOLIN HFA) 108 (90 BASE) MCG/ACT inhaler Inhale 2 puffs into the lungs every 4 (four) hours as needed for wheezing or shortness of breath. 02/05/15  Yes Tiffany Carlota Raspberry, PA-C  amitriptyline (ELAVIL) 50 MG tablet Take 1 tablet  (50 mg total) by mouth at bedtime. 02/05/15  Yes Tiffany Carlota Raspberry, PA-C  aspirin 325 MG tablet Take 1 tablet (325 mg total) by mouth daily. 02/05/15  Yes Tiffany Carlota Raspberry, PA-C  atorvastatin (LIPITOR) 80 MG tablet Take 1 tablet (80 mg total) by mouth daily. 05/18/15  Yes Patrecia Pour, MD  carvedilol (COREG) 25 MG tablet Take 1 tablet (25 mg total) by mouth 2 (two) times daily with a meal. 02/05/15  Yes Delos Haring, PA-C  cloNIDine (CATAPRES) 0.2 MG tablet Take 1 tablet (0.2 mg total) by mouth 3 (three) times daily. Patient taking differently: Take 0.2 mg by mouth 2 (two) times daily.  02/05/15  Yes Tiffany Carlota Raspberry, PA-C  furosemide (LASIX) 80 MG tablet TAKE ONE (1) TABLET BY MOUTH TWO (2) TIMES DAILY Patient taking differently: TAKE ONE (1) TABLET BY MOUTH ONCE DAILY, THEN CAN TAKE 1 ADDT'L TAB AS NEEDED FOR INCREASED EDEMA/FLUID 10/03/15  Yes Rigoberto Noel, MD  gabapentin (NEURONTIN) 300 MG capsule Take 1 capsule (300 mg total) by mouth 3 (three) times daily. 200 mg in the morning and 100 mg in the evening. Patient taking differently: Take 300-600 mg by mouth 2 (two) times daily. 600 mg in the morning and 300 mg in the evening. 02/05/15  Yes Tiffany Carlota Raspberry, PA-C  hydrALAZINE (APRESOLINE) 25 MG tablet Take 25 mg by mouth 3 (three) times  daily.   Yes Historical Provider, MD  insulin aspart (NOVOLOG) 100 UNIT/ML injection Inject into the skin. Inject into the skin as directed based on sliding scale daily   Yes Historical Provider, MD  insulin glargine (LANTUS) 100 UNIT/ML injection Inject into the skin 2 (two) times daily. Inject 45 units into the skin daily in the morning. Inject 35 units into the skin daily at night   Yes Historical Provider, MD  minoxidil (LONITEN) 2.5 MG tablet Take 2 tablets (5 mg total) by mouth daily. 09/23/15  Yes Fay Records, MD  nitroGLYCERIN (NITROSTAT) 0.4 MG SL tablet Place 1 tablet (0.4 mg total) under the tongue every 5 (five) minutes as needed for chest pain. 07/18/15  Yes Brittainy Erie Noe, PA-C  Polyvinyl Alcohol-Povidone (REFRESH OP) Place 2 drops into both eyes 3 (three) times daily as needed (dry eyes).   Yes Historical Provider, MD  potassium chloride 20 MEQ TBCR take 2 tabs (52mEq) for 6 days, then 1 tab (48mEq) daily Patient taking differently: Take 20 mEq by mouth daily.  05/18/15  Yes Patrecia Pour, MD  verapamil (CALAN-SR) 120 MG CR tablet Take 1 tablet (120 mg total) by mouth at bedtime. 02/05/15  Yes Delos Haring, PA-C    Current Facility-Administered Medications  Medication Dose Route Frequency Provider Last Rate Last Dose  . acetaminophen (TYLENOL) tablet 650 mg  650 mg Oral Q4H PRN Rondel Jumbo, PA-C      . amitriptyline (ELAVIL) tablet 50 mg  50 mg Oral QHS Willia Craze, NP   50 mg at 10/16/15 2249  . amLODipine (NORVASC) tablet 5 mg  5 mg Oral Daily Mihai Croitoru, MD   5 mg at 10/17/15 1306  . aspirin tablet 325 mg  325 mg Oral Daily Willia Craze, NP   325 mg at 10/17/15 1056  . atorvastatin (LIPITOR) tablet 80 mg  80 mg Oral Daily Willia Craze, NP   80 mg at 10/17/15 1055  . camphor-menthol (SARNA) lotion   Topical PRN Rhetta Mura Schorr, NP      . carvedilol (COREG) tablet 25 mg  25 mg Oral BID WC Willia Craze, NP   25 mg at 10/17/15 1055  . cloNIDine (CATAPRES) tablet 0.2 mg  0.2 mg Oral TID Cherene Altes, MD   0.2 mg at 10/17/15 1055  . furosemide (LASIX) tablet 80 mg  80 mg Oral BID Mihai Croitoru, MD   80 mg at 10/17/15 1055  . gabapentin (NEURONTIN) capsule 300 mg  300 mg Oral Daily Lauren D Bajbus, RPH   300 mg at 10/17/15 1055  . gabapentin (NEURONTIN) tablet 600 mg  600 mg Oral QHS Lauren D Bajbus, RPH   600 mg at 10/16/15 2249  . heparin injection 5,000 Units  5,000 Units Subcutaneous 3 times per day Rondel Jumbo, PA-C   5,000 Units at 10/17/15 1306  . hydrALAZINE (APRESOLINE) injection 20 mg  20 mg Intravenous Q6H PRN Hewitt Shorts Harduk, PA-C   20 mg at 10/14/15 0757  . hydrOXYzine (ATARAX/VISTARIL) tablet 25 mg  25 mg Oral  TID PRN Cherene Altes, MD   25 mg at 10/16/15 2249  . insulin aspart (novoLOG) injection 0-5 Units  0-5 Units Subcutaneous QHS Cherene Altes, MD   0 Units at 10/16/15 2247  . insulin aspart (novoLOG) injection 0-9 Units  0-9 Units Subcutaneous TID WC Cherene Altes, MD   5 Units at 10/17/15 1226  . insulin aspart (novoLOG)  injection 4 Units  4 Units Subcutaneous TID WC Cherene Altes, MD   4 Units at 10/17/15 1226  . insulin glargine (LANTUS) injection 35 Units  35 Units Subcutaneous Daily Cherene Altes, MD   35 Units at 10/17/15 1056  . levalbuterol (XOPENEX) nebulizer solution 0.63 mg  0.63 mg Nebulization Q3H PRN Cherene Altes, MD      . minoxidil (LONITEN) tablet 10 mg  10 mg Oral BID Sanda Klein, MD   10 mg at 10/17/15 1056  . morphine 2 MG/ML injection 2 mg  2 mg Intravenous Q3H PRN Hewitt Shorts Harduk, PA-C      . nitroGLYCERIN (NITROSTAT) SL tablet 0.4 mg  0.4 mg Sublingual Q5 min PRN Deno Etienne, DO   0.4 mg at 10/13/15 1143  . ondansetron (ZOFRAN) injection 4 mg  4 mg Intravenous Q6H PRN Rondel Jumbo, PA-C   4 mg at 10/14/15 0117  . potassium chloride (K-DUR,KLOR-CON) CR tablet 20 mEq  20 mEq Oral Daily Willia Craze, NP   20 mEq at 10/17/15 1056    Allergies as of 10/13/2015  . (No Known Allergies)    Family History  Problem Relation Age of Onset  . Prostate cancer Father   . Alcohol abuse Father   . Emphysema Father     smoked    Social History   Social History  . Marital Status: Single    Spouse Name: N/A  . Number of Children: N/A  . Years of Education: N/A   Occupational History  . dietary services [employer]   Social History Main Topics  . Smoking status: Current Every Day Smoker -- 0.40 packs/day for 30 years    Types: Cigarettes  . Smokeless tobacco: Never Used     Comment: "Trying - hard to stop"  . Alcohol Use: 3.6 oz/week    6 Cans of beer per week     Comment: beer  . Drug Use: Yes    Special: Marijuana, Cocaine     Comment:  marijuana "laced with something"  . Sexual Activity: Not on file   Other Topics Concern  . Not on file   Social History Narrative    Review of Systems: Gen: +  anorexia, fatigue, weakness,  HEENT: No visual complaints, No history of Retinopathy. Normal external appearance No Epistaxis or Sore throat. No sinusitis.   CV: + orthopnea, PND, peripheral edema, Resp:  dyspnea with exercise and cough,  GI: Denies vomiting blood, jaundice, and fecal incontinence.   Denies dysphagia or odynophagia. GU : Denies urinary burning, blood in urine, urinary frequency, urinary hesitancy, nocturnal urination, and urinary incontinence.  No renal calculi. MS: Denies joint pain, limitation of movement, and swelling, stiffness, low back pain, extremity pain. Denies muscle weakness, cramps, atrophy.  No use of non steroidal antiinflammatory drugs. Derm:  + rash, itching,  Psych: Denies depression, anxiety, memory loss, suicidal ideation, hallucinations, paranoia, and confusion. Heme: Denies bruising, bleeding, and enlarged lymph nodes. Neuro: No headache.  No diplopia. No dysarthria.  No dysphasia.  No history of CVA.  No Seizures. No paresthesias.  No weakness. Endocrine  DM.  No Thyroid disease.  No Adrenal disease.  Physical Exam: Vital signs in last 24 hours: Temp:  [98 F (36.7 C)-99 F (37.2 C)] 98.2 F (36.8 C) (02/16 1123) Pulse Rate:  [76-88] 87 (02/16 1123) Resp:  [12-24] 18 (02/16 1123) BP: (121-194)/(73-100) 150/78 mmHg (02/16 1309) SpO2:  [91 %-98 %] 96 % (02/16 1123) Weight:  [  139.481 kg (307 lb 8 oz)-139.935 kg (308 lb 8 oz)] 139.935 kg (308 lb 8 oz) (02/16 0518) Last BM Date: 10/13/15 General:   Obese and ill appearing Head:  Normocephalic and atraumatic. Eyes:  Sclera clear, no icterus.   Conjunctiva pink. Ears:  Normal auditory acuity. Nose:  No deformity, discharge,  or lesions. Mouth:  No deformity or lesions, dentition normal. Neck:  Supple; no masses or thyromegaly. JVP not  elevated Lungs:   + , crackles, no rhonchi. No acute distress. Heart:  Regular rate and rhythm; no murmurs, clicks, rubs,  or gallops. Abdomen:  Soft, nontender and nondistended. No masses, hepatosplenomegaly or hernias noted. Normal bowel sounds, without guarding, and without rebound.   Msk:  Symmetrical without gross deformities. Normal posture. Pulses:  No carotid, renal, femoral bruits. DP and PT symmetrical and equal Extremities:  Without clubbing or edema. Neurologic:  Alert and  oriented x4;  grossly normal neurologically. Skin:  Intact without significant lesions or rashes. Cervical Nodes:  No significant cervical adenopathy. Psych:  Alert and cooperative. Normal mood and affect.  Intake/Output from previous day: 02/15 0701 - 02/16 0700 In: 1200 [P.O.:1200] Out: 1875 [Urine:1875] Intake/Output this shift: Total I/O In: 600 [P.O.:600] Out: 2175 [Urine:2175]  Lab Results:  Recent Labs  10/15/15 0304 10/17/15 0540  WBC 8.4 7.0  HGB 13.3 13.1  HCT 45.2 43.0  PLT 190 162   BMET  Recent Labs  10/15/15 0304 10/17/15 0540  NA 144 144  K 4.0 4.0  CL 101 100*  CO2 31 33*  GLUCOSE 346* 179*  BUN 31* 28*  CREATININE 2.71* 2.57*  CALCIUM 8.9 8.9   LFT  Recent Labs  10/15/15 0304  PROT 5.8*  ALBUMIN 2.2*  AST 11*  ALT 10*  ALKPHOS 110  BILITOT 0.4   PT/INR No results for input(s): LABPROT, INR in the last 72 hours. Hepatitis Panel No results for input(s): HEPBSAG, HCVAB, HEPAIGM, HEPBIGM in the last 72 hours.  Studies/Results: No results found.  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 Volume overload with challenging exam due to body habitus would check CXR in AM  Anemia stable  Lipids controlled   LOS: 4 Aileene Lanum,[place] W @TODAY @2 :48 PM

## 2015-10-18 ENCOUNTER — Inpatient Hospital Stay (HOSPITAL_COMMUNITY): Payer: Medicaid Other

## 2015-10-18 DIAGNOSIS — E1140 Type 2 diabetes mellitus with diabetic neuropathy, unspecified: Secondary | ICD-10-CM

## 2015-10-18 DIAGNOSIS — H548 Legal blindness, as defined in USA: Secondary | ICD-10-CM

## 2015-10-18 DIAGNOSIS — E1165 Type 2 diabetes mellitus with hyperglycemia: Secondary | ICD-10-CM

## 2015-10-18 DIAGNOSIS — J810 Acute pulmonary edema: Secondary | ICD-10-CM

## 2015-10-18 DIAGNOSIS — J9601 Acute respiratory failure with hypoxia: Secondary | ICD-10-CM

## 2015-10-18 LAB — BASIC METABOLIC PANEL
Anion gap: 10 (ref 5–15)
BUN: 32 mg/dL — ABNORMAL HIGH (ref 6–20)
CALCIUM: 8.8 mg/dL — AB (ref 8.9–10.3)
CHLORIDE: 99 mmol/L — AB (ref 101–111)
CO2: 36 mmol/L — AB (ref 22–32)
CREATININE: 2.7 mg/dL — AB (ref 0.61–1.24)
GFR calc non Af Amer: 26 mL/min — ABNORMAL LOW (ref >60)
GFR, EST AFRICAN AMERICAN: 30 mL/min — AB (ref >60)
GLUCOSE: 143 mg/dL — AB (ref 65–99)
Potassium: 3.6 mmol/L (ref 3.5–5.1)
Sodium: 145 mmol/L (ref 135–145)

## 2015-10-18 LAB — CULTURE, BLOOD (ROUTINE X 2)
Culture: NO GROWTH
Culture: NO GROWTH

## 2015-10-18 LAB — GLUCOSE, CAPILLARY
GLUCOSE-CAPILLARY: 297 mg/dL — AB (ref 65–99)
Glucose-Capillary: 131 mg/dL — ABNORMAL HIGH (ref 65–99)
Glucose-Capillary: 258 mg/dL — ABNORMAL HIGH (ref 65–99)
Glucose-Capillary: 270 mg/dL — ABNORMAL HIGH (ref 65–99)

## 2015-10-18 MED ORDER — INSULIN ASPART 100 UNIT/ML ~~LOC~~ SOLN
7.0000 [IU] | Freq: Three times a day (TID) | SUBCUTANEOUS | Status: DC
  Administered 2015-10-18 – 2015-10-21 (×10): 7 [IU] via SUBCUTANEOUS

## 2015-10-18 MED ORDER — FLUTICASONE PROPIONATE 50 MCG/ACT NA SUSP
2.0000 | Freq: Every day | NASAL | Status: DC
  Administered 2015-10-18 – 2015-10-22 (×5): 2 via NASAL
  Filled 2015-10-18 (×3): qty 16

## 2015-10-18 MED ORDER — CLONIDINE HCL 0.3 MG PO TABS
0.3000 mg | ORAL_TABLET | Freq: Three times a day (TID) | ORAL | Status: DC
  Administered 2015-10-18 – 2015-10-22 (×12): 0.3 mg via ORAL
  Filled 2015-10-18 (×13): qty 1

## 2015-10-18 NOTE — Progress Notes (Signed)
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 Might consider stopping minoxidil due to potent sodium retentive properties.        .   Subjective: Interval History: Good UOP, anxious  Objective: Vital signs in last 24 hours: Temp:  [97.7 F (36.5 C)-98.4 F (36.9 C)] 98.3 F (36.8 C) (02/17 1215) Pulse Rate:  [74-84] 84 (02/17 1215) Resp:  [20] 20 (02/17 1215) BP: (133-170)/(72-87) 133/75 mmHg (02/17 1215) SpO2:  [97 %-98 %] 98 % (02/17 1215) Weight:  [137.395 kg (302 lb 14.4 oz)] 137.395 kg (302 lb 14.4 oz) (02/17 0502) Weight change: -2.087 kg (-4 lb 9.6 oz)  Intake/Output from previous day: 02/16 0701 - 02/17 0700 In: 1060 [P.O.:1060] Out: 3825 [Urine:3825] Intake/Output this shift: Total I/O In: 720 [P.O.:720] Out: 450 [Urine:450]  General appearance: alert and cooperative Resp: diminished breath sounds bibasilar Chest wall: no tenderness Cardio: regular rate and rhythm, S1, S2 normal, no murmur, click, rub or gallop Extremities: edema 2-3+ bilat  Lab Results:  Recent Labs  10/17/15 0540  WBC 7.0  HGB 13.1  HCT 43.0  PLT 162   BMET:  Recent Labs  10/17/15 0540 10/18/15 0420  NA 144 145  K 4.0 3.6  CL 100* 99*  CO2 33* 36*  GLUCOSE 179* 143*  BUN 28* 32*  CREATININE 2.57* 2.70*  CALCIUM 8.9 8.8*   No results for input(s): PTH in the last 72 hours. Iron Studies: No results for input(s): IRON, TIBC, TRANSFERRIN, FERRITIN in the last 72 hours. Studies/Results: Dg Chest 2 View  10/18/2015  CLINICAL DATA:  Pulmonary edema EXAM: CHEST  2 VIEW COMPARISON:  10/14/2015 FINDINGS: Cardiomegaly again noted. Central mild vascular congestion without convincing pulmonary edema. Persistent small right pleural effusion right basilar atelectasis or infiltrate. Mild left basilar atelectasis. Slight improvement in aeration. IMPRESSION:  Central mild vascular congestion without convincing pulmonary edema. Slight improvement in aeration. Persistent small right pleural effusion with right basilar atelectasis or infiltrate. Mild left basilar atelectasis. Electronically Signed   By: Lahoma Crocker M.D.   On: 10/18/2015 09:31    Scheduled: . amitriptyline  50 mg Oral QHS  . amLODipine  5 mg Oral Daily  . aspirin  325 mg Oral Daily  . atorvastatin  80 mg Oral Daily  . carvedilol  25 mg Oral BID WC  . cloNIDine  0.3 mg Oral TID  . fluticasone  2 spray Each Nare Daily  . furosemide  80 mg Oral BID  . gabapentin  300 mg Oral Daily  . gabapentin  600 mg Oral QHS  . heparin  5,000 Units Subcutaneous 3 times per day  . insulin aspart  0-9 Units Subcutaneous TID WC  . insulin aspart  7 Units Subcutaneous TID WC  . insulin glargine  35 Units Subcutaneous Daily  . minoxidil  10 mg Oral BID  . potassium chloride  20 mEq Oral Daily     LOS: 5 days   Tressie Ragin C 10/18/2015,2:36 PM

## 2015-10-18 NOTE — Progress Notes (Signed)
Pt wants Flonase and says he takes it at home.  MD notified.

## 2015-10-18 NOTE — Progress Notes (Signed)
Patient refused CPAP for tonight. CPAP in room from and pt wore last night. Patient stated that he felt like he was smothering and did not wish to wear it again tonight. RT made patient aware that if he changed his mind to call RT.

## 2015-10-18 NOTE — Progress Notes (Signed)
PROGRESS NOTE  Dre Forgacs [id number] DOB: [birth year] DOA: 10/13/2015 PCP: Helane Rima, MD  Admit HPI / Brief Narrative: 50 y.o. male with a history of combined systolic and diastolic heart failure, DM2, OSA on BIPAP, and CKD stage III who presented w/ a 1 week history of progressive shortness of breath and pedal edema. He reported increasing his Lasix to 80 mg by mouth twice a day without significant improvement in his volume status.  In the ED he was noted to have a BP of 182/98, normal pulse and respirations, and saturation 98 on BiPAP.  HPI/Subjective: Very concerned about his rash, so itchy, all his rash is scratching from the itchiness. High blood pressure towards night and early morning consistent with OSA, not very compliant with CPAP.  Assessment/Plan:  Acute respiratory failure with hypoxemia due to acute pulmonary edema Presented with acute pulmonary edema likely secondary to diastolic CHF and CKD stage III. This is improving but patient is still require supplemental oxygen. Continue diuresis.  Malignant Hypertension Difficult to control blood pressure, appreciate cardiology's help. Patient is fluid overloaded, likely volume is contributing to the difficult to control blood pressure I think volume overload and OSA contributing side-by-side to his malignant hypertension. Continue diuresis and CPAP.  Acute on chronic diastolic CHF  Decompensation due to cocaine abuse + HTN - Cardiology following. Chest x-ray showed improvement.  Chronic kidney disease stage III This is around baseline, on high dose of Lasix at home at 160 twice a day. I have asked nephrology to evaluate him.  DM2 w/ diabetic neuropathy  A1C 8.9 on 11/2013 - CBG now very well controlled, w/ some hypoglycemia early this morning - reduce insulin dose and follow   Cocaine abuse  patient has been counseled on the absolute requirement that he discontinue cocaine use completely  Dysuria  UA not  c/w UTI  Tobacco abuse patient has been counseled on the absolute need to discontinue tobacco abuse  Obesity - Body mass index is 39.97 kg/(m^2).   OSA/obesity Hypoventilation syndrome -Continue BiPAP at night and w/ naps   Diffuse rash  -no clear etiology on exam - is subacute - distribution not c/w scabies - lesions not suggestive of bed bugs - atarax prn - consider outpt Derm eval for bx if persists    Code Status: FULL Family Communication: no family present at time of exam Disposition Plan: stable for transfer to tele bed - cont diuresis as renal fxn allows   Consultants: [facility]   Procedures: TTE - EF 60-65% - no WMA - grade 1 DD  Antibiotics: Azithromycin 2/12 > 2/13 Ceftriaxone 2/12 > 2/13  DVT prophylaxis: SQ heparin   Objective: Blood pressure 170/87, pulse 81, temperature 97.8 F (36.6 C), temperature source Oral, resp. rate 20, height 6\' 1"  (1.854 m), weight 137.395 kg (302 lb 14.4 oz), SpO2 98 %.  Intake/Output Summary (Last 24 hours) at 10/18/15 1214 Last data filed at 10/18/15 1046  Gross per 24 hour  Intake   1180 ml  Output   2600 ml  Net  -1420 ml   Exam: General: No acute respiratory distress evident Lungs: mild bibasilar crackles persist - no wheezing  Cardiovascular: Regular rate and rhythm without murmur gallop or rub = Abdomen: Nontender, obese, soft, bowel sounds positive, no rebound, no ascites, no appreciable mass Extremities: No significant cyanosis, or clubbing;  1+ edema bilateral lower extremities persists  Data Reviewed:  Basic Metabolic Panel:  Recent Labs Lab 10/13/15 1130 10/13/15 1141 10/14/15 0217 10/15/15  0304 10/17/15 0540 10/18/15 0420  NA 144 143 146* 144 144 145  K 4.8 4.6 4.0 4.0 4.0 3.6  CL 103 103 103 101 100* 99*  CO2 29  --  30 31 33* 36*  GLUCOSE 324* 317* 221* 346* 179* 143*  BUN 30* 36* 28* 31* 28* 32*  CREATININE 2.61* 2.40* 2.50* 2.71* 2.57* 2.70*  CALCIUM 8.8*  --  9.0 8.9 8.9 8.8*     CBC:  Recent Labs Lab 10/13/15 1130 10/13/15 1141 10/14/15 0217 10/15/15 0304 10/17/15 0540  WBC 7.2  --  15.6* 8.4 7.0  NEUTROABS  --   --  13.8*  --   --   HGB 14.0 15.6 14.0 13.3 13.1  HCT 44.9 46.0 46.0 45.2 43.0  MCV 93.3  --  93.7 95.2 93.5  PLT 188  --  209 190 162    Liver Function Tests:  Recent Labs Lab 10/15/15 0304  AST 11*  ALT 10*  ALKPHOS 110  BILITOT 0.4  PROT 5.8*  ALBUMIN 2.2*    Cardiac Enzymes:  Recent Labs Lab 10/13/15 1711 10/13/15 1854 10/13/15 2220  TROPONINI 0.05* 0.06* 0.05*    CBG:  Recent Labs Lab 10/17/15 1827 10/17/15 2047 10/17/15 2224 10/18/15 0616 10/18/15 1146  GLUCAP 333* 273* 295* 131* 258*    Recent Results (from the past 240 hour(s))  Culture, blood (routine x 2) Call MD if unable to obtain prior to antibiotics being given     Status: None (Preliminary result)   Collection Time: 10/13/15  4:35 PM  Result Value Ref Range Status   Specimen Description BLOOD RIGHT ANTECUBITAL  Final   Special Requests BOTTLES DRAWN AEROBIC AND ANAEROBIC 5CC  Final   Culture NO GROWTH 4 DAYS  Final   Report Status PENDING  Incomplete  Culture, blood (routine x 2) Call MD if unable to obtain prior to antibiotics being given     Status: None (Preliminary result)   Collection Time: 10/13/15  4:40 PM  Result Value Ref Range Status   Specimen Description BLOOD RIGHT HAND  Final   Special Requests BOTTLES DRAWN AEROBIC AND ANAEROBIC 5CC  Final   Culture NO GROWTH 4 DAYS  Final   Report Status PENDING  Incomplete  MRSA PCR Screening     Status: None   Collection Time: 10/13/15  6:38 PM  Result Value Ref Range Status   MRSA by PCR NEGATIVE NEGATIVE Final    Comment:        The GeneXpert MRSA Assay (FDA approved for NASAL specimens only), is one component of a comprehensive MRSA colonization surveillance program. It is not intended to diagnose MRSA infection nor to guide or monitor treatment for MRSA infections.       Studies:   Recent x-ray studies have been reviewed in detail by the Attending Physician  Scheduled Meds:  Scheduled Meds: . amitriptyline  50 mg Oral QHS  . amLODipine  5 mg Oral Daily  . aspirin  325 mg Oral Daily  . atorvastatin  80 mg Oral Daily  . carvedilol  25 mg Oral BID WC  . cloNIDine  0.3 mg Oral TID  . fluticasone  2 spray Each Nare Daily  . furosemide  80 mg Oral BID  . gabapentin  300 mg Oral Daily  . gabapentin  600 mg Oral QHS  . heparin  5,000 Units Subcutaneous 3 times per day  . insulin aspart  0-5 Units Subcutaneous QHS  . insulin aspart  0-9 Units  Subcutaneous TID WC  . insulin aspart  4 Units Subcutaneous TID WC  . insulin glargine  35 Units Subcutaneous Daily  . minoxidil  10 mg Oral BID  . potassium chloride  20 mEq Oral Daily    Time spent on care of this patient: 35 mins   Verlee Monte A , MD   [facility] Office  [phone] Pager - Text Page per Shea Evans as per below:  On-Call/Text Page:      [email or web address]      password [email or web address]  If 7PM-7AM, please contact night-coverage [email or web address] Password [email or web address] 10/18/2015, 12:14 PM   LOS: 5 days

## 2015-10-18 NOTE — Progress Notes (Signed)
Subjective:  Edema improvig  Objective:  Vital Signs in the last 24 hours: Temp:  [97.7 F (36.5 C)-98.4 F (36.9 C)] 98.3 F (36.8 C) (02/17 1215) Pulse Rate:  [74-84] 84 (02/17 1215) Resp:  [20] 20 (02/17 1215) BP: (133-170)/(72-87) 133/75 mmHg (02/17 1215) SpO2:  [97 %-98 %] 98 % (02/17 1215) Weight:  [137.395 kg (302 lb 14.4 oz)] 137.395 kg (302 lb 14.4 oz) (02/17 0502)  Intake/Output from previous day:  Intake/Output Summary (Last 24 hours) at 10/18/15 1400 Last data filed at 10/18/15 1046  Gross per 24 hour  Intake   1180 ml  Output   2100 ml  Net   -920 ml    Physical Exam: General appearance: alert, cooperative, no distress and morbidly obese Lungs: decreased breath sounds Heart: regular rate and rhythm Extremities: 1+ pitting edema to knees   Rate: 88  Rhythm: normal sinus rhythm  Lab Results:  Recent Labs  10/17/15 0540  WBC 7.0  HGB 13.1  PLT 162    Recent Labs  10/17/15 0540 10/18/15 0420  NA 144 145  K 4.0 3.6  CL 100* 99*  CO2 33* 36*  GLUCOSE 179* 143*  BUN 28* 32*  CREATININE 2.57* 2.70*   No results for input(s): TROPONINI in the last 72 hours.  Invalid input(s): CK, MB No results for input(s): INR in the last 72 hours.  Scheduled Meds: . amitriptyline  50 mg Oral QHS  . amLODipine  5 mg Oral Daily  . aspirin  325 mg Oral Daily  . atorvastatin  80 mg Oral Daily  . carvedilol  25 mg Oral BID WC  . cloNIDine  0.3 mg Oral TID  . fluticasone  2 spray Each Nare Daily  . furosemide  80 mg Oral BID  . gabapentin  300 mg Oral Daily  . gabapentin  600 mg Oral QHS  . heparin  5,000 Units Subcutaneous 3 times per day  . insulin aspart  0-9 Units Subcutaneous TID WC  . insulin aspart  7 Units Subcutaneous TID WC  . insulin glargine  35 Units Subcutaneous Daily  . minoxidil  10 mg Oral BID  . potassium chloride  20 mEq Oral Daily   Continuous Infusions:  PRN Meds:.acetaminophen, camphor-menthol, hydrALAZINE, hydrOXYzine,  levalbuterol, morphine injection, nitroGLYCERIN, ondansetron (ZOFRAN) IV   Imaging: CHEST 2 VIEW 10/18/15  COMPARISON: 10/14/2015  FINDINGS: Cardiomegaly again noted. Central mild vascular congestion without convincing pulmonary edema. Persistent small right pleural effusion right basilar atelectasis or infiltrate. Mild left basilar atelectasis. Slight improvement in aeration.  IMPRESSION: Central mild vascular congestion without convincing pulmonary edema. Slight improvement in aeration. Persistent small right pleural effusion with right basilar atelectasis or infiltrate. Mild left basilar atelectasis.  Cardiac Studies: Echo 10/14/15 Study Conclusions  - Left ventricle: The cavity size was normal. There was severe concentric hypertrophy. Systolic function was normal. The estimated ejection fraction was in the range of 60% to 65%. Wall motion was normal; there were no regional wall motion abnormalities. There was an increased relative contribution of atrial contraction to ventricular filling. Doppler parameters are consistent with abnormal left ventricular relaxation (grade 1 diastolic dysfunction). - Right ventricle: The cavity size was mildly dilated. Wall thickness was normal.   Assessment/Plan:  50 yo obese Caucasian male with PMH HTN, Hyerlipidemia, renal insufficiency, DM, and substance abuse, seen 10/13/15 in ED for acute on chronic diastolic CHF. UDS positive for cocaine. He has diuresed 6 L, but his wgt is only down 2 lbs.  Principal Problem:   Acute on chronic diastolic CHF (congestive heart failure), NYHA class 4 (HCC) Active Problems:   Acute respiratory failure with hypoxia (HCC)   Obstructive sleep apnea   Acute on chronic renal insufficiency (HCC)   Cocaine abuse   Essential hypertension, malignant   CKD (chronic kidney disease), stage III   Type 2 diabetes, uncontrolled, with neuropathy ([place])   Hyperlipemia   Legally blind   Obesity  hypoventilation syndrome (HCC)   Marijuana abuse   Tobacco abuse   Acute on chronic congestive heart failure ([place])   PLAN: See Dr Jason Nest note from 10/17/15. His B/P is poorly controlled. No significant wgt loss though edema does appear improved. BNP was only 252 on admission so not helpful.   Kerin Ransom PA-C 10/18/2015, 2:00 PM [phone]  I have seen and examined the patient along with Kerin Ransom PA-C.  I have reviewed the chart, notes and new data.  I agree with PA's note.  BP control and optimal volume status remain elusive.  PLAN: Increase clonidine to 0.3 mg TID. Increase amlodipine tomorrow if BP still high  Sanda Klein, MD, [facility] [phone] 10/18/2015, 3:22 PM

## 2015-10-19 LAB — BASIC METABOLIC PANEL
ANION GAP: 12 (ref 5–15)
BUN: 38 mg/dL — ABNORMAL HIGH (ref 6–20)
CALCIUM: 8.7 mg/dL — AB (ref 8.9–10.3)
CO2: 35 mmol/L — AB (ref 22–32)
CREATININE: 2.84 mg/dL — AB (ref 0.61–1.24)
Chloride: 96 mmol/L — ABNORMAL LOW (ref 101–111)
GFR calc Af Amer: 28 mL/min — ABNORMAL LOW (ref >60)
GFR, EST NON AFRICAN AMERICAN: 24 mL/min — AB (ref >60)
GLUCOSE: 242 mg/dL — AB (ref 65–99)
Potassium: 3.7 mmol/L (ref 3.5–5.1)
Sodium: 143 mmol/L (ref 135–145)

## 2015-10-19 LAB — GLUCOSE, CAPILLARY
GLUCOSE-CAPILLARY: 208 mg/dL — AB (ref 65–99)
GLUCOSE-CAPILLARY: 273 mg/dL — AB (ref 65–99)
Glucose-Capillary: 285 mg/dL — ABNORMAL HIGH (ref 65–99)
Glucose-Capillary: 226 mg/dL — ABNORMAL HIGH (ref 65–99)

## 2015-10-19 MED ORDER — HYDRALAZINE HCL 50 MG PO TABS
100.0000 mg | ORAL_TABLET | Freq: Three times a day (TID) | ORAL | Status: DC
  Administered 2015-10-19 – 2015-10-22 (×9): 100 mg via ORAL
  Filled 2015-10-19 (×9): qty 2

## 2015-10-19 MED ORDER — FUROSEMIDE 80 MG PO TABS
80.0000 mg | ORAL_TABLET | Freq: Once | ORAL | Status: AC
  Administered 2015-10-19: 80 mg via ORAL
  Filled 2015-10-19: qty 1

## 2015-10-19 MED ORDER — FUROSEMIDE 80 MG PO TABS
120.0000 mg | ORAL_TABLET | Freq: Two times a day (BID) | ORAL | Status: DC
  Administered 2015-10-19 – 2015-10-20 (×2): 120 mg via ORAL
  Filled 2015-10-19 (×3): qty 1

## 2015-10-19 MED ORDER — POTASSIUM CHLORIDE CRYS ER 20 MEQ PO TBCR
20.0000 meq | EXTENDED_RELEASE_TABLET | Freq: Two times a day (BID) | ORAL | Status: DC
  Administered 2015-10-19 – 2015-10-22 (×6): 20 meq via ORAL
  Filled 2015-10-19 (×6): qty 1

## 2015-10-19 NOTE — Progress Notes (Signed)
Subjective:  Jerry Cantrell is a 50 y.o. male With a history of combined systolic and diastolic heart failure, DM2 insulin-dependent, OSA on BiPaP, CKD stage IIIB, presenting to the hospital on 1 week history of progressive shortness of breath, with increasing dyspnea on exertion, progressing to orthopnea and pedal edema  UDS is + for cocaine  I/O are -8.3 liters.    Weight is 298  Wt Readings from Last 3 Encounters:  10/19/15 298 lb 14.4 oz (135.58 kg)  09/23/15 299 lb 12.8 oz (135.988 kg)  08/07/15 291 lb 6.4 oz (132.178 kg)   Edema improvig  Objective:  Vital Signs in the last 24 hours: Temp:  [98 F (36.7 C)-98.5 F (36.9 C)] 98.2 F (36.8 C) (02/18 0937) Pulse Rate:  [72-84] 80 (02/18 0546) Resp:  [20] 20 (02/18 0937) BP: (133-183)/(75-94) 183/94 mmHg (02/18 0937) SpO2:  [92 %-98 %] 93 % (02/18 0937) Weight:  [298 lb 14.4 oz (135.58 kg)] 298 lb 14.4 oz (135.58 kg) (02/18 0546)  Intake/Output from previous day:  Intake/Output Summary (Last 24 hours) at 10/19/15 1012 Last data filed at 10/19/15 0829  Gross per 24 hour  Intake   1320 ml  Output   3975 ml  Net  -2655 ml    Physical Exam: General appearance: alert, cooperative, no distress and morbidly obese Lungs: decreased breath sounds Heart: regular rate and rhythm Extremities: 1+ pitting edema to knees   Rate: 88  Rhythm: normal sinus rhythm  Lab Results:  Recent Labs  10/17/15 0540  WBC 7.0  HGB 13.1  PLT 162    Recent Labs  10/18/15 0420 10/19/15 0542  NA 145 143  K 3.6 3.7  CL 99* 96*  CO2 36* 35*  GLUCOSE 143* 242*  BUN 32* 38*  CREATININE 2.70* 2.84*   No results for input(s): TROPONINI in the last 72 hours.  Invalid input(s): CK, MB No results for input(s): INR in the last 72 hours.  Scheduled Meds: . amitriptyline  50 mg Oral QHS  . amLODipine  5 mg Oral Daily  . aspirin  325 mg Oral Daily  . atorvastatin  80 mg Oral Daily  . carvedilol  25 mg Oral BID WC  . cloNIDine  0.3  mg Oral TID  . fluticasone  2 spray Each Nare Daily  . furosemide  80 mg Oral BID  . gabapentin  300 mg Oral Daily  . gabapentin  600 mg Oral QHS  . heparin  5,000 Units Subcutaneous 3 times per day  . insulin aspart  0-9 Units Subcutaneous TID WC  . insulin aspart  7 Units Subcutaneous TID WC  . insulin glargine  35 Units Subcutaneous Daily  . minoxidil  10 mg Oral BID  . potassium chloride  20 mEq Oral Daily   Continuous Infusions:  PRN Meds:.acetaminophen, camphor-menthol, hydrALAZINE, hydrOXYzine, levalbuterol, morphine injection, nitroGLYCERIN, ondansetron (ZOFRAN) IV   Imaging: CHEST 2 VIEW 10/18/15  COMPARISON: 10/14/2015  FINDINGS: Cardiomegaly again noted. Central mild vascular congestion without convincing pulmonary edema. Persistent small right pleural effusion right basilar atelectasis or infiltrate. Mild left basilar atelectasis. Slight improvement in aeration.  IMPRESSION: Central mild vascular congestion without convincing pulmonary edema. Slight improvement in aeration. Persistent small right pleural effusion with right basilar atelectasis or infiltrate. Mild left basilar atelectasis.  Cardiac Studies: Echo 10/14/15 Study Conclusions  - Left ventricle: The cavity size was normal. There was severe concentric hypertrophy. Systolic function was normal. The estimated ejection fraction was in the range of  60% to 65%. Wall motion was normal; there were no regional wall motion abnormalities. There was an increased relative contribution of atrial contraction to ventricular filling. Doppler parameters are consistent with abnormal left ventricular relaxation (grade 1 diastolic dysfunction). - Right ventricle: The cavity size was mildly dilated. Wall thickness was normal.   Assessment/Plan:  50 yo obese Caucasian male with PMH HTN, Hyerlipidemia, renal insufficiency, DM, and substance abuse, seen 10/13/15 in ED for acute on chronic diastolic CHF.  UDS positive for cocaine. He has diuresed 6 L, but his wgt is only down 2 lbs.   Principal Problem:   Acute on chronic diastolic CHF (congestive heart failure), NYHA class 4 (HCC) Active Problems:   Hyperlipemia   Obstructive sleep apnea   Acute on chronic renal insufficiency (HCC)   Cocaine abuse   Legally blind   Acute respiratory failure with hypoxia (HCC)   Essential hypertension, malignant   Obesity hypoventilation syndrome (HCC)   CKD (chronic kidney disease), stage III   Type 2 diabetes, uncontrolled, with neuropathy ([place])   Marijuana abuse   Tobacco abuse   Acute on chronic congestive heart failure ([place])   1. HTN:   Still eats salt,   UDS is + for cocaine. He has CKD stage 3.    These are very likely to be contributing to his acute on chronic diastolic dysfunction . Continue medical therapy.  2. Chronic diastolic CHF - echo [id number] shows normal LV systolic function.   He has diastolic dysfunction .  Needs to avoid cocaine.   Continue aggressive RX for HTN  Now new recs.    Tishie Altmann, Wonda Cheng, MD  10/19/2015 10:27 AM    [address],  [place], [place]  [id number] Pager [phone] Phone: [phone]; Fax: [phone]

## 2015-10-19 NOTE — Progress Notes (Signed)
PROGRESS NOTE  Jerry Cantrell [id number] DOB: 05-Jan-1966 DOA: 10/13/2015 PCP: Helane Rima, MD  Admit HPI / Brief Narrative: 50 y.o. male with a history of combined systolic and diastolic heart failure, DM2, OSA on BIPAP, and CKD stage III who presented w/ a 1 week history of progressive shortness of breath and pedal edema. He reported increasing his Lasix to 80 mg by mouth twice a day without significant improvement in his volume status. In the ED he was noted to have a BP of 182/98, normal pulse and respirations, and saturation 98 on BiPAP.  Subjective: Denies any new complaints, does not wear his CPAP last night. Blood pressure appears to be higher in the morning.  Assessment/Plan:  Acute respiratory failure with hypoxemia due to acute pulmonary edema Presented with acute pulmonary edema likely secondary to diastolic CHF and CKD stage III. This is improving but patient is still require supplemental oxygen. Appears to be stable on 80 mg of Lasix twice a day, still has edema but getting diuresed effectively.  Malignant Hypertension Difficult to control blood pressure, appreciate cardiology's help. Patient is fluid overloaded, likely volume is contributing to the difficult to control blood pressure I think volume overload and OSA contributing side-by-side to his malignant hypertension. Continue diuresis and CPAP.  Acute on chronic diastolic CHF  Decompensation due to cocaine abuse + HTN - Cardiology following. Chest x-ray showed some improvement. Continue current diuretics dose, -6 L since admission, weight down at least 12 pounds.  Chronic kidney disease stage III This is around baseline, on 80 mg of Lasix orally twice a day at home. I appreciate nephrology help.  DM2 w/ diabetic neuropathy  A1C 8.9 on 11/2013 - CBG now very well controlled, w/ some hypoglycemia early this morning - reduce insulin dose and follow   Cocaine abuse  patient has been counseled on the absolute  requirement that he discontinue cocaine use completely  Dysuria  UA not c/w UTI  Tobacco abuse patient has been counseled on the absolute need to discontinue tobacco abuse  Obesity - Body mass index is 39.44 kg/(m^2).   OSA/obesity Hypoventilation syndrome -Continue BiPAP at night and w/ naps   Diffuse rash  -no clear etiology on exam - is subacute - distribution not c/w scabies - lesions not suggestive of bed bugs - atarax prn - consider outpt Derm eval for bx if persists    Code Status: FULL Family Communication: no family present at time of exam Disposition Plan: stable for transfer to tele bed - cont diuresis as renal fxn allows   Consultants: [facility]   Procedures: TTE - EF 60-65% - no WMA - grade 1 DD  Antibiotics: Azithromycin 2/12 > 2/13 Ceftriaxone 2/12 > 2/13  DVT prophylaxis: SQ heparin   Objective: Blood pressure 183/94, pulse 80, temperature 98.2 F (36.8 C), temperature source Oral, resp. rate 20, height 6\' 1"  (1.854 m), weight 135.58 kg (298 lb 14.4 oz), SpO2 93 %.  Intake/Output Summary (Last 24 hours) at 10/19/15 1138 Last data filed at 10/19/15 1059  Gross per 24 hour  Intake   1080 ml  Output   3925 ml  Net  -2845 ml   Exam: General: No acute respiratory distress evident Lungs: mild bibasilar crackles persist - no wheezing  Cardiovascular: Regular rate and rhythm without murmur gallop or rub = Abdomen: Nontender, obese, soft, bowel sounds positive, no rebound, no ascites, no appreciable mass Extremities: No significant cyanosis, or clubbing;  1+ edema bilateral lower extremities persists  Data  Reviewed:  Basic Metabolic Panel:  Recent Labs Lab 10/14/15 0217 10/15/15 0304 10/17/15 0540 10/18/15 0420 10/19/15 0542  NA 146* 144 144 145 143  K 4.0 4.0 4.0 3.6 3.7  CL 103 101 100* 99* 96*  CO2 30 31 33* 36* 35*  GLUCOSE 221* 346* 179* 143* 242*  BUN 28* 31* 28* 32* 38*  CREATININE 2.50* 2.71* 2.57* 2.70* 2.84*  CALCIUM 9.0  8.9 8.9 8.8* 8.7*    CBC:  Recent Labs Lab 10/13/15 1130 10/13/15 1141 10/14/15 0217 10/15/15 0304 10/17/15 0540  WBC 7.2  --  15.6* 8.4 7.0  NEUTROABS  --   --  13.8*  --   --   HGB 14.0 15.6 14.0 13.3 13.1  HCT 44.9 46.0 46.0 45.2 43.0  MCV 93.3  --  93.7 95.2 93.5  PLT 188  --  209 190 162    Liver Function Tests:  Recent Labs Lab 10/15/15 0304  AST 11*  ALT 10*  ALKPHOS 110  BILITOT 0.4  PROT 5.8*  ALBUMIN 2.2*    Cardiac Enzymes:  Recent Labs Lab 10/13/15 1711 10/13/15 1854 10/13/15 2220  TROPONINI 0.05* 0.06* 0.05*    CBG:  Recent Labs Lab 10/18/15 0616 10/18/15 1146 10/18/15 1646 10/18/15 2125 10/19/15 0619  GLUCAP 131* 258* 297* 270* 208*    Recent Results (from the past 240 hour(s))  Culture, blood (routine x 2) Call MD if unable to obtain prior to antibiotics being given     Status: None   Collection Time: 10/13/15  4:35 PM  Result Value Ref Range Status   Specimen Description BLOOD RIGHT ANTECUBITAL  Final   Special Requests BOTTLES DRAWN AEROBIC AND ANAEROBIC 5CC  Final   Culture NO GROWTH 5 DAYS  Final   Report Status 10/18/2015 FINAL  Final  Culture, blood (routine x 2) Call MD if unable to obtain prior to antibiotics being given     Status: None   Collection Time: 10/13/15  4:40 PM  Result Value Ref Range Status   Specimen Description BLOOD RIGHT HAND  Final   Special Requests BOTTLES DRAWN AEROBIC AND ANAEROBIC 5CC  Final   Culture NO GROWTH 5 DAYS  Final   Report Status 10/18/2015 FINAL  Final  MRSA PCR Screening     Status: None   Collection Time: 10/13/15  6:38 PM  Result Value Ref Range Status   MRSA by PCR NEGATIVE NEGATIVE Final    Comment:        The GeneXpert MRSA Assay (FDA approved for NASAL specimens only), is one component of a comprehensive MRSA colonization surveillance program. It is not intended to diagnose MRSA infection nor to guide or monitor treatment for MRSA infections.      Studies:     Recent x-ray studies have been reviewed in detail by the Attending Physician  Scheduled Meds:  Scheduled Meds: . amitriptyline  50 mg Oral QHS  . amLODipine  5 mg Oral Daily  . aspirin  325 mg Oral Daily  . atorvastatin  80 mg Oral Daily  . carvedilol  25 mg Oral BID WC  . cloNIDine  0.3 mg Oral TID  . fluticasone  2 spray Each Nare Daily  . furosemide  80 mg Oral BID  . gabapentin  300 mg Oral Daily  . gabapentin  600 mg Oral QHS  . heparin  5,000 Units Subcutaneous 3 times per day  . insulin aspart  0-9 Units Subcutaneous TID WC  . insulin aspart  7 Units Subcutaneous TID WC  . insulin glargine  35 Units Subcutaneous Daily  . minoxidil  10 mg Oral BID  . potassium chloride  20 mEq Oral Daily    Time spent on care of this patient: 35 mins   Verlee Monte A , MD   [facility] Office  [phone] Pager - Text Page per Shea Evans as per below:  On-Call/Text Page:      [email or web address]      password [email or web address]  If 7PM-7AM, please contact night-coverage [email or web address] Password [email or web address] 10/19/2015, 11:38 AM   LOS: 6 days

## 2015-10-19 NOTE — Progress Notes (Signed)
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 Increase furosemide a little.  Will stop minoxidil due to potent sodium retentive properties. Limit PO fluid   Subjective: Interval History: Good UOP  Objective: Vital signs in last 24 hours: Temp:  [98 F (36.7 C)-98.5 F (36.9 C)] 98.2 F (36.8 C) (02/18 0937) Pulse Rate:  [72-80] 80 (02/18 0546) Resp:  [20] 20 (02/18 0937) BP: (152-183)/(79-94) 183/94 mmHg (02/18 0937) SpO2:  [92 %-98 %] 93 % (02/18 0937) Weight:  [135.58 kg (298 lb 14.4 oz)] 135.58 kg (298 lb 14.4 oz) (02/18 0546) Weight change: -1.814 kg (-4 lb)  Intake/Output from previous day: 02/17 0701 - 02/18 0700 In: 1800 [P.O.:1800] Out: 3675 [Urine:3675] Intake/Output this shift: Total I/O In: -  Out: 700 [Urine:700]  General appearance: alert and cooperative Resp: diminished breath sounds bibasilar Chest wall: no tenderness Cardio: regular rate and rhythm, S1, S2 normal, no murmur, click, rub or gallop Extremities: edema 2-3+  Lab Results:  Recent Labs  10/17/15 0540  WBC 7.0  HGB 13.1  HCT 43.0  PLT 162   BMET:  Recent Labs  10/18/15 0420 10/19/15 0542  NA 145 143  K 3.6 3.7  CL 99* 96*  CO2 36* 35*  GLUCOSE 143* 242*  BUN 32* 38*  CREATININE 2.70* 2.84*  CALCIUM 8.8* 8.7*   No results for input(s): PTH in the last 72 hours. Iron Studies: No results for input(s): IRON, TIBC, TRANSFERRIN, FERRITIN in the last 72 hours. Studies/Results: Dg Chest 2 View  10/18/2015  CLINICAL DATA:  Pulmonary edema EXAM: CHEST  2 VIEW COMPARISON:  10/14/2015 FINDINGS: Cardiomegaly again noted. Central mild vascular congestion without convincing pulmonary edema. Persistent small right pleural effusion right basilar atelectasis or infiltrate. Mild left basilar atelectasis. Slight improvement in aeration. IMPRESSION: Central mild  vascular congestion without convincing pulmonary edema. Slight improvement in aeration. Persistent small right pleural effusion with right basilar atelectasis or infiltrate. Mild left basilar atelectasis. Electronically Signed   By: Lahoma Crocker M.D.   On: 10/18/2015 09:31    Scheduled: . amitriptyline  50 mg Oral QHS  . amLODipine  5 mg Oral Daily  . aspirin  325 mg Oral Daily  . atorvastatin  80 mg Oral Daily  . carvedilol  25 mg Oral BID WC  . cloNIDine  0.3 mg Oral TID  . fluticasone  2 spray Each Nare Daily  . furosemide  80 mg Oral BID  . gabapentin  300 mg Oral Daily  . gabapentin  600 mg Oral QHS  . heparin  5,000 Units Subcutaneous 3 times per day  . insulin aspart  0-9 Units Subcutaneous TID WC  . insulin aspart  7 Units Subcutaneous TID WC  . insulin glargine  35 Units Subcutaneous Daily  . minoxidil  10 mg Oral BID  . potassium chloride  20 mEq Oral Daily     LOS: 6 days   Shearon Clonch C 10/19/2015,12:39 PM

## 2015-10-20 DIAGNOSIS — F1210 Cannabis abuse, uncomplicated: Secondary | ICD-10-CM

## 2015-10-20 LAB — GLUCOSE, CAPILLARY
GLUCOSE-CAPILLARY: 301 mg/dL — AB (ref 65–99)
Glucose-Capillary: 114 mg/dL — ABNORMAL HIGH (ref 65–99)
Glucose-Capillary: 184 mg/dL — ABNORMAL HIGH (ref 65–99)
Glucose-Capillary: 270 mg/dL — ABNORMAL HIGH (ref 65–99)

## 2015-10-20 LAB — BASIC METABOLIC PANEL
Anion gap: 8 (ref 5–15)
BUN: 40 mg/dL — AB (ref 6–20)
CALCIUM: 8.7 mg/dL — AB (ref 8.9–10.3)
CO2: 38 mmol/L — AB (ref 22–32)
CREATININE: 2.86 mg/dL — AB (ref 0.61–1.24)
Chloride: 98 mmol/L — ABNORMAL LOW (ref 101–111)
GFR calc Af Amer: 28 mL/min — ABNORMAL LOW (ref >60)
GFR calc non Af Amer: 24 mL/min — ABNORMAL LOW (ref >60)
GLUCOSE: 138 mg/dL — AB (ref 65–99)
Potassium: 3.8 mmol/L (ref 3.5–5.1)
Sodium: 144 mmol/L (ref 135–145)

## 2015-10-20 MED ORDER — DOCUSATE SODIUM 100 MG PO CAPS
200.0000 mg | ORAL_CAPSULE | Freq: Every day | ORAL | Status: DC
  Administered 2015-10-20 – 2015-10-22 (×3): 200 mg via ORAL
  Filled 2015-10-20 (×3): qty 2

## 2015-10-20 MED ORDER — SENNA 8.6 MG PO TABS
1.0000 | ORAL_TABLET | Freq: Every day | ORAL | Status: DC
  Administered 2015-10-20 – 2015-10-22 (×3): 8.6 mg via ORAL
  Filled 2015-10-20 (×3): qty 1

## 2015-10-20 MED ORDER — SORBITOL 70 % SOLN
100.0000 mL | Freq: Every day | Status: DC | PRN
  Administered 2015-10-21: 100 mL via ORAL

## 2015-10-20 MED ORDER — FUROSEMIDE 10 MG/ML IJ SOLN
100.0000 mg | Freq: Four times a day (QID) | INTRAVENOUS | Status: DC
  Administered 2015-10-20 – 2015-10-21 (×4): 100 mg via INTRAVENOUS
  Filled 2015-10-20 (×6): qty 10

## 2015-10-20 NOTE — Progress Notes (Signed)
PROGRESS NOTE  Fernand Sheffler [id number] DOB: 1966/06/03 DOA: 10/13/2015 PCP: Helane Rima, MD  Admit HPI / Brief Narrative: 50 y.o. male with a history of combined systolic and diastolic heart failure, DM2, OSA on BIPAP, and CKD stage III who presented w/ a 1 week history of progressive shortness of breath and pedal edema. He reported increasing his Lasix to 80 mg by mouth twice a day without significant improvement in his volume status. In the ED he was noted to have a BP of 182/98, normal pulse and respirations, and saturation 98 on BiPAP.  Subjective: Patient told me he was taking extra Lasix pills he had from home. He was taking 80 mg twice a day on his own.  Assessment/Plan:  Acute respiratory failure with hypoxemia due to acute pulmonary edema Presented with acute pulmonary edema likely secondary to diastolic CHF and CKD stage III. This is improving but patient is still require supplemental oxygen. Stable in 2-3 L of oxygen through nasal cannula.  Malignant Hypertension Difficult to control blood pressure, appreciate cardiology's help. Patient is fluid overloaded, likely volume is contributing to the difficult to control blood pressure I think volume overload and OSA contributing side-by-side to his malignant hypertension. Continue diuresis and CPAP. This is improved, off of minoxidil.  Acute on chronic diastolic CHF  Decompensation due to cocaine abuse in volume overloaded. Chest x-ray showed some improvement. Continue current diuretics dose, -6 L since admission, weight down at least 12 pounds.  Chronic kidney disease stage III This is around baseline, still has significant volume overloaded. Nephrology increased Lasix to 100 mg 3 times a day.  DM2 w/ diabetic neuropathy  A1C 8.9 on 11/2013 - CBG now very well controlled, w/ some hypoglycemia early this morning - reduce insulin dose and follow   Cocaine abuse  Patient has been counseled on the absolute requirement  that he discontinue cocaine use completely  Dysuria  UA not c/w UTI  Tobacco abuse patient has been counseled on the absolute need to discontinue tobacco abuse  Obesity - Body mass index is 39.3 kg/(m^2).   OSA/obesity Hypoventilation syndrome -Continue BiPAP at night and w/ naps   Diffuse rash  -No clear etiology on exam - is subacute - distribution not c/w scabies - lesions not suggestive of bed bugs - atarax prn - consider outpt Derm eval for bx if persists     Code Status: FULL Family Communication: no family present at time of exam Disposition Plan: stable for transfer to tele bed - cont diuresis as renal fxn allows   Consultants: [facility]   Procedures: TTE - EF 60-65% - no WMA - grade 1 DD  Antibiotics: Azithromycin 2/12 > 2/13 Ceftriaxone 2/12 > 2/13  DVT prophylaxis: SQ heparin   Objective: Blood pressure 119/64, pulse 69, temperature 97.3 F (36.3 C), temperature source Oral, resp. rate 20, height 6\' 1"  (1.854 m), weight 135.081 kg (297 lb 12.8 oz), SpO2 94 %.  Intake/Output Summary (Last 24 hours) at 10/20/15 1415 Last data filed at 10/20/15 1300  Gross per 24 hour  Intake    800 ml  Output   2000 ml  Net  -1200 ml   Exam: General: No acute respiratory distress evident Lungs: mild bibasilar crackles persist - no wheezing  Cardiovascular: Regular rate and rhythm without murmur gallop or rub = Abdomen: Nontender, obese, soft, bowel sounds positive, no rebound, no ascites, no appreciable mass Extremities: No significant cyanosis, or clubbing;  1+ edema bilateral lower extremities persists  Data  Reviewed:  Basic Metabolic Panel:  Recent Labs Lab 10/15/15 0304 10/17/15 0540 10/18/15 0420 10/19/15 0542 10/20/15 0331  NA 144 144 145 143 144  K 4.0 4.0 3.6 3.7 3.8  CL 101 100* 99* 96* 98*  CO2 31 33* 36* 35* 38*  GLUCOSE 346* 179* 143* 242* 138*  BUN 31* 28* 32* 38* 40*  CREATININE 2.71* 2.57* 2.70* 2.84* 2.86*  CALCIUM 8.9 8.9 8.8*  8.7* 8.7*    CBC:  Recent Labs Lab 10/14/15 0217 10/15/15 0304 10/17/15 0540  WBC 15.6* 8.4 7.0  NEUTROABS 13.8*  --   --   HGB 14.0 13.3 13.1  HCT 46.0 45.2 43.0  MCV 93.7 95.2 93.5  PLT 209 190 162    Liver Function Tests:  Recent Labs Lab 10/15/15 0304  AST 11*  ALT 10*  ALKPHOS 110  BILITOT 0.4  PROT 5.8*  ALBUMIN 2.2*    Cardiac Enzymes:  Recent Labs Lab 10/13/15 1711 10/13/15 1854 10/13/15 2220  TROPONINI 0.05* 0.06* 0.05*    CBG:  Recent Labs Lab 10/19/15 1148 10/19/15 1647 10/19/15 2036 10/20/15 0644 10/20/15 1221  GLUCAP 285* 273* 226* 114* 184*    Recent Results (from the past 240 hour(s))  Culture, blood (routine x 2) Call MD if unable to obtain prior to antibiotics being given     Status: None   Collection Time: 10/13/15  4:35 PM  Result Value Ref Range Status   Specimen Description BLOOD RIGHT ANTECUBITAL  Final   Special Requests BOTTLES DRAWN AEROBIC AND ANAEROBIC 5CC  Final   Culture NO GROWTH 5 DAYS  Final   Report Status 10/18/2015 FINAL  Final  Culture, blood (routine x 2) Call MD if unable to obtain prior to antibiotics being given     Status: None   Collection Time: 10/13/15  4:40 PM  Result Value Ref Range Status   Specimen Description BLOOD RIGHT HAND  Final   Special Requests BOTTLES DRAWN AEROBIC AND ANAEROBIC 5CC  Final   Culture NO GROWTH 5 DAYS  Final   Report Status 10/18/2015 FINAL  Final  MRSA PCR Screening     Status: None   Collection Time: 10/13/15  6:38 PM  Result Value Ref Range Status   MRSA by PCR NEGATIVE NEGATIVE Final    Comment:        The GeneXpert MRSA Assay (FDA approved for NASAL specimens only), is one component of a comprehensive MRSA colonization surveillance program. It is not intended to diagnose MRSA infection nor to guide or monitor treatment for MRSA infections.      Studies:   Recent x-ray studies have been reviewed in detail by the Attending Physician  Scheduled  Meds:  Scheduled Meds: . amitriptyline  50 mg Oral QHS  . amLODipine  5 mg Oral Daily  . aspirin  325 mg Oral Daily  . atorvastatin  80 mg Oral Daily  . carvedilol  25 mg Oral BID WC  . cloNIDine  0.3 mg Oral TID  . docusate sodium  200 mg Oral Daily  . fluticasone  2 spray Each Nare Daily  . furosemide  100 mg Intravenous Q6H  . gabapentin  300 mg Oral Daily  . gabapentin  600 mg Oral QHS  . heparin  5,000 Units Subcutaneous 3 times per day  . hydrALAZINE  100 mg Oral TID  . insulin aspart  0-9 Units Subcutaneous TID WC  . insulin aspart  7 Units Subcutaneous TID WC  . insulin glargine  35 Units Subcutaneous Daily  . potassium chloride  20 mEq Oral BID  . senna  1 tablet Oral Daily    Time spent on care of this patient: 35 mins   Birdie Hopes , MD   [facility] Office  [phone] Pager - Text Page per Shea Evans as per below:  On-Call/Text Page:      [email or web address]      password [email or web address]  If 7PM-7AM, please contact night-coverage [email or web address] Password [email or web address] 10/20/2015, 2:15 PM   LOS: 7 days

## 2015-10-20 NOTE — Progress Notes (Signed)
Subjective:  Jerry Cantrell is a 50 y.o. male With a history of combined systolic and diastolic heart failure, DM2 insulin-dependent, OSA on BiPaP, CKD stage IIIB, presenting to the hospital on 1 week history of progressive shortness of breath, with increasing dyspnea on exertion, progressing to orthopnea and pedal edema  UDS is + for cocaine  I/O are -10  liters.      Weight is 2987 Wt Readings from Last 3 Encounters:  10/20/15 297 lb 12.8 oz (135.081 kg)  09/23/15 299 lb 12.8 oz (135.988 kg)  08/07/15 291 lb 6.4 oz (132.178 kg)   Edema improving  Objective:    Intake/Output from previous day:  Intake/Output Summary (Last 24 hours) at 10/20/15 0925 Last data filed at 10/20/15 0503  Gross per 24 hour  Intake    680 ml  Output   2400 ml  Net  -1720 ml    Physical Exam: BP 135/80 mmHg  Pulse 68  Temp(Src) 97.6 F (36.4 C) (Oral)  Resp 18  Ht 6\' 1"  (1.854 m)  Wt 297 lb 12.8 oz (135.081 kg)  BMI 39.30 kg/m2  SpO2 94% General appearance: alert, cooperative, no distress and morbidly obese Lungs: decreased breath sounds Heart: regular rate and rhythm Extremities: 1+ pitting edema to knees    Lab Results: No results for input(s): WBC, HGB, PLT in the last 72 hours.  Recent Labs  10/19/15 0542 10/20/15 0331  NA 143 144  K 3.7 3.8  CL 96* 98*  CO2 35* 38*  GLUCOSE 242* 138*  BUN 38* 40*  CREATININE 2.84* 2.86*   No results for input(s): TROPONINI in the last 72 hours.  Invalid input(s): CK, MB No results for input(s): INR in the last 72 hours.  Scheduled Meds: . amitriptyline  50 mg Oral QHS  . amLODipine  5 mg Oral Daily  . aspirin  325 mg Oral Daily  . atorvastatin  80 mg Oral Daily  . carvedilol  25 mg Oral BID WC  . cloNIDine  0.3 mg Oral TID  . docusate sodium  200 mg Oral Daily  . fluticasone  2 spray Each Nare Daily  . furosemide  120 mg Oral BID  . gabapentin  300 mg Oral Daily  . gabapentin  600 mg Oral QHS  . heparin  5,000 Units  Subcutaneous 3 times per day  . hydrALAZINE  100 mg Oral TID  . insulin aspart  0-9 Units Subcutaneous TID WC  . insulin aspart  7 Units Subcutaneous TID WC  . insulin glargine  35 Units Subcutaneous Daily  . potassium chloride  20 mEq Oral BID  . senna  1 tablet Oral Daily   Continuous Infusions:  PRN Meds:.acetaminophen, camphor-menthol, hydrOXYzine, levalbuterol, morphine injection, nitroGLYCERIN, ondansetron (ZOFRAN) IV   Imaging: CHEST 2 VIEW 10/18/15  COMPARISON: 10/14/2015  FINDINGS: Cardiomegaly again noted. Central mild vascular congestion without convincing pulmonary edema. Persistent small right pleural effusion right basilar atelectasis or infiltrate. Mild left basilar atelectasis. Slight improvement in aeration.  IMPRESSION: Central mild vascular congestion without convincing pulmonary edema. Slight improvement in aeration. Persistent small right pleural effusion with right basilar atelectasis or infiltrate. Mild left basilar atelectasis.  Cardiac Studies: Echo 10/14/15 Study Conclusions  - Left ventricle: The cavity size was normal. There was severe concentric hypertrophy. Systolic function was normal. The estimated ejection fraction was in the range of 60% to 65%. Wall motion was normal; there were no regional wall motion abnormalities. There was an increased relative contribution of  atrial contraction to ventricular filling. Doppler parameters are consistent with abnormal left ventricular relaxation (grade 1 diastolic dysfunction). - Right ventricle: The cavity size was mildly dilated. Wall thickness was normal.   Assessment/Plan:  50 yo obese Caucasian male with PMH HTN, Hyerlipidemia, renal insufficiency, DM, and substance abuse, seen 10/13/15 in ED for acute on chronic diastolic CHF. UDS positive for cocaine. He has diuresed 6 L, but his wgt is only down 2 lbs.   Principal Problem:   Acute on chronic diastolic CHF (congestive heart  failure), NYHA class 4 (HCC) Active Problems:   Hyperlipemia   Obstructive sleep apnea   Acute on chronic renal insufficiency (HCC)   Cocaine abuse   Legally blind   Acute respiratory failure with hypoxia (HCC)   Essential hypertension, malignant   Obesity hypoventilation syndrome (HCC)   CKD (chronic kidney disease), stage III   Type 2 diabetes, uncontrolled, with neuropathy ([place])   Marijuana abuse   Tobacco abuse   Acute on chronic congestive heart failure ([place])   1. HTN:   Still eats salt,   UDS is + for cocaine. He has CKD stage 3.    These are very likely to be contributing to his acute on chronic diastolic dysfunction . BP is better today   2. Chronic diastolic CHF - echo [id number] shows normal LV systolic function.   He has diastolic dysfunction .  Needs to avoid cocaine.   Continue aggressive RX for HTN  Now new recs.  Will sign off.  Call for questions    Nahser, Wonda Cheng, MD  10/20/2015 9:25 AM    [place] [address],  [place], [place]  [address] Pager [phone] Phone: [phone]; Fax: [phone]

## 2015-10-20 NOTE — Progress Notes (Signed)
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 (baseline cr about 2.5 )  Hypertension, suboptimal  Volume Overload  Plan: Switch to IV furosemide  Subjective: Interval History: slow progress   Objective: Vital signs in last 24 hours: Temp:  [97.6 F (36.4 C)-98.2 F (36.8 C)] 97.6 F (36.4 C) (02/19 0504) Pulse Rate:  [67-68] 68 (02/19 0504) Resp:  [18] 18 (02/19 0504) BP: (131-135)/(68-80) 135/80 mmHg (02/19 0504) SpO2:  [94 %-97 %] 94 % (02/19 0504) Weight:  [135.081 kg (297 lb 12.8 oz)] 135.081 kg (297 lb 12.8 oz) (02/19 0504) Weight change: -0.499 kg (-1 lb 1.6 oz)  Intake/Output from previous day: 02/18 0701 - 02/19 0700 In: 680 [P.O.:680] Out: 2700 [Urine:2700] Intake/Output this shift: Total I/O In: 240 [P.O.:240] Out: 200 [Urine:200]  General appearance: alert and cooperative Chest wall: no tenderness Cardio: regular rate and rhythm, S1, S2 normal, no murmur, click, rub or gallop Extremities: edema 2-3+  Lab Results: No results for input(s): WBC, HGB, HCT, PLT in the last 72 hours. BMET:  Recent Labs  10/19/15 0542 10/20/15 0331  NA 143 144  K 3.7 3.8  CL 96* 98*  CO2 35* 38*  GLUCOSE 242* 138*  BUN 38* 40*  CREATININE 2.84* 2.86*  CALCIUM 8.7* 8.7*   No results for input(s): PTH in the last 72 hours. Iron Studies: No results for input(s): IRON, TIBC, TRANSFERRIN, FERRITIN in the last 72 hours. Studies/Results: No results found.  Scheduled: . amitriptyline  50 mg Oral QHS  . amLODipine  5 mg Oral Daily  . aspirin  325 mg Oral Daily  . atorvastatin  80 mg Oral Daily  . carvedilol  25 mg Oral BID WC  . cloNIDine  0.3 mg Oral TID  . docusate sodium  200 mg Oral Daily  . fluticasone  2 spray Each Nare Daily  . furosemide  100 mg Intravenous Q6H  . gabapentin  300 mg Oral Daily  . gabapentin  600 mg Oral QHS  . heparin  5,000 Units Subcutaneous 3 times per day  . hydrALAZINE  100 mg Oral TID  . insulin aspart   0-9 Units Subcutaneous TID WC  . insulin aspart  7 Units Subcutaneous TID WC  . insulin glargine  35 Units Subcutaneous Daily  . potassium chloride  20 mEq Oral BID  . senna  1 tablet Oral Daily     LOS: 7 days   Evelyn Moch C 10/20/2015,12:29 PM

## 2015-10-21 LAB — BASIC METABOLIC PANEL
ANION GAP: 9 (ref 5–15)
BUN: 43 mg/dL — ABNORMAL HIGH (ref 6–20)
CALCIUM: 8.7 mg/dL — AB (ref 8.9–10.3)
CO2: 37 mmol/L — AB (ref 22–32)
Chloride: 98 mmol/L — ABNORMAL LOW (ref 101–111)
Creatinine, Ser: 2.97 mg/dL — ABNORMAL HIGH (ref 0.61–1.24)
GFR calc non Af Amer: 23 mL/min — ABNORMAL LOW (ref >60)
GFR, EST AFRICAN AMERICAN: 27 mL/min — AB (ref >60)
Glucose, Bld: 166 mg/dL — ABNORMAL HIGH (ref 65–99)
Potassium: 3.7 mmol/L (ref 3.5–5.1)
Sodium: 144 mmol/L (ref 135–145)

## 2015-10-21 LAB — GLUCOSE, CAPILLARY
GLUCOSE-CAPILLARY: 135 mg/dL — AB (ref 65–99)
GLUCOSE-CAPILLARY: 162 mg/dL — AB (ref 65–99)
GLUCOSE-CAPILLARY: 48 mg/dL — AB (ref 65–99)
Glucose-Capillary: 145 mg/dL — ABNORMAL HIGH (ref 65–99)
Glucose-Capillary: 89 mg/dL (ref 65–99)

## 2015-10-21 MED ORDER — FUROSEMIDE 80 MG PO TABS
160.0000 mg | ORAL_TABLET | Freq: Two times a day (BID) | ORAL | Status: DC
  Administered 2015-10-21 – 2015-10-22 (×2): 160 mg via ORAL
  Filled 2015-10-21 (×2): qty 2

## 2015-10-21 MED ORDER — METOLAZONE 5 MG PO TABS
5.0000 mg | ORAL_TABLET | Freq: Every day | ORAL | Status: DC
  Administered 2015-10-21 – 2015-10-22 (×2): 5 mg via ORAL
  Filled 2015-10-21 (×2): qty 1

## 2015-10-21 MED ORDER — INSULIN ASPART 100 UNIT/ML ~~LOC~~ SOLN
9.0000 [IU] | Freq: Three times a day (TID) | SUBCUTANEOUS | Status: DC
  Administered 2015-10-21 – 2015-10-22 (×3): 9 [IU] via SUBCUTANEOUS

## 2015-10-21 NOTE — Progress Notes (Signed)
PROGRESS NOTE  Jerry Cantrell [id number] DOB: 1965/11/29 DOA: 10/13/2015 PCP: Helane Rima, MD  Admit HPI / Brief Narrative: 50 y.o. male with a history of combined systolic and diastolic heart failure, DM2, OSA on BIPAP, and CKD stage III who presented w/ a 1 week history of progressive shortness of breath and pedal edema. He reported increasing his Lasix to 80 mg by mouth twice a day without significant improvement in his volume status. In the ED he was noted to have a BP of 182/98, normal pulse and respirations, and saturation 98 on BiPAP.  Subjective: Feels much better, switch to oral Lasix by nephrology today. Zaroxolyn added. He will be probably okay for discharge in a.m. if it's okay with nephrology.  Assessment/Plan:  Acute respiratory failure with hypoxemia due to acute pulmonary edema Presented with acute pulmonary edema likely secondary to diastolic CHF and CKD stage III. This is improving but patient is still require supplemental oxygen. Stable in 2-3 L of oxygen through nasal cannula.  Malignant Hypertension Difficult to control blood pressure, appreciate cardiology's help. Patient is fluid overloaded, likely volume is contributing to the difficult to control blood pressure I think volume overload and OSA contributing side-by-side to his malignant hypertension. Continue diuresis and CPAP. Minoxidil discontinued, blood pressure improved after aggressive diuresis and removal of 11.5 L.  Acute on chronic diastolic CHF  Decompensation due to cocaine abuse in volume overloaded. Chest x-ray showed some improvement. Continue current diuretics dose, -6 L since admission, weight down at least 12 pounds.  Chronic kidney disease stage III This is around baseline, still has significant volume overloaded. Appreciate nephrology's help, on Lasix switched to 160 mg twice a day with metolazone .  DM2 w/ diabetic neuropathy  A1C 8.9 on 11/2013 - CBG now very well controlled, w/  some hypoglycemia early this morning - reduce insulin dose and follow   Cocaine abuse  Patient has been counseled on the absolute requirement that he discontinue cocaine use completely  Dysuria  UA not c/w UTI  Tobacco abuse patient has been counseled on the absolute need to discontinue tobacco abuse  Obesity - Body mass index is 39.03 kg/(m^2).   OSA/obesity Hypoventilation syndrome -Continue BiPAP at night and w/ naps   Diffuse rash  -No clear etiology on exam - is subacute - distribution not c/w scabies - lesions not suggestive of bed bugs - atarax prn - consider outpt Derm eval for bx if persists     Code Status: FULL Family Communication: no family present at time of exam Disposition Plan: stable for transfer to tele bed - cont diuresis as renal fxn allows   Consultants: [facility] , signed off. Nephrology.   Procedures: TTE - EF 60-65% - no WMA - grade 1 DD  Antibiotics: Azithromycin 2/12 > 2/13 Ceftriaxone 2/12 > 2/13  DVT prophylaxis: SQ heparin   Objective: Blood pressure 131/72, pulse 65, temperature 97.6 F (36.4 C), temperature source Oral, resp. rate 18, height 6\' 1"  (1.854 m), weight 134.174 kg (295 lb 12.8 oz), SpO2 99 %.  Intake/Output Summary (Last 24 hours) at 10/21/15 1251 Last data filed at 10/21/15 1127  Gross per 24 hour  Intake   1440 ml  Output   2925 ml  Net  -1485 ml   Exam: General: No acute respiratory distress evident Lungs: mild bibasilar crackles persist - no wheezing  Cardiovascular: Regular rate and rhythm without murmur gallop or rub = Abdomen: Nontender, obese, soft, bowel sounds positive, no rebound, no ascites, no  appreciable mass Extremities: No significant cyanosis, or clubbing;  1+ edema bilateral lower extremities persists  Data Reviewed:  Basic Metabolic Panel:  Recent Labs Lab 10/17/15 0540 10/18/15 0420 10/19/15 0542 10/20/15 0331 10/21/15 0517  NA 144 145 143 144 144  K 4.0 3.6 3.7 3.8 3.7  CL  100* 99* 96* 98* 98*  CO2 33* 36* 35* 38* 37*  GLUCOSE 179* 143* 242* 138* 166*  BUN 28* 32* 38* 40* 43*  CREATININE 2.57* 2.70* 2.84* 2.86* 2.97*  CALCIUM 8.9 8.8* 8.7* 8.7* 8.7*    CBC:  Recent Labs Lab 10/15/15 0304 10/17/15 0540  WBC 8.4 7.0  HGB 13.3 13.1  HCT 45.2 43.0  MCV 95.2 93.5  PLT 190 162    Liver Function Tests:  Recent Labs Lab 10/15/15 0304  AST 11*  ALT 10*  ALKPHOS 110  BILITOT 0.4  PROT 5.8*  ALBUMIN 2.2*    Cardiac Enzymes: No results for input(s): CKTOTAL, CKMB, CKMBINDEX, TROPONINI in the last 168 hours.  CBG:  Recent Labs Lab 10/20/15 1221 10/20/15 1653 10/20/15 2056 10/21/15 0605 10/21/15 1126  GLUCAP 184* 270* 301* 135* 162*    Recent Results (from the past 240 hour(s))  Culture, blood (routine x 2) Call MD if unable to obtain prior to antibiotics being given     Status: None   Collection Time: 10/13/15  4:35 PM  Result Value Ref Range Status   Specimen Description BLOOD RIGHT ANTECUBITAL  Final   Special Requests BOTTLES DRAWN AEROBIC AND ANAEROBIC 5CC  Final   Culture NO GROWTH 5 DAYS  Final   Report Status 10/18/2015 FINAL  Final  Culture, blood (routine x 2) Call MD if unable to obtain prior to antibiotics being given     Status: None   Collection Time: 10/13/15  4:40 PM  Result Value Ref Range Status   Specimen Description BLOOD RIGHT HAND  Final   Special Requests BOTTLES DRAWN AEROBIC AND ANAEROBIC 5CC  Final   Culture NO GROWTH 5 DAYS  Final   Report Status 10/18/2015 FINAL  Final  MRSA PCR Screening     Status: None   Collection Time: 10/13/15  6:38 PM  Result Value Ref Range Status   MRSA by PCR NEGATIVE NEGATIVE Final    Comment:        The GeneXpert MRSA Assay (FDA approved for NASAL specimens only), is one component of a comprehensive MRSA colonization surveillance program. It is not intended to diagnose MRSA infection nor to guide or monitor treatment for MRSA infections.      Studies:   Recent  x-ray studies have been reviewed in detail by the Attending Physician  Scheduled Meds:  Scheduled Meds: . amitriptyline  50 mg Oral QHS  . amLODipine  5 mg Oral Daily  . aspirin  325 mg Oral Daily  . atorvastatin  80 mg Oral Daily  . carvedilol  25 mg Oral BID WC  . cloNIDine  0.3 mg Oral TID  . docusate sodium  200 mg Oral Daily  . fluticasone  2 spray Each Nare Daily  . furosemide  160 mg Oral BID  . gabapentin  300 mg Oral Daily  . gabapentin  600 mg Oral QHS  . heparin  5,000 Units Subcutaneous 3 times per day  . hydrALAZINE  100 mg Oral TID  . insulin aspart  0-9 Units Subcutaneous TID WC  . insulin aspart  7 Units Subcutaneous TID WC  . insulin glargine  35 Units Subcutaneous  Daily  . metolazone  5 mg Oral Daily  . potassium chloride  20 mEq Oral BID  . senna  1 tablet Oral Daily    Time spent on care of this patient: 35 mins   Birdie Hopes , MD   [facility] Office  [phone] Pager - Text Page per Shea Evans as per below:  On-Call/Text Page:      [email or web address]      password [email or web address]  If 7PM-7AM, please contact night-coverage [email or web address] Password [email or web address] 10/21/2015, 12:51 PM   LOS: 8 days

## 2015-10-21 NOTE — Progress Notes (Signed)
Subjective:  Made 2600 of urine on 100 mg q 6 of lasix IV- kidney function pretty stable - he tells me he wants to get on the kidney list ASAP- denies cocaine use  Objective Vital signs in last 24 hours: Filed Vitals:   10/20/15 1919 10/20/15 2049 10/21/15 0614 10/21/15 0800  BP:  136/80 146/76 156/80  Pulse: 75 72 68 77  Temp:  97.6 F (36.4 C) 98 F (36.7 C) 97.4 F (36.3 C)  TempSrc:  Oral Oral Oral  Resp: 18 18 20    Height:      Weight:   134.174 kg (295 lb 12.8 oz)   SpO2: 96% 94% 100% 96%   Weight change: -0.907 kg (-2 lb)  Intake/Output Summary (Last 24 hours) at 10/21/15 0933 Last data filed at 10/21/15 0920  Gross per 24 hour  Intake   1440 ml  Output   2825 ml  Net  -1385 ml    Assessment/ Plan: Pt is a 50 y.o. yo male with diabetic nephropathy baseline creatinine of mid 2's- who was admitted on 10/13/2015 with  SOB/HTN felt to be due to volume overload but also treated for bronchospasm and PNA  Assessment/Plan: 1. CHF/vol- EF normal- maybe some right sided issue and OSA- is diuresing on IV lasix- need to change this to a regimen of PO meds so that he can maintain at home- will change him to PO lasix with metolazone 2. CKD- due to DM and HTN- seems to be at baseline renal function and is not uremic-  I told him that his renal function is nto quite bad enough to send him for transplant eval and he needs to be more tuned up before that would happen and that cocaine use would make transplant not possible - not uremic and no indications for HD  3. Anemia- not an issue 4. Secondary hyperparathyroidism- will check PTH, phos 5. HTN/volume- is overloaded- change lasix as above- also on coreg, amlodipine, clonidine and hydralazine- BP stable at this time- as volume status improves may be able to wean clonidine 6. Hypokalemia- OK to continue repletion-   Dreama Kuna A    Labs: Basic Metabolic Panel:  Recent Labs Lab 10/19/15 0542 10/20/15 0331 10/21/15 0517  NA  143 144 144  K 3.7 3.8 3.7  CL 96* 98* 98*  CO2 35* 38* 37*  GLUCOSE 242* 138* 166*  BUN 38* 40* 43*  CREATININE 2.84* 2.86* 2.97*  CALCIUM 8.7* 8.7* 8.7*   Liver Function Tests:  Recent Labs Lab 10/15/15 0304  AST 11*  ALT 10*  ALKPHOS 110  BILITOT 0.4  PROT 5.8*  ALBUMIN 2.2*   No results for input(s): LIPASE, AMYLASE in the last 168 hours. No results for input(s): AMMONIA in the last 168 hours. CBC:  Recent Labs Lab 10/15/15 0304 10/17/15 0540  WBC 8.4 7.0  HGB 13.3 13.1  HCT 45.2 43.0  MCV 95.2 93.5  PLT 190 162   Cardiac Enzymes: No results for input(s): CKTOTAL, CKMB, CKMBINDEX, TROPONINI in the last 168 hours. CBG:  Recent Labs Lab 10/20/15 0644 10/20/15 1221 10/20/15 1653 10/20/15 2056 10/21/15 0605  GLUCAP 114* 184* 270* 301* 135*    Iron Studies: No results for input(s): IRON, TIBC, TRANSFERRIN, FERRITIN in the last 72 hours. Studies/Results: No results found. Medications: Infusions:    Scheduled Medications: . amitriptyline  50 mg Oral QHS  . amLODipine  5 mg Oral Daily  . aspirin  325 mg Oral Daily  . atorvastatin  80  mg Oral Daily  . carvedilol  25 mg Oral BID WC  . cloNIDine  0.3 mg Oral TID  . docusate sodium  200 mg Oral Daily  . fluticasone  2 spray Each Nare Daily  . furosemide  100 mg Intravenous Q6H  . gabapentin  300 mg Oral Daily  . gabapentin  600 mg Oral QHS  . heparin  5,000 Units Subcutaneous 3 times per day  . hydrALAZINE  100 mg Oral TID  . insulin aspart  0-9 Units Subcutaneous TID WC  . insulin aspart  7 Units Subcutaneous TID WC  . insulin glargine  35 Units Subcutaneous Daily  . potassium chloride  20 mEq Oral BID  . senna  1 tablet Oral Daily    have reviewed scheduled and prn medications.  Physical Exam: General: NAD- talkative Heart: RRR Lungs: mostly clear Abdomen: soft, non tender Extremities: pitting edema     10/21/2015,9:33 AM  LOS: 8 days

## 2015-10-22 LAB — GLUCOSE, CAPILLARY
Glucose-Capillary: 186 mg/dL — ABNORMAL HIGH (ref 65–99)
Glucose-Capillary: 368 mg/dL — ABNORMAL HIGH (ref 65–99)

## 2015-10-22 LAB — RENAL FUNCTION PANEL
ANION GAP: 10 (ref 5–15)
Albumin: 2.6 g/dL — ABNORMAL LOW (ref 3.5–5.0)
BUN: 43 mg/dL — ABNORMAL HIGH (ref 6–20)
CHLORIDE: 95 mmol/L — AB (ref 101–111)
CO2: 37 mmol/L — AB (ref 22–32)
Calcium: 9 mg/dL (ref 8.9–10.3)
Creatinine, Ser: 3.2 mg/dL — ABNORMAL HIGH (ref 0.61–1.24)
GFR calc non Af Amer: 21 mL/min — ABNORMAL LOW (ref >60)
GFR, EST AFRICAN AMERICAN: 24 mL/min — AB (ref >60)
Glucose, Bld: 194 mg/dL — ABNORMAL HIGH (ref 65–99)
Phosphorus: 5.6 mg/dL — ABNORMAL HIGH (ref 2.5–4.6)
Potassium: 4 mmol/L (ref 3.5–5.1)
Sodium: 142 mmol/L (ref 135–145)

## 2015-10-22 MED ORDER — HYDROXYZINE HCL 25 MG PO TABS: 25.0000 mg | ORAL_TABLET | Freq: Three times a day (TID) | ORAL | Status: DC | PRN

## 2015-10-22 MED ORDER — FUROSEMIDE 80 MG PO TABS: 160.0000 mg | ORAL_TABLET | Freq: Two times a day (BID) | ORAL | Status: DC

## 2015-10-22 MED ORDER — CLONIDINE HCL 0.3 MG PO TABS: 0.3000 mg | ORAL_TABLET | Freq: Three times a day (TID) | ORAL | Status: DC

## 2015-10-22 MED ORDER — POTASSIUM CHLORIDE ER 20 MEQ PO TBCR
20.0000 meq | EXTENDED_RELEASE_TABLET | Freq: Two times a day (BID) | ORAL | Status: DC
Start: 2015-10-22 — End: 2017-05-10

## 2015-10-22 MED ORDER — METOLAZONE 5 MG PO TABS: 5.0000 mg | ORAL_TABLET | Freq: Every day | ORAL | Status: DC

## 2015-10-22 MED ORDER — GABAPENTIN 300 MG PO CAPS: 300.0000 mg | ORAL_CAPSULE | Freq: Three times a day (TID) | ORAL | Status: DC

## 2015-10-22 MED ORDER — AMLODIPINE BESYLATE 5 MG PO TABS: 5.0000 mg | ORAL_TABLET | Freq: Every day | ORAL | Status: DC

## 2015-10-22 MED ORDER — HYDRALAZINE HCL 100 MG PO TABS: 100.0000 mg | ORAL_TABLET | Freq: Three times a day (TID) | ORAL | Status: DC

## 2015-10-22 NOTE — Discharge Summary (Signed)
Physician Discharge Summary  Jerry Cantrell [id number] DOB: December 21, 1965 DOA: 10/13/2015  PCP: Helane Rima, MD  Admit date: 10/13/2015 Discharge date: 10/22/2015  Time spent: 40* minutes  Recommendations for Outpatient Follow-up:  1. Follow-up with Dr. Justin Mend on 11/05/2015. 2. Check BMP on Friday.   Discharge Diagnoses:  Principal Problem:   Acute on chronic diastolic CHF (congestive heart failure), NYHA class 4 (HCC) Active Problems:   Hyperlipemia   Obstructive sleep apnea   Acute on chronic renal insufficiency (HCC)   Cocaine abuse   Legally blind   Acute respiratory failure with hypoxia (HCC)   Essential hypertension, malignant   Obesity hypoventilation syndrome (HCC)   CKD (chronic kidney disease), stage III   Type 2 diabetes, uncontrolled, with neuropathy ([place])   Marijuana abuse   Tobacco abuse   Acute on chronic congestive heart failure ([place])   Discharge Condition: Stable  Diet recommendation: Carbohydrate modified diet with renal diet and fluid restriction.  Filed Weights   10/20/15 0504 10/21/15 0614 10/22/15 0559  Weight: 135.081 kg (297 lb 12.8 oz) 134.174 kg (295 lb 12.8 oz) 133.176 kg (293 lb 9.6 oz)    History of present illness:  Jerry Cantrell is a 50 y.o. male With a history of combined systolic and diastolic heart failure, DM2 insulin-dependent, OSA on BiPaP, CKD stage IIIB, presenting to the hospital on 1 week history of progressive shortness of breath, with increasing dyspnea on exertion, progressing to orthopnea and pedal edema. He reports having gained 11 pounds since January 23/2017. He reports that during the last 2 months, he has in been increasing his Lasix demands, to 80 mg by mouth twice a day on admission, without significant improvement of his volume status. The patient denies any chest pain, but he does have mild productive cough, which is chronic. He has also noted significant leg swelling over the last week. He has not been able to ambulate as  much due to shortness of breath. He denies any hemoptysis. He denies any recent cocaine use. He continues to smoke up to one pack a day. He also complains of increasing abdominal girth. The patient reports that he is cardiologist changed his hydralazine from 10 to 25 mg 3 times a day, And also added minoxidil 5 mg daily. Denies any sick contacts. At the ED, he was noted to have significant hypertension, currently at 182/98, normal pulse and respirations, his saturation is 98 on BiPAP. Cardiology evaluation was obtained, ordering Lasix 80 mg IV twice a day, with close renal function monitoring. IV nitroglycerin was added as well. 2-D echo is pending. Of note, his BNP is minimally elevated at 252. His blood pressure home medications were resumed. In addition, a chest x-ray noted patient to have underlying, superimposed pneumonia in the setting of CHF. Procalcitonin is pending  Hospital Course:   Acute respiratory failure with hypoxemia due to acute pulmonary edema Presented with acute pulmonary edema likely secondary to diastolic CHF and CKD stage III. This is improved very well, day of discharge patient is on room air. Please note that patient has required oxygen at home prior to admission.  Malignant Hypertension Difficult to control blood pressure, appreciate cardiology's help. Patient is fluid overloaded, likely volume is contributing to the difficult to control blood pressure I think volume overload and OSA contributing side-by-side to his malignant hypertension. Continue diuresis and CPAP. Medication adjusted by nephrology and cardiology, blood pressure control improved.  Acute on chronic diastolic CHF  Decompensation due to cocaine abuse in volume overloaded. Chest  x-ray showed some improvement.  Patient lost 17 pounds of his weight and -13 L at the time of discharge. She medication list for new medications.  Chronic kidney disease stage III This is around baseline, still has  significant volume overloaded. Appreciate nephrology's help, on Lasix switched to 160 mg twice a day with 5 mg of metolazone .  DM2 w/ diabetic neuropathy  A1C 8.9 on 11/2013 - CBG now very well controlled, w/ some hypoglycemia early this morning - reduce insulin dose and follow   Cocaine abuse  Patient has been counseled on the absolute requirement that he discontinue cocaine use completely  Dysuria  UA not c/w UTI  Tobacco abuse patient has been counseled on the absolute need to discontinue tobacco abuse  Obesity - Body mass index is 39.03 kg/(m^2).   OSA/obesity Hypoventilation syndrome -Continue CPAP at night  Diffuse rash  -No clear etiology on exam - is subacute - distribution not c/w scabies - lesions not suggestive of bed bugs - atarax prn. .   Procedures:  2-D echo done on 2013 2017 Study Conclusions  - Left ventricle: The cavity size was normal. There was severe concentric hypertrophy. Systolic function was normal. The estimated ejection fraction was in the range of 60% to 65%. Wall motion was normal; there were no regional wall motion abnormalities. There was an increased relative contribution of atrial contraction to ventricular filling. Doppler parameters are consistent with abnormal left ventricular relaxation (grade 1 diastolic dysfunction). - Right ventricle: The cavity size was mildly dilated. Wall thickness was normal.  Consultations:  Cardiology.  Nephrology.  Discharge Exam: Filed Vitals:   10/21/15 2011 10/22/15 0606  BP: 150/76 160/80  Pulse: 63 73  Temp: 98 F (36.7 C)   Resp: 20 18  General: Alert and awake, oriented x3, not in any acute distress. HEENT: anicteric sclera, pupils reactive to light and accommodation, EOMI CVS: S1-S2 clear, no murmur rubs or gallops Chest: clear to auscultation bilaterally, no wheezing, rales or rhonchi Abdomen: soft nontender, nondistended, normal bowel sounds, no  organomegaly Extremities: no cyanosis, clubbing or edema noted bilaterally Neuro: Cranial nerves II-XII intact, no focal neurological deficits  Discharge Instructions   Discharge Instructions    Diet - low sodium heart healthy    Complete by:  As directed      Increase activity slowly    Complete by:  As directed           Current Discharge Medication List    START taking these medications   Details  amLODipine (NORVASC) 5 MG tablet Take 1 tablet (5 mg total) by mouth daily. Qty: 30 tablet, Refills: 0    hydrOXYzine (ATARAX/VISTARIL) 25 MG tablet Take 1 tablet (25 mg total) by mouth 3 (three) times daily as needed for itching. Qty: 30 tablet, Refills: 0    metolazone (ZAROXOLYN) 5 MG tablet Take 1 tablet (5 mg total) by mouth daily. Qty: 30 tablet, Refills: 0      CONTINUE these medications which have CHANGED   Details  cloNIDine (CATAPRES) 0.3 MG tablet Take 1 tablet (0.3 mg total) by mouth 3 (three) times daily. Qty: 90 tablet, Refills: 0    furosemide (LASIX) 80 MG tablet Take 2 tablets (160 mg total) by mouth 2 (two) times daily. Qty: 120 tablet, Refills: 1    gabapentin (NEURONTIN) 300 MG capsule Take 1 capsule (300 mg total) by mouth 3 (three) times daily. 200 mg in the morning and 100 mg in the evening. Qty: 90 capsule,  Refills: 0    hydrALAZINE (APRESOLINE) 100 MG tablet Take 1 tablet (100 mg total) by mouth 3 (three) times daily. Qty: 90 tablet, Refills: 1    Potassium Chloride ER 20 MEQ TBCR 20 mEq by Per post-pyloric tube route 2 (two) times daily. Qty: 60 tablet, Refills: 1      CONTINUE these medications which have NOT CHANGED   Details  albuterol (PROVENTIL HFA;VENTOLIN HFA) 108 (90 BASE) MCG/ACT inhaler Inhale 2 puffs into the lungs every 4 (four) hours as needed for wheezing or shortness of breath. Qty: 1 Inhaler, Refills: 6   Associated Diagnoses: Cough    amitriptyline (ELAVIL) 50 MG tablet Take 1 tablet (50 mg total) by mouth at bedtime. Qty:  30 tablet, Refills: 1    aspirin 325 MG tablet Take 1 tablet (325 mg total) by mouth daily. Qty: 30 tablet, Refills: 0    atorvastatin (LIPITOR) 80 MG tablet Take 1 tablet (80 mg total) by mouth daily. Qty: 30 tablet, Refills: 0    carvedilol (COREG) 25 MG tablet Take 1 tablet (25 mg total) by mouth 2 (two) times daily with a meal. Qty: 28 tablet, Refills: 0    insulin aspart (NOVOLOG) 100 UNIT/ML injection Inject into the skin. Inject into the skin as directed based on sliding scale daily    insulin glargine (LANTUS) 100 UNIT/ML injection Inject into the skin 2 (two) times daily. Inject 45 units into the skin daily in the morning. Inject 35 units into the skin daily at night    nitroGLYCERIN (NITROSTAT) 0.4 MG SL tablet Place 1 tablet (0.4 mg total) under the tongue every 5 (five) minutes as needed for chest pain. Qty: 25 tablet, Refills: prn    Polyvinyl Alcohol-Povidone (REFRESH OP) Place 2 drops into both eyes 3 (three) times daily as needed (dry eyes).      STOP taking these medications     minoxidil (LONITEN) 2.5 MG tablet      verapamil (CALAN-SR) 120 MG CR tablet        No Known Allergies Follow-up Information    Follow up with Sherril Croon, MD On 11/05/2015.   Specialty:  Nephrology   Why:  Appt is for 1:30 PM    Contact information:   [place] [address] [phone]        The results of significant diagnostics from this hospitalization (including imaging, microbiology, ancillary and laboratory) are listed below for reference.    Significant Diagnostic Studies: Dg Chest 2 View  10/18/2015  CLINICAL DATA:  Pulmonary edema EXAM: CHEST  2 VIEW COMPARISON:  10/14/2015 FINDINGS: Cardiomegaly again noted. Central mild vascular congestion without convincing pulmonary edema. Persistent small right pleural effusion right basilar atelectasis or infiltrate. Mild left basilar atelectasis. Slight improvement in aeration. IMPRESSION: Central mild vascular  congestion without convincing pulmonary edema. Slight improvement in aeration. Persistent small right pleural effusion with right basilar atelectasis or infiltrate. Mild left basilar atelectasis. Electronically Signed   By: Lahoma Crocker M.D.   On: 10/18/2015 09:31   Dg Chest Port 1 View  10/14/2015  CLINICAL DATA:  Pneumonia, followup, history hypertension, diabetes mellitus, CHF EXAM: PORTABLE CHEST 1 VIEW COMPARISON:  Portable exam 0721 hours compared to 10/13/2015 FINDINGS: Enlargement of cardiac silhouette with pulmonary vascular congestion. Perihilar and infiltrates bilaterally compatible with pulmonary edema and CHF, asymmetrically greater on RIGHT, not significantly changed. Probable RIGHT pleural effusion and basilar atelectasis. No pneumothorax. Endplate spur formation thoracic spine. IMPRESSION: Persistent CHF with RIGHT pleural effusion and  basilar atelectasis, little changed. Electronically Signed   By: Lavonia Dana M.D.   On: 10/14/2015 08:07   Dg Chest Port 1 View  10/13/2015  CLINICAL DATA:  Shortness of breath and chest pain EXAM: PORTABLE CHEST 1 VIEW COMPARISON:  August 07, 2015 FINDINGS: There is interstitial edema with cardiomegaly and pulmonary venous hypertension. There is extensive airspace consolidation in the right middle and lower lobes with right pleural effusion. No adenopathy evident. IMPRESSION: Evidence of congestive heart failure. Probable superimposed pneumonia right middle and lower lobes given the degree of asymmetric consolidation in these areas. Followup PA and lateral chest radiographs recommended in 3-4 weeks following trial of antibiotic therapy to ensure resolution and exclude underlying malignancy. Electronically Signed   By: Lowella Grip III M.D.   On: 10/13/2015 12:39    Microbiology: Recent Results (from the past 240 hour(s))  Culture, blood (routine x 2) Call MD if unable to obtain prior to antibiotics being given     Status: None   Collection Time:  10/13/15  4:35 PM  Result Value Ref Range Status   Specimen Description BLOOD RIGHT ANTECUBITAL  Final   Special Requests BOTTLES DRAWN AEROBIC AND ANAEROBIC 5CC  Final   Culture NO GROWTH 5 DAYS  Final   Report Status 10/18/2015 FINAL  Final  Culture, blood (routine x 2) Call MD if unable to obtain prior to antibiotics being given     Status: None   Collection Time: 10/13/15  4:40 PM  Result Value Ref Range Status   Specimen Description BLOOD RIGHT HAND  Final   Special Requests BOTTLES DRAWN AEROBIC AND ANAEROBIC 5CC  Final   Culture NO GROWTH 5 DAYS  Final   Report Status 10/18/2015 FINAL  Final  MRSA PCR Screening     Status: None   Collection Time: 10/13/15  6:38 PM  Result Value Ref Range Status   MRSA by PCR NEGATIVE NEGATIVE Final    Comment:        The GeneXpert MRSA Assay (FDA approved for NASAL specimens only), is one component of a comprehensive MRSA colonization surveillance program. It is not intended to diagnose MRSA infection nor to guide or monitor treatment for MRSA infections.      Labs: Basic Metabolic Panel:  Recent Labs Lab 10/18/15 0420 10/19/15 0542 10/20/15 0331 10/21/15 0517 10/22/15 0533  NA 145 143 144 144 142  K 3.6 3.7 3.8 3.7 4.0  CL 99* 96* 98* 98* 95*  CO2 36* 35* 38* 37* 37*  GLUCOSE 143* 242* 138* 166* 194*  BUN 32* 38* 40* 43* 43*  CREATININE 2.70* 2.84* 2.86* 2.97* 3.20*  CALCIUM 8.8* 8.7* 8.7* 8.7* 9.0  PHOS  --   --   --   --  5.6*   Liver Function Tests:  Recent Labs Lab 10/22/15 0533  ALBUMIN 2.6*   No results for input(s): LIPASE, AMYLASE in the last 168 hours. No results for input(s): AMMONIA in the last 168 hours. CBC:  Recent Labs Lab 10/17/15 0540  WBC 7.0  HGB 13.1  HCT 43.0  MCV 93.5  PLT 162   Cardiac Enzymes: No results for input(s): CKTOTAL, CKMB, CKMBINDEX, TROPONINI in the last 168 hours. BNP: BNP (last 3 results)  Recent Labs  06/06/15 1325 08/07/15 1207 10/13/15 1130  BNP 295.0*  175.3* 252.7*    ProBNP (last 3 results)  Recent Labs  07/16/15 1158  PROBNP 225.0*    CBG:  Recent Labs Lab 10/21/15 1626 10/21/15 2050 10/21/15 2137  10/22/15 0602 10/22/15 1059  GLUCAP 145* 48* 89 186* 368*       Signed:  Cheron Pasquarelli A MD.  [facility] 10/22/2015, 11:51 AM

## 2015-10-22 NOTE — Progress Notes (Signed)
Pt has orders to be discharged. Discharge instructions given and pt has no additional questions at this time. Medication regimen reviewed and pt educated. Pt verbalized understanding and has no additional questions. Telemetry box removed. IV removed and site in good condition. Pt stable and waiting for transportation.   Rollande Thursby RN 

## 2015-10-22 NOTE — Progress Notes (Signed)
Received message that patient wanted [facility] to go to his home to check his oxygen. TCT Jerry Cantrell with [place], someone will check his oxygen tank today after he is discharged home. Mindi Slicker [facility] [phone]

## 2015-10-22 NOTE — Progress Notes (Signed)
Subjective:  Made 3500 of urine now on 160 mg lasix PO BID and metolazone 5 daily-  kidney function now seems to be worsening - he is not uremic and not getting too dry Objective Vital signs in last 24 hours: Filed Vitals:   10/21/15 1135 10/21/15 2011 10/22/15 0559 10/22/15 0606  BP: 131/72 150/76  160/80  Pulse: 65 63  73  Temp: 97.6 F (36.4 C) 98 F (36.7 C)    TempSrc: Oral Oral    Resp: 18 20  18   Height:      Weight:   133.176 kg (293 lb 9.6 oz)   SpO2: 99% 100%  93%   Weight change: -0.998 kg (-2 lb 3.2 oz)  Intake/Output Summary (Last 24 hours) at 10/22/15 [id number] Last data filed at 10/22/15 [id number]  Gross per 24 hour  Intake   1200 ml  Output   3351 ml  Net  -2151 ml    Assessment/ Plan: Pt is a 50 y.o. yo male with diabetic nephropathy baseline creatinine of mid 2's- who was admitted on 10/13/2015 with  SOB/HTN felt to be due to volume overload but also treated for bronchospasm and PNA  Assessment/Plan: 1. CHF/vol- EF normal- maybe some right sided issue and OSA- is diuresing now on PO lasix/metolazone-  2. CKD- due to DM and HTN- his baseline renal function was in the mid 2's and is not uremic but now has crept into the 3's-  I told him that his renal function is not quite bad enough to send him for transplant eval right now and he needs to be more tuned up before that would happen and that cocaine use would make transplant not possible - not uremic and no indications for HD- can continue to be monitered as OP.  I am OK with patient to be discharged- I have set up labs for Friday from our office and also a follow up with Justin Mend on 3/7  3. Anemia- not an issue 4. Secondary hyperparathyroidism- will check PTH, phos is 5.6 5. HTN/volume- is overloaded- change lasix as above- also on coreg, amlodipine, clonidine and hydralazine- BP stable at this time- as volume status improves may be able to wean clonidine 6. Hypokalemia- OK to continue repletion- would continue as OP 7. Dispo- I  would be OK with discharge- on lasix 160 BID and zaroxolyn 5 daily as well as K 20 BID- I will set up labs through our office for Friday and an appt with Dr. Justin Mend on 3/7 at 1:30 PM- pt aware   Sherrod Toothman A    Labs: Basic Metabolic Panel:  Recent Labs Lab 10/20/15 0331 10/21/15 0517 10/22/15 0533  NA 144 144 142  K 3.8 3.7 4.0  CL 98* 98* 95*  CO2 38* 37* 37*  GLUCOSE 138* 166* 194*  BUN 40* 43* 43*  CREATININE 2.86* 2.97* 3.20*  CALCIUM 8.7* 8.7* 9.0  PHOS  --   --  5.6*   Liver Function Tests:  Recent Labs Lab 10/22/15 0533  ALBUMIN 2.6*   No results for input(s): LIPASE, AMYLASE in the last 168 hours. No results for input(s): AMMONIA in the last 168 hours. CBC:  Recent Labs Lab 10/17/15 0540  WBC 7.0  HGB 13.1  HCT 43.0  MCV 93.5  PLT 162   Cardiac Enzymes: No results for input(s): CKTOTAL, CKMB, CKMBINDEX, TROPONINI in the last 168 hours. CBG:  Recent Labs Lab 10/21/15 1126 10/21/15 1626 10/21/15 2050 10/21/15 2137 10/22/15 0602  GLUCAP 162* 145*  48* 89 186*    Iron Studies: No results for input(s): IRON, TIBC, TRANSFERRIN, FERRITIN in the last 72 hours. Studies/Results: No results found. Medications: Infusions:    Scheduled Medications: . amitriptyline  50 mg Oral QHS  . amLODipine  5 mg Oral Daily  . aspirin  325 mg Oral Daily  . atorvastatin  80 mg Oral Daily  . carvedilol  25 mg Oral BID WC  . cloNIDine  0.3 mg Oral TID  . docusate sodium  200 mg Oral Daily  . fluticasone  2 spray Each Nare Daily  . furosemide  160 mg Oral BID  . gabapentin  300 mg Oral Daily  . gabapentin  600 mg Oral QHS  . heparin  5,000 Units Subcutaneous 3 times per day  . hydrALAZINE  100 mg Oral TID  . insulin aspart  0-9 Units Subcutaneous TID WC  . insulin aspart  9 Units Subcutaneous TID WC  . insulin glargine  35 Units Subcutaneous Daily  . metolazone  5 mg Oral Daily  . potassium chloride  20 mEq Oral BID  . senna  1 tablet Oral Daily     have reviewed scheduled and prn medications.  Physical Exam: General: NAD- talkative Heart: RRR Lungs: mostly clear Abdomen: soft, non tender Extremities: pitting edema     10/22/2015,9:49 AM  LOS: 9 days

## 2015-10-22 NOTE — Progress Notes (Signed)
Inpatient Diabetes Program Recommendations  AACE/ADA: New Consensus Statement on Inpatient Glycemic Control (2015)  Target Ranges:  Prepandial:   less than 140 mg/dL      Peak postprandial:   less than 180 mg/dL (1-2 hours)      Critically ill patients:  140 - 180 mg/dL   Review of Glycemic Control  Results for RAMEL, RODELA (MRN [id number]) as of 10/22/2015 10:04  Ref. Range 10/21/2015 06:05 10/21/2015 11:26 10/21/2015 16:26 10/21/2015 20:50 10/21/2015 21:37  Glucose-Capillary Latest Ref Range: 65-99 mg/dL 135 (H) 1 unit correction and 7 units MC 162 (H) 2 units correction and 7 units MC 145 (H) 1 units correction and 9 units MC (increased) 48 (L) 89    Inpatient Diabetes Program Recommendations:   Glucose has been well controlled using sensitive correction tidwc and 7 units meal coverage tidwc.  Meal coverage increased to 9 units with dinner last evening and had hypoglycemic event at HS of 48 mg/dL  Please consider decrease in meal coverage back to 7 units tidwc. Text paged Dr Hartford Poli requesting  decrease in meal coverage back to 7 units (already given 9 units this am)  Thank you Rosita Kea, RN, MSN, CDE  Diabetes Inpatient Program Office: [phone] Pager: [phone] 8:00 am to 5:00 pm

## 2015-10-23 ENCOUNTER — Telehealth: Payer: Self-pay | Admitting: Internal Medicine

## 2015-10-23 DIAGNOSIS — I10 Essential (primary) hypertension: Secondary | ICD-10-CM

## 2015-10-23 LAB — PARATHYROID HORMONE, INTACT (NO CA): PTH: 107 pg/mL — AB (ref 15–65)

## 2015-10-23 NOTE — Telephone Encounter (Signed)
New message      Calling to follow up regarding telemonitoring

## 2015-10-23 NOTE — Telephone Encounter (Signed)
Spoke with Lelon Frohlich. She is following up on telemonitoring.  I told her I would forward message to Dr. Harrington Challenger and Caren Hazy.

## 2015-10-25 NOTE — Telephone Encounter (Signed)
Spoke with [employer]. They wanted to verify that since discharge patient is not to be taking minoxidil. I verified that it was discontinued according to his dc instructions.

## 2015-10-25 NOTE — Telephone Encounter (Signed)
Per Dr. Harrington Challenger patient needs BMET in 10 days and f/u appointment in 2 weeks. Staff message forwarded to scheduler to schedule.

## 2015-10-25 NOTE — Telephone Encounter (Signed)
Follow up      Talk to a nurse regarding patients medication list.  He was recently discharged from the [facility].

## 2015-10-25 NOTE — Telephone Encounter (Signed)
Reviewed   I have not spoken to pt Just d/c'ed from hosp   Keep on medcines recommended WIll need labs in 10 days F/U in clinic in a 2  Weeks if not already scheduled

## 2015-10-25 NOTE — Addendum Note (Signed)
Addended by: Rodman Key on: 10/25/2015 05:38 PM   Modules accepted: Orders

## 2015-10-28 ENCOUNTER — Telehealth: Payer: Self-pay | Admitting: Gastroenterology

## 2015-10-28 ENCOUNTER — Ambulatory Visit (INDEPENDENT_AMBULATORY_CARE_PROVIDER_SITE_OTHER)
Admission: RE | Admit: 2015-10-28 | Discharge: 2015-10-28 | Disposition: A | Discharge status: Home / Self Care | Payer: Medicaid Other | Source: Ambulatory Visit | Attending: Pulmonary Disease | Admitting: Pulmonary Disease

## 2015-10-28 ENCOUNTER — Ambulatory Visit (INDEPENDENT_AMBULATORY_CARE_PROVIDER_SITE_OTHER): Payer: Medicaid Other | Admitting: Pulmonary Disease

## 2015-10-28 VITALS — BP 138/80 | HR 66 | Ht 71.5 in | Wt 292.8 lb

## 2015-10-28 DIAGNOSIS — J9611 Chronic respiratory failure with hypoxia: Secondary | ICD-10-CM | POA: Diagnosis not present

## 2015-10-28 DIAGNOSIS — R0600 Dyspnea, unspecified: Secondary | ICD-10-CM

## 2015-10-28 DIAGNOSIS — G4733 Obstructive sleep apnea (adult) (pediatric): Secondary | ICD-10-CM

## 2015-10-28 DIAGNOSIS — J189 Pneumonia, unspecified organism: Secondary | ICD-10-CM

## 2015-10-28 DIAGNOSIS — R911 Solitary pulmonary nodule: Secondary | ICD-10-CM | POA: Diagnosis not present

## 2015-10-28 LAB — PULMONARY FUNCTION TEST
DL/VA % PRED: 98 %
DL/VA: 4.63 ml/min/mmHg/L
DLCO COR % PRED: 64 %
DLCO cor: 22.11 ml/min/mmHg
DLCO unc % pred: 59 %
DLCO unc: 20.3 ml/min/mmHg
FEF 25-75 Post: 2.21 L/sec
FEF 25-75 Pre: 2.53 L/sec
FEF2575-%CHANGE-POST: -12 %
FEF2575-%PRED-POST: 61 %
FEF2575-%Pred-Pre: 70 %
FEV1-%CHANGE-POST: -3 %
FEV1-%PRED-PRE: 57 %
FEV1-%Pred-Post: 55 %
FEV1-Post: 2.3 L
FEV1-Pre: 2.38 L
FEV1FVC-%CHANGE-POST: 0 %
FEV1FVC-%Pred-Pre: 105 %
FEV6-%CHANGE-POST: -2 %
FEV6-%PRED-POST: 54 %
FEV6-%PRED-PRE: 56 %
FEV6-Post: 2.81 L
FEV6-Pre: 2.88 L
FEV6FVC-%Change-Post: 0 %
FEV6FVC-%Pred-Post: 103 %
FEV6FVC-%Pred-Pre: 102 %
FVC-%Change-Post: -2 %
FVC-%PRED-POST: 52 %
FVC-%PRED-PRE: 54 %
FVC-POST: 2.81 L
FVC-Pre: 2.88 L
POST FEV6/FVC RATIO: 100 %
PRE FEV1/FVC RATIO: 82 %
Post FEV1/FVC ratio: 82 %
Pre FEV6/FVC Ratio: 100 %
RV % pred: 114 %
RV: 2.43 L
TLC % pred: 74 %
TLC: 5.45 L

## 2015-10-28 NOTE — Progress Notes (Signed)
PFT done today. 10/28/2015 

## 2015-10-28 NOTE — Progress Notes (Signed)
   Subjective:    Patient ID: Jerry Cantrell, male    DOB: 06/04/1966, 50 y.o.   MRN: [id number]  HPI 50 year old male smoker with polysubstance abuse (cocaine) and severe OSA on nocturnal BiPAP, chronic hypoxic respiratory failure on O2 He has chronic diastolic heart failure    10/28/2015  Chief Complaint  Patient presents with  . Follow-up    CXR done today for pnumonia follow up; PFT done today, patient doing well on CPAP, time missed on CPAP is during time he was in hospital with pneumonia.     Adm 2/12-2/21 for Htn, acute CHF -third admit in last 6 months New PCP - [facility]  He is compliant with BiPAP, wonders if he needs oxygen at night-he was set up on oxygen during one of his admits. Remains on IPAP /EPAP 20/5 w/ PS 5cmH20 Download 10/2015 >> AHI 11/h, leak ++, on auo bipap, good usage  He has chronic leg swelling and takes 80 mg of Lasix. He continues to struggle with cocaine usage and cigarettes  CXR today shows - clearing of infx  Significant tests/ events  CT chest 05/2015 -evidence of old granulomatous disease with calcified right hilar lymph nodes, bilateral subcentimeter nodules , some new compared to 02/2010  PSG 01/2015- severe OSA, AHI 99/hour, not corrected by CPAP required BiPAP 25/21 with a full face mask  PFTs 10/2015 >> no obsn, mod restriction  10/2015  2-D echo nml LVEF, grade 1 diastolic dysfunction.  Past Medical History  Diagnosis Date  . Hypertension   . Meralgia paraesthetica 05/03/2012  . Lumbar spondylosis 05/03/2012  . Lumbar spinal stenosis 05/03/2012    Mild with only right L4 nerve root encroachment, no neurogenic claudication   . Urinary incontinence 05/12/2012    Since the start of Sept. 2013   . Hemorrhoids 05/12/2012  . Meralgia paraesthetica 05/03/2012    On Lyrica which does improve pain.   Marland Kitchen Neuropathy in diabetes ([place]) 05/12/2012  . Substance abuse   . Hyperlipidemia   . Pneumonia   . Diabetes mellitus with nephropathy  ([place]) 05/12/2012  . Acute diastolic heart failure ([place])   . Retinopathy   . CHF (congestive heart failure) ([place])   . Obesity      Review of Systems neg for any significant sore throat, dysphagia, itching, sneezing, nasal congestion or excess/ purulent secretions, fever, chills, sweats, unintended wt loss, pleuritic or exertional cp, hempoptysis, orthopnea pnd or change in chronic leg swelling. Also denies presyncope, palpitations, heartburn, abdominal pain, nausea, vomiting, diarrhea or change in bowel or urinary habits, dysuria,hematuria, rash, arthralgias, visual complaints, headache, numbness weakness or ataxia.     Objective:   Physical Exam  Gen. Pleasant, well-nourished, in no distress ENT - no lesions, no post nasal drip, class II airway Neck: No JVD, no thyromegaly, no carotid bruits Lungs: no use of accessory muscles, no dullness to percussion, clear without rales or rhonchi  Cardiovascular: Rhythm regular, heart sounds  normal, no murmurs or gallops, 2+ peripheral edema Musculoskeletal: No deformities, no cyanosis or clubbing         Assessment & Plan:

## 2015-10-28 NOTE — Telephone Encounter (Signed)
Received records from [facility] and placed on Dr. Corena Pilgrim desk for review.

## 2015-10-28 NOTE — Telephone Encounter (Signed)
Dr. Loletha Carrow reviewed records and has accepted patient. Ok to schedule Direct colon. Colon needs to be scheduled at the hospital. Records given to [facility] to schedule.

## 2015-10-28 NOTE — Patient Instructions (Signed)
CT chest no COntrast to FU on nodules ONO on biPAP/ RA  To check on need for O2 Try to QUIt smoking ! Continue using Bpap - you are doing well on this

## 2015-10-29 ENCOUNTER — Encounter: Payer: Self-pay | Admitting: Pulmonary Disease

## 2015-10-29 NOTE — Assessment & Plan Note (Signed)
ONO on biPAP/ RA  To check on need for O2 Try to QUIt smoking

## 2015-10-29 NOTE — Assessment & Plan Note (Signed)
Continue auto BiPAP settings he has good compliance and has reasonable control of events  Weight loss encouraged, compliance with goal of at least 6 hrs every night is the expectation. Advised against medications with sedative side effects Cautioned against driving when sleepy - understanding that sleepiness will vary on a day to day basis

## 2015-10-29 NOTE — Assessment & Plan Note (Signed)
Repeat CT chest without contrast as a six-month follow-up

## 2015-10-31 ENCOUNTER — Telehealth: Payer: Self-pay

## 2015-10-31 NOTE — Telephone Encounter (Signed)
Dr Loletha Carrow you requested labs on this pt to determine which prep to use for recall colon at the hospital, her last labs were 07/2015.  Do you want me to have her come in and get a BMET or creatinine level?

## 2015-10-31 NOTE — Telephone Encounter (Signed)
Thanks for letting me know that he has labs from 07/2015.  His creatinine then was 3.2, which means he needs a golytely prep  No need for repeat labs now.

## 2015-11-01 ENCOUNTER — Other Ambulatory Visit (INDEPENDENT_AMBULATORY_CARE_PROVIDER_SITE_OTHER): Payer: Medicaid Other | Admitting: *Deleted

## 2015-11-01 ENCOUNTER — Ambulatory Visit (INDEPENDENT_AMBULATORY_CARE_PROVIDER_SITE_OTHER)
Admission: RE | Admit: 2015-11-01 | Discharge: 2015-11-01 | Disposition: A | Discharge status: Home / Self Care | Payer: Medicaid Other | Source: Ambulatory Visit | Attending: Pulmonary Disease | Admitting: Pulmonary Disease

## 2015-11-01 DIAGNOSIS — J189 Pneumonia, unspecified organism: Secondary | ICD-10-CM | POA: Diagnosis not present

## 2015-11-01 DIAGNOSIS — I10 Essential (primary) hypertension: Secondary | ICD-10-CM

## 2015-11-01 DIAGNOSIS — E785 Hyperlipidemia, unspecified: Secondary | ICD-10-CM | POA: Diagnosis not present

## 2015-11-01 LAB — BASIC METABOLIC PANEL
BUN: 60 mg/dL — ABNORMAL HIGH (ref 7–25)
CHLORIDE: 95 mmol/L — AB (ref 98–110)
CO2: 34 mmol/L — AB (ref 20–31)
CREATININE: 3.5 mg/dL — AB (ref 0.70–1.33)
Calcium: 8.6 mg/dL (ref 8.6–10.3)
Glucose, Bld: 119 mg/dL — ABNORMAL HIGH (ref 65–99)
Potassium: 3.7 mmol/L (ref 3.5–5.3)
Sodium: 141 mmol/L (ref 135–146)

## 2015-11-01 NOTE — Addendum Note (Signed)
Addended by: Eulis Foster on: 11/01/2015 01:04 PM   Modules accepted: Orders

## 2015-11-07 ENCOUNTER — Other Ambulatory Visit: Payer: Self-pay

## 2015-11-07 DIAGNOSIS — Z800 Family history of malignant neoplasm of digestive organs: Secondary | ICD-10-CM

## 2015-11-07 NOTE — Telephone Encounter (Signed)
12/17/15 previsit 12/24/15 [facility] colon scheduled.  Hospital orders and case in EPIC.  Pt aware of appts

## 2015-11-08 ENCOUNTER — Encounter: Payer: Self-pay | Admitting: Internal Medicine

## 2015-11-08 ENCOUNTER — Ambulatory Visit (INDEPENDENT_AMBULATORY_CARE_PROVIDER_SITE_OTHER): Payer: Medicaid Other | Admitting: Internal Medicine

## 2015-11-08 VITALS — BP 179/98 | HR 89 | Ht 71.5 in | Wt 268.4 lb

## 2015-11-08 DIAGNOSIS — I110 Hypertensive heart disease with heart failure: Secondary | ICD-10-CM | POA: Diagnosis not present

## 2015-11-08 DIAGNOSIS — I5030 Unspecified diastolic (congestive) heart failure: Secondary | ICD-10-CM

## 2015-11-08 DIAGNOSIS — I10 Essential (primary) hypertension: Secondary | ICD-10-CM | POA: Diagnosis not present

## 2015-11-08 LAB — BASIC METABOLIC PANEL
BUN: 64 mg/dL — AB (ref 7–25)
CHLORIDE: 95 mmol/L — AB (ref 98–110)
CO2: 33 mmol/L — ABNORMAL HIGH (ref 20–31)
Calcium: 9.2 mg/dL (ref 8.6–10.3)
Creat: 3.44 mg/dL — ABNORMAL HIGH (ref 0.70–1.33)
Glucose, Bld: 390 mg/dL — ABNORMAL HIGH (ref 65–99)
POTASSIUM: 4.1 mmol/L (ref 3.5–5.3)
SODIUM: 141 mmol/L (ref 135–146)

## 2015-11-08 MED ORDER — HYDROXYZINE HCL 25 MG PO TABS: 25.0000 mg | ORAL_TABLET | Freq: Three times a day (TID) | ORAL | Status: DC | PRN

## 2015-11-08 NOTE — Patient Instructions (Signed)
Your physician recommends that you continue on your current medications as directed. Please refer to the Current Medication list given to you today. Your physician recommends that you return for lab work TODAY (SED RATE, BMET, BNP)

## 2015-11-08 NOTE — Progress Notes (Signed)
Cardiology Office Note   Date:  11/08/2015   ID:  Jerry Cantrell, DOB 27-Nov-1965, MRN [id number]  PCP:  Elyn Peers, MD  Cardiologist:   Dorris Carnes, MD   Patient presents for f/u of diastolic CHF, CP    History of Present Illness: Jerry Cantrell is a 50 y.o. male with a history of CAP & resp failure, sepsis, ARF, acute encephalopathy in 2015. Hx HTN, OSA compliant with CPAP, DM2, D-CHF, HL, tob use. He was admitted 05/13/15 w/ sharp chest pain in the setting of cocaine use, volume overload He was diuresed with IV lasix and transitioned to PO,  He was seen by B Rosita Fire in November 2016 A stess myovue afer showed normal perfusion LVEF 35%  The patient was admitted to [facility] on 2/12-2/21 for SOB  Treated for diastoic CHF with IV last.  Echo on d/c LVEF 60 to 65% with gr I diastolic dysfunction    Since d/c his breathing is fair  Denies CP  No PND  Says he is walching what he eats.  Lives in 2 story home  Up/down stairs to BR mult times per day due to lasix  Would fell better on one floor.  Seen in renal clinic on 3/7  Cr at time was 3.58  Was 2.34 on 09/17/15     Outpatient Prescriptions Prior to Visit  Medication Sig Dispense Refill  . albuterol (PROVENTIL HFA;VENTOLIN HFA) 108 (90 BASE) MCG/ACT inhaler Inhale 2 puffs into the lungs every 4 (four) hours as needed for wheezing or shortness of breath. 1 Inhaler 6  . amitriptyline (ELAVIL) 50 MG tablet Take 1 tablet (50 mg total) by mouth at bedtime. 30 tablet 1  . amLODipine (NORVASC) 5 MG tablet Take 1 tablet (5 mg total) by mouth daily. 30 tablet 0  . aspirin 325 MG tablet Take 1 tablet (325 mg total) by mouth daily. 30 tablet 0  . atorvastatin (LIPITOR) 80 MG tablet Take 1 tablet (80 mg total) by mouth daily. 30 tablet 0  . carvedilol (COREG) 25 MG tablet Take 1 tablet (25 mg total) by mouth 2 (two) times daily with a meal. 28 tablet 0  . cloNIDine (CATAPRES) 0.3 MG tablet Take 1 tablet (0.3 mg total) by mouth 3 (three) times  daily. 90 tablet 0  . furosemide (LASIX) 80 MG tablet Take 2 tablets (160 mg total) by mouth 2 (two) times daily. 120 tablet 1  . gabapentin (NEURONTIN) 300 MG capsule Take 1 capsule (300 mg total) by mouth 3 (three) times daily. 200 mg in the morning and 100 mg in the evening. 90 capsule 0  . hydrALAZINE (APRESOLINE) 100 MG tablet Take 1 tablet (100 mg total) by mouth 3 (three) times daily. 90 tablet 1  . hydrOXYzine (ATARAX/VISTARIL) 25 MG tablet Take 1 tablet (25 mg total) by mouth 3 (three) times daily as needed for itching. 30 tablet 0  . insulin aspart (NOVOLOG) 100 UNIT/ML injection Inject into the skin. Inject into the skin as directed based on sliding scale daily    . insulin glargine (LANTUS) 100 UNIT/ML injection Inject into the skin 2 (two) times daily. Inject 45 units into the skin daily in the morning. Inject 35 units into the skin daily at night    . metolazone (ZAROXOLYN) 5 MG tablet Take 1 tablet (5 mg total) by mouth daily. 30 tablet 0  . nitroGLYCERIN (NITROSTAT) 0.4 MG SL tablet Place 1 tablet (0.4 mg total) under the tongue every 5 (five) minutes  as needed for chest pain. 25 tablet prn  . Polyvinyl Alcohol-Povidone (REFRESH OP) Place 2 drops into both eyes 3 (three) times daily as needed (dry eyes).    . Potassium Chloride ER 20 MEQ TBCR 20 mEq by Per post-pyloric tube route 2 (two) times daily. 60 tablet 1   No facility-administered medications prior to visit.     Allergies:   Review of patient's allergies indicates no known allergies.   Past Medical History  Diagnosis Date  . Hypertension   . Meralgia paraesthetica 05/03/2012  . Lumbar spondylosis 05/03/2012  . Lumbar spinal stenosis 05/03/2012    Mild with only right L4 nerve root encroachment, no neurogenic claudication   . Urinary incontinence 05/12/2012    Since the start of Sept. 2013   . Hemorrhoids 05/12/2012  . Meralgia paraesthetica 05/03/2012    On Lyrica which does improve pain.   Marland Kitchen Neuropathy in diabetes ([place])  05/12/2012  . Substance abuse   . Hyperlipidemia   . Pneumonia   . Diabetes mellitus with nephropathy ([place]) 05/12/2012  . Acute diastolic heart failure ([place])   . Retinopathy   . CHF (congestive heart failure) ([place])   . Obesity     Past Surgical History  Procedure Laterality Date  . Tonsillectomy    . Abdominal surgery      Abscess I&D 2/2 infected hair  . Colonoscopy w/ polypectomy      pt to bring records     Social History:  The patient  reports that he has been smoking Cigarettes.  He has a 12 pack-year smoking history. He has never used smokeless tobacco. He reports that he drinks about 3.6 oz of alcohol per week. He reports that he uses illicit drugs (Marijuana and Cocaine).   Family History:  The patient's family history includes Alcohol abuse in his father; Emphysema in his father; Prostate cancer in his father.    ROS:  Please see the history of present illness. All other systems are reviewed and  Negative to the above problem except as noted.    PHYSICAL EXAM: VS:  BP 179/98 mmHg  Pulse 89  Ht 5' 11.5" (1.816 m)  Wt 268 lb 6.4 oz (121.745 kg)  BMI 36.92 kg/m2  GEN: Well nourished, well developed, in no acute distress HEENT: normal Neck: no JVD, carotid bruits, or masses Cardiac: RRR; no murmurs, rubs, or gallops,no edema  Respiratory:  clear to auscultation bilaterally, normal work of breathing GI: soft, nontender, nondistended, + BS  No hepatomegaly  MS: no deformity Moving all extremities   Skin: warm and dry, no rash Neuro:  Strength and sensation are intact Psych: euthymic mood, full affect   EKG:  EKG is not ordered today.   Lipid Panel    Component Value Date/Time   CHOL 191 05/15/2015 0250   TRIG 239* 06/11/2015 0239   HDL 37* 05/15/2015 0250   CHOLHDL 5.2 05/15/2015 0250   VLDL 28 05/15/2015 0250   LDLCALC 126* 05/15/2015 0250   LDLDIRECT 99 09/16/2010 2130      Wt Readings from Last 3 Encounters:  11/08/15 268 lb 6.4 oz (121.745 kg)    10/28/15 292 lb 12.8 oz (132.813 kg)  10/22/15 293 lb 9.6 oz (133.176 kg)      ASSESSMENT AND PLAN:  1.  Chronic diastolic CHF  VOlume status is not bad  With check BMET and BNP    2.  HTN  Not well controlled  Conider increase amlodipine    Need to  see when appt is with Willy Eddy in renal clinic   3.  Renal  Function has declined  Follow in renal clinic     Current medicines are reviewed at length with the patient today.  The patient does not have concerns regarding medicines.  The following changes have been made:   Labs/ tests ordered today include: No orders of the defined types were placed in this encounter.     Disposition:   FU with  in   Signed, Dorris Carnes, MD  11/08/2015 4:21 PM    [facility] [place], [place], [place]  [address] Phone: [phone]; Fax: [phone]

## 2015-11-09 LAB — BRAIN NATRIURETIC PEPTIDE: Brain Natriuretic Peptide: 43.9 pg/mL (ref <100)

## 2015-11-09 LAB — SEDIMENTATION RATE: Sed Rate: 47 mm/hr — ABNORMAL HIGH (ref 0–15)

## 2015-11-12 ENCOUNTER — Telehealth: Payer: Self-pay | Admitting: Internal Medicine

## 2015-11-12 NOTE — Telephone Encounter (Signed)
New Message  Pt returning RN phone call. .Please call back and discuss.

## 2015-11-12 NOTE — Telephone Encounter (Signed)
Pt called as his complete needed notation from provider that he needs single level housing. notated in [place] last [place]; pt letter created and sent to [place]: Jerry Cantrell; [phone] fax. Pt told letter would be faxed today. Pt verbalized understanding, no additional questions at this time.

## 2015-11-18 ENCOUNTER — Telehealth: Payer: Self-pay | Admitting: Internal Medicine

## 2015-11-18 NOTE — Telephone Encounter (Signed)
F/u  Pt requested to speak w/ RN- Please call back and discuss.

## 2015-11-18 NOTE — Telephone Encounter (Signed)
Notes Recorded by Rodman Key, RN on 11/18/2015 at 10:42 AM Patient informed. Appointment made for f/u labs. Notes Recorded by Fay Records, MD on 11/12/2015 at 5:56 PM BNP is normal Renal function stable  ESR elevated.  WOuld forward to Willy Eddy   He has concerns about a rash he reports he has had for approx 3 months which has progressively gotten worse.  It is itchy and "chunks of skin are coming off".  It is itchy day and night with no relief.  He is asking for Dr. Harrington Challenger to refer him to dermatology for this.  I have advised that I will check with Dr. Harrington Challenger for recommendations for referral.  However, I strongly recommended that he contact his PCP as he may get in to see someone sooner.   Pt verbalizes understanding.

## 2015-11-19 ENCOUNTER — Ambulatory Visit: Payer: Medicaid Other | Attending: Ophthalmology | Admitting: Occupational Therapy

## 2015-11-19 ENCOUNTER — Telehealth: Payer: Self-pay | Admitting: Internal Medicine

## 2015-11-19 ENCOUNTER — Other Ambulatory Visit: Payer: Self-pay | Admitting: *Deleted

## 2015-11-19 DIAGNOSIS — H540X33 Blindness right eye category 3, blindness left eye category 3: Secondary | ICD-10-CM

## 2015-11-19 DIAGNOSIS — H540 Blindness, both eyes: Secondary | ICD-10-CM | POA: Insufficient documentation

## 2015-11-19 DIAGNOSIS — I110 Hypertensive heart disease with heart failure: Secondary | ICD-10-CM

## 2015-11-19 DIAGNOSIS — R21 Rash and other nonspecific skin eruption: Secondary | ICD-10-CM

## 2015-11-19 DIAGNOSIS — I5030 Unspecified diastolic (congestive) heart failure: Principal | ICD-10-CM

## 2015-11-19 NOTE — Therapy (Signed)
[facility] [address] [place], [place], [address] Phone: [phone]   Fax:  [phone]  Occupational Therapy Evaluation  Patient Details  Name: Jerry Cantrell MRN: [id number] Date of Birth: 07-19-1966 Referring Provider: Zadie Rhine  Encounter Date: 11/19/2015      OT End of Session - 11/19/15 1336    Visit Number 1   Number of Visits 1   Authorization Type Medicaid   Authorization Time Period Pt does not have a qualifying diagnosis   OT Start Time 1107   OT Stop Time 1150   OT Time Calculation (min) 43 min   Activity Tolerance Patient tolerated treatment well   Behavior During Therapy [facility] for tasks assessed/performed      Past Medical History  Diagnosis Date  . Hypertension   . Meralgia paraesthetica 05/03/2012  . Lumbar spondylosis 05/03/2012  . Lumbar spinal stenosis 05/03/2012    Mild with only right L4 nerve root encroachment, no neurogenic claudication   . Urinary incontinence 05/12/2012    Since the start of Sept. 2013   . Hemorrhoids 05/12/2012  . Meralgia paraesthetica 05/03/2012    On Lyrica which does improve pain.   Marland Kitchen Neuropathy in diabetes ([place]) 05/12/2012  . Substance abuse   . Hyperlipidemia   . Pneumonia   . Diabetes mellitus with nephropathy ([place]) 05/12/2012  . Acute diastolic heart failure ([place])   . Retinopathy   . CHF (congestive heart failure) ([place])   . Obesity     Past Surgical History  Procedure Laterality Date  . Tonsillectomy    . Abdominal surgery      Abscess I&D 2/2 infected hair  . Colonoscopy w/ polypectomy      pt to bring records    There were no vitals filed for this visit.  Visit Diagnosis:  Profound vision impairment both eyes      Subjective Assessment - 11/19/15 1728    Subjective  Pt with diabetic retinopathy presents with significant visual impairments which impede ADLs/IADLS.   Pertinent History see Epic   Patient Stated Goals to see better   Currently in Pain?  No/denies           [facility] OT Assessment - 11/19/15 0001    Assessment   Diagnosis Diabetic retinopathy   Referring Provider [place]   Onset Date 07/30/15   Assessment Pt presents with severe visual impairments as a result of diabetic retinopathy.   Precautions   Precautions Fall   Precaution Comments visual impairment   Balance Screen   Has the patient fallen in the past 6 months Yes   How many times? 2   Has the patient had a decrease in activity level because of a fear of falling?  Yes   Is the patient reluctant to leave their home because of a fear of falling?  No   Home  Environment   Family/patient expects to be discharged to: Private residence   Living Arrangements Alone   Type of [place] Two level   Alternate Level Stairs - Number of Steps flight   Lives With Alone   Prior Function   Level of Independence Independent with basic ADLs   Vocation On disability   ADL   Eating/Feeding Modified independent   Grooming --  difficulty shaving, would like electric razor   Upper Body Bathing Modified independent  bathtub and shower    Lower Body Bathing Modified independent   Upper Body  Dressing Increased time   Lower Body Dressing Modified independent   Toilet Tranfer Modified independent   Toileting - Clothing Manipulation Modified independent   Tub/Shower Transfer Modified independent   Tub/Shower Transfer Method Stand pivot   ADL comments Pt can benefit from a tub transfer bench for fall prevention.   IADL   Shopping Needs to be accompanied on any shopping trip   Meal Prep Able to complete simple warm meal prep   Mobility   Mobility Status Independent;History of falls  uses white cane   Vision - History   Visual History Other (comment)  Diabetic retinopathy   Patient Visual Report Peripheral vision impairment   Vision Assessment   Vision Assessment Vision tested   Right visual acuity tested 1 meter less than 20/1000-  unable   Left Visual Acuity tested 1 meter less than 20/1000 unable   Reading Acuity --  unable to perform- less than 20/400   Visual Fields --  left peripheral visual field, righcentral/peripheral deficit   Patient has diffculty with activities due to visual impairment --  Reading   Cognition   Overall Cognitive Status --  not tested                  OT Treatments/Exercises (OP) - 11/19/15 0001    ADLs   ADL Comments Discussion with pt regariding hi marks for stove. Pt reports he can adjust the stove without problems. Pt is already working with [facility]. Pt reports receiving a magnifier and talking BP cuff/ scale. Pt was instructed to continue to work with [facility] Therapist recommends a tub transfer bench for pt safety and a HHPT safety eval due to pt has had several falls. Therapist  to request from pt's PCP(Dr. Criss Rosales). Due to the severity of pt's visual impairments no additional OT recommended at this time in an outpatient setting.                           Plan - 11/19/15 1333    Clinical Impression Statement Pt presents to occupational therapy with severe visual impairments which impede performance of ADLs/IADLS. Pt can benefit from occupational therapy evaluation.   Rehab Potential Fair   OT Frequency One time visit   OT Duration 8 weeks   OT Treatment/Interventions Self-care/ADL training;Patient/family education;DME and/or AE instruction;Visual/perceptual remediation/compensation   Plan Due to the severity of pt's visual deficits no additional OT visits recommended at this time and therefore no goals set at this time. Pt can benefit from a tub transfer bench and PT home safety eval due to several recent falls. Pt has been encouraged to continue to follow up with [facility] as he is already receiving Services.   Consulted and Agree with Plan of Care Patient        Problem List Patient Active Problem List    Diagnosis Date Noted  . Obesity hypoventilation syndrome ([place])   . CKD (chronic kidney disease), stage III   . Type 2 diabetes, uncontrolled, with neuropathy ([place])   . Marijuana abuse   . Tobacco abuse   . Essential hypertension, malignant   . Chronic kidney disease, stage IV (severe) ([place])   . Legally blind 10/13/2015  . Hyperglycemia   . Accelerated hypertension   . Uncontrolled type 2 diabetes mellitus with ketoacidosis without coma ([place])   . Tobacco use disorder   . Encounter for imaging study to confirm orogastric (OG)  tube placement   . Cocaine abuse   . Hypertensive emergency 06/06/2015  . Altered mental status   . Hyperkalemia   . Chronic respiratory failure ([place])   . Pulmonary nodule 05/22/2015  . Elevated troponin   . Chest pain at rest 05/13/2015  . Acute on chronic renal insufficiency ([place]) 01/18/2015  . Thrombocytopenia ([place]) 12/04/2013  . Diastolic heart failure secondary to hypertension ([place]) 11/30/2013  . Radiculopathy with lower extremity symptoms 11/28/2013  . Dyspnea 09/16/2013  . Depression 06/16/2013  . Proliferative diabetic retinopathy ([place]) 06/08/2013  . Diabetic macular edema of left eye, with cataract, associated with type 2 diabetes mellitus ([place]) 06/08/2013  . Meibomianitis 06/08/2013  . Erectile dysfunction associated with type 2 diabetes mellitus ([place]) 04/22/2013  . Decreased peripheral vision of left eye 04/12/2013  . Plantar fasciitis of right foot 04/12/2013  . Cough 02/09/2013  . Obstructive sleep apnea 02/02/2013  . Diabetic peripheral neuropathy associated with type 2 diabetes mellitus ([place]) 12/27/2012  . Abnormality of gait 12/06/2012  . Family history of malignant neoplasm of gastrointestinal tract 10/10/2012  . Personal history of colonic polyps 10/10/2012  . Skin tag 08/18/2012  . Chronic low back pain 07/15/2012  . Preventative health care 06/02/2012  . Hyperlipemia 05/12/2012  . Hypertension 05/12/2012  . Urinary incontinence  05/12/2012  . Hemorrhoids 05/12/2012  . Blood per rectum 05/12/2012  . Meralgia paraesthetica 05/03/2012  . Lumbar spondylosis 05/03/2012  . Lumbar spinal stenosis 05/03/2012  . Insulin dependent type 2 diabetes mellitus, uncontrolled ([place]) 05/13/1999    Vaibhav Fogleman 11/19/2015, 5:34 PM Theone Murdoch, OTR/L [phone] Phone: [phone] 5:34 PM 11/19/2015 [facility] [address] [place], [place], [address] Phone: [phone]   Fax:  [phone]  Name: Melva Cisneros MRN: [id number] Date of Birth: 02-21-1966

## 2015-11-19 NOTE — Telephone Encounter (Signed)
New Message  [facility] calling to follow up on tele-monitoring information she sent our office in Feb. Please call back and discuss.

## 2015-11-20 ENCOUNTER — Encounter: Payer: Self-pay | Admitting: Pulmonary Disease

## 2015-11-26 ENCOUNTER — Telehealth: Payer: Self-pay | Admitting: Occupational Therapy

## 2015-11-26 ENCOUNTER — Ambulatory Visit: Payer: Self-pay | Admitting: Cardiology

## 2015-11-26 NOTE — Telephone Encounter (Signed)
Dr. Criss Rosales, Jerry Cantrell was seen for a low vision eval. He was referred by his opthamologist. Pt reports several recent falls. He may benefit from a referral to HHPT for a home safety eval and assessment for a tub bench to minimize fall risk due to low vision. If you agree, please make this referral.  Thanks, Theone Murdoch, OTR/L

## 2015-12-02 ENCOUNTER — Other Ambulatory Visit: Payer: Self-pay | Admitting: *Deleted

## 2015-12-02 DIAGNOSIS — Z01810 Encounter for preprocedural cardiovascular examination: Secondary | ICD-10-CM

## 2015-12-02 DIAGNOSIS — N185 Chronic kidney disease, stage 5: Secondary | ICD-10-CM

## 2015-12-03 ENCOUNTER — Encounter: Payer: Self-pay | Admitting: Vascular Surgery

## 2015-12-10 ENCOUNTER — Ambulatory Visit (HOSPITAL_COMMUNITY): Payer: Medicaid Other

## 2015-12-10 ENCOUNTER — Ambulatory Visit: Payer: Medicaid Other | Admitting: Vascular Surgery

## 2015-12-11 ENCOUNTER — Telehealth: Payer: Self-pay | Admitting: *Deleted

## 2015-12-11 NOTE — Telephone Encounter (Signed)
The pt appt for 12/24/15 has been rescheduled to 02/18/16 at 1015 am [facility].  Sharee Pimple has been notified as well as the pt.  He will keep the appt for his pre op appt

## 2015-12-11 NOTE — Telephone Encounter (Signed)
Patient called and he needs to reschedule the hospital procedure with Dr. Loletha Carrow.

## 2015-12-12 ENCOUNTER — Telehealth: Payer: Self-pay

## 2015-12-12 NOTE — Telephone Encounter (Signed)
Moviprep will be fine

## 2015-12-12 NOTE — Telephone Encounter (Signed)
We don't have instructions for [place].  Years ago we would instruct pt's to drink one glass every 15 minutes until all gone (which sometimes ended prematurely due to pt's tolerance) all the evening before procedure.  We would like to create an instruction packet based on your specifications.  Please advise.  Thank you, Angela/PV

## 2015-12-17 ENCOUNTER — Other Ambulatory Visit: Payer: Self-pay

## 2015-12-18 ENCOUNTER — Encounter: Payer: Self-pay | Admitting: Vascular Surgery

## 2015-12-19 ENCOUNTER — Telehealth: Payer: Self-pay | Admitting: Internal Medicine

## 2015-12-20 NOTE — Telephone Encounter (Signed)
Per Jerry Cantrell, patient is visually impaired and does not know the name of the medication that he needs. I do not see where Dr Harrington Challenger has refilled a blood pressure medication for him. Please advise. Thanks, MI

## 2015-12-20 NOTE — Telephone Encounter (Signed)
I called the patient. He was supposed to be seen at his PCP Dr. Fransico Setters office yesterday said but they would not see him due to he did not have a $3 copayment.  Because he wasn't seen, they wouldn't refill his meds. He asked me to speak with [facility]-that they may know which one needs filled. I advised Dr. Harrington Challenger may fill his BP meds until he can get to PCP if I can find out what he needs. Called [facility].  They closed at 6pm.   I left a detailed message and asked them to call back.

## 2015-12-20 NOTE — Telephone Encounter (Signed)
I have spoken to [facility] in the past. I have told her that Dr. Harrington Challenger does not do this sort of thing, but if pt's PCP would like to manage, she is ok with him having the telemonitoring.  I told her this a second time when she called back, but told her if she's like me to ask again I would, if she faxed the paperwork.  I have not received it.

## 2015-12-20 NOTE — Telephone Encounter (Signed)
New message    *STAT* If patient is at the pharmacy, call can be transferred to refill team.   1. Which medications need to be refilled? (please list name of each medication and dose if known) blood pressure medication - patient says vision impaired   2. Which pharmacy/location (including street and city if local pharmacy) is medication to be sent to? [place]    3. Do they need a 30 day or 90 day supply? 30 days

## 2015-12-23 NOTE — Telephone Encounter (Signed)
Will forward to Dr.Ross to see if she is willing to refill any of these medications. Pt was seeing Dr. Lavone Neri (care everywhere) but cancelled appointments with her and was to start seeing Dr. Criss Rosales.  (see note 4/21)

## 2015-12-23 NOTE — Telephone Encounter (Signed)
Pharmacy returned Michalenes phone call and stated that the patient needed refills on gabapentin, lantus and hydralazine. I made her aware that we do not normally refill gabapentin or lantus and per epic Dr Harrington Challenger does not refill the hydralazine either. Please advise. Thanks, MI

## 2015-12-24 ENCOUNTER — Ambulatory Visit (HOSPITAL_COMMUNITY): Payer: Medicaid Other

## 2015-12-24 ENCOUNTER — Encounter (HOSPITAL_COMMUNITY): Payer: Medicaid Other

## 2015-12-24 ENCOUNTER — Other Ambulatory Visit (HOSPITAL_COMMUNITY): Payer: Medicaid Other

## 2015-12-24 ENCOUNTER — Ambulatory Visit: Payer: Medicaid Other | Admitting: Vascular Surgery

## 2015-12-24 MED ORDER — HYDRALAZINE HCL 100 MG PO TABS: 100.0000 mg | ORAL_TABLET | Freq: Three times a day (TID) | ORAL | Status: DC

## 2015-12-24 NOTE — Telephone Encounter (Signed)
OK to refill meds as noted on my clinic note

## 2015-12-24 NOTE — Telephone Encounter (Signed)
Sent hydralyzine refills to [facility].

## 2015-12-26 ENCOUNTER — Telehealth: Payer: Self-pay | Admitting: Gastroenterology

## 2015-12-26 IMAGING — DX DG CHEST 2V
2 series · 2 of 2 positions shown · non-contrast
Comparison: Chest x-ray of June 18, 2015

CLINICAL DATA: Follow-up of pneumonia; patient reports persistent
shortness of breath. History of diabetes, CHF hypertension, and
current smoker.

EXAM:
CHEST  2 VIEW

[chest pa]
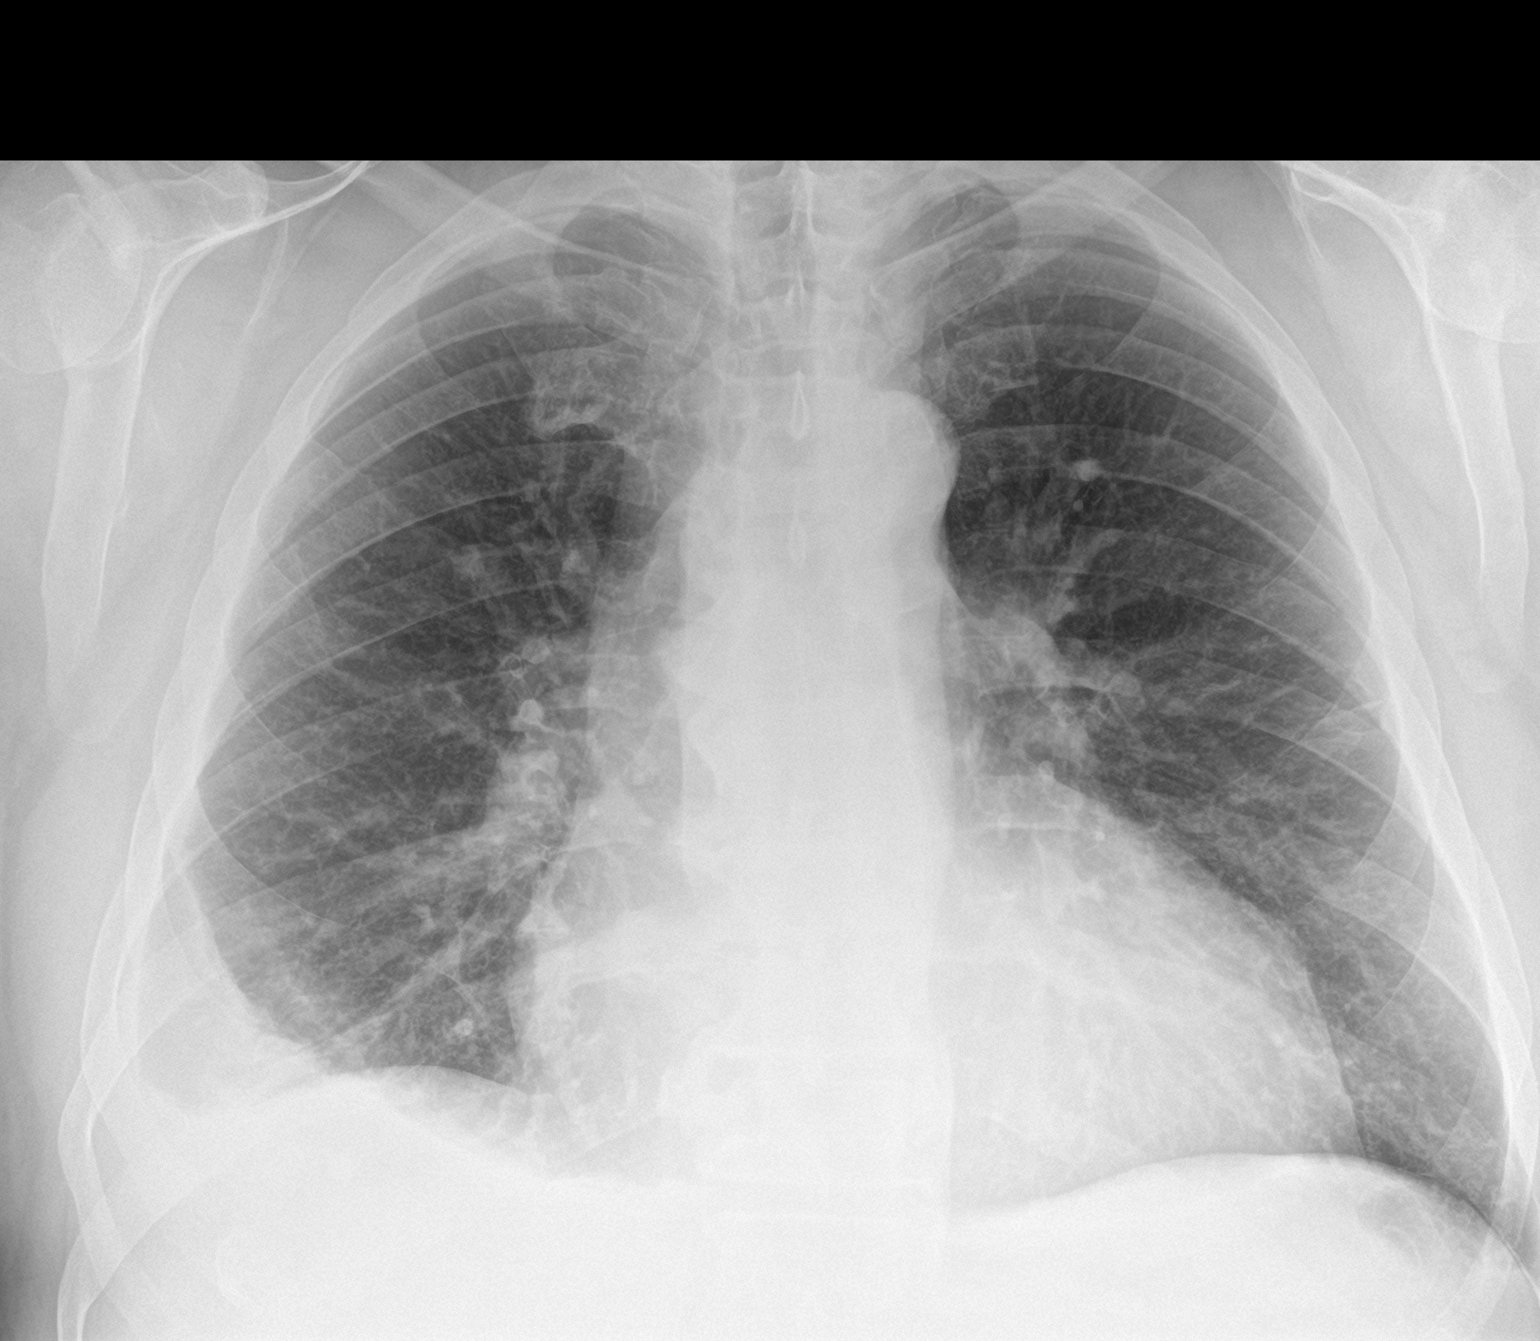

[chest lat]
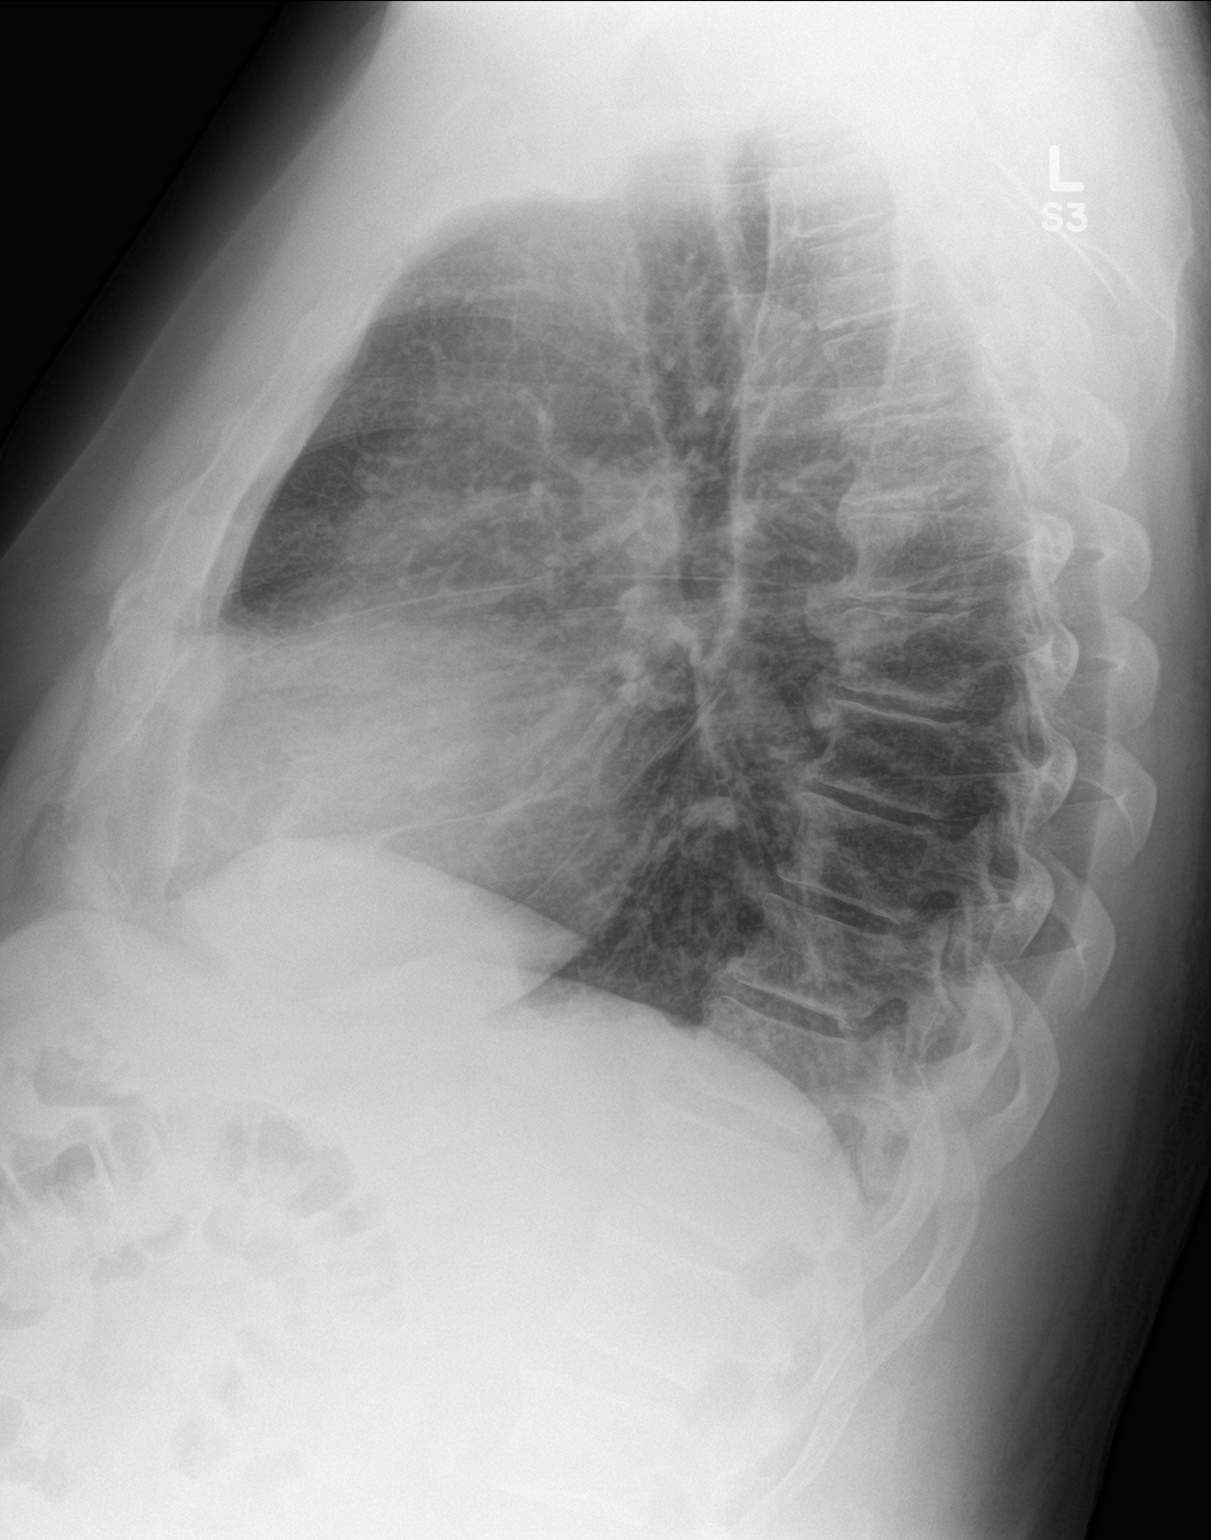

[2 of 2 positions shown; findings below may reference images not displayed]

FINDINGS: The left lung is well-expanded and clear. On the right there is a
persistent pleural effusion with area parenchymal density likely
laterally in the middle lobe. The cardiac silhouette remains
enlarged. The pulmonary vascularity is not engorged. The mediastinum
is normal in width. There is mild multilevel degenerative disc
disease of the thoracic spine.
IMPRESSION: Persistent abnormal density in the anterior lateral aspect of the
right middle lobe with small right pleural effusion. Stable
enlargement of the cardiac silhouette without pulmonary edema.

Given the patient's persistent symptoms, chest CT scanning is
recommended to further evaluate the right lung and pleural space.

## 2015-12-26 NOTE — Telephone Encounter (Signed)
Left message for pt to call back. Pt needs to see his PCP for this issue, it may be a Pilonidal cyst.

## 2016-01-01 NOTE — Telephone Encounter (Signed)
Unable to reach pt by phone will wait further communication

## 2016-01-02 ENCOUNTER — Encounter: Payer: Self-pay | Admitting: Surgery

## 2016-01-06 ENCOUNTER — Encounter: Payer: Self-pay | Admitting: Surgery

## 2016-01-06 ENCOUNTER — Ambulatory Visit (HOSPITAL_COMMUNITY)
Admission: RE | Admit: 2016-01-06 | Discharge: 2016-01-06 | Disposition: A | Discharge status: Home / Self Care | Payer: Medicaid Other | Source: Ambulatory Visit | Attending: Surgery | Admitting: Surgery

## 2016-01-06 ENCOUNTER — Ambulatory Visit (INDEPENDENT_AMBULATORY_CARE_PROVIDER_SITE_OTHER)
Admission: RE | Admit: 2016-01-06 | Discharge: 2016-01-06 | Disposition: A | Discharge status: Home / Self Care | Payer: Medicaid Other | Source: Ambulatory Visit | Attending: Surgery | Admitting: Surgery

## 2016-01-06 ENCOUNTER — Ambulatory Visit (INDEPENDENT_AMBULATORY_CARE_PROVIDER_SITE_OTHER): Payer: Medicaid Other | Admitting: Surgery

## 2016-01-06 VITALS — BP 146/89 | HR 79 | Temp 97.3°F | Resp 16 | Ht 71.0 in | Wt 286.9 lb

## 2016-01-06 DIAGNOSIS — E669 Obesity, unspecified: Secondary | ICD-10-CM | POA: Insufficient documentation

## 2016-01-06 DIAGNOSIS — E1140 Type 2 diabetes mellitus with diabetic neuropathy, unspecified: Secondary | ICD-10-CM | POA: Insufficient documentation

## 2016-01-06 DIAGNOSIS — Z01810 Encounter for preprocedural cardiovascular examination: Secondary | ICD-10-CM | POA: Insufficient documentation

## 2016-01-06 DIAGNOSIS — E1122 Type 2 diabetes mellitus with diabetic chronic kidney disease: Secondary | ICD-10-CM | POA: Diagnosis not present

## 2016-01-06 DIAGNOSIS — N185 Chronic kidney disease, stage 5: Secondary | ICD-10-CM

## 2016-01-06 DIAGNOSIS — Z6841 Body Mass Index (BMI) 40.0 and over, adult: Secondary | ICD-10-CM | POA: Diagnosis not present

## 2016-01-06 DIAGNOSIS — I5031 Acute diastolic (congestive) heart failure: Secondary | ICD-10-CM | POA: Diagnosis not present

## 2016-01-06 DIAGNOSIS — I132 Hypertensive heart and chronic kidney disease with heart failure and with stage 5 chronic kidney disease, or end stage renal disease: Secondary | ICD-10-CM | POA: Diagnosis not present

## 2016-01-06 DIAGNOSIS — E785 Hyperlipidemia, unspecified: Secondary | ICD-10-CM | POA: Diagnosis not present

## 2016-01-06 NOTE — Progress Notes (Signed)
[facility] [place]  Patient name: Jerry Cantrell MRN: [id number] DOB: 09-12-1965 Sex: male  REASON FOR VISIT: Permanent dialysis access  HPI: Jerry Cantrell is a 50 y.o. male, who presents for evaluation for permanent dialysis access. The patient is not yet on dialysis. He has CKD stage IV. The patient is right-handed. He has never had access procedures before. His decline in renal function is likely secondary to diabetes. He does not have a pacemaker. He has been complaining of itching all over.  He has a past medical history of congestive heart failure and hypertension managed on multiple antihypertensives. His diabetes is managed on insulin. His hyper lipidemia is managed on a statin. He takes daily aspirin. He is on home oxygen.  Past Medical History  Diagnosis Date  . Hypertension   . Meralgia paraesthetica 05/03/2012  . Lumbar spondylosis 05/03/2012  . Lumbar spinal stenosis 05/03/2012    Mild with only right L4 nerve root encroachment, no neurogenic claudication   . Urinary incontinence 05/12/2012    Since the start of Sept. 2013   . Hemorrhoids 05/12/2012  . Meralgia paraesthetica 05/03/2012    On Lyrica which does improve pain.   Marland Kitchen Neuropathy in diabetes ([place]) 05/12/2012  . Substance abuse   . Hyperlipidemia   . Pneumonia   . Diabetes mellitus with nephropathy ([place]) 05/12/2012  . Acute diastolic heart failure ([place])   . Retinopathy   . CHF (congestive heart failure) ([place])   . Obesity     Family History  Problem Relation Age of Onset  . Prostate cancer Father   . Alcohol abuse Father   . Emphysema Father     smoked    SOCIAL HISTORY: Social History   Social History  . Marital Status: Single    Spouse Name: N/A  . Number of Children: N/A  . Years of Education: N/A   Occupational History  . dietary services [employer]   Social History Main Topics  . Smoking status: Current Every Day Smoker -- 0.40 packs/day for 30 years    Types: Cigarettes  .  Smokeless tobacco: Never Used     Comment: "Trying - hard to stop"  . Alcohol Use: 3.6 oz/week    6 Cans of beer per week     Comment: beer  . Drug Use: No     Comment: marijuana "laced with something"; today, stated "no" to question of illegal drug use  . Sexual Activity: Not on file   Other Topics Concern  . Not on file   Social History Narrative    No Known Allergies  Current Outpatient Prescriptions  Medication Sig Dispense Refill  . albuterol (PROVENTIL HFA;VENTOLIN HFA) 108 (90 BASE) MCG/ACT inhaler Inhale 2 puffs into the lungs every 4 (four) hours as needed for wheezing or shortness of breath. 1 Inhaler 6  . amitriptyline (ELAVIL) 50 MG tablet Take 1 tablet (50 mg total) by mouth at bedtime. 30 tablet 1  . amLODipine (NORVASC) 5 MG tablet Take 1 tablet (5 mg total) by mouth daily. 30 tablet 0  . aspirin 325 MG tablet Take 1 tablet (325 mg total) by mouth daily. 30 tablet 0  . atorvastatin (LIPITOR) 80 MG tablet Take 1 tablet (80 mg total) by mouth daily. 30 tablet 0  . brimonidine (ALPHAGAN) 0.2 % ophthalmic solution Place 1 drop into the right eye 2 (two) times daily. Start 48 hours prior to surgery    . carboxymethylcellulose (REFRESH PLUS) 0.5 % SOLN Place  1 drop into both eyes 3 (three) times daily as needed.    . carvedilol (COREG) 25 MG tablet Take 1 tablet (25 mg total) by mouth 2 (two) times daily with a meal. 28 tablet 0  . cloNIDine (CATAPRES) 0.3 MG tablet Take 1 tablet (0.3 mg total) by mouth 3 (three) times daily. 90 tablet 0  . fluticasone (FLONASE) 50 MCG/ACT nasal spray Place 1 spray into both nostrils daily.    . furosemide (LASIX) 80 MG tablet Take 2 tablets (160 mg total) by mouth 2 (two) times daily. 120 tablet 1  . gabapentin (NEURONTIN) 300 MG capsule Take 1 capsule (300 mg total) by mouth 3 (three) times daily. 200 mg in the morning and 100 mg in the evening. (Patient taking differently: Take 300 mg by mouth 3 (three) times daily. ) 90 capsule 0  .  hydrALAZINE (APRESOLINE) 100 MG tablet Take 1 tablet (100 mg total) by mouth 3 (three) times daily. 90 tablet 3  . hydrOXYzine (ATARAX/VISTARIL) 25 MG tablet Take 1 tablet (25 mg total) by mouth 3 (three) times daily as needed for itching. 30 tablet 0  . insulin aspart (NOVOLOG) 100 UNIT/ML injection Inject 0-60 Units into the skin 3 (three) times daily with meals. Inject into the skin as directed based on sliding scale daily    . insulin glargine (LANTUS) 100 UNIT/ML injection Inject 35-45 Units into the skin 2 (two) times daily. Inject 45 units into the skin daily in the morning. Inject 35 units into the skin daily at night    . ketorolac (ACULAR) 0.5 % ophthalmic solution Place 1 drop into the right eye 4 (four) times daily. Start 1 week prior to surgery    . metolazone (ZAROXOLYN) 5 MG tablet Take 1 tablet (5 mg total) by mouth daily. (Patient taking differently: Take 5 mg by mouth daily. Take this with second dose of lasix in the afternoon) 30 tablet 0  . Multiple Vitamins-Minerals (CENTRUM SILVER ADULT 50+) TABS Take 1 tablet by mouth daily.    . nitroGLYCERIN (NITROSTAT) 0.4 MG SL tablet Place 1 tablet (0.4 mg total) under the tongue every 5 (five) minutes as needed for chest pain. 25 tablet prn  . ofloxacin (OCUFLOX) 0.3 % ophthalmic solution Place 1 drop into both eyes 4 (four) times daily. Start 72 prior to surgery    . Polyvinyl Alcohol-Povidone (REFRESH OP) Place 2 drops into both eyes 3 (three) times daily as needed (dry eyes).    . Potassium Chloride ER 20 MEQ TBCR 20 mEq by Per post-pyloric tube route 2 (two) times daily. (Patient taking differently: Take 20 mEq by mouth 2 (two) times daily. ) 60 tablet 1  . prednisoLONE acetate (PRED FORTE) 1 % ophthalmic suspension Place 1 drop into the right eye 4 (four) times daily. Start after surgery     No current facility-administered medications for this visit.    REVIEW OF SYSTEMS:  [X]  denotes positive finding, [ ]  denotes negative  finding Cardiac  Comments:  Chest pain or chest pressure:    Shortness of breath upon exertion: x   Short of breath when lying flat:    Irregular heart rhythm:        Vascular    Pain in calf, thigh, or hip brought on by ambulation:    Pain in feet at night that wakes you up from your sleep:     Blood clot in your veins:    Leg swelling:  x  Pulmonary    Oxygen at home: x   Productive cough:     Wheezing:         Neurologic    Sudden weakness in arms or legs:     Sudden numbness in arms or legs:     Sudden onset of difficulty speaking or slurred speech:    Temporary loss of vision in one eye:  x   Problems with dizziness:         Gastrointestinal    Blood in stool:     Vomited blood:         Genitourinary    Burning when urinating:  x   Blood in urine:        Psychiatric    Major depression:  x       Hematologic    Bleeding problems:    Problems with blood clotting too easily:        Skin    Rashes or ulcers: x Generalized itching      Constitutional    Fever or chills:      PHYSICAL EXAM: Filed Vitals:   01/06/16 1250 01/06/16 1256  BP: 164/95 146/89  Pulse: 79   Temp: 97.3 F (36.3 C)   TempSrc: Oral   Resp: 16   Height: 5\' 11"  (1.803 m)   Weight: 286 lb 14.4 oz (130.137 kg)     GENERAL: The patient is a well-nourished male, in no acute distress. The vital signs are documented above. CARDIAC: There is a regular rate and rhythm. No carotid bruits VASCULAR: 2+ radial and brachial pulses bilaterally. PULMONARY: There is good air exchange bilaterally without wheezing or rales. MUSCULOSKELETAL: There are no major deformities or cyanosis. NEUROLOGIC: No focal weakness or paresthesias are detected. SKIN: There are no ulcers or rashes noted. PSYCHIATRIC: The patient has a normal affect.  DATA:  Upper extremity vein mapping 01/06/2016  Adequate left and right cephalic and basilic vein conduits  Left: Cephalic vein at wrist is 0.28 cm. 0.36 cm at  antecubital fossa.  MEDICAL ISSUES: CKD stage IV  The patient is not yet on hemodialysis. He is right-handed. He has an adequate left cephalic vein for fistula creation. Discussed left radial cephalic versus brachial cephalic fistula creation. The patient is also interested in peritoneal dialysis, but he wants to pursue hemodialysis first. The procedure, risks, and benefits of access surgery was discussed with the patient and he is willing to proceed. Plan for left radial cephalic versus brachial cephalic fistula creation on 01/23/2016.   Virgina Jock, PA-C [facility] [place]  I agree with the above.  I have seen and evaluated the patient.  After reviewing his vein mapping, I feel he is a good candidate for a left arm fistula.  I discussed evaluating his left cephalic vein at the wrist to see if he is a candidate for a radiocephalic fistula.  If not I would proceed with a brachiocephalic fistula.  I discussed the risks and benefits of the procedure including the risk of non-maturity, the need for future interventions, fistula failure, and the risk of steal syndrome.  All of his questions were answered.  His operation has been scheduled for Thursday, May 25  Annamarie Major

## 2016-01-06 NOTE — Progress Notes (Signed)
Filed Vitals:   01/06/16 1250 01/06/16 1256  BP: 164/95 146/89  Pulse: 79   Temp: 97.3 F (36.3 C)   TempSrc: Oral   Resp: 16   Height: 5\' 11"  (1.803 m)   Weight: 286 lb 14.4 oz (130.137 kg)

## 2016-01-08 ENCOUNTER — Encounter: Payer: Self-pay | Admitting: Nephrology

## 2016-01-09 ENCOUNTER — Other Ambulatory Visit: Payer: Self-pay

## 2016-01-22 ENCOUNTER — Encounter (HOSPITAL_COMMUNITY): Payer: Self-pay | Admitting: Vascular Surgery

## 2016-01-22 NOTE — Progress Notes (Signed)
Pt denies SOB and chest pain but is under the care of Dr. Harrington Challenger, Cardiology. Pt stated that he was given pre-op Insulin instructions by the surgeons office ( take half of Lantus only at HS). See anesthesia note.

## 2016-01-22 NOTE — Progress Notes (Signed)
Anesthesia Chart Review: Patient is a 50 year old male scheduled for LUE AVF creation (radiocephalic vs brachiocephalic) on [id number] by Dr. Dessie Coma.  History includes smoking, CKD stage IV (not yet on hemodialysis), HTN, meralgia paraesthetica, substance abuse, HLD, diastolic CHF, tonsillectomy, DM2 on insulin, OSA (auto BiPap). Last BMI is consistent with morbid obesity.  - 12/2012 admission for sepsis with CAP and acute on chronic renal failure. Cardiology consulted due to a nocturnal 3.5 second pause with hypoxemia (O2 sat 80's) followed by transient 2nd degree Type I with WCT. Arrhythmia felt related to apneic spell. CPAP compliance recommended. - 10/2013 admission for hypertensive emergency likely due to medication non-compliance. - 12/2014 admission for CAP with acute respiratory failure, acute renal failure and encephalopathy. - 05/2015 admission for CHF exacerbation and chest pain. Readmitted 06/06/15 for acute respiratory failure due to CHF exacerbation and cocaine use. He left AMA 06/14/15.  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 - 10/2015 admission for acute on chronic diastolic CHF, malignant hypertension. UDS positive for cocaine 10/13/15. EF 60-65% (improved for 35% on 07/2015 stress test).  PCP is listed as Dr. Lucianne Lei. Nephrologist is Dr. Justin Mend. Has [facility] evaluation scheduled [facility]. Cardiologist is Dr. Dorris Carnes, last visit 11/08/15.  Pulmonologist is Dr. Kara Mead, last visit 10/28/15.  Meds include albuterol, amitriptyline, amlodipine, ASA, Lipitor, Coreg, clonidine, Flonase, Lasix, Neurontin, hydralazine, hydroxyzine, Novolog, Lantus, metolazone, Nitro, KCl.  10/13/15 EKG: NSR, anteroseptal infarct (age undetermined), T wave abnormality, consider laterral ischemia, prolonged QT (QT 424/QTc 582ms).   10/14/15 Echo: Study Conclusions - Left ventricle: The cavity size was normal. There was severe  concentric  hypertrophy. Systolic function was normal. The  estimated ejection fraction was in the range of 60% to 65%. Wall  motion was normal; there were no regional wall motion  abnormalities. There was an increased relative contribution of  atrial contraction to ventricular filling. Doppler parameters are  consistent with abnormal left ventricular relaxation (grade 1  diastolic dysfunction). - Right ventricle: The cavity size was mildly dilated. Wall  thickness was normal. (Previous echocardiograms showed EF 55% 05/15/15, 55-60% 01/18/15.)   07/31/15 Nuclear stress test:   The left ventricular ejection fraction is moderately decreased (30-44%).  Nuclear stress EF: 38%.  There was no ST segment deviation noted during stress.  This is a low risk study. There is no ischemia and no evidence of previous MI .  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  11/01/15 Chest CT w/o contrast: IMPRESSION: - Slight interval decrease in size of moderate right pleural effusion with underlying atelectasis in the right lower lobe. - Scattered bilateral pulmonary nodules, the majority of which are stable dating back to 2011. The 5 mm nodule within the left lower lobe is stable dating back to 05/15/2015. Recommend additional follow-up chest CT in 12 months to demonstrate stability.  10/28/15 CXR: IMPRESSION: Resolution of infiltrates and effusions. Stable cardiac enlargement and chronic bronchitic changes.  10/28/15 PFTs: FVC 2.88 (54%), FEV1 2.38 (57%), DLCOunc 20.30 (59%).  He is for labs on arrival. He does have a history of cocaine use (+ UDS 10/2015). Will defer decision for UDS to his anesthesiologist following their evaluation tomorrow.   George Hugh [facility] Stay Center/Anesthesiology Phone [phone] 01/22/2016 11:16 AM

## 2016-01-23 ENCOUNTER — Ambulatory Visit (HOSPITAL_COMMUNITY): Admission: RE | Admit: 2016-01-23 | Payer: Medicaid Other | Source: Ambulatory Visit | Admitting: Surgery

## 2016-01-23 HISTORY — DX: Sleep apnea, unspecified: G47.30

## 2016-01-23 SURGERY — ARTERIOVENOUS (AV) FISTULA CREATION
Anesthesia: Monitor Anesthesia Care | Laterality: Left

## 2016-01-24 ENCOUNTER — Other Ambulatory Visit: Payer: Self-pay

## 2016-01-28 ENCOUNTER — Ambulatory Visit: Payer: Self-pay | Admitting: Adult Health

## 2016-02-04 ENCOUNTER — Telehealth: Payer: Self-pay

## 2016-02-04 ENCOUNTER — Ambulatory Visit: Payer: Self-pay | Admitting: Adult Health

## 2016-02-04 NOTE — Telephone Encounter (Signed)
Pt did not show for his previsit today and has an appt for colon on 02/18/16 at [facility]  I have placed a call to the pt Left message on machine to call back

## 2016-02-05 ENCOUNTER — Encounter (HOSPITAL_COMMUNITY): Payer: Self-pay | Admitting: *Deleted

## 2016-02-05 NOTE — Telephone Encounter (Signed)
Left message on machine to call back  

## 2016-02-05 NOTE — Progress Notes (Signed)
Pt denies any recent chest pain or sob. Has seen Dr. Dorris Carnes in the past for chest pain after using cocaine. Pt denies any recent use of cocaine.  Pt is diabetic, states his fasting blood sugar has been around 140-165. He states he has run out of testing strips. Pt was instructed by Dr. Luther Parody office to use 1/2 of his usual dose of Lantus insulin tonight.

## 2016-02-05 NOTE — Progress Notes (Signed)
Pt called with time change. Pt is a SDW and surgery has been moved to 0830, 02/06/16. Pt asked to arrive at 0600 with understanding verbalized.

## 2016-02-06 ENCOUNTER — Ambulatory Visit (HOSPITAL_COMMUNITY): Payer: Medicaid Other | Admitting: Certified Registered Nurse Anesthetist

## 2016-02-06 ENCOUNTER — Telehealth: Payer: Self-pay | Admitting: Vascular Surgery

## 2016-02-06 ENCOUNTER — Encounter (HOSPITAL_COMMUNITY)
Admission: RE | Disposition: A | Discharge status: Home / Self Care | Payer: Self-pay | Source: Ambulatory Visit | Attending: Vascular Surgery

## 2016-02-06 ENCOUNTER — Encounter (HOSPITAL_COMMUNITY): Payer: Self-pay | Admitting: *Deleted

## 2016-02-06 ENCOUNTER — Ambulatory Visit (HOSPITAL_COMMUNITY)
Admission: RE | Admit: 2016-02-06 | Discharge: 2016-02-06 | Disposition: A | Discharge status: Home / Self Care | Payer: Medicaid Other | Source: Ambulatory Visit | Attending: Vascular Surgery | Admitting: Vascular Surgery

## 2016-02-06 DIAGNOSIS — Z6841 Body Mass Index (BMI) 40.0 and over, adult: Secondary | ICD-10-CM | POA: Diagnosis not present

## 2016-02-06 DIAGNOSIS — I5032 Chronic diastolic (congestive) heart failure: Secondary | ICD-10-CM | POA: Diagnosis not present

## 2016-02-06 DIAGNOSIS — Z794 Long term (current) use of insulin: Secondary | ICD-10-CM | POA: Diagnosis not present

## 2016-02-06 DIAGNOSIS — E785 Hyperlipidemia, unspecified: Secondary | ICD-10-CM | POA: Diagnosis not present

## 2016-02-06 DIAGNOSIS — E11319 Type 2 diabetes mellitus with unspecified diabetic retinopathy without macular edema: Secondary | ICD-10-CM | POA: Diagnosis not present

## 2016-02-06 DIAGNOSIS — Z79899 Other long term (current) drug therapy: Secondary | ICD-10-CM | POA: Insufficient documentation

## 2016-02-06 DIAGNOSIS — F17210 Nicotine dependence, cigarettes, uncomplicated: Secondary | ICD-10-CM | POA: Insufficient documentation

## 2016-02-06 DIAGNOSIS — E669 Obesity, unspecified: Secondary | ICD-10-CM | POA: Insufficient documentation

## 2016-02-06 DIAGNOSIS — I130 Hypertensive heart and chronic kidney disease with heart failure and stage 1 through stage 4 chronic kidney disease, or unspecified chronic kidney disease: Secondary | ICD-10-CM | POA: Diagnosis not present

## 2016-02-06 DIAGNOSIS — E1122 Type 2 diabetes mellitus with diabetic chronic kidney disease: Secondary | ICD-10-CM | POA: Diagnosis not present

## 2016-02-06 DIAGNOSIS — N184 Chronic kidney disease, stage 4 (severe): Secondary | ICD-10-CM | POA: Insufficient documentation

## 2016-02-06 DIAGNOSIS — Z7982 Long term (current) use of aspirin: Secondary | ICD-10-CM | POA: Insufficient documentation

## 2016-02-06 DIAGNOSIS — G4730 Sleep apnea, unspecified: Secondary | ICD-10-CM | POA: Diagnosis not present

## 2016-02-06 DIAGNOSIS — Z7951 Long term (current) use of inhaled steroids: Secondary | ICD-10-CM | POA: Diagnosis not present

## 2016-02-06 DIAGNOSIS — E1140 Type 2 diabetes mellitus with diabetic neuropathy, unspecified: Secondary | ICD-10-CM | POA: Diagnosis not present

## 2016-02-06 HISTORY — PX: AV FISTULA PLACEMENT: SHX1204

## 2016-02-06 LAB — POCT I-STAT 4, (NA,K, GLUC, HGB,HCT)
GLUCOSE: 216 mg/dL — AB (ref 65–99)
HCT: 39 % (ref 39.0–52.0)
Hemoglobin: 13.3 g/dL (ref 13.0–17.0)
Potassium: 3.5 mmol/L (ref 3.5–5.1)
Sodium: 139 mmol/L (ref 135–145)

## 2016-02-06 LAB — GLUCOSE, CAPILLARY
GLUCOSE-CAPILLARY: 222 mg/dL — AB (ref 65–99)
Glucose-Capillary: 224 mg/dL — ABNORMAL HIGH (ref 65–99)

## 2016-02-06 SURGERY — ARTERIOVENOUS (AV) FISTULA CREATION
Anesthesia: Monitor Anesthesia Care | Site: Arm Lower | Laterality: Left

## 2016-02-06 MED ORDER — LIDOCAINE HCL (CARDIAC) 20 MG/ML IV SOLN
INTRAVENOUS | Status: DC | PRN
  Administered 2016-02-06: 80 mg via INTRAVENOUS

## 2016-02-06 MED ORDER — PROPOFOL 10 MG/ML IV BOLUS
INTRAVENOUS | Status: DC | PRN
  Administered 2016-02-06: 130 mg via INTRAVENOUS

## 2016-02-06 MED ORDER — SUCCINYLCHOLINE CHLORIDE 200 MG/10ML IV SOSY
PREFILLED_SYRINGE | INTRAVENOUS | Status: AC
  Filled 2016-02-06: qty 10

## 2016-02-06 MED ORDER — PROMETHAZINE HCL 25 MG/ML IJ SOLN: 6.2500 mg | INTRAMUSCULAR | Status: DC | PRN

## 2016-02-06 MED ORDER — MIDAZOLAM HCL 2 MG/2ML IJ SOLN
INTRAMUSCULAR | Status: AC
  Filled 2016-02-06: qty 2

## 2016-02-06 MED ORDER — SODIUM CHLORIDE 0.9 % IV SOLN
INTRAVENOUS | Status: DC
  Administered 2016-02-06 (×2): via INTRAVENOUS

## 2016-02-06 MED ORDER — PHENYLEPHRINE HCL 10 MG/ML IJ SOLN
10.0000 mg | INTRAVENOUS | Status: DC | PRN
  Administered 2016-02-06: 50 ug/min via INTRAVENOUS

## 2016-02-06 MED ORDER — ONDANSETRON HCL 4 MG/2ML IJ SOLN
INTRAMUSCULAR | Status: AC
  Filled 2016-02-06: qty 2

## 2016-02-06 MED ORDER — MIDAZOLAM HCL 5 MG/5ML IJ SOLN
INTRAMUSCULAR | Status: DC | PRN
  Administered 2016-02-06: 2 mg via INTRAVENOUS

## 2016-02-06 MED ORDER — PHENYLEPHRINE 40 MCG/ML (10ML) SYRINGE FOR IV PUSH (FOR BLOOD PRESSURE SUPPORT)
PREFILLED_SYRINGE | INTRAVENOUS | Status: AC
  Filled 2016-02-06: qty 10

## 2016-02-06 MED ORDER — CEFUROXIME SODIUM 1.5 G IJ SOLR
1.5000 g | INTRAMUSCULAR | Status: AC
  Administered 2016-02-06: 1.5 g via INTRAVENOUS

## 2016-02-06 MED ORDER — FENTANYL CITRATE (PF) 250 MCG/5ML IJ SOLN
INTRAMUSCULAR | Status: AC
  Filled 2016-02-06: qty 5

## 2016-02-06 MED ORDER — ROCURONIUM BROMIDE 50 MG/5ML IV SOLN
INTRAVENOUS | Status: AC
  Filled 2016-02-06: qty 1

## 2016-02-06 MED ORDER — CHLORHEXIDINE GLUCONATE CLOTH 2 % EX PADS: 6.0000 | MEDICATED_PAD | Freq: Once | CUTANEOUS | Status: DC

## 2016-02-06 MED ORDER — OXYCODONE-ACETAMINOPHEN 5-325 MG PO TABS
1.0000 | ORAL_TABLET | Freq: Once | ORAL | Status: AC
  Administered 2016-02-06: 1 via ORAL

## 2016-02-06 MED ORDER — LIDOCAINE-EPINEPHRINE 0.5 %-1:200000 IJ SOLN
INTRAMUSCULAR | Status: AC
  Filled 2016-02-06: qty 1

## 2016-02-06 MED ORDER — ONDANSETRON HCL 4 MG/2ML IJ SOLN
INTRAMUSCULAR | Status: DC | PRN
  Administered 2016-02-06: 4 mg via INTRAVENOUS

## 2016-02-06 MED ORDER — PROPOFOL 500 MG/50ML IV EMUL
INTRAVENOUS | Status: DC | PRN
  Administered 2016-02-06: 200 ug/kg/min via INTRAVENOUS

## 2016-02-06 MED ORDER — OXYCODONE-ACETAMINOPHEN 5-325 MG PO TABS: 1.0000 | ORAL_TABLET | Freq: Four times a day (QID) | ORAL | Status: DC | PRN

## 2016-02-06 MED ORDER — SODIUM CHLORIDE 0.9 % IV SOLN
INTRAVENOUS | Status: DC | PRN
  Administered 2016-02-06: 500 mL

## 2016-02-06 MED ORDER — OXYCODONE-ACETAMINOPHEN 5-325 MG PO TABS
ORAL_TABLET | ORAL | Status: DC
Start: 2016-02-06 — End: 2016-02-06
  Filled 2016-02-06: qty 1

## 2016-02-06 MED ORDER — EPHEDRINE 5 MG/ML INJ
INTRAVENOUS | Status: AC
  Filled 2016-02-06: qty 10

## 2016-02-06 MED ORDER — PHENYLEPHRINE HCL 10 MG/ML IJ SOLN
INTRAMUSCULAR | Status: DC | PRN
  Administered 2016-02-06 (×2): 120 ug via INTRAVENOUS

## 2016-02-06 MED ORDER — 0.9 % SODIUM CHLORIDE (POUR BTL) OPTIME
TOPICAL | Status: DC | PRN
  Administered 2016-02-06: 1000 mL

## 2016-02-06 MED ORDER — PROPOFOL 10 MG/ML IV BOLUS
INTRAVENOUS | Status: AC
  Filled 2016-02-06: qty 60

## 2016-02-06 MED ORDER — HYDROMORPHONE HCL 1 MG/ML IJ SOLN: 0.2500 mg | INTRAMUSCULAR | Status: DC | PRN

## 2016-02-06 MED ORDER — EPHEDRINE SULFATE 50 MG/ML IJ SOLN
INTRAMUSCULAR | Status: DC | PRN
  Administered 2016-02-06: 20 mg via INTRAVENOUS
  Administered 2016-02-06 (×2): 15 mg via INTRAVENOUS

## 2016-02-06 MED ORDER — LIDOCAINE-EPINEPHRINE 0.5 %-1:200000 IJ SOLN
INTRAMUSCULAR | Status: DC | PRN
  Administered 2016-02-06: 30 mL

## 2016-02-06 MED ORDER — DEXTROSE 5 % IV SOLN
INTRAVENOUS | Status: AC
  Filled 2016-02-06: qty 1.5

## 2016-02-06 SURGICAL SUPPLY — 39 items
APL SKNCLS STERI-STRIP NONHPOA (GAUZE/BANDAGES/DRESSINGS) ×1
ARMBAND PINK RESTRICT EXTREMIT (MISCELLANEOUS) ×2 IMPLANT
BENZOIN TINCTURE PRP APPL 2/3 (GAUZE/BANDAGES/DRESSINGS) ×2 IMPLANT
CANISTER SUCTION 2500CC (MISCELLANEOUS) ×2 IMPLANT
CANNULA VESSEL 3MM 2 BLNT TIP (CANNULA) ×1 IMPLANT
CLIP LIGATING EXTRA MED SLVR (CLIP) ×2 IMPLANT
CLIP LIGATING EXTRA SM BLUE (MISCELLANEOUS) ×2 IMPLANT
CLOSURE STERI-STRIP 1/4X4 (GAUZE/BANDAGES/DRESSINGS) ×1 IMPLANT
COVER PROBE W GEL 5X96 (DRAPES) ×2 IMPLANT
DECANTER SPIKE VIAL GLASS SM (MISCELLANEOUS) ×2 IMPLANT
ELECT REM PT RETURN 9FT ADLT (ELECTROSURGICAL) ×2
ELECTRODE REM PT RTRN 9FT ADLT (ELECTROSURGICAL) ×1 IMPLANT
GAUZE SPONGE 4X4 12PLY STRL (GAUZE/BANDAGES/DRESSINGS) ×1 IMPLANT
GEL ULTRASOUND 20GR AQUASONIC (MISCELLANEOUS) IMPLANT
GLOVE BIOGEL PI IND STRL 6.5 (GLOVE) IMPLANT
GLOVE BIOGEL PI IND STRL 7.0 (GLOVE) IMPLANT
GLOVE BIOGEL PI IND STRL 7.5 (GLOVE) IMPLANT
GLOVE BIOGEL PI INDICATOR 6.5 (GLOVE) ×3
GLOVE BIOGEL PI INDICATOR 7.0 (GLOVE) ×2
GLOVE BIOGEL PI INDICATOR 7.5 (GLOVE) ×1
GLOVE ECLIPSE 7.0 STRL STRAW (GLOVE) ×1 IMPLANT
GLOVE SS BIOGEL STRL SZ 7.5 (GLOVE) ×1 IMPLANT
GLOVE SUPERSENSE BIOGEL SZ 7.5 (GLOVE) ×1
GLOVE SURG SS PI 6.0 STRL IVOR (GLOVE) ×1 IMPLANT
GOWN STRL REUS W/ TWL LRG LVL3 (GOWN DISPOSABLE) ×3 IMPLANT
GOWN STRL REUS W/TWL LRG LVL3 (GOWN DISPOSABLE) ×6
KIT BASIN OR (CUSTOM PROCEDURE TRAY) ×2 IMPLANT
KIT ROOM TURNOVER OR (KITS) ×2 IMPLANT
NS IRRIG 1000ML POUR BTL (IV SOLUTION) ×2 IMPLANT
PACK CV ACCESS (CUSTOM PROCEDURE TRAY) ×2 IMPLANT
PAD ARMBOARD 7.5X6 YLW CONV (MISCELLANEOUS) ×4 IMPLANT
SPONGE GAUZE 4X4 12PLY STER LF (GAUZE/BANDAGES/DRESSINGS) ×1 IMPLANT
STRIP CLOSURE SKIN 1/2X4 (GAUZE/BANDAGES/DRESSINGS) ×2 IMPLANT
SUT PROLENE 6 0 CC (SUTURE) ×2 IMPLANT
SUT VIC AB 3-0 SH 27 (SUTURE) ×2
SUT VIC AB 3-0 SH 27X BRD (SUTURE) ×1 IMPLANT
TAPE CLOTH SURG 4X10 WHT LF (GAUZE/BANDAGES/DRESSINGS) ×1 IMPLANT
UNDERPAD 30X30 INCONTINENT (UNDERPADS AND DIAPERS) ×2 IMPLANT
WATER STERILE IRR 1000ML POUR (IV SOLUTION) ×2 IMPLANT

## 2016-02-06 NOTE — Anesthesia Preprocedure Evaluation (Signed)
Anesthesia Evaluation  Patient identified by MRN, date of birth, ID band Patient awake    Reviewed: Allergy & Precautions, NPO status   Airway Mallampati: II  TM Distance: >3 FB Neck ROM: Full    Dental   Pulmonary shortness of breath, sleep apnea , pneumonia, Current Smoker,    breath sounds clear to auscultation       Cardiovascular hypertension, +CHF   Rhythm:Regular Rate:Normal     Neuro/Psych    GI/Hepatic negative GI ROS, Neg liver ROS,   Endo/Other  diabetes  Renal/GU Renal disease     Musculoskeletal   Abdominal   Peds  Hematology   Anesthesia Other Findings   Reproductive/Obstetrics                             Anesthesia Physical Anesthesia Plan  ASA: IV  Anesthesia Plan: MAC   Post-op Pain Management:    Induction: Intravenous  Airway Management Planned: Simple Face Mask  Additional Equipment:   Intra-op Plan:   Post-operative Plan:   Informed Consent: I have reviewed the patients History and Physical, chart, labs and discussed the procedure including the risks, benefits and alternatives for the proposed anesthesia with the patient or authorized representative who has indicated his/her understanding and acceptance.   Dental advisory given  Plan Discussed with: CRNA and Anesthesiologist  Anesthesia Plan Comments:         Anesthesia Quick Evaluation

## 2016-02-06 NOTE — Telephone Encounter (Signed)
Sched appt 7/11 at 3. Lm on hm# to inform pt.

## 2016-02-06 NOTE — Telephone Encounter (Signed)
-----   Message from Mena Goes, RN sent at 02/06/2016 10:39 AM EDT ----- Regarding: schedule   ----- Message -----    From: Ulyses Amor, PA-C    Sent: 02/06/2016   9:42 AM      To: Vvs Charge Pool  F/U with Dr. Renda Rolls in 4 weeks no study needed.  S/P left forearm av fistula creation

## 2016-02-06 NOTE — Op Note (Signed)
    OPERATIVE REPORT  DATE OF SURGERY: 02/06/2016  PATIENT: Jerry Cantrell, 50 y.o. male MRN: [id number]  DOB: 24-Apr-1966  PRE-OPERATIVE DIAGNOSIS: Chronic renal insufficiency stage IV  POST-OPERATIVE DIAGNOSIS:  Same  PROCEDURE: Left radiocephalic AV fistula  SURGEON:  Curt Jews, M.D.  PHYSICIAN ASSISTANT: Collins  ANESTHESIA:  LMA with local  EBL: Minimal ml  Total I/O In: 500 [I.V.:500] Out: 10 [Blood:10]  BLOOD ADMINISTERED: None  DRAINS: None  SPECIMEN: None  COUNTS CORRECT:  YES  PLAN OF CARE: PACU   PATIENT DISPOSITION:  PACU - hemodynamically stable  PROCEDURE DETAILS: The patient was taken to the operating placed supine position where there is a left arm straight drainage sterile fashion. An incision was made between the level of the cephalic vein and the radial artery at the wrist. The cephalic vein was mobilized and was of excellent caliber. The trigger branches were ligated with 301 4-0 silk ties and divided. The vein was ligated distally and divided. The vein was gently dilated with heparinized saline and was of excellent caliber for fistula. The radial artery was exposed the same incision. The radial artery was also of good size with no evidence of atherosclerotic change. The artery was occluded proximally and distally with Serafin clamps and was opened with an 11 blade some on file with Potts scissors. The vein was spatulated and was sewn end-to-side to the artery with a running 6-0 Prolene suture. The usual flushing maneuvers were undertaken prior to completion of the anastomosis. Anastomosis was completed and flow was restored to the fistula. Good thrill was noted. The wounds were irrigated with saline. Hemostasis tablet cautery. Wounds were closed with 3-0 Vicryl in the subcutaneous and subcuticular tissue. Benzoin and Steri-Strips were applied   Curt Jews, M.D. 02/06/2016 10:24 AM

## 2016-02-06 NOTE — Transfer of Care (Signed)
Immediate Anesthesia Transfer of Care Note  Patient: Jerry Cantrell  Procedure(s) Performed: Procedure(s): LEFT ARM RADIOCEPHALIC ARTERIOVENOUS (AV) FISTULA CREATION (Left)  Patient Location: PACU  Anesthesia Type:General  Level of Consciousness: awake, alert  and patient cooperative  Airway & Oxygen Therapy: Patient Spontanous Breathing and Patient connected to face mask oxygen  Post-op Assessment: Report given to RN and Post -op Vital signs reviewed and stable  Post vital signs: Reviewed and stable  Last Vitals:  Filed Vitals:   02/06/16 0638  BP: 170/82  Pulse: 76  Temp: 36.9 C  Resp: 20    Last Pain: There were no vitals filed for this visit.    Patients Stated Pain Goal: 0 ([id number])  Complications: No apparent anesthesia complications

## 2016-02-06 NOTE — H&P (Signed)
Jerry Cantrell  01/06/2016 1:00 PM  Office Visit  MRN:  [id number]   Description: Male DOB: [birth year]  Provider: Serafina Mitchell, MD  Department: [facility]       Diagnoses     Chronic kidney disease, stage 5 ([place]) - Primary    ICD-9-CM: 585.5 ICD-10-CM: N18.5       Reason for Visit     New Evaluation    CKD Stage 5; referred by Dr. Justin Mend for perm. access eval.    Reason for Visit History        Current Vitals  Most recent update: 01/06/2016 12:56 PM by Joline Salt Pullins, RN    BP Pulse Temp(Src) Resp Ht Wt    146/89 mmHg 79 97.3 F (36.3 C) (Oral) 16 5\' 11"  (1.803 m) 286 lb 14.4 oz (130.137 kg)    BMI            40.03 kg/m2        Vitals History     BMI Data     Body Mass Index Body Surface Area    40.03 kg/m 2 2.55 m 2      Progress Notes      Jerry George, RN at 01/06/2016 12:56 PM     Status: Signed       Expand All Collapse All   Filed Vitals:   01/06/16 1250 01/06/16 1256  BP: 164/95 146/89  Pulse: 79   Temp: 97.3 F (36.3 C)   TempSrc: Oral   Resp: 16   Height: 5\' 11"  (1.803 m)   Weight: 286 lb 14.4 oz (130.137 kg)               Serafina Mitchell, MD at 01/06/2016 2:13 PM     Status: Signed       Expand All Collapse All     [facility]  Patient name: Jerry MartinMRN: [id number] DOB: [birth year]Sex: male  REASON FOR VISIT: Permanent dialysis access  HPI: Jerry Cantrell is a 50 y.o. male, who presents for evaluation for permanent dialysis access. The patient is not yet on dialysis. He has CKD stage IV. The patient is right-handed. He has never had access procedures before. His decline in renal function is likely secondary to diabetes. He does not have a pacemaker. He has been complaining of itching all over.  He has a past medical history of congestive heart failure and hypertension managed on multiple antihypertensives. His  diabetes is managed on insulin. His hyper lipidemia is managed on a statin. He takes daily aspirin. He is on home oxygen.  Past Medical History  Diagnosis Date  . Hypertension   . Meralgia paraesthetica 05/03/2012  . Lumbar spondylosis 05/03/2012  . Lumbar spinal stenosis 05/03/2012    Mild with only right L4 nerve root encroachment, no neurogenic claudication   . Urinary incontinence 05/12/2012    Since the start of Sept. 2013   . Hemorrhoids 05/12/2012  . Meralgia paraesthetica 05/03/2012    On Lyrica which does improve pain.   Marland Kitchen Neuropathy in diabetes ([place]) 05/12/2012  . Substance abuse   . Hyperlipidemia   . Pneumonia   . Diabetes mellitus with nephropathy ([place]) 05/12/2012  . Acute diastolic heart failure ([place])   . Retinopathy   . CHF (congestive heart failure) ([place])   . Obesity     Family History  Problem Relation Age of Onset  . Prostate cancer Father   . Alcohol abuse Father   . Emphysema Father  smoked    SOCIAL HISTORY: Social History   Social History  . Marital Status: Single    Spouse Name: N/A  . Number of Children: N/A  . Years of Education: N/A   Occupational History  . dietary services [employer]   Social History Main Topics  . Smoking status: Current Every Day Smoker -- 0.40 packs/day for 30 years    Types: Cigarettes  . Smokeless tobacco: Never Used     Comment: "Trying - hard to stop"  . Alcohol Use: 3.6 oz/week    6 Cans of beer per week     Comment: beer  . Drug Use: No     Comment: marijuana "laced with something"; today, stated "no" to question of illegal drug use  . Sexual Activity: Not on file   Other Topics Concern  . Not on file   Social History Narrative    No Known Allergies  Current Outpatient Prescriptions  Medication Sig Dispense Refill  . albuterol (PROVENTIL  HFA;VENTOLIN HFA) 108 (90 BASE) MCG/ACT inhaler Inhale 2 puffs into the lungs every 4 (four) hours as needed for wheezing or shortness of breath. 1 Inhaler 6  . amitriptyline (ELAVIL) 50 MG tablet Take 1 tablet (50 mg total) by mouth at bedtime. 30 tablet 1  . amLODipine (NORVASC) 5 MG tablet Take 1 tablet (5 mg total) by mouth daily. 30 tablet 0  . aspirin 325 MG tablet Take 1 tablet (325 mg total) by mouth daily. 30 tablet 0  . atorvastatin (LIPITOR) 80 MG tablet Take 1 tablet (80 mg total) by mouth daily. 30 tablet 0  . brimonidine (ALPHAGAN) 0.2 % ophthalmic solution Place 1 drop into the right eye 2 (two) times daily. Start 48 hours prior to surgery    . carboxymethylcellulose (REFRESH PLUS) 0.5 % SOLN Place 1 drop into both eyes 3 (three) times daily as needed.    . carvedilol (COREG) 25 MG tablet Take 1 tablet (25 mg total) by mouth 2 (two) times daily with a meal. 28 tablet 0  . cloNIDine (CATAPRES) 0.3 MG tablet Take 1 tablet (0.3 mg total) by mouth 3 (three) times daily. 90 tablet 0  . fluticasone (FLONASE) 50 MCG/ACT nasal spray Place 1 spray into both nostrils daily.    . furosemide (LASIX) 80 MG tablet Take 2 tablets (160 mg total) by mouth 2 (two) times daily. 120 tablet 1  . gabapentin (NEURONTIN) 300 MG capsule Take 1 capsule (300 mg total) by mouth 3 (three) times daily. 200 mg in the morning and 100 mg in the evening. (Patient taking differently: Take 300 mg by mouth 3 (three) times daily. ) 90 capsule 0  . hydrALAZINE (APRESOLINE) 100 MG tablet Take 1 tablet (100 mg total) by mouth 3 (three) times daily. 90 tablet 3  . hydrOXYzine (ATARAX/VISTARIL) 25 MG tablet Take 1 tablet (25 mg total) by mouth 3 (three) times daily as needed for itching. 30 tablet 0  . insulin aspart (NOVOLOG) 100 UNIT/ML injection Inject 0-60 Units into the skin 3 (three) times daily with meals. Inject into the skin as directed based on  sliding scale daily    . insulin glargine (LANTUS) 100 UNIT/ML injection Inject 35-45 Units into the skin 2 (two) times daily. Inject 45 units into the skin daily in the morning. Inject 35 units into the skin daily at night    . ketorolac (ACULAR) 0.5 % ophthalmic solution Place 1 drop into the right eye 4 (four) times daily. Start 1 week  prior to surgery    . metolazone (ZAROXOLYN) 5 MG tablet Take 1 tablet (5 mg total) by mouth daily. (Patient taking differently: Take 5 mg by mouth daily. Take this with second dose of lasix in the afternoon) 30 tablet 0  . Multiple Vitamins-Minerals (CENTRUM SILVER ADULT 50+) TABS Take 1 tablet by mouth daily.    . nitroGLYCERIN (NITROSTAT) 0.4 MG SL tablet Place 1 tablet (0.4 mg total) under the tongue every 5 (five) minutes as needed for chest pain. 25 tablet prn  . ofloxacin (OCUFLOX) 0.3 % ophthalmic solution Place 1 drop into both eyes 4 (four) times daily. Start 72 prior to surgery    . Polyvinyl Alcohol-Povidone (REFRESH OP) Place 2 drops into both eyes 3 (three) times daily as needed (dry eyes).    . Potassium Chloride ER 20 MEQ TBCR 20 mEq by Per post-pyloric tube route 2 (two) times daily. (Patient taking differently: Take 20 mEq by mouth 2 (two) times daily. ) 60 tablet 1  . prednisoLONE acetate (PRED FORTE) 1 % ophthalmic suspension Place 1 drop into the right eye 4 (four) times daily. Start after surgery     No current facility-administered medications for this visit.    REVIEW OF SYSTEMS:  [X]  denotes positive finding, [ ]  denotes negative finding Cardiac  Comments:  Chest pain or chest pressure:    Shortness of breath upon exertion: x   Short of breath when lying flat:    Irregular heart rhythm:        Vascular    Pain in calf, thigh, or hip brought on by ambulation:    Pain in feet at night that wakes you up from your sleep:     Blood clot in your veins:      Leg swelling:  x       Pulmonary    Oxygen at home: x   Productive cough:     Wheezing:         Neurologic    Sudden weakness in arms or legs:     Sudden numbness in arms or legs:     Sudden onset of difficulty speaking or slurred speech:    Temporary loss of vision in one eye:  x   Problems with dizziness:         Gastrointestinal    Blood in stool:     Vomited blood:         Genitourinary    Burning when urinating:  x   Blood in urine:        Psychiatric    Major depression:  x       Hematologic    Bleeding problems:    Problems with blood clotting too easily:        Skin    Rashes or ulcers: x Generalized itching      Constitutional    Fever or chills:      PHYSICAL EXAM: Filed Vitals:   01/06/16 1250 01/06/16 1256  BP: 164/95 146/89  Pulse: 79   Temp: 97.3 F (36.3 C)   TempSrc: Oral   Resp: 16   Height: 5\' 11"  (1.803 m)   Weight: 286 lb 14.4 oz (130.137 kg)     GENERAL: The patient is a well-nourished male, in no acute distress. The vital signs are documented above. CARDIAC: There is a regular rate and rhythm. No carotid bruits VASCULAR: 2+ radial and brachial pulses bilaterally. PULMONARY: There is good air exchange bilaterally without wheezing or rales. MUSCULOSKELETAL:  There are no major deformities or cyanosis. NEUROLOGIC: No focal weakness or paresthesias are detected. SKIN: There are no ulcers or rashes noted. PSYCHIATRIC: The patient has a normal affect.  DATA:  Upper extremity vein mapping 01/06/2016  Adequate left and right cephalic and basilic vein conduits  Left: Cephalic vein at wrist is 0.28 cm. 0.36 cm at antecubital fossa.  MEDICAL ISSUES: CKD stage IV  The patient is not yet on hemodialysis. He is right-handed. He has an adequate left cephalic vein for fistula creation. Discussed left radial cephalic  versus brachial cephalic fistula creation. The patient is also interested in peritoneal dialysis, but he wants to pursue hemodialysis first. The procedure, risks, and benefits of access surgery was discussed with the patient and he is willing to proceed. Plan for left radial cephalic versus brachial cephalic fistula creation on 01/23/2016.   Virgina Jock, PA-C [facility]  I agree with the above. I have seen and evaluated the patient. After reviewing his vein mapping, I feel he is a good candidate for a left arm fistula. I discussed evaluating his left cephalic vein at the wrist to see if he is a candidate for a radiocephalic fistula. If not I would proceed with a brachiocephalic fistula. I discussed the risks and benefits of the procedure including the risk of non-maturity, the need for future interventions, fistula failure, and the risk of steal syndrome. All of his questions were answered. His operation has been scheduled for Thursday, May 25  Annamarie Major            Revision History       Date/Time User Action    > 01/06/2016 2:53 PM Serafina Mitchell, MD Sign     01/06/2016 2:19 PM Alvia Grove, PA-C Sign at close encounter               Psych Notes     No notes of this type exist for this encounter.     [facility] Patient Outreach     No notes of this type exist for this encounter.     Not recorded        Referring Provider     Lucianne Lei, MD     Level of Service     PR OFFICE OUTPATIENT NEW 60 MINUTES [99205]         All Flowsheet Templates (all recorded)     Amb Nursing Assessment    Anthropometrics    Custom Formula Data    Encounter Vitals    Extended [place] Directives    Infectious Disease Screening    MEWS Score      Referring Provider     Lucianne Lei, MD     All Charges for This Encounter     Code Description Service Date Service  Provider Modifiers Qty    [phone] PR OFFICE OUTPATIENT NEW 60 MINUTES 01/06/2016 Serafina Mitchell, MD  1      Communications Sent     Recipient Method Sent by Date Sent    Lucianne Lei, MD Fax Serafina Mitchell, MD 01/06/2016    Fax: [phone] Phone: [phone]       AVS Reports     Date/Time Report Action User    01/06/2016 5:20 PM After Visit Summary Printed Berniece Salines      Smoking Cessation Audit Trail       Ready to Quit Counseling Given User Date and Time  Office Visit - 10/28/2015 No Yes Rigoberto Noel, MD 10/29/2015 8:20 AM    Office Visit - 07/16/2015 Yes Yes Melvenia Needles, NP 07/16/2015 11:18 AM    Office Visit - 05/22/2015 No Yes Rigoberto Noel, MD 05/22/2015 12:21 PM    Office Visit - 10/25/2013 Yes Yes Tanda Rockers, MD 10/25/2013 9:31 AM    Office Visit - 09/26/2013 No Yes Camillo Flaming Ditzler, RN 09/26/2013 9:26 AM    Office Visit - 09/15/2013 Yes Yes Tanda Rockers, MD 09/15/2013 9:50 AM    Office Visit - 08/09/2013 No Yes [place], RN 08/09/2013 10:46 AM    Office Visit - 06/16/2013 No Yes [place], RN 06/16/2013 11:51 AM    Office Visit - 04/12/2013 Not Answered Yes Ebbie Latus, RN 04/12/2013 10:08 AM    Office Visit - 02/09/2013 Yes Yes Lela W Sturdivant 02/09/2013 9:47 AM     No Yes Lela W Sturdivant 02/09/2013 9:30 AM    Office Visit - 02/02/2013 No Yes [place], RN 02/02/2013 3:32 PM    Office Visit - 10/10/2012 Yes Yes Annabell Sabal, [place] 10/10/2012 8:53 AM    Office Visit - 08/18/2012 No Yes Camillo Flaming Ditzler, RN 08/18/2012 2:37 PM    Office Visit - 05/12/2012 No Yes Camillo Flaming Ditzler, RN 05/12/2012 8:33 AM      Diabetic Foot Exam    No data filed     Diabetic Foot Form - Detailed    No data filed     Diabetic Foot Exam - Simple    No data filed     Guarantor Account: [place], Lassiter ([id number])     Relation to Patient: Account Type Service Area    Self Personal/Family [place] for This Account     Coverage ID Payor Plan Insurance ID    [id number] MEDICAID [place] [id number]        Guarantor Account: Storme, Lien ([id number])     Relation to Patient: Account Type Service Area    Self Personal/Family [facility]          Guarantor Account: Donnavan, Bacino ([id number])     Relation to Patient: Account Type Service Area    Self Personal/Family [facility]          Guarantor Account: Phelps, Eiche ([id number])     Relation to Patient: Account Type [place] AREA          Guarantor Account: Anothy, Purifoy ([id number])     Relation to Patient: Account Type Service Area    Self Personal/Family [place]          Guarantor Account: Rachad, Cannella ([id number])     Relation to Patient: Account Type Service Area    Self Personal/Family [place]          Guarantor Account: Rayhaan, Antonopoulos ([id number])     Relation to Patient: Account Type Service Area    Self Personal/Family [place]          History     Reviewed By Date/Time Sections Reviewed    Alvia Grove, PA-C 01/06/2016 2:13 PM Tobacco    Pullins, Lynetta Mare, RN 01/06/2016 12:53 PM Tobacco      Medical Necessity Transport Form     Medical Necessity  Addendum:  The patient has been re-examined and re-evaluated.  The patient's history and physical has been reviewed and is unchanged.    Shawan Faison is a 50 y.o. male is being admitted with Stage IV Chronic Kidney Disease N18.4. All the risks, benefits and other treatment options have been discussed with the patient. The patient has consented to proceed with Procedure(s): RADIOCEPHALIC VERSUS BRACHIOCEPHALIC ARTERIOVENOUS (AV) FISTULA CREATION as a surgical  intervention.  Curt Jews 02/06/2016 8:24 AM [facility]

## 2016-02-06 NOTE — Anesthesia Procedure Notes (Signed)
Procedure Name: LMA Insertion Date/Time: 02/06/2016 8:53 AM Performed by: Salli Quarry Tonisha Silvey Pre-anesthesia Checklist: Patient identified, Emergency Drugs available, Suction available and Patient being monitored Patient Re-evaluated:Patient Re-evaluated prior to inductionOxygen Delivery Method: Circle System Utilized Preoxygenation: Pre-oxygenation with 100% oxygen Intubation Type: IV induction Ventilation: Oral airway inserted - appropriate to patient size and Two handed mask ventilation required LMA: LMA inserted LMA Size: 5.0 Number of attempts: 1 Airway Equipment and Method: Bite block Placement Confirmation: positive ETCO2 Tube secured with: Tape Dental Injury: Teeth and Oropharynx as per pre-operative assessment

## 2016-02-06 NOTE — Anesthesia Postprocedure Evaluation (Signed)
Anesthesia Post Note  Patient: Jerry Cantrell  Procedure(s) Performed: Procedure(s) (LRB): LEFT ARM RADIOCEPHALIC ARTERIOVENOUS (AV) FISTULA CREATION (Left)  Patient location during evaluation: PACU Anesthesia Type: General Level of consciousness: awake Pain management: pain level controlled Vital Signs Assessment: post-procedure vital signs reviewed and stable Respiratory status: spontaneous breathing Cardiovascular status: stable Anesthetic complications: no    Last Vitals:  Filed Vitals:   02/06/16 1045 02/06/16 1100  BP: 116/92 144/85  Pulse: 81 80  Temp: 36.7 C   Resp: 15 20    Last Pain:  Filed Vitals:   02/06/16 1103  PainSc: 2                  EDWARDS,Rosalia Mcavoy

## 2016-02-07 ENCOUNTER — Encounter (HOSPITAL_COMMUNITY): Payer: Self-pay | Admitting: Vascular Surgery

## 2016-02-10 ENCOUNTER — Encounter (HOSPITAL_COMMUNITY): Payer: Self-pay | Admitting: *Deleted

## 2016-02-10 NOTE — Telephone Encounter (Signed)
Left message on machine to call back letter mailed to have pt reach out to our office.

## 2016-02-17 ENCOUNTER — Encounter (HOSPITAL_COMMUNITY): Payer: Self-pay | Admitting: Certified Registered Nurse Anesthetist

## 2016-02-18 ENCOUNTER — Ambulatory Visit (HOSPITAL_COMMUNITY): Admission: RE | Admit: 2016-02-18 | Payer: Medicaid Other | Source: Ambulatory Visit | Admitting: Gastroenterology

## 2016-02-18 SURGERY — COLONOSCOPY WITH PROPOFOL
Anesthesia: Monitor Anesthesia Care

## 2016-02-18 MED ORDER — ONDANSETRON HCL 4 MG/2ML IJ SOLN
INTRAMUSCULAR | Status: AC
  Filled 2016-02-18: qty 2

## 2016-02-18 MED ORDER — LIDOCAINE HCL (CARDIAC) 20 MG/ML IV SOLN
INTRAVENOUS | Status: AC
  Filled 2016-02-18: qty 5

## 2016-02-18 MED ORDER — PROPOFOL 10 MG/ML IV BOLUS
INTRAVENOUS | Status: AC
  Filled 2016-02-18: qty 60

## 2016-02-28 ENCOUNTER — Encounter: Payer: Self-pay | Admitting: Vascular Surgery

## 2016-03-10 ENCOUNTER — Encounter: Payer: Medicaid Other | Admitting: Vascular Surgery

## 2016-03-23 ENCOUNTER — Telehealth: Payer: Self-pay | Admitting: Internal Medicine

## 2016-03-23 ENCOUNTER — Telehealth: Payer: Self-pay | Admitting: Gastroenterology

## 2016-03-23 DIAGNOSIS — E785 Hyperlipidemia, unspecified: Secondary | ICD-10-CM

## 2016-03-23 DIAGNOSIS — I5030 Unspecified diastolic (congestive) heart failure: Secondary | ICD-10-CM

## 2016-03-23 DIAGNOSIS — I10 Essential (primary) hypertension: Secondary | ICD-10-CM

## 2016-03-23 DIAGNOSIS — I110 Hypertensive heart disease with heart failure: Secondary | ICD-10-CM

## 2016-03-23 NOTE — Telephone Encounter (Signed)
The pt needs to reschedule colon for family history of colon cancer at [facility] with Dr Loletha Carrow.  He no showed last pre visit and colon.

## 2016-03-23 NOTE — Telephone Encounter (Signed)
Patient had blood work done in March with recommendation for f/u lab work to be done 4 weeks later.  Patient apologizes for not getting this done yet -- states he has "been going through a mid-life crisis". Informed patient that since blood work is several months overdue we will send to Dr. Harrington Challenger to advise on what blood work she would like drawn (in case she wants different blood work). Patient understands we will call him back once she has advised.

## 2016-03-23 NOTE — Telephone Encounter (Signed)
New message     Patient calling would to be set up for lab work .     Lab work in the system is date for March  2017.

## 2016-03-23 NOTE — Telephone Encounter (Signed)
Would check CBC, BMET, BNP and lipid

## 2016-03-25 NOTE — Telephone Encounter (Signed)
Unable to reach pt letter mailed 

## 2016-03-25 NOTE — Telephone Encounter (Signed)
Lab orders placed.  Called patient and appointment scheduled for fasting labs on 04/06/16.   Offered to make 6 mo f/u appointment with Dr. Harrington Challenger and he asked to wait because he is upstairs lying down and he will wait for someone to call him back after lab work to schedule follow up.

## 2016-03-27 ENCOUNTER — Encounter: Payer: Self-pay | Admitting: Vascular Surgery

## 2016-04-02 ENCOUNTER — Encounter: Payer: Medicaid Other | Admitting: Vascular Surgery

## 2016-04-06 ENCOUNTER — Other Ambulatory Visit: Payer: Self-pay

## 2016-04-07 ENCOUNTER — Ambulatory Visit: Payer: Self-pay | Admitting: Adult Health

## 2016-04-08 ENCOUNTER — Telehealth: Payer: Self-pay | Admitting: Gastroenterology

## 2016-04-09 NOTE — Telephone Encounter (Signed)
The pt needs to reschedule colon for family history of colon cancer at [facility] with Dr Loletha Carrow.  He no showed last pre visit and colon.

## 2016-04-09 NOTE — Telephone Encounter (Signed)
Left message on machine to call back  

## 2016-04-10 NOTE — Telephone Encounter (Signed)
Left message on machine to call back  

## 2016-04-14 NOTE — Telephone Encounter (Signed)
Left message on machine to call back I will wait for the pt to return call, I have had no response from any messages left for the pt.

## 2016-04-16 ENCOUNTER — Other Ambulatory Visit: Payer: Self-pay

## 2016-04-16 DIAGNOSIS — Z800 Family history of malignant neoplasm of digestive organs: Secondary | ICD-10-CM

## 2016-04-20 ENCOUNTER — Encounter: Payer: Self-pay | Admitting: Adult Health

## 2016-04-21 ENCOUNTER — Ambulatory Visit (INDEPENDENT_AMBULATORY_CARE_PROVIDER_SITE_OTHER): Payer: Medicaid Other | Admitting: Adult Health

## 2016-04-21 ENCOUNTER — Ambulatory Visit (INDEPENDENT_AMBULATORY_CARE_PROVIDER_SITE_OTHER)
Admission: RE | Admit: 2016-04-21 | Discharge: 2016-04-21 | Disposition: A | Discharge status: Home / Self Care | Payer: Medicaid Other | Source: Ambulatory Visit | Attending: Adult Health | Admitting: Adult Health

## 2016-04-21 ENCOUNTER — Encounter: Payer: Self-pay | Admitting: Adult Health

## 2016-04-21 VITALS — HR 87 | Ht 73.0 in | Wt 280.0 lb

## 2016-04-21 DIAGNOSIS — G4733 Obstructive sleep apnea (adult) (pediatric): Secondary | ICD-10-CM

## 2016-04-21 DIAGNOSIS — J449 Chronic obstructive pulmonary disease, unspecified: Secondary | ICD-10-CM | POA: Diagnosis not present

## 2016-04-21 DIAGNOSIS — R059 Cough, unspecified: Secondary | ICD-10-CM

## 2016-04-21 DIAGNOSIS — R05 Cough: Secondary | ICD-10-CM | POA: Diagnosis not present

## 2016-04-21 DIAGNOSIS — R911 Solitary pulmonary nodule: Secondary | ICD-10-CM | POA: Diagnosis not present

## 2016-04-21 NOTE — Progress Notes (Signed)
Subjective:    Patient ID: Jerry Cantrell, male    DOB: 07/06/1966, 50 y.o.   MRN: [id number]  HPI 50 year old male smoker with polysubstance abuse with severe sleep apnea on nocturnal BiPAP, chronic hypoxic respiratory failure on O2 He has chronic diastolic heart failure and CKD    Significant tests/ events CT chest 05/2015 -evidence of old granulomatous disease with calcified right hilar lymph nodes, bilateral subcentimeter nodules , some new compared to 02/2010 PSG 01/2015- severe OSA, AHI 99/hour, not corrected by CPAP required BiPAP 25/21 with a full face mask Download 10/2015 >> AHI 11/h, leak ++, on auo bipap, good usage  04/21/2016 Follow up :  : OSA , D-CHF, pulmonary nodule  Pt returns for a 6 month follow up .  He remains on BIPAP w/ improved control with less daytime sleepiness.  Download shows okay compliance with avg usage at 8hr . Encouraged on use.  AHI 14.2 . Remains on IPAP /EPAP 20/5 w/ PS 5cmH20 Says overall doing okay, gets winded easily . Wears out of energy and breath at times.  No flare of cough or wheezing. Does have occasional cough with thick mucus.   Has CKD , had AV fistula placed 01/2016.  On lasix 160mg  Twice daily    Denies fever, chest pain , orthopnea or hemoptysis.  Still smoking, discussed cessation. Would like to try nicotine patches.   Following pulmonary nodules on CT chest . Have been stable ,  Last CT chest 10/2015 showed stable nodules since 05/2015 for21mm LLL nodule , rest stable since 2011.  Discussed plans for repeat CT chest in 10/2016 .     Past Medical History:  Diagnosis Date  . Acute diastolic heart failure ([place])   . CHF (congestive heart failure) ([place])   . Diabetes mellitus with nephropathy ([place]) 05/12/2012   Dr. Justin Mend follows  . Hemorrhoids 05/12/2012  . Hyperlipidemia   . Hypertension   . Lumbar spinal stenosis 05/03/2012   Mild with only right L4 nerve root encroachment, no neurogenic claudication   . Lumbar spondylosis 05/03/2012    . Meralgia paraesthetica 05/03/2012  . Meralgia paraesthetica 05/03/2012   On Lyrica which does improve pain.   Marland Kitchen Neuropathy in diabetes ([place]) 05/12/2012  . Obesity   . Pneumonia   . Retinopathy   . Sleep apnea    uses cpap  . Substance abuse   . Urinary incontinence 05/12/2012   Since the start of Sept. 2013    Current Outpatient Prescriptions on File Prior to Visit  Medication Sig Dispense Refill  . albuterol (PROVENTIL HFA;VENTOLIN HFA) 108 (90 BASE) MCG/ACT inhaler Inhale 2 puffs into the lungs every 4 (four) hours as needed for wheezing or shortness of breath. 1 Inhaler 6  . amitriptyline (ELAVIL) 50 MG tablet Take 1 tablet (50 mg total) by mouth at bedtime. 30 tablet 1  . amLODipine (NORVASC) 5 MG tablet Take 1 tablet (5 mg total) by mouth daily. 30 tablet 0  . aspirin 325 MG tablet Take 1 tablet (325 mg total) by mouth daily. 30 tablet 0  . atorvastatin (LIPITOR) 80 MG tablet Take 1 tablet (80 mg total) by mouth daily. 30 tablet 0  . carvedilol (COREG) 25 MG tablet Take 1 tablet (25 mg total) by mouth 2 (two) times daily with a meal. 28 tablet 0  . cloNIDine (CATAPRES) 0.3 MG tablet Take 1 tablet (0.3 mg total) by mouth 3 (three) times daily. 90 tablet 0  . fluticasone (FLONASE) 50 MCG/ACT nasal spray  Place 1 spray into both nostrils daily.    . furosemide (LASIX) 80 MG tablet Take 2 tablets (160 mg total) by mouth 2 (two) times daily. 120 tablet 1  . gabapentin (NEURONTIN) 300 MG capsule Take 1 capsule (300 mg total) by mouth 3 (three) times daily. 200 mg in the morning and 100 mg in the evening. (Patient taking differently: Take 300 mg by mouth 3 (three) times daily. ) 90 capsule 0  . hydrALAZINE (APRESOLINE) 100 MG tablet Take 1 tablet (100 mg total) by mouth 3 (three) times daily. 90 tablet 3  . hydrOXYzine (ATARAX/VISTARIL) 25 MG tablet Take 1 tablet (25 mg total) by mouth 3 (three) times daily as needed for itching. 30 tablet 0  . insulin aspart (NOVOLOG) 100 UNIT/ML injection  Inject 0-60 Units into the skin 3 (three) times daily with meals. Inject into the skin as directed based on sliding scale daily    . insulin glargine (LANTUS) 100 UNIT/ML injection Inject 35-45 Units into the skin 2 (two) times daily. Inject 45 units into the skin daily in the morning. Inject 35 units into the skin daily at night    . metolazone (ZAROXOLYN) 5 MG tablet Take 1 tablet (5 mg total) by mouth daily. (Patient taking differently: Take 5 mg by mouth daily. Take this with second dose of lasix in the afternoon) 30 tablet 0  . Multiple Vitamins-Minerals (CENTRUM SILVER ADULT 50+) TABS Take 1 tablet by mouth daily.    . nitroGLYCERIN (NITROSTAT) 0.4 MG SL tablet Place 1 tablet (0.4 mg total) under the tongue every 5 (five) minutes as needed for chest pain. 25 tablet prn  . oxyCODONE-acetaminophen (PERCOCET/ROXICET) 5-325 MG tablet Take 1 tablet by mouth every 6 (six) hours as needed. 6 tablet 0  . Polyvinyl Alcohol-Povidone (REFRESH OP) Place 2 drops into both eyes 3 (three) times daily as needed (dry eyes).    . Potassium Chloride ER 20 MEQ TBCR 20 mEq by Per post-pyloric tube route 2 (two) times daily. (Patient taking differently: Take 20 mEq by mouth 2 (two) times daily. ) 60 tablet 1   No current facility-administered medications on file prior to visit.      Review of Systems Constitutional:   No  weight loss, night sweats,  Fevers, chills,  +fatigue, or  lassitude.  HEENT:   No headaches,  Difficulty sw allowing,  Tooth/dental problems, or  Sore throat,                No sneezing, itching, ear ache, nasal congestion, post nasal drip,   CV:  No chest pain,  Orthopnea, PND, , anasarca, dizziness, palpitations, syncope.   GI  No heartburn, indigestion, abdominal pain, nausea, vomiting, diarrhea, change in bowel habits, loss of appetite, bloody stools.   Resp:  No chest wall deformity  Skin: no rash or lesions.  GU: no dysuria, change in color of urine, no urgency or frequency.  No  flank pain, no hematuria   MS:  No joint pain or swelling.  No decreased range of motion.  No back pain.  Psych:  No change in mood or affect. No depression or anxiety.  No memory loss.         Objective:   Physical Exam  Vitals:   04/21/16 1446  Pulse: 87  SpO2: 98%  Weight: 280 lb (127 kg)  Height: 6\' 1"  (1.854 m)     GEN: A/Ox3; pleasant , NAD, morbidly obese , in WC on O2  HEENT:  [place]/AT,  EACs-clear, TMs-wnl, NOSE-clear, THROAT-clear, no lesions, no postnasal drip or exudate noted.   NECK:  Supple w/ fair ROM; no JVD; normal carotid impulses w/o bruits; no thyromegaly or nodules palpated; no lymphadenopathy.    RESP  decreased breath sounds in the bases without, wheezes. no accessory muscle use, no dullness to percussion  CARD:  RRR, no m/r/g  , 1+  peripheral edema, pulses intact, no cyanosis or clubbing.  GI:   Soft & nt; nml bowel sounds; no organomegaly or masses detected.   Musco: Warm bil, no deformities or joint swelling noted.   Neuro: alert, no focal deficits noted.    Skin: Warm, no lesions or rashes   CXR 06/11/15

## 2016-04-21 NOTE — Patient Instructions (Addendum)
Repeat CT chest March 2018 .  Continue on BIPAP . Wear each night for at least 4-6 hrs .  Work on not smoking .  Will send order for nicotine patches to help quit smoking.  Will check on ONO results.  Chest xray today  Mucinex DM Twice daily  As needed  Congestion.  Follow up with Dr. Elsworth Soho  In 6 months and As needed

## 2016-04-23 ENCOUNTER — Telehealth: Payer: Self-pay | Admitting: Adult Health

## 2016-04-23 MED ORDER — NICOTINE 21 MG/24HR TD PT24: 21.0000 mg | MEDICATED_PATCH | Freq: Every day | TRANSDERMAL | 4 refills | Status: DC

## 2016-04-23 MED ORDER — NICOTINE POLACRILEX 4 MG MT GUM: 4.0000 mg | CHEWING_GUM | OROMUCOSAL | 0 refills | Status: DC | PRN

## 2016-04-23 NOTE — Telephone Encounter (Signed)
Called and spoke with pt and he is aware of rx for the 21 mg patches sent to his pharmacy.  Pt is now requesting that we send in for the 4 mg gum that they told him to use.  RA is this ok to send in as well. thanks

## 2016-04-23 NOTE — Telephone Encounter (Signed)
Called spoke with pt. Aware Rx sent in. Nothing further needed

## 2016-04-23 NOTE — Telephone Encounter (Signed)
Per OV with TP: Repeat CT chest March 2018 .  Continue on BIPAP . Wear each night for at least 4-6 hrs .  Work on not smoking .  Will send order for nicotine patches to help quit smoking.  Will check on ONO results.  Chest xray today  Mucinex DM Twice daily  As needed  Congestion.  Follow up with Dr. Elsworth Soho  In 6 months and As needed   ---  The patches were not called in. Pt called the stop smoking hotline and was advised he needs to start with the strongest patch x 4 weeks then work down on the strengths. TP is out of the office.  Please advise Dr. Elsworth Soho. thanks

## 2016-04-23 NOTE — Telephone Encounter (Signed)
21 mg patch daily for 4 weeks Then 14 mg for 4 weeks Then 7 mg for 4 weeks

## 2016-04-23 NOTE — Telephone Encounter (Signed)
Ok x 30

## 2016-04-24 ENCOUNTER — Encounter: Payer: Self-pay | Admitting: Vascular Surgery

## 2016-04-27 ENCOUNTER — Other Ambulatory Visit: Payer: Medicaid Other | Admitting: *Deleted

## 2016-04-27 DIAGNOSIS — E785 Hyperlipidemia, unspecified: Secondary | ICD-10-CM

## 2016-04-27 DIAGNOSIS — I110 Hypertensive heart disease with heart failure: Secondary | ICD-10-CM

## 2016-04-27 DIAGNOSIS — I10 Essential (primary) hypertension: Secondary | ICD-10-CM

## 2016-04-27 DIAGNOSIS — I5030 Unspecified diastolic (congestive) heart failure: Secondary | ICD-10-CM

## 2016-04-27 LAB — CBC
HCT: 44 % (ref 38.5–50.0)
Hemoglobin: 14.6 g/dL (ref 13.2–17.1)
MCH: 28.7 pg (ref 27.0–33.0)
MCHC: 33.2 g/dL (ref 32.0–36.0)
MCV: 86.6 fL (ref 80.0–100.0)
MPV: 12 fL (ref 7.5–12.5)
PLATELETS: 210 10*3/uL (ref 140–400)
RBC: 5.08 MIL/uL (ref 4.20–5.80)
RDW: 13.9 % (ref 11.0–15.0)
WBC: 9.2 10*3/uL (ref 3.8–10.8)

## 2016-04-27 LAB — BASIC METABOLIC PANEL
BUN: 72 mg/dL — AB (ref 7–25)
CALCIUM: 9.4 mg/dL (ref 8.6–10.3)
CO2: 31 mmol/L (ref 20–31)
Chloride: 97 mmol/L — ABNORMAL LOW (ref 98–110)
Creat: 3.68 mg/dL — ABNORMAL HIGH (ref 0.70–1.33)
GLUCOSE: 177 mg/dL — AB (ref 65–99)
Potassium: 3.6 mmol/L (ref 3.5–5.3)
Sodium: 142 mmol/L (ref 135–146)

## 2016-04-27 LAB — LIPID PANEL
CHOLESTEROL: 186 mg/dL (ref 125–200)
HDL: 27 mg/dL — ABNORMAL LOW (ref >=40)
Total CHOL/HDL Ratio: 6.9 Ratio — ABNORMAL HIGH (ref <=5.0)
Triglycerides: 586 mg/dL — ABNORMAL HIGH (ref <150)

## 2016-04-28 ENCOUNTER — Ambulatory Visit (INDEPENDENT_AMBULATORY_CARE_PROVIDER_SITE_OTHER): Payer: Medicaid Other | Admitting: Vascular Surgery

## 2016-04-28 ENCOUNTER — Encounter: Payer: Self-pay | Admitting: Vascular Surgery

## 2016-04-28 VITALS — BP 160/94 | HR 71 | Temp 99.4°F | Resp 20 | Ht 73.0 in | Wt 250.0 lb

## 2016-04-28 DIAGNOSIS — N185 Chronic kidney disease, stage 5: Secondary | ICD-10-CM

## 2016-04-28 LAB — BRAIN NATRIURETIC PEPTIDE: Brain Natriuretic Peptide: 29.5 pg/mL (ref <100)

## 2016-04-28 NOTE — Progress Notes (Signed)
Patient name: Jerry Cantrell MRN: [id number] DOB: [birth year] Sex: male  REASON FOR VISIT: Follow-up left radiocephalic AV fistula creation by myself on 02/06/2016  HPI: Jerry Cantrell is a 50 y.o. male here today for follow-up. He has not started hemodialysis currently. Is being evaluated for possible pancreas transplant. Has had no, occasions related to his left arm AV fistula  Current Outpatient Prescriptions  Medication Sig Dispense Refill  . albuterol (PROVENTIL HFA;VENTOLIN HFA) 108 (90 BASE) MCG/ACT inhaler Inhale 2 puffs into the lungs every 4 (four) hours as needed for wheezing or shortness of breath. 1 Inhaler 6  . amitriptyline (ELAVIL) 50 MG tablet Take 1 tablet (50 mg total) by mouth at bedtime. 30 tablet 1  . amLODipine (NORVASC) 5 MG tablet Take 1 tablet (5 mg total) by mouth daily. 30 tablet 0  . aspirin 325 MG tablet Take 1 tablet (325 mg total) by mouth daily. 30 tablet 0  . atorvastatin (LIPITOR) 80 MG tablet Take 1 tablet (80 mg total) by mouth daily. 30 tablet 0  . carvedilol (COREG) 25 MG tablet Take 1 tablet (25 mg total) by mouth 2 (two) times daily with a meal. 28 tablet 0  . cloNIDine (CATAPRES) 0.3 MG tablet Take 1 tablet (0.3 mg total) by mouth 3 (three) times daily. 90 tablet 0  . fluticasone (FLONASE) 50 MCG/ACT nasal spray Place 1 spray into both nostrils daily.    . furosemide (LASIX) 80 MG tablet Take 2 tablets (160 mg total) by mouth 2 (two) times daily. 120 tablet 1  . gabapentin (NEURONTIN) 300 MG capsule Take 1 capsule (300 mg total) by mouth 3 (three) times daily. 200 mg in the morning and 100 mg in the evening. (Patient taking differently: Take 300 mg by mouth 3 (three) times daily. ) 90 capsule 0  . hydrALAZINE (APRESOLINE) 100 MG tablet Take 1 tablet (100 mg total) by mouth 3 (three) times daily. 90 tablet 3  . hydrOXYzine (ATARAX/VISTARIL) 25 MG tablet Take 1 tablet (25 mg total) by mouth 3 (three) times daily as needed for  itching. 30 tablet 0  . insulin aspart (NOVOLOG) 100 UNIT/ML injection Inject 0-60 Units into the skin 3 (three) times daily with meals. Inject into the skin as directed based on sliding scale daily    . insulin glargine (LANTUS) 100 UNIT/ML injection Inject 35-45 Units into the skin 2 (two) times daily. Inject 45 units into the skin daily in the morning. Inject 35 units into the skin daily at night    . metolazone (ZAROXOLYN) 5 MG tablet Take 1 tablet (5 mg total) by mouth daily. (Patient taking differently: Take 5 mg by mouth daily. Take this with second dose of lasix in the afternoon) 30 tablet 0  . Multiple Vitamins-Minerals (CENTRUM SILVER ADULT 50+) TABS Take 1 tablet by mouth daily.    . nicotine (NICODERM CQ) 21 mg/24hr patch Place 1 patch (21 mg total) onto the skin daily. 28 patch 4  . nicotine polacrilex (NICORETTE) 4 MG gum Take 1 each (4 mg total) by mouth as needed for smoking cessation. 30 each 0  . nitroGLYCERIN (NITROSTAT) 0.4 MG SL tablet Place 1 tablet (0.4 mg total) under the tongue every 5 (five) minutes as needed for chest pain. 25 tablet prn  . oxyCODONE-acetaminophen (PERCOCET/ROXICET) 5-325 MG tablet Take 1 tablet by mouth every 6 (six) hours as needed. 6 tablet 0  . Polyvinyl Alcohol-Povidone (REFRESH OP) Place 2 drops into both eyes 3 (three) times daily as needed (  dry eyes).    . Potassium Chloride ER 20 MEQ TBCR 20 mEq by Per post-pyloric tube route 2 (two) times daily. (Patient taking differently: Take 20 mEq by mouth 2 (two) times daily. ) 60 tablet 1   No current facility-administered medications for this visit.      PHYSICAL EXAM: Vitals:   04/28/16 1352 04/28/16 1353  BP: (!) 160/100 (!) 160/94  Pulse: 71   Resp: 20   Temp: 99.4 F (37.4 C)   TempSrc: Oral   SpO2: 97%   Weight: 250 lb (113.4 kg)   Height: 6\' 1"  (1.854 m)     GENERAL: The patient is a well-nourished male, in no acute distress. The vital signs are documented above. Completely healed  incision at the radiocephalic level. Excellent maturation of his cephalic vein fistula throughout its course. The main outflow at the antecubital spaces into the basilic system. He has proximally 6-7 mm diameter throughout its course and is quite close to the surface and runs of a straight course as well.  MEDICAL ISSUES: Excellent Jerry Cantrell maturation of his fistula. Should be available for access at any time. Will see [place] again as needed  Patient does have a questions regarding superficial rash potentially related to his renal insufficiency is asking if herbal supplements or other options are available. I have asked him to check with Dr. Justin Mend about treatment options before attempting anything on his own. We'll see him again as needed   Jerry Posner, MD [facility] Tel [phone] Pager [phone]

## 2016-04-30 ENCOUNTER — Telehealth: Payer: Self-pay | Admitting: Internal Medicine

## 2016-04-30 ENCOUNTER — Telehealth: Payer: Self-pay | Admitting: Adult Health

## 2016-04-30 DIAGNOSIS — G4733 Obstructive sleep apnea (adult) (pediatric): Secondary | ICD-10-CM

## 2016-04-30 NOTE — Telephone Encounter (Signed)
Pt is aware of lab results and MD's recommendations.Pt states he is getting ready for a renal transplant procedure.  Pt wants to know when Dr. Harrington Challenger said  he needs to be seen in her clinic. Pt also states that he has a new PCP in the "[facility]"

## 2016-04-30 NOTE — Telephone Encounter (Signed)
Verbal order from TP after reviewing ONO on RA with BIPAP   Begin 2L O2 with Bipap or Bipap titration  Called spoke with pt. Reviewed TP's recs. Pt requested the results from his cxr on 04/21/16. I explained to him that I would send the message to TP and return his call with her recs. She voiced understanding and had no further questions.   TP please advise

## 2016-04-30 NOTE — Telephone Encounter (Signed)
New message    Pt verbalized that he is returning call for rn for lab results

## 2016-05-01 NOTE — Assessment & Plan Note (Signed)
Smoking cessation  Check cxr  Add mucinex As needed

## 2016-05-01 NOTE — Assessment & Plan Note (Signed)
Cont to follow   Plan  Patient Instructions  Repeat CT chest March 2018 .  Continue on BIPAP . Wear each night for at least 4-6 hrs .  Work on not smoking .  Will send order for nicotine patches to help quit smoking.  Will check on ONO results.  Chest xray today  Mucinex DM Twice daily  As needed  Congestion.  Follow up with Dr. Elsworth Soho  In 6 months and As needed

## 2016-05-01 NOTE — Assessment & Plan Note (Signed)
Compensated   Plan  Patient Instructions  Repeat CT chest March 2018 .  Continue on BIPAP . Wear each night for at least 4-6 hrs .  Work on not smoking .  Will send order for nicotine patches to help quit smoking.  Will check on ONO results.  Chest xray today  Mucinex DM Twice daily  As needed  Congestion.  Follow up with Dr. Elsworth Soho  In 6 months and As needed

## 2016-05-01 NOTE — Telephone Encounter (Signed)
Set for October / November

## 2016-05-05 NOTE — Telephone Encounter (Signed)
Per verbal order from TP  No sign of PNA Continue with office recs.   LVM for pt to return call.

## 2016-05-06 NOTE — Telephone Encounter (Signed)
Spoke with pt, aware of results.  Nothing further needed.  

## 2016-05-06 NOTE — Telephone Encounter (Signed)
[phone] pt calling back

## 2016-05-06 NOTE — Telephone Encounter (Signed)
lmtcb X2 for pt to relay cxr results.

## 2016-05-06 NOTE — Telephone Encounter (Signed)
Placed 1 month recall in EPIC for patient to return Oct/Nov.

## 2016-05-27 ENCOUNTER — Telehealth: Payer: Self-pay

## 2016-05-27 NOTE — Telephone Encounter (Addendum)
Thanks for checking with me on that.  Yes, I see that on his last colonoscopy report.  Unfortunately, they did not list the type of prep used.  I think to be on the safe side, especially considering difficulty scheduling him thus far, we should give him a modified prep.  However, he has stage 5 kidney disease and heading toward dialysis.  He is also has insulin-requiring diabetes.  Therefore, must use a Moviprep evening before and AM of procedure.  Must have AM procedure slot. Usual dietary instructions for the days prior to procedure.  For 2 days prior to prep day, use miralax powder 1 capful twice daily.    I think that is the best balance of safety and efficacy for a prep, given this patient's medical issues.

## 2016-05-27 NOTE — Telephone Encounter (Signed)
Dr Loletha Carrow,  Last report 2007 noted FAIR PREP in right colon and POOR PREP at cecum.  ?2 day prep?                                                                   Thank you,                                                                         Gl[place]ds [place]/PV

## 2016-05-29 ENCOUNTER — Ambulatory Visit (AMBULATORY_SURGERY_CENTER): Payer: Self-pay

## 2016-05-29 ENCOUNTER — Telehealth: Payer: Self-pay | Admitting: Gastroenterology

## 2016-05-29 ENCOUNTER — Encounter (HOSPITAL_COMMUNITY): Payer: Self-pay | Admitting: *Deleted

## 2016-05-29 VITALS — Ht 73.0 in | Wt 187.0 lb

## 2016-05-29 DIAGNOSIS — Z860100 Personal history of colon polyps, unspecified: Secondary | ICD-10-CM

## 2016-05-29 MED ORDER — MOVIPREP 100 G PO SOLR: 1.0000 | Freq: Once | ORAL | 0 refills | Status: AC

## 2016-05-29 MED ORDER — POLYETHYLENE GLYCOL 3350 17 GM/SCOOP PO POWD: 1.0000 | Freq: Every day | ORAL | 3 refills | Status: DC

## 2016-05-29 NOTE — Progress Notes (Signed)
No allergies to eggs or soy No past problems with anesthesia No diet meds No home oxygen  Declined emmi 

## 2016-05-29 NOTE — Telephone Encounter (Signed)
Called back to [facility] [phone]), corrected instructions on Miralax. Patient should start taking it 2 days before starting prep, one capful (17 gm) twice a day.

## 2016-06-02 ENCOUNTER — Ambulatory Visit: Payer: Self-pay | Admitting: Pulmonary Disease

## 2016-06-04 NOTE — Anesthesia Preprocedure Evaluation (Addendum)
Anesthesia Evaluation  Patient identified by MRN, date of birth, ID band Patient awake    Reviewed: Allergy & Precautions, NPO status , Patient's Chart, lab work & pertinent test results, reviewed documented beta blocker date and time   History of Anesthesia Complications Negative for: history of anesthetic complications  Airway Mallampati: III  TM Distance: >3 FB Neck ROM: Full    Dental  (+) Edentulous Upper, Dental Advisory Given 7 teeth on the bottom, none are loose:   Pulmonary neg shortness of breath, sleep apnea and Continuous Positive Airway Pressure Ventilation , neg COPD, neg recent URI, Current Smoker,    Pulmonary exam normal breath sounds clear to auscultation       Cardiovascular hypertension, Pt. on medications and Pt. on home beta blockers + angina (has not ever needed his nitroglycerin) at rest +CHF  (-) Past MI, (-) Cardiac Stents, (-) CABG, (-) Orthopnea and (-) PND (-) dysrhythmias  Rhythm:Regular Rate:Normal  HLD  10/13/15 EKG: NSR, anteroseptal infarct (age undetermined), T wave abnormality, consider laterral ischemia, prolonged QT (QT 424/QTc 55ms).   10/14/15 Echo: Study Conclusions - Left ventricle: The cavity size was normal. There was severe concentric hypertrophy. Systolic function was normal. The estimated ejection fraction was in the range of 60% to 65%. Wall motion was normal; there were no regional wall motion abnormalities. There was an increased relative contribution of atrial contraction to ventricular filling. Doppler parameters are  consistent with abnormal left ventricular relaxation (grade 1 diastolic dysfunction). - Right ventricle: The cavity size was mildly dilated. Wall  thickness was normal. (Previous echocardiograms showed EF 55% 05/15/15, 55-60% 01/18/15.)   07/31/15 Nuclear stress test:  The left ventricular ejection fraction is moderately decreased (30-44%). Nuclear stress EF:  38%. There was no ST segment deviation noted during stress. This is a low risk study. There is no ischemia and no evidence of previous MI . 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    Neuro/Psych neg Seizures PSYCHIATRIC DISORDERS Depression Lumbar spondylosis, meralgia paresthetica    GI/Hepatic Bowel prep,neg GERD  ,(+)     substance abuse (reports last use was >6 months ago)  cocaine use,   Endo/Other  diabetes, Type 2, Insulin Dependent  Renal/GU CRFRenal disease (has fistula in left arm but not yet on dialysis)     Musculoskeletal  (+) Arthritis ,   Abdominal   Peds  Hematology negative hematology ROS (+)   Anesthesia Other Findings retinopathy  Reproductive/Obstetrics                            Anesthesia Physical Anesthesia Plan  ASA: IV  Anesthesia Plan: MAC   Post-op Pain Management:    Induction: Intravenous  Airway Management Planned: Natural Airway and Nasal Cannula  Additional Equipment:   Intra-op Plan:   Post-operative Plan:   Informed Consent: I have reviewed the patients History and Physical, chart, labs and discussed the procedure including the risks, benefits and alternatives for the proposed anesthesia with the patient or authorized representative who has indicated his/her understanding and acceptance.   Dental advisory given  Plan Discussed with: CRNA  Anesthesia Plan Comments:        Anesthesia Quick Evaluation

## 2016-06-05 ENCOUNTER — Ambulatory Visit (HOSPITAL_COMMUNITY)
Admission: RE | Admit: 2016-06-05 | Discharge: 2016-06-05 | Disposition: A | Discharge status: Home / Self Care | Payer: Medicaid Other | Source: Ambulatory Visit | Attending: Gastroenterology | Admitting: Gastroenterology

## 2016-06-05 ENCOUNTER — Encounter (HOSPITAL_COMMUNITY)
Admission: RE | Disposition: A | Discharge status: Home / Self Care | Payer: Self-pay | Source: Ambulatory Visit | Attending: Gastroenterology

## 2016-06-05 ENCOUNTER — Ambulatory Visit (HOSPITAL_COMMUNITY): Payer: Medicaid Other | Admitting: Anesthesiology

## 2016-06-05 ENCOUNTER — Encounter (HOSPITAL_COMMUNITY): Payer: Self-pay

## 2016-06-05 DIAGNOSIS — I5032 Chronic diastolic (congestive) heart failure: Secondary | ICD-10-CM | POA: Diagnosis not present

## 2016-06-05 DIAGNOSIS — Z86010 Personal history of colonic polyps: Secondary | ICD-10-CM | POA: Diagnosis not present

## 2016-06-05 DIAGNOSIS — I110 Hypertensive heart disease with heart failure: Secondary | ICD-10-CM | POA: Diagnosis not present

## 2016-06-05 DIAGNOSIS — G4730 Sleep apnea, unspecified: Secondary | ICD-10-CM | POA: Diagnosis not present

## 2016-06-05 DIAGNOSIS — E1121 Type 2 diabetes mellitus with diabetic nephropathy: Secondary | ICD-10-CM | POA: Diagnosis not present

## 2016-06-05 DIAGNOSIS — Z79899 Other long term (current) drug therapy: Secondary | ICD-10-CM | POA: Diagnosis not present

## 2016-06-05 DIAGNOSIS — E785 Hyperlipidemia, unspecified: Secondary | ICD-10-CM | POA: Insufficient documentation

## 2016-06-05 DIAGNOSIS — E669 Obesity, unspecified: Secondary | ICD-10-CM | POA: Insufficient documentation

## 2016-06-05 DIAGNOSIS — M48061 Spinal stenosis, lumbar region without neurogenic claudication: Secondary | ICD-10-CM | POA: Diagnosis not present

## 2016-06-05 DIAGNOSIS — K219 Gastro-esophageal reflux disease without esophagitis: Secondary | ICD-10-CM | POA: Diagnosis not present

## 2016-06-05 DIAGNOSIS — F329 Major depressive disorder, single episode, unspecified: Secondary | ICD-10-CM | POA: Insufficient documentation

## 2016-06-05 DIAGNOSIS — Z800 Family history of malignant neoplasm of digestive organs: Secondary | ICD-10-CM

## 2016-06-05 DIAGNOSIS — K5730 Diverticulosis of large intestine without perforation or abscess without bleeding: Secondary | ICD-10-CM | POA: Insufficient documentation

## 2016-06-05 DIAGNOSIS — Z1211 Encounter for screening for malignant neoplasm of colon: Secondary | ICD-10-CM | POA: Insufficient documentation

## 2016-06-05 HISTORY — PX: COLONOSCOPY WITH PROPOFOL: SHX5780

## 2016-06-05 LAB — GLUCOSE, CAPILLARY: GLUCOSE-CAPILLARY: 249 mg/dL — AB (ref 65–99)

## 2016-06-05 SURGERY — COLONOSCOPY WITH PROPOFOL
Anesthesia: Monitor Anesthesia Care

## 2016-06-05 MED ORDER — PROPOFOL 10 MG/ML IV BOLUS
INTRAVENOUS | Status: DC | PRN
  Administered 2016-06-05 (×2): 20 mg via INTRAVENOUS
  Administered 2016-06-05: 50 mg via INTRAVENOUS
  Administered 2016-06-05: 20 mg via INTRAVENOUS
  Administered 2016-06-05 (×2): 10 mg via INTRAVENOUS
  Administered 2016-06-05: 40 mg via INTRAVENOUS
  Administered 2016-06-05: 30 mg via INTRAVENOUS
  Administered 2016-06-05: 50 mg via INTRAVENOUS

## 2016-06-05 MED ORDER — PROPOFOL 10 MG/ML IV BOLUS
INTRAVENOUS | Status: AC
  Filled 2016-06-05: qty 40

## 2016-06-05 MED ORDER — FENTANYL CITRATE (PF) 100 MCG/2ML IJ SOLN: 25.0000 ug | INTRAMUSCULAR | Status: DC | PRN

## 2016-06-05 MED ORDER — SODIUM CHLORIDE 0.9 % IV SOLN: INTRAVENOUS | Status: DC

## 2016-06-05 MED ORDER — LACTATED RINGERS IV SOLN
INTRAVENOUS | Status: DC | PRN
  Administered 2016-06-05: 08:00:00 via INTRAVENOUS

## 2016-06-05 MED ORDER — PROMETHAZINE HCL 25 MG/ML IJ SOLN: 6.2500 mg | INTRAMUSCULAR | Status: DC | PRN

## 2016-06-05 SURGICAL SUPPLY — 22 items

## 2016-06-05 NOTE — Transfer of Care (Signed)
Immediate Anesthesia Transfer of Care Note  Patient: Jerry Cantrell  Procedure(s) Performed: Procedure(s): COLONOSCOPY WITH PROPOFOL (N/A)  Patient Location: PACU  Anesthesia Type:MAC  Level of Consciousness: awake, alert  and oriented  Airway & Oxygen Therapy: Patient Spontanous Breathing and Patient connected to nasal cannula oxygen  Post-op Assessment: Report given to RN and Post -op Vital signs reviewed and stable  Post vital signs: Reviewed and stable  Last Vitals:  Vitals:   06/05/16 0743 06/05/16 0745  BP: (!) 149/82   Pulse: 69   Resp: 16   Temp:  37 C    Last Pain: There were no vitals filed for this visit.       Complications: No apparent anesthesia complications

## 2016-06-05 NOTE — H&P (Signed)
History:  This patient presents for endoscopic testing for colon cancer screening.  Parthenia Ames Referring physician: Sharon Seller, NP  Past Medical History: Past Medical History:  Diagnosis Date  . Acute diastolic heart failure ([place])   . CHF (congestive heart failure) ([place])   . Diabetes mellitus with nephropathy ([place]) 05/12/2012   Dr. Justin Mend follows  . Hemorrhoids 05/12/2012  . Hyperlipidemia   . Hypertension   . Lumbar spinal stenosis 05/03/2012   Mild with only right L4 nerve root encroachment, no neurogenic claudication   . Lumbar spondylosis 05/03/2012  . Meralgia paraesthetica 05/03/2012  . Meralgia paraesthetica 05/03/2012   On Lyrica which does improve pain.   Marland Kitchen Neuropathy in diabetes ([place]) 05/12/2012  . Obesity   . Pneumonia   . Retinopathy   . Sleep apnea    uses cpap  . Substance abuse   . Urinary incontinence 05/12/2012   Since the start of Sept. 2013    He will be evaluated for transplant and most likely need hemodialysis soon.  Past Surgical History: Past Surgical History:  Procedure Laterality Date  . ABDOMINAL SURGERY     Abscess I&D 2/2 infected hair  . AV FISTULA PLACEMENT Left 02/06/2016   Procedure: LEFT ARM RADIOCEPHALIC ARTERIOVENOUS (AV) FISTULA CREATION;  Surgeon: Rosetta Posner, MD;  Location: [facility];  Service: Vascular;  Laterality: Left;  . COLONOSCOPY W/ POLYPECTOMY     pt to bring records  . TONSILLECTOMY      Allergies: No Known Allergies  Outpatient Meds: Current Facility-Administered Medications  Medication Dose Route Frequency Provider Last Rate Last Dose  . 0.9 %  sodium chloride infusion   Intravenous Continuous [name], MD      . fentaNYL (SUBLIMAZE) injection 25-50 mcg  25-50 mcg Intravenous Q5 min PRN Nilda Simmer, MD      . promethazine [facility]) injection 6.25-12.5 mg  6.25-12.5 mg Intravenous Q15 min PRN Nilda Simmer, MD           ___________________________________________________________________ Objective   Exam:  BP (!) 149/82   Pulse 69   Temp 98.6 F (37 C)   Resp 16   SpO2 97%    CV: RRR without murmur, S1/S2, no JVD, no peripheral edema  Resp: clear to auscultation bilaterally, normal RR and effort noted  GI: soft, no tenderness, with active bowel sounds. No guarding or palpable organomegaly noted.  Neuro: awake, alert and oriented x 3. Normal gross motor function and fluent speech Fistula left forearm  Assessment:  screening  Plan:  colonoscopy   [name]

## 2016-06-05 NOTE — Anesthesia Postprocedure Evaluation (Signed)
Anesthesia Post Note  Patient: Jerry Cantrell  Procedure(s) Performed: Procedure(s) (LRB): COLONOSCOPY WITH PROPOFOL (N/A)  Patient location during evaluation: PACU Anesthesia Type: MAC Level of consciousness: awake and alert Pain management: pain level controlled Vital Signs Assessment: post-procedure vital signs reviewed and stable Respiratory status: spontaneous breathing, nonlabored ventilation and respiratory function stable Cardiovascular status: stable and blood pressure returned to baseline Anesthetic complications: no    Last Vitals:  Vitals:   06/05/16 0857 06/05/16 0910  BP: 129/70 (!) 163/83  Pulse: 65   Resp: 13   Temp: 36.5 C     Last Pain:  Vitals:   06/05/16 0857  TempSrc: Oral                 Nilda Simmer

## 2016-06-05 NOTE — Interval H&P Note (Signed)
History and Physical Interval Note:  06/05/2016 8:32 AM  Jerry Cantrell  has presented today for surgery, with the diagnosis of family history colon cancer  The various methods of treatment have been discussed with the patient and family. After consideration of risks, benefits and other options for treatment, the patient has consented to  Procedure(s): COLONOSCOPY WITH PROPOFOL (N/A) as a surgical intervention .  The patient's history has been reviewed, patient examined, no change in status, stable for surgery.  I have reviewed the patient's chart and labs.  Questions were answered to the patient's satisfaction.     Jerry Cantrell

## 2016-06-05 NOTE — Discharge Instructions (Signed)
YOU HAD AN ENDOSCOPIC PROCEDURE TODAY: Refer to the procedure report and other information in the discharge instructions given to you for any specific questions about what was found during the examination. If this information does not answer your questions, please call [place] office at [phone] to clarify.  ° °YOU SHOULD EXPECT: Some feelings of bloating in the abdomen. Passage of more gas than usual. Walking can help get rid of the air that was put into your GI tract during the procedure and reduce the bloating. If you had a lower endoscopy (such as a colonoscopy or flexible sigmoidoscopy) you may notice spotting of blood in your stool or on the toilet paper. Some abdominal soreness may be present for a day or two, also. ° °DIET: Your first meal following the procedure should be a light meal and then it is ok to progress to your normal diet. A half-sandwich or bowl of soup is an example of a good first meal. Heavy or fried foods are harder to digest and may make you feel nauseous or bloated. Drink plenty of fluids but you should avoid alcoholic beverages for 24 hours. If you had a esophageal dilation, please see attached instructions for diet.   ° °ACTIVITY: Your care partner should take you home directly after the procedure. You should plan to take it easy, moving slowly for the rest of the day. You can resume normal activity the day after the procedure however YOU SHOULD NOT DRIVE, use power tools, machinery or perform tasks that involve climbing or major physical exertion for 24 hours (because of the sedation medicines used during the test).  ° °SYMPTOMS TO REPORT IMMEDIATELY: °A gastroenterologist can be reached at any hour. Please call [phone]  for any of the following symptoms:  °Following lower endoscopy (colonoscopy, flexible sigmoidoscopy) °Excessive amounts of blood in the stool  °Significant tenderness, worsening of abdominal pains  °Swelling of the abdomen that is new, acute  °Fever of 100° or  higher  °Following upper endoscopy (EGD, EUS, ERCP, esophageal dilation) °Vomiting of blood or coffee ground material  °New, significant abdominal pain  °New, significant chest pain or pain under the shoulder blades  °Painful or persistently difficult swallowing  °New shortness of breath  °Black, tarry-looking or red, bloody stools ° °FOLLOW UP:  °If any biopsies were taken you will be contacted by phone or by letter within the next 1-3 weeks. Call [phone]  if you have not heard about the biopsies in 3 weeks.  °Please also call with any specific questions about appointments or follow up tests. ° °

## 2016-06-05 NOTE — Op Note (Signed)
[facility] Patient Name: Jerry Cantrell Procedure Date: 06/05/2016 MRN: [id number] Attending MD: Estill Cotta. Loletha Carrow , MD Date of Birth: [birth year] CSN: [id number] Age: 50 Admit Type: Outpatient Procedure:                Colonoscopy Indications:              High risk colon cancer surveillance: Personal                            history of colonic polyps ( sub-centimeter polyps                            of unknown pathology at outside institution in                            08/2009) Providers:                Estill Cotta. Loletha Carrow, MD, Zenon Mayo, RN, Cherylynn Ridges, Technician, [place] Alday CRNA, CRNA Referring MD:              Medicines:                Monitored Anesthesia Care Complications:            No immediate complications. Estimated Blood Loss:     Estimated blood loss: none. Procedure:                Pre-Anesthesia Assessment:                           - Prior to the procedure, a History and Physical                            was performed, and patient medications and                            allergies were reviewed. The patient's tolerance of                            previous anesthesia was also reviewed. The risks                            and benefits of the procedure and the sedation                            options and risks were discussed with the patient.                            All questions were answered, and informed consent                            was obtained. Anticoagulants: The patient has taken  aspirin. It was decided not to withhold this                            medication prior to the procedure. ASA Grade                            Assessment: III - A patient with severe systemic                            disease. After reviewing the risks and benefits,                            the patient was deemed in satisfactory condition to                            undergo the  procedure.                           After obtaining informed consent, the colonoscope                            was passed under direct vision. Throughout the                            procedure, the patient's blood pressure, pulse, and                            oxygen saturations were monitored continuously. The                            EC-[id number] ([id number]) scope was introduced through                            the anus and advanced to the the cecum, identified                            by appendiceal orifice and ileocecal valve. The                            colonoscopy was performed without difficulty. The                            patient tolerated the procedure well. The quality                            of the bowel preparation was good. The ileocecal                            valve, appendiceal orifice, and rectum were                            photographed. The bowel preparation used was  Miralax and MoviPrep. The quality of the bowel                            preparation was evaluated using the BBPS [facility]                            Bowel Preparation Scale) with scores of: Right                            Colon = 2, Transverse Colon = 2 and Left Colon = 2.                            The total BBPS score equals 6. Scope In: 8:41:36 AM Scope Out: 8:53:56 AM Scope Withdrawal Time: 0 hours 10 minutes 22 seconds  Total Procedure Duration: 0 hours 12 minutes 20 seconds  Findings:      Multiple diverticula were found in the left colon and right colon.      The exam was otherwise without abnormality on direct and retroflexion       views. Impression:               - Diverticulosis in the left colon and in the right                            colon.                           - The examination was otherwise normal on direct                            and retroflexion views.                           - No specimens collected. Moderate Sedation:       MAC sedation used Recommendation:           - Patient has a contact number available for                            emergencies. The signs and symptoms of potential                            delayed complications were discussed with the                            patient. Return to normal activities tomorrow.                            Written discharge instructions were provided to the                            patient.                           - Resume previous diet.                           -  Continue present medications.                           - Repeat colonoscopy in 10 years for surveillance                            purposes. Procedure Code(s):        --- Professional ---                           [phone], Colonoscopy, flexible; diagnostic, including                            collection of specimen(s) by brushing or washing,                            when performed (separate procedure) Diagnosis Code(s):        --- Professional ---                           Z86.010, Personal history of colonic polyps                           K57.30, Diverticulosis of large intestine without                            perforation or abscess without bleeding CPT copyright 2016 American Medical Association. All rights reserved. The codes documented in this report are preliminary and upon coder review may  be revised to meet current compliance requirements. Henry L. Loletha Carrow, MD 06/05/2016 9:04:55 AM This report has been signed electronically. Number of Addenda: 0

## 2016-06-08 ENCOUNTER — Encounter (HOSPITAL_COMMUNITY): Payer: Self-pay | Admitting: Gastroenterology

## 2016-06-16 ENCOUNTER — Ambulatory Visit (INDEPENDENT_AMBULATORY_CARE_PROVIDER_SITE_OTHER): Payer: Medicaid Other | Admitting: Family Medicine

## 2016-06-16 ENCOUNTER — Encounter: Payer: Self-pay | Admitting: Family Medicine

## 2016-06-16 VITALS — BP 144/81 | HR 72 | Temp 98.2°F | Resp 20 | Ht 73.0 in | Wt 290.0 lb

## 2016-06-16 DIAGNOSIS — E1121 Type 2 diabetes mellitus with diabetic nephropathy: Secondary | ICD-10-CM

## 2016-06-16 DIAGNOSIS — I10 Essential (primary) hypertension: Secondary | ICD-10-CM | POA: Diagnosis not present

## 2016-06-16 DIAGNOSIS — Z23 Encounter for immunization: Secondary | ICD-10-CM | POA: Diagnosis not present

## 2016-06-16 LAB — COMPLETE METABOLIC PANEL WITH GFR
ALBUMIN: 3.7 g/dL (ref 3.6–5.1)
ALK PHOS: 104 U/L (ref 40–115)
ALT: 15 U/L (ref 9–46)
AST: 14 U/L (ref 10–35)
BUN: 64 mg/dL — ABNORMAL HIGH (ref 7–25)
CALCIUM: 9.1 mg/dL (ref 8.6–10.3)
CHLORIDE: 99 mmol/L (ref 98–110)
CO2: 30 mmol/L (ref 20–31)
Creat: 3.47 mg/dL — ABNORMAL HIGH (ref 0.70–1.33)
GFR, EST NON AFRICAN AMERICAN: 19 mL/min — AB (ref >=60)
GFR, Est African American: 22 mL/min — ABNORMAL LOW (ref >=60)
Glucose, Bld: 133 mg/dL — ABNORMAL HIGH (ref 65–99)
POTASSIUM: 3.6 mmol/L (ref 3.5–5.3)
Sodium: 141 mmol/L (ref 135–146)
Total Bilirubin: 0.4 mg/dL (ref 0.2–1.2)
Total Protein: 6.6 g/dL (ref 6.1–8.1)

## 2016-06-16 LAB — LIPID PANEL
CHOL/HDL RATIO: 6.3 ratio — AB (ref <=5.0)
CHOLESTEROL: 163 mg/dL (ref 125–200)
HDL: 26 mg/dL — AB (ref >=40)
TRIGLYCERIDES: 511 mg/dL — AB (ref <150)

## 2016-06-16 LAB — TSH: TSH: 2.57 mIU/L (ref 0.40–4.50)

## 2016-06-16 LAB — POCT GLYCOSYLATED HEMOGLOBIN (HGB A1C): Hemoglobin A1C: 8.2

## 2016-06-16 MED ORDER — FLUTICASONE PROPIONATE 50 MCG/ACT NA SUSP: 1.0000 | Freq: Every day | NASAL | 3 refills | Status: DC

## 2016-06-16 MED ORDER — PNEUMOCOCCAL 13-VAL CONJ VACC IM SUSP
0.5000 mL | INTRAMUSCULAR | Status: AC | PRN
  Administered 2016-06-16: 0.5 mL via INTRAMUSCULAR

## 2016-06-16 MED ORDER — FLUTICASONE PROPIONATE 50 MCG/ACT NA SUSP: 2.0000 | Freq: Every day | NASAL | 3 refills | Status: DC

## 2016-06-17 LAB — MICROALBUMIN, URINE: MICROALB UR: 72.5 mg/dL

## 2016-06-17 NOTE — Patient Instructions (Signed)
Please bring your medications by in a few days. Will probable refer you back to cardiology or kidney doctor for a review of your medications and their appropriateness.

## 2016-06-17 NOTE — Progress Notes (Signed)
Jerry Cantrell, is a 50 y.o. male  HWT:[id number]  [id number]  DOB - Aug 05, 1966  CC:  Chief Complaint  Patient presents with  . Establish Care       HPI: Jerry Cantrell is a 50 y.o. male here to establish care. He has a complicated cardiac and is followed by cardiology. He reports he see her about once a year.  He has diagnoses of hypertension,diastolic heart failure, diabetes 2, neuropathy, CKD and sleep apnea.  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 He did not bring his medictions and is unclear about what he is and isn't taking. We have a list from his pharmacy as to medications he has recently refilled but is still not clear what he is taking. His blood pressure today is 163/83. He has a widespread, non-specific rash and would like to see a dermatologist. He also request referral to podiatry. Hid A1C today is 8.2. He reports being on Lantus 45 units in am and 25 units in pm. He is also on sliding scale Novolog. He reports taking 7-15 units tid.   Health Maintenance: He has a colonoscopy last week. Had recent screening for prostate cancer. Is to receive infuenza and prevnar today.  He had a diabetic eye exam abut 8 months ago. He is blind in his right eye.   No Known Allergies Past Medical History:  Diagnosis Date  . Acute diastolic heart failure ([place])   . CHF (congestive heart failure) ([place])   . Diabetes mellitus with nephropathy ([place]) 05/12/2012   Dr. Justin Mend follows  . Hemorrhoids 05/12/2012  . Hyperlipidemia   . Hypertension   . Lumbar spinal stenosis 05/03/2012   Mild with only right L4 nerve root encroachment, no neurogenic claudication   . Lumbar spondylosis 05/03/2012  . Meralgia paraesthetica 05/03/2012  . Meralgia paraesthetica 05/03/2012   On Lyrica which does improve pain.   Marland Kitchen Neuropathy in diabetes ([place]) 05/12/2012  . Obesity   . Pneumonia   .  Retinopathy   . Sleep apnea    uses cpap  . Substance abuse   . Urinary incontinence 05/12/2012   Since the start of Sept. 2013    Current Outpatient Prescriptions on File Prior to Visit  Medication Sig Dispense Refill  . albuterol (PROVENTIL HFA;VENTOLIN HFA) 108 (90 BASE) MCG/ACT inhaler Inhale 2 puffs into the lungs every 4 (four) hours as needed for wheezing or shortness of breath. 1 Inhaler 6  . amitriptyline (ELAVIL) 50 MG tablet Take 1 tablet (50 mg total) by mouth at bedtime. 30 tablet 1  . amLODipine (NORVASC) 5 MG tablet Take 1 tablet (5 mg total) by mouth daily. 30 tablet 0  . aspirin 325 MG tablet Take 1 tablet (325 mg total) by mouth daily. 30 tablet 0  . atorvastatin (LIPITOR) 80 MG tablet Take 1 tablet (80 mg total) by mouth daily. 30 tablet 0  . carvedilol (COREG) 25 MG tablet Take 1 tablet (25 mg total) by mouth 2 (two) times daily with a meal. 28 tablet 0  . furosemide (LASIX) 80 MG tablet Take 2 tablets (160 mg total) by mouth 2 (two) times daily. 120 tablet 1  . gabapentin (NEURONTIN) 300 MG capsule Take 1 capsule (300 mg total) by mouth 3 (three) times daily. 200 mg in the morning and 100 mg in the evening. (Patient taking differently: Take 300 mg by mouth 3 (  three) times daily. ) 90 capsule 0  . hydrALAZINE (APRESOLINE) 100 MG tablet Take 1 tablet (100 mg total) by mouth 3 (three) times daily. 90 tablet 3  . hydrOXYzine (ATARAX/VISTARIL) 25 MG tablet Take 1 tablet (25 mg total) by mouth 3 (three) times daily as needed for itching. 30 tablet 0  . insulin aspart (NOVOLOG) 100 UNIT/ML injection Inject 0-60 Units into the skin 3 (three) times daily with meals. Inject into the skin as directed based on sliding scale daily    . insulin glargine (LANTUS) 100 UNIT/ML injection Inject 25-45 Units into the skin 2 (two) times daily. Inject 45 units into the skin daily in the morning. Inject 25 units into the skin daily at night    . metolazone (ZAROXOLYN) 5 MG tablet Take 1 tablet (5  mg total) by mouth daily. (Patient taking differently: Take 5 mg by mouth daily. Take this with second dose of lasix in the afternoon) 30 tablet 0  . Multiple Vitamins-Minerals (CENTRUM SILVER ADULT 50+) TABS Take 1 tablet by mouth daily.    . nicotine (NICODERM CQ) 21 mg/24hr patch Place 1 patch (21 mg total) onto the skin daily. 28 patch 4  . nicotine polacrilex (NICORETTE) 4 MG gum Take 1 each (4 mg total) by mouth as needed for smoking cessation. 30 each 0  . nitroGLYCERIN (NITROSTAT) 0.4 MG SL tablet Place 1 tablet (0.4 mg total) under the tongue every 5 (five) minutes as needed for chest pain. 25 tablet prn  . polyethylene glycol powder (GLYCOLAX/MIRALAX) powder Take 255 g by mouth daily. 255 g 3  . Polyvinyl Alcohol-Povidone (REFRESH OP) Place 2 drops into both eyes 3 (three) times daily as needed (dry eyes).    . Potassium Chloride ER 20 MEQ TBCR 20 mEq by Per post-pyloric tube route 2 (two) times daily. (Patient taking differently: Take 20 mEq by mouth 2 (two) times daily. ) 60 tablet 1  . cloNIDine (CATAPRES) 0.3 MG tablet Take 1 tablet (0.3 mg total) by mouth 3 (three) times daily. (Patient not taking: Reported on 06/16/2016) 90 tablet 0   No current facility-administered medications on file prior to visit.    Family History  Problem Relation Age of Onset  . Prostate cancer Father   . Alcohol abuse Father   . Emphysema Father     smoked  . Colon cancer Neg Hx    Social History   Social History  . Marital status: Single    Spouse name: N/A  . Number of children: N/A  . Years of education: N/A   Occupational History  . [employer] services [employer]   Social History Main Topics  . Smoking status: Current Every Day Smoker    Packs/day: 0.50    Years: 30.00    Types: Cigarettes  . Smokeless tobacco: Never Used     Comment: "Trying - hard to stop"  . Alcohol use No     Comment: " about 40 ounce beer per month."  down to a beer every 3 weeks  . Drug use: No     Comment:  marijuana "laced with something"; today, stated "no" to question of illegal drug use 06/16/2016  . Sexual activity: Not on file   Other Topics Concern  . Not on file   Social History Narrative  . No narrative on file    Review of Systems: Constitutional: Negative Skin: + for itchy rash HENT: + for nasal congestion Eyes: + for blindness in right eye. Neck: Negative Respiratory:  Negative Cardiovascular: Negative Gastrointestinal: Negative Genitourinary: Negative  Musculoskeletal: + for low back pain  Neurological: + for neuropathy Hematological: Negative  Psychiatric/Behavioral: Negative    Objective:   Vitals:   06/16/16 1040  BP: (!) 144/81  Pulse: 72  Resp: 20  Temp: 98.2 F (36.8 C)    Physical Exam: Constitutional: Patient appears well-developed and well-nourished. No distress. HENT: Normocephalic, atraumatic, External right and left ear normal. Oropharynx is clear and moist.  Eyes: Conjunctivae and EOM are normal. PERRLA, no scleral icterus. Neck: Normal ROM. Neck supple. No lymphadenopathy, No thyromegaly. CVS: RRR, S1/S2 +, no murmurs, no gallops, no rubs Pulmonary: Effort and breath sounds normal, no stridor, rhonchi, wheezes, rales.  Abdominal: Soft. Normoactive BS,, no distension, tenderness, rebound or guarding.  Musculoskeletal: Normal range of motion. No edema and no tenderness.  Neuro: Alert.Normal muscle tone coordination. Non-focal Skin: Skin is warm and dry Not diaphoretic. There are some excoriated area on arms and legs Psychiatric: Normal mood and affect. Behavior, judgment, thought content normal.  Lab Results  Component Value Date   WBC 9.2 04/27/2016   HGB 14.6 04/27/2016   HCT 44.0 04/27/2016   MCV 86.6 04/27/2016   PLT 210 04/27/2016   Lab Results  Component Value Date   CREATININE 3.47 (H) 06/16/2016   BUN 64 (H) 06/16/2016   NA 141 06/16/2016   K 3.6 06/16/2016   CL 99 06/16/2016   CO2 30 06/16/2016    Lab Results   Component Value Date   HGBA1C 8.2 06/16/2016   Lipid Panel     Component Value Date/Time   CHOL 163 06/16/2016 1147   TRIG 511 (H) 06/16/2016 1147   HDL 26 (L) 06/16/2016 1147   CHOLHDL 6.3 (H) 06/16/2016 1147   VLDL NOT CALC 06/16/2016 1147   LDLCALC NOT CALC 06/16/2016 1147       Assessment and plan:   1. Type 2 diabetes mellitus with diabetic nephropathy, unspecified long term insulin use status (HCC)  - Microalbumin, urine - COMPLETE METABOLIC PANEL WITH GFR - Lipid panel - TSH - HgB A1c  2. Need for prophylactic vaccination and inoculation against influenza  - Flu Vaccine QUAD 36+ mos PF IM (Fluarix & Fluzone Quad PF)  3. Need for prophylactic vaccination against Streptococcus pneumoniae (pneumococcus)  - pneumococcal 13-valent conjugate vaccine (PREVNAR 13) injection 0.5 mL; Inject 0.5 mLs into the muscle Prior to discharge for immunization.   No Follow-up on file.  The patient was given clear instructions to go to ER or return to medical center if symptoms don't improve, worsen or new problems develop. The patient verbalized understanding.    Micheline Chapman FNP  06/17/2016, 1:51 PM

## 2016-06-23 ENCOUNTER — Other Ambulatory Visit: Payer: Self-pay | Admitting: Family Medicine

## 2016-06-23 ENCOUNTER — Telehealth: Payer: Self-pay

## 2016-06-23 DIAGNOSIS — B351 Tinea unguium: Secondary | ICD-10-CM

## 2016-06-23 DIAGNOSIS — R21 Rash and other nonspecific skin eruption: Secondary | ICD-10-CM

## 2016-06-23 NOTE — Telephone Encounter (Signed)
Jerry Cantrell, Patient called and states that he was suppose to get a referral to dermatology for a rash, and to a podiatrist for diabetic foot care. Can you please advise? I didn't see this in referrals. Thanks!

## 2016-07-22 ENCOUNTER — Telehealth: Payer: Self-pay

## 2016-07-22 NOTE — Telephone Encounter (Signed)
Vaughan Basta,  Do you know anything about this? Please advise. Thanks!

## 2016-07-22 NOTE — Telephone Encounter (Signed)
I put in a referral to podiatry. That's all I know.

## 2016-07-27 ENCOUNTER — Telehealth: Payer: Self-pay

## 2016-07-27 NOTE — Telephone Encounter (Signed)
Received paper work for Diabetic Shoes from [employer]" (phone number [phone]) approximately 1 pm today. I spoke with Sharon Seller, NP to fill out and sign, she suggest that we call patient's Podiatrist [facility]) and have them fill out forms since they just did a diabetic foot exam on him last week 07/15/2016. Also the paper work Has to be signed by a MD or DO. I called [facility] and spoke with "Fraser Din" and explained that the paper work should be filled out by podiatrist. She asked me to fax it to their office, so I have faxed [place] today 07/27/2016 @1 :58pm the forms to fill out and fax back to [place]. Thanks!

## 2016-08-11 DIAGNOSIS — R7989 Other specified abnormal findings of blood chemistry: Secondary | ICD-10-CM | POA: Diagnosis not present

## 2016-08-11 DIAGNOSIS — E1122 Type 2 diabetes mellitus with diabetic chronic kidney disease: Secondary | ICD-10-CM | POA: Diagnosis not present

## 2016-08-11 DIAGNOSIS — N2581 Secondary hyperparathyroidism of renal origin: Secondary | ICD-10-CM | POA: Diagnosis not present

## 2016-08-11 DIAGNOSIS — N183 Chronic kidney disease, stage 3 (moderate): Secondary | ICD-10-CM | POA: Diagnosis not present

## 2016-08-11 DIAGNOSIS — F17200 Nicotine dependence, unspecified, uncomplicated: Secondary | ICD-10-CM | POA: Diagnosis not present

## 2016-09-07 ENCOUNTER — Telehealth: Payer: Self-pay | Admitting: Family Medicine

## 2016-09-07 ENCOUNTER — Other Ambulatory Visit: Payer: Self-pay

## 2016-09-07 ENCOUNTER — Other Ambulatory Visit: Payer: Self-pay | Admitting: Family Medicine

## 2016-09-07 DIAGNOSIS — R059 Cough, unspecified: Secondary | ICD-10-CM

## 2016-09-07 DIAGNOSIS — R05 Cough: Secondary | ICD-10-CM

## 2016-09-07 MED ORDER — GLUCOSE BLOOD VI STRP: ORAL_STRIP | 0 refills | Status: DC

## 2016-09-07 MED ORDER — ALBUTEROL SULFATE HFA 108 (90 BASE) MCG/ACT IN AERS: 2.0000 | INHALATION_SPRAY | RESPIRATORY_TRACT | 6 refills | Status: DC | PRN

## 2016-09-07 MED ORDER — INSULIN ASPART 100 UNIT/ML ~~LOC~~ SOLN: 0.0000 [IU] | Freq: Three times a day (TID) | SUBCUTANEOUS | 0 refills | Status: DC

## 2016-09-07 NOTE — Telephone Encounter (Signed)
Called Izora Gala at [facility], told her not to refill Albuterol. She said he uses the Novolog Pen due to his visual impairment and I okayed one month.

## 2016-09-08 ENCOUNTER — Other Ambulatory Visit: Payer: Self-pay

## 2016-09-08 ENCOUNTER — Telehealth: Payer: Self-pay

## 2016-09-08 MED ORDER — GLUCOSE BLOOD VI STRP: ORAL_STRIP | 0 refills | Status: DC

## 2016-09-08 NOTE — Telephone Encounter (Signed)
[facility] pharmacy called asking for clarification on how many times daily patient needs to test blood sugar. I advised them that patient should be testing 4 times daily. Thanks!

## 2016-09-09 ENCOUNTER — Other Ambulatory Visit: Payer: Self-pay | Admitting: Family Medicine

## 2016-09-09 DIAGNOSIS — E118 Type 2 diabetes mellitus with unspecified complications: Secondary | ICD-10-CM

## 2016-09-15 DIAGNOSIS — B86 Scabies: Secondary | ICD-10-CM | POA: Diagnosis not present

## 2016-09-21 ENCOUNTER — Ambulatory Visit (INDEPENDENT_AMBULATORY_CARE_PROVIDER_SITE_OTHER): Payer: Medicare Other | Admitting: Family Medicine

## 2016-09-21 ENCOUNTER — Encounter: Payer: Self-pay | Admitting: Family Medicine

## 2016-09-21 ENCOUNTER — Telehealth: Payer: Self-pay | Admitting: Family Medicine

## 2016-09-21 VITALS — BP 133/75 | HR 90 | Temp 98.0°F | Resp 14 | Ht 73.0 in | Wt 280.0 lb

## 2016-09-21 DIAGNOSIS — R059 Cough, unspecified: Secondary | ICD-10-CM

## 2016-09-21 DIAGNOSIS — E118 Type 2 diabetes mellitus with unspecified complications: Secondary | ICD-10-CM

## 2016-09-21 DIAGNOSIS — R05 Cough: Secondary | ICD-10-CM

## 2016-09-21 LAB — COMPLETE METABOLIC PANEL WITH GFR
ALT: 12 U/L (ref 9–46)
AST: 15 U/L (ref 10–35)
Albumin: 3.6 g/dL (ref 3.6–5.1)
Alkaline Phosphatase: 84 U/L (ref 40–115)
BILIRUBIN TOTAL: 0.3 mg/dL (ref 0.2–1.2)
BUN: 67 mg/dL — ABNORMAL HIGH (ref 7–25)
CHLORIDE: 96 mmol/L — AB (ref 98–110)
CO2: 24 mmol/L (ref 20–31)
CREATININE: 3.6 mg/dL — AB (ref 0.70–1.33)
Calcium: 9 mg/dL (ref 8.6–10.3)
GFR, EST AFRICAN AMERICAN: 21 mL/min — AB (ref >=60)
GFR, Est Non African American: 18 mL/min — ABNORMAL LOW (ref >=60)
Glucose, Bld: 104 mg/dL — ABNORMAL HIGH (ref 65–99)
Potassium: 3.4 mmol/L — ABNORMAL LOW (ref 3.5–5.3)
Sodium: 133 mmol/L — ABNORMAL LOW (ref 135–146)
TOTAL PROTEIN: 6.2 g/dL (ref 6.1–8.1)

## 2016-09-21 LAB — CBC WITH DIFFERENTIAL/PLATELET
BASOS PCT: 0 %
Basophils Absolute: 0 cells/uL (ref 0–200)
EOS ABS: 258 {cells}/uL (ref 15–500)
Eosinophils Relative: 3 %
HEMATOCRIT: 42.5 % (ref 38.5–50.0)
HEMOGLOBIN: 13.5 g/dL (ref 13.2–17.1)
LYMPHS ABS: 1720 {cells}/uL (ref 850–3900)
LYMPHS PCT: 20 %
MCH: 27.7 pg (ref 27.0–33.0)
MCHC: 31.8 g/dL — ABNORMAL LOW (ref 32.0–36.0)
MCV: 87.3 fL (ref 80.0–100.0)
MONO ABS: 430 {cells}/uL (ref 200–950)
MPV: 10.9 fL (ref 7.5–12.5)
Monocytes Relative: 5 %
Neutro Abs: 6192 cells/uL (ref 1500–7800)
Neutrophils Relative %: 72 %
Platelets: 181 10*3/uL (ref 140–400)
RBC: 4.87 MIL/uL (ref 4.20–5.80)
RDW: 14.6 % (ref 11.0–15.0)
WBC: 8.6 10*3/uL (ref 3.8–10.8)

## 2016-09-21 LAB — POCT GLYCOSYLATED HEMOGLOBIN (HGB A1C): Hemoglobin A1C: 7.3

## 2016-09-21 MED ORDER — INSULIN ASPART 100 UNIT/ML ~~LOC~~ SOLN: 0.0000 [IU] | Freq: Three times a day (TID) | SUBCUTANEOUS | 0 refills | Status: DC

## 2016-09-21 MED ORDER — INSULIN GLARGINE 100 UNIT/ML ~~LOC~~ SOLN: 25.0000 [IU] | Freq: Two times a day (BID) | SUBCUTANEOUS | 3 refills | Status: DC

## 2016-09-21 MED ORDER — ALBUTEROL SULFATE HFA 108 (90 BASE) MCG/ACT IN AERS: 2.0000 | INHALATION_SPRAY | RESPIRATORY_TRACT | 6 refills | Status: DC | PRN

## 2016-09-21 MED ORDER — FLUTICASONE PROPIONATE 50 MCG/ACT NA SUSP: 2.0000 | Freq: Every day | NASAL | 3 refills | Status: DC

## 2016-09-21 NOTE — Telephone Encounter (Signed)
Pharmacist called to get authorization for pen instead of vial and the dispensing of 2 insulin pens, 1 pen would not last 1 month. I okayed both.

## 2016-09-21 NOTE — Progress Notes (Signed)
Jerry Cantrell, is a 51 y.o. male  URK:[id number]  GBT:[id number]  DOB - December 18, 1965  CC:  Chief Complaint  Patient presents with  . Diabetes    doing well, kidneys stable   . Nail Problem    needs another referral some issue with Medicaid       HPI: Jerry Cantrell is a 51 y.o. male here for follow-up diabetes. His las A!C 3 months ago, was 8.2. Today is 7.3. He has a complicated history. He has CHF, hypertension, hyperlipidemia, CKD (awaiting transplant). He also has diabetic neuropathy and retinopathy. He is blind in his right eye and has decreased vision in his left eye. He is followed by several different specialist. He is needing refills on lantus, novolog, flonase and albuterol. Other medications are prescribed by specialist. He is request referral to podiatry for thick painful toenails.    No Known Allergies Past Medical History:  Diagnosis Date  . Acute diastolic heart failure ([place])   . CHF (congestive heart failure) ([place])   . Diabetes mellitus with nephropathy ([place]) 05/12/2012   Dr. Justin Mend follows  . Hemorrhoids 05/12/2012  . Hyperlipidemia   . Hypertension   . Lumbar spinal stenosis 05/03/2012   Mild with only right L4 nerve root encroachment, no neurogenic claudication   . Lumbar spondylosis 05/03/2012  . Meralgia paraesthetica 05/03/2012  . Meralgia paraesthetica 05/03/2012   On Lyrica which does improve pain.   Marland Kitchen Neuropathy in diabetes ([place]) 05/12/2012  . Obesity   . Pneumonia   . Retinopathy   . Sleep apnea    uses cpap  . Substance abuse   . Urinary incontinence 05/12/2012   Since the start of Sept. 2013    Current Outpatient Prescriptions on File Prior to Visit  Medication Sig Dispense Refill  . amitriptyline (ELAVIL) 50 MG tablet Take 1 tablet (50 mg total) by mouth at bedtime. 30 tablet 1  . amLODipine (NORVASC) 5 MG tablet Take 1 tablet (5 mg total) by mouth daily. 30 tablet 0  . aspirin 325 MG tablet Take 1 tablet (325 mg total) by mouth daily. 30 tablet 0  .  atorvastatin (LIPITOR) 80 MG tablet Take 1 tablet (80 mg total) by mouth daily. 30 tablet 0  . carvedilol (COREG) 25 MG tablet Take 1 tablet (25 mg total) by mouth 2 (two) times daily with a meal. 28 tablet 0  . cloNIDine (CATAPRES) 0.3 MG tablet Take 1 tablet (0.3 mg total) by mouth 3 (three) times daily. 90 tablet 0  . furosemide (LASIX) 80 MG tablet Take 2 tablets (160 mg total) by mouth 2 (two) times daily. 120 tablet 1  . gabapentin (NEURONTIN) 300 MG capsule Take 1 capsule (300 mg total) by mouth 3 (three) times daily. 200 mg in the morning and 100 mg in the evening. (Patient taking differently: Take 300 mg by mouth 3 (three) times daily. ) 90 capsule 0  . glucose blood (ACCU-CHEK AVIVA) test strip Use as directed to check blood sugar qid before meals. 100 each 0  . hydrALAZINE (APRESOLINE) 100 MG tablet Take 1 tablet (100 mg total) by mouth 3 (three) times daily. 90 tablet 3  . hydrOXYzine (ATARAX/VISTARIL) 25 MG tablet Take 1 tablet (25 mg total) by mouth 3 (three) times daily as needed for itching. 30 tablet 0  . metolazone (ZAROXOLYN) 5 MG tablet Take 1 tablet (5 mg total) by mouth daily. (Patient taking differently: Take 5 mg by mouth daily. Take this with second dose of lasix in  the afternoon) 30 tablet 0  . Multiple Vitamins-Minerals (CENTRUM SILVER ADULT 50+) TABS Take 1 tablet by mouth daily.    . nicotine (NICODERM CQ) 21 mg/24hr patch Place 1 patch (21 mg total) onto the skin daily. 28 patch 4  . nicotine polacrilex (NICORETTE) 4 MG gum Take 1 each (4 mg total) by mouth as needed for smoking cessation. 30 each 0  . nitroGLYCERIN (NITROSTAT) 0.4 MG SL tablet Place 1 tablet (0.4 mg total) under the tongue every 5 (five) minutes as needed for chest pain. 25 tablet prn  . polyethylene glycol powder (GLYCOLAX/MIRALAX) powder Take 255 g by mouth daily. 255 g 3  . Polyvinyl Alcohol-Povidone (REFRESH OP) Place 2 drops into both eyes 3 (three) times daily as needed (dry eyes).    . Potassium  Chloride ER 20 MEQ TBCR 20 mEq by Per post-pyloric tube route 2 (two) times daily. (Patient taking differently: Take 20 mEq by mouth 2 (two) times daily. ) 60 tablet 1  . verapamil (VERELAN PM) 120 MG 24 hr capsule Take 120 mg by mouth at bedtime.     No current facility-administered medications on file prior to visit.    Family History  Problem Relation Age of Onset  . Prostate cancer Father   . Alcohol abuse Father   . Emphysema Father     smoked  . Colon cancer Neg Hx    Social History   Social History  . Marital status: Single    Spouse name: N/A  . Number of children: N/A  . Years of education: N/A   Occupational History  . dietary services [employer]   Social History Main Topics  . Smoking status: Current Every Day Smoker    Packs/day: 0.50    Years: 30.00    Types: Cigarettes  . Smokeless tobacco: Never Used     Comment: "Trying - hard to stop"  . Alcohol use No     Comment: " about 40 ounce beer per month."  down to a beer every 3 weeks  . Drug use: No     Comment: marijuana "laced with something"; today, stated "no" to question of illegal drug use 06/16/2016  . Sexual activity: Not on file   Other Topics Concern  . Not on file   Social History Narrative  . No narrative on file    Review of Systems: Constitutional: Negative Skin: Negative HENT: Negative  Eyes: + blindness in right eye and decreased vision in left Neck: Negative Respiratory: Negative Cardiovascular: Negative Gastrointestinal: Negative Genitourinary: + frequency Musculoskeletal: + for lower back pain and sciaticia Neurological: Negative  Hematological: Negative  Psychiatric/Behavioral: Negative    Objective:   Vitals:   09/21/16 1104  BP: 133/75  Pulse: 90  Resp: 14  Temp: 98 F (36.7 C)    Physical Exam: Constitutional: Patient appears well-developed and well-nourished. No distress.Obese HENT: Normocephalic, atraumatic, External right and left ear normal. Oropharynx is  clear and moist.  Eyes: Conjunctivae and EOM are normal. PERRLA, no scleral icterus. Neck: Normal ROM. Neck supple. No lymphadenopathy, No thyromegaly. CVS: RRR, S1/S2 +, no murmurs, no gallops, no rubs Pulmonary: Effort and breath sounds normal, no stridor, rhonchi, wheezes, rales.  Abdominal: Soft. Normoactive BS,, no distension, tenderness, rebound or guarding.  Musculoskeletal: Normal range of motion. No edema and no tenderness.  Neuro: Alert.Normal muscle tone coordination. Non-focal Skin: Skin is warm and dry. No rash noted. Not diaphoretic. No erythema. No pallor. Psychiatric: Normal mood and affect. Behavior, judgment, thought  content normal.  Lab Results  Component Value Date   WBC 9.2 04/27/2016   HGB 14.6 04/27/2016   HCT 44.0 04/27/2016   MCV 86.6 04/27/2016   PLT 210 04/27/2016   Lab Results  Component Value Date   CREATININE 3.47 (H) 06/16/2016   BUN 64 (H) 06/16/2016   NA 141 06/16/2016   K 3.6 06/16/2016   CL 99 06/16/2016   CO2 30 06/16/2016    Lab Results  Component Value Date   HGBA1C 7.3 09/21/2016   Lipid Panel     Component Value Date/Time   CHOL 163 06/16/2016 1147   TRIG 511 (H) 06/16/2016 1147   HDL 26 (L) 06/16/2016 1147   CHOLHDL 6.3 (H) 06/16/2016 1147   VLDL NOT CALC 06/16/2016 1147   LDLCALC NOT CALC 06/16/2016 1147        Assessment and plan:   1. Cough  - albuterol (PROVENTIL HFA;VENTOLIN HFA) 108 (90 Base) MCG/ACT inhaler; Inhale 2 puffs into the lungs every 4 (four) hours as needed for wheezing or shortness of breath.  Dispense: 1 Inhaler; Refill: 6 - fluticasone (FLONASE) 50 MCG/ACT nasal spray; Place 2 sprays into both nostrils daily.  Dispense: 16 g; Refill: 3  2. Type 2 diabetes mellitus with complication, unspecified long term insulin use status (HCC)  - COMPLETE METABOLIC PANEL WITH GFR - CBC with Differential - POCT glycosylated hemoglobin (Hb A1C) - insulin aspart (NOVOLOG) 100 UNIT/ML injection; Inject 0-60 Units into  the skin 3 (three) times daily with meals. Inject into the skin as directed based on sliding scale daily  Dispense: 20 mL; Refill: 0 - insulin glargine (LANTUS) 100 UNIT/ML injection; Inject 0.25-0.45 mLs (25-45 Units total) into the skin 2 (two) times daily. Inject 45 units into the skin daily in the morning. Inject 25 units into the skin daily at night  Dispense: 10 mL; Refill: 3 - Ambulatory referral to Podiatry   Return in about 3 months (around 12/20/2016).  The patient was given clear instructions to go to ER or return to medical center if symptoms don't improve, worsen or new problems develop. The patient verbalized understanding.    Micheline Chapman FNP  09/21/2016, 1:14 PM

## 2016-09-30 ENCOUNTER — Other Ambulatory Visit: Payer: Self-pay

## 2016-09-30 MED ORDER — HYDRALAZINE HCL 100 MG PO TABS: 100.0000 mg | ORAL_TABLET | Freq: Three times a day (TID) | ORAL | 3 refills | Status: DC

## 2016-10-05 ENCOUNTER — Telehealth: Payer: Self-pay

## 2016-10-05 ENCOUNTER — Other Ambulatory Visit: Payer: Self-pay | Admitting: Family Medicine

## 2016-10-05 MED ORDER — AMITRIPTYLINE HCL 50 MG PO TABS: 50.0000 mg | ORAL_TABLET | Freq: Every day | ORAL | 1 refills | Status: DC

## 2016-10-05 NOTE — Telephone Encounter (Signed)
Have sent to [facility] today, Thanks!

## 2016-10-06 DIAGNOSIS — N189 Chronic kidney disease, unspecified: Secondary | ICD-10-CM | POA: Diagnosis not present

## 2016-10-06 DIAGNOSIS — Z9989 Dependence on other enabling machines and devices: Secondary | ICD-10-CM | POA: Diagnosis not present

## 2016-10-06 DIAGNOSIS — E119 Type 2 diabetes mellitus without complications: Secondary | ICD-10-CM | POA: Diagnosis not present

## 2016-10-06 DIAGNOSIS — G4733 Obstructive sleep apnea (adult) (pediatric): Secondary | ICD-10-CM | POA: Diagnosis not present

## 2016-10-08 ENCOUNTER — Other Ambulatory Visit (HOSPITAL_COMMUNITY): Payer: Self-pay | Admitting: Surgery

## 2016-10-20 ENCOUNTER — Ambulatory Visit (INDEPENDENT_AMBULATORY_CARE_PROVIDER_SITE_OTHER): Payer: Medicare Other | Admitting: Sports Medicine

## 2016-10-20 ENCOUNTER — Encounter: Payer: Self-pay | Admitting: Sports Medicine

## 2016-10-20 DIAGNOSIS — E1140 Type 2 diabetes mellitus with diabetic neuropathy, unspecified: Secondary | ICD-10-CM | POA: Diagnosis not present

## 2016-10-20 DIAGNOSIS — M2142 Flat foot [pes planus] (acquired), left foot: Secondary | ICD-10-CM | POA: Diagnosis not present

## 2016-10-20 DIAGNOSIS — M79675 Pain in left toe(s): Secondary | ICD-10-CM | POA: Diagnosis not present

## 2016-10-20 DIAGNOSIS — L853 Xerosis cutis: Secondary | ICD-10-CM

## 2016-10-20 DIAGNOSIS — M2141 Flat foot [pes planus] (acquired), right foot: Secondary | ICD-10-CM

## 2016-10-20 DIAGNOSIS — H548 Legal blindness, as defined in USA: Secondary | ICD-10-CM

## 2016-10-20 DIAGNOSIS — E119 Type 2 diabetes mellitus without complications: Secondary | ICD-10-CM

## 2016-10-20 DIAGNOSIS — M79674 Pain in right toe(s): Secondary | ICD-10-CM

## 2016-10-20 DIAGNOSIS — B351 Tinea unguium: Secondary | ICD-10-CM

## 2016-10-20 MED ORDER — UREA 40 % EX OINT: TOPICAL_OINTMENT | CUTANEOUS | 2 refills | Status: DC | PRN

## 2016-10-20 NOTE — Patient Instructions (Signed)

## 2016-10-20 NOTE — Progress Notes (Signed)
Subjective: Jerry Cantrell is a 51 y.o. male patient with history of diabetes who presents to office today complaining of long, painful nails  while ambulating in shoes; unable to trim. Patient states that the glucose reading this morning was not recorded but last A1c was 7.3. Desires diabetic shoes. Patient denies any new changes in medication or new problems. Patient denies any new cramping, numbness, burning or tingling in the legs.  Patient is legally blind  Patient Active Problem List   Diagnosis Date Noted  . Chronic kidney disease 01/06/2016  . Obesity hypoventilation syndrome ([place])   . CKD (chronic kidney disease), stage III   . Type 2 diabetes, uncontrolled, with neuropathy ([place])   . Marijuana abuse   . Tobacco abuse   . Essential hypertension, malignant   . Chronic kidney disease, stage IV (severe) ([place])   . Legally blind 10/13/2015  . Hyperglycemia   . Accelerated hypertension   . Uncontrolled type 2 diabetes mellitus with ketoacidosis without coma ([place])   . Tobacco use disorder   . Encounter for imaging study to confirm orogastric (OG) tube placement   . Cocaine abuse   . Hypertensive emergency 06/06/2015  . Altered mental status   . Hyperkalemia   . Chronic respiratory failure ([place])   . Pulmonary nodule 05/22/2015  . Elevated troponin   . Chest pain at rest 05/13/2015  . Acute on chronic renal insufficiency ([place]) 01/18/2015  . Thrombocytopenia ([place]) 12/04/2013  . Diastolic heart failure secondary to hypertension ([place]) 11/30/2013  . Radiculopathy with lower extremity symptoms 11/28/2013  . Dyspnea 09/16/2013  . Depression 06/16/2013  . Proliferative diabetic retinopathy ([place]) 06/08/2013  . Diabetic macular edema of left eye, with cataract, associated with type 2 diabetes mellitus ([place]) 06/08/2013  . Meibomianitis 06/08/2013  . Erectile dysfunction associated with type 2 diabetes mellitus ([place]) 04/22/2013  . Decreased peripheral vision of left eye 04/12/2013  .  Plantar fasciitis of right foot 04/12/2013  . Cough 02/09/2013  . Obstructive sleep apnea 02/02/2013  . Diabetic peripheral neuropathy associated with type 2 diabetes mellitus ([place]) 12/27/2012  . Abnormality of gait 12/06/2012  . Family history of malignant neoplasm of gastrointestinal tract 10/10/2012  . Personal history of colonic polyps 10/10/2012  . Skin tag 08/18/2012  . Chronic low back pain 07/15/2012  . Preventative health care 06/02/2012  . Hyperlipemia 05/12/2012  . Hypertension 05/12/2012  . Urinary incontinence 05/12/2012  . Hemorrhoids 05/12/2012  . Blood per rectum 05/12/2012  . Meralgia paraesthetica 05/03/2012  . Lumbar spondylosis 05/03/2012  . Lumbar spinal stenosis 05/03/2012  . Insulin dependent type 2 diabetes mellitus, uncontrolled ([place]) 05/13/1999   Current Outpatient Prescriptions on File Prior to Visit  Medication Sig Dispense Refill  . albuterol (PROVENTIL HFA;VENTOLIN HFA) 108 (90 Base) MCG/ACT inhaler Inhale 2 puffs into the lungs every 4 (four) hours as needed for wheezing or shortness of breath. 1 Inhaler 6  . amitriptyline (ELAVIL) 50 MG tablet Take 1 tablet (50 mg total) by mouth at bedtime. 30 tablet 1  . amLODipine (NORVASC) 5 MG tablet Take 1 tablet (5 mg total) by mouth daily. 30 tablet 0  . aspirin 325 MG tablet Take 1 tablet (325 mg total) by mouth daily. 30 tablet 0  . atorvastatin (LIPITOR) 80 MG tablet Take 1 tablet (80 mg total) by mouth daily. 30 tablet 0  . carvedilol (COREG) 25 MG tablet Take 1 tablet (25 mg total) by mouth 2 (two) times daily with a meal. 28 tablet 0  .  cloNIDine (CATAPRES) 0.3 MG tablet Take 1 tablet (0.3 mg total) by mouth 3 (three) times daily. 90 tablet 0  . fluticasone (FLONASE) 50 MCG/ACT nasal spray Place 2 sprays into both nostrils daily. 16 g 3  . furosemide (LASIX) 80 MG tablet Take 2 tablets (160 mg total) by mouth 2 (two) times daily. 120 tablet 1  . gabapentin (NEURONTIN) 300 MG capsule Take 1 capsule (300 mg  total) by mouth 3 (three) times daily. 200 mg in the morning and 100 mg in the evening. (Patient taking differently: Take 300 mg by mouth 3 (three) times daily. ) 90 capsule 0  . glucose blood (ACCU-CHEK AVIVA) test strip Use as directed to check blood sugar qid before meals. 100 each 0  . hydrALAZINE (APRESOLINE) 100 MG tablet Take 1 tablet (100 mg total) by mouth 3 (three) times daily. 90 tablet 3  . hydrOXYzine (ATARAX/VISTARIL) 25 MG tablet Take 1 tablet (25 mg total) by mouth 3 (three) times daily as needed for itching. 30 tablet 0  . insulin aspart (NOVOLOG) 100 UNIT/ML injection Inject 0-60 Units into the skin 3 (three) times daily with meals. Inject into the skin as directed based on sliding scale daily 20 mL 0  . insulin glargine (LANTUS) 100 UNIT/ML injection Inject 0.25-0.45 mLs (25-45 Units total) into the skin 2 (two) times daily. Inject 45 units into the skin daily in the morning. Inject 25 units into the skin daily at night 10 mL 3  . metolazone (ZAROXOLYN) 5 MG tablet Take 1 tablet (5 mg total) by mouth daily. (Patient taking differently: Take 5 mg by mouth daily. Take this with second dose of lasix in the afternoon) 30 tablet 0  . Multiple Vitamins-Minerals (CENTRUM SILVER ADULT 50+) TABS Take 1 tablet by mouth daily.    . nicotine (NICODERM CQ) 21 mg/24hr patch Place 1 patch (21 mg total) onto the skin daily. 28 patch 4  . nicotine polacrilex (NICORETTE) 4 MG gum Take 1 each (4 mg total) by mouth as needed for smoking cessation. 30 each 0  . nitroGLYCERIN (NITROSTAT) 0.4 MG SL tablet Place 1 tablet (0.4 mg total) under the tongue every 5 (five) minutes as needed for chest pain. 25 tablet prn  . polyethylene glycol powder (GLYCOLAX/MIRALAX) powder Take 255 g by mouth daily. 255 g 3  . Polyvinyl Alcohol-Povidone (REFRESH OP) Place 2 drops into both eyes 3 (three) times daily as needed (dry eyes).    . Potassium Chloride ER 20 MEQ TBCR 20 mEq by Per post-pyloric tube route 2 (two) times  daily. (Patient taking differently: Take 20 mEq by mouth 2 (two) times daily. ) 60 tablet 1  . verapamil (VERELAN PM) 120 MG 24 hr capsule Take 120 mg by mouth at bedtime.     No current facility-administered medications on file prior to visit.    No Known Allergies  Recent Results (from the past 2160 hour(s))  COMPLETE METABOLIC PANEL WITH GFR     Status: Abnormal   Collection Time: 09/21/16 11:38 AM  Result Value Ref Range   Sodium 133 (L) 135 - 146 mmol/L   Potassium 3.4 (L) 3.5 - 5.3 mmol/L   Chloride 96 (L) 98 - 110 mmol/L   CO2 24 20 - 31 mmol/L   Glucose, Bld 104 (H) 65 - 99 mg/dL   BUN 67 (H) 7 - 25 mg/dL   Creat 3.60 (H) 0.70 - 1.33 mg/dL    Comment:   For patients > or = 50 years  of age: The upper reference limit for Creatinine is approximately 13% higher for people identified as African-American.      Total Bilirubin 0.3 0.2 - 1.2 mg/dL   Alkaline Phosphatase 84 40 - 115 U/L   AST 15 10 - 35 U/L   ALT 12 9 - 46 U/L   Total Protein 6.2 6.1 - 8.1 g/dL   Albumin 3.6 3.6 - 5.1 g/dL   Calcium 9.0 8.6 - 10.3 mg/dL   GFR, Est African American 21 (L) >=60 mL/min   GFR, Est Non African American 18 (L) >=60 mL/min  CBC with Differential     Status: Abnormal   Collection Time: 09/21/16 11:38 AM  Result Value Ref Range   WBC 8.6 3.8 - 10.8 K/uL   RBC 4.87 4.20 - 5.80 MIL/uL   Hemoglobin 13.5 13.2 - 17.1 g/dL   HCT 42.5 38.5 - 50.0 %   MCV 87.3 80.0 - 100.0 fL   MCH 27.7 27.0 - 33.0 pg   MCHC 31.8 (L) 32.0 - 36.0 g/dL   RDW 14.6 11.0 - 15.0 %   Platelets 181 140 - 400 K/uL   MPV 10.9 7.5 - 12.5 fL   Neutro Abs 6,192 1,500 - 7,800 cells/uL   Lymphs Abs 1,720 850 - 3,900 cells/uL   Monocytes Absolute 430 200 - 950 cells/uL   Eosinophils Absolute 258 15 - 500 cells/uL   Basophils Absolute 0 0 - 200 cells/uL   Neutrophils Relative % 72 %   Lymphocytes Relative 20 %   Monocytes Relative 5 %   Eosinophils Relative 3 %   Basophils Relative 0 %   Smear Review Criteria  for review not met   POCT glycosylated hemoglobin (Hb A1C)     Status: Abnormal   Collection Time: 09/21/16 12:12 PM  Result Value Ref Range   Hemoglobin A1C 7.3     Objective: General: Patient is awake, alert, and oriented x 3 and in no acute distress.  Integument: Skin is warm, dry and supple bilateral. Nails are tender, long, thickened and  dystrophic with subungual debris, consistent with onychomycosis, 1-5 bilateral. No signs of infection. Dry skin and heel callus. No open lesions or preulcerative lesions present bilateral. Remaining integument unremarkable.  Vasculature:  Dorsalis Pedis pulse 1/4 bilateral. Posterior Tibial pulse  1/4 bilateral.  Capillary fill time <3 sec 1-5 bilateral. Scant hair growth to the level of the digits. Temperature gradient within normal limits. No varicosities present bilateral. No edema present bilateral.   Neurology: The patient has intact sensation measured with a 5.07/10g Semmes Weinstein Monofilament at all pedal sites bilateral . Vibratory sensation diminished bilateral with tuning fork. No Babinski sign present bilateral.   Musculoskeletal: Pes planus pedal deformities noted bilateral. Muscular strength 5/5 in all lower extremity muscular groups bilateral without pain on range of motion . No tenderness with calf compression bilateral.  Assessment and Plan: Problem List Items Addressed This Visit    None    Visit Diagnoses    Pain due to onychomycosis of toenails of both feet    -  Primary   Type 2 diabetes mellitus with diabetic neuropathy, unspecified long term insulin use status (HCC)       Pes planus of both feet       Legal blindness       Comprehensive diabetic foot examination, type 2 DM, encounter for [facility])          -Examined patient. -Discussed and educated patient on diabetic foot care, especially  with  regards to the vascular, neurological and musculoskeletal systems.  -Stressed the importance of good glycemic control and the  detriment of not  controlling glucose levels in relation to the foot. -Mechanically debrided all nails 1-5 bilateral using sterile nail nipper and filed with dremel without incident  -Rx Urea dry skin  -[employer] diabetic shoe order form was completed; office to contact primary care for approval / certification;  Office to arrange shoe fitting and dispensing. -Answered all patient questions -Patient to return  in 2.5 months for at risk foot care -Patient advised to call the office if any problems or questions arise in the meantime.  Landis Martins, DPM

## 2016-10-23 ENCOUNTER — Other Ambulatory Visit: Payer: Self-pay | Admitting: Family Medicine

## 2016-10-27 ENCOUNTER — Telehealth: Payer: Self-pay | Admitting: *Deleted

## 2016-10-27 ENCOUNTER — Ambulatory Visit (HOSPITAL_COMMUNITY)
Admission: RE | Admit: 2016-10-27 | Discharge: 2016-10-27 | Disposition: A | Discharge status: Home / Self Care | Payer: Medicare Other | Source: Ambulatory Visit | Attending: Surgery | Admitting: Surgery

## 2016-10-27 ENCOUNTER — Other Ambulatory Visit: Payer: Self-pay

## 2016-10-27 DIAGNOSIS — I440 Atrioventricular block, first degree: Secondary | ICD-10-CM | POA: Diagnosis not present

## 2016-10-27 DIAGNOSIS — Z01810 Encounter for preprocedural cardiovascular examination: Secondary | ICD-10-CM | POA: Insufficient documentation

## 2016-10-27 DIAGNOSIS — I517 Cardiomegaly: Secondary | ICD-10-CM | POA: Insufficient documentation

## 2016-10-27 DIAGNOSIS — R918 Other nonspecific abnormal finding of lung field: Secondary | ICD-10-CM | POA: Insufficient documentation

## 2016-10-27 DIAGNOSIS — K222 Esophageal obstruction: Secondary | ICD-10-CM | POA: Insufficient documentation

## 2016-10-27 DIAGNOSIS — I878 Other specified disorders of veins: Secondary | ICD-10-CM | POA: Diagnosis not present

## 2016-10-27 DIAGNOSIS — K224 Dyskinesia of esophagus: Secondary | ICD-10-CM | POA: Diagnosis not present

## 2016-10-27 DIAGNOSIS — I454 Nonspecific intraventricular block: Secondary | ICD-10-CM | POA: Insufficient documentation

## 2016-10-27 NOTE — Telephone Encounter (Addendum)
Pt states he needs prior authorization for a cream sent to [facility]. I spoke with pt and offered [id number], pt states has limited income and would prefer the medication be covered by his insurance. I told him I would get the paperwork faxed right after I finished it. Faxed required Request for Redetermination of Medicare Prescription Drug Denial, and chart notes to [employer]. 10/30/2016-Faxed completed Request for Reconsideration of Medicare Prescription Drug Denial to [employer].11/02/2016-[facility] was calling concerning pt's Diabetic Shoe paper work.11/16/2016-Unable to leave message informing pt the request for Urea 40% cream was denied as was the appeal.

## 2016-10-29 ENCOUNTER — Encounter: Payer: Medicare Other | Attending: Surgery | Admitting: Skilled Nursing Facility1

## 2016-10-29 ENCOUNTER — Other Ambulatory Visit: Payer: Self-pay | Admitting: Family Medicine

## 2016-10-29 DIAGNOSIS — I509 Heart failure, unspecified: Secondary | ICD-10-CM | POA: Diagnosis not present

## 2016-10-29 DIAGNOSIS — N189 Chronic kidney disease, unspecified: Secondary | ICD-10-CM | POA: Diagnosis not present

## 2016-10-29 DIAGNOSIS — E118 Type 2 diabetes mellitus with unspecified complications: Secondary | ICD-10-CM

## 2016-10-29 DIAGNOSIS — E1122 Type 2 diabetes mellitus with diabetic chronic kidney disease: Secondary | ICD-10-CM | POA: Diagnosis not present

## 2016-10-29 DIAGNOSIS — I130 Hypertensive heart and chronic kidney disease with heart failure and stage 1 through stage 4 chronic kidney disease, or unspecified chronic kidney disease: Secondary | ICD-10-CM | POA: Insufficient documentation

## 2016-10-29 DIAGNOSIS — Z713 Dietary counseling and surveillance: Secondary | ICD-10-CM | POA: Diagnosis not present

## 2016-10-29 DIAGNOSIS — E119 Type 2 diabetes mellitus without complications: Secondary | ICD-10-CM

## 2016-10-29 DIAGNOSIS — Z6836 Body mass index (BMI) 36.0-36.9, adult: Secondary | ICD-10-CM | POA: Insufficient documentation
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
## 2016-10-29 NOTE — Patient Instructions (Signed)
Wall push ups

## 2016-10-30 DIAGNOSIS — L728 Other follicular cysts of the skin and subcutaneous tissue: Secondary | ICD-10-CM | POA: Diagnosis not present

## 2016-10-30 DIAGNOSIS — S70922A Unspecified superficial injury of left thigh, initial encounter: Secondary | ICD-10-CM | POA: Diagnosis not present

## 2016-10-30 DIAGNOSIS — S70921A Unspecified superficial injury of right thigh, initial encounter: Secondary | ICD-10-CM | POA: Diagnosis not present

## 2016-11-02 ENCOUNTER — Telehealth: Payer: Self-pay

## 2016-11-02 NOTE — Telephone Encounter (Signed)
Abigail Butts can you check on this?

## 2016-11-02 NOTE — Telephone Encounter (Signed)
LM for patient to call back.

## 2016-11-02 NOTE — Telephone Encounter (Signed)
Patient is requesting a referral [facility] for Diabetic shoes and to [facility] for a cyst on his face. We have already done a referral to [facility] for nail clipping but he says he needs another one for the shoes. If Vaughan Basta is in agreement I have left a message with the nurse to call us. Regarding the cyst on his face, we referred him to [facility] which in turn has sent him to [facility]. They need a referral faxed to see him since we are his PCP. Please advise thank you

## 2016-11-03 ENCOUNTER — Ambulatory Visit (INDEPENDENT_AMBULATORY_CARE_PROVIDER_SITE_OTHER)
Admission: RE | Admit: 2016-11-03 | Discharge: 2016-11-03 | Disposition: A | Discharge status: Home / Self Care | Payer: Medicare Other | Source: Ambulatory Visit | Attending: Adult Health | Admitting: Adult Health

## 2016-11-03 DIAGNOSIS — R918 Other nonspecific abnormal finding of lung field: Secondary | ICD-10-CM | POA: Diagnosis not present

## 2016-11-03 DIAGNOSIS — R911 Solitary pulmonary nodule: Secondary | ICD-10-CM | POA: Diagnosis not present

## 2016-11-05 NOTE — Telephone Encounter (Signed)
Vaughan Basta, please advise if patient can receive these 2 referrals and we will send. Thank you!

## 2016-11-11 ENCOUNTER — Other Ambulatory Visit: Payer: Self-pay | Admitting: Family Medicine

## 2016-11-11 DIAGNOSIS — L989 Disorder of the skin and subcutaneous tissue, unspecified: Secondary | ICD-10-CM

## 2016-11-12 DIAGNOSIS — H35033 Hypertensive retinopathy, bilateral: Secondary | ICD-10-CM | POA: Diagnosis not present

## 2016-11-12 DIAGNOSIS — H2511 Age-related nuclear cataract, right eye: Secondary | ICD-10-CM | POA: Diagnosis not present

## 2016-11-12 DIAGNOSIS — E11319 Type 2 diabetes mellitus with unspecified diabetic retinopathy without macular edema: Secondary | ICD-10-CM | POA: Diagnosis not present

## 2016-11-12 DIAGNOSIS — Z961 Presence of intraocular lens: Secondary | ICD-10-CM | POA: Diagnosis not present

## 2016-11-17 ENCOUNTER — Ambulatory Visit: Payer: Self-pay | Admitting: Psychiatry

## 2016-12-01 ENCOUNTER — Encounter: Payer: Medicare Other | Attending: Surgery | Admitting: Skilled Nursing Facility1

## 2016-12-01 ENCOUNTER — Encounter: Payer: Self-pay | Admitting: Skilled Nursing Facility1

## 2016-12-01 DIAGNOSIS — I130 Hypertensive heart and chronic kidney disease with heart failure and stage 1 through stage 4 chronic kidney disease, or unspecified chronic kidney disease: Secondary | ICD-10-CM | POA: Insufficient documentation

## 2016-12-01 DIAGNOSIS — N183 Chronic kidney disease, stage 3 unspecified: Secondary | ICD-10-CM

## 2016-12-01 DIAGNOSIS — N189 Chronic kidney disease, unspecified: Secondary | ICD-10-CM | POA: Diagnosis not present

## 2016-12-01 DIAGNOSIS — Z713 Dietary counseling and surveillance: Secondary | ICD-10-CM | POA: Insufficient documentation

## 2016-12-01 DIAGNOSIS — E1165 Type 2 diabetes mellitus with hyperglycemia: Secondary | ICD-10-CM

## 2016-12-01 DIAGNOSIS — Z6836 Body mass index (BMI) 36.0-36.9, adult: Secondary | ICD-10-CM | POA: Diagnosis not present

## 2016-12-01 DIAGNOSIS — Z794 Long term (current) use of insulin: Secondary | ICD-10-CM

## 2016-12-01 DIAGNOSIS — E1122 Type 2 diabetes mellitus with diabetic chronic kidney disease: Secondary | ICD-10-CM | POA: Diagnosis not present

## 2016-12-01 DIAGNOSIS — IMO0002 Reserved for concepts with insufficient information to code with codable children: Secondary | ICD-10-CM

## 2016-12-01 DIAGNOSIS — I509 Heart failure, unspecified: Secondary | ICD-10-CM | POA: Insufficient documentation
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
## 2016-12-01 NOTE — Patient Instructions (Signed)
-  15 grams or more of protein and 5 grams or less of carbohydrates  - A multivitamin AND calcium   Starting 2 weeks before surgery:  No fruit No bread No applesauce No corn No peas No potatoes No sweet potatoes

## 2016-12-06 ENCOUNTER — Encounter: Payer: Self-pay | Admitting: Pulmonary Disease

## 2016-12-08 ENCOUNTER — Ambulatory Visit (INDEPENDENT_AMBULATORY_CARE_PROVIDER_SITE_OTHER): Payer: Medicare Other | Admitting: Pulmonary Disease

## 2016-12-08 ENCOUNTER — Encounter: Payer: Self-pay | Admitting: Pulmonary Disease

## 2016-12-08 VITALS — BP 122/80 | HR 75 | Ht 70.0 in | Wt 267.0 lb

## 2016-12-08 DIAGNOSIS — J9611 Chronic respiratory failure with hypoxia: Secondary | ICD-10-CM | POA: Diagnosis not present

## 2016-12-08 DIAGNOSIS — G4733 Obstructive sleep apnea (adult) (pediatric): Secondary | ICD-10-CM

## 2016-12-08 NOTE — Progress Notes (Signed)
   Subjective:    Patient ID: Jerry Cantrell, male    DOB: April 24, 1966, 51 y.o.   MRN: [id number]  HPI  51 year old male smoker with polysubstance abuse (cocaine) and severe OSA on nocturnal BiPAP, chronic hypoxic respiratory failure on O2 He has chronic diastolic heart failure    12/08/2016  Chief Complaint  Patient presents with  . Follow-up    After CT scan. States breathing has been great since last visit.     He is maintained on nocturnal BiPAP. He has done well over the past year and has avoided repeated hospital admissions. He states that he is not abusing cocaine anymore. He continues to smoke cigarettes  He would like supplies for his BiPAP he complains about inability to get distilled water on a limited income. He has worsening renal failure and was advised weight loss before he could be listed for renal transplant, he has a fistula placed already (webb)  He has undergoing evaluation for bariatric surgery  He reports good compliance with his CPAP machine but download shows very poor compliance, nights used, can be up to 7 hours but averages low due to several missed nights  His leg swelling is controlled on Lasix CT chest was reviewed which shows stable nodules for more than a year ago so the calcified and probably benign  Significant tests/ events  CT chest 05/2015 -evidence of old granulomatous disease with calcified right hilar lymph nodes, bilateral subcentimeter nodules , some new compared to 02/2010  PSG 01/2015- severe OSA, AHI 99/hour, not corrected by CPAP required BiPAP 25/21 with a full face mask  PFTs 10/2015 >> no obsn, mod restriction  10/2015  2-D echo nml LVEF, grade 1 diastolic dysfunction Review of Systems  neg for any significant sore throat, dysphagia, itching, sneezing, nasal congestion or excess/ purulent secretions, fever, chills, sweats, unintended wt loss, pleuritic or exertional cp, hempoptysis, orthopnea pnd or change in chronic leg swelling.  Also denies presyncope, palpitations, heartburn, abdominal pain, nausea, vomiting, diarrhea or change in bowel or urinary habits, dysuria,hematuria, rash, arthralgias, visual complaints, headache, numbness weakness or ataxia.     Objective:   Physical Exam  Gen. Pleasant, obese, in no distress ENT - no lesions, no post nasal drip Neck: No JVD, no thyromegaly, no carotid bruits Lungs: no use of accessory muscles, no dullness to percussion, decreased without rales or rhonchi  Cardiovascular: Rhythm regular, heart sounds  normal, no murmurs or gallops, no peripheral edema Musculoskeletal: No deformities, no cyanosis or clubbing , no tremors       Assessment & Plan:

## 2016-12-08 NOTE — Patient Instructions (Signed)
BiPAP supplies will be renewed for a year  Check overnight nocturnal oximetry- on CPAP/room air  if oxygen level stays good then we will discontinue oxygen  Good luck with weight loss program

## 2016-12-08 NOTE — Assessment & Plan Note (Signed)
BiPAP supplies will be renewed for a year Download shows poor compliance  Weight loss encouraged, compliance with goal of at least 4-6 hrs every night is the expectation. Advised against medications with sedative side effects Cautioned against driving when sleepy - understanding that sleepiness will vary on a day to day basis

## 2016-12-08 NOTE — Assessment & Plan Note (Signed)
Check overnight nocturnal oximetry- on CPAP/room air  if oxygen level stays good then we will discontinue oxygen

## 2016-12-10 ENCOUNTER — Encounter: Payer: Self-pay | Admitting: Pulmonary Disease

## 2016-12-10 DIAGNOSIS — I509 Heart failure, unspecified: Secondary | ICD-10-CM | POA: Diagnosis not present

## 2016-12-10 DIAGNOSIS — R0602 Shortness of breath: Secondary | ICD-10-CM | POA: Diagnosis not present

## 2016-12-10 DIAGNOSIS — G4733 Obstructive sleep apnea (adult) (pediatric): Secondary | ICD-10-CM | POA: Diagnosis not present

## 2016-12-11 DIAGNOSIS — I509 Heart failure, unspecified: Secondary | ICD-10-CM | POA: Diagnosis not present

## 2016-12-11 DIAGNOSIS — R0602 Shortness of breath: Secondary | ICD-10-CM | POA: Diagnosis not present

## 2016-12-11 DIAGNOSIS — G4733 Obstructive sleep apnea (adult) (pediatric): Secondary | ICD-10-CM | POA: Diagnosis not present

## 2016-12-21 DIAGNOSIS — R7989 Other specified abnormal findings of blood chemistry: Secondary | ICD-10-CM | POA: Diagnosis not present

## 2016-12-21 DIAGNOSIS — F17200 Nicotine dependence, unspecified, uncomplicated: Secondary | ICD-10-CM | POA: Diagnosis not present

## 2016-12-21 DIAGNOSIS — N183 Chronic kidney disease, stage 3 (moderate): Secondary | ICD-10-CM | POA: Diagnosis not present

## 2016-12-21 DIAGNOSIS — E1122 Type 2 diabetes mellitus with diabetic chronic kidney disease: Secondary | ICD-10-CM | POA: Diagnosis not present

## 2016-12-21 DIAGNOSIS — N2581 Secondary hyperparathyroidism of renal origin: Secondary | ICD-10-CM | POA: Diagnosis not present

## 2016-12-22 ENCOUNTER — Telehealth: Payer: Self-pay | Admitting: Pulmonary Disease

## 2016-12-22 ENCOUNTER — Ambulatory Visit (INDEPENDENT_AMBULATORY_CARE_PROVIDER_SITE_OTHER): Payer: Medicare Other | Admitting: Family Medicine

## 2016-12-22 ENCOUNTER — Encounter: Payer: Self-pay | Admitting: Family Medicine

## 2016-12-22 VITALS — BP 146/84 | HR 78 | Temp 98.6°F | Resp 16 | Ht 70.0 in | Wt 269.0 lb

## 2016-12-22 DIAGNOSIS — G629 Polyneuropathy, unspecified: Secondary | ICD-10-CM

## 2016-12-22 DIAGNOSIS — Z794 Long term (current) use of insulin: Secondary | ICD-10-CM

## 2016-12-22 DIAGNOSIS — E0821 Diabetes mellitus due to underlying condition with diabetic nephropathy: Secondary | ICD-10-CM

## 2016-12-22 DIAGNOSIS — R059 Cough, unspecified: Secondary | ICD-10-CM

## 2016-12-22 DIAGNOSIS — M2740 Unspecified cyst of jaw: Secondary | ICD-10-CM | POA: Diagnosis not present

## 2016-12-22 DIAGNOSIS — R05 Cough: Secondary | ICD-10-CM

## 2016-12-22 LAB — POCT URINALYSIS DIP (DEVICE)
BILIRUBIN URINE: NEGATIVE
GLUCOSE, UA: 100 mg/dL — AB
Ketones, ur: NEGATIVE mg/dL
Nitrite: NEGATIVE
Protein, ur: >=300 mg/dL — AB
Specific Gravity, Urine: 1.015 (ref 1.005–1.030)
UROBILINOGEN UA: 0.2 mg/dL (ref 0.0–1.0)
pH: 5.5 (ref 5.0–8.0)

## 2016-12-22 LAB — POCT GLYCOSYLATED HEMOGLOBIN (HGB A1C): HEMOGLOBIN A1C: 7.3

## 2016-12-22 MED ORDER — HYDRALAZINE HCL 100 MG PO TABS: 100.0000 mg | ORAL_TABLET | Freq: Three times a day (TID) | ORAL | 3 refills | Status: DC

## 2016-12-22 MED ORDER — ATORVASTATIN CALCIUM 80 MG PO TABS: ORAL_TABLET | ORAL | 3 refills | Status: DC

## 2016-12-22 MED ORDER — GABAPENTIN 300 MG PO CAPS: 300.0000 mg | ORAL_CAPSULE | Freq: Three times a day (TID) | ORAL | 0 refills | Status: DC

## 2016-12-22 MED ORDER — AMITRIPTYLINE HCL 50 MG PO TABS: 50.0000 mg | ORAL_TABLET | Freq: Every day | ORAL | 1 refills | Status: DC

## 2016-12-22 MED ORDER — AMLODIPINE BESYLATE 5 MG PO TABS: 5.0000 mg | ORAL_TABLET | Freq: Every day | ORAL | 0 refills | Status: DC

## 2016-12-22 MED ORDER — CLONIDINE HCL 0.3 MG PO TABS: 0.3000 mg | ORAL_TABLET | Freq: Three times a day (TID) | ORAL | 0 refills | Status: DC

## 2016-12-22 MED ORDER — HYDROXYZINE HCL 25 MG PO TABS: 25.0000 mg | ORAL_TABLET | Freq: Three times a day (TID) | ORAL | 0 refills | Status: DC | PRN

## 2016-12-22 MED ORDER — CETIRIZINE HCL 10 MG PO TABS: 10.0000 mg | ORAL_TABLET | Freq: Every day | ORAL | 11 refills | Status: DC

## 2016-12-22 MED ORDER — INSULIN ASPART 100 UNIT/ML FLEXPEN: PEN_INJECTOR | SUBCUTANEOUS | 3 refills | Status: DC

## 2016-12-22 MED ORDER — INSULIN GLARGINE 100 UNIT/ML ~~LOC~~ SOLN: 25.0000 [IU] | Freq: Two times a day (BID) | SUBCUTANEOUS | 3 refills | Status: DC

## 2016-12-22 MED ORDER — FUROSEMIDE 80 MG PO TABS: 160.0000 mg | ORAL_TABLET | Freq: Two times a day (BID) | ORAL | 1 refills | Status: DC

## 2016-12-22 MED ORDER — GLUCOSE BLOOD VI STRP: ORAL_STRIP | 0 refills | Status: DC

## 2016-12-22 MED ORDER — NITROGLYCERIN 0.4 MG SL SUBL: 0.4000 mg | SUBLINGUAL_TABLET | SUBLINGUAL | 99 refills | Status: DC | PRN

## 2016-12-22 MED ORDER — ALBUTEROL SULFATE HFA 108 (90 BASE) MCG/ACT IN AERS: 2.0000 | INHALATION_SPRAY | RESPIRATORY_TRACT | 6 refills | Status: DC | PRN

## 2016-12-22 MED ORDER — METOLAZONE 5 MG PO TABS: 5.0000 mg | ORAL_TABLET | Freq: Every day | ORAL | 0 refills | Status: DC

## 2016-12-22 MED ORDER — ASPIRIN 325 MG PO TABS: 325.0000 mg | ORAL_TABLET | Freq: Every day | ORAL | 0 refills | Status: DC

## 2016-12-22 MED ORDER — FLUTICASONE PROPIONATE 50 MCG/ACT NA SUSP
2.0000 | Freq: Every day | NASAL | 3 refills | Status: DC
Start: 2016-12-22 — End: 2017-06-13

## 2016-12-22 MED ORDER — CARVEDILOL 25 MG PO TABS: ORAL_TABLET | ORAL | 3 refills | Status: DC

## 2016-12-22 MED ORDER — TETRAHYDROZOLINE-ZN SULFATE 0.05-0.25 % OP SOLN: 2.0000 [drp] | Freq: Three times a day (TID) | OPHTHALMIC | 1 refills | Status: DC | PRN

## 2016-12-22 MED ORDER — VERAPAMIL HCL ER 120 MG PO TBCR: 120.0000 mg | EXTENDED_RELEASE_TABLET | Freq: Every day | ORAL | 3 refills | Status: DC

## 2016-12-22 NOTE — Telephone Encounter (Signed)
Spoke with patient regarding ONO status. Patient stated that he had the finger probe all night. He also stated that [facility] should have more than one hour of data.   Will call [facility] to follow up on test results.

## 2016-12-22 NOTE — Telephone Encounter (Signed)
Received ONO results from [facility], per RA, data only shows 1hr of use. He needs a full night of data.

## 2016-12-22 NOTE — Progress Notes (Addendum)
Patient ID: Jerry Cantrell, male    DOB: 1966-01-23, 51 y.o.   MRN: [id number]  PCP: Molli Barrows, FNP  Chief Complaint  Patient presents with  . Follow-up    DIABETES  . Referral    [employer] FOR DIABETIC SHOES,ENT IN [place] FOR CYST ON LEFT SIDE OF JAW  . Sore Throat  . Medication Refill    All meds    Subjective:  HPI Jerry Cantrell is a 51 y.o. male presents for a routine diabetes follow-up, follow-up on prior prescription for diabetic shoes, and cyst on left outer jaw. Medical history: Obesity, T2DM, hypertension, legally blind, CKD V, OSA  Diabetes Reports consistent  home monitoring of blood glucose. Reports on average, his morning readings run between 98-145 and mid 200's at its highest throughout the day. He administers 25-30 Lantus at bedtime and he adheres to a sliding scale regimen for his short acting insulin. He is schedule to have gastro-bypass surgery in September in hopes of decreasing weigh in order to obtain a listing on the renal transplant list. He reports losing over 20 lbs over the last 12 months. He is very active walking 1-2 miles per day and his foot neuropathy is most trouble symptom. He is under the care of [facility] and was under the impression they had forwarded paperwork to have his diabetic shoes ordered. He is requesting today he is making a second request to have diabetic shoes ordered. He has been on high dose Gabapentin for some time and reports medication helps with neuropathy.  End-Stage Renal Disease  He is under the care of [facility] for management of end stage renal disease. He still has urinary function and reports during his office visit yesterday, he was told his renal function numbers were the same no improved nor worsening. He continues to smoke although reports that he has significantly cut back on the number of cigarettes smoked per day.  Hypertension  Reports no home monitoring of blood pressure. Denies chest  pain, headaches, or dizziness. He has chronic shortness of breath. He takes Coreg and Amlodipine without adverse side effects.  Cough and Sore Throat  Complains of cough with sore throat x 3 days. Positive for post nasal drip. Reports chronic seasonal allergies although he not taking chronic antihistamine therapy. Remains afebrile. Denies any other associated symptoms.  Cyst on Left Jaw  Reports that he was previously seen at [facility] dermatology for a cystic lesion on left medial jaw.  They completed their portion of the procedure and advised him to follow-up with ENT for further evaluation. Reports non-painful mass.   Social History   Social History  . Marital status: Single    Spouse name: N/A  . Number of children: N/A  . Years of education: N/A   Occupational History  . dietary services [employer]   Social History Main Topics  . Smoking status: Current Every Day Smoker    Packs/day: 0.50    Years: 30.00    Types: Cigarettes  . Smokeless tobacco: Never Used     Comment: "Trying - hard to stop"  . Alcohol use No     Comment: " about 40 ounce beer per month."  down to a beer every 3 weeks  . Drug use: No     Comment: marijuana "laced with something"; today, stated "no" to question of illegal drug use 06/16/2016  . Sexual activity: Not on file   Other Topics Concern  . Not on file  Social History Narrative  . No narrative on file    Family History  Problem Relation Age of Onset  . Prostate cancer Father   . Alcohol abuse Father   . Emphysema Father     smoked  . Colon cancer Neg Hx    Review of Systems See HPI  Patient Active Problem List   Diagnosis Date Noted  . Chronic kidney disease 01/06/2016  . CKD (chronic kidney disease), stage III   . Type 2 diabetes, uncontrolled, with neuropathy ([place])   . Marijuana abuse   . Tobacco abuse   . Essential hypertension, malignant   . Chronic kidney disease, stage IV (severe) ([place])   . Legally blind 10/13/2015   . Accelerated hypertension   . Uncontrolled type 2 diabetes mellitus with ketoacidosis without coma ([place])   . Cocaine abuse   . Hypertensive emergency 06/06/2015  . Chronic respiratory failure ([place])   . Pulmonary nodule 05/22/2015  . Thrombocytopenia ([place]) 12/04/2013  . Diastolic heart failure secondary to hypertension ([place]) 11/30/2013  . Radiculopathy with lower extremity symptoms 11/28/2013  . Depression 06/16/2013  . Proliferative diabetic retinopathy ([place]) 06/08/2013  . Diabetic macular edema of left eye, with cataract, associated with type 2 diabetes mellitus ([place]) 06/08/2013  . Meibomianitis 06/08/2013  . Erectile dysfunction associated with type 2 diabetes mellitus ([place]) 04/22/2013  . Decreased peripheral vision of left eye 04/12/2013  . Plantar fasciitis of right foot 04/12/2013  . Obstructive sleep apnea 02/02/2013  . Diabetic peripheral neuropathy associated with type 2 diabetes mellitus ([place]) 12/27/2012  . Abnormality of gait 12/06/2012  . Family history of malignant neoplasm of gastrointestinal tract 10/10/2012  . Personal history of colonic polyps 10/10/2012  . Skin tag 08/18/2012  . Chronic low back pain 07/15/2012  . Preventative health care 06/02/2012  . Hyperlipemia 05/12/2012  . Hypertension 05/12/2012  . Urinary incontinence 05/12/2012  . Hemorrhoids 05/12/2012  . Blood per rectum 05/12/2012  . Meralgia paraesthetica 05/03/2012  . Lumbar spondylosis 05/03/2012  . Lumbar spinal stenosis 05/03/2012  . Insulin dependent type 2 diabetes mellitus, uncontrolled ([place]) 05/13/1999    No Known Allergies  Prior to Admission medications   Medication Sig Start Date End Date Taking? Authorizing Provider  albuterol (PROVENTIL HFA;VENTOLIN HFA) 108 (90 Base) MCG/ACT inhaler Inhale 2 puffs into the lungs every 4 (four) hours as needed for wheezing or shortness of breath. 09/21/16  Yes Micheline Chapman, NP  amitriptyline (ELAVIL) 50 MG tablet Take 1 tablet (50 mg total) by  mouth at bedtime. 10/05/16  Yes Micheline Chapman, NP  amLODipine (NORVASC) 5 MG tablet Take 1 tablet (5 mg total) by mouth daily. 10/22/15  Yes Verlee Monte, MD  aspirin 325 MG tablet Take 1 tablet (325 mg total) by mouth daily. 02/05/15  Yes Tiffany Carlota Raspberry, PA-C  atorvastatin (LIPITOR) 80 MG tablet TAKE ONE (1) TABLET BY MOUTH EVERY DAY 11/11/16  Yes Micheline Chapman, NP  carvedilol (COREG) 25 MG tablet TAKE ONE (1) TABLET BY MOUTH TWO (2) TIMES DAILY WITH FOOD 11/11/16  Yes Micheline Chapman, NP  cloNIDine (CATAPRES) 0.3 MG tablet Take 1 tablet (0.3 mg total) by mouth 3 (three) times daily. 10/22/15  Yes Verlee Monte, MD  fluticasone (FLONASE) 50 MCG/ACT nasal spray Place 2 sprays into both nostrils daily. 09/21/16  Yes Micheline Chapman, NP  furosemide (LASIX) 80 MG tablet Take 2 tablets (160 mg total) by mouth 2 (two) times daily. 10/22/15  Yes Verlee Monte, MD  gabapentin (NEURONTIN) 300  MG capsule Take 1 capsule (300 mg total) by mouth 3 (three) times daily. 200 mg in the morning and 100 mg in the evening. Patient taking differently: Take 300 mg by mouth 3 (three) times daily.  10/22/15  Yes Verlee Monte, MD  glucose blood (ACCU-CHEK AVIVA) test strip Use as directed to check blood sugar qid before meals. 09/08/16  Yes Micheline Chapman, NP  hydrALAZINE (APRESOLINE) 100 MG tablet Take 1 tablet (100 mg total) by mouth 3 (three) times daily. 09/30/16  Yes Micheline Chapman, NP  hydrOXYzine (ATARAX/VISTARIL) 25 MG tablet Take 1 tablet (25 mg total) by mouth 3 (three) times daily as needed for itching. 11/08/15  Yes Fay Records, MD  insulin glargine (LANTUS) 100 UNIT/ML injection Inject 0.25-0.45 mLs (25-45 Units total) into the skin 2 (two) times daily. Inject 45 units into the skin daily in the morning. Inject 25 units into the skin daily at night 09/21/16  Yes Micheline Chapman, NP  metolazone (ZAROXOLYN) 5 MG tablet Take 1 tablet (5 mg total) by mouth daily. Patient taking differently: Take 5 mg by mouth  daily. Take this with second dose of lasix in the afternoon 10/22/15  Yes Verlee Monte, MD  Multiple Vitamins-Minerals (CENTRUM SILVER ADULT 50+) TABS Take 1 tablet by mouth daily.   Yes Historical Provider, MD  nicotine (NICODERM CQ) 21 mg/24hr patch Place 1 patch (21 mg total) onto the skin daily. 04/23/16  Yes Rigoberto Noel, MD  nicotine polacrilex (NICORETTE) 4 MG gum Take 1 each (4 mg total) by mouth as needed for smoking cessation. 04/23/16  Yes Rigoberto Noel, MD  nitroGLYCERIN (NITROSTAT) 0.4 MG SL tablet Place 1 tablet (0.4 mg total) under the tongue every 5 (five) minutes as needed for chest pain. 07/18/15  Yes Brittainy M Simmons, PA-C  NOVOLOG FLEXPEN 100 UNIT/ML FlexPen INJECT 0-60 UNITS INTO THE SKIN 3 TIMES DAILY WITH MEALS.  INJECT INTO THE SKIN AS DIRECTED BASED ON SLIDING SCALE 10/29/16  Yes Micheline Chapman, NP  polyethylene glycol powder (GLYCOLAX/MIRALAX) powder Take 255 g by mouth daily. 05/29/16  Yes [name], MD  Polyvinyl Alcohol-Povidone (REFRESH OP) Place 2 drops into both eyes 3 (three) times daily as needed (dry eyes).   Yes Historical Provider, MD  Potassium Chloride ER 20 MEQ TBCR 20 mEq by Per post-pyloric tube route 2 (two) times daily. Patient taking differently: Take 20 mEq by mouth 2 (two) times daily.  10/22/15  Yes Verlee Monte, MD  urea (GORDONS UREA) 40 % ointment Apply topically as needed. 10/20/16  Yes Titorya Stover, DPM  verapamil (CALAN-SR) 120 MG CR tablet TAKE ONE TABLET BY MOUTH EVERY NIGHT AT BEDTIME 10/23/16  Yes Micheline Chapman, NP  verapamil (VERELAN PM) 120 MG 24 hr capsule Take 120 mg by mouth at bedtime.   Yes Historical Provider, MD    Past Medical, Surgical Family and Social History reviewed and updated.    Objective:   Today's Vitals   12/22/16 1140  BP: (!) 150/96  Pulse: 78  Resp: 16  Temp: 98.6 F (37 C)  TempSrc: Oral  SpO2: 95%  Weight: 269 lb (122 kg)  Height: 5\' 10"  (1.778 m)    Wt Readings from Last 3 Encounters:   12/22/16 269 lb (122 kg)  12/08/16 267 lb (121.1 kg)  12/01/16 264 lb 12.8 oz (120.1 kg)    Physical Exam  Constitutional: He is oriented to person, place, and time. He appears well-developed and well-nourished.  HENT:  Head: Normocephalic and atraumatic.  Right Ear: External ear normal.  Left Ear: External ear normal.  Nose: Nose normal.  Mouth/Throat: Oropharynx is clear and moist.  Eyes: Conjunctivae are normal. Pupils are equal, round, and reactive to light.  Neck: Normal range of motion. Neck supple. No thyromegaly present.  Cardiovascular: Normal rate, regular rhythm, normal heart sounds and intact distal pulses.   Pulmonary/Chest: Effort normal. He has decreased breath sounds in the right upper field, the right middle field, the right lower field, the left upper field, the left middle field and the left lower field.  Abdominal: Soft. Bowel sounds are normal.  Musculoskeletal: Normal range of motion.  Neurological: He is alert and oriented to person, place, and time.  Skin: Skin is warm and dry.  Psychiatric: He has a normal mood and affect. His behavior is normal. Judgment and thought content normal.    Assessment & Plan:  1. Diabetes mellitus due to underlying condition with diabetic nephropathy, with long-term current use of insulin (HCC)-controlled - POCT glycosylated hemoglobin (Hb A1C)-7.3 -Continue current short acting and Lantus regimen   2. Neuropathy -Gabapentin 300 mg, 3 times per daily   3. Cyst of jaw - Ambulatory referral to ENT  4. Cough - fluticasone (FLONASE) 50 MCG/ACT nasal spray; Place 2 sprays into both nostrils daily.  Dispense: 16 g; Refill: 3 - albuterol (PROVENTIL HFA;VENTOLIN HFA) 108 (90 Base) MCG/ACT inhaler; Inhale 2 puffs into the lungs every 4 (four) hours as needed for wheezing or shortness of breath.  Dispense: 1 Inhaler; Refill: 6 -Cetirizine 10 mg daily.  I will obtain labs from [facility] from your recent office visit. I will  fax over an order for diabetic shoes.  RTC: 3 months for Diabetes and Hypertension follow-up   A total of 40 minutes spent, greater than 50 % of this time was spent reviewing prior medical history, reviewing medications and indications of treatment, prior labs and diagnostic tests, discussing current plan of treatment, health promotion, and goals of treatment.   Carroll Sage. Kenton Kingfisher, MSN, [facility] [address] Cower [place], [place] [address] [phone]   Evaluation and management procedures were performed by the Advanced Practitioner under my supervision and collaboration. I have reviewed the Advanced Practitioner's note and chart, and I agree with the management and plan.  Angelica Chessman, MD, [place], [place], [place], [place] and [place], [place]   12/30/2016, 1:34 PM

## 2016-12-29 ENCOUNTER — Ambulatory Visit (INDEPENDENT_AMBULATORY_CARE_PROVIDER_SITE_OTHER): Payer: Medicare Other | Admitting: Sports Medicine

## 2016-12-29 ENCOUNTER — Telehealth: Payer: Self-pay | Admitting: Family Medicine

## 2016-12-29 DIAGNOSIS — M2142 Flat foot [pes planus] (acquired), left foot: Secondary | ICD-10-CM

## 2016-12-29 DIAGNOSIS — B359 Dermatophytosis, unspecified: Secondary | ICD-10-CM

## 2016-12-29 DIAGNOSIS — B351 Tinea unguium: Secondary | ICD-10-CM | POA: Diagnosis not present

## 2016-12-29 DIAGNOSIS — M79674 Pain in right toe(s): Secondary | ICD-10-CM

## 2016-12-29 DIAGNOSIS — E119 Type 2 diabetes mellitus without complications: Secondary | ICD-10-CM | POA: Diagnosis not present

## 2016-12-29 DIAGNOSIS — H548 Legal blindness, as defined in USA: Secondary | ICD-10-CM

## 2016-12-29 DIAGNOSIS — M2141 Flat foot [pes planus] (acquired), right foot: Secondary | ICD-10-CM

## 2016-12-29 DIAGNOSIS — M79675 Pain in left toe(s): Secondary | ICD-10-CM

## 2016-12-29 DIAGNOSIS — L853 Xerosis cutis: Secondary | ICD-10-CM

## 2016-12-29 MED ORDER — CLOTRIMAZOLE-BETAMETHASONE 1-0.05 % EX CREA: 1.0000 "application " | TOPICAL_CREAM | Freq: Two times a day (BID) | CUTANEOUS | 2 refills | Status: DC

## 2016-12-29 NOTE — Progress Notes (Addendum)
Subjective: Irma Delancey is a 51 y.o. male patient with history of diabetes who returns to office today complaining of long, painful nails  while ambulating in shoes; unable to trim. Patient states that the glucose reading this morning was not recorded. Desires an update on the status of his diabetic shoes. Patient denies any new changes in medication or new problems. Patient denies any new cramping, numbness, burning or tingling in the legs.  Patient is legally blind  Patient Active Problem List   Diagnosis Date Noted  . Chronic kidney disease 01/06/2016  . CKD (chronic kidney disease), stage III   . Type 2 diabetes, uncontrolled, with neuropathy ([place])   . Marijuana abuse   . Tobacco abuse   . Essential hypertension, malignant   . Chronic kidney disease, stage IV (severe) ([place])   . Legally blind 10/13/2015  . Accelerated hypertension   . Uncontrolled type 2 diabetes mellitus with ketoacidosis without coma ([place])   . Cocaine abuse   . Hypertensive emergency 06/06/2015  . Chronic respiratory failure ([place])   . Pulmonary nodule 05/22/2015  . Thrombocytopenia ([place]) 12/04/2013  . Diastolic heart failure secondary to hypertension ([place]) 11/30/2013  . Radiculopathy with lower extremity symptoms 11/28/2013  . Depression 06/16/2013  . Proliferative diabetic retinopathy ([place]) 06/08/2013  . Diabetic macular edema of left eye, with cataract, associated with type 2 diabetes mellitus ([place]) 06/08/2013  . Meibomianitis 06/08/2013  . Erectile dysfunction associated with type 2 diabetes mellitus ([place]) 04/22/2013  . Decreased peripheral vision of left eye 04/12/2013  . Plantar fasciitis of right foot 04/12/2013  . Obstructive sleep apnea 02/02/2013  . Diabetic peripheral neuropathy associated with type 2 diabetes mellitus ([place]) 12/27/2012  . Abnormality of gait 12/06/2012  . Family history of malignant neoplasm of gastrointestinal tract 10/10/2012  . Personal history of colonic polyps 10/10/2012  .  Skin tag 08/18/2012  . Chronic low back pain 07/15/2012  . Preventative health care 06/02/2012  . Hyperlipemia 05/12/2012  . Hypertension 05/12/2012  . Urinary incontinence 05/12/2012  . Hemorrhoids 05/12/2012  . Blood per rectum 05/12/2012  . Meralgia paraesthetica 05/03/2012  . Lumbar spondylosis 05/03/2012  . Lumbar spinal stenosis 05/03/2012  . Insulin dependent type 2 diabetes mellitus, uncontrolled ([place]) 05/13/1999   Current Outpatient Prescriptions on File Prior to Visit  Medication Sig Dispense Refill  . albuterol (PROVENTIL HFA;VENTOLIN HFA) 108 (90 Base) MCG/ACT inhaler Inhale 2 puffs into the lungs every 4 (four) hours as needed for wheezing or shortness of breath. 1 Inhaler 6  . amitriptyline (ELAVIL) 50 MG tablet Take 1 tablet (50 mg total) by mouth at bedtime. 30 tablet 1  . amLODipine (NORVASC) 5 MG tablet Take 1 tablet (5 mg total) by mouth daily. 30 tablet 0  . aspirin 325 MG tablet Take 1 tablet (325 mg total) by mouth daily. 30 tablet 0  . atorvastatin (LIPITOR) 80 MG tablet TAKE ONE (1) TABLET BY MOUTH EVERY DAY 30 tablet 3  . carvedilol (COREG) 25 MG tablet TAKE ONE (1) TABLET BY MOUTH TWO (2) TIMES DAILY WITH FOOD 60 tablet 3  . cetirizine (ZYRTEC) 10 MG tablet Take 1 tablet (10 mg total) by mouth daily. 30 tablet 11  . cloNIDine (CATAPRES) 0.3 MG tablet Take 1 tablet (0.3 mg total) by mouth 3 (three) times daily. 90 tablet 0  . fluticasone (FLONASE) 50 MCG/ACT nasal spray Place 2 sprays into both nostrils daily. 16 g 3  . furosemide (LASIX) 80 MG tablet Take 2 tablets (160 mg total) by  mouth 2 (two) times daily. 120 tablet 1  . gabapentin (NEURONTIN) 300 MG capsule Take 1 capsule (300 mg total) by mouth 3 (three) times daily. 200 mg in the morning and 100 mg in the evening. 90 capsule 0  . glucose blood (ACCU-CHEK AVIVA) test strip Use as directed to check blood sugar qid before meals. 100 each 0  . hydrALAZINE (APRESOLINE) 100 MG tablet Take 1 tablet (100 mg total)  by mouth 3 (three) times daily. 90 tablet 3  . hydrOXYzine (ATARAX/VISTARIL) 25 MG tablet Take 1 tablet (25 mg total) by mouth 3 (three) times daily as needed for itching. 30 tablet 0  . insulin aspart (NOVOLOG FLEXPEN) 100 UNIT/ML FlexPen INJECT 0-60 UNITS INTO THE SKIN 3 TIMES DAILY WITH MEALS.  INJECT INTO THE SKIN AS DIRECTED BASED ON SLIDING SCALE 30 mL 3  . insulin glargine (LANTUS) 100 UNIT/ML injection Inject 0.25-0.45 mLs (25-45 Units total) into the skin 2 (two) times daily. Inject 45 units into the skin daily in the morning. Inject 25 units into the skin daily at night 10 mL 3  . metolazone (ZAROXOLYN) 5 MG tablet Take 1 tablet (5 mg total) by mouth daily. 30 tablet 0  . Multiple Vitamins-Minerals (CENTRUM SILVER ADULT 50+) TABS Take 1 tablet by mouth daily.    . nicotine (NICODERM CQ) 21 mg/24hr patch Place 1 patch (21 mg total) onto the skin daily. 28 patch 4  . nicotine polacrilex (NICORETTE) 4 MG gum Take 1 each (4 mg total) by mouth as needed for smoking cessation. 30 each 0  . nitroGLYCERIN (NITROSTAT) 0.4 MG SL tablet Place 1 tablet (0.4 mg total) under the tongue every 5 (five) minutes as needed for chest pain. 25 tablet prn  . polyethylene glycol powder (GLYCOLAX/MIRALAX) powder Take 255 g by mouth daily. 255 g 3  . Polyvinyl Alcohol-Povidone (REFRESH OP) Place 2 drops into both eyes 3 (three) times daily as needed (dry eyes).    . Potassium Chloride ER 20 MEQ TBCR 20 mEq by Per post-pyloric tube route 2 (two) times daily. (Patient taking differently: Take 20 mEq by mouth 2 (two) times daily. ) 60 tablet 1  . tetrahydrozoline-zinc (EYE DROPS ALLERGY RELIEF) 0.05-0.25 % ophthalmic solution Place 2 drops into both eyes 3 (three) times daily as needed. 15 mL 1  . urea (GORDONS UREA) 40 % ointment Apply topically as needed. 30 g 2  . verapamil (CALAN-SR) 120 MG CR tablet Take 1 tablet (120 mg total) by mouth at bedtime. 30 tablet 3   No current facility-administered medications on  file prior to visit.    No Known Allergies  Recent Results (from the past 2160 hour(s))  POCT urinalysis dip (device)     Status: Abnormal   Collection Time: 12/22/16 12:10 PM  Result Value Ref Range   Glucose, UA 100 (A) NEGATIVE mg/dL   Bilirubin Urine NEGATIVE NEGATIVE   Ketones, ur NEGATIVE NEGATIVE mg/dL   Specific Gravity, Urine 1.015 1.005 - 1.030   Hgb urine dipstick TRACE (A) NEGATIVE   pH 5.5 5.0 - 8.0   Protein, ur >=300 (A) NEGATIVE mg/dL   Urobilinogen, UA 0.2 0.0 - 1.0 mg/dL   Nitrite NEGATIVE NEGATIVE   Leukocytes, UA TRACE (A) NEGATIVE    Comment: Biochemical Testing Only. Please order routine urinalysis from main lab if confirmatory testing is needed.  POCT glycosylated hemoglobin (Hb A1C)     Status: Abnormal   Collection Time: 12/22/16  3:35 PM  Result Value Ref Range  Hemoglobin A1C 7.3     Objective: General: Patient is awake, alert, and oriented x 3 and in no acute distress.  Integument: Skin is warm, dry and supple bilateral. Nails are tender, long, thickened and dystrophic with subungual debris, consistent with onychomycosis, 1-5 bilateral. No signs of infection. Patches of tinea and Dry skin and heel callus. No open lesions or preulcerative lesions present bilateral. Remaining integument unremarkable.  Vasculature:  Dorsalis Pedis pulse 1/4 bilateral. Posterior Tibial pulse  1/4 bilateral. Capillary fill time <3 sec 1-5 bilateral. Scant hair growth to the level of the digits.Temperature gradient within normal limits. No varicosities present bilateral. No edema present bilateral.   Neurology: The patient has intact sensation measured with a 5.07/10g Semmes Weinstein Monofilament at all pedal sites bilateral . Vibratory sensation diminished bilateral with tuning fork. No Babinski sign present bilateral.   Musculoskeletal: Pes planus pedal deformities noted bilateral. Muscular strength 5/5 in all lower extremity muscular groups bilateral without pain on range  of motion . No tenderness with calf compression bilateral.  Assessment and Plan: Problem List Items Addressed This Visit    None    Visit Diagnoses    Pain due to onychomycosis of toenails of both feet    -  Primary   Relevant Medications   clotrimazole-betamethasone (LOTRISONE) cream   Pes planus of both feet       Legal blindness       Comprehensive diabetic foot examination, type 2 DM, encounter for ([place])       Dry skin       Tinea       Relevant Medications   clotrimazole-betamethasone (LOTRISONE) cream      -Examined patient. -Discussed and educated patient on diabetic foot care, especially with  regards to the vascular, neurological and musculoskeletal systems.  -Stressed the importance of good glycemic control and the detriment of not  controlling glucose levels in relation to the foot. -Mechanically debrided all nails 1-5 bilateral using sterile nail nipper and filed with dremel without incident  -Rx Clotrimazole cream to use daily  -Recommend patient to use [place] Healthy Feet or Skin emollients at home daily  -Patient's PCP contacted me regarding diabetic shoes and states that they will go through another vendor to get diabetic shoes -Answered all patient questions -Patient to return  in 3 months for at risk foot care -Patient advised to call the office if any problems or questions arise in the meantime.  Landis Martins, DPM

## 2016-12-29 NOTE — Telephone Encounter (Signed)
Please follow-up on diabetic shoe order and notify patient of status.  Thanks

## 2016-12-30 ENCOUNTER — Telehealth: Payer: Self-pay | Admitting: Family Medicine

## 2016-12-30 NOTE — Telephone Encounter (Signed)
Please contact Mr. Drummonds to let him know we are working on his diabetic shoes paperwork is awaiting being signed off by our Market researcher. As for the clonidine, this is for his blood pressure. We do not have any other options due to his end stage kidney disease. For dry mouth I recommend over the counter Biotene mouthwash and sucking on sugar free hard candy.   Carroll Sage. Kenton Kingfisher, MSN, [facility] [address] [place], [place] [address] [phone]

## 2016-12-30 NOTE — Telephone Encounter (Signed)
Reprint progress notes from Dr. Cannon Kettle 10/20/16 and 12/29/2016 Also print my note from 12/22/16 for Dr. Doreene Burke has added an amendment under my signature. Please refax the pack to upper level with this revised information.  Please follow-up with them tomorrow or Friday to ensure that this satisfies their requirement for the diabetic shoes.  Thanks again,   [place]. Kenton Kingfisher, MSN, [facility] [address] [place], [place] [address] [phone]

## 2016-12-30 NOTE — Telephone Encounter (Signed)
Spoke with Jerry Cantrell and he will try the mouth wash and will wait to hear about the shoes

## 2016-12-30 NOTE — Telephone Encounter (Signed)
Spoke with Jerry Cantrell over at [facility] and she states that the notes need to be done over with just Dr. Doreene Burke name on it. Also patient states that he can't take the clonidine because it drys his mouth out and would like something else.

## 2016-12-31 ENCOUNTER — Encounter: Payer: Self-pay | Admitting: Skilled Nursing Facility1

## 2016-12-31 ENCOUNTER — Encounter: Payer: Medicare Other | Attending: Surgery | Admitting: Skilled Nursing Facility1

## 2016-12-31 DIAGNOSIS — N189 Chronic kidney disease, unspecified: Secondary | ICD-10-CM | POA: Insufficient documentation

## 2016-12-31 DIAGNOSIS — I130 Hypertensive heart and chronic kidney disease with heart failure and stage 1 through stage 4 chronic kidney disease, or unspecified chronic kidney disease: Secondary | ICD-10-CM | POA: Insufficient documentation

## 2016-12-31 DIAGNOSIS — I509 Heart failure, unspecified: Secondary | ICD-10-CM | POA: Diagnosis not present

## 2016-12-31 DIAGNOSIS — Z6836 Body mass index (BMI) 36.0-36.9, adult: Secondary | ICD-10-CM | POA: Insufficient documentation

## 2016-12-31 DIAGNOSIS — E1122 Type 2 diabetes mellitus with diabetic chronic kidney disease: Secondary | ICD-10-CM | POA: Insufficient documentation

## 2016-12-31 DIAGNOSIS — Z713 Dietary counseling and surveillance: Secondary | ICD-10-CM | POA: Insufficient documentation
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
## 2017-01-04 ENCOUNTER — Telehealth: Payer: Self-pay | Admitting: *Deleted

## 2017-01-04 NOTE — Telephone Encounter (Signed)
Patient called today to report that he has had a "knot on his wrist" for the past few days. No injury known. He states that he is afebrile. Dr. Donnetta Hutching last saw him on 04-28-16 for follow up on left radiocephalic AV fistula creation by Dr. Donnetta Hutching on 02/06/2016.  Patient is still not on HD and says that his next appt at [facility] is not for another 4 months. Patient is a diabetic and legally blind. I instructed him to see his PCP to determine if this knot was superficial or something else that may need antibiotic coverage. He sees Molli Barrows, FNP at the [facility]. Patient stated that " he doesn't care what it is or if it gets infected, he isn't going to the doctor about it". I have again asked him to see his PCP or Dr. Justin Mend about this area on his arm. He refused again

## 2017-01-19 ENCOUNTER — Other Ambulatory Visit: Payer: Self-pay | Admitting: Family Medicine

## 2017-01-27 ENCOUNTER — Other Ambulatory Visit: Payer: Self-pay | Admitting: Family Medicine

## 2017-02-01 ENCOUNTER — Encounter: Payer: Self-pay | Admitting: Skilled Nursing Facility1

## 2017-02-01 ENCOUNTER — Encounter: Payer: Medicare HMO | Attending: Surgery | Admitting: Skilled Nursing Facility1

## 2017-02-01 DIAGNOSIS — E1122 Type 2 diabetes mellitus with diabetic chronic kidney disease: Secondary | ICD-10-CM | POA: Insufficient documentation

## 2017-02-01 DIAGNOSIS — Z6836 Body mass index (BMI) 36.0-36.9, adult: Secondary | ICD-10-CM | POA: Insufficient documentation

## 2017-02-01 DIAGNOSIS — I509 Heart failure, unspecified: Secondary | ICD-10-CM | POA: Insufficient documentation

## 2017-02-01 DIAGNOSIS — Z713 Dietary counseling and surveillance: Secondary | ICD-10-CM | POA: Insufficient documentation

## 2017-02-01 DIAGNOSIS — I130 Hypertensive heart and chronic kidney disease with heart failure and stage 1 through stage 4 chronic kidney disease, or unspecified chronic kidney disease: Secondary | ICD-10-CM | POA: Diagnosis not present

## 2017-02-01 DIAGNOSIS — N189 Chronic kidney disease, unspecified: Secondary | ICD-10-CM | POA: Insufficient documentation

## 2017-02-01 DIAGNOSIS — N184 Chronic kidney disease, stage 4 (severe): Secondary | ICD-10-CM

## 2017-02-01 NOTE — Progress Notes (Signed)
   Assessment:   3rd SWL Appointment. Sleeve Gastrectomy  Pt states he has not had dialysis but has the fistula placed and only gets 16 dollars a month in food stamps. PT NEEDS LARGE PRINT MATERIALS.  Pt arrives having lost about 4 pounds. Pt states he is Possibly going through the surgery process faster due to low income. Pt states he is doing some bicep Curling. Pt states he Can join a gym for cheaper.  Pt states he has had No low blood sugars, averaging 129.   Start Wt at NDES: 279.10 Wt: 270 BMI:38.77  MEDICATIONS: See List   DIETARY INTAKE:  24-hr recall:  B ( AM): [place] suasage and wheat toast and egg---oatmeal and egg----toast and banana  Snk ( AM):  L ( PM): oatmeal-----chicken and veggies---manwich---ribs  Snk ( PM):  D ( PM): chicken and veggies and succotash---peanutbutter and jelly----bacon and tomato with salad Snk ( PM):  Beverages: water, coffee  Usual physical activity: walking and pushups  Diet to Follow: 1800 calories 200 g carbohydrates 135 g protein 50 g fat   Nutritional Diagnosis:  [place]-3.3 Overweight/obesity related to past poor dietary habits and physical inactivity as evidenced by patient w/ recent sleeve gastrectomy surgery following dietary guidelines for continued weight loss.    Intervention:  Nutrition counseling for upcoming Bariatric Surgery. Goals: -15 grams or more of protein and 5 grams or less of carbohydrates - A multivitamin AND calcium  Starting 2 weeks before surgery: No fruit No bread No applesauce No corn No peas No potatoes No sweet potatoes Barriers to learning/adherence to lifestyle change: financial constraints   Demonstrated degree of understanding via:  Teach Back   Monitoring/Evaluation:  Dietary intake, exercise, and body weight prn.

## 2017-02-03 DIAGNOSIS — L732 Hidradenitis suppurativa: Secondary | ICD-10-CM | POA: Diagnosis not present

## 2017-02-06 NOTE — Progress Notes (Signed)
Cardiology Office Note   Date:  02/08/2017   ID:  Jerry Cantrell, DOB 04/19/1966, MRN [id number]  PCP:  Forrest Moron, MD  Cardiologist:   Dorris Carnes, MD       History of Present Illness: Jerry Cantrell is a 51 y.o. male with a history of CAP and resp failue , sepsis , ARF, HTN, OSA, DM  Diastolic CHF, HL.  I saw him in March 2017   Sine he was in clinic last he says his breathing is OK  No CP         Current Meds  Medication Sig  . albuterol (PROVENTIL HFA;VENTOLIN HFA) 108 (90 Base) MCG/ACT inhaler Inhale 2 puffs into the lungs every 4 (four) hours as needed for wheezing or shortness of breath.  Marland Kitchen amitriptyline (ELAVIL) 50 MG tablet Take 1 tablet (50 mg total) by mouth at bedtime.  Marland Kitchen amLODipine (NORVASC) 5 MG tablet Take 1 tablet (5 mg total) by mouth daily.  Marland Kitchen aspirin 325 MG tablet Take 1 tablet (325 mg total) by mouth daily.  Marland Kitchen atorvastatin (LIPITOR) 80 MG tablet TAKE ONE (1) TABLET BY MOUTH EVERY DAY  . carvedilol (COREG) 25 MG tablet TAKE ONE (1) TABLET BY MOUTH TWO (2) TIMES DAILY WITH FOOD  . cetirizine (ZYRTEC) 10 MG tablet Take 1 tablet (10 mg total) by mouth daily.  . cloNIDine (CATAPRES) 0.3 MG tablet TAKE ONE (1) TABLET BY MOUTH 3 TIMES DAILY  . clotrimazole-betamethasone (LOTRISONE) cream Apply 1 application topically 2 (two) times daily.  . fluticasone (FLONASE) 50 MCG/ACT nasal spray Place 2 sprays into both nostrils daily.  . furosemide (LASIX) 80 MG tablet Take 2 tablets (160 mg total) by mouth 2 (two) times daily.  Marland Kitchen gabapentin (NEURONTIN) 300 MG capsule Take 1 capsule (300 mg total) by mouth 3 (three) times daily. 200 mg in the morning and 100 mg in the evening.  Marland Kitchen glucose blood (ACCU-CHEK AVIVA) test strip Use as directed to check blood sugar qid before meals.  . hydrALAZINE (APRESOLINE) 100 MG tablet Take 1 tablet (100 mg total) by mouth 3 (three) times daily.  . hydrOXYzine (ATARAX/VISTARIL) 25 MG tablet TAKE ONE (1) TABLET BY MOUTH 3 TIMES DAILY AS  NEEDED FOR ITCHING  . insulin aspart (NOVOLOG FLEXPEN) 100 UNIT/ML FlexPen INJECT 0-60 UNITS INTO THE SKIN 3 TIMES DAILY WITH MEALS.  INJECT INTO THE SKIN AS DIRECTED BASED ON SLIDING SCALE  . insulin glargine (LANTUS) 100 UNIT/ML injection Inject 0.25-0.45 mLs (25-45 Units total) into the skin 2 (two) times daily. Inject 45 units into the skin daily in the morning. Inject 25 units into the skin daily at night  . metolazone (ZAROXOLYN) 5 MG tablet Take 1 tablet (5 mg total) by mouth daily.  . Multiple Vitamins-Minerals (CENTRUM SILVER ADULT 50+) TABS Take 1 tablet by mouth daily.  . nicotine (NICODERM CQ) 21 mg/24hr patch Place 1 patch (21 mg total) onto the skin daily.  . nicotine polacrilex (NICORETTE) 4 MG gum Take 1 each (4 mg total) by mouth as needed for smoking cessation.  . nitroGLYCERIN (NITROSTAT) 0.4 MG SL tablet Place 1 tablet (0.4 mg total) under the tongue every 5 (five) minutes as needed for chest pain.  . polyethylene glycol powder (GLYCOLAX/MIRALAX) powder Take 255 g by mouth daily.  . Polyvinyl Alcohol-Povidone (REFRESH OP) Place 2 drops into both eyes 3 (three) times daily as needed (dry eyes).  . Potassium Chloride ER 20 MEQ TBCR 20 mEq by Per post-pyloric tube route 2 (  two) times daily. (Patient taking differently: Take 20 mEq by mouth 2 (two) times daily. )  . QC ENTERIC ASPIRIN 325 MG EC tablet TAKE ONE (1) TABLET BY MOUTH EVERY DAY  . tetrahydrozoline-zinc (EYE DROPS ALLERGY RELIEF) 0.05-0.25 % ophthalmic solution Place 2 drops into both eyes 3 (three) times daily as needed.  . urea (GORDONS UREA) 40 % ointment Apply topically as needed.  . verapamil (CALAN-SR) 120 MG CR tablet Take 1 tablet (120 mg total) by mouth at bedtime.     Allergies:   Patient has no known allergies.   Past Medical History:  Diagnosis Date  . Acute diastolic heart failure ([place])   . CHF (congestive heart failure) ([place])   . Diabetes mellitus with nephropathy ([place]) 05/12/2012   Dr. Justin Mend follows    . Hemorrhoids 05/12/2012  . Hyperlipidemia   . Hypertension   . Lumbar spinal stenosis 05/03/2012   Mild with only right L4 nerve root encroachment, no neurogenic claudication   . Lumbar spondylosis 05/03/2012  . Meralgia paraesthetica 05/03/2012  . Meralgia paraesthetica 05/03/2012   On Lyrica which does improve pain.   Marland Kitchen Neuropathy in diabetes ([place]) 05/12/2012  . Obesity   . Pneumonia   . Retinopathy   . Sleep apnea    uses cpap  . Substance abuse   . Urinary incontinence 05/12/2012   Since the start of Sept. 2013     Past Surgical History:  Procedure Laterality Date  . ABDOMINAL SURGERY     Abscess I&D 2/2 infected hair  . AV FISTULA PLACEMENT Left 02/06/2016   Procedure: LEFT ARM RADIOCEPHALIC ARTERIOVENOUS (AV) FISTULA CREATION;  Surgeon: Rosetta Posner, MD;  Location: [facility];  Service: Vascular;  Laterality: Left;  . COLONOSCOPY W/ POLYPECTOMY     pt to bring records  . COLONOSCOPY WITH PROPOFOL N/A 06/05/2016   Procedure: COLONOSCOPY WITH PROPOFOL;  Surgeon: Doran Stabler, MD;  Location: [facility];  Service: Gastroenterology;  Laterality: N/A;  . TONSILLECTOMY       Social History:  The patient  reports that he has been smoking Cigarettes.  He has a 15.00 pack-year smoking history. He has never used smokeless tobacco. He reports that he does not drink alcohol or use drugs.   Family History:  The patient's family history includes Alcohol abuse in his father; Emphysema in his father; Prostate cancer in his father.    ROS:  Please see the history of present illness. All other systems are reviewed and  Negative to the above problem except as noted.    PHYSICAL EXAM: VS:  BP (!) 152/88   Pulse 67   Ht 5\' 10"  (1.778 m)   Wt 120.6 kg (265 lb 12.8 oz)   SpO2 97%   BMI 38.14 kg/m   GEN: Well nourished, well developed, in no acute distress  HEENT: normal  Neck: no JVD, carotid bruits, or masses Cardiac: RRR; no murmurs, rubs, or gallops,no edema  Respiratory:  clear to  auscultation bilaterally, normal work of breathing GI: soft, nontender, nondistended, + BS  No hepatomegaly  MS: no deformity Moving all extremities   Skin: warm and dry, no rash Neuro:  Strength and sensation are intact Psych: euthymic mood, full affect   EKG:  EKG is not ordered today.   Lipid Panel    Component Value Date/Time   CHOL 163 06/16/2016 1147   TRIG 511 (H) 06/16/2016 1147   HDL 26 (L) 06/16/2016 1147   CHOLHDL 6.3 (H) 06/16/2016 1147  VLDL NOT CALC 06/16/2016 1147   LDLCALC NOT CALC 06/16/2016 1147   LDLDIRECT 99 09/16/2010 2130      Wt Readings from Last 3 Encounters:  02/08/17 120.6 kg (265 lb 12.8 oz)  02/01/17 122.6 kg (270 lb 3.2 oz)  12/31/16 124.3 kg (274 lb 1.6 oz)      ASSESSMENT AND PLAN:  1  HTN  BP is not optimal  I would increae amlodipine to 10 mg per day  Keep on Verapamil for now  If improves will pull back He has stopped clonidine on his own  Makes his mouth dry  Keep on other mieds   2  Chronic diastolic CHF  Volume status apppears OK    3  HL  Continue statin  Watch diet    Increaese activity Cut back on ASA to 81 mg perday    Current medicines are reviewed at length with the patient today.  The patient does not have concerns regarding medicines.  Signed, Dorris Carnes, MD  02/08/2017 9:45 AM    [address] [place], [place], [place]  [address] Phone: [phone]; Fax: [phone]

## 2017-02-08 ENCOUNTER — Encounter: Payer: Self-pay | Admitting: Internal Medicine

## 2017-02-08 ENCOUNTER — Ambulatory Visit (INDEPENDENT_AMBULATORY_CARE_PROVIDER_SITE_OTHER): Payer: Medicare HMO | Admitting: Internal Medicine

## 2017-02-08 VITALS — BP 152/88 | HR 67 | Ht 70.0 in | Wt 265.8 lb

## 2017-02-08 DIAGNOSIS — I5032 Chronic diastolic (congestive) heart failure: Secondary | ICD-10-CM | POA: Diagnosis not present

## 2017-02-08 DIAGNOSIS — I10 Essential (primary) hypertension: Secondary | ICD-10-CM

## 2017-02-08 DIAGNOSIS — E782 Mixed hyperlipidemia: Secondary | ICD-10-CM | POA: Diagnosis not present

## 2017-02-08 LAB — LIPID PANEL
Chol/HDL Ratio: 6.6 ratio — ABNORMAL HIGH (ref 0.0–5.0)
Cholesterol, Total: 166 mg/dL (ref 100–199)
HDL: 25 mg/dL — ABNORMAL LOW (ref >39)
Triglycerides: 513 mg/dL — ABNORMAL HIGH (ref 0–149)

## 2017-02-08 MED ORDER — AMLODIPINE BESYLATE 10 MG PO TABS: 10.0000 mg | ORAL_TABLET | Freq: Every day | ORAL | 3 refills | Status: DC

## 2017-02-08 MED ORDER — ASPIRIN EC 81 MG PO TBEC: 81.0000 mg | DELAYED_RELEASE_TABLET | Freq: Every day | ORAL | 3 refills | Status: DC

## 2017-02-08 NOTE — Patient Instructions (Addendum)
Your physician has recommenchronic diastlded you make the following change in your medication:  1.) increase amlodipine to 10 mg daily 2.) stop clonidine 3.) change aspirin 325 mg to 81 mg once daily  Your physician wants you to follow-up in: 1 year with Dr. Harrington Challenger.  You will receive a reminder letter in the mail two months in advance. If you don't receive a letter, please call our office to schedule the follow-up appointment.

## 2017-02-10 ENCOUNTER — Ambulatory Visit (INDEPENDENT_AMBULATORY_CARE_PROVIDER_SITE_OTHER): Payer: Medicare HMO | Admitting: Family Medicine

## 2017-02-10 ENCOUNTER — Telehealth: Payer: Self-pay

## 2017-02-10 ENCOUNTER — Encounter: Payer: Self-pay | Admitting: Family Medicine

## 2017-02-10 VITALS — BP 145/89 | HR 64 | Temp 98.0°F | Resp 18 | Ht 70.5 in | Wt 267.0 lb

## 2017-02-10 DIAGNOSIS — E1142 Type 2 diabetes mellitus with diabetic polyneuropathy: Secondary | ICD-10-CM

## 2017-02-10 DIAGNOSIS — I110 Hypertensive heart disease with heart failure: Secondary | ICD-10-CM | POA: Diagnosis not present

## 2017-02-10 DIAGNOSIS — E1136 Type 2 diabetes mellitus with diabetic cataract: Secondary | ICD-10-CM | POA: Diagnosis not present

## 2017-02-10 DIAGNOSIS — Z6837 Body mass index (BMI) 37.0-37.9, adult: Secondary | ICD-10-CM

## 2017-02-10 DIAGNOSIS — I5030 Unspecified diastolic (congestive) heart failure: Secondary | ICD-10-CM

## 2017-02-10 DIAGNOSIS — IMO0002 Reserved for concepts with insufficient information to code with codable children: Secondary | ICD-10-CM

## 2017-02-10 DIAGNOSIS — Z794 Long term (current) use of insulin: Secondary | ICD-10-CM

## 2017-02-10 DIAGNOSIS — E1165 Type 2 diabetes mellitus with hyperglycemia: Secondary | ICD-10-CM | POA: Diagnosis not present

## 2017-02-10 DIAGNOSIS — Z789 Other specified health status: Secondary | ICD-10-CM | POA: Diagnosis not present

## 2017-02-10 DIAGNOSIS — Z9981 Dependence on supplemental oxygen: Secondary | ICD-10-CM | POA: Insufficient documentation

## 2017-02-10 DIAGNOSIS — H548 Legal blindness, as defined in USA: Secondary | ICD-10-CM

## 2017-02-10 DIAGNOSIS — N184 Chronic kidney disease, stage 4 (severe): Secondary | ICD-10-CM | POA: Diagnosis not present

## 2017-02-10 DIAGNOSIS — E11311 Type 2 diabetes mellitus with unspecified diabetic retinopathy with macular edema: Secondary | ICD-10-CM | POA: Diagnosis not present

## 2017-02-10 DIAGNOSIS — J449 Chronic obstructive pulmonary disease, unspecified: Secondary | ICD-10-CM | POA: Insufficient documentation

## 2017-02-10 MED ORDER — GLUCOSE BLOOD VI STRP: ORAL_STRIP | 0 refills | Status: DC

## 2017-02-10 MED ORDER — LORAZEPAM 0.5 MG PO TABS: 0.5000 mg | ORAL_TABLET | Freq: Two times a day (BID) | ORAL | 0 refills | Status: DC | PRN

## 2017-02-10 NOTE — Progress Notes (Signed)
Chief Complaint  Patient presents with  . Establish Care    HPI   He reports that he is a patient at the [facility] who wanted to get established at this practice.  He states that he will be getting bariatric surgery.   Diabetic Neuropathy He states that he is fitted for insoles for diabetic shoes and will need authorization He is in the bariatric program and going through diabetic education His sugars are 129 on average He was advised to do 15g protein and 5 g of carbohydrates lantus at bedtime 25-35 depending on his carbs lantus in the morning 45 units novolog sliding scale Lab Results  Component Value Date   HGBA1C 7.3 12/22/2016   Stage IV kidney disease He states that he needs a kidney transplant He states that his transplant doctor recommended the weight loss Lab Results  Component Value Date   CREATININE 3.60 (H) 62/69/4854    Acute diastolic heart failure Advised to increase amlodipine 10mg  to daily Stop clonidine And decrease asa to 81mg  He is taking metolazone, verapamil, lasix and hydralazine with corge and lipitor Lab Results  Component Value Date   CHOL 166 02/08/2017   HDL 25 (L) 02/08/2017   [place] Comment 02/08/2017   LDLDIRECT 99 09/16/2010   TRIG 513 (H) 02/08/2017   CHOLHDL 6.6 (H) 02/08/2017    Legally blind Low vision He sees Ophthalmology and Optometry He fills out his pill bottles once a week He uses his magnifying glass to see what is on the bottle   Tobacco use and cocaine use Pt last cocaine use was 10/2016 He is using his nicotine patch and smoked a cigarette or two this week He is avoiding the people he normally smoked and used cocaine with.  Past Medical History:  Diagnosis Date  . Acute diastolic heart failure ([place])   . CHF (congestive heart failure) ([place])   . Diabetes mellitus with nephropathy ([place]) 05/12/2012   Dr. Justin Mend follows  . Hemorrhoids 05/12/2012  . Hyperlipidemia   . Hypertension   . Lumbar spinal  stenosis 05/03/2012   Mild with only right L4 nerve root encroachment, no neurogenic claudication   . Lumbar spondylosis 05/03/2012  . Meralgia paraesthetica 05/03/2012  . Meralgia paraesthetica 05/03/2012   On Lyrica which does improve pain.   Marland Kitchen Neuropathy in diabetes ([place]) 05/12/2012  . Obesity   . Pneumonia   . Retinopathy   . Sleep apnea    uses cpap  . Substance abuse   . Urinary incontinence 05/12/2012   Since the start of Sept. 2013     Current Outpatient Prescriptions  Medication Sig Dispense Refill  . amitriptyline (ELAVIL) 50 MG tablet Take 1 tablet (50 mg total) by mouth at bedtime. 30 tablet 1  . amLODipine (NORVASC) 10 MG tablet Take 1 tablet (10 mg total) by mouth daily. 90 tablet 3  . aspirin EC 81 MG tablet Take 1 tablet (81 mg total) by mouth daily. 90 tablet 3  . atorvastatin (LIPITOR) 80 MG tablet TAKE ONE (1) TABLET BY MOUTH EVERY DAY 30 tablet 3  . carvedilol (COREG) 25 MG tablet TAKE ONE (1) TABLET BY MOUTH TWO (2) TIMES DAILY WITH FOOD 60 tablet 3  . cetirizine (ZYRTEC) 10 MG tablet Take 1 tablet (10 mg total) by mouth daily. 30 tablet 11  . clotrimazole-betamethasone (LOTRISONE) cream Apply 1 application topically 2 (two) times daily. 30 g 2  . fluticasone (FLONASE) 50 MCG/ACT nasal spray Place 2 sprays into both nostrils  daily. 16 g 3  . furosemide (LASIX) 80 MG tablet Take 2 tablets (160 mg total) by mouth 2 (two) times daily. 120 tablet 1  . gabapentin (NEURONTIN) 300 MG capsule Take 1 capsule (300 mg total) by mouth 3 (three) times daily. 200 mg in the morning and 100 mg in the evening. 90 capsule 0  . glucose blood (ACCU-CHEK AVIVA) test strip Use as directed to check blood sugar qid before meals. 100 each 0  . hydrALAZINE (APRESOLINE) 100 MG tablet Take 1 tablet (100 mg total) by mouth 3 (three) times daily. 90 tablet 3  . hydrOXYzine (ATARAX/VISTARIL) 25 MG tablet TAKE ONE (1) TABLET BY MOUTH 3 TIMES DAILY AS NEEDED FOR ITCHING 30 tablet 3  . insulin aspart  (NOVOLOG FLEXPEN) 100 UNIT/ML FlexPen INJECT 0-60 UNITS INTO THE SKIN 3 TIMES DAILY WITH MEALS.  INJECT INTO THE SKIN AS DIRECTED BASED ON SLIDING SCALE 30 mL 3  . insulin glargine (LANTUS) 100 UNIT/ML injection Inject 0.25-0.45 mLs (25-45 Units total) into the skin 2 (two) times daily. Inject 45 units into the skin daily in the morning. Inject 25 units into the skin daily at night 10 mL 3  . metolazone (ZAROXOLYN) 5 MG tablet Take 1 tablet (5 mg total) by mouth daily. 30 tablet 0  . nicotine (NICODERM CQ) 21 mg/24hr patch Place 1 patch (21 mg total) onto the skin daily. 28 patch 4  . nitroGLYCERIN (NITROSTAT) 0.4 MG SL tablet Place 1 tablet (0.4 mg total) under the tongue every 5 (five) minutes as needed for chest pain. 25 tablet prn  . Polyvinyl Alcohol-Povidone (REFRESH OP) Place 2 drops into both eyes 3 (three) times daily as needed (dry eyes).    . Potassium Chloride ER 20 MEQ TBCR 20 mEq by Per post-pyloric tube route 2 (two) times daily. (Patient taking differently: Take 20 mEq by mouth 2 (two) times daily. ) 60 tablet 1  . tetrahydrozoline-zinc (EYE DROPS ALLERGY RELIEF) 0.05-0.25 % ophthalmic solution Place 2 drops into both eyes 3 (three) times daily as needed. 15 mL 1  . verapamil (CALAN-SR) 120 MG CR tablet Take 1 tablet (120 mg total) by mouth at bedtime. 30 tablet 3  . polyethylene glycol powder (GLYCOLAX/MIRALAX) powder Take 255 g by mouth daily. (Patient not taking: Reported on 02/10/2017) 255 g 3  . urea (GORDONS UREA) 40 % ointment Apply topically as needed. (Patient not taking: Reported on 02/10/2017) 30 g 2   No current facility-administered medications for this visit.     Allergies: No Known Allergies  Past Surgical History:  Procedure Laterality Date  . ABDOMINAL SURGERY     Abscess I&D 2/2 infected hair  . AV FISTULA PLACEMENT Left 02/06/2016   Procedure: LEFT ARM RADIOCEPHALIC ARTERIOVENOUS (AV) FISTULA CREATION;  Surgeon: Rosetta Posner, MD;  Location: [facility];  Service:  Vascular;  Laterality: Left;  . COLONOSCOPY W/ POLYPECTOMY     pt to bring records  . COLONOSCOPY WITH PROPOFOL N/A 06/05/2016   Procedure: COLONOSCOPY WITH PROPOFOL;  Surgeon: Doran Stabler, MD;  Location: [facility];  Service: Gastroenterology;  Laterality: N/A;  . TONSILLECTOMY      Social History   Social History  . Marital status: Single    Spouse name: N/A  . Number of children: N/A  . Years of education: N/A   Occupational History  . dietary services [employer]   Social History Main Topics  . Smoking status: Current Every Day Smoker    Packs/day: 0.50  Years: 30.00    Types: Cigarettes  . Smokeless tobacco: Never Used     Comment: "Trying - hard to stop"  . Alcohol use No     Comment: " about 40 ounce beer per month."  down to a beer every 3 weeks  . Drug use: No     Comment: marijuana "laced with something"; today, stated "no" to question of illegal drug use 06/16/2016  . Sexual activity: Not Asked   Other Topics Concern  . None   Social History Narrative  . None    Review of Systems  Constitutional: Negative for chills and fever.  Respiratory: Negative for cough, shortness of breath and wheezing.   Cardiovascular: Negative for chest pain and palpitations.  Gastrointestinal: Negative for abdominal pain and diarrhea.  Neurological: Negative for dizziness, tingling and headaches.  Psychiatric/Behavioral: Negative for depression and substance abuse.    Objective: Vitals:   02/10/17 0857 02/10/17 0858  BP: (!) 153/84 (!) 145/89  Pulse: 64   Resp: 18   Temp: 98 F (36.7 C)   TempSrc: Oral   SpO2: 98%   Weight: 267 lb (121.1 kg)   Height: 5' 10.5" (1.791 m)   Body mass index is 37.77 kg/m. Wt Readings from Last 3 Encounters:  02/10/17 267 lb (121.1 kg)  02/08/17 265 lb 12.8 oz (120.6 kg)  02/01/17 270 lb 3.2 oz (122.6 kg)     Physical Exam  Constitutional: He appears well-developed and well-nourished.  HENT:  Head: Normocephalic and  atraumatic.  Cardiovascular: Normal rate, regular rhythm and normal heart sounds.   Pulmonary/Chest: Effort normal and breath sounds normal. No respiratory distress.    Assessment and Plan [place] was seen today for establish care.  Diagnoses and all orders for this visit:  Diabetic peripheral neuropathy associated with type 2 diabetes mellitus (HCC) Insulin dependent type 2 diabetes mellitus, uncontrolled ([place]) Diabetic macular edema of left eye, with cataract, associated with type 2 diabetes mellitus (HCC) Chronic kidney disease, stage IV (severe) (HCC) Diastolic heart failure secondary to hypertension ([place]) Legally blind Class 2 severe obesity due to excess calories with serious comorbidity and body mass index (BMI) of 37.0 to 37.9 in adult [facility]) Medically complex patient  Reviewed all medications and specialty notes Discussed assessments and plan with patient Wrote prescription for diabetic custom molded shoe Discussed his current use of meds Discussed need for 2 months follow up  A total of 45 minutes were spent face-to-face with the patient during this encounter and over half of that time was spent on counseling and coordination of care.    [place]

## 2017-02-10 NOTE — Telephone Encounter (Signed)
Prescriptions Faxed to [facility] Fax # [phone] ** Dispense 1  Refill 1 Diabetic custom molded shoe  Diagnosis E11.42 Diabetic Peripheral Neuropathy Associated w/ Type 2 Diabtes  Dr. Nolon Rod  Successful Fax Notice

## 2017-02-10 NOTE — Patient Instructions (Addendum)
   IF you received an x-ray today, you will receive an invoice from [facility]. Please contact [facility] at [phone] with questions or concerns regarding your invoice.   IF you received labwork today, you will receive an invoice from [facility]. Please contact [facility] at [phone] with questions or concerns regarding your invoice.   Our billing staff will not be able to assist you with questions regarding bills from these companies.  You will be contacted with the lab results as soon as they are available. The fastest way to get your results is to activate your My Chart account. Instructions are located on the last page of this paperwork. If you have not heard from us regarding the results in 2 weeks, please contact this office.     Blood Glucose Monitoring, Adult Monitoring your blood sugar (glucose) helps you manage your diabetes. It also helps you and your health care provider determine how well your diabetes management plan is working. Blood glucose monitoring involves checking your blood glucose as often as directed, and keeping a record (log) of your results over time. Why should I monitor my blood glucose? Checking your blood glucose regularly can:  Help you understand how food, exercise, illnesses, and medicines affect your blood glucose.  Let you know what your blood glucose is at any time. You can quickly tell if you are having low blood glucose (hypoglycemia) or high blood glucose (hyperglycemia).  Help you and your health care provider adjust your medicines as needed.  When should I check my blood glucose? Follow instructions from your health care provider about how often to check your blood glucose. This may depend on:  The type of diabetes you have.  How well-controlled your diabetes is.  Medicines you are taking.  If you have type 1 diabetes:  Check your blood glucose at least 2 times a day.  Also check your blood glucose: ? Before  every insulin injection. ? Before and after exercise. ? Between meals. ? 2 hours after a meal. ? Occasionally between 2:00 a.m. and 3:00 a.m., as directed. ? Before potentially dangerous tasks, like driving or using heavy machinery. ? At bedtime.  You may need to check your blood glucose more often, up to 6-10 times a day: ? If you use an insulin pump. ? If you need multiple daily injections (MDI). ? If your diabetes is not well-controlled. ? If you are ill. ? If you have a history of severe hypoglycemia. ? If you have a history of not knowing when your blood glucose is getting low (hypoglycemia unawareness). If you have type 2 diabetes:  If you take insulin or other diabetes medicines, check your blood glucose at least 2 times a day.  If you are on intensive insulin therapy, check your blood glucose at least 4 times a day. Occasionally, you may also need to check between 2:00 a.m. and 3:00 a.m., as directed.  Also check your blood glucose: ? Before and after exercise. ? Before potentially dangerous tasks, like driving or using heavy machinery.  You may need to check your blood glucose more often if: ? Your medicine is being adjusted. ? Your diabetes is not well-controlled. ? You are ill. What is a blood glucose log?  A blood glucose log is a record of your blood glucose readings. It helps you and your health care provider: ? Look for patterns in your blood glucose over time. ? Adjust your diabetes management plan as needed.  Every time you check   your blood glucose, write down your result and notes about things that may be affecting your blood glucose, such as your diet and exercise for the day.  Most glucose meters store a record of glucose readings in the meter. Some meters allow you to download your records to a computer. How do I check my blood glucose? Follow these steps to get accurate readings of your blood glucose: Supplies needed   Blood glucose meter.  Test  strips for your meter. Each meter has its own strips. You must use the strips that come with your meter.  A needle to prick your finger (lancet). Do not use lancets more than once.  A device that holds the lancet (lancing device).  A journal or log book to write down your results. Procedure  Wash your hands with soap and water.  Prick the side of your finger (not the tip) with the lancet. Use a different finger each time.  Gently rub the finger until a small drop of blood appears.  Follow instructions that come with your meter for inserting the test strip, applying blood to the strip, and using your blood glucose meter.  Write down your result and any notes. Alternative testing sites  Some meters allow you to use areas of your body other than your finger (alternative sites) to test your blood.  If you think you may have hypoglycemia, or if you have hypoglycemia unawareness, do not use alternative sites. Use your finger instead.  Alternative sites may not be as accurate as the fingers, because blood flow is slower in these areas. This means that the result you get may be delayed, and it may be different from the result that you would get from your finger.  The most common alternative sites are: ? Forearm. ? Thigh. ? Palm of the hand. Additional tips  Always keep your supplies with you.  If you have questions or need help, all blood glucose meters have a 24-hour "hotline" number that you can call. You may also contact your health care provider.  After you use a few boxes of test strips, adjust (calibrate) your blood glucose meter by following instructions that came with your meter. This information is not intended to replace advice given to you by your health care provider. Make sure you discuss any questions you have with your health care provider. Document Released: 08/20/2003 Document Revised: 03/06/2016 Document Reviewed: 01/27/2016 Elsevier Interactive Patient Education   2017 Elsevier Inc.  

## 2017-02-15 ENCOUNTER — Telehealth: Payer: Self-pay | Admitting: Family Medicine

## 2017-02-15 ENCOUNTER — Other Ambulatory Visit: Payer: Self-pay | Admitting: Family Medicine

## 2017-02-15 NOTE — Telephone Encounter (Signed)
PATIENT STATES HE SAW DR. Nolon Rod ABOUT A WEEK AGO FOR HIS DIABETES. SHE SAW THAT HIS TONGUE WAS WHITE AND TOLD HIM HE WAS PROBABLY DEHYDRATED. HE WANTS TO KNOW WHAT HE NEEDS TO DO ABOUT THAT? BEST PHONE [phone] (CELL) PHARMACY CHOICE IS [facility] ON [address]. [place]

## 2017-02-16 NOTE — Telephone Encounter (Signed)
Left VM on Cell for pt to CB

## 2017-03-01 ENCOUNTER — Encounter: Payer: Medicare HMO | Attending: Surgery | Admitting: Skilled Nursing Facility1

## 2017-03-01 ENCOUNTER — Encounter: Payer: Self-pay | Admitting: Skilled Nursing Facility1

## 2017-03-01 ENCOUNTER — Telehealth: Payer: Self-pay

## 2017-03-01 DIAGNOSIS — N189 Chronic kidney disease, unspecified: Secondary | ICD-10-CM | POA: Insufficient documentation

## 2017-03-01 DIAGNOSIS — I509 Heart failure, unspecified: Secondary | ICD-10-CM | POA: Insufficient documentation

## 2017-03-01 DIAGNOSIS — E1122 Type 2 diabetes mellitus with diabetic chronic kidney disease: Secondary | ICD-10-CM | POA: Insufficient documentation

## 2017-03-01 DIAGNOSIS — Z6836 Body mass index (BMI) 36.0-36.9, adult: Secondary | ICD-10-CM | POA: Diagnosis not present

## 2017-03-01 DIAGNOSIS — I130 Hypertensive heart and chronic kidney disease with heart failure and stage 1 through stage 4 chronic kidney disease, or unspecified chronic kidney disease: Secondary | ICD-10-CM | POA: Insufficient documentation

## 2017-03-01 DIAGNOSIS — Z713 Dietary counseling and surveillance: Secondary | ICD-10-CM | POA: Insufficient documentation

## 2017-03-01 NOTE — Progress Notes (Signed)
gh  Assessment:   4th SWL Appointment. Sleeve Gastrectomy  Pt states he has not had dialysis but has the fistula placed and only gets 16 dollars a month in food stamps. PT NEEDS LARGE PRINT MATERIALS.   Pt arrives having lost about 6 pounds. Pt states he Ate a peanut butter sandwich at 1am because blood sugar is dropping: this morning 159. Pt states it has been too hot to walk. Pt states he has not been to the gym. Pt states he has been looking for a house and has found one which is [address]. Pt states he Worries about his doctor being able to manage his blood pressure and glucose after surgery. Pt was given a list of foods for each food group due to pt stating he needs  Full menu for at least a month.   Start Wt at NDES: 279.10 Wt: 264 BMI:37.88  MEDICATIONS: See List   DIETARY INTAKE:  24-hr recall:  B ( AM):skipped Snk ( AM):  L ( PM): oatmeal-----chicken and veggies---manwich---ribs  Snk ( PM):  D ( PM): chicken and veggies and succotash---peanutbutter and jelly----bacon and tomato with salad Snk ( PM):  Beverages: water, coffee  Usual physical activity: walking and pushups  Diet to Follow: 1800 calories 200 g carbohydrates 135 g protein 50 g fat   Nutritional Diagnosis:  [place]-3.3 Overweight/obesity related to past poor dietary habits and physical inactivity as evidenced by patient w/ recent sleeve gastrectomy surgery following dietary guidelines for continued weight loss.    Intervention:  Nutrition counseling for upcoming Bariatric Surgery. Goals: -15 grams or more of protein and 5 grams or less of carbohydrates - A multivitamin AND calcium  Starting 2 weeks before surgery: No fruit No bread No applesauce No corn No peas No potatoes No sweet potatoes  -Have premier protein and a banana for breakfast -Get to the Gym Barriers to learning/adherence to lifestyle change: financial constraints   Demonstrated degree of understanding via:  Teach Back    Monitoring/Evaluation:  Dietary intake, exercise, and body weight prn.

## 2017-03-01 NOTE — Telephone Encounter (Signed)
Left a vm that referral was sent to [facility] and that they may require that him to get a new referral from his new PCP.

## 2017-03-08 ENCOUNTER — Telehealth: Payer: Self-pay | Admitting: Sports Medicine

## 2017-03-08 NOTE — Telephone Encounter (Signed)
Pt called to check on diabetic shoes and it looks like it was cxled and he said Dr Birdena Crandall was to sign off on it.Please call pt and advise.

## 2017-03-10 DIAGNOSIS — Z01818 Encounter for other preprocedural examination: Secondary | ICD-10-CM | POA: Diagnosis not present

## 2017-03-15 ENCOUNTER — Other Ambulatory Visit: Payer: Self-pay | Admitting: Family Medicine

## 2017-03-25 ENCOUNTER — Ambulatory Visit: Payer: Self-pay | Admitting: Family Medicine

## 2017-03-26 ENCOUNTER — Other Ambulatory Visit: Payer: Self-pay | Admitting: Family Medicine

## 2017-03-27 DIAGNOSIS — E119 Type 2 diabetes mellitus without complications: Secondary | ICD-10-CM | POA: Diagnosis not present

## 2017-03-29 ENCOUNTER — Other Ambulatory Visit: Payer: Self-pay | Admitting: Family Medicine

## 2017-03-31 ENCOUNTER — Encounter: Payer: Medicare Other | Attending: Surgery | Admitting: Skilled Nursing Facility1

## 2017-03-31 ENCOUNTER — Encounter: Payer: Self-pay | Admitting: Skilled Nursing Facility1

## 2017-03-31 DIAGNOSIS — Z713 Dietary counseling and surveillance: Secondary | ICD-10-CM | POA: Insufficient documentation

## 2017-03-31 DIAGNOSIS — E1122 Type 2 diabetes mellitus with diabetic chronic kidney disease: Secondary | ICD-10-CM | POA: Insufficient documentation

## 2017-03-31 DIAGNOSIS — Z6836 Body mass index (BMI) 36.0-36.9, adult: Secondary | ICD-10-CM | POA: Diagnosis not present

## 2017-03-31 DIAGNOSIS — I130 Hypertensive heart and chronic kidney disease with heart failure and stage 1 through stage 4 chronic kidney disease, or unspecified chronic kidney disease: Secondary | ICD-10-CM | POA: Diagnosis not present

## 2017-03-31 DIAGNOSIS — N184 Chronic kidney disease, stage 4 (severe): Secondary | ICD-10-CM

## 2017-03-31 DIAGNOSIS — N189 Chronic kidney disease, unspecified: Secondary | ICD-10-CM | POA: Insufficient documentation

## 2017-03-31 DIAGNOSIS — I509 Heart failure, unspecified: Secondary | ICD-10-CM | POA: Insufficient documentation

## 2017-03-31 NOTE — Progress Notes (Signed)
gh  Assessment:   5th SWL Appointment. Sleeve Gastrectomy  Pt states he has not had dialysis but has the fistula placed and only gets 16 dollars a month in food stamps. PT NEEDS LARGE PRINT MATERIALS.   Pt arrives having lost about 6 pounds. Pt states he is moving homes this Friday. Pt states precious (his dog) has a cut under her eye and he is worried it is infected but he does not have the money to fix it.    Start Wt at NDES: 279.10 Wt: 258.9 BMI:37.15  MEDICATIONS: See List   DIETARY INTAKE:  24-hr recall:  B ( AM):skipped---premier protein and banana  Snk ( AM):  L ( PM): oatmeal-----chicken and veggies---manwich---ribs  Snk ( PM):  D ( PM): chicken and veggies and succotash---peanutbutter and jelly----bacon and tomato with salad Snk ( PM):  Beverages: water, coffee  Usual physical activity: walking and pushups  Diet to Follow: 1800 calories 200 g carbohydrates 135 g protein 50 g fat   Nutritional Diagnosis:  [place]-3.3 Overweight/obesity related to past poor dietary habits and physical inactivity as evidenced by patient w/ recent sleeve gastrectomy surgery following dietary guidelines for continued weight loss.    Intervention:  Nutrition counseling for upcoming Bariatric Surgery. Goals: -15 grams or more of protein and 5 grams or less of carbohydrates - A multivitamin AND calcium  Starting 2 weeks before surgery: No fruit No bread No applesauce No corn No peas No potatoes No sweet potatoes  -Have premier protein and a banana for breakfast -Get to the Gym: aim to be active for 30 minutes every day Barriers to learning/adherence to lifestyle change: financial constraints   Demonstrated degree of understanding via:  Teach Back   Monitoring/Evaluation:  Dietary intake, exercise, and body weight prn.

## 2017-04-06 ENCOUNTER — Ambulatory Visit (INDEPENDENT_AMBULATORY_CARE_PROVIDER_SITE_OTHER): Payer: Medicaid Other | Admitting: Sports Medicine

## 2017-04-06 DIAGNOSIS — H35031 Hypertensive retinopathy, right eye: Secondary | ICD-10-CM | POA: Diagnosis not present

## 2017-04-06 DIAGNOSIS — B351 Tinea unguium: Secondary | ICD-10-CM

## 2017-04-06 DIAGNOSIS — H548 Legal blindness, as defined in USA: Secondary | ICD-10-CM

## 2017-04-06 DIAGNOSIS — E1142 Type 2 diabetes mellitus with diabetic polyneuropathy: Secondary | ICD-10-CM | POA: Diagnosis not present

## 2017-04-06 DIAGNOSIS — M2141 Flat foot [pes planus] (acquired), right foot: Secondary | ICD-10-CM

## 2017-04-06 DIAGNOSIS — H47321 Drusen of optic disc, right eye: Secondary | ICD-10-CM | POA: Diagnosis not present

## 2017-04-06 DIAGNOSIS — M779 Enthesopathy, unspecified: Secondary | ICD-10-CM

## 2017-04-06 DIAGNOSIS — M7751 Other enthesopathy of right foot: Secondary | ICD-10-CM | POA: Diagnosis not present

## 2017-04-06 DIAGNOSIS — M79675 Pain in left toe(s): Secondary | ICD-10-CM

## 2017-04-06 DIAGNOSIS — H35372 Puckering of macula, left eye: Secondary | ICD-10-CM | POA: Diagnosis not present

## 2017-04-06 DIAGNOSIS — M2142 Flat foot [pes planus] (acquired), left foot: Secondary | ICD-10-CM

## 2017-04-06 DIAGNOSIS — E113551 Type 2 diabetes mellitus with stable proliferative diabetic retinopathy, right eye: Secondary | ICD-10-CM | POA: Diagnosis not present

## 2017-04-06 DIAGNOSIS — L853 Xerosis cutis: Secondary | ICD-10-CM

## 2017-04-06 DIAGNOSIS — M79674 Pain in right toe(s): Secondary | ICD-10-CM

## 2017-04-06 DIAGNOSIS — M778 Other enthesopathies, not elsewhere classified: Secondary | ICD-10-CM

## 2017-04-06 DIAGNOSIS — E113552 Type 2 diabetes mellitus with stable proliferative diabetic retinopathy, left eye: Secondary | ICD-10-CM | POA: Diagnosis not present

## 2017-04-06 MED ORDER — TRIAMCINOLONE ACETONIDE 10 MG/ML IJ SUSP: 10.0000 mg | Freq: Once | INTRAMUSCULAR | Status: DC

## 2017-04-06 NOTE — Progress Notes (Addendum)
Subjective: Percy Winterrowd is a 51 y.o. male patient with history of diabetes who returns to office today complaining of long, painful nails  while ambulating in shoes; unable to trim. Patient states that the glucose reading this morning was not recorded. Desires injection for right and diabetic shoes. Patient denies any new changes in medication or new problems.   Patient is legally blind  Patient Active Problem List   Diagnosis Date Noted  . Dependence on nocturnal oxygen therapy 02/10/2017  . COPD (chronic obstructive pulmonary disease) ([place]) 02/10/2017  . Chronic kidney disease 01/06/2016  . CKD (chronic kidney disease), stage III   . Type 2 diabetes, uncontrolled, with neuropathy ([place])   . Marijuana abuse   . Tobacco abuse   . Essential hypertension, malignant   . Chronic kidney disease, stage IV (severe) ([place])   . Legally blind 10/13/2015  . Cocaine use disorder, moderate, dependence ([place]) 07/02/2015  . Accelerated hypertension   . Uncontrolled type 2 diabetes mellitus with ketoacidosis without coma ([place])   . Cocaine abuse   . Hypertensive emergency 06/06/2015  . Chronic respiratory failure ([place])   . Pulmonary nodule 05/22/2015  . Thrombocytopenia ([place]) 12/04/2013  . Diastolic heart failure secondary to hypertension ([place]) 11/30/2013  . Radiculopathy with lower extremity symptoms 11/28/2013  . Depression 06/16/2013  . Chronic recurrent major depressive disorder ([place]) 06/16/2013  . Proliferative diabetic retinopathy ([place]) 06/08/2013  . Diabetic macular edema of left eye, with cataract, associated with type 2 diabetes mellitus ([place]) 06/08/2013  . Meibomianitis 06/08/2013  . Erectile dysfunction associated with type 2 diabetes mellitus ([place]) 04/22/2013  . Decreased peripheral vision of left eye 04/12/2013  . Plantar fasciitis of right foot 04/12/2013  . Obstructive sleep apnea 02/02/2013  . Diabetic peripheral neuropathy associated with type 2 diabetes mellitus ([place]) 12/27/2012   . Abnormality of gait 12/06/2012  . Family history of malignant neoplasm of gastrointestinal tract 10/10/2012  . Personal history of colonic polyps 10/10/2012  . Skin tag 08/18/2012  . Chronic low back pain 07/15/2012  . Preventative health care 06/02/2012  . Hyperlipemia 05/12/2012  . Hypertension 05/12/2012  . Urinary incontinence 05/12/2012  . Hemorrhoids 05/12/2012  . Blood per rectum 05/12/2012  . Meralgia paraesthetica 05/03/2012  . Lumbar spondylosis 05/03/2012  . Lumbar spinal stenosis 05/03/2012  . Insulin dependent type 2 diabetes mellitus, uncontrolled ([place]) 05/13/1999   Current Outpatient Prescriptions on File Prior to Visit  Medication Sig Dispense Refill  . amitriptyline (ELAVIL) 50 MG tablet TAKE ONE TABLET BY MOUTH EVERY NIGHT AT BEDTIME 30 tablet 3  . amLODipine (NORVASC) 10 MG tablet Take 1 tablet (10 mg total) by mouth daily. 90 tablet 3  . aspirin EC 81 MG tablet Take 1 tablet (81 mg total) by mouth daily. 90 tablet 3  . atorvastatin (LIPITOR) 80 MG tablet TAKE ONE (1) TABLET BY MOUTH EVERY DAY 30 tablet 3  . carvedilol (COREG) 25 MG tablet TAKE ONE (1) TABLET BY MOUTH TWO (2) TIMES DAILY WITH FOOD 60 tablet 3  . cetirizine (ZYRTEC) 10 MG tablet Take 1 tablet (10 mg total) by mouth daily. 30 tablet 11  . clotrimazole-betamethasone (LOTRISONE) cream Apply 1 application topically 2 (two) times daily. 30 g 2  . fluticasone (FLONASE) 50 MCG/ACT nasal spray Place 2 sprays into both nostrils daily. 16 g 3  . furosemide (LASIX) 80 MG tablet Take 1 tablet (80 mg total) by mouth 2 (two) times daily. Needs labs for refills. 120 tablet 0  . gabapentin (NEURONTIN) 300  MG capsule TAKE 1 CAPSULE BY MOUTH THREE TIMES A DAY 90 capsule 0  . glucose blood (ACCU-CHEK AVIVA) test strip Use as directed to check blood sugar qid before meals. 100 each 0  . hydrALAZINE (APRESOLINE) 100 MG tablet Take 1 tablet (100 mg total) by mouth 3 (three) times daily. 90 tablet 3  . hydrOXYzine  (ATARAX/VISTARIL) 25 MG tablet TAKE ONE (1) TABLET BY MOUTH 3 TIMES DAILY AS NEEDED FOR ITCHING 30 tablet 3  . insulin aspart (NOVOLOG FLEXPEN) 100 UNIT/ML FlexPen INJECT 0-60 UNITS INTO THE SKIN 3 TIMES DAILY WITH MEALS.  INJECT INTO THE SKIN AS DIRECTED BASED ON SLIDING SCALE 30 mL 3  . LANTUS SOLOSTAR 100 UNIT/ML Solostar Pen INJECT 45 UNITS SUBCUTANEOUSLY EVERY MORNING AND INJECT 25 UNITS EVERY NIGHT 30 mL 0  . metolazone (ZAROXOLYN) 5 MG tablet Take 1 tablet (5 mg total) by mouth daily. 30 tablet 0  . nicotine (NICODERM CQ) 21 mg/24hr patch Place 1 patch (21 mg total) onto the skin daily. 28 patch 4  . nitroGLYCERIN (NITROSTAT) 0.4 MG SL tablet Place 1 tablet (0.4 mg total) under the tongue every 5 (five) minutes as needed for chest pain. 25 tablet prn  . polyethylene glycol powder (GLYCOLAX/MIRALAX) powder Take 255 g by mouth daily. (Patient not taking: Reported on 02/10/2017) 255 g 3  . Polyvinyl Alcohol-Povidone (REFRESH OP) Place 2 drops into both eyes 3 (three) times daily as needed (dry eyes).    . Potassium Chloride ER 20 MEQ TBCR 20 mEq by Per post-pyloric tube route 2 (two) times daily. (Patient taking differently: Take 20 mEq by mouth 2 (two) times daily. ) 60 tablet 1  . tetrahydrozoline-zinc (EYE DROPS ALLERGY RELIEF) 0.05-0.25 % ophthalmic solution Place 2 drops into both eyes 3 (three) times daily as needed. 15 mL 1  . urea (GORDONS UREA) 40 % ointment Apply topically as needed. (Patient not taking: Reported on 02/10/2017) 30 g 2  . verapamil (CALAN-SR) 120 MG CR tablet Take 1 tablet (120 mg total) by mouth at bedtime. 30 tablet 3   No current facility-administered medications on file prior to visit.    No Known Allergies  Recent Results (from the past 2160 hour(s))  Lipid panel     Status: Abnormal   Collection Time: 02/08/17 10:08 AM  Result Value Ref Range   Cholesterol, Total 166 100 - 199 mg/dL   Triglycerides 513 (H) 0 - 149 mg/dL   HDL 25 (L) >39 mg/dL   VLDL  Cholesterol Cal Comment 5 - 40 mg/dL    Comment: The calculation for the VLDL cholesterol is not valid when triglyceride level is >400 mg/dL.    LDL Calculated Comment 0 - 99 mg/dL    Comment: Triglyceride result indicated is too high for an accurate LDL cholesterol estimation.    Chol/HDL Ratio 6.6 (H) 0.0 - 5.0 ratio    Comment:                                   T. Chol/HDL Ratio                                             Men  Women  1/2 Avg.Risk  3.4    3.3                                   Avg.Risk  5.0    4.4                                2X Avg.Risk  9.6    7.1                                3X Avg.Risk 23.4   11.0     Objective: General: Patient is awake, alert, and oriented x 3 and in no acute distress.  Integument: Skin is warm, dry and supple bilateral. Nails are tender, long, thickened and dystrophic with subungual debris, consistent with onychomycosis, 1-5 bilateral. No signs of infection. Patches of tinea and Dry skin and heel callus. No open lesions or preulcerative lesions present bilateral. Remaining integument unremarkable.  Vasculature:  Dorsalis Pedis pulse 1/4 bilateral. Posterior Tibial pulse  1/4 bilateral. Capillary fill time <3 sec 1-5 bilateral. Scant hair growth to the level of the digits.Temperature gradient within normal limits. No varicosities present bilateral. No edema present bilateral.   Neurology: The patient has intact sensation measured with a 5.07/10g Semmes Weinstein Monofilament at all pedal sites bilateral . Vibratory sensation diminished bilateral with tuning fork. No Babinski sign present bilateral.   Musculoskeletal: Pes planus pedal deformities noted bilateral. Muscular strength 5/5 in all lower extremity muscular groups bilateral with pain at right 1st MTPJ at end range of motion, likely hallux limitus. No tenderness with calf compression bilateral.  Assessment and Plan: Problem List Items Addressed This Visit     None    Visit Diagnoses    Pain due to onychomycosis of toenails of both feet    -  Primary   Pes planus of both feet       Legal blindness       Dry skin       Capsulitis of foot, right       1st MTPJ   Relevant Medications   triamcinolone acetonide (KENALOG) 10 MG/ML injection 10 mg (Start on 04/06/2017  6:45 PM)   Diabetic polyneuropathy associated with type 2 diabetes mellitus ([place])          -Examined patient. -Discussed and educated patient on diabetic foot care, especially with  regards to the vascular, neurological and musculoskeletal systems.  -Stressed the importance of good glycemic control and the detriment of not  controlling glucose levels in relation to the foot. -After oral consent and aseptic prep, injected a mixture containing 1 ml of 2%  plain lidocaine, 1 ml 0.5% plain marcaine, 0.5 ml of kenalog 10 and 0.5 ml of dexamethasone phosphate into right 1st MTPJ without complication. Post-injection care discussed with patient.  If pain still present next visit will xray.  -Mechanically debrided all nails 1-5 bilateral using sterile nail nipper and filed with dremel without incident  -Recommend patient to use [employer] Healthy Feet or Skin emollients at home daily  -[employer] diabetic shoe order form was completed; office to contact primary care for approval / certification;  Office to arrange shoe fitting and dispensing. -Answered all patient questions -Patient to return  in 3 months for at risk foot care -Patient advised to call the office  if any problems or questions arise in the meantime.  Landis Martins, DPM

## 2017-04-09 DIAGNOSIS — L729 Follicular cyst of the skin and subcutaneous tissue, unspecified: Secondary | ICD-10-CM | POA: Diagnosis not present

## 2017-04-13 DIAGNOSIS — N2581 Secondary hyperparathyroidism of renal origin: Secondary | ICD-10-CM | POA: Diagnosis not present

## 2017-04-13 DIAGNOSIS — E1122 Type 2 diabetes mellitus with diabetic chronic kidney disease: Secondary | ICD-10-CM | POA: Diagnosis not present

## 2017-04-13 DIAGNOSIS — N184 Chronic kidney disease, stage 4 (severe): Secondary | ICD-10-CM | POA: Diagnosis not present

## 2017-04-13 DIAGNOSIS — Z01818 Encounter for other preprocedural examination: Secondary | ICD-10-CM | POA: Diagnosis not present

## 2017-04-13 DIAGNOSIS — R7989 Other specified abnormal findings of blood chemistry: Secondary | ICD-10-CM | POA: Diagnosis not present

## 2017-04-13 DIAGNOSIS — N183 Chronic kidney disease, stage 3 (moderate): Secondary | ICD-10-CM | POA: Diagnosis not present

## 2017-04-14 ENCOUNTER — Telehealth: Payer: Self-pay | Admitting: Skilled Nursing Facility1

## 2017-04-14 NOTE — Telephone Encounter (Signed)
Pt states his kidney doctor states he can get on the list for a transplant  without needing bariatric surgery so he will not get the surgery.

## 2017-04-22 ENCOUNTER — Ambulatory Visit: Payer: Self-pay | Admitting: Psychiatry

## 2017-04-26 ENCOUNTER — Other Ambulatory Visit: Payer: Self-pay | Admitting: Emergency Medicine

## 2017-04-26 ENCOUNTER — Other Ambulatory Visit: Payer: Self-pay | Admitting: Family Medicine

## 2017-04-26 DIAGNOSIS — R059 Cough, unspecified: Secondary | ICD-10-CM

## 2017-04-26 DIAGNOSIS — R05 Cough: Secondary | ICD-10-CM

## 2017-04-26 MED ORDER — GABAPENTIN 300 MG PO CAPS: 300.0000 mg | ORAL_CAPSULE | Freq: Three times a day (TID) | ORAL | 0 refills | Status: DC

## 2017-04-26 MED ORDER — FUROSEMIDE 80 MG PO TABS: 80.0000 mg | ORAL_TABLET | Freq: Two times a day (BID) | ORAL | 0 refills | Status: DC

## 2017-05-04 ENCOUNTER — Ambulatory Visit: Payer: Self-pay | Admitting: Skilled Nursing Facility1

## 2017-05-10 ENCOUNTER — Encounter: Payer: Self-pay | Admitting: Family Medicine

## 2017-05-10 ENCOUNTER — Ambulatory Visit (INDEPENDENT_AMBULATORY_CARE_PROVIDER_SITE_OTHER): Payer: Medicare Other | Admitting: Family Medicine

## 2017-05-10 VITALS — BP 148/84 | HR 74 | Temp 97.9°F | Resp 17 | Ht 70.0 in | Wt 262.4 lb

## 2017-05-10 DIAGNOSIS — Z5181 Encounter for therapeutic drug level monitoring: Secondary | ICD-10-CM | POA: Diagnosis not present

## 2017-05-10 DIAGNOSIS — N184 Chronic kidney disease, stage 4 (severe): Secondary | ICD-10-CM

## 2017-05-10 DIAGNOSIS — L853 Xerosis cutis: Secondary | ICD-10-CM

## 2017-05-10 DIAGNOSIS — Z794 Long term (current) use of insulin: Secondary | ICD-10-CM | POA: Diagnosis not present

## 2017-05-10 DIAGNOSIS — E0865 Diabetes mellitus due to underlying condition with hyperglycemia: Secondary | ICD-10-CM | POA: Diagnosis not present

## 2017-05-10 DIAGNOSIS — Z0000 Encounter for general adult medical examination without abnormal findings: Secondary | ICD-10-CM | POA: Diagnosis not present

## 2017-05-10 DIAGNOSIS — Z23 Encounter for immunization: Secondary | ICD-10-CM

## 2017-05-10 DIAGNOSIS — E083499 Diabetes mellitus due to underlying condition with severe nonproliferative diabetic retinopathy without macular edema, unspecified eye: Secondary | ICD-10-CM

## 2017-05-10 LAB — POCT GLYCOSYLATED HEMOGLOBIN (HGB A1C): Hemoglobin A1C: 9.1

## 2017-05-10 MED ORDER — POLYVINYL ALCOHOL-POVIDONE PF 1.4-0.6 % OP SOLN: 2.0000 [drp] | Freq: Three times a day (TID) | OPHTHALMIC | 6 refills | Status: DC | PRN

## 2017-05-10 MED ORDER — VERAPAMIL HCL ER 120 MG PO TBCR: 120.0000 mg | EXTENDED_RELEASE_TABLET | Freq: Every day | ORAL | 3 refills | Status: DC

## 2017-05-10 MED ORDER — AMLODIPINE BESYLATE 10 MG PO TABS: 10.0000 mg | ORAL_TABLET | Freq: Every day | ORAL | 3 refills | Status: DC

## 2017-05-10 MED ORDER — POTASSIUM CHLORIDE ER 20 MEQ PO TBCR: 20.0000 meq | EXTENDED_RELEASE_TABLET | Freq: Two times a day (BID) | ORAL | 6 refills | Status: DC

## 2017-05-10 MED ORDER — CLOTRIMAZOLE 1 % EX CREA: 1.0000 "application " | TOPICAL_CREAM | Freq: Two times a day (BID) | CUTANEOUS | 0 refills | Status: DC

## 2017-05-10 MED ORDER — GLUCOSE BLOOD VI STRP: ORAL_STRIP | 0 refills | Status: DC

## 2017-05-10 MED ORDER — CARVEDILOL 25 MG PO TABS: ORAL_TABLET | ORAL | 3 refills | Status: DC

## 2017-05-10 MED ORDER — ATORVASTATIN CALCIUM 80 MG PO TABS: ORAL_TABLET | ORAL | 3 refills | Status: DC

## 2017-05-10 MED ORDER — HYDROXYZINE HCL 25 MG PO TABS: ORAL_TABLET | ORAL | 3 refills | Status: DC

## 2017-05-10 MED ORDER — CETIRIZINE HCL 10 MG PO TABS: 10.0000 mg | ORAL_TABLET | Freq: Every day | ORAL | 11 refills | Status: DC

## 2017-05-10 MED ORDER — ACCU-CHEK AVIVA DEVI: 0 refills | Status: DC

## 2017-05-10 MED ORDER — UREA 40 % EX OINT: TOPICAL_OINTMENT | CUTANEOUS | 2 refills | Status: DC | PRN

## 2017-05-10 MED ORDER — GLUCOSE BLOOD VI STRP: ORAL_STRIP | 11 refills | Status: DC

## 2017-05-10 MED ORDER — GABAPENTIN 300 MG PO CAPS: 300.0000 mg | ORAL_CAPSULE | Freq: Three times a day (TID) | ORAL | 0 refills | Status: DC

## 2017-05-10 NOTE — Progress Notes (Signed)
QUICK REFERENCE INFORMATION: The ABCs of Providing the Annual Wellness Visit  CMS.gov Medicare Learning Network  [employer] Annual Wellness Visit  Subjective:   Jerry Cantrell is a 51 y.o. Male who presents for an Annual Wellness Visit.  Lab Results  Component Value Date   HGBA1C 9.1 05/10/2017   Pt is taking 45 units lantus qAM and 35 units lantus at night States that he takes his insulin This morning his fasting blood glucose was 98  Component     Latest Ref Rng & Units 12/22/2016  Hemoglobin A1C      7.3   He admits that he has been eating more desserts and drinks a 40 oz beer once a week He usually drinks a cold beer after mowing his grass and moving gravel around the house He admits to also eating more bread   Patient Active Problem List   Diagnosis Date Noted  . Dependence on nocturnal oxygen therapy 02/10/2017  . COPD (chronic obstructive pulmonary disease) ([place]) 02/10/2017  . Chronic kidney disease 01/06/2016  . CKD (chronic kidney disease), stage III   . Type 2 diabetes, uncontrolled, with neuropathy ([place])   . Marijuana abuse   . Tobacco abuse   . Essential hypertension, malignant   . Chronic kidney disease, stage IV (severe) ([place])   . Legally blind 10/13/2015  . Cocaine use disorder, moderate, dependence ([place]) 07/02/2015  . Accelerated hypertension   . Uncontrolled type 2 diabetes mellitus with ketoacidosis without coma ([place])   . Cocaine abuse   . Hypertensive emergency 06/06/2015  . Chronic respiratory failure ([place])   . Pulmonary nodule 05/22/2015  . Thrombocytopenia ([place]) 12/04/2013  . Diastolic heart failure secondary to hypertension ([place]) 11/30/2013  . Radiculopathy with lower extremity symptoms 11/28/2013  . Depression 06/16/2013  . Chronic recurrent major depressive disorder ([place]) 06/16/2013  . Proliferative diabetic retinopathy ([place]) 06/08/2013  . Diabetic macular edema of left eye, with cataract, associated with type 2 diabetes mellitus ([place])  06/08/2013  . Meibomianitis 06/08/2013  . Erectile dysfunction associated with type 2 diabetes mellitus ([place]) 04/22/2013  . Decreased peripheral vision of left eye 04/12/2013  . Plantar fasciitis of right foot 04/12/2013  . Obstructive sleep apnea 02/02/2013  . Diabetic peripheral neuropathy associated with type 2 diabetes mellitus ([place]) 12/27/2012  . Abnormality of gait 12/06/2012  . Family history of malignant neoplasm of gastrointestinal tract 10/10/2012  . Personal history of colonic polyps 10/10/2012  . Skin tag 08/18/2012  . Chronic low back pain 07/15/2012  . Preventative health care 06/02/2012  . Hyperlipemia 05/12/2012  . Hypertension 05/12/2012  . Urinary incontinence 05/12/2012  . Hemorrhoids 05/12/2012  . Blood per rectum 05/12/2012  . Meralgia paraesthetica 05/03/2012  . Lumbar spondylosis 05/03/2012  . Lumbar spinal stenosis 05/03/2012  . Insulin dependent type 2 diabetes mellitus, uncontrolled ([place]) 05/13/1999    Past Medical History:  Diagnosis Date  . Acute diastolic heart failure ([place])   . CHF (congestive heart failure) ([place])   . Diabetes mellitus with nephropathy ([place]) 05/12/2012   Dr. Justin Mend follows  . Hemorrhoids 05/12/2012  . Hyperlipidemia   . Hypertension   . Lumbar spinal stenosis 05/03/2012   Mild with only right L4 nerve root encroachment, no neurogenic claudication   . Lumbar spondylosis 05/03/2012  . Meralgia paraesthetica 05/03/2012  . Meralgia paraesthetica 05/03/2012   On Lyrica which does improve pain.   Marland Kitchen Neuropathy in diabetes ([place]) 05/12/2012  . Obesity   . Pneumonia   . Retinopathy   . Sleep  apnea    uses cpap  . Substance abuse   . Urinary incontinence 05/12/2012   Since the start of Sept. 2013      Past Surgical History:  Procedure Laterality Date  . ABDOMINAL SURGERY     Abscess I&D 2/2 infected hair  . AV FISTULA PLACEMENT Left 02/06/2016   Procedure: LEFT ARM RADIOCEPHALIC ARTERIOVENOUS (AV) FISTULA CREATION;  Surgeon: Rosetta Posner,  MD;  Location: [facility];  Service: Vascular;  Laterality: Left;  . COLONOSCOPY W/ POLYPECTOMY     pt to bring records  . COLONOSCOPY WITH PROPOFOL N/A 06/05/2016   Procedure: COLONOSCOPY WITH PROPOFOL;  Surgeon: Doran Stabler, MD;  Location: [facility];  Service: Gastroenterology;  Laterality: N/A;  . TONSILLECTOMY       Outpatient Medications Prior to Visit  Medication Sig Dispense Refill  . amitriptyline (ELAVIL) 50 MG tablet TAKE ONE TABLET BY MOUTH EVERY NIGHT AT BEDTIME 30 tablet 3  . aspirin EC 81 MG tablet Take 1 tablet (81 mg total) by mouth daily. 90 tablet 3  . fluticasone (FLONASE) 50 MCG/ACT nasal spray Place 2 sprays into both nostrils daily. 16 g 3  . furosemide (LASIX) 80 MG tablet Take 1 tablet (80 mg total) by mouth 2 (two) times daily. Needs labs for refills. 60 tablet 0  . hydrALAZINE (APRESOLINE) 100 MG tablet Take 1 tablet (100 mg total) by mouth 3 (three) times daily. 90 tablet 3  . insulin aspart (NOVOLOG FLEXPEN) 100 UNIT/ML FlexPen INJECT 0-60 UNITS INTO THE SKIN 3 TIMES DAILY WITH MEALS.  INJECT INTO THE SKIN AS DIRECTED BASED ON SLIDING SCALE 30 mL 3  . LANTUS SOLOSTAR 100 UNIT/ML Solostar Pen INJECT 45 UNITS SUBCUTANEOUSLY EVERY MORNING AND INJECT 25 UNITS EVERY NIGHT 30 mL 0  . metolazone (ZAROXOLYN) 5 MG tablet Take 1 tablet (5 mg total) by mouth daily. 30 tablet 0  . nicotine (NICODERM CQ) 21 mg/24hr patch Place 1 patch (21 mg total) onto the skin daily. 28 patch 4  . nitroGLYCERIN (NITROSTAT) 0.4 MG SL tablet Place 1 tablet (0.4 mg total) under the tongue every 5 (five) minutes as needed for chest pain. 25 tablet prn  . polyethylene glycol powder (GLYCOLAX/MIRALAX) powder Take 255 g by mouth daily. 255 g 3  . tetrahydrozoline-zinc (EYE DROPS ALLERGY RELIEF) 0.05-0.25 % ophthalmic solution Place 2 drops into both eyes 3 (three) times daily as needed. 15 mL 1  . amLODipine (NORVASC) 10 MG tablet Take 1 tablet (10 mg total) by mouth daily. 90 tablet 3  .  atorvastatin (LIPITOR) 80 MG tablet TAKE ONE (1) TABLET BY MOUTH EVERY DAY 30 tablet 3  . carvedilol (COREG) 25 MG tablet TAKE ONE (1) TABLET BY MOUTH TWO (2) TIMES DAILY WITH FOOD 60 tablet 3  . cetirizine (ZYRTEC) 10 MG tablet Take 1 tablet (10 mg total) by mouth daily. 30 tablet 11  . clotrimazole-betamethasone (LOTRISONE) cream Apply 1 application topically 2 (two) times daily. 30 g 2  . gabapentin (NEURONTIN) 300 MG capsule Take 1 capsule (300 mg total) by mouth 3 (three) times daily. 90 capsule 0  . glucose blood (ACCU-CHEK AVIVA) test strip Use as directed to check blood sugar qid before meals. 100 each 0  . hydrOXYzine (ATARAX/VISTARIL) 25 MG tablet TAKE ONE (1) TABLET BY MOUTH 3 TIMES DAILY AS NEEDED FOR ITCHING 30 tablet 3  . Polyvinyl Alcohol-Povidone (REFRESH OP) Place 2 drops into both eyes 3 (three) times daily as needed (dry eyes).    Marland Kitchen  Potassium Chloride ER 20 MEQ TBCR 20 mEq by Per post-pyloric tube route 2 (two) times daily. (Patient taking differently: Take 20 mEq by mouth 2 (two) times daily. ) 60 tablet 1  . urea (GORDONS UREA) 40 % ointment Apply topically as needed. 30 g 2  . verapamil (CALAN-SR) 120 MG CR tablet Take 1 tablet (120 mg total) by mouth at bedtime. 30 tablet 3   Facility-Administered Medications Prior to Visit  Medication Dose Route Frequency Provider Last Rate Last Dose  . triamcinolone acetonide (KENALOG) 10 MG/ML injection 10 mg  10 mg Other Once Landis Martins, DPM        No Known Allergies   Family History  Problem Relation Age of Onset  . Prostate cancer Father   . Alcohol abuse Father   . Emphysema Father        smoked  . Colon cancer Neg Hx      Social History   Social History  . Marital status: Single    Spouse name: N/A  . Number of children: N/A  . Years of education: N/A   Occupational History  . dietary services [employer]   Social History Main Topics  . Smoking status: Current Every Day Smoker    Packs/day: 0.50     Years: 30.00    Types: Cigarettes  . Smokeless tobacco: Never Used     Comment: "Trying - hard to stop"  . Alcohol use No     Comment: " about 40 ounce beer per month."  down to a beer every 3 weeks  . Drug use: No     Comment: marijuana "laced with something"; today, stated "no" to question of illegal drug use 06/16/2016  . Sexual activity: Not Asked   Other Topics Concern  . None   Social History Narrative  . None      Recent Hospitalizations? no  Health Habits: Current exercise activities include: walking Exercise: 5 times/week. Diet: in general, a "healthy" diet    Alcohol intake: occasional social drinking  Health Risk Assessment: The patient has completed a Health Risk Assessment. This has been reveiwed with them and has been scanned into the [facility] system as an attached document.  Current Medical Providers and Suppliers: [facility] Patient Care Team: Forrest Moron, MD as PCP - General (Internal Medicine) Larina Earthly, MD as PCP - Family Medicine (Family Medicine) Future Appointments Date Time Provider [place]  05/12/2017 11:30 AM [facility]  06/09/2017 10:40 AM Forrest Moron, MD [facility]CP[facility]PC[facility]  07/13/2017 1:30 PM Landis Martins, DPM [facility]     Age-appropriate Screening Schedule: The list below includes current immunization status and future screening recommendations based on patient's age. Orders for these recommended tests are listed in the plan section. The patient has been provided with a written plan. Immunization History  Administered Date(s) Administered  . Influenza Split 05/12/2012  . Influenza,inj,Quad PF,6+ Mos 05/11/2013, 05/15/2015, 06/16/2016, 05/10/2017  . Pneumococcal Conjugate-13 06/16/2016  . Pneumococcal Polysaccharide-23 06/16/2013, 01/19/2015  . Td 05/12/2012     Depression Screen-PHQ2/9 completed today  Depression screen [facility] 2/9 05/10/2017 02/10/2017 06/16/2016 01/16/2014 09/26/2013    Decreased Interest 0 0 0 3 0  Down, Depressed, Hopeless 0 0 0 3 0  PHQ - 2 Score 0 0 0 6 0  Altered sleeping - - - 3 -  Tired, decreased energy - - - 3 -  Change in appetite - - - 0 -  Feeling bad or failure about yourself  - - -  3 -  Trouble concentrating - - - 0 -  Moving slowly or fidgety/restless - - - 3 -  Suicidal thoughts - - - 0 -  PHQ-9 Score - - - 18 -  Some recent data might be hidden       Depression Severity and Treatment Recommendations:  0-4= None  5-9= Mild / Treatment: Support, educate to call if worse; return in one month  10-14= Moderate / Treatment: Support, watchful waiting; Antidepressant or Psycotherapy  15-19= Moderately severe / Treatment: Antidepressant OR Psychotherapy  >= 20 = Major depression, severe / Antidepressant AND Psychotherapy  Functional Status Survey: Functional Status Survey: Is the patient deaf or have difficulty hearing?: No Does the patient have difficulty seeing, even when wearing glasses/contacts?: Yes Does the patient have difficulty concentrating, remembering, or making decisions?: No Does the patient have difficulty walking or climbing stairs?: No Does the patient have difficulty dressing or bathing?: No Does the patient have difficulty doing errands alone such as visiting a doctor's office or shopping?: No   Hearing Evaluation: 1. Do you have trouble hearing the television when others do not? no 2. Do you have to strain to hear/understand conversations? no   Advanced Care Planning: 1. Patient has executed an Advance Directive: no  2. If no, patient was given the opportunity to execute an Advance Directive today? n/a  3. Are the patient's advanced directives in [place]? no 4. This patient has the ability to prepare an Advance Directive: yes 5. Provider is willing to follow the patient's wishes: yes  Cognitive Assessment: Does the patient have evidence of cognitive impairment? no The patient does not have any evidence of  any cognitive problems and denies any  change in mood/affect, appearance, speech, memory or motor skills.  Identification of Risk Factors: Risk factors include: diabetes mellitus, family history, hyperlipidemia, hypertension and smoking  Review of Systems  Constitutional: Negative for chills and fever.  Respiratory: Negative for cough, shortness of breath and wheezing.   Cardiovascular: Negative for chest pain and leg swelling.  Gastrointestinal: Negative for abdominal pain, nausea and vomiting.  Genitourinary: Negative for dysuria and hematuria.  Skin: Positive for itching and rash.  Neurological: Negative for dizziness and headaches.  Psychiatric/Behavioral: Negative for depression. The patient is not nervous/anxious and does not have insomnia.     Objective:   Vitals:   05/10/17 1135  BP: (!) 148/84  Pulse: 74  Resp: 17  Temp: 97.9 F (36.6 C)  TempSrc: Oral  SpO2: 98%  Weight: 262 lb 6.4 oz (119 kg)  Height: 5\' 10"  (1.778 m)    Body mass index is 37.65 kg/m.  Physical Exam  Constitutional: He is oriented to person, place, and time. He appears well-developed and well-nourished.  HENT:  Head: Normocephalic and atraumatic.  Eyes: Conjunctivae and EOM are normal.  Neck: Normal range of motion. Neck supple.  Cardiovascular: Normal rate, regular rhythm and normal heart sounds.   No murmur heard. Pulmonary/Chest: Effort normal and breath sounds normal. No respiratory distress. He has no wheezes.  Musculoskeletal: Normal range of motion. He exhibits no edema or deformity.  Neurological: He is alert and oriented to person, place, and time.  Skin: Skin is warm.  Psychiatric: He has a normal mood and affect. His behavior is normal. Judgment and thought content normal.    Assessment/Plan:   Patient Self-Management and Personalized Health Advice The patient has been provided with information about: {quit smoking, begin progressive daily aerobic exercise program, follow a  low fat,  low cholesterol diet, decrease or avoid alcohol intake, improve dietary compliance and continue current healthy lifestyle patterns  During the course of the visit the patient was educated and counseled about appropriate screening and preventive services including:   lab testing as noted in orders section, medication refills are given, counseled on nutrition   Body mass index is 37.65 kg/m. Discussed the patient's BMI with him. The BMI BMI is not in the acceptable range; BMI management plan is completed  Amier was seen today for annual exam.  Diagnoses and all orders for this visit:  Wellness examination- completed age appropriate screenings -     POCT glycosylated hemoglobin (Hb A1C) -     Flu Vaccine QUAD 36+ mos IM -     Lipid panel -     Comprehensive metabolic panel  Encounter for medication monitoring -     POCT glycosylated hemoglobin (Hb A1C) -     Flu Vaccine QUAD 36+ mos IM -     Lipid panel -     Comprehensive metabolic panel  Chronic kidney disease, stage IV (severe) (HCC) -     POCT glycosylated hemoglobin (Hb A1C) -     Flu Vaccine QUAD 36+ mos IM -     Lipid panel -     Comprehensive metabolic panel -     Blood Glucose Monitoring Suppl (ACCU-CHEK AVIVA) device; Use as instructed  Dry skin -     urea (GORDONS UREA) 40 % ointment; Apply topically as needed.  Need for prophylactic vaccination and inoculation against influenza  Diabetes mellitus due to underlying condition, uncontrolled, with severe nonproliferative retinopathy, with long-term current use of insulin, macular edema presence unspecified, unspecified lat* (HCC) -     Blood Glucose Monitoring Suppl (ACCU-CHEK AVIVA) device; Use as instructed Advised follow up in one month Deterioration of diabetes due to nutritional noncompliance Discussed insulin and fasting ranges    Other orders -     clotrimazole (LOTRIMIN AF) 1 % cream; Apply 1 application topically 2 (two) times daily. -      Polyvinyl Alcohol-Povidone PF (REFRESH) 1.4-0.6 % SOLN; Apply 2 drops to eye 3 (three) times daily as needed. -     carvedilol (COREG) 25 MG tablet; TAKE ONE (1) TABLET BY MOUTH TWO (2) TIMES DAILY WITH FOOD -     hydrOXYzine (ATARAX/VISTARIL) 25 MG tablet; TAKE ONE (1) TABLET BY MOUTH 3 TIMES DAILY AS NEEDED FOR ITCHING -     gabapentin (NEURONTIN) 300 MG capsule; Take 1 capsule (300 mg total) by mouth 3 (three) times daily. -     cetirizine (ZYRTEC) 10 MG tablet; Take 1 tablet (10 mg total) by mouth daily. -     verapamil (CALAN-SR) 120 MG CR tablet; Take 1 tablet (120 mg total) by mouth at bedtime. -     amLODipine (NORVASC) 10 MG tablet; Take 1 tablet (10 mg total) by mouth daily. -     Discontinue: glucose blood (ACCU-CHEK AVIVA) test strip; Use as directed to check blood sugar qid before meals. -     atorvastatin (LIPITOR) 80 MG tablet; TAKE ONE (1) TABLET BY MOUTH EVERY DAY -     Discontinue: glucose blood (ACCU-CHEK AVIVA) test strip; Use as directed to check blood sugar 5 times a day before meals and as needed. -     glucose blood (ACCU-CHEK AVIVA) test strip; Use as directed to check blood sugar 5 times a day before meals and as needed. -     Potassium  Chloride ER 20 MEQ TBCR; Take 20 mEq by mouth 2 (two) times daily.      No Follow-up on file.  Future Appointments Date Time Provider [place]  05/12/2017 11:30 AM [facility]  06/09/2017 10:40 AM Forrest Moron, MD PCP-PCP [facility]  07/13/2017 1:30 PM Landis Martins, DPM [facility]    Patient Instructions       IF you received an x-ray today, you will receive an invoice from [facility]. Please contact [facility] at [phone] with questions or concerns regarding your invoice.   IF you received labwork today, you will receive an invoice from [facility]. Please contact [facility] at [phone] with questions or concerns regarding your invoice.   Our billing staff  will not be able to assist you with questions regarding bills from these companies.  You will be contacted with the lab results as soon as they are available. The fastest way to get your results is to activate your My Chart account. Instructions are located on the last page of this paperwork. If you have not heard from [place] regarding the results in 2 weeks, please contact this office.     Insulin Injection Instructions, Single Insulin Dose, Adult A subcutaneous injection is a shot of medicine that is injected into the layer of fat between skin and muscle. People with type 1 diabetes must take insulin because their bodies do not make it. People with type 2 diabetes may need to take insulin. There are many different types of insulin. The type of insulin that you take may determine how many injections you give yourself and when you need to take the injections. Choosing a site for injection Insulin absorption varies from site to site. It is best to inject insulin within the same body area, using a different spot in that area for each injection. Do not inject the insulin in the same spot for each injection. There are five main areas that can be used for injecting. These areas include:  Abdomen. This is the preferred area.  Front of thigh.  Upper, outer side of thigh.  Back of upper arm.  Buttocks.  Using a syringe and vial: single insulin dose First, follow the steps for Getting Ready, then continue with the steps for Pushing Air Into the [place], then follow the steps for Filling the Syringe, and finish with the steps for Injecting the Insulin. Getting Ready  1. Wash your hands with soap and water. If soap and water are not available, use hand sanitizer. 2. Check the expiration date and type of insulin. 3. If you are using CLEAR insulin, check to see that it is clear and free of clumps. 4. Gently roll the medicine bottle (vial) between your palms to warm it. Do not shake the vial. 5. Remove the  plastic pop-top covering from the vial, if one is present. 6. Use an alcohol wipe to clean the rubber top of the vial. 7. Remove the plastic cover from the needle on the syringe. Do not let the needle touch anything. Pushing Air Into the [place]  1. Pull the plunger back to bring (draw up) air into the syringe. The amount of air should be the same as the number of units in a dose of single insulin. 2. While you keep the vial right-side up, poke the needle through the rubber top of the vial. Do not turn the vial upside down to do this. 3. Push the plunger in all the way. This pushes air into  the vial. 4. Do not take the needle out of the vial yet. Filling the Syringe 1. With the needle still inserted in the vial, turn the vial upside down and hold it at eye level. 2. Slowly pull back on the plunger to draw up the desired number of insulin units into the syringe. 3. If you see air bubbles in the syringe, slowly move the plunger up and down 2 or 3 times to get rid of them. 4. Pull back the plunger until the syringe is filled to the correct dose. 5. Remove the needle from the vial. Do not let the needle touch anything. Injecting the Insulin  1. Use an alcohol wipe to clean the site where you will be injecting the needle. Let the site air-dry. 2. Hold the syringe in your writing hand like a pencil. 3. Use your other hand to pinch and hold about an inch of skin. Do not directly touch the cleaned part of the skin. 4. Gently but quickly, put the needle straight into the skin. The needle should be at a 90-degree angle (perpendicular) to the skin, as if to form the letter "L." ? For example, if you are giving an injection in the abdomen, the abdomen forms one "leg" of the "L" and the needle forms the other "leg" of the "L." 5. For adults who have a small amount of body fat, the needle may need to be injected at a 45-degree angle instead. Your health care provider will tell you if this is necessary. ? A  45-degree angle looks like the letter "V." 6. Push the needle in as far as it will go (to the hub). 7. When the needle is completely inserted into the skin, use the thumb of your writing hand to push the plunger down all the way to inject the insulin. 8. Let go of the skin that you are pinching. Continue to hold the syringe in place with your writing hand. 9. Wait 5 seconds, then pull the needle straight out of the skin. 10. Press and hold the alcohol wipe over the injection site until any bleeding stops. Do not rub the area. 11. Do not put the plastic cover back on the needle. 12. Discard the syringe and needle directly into a sharps container, such as an empty plastic bottle with a cover. Throwing away supplies   Discard all used needles in a puncture-proof sharps disposal container. You can ask your local pharmacy about where you can get this kind of disposal container, or you can use an empty liquid laundry detergent bottle that has a cover.  Follow the disposal regulations for the area where you live. Do not use any syringe or needle more than one time.  Throw away empty vials in the regular trash. What questions should I ask my health care provider?  How often should I be taking insulin?  How often should I check my blood glucose?  What amount of insulin should I be taking at each time?  What are the side effects?  What should I do if my blood glucose is too high?  What should I do if my blood glucose is too low?  What should I do if I forget to take my insulin?  What number should I call if I have questions? Where can I get more information?  American Diabetes Association (ADA): www.diabetes.org  American Association of Diabetes Educators (AADE) Patient Resources: https://www.diabeteseducator.org/patient-resources This information is not intended to replace advice given to you by your  health care provider. Make sure you discuss any questions you have with your health care  provider. Document Released: 09/20/2015 Document Revised: 01/23/2016 Document Reviewed: 09/20/2015 Elsevier Interactive Patient Education  2017 [place] Program for diabetes testing supplies Medicare has a Agricultural consultant for diabetes testing supplies (like test strips and lancets). No matter where you live, you'll need to use a Medicare national Items ordered by phone, email, Internet, or mail, and delivered to your house by common carriers like the U.S. Postal Service, [employer], or [employer]. It doesn't include items bought from local [place].  If you don't want diabetes testing supplies delivered to your home, you can go to any local pharmacy or storefront supplier that's enrolled with Medicare and buy them there.  The [employer] doesn't require you to change your testing monitor. If you're happy with your current monitor, look for a mail-order contract supplier or local store that can provide the supplies you need for your monitor.  Note: If you switch suppliers, you might need to arrange to have your current prescription transferred or get a new prescription for testing supplies from your doctor. Plan ahead before you run out of supplies.  [facility] [address] [place] [address] [phone]  [facility] [place] [address] [phone]  [facility], [place] [address] [phone]  [facility] [place] [address] [phone]  [email or web address]    An after visit summary with all of these plans was given to the patient.

## 2017-05-10 NOTE — Progress Notes (Signed)
No chief complaint on file.   HPI  Past Medical History:  Diagnosis Date  . Acute diastolic heart failure ([place])   . CHF (congestive heart failure) ([place])   . Diabetes mellitus with nephropathy ([place]) 05/12/2012   Dr. Justin Mend follows  . Hemorrhoids 05/12/2012  . Hyperlipidemia   . Hypertension   . Lumbar spinal stenosis 05/03/2012   Mild with only right L4 nerve root encroachment, no neurogenic claudication   . Lumbar spondylosis 05/03/2012  . Meralgia paraesthetica 05/03/2012  . Meralgia paraesthetica 05/03/2012   On Lyrica which does improve pain.   Marland Kitchen Neuropathy in diabetes ([place]) 05/12/2012  . Obesity   . Pneumonia   . Retinopathy   . Sleep apnea    uses cpap  . Substance abuse   . Urinary incontinence 05/12/2012   Since the start of Sept. 2013     Current Outpatient Prescriptions  Medication Sig Dispense Refill  . amitriptyline (ELAVIL) 50 MG tablet TAKE ONE TABLET BY MOUTH EVERY NIGHT AT BEDTIME 30 tablet 3  . amLODipine (NORVASC) 10 MG tablet Take 1 tablet (10 mg total) by mouth daily. 90 tablet 3  . aspirin EC 81 MG tablet Take 1 tablet (81 mg total) by mouth daily. 90 tablet 3  . atorvastatin (LIPITOR) 80 MG tablet TAKE ONE (1) TABLET BY MOUTH EVERY DAY 30 tablet 3  . carvedilol (COREG) 25 MG tablet TAKE ONE (1) TABLET BY MOUTH TWO (2) TIMES DAILY WITH FOOD 60 tablet 3  . cetirizine (ZYRTEC) 10 MG tablet Take 1 tablet (10 mg total) by mouth daily. 30 tablet 11  . clotrimazole-betamethasone (LOTRISONE) cream Apply 1 application topically 2 (two) times daily. 30 g 2  . fluticasone (FLONASE) 50 MCG/ACT nasal spray Place 2 sprays into both nostrils daily. 16 g 3  . furosemide (LASIX) 80 MG tablet Take 1 tablet (80 mg total) by mouth 2 (two) times daily. Needs labs for refills. 60 tablet 0  . gabapentin (NEURONTIN) 300 MG capsule Take 1 capsule (300 mg total) by mouth 3 (three) times daily. 90 capsule 0  . glucose blood (ACCU-CHEK AVIVA) test strip Use as directed to check blood sugar  qid before meals. 100 each 0  . hydrALAZINE (APRESOLINE) 100 MG tablet Take 1 tablet (100 mg total) by mouth 3 (three) times daily. 90 tablet 3  . hydrOXYzine (ATARAX/VISTARIL) 25 MG tablet TAKE ONE (1) TABLET BY MOUTH 3 TIMES DAILY AS NEEDED FOR ITCHING 30 tablet 3  . insulin aspart (NOVOLOG FLEXPEN) 100 UNIT/ML FlexPen INJECT 0-60 UNITS INTO THE SKIN 3 TIMES DAILY WITH MEALS.  INJECT INTO THE SKIN AS DIRECTED BASED ON SLIDING SCALE 30 mL 3  . LANTUS SOLOSTAR 100 UNIT/ML Solostar Pen INJECT 45 UNITS SUBCUTANEOUSLY EVERY MORNING AND INJECT 25 UNITS EVERY NIGHT 30 mL 0  . metolazone (ZAROXOLYN) 5 MG tablet Take 1 tablet (5 mg total) by mouth daily. 30 tablet 0  . nicotine (NICODERM CQ) 21 mg/24hr patch Place 1 patch (21 mg total) onto the skin daily. 28 patch 4  . nitroGLYCERIN (NITROSTAT) 0.4 MG SL tablet Place 1 tablet (0.4 mg total) under the tongue every 5 (five) minutes as needed for chest pain. 25 tablet prn  . polyethylene glycol powder (GLYCOLAX/MIRALAX) powder Take 255 g by mouth daily. (Patient not taking: Reported on 02/10/2017) 255 g 3  . Polyvinyl Alcohol-Povidone (REFRESH OP) Place 2 drops into both eyes 3 (three) times daily as needed (dry eyes).    . Potassium Chloride ER 20 MEQ  TBCR 20 mEq by Per post-pyloric tube route 2 (two) times daily. (Patient taking differently: Take 20 mEq by mouth 2 (two) times daily. ) 60 tablet 1  . tetrahydrozoline-zinc (EYE DROPS ALLERGY RELIEF) 0.05-0.25 % ophthalmic solution Place 2 drops into both eyes 3 (three) times daily as needed. 15 mL 1  . urea (GORDONS UREA) 40 % ointment Apply topically as needed. (Patient not taking: Reported on 02/10/2017) 30 g 2  . verapamil (CALAN-SR) 120 MG CR tablet Take 1 tablet (120 mg total) by mouth at bedtime. 30 tablet 3   Current Facility-Administered Medications  Medication Dose Route Frequency Provider Last Rate Last Dose  . triamcinolone acetonide (KENALOG) 10 MG/ML injection 10 mg  10 mg Other Once Landis Martins, DPM        Allergies: No Known Allergies  Past Surgical History:  Procedure Laterality Date  . ABDOMINAL SURGERY     Abscess I&D 2/2 infected hair  . AV FISTULA PLACEMENT Left 02/06/2016   Procedure: LEFT ARM RADIOCEPHALIC ARTERIOVENOUS (AV) FISTULA CREATION;  Surgeon: Rosetta Posner, MD;  Location: [facility];  Service: Vascular;  Laterality: Left;  . COLONOSCOPY W/ POLYPECTOMY     pt to bring records  . COLONOSCOPY WITH PROPOFOL N/A 06/05/2016   Procedure: COLONOSCOPY WITH PROPOFOL;  Surgeon: Doran Stabler, MD;  Location: [facility];  Service: Gastroenterology;  Laterality: N/A;  . TONSILLECTOMY      Social History   Social History  . Marital status: Single    Spouse name: N/A  . Number of children: N/A  . Years of education: N/A   Occupational History  . dietary services [employer]   Social History Main Topics  . Smoking status: Current Every Day Smoker    Packs/day: 0.50    Years: 30.00    Types: Cigarettes  . Smokeless tobacco: Never Used     Comment: "Trying - hard to stop"  . Alcohol use No     Comment: " about 40 ounce beer per month."  down to a beer every 3 weeks  . Drug use: No     Comment: marijuana "laced with something"; today, stated "no" to question of illegal drug use 06/16/2016  . Sexual activity: Not on file   Other Topics Concern  . Not on file   Social History Narrative  . No narrative on file    ROS  Objective: There were no vitals filed for this visit.  Physical Exam  Assessment and Plan There are no diagnoses linked to this encounter.   Delores P [employer]

## 2017-05-10 NOTE — Patient Instructions (Addendum)
IF you received an x-ray today, you will receive an invoice from [facility]. Please contact [facility] at [phone] with questions or concerns regarding your invoice.   IF you received labwork today, you will receive an invoice from [facility]. Please contact [facility] at [phone] with questions or concerns regarding your invoice.   Our billing staff will not be able to assist you with questions regarding bills from these companies.  You will be contacted with the lab results as soon as they are available. The fastest way to get your results is to activate your My Chart account. Instructions are located on the last page of this paperwork. If you have not heard from [place] regarding the results in 2 weeks, please contact this office.     Insulin Injection Instructions, Single Insulin Dose, Adult A subcutaneous injection is a shot of medicine that is injected into the layer of fat between skin and muscle. People with type 1 diabetes must take insulin because their bodies do not make it. People with type 2 diabetes may need to take insulin. There are many different types of insulin. The type of insulin that you take may determine how many injections you give yourself and when you need to take the injections. Choosing a site for injection Insulin absorption varies from site to site. It is best to inject insulin within the same body area, using a different spot in that area for each injection. Do not inject the insulin in the same spot for each injection. There are five main areas that can be used for injecting. These areas include:  Abdomen. This is the preferred area.  Front of thigh.  Upper, outer side of thigh.  Back of upper arm.  Buttocks.  Using a syringe and vial: single insulin dose First, follow the steps for Getting Ready, then continue with the steps for Pushing Air Into the [place], then follow the steps for Filling the Syringe, and finish with the steps for  Injecting the Insulin. Getting Ready  1. Wash your hands with soap and water. If soap and water are not available, use hand sanitizer. 2. Check the expiration date and type of insulin. 3. If you are using CLEAR insulin, check to see that it is clear and free of clumps. 4. Gently roll the medicine bottle (vial) between your palms to warm it. Do not shake the vial. 5. Remove the plastic pop-top covering from the vial, if one is present. 6. Use an alcohol wipe to clean the rubber top of the vial. 7. Remove the plastic cover from the needle on the syringe. Do not let the needle touch anything. Pushing Air Into the [place]  1. Pull the plunger back to bring (draw up) air into the syringe. The amount of air should be the same as the number of units in a dose of single insulin. 2. While you keep the vial right-side up, poke the needle through the rubber top of the vial. Do not turn the vial upside down to do this. 3. Push the plunger in all the way. This pushes air into the vial. 4. Do not take the needle out of the vial yet. Filling the Syringe 1. With the needle still inserted in the vial, turn the vial upside down and hold it at eye level. 2. Slowly pull back on the plunger to draw up the desired number of insulin units into the syringe. 3. If you see air bubbles in the syringe, slowly move the  plunger up and down 2 or 3 times to get rid of them. 4. Pull back the plunger until the syringe is filled to the correct dose. 5. Remove the needle from the vial. Do not let the needle touch anything. Injecting the Insulin  1. Use an alcohol wipe to clean the site where you will be injecting the needle. Let the site air-dry. 2. Hold the syringe in your writing hand like a pencil. 3. Use your other hand to pinch and hold about an inch of skin. Do not directly touch the cleaned part of the skin. 4. Gently but quickly, put the needle straight into the skin. The needle should be at a 90-degree angle  (perpendicular) to the skin, as if to form the letter "L." ? For example, if you are giving an injection in the abdomen, the abdomen forms one "leg" of the "L" and the needle forms the other "leg" of the "L." 5. For adults who have a small amount of body fat, the needle may need to be injected at a 45-degree angle instead. Your health care provider will tell you if this is necessary. ? A 45-degree angle looks like the letter "V." 6. Push the needle in as far as it will go (to the hub). 7. When the needle is completely inserted into the skin, use the thumb of your writing hand to push the plunger down all the way to inject the insulin. 8. Let go of the skin that you are pinching. Continue to hold the syringe in place with your writing hand. 9. Wait 5 seconds, then pull the needle straight out of the skin. 10. Press and hold the alcohol wipe over the injection site until any bleeding stops. Do not rub the area. 11. Do not put the plastic cover back on the needle. 12. Discard the syringe and needle directly into a sharps container, such as an empty plastic bottle with a cover. Throwing away supplies   Discard all used needles in a puncture-proof sharps disposal container. You can ask your local pharmacy about where you can get this kind of disposal container, or you can use an empty liquid laundry detergent bottle that has a cover.  Follow the disposal regulations for the area where you live. Do not use any syringe or needle more than one time.  Throw away empty vials in the regular trash. What questions should I ask my health care provider?  How often should I be taking insulin?  How often should I check my blood glucose?  What amount of insulin should I be taking at each time?  What are the side effects?  What should I do if my blood glucose is too high?  What should I do if my blood glucose is too low?  What should I do if I forget to take my insulin?  What number should I call if  I have questions? Where can I get more information?  American Diabetes Association (ADA): www.diabetes.org  American Association of Diabetes Educators (AADE) Patient Resources: https://www.diabeteseducator.org/patient-resources This information is not intended to replace advice given to you by your health care provider. Make sure you discuss any questions you have with your health care provider. Document Released: 09/20/2015 Document Revised: 01/23/2016 Document Reviewed: 09/20/2015 Elsevier Interactive Patient Education  2017 [place] Program for diabetes testing supplies Medicare has a Agricultural consultant for diabetes testing supplies (like test strips and lancets). No matter where you live, you'll need to  use a Medicare national Items ordered by phone, email, Internet, or mail, and delivered to your house by common carriers like the U.S. Postal Service, [employer], or [employer]. It doesn't include items bought from local [place].  If you don't want diabetes testing supplies delivered to your home, you can go to any local pharmacy or storefront supplier that's enrolled with Medicare and buy them there.  The [employer] doesn't require you to change your testing monitor. If you're happy with your current monitor, look for a mail-order contract supplier or local store that can provide the supplies you need for your monitor.  Note: If you switch suppliers, you might need to arrange to have your current prescription transferred or get a new prescription for testing supplies from your doctor. Plan ahead before you run out of supplies.  [facility] [address] [place] [address] [phone]  [facility] [place] [address] [phone]  [facility], [place] [address] [phone]  [facility] [place] [address] [phone]  Medicare.gov

## 2017-05-11 LAB — LIPID PANEL
CHOLESTEROL TOTAL: 175 mg/dL (ref 100–199)
Chol/HDL Ratio: 5 ratio (ref 0.0–5.0)
HDL: 35 mg/dL — AB (ref >39)
LDL Calculated: 69 mg/dL (ref 0–99)
TRIGLYCERIDES: 353 mg/dL — AB (ref 0–149)
VLDL Cholesterol Cal: 71 mg/dL — ABNORMAL HIGH (ref 5–40)

## 2017-05-11 LAB — COMPREHENSIVE METABOLIC PANEL
A/G RATIO: 1.5 (ref 1.2–2.2)
ALBUMIN: 4.1 g/dL (ref 3.5–5.5)
ALT: 17 IU/L (ref 0–44)
AST: 15 IU/L (ref 0–40)
Alkaline Phosphatase: 125 IU/L — ABNORMAL HIGH (ref 39–117)
BILIRUBIN TOTAL: 0.2 mg/dL (ref 0.0–1.2)
BUN / CREAT RATIO: 18 (ref 9–20)
BUN: 59 mg/dL — ABNORMAL HIGH (ref 6–24)
CALCIUM: 9.8 mg/dL (ref 8.7–10.2)
CHLORIDE: 97 mmol/L (ref 96–106)
CO2: 27 mmol/L (ref 20–29)
Creatinine, Ser: 3.3 mg/dL (ref 0.76–1.27)
GFR, EST AFRICAN AMERICAN: 24 mL/min/{1.73_m2} — AB (ref >59)
GFR, EST NON AFRICAN AMERICAN: 20 mL/min/{1.73_m2} — AB (ref >59)
GLOBULIN, TOTAL: 2.7 g/dL (ref 1.5–4.5)
Glucose: 124 mg/dL — ABNORMAL HIGH (ref 65–99)
POTASSIUM: 3.8 mmol/L (ref 3.5–5.2)
Sodium: 145 mmol/L — ABNORMAL HIGH (ref 134–144)
TOTAL PROTEIN: 6.8 g/dL (ref 6.0–8.5)

## 2017-05-12 ENCOUNTER — Ambulatory Visit: Payer: Medicare HMO

## 2017-05-14 ENCOUNTER — Other Ambulatory Visit: Payer: Self-pay | Admitting: Family Medicine

## 2017-05-17 ENCOUNTER — Telehealth: Payer: Self-pay | Admitting: Family Medicine

## 2017-05-18 ENCOUNTER — Ambulatory Visit (INDEPENDENT_AMBULATORY_CARE_PROVIDER_SITE_OTHER): Payer: Medicare Other | Admitting: Orthotics

## 2017-05-18 DIAGNOSIS — B351 Tinea unguium: Secondary | ICD-10-CM

## 2017-05-18 DIAGNOSIS — M7751 Other enthesopathy of right foot: Secondary | ICD-10-CM

## 2017-05-18 DIAGNOSIS — L853 Xerosis cutis: Secondary | ICD-10-CM

## 2017-05-18 DIAGNOSIS — M779 Enthesopathy, unspecified: Secondary | ICD-10-CM

## 2017-05-18 DIAGNOSIS — M79675 Pain in left toe(s): Secondary | ICD-10-CM

## 2017-05-18 DIAGNOSIS — M2142 Flat foot [pes planus] (acquired), left foot: Secondary | ICD-10-CM

## 2017-05-18 DIAGNOSIS — H548 Legal blindness, as defined in USA: Secondary | ICD-10-CM

## 2017-05-18 DIAGNOSIS — Z01818 Encounter for other preprocedural examination: Secondary | ICD-10-CM | POA: Diagnosis not present

## 2017-05-18 DIAGNOSIS — E1142 Type 2 diabetes mellitus with diabetic polyneuropathy: Secondary | ICD-10-CM

## 2017-05-18 DIAGNOSIS — M2141 Flat foot [pes planus] (acquired), right foot: Secondary | ICD-10-CM

## 2017-05-18 DIAGNOSIS — B359 Dermatophytosis, unspecified: Secondary | ICD-10-CM

## 2017-05-18 DIAGNOSIS — M79674 Pain in right toe(s): Secondary | ICD-10-CM

## 2017-05-18 DIAGNOSIS — E119 Type 2 diabetes mellitus without complications: Secondary | ICD-10-CM

## 2017-05-18 DIAGNOSIS — M778 Other enthesopathies, not elsewhere classified: Secondary | ICD-10-CM

## 2017-05-18 NOTE — Telephone Encounter (Signed)
Please Advise

## 2017-05-20 MED ORDER — TETRAHYDROZOLINE-ZN SULFATE 0.05-0.25 % OP SOLN: 2.0000 [drp] | Freq: Three times a day (TID) | OPHTHALMIC | 1 refills | Status: DC | PRN

## 2017-05-20 MED ORDER — INSULIN ASPART 100 UNIT/ML FLEXPEN: PEN_INJECTOR | SUBCUTANEOUS | 3 refills | Status: DC

## 2017-05-20 MED ORDER — INSULIN GLARGINE 100 UNIT/ML SOLOSTAR PEN: PEN_INJECTOR | SUBCUTANEOUS | 3 refills | Status: DC

## 2017-05-20 MED ORDER — POLYVINYL ALCOHOL-POVIDONE PF 1.4-0.6 % OP SOLN: 2.0000 [drp] | Freq: Three times a day (TID) | OPHTHALMIC | 6 refills | Status: DC | PRN

## 2017-05-20 NOTE — Telephone Encounter (Signed)
Please notify the patient to have his kidney doctor fill his water pills and his hydralazine I filled his insulin and some of his blood pressure medications I am not comfortable filling his furosemide, metolazone and hydralazine

## 2017-05-20 NOTE — Telephone Encounter (Signed)
PATIENT STATES HE NEEDS DR. Nolon Rod TO CALL HIM BACK AS SOON AS POSSIBLE REGARDING HIS FUROSEMIDE AND A FEW OTHER MEDICATIONS THAT WERE NOT FILLED. HE SAID HIS DOCTOR AT [facility]. (DR. Woodfin WEBB) HAS HIM TAKING HIS FLUID PILL 80 MG IN THE MORNING AND 80 MG IN THE EVENING. HE SAID DR. STALLINGS DECREASED IT. HE WANTS TO STAY OUT OF THE HOSPITAL AND HE WANTS TO CONTINUE TO TAKE IT THE WAY DR. Justin Mend HAS PRESCRIBED. HE SAID HE NEEDS TO KEEP THE FLUID OFF OF HIM AND IT HAS WORKED FOR A YEAR AND A HALF. HE IS COMPLETELY OUT. BEST PHONE [phone] ([place]) PHARMACY CHOICE IS [facility]. [place]

## 2017-05-20 NOTE — Telephone Encounter (Signed)
Thank you for notifying the patient. He is well established with his Nephrologist who has already been managing these medications.

## 2017-05-20 NOTE — Telephone Encounter (Signed)
Spoke with patient advised dr Nolon Rod' advises him to have his kidney doctor write for his water pills, hydralazine and metolazone.  I advised she did refill his insulin and some of his blood pressure medications.  Advised dr Nolon Rod' not comfortable refilling his furosemide, metolazone and hydralazine.  Pt upset and cursing and says if he swells up from fluid he is going to sue somebody's ass and he was sick of this shit!

## 2017-05-24 ENCOUNTER — Encounter: Payer: Self-pay | Admitting: Family Medicine

## 2017-05-24 ENCOUNTER — Ambulatory Visit (INDEPENDENT_AMBULATORY_CARE_PROVIDER_SITE_OTHER): Payer: Medicare Other | Admitting: Family Medicine

## 2017-05-24 VITALS — BP 128/76 | HR 75 | Temp 98.7°F | Resp 17 | Ht 70.0 in | Wt 264.0 lb

## 2017-05-24 DIAGNOSIS — Z789 Other specified health status: Secondary | ICD-10-CM | POA: Diagnosis not present

## 2017-05-24 DIAGNOSIS — N184 Chronic kidney disease, stage 4 (severe): Secondary | ICD-10-CM | POA: Diagnosis not present

## 2017-05-24 DIAGNOSIS — I5030 Unspecified diastolic (congestive) heart failure: Secondary | ICD-10-CM

## 2017-05-24 DIAGNOSIS — H00012 Hordeolum externum right lower eyelid: Secondary | ICD-10-CM | POA: Diagnosis not present

## 2017-05-24 DIAGNOSIS — I110 Hypertensive heart disease with heart failure: Secondary | ICD-10-CM

## 2017-05-24 DIAGNOSIS — Z77120 Contact with and (suspected) exposure to mold (toxic): Secondary | ICD-10-CM | POA: Diagnosis not present

## 2017-05-24 DIAGNOSIS — N5203 Combined arterial insufficiency and corporo-venous occlusive erectile dysfunction: Secondary | ICD-10-CM

## 2017-05-24 MED ORDER — GLUCOSE BLOOD VI STRP: ORAL_STRIP | 11 refills | Status: DC

## 2017-05-24 NOTE — Patient Instructions (Addendum)
   IF you received an x-ray today, you will receive an invoice from [facility]. Please contact [facility] at [phone] with questions or concerns regarding your invoice.   IF you received labwork today, you will receive an invoice from [facility]. Please contact [facility] at [phone] with questions or concerns regarding your invoice.   Our billing staff will not be able to assist you with questions regarding bills from these companies.  You will be contacted with the lab results as soon as they are available. The fastest way to get your results is to activate your My Chart account. Instructions are located on the last page of this paperwork. If you have not heard from us regarding the results in 2 weeks, please contact this office.     Allergic Rhinitis Allergic rhinitis is when the mucous membranes in the nose respond to allergens. Allergens are particles in the air that cause your body to have an allergic reaction. This causes you to release allergic antibodies. Through a chain of events, these eventually cause you to release histamine into the blood stream. Although meant to protect the body, it is this release of histamine that causes your discomfort, such as frequent sneezing, congestion, and an itchy, runny nose. What are the causes? Seasonal allergic rhinitis (hay fever) is caused by pollen allergens that may come from grasses, trees, and weeds. Year-round allergic rhinitis (perennial allergic rhinitis) is caused by allergens such as house dust mites, pet dander, and mold spores. What are the signs or symptoms?  Nasal stuffiness (congestion).  Itchy, runny nose with sneezing and tearing of the eyes. How is this diagnosed? Your health care provider can help you determine the allergen or allergens that trigger your symptoms. If you and your health care provider are unable to determine the allergen, skin or blood testing may be used. Your health care provider will  diagnose your condition after taking your health history and performing a physical exam. Your health care provider may assess you for other related conditions, such as asthma, pink eye, or an ear infection. How is this treated? Allergic rhinitis does not have a cure, but it can be controlled by:  Medicines that block allergy symptoms. These may include allergy shots, nasal sprays, and oral antihistamines.  Avoiding the allergen. Hay fever may often be treated with antihistamines in pill or nasal spray forms. Antihistamines block the effects of histamine. There are over-the-counter medicines that may help with nasal congestion and swelling around the eyes. Check with your health care provider before taking or giving this medicine. If avoiding the allergen or the medicine prescribed do not work, there are many new medicines your health care provider can prescribe. Stronger medicine may be used if initial measures are ineffective. Desensitizing injections can be used if medicine and avoidance does not work. Desensitization is when a patient is given ongoing shots until the body becomes less sensitive to the allergen. Make sure you follow up with your health care provider if problems continue. Follow these instructions at home: It is not possible to completely avoid allergens, but you can reduce your symptoms by taking steps to limit your exposure to them. It helps to know exactly what you are allergic to so that you can avoid your specific triggers. Contact a health care provider if:  You have a fever.  You develop a cough that does not stop easily (persistent).  You have shortness of breath.  You start wheezing.  Symptoms interfere with normal daily activities.   This information is not intended to replace advice given to you by your health care provider. Make sure you discuss any questions you have with your health care provider. Document Released: 05/12/2001 Document Revised: 04/17/2016 Document  Reviewed: 04/24/2013 Elsevier Interactive Patient Education  2017 Elsevier Inc.  

## 2017-05-24 NOTE — Progress Notes (Signed)
Chief Complaint  Patient presents with  . mold exposure    possible mold exposure    HPI Exposure to Mold  Pt reports that he moved into a house in August 2018 and since moving in he noted moistures  He is concerned about mold  He has headaches, eye burning, shortness of breath He states that he has been coughing more than his usual He reports that he left overnight and went to church and hung out and noticed that his symptoms resolved He is taking his flonase and zyrtec without improvement  CKD and CHF He reports that his kidney doctor was prescribing lasix 80mg  two tablets twice a day He states that he also takes metolazone as well if he needs to  He states that he weighs every day and he is not retaining fluid and has not needed metolazone Lab Results  Component Value Date   CREATININE 3.30 (>) 05/10/2017  he is avoiding salty foods and sticking to his plan from nephrology   BP Readings from Last 3 Encounters:  05/24/17 128/76  05/10/17 (!) 148/84  02/10/17 (!) 145/89    Right eye stye He states that he has a stye on the right lower lid He states that he has been using neosporin in the lower eye lid No pain with movement of the eye  No vision changes  Erectile dysfunction Pt reports that he tried cialis and viagra He is unable to achieve or maintain his erection.  He reports that he uses the penile ring which helps for a while He states that he is sexually active with younger women   Past Medical History:  Diagnosis Date  . Acute diastolic heart failure ([place])   . CHF (congestive heart failure) ([place])   . Diabetes mellitus with nephropathy ([place]) 05/12/2012   Dr. Justin Mend follows  . Hemorrhoids 05/12/2012  . Hyperlipidemia   . Hypertension   . Lumbar spinal stenosis 05/03/2012   Mild with only right L4 nerve root encroachment, no neurogenic claudication   . Lumbar spondylosis 05/03/2012  . Meralgia paraesthetica 05/03/2012  . Meralgia paraesthetica 05/03/2012   On Lyrica  which does improve pain.   Marland Kitchen Neuropathy in diabetes ([place]) 05/12/2012  . Obesity   . Pneumonia   . Retinopathy   . Sleep apnea    uses cpap  . Substance abuse   . Urinary incontinence 05/12/2012   Since the start of Sept. 2013     Current Outpatient Prescriptions  Medication Sig Dispense Refill  . amitriptyline (ELAVIL) 50 MG tablet TAKE ONE TABLET BY MOUTH EVERY NIGHT AT BEDTIME 30 tablet 3  . amLODipine (NORVASC) 10 MG tablet Take 1 tablet (10 mg total) by mouth daily. 90 tablet 3  . aspirin EC 81 MG tablet Take 1 tablet (81 mg total) by mouth daily. 90 tablet 3  . atorvastatin (LIPITOR) 80 MG tablet TAKE ONE (1) TABLET BY MOUTH EVERY DAY 30 tablet 3  . Blood Glucose Monitoring Suppl (ACCU-CHEK AVIVA) device Use as instructed 1 each 0  . carvedilol (COREG) 25 MG tablet TAKE ONE (1) TABLET BY MOUTH TWO (2) TIMES DAILY WITH FOOD 60 tablet 3  . cetirizine (ZYRTEC) 10 MG tablet Take 1 tablet (10 mg total) by mouth daily. 30 tablet 11  . clotrimazole (LOTRIMIN AF) 1 % cream Apply 1 application topically 2 (two) times daily. 30 g 0  . fluticasone (FLONASE) 50 MCG/ACT nasal spray Place 2 sprays into both nostrils daily. 16 g 3  . furosemide (  LASIX) 80 MG tablet Take 0.5 tablets (40 mg total) by mouth 2 (two) times daily. OFFICE VISIT NEEDED FOR REFILLS 60 tablet 0  . gabapentin (NEURONTIN) 300 MG capsule Take 1 capsule (300 mg total) by mouth 3 (three) times daily. 90 capsule 0  . glucose blood (ACCU-CHEK AVIVA) test strip Use as directed to check blood sugar 5 times a day before meals and as needed. 100 each 11  . hydrALAZINE (APRESOLINE) 100 MG tablet Take 1 tablet (100 mg total) by mouth 3 (three) times daily. 90 tablet 3  . hydrOXYzine (ATARAX/VISTARIL) 25 MG tablet TAKE ONE (1) TABLET BY MOUTH 3 TIMES DAILY AS NEEDED FOR ITCHING 30 tablet 3  . insulin aspart (NOVOLOG FLEXPEN) 100 UNIT/ML FlexPen INJECT 0-60 UNITS INTO THE SKIN 3 TIMES DAILY WITH MEALS.  INJECT INTO THE SKIN AS DIRECTED BASED  ON SLIDING SCALE 30 mL 3  . Insulin Glargine (LANTUS SOLOSTAR) 100 UNIT/ML Solostar Pen INJECT 45 UNITS SUBCUTANEOUSLY EVERY MORNING AND INJECT 25 UNITS EVERY NIGHT 60 mL 3  . metolazone (ZAROXOLYN) 5 MG tablet Take 1 tablet (5 mg total) by mouth daily. 30 tablet 0  . nicotine (NICODERM CQ) 21 mg/24hr patch Place 1 patch (21 mg total) onto the skin daily. 28 patch 4  . nitroGLYCERIN (NITROSTAT) 0.4 MG SL tablet Place 1 tablet (0.4 mg total) under the tongue every 5 (five) minutes as needed for chest pain. 25 tablet prn  . polyethylene glycol powder (GLYCOLAX/MIRALAX) powder Take 255 g by mouth daily. 255 g 3  . Polyvinyl Alcohol-Povidone PF (REFRESH) 1.4-0.6 % SOLN Apply 2 drops to eye 3 (three) times daily as needed. 30 each 6  . Potassium Chloride ER 20 MEQ TBCR Take 20 mEq by mouth 2 (two) times daily. 60 tablet 6  . tetrahydrozoline-zinc (EYE DROPS ALLERGY RELIEF) 0.05-0.25 % ophthalmic solution Place 2 drops into both eyes 3 (three) times daily as needed. 15 mL 1  . urea (GORDONS UREA) 40 % ointment Apply topically as needed. 30 g 2  . verapamil (CALAN-SR) 120 MG CR tablet Take 1 tablet (120 mg total) by mouth at bedtime. 30 tablet 3   Current Facility-Administered Medications  Medication Dose Route Frequency Provider Last Rate Last Dose  . triamcinolone acetonide (KENALOG) 10 MG/ML injection 10 mg  10 mg Other Once Landis Martins, DPM        Allergies: No Known Allergies  Past Surgical History:  Procedure Laterality Date  . ABDOMINAL SURGERY     Abscess I&D 2/2 infected hair  . AV FISTULA PLACEMENT Left 02/06/2016   Procedure: LEFT ARM RADIOCEPHALIC ARTERIOVENOUS (AV) FISTULA CREATION;  Surgeon: Rosetta Posner, MD;  Location: [facility];  Service: Vascular;  Laterality: Left;  . COLONOSCOPY W/ POLYPECTOMY     pt to bring records  . COLONOSCOPY WITH PROPOFOL N/A 06/05/2016   Procedure: COLONOSCOPY WITH PROPOFOL;  Surgeon: Doran Stabler, MD;  Location: [facility];  Service:  Gastroenterology;  Laterality: N/A;  . TONSILLECTOMY      Social History   Social History  . Marital status: Single    Spouse name: N/A  . Number of children: N/A  . Years of education: N/A   Occupational History  . dietary services [employer]   Social History Main Topics  . Smoking status: Current Every Day Smoker    Packs/day: 0.50    Years: 30.00    Types: Cigarettes  . Smokeless tobacco: Never Used     Comment: "Trying - hard  to stop"  . Alcohol use No     Comment: " about 40 ounce beer per month."  down to a beer every 3 weeks  . Drug use: No     Comment: marijuana "laced with something"; today, stated "no" to question of illegal drug use 06/16/2016  . Sexual activity: Not Asked   Other Topics Concern  . None   Social History Narrative  . None    Review of Systems  Constitutional: Negative for chills and fever.  Eyes: Negative for blurred vision and double vision.  Respiratory: Positive for cough and shortness of breath. Negative for hemoptysis, sputum production and wheezing.   Cardiovascular: Negative for chest pain, palpitations and leg swelling.  Gastrointestinal: Negative for abdominal pain, nausea and vomiting.  Genitourinary: Negative for dysuria, frequency and urgency.  Skin: Negative for itching and rash.  Neurological: Positive for headaches. Negative for dizziness and tingling.    Objective: Vitals:   05/24/17 0927  BP: 128/76  Pulse: 75  Resp: 17  Temp: 98.7 F (37.1 C)  TempSrc: Oral  SpO2: 99%  Weight: 264 lb (119.7 kg)  Height: 5\' 10"  (1.778 m)    Physical Exam  Constitutional: He is oriented to person, place, and time. He appears well-developed and well-nourished.  HENT:  Head: Normocephalic and atraumatic.  Eyes: Conjunctivae and EOM are normal.  Neck: Normal range of motion. No JVD present.  Cardiovascular: Normal rate, regular rhythm and normal heart sounds.   No murmur heard. Pulmonary/Chest: Effort normal and breath sounds  normal. No respiratory distress. He has no wheezes. He has no rales.  Neurological: He is alert and oriented to person, place, and time.  Skin: Skin is warm. No erythema.  Psychiatric: He has a normal mood and affect. His behavior is normal. Judgment and thought content normal.    Assessment and Plan [place] was seen today for mold exposure.  Diagnoses and all orders for this visit:  Mold exposure- continue antihistamine Change environment Letter provided with recommendations for landlord  Chronic kidney disease, stage IV (severe) ([place])- reviewed renal function and discussed with pt that he should follow up with Nephrology to verify accurate dosing instructions of his water pills  Diastolic heart failure secondary to hypertension ([place])- continue diuretics Call nephrology to verify dose  Medically complex patient- discussed the complexity of his health care Given his long standing hypertension, now with CHF and CKD that his fluid balance is important Clarity of the overall plan for his health care Also that his Erectile dysfunction is multifactorial  Hordeolum externum of right lower eyelid- apply warm compresses  Combined arterial insufficiency and corporo-venous occlusive erectile dysfunction- discussed follow up with Urology -     Ambulatory referral to Urology  A total of 45 minutes were spent face-to-face with the patient during this encounter and over half of that time was spent on counseling and coordination of care.    [place]

## 2017-05-27 ENCOUNTER — Encounter (HOSPITAL_COMMUNITY): Payer: Self-pay

## 2017-05-27 ENCOUNTER — Emergency Department (HOSPITAL_COMMUNITY)
Admission: EM | Admit: 2017-05-27 | Discharge: 2017-05-27 | Disposition: A | Discharge status: Home / Self Care | Payer: Medicare Other | Attending: Emergency Medicine | Admitting: Emergency Medicine

## 2017-05-27 DIAGNOSIS — Z5321 Procedure and treatment not carried out due to patient leaving prior to being seen by health care provider: Secondary | ICD-10-CM | POA: Insufficient documentation

## 2017-05-27 DIAGNOSIS — H5711 Ocular pain, right eye: Secondary | ICD-10-CM | POA: Insufficient documentation

## 2017-05-27 NOTE — ED Notes (Signed)
Pt called for v/s recheck, no response from lobby 
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
## 2017-05-27 NOTE — ED Notes (Signed)
Pt called for v/s x2, no response from lobby

## 2017-05-27 NOTE — Progress Notes (Signed)

## 2017-06-01 ENCOUNTER — Ambulatory Visit: Payer: Medicare Other | Admitting: Sports Medicine

## 2017-06-04 ENCOUNTER — Other Ambulatory Visit: Payer: Self-pay | Admitting: Family Medicine

## 2017-06-04 DIAGNOSIS — R059 Cough, unspecified: Secondary | ICD-10-CM

## 2017-06-04 DIAGNOSIS — R05 Cough: Secondary | ICD-10-CM

## 2017-06-06 ENCOUNTER — Other Ambulatory Visit: Payer: Self-pay | Admitting: Family Medicine

## 2017-06-06 DIAGNOSIS — E0865 Diabetes mellitus due to underlying condition with hyperglycemia: Secondary | ICD-10-CM

## 2017-06-06 DIAGNOSIS — IMO0002 Reserved for concepts with insufficient information to code with codable children: Secondary | ICD-10-CM

## 2017-06-06 DIAGNOSIS — E083499 Diabetes mellitus due to underlying condition with severe nonproliferative diabetic retinopathy without macular edema, unspecified eye: Secondary | ICD-10-CM

## 2017-06-06 DIAGNOSIS — Z794 Long term (current) use of insulin: Secondary | ICD-10-CM

## 2017-06-06 DIAGNOSIS — N184 Chronic kidney disease, stage 4 (severe): Secondary | ICD-10-CM

## 2017-06-07 ENCOUNTER — Other Ambulatory Visit: Payer: Self-pay | Admitting: Family Medicine

## 2017-06-07 DIAGNOSIS — N184 Chronic kidney disease, stage 4 (severe): Secondary | ICD-10-CM | POA: Diagnosis not present

## 2017-06-07 DIAGNOSIS — L729 Follicular cyst of the skin and subcutaneous tissue, unspecified: Secondary | ICD-10-CM | POA: Diagnosis not present

## 2017-06-07 DIAGNOSIS — H548 Legal blindness, as defined in USA: Secondary | ICD-10-CM | POA: Diagnosis not present

## 2017-06-07 DIAGNOSIS — R05 Cough: Secondary | ICD-10-CM

## 2017-06-07 DIAGNOSIS — K219 Gastro-esophageal reflux disease without esophagitis: Secondary | ICD-10-CM | POA: Diagnosis not present

## 2017-06-07 DIAGNOSIS — M1990 Unspecified osteoarthritis, unspecified site: Secondary | ICD-10-CM | POA: Diagnosis not present

## 2017-06-07 DIAGNOSIS — E1140 Type 2 diabetes mellitus with diabetic neuropathy, unspecified: Secondary | ICD-10-CM | POA: Diagnosis not present

## 2017-06-07 DIAGNOSIS — I130 Hypertensive heart and chronic kidney disease with heart failure and stage 1 through stage 4 chronic kidney disease, or unspecified chronic kidney disease: Secondary | ICD-10-CM | POA: Diagnosis not present

## 2017-06-07 DIAGNOSIS — I5032 Chronic diastolic (congestive) heart failure: Secondary | ICD-10-CM | POA: Diagnosis not present

## 2017-06-07 DIAGNOSIS — G4733 Obstructive sleep apnea (adult) (pediatric): Secondary | ICD-10-CM | POA: Diagnosis not present

## 2017-06-07 DIAGNOSIS — R059 Cough, unspecified: Secondary | ICD-10-CM

## 2017-06-07 DIAGNOSIS — J449 Chronic obstructive pulmonary disease, unspecified: Secondary | ICD-10-CM | POA: Diagnosis not present

## 2017-06-07 DIAGNOSIS — L720 Epidermal cyst: Secondary | ICD-10-CM | POA: Diagnosis not present

## 2017-06-07 DIAGNOSIS — E1122 Type 2 diabetes mellitus with diabetic chronic kidney disease: Secondary | ICD-10-CM | POA: Diagnosis not present

## 2017-06-09 ENCOUNTER — Telehealth: Payer: Self-pay | Admitting: Family Medicine

## 2017-06-09 ENCOUNTER — Ambulatory Visit: Payer: Medicare Other | Admitting: Family Medicine

## 2017-06-09 NOTE — Telephone Encounter (Signed)
Pt is checking on status of a refill request for flonase , hydroxyzine and all of his of his blood pressure related medication   Best number [phone]

## 2017-06-11 ENCOUNTER — Telehealth: Payer: Self-pay

## 2017-06-11 DIAGNOSIS — R05 Cough: Secondary | ICD-10-CM

## 2017-06-11 DIAGNOSIS — R059 Cough, unspecified: Secondary | ICD-10-CM

## 2017-06-11 NOTE — Telephone Encounter (Signed)
Pt is requesting refill of hydralazine and flonase. I do not see hydralazine listed as a continued medication in the chart and it was originally filled by [facility]. Pt is upset because he requested this before but what he actually requested was hydroxyzine because he did not know the difference. Please advise.

## 2017-06-13 MED ORDER — HYDRALAZINE HCL 100 MG PO TABS: 100.0000 mg | ORAL_TABLET | Freq: Three times a day (TID) | ORAL | 0 refills | Status: DC

## 2017-06-13 MED ORDER — METOLAZONE 5 MG PO TABS: 5.0000 mg | ORAL_TABLET | Freq: Every day | ORAL | 1 refills | Status: DC

## 2017-06-13 MED ORDER — FLUTICASONE PROPIONATE 50 MCG/ACT NA SUSP
2.0000 | Freq: Every day | NASAL | 3 refills | Status: DC
Start: 2017-06-13 — End: 2017-06-19

## 2017-06-13 NOTE — Addendum Note (Signed)
Addended by: Delia Chimes A on: 06/13/2017 10:26 PM   Modules accepted: Orders

## 2017-06-13 NOTE — Telephone Encounter (Addendum)
Please let the patient know that his bp meds hydralazine and metolazone were sent in.  flonase was also called in.  ZS

## 2017-06-14 ENCOUNTER — Other Ambulatory Visit: Payer: Self-pay | Admitting: Family Medicine

## 2017-06-14 ENCOUNTER — Ambulatory Visit: Payer: Medicare Other | Admitting: Family Medicine

## 2017-06-14 NOTE — Telephone Encounter (Signed)
Please advise/refill medication: Flonase, hydroxyzine, and all other b/p medications  Pt will be in office today for 10 am appoinment

## 2017-06-15 DIAGNOSIS — Z961 Presence of intraocular lens: Secondary | ICD-10-CM | POA: Diagnosis not present

## 2017-06-15 DIAGNOSIS — H4311 Vitreous hemorrhage, right eye: Secondary | ICD-10-CM | POA: Diagnosis not present

## 2017-06-15 DIAGNOSIS — H2511 Age-related nuclear cataract, right eye: Secondary | ICD-10-CM | POA: Diagnosis not present

## 2017-06-15 DIAGNOSIS — E113592 Type 2 diabetes mellitus with proliferative diabetic retinopathy without macular edema, left eye: Secondary | ICD-10-CM | POA: Diagnosis not present

## 2017-06-15 DIAGNOSIS — H1132 Conjunctival hemorrhage, left eye: Secondary | ICD-10-CM | POA: Diagnosis not present

## 2017-06-16 DIAGNOSIS — H53131 Sudden visual loss, right eye: Secondary | ICD-10-CM | POA: Diagnosis not present

## 2017-06-16 DIAGNOSIS — H4311 Vitreous hemorrhage, right eye: Secondary | ICD-10-CM | POA: Diagnosis not present

## 2017-06-16 DIAGNOSIS — E113512 Type 2 diabetes mellitus with proliferative diabetic retinopathy with macular edema, left eye: Secondary | ICD-10-CM | POA: Diagnosis not present

## 2017-06-16 DIAGNOSIS — Z01818 Encounter for other preprocedural examination: Secondary | ICD-10-CM | POA: Diagnosis not present

## 2017-06-16 DIAGNOSIS — H3582 Retinal ischemia: Secondary | ICD-10-CM | POA: Diagnosis not present

## 2017-06-16 DIAGNOSIS — H35372 Puckering of macula, left eye: Secondary | ICD-10-CM | POA: Diagnosis not present

## 2017-06-17 ENCOUNTER — Telehealth: Payer: Self-pay | Admitting: Family Medicine

## 2017-06-17 NOTE — Telephone Encounter (Signed)
PT NEED A 90 DAY REFILL ON ALL OF HIS MEDICINES HE WILL BE MOVING TO [place] TO STAY WITH FAMILY UNTIL THE HOUSING DEVELOPMENT FIND SOMETHING HERE FOR HIM HIS [place] PUT HIM OUT BECAUSE OF THE MOLD EXPOSER FROM LAST OV PLEASE RESPOND

## 2017-06-17 NOTE — Telephone Encounter (Signed)
Pt calling requesting all medications that dr Nolon Rod writes for now have to be written for 90 day supply instead of 30 day supply.  Pt advises he is being put of his apartment and going to stay with relatives in [place] until he can find a new place in [place].  Pt says he has 90 day supply for gabapentin and hydralazine.  Advised pt to schedule appt with dr Nolon Rod to address rx issues.  Pt coming to see [facility] on 06/19/17 at 9:40 am. And will address pt concerns at visit.  Pt agreeable. Dgaddy, CMA

## 2017-06-19 ENCOUNTER — Ambulatory Visit (INDEPENDENT_AMBULATORY_CARE_PROVIDER_SITE_OTHER): Payer: Medicare Other | Admitting: Family Medicine

## 2017-06-19 ENCOUNTER — Encounter: Payer: Self-pay | Admitting: Family Medicine

## 2017-06-19 VITALS — BP 130/78 | HR 75 | Temp 99.1°F | Resp 17 | Ht 70.0 in | Wt 266.6 lb

## 2017-06-19 DIAGNOSIS — Z5181 Encounter for therapeutic drug level monitoring: Secondary | ICD-10-CM | POA: Diagnosis not present

## 2017-06-19 DIAGNOSIS — M5410 Radiculopathy, site unspecified: Secondary | ICD-10-CM | POA: Diagnosis not present

## 2017-06-19 DIAGNOSIS — E1136 Type 2 diabetes mellitus with diabetic cataract: Secondary | ICD-10-CM

## 2017-06-19 DIAGNOSIS — G4733 Obstructive sleep apnea (adult) (pediatric): Secondary | ICD-10-CM

## 2017-06-19 DIAGNOSIS — H53452 Other localized visual field defect, left eye: Secondary | ICD-10-CM

## 2017-06-19 DIAGNOSIS — R05 Cough: Secondary | ICD-10-CM | POA: Diagnosis not present

## 2017-06-19 DIAGNOSIS — I10 Essential (primary) hypertension: Secondary | ICD-10-CM

## 2017-06-19 DIAGNOSIS — N184 Chronic kidney disease, stage 4 (severe): Secondary | ICD-10-CM

## 2017-06-19 DIAGNOSIS — E1142 Type 2 diabetes mellitus with diabetic polyneuropathy: Secondary | ICD-10-CM

## 2017-06-19 DIAGNOSIS — I110 Hypertensive heart disease with heart failure: Secondary | ICD-10-CM

## 2017-06-19 DIAGNOSIS — Z789 Other specified health status: Secondary | ICD-10-CM

## 2017-06-19 DIAGNOSIS — Z720 Tobacco use: Secondary | ICD-10-CM

## 2017-06-19 DIAGNOSIS — E11311 Type 2 diabetes mellitus with unspecified diabetic retinopathy with macular edema: Secondary | ICD-10-CM

## 2017-06-19 DIAGNOSIS — R059 Cough, unspecified: Secondary | ICD-10-CM

## 2017-06-19 DIAGNOSIS — I5030 Unspecified diastolic (congestive) heart failure: Secondary | ICD-10-CM

## 2017-06-19 MED ORDER — POTASSIUM CHLORIDE ER 20 MEQ PO TBCR: 20.0000 meq | EXTENDED_RELEASE_TABLET | Freq: Two times a day (BID) | ORAL | 3 refills | Status: DC

## 2017-06-19 MED ORDER — VERAPAMIL HCL ER 120 MG PO TBCR: 120.0000 mg | EXTENDED_RELEASE_TABLET | Freq: Every day | ORAL | 1 refills | Status: DC

## 2017-06-19 MED ORDER — AMITRIPTYLINE HCL 50 MG PO TABS: 50.0000 mg | ORAL_TABLET | Freq: Every day | ORAL | 1 refills | Status: DC

## 2017-06-19 MED ORDER — FLUTICASONE PROPIONATE 50 MCG/ACT NA SUSP: 2.0000 | Freq: Every day | NASAL | 3 refills | Status: DC

## 2017-06-19 MED ORDER — POLYVINYL ALCOHOL-POVIDONE PF 1.4-0.6 % OP SOLN: 2.0000 [drp] | Freq: Three times a day (TID) | OPHTHALMIC | 6 refills | Status: DC | PRN

## 2017-06-19 MED ORDER — CETIRIZINE HCL 10 MG PO TABS: 10.0000 mg | ORAL_TABLET | Freq: Every day | ORAL | 3 refills | Status: DC

## 2017-06-19 MED ORDER — TETRAHYDROZOLINE-ZN SULFATE 0.05-0.25 % OP SOLN: 2.0000 [drp] | Freq: Three times a day (TID) | OPHTHALMIC | 3 refills | Status: DC | PRN

## 2017-06-19 MED ORDER — CARVEDILOL 25 MG PO TABS: ORAL_TABLET | ORAL | 1 refills | Status: DC

## 2017-06-19 MED ORDER — ATORVASTATIN CALCIUM 80 MG PO TABS: ORAL_TABLET | ORAL | 3 refills | Status: DC

## 2017-06-19 NOTE — Progress Notes (Signed)
Chief Complaint  Patient presents with  . Medication Refill    90 day supply on meds except gabepentin, hydralazine, metolazone, furosemide,   . check face    surgery at [facility] for cyst removal on face    HPI   Pt will be moving to [place] short term due to mold in his apartment He is not sure when he will be back but would like medication refills so that he does not run out He will be in [place] for a whole but hopes to return to his primary care and specialists  Kidney disease Pt has CKD stage IV He is a smoker and has a history of substance abuse He is not on dialysis but sees nephrology He is on lasix and metolazole  Heart failure Pt reports that she has diastolic heart failure He is on multiple meds diurectics -metolazole and furosemide He is also on coreg and amlodipine as well as hydralazine with verapamil He weighs himself daily He is on a statin  Erectile Dysfunction He cannot maintain an erection  He feels lonely and does not have a sexual partner  He "likes them young" and feels like he cannot "please them" He s nott currently on anything  He has a scripts for nitroglycerine  Diabetes with multiple complications - neuropathy, nephropathy, retinopathy Lab Results  Component Value Date   HGBA1C 9.1 05/10/2017   He is on insulin  He denies hypoglycemia He adheres to ADA diet some of the time  Allergies and COPD Pt reports that he has been having continuous congestion and watery eyes He also has dry itchy skin He is on multiple eye drops, topical steroid as well as anti-histmaines  He still has wheezing    Past Medical History:  Diagnosis Date  . Acute diastolic heart failure ([place])   . CHF (congestive heart failure) ([place])   . Diabetes mellitus with nephropathy ([place]) 05/12/2012   Dr. Justin Mend follows  . Hemorrhoids 05/12/2012  . Hyperlipidemia   . Hypertension   . Lumbar spinal stenosis 05/03/2012   Mild with only right L4 nerve root encroachment,  no neurogenic claudication   . Lumbar spondylosis 05/03/2012  . Meralgia paraesthetica 05/03/2012  . Meralgia paraesthetica 05/03/2012   On Lyrica which does improve pain.   Marland Kitchen Neuropathy in diabetes ([place]) 05/12/2012  . Obesity   . Pneumonia   . Retinopathy   . Sleep apnea    uses cpap  . Substance abuse ([place])   . Urinary incontinence 05/12/2012   Since the start of Sept. 2013     Current Outpatient Prescriptions  Medication Sig Dispense Refill  . amitriptyline (ELAVIL) 50 MG tablet Take 1 tablet (50 mg total) by mouth at bedtime. 90 tablet 1  . amLODipine (NORVASC) 10 MG tablet Take 1 tablet (10 mg total) by mouth daily. 90 tablet 3  . aspirin EC 81 MG tablet Take 1 tablet (81 mg total) by mouth daily. 90 tablet 3  . atorvastatin (LIPITOR) 80 MG tablet TAKE ONE (1) TABLET BY MOUTH EVERY DAY 90 tablet 3  . Blood Glucose Monitoring Suppl (ACCU-CHEK AVIVA) device Use as instructed 1 each 0  . carvedilol (COREG) 25 MG tablet TAKE ONE (1) TABLET BY MOUTH TWO (2) TIMES DAILY WITH FOOD 180 tablet 1  . cetirizine (ZYRTEC) 10 MG tablet Take 1 tablet (10 mg total) by mouth daily. 90 tablet 3  . clotrimazole (LOTRIMIN AF) 1 % cream Apply 1 application topically 2 (two) times daily. 30 g  0  . fluticasone (FLONASE) 50 MCG/ACT nasal spray Place 2 sprays into both nostrils daily. 48 g 3  . furosemide (LASIX) 80 MG tablet Take 0.5 tablets (40 mg total) by mouth 2 (two) times daily. OFFICE VISIT NEEDED FOR REFILLS 60 tablet 0  . gabapentin (NEURONTIN) 300 MG capsule TAKE 1 CAPSULE BY MOUTH THREE TIMES A DAY 270 capsule 1  . glucose blood (ACCU-CHEK AVIVA) test strip Use as directed to check blood sugar 5 times a day before meals and as needed. 400 each 11  . hydrALAZINE (APRESOLINE) 100 MG tablet Take 1 tablet (100 mg total) by mouth 3 (three) times daily. 270 tablet 0  . hydrOXYzine (ATARAX/VISTARIL) 25 MG tablet TAKE ONE (1) TABLET BY MOUTH 3 TIMES DAILY AS NEEDED FOR ITCHING 30 tablet 3  . insulin aspart  (NOVOLOG FLEXPEN) 100 UNIT/ML FlexPen INJECT 0-60 UNITS INTO THE SKIN 3 TIMES DAILY WITH MEALS.  INJECT INTO THE SKIN AS DIRECTED BASED ON SLIDING SCALE 30 mL 3  . Insulin Glargine (LANTUS SOLOSTAR) 100 UNIT/ML Solostar Pen INJECT 45 UNITS SUBCUTANEOUSLY EVERY MORNING AND INJECT 25 UNITS EVERY NIGHT 60 mL 3  . metolazone (ZAROXOLYN) 5 MG tablet Take 1 tablet (5 mg total) by mouth daily. 90 tablet 1  . nicotine (NICODERM CQ) 21 mg/24hr patch Place 1 patch (21 mg total) onto the skin daily. 28 patch 4  . nitroGLYCERIN (NITROSTAT) 0.4 MG SL tablet Place 1 tablet (0.4 mg total) under the tongue every 5 (five) minutes as needed for chest pain. 25 tablet prn  . polyethylene glycol powder (GLYCOLAX/MIRALAX) powder Take 255 g by mouth daily. 255 g 3  . Polyvinyl Alcohol-Povidone PF (REFRESH) 1.4-0.6 % SOLN Apply 2 drops to eye 3 (three) times daily as needed. 90 each 6  . Potassium Chloride ER 20 MEQ TBCR Take 20 mEq by mouth 2 (two) times daily. 180 tablet 3  . tetrahydrozoline-zinc (EYE DROPS ALLERGY RELIEF) 0.05-0.25 % ophthalmic solution Place 2 drops into both eyes 3 (three) times daily as needed. 30 mL 3  . urea (GORDONS UREA) 40 % ointment Apply topically as needed. 30 g 2  . verapamil (CALAN-SR) 120 MG CR tablet Take 1 tablet (120 mg total) by mouth at bedtime. 90 tablet 1   Current Facility-Administered Medications  Medication Dose Route Frequency Provider Last Rate Last Dose  . triamcinolone acetonide (KENALOG) 10 MG/ML injection 10 mg  10 mg Other Once Landis Martins, DPM        Allergies: No Known Allergies  Past Surgical History:  Procedure Laterality Date  . ABDOMINAL SURGERY     Abscess I&D 2/2 infected hair  . AV FISTULA PLACEMENT Left 02/06/2016   Procedure: LEFT ARM RADIOCEPHALIC ARTERIOVENOUS (AV) FISTULA CREATION;  Surgeon: Rosetta Posner, MD;  Location: [facility];  Service: Vascular;  Laterality: Left;  . COLONOSCOPY W/ POLYPECTOMY     pt to bring records  . COLONOSCOPY WITH PROPOFOL  N/A 06/05/2016   Procedure: COLONOSCOPY WITH PROPOFOL;  Surgeon: Doran Stabler, MD;  [facility];  Service: Gastroenterology;  Laterality: N/A;  . TONSILLECTOMY      Social History   Social History  . Marital status: Single    Spouse name: N/A  . Number of children: N/A  . Years of education: N/A   Occupational History  . dietary services    Social History Main Topics  . Smoking status: Current Every Day Smoker    Packs/day: 0.50    Years: 30.00  Types: Cigarettes  . Smokeless tobacco: Never Used     Comment: "Trying - hard to stop"  . Alcohol use No     Comment: " about 40 ounce beer per month."  down to a beer every 3 weeks  . Drug use: No     Comment: marijuana "laced with something"; today, stated "no" to question of illegal drug use 06/16/2016  . Sexual activity: Not Asked   Other Topics Concern  . None   Social History Narrative  . None    Review of Systems  Constitutional: Negative for chills, fever and weight loss.  HENT: Positive for congestion. Negative for hearing loss, sinus pain and tinnitus.        Severe itching of ears, nose and throat  Eyes: Positive for blurred vision and double vision.  Respiratory: Positive for wheezing. Negative for cough and shortness of breath.        Severe allergies  Cardiovascular: Negative for chest pain, palpitations and leg swelling.       Weighs himself due to the CHF and has not noted any weight of greater than 2-3 pounds  Gastrointestinal: Negative for abdominal pain, constipation, diarrhea, nausea and vomiting.  Genitourinary: Negative for dysuria, frequency and urgency.  Musculoskeletal: Positive for joint pain. Negative for myalgias.       Chronic bilateral knee pain  Skin: Positive for itching and rash.       Chronic hyperpigmentation between fingers that itch and does not get better with topical steroids  Neurological: Negative for dizziness, tingling and headaches.    Psychiatric/Behavioral: Negative for hallucinations. The patient is not nervous/anxious and does not have insomnia.        Emotional stress and loneliness    Objective: Vitals:   06/19/17 0959  BP: 130/78  Pulse: 75  Resp: 17  Temp: 99.1 F (37.3 C)  TempSrc: Oral  SpO2: 95%  Weight: 266 lb 9.6 oz (120.9 kg)  Height: 5\' 10"  (1.778 m)    Physical Exam  Constitutional: He is oriented to person, place, and time. He appears well-developed and well-nourished.  HENT:  Head: Normocephalic and atraumatic.  Right Ear: External ear normal.  Left Ear: External ear normal.  Mouth/Throat: Oropharynx is clear and moist.  Eyes: Conjunctivae and EOM are normal.  Neck: Normal range of motion. No JVD present. No thyromegaly present.  Cardiovascular: Normal rate, regular rhythm and normal heart sounds.   No murmur heard. Pulmonary/Chest: Effort normal. No stridor. No respiratory distress. He has wheezes.  Persistent scant wheezing  Abdominal: Soft. Bowel sounds are normal. He exhibits no distension.  Musculoskeletal: Normal range of motion. He exhibits no edema.  Neurological: He is alert and oriented to person, place, and time.  Skin: Skin is warm. Capillary refill takes less than 2 seconds.  Scars on face from lanced boils that are showing good granulation tissue  Psychiatric: He has a normal mood and affect. His behavior is normal. Judgment and thought content normal.    Assessment and Plan Anthonymichael was seen today for medication refill and check face.  Diagnoses and all orders for this visit:  Encounter for medication monitoring Medically complex patient  Diabetic peripheral neuropathy associated with type 2 diabetes mellitus ([place])- continue gabapentin and amitriptyline  Essential hypertension, malignant- requiring multiple medications for cardiology  Diastolic heart failure secondary to hypertension ([place])- discussed continuing diuresis and salt restriction and continue weighing  daily  Cough- continue allergy medications -     fluticasone (FLONASE) 50 MCG/ACT nasal  spray; Place 2 sprays into both nostrils daily.  Decreased peripheral vision of left eye- continue to use safety precautions  Tobacco abuse- discussed that he has to find other hobbies. He is current smoking out of boredome  Chronic kidney disease, stage IV (severe) ([place])- continue with Nephrology  Radiculopathy with lower extremity symptoms- continue foot care and stretching  Diabetic macular edema of left eye, with cataract, associated with type 2 diabetes mellitus ([place])- continue with Ophthalmology  Obstructive sleep apnea- stable but continues to have congestion due to allergies  Other orders -     carvedilol (COREG) 25 MG tablet; TAKE ONE (1) TABLET BY MOUTH TWO (2) TIMES DAILY WITH FOOD -     atorvastatin (LIPITOR) 80 MG tablet; TAKE ONE (1) TABLET BY MOUTH EVERY DAY -     amitriptyline (ELAVIL) 50 MG tablet; Take 1 tablet (50 mg total) by mouth at bedtime. -     verapamil (CALAN-SR) 120 MG CR tablet; Take 1 tablet (120 mg total) by mouth at bedtime. -     cetirizine (ZYRTEC) 10 MG tablet; Take 1 tablet (10 mg total) by mouth daily. -     Polyvinyl Alcohol-Povidone PF (REFRESH) 1.4-0.6 % SOLN; Apply 2 drops to eye 3 (three) times daily as needed. -     tetrahydrozoline-zinc (EYE DROPS ALLERGY RELIEF) 0.05-0.25 % ophthalmic solution; Place 2 drops into both eyes 3 (three) times daily as needed. -     Potassium Chloride ER 20 MEQ TBCR; Take 20 mEq by mouth 2 (two) times daily.  A total of 55 minutes were spent face-to-face with the patient during this encounter and over half of that time was spent on counseling and coordination of care.    [place]

## 2017-06-19 NOTE — Patient Instructions (Addendum)
1. Establish with a local clinic either affiliated with [facility], [place], [name], [place]  These systems have Epic and your records can be reviewed 2. Pick up your refills 3. Keep all your appointments with your specialists      IF you received an x-ray today, you will receive an invoice from [facility]. Please contact [facility] at [phone] with questions or concerns regarding your invoice.   IF you received labwork today, you will receive an invoice from [facility]. Please contact [facility] at [phone] with questions or concerns regarding your invoice.   Our billing staff will not be able to assist you with questions regarding bills from these companies.  You will be contacted with the lab results as soon as they are available. The fastest way to get your results is to activate your My Chart account. Instructions are located on the last page of this paperwork. If you have not heard from [place] regarding the results in 2 weeks, please contact this office.     Chronic Kidney Disease, Adult Chronic kidney disease (CKD) occurs when the kidneys are damaged during a period of 3 or more months. The kidneys are two organs that do many important jobs in the body, which include:  Removing wastes and extra fluids from the blood.  Making hormones that maintain the amount of fluid in your tissues and blood vessels.  Maintaining the right amount of fluids and chemicals in the body.  A small amount of kidney damage may not cause problems, but a large amount of damage may make it difficult or impossible for the kidneys to work the way they should. If steps are not taken to slow down the kidney damage or stop it from getting worse, the kidneys may stop working permanently (end stage kidney disease). Most of the time, CKD does not go away, but it can often be controlled. People who have CKD can usually live normal lives. What are the causes? The most common causes of this  condition are diabetes and high blood pressure (hypertension). Other causes include:  Heart and blood vessel (cardiovascular) disease.  Kidney diseases: ? Glomerulonephritis. ? Interstitial nephritis. ? Polycystic kidney disease. ? Renal vascular disease.  Diseases that affect the immune system.  Genetic diseases.  Medicines that damage the kidneys, such as anti-inflammatory medicines.  Poisoning.  Being around or in contact with poisonous (toxic) substances.  A kidney or urinary infection that occurs again (recurs).  Vasculitis.  Repeat kidney infections.  A problem with urine flow that may be caused by: ? Cancer. ? Having kidney stones more than one time. ? An enlarged prostate in males.  What increases the risk? This condition is more likely to develop in people who are:  Older than age 27.  Male.  Of African-American descent.  Current smokers or former smokers.  Obese.  You may also have an increased risk for CKD if you have a family history of CKDor youfrequently take medicines that are damaging to the kidneys. What are the signs or symptoms? Symptoms develop slowly and may not be obvious until the kidney damage becomes severe. It is possible to have a kidney disease for years without showing any symptoms. Symptoms of this condition can include:  Swelling (edema) of the face, legs, ankles, or feet.  Numbness, tingling, or loss of feeling (sensation) in the hands or feet.  Tiredness (lethargy).  Nausea or vomiting.  Confusion or trouble concentrating.  Problems with urination, such as: ? Painful or burning feeling during  urination. ? Decreased urine production. ? Frequent urination, especially at night. ? Bloody urine.  Muscle twitches and cramps, especially in the legs.  Shortness of breath.  Weakness.  Constant itchiness.  Loss of appetite.  Metallic taste in the mouth.  Trouble sleeping.  Pale lining of the eyelids and surface  of the eye (conjunctiva).  How is this diagnosed? This condition may be diagnosed with various tests. Tests may include:  Blood tests.  Urine tests.  Imaging tests.  A test in which a sample of tissue is removed from the kidneys to be looked at under a microscope (kidney biopsy).  These test results will help your health care provider determine what class of CKD you have. How is this treated? Most cases of CKD cannot be cured. Treatment usually involves relieving symptoms and preventing or slowing the progression of the disease. Treatment may include:  A special diet, which may require you to avoid alcohol, salty foods (sodium), and foods that are high in potassium, calcium, and protein.  Medicines: ? To lower blood pressure. ? To relieve low blood count (anemia). ? To relieve swelling. ? To protect your bones. ? To improve the balance of electrolytes in your blood.  Removing toxic waste from the body by using hemodialysis or peritoneal dialysis if the kidneys can no longer do their job (kidney failure).  Management of other conditions that are causing your CKD or making it worse.  Follow these instructions at home:  Follow your prescribed diet.  Take over-the-counter and prescription medicines only as told by your health care provider. ? Do not take any new medicines unless approved by your health care provider. Many medicines can worsen your kidney damage. ? Do not take any vitamin and mineral supplements unless approved by your health care provider. Many nutritional supplements can worsen your kidney damage. ? The dose of some medicines that you take may need to be adjusted.  Do not use any tobacco products, such as cigarettes, chewing tobacco, and e-cigarettes. If you need help quitting, ask your health care provider.  Keep all follow-up visits as told by your health care provider. This is important.  Keep track of your blood pressure. Report changes in your blood  pressure as told by your health care provider.  Achieve and maintain a healthy weight. If you need help with this, ask your health care provider.  Start or continue an exercise plan. Try to exercise at least 30 minutes a day, 5 days a week.  Stay current with immunizations as told by your health care provider. Where to find more information:  American Association of Kidney Patients: BombTimer.gl  National Kidney Foundation: www.kidney.[place]: https://mathis.com/  Life Options [facility]: www.lifeoptions.org and www.kidneyschool.org Contact a health care provider if:  Your symptoms get worse.  You develop new symptoms. Get help right away if:  You develop symptoms of end-stage kidney disease, which include: ? Headaches. ? Abnormally dark or light skin. ? Numbness in the hands or feet. ? Easy bruising. ? Frequent hiccups. ? Chest pain. ? Shortness of breath. ? End of menstruation in women.  You have a fever.  You have decreased urine production.  You have pain or bleeding when you urinate. This information is not intended to replace advice given to you by your health care provider. Make sure you discuss any questions you have with your health care provider. Document Released: 05/26/2008 Document Revised: 01/23/2016 Document Reviewed: 04/15/2012 Elsevier Interactive Patient Education  2017 Elsevier  Inc.  

## 2017-06-22 DIAGNOSIS — E113531 Type 2 diabetes mellitus with proliferative diabetic retinopathy with traction retinal detachment not involving the macula, right eye: Secondary | ICD-10-CM | POA: Diagnosis not present

## 2017-06-22 DIAGNOSIS — H4311 Vitreous hemorrhage, right eye: Secondary | ICD-10-CM | POA: Diagnosis not present

## 2017-06-25 ENCOUNTER — Ambulatory Visit (INDEPENDENT_AMBULATORY_CARE_PROVIDER_SITE_OTHER): Payer: Medicare Other

## 2017-06-25 ENCOUNTER — Ambulatory Visit (INDEPENDENT_AMBULATORY_CARE_PROVIDER_SITE_OTHER): Payer: Medicare Other | Admitting: Podiatry

## 2017-06-25 DIAGNOSIS — N183 Chronic kidney disease, stage 3 unspecified: Secondary | ICD-10-CM

## 2017-06-25 DIAGNOSIS — E0822 Diabetes mellitus due to underlying condition with diabetic chronic kidney disease: Secondary | ICD-10-CM | POA: Diagnosis not present

## 2017-06-25 DIAGNOSIS — M79676 Pain in unspecified toe(s): Secondary | ICD-10-CM | POA: Diagnosis not present

## 2017-06-25 DIAGNOSIS — M84375A Stress fracture, left foot, initial encounter for fracture: Secondary | ICD-10-CM

## 2017-06-25 DIAGNOSIS — B351 Tinea unguium: Secondary | ICD-10-CM | POA: Diagnosis not present

## 2017-06-25 DIAGNOSIS — R269 Unspecified abnormalities of gait and mobility: Secondary | ICD-10-CM

## 2017-06-25 DIAGNOSIS — Z794 Long term (current) use of insulin: Secondary | ICD-10-CM | POA: Diagnosis not present

## 2017-06-25 DIAGNOSIS — M79672 Pain in left foot: Secondary | ICD-10-CM | POA: Diagnosis not present

## 2017-06-30 NOTE — Progress Notes (Signed)
  Subjective:  Patient ID: Jerry Cantrell, male    DOB: 19-Jul-1966,  MRN: [id number]  Chief Complaint  Patient presents with  . Nail Problem    bilateral nail care/ 3 month f/u  . Foot Pain    Left foot, lateral edge pain occassional/ Right foot, top of foot pain occassional   51 y.o. male returns for at risk foot care. Patient has hx of DM insulin controlled, with CKD stage 4. Complaining of pain in the left foot along the lateral foot, states it comes and goes, when it is present it is sharp in nature. Patient is legally blind with diabetic retinopathy.  Objective:  There were no vitals filed for this visit. General AA&O x3. Normal mood and affect.  Vascular Dorsalis pedis and posterior tibial pulses  present 2+ and absent bilaterally  Capillary refill normal to all digits. Pedal hair growth diminished.  Neurologic Epicritic sensation present bilaterally. Protective sensation with 5.07 monofilament  present bilat.  Dermatologic No open lesions. Interspaces clear of maceration.  Normal skin temperature and turgor. Hyperkeratotic lesions: none bilaterally. Nails: brittle, discoloration yellow, onychomycosis, thickening  Orthopedic: No history of amputation. MMT 5/5 in dorsiflexion, plantarflexion, inversion, and eversion. Normal lower extremity joint ROM. Pain to palpation R 5th metatarsal. Pain with tuning fork R 5th metatarsal.   Radiographs: Taken and reviewed. No evidence of acute fracture or dislocation. Degenerative changes present. Assessment & Plan:  Patient was evaluated and treated and all questions answered.  DM with CKD -Nails debrided as below.  Procedure: Nail Debridement Rationale: Patient meets criteria for routine foot care due to DM with concomitant CDK Type of Debridement: manual, sharp debridement. Instrumentation: Nail nipper, rotary burr. Number of Nails: 10  ?5th Metatarsal stress fx -XR negative for fx, however positive pain with tuning fork. -CAM boot  dispensed for protection and gait assistance as the fracture heals. -F/u in 1 month for new XRs.  No Follow-up on file.

## 2017-07-07 ENCOUNTER — Telehealth: Payer: Self-pay | Admitting: Family Medicine

## 2017-07-07 NOTE — Telephone Encounter (Signed)
Copied from [place] #[id number]. Topic: Inquiry >> Jul 07, 2017 11:11 AM Jerry Cantrell wrote: Reason for CRM: [place] called for the Pt, PT stated that he will be out of the country until FEB. And is wanting a 3 month supply of his [place], please contact pt or pharmacy to advise

## 2017-07-08 NOTE — Telephone Encounter (Signed)
Spoke with pharmacist at [facility] He reviewed prescription and ageed there were enough refills (x11) on rx.

## 2017-07-13 ENCOUNTER — Ambulatory Visit: Payer: Medicare HMO | Admitting: Sports Medicine

## 2017-07-29 DIAGNOSIS — Z01818 Encounter for other preprocedural examination: Secondary | ICD-10-CM | POA: Diagnosis not present

## 2017-08-05 ENCOUNTER — Encounter: Payer: Medicare Other | Admitting: Podiatry

## 2017-09-02 ENCOUNTER — Telehealth: Payer: Self-pay | Admitting: Family Medicine

## 2017-09-02 ENCOUNTER — Other Ambulatory Visit: Payer: Self-pay

## 2017-09-02 MED ORDER — GABAPENTIN 300 MG PO CAPS: ORAL_CAPSULE | ORAL | 1 refills | Status: DC

## 2017-09-02 MED ORDER — GLUCOSE BLOOD VI STRP: ORAL_STRIP | 2 refills | Status: DC

## 2017-09-02 MED ORDER — HYDRALAZINE HCL 100 MG PO TABS: 100.0000 mg | ORAL_TABLET | Freq: Three times a day (TID) | ORAL | 1 refills | Status: DC

## 2017-09-02 NOTE — Telephone Encounter (Signed)
Copied from [place] [phone]. Topic: Quick Communication - Rx Refill/Question >> Sep 02, 2017  9:33 AM Yvette Rack wrote: Has the patient contacted their pharmacy? No.   (Agent: If no, request that the patient contact the pharmacy for the refill.) glucose blood (ACCU-CHEK AVIVA) test strip gabapentin (NEURONTIN) 300 MG capsule hydrALAZINE (APRESOLINE) 100 MG tablet  Patient states that he'll be moving back to [place] next month so he will make an appointment then   Preferred Pharmacy (with phone number or street name): [facility] [phone]   Agent: Please be advised that RX refills may take up to 3 business days. We ask that you follow-up with your pharmacy.

## 2017-09-03 DIAGNOSIS — H6011 Cellulitis of right external ear: Secondary | ICD-10-CM | POA: Diagnosis not present

## 2017-09-03 DIAGNOSIS — Z7982 Long term (current) use of aspirin: Secondary | ICD-10-CM | POA: Diagnosis not present

## 2017-09-03 DIAGNOSIS — Z794 Long term (current) use of insulin: Secondary | ICD-10-CM | POA: Diagnosis not present

## 2017-09-03 DIAGNOSIS — N189 Chronic kidney disease, unspecified: Secondary | ICD-10-CM | POA: Diagnosis not present

## 2017-09-03 DIAGNOSIS — Z79899 Other long term (current) drug therapy: Secondary | ICD-10-CM | POA: Diagnosis not present

## 2017-09-03 DIAGNOSIS — I129 Hypertensive chronic kidney disease with stage 1 through stage 4 chronic kidney disease, or unspecified chronic kidney disease: Secondary | ICD-10-CM | POA: Diagnosis not present

## 2017-09-03 DIAGNOSIS — E1122 Type 2 diabetes mellitus with diabetic chronic kidney disease: Secondary | ICD-10-CM | POA: Diagnosis not present

## 2017-09-03 DIAGNOSIS — R21 Rash and other nonspecific skin eruption: Secondary | ICD-10-CM | POA: Diagnosis not present

## 2017-09-14 DIAGNOSIS — N189 Chronic kidney disease, unspecified: Secondary | ICD-10-CM | POA: Diagnosis not present

## 2017-09-14 DIAGNOSIS — Z7982 Long term (current) use of aspirin: Secondary | ICD-10-CM | POA: Diagnosis not present

## 2017-09-14 DIAGNOSIS — Z79899 Other long term (current) drug therapy: Secondary | ICD-10-CM | POA: Diagnosis not present

## 2017-09-14 DIAGNOSIS — E1122 Type 2 diabetes mellitus with diabetic chronic kidney disease: Secondary | ICD-10-CM | POA: Diagnosis not present

## 2017-09-14 DIAGNOSIS — E119 Type 2 diabetes mellitus without complications: Secondary | ICD-10-CM | POA: Diagnosis not present

## 2017-09-14 DIAGNOSIS — I120 Hypertensive chronic kidney disease with stage 5 chronic kidney disease or end stage renal disease: Secondary | ICD-10-CM | POA: Diagnosis not present

## 2017-09-14 DIAGNOSIS — L309 Dermatitis, unspecified: Secondary | ICD-10-CM | POA: Diagnosis not present

## 2017-09-14 DIAGNOSIS — N186 End stage renal disease: Secondary | ICD-10-CM | POA: Diagnosis not present

## 2017-09-14 DIAGNOSIS — H60541 Acute eczematoid otitis externa, right ear: Secondary | ICD-10-CM | POA: Diagnosis not present

## 2017-09-14 DIAGNOSIS — I129 Hypertensive chronic kidney disease with stage 1 through stage 4 chronic kidney disease, or unspecified chronic kidney disease: Secondary | ICD-10-CM | POA: Diagnosis not present

## 2017-09-14 DIAGNOSIS — Z794 Long term (current) use of insulin: Secondary | ICD-10-CM | POA: Diagnosis not present

## 2017-09-20 NOTE — Progress Notes (Signed)
Encounter created in error

## 2017-09-23 ENCOUNTER — Telehealth: Payer: Self-pay | Admitting: Family Medicine

## 2017-09-23 DIAGNOSIS — IMO0002 Reserved for concepts with insufficient information to code with codable children: Secondary | ICD-10-CM

## 2017-09-23 DIAGNOSIS — E1165 Type 2 diabetes mellitus with hyperglycemia: Secondary | ICD-10-CM

## 2017-09-23 DIAGNOSIS — Z794 Long term (current) use of insulin: Principal | ICD-10-CM

## 2017-09-23 NOTE — Telephone Encounter (Signed)
Copied from [place] [id number]. Topic: Quick Communication - Rx Refill/Question >> Sep 23, 2017 11:49 AM Oliver Pila B wrote: Medication: Insulin Glargine (LANTUS SOLOSTAR) 100 UNIT/ML Solostar Pen [[id number]] , insulin aspart (NOVOLOG FLEXPEN) 100 UNIT/ML FlexPen [[id number]]    Has the patient contacted their pharmacy? Yes.     (Agent: If no, request that the patient contact the pharmacy for the refill.)   Preferred Pharmacy (with phone number or street name): [facility] in [place], [place]   Agent: Please be advised that RX refills may take up to 3 business days. We ask that you follow-up with your pharmacy.

## 2017-09-25 MED ORDER — INSULIN GLARGINE 100 UNIT/ML SOLOSTAR PEN: PEN_INJECTOR | SUBCUTANEOUS | 3 refills | Status: DC

## 2017-09-25 MED ORDER — INSULIN ASPART 100 UNIT/ML FLEXPEN: PEN_INJECTOR | SUBCUTANEOUS | 3 refills | Status: DC

## 2017-09-25 NOTE — Telephone Encounter (Signed)
Meds refilled per RX refill protocol. Phone call to patient, he is appreciative. States he is currently waiting on his house inspection. Advised to call if any questions or concerns, he is agreeable. Closing note.

## 2017-09-27 ENCOUNTER — Telehealth: Payer: Self-pay | Admitting: *Deleted

## 2017-09-27 NOTE — Telephone Encounter (Signed)
Novolog Flexpen is not coverd. PA started   [facility]  The following alternatives are the preferred alternatives: Humalog [facility] OR Humalog Junior [facility] OR Humalog [facility]

## 2017-09-29 MED ORDER — INSULIN LISPRO 100 UNIT/ML (KWIKPEN): PEN_INJECTOR | SUBCUTANEOUS | 11 refills | Status: DC

## 2017-09-29 NOTE — Telephone Encounter (Signed)
Medication adjusted. Please let patient know.

## 2017-09-29 NOTE — Addendum Note (Signed)
Addended by: Delia Chimes A on: 09/29/2017 06:06 PM   Modules accepted: Orders

## 2017-09-30 ENCOUNTER — Telehealth: Payer: Self-pay

## 2017-09-30 NOTE — Telephone Encounter (Signed)
Notified patient VIA VM that he had been prescribed a different medication by Dr. Nolon Rod and it is ready at the pharmacy.

## 2017-10-01 ENCOUNTER — Encounter: Payer: Self-pay | Admitting: Family Medicine

## 2017-10-01 NOTE — Telephone Encounter (Signed)
Copied from [place] [id number]. Topic: Quick Communication - Rx Refill/Question >> Oct 01, 2017  9:54 AM Percell Belt A wrote: Medication: Amlodepine, verapomil, Carvedilol, Metolazone, Hydroxyine    Has the patient contacted their pharmacy?    (Agent: If no, request that the patient contact the pharmacy for the refill.)   Preferred Pharmacy (with phone number or street name): [facility] in [place] on file /  pt got at house in [place] and is waiting for [place] to inspect it.  He has to stay there until next week.  He said that he can not come back here for an appt right because he has nowhere to stay.    Agent: Please be advised that RX refills may take up to 3 business days. We ask that you follow-up with your pharmacy.

## 2017-10-01 NOTE — Progress Notes (Signed)
Hydroxyzine 25mg , Metolazone 5mg , Carvedilol 25mg , Verapamil 120mg , Amlodipine 10mg  refills  Last OV: 06/19/17  Last Refill:Hydroxyzine 05/10/17 30 tab/3 refill; Metolazone 06/13/17 90 tab/1 refill; Carvedilol 06/19/17 180 tab/1 refill; Verapamil 06/19/17 90 tab/1 refill; Amlodipine 05/10/17 90 tab/3 refills  Pharmacy:[facility] in [place] (on file)

## 2017-10-01 NOTE — Telephone Encounter (Signed)
This encounter was created in error - please disregard.

## 2017-10-04 ENCOUNTER — Telehealth: Payer: Self-pay | Admitting: Family Medicine

## 2017-10-04 NOTE — Telephone Encounter (Signed)
Copied from [place] [phone]. Topic: Quick Communication - See Telephone Encounter >> Oct 04, 2017 12:00 PM [place], Laban Emperor wrote: CRM for notification. See Telephone encounter for: Pt med was called into the wrong phamracy, please correct.  insulin lispro (HUMALOG KWIKPEN) 100 UNIT/ML Mayer Masker [[id number]]   [place], [place] [phone] (Phone) [phone] (Fax)   10/04/17.

## 2017-10-04 NOTE — Telephone Encounter (Signed)
Incoming call from [place] at [employer] transferred from the call center. Advised Humalog Claiborne Rigg has been prescribed for patient. She states PA for [employer] is still pending, will notify patient Humalog Claiborne Rigg is ready for him at [facility].

## 2017-10-05 ENCOUNTER — Other Ambulatory Visit: Payer: Self-pay | Admitting: *Deleted

## 2017-10-05 MED ORDER — INSULIN LISPRO 100 UNIT/ML (KWIKPEN): PEN_INJECTOR | SUBCUTANEOUS | 11 refills | Status: DC

## 2017-10-05 NOTE — Telephone Encounter (Signed)
done

## 2017-10-08 ENCOUNTER — Other Ambulatory Visit: Payer: Self-pay | Admitting: *Deleted

## 2017-10-08 ENCOUNTER — Telehealth: Payer: Self-pay | Admitting: Family Medicine

## 2017-10-08 MED ORDER — ATORVASTATIN CALCIUM 80 MG PO TABS: ORAL_TABLET | ORAL | 2 refills | Status: DC

## 2017-10-08 MED ORDER — POTASSIUM CHLORIDE ER 20 MEQ PO TBCR: 20.0000 meq | EXTENDED_RELEASE_TABLET | Freq: Two times a day (BID) | ORAL | 2 refills | Status: DC

## 2017-10-08 NOTE — Telephone Encounter (Signed)
Remainder of Rx and refills forwarded to new pharmacy. LOV: 06/19/17

## 2017-10-08 NOTE — Telephone Encounter (Signed)
Copied from [place]. Topic: Inquiry >> Oct 08, 2017  8:54 AM Pricilla Handler wrote: Reason for CRM: Patient called requesting 90 Day Supply refills of two medications: Atorvastatin (LIPITOR) 80 MG tablet and Potassium Chloride ER 20 MEQ TBCR. Patient's preferred pharmacy is [facility] [address] [place] [phone] (Phone) [phone] (Fax)

## 2017-10-15 ENCOUNTER — Other Ambulatory Visit: Payer: Self-pay

## 2017-10-15 ENCOUNTER — Telehealth: Payer: Self-pay | Admitting: Family Medicine

## 2017-10-15 MED ORDER — HYDROXYZINE HCL 25 MG PO TABS: ORAL_TABLET | ORAL | 3 refills | Status: DC

## 2017-10-15 MED ORDER — VERAPAMIL HCL ER 120 MG PO TBCR: 120.0000 mg | EXTENDED_RELEASE_TABLET | Freq: Every day | ORAL | 1 refills | Status: DC

## 2017-10-15 MED ORDER — CETIRIZINE HCL 10 MG PO TABS: 10.0000 mg | ORAL_TABLET | Freq: Every day | ORAL | 3 refills | Status: DC

## 2017-10-15 MED ORDER — CLOTRIMAZOLE 1 % EX CREA: 1.0000 "application " | TOPICAL_CREAM | Freq: Two times a day (BID) | CUTANEOUS | 0 refills | Status: DC

## 2017-10-15 NOTE — Addendum Note (Signed)
Addended by: Matilde Sprang on: 10/15/2017 01:45 PM   Modules accepted: Orders

## 2017-10-15 NOTE — Telephone Encounter (Signed)
Copied from [place]. Topic: Quick Communication - Rx Refill/Question >> Oct 15, 2017  1:02 PM Wynetta Emery, [place] C wrote:    Medication: clotrimazole (LOTRIMIN AF) 1 % cream, hydrOXYzine (ATARAX/VISTARIL) 25 MG tablet and also verapamil (CALAN-SR) 120 MG CR tablet   Pt would like a 90 day supply on medications    Has the patient contacted their pharmacy? no  (Agent: If no, request that the patient contact the pharmacy for the refill.)   Preferred Pharmacy (with phone number or street name): [place], [place] [facility]            [phone] (Phone) [phone] (Fax)   Agent: Please be advised that RX refills may take up to 3 business days. We ask that you follow-up with your pharmacy.

## 2017-10-15 NOTE — Telephone Encounter (Signed)
Pt states he forgot to ask for a refill of cetirizine (ZYRTEC) Uses [facility] in [place]

## 2017-10-25 ENCOUNTER — Ambulatory Visit: Payer: Medicare Other | Admitting: Family Medicine

## 2017-11-10 ENCOUNTER — Telehealth: Payer: Self-pay

## 2017-11-10 NOTE — Telephone Encounter (Signed)
Phone call to patient. He states he has moved back to the area. Updated his preferred pharmacy.   He states he was seen at the emergency room in [place] for "rash and pustules" all over his body. He states that he was given something that starts with the letter "C". Call center documentation states this is clobetasol, asked patient to verify. He states he is not sure, medication starts with a C. He states that the medication calmed down the skin issue, but it is now coming back. Eczema is very bothersome to patient, states he is bleeding in some places (his ear) due to bleeding. Clotrimazole prescribed by Dr. Nolon Rod is not effective Hoping to ask for a pill at next visit with Dr. Nolon Rod on 11/24/17.   Patient advised to come in to clinic earlier than next scheduled appointment due to worsening of symptoms and new medication request. He is agreeable. Patient transferred to front desk to schedule.

## 2017-11-10 NOTE — Telephone Encounter (Signed)
Copied from [place] [phone]. Topic: Inquiry >> Nov 09, 2017  9:56 AM Pricilla Handler wrote: Reason for CRM: Patient called wanting to know if Dr. Nolon Rod could prescribe Clobetasol for his Eczema that he received while he was in [place]. Patient states that the  Clotrimazole (LOTRIMIN AF) 1 % cream is not working for his Eczema. Please call patient at [phone].       Thank You!!!

## 2017-11-11 ENCOUNTER — Encounter: Payer: Self-pay | Admitting: Family Medicine

## 2017-11-11 ENCOUNTER — Ambulatory Visit (INDEPENDENT_AMBULATORY_CARE_PROVIDER_SITE_OTHER): Payer: Medicare Other | Admitting: Family Medicine

## 2017-11-11 ENCOUNTER — Other Ambulatory Visit: Payer: Self-pay

## 2017-11-11 VITALS — BP 146/86 | HR 70 | Temp 97.6°F | Resp 17 | Ht 70.0 in | Wt 272.6 lb

## 2017-11-11 DIAGNOSIS — E1165 Type 2 diabetes mellitus with hyperglycemia: Secondary | ICD-10-CM | POA: Diagnosis not present

## 2017-11-11 DIAGNOSIS — N184 Chronic kidney disease, stage 4 (severe): Secondary | ICD-10-CM | POA: Diagnosis not present

## 2017-11-11 DIAGNOSIS — I10 Essential (primary) hypertension: Secondary | ICD-10-CM

## 2017-11-11 DIAGNOSIS — H60541 Acute eczematoid otitis externa, right ear: Secondary | ICD-10-CM | POA: Diagnosis not present

## 2017-11-11 DIAGNOSIS — Z794 Long term (current) use of insulin: Secondary | ICD-10-CM

## 2017-11-11 DIAGNOSIS — IMO0002 Reserved for concepts with insufficient information to code with codable children: Secondary | ICD-10-CM

## 2017-11-11 LAB — POCT GLYCOSYLATED HEMOGLOBIN (HGB A1C): Hemoglobin A1C: 7.7

## 2017-11-11 MED ORDER — CLOBETASOL PROPIONATE 0.05 % EX CREA: 1.0000 "application " | TOPICAL_CREAM | Freq: Two times a day (BID) | CUTANEOUS | 0 refills | Status: DC

## 2017-11-11 MED ORDER — INSULIN LISPRO 100 UNIT/ML (KWIKPEN): PEN_INJECTOR | SUBCUTANEOUS | 11 refills | Status: DC

## 2017-11-11 MED ORDER — INSULIN GLARGINE 100 UNIT/ML SOLOSTAR PEN: PEN_INJECTOR | SUBCUTANEOUS | 3 refills | Status: DC

## 2017-11-11 NOTE — Progress Notes (Signed)
Chief Complaint  Patient presents with  . recheck rash on legs, thighs,hands, elbows and right earlobe  . Medication Refill    needs all meds refill, needs new monitor and test strips, would like 90 day supply on all if possible    HPI   Patient has relocated back from [place] that he has appointment coming up  Diabetes Mellitus: Patient presents for follow up of diabetes. Symptoms: paresthesia of the feet. Symptoms have stabilized. Patient denies foot ulcerations, hyperglycemia, hypoglycemia , increase appetite, nausea, polydipsia, polyuria, visual disturbances, vomitting and weight loss.  Evaluation to date has been included: hemoglobin A1C.  Home sugars: BGs consistently in an acceptable range. Treatment to date: insulin  Lab Results  Component Value Date   HGBA1C 7.7 11/11/2017    CKD Stage IV Patient will be following up with Dr. Justin Mend at [facility] He is on his usual bp meds His last creatinine was 3.30 He is not using nsaids or anything to worsening his renal function He is avoiding illicit drugs and alcohol  Component     Latest Ref Rng & Units 05/10/2017  Glucose     65 - 99 mg/dL 124 (H)  BUN     6 - 24 mg/dL 59 (H)  Creatinine     0.76 - 1.27 mg/dL 3.30 (>)  GFR, Est Non African American     >59 mL/min/1.73 20 (L)  GFR, Est African American     >59 mL/min/1.73 24 (L)  BUN/Creatinine Ratio     9 - 20 18  Sodium     134 - 144 mmol/L 145 (H)  Potassium     3.5 - 5.2 mmol/L 3.8  Chloride     96 - 106 mmol/L 97  CO2     20 - 29 mmol/L 27  Calcium     8.7 - 10.2 mg/dL 9.8  Total Protein     6.0 - 8.5 g/dL 6.8  Albumin     3.5 - 5.5 g/dL 4.1  Globulin, Total     1.5 - 4.5 g/dL 2.7  Albumin/Globulin Ratio     1.2 - 2.2 1.5  Total Bilirubin     0.0 - 1.2 mg/dL 0.2  Alkaline Phosphatase     39 - 117 IU/L 125 (H)  AST     0 - 40 IU/L 15  ALT     0 - 44 IU/L 17  Hemoglobin A1C         Eczema Pt reports that he has eczema of the ear    It is very itchy He would like a refill of his topical steroid  Hypertension: Patient here for follow-up of elevated blood pressure. He is not exercising and is adherent to low salt diet.  Blood pressure is well controlled at home. Cardiac symptoms none. Patient denies chest pain, chest pressure/discomfort, claudication, dyspnea, exertional chest pressure/discomfort, fatigue, irregular heart beat and lower extremity edema.  Cardiovascular risk factors: diabetes mellitus, dyslipidemia, hypertension, male gender, microalbuminuria, obesity (BMI >= 30 kg/m2) and smoking/ tobacco exposure. Use of agents associated with hypertension: none. History of target organ damage: chronic kidney disease. BP Readings from Last 3 Encounters:  11/11/17 (!) 146/86  06/19/17 130/78  05/27/17 132/84     Past Medical History:  Diagnosis Date  . Acute diastolic heart failure ([place])   . CHF (congestive heart failure) ([place])   . Diabetes mellitus with nephropathy ([place]) 05/12/2012   Dr. Justin Mend follows  . Hemorrhoids 05/12/2012  . Hyperlipidemia   .  Hypertension   . Lumbar spinal stenosis 05/03/2012   Mild with only right L4 nerve root encroachment, no neurogenic claudication   . Lumbar spondylosis 05/03/2012  . Meralgia paraesthetica 05/03/2012  . Meralgia paraesthetica 05/03/2012   On Lyrica which does improve pain.   Marland Kitchen Neuropathy in diabetes ([place]) 05/12/2012  . Obesity   . Pneumonia   . Retinopathy   . Sleep apnea    uses cpap  . Substance abuse ([place])   . Urinary incontinence 05/12/2012   Since the start of Sept. 2013     Current Outpatient Medications  Medication Sig Dispense Refill  . amitriptyline (ELAVIL) 50 MG tablet Take 1 tablet (50 mg total) by mouth at bedtime. 90 tablet 1  . amLODipine (NORVASC) 10 MG tablet Take 1 tablet (10 mg total) by mouth daily. 90 tablet 3  . aspirin EC 81 MG tablet Take 1 tablet (81 mg total) by mouth daily. 90 tablet 3  . atorvastatin (LIPITOR) 80 MG tablet TAKE ONE (1) TABLET  BY MOUTH EVERY DAY 90 tablet 2  . carvedilol (COREG) 25 MG tablet TAKE ONE (1) TABLET BY MOUTH TWO (2) TIMES DAILY WITH FOOD 180 tablet 1  . cetirizine (ZYRTEC) 10 MG tablet Take 1 tablet (10 mg total) by mouth daily. 90 tablet 3  . clotrimazole (LOTRIMIN AF) 1 % cream Apply 1 application topically 2 (two) times daily. 30 g 0  . fluticasone (FLONASE) 50 MCG/ACT nasal spray Place 2 sprays into both nostrils daily. 48 g 3  . furosemide (LASIX) 80 MG tablet Take 0.5 tablets (40 mg total) by mouth 2 (two) times daily. OFFICE VISIT NEEDED FOR REFILLS 60 tablet 0  . gabapentin (NEURONTIN) 300 MG capsule TAKE 1 CAPSULE BY MOUTH THREE TIMES A DAY 90 capsule 1  . hydrOXYzine (ATARAX/VISTARIL) 25 MG tablet TAKE ONE (1) TABLET BY MOUTH 3 TIMES DAILY AS NEEDED FOR ITCHING 30 tablet 3  . Insulin Glargine (LANTUS SOLOSTAR) 100 UNIT/ML Solostar Pen INJECT 45 UNITS SUBCUTANEOUSLY EVERY MORNING AND INJECT 35 UNITS EVERY NIGHT 60 mL 3  . insulin lispro (HUMALOG KWIKPEN) 100 UNIT/ML KiwkPen INJECT 0-60 UNITS INTO THE SKIN 3 TIMES DAILY WITH MEALS. INJECT INTO THE SKIN AS DIRECTED BASED ON SLIDING SCALE 15 mL 11  . metolazone (ZAROXOLYN) 5 MG tablet Take 1 tablet (5 mg total) by mouth daily. 90 tablet 1  . nicotine (NICODERM CQ) 21 mg/24hr patch Place 1 patch (21 mg total) onto the skin daily. 28 patch 4  . nitroGLYCERIN (NITROSTAT) 0.4 MG SL tablet Place 1 tablet (0.4 mg total) under the tongue every 5 (five) minutes as needed for chest pain. 25 tablet prn  . polyethylene glycol powder (GLYCOLAX/MIRALAX) powder Take 255 g by mouth daily. 255 g 3  . Polyvinyl Alcohol-Povidone PF (REFRESH) 1.4-0.6 % SOLN Apply 2 drops to eye 3 (three) times daily as needed. 90 each 6  . Potassium Chloride ER 20 MEQ TBCR Take 20 mEq by mouth 2 (two) times daily. 180 tablet 2  . tetrahydrozoline-zinc (EYE DROPS ALLERGY RELIEF) 0.05-0.25 % ophthalmic solution Place 2 drops into both eyes 3 (three) times daily as needed. 30 mL 3  . urea  (GORDONS UREA) 40 % ointment Apply topically as needed. 30 g 2  . verapamil (CALAN-SR) 120 MG CR tablet Take 1 tablet (120 mg total) by mouth at bedtime. 90 tablet 1  . ACCU-CHEK SOFTCLIX LANCETS lancets Use as instructed 400 each 2  . Blood Glucose Monitoring Suppl (ACCU-CHEK AVIVA) device Use  as instructed 1 each 0  . clobetasol cream (TEMOVATE) 2.42 % Apply 1 application topically 2 (two) times daily. 30 g 0  . glucose blood (ACCU-CHEK AVIVA) test strip Use as directed to check blood sugar 5 times a day before meals and as needed. 400 each 2  . hydrALAZINE (APRESOLINE) 100 MG tablet TAKE ONE TABLET BY MOUTH THREE TIMES A DAY. 270 tablet 0  . Olopatadine HCl 0.2 % SOLN Apply 1 drop to eye daily. 2.5 mL 0   Current Facility-Administered Medications  Medication Dose Route Frequency Provider Last Rate Last Dose  . triamcinolone acetonide (KENALOG) 10 MG/ML injection 10 mg  10 mg Other Once Landis Martins, DPM        Allergies: No Known Allergies  Past Surgical History:  Procedure Laterality Date  . ABDOMINAL SURGERY     Abscess I&D 2/2 infected hair  . AV FISTULA PLACEMENT Left 02/06/2016   Procedure: LEFT ARM RADIOCEPHALIC ARTERIOVENOUS (AV) FISTULA CREATION;  Surgeon: Rosetta Posner, MD;  Location: [facility];  Service: Vascular;  Laterality: Left;  . COLONOSCOPY W/ POLYPECTOMY     pt to bring records  . COLONOSCOPY WITH PROPOFOL N/A 06/05/2016   Procedure: COLONOSCOPY WITH PROPOFOL;  Surgeon: Doran Stabler, MD;  Location: [facility];  Service: Gastroenterology;  Laterality: N/A;  . TONSILLECTOMY      Social History   Socioeconomic History  . Marital status: Single    Spouse name: None  . Number of children: None  . Years of education: None  . Highest education level: None  Social Needs  . Financial resource strain: None  . Food insecurity - worry: None  . Food insecurity - inability: None  . Transportation needs - medical: None  . Transportation needs - non-medical: None    Occupational History  . Occupation: dietary services    Employer: [employer]  Tobacco Use  . Smoking status: Current Every Day Smoker    Packs/day: 0.50    Years: 30.00    Pack years: 15.00    Types: Cigarettes  . Smokeless tobacco: Never Used  . Tobacco comment: "Trying - hard to stop"  Substance and Sexual Activity  . Alcohol use: No    Comment: " about 40 ounce beer per month."  down to a beer every 3 weeks  . Drug use: No    Comment: marijuana "laced with something"; today, stated "no" to question of illegal drug use 06/16/2016  . Sexual activity: None  Other Topics Concern  . None  Social History Narrative  . None    Family History  Problem Relation Age of Onset  . Prostate cancer Father   . Alcohol abuse Father   . Emphysema Father        smoked  . Colon cancer Neg Hx      ROS Review of Systems See HPI Constitution: No fevers or chills No malaise No diaphoresis Skin: No rash or itching Eyes: no blurry vision, no double vision GU: no dysuria or hematuria Neuro: no dizziness or headaches all others reviewed and negative   Objective: Vitals:   11/11/17 1217  BP: (!) 146/86  Pulse: 70  Resp: 17  Temp: 97.6 F (36.4 C)  TempSrc: Oral  SpO2: 95%  Weight: 272 lb 9.6 oz (123.7 kg)  Height: 5\' 10"  (1.778 m)    Physical Exam Physical Exam  Constitutional: he is oriented to person, place, and time. he appears well-developed and well-nourished.  HENT:  Head: Normocephalic and  atraumatic.  Eyes: Conjunctivae and EOM are normal.  Cardiovascular: Normal rate, regular rhythm and normal heart sounds.   Pulmonary/Chest: Effort normal and breath sounds normal. No respiratory distress. he has no wheezes.  Abdominal: Normal appearance and bowel sounds are normal. There is no tenderness. There is no CVA tenderness.  Skin: dry flaky skin especially on right ear pinna Neurological: he is alert and oriented to person, place, and time.    Assessment and  Plan Jacarie was seen today for recheck rash on legs, thighs,hands, elbows and right earlobe and medication refill.  Diagnoses and all orders for this visit:  Insulin dependent type 2 diabetes mellitus, uncontrolled ([place])- pt medication changed due to cost He should follow up with Endocrinology -     POCT glycosylated hemoglobin (Hb A1C) -     Comprehensive metabolic panel -     Insulin Glargine (LANTUS SOLOSTAR) 100 UNIT/ML Solostar Pen; INJECT 45 UNITS SUBCUTANEOUSLY EVERY MORNING AND INJECT 35 UNITS EVERY NIGHT -     insulin lispro (HUMALOG KWIKPEN) 100 UNIT/ML KiwkPen; INJECT 0-60 UNITS INTO THE SKIN 3 TIMES DAILY WITH MEALS. INJECT INTO THE SKIN AS DIRECTED BASED ON SLIDING SCALE  Chronic kidney disease, stage IV (severe) ([place])- he has upcoming appt with Nephrology -     Comprehensive metabolic panel  Eczema of right external ear- refilled creams -     clobetasol cream (TEMOVATE) 0.05 %; Apply 1 application topically 2 (two) times daily.  Essential hypertension, malignant- follow up with Nephrology  Other orders -     Cancel: Microalbumin, urine   A total of 25 minutes were spent face-to-face with the patient during this encounter and over half of that time was spent on counseling and coordination of care.   [place]

## 2017-11-11 NOTE — Patient Instructions (Signed)
     IF you received an x-ray today, you will receive an invoice from [facility]. Please contact [facility] at [phone] with questions or concerns regarding your invoice.   IF you received labwork today, you will receive an invoice from [facility]. Please contact [facility] at [phone] with questions or concerns regarding your invoice.   Our billing staff will not be able to assist you with questions regarding bills from these companies.  You will be contacted with the lab results as soon as they are available. The fastest way to get your results is to activate your My Chart account. Instructions are located on the last page of this paperwork. If you have not heard from us regarding the results in 2 weeks, please contact this office.     

## 2017-11-12 ENCOUNTER — Telehealth: Payer: Self-pay | Admitting: Family Medicine

## 2017-11-12 ENCOUNTER — Other Ambulatory Visit: Payer: Self-pay | Admitting: Family Medicine

## 2017-11-12 ENCOUNTER — Telehealth: Payer: Self-pay

## 2017-11-12 DIAGNOSIS — E083499 Diabetes mellitus due to underlying condition with severe nonproliferative diabetic retinopathy without macular edema, unspecified eye: Secondary | ICD-10-CM

## 2017-11-12 DIAGNOSIS — D638 Anemia in other chronic diseases classified elsewhere: Secondary | ICD-10-CM | POA: Diagnosis not present

## 2017-11-12 DIAGNOSIS — I129 Hypertensive chronic kidney disease with stage 1 through stage 4 chronic kidney disease, or unspecified chronic kidney disease: Secondary | ICD-10-CM | POA: Diagnosis not present

## 2017-11-12 DIAGNOSIS — N184 Chronic kidney disease, stage 4 (severe): Secondary | ICD-10-CM

## 2017-11-12 DIAGNOSIS — E1122 Type 2 diabetes mellitus with diabetic chronic kidney disease: Secondary | ICD-10-CM | POA: Diagnosis not present

## 2017-11-12 DIAGNOSIS — E0865 Diabetes mellitus due to underlying condition with hyperglycemia: Secondary | ICD-10-CM

## 2017-11-12 DIAGNOSIS — Z794 Long term (current) use of insulin: Secondary | ICD-10-CM

## 2017-11-12 DIAGNOSIS — N183 Chronic kidney disease, stage 3 (moderate): Secondary | ICD-10-CM | POA: Diagnosis not present

## 2017-11-12 DIAGNOSIS — N2581 Secondary hyperparathyroidism of renal origin: Secondary | ICD-10-CM | POA: Diagnosis not present

## 2017-11-12 DIAGNOSIS — IMO0002 Reserved for concepts with insufficient information to code with codable children: Secondary | ICD-10-CM

## 2017-11-12 LAB — COMPREHENSIVE METABOLIC PANEL
ALBUMIN: 4.1 g/dL (ref 3.5–5.5)
ALT: 19 IU/L (ref 0–44)
AST: 15 IU/L (ref 0–40)
Albumin/Globulin Ratio: 1.4 (ref 1.2–2.2)
Alkaline Phosphatase: 95 IU/L (ref 39–117)
BUN / CREAT RATIO: 18 (ref 9–20)
BUN: 78 mg/dL — AB (ref 6–24)
Bilirubin Total: 0.3 mg/dL (ref 0.0–1.2)
CALCIUM: 10.1 mg/dL (ref 8.7–10.2)
CO2: 28 mmol/L (ref 20–29)
CREATININE: 4.26 mg/dL — AB (ref 0.76–1.27)
Chloride: 93 mmol/L — ABNORMAL LOW (ref 96–106)
GFR calc non Af Amer: 15 mL/min/{1.73_m2} — ABNORMAL LOW (ref >59)
GFR, EST AFRICAN AMERICAN: 17 mL/min/{1.73_m2} — AB (ref >59)
GLUCOSE: 250 mg/dL — AB (ref 65–99)
Globulin, Total: 2.9 g/dL (ref 1.5–4.5)
Potassium: 3.5 mmol/L (ref 3.5–5.2)
Sodium: 140 mmol/L (ref 134–144)
TOTAL PROTEIN: 7 g/dL (ref 6.0–8.5)

## 2017-11-12 NOTE — Telephone Encounter (Signed)
Faxed CRITICAL RESULTS- CMP results to Dr Jason Nest office  [phone] Called Dr. Jason Nest office - LMOVM for his assistant - fax sent and results also routed through Epic to Dr. Justin Mend.

## 2017-11-12 NOTE — Telephone Encounter (Signed)
Copied from [place] [phone]. Topic: Quick Communication - See Telephone Encounter >> Nov 12, 2017 11:05 AM Ahmed Prima L wrote: CRM for notification. See Telephone encounter for:   11/12/17.  Pt said he was seen in the office yesterday and Dr Nolon Rod was suppose to send over Blood Glucose Monitoring Suppl (ACCU-CHEK AVIVA) device & glucose blood (ACCU-CHEK AVIVA) test strip. [place], [place] this high bc it was suppose to be sent from his OV

## 2017-11-12 NOTE — Telephone Encounter (Signed)
Copied from [place]. Topic: Quick Communication - See Telephone Encounter >> Nov 12, 2017 10:25 AM Aurelio Brash B wrote: CRM for notification. See Telephone encounter for:  Pt is requesting a rx  for eye drops for his allergies.  He is also requesting a rx for therapeutic dog as  his landlord is asking him to pay extra for having a dog.   He states the RX for the dog can be mailed to him and the eye drops be sent to :  [place], [place] [phone] (Phone) [phone] (Fax)   11/12/17.

## 2017-11-15 ENCOUNTER — Other Ambulatory Visit: Payer: Self-pay | Admitting: *Deleted

## 2017-11-15 MED ORDER — GLUCOSE BLOOD VI STRP: ORAL_STRIP | 2 refills | Status: DC

## 2017-11-15 NOTE — Telephone Encounter (Signed)
Called patient 2 different times throughout day been busy both times. Test trips were called in Does he need kit looks like one was called in 05/10/2017?

## 2017-11-16 ENCOUNTER — Encounter: Payer: Self-pay | Admitting: *Deleted

## 2017-11-16 ENCOUNTER — Other Ambulatory Visit: Payer: Self-pay | Admitting: Family Medicine

## 2017-11-16 MED ORDER — ACCU-CHEK SOFTCLIX LANCETS MISC: 2 refills | Status: DC

## 2017-11-16 MED ORDER — OLOPATADINE HCL 0.2 % OP SOLN: 1.0000 [drp] | Freq: Every day | OPHTHALMIC | 0 refills | Status: DC

## 2017-11-16 MED ORDER — ACCU-CHEK AVIVA DEVI: 0 refills | Status: DC

## 2017-11-16 NOTE — Telephone Encounter (Signed)
Phone call to patient, relayed message from Dr. Nolon Rod. He verbalizes understanding.   States in 2015 was given a written prescription for a dog by another provider in [place]. States that this helped him lose weight due to having to take dog out and play with dog. He declines to make another appointment at this time.

## 2017-11-16 NOTE — Telephone Encounter (Signed)
Copied from [place] [phone]. Topic: Quick Communication - See Telephone Encounter >> Nov 16, 2017  9:41 AM Lolita Rieger, RMA wrote: CRM for notification. See Telephone encounter for:   11/16/17.pt called back and stated that he needs all of his medications sent to [facility] and that he is out of hydralazine 100 mg and his nasal spray  flonase needs that sent today if possible Please contact pt once this has been done [phone]

## 2017-11-16 NOTE — Telephone Encounter (Addendum)
During phone call in separate encounter, patient states [facility] cannot fill his strips and meter prescriptions until the end of march.   Pharmacy states he needs new prescriptions for lancets, meter, and test strips sent over. Test strip prescription sent over 11/15/17 to [facility], confirmation in epic.   Provider, please sign order for test supplies.

## 2017-11-16 NOTE — Addendum Note (Signed)
Addended by: Delle Reining on: 11/16/2017 12:20 PM   Modules accepted: Orders

## 2017-11-16 NOTE — Telephone Encounter (Signed)
[facility] eye drops called in   Please let the patient know that since I did not prescribe the dog as a part of a treatment plan or have it documented in my note I cannot write this letter.   If he would want to pursue this more then he should make an appointment to discuss how this dog is a part of a therapeutic plan and I can have documentation to support this.

## 2017-11-16 NOTE — Addendum Note (Signed)
Addended by: Delle Reining on: 11/16/2017 12:22 PM   Modules accepted: Orders

## 2017-11-16 NOTE — Telephone Encounter (Signed)
Spoke with patient in separate phone encounter. He states he is getting his medications on Friday, will have pharmacy contact provider with anything else he needs.   Hydralazine refilled by Dr. Nolon Rod. Should have a year's worth of Flonase. Phone call to [facility], spoke with Christy Sartorius. Requested he transfer prescription from [facility] pharmacy. He is agreeable. Closing note.

## 2017-11-17 ENCOUNTER — Telehealth: Payer: Self-pay | Admitting: Family Medicine

## 2017-11-17 DIAGNOSIS — N184 Chronic kidney disease, stage 4 (severe): Secondary | ICD-10-CM

## 2017-11-17 DIAGNOSIS — Z794 Long term (current) use of insulin: Secondary | ICD-10-CM

## 2017-11-17 DIAGNOSIS — IMO0002 Reserved for concepts with insufficient information to code with codable children: Secondary | ICD-10-CM

## 2017-11-17 DIAGNOSIS — E083499 Diabetes mellitus due to underlying condition with severe nonproliferative diabetic retinopathy without macular edema, unspecified eye: Secondary | ICD-10-CM

## 2017-11-17 DIAGNOSIS — E0865 Diabetes mellitus due to underlying condition with hyperglycemia: Secondary | ICD-10-CM

## 2017-11-17 NOTE — Telephone Encounter (Signed)
Copied from [employer]. Topic: Quick Communication - See Telephone Encounter >> Nov 17, 2017  9:18 AM Arletha Grippe wrote: CRM for notification. See Telephone encounter for:   11/17/17. [facility] about glucose blood (ACCU-CHEK AVIVA) test strip insurance is denying the rx because they need to have specific instructions Cb is [phone]

## 2017-11-17 NOTE — Telephone Encounter (Signed)
Pharmacy needs specific instructions for testing so insurance will cover.

## 2017-11-22 MED ORDER — ACCU-CHEK SOFTCLIX LANCETS MISC: 2 refills | Status: DC

## 2017-11-22 MED ORDER — ACCU-CHEK AVIVA DEVI: 0 refills | Status: DC

## 2017-11-22 MED ORDER — GLUCOSE BLOOD VI STRP: ORAL_STRIP | 2 refills | Status: DC

## 2017-11-22 NOTE — Telephone Encounter (Signed)
Per previous refill encounters, patient needs all medications sent to [facility] pharmacy.   Phone call to [facility]. Spoke with Christy Sartorius, pharmacist. New prescriptions with clarification on when to test for Meter, Lancets, and test strips sent to [facility].

## 2017-11-24 ENCOUNTER — Ambulatory Visit: Payer: Medicare Other | Admitting: Family Medicine

## 2017-12-01 DIAGNOSIS — H35372 Puckering of macula, left eye: Secondary | ICD-10-CM | POA: Diagnosis not present

## 2017-12-01 DIAGNOSIS — Z961 Presence of intraocular lens: Secondary | ICD-10-CM | POA: Diagnosis not present

## 2017-12-01 DIAGNOSIS — H25811 Combined forms of age-related cataract, right eye: Secondary | ICD-10-CM | POA: Diagnosis not present

## 2017-12-01 DIAGNOSIS — H47011 Ischemic optic neuropathy, right eye: Secondary | ICD-10-CM | POA: Diagnosis not present

## 2017-12-01 DIAGNOSIS — E113511 Type 2 diabetes mellitus with proliferative diabetic retinopathy with macular edema, right eye: Secondary | ICD-10-CM | POA: Diagnosis not present

## 2017-12-02 DIAGNOSIS — I129 Hypertensive chronic kidney disease with stage 1 through stage 4 chronic kidney disease, or unspecified chronic kidney disease: Secondary | ICD-10-CM | POA: Diagnosis not present

## 2017-12-02 DIAGNOSIS — N184 Chronic kidney disease, stage 4 (severe): Secondary | ICD-10-CM | POA: Diagnosis not present

## 2017-12-07 DIAGNOSIS — Z01812 Encounter for preprocedural laboratory examination: Secondary | ICD-10-CM | POA: Diagnosis not present

## 2017-12-07 DIAGNOSIS — L720 Epidermal cyst: Secondary | ICD-10-CM | POA: Diagnosis not present

## 2017-12-07 DIAGNOSIS — Z794 Long term (current) use of insulin: Secondary | ICD-10-CM | POA: Diagnosis not present

## 2017-12-07 DIAGNOSIS — E083213 Diabetes mellitus due to underlying condition with mild nonproliferative diabetic retinopathy with macular edema, bilateral: Secondary | ICD-10-CM | POA: Diagnosis not present

## 2017-12-07 DIAGNOSIS — Z01818 Encounter for other preprocedural examination: Secondary | ICD-10-CM | POA: Diagnosis not present

## 2017-12-09 ENCOUNTER — Other Ambulatory Visit: Payer: Self-pay | Admitting: Family Medicine

## 2017-12-09 DIAGNOSIS — E113512 Type 2 diabetes mellitus with proliferative diabetic retinopathy with macular edema, left eye: Secondary | ICD-10-CM | POA: Diagnosis not present

## 2017-12-09 DIAGNOSIS — E113511 Type 2 diabetes mellitus with proliferative diabetic retinopathy with macular edema, right eye: Secondary | ICD-10-CM | POA: Diagnosis not present

## 2017-12-09 DIAGNOSIS — H3582 Retinal ischemia: Secondary | ICD-10-CM | POA: Diagnosis not present

## 2017-12-09 DIAGNOSIS — H60541 Acute eczematoid otitis externa, right ear: Secondary | ICD-10-CM

## 2017-12-23 DIAGNOSIS — E113511 Type 2 diabetes mellitus with proliferative diabetic retinopathy with macular edema, right eye: Secondary | ICD-10-CM | POA: Diagnosis not present

## 2017-12-24 DIAGNOSIS — I129 Hypertensive chronic kidney disease with stage 1 through stage 4 chronic kidney disease, or unspecified chronic kidney disease: Secondary | ICD-10-CM | POA: Diagnosis not present

## 2018-01-04 ENCOUNTER — Ambulatory Visit (INDEPENDENT_AMBULATORY_CARE_PROVIDER_SITE_OTHER): Payer: Medicare Other | Admitting: Pulmonary Disease

## 2018-01-04 ENCOUNTER — Encounter: Payer: Self-pay | Admitting: Pulmonary Disease

## 2018-01-04 VITALS — BP 126/86 | HR 66 | Ht 70.0 in | Wt 272.6 lb

## 2018-01-04 DIAGNOSIS — R911 Solitary pulmonary nodule: Secondary | ICD-10-CM | POA: Diagnosis not present

## 2018-01-04 DIAGNOSIS — R918 Other nonspecific abnormal finding of lung field: Secondary | ICD-10-CM | POA: Diagnosis not present

## 2018-01-04 DIAGNOSIS — G4733 Obstructive sleep apnea (adult) (pediatric): Secondary | ICD-10-CM

## 2018-01-04 DIAGNOSIS — Z720 Tobacco use: Secondary | ICD-10-CM

## 2018-01-04 NOTE — Assessment & Plan Note (Signed)
He is tolerating the BiPAP well and compliance is improved and I think this is really helped.  His hospitalizations and improved his general health  Weight loss encouraged, compliance with goal of at least 4-6 hrs every night is the expectation. Advised against medications with sedative side effects Cautioned against driving when sleepy - understanding that sleepiness will vary on a day to day basis

## 2018-01-04 NOTE — Progress Notes (Signed)
   Subjective:    Patient ID: Jerry Cantrell, male    DOB: 06-02-1966, 52 y.o.   MRN: [id number]  HPI  52 yo smoker with polysubstance abuse (cocaine) and severe OSA on nocturnal BiPAP, chronic hypoxic respiratory failure on O2 He has chronic diastolic heart failure & CKD -4, has left arm AV fistula placed. He is undergoing transplant evaluation at [facility] and claims he was drug-free for the last 6 months.  He had to move to [place] because there was mold in his home here. He is growing his beard until his son comes back home from present in 2021  Claims compliance with his BiPAP. Download was reviewed which shows improved compliance with residual AHI of 10/hour, average bilevel settings of 17/12 with average usage about 6 hours  weight is stable at 270 pounds He is on metolazone and makes urine  He continues to smoke about a pack per day    Significant tests/ events  CT chest 05/2015 -evidence of old granulomatous disease with calcified right hilar lymph nodes, bilateral subcentimeter nodules , some new compared to 02/2010 CT chest 10/2016   stable nodules , some calcified and probably benign  PSG 01/2015- severe OSA, AHI 99/hour, not corrected by CPAP required BiPAP 25/21 with a full face mask  PFTs 10/2015 >> no obsn, mod restriction  10/2015 2-D echo nml LVEF, grade 1 diastolic dysfunction   Review of Systems neg for any significant sore throat, dysphagia, itching, sneezing, nasal congestion or excess/ purulent secretions, fever, chills, sweats, unintended wt loss, pleuritic or exertional cp, hempoptysis, orthopnea pnd or change in chronic leg swelling. Also denies presyncope, palpitations, heartburn, abdominal pain, nausea, vomiting, diarrhea or change in bowel or urinary habits, dysuria,hematuria, rash, arthralgias, visual complaints, headache, numbness weakness or ataxia.     Objective:   Physical Exam  Gen. Pleasant, obese, in no distress ENT - no lesions, no post  nasal drip Neck: No JVD, no thyromegaly, no carotid bruits Lungs: no use of accessory muscles, no dullness to percussion, decreased without rales or rhonchi  Cardiovascular: Rhythm regular, heart sounds  normal, no murmurs or gallops, no peripheral edema Musculoskeletal: No deformities, no cyanosis or clubbing , no tremors, Lt forearm thrill +        Assessment & Plan:

## 2018-01-04 NOTE — Patient Instructions (Signed)
Renew bipap supplies Ct chest without contrast

## 2018-01-04 NOTE — Assessment & Plan Note (Signed)
Smoking cessation again emphasized 

## 2018-01-04 NOTE — Assessment & Plan Note (Signed)
Schedule CT chest without contrast and if stable then presumed benign

## 2018-01-10 ENCOUNTER — Encounter: Payer: Self-pay | Admitting: Internal Medicine

## 2018-01-12 DIAGNOSIS — I129 Hypertensive chronic kidney disease with stage 1 through stage 4 chronic kidney disease, or unspecified chronic kidney disease: Secondary | ICD-10-CM | POA: Diagnosis not present

## 2018-01-12 DIAGNOSIS — D631 Anemia in chronic kidney disease: Secondary | ICD-10-CM | POA: Diagnosis not present

## 2018-01-12 DIAGNOSIS — N184 Chronic kidney disease, stage 4 (severe): Secondary | ICD-10-CM | POA: Diagnosis not present

## 2018-01-12 DIAGNOSIS — E785 Hyperlipidemia, unspecified: Secondary | ICD-10-CM | POA: Diagnosis not present

## 2018-01-12 DIAGNOSIS — E119 Type 2 diabetes mellitus without complications: Secondary | ICD-10-CM | POA: Diagnosis not present

## 2018-01-12 DIAGNOSIS — E1122 Type 2 diabetes mellitus with diabetic chronic kidney disease: Secondary | ICD-10-CM | POA: Diagnosis not present

## 2018-01-17 DIAGNOSIS — E113512 Type 2 diabetes mellitus with proliferative diabetic retinopathy with macular edema, left eye: Secondary | ICD-10-CM | POA: Diagnosis not present

## 2018-01-19 ENCOUNTER — Other Ambulatory Visit: Payer: Self-pay | Admitting: Family Medicine

## 2018-01-19 DIAGNOSIS — R05 Cough: Secondary | ICD-10-CM

## 2018-01-19 DIAGNOSIS — R059 Cough, unspecified: Secondary | ICD-10-CM

## 2018-01-26 ENCOUNTER — Other Ambulatory Visit: Payer: Self-pay | Admitting: Family Medicine

## 2018-01-26 DIAGNOSIS — H60541 Acute eczematoid otitis externa, right ear: Secondary | ICD-10-CM

## 2018-01-26 NOTE — Telephone Encounter (Signed)
Refill request for clobetasol cream0.05 %  30 g with no refills approved Dgaddy, cma

## 2018-01-29 ENCOUNTER — Other Ambulatory Visit: Payer: Self-pay | Admitting: Family Medicine

## 2018-01-31 ENCOUNTER — Encounter: Payer: Self-pay | Admitting: Internal Medicine

## 2018-01-31 ENCOUNTER — Ambulatory Visit (INDEPENDENT_AMBULATORY_CARE_PROVIDER_SITE_OTHER)
Admission: RE | Admit: 2018-01-31 | Discharge: 2018-01-31 | Disposition: A | Discharge status: Home / Self Care | Payer: Medicare Other | Source: Ambulatory Visit | Attending: Pulmonary Disease | Admitting: Pulmonary Disease

## 2018-01-31 ENCOUNTER — Ambulatory Visit (INDEPENDENT_AMBULATORY_CARE_PROVIDER_SITE_OTHER): Payer: Medicare Other | Admitting: Internal Medicine

## 2018-01-31 VITALS — BP 152/98 | HR 66 | Ht 70.0 in | Wt 275.4 lb

## 2018-01-31 DIAGNOSIS — I5032 Chronic diastolic (congestive) heart failure: Secondary | ICD-10-CM | POA: Diagnosis not present

## 2018-01-31 DIAGNOSIS — I10 Essential (primary) hypertension: Secondary | ICD-10-CM | POA: Diagnosis not present

## 2018-01-31 DIAGNOSIS — E782 Mixed hyperlipidemia: Secondary | ICD-10-CM

## 2018-01-31 DIAGNOSIS — Z720 Tobacco use: Secondary | ICD-10-CM

## 2018-01-31 DIAGNOSIS — R918 Other nonspecific abnormal finding of lung field: Secondary | ICD-10-CM | POA: Diagnosis not present

## 2018-01-31 NOTE — Patient Instructions (Addendum)
Your physician recommends that you continue on your current medications as directed. Please refer to the Current Medication list given to you today. Your physician wants you to follow-up in: March, 2020 with Dr. Harrington Challenger.  You will receive a reminder letter in the mail two months in advance. If you don't receive a letter, please call our office to schedule the follow-up appointment.  Addendum: called [facility] (Dr. Justin Mend) and requested last ov note and lab work to be faxed to [phone].

## 2018-01-31 NOTE — Progress Notes (Signed)
Cardiology Office Note   Date:  01/31/2018   ID:  Jerry Cantrell, DOB August 20, 1966, MRN [id number]  PCP:  [facility] Moron, MD  Cardiologist:   Dorris Carnes, MD   Pt presents for f/u of HTN   History of Present Illness: Jerry Cantrell is a 52 y.o. male with a history of CAP and resp failue , sepsis , ARF, HTN, OSA, DM  Diastolic CHF, HL.  I saw him in March 2017   Sine he was in clinic last he says his breathing is OK  No CP   He is also followed by [facility]  Just seen   CT of chest ordered which was just done.  Using biPAP   Still smoking    The pt denies CP    Breathing is stable    Pt was seen by Dr Justin Mend  When I saw him I started amlodipine   This was discontinued for concerns of renal blood flow the pt said  Now on verapamil BP at home is usually 140s/90s       Current Meds  Medication Sig  . ACCU-CHEK SOFTCLIX LANCETS lancets Use as directed to check blood sugar 5 times a day before meals and as needed.  Marland Kitchen amitriptyline (ELAVIL) 50 MG tablet Take 1 tablet (50 mg total) by mouth at bedtime.  Marland Kitchen aspirin EC 81 MG tablet Take 1 tablet (81 mg total) by mouth daily.  Marland Kitchen atorvastatin (LIPITOR) 80 MG tablet TAKE ONE (1) TABLET BY MOUTH EVERY DAY  . carvedilol (COREG) 25 MG tablet TAKE ONE (1) TABLET BY MOUTH TWO (2) TIMES DAILY WITH FOOD  . cetirizine (ZYRTEC) 10 MG tablet Take 1 tablet (10 mg total) by mouth daily.  . ferrous sulfate 325 (65 FE) MG tablet Take 325 mg by mouth daily with breakfast.  . fluticasone (FLONASE) 50 MCG/ACT nasal spray USE TWO SPRAYS INTO EACH NOSTRILS DAILY  . furosemide (LASIX) 80 MG tablet Take 80 mg by mouth 3 (three) times daily. 2 Tablets in the AM and 1 Tablet in the PM  . gabapentin (NEURONTIN) 300 MG capsule Take 300 mg by mouth at bedtime.  . hydrALAZINE (APRESOLINE) 100 MG tablet TAKE ONE TABLET BY MOUTH THREE TIMES A DAY.  . hydrOXYzine (ATARAX/VISTARIL) 25 MG tablet Take 25 mg by mouth 3 (three) times daily as needed.  . Insulin Glargine (LANTUS  SOLOSTAR) 100 UNIT/ML Solostar Pen INJECT 45 UNITS SUBCUTANEOUSLY EVERY MORNING AND INJECT 35 UNITS EVERY NIGHT  . insulin lispro (HUMALOG KWIKPEN) 100 UNIT/ML KiwkPen INJECT 0-60 UNITS INTO THE SKIN 3 TIMES DAILY WITH MEALS. INJECT INTO THE SKIN AS DIRECTED BASED ON SLIDING SCALE  . metolazone (ZAROXOLYN) 5 MG tablet Take 1 tablet (5 mg total) by mouth daily. (Patient taking differently: Take 5 mg by mouth daily. Monday, Wednesday and Friday 30 mins prior to all other medications.)  . nitroGLYCERIN (NITROSTAT) 0.4 MG SL tablet Place 1 tablet (0.4 mg total) under the tongue every 5 (five) minutes as needed for chest pain.  Marland Kitchen Potassium Chloride ER 20 MEQ TBCR Take 20 mEq by mouth 2 (two) times daily.  . verapamil (VERELAN PM) 180 MG 24 hr capsule Take 180 mg by mouth at bedtime.   Current Facility-Administered Medications for the 01/31/18 encounter (Office Visit) with Fay Records, MD  Medication  . triamcinolone acetonide (KENALOG) 10 MG/ML injection 10 mg     Allergies:   Patient has no known allergies.   Past Medical History:  Diagnosis Date  .  Acute diastolic heart failure ([place])   . CHF (congestive heart failure) ([place])   . Diabetes mellitus with nephropathy ([place]) 05/12/2012   Dr. Justin Mend follows  . Hemorrhoids 05/12/2012  . Hyperlipidemia   . Hypertension   . Lumbar spinal stenosis 05/03/2012   Mild with only right L4 nerve root encroachment, no neurogenic claudication   . Lumbar spondylosis 05/03/2012  . Meralgia paraesthetica 05/03/2012  . Meralgia paraesthetica 05/03/2012   On Lyrica which does improve pain.   Marland Kitchen Neuropathy in diabetes ([place]) 05/12/2012  . Obesity   . Pneumonia   . Retinopathy   . Sleep apnea    uses cpap  . Substance abuse ([place])   . Urinary incontinence 05/12/2012   Since the start of Sept. 2013     Past Surgical History:  Procedure Laterality Date  . ABDOMINAL SURGERY     Abscess I&D 2/2 infected hair  . AV FISTULA PLACEMENT Left 02/06/2016   Procedure: LEFT ARM  RADIOCEPHALIC ARTERIOVENOUS (AV) FISTULA CREATION;  Surgeon: Rosetta Posner, MD;  Location: [facility];  Service: Vascular;  Laterality: Left;  . COLONOSCOPY W/ POLYPECTOMY     pt to bring records  . COLONOSCOPY WITH PROPOFOL N/A 06/05/2016   Procedure: COLONOSCOPY WITH PROPOFOL;  Surgeon: Doran Stabler, MD;  Location: [facility];  Service: Gastroenterology;  Laterality: N/A;  . TONSILLECTOMY       Social History:  The patient  reports that he has been smoking cigarettes.  He has a 15.00 pack-year smoking history. He has never used smokeless tobacco. He reports that he does not drink alcohol or use drugs.   Family History:  The patient's family history includes Alcohol abuse in his father; Emphysema in his father; Prostate cancer in his father.    ROS:  Please see the history of present illness. All other systems are reviewed and  Negative to the above problem except as noted.    PHYSICAL EXAM: VS:  BP (!) 152/98   Pulse 66   Ht 5\' 10"  (1.778 m)   Wt 124.9 kg (275 lb 6.4 oz)   BMI 39.52 kg/m   GEN: Morbidly obese 52 yo in no acute distress  HEENT: normal  Neck: JVP is normal No, carotid bruits, or masses Cardiac: RRR; no murmurs, rubs, or gallops,no edema  Respiratory:  clear to auscultation bilaterally, normal work of breathing GI: soft, nontender, nondistended, + BS  No hepatomegaly  MS: no deformity Moving all extremities   Skin: warm and dry, no rash Neuro:  Strength and sensation are intact Psych: euthymic mood, full affect   EKG:  EKG is ordered today.  SR   First degree AV block   PR interval 230 msec   Septal infarct    Lipid Panel    Component Value Date/Time   CHOL 175 05/10/2017 1244   TRIG 353 (H) 05/10/2017 1244   HDL 35 (L) 05/10/2017 1244   CHOLHDL 5.0 05/10/2017 1244   CHOLHDL 6.3 (H) 06/16/2016 1147   VLDL NOT CALC 06/16/2016 1147   LDLCALC 69 05/10/2017 1244   LDLDIRECT 99 09/16/2010 2130      Wt Readings from Last 3 Encounters:  01/31/18 124.9 kg  (275 lb 6.4 oz)  01/04/18 123.7 kg (272 lb 9.6 oz)  11/11/17 123.7 kg (272 lb 9.6 oz)      ASSESSMENT AND PLAN:  1  HTN  BP is still not well controlled   Being seen in renal clnic   I will defer management  to Dr Justin Mend.  2  Chronic diastolic CHF  Volume status apppears OK    3  HL  Continue statin LDL in Sept 2018 was 69  4   Tob   Needs to stop smoking   Increaese activity Cut back on ASA to 81 mg perday    Current medicines are reviewed at length with the patient today.  The patient does not have concerns regarding medicines.  Signed, Dorris Carnes, MD  01/31/2018 2:50 PM    [address], [place], [place]  [address] Phone: [phone]; Fax: [phone]

## 2018-02-01 NOTE — Telephone Encounter (Signed)
LOV 11-11-17 with Dr. Nolon Rod. / Refill for Olopatadine / Medication is not listed on current medication list / Please review and refill if appropriate.

## 2018-02-01 NOTE — Telephone Encounter (Signed)
Please advise. Dgaddy, CMA 

## 2018-02-14 ENCOUNTER — Ambulatory Visit (INDEPENDENT_AMBULATORY_CARE_PROVIDER_SITE_OTHER): Payer: Medicare Other | Admitting: Family Medicine

## 2018-02-14 ENCOUNTER — Other Ambulatory Visit: Payer: Self-pay

## 2018-02-14 ENCOUNTER — Encounter: Payer: Self-pay | Admitting: Family Medicine

## 2018-02-14 VITALS — BP 154/82 | HR 76 | Temp 99.0°F | Resp 18 | Ht 70.0 in | Wt 272.2 lb

## 2018-02-14 DIAGNOSIS — L409 Psoriasis, unspecified: Secondary | ICD-10-CM | POA: Diagnosis not present

## 2018-02-14 DIAGNOSIS — E118 Type 2 diabetes mellitus with unspecified complications: Secondary | ICD-10-CM | POA: Diagnosis not present

## 2018-02-14 DIAGNOSIS — I10 Essential (primary) hypertension: Secondary | ICD-10-CM

## 2018-02-14 DIAGNOSIS — N184 Chronic kidney disease, stage 4 (severe): Secondary | ICD-10-CM | POA: Diagnosis not present

## 2018-02-14 DIAGNOSIS — E1142 Type 2 diabetes mellitus with diabetic polyneuropathy: Secondary | ICD-10-CM

## 2018-02-14 LAB — POCT GLYCOSYLATED HEMOGLOBIN (HGB A1C): Hemoglobin A1C: 7 % — AB (ref 4.0–5.6)

## 2018-02-14 NOTE — Progress Notes (Signed)
Chief Complaint  Patient presents with  . Diabetes    3 month follow up  . Hypertension    3 month follow up    HPI   Diabetes Mellitus: Patient presents for medication refills.  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  He is now compliant to his medications.    Lab Results  Component Value Date   HGBA1C 7.0 (A) 02/14/2018  His a1c 7.2 12/07/2017 seen in [facility] he reports that he is getting shot in his eyes by his Ophthalomologist  Patient has appt with Dr. Justin Mend, Nephrology, in one week He has creatinine of 4.56 doucmented in careeverywhere  CHEM Component Name 12/07/2017   Na 142  K 3.6  Cl 96 (L)  CO2  33.0 (H)  BUN 71 (H)  Creatinine 4.56 (H)  BUN/CR RATIO 16  EGFR Non-African American 14 (L)  EGFR African American 17 (L)  Anion Gap 13  Glucose 230 (H)  Calcium 10.2   CBC 12/07/2017 12/07/2017   Hgb 14.3  Hct 45.7  Plt 216     Psoriasis He states that he has itchy scalp and ear and needs refills of his creams and shampoos He would also like to establish with a Dermatologist He was so busy with all the other issues he did not have time and was lost to follow up  Hypertension: Patient here for follow-up of elevated blood pressure. He is not exercising and is adherent to low salt diet.  Blood pressure is not well controlled recently. He does not check his bp at home. Cardiac symptoms none. Patient denies chest pain, chest pressure/discomfort, claudication and exertional chest pressure/discomfort.  Cardiovascular risk factors: diabetes mellitus, dyslipidemia, hypertension, male gender, obesity (BMI >= 30 kg/m2), sedentary lifestyle and smoking/ tobacco exposure. History of target organ damage: chronic kidney disease. BP Readings from Last 3 Encounters:  02/14/18 (!) 154/82  01/31/18 (!) 152/98  01/04/18 126/86    Past Medical History:  Diagnosis Date  . Acute diastolic heart failure  ([place])   . CHF (congestive heart failure) ([place])   . Diabetes mellitus with nephropathy ([place]) 05/12/2012   Dr. Justin Mend follows  . Hemorrhoids 05/12/2012  . Hyperlipidemia   . Hypertension   . Lumbar spinal stenosis 05/03/2012   Mild with only right L4 nerve root encroachment, no neurogenic claudication   . Lumbar spondylosis 05/03/2012  . Meralgia paraesthetica 05/03/2012  . Meralgia paraesthetica 05/03/2012   On Lyrica which does improve pain.   Marland Kitchen Neuropathy in diabetes ([place]) 05/12/2012  . Obesity   . Pneumonia   . Retinopathy   . Sleep apnea    uses cpap  . Substance abuse ([place])   . Urinary incontinence 05/12/2012   Since the start of Sept. 2013     Current Outpatient Medications  Medication Sig Dispense Refill  . ACCU-CHEK SOFTCLIX LANCETS lancets Use as directed to check blood sugar 5 times a day before meals and as needed. 400 each 2  . amitriptyline (ELAVIL) 50 MG tablet Take 1 tablet (50 mg total) by mouth at bedtime. 90 tablet 1  . aspirin EC 81 MG tablet Take 1 tablet (81 mg total) by mouth daily. 90 tablet 3  . atorvastatin (LIPITOR) 80 MG tablet TAKE ONE (1) TABLET BY MOUTH EVERY DAY 90 tablet 2  . carvedilol (COREG) 25 MG tablet TAKE ONE (1) TABLET BY MOUTH TWO (2) TIMES DAILY WITH FOOD 180 tablet 1  .  cetirizine (ZYRTEC) 10 MG tablet Take 1 tablet (10 mg total) by mouth daily. 90 tablet 3  . furosemide (LASIX) 80 MG tablet Take 80 mg by mouth 3 (three) times daily. 2 Tablets in the AM and 1 Tablet in the PM    . gabapentin (NEURONTIN) 300 MG capsule Take 300 mg by mouth at bedtime.    . hydrALAZINE (APRESOLINE) 100 MG tablet TAKE ONE TABLET BY MOUTH THREE TIMES A DAY. 270 tablet 0  . hydrOXYzine (ATARAX/VISTARIL) 25 MG tablet Take 25 mg by mouth 3 (three) times daily as needed.    . Insulin Glargine (LANTUS SOLOSTAR) 100 UNIT/ML Solostar Pen INJECT 45 UNITS SUBCUTANEOUSLY EVERY MORNING AND INJECT 35 UNITS EVERY NIGHT 60 mL 3  . insulin lispro (HUMALOG KWIKPEN) 100 UNIT/ML KiwkPen  INJECT 0-60 UNITS INTO THE SKIN 3 TIMES DAILY WITH MEALS. INJECT INTO THE SKIN AS DIRECTED BASED ON SLIDING SCALE 15 mL 11  . metolazone (ZAROXOLYN) 5 MG tablet Take 1 tablet (5 mg total) by mouth daily. (Patient taking differently: Take 5 mg by mouth daily. Monday, Wednesday and Friday 30 mins prior to all other medications.) 90 tablet 1  . Potassium Chloride ER 20 MEQ TBCR Take 20 mEq by mouth 2 (two) times daily. 180 tablet 2  . verapamil (VERELAN PM) 180 MG 24 hr capsule Take 180 mg by mouth at bedtime.     Current Facility-Administered Medications  Medication Dose Route Frequency Provider Last Rate Last Dose  . triamcinolone acetonide (KENALOG) 10 MG/ML injection 10 mg  10 mg Other Once Landis Martins, DPM        Allergies: No Known Allergies  Past Surgical History:  Procedure Laterality Date  . ABDOMINAL SURGERY     Abscess I&D 2/2 infected hair  . AV FISTULA PLACEMENT Left 02/06/2016   Procedure: LEFT ARM RADIOCEPHALIC ARTERIOVENOUS (AV) FISTULA CREATION;  Surgeon: Rosetta Posner, MD;  Location: [facility];  Service: Vascular;  Laterality: Left;  . COLONOSCOPY W/ POLYPECTOMY     pt to bring records  . COLONOSCOPY WITH PROPOFOL N/A 06/05/2016   Procedure: COLONOSCOPY WITH PROPOFOL;  Surgeon: Doran Stabler, MD;  [facility];  Service: Gastroenterology;  Laterality: N/A;  . TONSILLECTOMY      Social History   Socioeconomic History  . Marital status: Single    Spouse name: Not on file  . Number of children: Not on file  . Years of education: Not on file  . Highest education level: Not on file  Occupational History  . Occupation: dietary services    Employer: [employer]  Social Needs  . Financial resource strain: Not on file  . Food insecurity:    Worry: Not on file    Inability: Not on file  . Transportation needs:    Medical: Not on file    Non-medical: Not on file  Tobacco Use  . Smoking status: Current Every Day Smoker    Packs/day: 0.50    Years: 30.00     Pack years: 15.00    Types: Cigarettes  . Smokeless tobacco: Never Used  . Tobacco comment: "Trying - hard to stop"  Substance and Sexual Activity  . Alcohol use: No    Comment: " about 40 ounce beer per month."  down to a beer every 3 weeks  . Drug use: No    Types: Marijuana, Cocaine    Comment: marijuana "laced with something"; today, stated "no" to question of illegal drug use 06/16/2016  . Sexual  activity: Not on file  Lifestyle  . Physical activity:    Days per week: Not on file    Minutes per session: Not on file  . Stress: Not on file  Relationships  . Social connections:    Talks on phone: Not on file    Gets together: Not on file    Attends religious service: Not on file    Active member of club or organization: Not on file    Attends meetings of clubs or organizations: Not on file    Relationship status: Not on file  Other Topics Concern  . Not on file  Social History Narrative  . Not on file    Family History  Problem Relation Age of Onset  . Prostate cancer Father   . Alcohol abuse Father   . Emphysema Father        smoked  . Colon cancer Neg Hx      ROS Review of Systems See HPI Constitution: No fevers or chills No malaise No diaphoresis Skin: No rash or itching Eyes: no blurry vision, no double vision GU: no dysuria or hematuria Neuro: no dizziness or headaches * all others reviewed and negative   Objective: Vitals:   02/14/18 1052 02/14/18 1154  BP: (!) 177/98 (!) 154/82  Pulse: 76   Resp: 18   Temp: 99 F (37.2 C)   TempSrc: Oral   SpO2: 97%   Weight: 272 lb 3.2 oz (123.5 kg)   Height: _0  (1.778 m)      Physical Exam  Assessment and Plan Sh[place]m was seen today for diabetes and hypertension.  Diagnoses and all orders for this visit:  Type 2 diabetes mellitus with complication, without long-term current use of insulin (HCC) -     POCT glycosylated hemoglobin (Hb A1C) -     Cancel: Microalbumin, urine -     HM Diabetes Foot  Exam  Psoriasis- referred to Dermatology and discussed follow up to establish care -     Ambulatory referral to Dermatology  Chronic kidney disease, stage IV (severe) ([place])-  Continue with Nephrology  Diabetic peripheral neuropathy associated with type 2 diabetes mellitus ([place])-  a1c stable  Essential hypertension, malignant- bp increasing up from baseline  Other orders -     Microalbumin, urine     [place]

## 2018-02-14 NOTE — Patient Instructions (Signed)
     IF you received an x-ray today, you will receive an invoice from [facility]. Please contact [facility] at [phone] with questions or concerns regarding your invoice.   IF you received labwork today, you will receive an invoice from [facility]. Please contact [facility] at [phone] with questions or concerns regarding your invoice.   Our billing staff will not be able to assist you with questions regarding bills from these companies.  You will be contacted with the lab results as soon as they are available. The fastest way to get your results is to activate your My Chart account. Instructions are located on the last page of this paperwork. If you have not heard from us regarding the results in 2 weeks, please contact this office.     

## 2018-02-15 LAB — MICROALBUMIN, URINE: MICROALBUM., U, RANDOM: 2759.8 ug/mL

## 2018-02-16 DIAGNOSIS — I5032 Chronic diastolic (congestive) heart failure: Secondary | ICD-10-CM | POA: Diagnosis not present

## 2018-02-16 DIAGNOSIS — I130 Hypertensive heart and chronic kidney disease with heart failure and stage 1 through stage 4 chronic kidney disease, or unspecified chronic kidney disease: Secondary | ICD-10-CM | POA: Diagnosis not present

## 2018-02-16 DIAGNOSIS — G4733 Obstructive sleep apnea (adult) (pediatric): Secondary | ICD-10-CM | POA: Diagnosis not present

## 2018-02-16 DIAGNOSIS — E1122 Type 2 diabetes mellitus with diabetic chronic kidney disease: Secondary | ICD-10-CM | POA: Diagnosis not present

## 2018-02-16 DIAGNOSIS — K219 Gastro-esophageal reflux disease without esophagitis: Secondary | ICD-10-CM | POA: Diagnosis not present

## 2018-02-16 DIAGNOSIS — H539 Unspecified visual disturbance: Secondary | ICD-10-CM | POA: Diagnosis not present

## 2018-02-16 DIAGNOSIS — M545 Low back pain: Secondary | ICD-10-CM | POA: Diagnosis not present

## 2018-02-16 DIAGNOSIS — L723 Sebaceous cyst: Secondary | ICD-10-CM | POA: Diagnosis not present

## 2018-02-16 DIAGNOSIS — J449 Chronic obstructive pulmonary disease, unspecified: Secondary | ICD-10-CM | POA: Diagnosis not present

## 2018-02-16 DIAGNOSIS — G8929 Other chronic pain: Secondary | ICD-10-CM | POA: Diagnosis not present

## 2018-02-16 DIAGNOSIS — Z794 Long term (current) use of insulin: Secondary | ICD-10-CM | POA: Diagnosis not present

## 2018-02-16 DIAGNOSIS — H548 Legal blindness, as defined in USA: Secondary | ICD-10-CM | POA: Diagnosis not present

## 2018-02-16 DIAGNOSIS — E1140 Type 2 diabetes mellitus with diabetic neuropathy, unspecified: Secondary | ICD-10-CM | POA: Diagnosis not present

## 2018-02-16 DIAGNOSIS — M1990 Unspecified osteoarthritis, unspecified site: Secondary | ICD-10-CM | POA: Diagnosis not present

## 2018-02-16 DIAGNOSIS — N184 Chronic kidney disease, stage 4 (severe): Secondary | ICD-10-CM | POA: Diagnosis not present

## 2018-02-22 ENCOUNTER — Other Ambulatory Visit: Payer: Self-pay | Admitting: Family Medicine

## 2018-02-23 NOTE — Telephone Encounter (Signed)
Refill request for hydralazine 100 mg # 270 with 0 refills approved.  Pt last ov 02/14/18.   Dgaddy, CMA

## 2018-03-08 DIAGNOSIS — L723 Sebaceous cyst: Secondary | ICD-10-CM | POA: Diagnosis not present

## 2018-03-16 DIAGNOSIS — I129 Hypertensive chronic kidney disease with stage 1 through stage 4 chronic kidney disease, or unspecified chronic kidney disease: Secondary | ICD-10-CM | POA: Diagnosis not present

## 2018-03-16 DIAGNOSIS — N2581 Secondary hyperparathyroidism of renal origin: Secondary | ICD-10-CM | POA: Diagnosis not present

## 2018-03-16 DIAGNOSIS — D631 Anemia in chronic kidney disease: Secondary | ICD-10-CM | POA: Diagnosis not present

## 2018-03-16 DIAGNOSIS — N184 Chronic kidney disease, stage 4 (severe): Secondary | ICD-10-CM | POA: Diagnosis not present

## 2018-03-28 ENCOUNTER — Other Ambulatory Visit: Payer: Self-pay | Admitting: Family Medicine

## 2018-03-28 NOTE — Telephone Encounter (Signed)
Refill request for hydralazine 100 mg #270 with 0 refills approved.  Pt has appt on 08/17/18. Dgaddy, CMA

## 2018-04-15 ENCOUNTER — Other Ambulatory Visit: Payer: Self-pay | Admitting: Family Medicine

## 2018-04-15 DIAGNOSIS — H60541 Acute eczematoid otitis externa, right ear: Secondary | ICD-10-CM

## 2018-04-21 DIAGNOSIS — I509 Heart failure, unspecified: Secondary | ICD-10-CM | POA: Diagnosis not present

## 2018-04-21 DIAGNOSIS — R0602 Shortness of breath: Secondary | ICD-10-CM | POA: Diagnosis not present

## 2018-04-21 DIAGNOSIS — G4733 Obstructive sleep apnea (adult) (pediatric): Secondary | ICD-10-CM | POA: Diagnosis not present

## 2018-04-21 DIAGNOSIS — E113512 Type 2 diabetes mellitus with proliferative diabetic retinopathy with macular edema, left eye: Secondary | ICD-10-CM | POA: Diagnosis not present

## 2018-04-29 DIAGNOSIS — H04122 Dry eye syndrome of left lacrimal gland: Secondary | ICD-10-CM | POA: Diagnosis not present

## 2018-04-29 DIAGNOSIS — H1132 Conjunctival hemorrhage, left eye: Secondary | ICD-10-CM | POA: Diagnosis not present

## 2018-05-04 ENCOUNTER — Other Ambulatory Visit: Payer: Self-pay | Admitting: Family Medicine

## 2018-05-04 NOTE — Telephone Encounter (Signed)
Potasium chloride 20 MEQ refill Last Refill:10/08/17 # 180 Last OV: 02/14/18 PCP: [place]  Last lab: 11/11/17

## 2018-05-06 ENCOUNTER — Ambulatory Visit (INDEPENDENT_AMBULATORY_CARE_PROVIDER_SITE_OTHER): Payer: Medicare Other | Admitting: Podiatry

## 2018-05-06 DIAGNOSIS — N183 Chronic kidney disease, stage 3 unspecified: Secondary | ICD-10-CM

## 2018-05-06 DIAGNOSIS — E0822 Diabetes mellitus due to underlying condition with diabetic chronic kidney disease: Secondary | ICD-10-CM | POA: Diagnosis not present

## 2018-05-06 DIAGNOSIS — M2011 Hallux valgus (acquired), right foot: Secondary | ICD-10-CM | POA: Diagnosis not present

## 2018-05-06 DIAGNOSIS — E1151 Type 2 diabetes mellitus with diabetic peripheral angiopathy without gangrene: Secondary | ICD-10-CM

## 2018-05-06 DIAGNOSIS — B351 Tinea unguium: Secondary | ICD-10-CM

## 2018-05-06 DIAGNOSIS — M2042 Other hammer toe(s) (acquired), left foot: Secondary | ICD-10-CM

## 2018-05-06 DIAGNOSIS — M2041 Other hammer toe(s) (acquired), right foot: Secondary | ICD-10-CM | POA: Diagnosis not present

## 2018-05-06 DIAGNOSIS — M2012 Hallux valgus (acquired), left foot: Secondary | ICD-10-CM | POA: Diagnosis not present

## 2018-05-06 DIAGNOSIS — N184 Chronic kidney disease, stage 4 (severe): Secondary | ICD-10-CM | POA: Diagnosis not present

## 2018-05-06 DIAGNOSIS — Z794 Long term (current) use of insulin: Secondary | ICD-10-CM

## 2018-05-06 NOTE — Progress Notes (Signed)
Subjective:  Patient ID: Jerry Cantrell, male    DOB: 04-22-1966,  MRN: [id number]  Chief Complaint  Patient presents with  . Diabetes    diabetic foot exam  . Nail Problem    bilateral elongated, thickened, painful toenails - overdue for trim    52 y.o. male presents  for diabetic foot care. Last AMBS was unkonwn. States he missed his last appointment from having to move.  Denies numbness and tingling in their feet. Denies cramping in legs and thighs.  Review of Systems: Negative except as noted in the HPI. Denies N/V/F/Ch.  Past Medical History:  Diagnosis Date  . Acute diastolic heart failure ([place])   . CHF (congestive heart failure) ([place])   . Diabetes mellitus with nephropathy ([place]) 05/12/2012   Dr. Justin Mend follows  . Hemorrhoids 05/12/2012  . Hyperlipidemia   . Hypertension   . Lumbar spinal stenosis 05/03/2012   Mild with only right L4 nerve root encroachment, no neurogenic claudication   . Lumbar spondylosis 05/03/2012  . Meralgia paraesthetica 05/03/2012  . Meralgia paraesthetica 05/03/2012   On Lyrica which does improve pain.   Marland Kitchen Neuropathy in diabetes ([place]) 05/12/2012  . Obesity   . Pneumonia   . Retinopathy   . Sleep apnea    uses cpap  . Substance abuse ([place])   . Urinary incontinence 05/12/2012   Since the start of Sept. 2013     Current Outpatient Medications:  .  ACCU-CHEK SOFTCLIX LANCETS lancets, Use as directed to check blood sugar 5 times a day before meals and as needed., Disp: 400 each, Rfl: 2 .  amitriptyline (ELAVIL) 50 MG tablet, Take 1 tablet (50 mg total) by mouth at bedtime., Disp: 90 tablet, Rfl: 1 .  aspirin EC 81 MG tablet, Take 1 tablet (81 mg total) by mouth daily., Disp: 90 tablet, Rfl: 3 .  atorvastatin (LIPITOR) 80 MG tablet, TAKE ONE TABLET BY MOUTH ONCE DAILY FOR CHOLESTEROL, Disp: 90 tablet, Rfl: 2 .  carvedilol (COREG) 25 MG tablet, TAKE ONE (1) TABLET BY MOUTH TWO (2) TIMES DAILY WITH FOOD, Disp: 180 tablet, Rfl: 1 .  cetirizine (ZYRTEC) 10 MG  tablet, Take 1 tablet (10 mg total) by mouth daily., Disp: 90 tablet, Rfl: 3 .  clobetasol cream (TEMOVATE) 0.05 %, APPLY TO AFFECED ARES(S) TOPICALLY TWICE DAILY, Disp: 30 g, Rfl: 0 .  furosemide (LASIX) 80 MG tablet, Take 80 mg by mouth 3 (three) times daily. 2 Tablets in the AM and 1 Tablet in the PM, Disp: , Rfl:  .  gabapentin (NEURONTIN) 300 MG capsule, Take 300 mg by mouth at bedtime., Disp: , Rfl:  .  hydrALAZINE (APRESOLINE) 100 MG tablet, TAKE ONE TABLET BY MOUTH THREE TIMES A DAY., Disp: 270 tablet, Rfl: 0 .  hydrOXYzine (ATARAX/VISTARIL) 25 MG tablet, Take 25 mg by mouth 3 (three) times daily as needed., Disp: , Rfl:  .  Insulin Glargine (LANTUS SOLOSTAR) 100 UNIT/ML Solostar Pen, INJECT 45 UNITS SUBCUTANEOUSLY EVERY MORNING AND INJECT 35 UNITS EVERY NIGHT, Disp: 60 mL, Rfl: 3 .  insulin lispro (HUMALOG KWIKPEN) 100 UNIT/ML KiwkPen, INJECT 0-60 UNITS INTO THE SKIN 3 TIMES DAILY WITH MEALS. INJECT INTO THE SKIN AS DIRECTED BASED ON SLIDING SCALE, Disp: 15 mL, Rfl: 11 .  metolazone (ZAROXOLYN) 5 MG tablet, Take 1 tablet (5 mg total) by mouth daily. (Patient taking differently: Take 5 mg by mouth daily. Monday, Wednesday and Friday 30 mins prior to all other medications.), Disp: 90 tablet, Rfl: 1 .  Potassium Chloride ER 20 MEQ TBCR, Take 20 mEq by mouth 2 (two) times daily., Disp: 180 tablet, Rfl: 2 .  verapamil (VERELAN PM) 180 MG 24 hr capsule, Take 180 mg by mouth at bedtime., Disp: , Rfl:   Current Facility-Administered Medications:  .  triamcinolone acetonide (KENALOG) 10 MG/ML injection 10 mg, 10 mg, Other, Once, Landis Martins, DPM  Social History   Tobacco Use  Smoking Status Current Every Day Smoker  . Packs/day: 0.50  . Years: 30.00  . Pack years: 15.00  . Types: Cigarettes  Smokeless Tobacco Never Used  Tobacco Comment   "Trying - hard to stop"    No Known Allergies Objective:  There were no vitals filed for this visit. There is no height or weight on file to  calculate BMI. Constitutional Well developed. Well nourished.  Vascular Dorsalis pedis pulses present 1+ bilaterally  Posterior tibial pulses absent bilaterally  Pedal hair growth diminished. Capillary refill normal to all digits.  No cyanosis or clubbing noted.  Neurologic Normal speech. Oriented to person, place, and time. Epicritic sensation to light touch grossly present bilaterally. Protective sensation with 5.07 monofilament  present bilaterally. Vibratory sensation present bilaterally.  Dermatologic Nails elongated, thickened, dystrophic. No open wounds. No skin lesions.  Orthopedic: Normal joint ROM without pain or crepitus bilaterally. HAV bilat Hammertoes bilat No bony tenderness.   Assessment:   1. Diabetes mellitus due to underlying condition with stage 3 chronic kidney disease, with long-term current use of insulin ([place])   2. Onychomycosis   3. Diabetes mellitus type 2 with peripheral artery disease ([place])   4. Acquired hallux valgus of both feet   5. Hammer toes of both feet    Plan:  Patient was evaluated and treated and all questions answered.  Diabetes with CKD, PAD Onychomycosis -Educated on diabetic footcare. Diabetic risk level 1 -Nails x10 debrided sharply and manually with large nail nipper and rotary burr.  -Rx for DM shoes for DM with HT and HAV  Procedure: Nail Debridement Rationale: Patient meets criteria for routine foot care due to PAD Type of Debridement: manual, sharp debridement. Instrumentation: Nail nipper, rotary burr. Number of Nails: 10   Return in about 6 months (around 11/04/2018) for Diabetic Foot Care.

## 2018-05-16 ENCOUNTER — Ambulatory Visit: Payer: Medicare Other | Admitting: Orthotics

## 2018-05-16 DIAGNOSIS — E0822 Diabetes mellitus due to underlying condition with diabetic chronic kidney disease: Secondary | ICD-10-CM

## 2018-05-16 DIAGNOSIS — H4710 Unspecified papilledema: Secondary | ICD-10-CM | POA: Diagnosis not present

## 2018-05-16 DIAGNOSIS — E1151 Type 2 diabetes mellitus with diabetic peripheral angiopathy without gangrene: Secondary | ICD-10-CM

## 2018-05-16 DIAGNOSIS — N183 Chronic kidney disease, stage 3 (moderate): Principal | ICD-10-CM

## 2018-05-16 DIAGNOSIS — H1045 Other chronic allergic conjunctivitis: Secondary | ICD-10-CM | POA: Diagnosis not present

## 2018-05-16 DIAGNOSIS — Z794 Long term (current) use of insulin: Principal | ICD-10-CM

## 2018-05-16 DIAGNOSIS — H538 Other visual disturbances: Secondary | ICD-10-CM | POA: Diagnosis not present

## 2018-05-16 DIAGNOSIS — B351 Tinea unguium: Secondary | ICD-10-CM

## 2018-05-16 NOTE — Progress Notes (Signed)

## 2018-05-18 DIAGNOSIS — I129 Hypertensive chronic kidney disease with stage 1 through stage 4 chronic kidney disease, or unspecified chronic kidney disease: Secondary | ICD-10-CM | POA: Diagnosis not present

## 2018-05-18 DIAGNOSIS — E1122 Type 2 diabetes mellitus with diabetic chronic kidney disease: Secondary | ICD-10-CM | POA: Diagnosis not present

## 2018-05-18 DIAGNOSIS — N2581 Secondary hyperparathyroidism of renal origin: Secondary | ICD-10-CM | POA: Diagnosis not present

## 2018-05-18 DIAGNOSIS — N185 Chronic kidney disease, stage 5: Secondary | ICD-10-CM | POA: Diagnosis not present

## 2018-05-18 DIAGNOSIS — E119 Type 2 diabetes mellitus without complications: Secondary | ICD-10-CM | POA: Diagnosis not present

## 2018-05-18 DIAGNOSIS — D631 Anemia in chronic kidney disease: Secondary | ICD-10-CM | POA: Diagnosis not present

## 2018-05-18 DIAGNOSIS — E785 Hyperlipidemia, unspecified: Secondary | ICD-10-CM | POA: Diagnosis not present

## 2018-05-23 DIAGNOSIS — I517 Cardiomegaly: Secondary | ICD-10-CM | POA: Diagnosis not present

## 2018-05-23 DIAGNOSIS — I120 Hypertensive chronic kidney disease with stage 5 chronic kidney disease or end stage renal disease: Secondary | ICD-10-CM | POA: Diagnosis not present

## 2018-05-23 DIAGNOSIS — I129 Hypertensive chronic kidney disease with stage 1 through stage 4 chronic kidney disease, or unspecified chronic kidney disease: Secondary | ICD-10-CM | POA: Diagnosis not present

## 2018-05-23 DIAGNOSIS — E65 Localized adiposity: Secondary | ICD-10-CM | POA: Diagnosis not present

## 2018-05-23 DIAGNOSIS — N184 Chronic kidney disease, stage 4 (severe): Secondary | ICD-10-CM | POA: Diagnosis not present

## 2018-05-23 DIAGNOSIS — Z794 Long term (current) use of insulin: Secondary | ICD-10-CM | POA: Diagnosis not present

## 2018-05-23 DIAGNOSIS — H548 Legal blindness, as defined in USA: Secondary | ICD-10-CM | POA: Diagnosis not present

## 2018-05-23 DIAGNOSIS — E1122 Type 2 diabetes mellitus with diabetic chronic kidney disease: Secondary | ICD-10-CM | POA: Diagnosis not present

## 2018-05-23 DIAGNOSIS — G4733 Obstructive sleep apnea (adult) (pediatric): Secondary | ICD-10-CM | POA: Diagnosis not present

## 2018-05-23 DIAGNOSIS — I70209 Unspecified atherosclerosis of native arteries of extremities, unspecified extremity: Secondary | ICD-10-CM | POA: Diagnosis not present

## 2018-05-23 DIAGNOSIS — R0989 Other specified symptoms and signs involving the circulatory and respiratory systems: Secondary | ICD-10-CM | POA: Diagnosis not present

## 2018-05-23 DIAGNOSIS — I440 Atrioventricular block, first degree: Secondary | ICD-10-CM | POA: Diagnosis not present

## 2018-05-23 DIAGNOSIS — N186 End stage renal disease: Secondary | ICD-10-CM | POA: Diagnosis not present

## 2018-05-23 DIAGNOSIS — R911 Solitary pulmonary nodule: Secondary | ICD-10-CM | POA: Diagnosis not present

## 2018-05-23 DIAGNOSIS — I700 Atherosclerosis of aorta: Secondary | ICD-10-CM | POA: Diagnosis not present

## 2018-05-23 DIAGNOSIS — I447 Left bundle-branch block, unspecified: Secondary | ICD-10-CM | POA: Diagnosis not present

## 2018-05-23 DIAGNOSIS — E1140 Type 2 diabetes mellitus with diabetic neuropathy, unspecified: Secondary | ICD-10-CM | POA: Diagnosis not present

## 2018-05-23 DIAGNOSIS — E11319 Type 2 diabetes mellitus with unspecified diabetic retinopathy without macular edema: Secondary | ICD-10-CM | POA: Diagnosis not present

## 2018-05-23 DIAGNOSIS — Z87891 Personal history of nicotine dependence: Secondary | ICD-10-CM | POA: Diagnosis not present

## 2018-05-23 DIAGNOSIS — Z01818 Encounter for other preprocedural examination: Secondary | ICD-10-CM | POA: Diagnosis not present

## 2018-05-23 DIAGNOSIS — E0821 Diabetes mellitus due to underlying condition with diabetic nephropathy: Secondary | ICD-10-CM | POA: Diagnosis not present

## 2018-05-23 DIAGNOSIS — K5730 Diverticulosis of large intestine without perforation or abscess without bleeding: Secondary | ICD-10-CM | POA: Diagnosis not present

## 2018-05-24 ENCOUNTER — Other Ambulatory Visit: Payer: Self-pay | Admitting: Family Medicine

## 2018-05-24 DIAGNOSIS — Z01818 Encounter for other preprocedural examination: Secondary | ICD-10-CM | POA: Diagnosis not present

## 2018-05-25 DIAGNOSIS — H35372 Puckering of macula, left eye: Secondary | ICD-10-CM | POA: Diagnosis not present

## 2018-05-25 DIAGNOSIS — H4710 Unspecified papilledema: Secondary | ICD-10-CM | POA: Diagnosis not present

## 2018-05-25 DIAGNOSIS — H3582 Retinal ischemia: Secondary | ICD-10-CM | POA: Diagnosis not present

## 2018-05-25 DIAGNOSIS — E113511 Type 2 diabetes mellitus with proliferative diabetic retinopathy with macular edema, right eye: Secondary | ICD-10-CM | POA: Diagnosis not present

## 2018-05-25 DIAGNOSIS — E113512 Type 2 diabetes mellitus with proliferative diabetic retinopathy with macular edema, left eye: Secondary | ICD-10-CM | POA: Diagnosis not present

## 2018-05-27 ENCOUNTER — Other Ambulatory Visit: Payer: Self-pay | Admitting: Family Medicine

## 2018-05-27 DIAGNOSIS — R05 Cough: Secondary | ICD-10-CM

## 2018-05-27 DIAGNOSIS — R059 Cough, unspecified: Secondary | ICD-10-CM

## 2018-05-27 NOTE — Telephone Encounter (Signed)
Flonase refill Last refill 01/19/18  #48g  3 refills [facility] LOV 06/19/17 Pharmacy [facility]. [facility]

## 2018-06-21 ENCOUNTER — Other Ambulatory Visit: Payer: Self-pay | Admitting: Family Medicine

## 2018-06-21 DIAGNOSIS — H60541 Acute eczematoid otitis externa, right ear: Secondary | ICD-10-CM

## 2018-06-21 DIAGNOSIS — R05 Cough: Secondary | ICD-10-CM

## 2018-06-21 DIAGNOSIS — R059 Cough, unspecified: Secondary | ICD-10-CM

## 2018-06-21 NOTE — Telephone Encounter (Signed)
Requested Prescriptions  Pending Prescriptions Disp Refills  . fluticasone (FLONASE) 50 MCG/ACT nasal spray [Pharmacy Med Name: FLUTICASONE  50MCG/ACT NASAL SPRAY] 16 g 0    Sig: USE TWO SPRAYS INTO EACH NOSTRILS DAILY     Ear, Nose, and Throat: Nasal Preparations - Corticosteroids Passed - 06/21/2018  9:44 AM      Passed - Valid encounter within last 12 months    Recent Outpatient Visits          4 months ago Type 2 diabetes mellitus with complication, without long-term current use of insulin ([place])   [facility] at [place], MD   7 months ago Insulin dependent type 2 diabetes mellitus, uncontrolled ([place])   Primary Care at [facility], Arlie Solomons, MD   1 year ago Chronic kidney disease, stage IV (severe) ([place])   Primary Care at [facility], Arlie Solomons, MD   1 year ago Mold exposure   Primary Care at [facility], Arlie Solomons, MD   1 year ago Wellness examination   Primary Care at [place], MD      Future Appointments            In 1 month Forrest Moron, MD Primary [facility] at [place], [place]   In 6 months Parrett, Fonnie Mu, NP [facility]         . clobetasol cream (TEMOVATE) 0.05 % [Pharmacy Med Name: CLOBETASOL PROPIONAT 0.05% CREAM] 30 g 0    Sig: APPLY TO AFFECED ARES(S) TOPICALLY TWICE DAILY     Dermatology:  Corticosteroids Passed - 06/21/2018  9:44 AM      Passed - Valid encounter within last 12 months    Recent Outpatient Visits          4 months ago Type 2 diabetes mellitus with complication, without long-term current use of insulin ([place])   Primary Care at [place], MD   7 months ago Insulin dependent type 2 diabetes mellitus, uncontrolled ([place])   Primary Care at [facility], Arlie Solomons, MD   1 year ago Chronic kidney disease, stage IV (severe) ([place])   Primary Care at [facility], Arlie Solomons, MD   1 year ago Mold exposure   Primary Care at [facility], Arlie Solomons, MD   1 year ago Wellness examination   Primary  Care at [place], MD      Future Appointments            In 1 month Forrest Moron, MD Primary Care at [place], [facility]   In 6 months Parrett, Fonnie Mu, NP [facility]

## 2018-06-27 ENCOUNTER — Ambulatory Visit: Payer: Self-pay | Admitting: *Deleted

## 2018-06-27 NOTE — Telephone Encounter (Signed)
He called in and told the agent he was hallucinating and having paralysis for 2 weeks.  They called the ambulance and they had to come in through the window because he could not move.   He did not let them take him to the ED.  When he was transferred to me I noticed his voice was slurred but understandable.  See triage notes but in summary:  He started out telling me he was bit by a brown recluse spider last year.   "I'm having the same symptoms now that I had then".  "I can't move".  "I can't get out of the bed".   "I don't know where I'm at.  "I'm paralyzed".  "This is the 3rd time in 2 weeks this has happened".   "This is not normal".   "I'm scarred and don't know what is going on other than I know this is not normal".   He denies drug or alcohol use when questioned.  He became short with me and a little argumentative.   As I continue talking with him he then said that he was paralyzed on Saturday.   He is insisting on wanting to come to Primary Care at [facility] instead of going to the ED when I instructed him to call 911 and be taken to the hospital.  "I'm not going up there to sit for hours".    He then said,  "I can't be seen today".   "It has to be tomorrow".   He said something about moving furniture today for a friend so they can get the dialysis equipment in the house. I then called the flow coordinator at [place] and spoke with Hassan Rowan.   It was advised [facility] or [facility] but that his symptoms can not being taken care of at the office which I had already told the pt. After talking with Hassan Rowan I let the pt know he needed to go to the [facility] due to the hallucinations or the [facility] if he did not want to go to the ED which he is refusing to do.  He then said,  "Ok".  "Thank you".  The call was ended.   I'm not sure if he will follow the advice given or not.    Reason for Disposition . Very strange or paranoid behavior    C/O being  "paralyzed for the 3rd time in 2 weeks".  Having hallucinations "I don't know where I'm at".  Answer Assessment - Initial Assessment Questions 1. LEVEL OF CONSCIOUSNESS: "How is he (she, the patient) acting right now?" (e.g., alert-oriented, confused, lethargic, stuporous, comatose)     Last year I was bit by a brown recluse.   I'm having the same symptoms.   I can't move.   I can't get out of the bed.   I don't know where I'm at.   I'm paralyzed.   This is the 3rd time in 2 weeks.       2. ONSET: "When did the confusion start?"  (minutes, hours, days)     3 times in 2 weeks.   I can't go anywhere.   I don't even know how long I've been in the bed.   I'm trying to get it together.   I'm scarred.   This is not normal.     3. PATTERN "Does this come and go, or has it been constant since it started?"  "Is it present  now?"     Intermittent 4. ALCOHOL or DRUGS: "Has he been drinking alcohol or taking any drugs?"      No 5. NARCOTIC MEDICATIONS: "Has he been receiving any narcotic medications?" (e.g., morphine, Vicodin)     No 6. CAUSE: "What do you think is causing the confusion?"      His speech is slurred.    I don't know if it's a chemical imbalance or what.    7. OTHER SYMPTOMS: "Are there any other symptoms?" (e.g., difficulty breathing, headache, fever, weakness)     *No Answer*  Protocols used: [place]

## 2018-06-28 ENCOUNTER — Emergency Department (HOSPITAL_COMMUNITY)
Admission: EM | Admit: 2018-06-28 | Discharge: 2018-06-28 | Disposition: A | Discharge status: Home / Self Care | Payer: Medicare Other | Attending: Emergency Medicine | Admitting: Emergency Medicine

## 2018-06-28 ENCOUNTER — Encounter (HOSPITAL_COMMUNITY): Payer: Self-pay | Admitting: Emergency Medicine

## 2018-06-28 ENCOUNTER — Ambulatory Visit (HOSPITAL_COMMUNITY): Admission: EM | Admit: 2018-06-28 | Discharge: 2018-06-28 | Payer: Medicare Other

## 2018-06-28 DIAGNOSIS — R51 Headache: Secondary | ICD-10-CM | POA: Diagnosis not present

## 2018-06-28 DIAGNOSIS — Z01818 Encounter for other preprocedural examination: Secondary | ICD-10-CM | POA: Diagnosis not present

## 2018-06-28 DIAGNOSIS — M79604 Pain in right leg: Secondary | ICD-10-CM | POA: Insufficient documentation

## 2018-06-28 DIAGNOSIS — R443 Hallucinations, unspecified: Secondary | ICD-10-CM | POA: Insufficient documentation

## 2018-06-28 DIAGNOSIS — Z5321 Procedure and treatment not carried out due to patient leaving prior to being seen by health care provider: Secondary | ICD-10-CM | POA: Diagnosis not present

## 2018-06-28 DIAGNOSIS — N185 Chronic kidney disease, stage 5: Secondary | ICD-10-CM | POA: Diagnosis not present

## 2018-06-28 LAB — CBC
HEMATOCRIT: 42.7 % (ref 39.0–52.0)
HEMOGLOBIN: 13.3 g/dL (ref 13.0–17.0)
MCH: 29.7 pg (ref 26.0–34.0)
MCHC: 31.1 g/dL (ref 30.0–36.0)
MCV: 95.3 fL (ref 80.0–100.0)
NRBC: 0 % (ref 0.0–0.2)
Platelets: 153 10*3/uL (ref 150–400)
RBC: 4.48 MIL/uL (ref 4.22–5.81)
RDW: 12.2 % (ref 11.5–15.5)
WBC: 7.8 10*3/uL (ref 4.0–10.5)

## 2018-06-28 LAB — COMPREHENSIVE METABOLIC PANEL
ALBUMIN: 3.3 g/dL — AB (ref 3.5–5.0)
ALT: 16 U/L (ref 0–44)
ANION GAP: 13 (ref 5–15)
AST: 15 U/L (ref 15–41)
Alkaline Phosphatase: 58 U/L (ref 38–126)
BUN: 74 mg/dL — ABNORMAL HIGH (ref 6–20)
CALCIUM: 9.9 mg/dL (ref 8.9–10.3)
CHLORIDE: 98 mmol/L (ref 98–111)
CO2: 29 mmol/L (ref 22–32)
Creatinine, Ser: 6.91 mg/dL — ABNORMAL HIGH (ref 0.61–1.24)
GFR calc Af Amer: 9 mL/min — ABNORMAL LOW (ref >60)
GFR calc non Af Amer: 8 mL/min — ABNORMAL LOW (ref >60)
GLUCOSE: 358 mg/dL — AB (ref 70–99)
POTASSIUM: 4.3 mmol/L (ref 3.5–5.1)
SODIUM: 140 mmol/L (ref 135–145)
Total Bilirubin: 0.6 mg/dL (ref 0.3–1.2)
Total Protein: 6.3 g/dL — ABNORMAL LOW (ref 6.5–8.1)

## 2018-06-28 LAB — ETHANOL: Alcohol, Ethyl (B): <10 mg/dL (ref <10)

## 2018-06-28 LAB — RAPID URINE DRUG SCREEN, HOSP PERFORMED
AMPHETAMINES: NOT DETECTED
BENZODIAZEPINES: NOT DETECTED
Barbiturates: NOT DETECTED
COCAINE: NOT DETECTED
Opiates: NOT DETECTED
Tetrahydrocannabinol: NOT DETECTED

## 2018-06-28 LAB — CBG MONITORING, ED: Glucose-Capillary: 331 mg/dL — ABNORMAL HIGH (ref 70–99)

## 2018-06-28 NOTE — ED Notes (Signed)
Patient requested CBG to be checked due to not feeling well. CBG 331. Patient did not bring insulin with him.

## 2018-06-28 NOTE — ED Notes (Signed)
Still unable to locate in waiting area.

## 2018-06-28 NOTE — ED Notes (Signed)
Patient did not answer when EMT called for rooming

## 2018-06-28 NOTE — ED Notes (Signed)
Results reviewed.  No changes in acuity at this time 

## 2018-06-28 NOTE — ED Notes (Signed)
Patient did not answer when called for rooming.  Will d/c.

## 2018-06-28 NOTE — ED Notes (Signed)
This RN attempted to call patient.  Patient states he is too weak and tired to wait any longer so he left.  Patient states he will not be returning.  This RN made him aware that she would like patient to return to ER for evaluation and will find him a bed if he returns.  Patient asked for update on labs, this RN stated she could not give information over the phone, and patient hung up on this RN.  Unsure if patient will return or not.  Will keep in system for 1 hour and then d/c if patient does not return

## 2018-06-28 NOTE — ED Notes (Signed)
Due to elevated creatinine and sugar, will upgrade acuity and prioritize rooming

## 2018-06-28 NOTE — ED Notes (Signed)
Patient unable to be located in waiting room, per lobby staff.  Due to irregular labs, will attempt to call and get patient to return

## 2018-06-28 NOTE — ED Triage Notes (Signed)
Pt presents to ED for assessment of episodes of hallucinations intermittently for a year, but worse the past week.  Patient states he suddenly can't figure out where he is, he can't move, and has a headache.  States he was bit by a brown recluse a year ago, and that might be it.  When asked if he's having any other physical symptoms, patient states he has chronic neuropathy, but he's been having right leg pain, and is concerned for a blood clot.  Patient concerned for possible tumor in his brain.  Patient denies SI/HI.  States he is not sleeping very much here lately due to chronic pain.

## 2018-06-28 NOTE — ED Notes (Signed)
Unable to locate in waiting room

## 2018-06-29 ENCOUNTER — Emergency Department (HOSPITAL_COMMUNITY): Payer: Medicare Other

## 2018-06-29 ENCOUNTER — Emergency Department (HOSPITAL_BASED_OUTPATIENT_CLINIC_OR_DEPARTMENT_OTHER): Payer: Medicare Other

## 2018-06-29 ENCOUNTER — Encounter (HOSPITAL_COMMUNITY): Payer: Self-pay | Admitting: *Deleted

## 2018-06-29 ENCOUNTER — Other Ambulatory Visit: Payer: Self-pay

## 2018-06-29 ENCOUNTER — Emergency Department (HOSPITAL_COMMUNITY)
Admission: EM | Admit: 2018-06-29 | Discharge: 2018-06-29 | Disposition: A | Discharge status: Home / Self Care | Payer: Medicare Other | Attending: Emergency Medicine | Admitting: Emergency Medicine

## 2018-06-29 DIAGNOSIS — R52 Pain, unspecified: Secondary | ICD-10-CM

## 2018-06-29 DIAGNOSIS — N184 Chronic kidney disease, stage 4 (severe): Secondary | ICD-10-CM | POA: Diagnosis not present

## 2018-06-29 DIAGNOSIS — I5032 Chronic diastolic (congestive) heart failure: Secondary | ICD-10-CM | POA: Diagnosis not present

## 2018-06-29 DIAGNOSIS — M79604 Pain in right leg: Secondary | ICD-10-CM | POA: Insufficient documentation

## 2018-06-29 DIAGNOSIS — E785 Hyperlipidemia, unspecified: Secondary | ICD-10-CM | POA: Insufficient documentation

## 2018-06-29 DIAGNOSIS — R51 Headache: Secondary | ICD-10-CM | POA: Diagnosis not present

## 2018-06-29 DIAGNOSIS — Z79899 Other long term (current) drug therapy: Secondary | ICD-10-CM | POA: Diagnosis not present

## 2018-06-29 DIAGNOSIS — R4781 Slurred speech: Secondary | ICD-10-CM | POA: Diagnosis not present

## 2018-06-29 DIAGNOSIS — J449 Chronic obstructive pulmonary disease, unspecified: Secondary | ICD-10-CM | POA: Insufficient documentation

## 2018-06-29 DIAGNOSIS — R519 Headache, unspecified: Secondary | ICD-10-CM

## 2018-06-29 DIAGNOSIS — R443 Hallucinations, unspecified: Secondary | ICD-10-CM | POA: Diagnosis not present

## 2018-06-29 DIAGNOSIS — F17210 Nicotine dependence, cigarettes, uncomplicated: Secondary | ICD-10-CM | POA: Diagnosis not present

## 2018-06-29 DIAGNOSIS — Z7982 Long term (current) use of aspirin: Secondary | ICD-10-CM | POA: Diagnosis not present

## 2018-06-29 DIAGNOSIS — Z794 Long term (current) use of insulin: Secondary | ICD-10-CM | POA: Diagnosis not present

## 2018-06-29 DIAGNOSIS — I130 Hypertensive heart and chronic kidney disease with heart failure and stage 1 through stage 4 chronic kidney disease, or unspecified chronic kidney disease: Secondary | ICD-10-CM | POA: Diagnosis not present

## 2018-06-29 DIAGNOSIS — E1140 Type 2 diabetes mellitus with diabetic neuropathy, unspecified: Secondary | ICD-10-CM | POA: Diagnosis not present

## 2018-06-29 LAB — CBC WITH DIFFERENTIAL/PLATELET
Abs Immature Granulocytes: 0.02 10*3/uL (ref 0.00–0.07)
BASOS ABS: 0 10*3/uL (ref 0.0–0.1)
Basophils Relative: 0 %
EOS PCT: 4 %
Eosinophils Absolute: 0.3 10*3/uL (ref 0.0–0.5)
HCT: 41.3 % (ref 39.0–52.0)
HEMOGLOBIN: 12.7 g/dL — AB (ref 13.0–17.0)
Immature Granulocytes: 0 %
LYMPHS ABS: 1.6 10*3/uL (ref 0.7–4.0)
LYMPHS PCT: 22 %
MCH: 29.3 pg (ref 26.0–34.0)
MCHC: 30.8 g/dL (ref 30.0–36.0)
MCV: 95.2 fL (ref 80.0–100.0)
Monocytes Absolute: 0.5 10*3/uL (ref 0.1–1.0)
Monocytes Relative: 7 %
NEUTROS PCT: 67 %
NRBC: 0 % (ref 0.0–0.2)
Neutro Abs: 4.8 10*3/uL (ref 1.7–7.7)
PLATELETS: 152 10*3/uL (ref 150–400)
RBC: 4.34 MIL/uL (ref 4.22–5.81)
RDW: 12.3 % (ref 11.5–15.5)
WBC: 7.2 10*3/uL (ref 4.0–10.5)

## 2018-06-29 LAB — BASIC METABOLIC PANEL
ANION GAP: 12 (ref 5–15)
BUN: 75 mg/dL — ABNORMAL HIGH (ref 6–20)
CHLORIDE: 100 mmol/L (ref 98–111)
CO2: 29 mmol/L (ref 22–32)
CREATININE: 6.63 mg/dL — AB (ref 0.61–1.24)
Calcium: 9.5 mg/dL (ref 8.9–10.3)
GFR calc non Af Amer: 9 mL/min — ABNORMAL LOW (ref >60)
GFR, EST AFRICAN AMERICAN: 10 mL/min — AB (ref >60)
Glucose, Bld: 164 mg/dL — ABNORMAL HIGH (ref 70–99)
Potassium: 3.5 mmol/L (ref 3.5–5.1)
SODIUM: 141 mmol/L (ref 135–145)

## 2018-06-29 LAB — CBG MONITORING, ED: GLUCOSE-CAPILLARY: 141 mg/dL — AB (ref 70–99)

## 2018-06-29 NOTE — ED Triage Notes (Signed)
Pt in c/o "same thing as yesterday", describes hallucinations and reminds him of when he was bit by a brown recluse, pt had to leave without being seen yesterday, was called and told to come back due to abnormal lab work

## 2018-06-29 NOTE — ED Notes (Signed)
Patient transported to CT 

## 2018-06-29 NOTE — Consult Note (Signed)
  Tele psych Assessment   Jerry Cantrell, 52 y.o., male patient seen via tele psych by TTS and this provider; chart reviewed and consulted with Dr. Dwyane Dee on 06/29/18.  On evaluation [name] reports at times when he is sleep or first waking in morning he feels like he is confused, paralyzed, and unable to talk.  Patient denies suicidal ideation "never", homicidal ideation, psychosis, and paranoia.  Patient has history of diabetes and thinks that the way he feels may be related to that.    During evaluation Mohit Zirbes is alert/oriented x 4; calm/cooperative; and mood is congruent with affect.  She does not appear to be responding to internal/external stimuli or delusional thoughts; and denies suicidal/self-harm/homicidal ideation, psychosis, and paranoia.  Patient answered question appropriately.  For detailed note see TTS tele assessment note  Recommendations:  Follow up with PCP.  Can give resources for outpatient psychiatric services  Disposition:  Patient is psychiatrically cleared No evidence of imminent risk to self or others at present.   Patient does not meet criteria for psychiatric inpatient admission. Supportive therapy provided about ongoing stressors. Discussed crisis plan, support from social network, calling 911, coming to the Emergency Department, and calling Suicide Hotline.   Spoke with Dr. Rodolph Bong;  informed of above recommendation and disposition  Earleen Newport, NP

## 2018-06-29 NOTE — ED Provider Notes (Signed)
[facility] EMERGENCY DEPARTMENT Provider Note   CSN: [id number] Arrival date & time: 06/29/18  1032     History   Chief Complaint Chief Complaint  Patient presents with  . Hallucinations    HPI Jerry Cantrell is a 52 y.o. male with an extensive past medical history as below including hypertension, hyperlipidemia, insulin-dependent type 2 diabetes, diabetic neuropathy, prior substance abuse, CKD who presents emergency department today for multiple complaints.   Patient reported to the emergency department yesterday but left prior to being seen.  Labs were drawn and resulted with an elevated blood sugar (358) as well as creatinine (6.91(.  Patient was called as a result of these and instructed to come back to the emergency department.  Patient reports that he has known stage V kidney disease and is followed by Dr.Waun[place]a Justin Mend of [facility].  He reports that he had a fistula placed in the left upper extremity approximately 1 year ago.  He is holding off on dialysis at this time until he is recommended for it.  He reports his last visit was earlier this month.  He is currently being seen over at [facility] for possible renal transplant and there is documentation for this. His most recent Cr prior to yesterday was at [facility] on 9/23 and was 7.35. Patient reports he is also a insulin-dependent diabetic.  His blood sugars yesterday were in the 300s and he was not in DKA.  He reports he did not take his insulin that morning.  When he arrived home he did take them.  He denies any current nausea/vomiting or abdominal pain.  Patient did present yesterday for multiple different reasons.  He reports that on Thursday 10/24 and Saturday, 10/26 he woke out of his bed and was not sure where he was.  He reports that he had difficulty slurred speech but was understandable.  He notes that he was paralyzed from the neck down.  There is a telephone encounter on 10/28 with the same statements where  he quoted "I cannot move, I don't know where I am at, I am paralyzed."  Patient reports no recent encounters of this.  He states this feels like when he was bitten by a brown recluse 1 year ago.  He reports his episodes last for about 2 hours before being completely resolved.  He notes he did not want to come to the hospital after these episodes as it was homecoming night and thought it would be busy.  He also reports he wants a scan of his head as he has been having headaches over the last 1 month.  He reports they have been every second or third day and are generalized lasting approximately 30 minutes and relieved without intervention.  Patient denies current headache.  Headaches are not associated with any neurologic symptoms.  The patient also notes intermittent pain of his right lower leg.  Thank you pain is located on the posterior hamstring and occurs while lying down.  Pain does not occur with activity.  He denies any hip, knee, ankle pain.  No lower extremity swelling.  No skin changes.  He denies injury.  No new numbness/tingling/weakness.  He denies any back pain associate with this.  Patient denies any current complaints or pain.  No associated fever, chills, chest pain, shortness of breath, cough, abdominal pain, nausea/vomiting/diarrhea, urinary symptoms (he is still able to produce urine), visual changes, facial droop, aphasia, tinnitus, neck pain, back pain, focal weakness, numbness/tingling of the extremities.  He denies head trauma or loss of consciousness.  He denies current alcohol or drug use and states he cannot as he is waiting for a transplant.  He denies auditory hallucinations.  No SI/HI.    HPI  Past Medical History:  Diagnosis Date  . Acute diastolic heart failure ([place])   . CHF (congestive heart failure) ([place])   . Diabetes mellitus with nephropathy ([place]) 05/12/2012   Dr. Justin Mend follows  . Hemorrhoids 05/12/2012  . Hyperlipidemia   . Hypertension   . Lumbar spinal stenosis  05/03/2012   Mild with only right L4 nerve root encroachment, no neurogenic claudication   . Lumbar spondylosis 05/03/2012  . Meralgia paraesthetica 05/03/2012  . Meralgia paraesthetica 05/03/2012   On Lyrica which does improve pain.   Marland Kitchen Neuropathy in diabetes ([place]) 05/12/2012  . Obesity   . Pneumonia   . Retinopathy   . Sleep apnea    uses cpap  . Substance abuse ([place])   . Urinary incontinence 05/12/2012   Since the start of Sept. 2013     Patient Active Problem List   Diagnosis Date Noted  . COPD (chronic obstructive pulmonary disease) ([place]) 02/10/2017  . Chronic kidney disease 01/06/2016  . CKD (chronic kidney disease), stage III ([place])   . Type 2 diabetes, uncontrolled, with neuropathy ([place])   . Marijuana abuse   . Tobacco abuse   . Essential hypertension, malignant   . Chronic kidney disease, stage IV (severe) ([place])   . Legally blind 10/13/2015  . Cocaine use disorder, moderate, dependence ([place]) 07/02/2015  . Accelerated hypertension   . Uncontrolled type 2 diabetes mellitus with ketoacidosis without coma ([place])   . Cocaine abuse ([place])   . Hypertensive emergency 06/06/2015  . Chronic respiratory failure ([place])   . Pulmonary nodule 05/22/2015  . Thrombocytopenia ([place]) 12/04/2013  . Diastolic heart failure secondary to hypertension ([place]) 11/30/2013  . Radiculopathy with lower extremity symptoms 11/28/2013  . Depression 06/16/2013  . Chronic recurrent major depressive disorder ([place]) 06/16/2013  . Proliferative diabetic retinopathy ([place]) 06/08/2013  . Diabetic macular edema of left eye, with cataract, associated with type 2 diabetes mellitus ([place]) 06/08/2013  . Meibomianitis 06/08/2013  . Erectile dysfunction associated with type 2 diabetes mellitus ([place]) 04/22/2013  . Decreased peripheral vision of left eye 04/12/2013  . Plantar fasciitis of right foot 04/12/2013  . Obstructive sleep apnea 02/02/2013  . Diabetic peripheral neuropathy associated with type 2 diabetes mellitus ([place])  12/27/2012  . Abnormality of gait 12/06/2012  . Family history of malignant neoplasm of gastrointestinal tract 10/10/2012  . Personal history of colonic polyps 10/10/2012  . Skin tag 08/18/2012  . Chronic low back pain 07/15/2012  . Preventative health care 06/02/2012  . Hyperlipemia 05/12/2012  . Hypertension 05/12/2012  . Hemorrhoids 05/12/2012  . Blood per rectum 05/12/2012  . Meralgia paraesthetica 05/03/2012  . Lumbar spondylosis 05/03/2012  . Lumbar spinal stenosis 05/03/2012  . Insulin dependent type 2 diabetes mellitus, uncontrolled ([place]) 05/13/1999    Past Surgical History:  Procedure Laterality Date  . ABDOMINAL SURGERY     Abscess I&D 2/2 infected hair  . AV FISTULA PLACEMENT Left 02/06/2016   Procedure: LEFT ARM RADIOCEPHALIC ARTERIOVENOUS (AV) FISTULA CREATION;  Surgeon: Rosetta Posner, MD;  Location: [facility];  Service: Vascular;  Laterality: Left;  . COLONOSCOPY W/ POLYPECTOMY     pt to bring records  . COLONOSCOPY WITH PROPOFOL N/A 06/05/2016   Procedure: COLONOSCOPY WITH PROPOFOL;  Surgeon: Doran Stabler, MD;  Location: [facility];  Service: Gastroenterology;  Laterality: N/A;  . TONSILLECTOMY          Home Medications    Prior to Admission medications   Medication Sig Start Date End Date Taking? Authorizing Provider  ACCU-CHEK SOFTCLIX LANCETS lancets Use as directed to check blood sugar 5 times a day before meals and as needed. 11/22/17   Forrest Moron, MD  amitriptyline (ELAVIL) 50 MG tablet Take 1 tablet (50 mg total) by mouth at bedtime. 06/19/17   Forrest Moron, MD  aspirin EC 81 MG tablet Take 1 tablet (81 mg total) by mouth daily. 02/08/17   Fay Records, MD  atorvastatin (LIPITOR) 80 MG tablet TAKE ONE TABLET BY MOUTH ONCE DAILY FOR CHOLESTEROL 04/15/18   Delia Chimes A, MD  carvedilol (COREG) 25 MG tablet TAKE ONE (1) TABLET BY MOUTH TWO (2) TIMES DAILY WITH FOOD 06/19/17   Delia Chimes A, MD  cetirizine (ZYRTEC) 10 MG tablet TAKE ONE TABLET  BY MOUTH ONCE DAILY FOR ALLERGIES 05/24/18   Delia Chimes A, MD  clobetasol cream (TEMOVATE) 0.05 % APPLY TO AFFECED ARES(S) TOPICALLY TWICE DAILY 06/21/18   Forrest Moron, MD  fluticasone (FLONASE) 50 MCG/ACT nasal spray USE TWO SPRAYS INTO EACH NOSTRILS DAILY 06/21/18   Forrest Moron, MD  furosemide (LASIX) 80 MG tablet Take 80 mg by mouth 3 (three) times daily. 2 Tablets in the AM and 1 Tablet in the PM    [provider]  gabapentin (NEURONTIN) 300 MG capsule Take 300 mg by mouth at bedtime.    [provider]  hydrALAZINE (APRESOLINE) 100 MG tablet TAKE ONE TABLET BY MOUTH THREE TIMES A DAY. 03/28/18   Delia Chimes A, MD  hydrOXYzine (ATARAX/VISTARIL) 25 MG tablet Take 25 mg by mouth 3 (three) times daily as needed.    [provider]  Insulin Glargine (LANTUS SOLOSTAR) 100 UNIT/ML Solostar Pen INJECT 45 UNITS SUBCUTANEOUSLY EVERY MORNING AND INJECT 35 UNITS EVERY NIGHT 11/11/17   Delia Chimes A, MD  insulin lispro (HUMALOG KWIKPEN) 100 UNIT/ML KiwkPen INJECT 0-60 UNITS INTO THE SKIN 3 TIMES DAILY WITH MEALS. INJECT INTO THE SKIN AS DIRECTED BASED ON SLIDING SCALE 11/11/17   Delia Chimes A, MD  metolazone (ZAROXOLYN) 5 MG tablet Take 1 tablet (5 mg total) by mouth daily. Patient taking differently: Take 5 mg by mouth daily. Monday, Wednesday and Friday 30 mins prior to all other medications. 06/13/17   Forrest Moron, MD  Potassium Chloride ER 20 MEQ TBCR Take 20 mEq by mouth 2 (two) times daily. 10/08/17   Forrest Moron, MD  potassium chloride SA (K-DUR,KLOR-CON) 20 MEQ tablet TAKE ONE TABLET BY MOUTH TWICE DAILY 05/06/18   Delia Chimes A, MD  verapamil (VERELAN PM) 180 MG 24 hr capsule Take 180 mg by mouth at bedtime.    [provider]    Family History Family History  Problem Relation Age of Onset  . Prostate cancer Father   . Alcohol abuse Father   . Emphysema Father        smoked  . Colon cancer Neg Hx     Social History Social  History   Tobacco Use  . Smoking status: Current Every Day Smoker    Packs/day: 0.50    Years: 30.00    Pack years: 15.00    Types: Cigarettes  . Smokeless tobacco: Never Used  . Tobacco comment: "Trying - hard to stop"  Substance Use Topics  .  Alcohol use: No    Comment: " about 40 ounce beer per month."  down to a beer every 3 weeks  . Drug use: No    Types: Marijuana, Cocaine    Comment: marijuana "laced with something"; today, stated "no" to question of illegal drug use 06/16/2016     Allergies   Patient has no known allergies.   Review of Systems Review of Systems  All other systems reviewed and are negative.    Physical Exam Updated Vital Signs BP (!) 154/99 (BP Location: Right Arm)   Pulse 78   Temp 99 F (37.2 C) (Oral)   Resp 20   SpO2 100%   Physical Exam  Constitutional: He is oriented to person, place, and time. He appears well-developed and well-nourished.  HENT:  Head: Normocephalic and atraumatic.  Right Ear: External ear normal.  Left Ear: External ear normal.  Nose: Nose normal.  Mouth/Throat: Uvula is midline, oropharynx is clear and moist and mucous membranes are normal. No tonsillar exudate.  Eyes: Pupils are equal, round, and reactive to light. Right eye exhibits no discharge. Left eye exhibits no discharge. No scleral icterus.  PEERL. No nystagmus. Normal EOM  Neck: Trachea normal. Neck supple. No spinous process tenderness present. No neck rigidity. Normal range of motion present.  No nuchal rigidity or meningismus  Cardiovascular: Normal rate, regular rhythm and intact distal pulses.  No murmur heard. Pulses:      Radial pulses are 2+ on the right side, and 2+ on the left side.       Dorsalis pedis pulses are 2+ on the right side, and 2+ on the left side.       Posterior tibial pulses are 2+ on the right side, and 2+ on the left side.  No lower extremity swelling or edema. Calves symmetric in size bilaterally.  Pulmonary/Chest: Effort  normal and breath sounds normal. He exhibits no tenderness.  Abdominal: Soft. Bowel sounds are normal. There is no tenderness. There is no rigidity, no rebound, no guarding and no CVA tenderness.  Musculoskeletal: He exhibits no edema.       Right hip: Normal.       Right knee: Normal.       Right ankle: Normal. Achilles tendon normal.       Lumbar back: Normal.       Right upper leg: Normal.       Right foot: Normal.  Lymphadenopathy:    He has no cervical adenopathy.  Neurological: He is alert and oriented to person, place, and time. He is not disoriented. GCS eye subscore is 4. GCS verbal subscore is 5. GCS motor subscore is 6.  Mental Status:  Alert, oriented, thought content appropriate, able to give a coherent history. Speech fluent without evidence of aphasia. Able to follow 2 step commands without difficulty.  Cranial Nerves:  II:  Peripheral visual fields grossly normal, pupils equal, round, reactive to light III,IV, VI: ptosis not present, extra-ocular motions intact bilaterally  V,VII: smile symmetric, eyebrows raise symmetric, facial light touch sensation equal VIII: hearing grossly normal to voice  X: uvula elevates symmetrically  XI: bilateral shoulder shrug symmetric and strong XII: midline tongue extension without fassiculations Motor:  Normal tone. 5/5 in upper and lower extremities bilaterally including strong and equal grip strength and dorsiflexion/plantar flexion Sensory: Sensation intact to light touch in all extremities. Negative Romberg.  Deep Tendon Reflexes: 2+ and symmetric in the biceps and patella Cerebellar: normal finger-to-nose with bilateral upper extremities. Normal  heel-to -shin balance bilaterally of the lower extremity. No pronator drift.  Gait: normal gait and balance CV: distal pulses palpable throughout   Skin: Skin is warm and dry. Capillary refill takes less than 2 seconds. No rash noted. He is not diaphoretic.  Psychiatric: He has a normal mood  and affect.  Nursing note and vitals reviewed.    ED Treatments / Results  Labs (all labs ordered are listed, but only abnormal results are displayed) Labs Reviewed  CBG MONITORING, ED - Abnormal; Notable for the following components:      Result Value   Glucose-Capillary 141 (*)    All other components within normal limits  BASIC METABOLIC PANEL  CBC WITH DIFFERENTIAL/PLATELET   Results for orders placed or performed during the hospital encounter of 06/28/18  Comprehensive metabolic panel  Result Value Ref Range   Sodium 140 135 - 145 mmol/L   Potassium 4.3 3.5 - 5.1 mmol/L   Chloride 98 98 - 111 mmol/L   CO2 29 22 - 32 mmol/L   Glucose, Bld 358 (H) 70 - 99 mg/dL   BUN 74 (H) 6 - 20 mg/dL   Creatinine, Ser 6.91 (H) 0.61 - 1.24 mg/dL   Calcium 9.9 8.9 - 10.3 mg/dL   Total Protein 6.3 (L) 6.5 - 8.1 g/dL   Albumin 3.3 (L) 3.5 - 5.0 g/dL   AST 15 15 - 41 U/L   ALT 16 0 - 44 U/L   Alkaline Phosphatase 58 38 - 126 U/L   Total Bilirubin 0.6 0.3 - 1.2 mg/dL   GFR calc non Af Amer 8 (L) >60 mL/min   GFR calc Af Amer 9 (L) >60 mL/min   Anion gap 13 5 - 15  Ethanol  Result Value Ref Range   Alcohol, Ethyl (B) <10 <10 mg/dL  cbc  Result Value Ref Range   WBC 7.8 4.0 - 10.5 K/uL   RBC 4.48 4.22 - 5.81 MIL/uL   Hemoglobin 13.3 13.0 - 17.0 g/dL   HCT 42.7 39.0 - 52.0 %   MCV 95.3 80.0 - 100.0 fL   MCH 29.7 26.0 - 34.0 pg   MCHC 31.1 30.0 - 36.0 g/dL   RDW 12.2 11.5 - 15.5 %   Platelets 153 150 - 400 K/uL   nRBC 0.0 0.0 - 0.2 %  Rapid urine drug screen (hospital performed)  Result Value Ref Range   Opiates NONE DETECTED NONE DETECTED   Cocaine NONE DETECTED NONE DETECTED   Benzodiazepines NONE DETECTED NONE DETECTED   Amphetamines NONE DETECTED NONE DETECTED   Tetrahydrocannabinol NONE DETECTED NONE DETECTED   Barbiturates NONE DETECTED NONE DETECTED  CBG monitoring, ED  Result Value Ref Range   Glucose-Capillary 331 (H) 70 - 99 mg/dL   Ct Head Wo Contrast  Result  Date: 06/29/2018 CLINICAL DATA:  Hallucinations. Bitten by a brown recluse spider in the past with similar symptoms. EXAM: CT HEAD WITHOUT CONTRAST TECHNIQUE: Contiguous axial images were obtained from the base of the skull through the vertex without intravenous contrast. COMPARISON:  06/06/2015. FINDINGS: Brain: Mild diffuse cerebral and cerebellar atrophy with mild progression. Minimal patchy white matter low density in both cerebral hemispheres without significant change. No intracranial hemorrhage, mass lesion or CT evidence of acute infarction. Vascular: No hyperdense vessel or unexpected calcification. Skull: Normal. Negative for fracture or focal lesion. Sinuses/Orbits: Moderate bilateral ethmoid and frontal sinus mucosal thickening. Minimal right sphenoid sinus mucosal thickening. Small amount of retained secretions in the sphenoid sinus on the left.  Right maxillary sinus retention cysts. Status post cataract extraction on the left. Unremarkable right orbit. Other: None. IMPRESSION: 1. No acute abnormality. 2. Mild diffuse cerebral and cerebellar atrophy with mild progression. 3. Stable minimal chronic small vessel white matter ischemic changes in both cerebral hemispheres. 4. Chronic sinusitis. Electronically Signed   By: Claudie Revering M.D.   On: 06/29/2018 13:52   Vas [place] Lower Extremity Venous (dvt) (only Mc & Wl 7a-7p)  Result Date: 06/29/2018  Lower Venous Study Indications: Pain.  Performing Technologist: Oliver Hum RVT  Examination Guidelines: A complete evaluation includes B-mode imaging, spectral Doppler, color Doppler, and power Doppler as needed of all accessible portions of each vessel. Bilateral testing is considered an integral part of a complete examination. Limited examinations for reoccurring indications may be performed as noted.  Right Venous Findings: +---------+---------------+---------+-----------+----------+-------+           CompressibilityPhasicitySpontaneityPropertiesSummary +---------+---------------+---------+-----------+----------+-------+ CFV      Full           Yes      Yes                          +---------+---------------+---------+-----------+----------+-------+ SFJ      Full                                                 +---------+---------------+---------+-----------+----------+-------+ FV Prox  Full                                                 +---------+---------------+---------+-----------+----------+-------+ FV Mid   Full                                                 +---------+---------------+---------+-----------+----------+-------+ FV DistalFull                                                 +---------+---------------+---------+-----------+----------+-------+ PFV      Full                                                 +---------+---------------+---------+-----------+----------+-------+ POP      Full           Yes      Yes                          +---------+---------------+---------+-----------+----------+-------+ PTV      Full                                                 +---------+---------------+---------+-----------+----------+-------+ PERO     Full                                                 +---------+---------------+---------+-----------+----------+-------+  Left Venous Findings: +---+---------------+---------+-----------+----------+-------+    CompressibilityPhasicitySpontaneityPropertiesSummary +---+---------------+---------+-----------+----------+-------+ CFVFull           Yes      Yes                          +---+---------------+---------+-----------+----------+-------+    Summary: Right: There is no evidence of deep vein thrombosis in the lower extremity. No cystic structure found in the popliteal fossa. Left: No evidence of common femoral vein obstruction.  *See table(s) above for measurements and  observations. Electronically signed by Curt Jews MD on 06/29/2018 at 3:32:45 PM.    Final     EKG None  Radiology Ct Head Wo Contrast  Result Date: 06/29/2018 CLINICAL DATA:  Hallucinations. Bitten by a brown recluse spider in the past with similar symptoms. EXAM: CT HEAD WITHOUT CONTRAST TECHNIQUE: Contiguous axial images were obtained from the base of the skull through the vertex without intravenous contrast. COMPARISON:  06/06/2015. FINDINGS: Brain: Mild diffuse cerebral and cerebellar atrophy with mild progression. Minimal patchy white matter low density in both cerebral hemispheres without significant change. No intracranial hemorrhage, mass lesion or CT evidence of acute infarction. Vascular: No hyperdense vessel or unexpected calcification. Skull: Normal. Negative for fracture or focal lesion. Sinuses/Orbits: Moderate bilateral ethmoid and frontal sinus mucosal thickening. Minimal right sphenoid sinus mucosal thickening. Small amount of retained secretions in the sphenoid sinus on the left. Right maxillary sinus retention cysts. Status post cataract extraction on the left. Unremarkable right orbit. Other: None. IMPRESSION: 1. No acute abnormality. 2. Mild diffuse cerebral and cerebellar atrophy with mild progression. 3. Stable minimal chronic small vessel white matter ischemic changes in both cerebral hemispheres. 4. Chronic sinusitis. Electronically Signed   By: Claudie Revering M.D.   On: 06/29/2018 13:52   Vas [place] Lower Extremity Venous (dvt) (only Mc & Wl 7a-7p)  Result Date: 06/29/2018  Lower Venous Study Indications: Pain.  Performing Technologist: Oliver Hum RVT  Examination Guidelines: A complete evaluation includes B-mode imaging, spectral Doppler, color Doppler, and power Doppler as needed of all accessible portions of each vessel. Bilateral testing is considered an integral part of a complete examination. Limited examinations for reoccurring indications may be performed as noted.   Right Venous Findings: +---------+---------------+---------+-----------+----------+-------+          CompressibilityPhasicitySpontaneityPropertiesSummary +---------+---------------+---------+-----------+----------+-------+ CFV      Full           Yes      Yes                          +---------+---------------+---------+-----------+----------+-------+ SFJ      Full                                                 +---------+---------------+---------+-----------+----------+-------+ FV Prox  Full                                                 +---------+---------------+---------+-----------+----------+-------+ FV Mid   Full                                                 +---------+---------------+---------+-----------+----------+-------+  FV DistalFull                                                 +---------+---------------+---------+-----------+----------+-------+ PFV      Full                                                 +---------+---------------+---------+-----------+----------+-------+ POP      Full           Yes      Yes                          +---------+---------------+---------+-----------+----------+-------+ PTV      Full                                                 +---------+---------------+---------+-----------+----------+-------+ PERO     Full                                                 +---------+---------------+---------+-----------+----------+-------+  Left Venous Findings: +---+---------------+---------+-----------+----------+-------+    CompressibilityPhasicitySpontaneityPropertiesSummary +---+---------------+---------+-----------+----------+-------+ CFVFull           Yes      Yes                          +---+---------------+---------+-----------+----------+-------+    Summary: Right: There is no evidence of deep vein thrombosis in the lower extremity. No cystic structure found in the popliteal fossa.  Left: No evidence of common femoral vein obstruction.  *See table(s) above for measurements and observations. Electronically signed by Curt Jews MD on 06/29/2018 at 3:32:45 PM.    Final     Procedures Procedures (including critical care time)  Medications Ordered in ED Medications - No data to display   Initial Impression / Assessment and Plan / ED Course  I have reviewed the triage vital signs and the nursing notes.  Pertinent labs & imaging results that were available during my care of the patient were reviewed by me and considered in my medical decision making (see chart for details).     52 y.o. male with an extensive past medical history as below including hypertension, hyperlipidemia, insulin-dependent type 2 diabetes, diabetic neuropathy, prior substance abuse, CKD who presents emergency department today for multiple complaints.   Patient reported to the emergency department yesterday but left prior to being seen.  Labs were drawn and resulted with an elevated blood sugar (358) as well as creatinine (6.91).  Patient was called as a result of these and instructed to come back to the emergency department.  Patient reports that he has known stage V kidney disease and is followed by Dr.Rieman Justin Mend of [place] [facility].  He reports that he had a fistula placed in the left upper extremity approximately 1 year ago.  He is holding off on dialysis at this time until he is recommended for it.  He reports his last visit was  earlier this month.  He is currently being seen over at [facility] for possible renal transplant and there is documentation for this. His most recent Cr prior to yesterday was at [facility] on 9/23 and was 7.35. Patient reports he is also a insulin-dependent diabetic.  His blood sugars yesterday were in the 300s and he was not in DKA.  He reports he did not take his insulin that morning.  When he arrived home he did take them.  He denies any current nausea/vomiting or abdominal pain. Lab  work without evidence of DKA. No further workup indicated for elevated Cr as this is known and he is followed with nephrology.   Patient did present yesterday for multiple different reasons.  He reports that on Thursday 10/24 and Saturday, 10/26 he woke out of his bed and was not sure where he was.  He reports that he had difficulty slurred speech but was understandable.  He notes that he was paralyzed from the neck down.  There is a telephone encounter on 10/28 with the same statements where he quoted "I cannot move, I don't know where I am at, I am paralyzed."  Patient reports no recent encounters of this.  He states this feels like when he was bitten by a brown recluse 1 year ago.  He reports his episodes last for about 2 hours before being completely resolved.  He notes he did not want to come to the hospital after these episodes as it was homecoming night and thought it would be busy.  He also reports he wants a scan of his head as he has been having headaches over the last 1 month.  He reports they have been every second or third day and are generalized lasting approximately 30 minutes and relieved without intervention.  Patient denies current headache.  Headaches are not associated with any neurologic symptoms.  The patient also notes intermittent pain of his right lower leg.  Thank you pain is located on the posterior hamstring and occurs while lying down.  Pain does not occur with activity.  He denies any hip, knee, ankle pain.  No lower extremity swelling.  No skin changes.  He denies injury.  No new numbness/tingling/weakness.  He denies any back pain associate with this. Patients symptoms are atypical. Do not suspect CVA or TIA. The patient is currently asymptomatic.  He has a normal neurologic exam.  CT head is unremarkable.  He is without current headache. Non concerning for [facility], ICH, Meningitis, or temporal arteritis.  Vital signs are reassuring. No evidence septic joint. [place] negative for DVT. No  falls or trauma. No indicated for imaging. Normal ROM and able to walk.  Lab work reviewed and reassuring.  No further work-up indicated.  Patient denies any SI/HI to myself.  He was cleared from psych standpoint.  The evaluation does not show pathology that would require ongoing emergent intervention or inpatient treatment. I advised the patient to follow-up with pcp this week. I advised the patient to return to the emergency department with new or worsening symptoms or new concerns. Specific return precautions discussed. The patient verbalized understanding and agreement with plan. All questions answered. No further questions at this time. The patient is hemodynamically stable, mentating appropriately and appears safe for discharge. Patient said he was pleased with workup and feels comfortable going home.   Final Clinical Impressions(s) / ED Diagnoses   Final diagnoses:  Right leg pain  Intermittent headache    ED Discharge Orders  None       Jillyn Ledger, PA-C 06/30/18 1543    Dorie Rank, MD 07/01/18 [phone]

## 2018-06-29 NOTE — Discharge Instructions (Signed)
Please read and follow all provided instructions.  Your diagnoses today include:  1. Right leg pain   2. Intermittent headache     Tests performed today include: Vital signs. See below for your results today.  CT of your head Blood work - this showed elevated creatinine, consistent with your kidney disease. Please keep all appointments with your kidney doctor and ensure close follow up Ultrasound of your lower extremity. This did not show signs of a blod clot.   Follow-up instructions: Please follow-up with your primary care provider for further evaluation of symptoms and treatment. Call tomorrow to schedule an appointment.   Return instructions:   Please return to the Emergency Department if you do not get better, if you get worse, or new symptoms   - Fever (temperature greater than 101.22F)  - Bleeding that does not stop with holding pressure to the area    -Severe pain (please note that you may be more sore the day after your accident)  - Chest Pain  - Difficulty breathing  - Severe nausea or vomiting  - Inability to tolerate food and liquids  - Passing out  - Skin becoming red around your wounds  - Change in mental status (confusion or lethargy)  - New numbness or weakness     Please return if you have any other emergent concerns.  Additional Information:  Your vital signs today were: BP (!) 177/94    Pulse 62    Temp 99 F (37.2 C) (Oral)    Resp 16    SpO2 96%  If your blood pressure (BP) was elevated above 135/85 this visit, please have this repeated by your doctor within one month.

## 2018-06-29 NOTE — Progress Notes (Signed)
Right lower extremity venous duplex has been completed. Negative for DVT. Results were given to the patient's nurse, Elmyra Ricks.  06/29/18 2:09 PM Jerry Cantrell RVT

## 2018-06-29 NOTE — BH Assessment (Addendum)
Tele Assessment Note   Patient Name: Jerry Cantrell MRN: [id number] Referring Physician: Tomi Bamberger Location of Patient: [facility] ED Location of Provider: [place] Department  Jerry Cantrell is an 52 y.o. male. The pt originally stated he was having hallucinations and was paralyzed.  When asked to explain his hallucinations, the pt denied seeing or hearing things, ut of being confused in the morning when he is about to go to sleep.  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  The pt has little mental health history.  According to previous records the pt was having suicidal thoughts in 2017 due to deteriorating health conditions.  The pt had a suicide attempt in 1996 and went to a hospital in [place].  The pt denies SI currently.    The pt lives with his longer term gf of over 20 years.  He is currently disabled.  He denies self harm, HI, legal issues, history of abuse and hallucinations.  The pt sleeps during the day, but appears to be getting enough sleep.  He has a good appetite.  The pt denies any current SA.  Pt is dressed in a hospital gown. He is alert and oriented x4. Pt speaks in a clear tone, at moderate volume and normal pace. Eye contact is unable to be assessed due to the pt being legally blind. Pt's mood is ipleasant. Thought process is coherent and relevant. There is no indication Pt is currently responding to internal stimuli or experiencing delusional thought content.?Pt was cooperative throughout assessment.    Diagnosis:   Past Medical History:  Past Medical History:  Diagnosis Date  . Acute diastolic heart failure ([place])   . CHF (congestive heart failure) ([place])   . Diabetes mellitus with nephropathy ([place]) 05/12/2012   Dr. Justin Mend follows  . Hemorrhoids 05/12/2012  . Hyperlipidemia   . Hypertension   . Lumbar spinal stenosis 05/03/2012   Mild with only right L4 nerve root encroachment, no neurogenic claudication   . Lumbar spondylosis  05/03/2012  . Meralgia paraesthetica 05/03/2012  . Meralgia paraesthetica 05/03/2012   On Lyrica which does improve pain.   Marland Kitchen Neuropathy in diabetes ([place]) 05/12/2012  . Obesity   . Pneumonia   . Retinopathy   . Sleep apnea    uses cpap  . Substance abuse ([place])   . Urinary incontinence 05/12/2012   Since the start of Sept. 2013     Past Surgical History:  Procedure Laterality Date  . ABDOMINAL SURGERY     Abscess I&D 2/2 infected hair  . AV FISTULA PLACEMENT Left 02/06/2016   Procedure: LEFT ARM RADIOCEPHALIC ARTERIOVENOUS (AV) FISTULA CREATION;  Surgeon: Rosetta Posner, MD;  Location: [facility];  Service: Vascular;  Laterality: Left;  . COLONOSCOPY W/ POLYPECTOMY     pt to bring records  . COLONOSCOPY WITH PROPOFOL N/A 06/05/2016   Procedure: COLONOSCOPY WITH PROPOFOL;  Surgeon: Doran Stabler, MD;  Location: [facility];  Service: Gastroenterology;  Laterality: N/A;  . TONSILLECTOMY      Family History:  Family History  Problem Relation Age of Onset  . Prostate cancer Father   . Alcohol abuse Father   . Emphysema Father        smoked  . Colon cancer Neg Hx     Social History:  reports that he has been smoking cigarettes. He has a 15.00 pack-year smoking history. He has never used smokeless tobacco. He reports that  he does not drink alcohol or use drugs.  Additional Social History:  Alcohol / Drug Use Pain Medications: See MAR Prescriptions: See MAR Over the Counter: See MAR History of alcohol / drug use?: No history of alcohol / drug abuse Longest period of sobriety (when/how long): NA  CIWA: CIWA-Ar BP: (!) 179/101 Pulse Rate: (!) 58 COWS:    Allergies: No Known Allergies  Home Medications:  (Not in a hospital admission)  OB/GYN Status:  No LMP for male patient.  General Assessment Data Location of Assessment: [facility] ED TTS Assessment: In system Is this a Tele or Face-to-Face Assessment?: Face-to-Face Is this an Initial Assessment or a Re-assessment for this  encounter?: Initial Assessment Patient Accompanied by:: N/A Language Other than English: No Living Arrangements: Other (Comment)(home) What gender do you identify as?: Male Marital status: Married [place] name: NA Pregnancy Status: No Living Arrangements: Spouse/significant other Can pt return to current living arrangement?: Yes Admission Status: Voluntary Is patient capable of signing voluntary admission?: Yes Referral Source: Self/Family/Friend Insurance type: Medicare     Crisis Care Plan Living Arrangements: Spouse/significant other Legal Guardian: Other:(Self) Name of Psychiatrist: none Name of Therapist: none  Education Status Is patient currently in school?: No Is the patient employed, unemployed or receiving disability?: Receiving disability income  Risk to self with the past 6 months Suicidal Ideation: No Has patient been a risk to self within the past 6 months prior to admission? : No Suicidal Intent: No Has patient had any suicidal intent within the past 6 months prior to admission? : No Is patient at risk for suicide?: No Suicidal Plan?: No Has patient had any suicidal plan within the past 6 months prior to admission? : No Access to Means: No What has been your use of drugs/alcohol within the last 12 months?: none Previous Attempts/Gestures: Yes How many times?: 1 Other Self Harm Risks: none Triggers for Past Attempts: None known Intentional Self Injurious Behavior: None Family Suicide History: No Recent stressful life event(s): Recent negative physical changes Persecutory voices/beliefs?: No Depression: No Substance abuse history and/or treatment for substance abuse?: Yes Suicide prevention information given to non-admitted patients: Yes  Risk to Others within the past 6 months Homicidal Ideation: No Does patient have any lifetime risk of violence toward others beyond the six months prior to admission? : No Thoughts of Harm to Others: No Current  Homicidal Intent: No Current Homicidal Plan: No Access to Homicidal Means: No Identified Victim: none History of harm to others?: No Assessment of Violence: None Noted Violent Behavior Description: none Does patient have access to weapons?: No Criminal Charges Pending?: No Does patient have a court date: No Is patient on probation?: No  Psychosis Hallucinations: None noted Delusions: None noted  Mental Status Report Appearance/Hygiene: Unremarkable, In hospital gown Eye Contact: Unable to Assess Motor Activity: Unable to assess Speech: Logical/coherent Level of Consciousness: Alert Mood: Pleasant Affect: Appropriate to circumstance Anxiety Level: None Thought Processes: Coherent, Relevant Judgement: Unimpaired Orientation: Person, Place, Time, Situation Obsessive Compulsive Thoughts/Behaviors: None  Cognitive Functioning Concentration: Normal Memory: Recent Intact, Remote Intact Is patient IDD: No Insight: Fair Impulse Control: Fair Appetite: Good Have you had any weight changes? : No Change Sleep: Decreased Total Hours of Sleep: 6 Vegetative Symptoms: None  ADLScreening [facility] Assessment Services) Patient's cognitive ability adequate to safely complete daily activities?: Yes Patient able to express need for assistance with ADLs?: Yes Independently performs ADLs?: Yes (appropriate for developmental age)  Prior Inpatient Therapy Prior Inpatient Therapy: No  Prior  Outpatient Therapy Prior Outpatient Therapy: No Does patient have an ACCT team?: No Does patient have Intensive In-House Services?  : No Does patient have [facility] services? : No Does patient have P4CC services?: No  ADL Screening (condition at time of admission) Patient's cognitive ability adequate to safely complete daily activities?: Yes Patient able to express need for assistance with ADLs?: Yes Independently performs ADLs?: Yes (appropriate for developmental age)       Abuse/Neglect  Assessment (Assessment to be complete while patient is alone) Abuse/Neglect Assessment Can Be Completed: Yes Physical Abuse: Denies Verbal Abuse: Denies Sexual Abuse: Denies Exploitation of patient/patient's resources: Denies Self-Neglect: Denies Values / Beliefs Cultural Requests During Hospitalization: None Spiritual Requests During Hospitalization: None Consults Spiritual Care Consult Needed: No Social Work Consult Needed: No Regulatory affairs officer (For Healthcare) Does Patient Have a Medical Advance Directive?: No          Disposition:  Disposition Initial Assessment Completed for this Encounter: Yes Disposition of Patient: Discharge Patient refused recommended treatment: No   NP Shuvon Rankin recommends the pt be psych cleared.  RN Elmyra Ricks was made aware of the recommendation.  This service was provided via telemedicine using a 2-way, interactive audio and video technology.  Names of all persons participating in this telemedicine service and their role in this encounter. Name: Jerry Cantrell Role: Pt  Name: Virgina Organ Role: TTS  Name:  Role:   Name:  Role:     Enzo Montgomery 06/29/2018 3:06 PM

## 2018-07-04 DIAGNOSIS — E113512 Type 2 diabetes mellitus with proliferative diabetic retinopathy with macular edema, left eye: Secondary | ICD-10-CM | POA: Diagnosis not present

## 2018-07-05 DIAGNOSIS — L723 Sebaceous cyst: Secondary | ICD-10-CM | POA: Diagnosis not present

## 2018-07-19 ENCOUNTER — Other Ambulatory Visit: Payer: Self-pay | Admitting: Family Medicine

## 2018-07-19 DIAGNOSIS — R059 Cough, unspecified: Secondary | ICD-10-CM

## 2018-07-19 DIAGNOSIS — R05 Cough: Secondary | ICD-10-CM

## 2018-07-19 DIAGNOSIS — H60541 Acute eczematoid otitis externa, right ear: Secondary | ICD-10-CM

## 2018-07-20 NOTE — Telephone Encounter (Signed)
Requested Prescriptions  Pending Prescriptions Disp Refills  . fluticasone (FLONASE) 50 MCG/ACT nasal spray [Pharmacy Med Name: FLUTICASONE  50MCG/ACT NASAL SPRAY] 16 g 0    Sig: USE TWO SPRAYS INTO EACH NOSTRILS DAILY     Ear, Nose, and Throat: Nasal Preparations - Corticosteroids Passed - 07/19/2018 11:35 AM      Passed - Valid encounter within last 12 months    Recent Outpatient Visits          5 months ago Type 2 diabetes mellitus with complication, without long-term current use of insulin ([place])   Primary Care at [place], MD   8 months ago Insulin dependent type 2 diabetes mellitus, uncontrolled ([place])   Primary Care at [facility], Arlie Solomons, MD   1 year ago Chronic kidney disease, stage IV (severe) ([place])   Primary Care at [facility], Arlie Solomons, MD   1 year ago Mold exposure   Primary Care at [facility], Arlie Solomons, MD   1 year ago Wellness examination   Primary Care at [place], MD      Future Appointments            In 4 weeks Forrest Moron, MD Primary Care at [place], [place]   In 5 months Parrett, Fonnie Mu, NP [facility]         . clobetasol cream (TEMOVATE) 0.05 % [Pharmacy Med Name: CLOBETASOL PROPIONATE 0.05% CREAM] 45 g 0    Sig: APPLY TO AFFECED ARES(S) TOPICALLY TWICE DAILY     Dermatology:  Corticosteroids Passed - 07/19/2018 11:35 AM      Passed - Valid encounter within last 12 months    Recent Outpatient Visits          5 months ago Type 2 diabetes mellitus with complication, without long-term current use of insulin ([place])   Primary Care at [place], MD   8 months ago Insulin dependent type 2 diabetes mellitus, uncontrolled ([place])   Primary Care at [facility], Arlie Solomons, MD   1 year ago Chronic kidney disease, stage IV (severe) ([place])   Primary Care at [facility], Arlie Solomons, MD   1 year ago Mold exposure   Primary Care at [facility], Arlie Solomons, MD   1 year ago Wellness examination   Primary  Care at [place], MD      Future Appointments            In 4 weeks Forrest Moron, MD Primary Care at [place], [facility]   In 5 months Parrett, Fonnie Mu, NP [facility]

## 2018-07-21 ENCOUNTER — Other Ambulatory Visit: Payer: Self-pay | Admitting: Family Medicine

## 2018-08-01 ENCOUNTER — Telehealth: Payer: Self-pay | Admitting: Family Medicine

## 2018-08-01 ENCOUNTER — Telehealth: Payer: Self-pay

## 2018-08-01 NOTE — Telephone Encounter (Signed)
Patient is wanting a form that was faxed to [place] a few weeks ago filled out in regards to receiving therapeutic shoes.

## 2018-08-01 NOTE — Telephone Encounter (Signed)
Fax received today for certifying physician statement for therapeutic shoes Form in Dr. Nolon Rod box up front.

## 2018-08-02 DIAGNOSIS — N2581 Secondary hyperparathyroidism of renal origin: Secondary | ICD-10-CM | POA: Diagnosis not present

## 2018-08-02 DIAGNOSIS — N185 Chronic kidney disease, stage 5: Secondary | ICD-10-CM | POA: Diagnosis not present

## 2018-08-02 DIAGNOSIS — D631 Anemia in chronic kidney disease: Secondary | ICD-10-CM | POA: Diagnosis not present

## 2018-08-02 DIAGNOSIS — E1122 Type 2 diabetes mellitus with diabetic chronic kidney disease: Secondary | ICD-10-CM | POA: Diagnosis not present

## 2018-08-02 DIAGNOSIS — I120 Hypertensive chronic kidney disease with stage 5 chronic kidney disease or end stage renal disease: Secondary | ICD-10-CM | POA: Diagnosis not present

## 2018-08-05 NOTE — Telephone Encounter (Signed)
Form signed by stallings and faxed back Dgaddy, CMA

## 2018-08-09 ENCOUNTER — Other Ambulatory Visit: Payer: Self-pay | Admitting: Family Medicine

## 2018-08-09 DIAGNOSIS — E1165 Type 2 diabetes mellitus with hyperglycemia: Secondary | ICD-10-CM

## 2018-08-09 DIAGNOSIS — IMO0002 Reserved for concepts with insufficient information to code with codable children: Secondary | ICD-10-CM

## 2018-08-09 DIAGNOSIS — Z794 Long term (current) use of insulin: Principal | ICD-10-CM

## 2018-08-17 ENCOUNTER — Ambulatory Visit: Payer: Medicare Other | Admitting: Family Medicine

## 2018-08-17 DIAGNOSIS — Z7689 Persons encountering health services in other specified circumstances: Secondary | ICD-10-CM | POA: Diagnosis not present

## 2018-08-17 DIAGNOSIS — Z01818 Encounter for other preprocedural examination: Secondary | ICD-10-CM | POA: Diagnosis not present

## 2018-08-18 ENCOUNTER — Ambulatory Visit (INDEPENDENT_AMBULATORY_CARE_PROVIDER_SITE_OTHER): Payer: Medicare Other | Admitting: Family Medicine

## 2018-08-18 ENCOUNTER — Encounter: Payer: Self-pay | Admitting: Family Medicine

## 2018-08-18 ENCOUNTER — Other Ambulatory Visit: Payer: Self-pay

## 2018-08-18 VITALS — BP 160/80 | HR 74 | Temp 98.5°F | Resp 17 | Ht 70.0 in | Wt 274.8 lb

## 2018-08-18 DIAGNOSIS — F5101 Primary insomnia: Secondary | ICD-10-CM | POA: Diagnosis not present

## 2018-08-18 DIAGNOSIS — E213 Hyperparathyroidism, unspecified: Secondary | ICD-10-CM | POA: Insufficient documentation

## 2018-08-18 DIAGNOSIS — G894 Chronic pain syndrome: Secondary | ICD-10-CM

## 2018-08-18 DIAGNOSIS — E1165 Type 2 diabetes mellitus with hyperglycemia: Secondary | ICD-10-CM

## 2018-08-18 DIAGNOSIS — E1122 Type 2 diabetes mellitus with diabetic chronic kidney disease: Secondary | ICD-10-CM

## 2018-08-18 DIAGNOSIS — I10 Essential (primary) hypertension: Secondary | ICD-10-CM

## 2018-08-18 DIAGNOSIS — R05 Cough: Secondary | ICD-10-CM

## 2018-08-18 DIAGNOSIS — L409 Psoriasis, unspecified: Secondary | ICD-10-CM

## 2018-08-18 DIAGNOSIS — E1142 Type 2 diabetes mellitus with diabetic polyneuropathy: Secondary | ICD-10-CM

## 2018-08-18 DIAGNOSIS — H60541 Acute eczematoid otitis externa, right ear: Secondary | ICD-10-CM

## 2018-08-18 DIAGNOSIS — R059 Cough, unspecified: Secondary | ICD-10-CM

## 2018-08-18 DIAGNOSIS — E113593 Type 2 diabetes mellitus with proliferative diabetic retinopathy without macular edema, bilateral: Secondary | ICD-10-CM

## 2018-08-18 DIAGNOSIS — N185 Chronic kidney disease, stage 5: Secondary | ICD-10-CM

## 2018-08-18 DIAGNOSIS — IMO0002 Reserved for concepts with insufficient information to code with codable children: Secondary | ICD-10-CM

## 2018-08-18 DIAGNOSIS — Z794 Long term (current) use of insulin: Secondary | ICD-10-CM

## 2018-08-18 LAB — POCT GLYCOSYLATED HEMOGLOBIN (HGB A1C): HEMOGLOBIN A1C: 5.9 % — AB (ref 4.0–5.6)

## 2018-08-18 MED ORDER — CARVEDILOL 25 MG PO TABS: 25.0000 mg | ORAL_TABLET | Freq: Two times a day (BID) | ORAL | 6 refills | Status: DC

## 2018-08-18 MED ORDER — VERAPAMIL HCL ER 180 MG PO CP24: 180.0000 mg | ORAL_CAPSULE | Freq: Every day | ORAL | 3 refills | Status: DC

## 2018-08-18 MED ORDER — ATORVASTATIN CALCIUM 80 MG PO TABS: 80.0000 mg | ORAL_TABLET | Freq: Every day | ORAL | 3 refills | Status: DC

## 2018-08-18 MED ORDER — CETIRIZINE HCL 10 MG PO TABS: ORAL_TABLET | ORAL | 3 refills | Status: DC

## 2018-08-18 MED ORDER — CLOBETASOL PROPIONATE 0.05 % EX CREA: TOPICAL_CREAM | CUTANEOUS | 6 refills | Status: DC

## 2018-08-18 MED ORDER — INSULIN GLARGINE 100 UNIT/ML SOLOSTAR PEN: PEN_INJECTOR | SUBCUTANEOUS | 0 refills | Status: DC

## 2018-08-18 MED ORDER — FLUTICASONE PROPIONATE 50 MCG/ACT NA SUSP: NASAL | 6 refills | Status: DC

## 2018-08-18 MED ORDER — FERROUS SULFATE 325 (65 FE) MG PO TABS: 325.0000 mg | ORAL_TABLET | Freq: Every day | ORAL | 1 refills | Status: DC

## 2018-08-18 MED ORDER — MELATONIN 5 MG PO TABS: 5.0000 mg | ORAL_TABLET | Freq: Every day | ORAL | 11 refills | Status: DC

## 2018-08-18 NOTE — Assessment & Plan Note (Signed)
BP uncontrolled

## 2018-08-18 NOTE — Assessment & Plan Note (Addendum)
Discussed his insulin use, he is compliant with his regimen as instructed by nephrology. Will refer to endocrinology since he is quite complicated. Continue monitoring. a1c  Due to his hypoglycemia lantus in the morning was decreased to 30 units in the morning and 15 units at bedtime

## 2018-08-18 NOTE — Progress Notes (Signed)
Established Patient Office Visit  Subjective:  Patient ID: Jerry Cantrell, male    DOB: Aug 24, 1966  Age: 52 y.o. MRN: [id number]  CC:  Chief Complaint  Patient presents with  . chronic medical conditions    pt requesting pain medication for pain and ambien for sleep as he hasn't been able to rest at night. Pt requesting a stool softner also  . Medication Refill    lipitor, coreg, zyrtec, temovate, ferrous sultate, flonase, lasix, apresoline, lantus, humalog, accu check softclix lancets, potassium chloride,and verapamil    HPI Jerry Cantrell presents for  Diabetes with CKD He reports that his diabetes meds needs refills because he left some in [place]. His morning glucose this morning was around 65 He states that a few times he went into diabetic hypoglycemia and could not get in the bed He eats one meal a day He cannot eat a lot of potassium rich foods because of his kidneys so he does not understand what to eat. He is doing 45 units of lantus and 25-35 units of the lantus at bedtime During the day he will do a sliding scale based on what he is eating.  Patient reports that he has been on gabapentin for neuropathy but his kidney doctor decreased his gabapentin to once a day which is not touching his pain.  He reports that he has been in pain and cannot sleep very well Lab Results  Component Value Date   HGBA1C 5.9 (A) 08/18/2018    Hypertension and Heart Disease He took his bp medications this morning He denies chest pains He reports that he only eats one meal a day  He tries to pay attention to his salt He will be going to see his Cardiologist soon for his check up.  BP Readings from Last 3 Encounters:  08/18/18 (!) 160/80  06/29/18 (!) 177/94  06/28/18 (!) 169/101   Psoriasis He reports that his rash and psoriasis has been getting worse He needs refills of his meds for psoriasis He states that he has been using steroid creams  Past Medical History:  Diagnosis Date    . Acute diastolic heart failure ([place])   . CHF (congestive heart failure) ([place])   . Diabetes mellitus with nephropathy ([place]) 05/12/2012   Dr. Justin Mend follows  . Hemorrhoids 05/12/2012  . Hyperlipidemia   . Hypertension   . Lumbar spinal stenosis 05/03/2012   Mild with only right L4 nerve root encroachment, no neurogenic claudication   . Lumbar spondylosis 05/03/2012  . Meralgia paraesthetica 05/03/2012  . Meralgia paraesthetica 05/03/2012   On Lyrica which does improve pain.   Marland Kitchen Neuropathy in diabetes ([place]) 05/12/2012  . Obesity   . Pneumonia   . Retinopathy   . Sleep apnea    uses cpap  . Substance abuse ([place])   . Urinary incontinence 05/12/2012   Since the start of Sept. 2013     Past Surgical History:  Procedure Laterality Date  . ABDOMINAL SURGERY     Abscess I&D 2/2 infected hair  . AV FISTULA PLACEMENT Left 02/06/2016   Procedure: LEFT ARM RADIOCEPHALIC ARTERIOVENOUS (AV) FISTULA CREATION;  Surgeon: Rosetta Posner, MD;  Location: [facility];  Service: Vascular;  Laterality: Left;  . COLONOSCOPY W/ POLYPECTOMY     pt to bring records  . COLONOSCOPY WITH PROPOFOL N/A 06/05/2016   Procedure: COLONOSCOPY WITH PROPOFOL;  Surgeon: Doran Stabler, MD;  Location: [facility];  Service: Gastroenterology;  Laterality: N/A;  . TONSILLECTOMY  Family History  Problem Relation Age of Onset  . Prostate cancer Father   . Alcohol abuse Father   . Emphysema Father        smoked  . Colon cancer Neg Hx     Social History   Socioeconomic History  . Marital status: Single    Spouse name: Not on file  . Number of children: Not on file  . Years of education: Not on file  . Highest education level: Not on file  Occupational History  . Occupation: dietary services    Employer: [employer]  Social Needs  . Financial resource strain: Not on file  . Food insecurity:    Worry: Not on file    Inability: Not on file  . Transportation needs:    Medical: Not on file    Non-medical: Not on file   Tobacco Use  . Smoking status: Current Every Day Smoker    Packs/day: 0.50    Years: 30.00    Pack years: 15.00    Types: Cigarettes  . Smokeless tobacco: Never Used  . Tobacco comment: "Trying - hard to stop"  Substance and Sexual Activity  . Alcohol use: No    Comment: " about 40 ounce beer per month."  down to a beer every 3 weeks  . Drug use: No    Types: Marijuana, Cocaine    Comment: marijuana "laced with something"; today, stated "no" to question of illegal drug use 06/16/2016  . Sexual activity: Not on file  Lifestyle  . Physical activity:    Days per week: Not on file    Minutes per session: Not on file  . Stress: Not on file  Relationships  . Social connections:    Talks on phone: Not on file    Gets together: Not on file    Attends religious service: Not on file    Active member of club or organization: Not on file    Attends meetings of clubs or organizations: Not on file    Relationship status: Not on file  . Intimate partner violence:    Fear of current or ex partner: Not on file    Emotionally abused: Not on file    Physically abused: Not on file    Forced sexual activity: Not on file  Other Topics Concern  . Not on file  Social History Narrative  . Not on file    Outpatient Medications Prior to Visit  Medication Sig Dispense Refill  . ACCU-CHEK SOFTCLIX LANCETS lancets Use as directed to check blood sugar 5 times a day before meals and as needed. 400 each 2  . aspirin EC 81 MG tablet Take 1 tablet (81 mg total) by mouth daily. 90 tablet 3  . atorvastatin (LIPITOR) 80 MG tablet TAKE ONE TABLET BY MOUTH ONCE DAILY FOR CHOLESTEROL (Patient taking differently: Take 80 mg by mouth daily. ) 90 tablet 2  . carvedilol (COREG) 25 MG tablet TAKE ONE (1) TABLET BY MOUTH TWO (2) TIMES DAILY WITH FOOD (Patient taking differently: Take 25 mg by mouth 2 (two) times daily. ) 180 tablet 1  . cetirizine (ZYRTEC) 10 MG tablet TAKE ONE TABLET BY MOUTH ONCE DAILY FOR  ALLERGIES (Patient taking differently: Take 10 mg by mouth daily. ) 90 tablet 3  . clobetasol cream (TEMOVATE) 0.05 % APPLY TO AFFECED ARES(S) TOPICALLY TWICE DAILY 45 g 0  . ferrous sulfate 325 (65 FE) MG tablet Take 325 mg by mouth daily.    Marland Kitchen  fluticasone (FLONASE) 50 MCG/ACT nasal spray USE TWO SPRAYS INTO EACH NOSTRILS DAILY 16 g 0  . furosemide (LASIX) 80 MG tablet Take 160 mg by mouth 2 (two) times daily. 2 Tablets in the AM and 1 Tablet in the PM     . hydrALAZINE (APRESOLINE) 100 MG tablet TAKE ONE TABLET BY MOUTH THREE TIMES A DAY. 90 tablet 0  . insulin lispro (HUMALOG KWIKPEN) 100 UNIT/ML KiwkPen INJECT 0-60 UNITS INTO THE SKIN 3 TIMES DAILY WITH MEALS. INJECT INTO THE SKIN AS DIRECTED BASED ON SLIDING SCALE 15 mL 11  . metolazone (ZAROXOLYN) 5 MG tablet Take 1 tablet (5 mg total) by mouth daily. 90 tablet 1  . Potassium Chloride ER 20 MEQ TBCR Take 20 mEq by mouth 2 (two) times daily. 180 tablet 2  . potassium chloride SA (K-DUR,KLOR-CON) 20 MEQ tablet TAKE ONE TABLET BY MOUTH TWICE DAILY (Patient taking differently: Take 20 mEq by mouth 2 (two) times daily. ) 60 tablet 0  . verapamil (VERELAN PM) 180 MG 24 hr capsule Take 180 mg by mouth at bedtime.    . Insulin Glargine (LANTUS SOLOSTAR) 100 UNIT/ML Solostar Pen INJECT 45 UNITS UNDER THE SKIN EVERY MORNING AND INJECT 35 UNITS EVERY NIGHT 60 mL 0  . amitriptyline (ELAVIL) 50 MG tablet Take 1 tablet (50 mg total) by mouth at bedtime. (Patient not taking: Reported on 08/18/2018) 90 tablet 1  . gabapentin (NEURONTIN) 300 MG capsule Take 300 mg by mouth at bedtime.     Facility-Administered Medications Prior to Visit  Medication Dose Route Frequency Provider Last Rate Last Dose  . triamcinolone acetonide (KENALOG) 10 MG/ML injection 10 mg  10 mg Other Once Landis Martins, DPM        No Known Allergies  ROS Review of Systems  Constitutional: Positive for appetite change. Negative for activity change and chills.  Respiratory:  Negative for chest tightness, shortness of breath and wheezing.   Cardiovascular: Negative for chest pain, palpitations and leg swelling.  Musculoskeletal: Positive for arthralgias and back pain.  Neurological: Negative for dizziness, facial asymmetry and headaches.      Objective:   Vitals:   08/18/18 0840 08/18/18 0903  BP: (!) 198/96 (!) 160/80  Pulse: 74   Resp: 17   Temp: 98.5 F (36.9 C)   TempSrc: Oral   SpO2: 97%   Weight: 274 lb 12.8 oz (124.6 kg)   Height: 5\' 10"  (1.778 m)     Physical Exam  Constitutional: He is oriented to person, place, and time. He appears well-developed and well-nourished.  HENT:  Head: Normocephalic and atraumatic.  Eyes: Conjunctivae and EOM are normal.  Neck: Normal range of motion. Neck supple.  Cardiovascular: Normal rate, regular rhythm and normal heart sounds.  Pulmonary/Chest: Effort normal and breath sounds normal. No respiratory distress. He has no wheezes. He has no rales.  Musculoskeletal: Normal range of motion.        General: No edema.  Neurological: He is alert and oriented to person, place, and time. He has normal reflexes.  Psychiatric: He has a normal mood and affect. His behavior is normal. Judgment and thought content normal.      Health Maintenance Due  Topic Date Due  . OPHTHALMOLOGY EXAM  01/05/2015  . LIPID PANEL  05/10/2018  . HEMOGLOBIN A1C  05/17/2018    There are no preventive care reminders to display for this patient.  Lab Results  Component Value Date   TSH 2.57 06/16/2016   Lab Results  Component Value Date   WBC 7.2 06/29/2018   HGB 12.7 (L) 06/29/2018   HCT 41.3 06/29/2018   MCV 95.2 06/29/2018   PLT 152 06/29/2018   Lab Results  Component Value Date   NA 141 06/29/2018   K 3.5 06/29/2018   CO2 29 06/29/2018   GLUCOSE 164 (H) 06/29/2018   BUN 75 (H) 06/29/2018   CREATININE 6.63 (H) 06/29/2018   BILITOT 0.6 06/28/2018   ALKPHOS 58 06/28/2018   AST 15 06/28/2018   ALT 16 06/28/2018     PROT 6.3 (L) 06/28/2018   ALBUMIN 3.3 (L) 06/28/2018   CALCIUM 9.5 06/29/2018   ANIONGAP 12 06/29/2018   GFR 46.55 (L) 07/16/2015   Lab Results  Component Value Date   CHOL 175 05/10/2017   Lab Results  Component Value Date   HDL 35 (L) 05/10/2017   Lab Results  Component Value Date   LDLCALC 69 05/10/2017   Lab Results  Component Value Date   TRIG 353 (H) 05/10/2017   Lab Results  Component Value Date   CHOLHDL 5.0 05/10/2017   Lab Results  Component Value Date   HGBA1C 5.9 (A) 08/18/2018      Assessment & Plan:   Problem List Items Addressed This Visit      Cardiovascular and Mediastinum   Essential hypertension, malignant    BP uncontrolled         Endocrine   Insulin dependent type 2 diabetes mellitus, uncontrolled (HCC)   Relevant Medications   Insulin Glargine (LANTUS SOLOSTAR) 100 UNIT/ML Solostar Pen   Diabetic peripheral neuropathy associated with type 2 diabetes mellitus (HCC) - Primary   Relevant Medications   Insulin Glargine (LANTUS SOLOSTAR) 100 UNIT/ML Solostar Pen   Other Relevant Orders   POCT glycosylated hemoglobin (Hb A1C) (Completed)   Ambulatory referral to Endocrinology   Proliferative diabetic retinopathy ([place])    Managed by [facility] on [address] is managing his eye care. Will get records.       Relevant Medications   Insulin Glargine (LANTUS SOLOSTAR) 100 UNIT/ML Solostar Pen   CKD stage 5 due to type 2 diabetes mellitus ([place])    Discussed his insulin use, he is compliant with his regimen as instructed by nephrology. Will refer to endocrinology since he is quite complicated. Continue monitoring. a1c  Due to his hypoglycemia lantus in the morning was decreased to 30 units in the morning and 15 units at bedtime      Relevant Medications   Insulin Glargine (LANTUS SOLOSTAR) 100 UNIT/ML Solostar Pen   Other Relevant Orders   Ambulatory referral to Endocrinology     Other   Primary insomnia   Relevant Medications    Melatonin 5 MG TABS   Chronic pain syndrome    Will refer to Pain Mgmt, since he is continuing to have pain and has severe kidney disease he needs to be managed by a specialty clinic.       Relevant Orders   Ambulatory referral to Pain Clinic    Other Visit Diagnoses    Psoriasis       Relevant Orders   Ambulatory referral to Dermatology      Meds ordered this encounter  Medications  . Melatonin 5 MG TABS    Sig: Take 1 tablet (5 mg total) by mouth at bedtime.    Dispense:  30 tablet    Refill:  11  . Insulin Glargine (LANTUS SOLOSTAR) 100 UNIT/ML Solostar Pen    Sig: INJECT  30 UNITS UNDER THE SKIN EVERY MORNING AND INJECT 15 UNITS EVERY NIGHT    Dispense:  60 mL    Refill:  0    Follow-up: Return in about 4 weeks (around 09/15/2018) for please use a same day for patient to follow up on his diabetes.    Forrest Moron, MD

## 2018-08-18 NOTE — Assessment & Plan Note (Signed)
Will refer to Pain Mgmt, since he is continuing to have pain and has severe kidney disease he needs to be managed by a specialty clinic.

## 2018-08-18 NOTE — Patient Instructions (Addendum)
Decrease your Insulin Lantus to 30 units in the morning and if you only eat one meal then LANTUS 15 units of insulin in the evening  CONTINUE SLIDING SCALE INSULIN   I have referred you to Endocrinology to manage your sugars because of the kidney disease and heart disease.  You are very high risk for hypoglycemia so monitor your blood glucose  Goals Fasting: blood glucose 80-100 After meals (1-2 after eating) 100-140    If you have lab work done today you will be contacted with your lab results within the next 2 weeks.  If you have not heard from [place] then please contact us. The fastest way to get your results is to register for My Chart.   IF you received an x-ray today, you will receive an invoice from [facility]. Please contact [facility] at [phone] with questions or concerns regarding your invoice.   IF you received labwork today, you will receive an invoice from [facility]. Please contact [facility] at [phone] with questions or concerns regarding your invoice.   Our billing staff will not be able to assist you with questions regarding bills from these companies.  You will be contacted with the lab results as soon as they are available. The fastest way to get your results is to activate your My Chart account. Instructions are located on the last page of this paperwork. If you have not heard from [place] regarding the results in 2 weeks, please contact this office.

## 2018-08-18 NOTE — Assessment & Plan Note (Signed)
Managed by [facility] on [address] is managing his eye care. Will get records.

## 2018-08-22 ENCOUNTER — Ambulatory Visit (INDEPENDENT_AMBULATORY_CARE_PROVIDER_SITE_OTHER): Payer: Medicare Other | Admitting: Internal Medicine

## 2018-08-22 ENCOUNTER — Encounter: Payer: Self-pay | Admitting: Internal Medicine

## 2018-08-22 VITALS — BP 172/98 | HR 93 | Ht 70.0 in | Wt 272.4 lb

## 2018-08-22 DIAGNOSIS — E1142 Type 2 diabetes mellitus with diabetic polyneuropathy: Secondary | ICD-10-CM

## 2018-08-22 DIAGNOSIS — E1165 Type 2 diabetes mellitus with hyperglycemia: Secondary | ICD-10-CM | POA: Diagnosis not present

## 2018-08-22 DIAGNOSIS — E113593 Type 2 diabetes mellitus with proliferative diabetic retinopathy without macular edema, bilateral: Secondary | ICD-10-CM | POA: Diagnosis not present

## 2018-08-22 DIAGNOSIS — E1122 Type 2 diabetes mellitus with diabetic chronic kidney disease: Secondary | ICD-10-CM | POA: Diagnosis not present

## 2018-08-22 DIAGNOSIS — IMO0002 Reserved for concepts with insufficient information to code with codable children: Secondary | ICD-10-CM

## 2018-08-22 DIAGNOSIS — Z794 Long term (current) use of insulin: Secondary | ICD-10-CM

## 2018-08-22 DIAGNOSIS — N184 Chronic kidney disease, stage 4 (severe): Secondary | ICD-10-CM

## 2018-08-22 MED ORDER — INSULIN LISPRO (1 UNIT DIAL) 100 UNIT/ML (KWIKPEN): 10.0000 [IU] | PEN_INJECTOR | Freq: Three times a day (TID) | SUBCUTANEOUS | 6 refills | Status: DC

## 2018-08-22 MED ORDER — INSULIN PEN NEEDLE 31G X 5 MM MISC: 6 refills | Status: DC

## 2018-08-22 MED ORDER — INSULIN GLARGINE 100 UNIT/ML SOLOSTAR PEN: 30.0000 [IU] | PEN_INJECTOR | Freq: Every day | SUBCUTANEOUS | 6 refills | Status: DC

## 2018-08-22 NOTE — Progress Notes (Signed)
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

## 2018-08-22 NOTE — Progress Notes (Signed)
Name: Jerry Cantrell  MRN/ DOB: [id number], [birth year]   Age/ Sex: 52 y.o., male    PCP: Forrest Moron, MD   Reason for Endocrinology Evaluation: Type 2 Diabetes Mellitus     Date of Initial Endocrinology Visit: 08/22/2018     PATIENT IDENTIFIER: Jerry Cantrell is a 52 y.o. male with a past medical history of HTN, T2DM, dyslipidemia and CKD V, OSA on CPAP . The patient presented for initial endocrinology clinic visit on 08/22/2018 for consultative assistance with his diabetes management.    HPI: [name] was    Diagnosed with T2DM  ~ 15 yrs Prior Medications tried/Intolerance: can not recall  Currently checking blood sugars 5 x / day,  before meals and bedtime .  Hypoglycemia episodes : Yes            Symptoms: can't move               Frequency: 4/ month - recently more often  Hemoglobin A1c has ranged from 7.0 % in 01/2018 peaking at 9.1%in 2018. Patient required assistance for hypoglycemia: no  Patient has required hospitalization within the last 1 year from hyper or hypoglycemia: no  In terms of diet, the patient eats one meal a day and snacks the rest of the day, avoids sugar-sweetened beverages    Pt does not feel glucose until its in the 40's.   HOME DIABETES REGIMEN: Lantus 25 units QAM, 15 units QPM  Humalog SS   Statin: Yes ACE-I/ARB: No Prior Diabetic Education: no   METER DOWNLOAD SUMMARY: did not bring  This am was 58 mg/dL  Last night 157 mg/dL      DIABETIC COMPLICATIONS: Microvascular complications:   CKD, neuropathy , B/L retinopathy, S/P injections   Last eye exam: Completed 07/2018   Macrovascular complications:    Denies: CAD, PVD, CVA   PAST HISTORY: Past Medical History:  Past Medical History:  Diagnosis Date  . Acute diastolic heart failure ([place])   . CHF (congestive heart failure) ([place])   . Diabetes mellitus with nephropathy ([place]) 05/12/2012   Dr. Justin Mend follows  . Hemorrhoids 05/12/2012  . Hyperlipidemia   . Hypertension    . Lumbar spinal stenosis 05/03/2012   Mild with only right L4 nerve root encroachment, no neurogenic claudication   . Lumbar spondylosis 05/03/2012  . Meralgia paraesthetica 05/03/2012  . Meralgia paraesthetica 05/03/2012   On Lyrica which does improve pain.   Marland Kitchen Neuropathy in diabetes ([place]) 05/12/2012  . Obesity   . Pneumonia   . Retinopathy   . Sleep apnea    uses cpap  . Substance abuse ([place])   . Urinary incontinence 05/12/2012   Since the start of Sept. 2013     Past Surgical History:  Past Surgical History:  Procedure Laterality Date  . ABDOMINAL SURGERY     Abscess I&D 2/2 infected hair  . AV FISTULA PLACEMENT Left 02/06/2016   Procedure: LEFT ARM RADIOCEPHALIC ARTERIOVENOUS (AV) FISTULA CREATION;  Surgeon: Rosetta Posner, MD;  Location: [place];  Service: Vascular;  Laterality: Left;  . COLONOSCOPY W/ POLYPECTOMY     pt to bring records  . COLONOSCOPY WITH PROPOFOL N/A 06/05/2016   Procedure: COLONOSCOPY WITH PROPOFOL;  Surgeon: Doran Stabler, MD;  Location: [facility];  Service: Gastroenterology;  Laterality: N/A;  . TONSILLECTOMY        Social History:  reports that he has been smoking cigarettes. He has a 15.00 pack-year smoking history. He has never  used smokeless tobacco. He reports that he does not drink alcohol or use drugs. Family History:  Family History  Problem Relation Age of Onset  . Prostate cancer Father   . Alcohol abuse Father   . Emphysema Father        smoked  . Colon cancer Neg Hx       HOME MEDICATIONS: Allergies as of 08/22/2018   No Known Allergies     Medication List       Accurate as of August 22, 2018 10:26 AM. Always use your most recent med list.        ACCU-CHEK SOFTCLIX LANCETS lancets Use as directed to check blood sugar 5 times a day before meals and as needed.   aspirin EC 81 MG tablet Take 1 tablet (81 mg total) by mouth daily.   atorvastatin 80 MG tablet Commonly known as:  LIPITOR Take 1 tablet (80 mg total) by mouth  daily at 6 PM.   carvedilol 25 MG tablet Commonly known as:  COREG Take 1 tablet (25 mg total) by mouth 2 (two) times daily.   cetirizine 10 MG tablet Commonly known as:  ZYRTEC TAKE ONE TABLET BY MOUTH ONCE DAILY FOR ALLERGIES   clobetasol cream 0.05 % Commonly known as:  TEMOVATE APPLY TO AFFECED ARES(S) TOPICALLY TWICE DAILY   ferrous sulfate 325 (65 FE) MG tablet Take 1 tablet (325 mg total) by mouth daily.   fluticasone 50 MCG/ACT nasal spray Commonly known as:  FLONASE USE TWO SPRAYS INTO EACH NOSTRILS DAILY   furosemide 80 MG tablet Commonly known as:  LASIX Take 160 mg by mouth 2 (two) times daily. 2 Tablets in the AM and 1 Tablet in the PM   gabapentin 300 MG capsule Commonly known as:  NEURONTIN Take 300 mg by mouth at bedtime.   hydrALAZINE 100 MG tablet Commonly known as:  APRESOLINE TAKE ONE TABLET BY MOUTH THREE TIMES A DAY.   Insulin Glargine 100 UNIT/ML Solostar Pen Commonly known as:  LANTUS SOLOSTAR INJECT 30 UNITS UNDER THE SKIN EVERY MORNING AND INJECT 15 UNITS EVERY NIGHT   insulin lispro 100 UNIT/ML KiwkPen Commonly known as:  HUMALOG KWIKPEN INJECT 0-60 UNITS INTO THE SKIN 3 TIMES DAILY WITH MEALS. INJECT INTO THE SKIN AS DIRECTED BASED ON SLIDING SCALE   Melatonin 5 MG Tabs Take 1 tablet (5 mg total) by mouth at bedtime.   metolazone 5 MG tablet Commonly known as:  ZAROXOLYN Take 1 tablet (5 mg total) by mouth daily.   Potassium Chloride ER 20 MEQ Tbcr Take 20 mEq by mouth 2 (two) times daily.   potassium chloride SA 20 MEQ tablet Commonly known as:  K-DUR,KLOR-CON TAKE ONE TABLET BY MOUTH TWICE DAILY   verapamil 180 MG 24 hr capsule Commonly known as:  VERELAN PM Take 1 capsule (180 mg total) by mouth at bedtime.        ALLERGIES: No Known Allergies   REVIEW OF SYSTEMS: A comprehensive ROS was conducted with the patient and is negative except as per HPI and below:  Review of Systems  Constitutional: Positive for  malaise/fatigue. Negative for weight loss.  HENT: Negative for congestion and sore throat.   Eyes: Positive for blurred vision and double vision.  Respiratory: Negative for cough and sputum production.   Cardiovascular: Negative for chest pain and palpitations.  Gastrointestinal: Negative for diarrhea and nausea.  Genitourinary: Positive for frequency.  Musculoskeletal: Positive for back pain and joint pain.  Skin: Negative.  Neurological: Positive for tingling and sensory change.  Endo/Heme/Allergies: Negative for polydipsia.  Psychiatric/Behavioral: Negative for depression. The patient is not nervous/anxious.       OBJECTIVE:   VITAL SIGNS: Ht 5\' 10"  (1.778 m)   Wt 272 lb 6.4 oz (123.6 kg)   BMI 39.09 kg/m    PHYSICAL EXAM:  General: Pt appears well and is in NAD  Hydration: Well-hydrated with moist mucous membranes and good skin turgor  HEENT: Head: Unremarkable with good dentition. Oropharynx clear without exudate.  Eyes: External eye exam normal without stare, lid lag or exophthalmos.  EOM intact.   Neck: General: Supple without adenopathy or carotid bruits. Thyroid: Thyroid size normal.  No goiter or nodules appreciated. No thyroid bruit.  Lungs: Clear with good BS bilat with no rales, rhonchi, or wheezes  Heart: RRR with normal S1 and S2 and no gallops; no murmurs; no rub  Abdomen: Normoactive bowel sounds, soft, nontender, without masses or organomegaly palpable  Extremities:  Lower extremities - No pretibial edema. No lesions.  Skin: Normal texture and temperature to palpation. No rash noted. No Acanthosis nigricans/skin tags. No lipohypertrophy.  Neuro: MS is good with appropriate affect, pt is alert and Ox3    DM foot exam: 12/23 The skin of the feet is intact without sores or ulcerations. Pt with b/l plantar callous formation  The pedal pulses are 2+ on right and 2+ on left. The sensation is intact to a screening 5.07, 10 gram monofilament bilaterally   DATA  REVIEWED:  Lab Results  Component Value Date   HGBA1C 5.9 (A) 08/18/2018   HGBA1C 7.0 (A) 02/14/2018   HGBA1C 7.7 11/11/2017   Lab Results  Component Value Date   MICROALBUR 72.5 06/16/2016   LDLCALC 69 05/10/2017   CREATININE 6.63 (H) 06/29/2018   Lab Results  Component Value Date   MICRALBCREAT 1,926.1 (H) 06/16/2013    Lab Results  Component Value Date   CHOL 175 05/10/2017   HDL 35 (L) 05/10/2017   LDLCALC 69 05/10/2017   LDLDIRECT 99 09/16/2010   TRIG 353 (H) 05/10/2017   CHOLHDL 5.0 05/10/2017        ASSESSMENT / PLAN / RECOMMENDATIONS:   1) Type 2 Diabetes Mellitus, With retinopathy, 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 Goal A1c < 7.0 %.    Plan: GENERAL:  Poorly controlled diabetes due to recurrent hypoglycemia which has resulted in hypoglycemia unawareness.   We need to relax his glucose readings for the next few months in the hopes that his awareness improves I have discussed with the patient the pathophysiology of diabetes. We went over the natural progression of the disease. We talked about both insulin resistance and insulin deficiency. We stressed the importance of lifestyle changes including diet and exercise. I explained the complications associated with diabetes including retinopathy, nephropathy, neuropathy as well as increased risk of cardiovascular disease. We went over the benefit seen with glycemic control.    I explained to the patient that diabetic patients are at higher than normal risk for amputations. The patient was informed that diabetes is the number one cause of non-traumatic amputations in [place]. The patient was advised to look and examine their feet , wear proper fitting shoes and not to go barefoot.  Discussed pharmacokinetics of basal/bolus insulin and the importance of taking prandial insulin with meals.   We also discussed avoiding sugar-sweetened beverages and snacks, when possible.   We  also discussed the  importance of having glucose data during office visits.   MEDICATIONS:  Decrease Lantus to 30 units ONCE daily   Start Humalog 10 units with meals, pt is concerned about this , so I advised him to start with 5 units and see how his glucose is at the next meal.   EDUCATION / INSTRUCTIONS:  BG monitoring instructions: Patient is instructed to check his blood sugars 4 times a day, before meals and bedtime.  Call [facility] if: BG persistently < 70 or > 300. . I reviewed the Rule of 15 for the treatment of hypoglycemia in detail with the patient. Literature supplied.   2) Diabetic complications:   Eye: Does have known diabetic retinopathy.   Neuro/ Feet: Does have known diabetic peripheral neuropathy.  Renal: Patient does have known baseline CKD. He is not on an ACEI/ARB at present.   3) Lipids: Patient is  on a statin.    4) Hypertension: He is above goal of < 140/90 mmHg. Will defer further management to PCP.     F/u in 4 weeks   Signed electronically by: Mack Guise, MD  [facility]  [facility] [address] [place], [place] [address] Phone: [phone] FAX: [phone]   CC: Forrest Moron, MD [place] [address] Phone: [phone]  Fax: [phone]    Return to Endocrinology clinic as below: Future Appointments  Date Time Provider [place]  08/22/2018 10:30 AM Mousa Prout, Melanie Crazier, MD [facility] None  09/13/2018  8:20 AM Forrest Moron, MD [facility]  11/04/2018 11:45 AM Evelina Bucy, DPM [facility]  01/06/2019  2:00 PM Parrett, Fonnie Mu, NP [facility] None

## 2018-08-22 NOTE — Patient Instructions (Addendum)
-   Decrease Lantus to 30 units ONCE day (Take it the same time every day ) - Take Humalog 10 units with each meal   - Check sugar before each meal and bedtime  - Bring meter on next visit  - HOW TO TREAT LOW BLOOD SUGARS (Blood sugar LESS THAN 70 MG/DL)  Please follow the RULE OF 15 for the treatment of hypoglycemia treatment (when your (blood sugars are less than 70 mg/dL)    STEP 1: Take 15 grams of carbohydrates when your blood sugar is low, which includes:   3-4 GLUCOSE TABS  OR  3-4 OZ OF JUICE OR REGULAR SODA OR  ONE TUBE OF GLUCOSE GEL     STEP 2: RECHECK blood sugar in 15 MINUTES STEP 3: If your blood sugar is still low at the 15 minute recheck --> then, go back to STEP 1 and treat AGAIN with another 15 grams of carbohydrates.   -

## 2018-08-26 ENCOUNTER — Telehealth: Payer: Self-pay | Admitting: Family Medicine

## 2018-08-26 NOTE — Telephone Encounter (Signed)
Per faxed received he is a previous patient of [facility] and they will not schedule him Do you still want me to send him to pain management???  Letter is under financial correspondence in demographics   Thank

## 2018-08-27 NOTE — Telephone Encounter (Signed)
He has violated agreements in several practices. Can we try [facility] or  [facility]?

## 2018-08-30 ENCOUNTER — Other Ambulatory Visit: Payer: Self-pay | Admitting: Family Medicine

## 2018-08-30 DIAGNOSIS — N184 Chronic kidney disease, stage 4 (severe): Secondary | ICD-10-CM

## 2018-08-30 DIAGNOSIS — IMO0002 Reserved for concepts with insufficient information to code with codable children: Secondary | ICD-10-CM

## 2018-08-30 DIAGNOSIS — E0865 Diabetes mellitus due to underlying condition with hyperglycemia: Secondary | ICD-10-CM

## 2018-08-30 DIAGNOSIS — Z794 Long term (current) use of insulin: Secondary | ICD-10-CM

## 2018-08-30 DIAGNOSIS — E083499 Diabetes mellitus due to underlying condition with severe nonproliferative diabetic retinopathy without macular edema, unspecified eye: Secondary | ICD-10-CM

## 2018-09-02 ENCOUNTER — Telehealth: Payer: Self-pay | Admitting: Podiatry

## 2018-09-02 DIAGNOSIS — N185 Chronic kidney disease, stage 5: Secondary | ICD-10-CM | POA: Diagnosis not present

## 2018-09-02 DIAGNOSIS — I10 Essential (primary) hypertension: Secondary | ICD-10-CM | POA: Diagnosis not present

## 2018-09-02 DIAGNOSIS — I120 Hypertensive chronic kidney disease with stage 5 chronic kidney disease or end stage renal disease: Secondary | ICD-10-CM | POA: Diagnosis not present

## 2018-09-02 DIAGNOSIS — E1122 Type 2 diabetes mellitus with diabetic chronic kidney disease: Secondary | ICD-10-CM | POA: Diagnosis not present

## 2018-09-02 DIAGNOSIS — N2581 Secondary hyperparathyroidism of renal origin: Secondary | ICD-10-CM | POA: Diagnosis not present

## 2018-09-02 DIAGNOSIS — Z7689 Persons encountering health services in other specified circumstances: Secondary | ICD-10-CM | POA: Diagnosis not present

## 2018-09-02 NOTE — Telephone Encounter (Signed)
Pt called and left message checking on status of diabetic shoes.  I called back and left message and pt immediately called back.  I explained that Dr Kaleen Mask office note was dated 12.19 and the paperwork stated last office visit was 55.3.19 so they did not match. I refaxed it yesterday 1.2.20 to Dr Kaleen Mask office with a note asking her to resign with the date of his appt 12.19.19.   I explained this to patient and he understood and I also explained that I asked them to fax it directly to me.He said thank you

## 2018-09-13 ENCOUNTER — Ambulatory Visit: Payer: Medicare Other | Admitting: Family Medicine

## 2018-09-16 ENCOUNTER — Other Ambulatory Visit: Payer: Self-pay | Admitting: Family Medicine

## 2018-09-16 DIAGNOSIS — M25532 Pain in left wrist: Secondary | ICD-10-CM | POA: Diagnosis not present

## 2018-09-16 DIAGNOSIS — M13132 Monoarthritis, not elsewhere classified, left wrist: Secondary | ICD-10-CM | POA: Diagnosis not present

## 2018-09-16 DIAGNOSIS — L03114 Cellulitis of left upper limb: Secondary | ICD-10-CM | POA: Diagnosis not present

## 2018-09-17 ENCOUNTER — Emergency Department (HOSPITAL_COMMUNITY): Payer: Medicare Other

## 2018-09-17 ENCOUNTER — Inpatient Hospital Stay (HOSPITAL_COMMUNITY)
Admission: EM | Admit: 2018-09-17 | Discharge: 2018-09-20 | DRG: 603 | Disposition: A | Discharge status: Home / Self Care | Payer: Medicare Other | Attending: Internal Medicine | Admitting: Internal Medicine

## 2018-09-17 ENCOUNTER — Encounter (HOSPITAL_COMMUNITY): Payer: Self-pay | Admitting: Emergency Medicine

## 2018-09-17 DIAGNOSIS — E11319 Type 2 diabetes mellitus with unspecified diabetic retinopathy without macular edema: Secondary | ICD-10-CM | POA: Diagnosis not present

## 2018-09-17 DIAGNOSIS — R52 Pain, unspecified: Secondary | ICD-10-CM

## 2018-09-17 DIAGNOSIS — H5461 Unqualified visual loss, right eye, normal vision left eye: Secondary | ICD-10-CM | POA: Diagnosis present

## 2018-09-17 DIAGNOSIS — G4733 Obstructive sleep apnea (adult) (pediatric): Secondary | ICD-10-CM | POA: Diagnosis not present

## 2018-09-17 DIAGNOSIS — M12832 Other specific arthropathies, not elsewhere classified, left wrist: Secondary | ICD-10-CM | POA: Diagnosis not present

## 2018-09-17 DIAGNOSIS — Z79899 Other long term (current) drug therapy: Secondary | ICD-10-CM | POA: Diagnosis not present

## 2018-09-17 DIAGNOSIS — N185 Chronic kidney disease, stage 5: Secondary | ICD-10-CM | POA: Diagnosis not present

## 2018-09-17 DIAGNOSIS — E1142 Type 2 diabetes mellitus with diabetic polyneuropathy: Secondary | ICD-10-CM | POA: Diagnosis present

## 2018-09-17 DIAGNOSIS — L209 Atopic dermatitis, unspecified: Secondary | ICD-10-CM | POA: Diagnosis not present

## 2018-09-17 DIAGNOSIS — Z794 Long term (current) use of insulin: Secondary | ICD-10-CM | POA: Diagnosis not present

## 2018-09-17 DIAGNOSIS — L818 Other specified disorders of pigmentation: Secondary | ICD-10-CM

## 2018-09-17 DIAGNOSIS — L03114 Cellulitis of left upper limb: Principal | ICD-10-CM

## 2018-09-17 DIAGNOSIS — Z7982 Long term (current) use of aspirin: Secondary | ICD-10-CM

## 2018-09-17 DIAGNOSIS — M25562 Pain in left knee: Secondary | ICD-10-CM | POA: Diagnosis not present

## 2018-09-17 DIAGNOSIS — E11649 Type 2 diabetes mellitus with hypoglycemia without coma: Secondary | ICD-10-CM | POA: Diagnosis present

## 2018-09-17 DIAGNOSIS — G8929 Other chronic pain: Secondary | ICD-10-CM | POA: Diagnosis not present

## 2018-09-17 DIAGNOSIS — I132 Hypertensive heart and chronic kidney disease with heart failure and with stage 5 chronic kidney disease, or end stage renal disease: Secondary | ICD-10-CM | POA: Diagnosis present

## 2018-09-17 DIAGNOSIS — E1122 Type 2 diabetes mellitus with diabetic chronic kidney disease: Secondary | ICD-10-CM | POA: Diagnosis present

## 2018-09-17 DIAGNOSIS — M1990 Unspecified osteoarthritis, unspecified site: Secondary | ICD-10-CM | POA: Diagnosis present

## 2018-09-17 DIAGNOSIS — I120 Hypertensive chronic kidney disease with stage 5 chronic kidney disease or end stage renal disease: Secondary | ICD-10-CM | POA: Diagnosis not present

## 2018-09-17 DIAGNOSIS — E785 Hyperlipidemia, unspecified: Secondary | ICD-10-CM

## 2018-09-17 DIAGNOSIS — E662 Morbid (severe) obesity with alveolar hypoventilation: Secondary | ICD-10-CM

## 2018-09-17 DIAGNOSIS — I5032 Chronic diastolic (congestive) heart failure: Secondary | ICD-10-CM | POA: Diagnosis present

## 2018-09-17 DIAGNOSIS — Z9989 Dependence on other enabling machines and devices: Secondary | ICD-10-CM

## 2018-09-17 DIAGNOSIS — IMO0002 Reserved for concepts with insufficient information to code with codable children: Secondary | ICD-10-CM

## 2018-09-17 DIAGNOSIS — Z6834 Body mass index (BMI) 34.0-34.9, adult: Secondary | ICD-10-CM

## 2018-09-17 DIAGNOSIS — M25532 Pain in left wrist: Secondary | ICD-10-CM | POA: Diagnosis present

## 2018-09-17 DIAGNOSIS — F17210 Nicotine dependence, cigarettes, uncomplicated: Secondary | ICD-10-CM | POA: Diagnosis present

## 2018-09-17 DIAGNOSIS — M13132 Monoarthritis, not elsewhere classified, left wrist: Secondary | ICD-10-CM | POA: Diagnosis present

## 2018-09-17 DIAGNOSIS — Z720 Tobacco use: Secondary | ICD-10-CM | POA: Diagnosis not present

## 2018-09-17 DIAGNOSIS — G4730 Sleep apnea, unspecified: Secondary | ICD-10-CM | POA: Diagnosis present

## 2018-09-17 DIAGNOSIS — I770 Arteriovenous fistula, acquired: Secondary | ICD-10-CM

## 2018-09-17 DIAGNOSIS — E1165 Type 2 diabetes mellitus with hyperglycemia: Secondary | ICD-10-CM | POA: Diagnosis present

## 2018-09-17 DIAGNOSIS — M79642 Pain in left hand: Secondary | ICD-10-CM | POA: Diagnosis not present

## 2018-09-17 DIAGNOSIS — E669 Obesity, unspecified: Secondary | ICD-10-CM | POA: Diagnosis present

## 2018-09-17 DIAGNOSIS — M7989 Other specified soft tissue disorders: Secondary | ICD-10-CM | POA: Diagnosis not present

## 2018-09-17 DIAGNOSIS — J449 Chronic obstructive pulmonary disease, unspecified: Secondary | ICD-10-CM | POA: Diagnosis present

## 2018-09-17 DIAGNOSIS — I10 Essential (primary) hypertension: Secondary | ICD-10-CM | POA: Diagnosis present

## 2018-09-17 LAB — GLUCOSE, CAPILLARY
Glucose-Capillary: 150 mg/dL — ABNORMAL HIGH (ref 70–99)
Glucose-Capillary: 284 mg/dL — ABNORMAL HIGH (ref 70–99)

## 2018-09-17 LAB — SYNOVIAL CELL COUNT + DIFF, W/ CRYSTALS
Crystals, Fluid: NONE SEEN
Eosinophils-Synovial: 0 % (ref 0–1)
Lymphocytes-Synovial Fld: 7 % (ref 0–20)
Monocyte-Macrophage-Synovial Fluid: 22 % — ABNORMAL LOW (ref 50–90)
Neutrophil, Synovial: 71 % — ABNORMAL HIGH (ref 0–25)
WBC, Synovial: 3190 /mm3 — ABNORMAL HIGH (ref 0–200)

## 2018-09-17 LAB — C-REACTIVE PROTEIN: CRP: 2.1 mg/dL — ABNORMAL HIGH (ref <1.0)

## 2018-09-17 LAB — CBC WITH DIFFERENTIAL/PLATELET
Abs Immature Granulocytes: 0.04 10*3/uL (ref 0.00–0.07)
Basophils Absolute: 0 10*3/uL (ref 0.0–0.1)
Basophils Relative: 1 %
EOS ABS: 0.3 10*3/uL (ref 0.0–0.5)
Eosinophils Relative: 3 %
HCT: 40.2 % (ref 39.0–52.0)
Hemoglobin: 12.3 g/dL — ABNORMAL LOW (ref 13.0–17.0)
Immature Granulocytes: 1 %
Lymphocytes Relative: 15 %
Lymphs Abs: 1.3 10*3/uL (ref 0.7–4.0)
MCH: 28.7 pg (ref 26.0–34.0)
MCHC: 30.6 g/dL (ref 30.0–36.0)
MCV: 93.9 fL (ref 80.0–100.0)
Monocytes Absolute: 0.5 10*3/uL (ref 0.1–1.0)
Monocytes Relative: 6 %
Neutro Abs: 6.6 10*3/uL (ref 1.7–7.7)
Neutrophils Relative %: 74 %
Platelets: 174 10*3/uL (ref 150–400)
RBC: 4.28 MIL/uL (ref 4.22–5.81)
RDW: 12.8 % (ref 11.5–15.5)
WBC: 8.8 10*3/uL (ref 4.0–10.5)
nRBC: 0 % (ref 0.0–0.2)

## 2018-09-17 LAB — BASIC METABOLIC PANEL
Anion gap: 19 — ABNORMAL HIGH (ref 5–15)
BUN: 101 mg/dL — ABNORMAL HIGH (ref 6–20)
CALCIUM: 9.2 mg/dL (ref 8.9–10.3)
CO2: 27 mmol/L (ref 22–32)
Chloride: 95 mmol/L — ABNORMAL LOW (ref 98–111)
Creatinine, Ser: 8.28 mg/dL — ABNORMAL HIGH (ref 0.61–1.24)
GFR calc Af Amer: 8 mL/min — ABNORMAL LOW (ref >60)
GFR calc non Af Amer: 7 mL/min — ABNORMAL LOW (ref >60)
Glucose, Bld: 354 mg/dL — ABNORMAL HIGH (ref 70–99)
Potassium: 3.4 mmol/L — ABNORMAL LOW (ref 3.5–5.1)
SODIUM: 141 mmol/L (ref 135–145)

## 2018-09-17 LAB — CBG MONITORING, ED
Glucose-Capillary: 314 mg/dL — ABNORMAL HIGH (ref 70–99)
Glucose-Capillary: 43 mg/dL — CL (ref 70–99)
Glucose-Capillary: 55 mg/dL — ABNORMAL LOW (ref 70–99)
Glucose-Capillary: 143 mg/dL — ABNORMAL HIGH (ref 70–99)

## 2018-09-17 LAB — SEDIMENTATION RATE: Sed Rate: 56 mm/hr — ABNORMAL HIGH (ref 0–16)

## 2018-09-17 LAB — URIC ACID: Uric Acid, Serum: 8.6 mg/dL (ref 3.7–8.6)

## 2018-09-17 MED ORDER — LIDOCAINE HCL (PF) 1 % IJ SOLN
INTRAMUSCULAR | Status: AC
  Filled 2018-09-17: qty 30

## 2018-09-17 MED ORDER — MORPHINE SULFATE (PF) 4 MG/ML IV SOLN
4.0000 mg | Freq: Once | INTRAVENOUS | Status: AC
  Administered 2018-09-17: 4 mg via INTRAVENOUS
  Filled 2018-09-17: qty 1

## 2018-09-17 MED ORDER — VANCOMYCIN HCL 10 G IV SOLR
2000.0000 mg | Freq: Once | INTRAVENOUS | Status: AC
  Administered 2018-09-17: 2000 mg via INTRAVENOUS
  Filled 2018-09-17: qty 2000

## 2018-09-17 MED ORDER — ASPIRIN EC 81 MG PO TBEC: 81.0000 mg | DELAYED_RELEASE_TABLET | Freq: Every day | ORAL | Status: DC

## 2018-09-17 MED ORDER — LORATADINE 10 MG PO TABS
10.0000 mg | ORAL_TABLET | Freq: Every day | ORAL | Status: DC
  Administered 2018-09-18 – 2018-09-20 (×3): 10 mg via ORAL
  Filled 2018-09-17 (×3): qty 1

## 2018-09-17 MED ORDER — INSULIN ASPART 100 UNIT/ML ~~LOC~~ SOLN
10.0000 [IU] | Freq: Once | SUBCUTANEOUS | Status: AC
  Administered 2018-09-17: 10 [IU] via SUBCUTANEOUS

## 2018-09-17 MED ORDER — MELATONIN 3 MG PO TABS
3.0000 mg | ORAL_TABLET | Freq: Every day | ORAL | Status: DC
  Administered 2018-09-17 – 2018-09-19 (×3): 3 mg via ORAL
  Filled 2018-09-17 (×4): qty 1

## 2018-09-17 MED ORDER — ONDANSETRON HCL 4 MG/2ML IJ SOLN: 4.0000 mg | Freq: Four times a day (QID) | INTRAMUSCULAR | Status: DC | PRN

## 2018-09-17 MED ORDER — GABAPENTIN 300 MG PO CAPS
300.0000 mg | ORAL_CAPSULE | Freq: Every day | ORAL | Status: DC
  Administered 2018-09-17 – 2018-09-19 (×3): 300 mg via ORAL
  Filled 2018-09-17 (×3): qty 1

## 2018-09-17 MED ORDER — SODIUM CHLORIDE 0.9 % IV SOLN: 1.0000 g | Freq: Once | INTRAVENOUS | Status: DC

## 2018-09-17 MED ORDER — VERAPAMIL HCL ER 180 MG PO TBCR
180.0000 mg | EXTENDED_RELEASE_TABLET | Freq: Every day | ORAL | Status: DC
  Administered 2018-09-17 – 2018-09-19 (×3): 180 mg via ORAL
  Filled 2018-09-17 (×3): qty 1

## 2018-09-17 MED ORDER — ONDANSETRON HCL 4 MG PO TABS: 4.0000 mg | ORAL_TABLET | Freq: Four times a day (QID) | ORAL | Status: DC | PRN

## 2018-09-17 MED ORDER — CARVEDILOL 25 MG PO TABS
25.0000 mg | ORAL_TABLET | Freq: Two times a day (BID) | ORAL | Status: DC
  Administered 2018-09-17 – 2018-09-20 (×6): 25 mg via ORAL
  Filled 2018-09-17 (×6): qty 1

## 2018-09-17 MED ORDER — DEXTROSE 50 % IV SOLN
INTRAVENOUS | Status: AC
  Administered 2018-09-17: 17:00:00
  Filled 2018-09-17: qty 50

## 2018-09-17 MED ORDER — FLUTICASONE PROPIONATE 50 MCG/ACT NA SUSP
2.0000 | Freq: Every day | NASAL | Status: DC
  Administered 2018-09-18 – 2018-09-20 (×2): 2 via NASAL
  Filled 2018-09-17 (×2): qty 16

## 2018-09-17 MED ORDER — ACETAMINOPHEN 650 MG RE SUPP: 650.0000 mg | Freq: Four times a day (QID) | RECTAL | Status: DC | PRN

## 2018-09-17 MED ORDER — HYDROMORPHONE HCL 2 MG PO TABS
1.0000 mg | ORAL_TABLET | Freq: Four times a day (QID) | ORAL | Status: DC | PRN
  Administered 2018-09-17 – 2018-09-18 (×3): 1 mg via ORAL
  Filled 2018-09-17 (×3): qty 1

## 2018-09-17 MED ORDER — INSULIN GLARGINE 100 UNIT/ML ~~LOC~~ SOLN
20.0000 [IU] | SUBCUTANEOUS | Status: DC
  Administered 2018-09-18 – 2018-09-20 (×3): 20 [IU] via SUBCUTANEOUS
  Filled 2018-09-17 (×3): qty 0.2

## 2018-09-17 MED ORDER — MORPHINE SULFATE (PF) 2 MG/ML IV SOLN
2.0000 mg | Freq: Once | INTRAVENOUS | Status: AC
  Administered 2018-09-17: 2 mg via INTRAVENOUS
  Filled 2018-09-17: qty 1

## 2018-09-17 MED ORDER — CALCIUM CARBONATE ANTACID 500 MG PO CHEW: 1.0000 | CHEWABLE_TABLET | Freq: Three times a day (TID) | ORAL | Status: DC | PRN

## 2018-09-17 MED ORDER — MELATONIN 5 MG PO TABS: 5.0000 mg | ORAL_TABLET | Freq: Every day | ORAL | Status: DC

## 2018-09-17 MED ORDER — IOPAMIDOL (ISOVUE-M 300) INJECTION 61%
INTRAMUSCULAR | Status: AC
  Filled 2018-09-17: qty 15

## 2018-09-17 MED ORDER — DEXTROSE 50 % IV SOLN: 1.0000 | Freq: Once | INTRAVENOUS | Status: DC

## 2018-09-17 MED ORDER — CALCIUM CARBONATE ANTACID 500 MG PO CHEW
1.0000 | CHEWABLE_TABLET | Freq: Once | ORAL | Status: AC
  Administered 2018-09-17: 600 mg via ORAL
  Filled 2018-09-17: qty 3

## 2018-09-17 MED ORDER — HEPARIN SODIUM (PORCINE) 5000 UNIT/ML IJ SOLN
5000.0000 [IU] | Freq: Three times a day (TID) | INTRAMUSCULAR | Status: DC
  Administered 2018-09-17 – 2018-09-20 (×8): 5000 [IU] via SUBCUTANEOUS
  Filled 2018-09-17 (×8): qty 1

## 2018-09-17 MED ORDER — INSULIN ASPART 100 UNIT/ML ~~LOC~~ SOLN
0.0000 [IU] | Freq: Three times a day (TID) | SUBCUTANEOUS | Status: DC
  Administered 2018-09-17: 1 [IU] via SUBCUTANEOUS
  Administered 2018-09-18: 7 [IU] via SUBCUTANEOUS
  Administered 2018-09-18: 3 [IU] via SUBCUTANEOUS
  Administered 2018-09-18: 9 [IU] via SUBCUTANEOUS
  Administered 2018-09-19: 7 [IU] via SUBCUTANEOUS
  Administered 2018-09-19: 9 [IU] via SUBCUTANEOUS
  Administered 2018-09-20: 5 [IU] via SUBCUTANEOUS
  Administered 2018-09-20: 3 [IU] via SUBCUTANEOUS

## 2018-09-17 MED ORDER — ACETAMINOPHEN 325 MG PO TABS
650.0000 mg | ORAL_TABLET | Freq: Four times a day (QID) | ORAL | Status: DC | PRN
  Administered 2018-09-20: 650 mg via ORAL
  Filled 2018-09-17 (×2): qty 2

## 2018-09-17 MED ORDER — FUROSEMIDE 20 MG PO TABS
120.0000 mg | ORAL_TABLET | Freq: Every morning | ORAL | Status: DC
  Administered 2018-09-18: 120 mg via ORAL
  Filled 2018-09-17 (×2): qty 6

## 2018-09-17 MED ORDER — INSULIN ASPART 100 UNIT/ML ~~LOC~~ SOLN
0.0000 [IU] | Freq: Every day | SUBCUTANEOUS | Status: DC
  Administered 2018-09-17 – 2018-09-18 (×2): 3 [IU] via SUBCUTANEOUS
  Administered 2018-09-19: 4 [IU] via SUBCUTANEOUS

## 2018-09-17 MED ORDER — NICOTINE 21 MG/24HR TD PT24
21.0000 mg | MEDICATED_PATCH | Freq: Every day | TRANSDERMAL | Status: DC
  Administered 2018-09-17 – 2018-09-20 (×4): 21 mg via TRANSDERMAL
  Filled 2018-09-17 (×4): qty 1

## 2018-09-17 MED ORDER — LIDOCAINE HCL (PF) 1 % IJ SOLN
INTRAMUSCULAR | Status: AC
  Filled 2018-09-17: qty 5

## 2018-09-17 MED ORDER — HYDROMORPHONE HCL 2 MG PO TABS: 1.0000 mg | ORAL_TABLET | Freq: Once | ORAL | Status: DC | PRN

## 2018-09-17 MED ORDER — FUROSEMIDE 20 MG PO TABS
80.0000 mg | ORAL_TABLET | Freq: Every evening | ORAL | Status: DC
  Administered 2018-09-17 – 2018-09-18 (×2): 80 mg via ORAL
  Filled 2018-09-17 (×2): qty 4

## 2018-09-17 MED ORDER — FERROUS SULFATE 325 (65 FE) MG PO TABS
325.0000 mg | ORAL_TABLET | Freq: Every day | ORAL | Status: DC
  Administered 2018-09-18 – 2018-09-20 (×3): 325 mg via ORAL
  Filled 2018-09-17 (×3): qty 1

## 2018-09-17 MED ORDER — SODIUM CHLORIDE 0.9 % IV SOLN
2.0000 g | INTRAVENOUS | Status: DC
  Administered 2018-09-17 – 2018-09-19 (×3): 2 g via INTRAVENOUS
  Filled 2018-09-17 (×4): qty 20

## 2018-09-17 MED ORDER — ATORVASTATIN CALCIUM 80 MG PO TABS
80.0000 mg | ORAL_TABLET | Freq: Every day | ORAL | Status: DC
  Administered 2018-09-18 – 2018-09-19 (×2): 80 mg via ORAL
  Filled 2018-09-17 (×3): qty 1

## 2018-09-17 MED ORDER — HYDRALAZINE HCL 25 MG PO TABS
100.0000 mg | ORAL_TABLET | Freq: Three times a day (TID) | ORAL | Status: DC
  Administered 2018-09-17 – 2018-09-20 (×8): 100 mg via ORAL
  Filled 2018-09-17 (×10): qty 4

## 2018-09-17 NOTE — Progress Notes (Signed)
Pharmacy Antibiotic Note  Jerry Cantrell is a 53 y.o. male admitted on 09/17/2018 with cellulitis with concern for septic joint. Pharmacy has been consulted for Vancomycin dosing.  L-wrist aspiration in IR today with fluid sent for analysis.  WBC is within normal limits. ESR/CRP are elevated. Patient is afebrile. May need operation.   Patient has CKD stage 5 in setting of IDDM. SCr is 8.28 today which is up from baseline ~3.3 to 3.6. Normalized CrCL ~ 10 mL/min.   Plan: Vancomycin 2000 mg IV x1, then will do pulse dosing based on levels.  Recommended add Rocephin 2g IV daily until culture results return. Ok'd per Nuala Alpha, PA in ED.  Monitor renal function, any UOP, and renal plans.  Monitor clinical status and culture results.   Height: 6' (182.9 cm) Weight: 251 lb (113.9 kg) IBW/kg (Calculated) : 77.6  Temp (24hrs), Avg:98.3 F (36.8 C), Min:98.3 F (36.8 C), Max:98.3 F (36.8 C)  Recent Labs  Lab 09/17/18 0828  WBC 8.8  CREATININE 8.28*    Estimated Creatinine Clearance: 13.4 mL/min (A) (by C-G formula based on SCr of 8.28 mg/dL (H)).    No Known Allergies  Antimicrobials this admission: Vancomycin 1/18 >>  Dose adjustments this admission:   Microbiology results: 1/18 BCx >> 1/18 Joint fluid >>  Thank you for allowing pharmacy to be a part of this patient's care.  Sloan Leiter, PharmD, BCPS, BCCCP Clinical Pharmacist Clinical phone 09/17/2018 until 3:30PM [phone] Please refer to [facility] for Jeff numbers 09/17/2018 3:04 PM

## 2018-09-17 NOTE — Progress Notes (Signed)
Pt new admit from ED alert and oriented , independent, Dx left hand cellulitis, with left AV shunt but not dialysis pt, with right arm peripheral IV line.

## 2018-09-17 NOTE — Progress Notes (Signed)
Pt's BP at this time is 185/83. Hydralazine PO was given at 1835. Pt asymptomatic, denies dizziness or headache. Paged MD on call. No other orders received. But continue to monitor pt.

## 2018-09-17 NOTE — Procedures (Signed)
Interventional Radiology Procedure:   Indications: Swollen and painful left wrist, concern for septic joint  Procedure: US guided left wrist aspiration  Findings: Small joint effusion.  1 ml of clear/yellowish fluid removed  Complications: None     EBL: None  Plan: Send fluid for analysis.    Abelardo Seidner R. Anselm Pancoast, MD  Pager: [phone]

## 2018-09-17 NOTE — H&P (Addendum)
Date: 09/17/2018               Patient Name:  Jerry Cantrell MRN: [id number]  DOB: Mar 01, 1966 Age / Sex: 53 y.o., male   PCP: Forrest Moron, MD         Medical Service: Internal Medicine Teaching Service         Attending Physician: Dr. Beryle Beams    First Contact: Dr. Annie Paras Pager: [phone]  Second Contact: Dr. 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 This morning, the area appeared swollen. The pain worsens with movement. He took three ibuprofen for pain, although he knows he should avoid these due to his kidney disease. The ibuprofen did not help his pain. He denies any traumatic injury to the area and he denies IV drug use. He has a history of left knee pain that has been intermittent for years. He has no other joint pain at this time. He has never had pain like this before and has no history of gout. He had a left radiocephalic AVF placed about two years ago, but it has never been accessed. He denies fevers, but endorses chills.  Upon arrival to the ED, patient was afebrile and hemodynamically stable. No leukocytosis. Labs otherwise significant for Hb 12.3 (baseline), K 3.4, glucose 354, BUN 101, and Cr 8.28 (elevated from 6.63 three months ago), ESR elevated to 56, CRP elevated to 2.1, uric acid 8.6. Left wrist and hand xrays showed soft tissue swelling and subtle erosions of the third and second PIP joints. The patient underwent arthrocentesis with IR, which yielded 37m of clear yellowish fluid. He was given morphine for pain and was started on vancomycin and ceftriaxone.  Meds:  Current  Facility-Administ[facility] Medic[facility]18 [facility]  Medication  . triamcinolone acetonide (KENALOG) 10 MG/ML injection 10 mg   Current Meds  Medication Sig  . aspirin EC 81 MG tablet Take 1 tablet (81 mg total) by mouth daily.  .Marland Kitchenatorvastatin (LIPITOR) 80 MG tablet Take 1 tablet (80 mg total) by mouth daily at 6 PM. (Patient taking differently: Take 80 mg by mouth daily. )  . carvedilol (COREG) 25 MG tablet Take 1 tablet (25 mg total) by mouth 2 (two) times daily.  . cetirizine (ZYRTEC) 10 MG tablet TAKE ONE TABLET BY MOUTH ONCE DAILY FOR ALLERGIES (Patient taking differently: Take 10 mg by mouth daily. TAKE ONE TABLET BY MOUTH ONCE DAILY FOR ALLERGIES)  . clobetasol cream (TEMOVATE) 0.05 % APPLY TO AFFECED ARES(S) TOPICALLY TWICE DAILY (Patient taking differently: Apply 1 application topically 2 (two) times daily. APPLY TO AFFECED ARES(S) TOPICALLY TWICE DAILY)  . ferrous sulfate 325 (65 FE) MG tablet Take 1 tablet (325 mg total) by mouth daily.  . fluticasone (FLONASE) 50 MCG/ACT nasal spray USE TWO SPRAYS INTO EACH NOSTRILS DAILY (Patient taking differently: Place 2 sprays into both nostrils daily. USE TWO SPRAYS INTO EACH NOSTRILS DAILY)  . furosemide (LASIX) 80 MG tablet Take 80-160 mg by mouth 2 (two) times daily. 2 Tablets in the AM and 1 Tablet  in the PM   . gabapentin (NEURONTIN) 300 MG capsule Take 300 mg by mouth at bedtime.  . hydrALAZINE (APRESOLINE) 100 MG tablet TAKE ONE TABLET BY MOUTH THREE TIMES A DAY. (Patient taking differently: Take 100 mg by mouth 3 (three) times daily. )  . Insulin Glargine (LANTUS SOLOSTAR) 100 UNIT/ML Solostar Pen Inject 30 Units into the skin daily. INJECT 30 UNITS UNDER THE SKIN EVERY MORNING AND INJECT 15 UNITS EVERY NIGHT (Patient taking differently: Inject 35 Units into the skin daily. )  . insulin lispro (HUMALOG KWIKPEN) 100 UNIT/ML KwikPen Inject 0.1 mLs (10 Units total) into the skin 3 (three) times daily.  . Melatonin  5 MG TABS Take 1 tablet (5 mg total) by mouth at bedtime.  . metolazone (ZAROXOLYN) 5 MG tablet Take 1 tablet (5 mg total) by mouth daily.  . Potassium Chloride ER 20 MEQ TBCR Take 20 mEq by mouth 2 (two) times daily.  . verapamil (VERELAN PM) 180 MG 24 hr capsule Take 1 capsule (180 mg total) by mouth at bedtime. (Patient taking differently: Take 360 mg by mouth at bedtime. )    Allergies: Allergies as of 09/17/2018  . (No Known Allergies)   Past Medical History:  Diagnosis Date  . Acute diastolic heart failure ([place])   . CHF (congestive heart failure) ([place])   . Diabetes mellitus with nephropathy ([place]) 05/12/2012   Dr. Justin Mend follows  . Hemorrhoids 05/12/2012  . Hyperlipidemia   . Hypertension   . Lumbar spinal stenosis 05/03/2012   Mild with only right L4 nerve root encroachment, no neurogenic claudication   . Lumbar spondylosis 05/03/2012  . Meralgia paraesthetica 05/03/2012  . Meralgia paraesthetica 05/03/2012   On Lyrica which does improve pain.   Marland Kitchen Neuropathy in diabetes ([place]) 05/12/2012  . Obesity   . Pneumonia   . Retinopathy   . Sleep apnea    uses cpap  . Substance abuse ([place])   . Urinary incontinence 05/12/2012   Since the start of Sept. 2013    Past Surgical History:  Procedure Laterality Date  . ABDOMINAL SURGERY     Abscess I&D 2/2 infected hair  . AV FISTULA PLACEMENT Left 02/06/2016   Procedure: LEFT ARM RADIOCEPHALIC ARTERIOVENOUS (AV) FISTULA CREATION;  Surgeon: Rosetta Posner, MD;  Location: [facility];  Service: Vascular;  Laterality: Left;  . COLONOSCOPY W/ POLYPECTOMY     pt to bring records  . COLONOSCOPY WITH PROPOFOL N/A 06/05/2016   Procedure: COLONOSCOPY WITH PROPOFOL;  Surgeon: Doran Stabler, MD;  Location: [facility];  Service: Gastroenterology;  Laterality: N/A;  . TONSILLECTOMY      Family History: Father had HTN and prostate cancer. Sister has DM. One of 4 sons has HTN.  Social History: Lives with male partner. Smokes 1/2ppd (20 pack years). Former  heavy drinker and crack cocaine smoker. Last drink was five days ago. Last drug use was in is 30s.  Review of Systems: A complete ROS was negative except as per HPI.   Physical Exam: Blood pressure (!) 142/84, pulse 62, temperature 98.3 F (36.8 C), temperature source Oral, resp. rate 18, height 6' (1.829 m), weight 113.9 kg, SpO2 98 %.  Constitutional: Patient hypoglycemic to 46 when first assessed. Appears diaphoretic and is slow to respond to questions. HEENT: Pupils are equal, round, and reactive to light. EOM are normal. Scar on left cheek from cyst removal. Cardiovascular: Normal rate and regular rhythm. No murmurs, rubs, or gallops. Pulmonary/Chest: Effort normal. Clear to auscultation  bilaterally. No wheezes, rales, or rhonchi. Back: Multiple scars over upper back from cyst removals. Abdominal: Bowel sounds present. Soft, non-distended, non-tender. Ext: Left wrist and hand are swollen with mild erythema, increased warmth, and ttp. ROM is limited by pain. He has excoriations on the left palm and fingers 2/2 eczema. No lower extremity edema. Skin: Warm and dry.  Assessment & Plan by Problem: Active Problems:   Inflammatory arthropathy  Mr. 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 He was admitted for further evaluation and workup.  Inflammatory Arthropathy - Afebrile. No leukocytosis. - No known trauma to the area. No known IV drug use. Eczematous changes with excoriations present on the hands. - Synovial fluid aspirated from the left wrist showed 3,000 WBCs (71% neutrophils) and no crystals. Gram stain shows WBCs, but no organisms. Therefore, this is not likely to be septic arthritis or gout. His symptoms may be due to another inflammatory arthropathy or superficial cellulitis.  - Patient was evaluated by Dr. Grandville Silos with hand surgery  who recommends medical management of both infectious and non-infectious inflammatory etiologies and no surgical intervention at this time Plan - F/u synovial fluid cultures - Empiric therapy with ceftriaxone and vancomycin  - Pain control: Dilaudid 44m q6hrs PRN - Tele  DMII - Patient received D50 after hypoglycemic event in the ED (was given 10u novolog prior to going low) - last A1c was 5.9 one month ago - Current regimen: lantus 35 units daily and humalog 10u TID with meals Plan - Lantus 20u daily - SSI - Continue home gabapentin 3066mqhs  CKDV - Follows with Dr. WeJustin MendCurrently [facility] been [facility]. HD has not been initiated because the patient has felt well overall despite progressively worsening kidney function. - Cr is elevated from his baseline today, perhaps 2/2 NSAID use at home Plan - am BMP  HTN: Continue home coreg 2586mID, hydralazine 100m87mD, and verapamil 180mg27mly OSA: CPAP at night HLD: Continue lipitor 80mg 70my Tobacco use: nicotine patch  FEN: no IV fluids, renal diet, replace electrolytes as needed  DVT ppx: Rembrandt Lovenox Code status: FULL code  Dispo: Admit patient to Inpatient with expected length of stay greater than 2 midnights.  Signed: DorrelCorinne Ports/18/2020, 5:15 PM  Pager: [phone]

## 2018-09-17 NOTE — ED Notes (Signed)
Patient called out that he felt like his blood sugar was dropping. CBG obtained and informed PA. Patient given carb snack. Patient alert and orientated x4.

## 2018-09-17 NOTE — ED Provider Notes (Signed)
Medical screening examination/treatment/procedure(s) were conducted as a shared visit with non-physician practitioner(s) and myself.  I personally evaluated the patient during the encounter.  None 53 year old male presents with 5 days of left wrist pain with redness and swelling.  Denies any fever or chills.  On exam is erythematous and warm to the touch with decreased range of motion at the wrist.  Has elevated sed rate and C-reactive protein.  Will consult hand surgery for likely septic wrist   Lacretia Leigh, MD 09/17/18 1047

## 2018-09-17 NOTE — ED Notes (Signed)
Patient transported to IR 

## 2018-09-17 NOTE — ED Notes (Signed)
Patient transported to X-ray 

## 2018-09-17 NOTE — ED Provider Notes (Signed)
[facility] EMERGENCY DEPARTMENT Provider Note   CSN: [id number] Arrival date & time: 09/17/18  8938     History   Chief Complaint Chief Complaint  Patient presents with  . Vascular Access Problem    HPI Jerry Cantrell is a 53 y.o. male with history of diabetes, substance abuse, cysts CKD stage V, CHF, obesity COPD presents today for left wrist/hand pain and swelling.  Patient with atraumatic left wrist pain, swelling and erythema that has been present for the past 5 days.  Patient without history of trauma or injury to the area.  Of note patient does have AV fistula that was placed approximately 2 years ago however has not been used as of yet for dialysis.  Patient describes his pain as a severe, constant sharp pain that is worsened with palpation or any movement.  Patient denies history of fever, chills, trauma or any additional concerns.  Of note patient states that he had a small splinter on the medial aspect of his left thumbnail that was removed approximately 1 week ago.  No swelling, erythema or drainage from this area.  HPI  Past Medical History:  Diagnosis Date  . Acute diastolic heart failure ([place])   . CHF (congestive heart failure) ([place])   . Diabetes mellitus with nephropathy ([place]) 05/12/2012   Dr. Justin Mend follows  . Hemorrhoids 05/12/2012  . Hyperlipidemia   . Hypertension   . Lumbar spinal stenosis 05/03/2012   Mild with only right L4 nerve root encroachment, no neurogenic claudication   . Lumbar spondylosis 05/03/2012  . Meralgia paraesthetica 05/03/2012  . Meralgia paraesthetica 05/03/2012   On Lyrica which does improve pain.   Marland Kitchen Neuropathy in diabetes ([place]) 05/12/2012  . Obesity   . Pneumonia   . Retinopathy   . Sleep apnea    uses cpap  . Substance abuse ([place])   . Urinary incontinence 05/12/2012   Since the start of Sept. 2013     Patient Active Problem List   Diagnosis Date Noted  . Inflammatory arthropathy 09/17/2018  . Hyperparathyroidism  ([place]) 08/18/2018  . Primary insomnia 08/18/2018  . Chronic pain syndrome 08/18/2018  . CKD stage 5 due to type 2 diabetes mellitus ([place]) 08/18/2018  . COPD (chronic obstructive pulmonary disease) ([place]) 02/10/2017  . Chronic kidney disease 01/06/2016  . CKD (chronic kidney disease), stage III ([place])   . Type 2 diabetes, uncontrolled, with neuropathy ([place])   . Marijuana abuse   . Tobacco abuse   . Essential hypertension, malignant   . Chronic kidney disease, stage IV (severe) ([place])   . Legally blind 10/13/2015  . Cocaine use disorder, moderate, dependence ([place]) 07/02/2015  . Accelerated hypertension   . Uncontrolled type 2 diabetes mellitus with ketoacidosis without coma ([place])   . Cocaine abuse ([place])   . Hypertensive emergency 06/06/2015  . Chronic respiratory failure ([place])   . Pulmonary nodule 05/22/2015  . Morbid obesity with BMI of 40.0-44.9, adult ([place]) 03/26/2015  . Vision loss, bilateral 02/22/2015  . Nicotine dependence, cigarettes, with other nicotine-induced disorders 02/22/2015  . Thrombocytopenia ([place]) 12/04/2013  . Diastolic heart failure secondary to hypertension ([place]) 11/30/2013  . Radiculopathy with lower extremity symptoms 11/28/2013  . Depression 06/16/2013  . Chronic recurrent major depressive disorder ([place]) 06/16/2013  . Proliferative diabetic retinopathy ([place]) 06/08/2013  . Diabetic macular edema of left eye, with cataract, associated with type 2 diabetes mellitus ([place]) 06/08/2013  . Meibomianitis 06/08/2013  . Erectile dysfunction associated with type 2 diabetes mellitus ([place])  04/22/2013  . Decreased peripheral vision of left eye 04/12/2013  . Plantar fasciitis of right foot 04/12/2013  . Obstructive sleep apnea 02/02/2013  . Diabetic peripheral neuropathy associated with type 2 diabetes mellitus ([place]) 12/27/2012  . Abnormality of gait 12/06/2012  . Family history of malignant neoplasm of gastrointestinal tract 10/10/2012  . Personal history of colonic polyps  10/10/2012  . Skin tag 08/18/2012  . Chronic low back pain 07/15/2012  . Preventative health care 06/02/2012  . Hyperlipemia 05/12/2012  . Hypertension 05/12/2012  . Hemorrhoids 05/12/2012  . Blood per rectum 05/12/2012  . Meralgia paraesthetica 05/03/2012  . Lumbar spondylosis 05/03/2012  . Lumbar spinal stenosis 05/03/2012  . Insulin dependent type 2 diabetes mellitus, uncontrolled ([place]) 05/13/1999    Past Surgical History:  Procedure Laterality Date  . ABDOMINAL SURGERY     Abscess I&D 2/2 infected hair  . AV FISTULA PLACEMENT Left 02/06/2016   Procedure: LEFT ARM RADIOCEPHALIC ARTERIOVENOUS (AV) FISTULA CREATION;  Surgeon: Rosetta Posner, MD;  Location: [facility];  Service: Vascular;  Laterality: Left;  . COLONOSCOPY W/ POLYPECTOMY     pt to bring records  . COLONOSCOPY WITH PROPOFOL N/A 06/05/2016   Procedure: COLONOSCOPY WITH PROPOFOL;  Surgeon: Doran Stabler, MD;  Location: [facility];  Service: Gastroenterology;  Laterality: N/A;  . TONSILLECTOMY          Home Medications    Prior to Admission medications   Medication Sig Start Date End Date Taking? Authorizing Provider  aspirin EC 81 MG tablet Take 1 tablet (81 mg total) by mouth daily. 02/08/17  Yes Fay Records, MD  atorvastatin (LIPITOR) 80 MG tablet Take 1 tablet (80 mg total) by mouth daily at 6 PM. Patient taking differently: Take 80 mg by mouth daily.  08/18/18  Yes Forrest Moron, MD  carvedilol (COREG) 25 MG tablet Take 1 tablet (25 mg total) by mouth 2 (two) times daily. 08/18/18  Yes Stallings, Zoe A, MD  cetirizine (ZYRTEC) 10 MG tablet TAKE ONE TABLET BY MOUTH ONCE DAILY FOR ALLERGIES Patient taking differently: Take 10 mg by mouth daily. TAKE ONE TABLET BY MOUTH ONCE DAILY FOR ALLERGIES 08/18/18  Yes Stallings, Zoe A, MD  clobetasol cream (TEMOVATE) 0.05 % APPLY TO AFFECED ARES(S) TOPICALLY TWICE DAILY Patient taking differently: Apply 1 application topically 2 (two) times daily. APPLY TO AFFECED ARES(S)  TOPICALLY TWICE DAILY 08/18/18  Yes Stallings, Zoe A, MD  ferrous sulfate 325 (65 FE) MG tablet Take 1 tablet (325 mg total) by mouth daily. 08/18/18  Yes Stallings, Zoe A, MD  fluticasone (FLONASE) 50 MCG/ACT nasal spray USE TWO SPRAYS INTO EACH NOSTRILS DAILY Patient taking differently: Place 2 sprays into both nostrils daily. USE TWO SPRAYS INTO EACH NOSTRILS DAILY 08/18/18  Yes Stallings, Zoe A, MD  furosemide (LASIX) 80 MG tablet Take 80-160 mg by mouth 2 (two) times daily. 2 Tablets in the AM and 1 Tablet in the PM    Yes [provider]  gabapentin (NEURONTIN) 300 MG capsule Take 300 mg by mouth at bedtime.   Yes [provider]  hydrALAZINE (APRESOLINE) 100 MG tablet TAKE ONE TABLET BY MOUTH THREE TIMES A DAY. Patient taking differently: Take 100 mg by mouth 3 (three) times daily.  07/21/18  Yes Stallings, Zoe A, MD  Insulin Glargine (LANTUS SOLOSTAR) 100 UNIT/ML Solostar Pen Inject 30 Units into the skin daily. INJECT 30 UNITS UNDER THE SKIN EVERY MORNING AND INJECT 15 UNITS EVERY NIGHT Patient taking differently:  Inject 35 Units into the skin daily.  08/22/18  Yes Shamleffer, Melanie Crazier, MD  insulin lispro (HUMALOG KWIKPEN) 100 UNIT/ML KwikPen Inject 0.1 mLs (10 Units total) into the skin 3 (three) times daily. 08/22/18  Yes Shamleffer, Melanie Crazier, MD  Melatonin 5 MG TABS Take 1 tablet (5 mg total) by mouth at bedtime. 08/18/18  Yes Stallings, Zoe A, MD  metolazone (ZAROXOLYN) 5 MG tablet Take 1 tablet (5 mg total) by mouth daily. 06/13/17  Yes Forrest Moron, MD  Potassium Chloride ER 20 MEQ TBCR Take 20 mEq by mouth 2 (two) times daily. 10/08/17  Yes Stallings, Zoe A, MD  verapamil (VERELAN PM) 180 MG 24 hr capsule Take 1 capsule (180 mg total) by mouth at bedtime. Patient taking differently: Take 360 mg by mouth at bedtime.  08/18/18  Yes Stallings, Zoe A, MD  ACCU-CHEK AVIVA PLUS test strip USE AS DIRECTED TO CHECK BLOOD SUGAR 5 TIMES A DAY BEFORE MEALS AND  AS NEEDED. 09/16/18   Forrest Moron, MD  ACCU-CHEK SOFTCLIX LANCETS lancets USE AS DIRECTED TO CHECK BLOOD SUGAR 5 TIMES A DAY BEFORE MEALS AND AS NEEDED. 08/30/18   Delia Chimes A, MD  Insulin Pen Needle (B-D UF III MINI PEN NEEDLES) 31G X 5 MM MISC Four times daily 08/22/18   Shamleffer, Melanie Crazier, MD  potassium chloride SA (K-DUR,KLOR-CON) 20 MEQ tablet TAKE ONE TABLET BY MOUTH TWICE DAILY Patient not taking: No sig reported 05/06/18   Forrest Moron, MD    Family History Family History  Problem Relation Age of Onset  . Prostate cancer Father   . Alcohol abuse Father   . Emphysema Father        smoked  . Colon cancer Neg Hx     Social History Social History   Tobacco Use  . Smoking status: Current Every Day Smoker    Packs/day: 0.50    Years: 30.00    Pack years: 15.00    Types: Cigarettes  . Smokeless tobacco: Never Used  . Tobacco comment: "Trying - hard to stop"  Substance Use Topics  . Alcohol use: No    Comment: " about 40 ounce beer per month."  down to a beer every 3 weeks  . Drug use: No    Types: Marijuana, Cocaine    Comment: marijuana "laced with something"; today, stated "no" to question of illegal drug use 06/16/2016     Allergies   Patient has no known allergies.   Review of Systems Review of Systems  Constitutional: Negative.  Negative for chills and fever.  Respiratory: Negative.  Negative for cough and shortness of breath.   Cardiovascular: Negative.  Negative for chest pain.  Gastrointestinal: Negative.  Negative for abdominal pain, nausea and vomiting.  Genitourinary: Negative.  Negative for dysuria and hematuria.  Musculoskeletal: Positive for arthralgias and joint swelling. Negative for neck pain.  Skin: Positive for color change.  Neurological: Negative.  Negative for dizziness, weakness, numbness and headaches.  All other systems reviewed and are negative.  Physical Exam Updated Vital Signs BP (!) 142/84 (BP Location: Right  Arm)   Pulse 62   Temp 98.3 F (36.8 C) (Oral)   Resp 18   Ht 6' (1.829 m)   Wt 113.9 kg   SpO2 98%   BMI 34.04 kg/m   Physical Exam Constitutional:      General: He is not in acute distress.    Appearance: He is well-developed. He is obese.  HENT:  Head: Normocephalic and atraumatic.     Right Ear: External ear normal.     Left Ear: External ear normal.     Nose: Nose normal.  Eyes:     Pupils: Pupils are equal, round, and reactive to light.  Neck:     Musculoskeletal: Normal range of motion and neck supple.     Trachea: Trachea normal. No tracheal deviation.  Cardiovascular:     Rate and Rhythm: Normal rate and regular rhythm.     Pulses: Normal pulses.     Heart sounds: Normal heart sounds.  Pulmonary:     Effort: Pulmonary effort is normal. No respiratory distress.  Abdominal:     Palpations: Abdomen is soft.     Tenderness: There is no abdominal tenderness. There is no guarding or rebound.  Musculoskeletal: Normal range of motion.     Comments: Fistula present of the left upper extremity.  Blood flow present.  Left left wrist/proximal hand diffusely swollen, acutely tender with mild erythema present.  Patient with multiple small abrasions present to hand including to what appear to be puncture wounds on left palm, patient reports that these are due to his psoriasis he denies puncture wound, injury or foreign body of these areas.  He states that he scratches these areas consistently due to pruritus and has a son for many years.  No snuffbox tenderness to palpation, no tenderness to palpation of the flexor sheath.  All movements of the fingers are intact with somewhat decreased strength secondary to pain.  Patient is unable to actively move the wrist without severe pain. Sensation intact in all distributions.  Capillary refill intact to all fingers.  Skin:    General: Skin is warm and dry.     Capillary Refill: Capillary refill takes less than 2 seconds.  Neurological:       Mental Status: He is alert.     GCS: GCS eye subscore is 4. GCS verbal subscore is 5. GCS motor subscore is 6.  Psychiatric:        Behavior: Behavior normal.    ED Treatments / Results  Labs (all labs ordered are listed, but only abnormal results are displayed) Labs Reviewed  CBC WITH DIFFERENTIAL/PLATELET - Abnormal; Notable for the following components:      Result Value   Hemoglobin 12.3 (*)    All other components within normal limits  BASIC METABOLIC PANEL - Abnormal; Notable for the following components:   Potassium 3.4 (*)    Chloride 95 (*)    Glucose, Bld 354 (*)    BUN 101 (*)    Creatinine, Ser 8.28 (*)    GFR calc non Af Amer 7 (*)    GFR calc Af Amer 8 (*)    Anion gap 19 (*)    All other components within normal limits  SEDIMENTATION RATE - Abnormal; Notable for the following components:   Sed Rate 56 (*)    All other components within normal limits  C-REACTIVE PROTEIN - Abnormal; Notable for the following components:   CRP 2.1 (*)    All other components within normal limits  SYNOVIAL CELL COUNT + DIFF, W/ CRYSTALS - Abnormal; Notable for the following components:   Color, Synovial AMBER (*)    Appearance-Synovial TURBID (*)    WBC, Synovial 3,190 (*)    Neutrophil, Synovial 71 (*)    Monocyte-Macrophage-Synovial Fluid 22 (*)    All other components within normal limits  CBG MONITORING, ED - Abnormal; Notable for the  following components:   Glucose-Capillary 314 (*)    All other components within normal limits  CBG MONITORING, ED - Abnormal; Notable for the following components:   Glucose-Capillary 43 (*)    All other components within normal limits  CBG MONITORING, ED - Abnormal; Notable for the following components:   Glucose-Capillary 55 (*)    All other components within normal limits  BODY FLUID CULTURE  CULTURE, BLOOD (ROUTINE X 2)  CULTURE, BLOOD (ROUTINE X 2)  BODY FLUID CULTURE  URIC ACID  HIV ANTIBODY (ROUTINE TESTING W REFLEX)   HEMOGLOBIN A1C    EKG None  Radiology Dg Wrist Complete Left  Result Date: 09/17/2018 CLINICAL DATA:  Pain and swelling for several days EXAM: LEFT WRIST - COMPLETE 3+ VIEW COMPARISON:  None. FINDINGS: Frontal, oblique, lateral, and ulnar deviation scaphoid images were obtained. There is postoperative change lateral to the distal radius with soft tissue swelling in this area. There is no acute fracture or dislocation. Joint spaces appear normal. No erosive change. Benign cystic changes noted in the proximal triquetrum bone. IMPRESSION: Postoperative change with soft tissue swelling in area of reported fistula lateral to the left radius. Associated mass in this area can not be excluded. There is no soft tissue air. No fracture or dislocation. No appreciable joint space narrowing or erosion. Electronically Signed   By: Lowella Grip III M.D.   On: 09/17/2018 08:49   US Guided Needle Placement  Result Date: 09/17/2018 INDICATION: 53 year old with left wrist swelling and pain. Concern for septic joint. Request for image guided wrist aspiration. EXAM: ULTRASOUND-GUIDED LEFT WRIST ASPIRATION MEDICATIONS: None ANESTHESIA/SEDATION: None COMPLICATIONS: None immediate. PROCEDURE: Informed written consent was obtained from the patient after a thorough discussion of the procedural risks, benefits and alternatives. All questions were addressed. A timeout was performed prior to the initiation of the procedure. Dorsal aspect of the wrist was examined with ultrasound. Small amount of fluid along the radiocarpal joint was identified. The dorsal aspect of the wrist was shaved. The skin was prepped with chlorhexidine and sterile field was created. Skin was anesthetized with 1% lidocaine. A 22 gauge needle was directed into the dorsal aspect of the radiocarpal joint with ultrasound guidance. Procedure was very uncomfortable for the patient. Approximately 1 mL of clear yellowish fluid was aspirated. Needle was removed  without complication. Bandage placed over the puncture site. FINDINGS: Small amount of fluid along the dorsal aspect of the wrist joint. 1 mL of clear yellowish fluid was aspirated. IMPRESSION: Successful ultrasound-guided aspiration of the left wrist joint. Fluid was sent for analysis. Electronically Signed   By: Markus Daft M.D.   On: 09/17/2018 13:49   Dg Hand Complete Left  Result Date: 09/17/2018 CLINICAL DATA:  Pain and swelling EXAM: LEFT HAND - COMPLETE 3+ VIEW COMPARISON:  Left wrist radiographs September 17, 2018 FINDINGS: Frontal, oblique, and lateral views were obtained. Postoperative change with soft tissue swelling medial to the distal radius is better seen on wrist images. There is no acute fracture or dislocation. There are small erosions in the distal third and second proximal phalangeal regions at the PIP joint levels. There is slight narrowing of all DIP joints. IMPRESSION: 1. Postoperative changes lateral to the distal radius or better appreciated on wrist views. 2. Subtle erosions in the second and third PIP joints. Mild narrowing of all DIP joints. 3.  No fracture or dislocation. Electronically Signed   By: Lowella Grip III M.D.   On: 09/17/2018 08:51    Procedures  Procedures (including critical care time)  Medications Ordered in ED Medications  lidocaine (PF) (XYLOCAINE) 1 % injection (has no administration in time range)  lidocaine (PF) (XYLOCAINE) 1 % injection (has no administration in time range)  vancomycin (VANCOCIN) 2,000 mg in sodium chloride 0.9 % 500 mL IVPB (has no administration in time range)  cefTRIAXone (ROCEPHIN) 2 g in sodium chloride 0.9 % 100 mL IVPB (0 g Intravenous Stopped 09/17/18 1616)  dextrose 50 % solution 50 mL (has no administration in time range)  atorvastatin (LIPITOR) tablet 80 mg (has no administration in time range)  carvedilol (COREG) tablet 25 mg (has no administration in time range)  hydrALAZINE (APRESOLINE) tablet 100 mg (has no  administration in time range)  verapamil (VERELAN PM) 24 hr capsule 180 mg (has no administration in time range)  ferrous sulfate tablet 325 mg (has no administration in time range)  Melatonin TABS 5 mg (has no administration in time range)  gabapentin (NEURONTIN) capsule 300 mg (has no administration in time range)  loratadine (CLARITIN) tablet 10 mg (has no administration in time range)  fluticasone (FLONASE) 50 MCG/ACT nasal spray 2 spray (has no administration in time range)  heparin injection 5,000 Units (has no administration in time range)  acetaminophen (TYLENOL) tablet 650 mg (has no administration in time range)    Or  acetaminophen (TYLENOL) suppository 650 mg (has no administration in time range)  ondansetron (ZOFRAN) tablet 4 mg (has no administration in time range)    Or  ondansetron (ZOFRAN) injection 4 mg (has no administration in time range)  insulin aspart (novoLOG) injection 0-9 Units (has no administration in time range)  insulin aspart (novoLOG) injection 0-5 Units (has no administration in time range)  nicotine (NICODERM CQ - dosed in mg/24 hours) patch 21 mg (has no administration in time range)  morphine 4 MG/ML injection 4 mg (4 mg Intravenous Given 09/17/18 0850)  morphine 2 MG/ML injection 2 mg (2 mg Intravenous Given 09/17/18 1023)  insulin aspart (novoLOG) injection 10 Units (10 Units Subcutaneous Given 09/17/18 1206)  morphine 4 MG/ML injection 4 mg (4 mg Intravenous Given 09/17/18 1357)  dextrose 50 % solution (  Given 09/17/18 1632)     Initial Impression / Assessment and Plan / ED Course  I have reviewed the triage vital signs and the nursing notes.  Pertinent labs & imaging results that were available during my care of the patient were reviewed by me and considered in my medical decision making (see chart for details).     53 year old male with multiple medical conditions including stage V CKD and diabetes presenting for atraumatic erythema, pain and  swelling of the left wrist.  Clinically concern for septic joint at this time.  Elevated ESR and CRP; with uric acid that is within normal limits.   --------------- 11:35 AM: Consult called with hand surgeon Dr. Grandville Silos who is coming in to see patient here in the emergency department.  Request for patient to be moved into a room and have C arm available, nursing staff made aware and are setting this up. ----------  11:43 AM: Received call from Dr. Grandville Silos, interventional radiology will be performing joint aspiration.  Plan to reconsult if organisms are seen. ----------- 11:50 AM: Discussed case with interventional radiologist who will see patient for joint aspiration. -------- Patient returned from interventional radiology, pain controlled, patient resting comfortably in no acute distress.  Begun antibiotics, vancomycin, Rocephin. -------- Joint aspirated, fluid analysis is not suggestive of septic arthritis. Rediscussed case  with Dr. Zenia Resides, will admit patient for treatment of his cellulitis. Patient updated on care plan and is agreeable. ---------- Consult called to admitting physician who will see patient in emergency department for admission and further treatment of his cellulitis. ------- After consult I was informed by nursing staff that patient was complaining of low blood sugar levels.  CBG reveals glucose level of 43.  Patient being given orange juice and carbohydrate snacks by nursing staff. --------- 4:18 PM, patient reassessed, sitting up eating snacks resting comfortably.  He is currently speaking to admitting team.  No acute distress. -------------- Repeat CBG 55, amp of D50 ordered. --------------- CBG is now 143, patient resting comfortably talking on cell phone with family member.  NAD. ------------- Patient has been admitted for further evaluation and treatment.  Patient was seen and evaluated by Dr. Zenia Resides during this visit.  Note: Portions of this report may have been  transcribed using voice recognition software. Every effort was made to ensure accuracy; however, inadvertent computerized transcription errors may still be present. Final Clinical Impressions(s) / ED Diagnoses   Final diagnoses:  Arthritis  Inflammatory monoarthritis of wrist, left  Pain  Cellulitis of left upper extremity    ED Discharge Orders    None       Gari Crown 09/17/18 1714    Lacretia Leigh, MD 09/18/18 [phone]

## 2018-09-17 NOTE — Progress Notes (Signed)
Wrist aspiration results turbid fluid, 3K WBC, Gram Stain +WBC and no organisms.  Culture pending.  Will continue to follow cultures.  At this point, low level of suspicion for septic wrist, but cultures are pending.  Should probably be admitted and started on therapy for BOTH infectious and non-infectious inflammatory etiologies and follow clinical response.  I remain available as may be needed to address surgical concerns as they may arise.  Micheline Rough, MD Hand Surgery

## 2018-09-17 NOTE — ED Triage Notes (Signed)
Pt arrives with reports of left hand swelling and pain x5 days. Pt had fistula placed about 2 years ago but states they have not started using it yet.

## 2018-09-17 NOTE — Progress Notes (Signed)
Pt asked RT to bring a CPAP and have it set up for pt to place on himself when ready for bed. RT will continue to monitor as needed.

## 2018-09-17 NOTE — Consult Note (Addendum)
ORTHOPAEDIC CONSULTATION HISTORY & PHYSICAL REQUESTING PHYSICIAN: Lacretia Leigh, MD  Chief Complaint: left wrist pain/swelling  HPI: Jerry Cantrell is a 53 y.o. male who presented to the ED with 5-day history of developing left wrist region pain and swelling, without associated trauma.  He denies fevers or chills.  PMH significant for IDDM with nephropathy (stage 5 CKD) and CHF.  No known history of inflammatory or autoimmune diseases, although he reports that he has intermittently periods of time when his left knee is painful..  Presently undergoing radiocarpal fluoroscopically guided aspiration for diagnostic purposes, which I recommended earlier today via telephone to treating ED PA.  ESR 56, CRP 2.1, but WBC only 8.8  Past Medical History:  Diagnosis Date  . Acute diastolic heart failure ([place])   . CHF (congestive heart failure) ([place])   . Diabetes mellitus with nephropathy ([place]) 05/12/2012   Dr. Justin Mend follows  . Hemorrhoids 05/12/2012  . Hyperlipidemia   . Hypertension   . Lumbar spinal stenosis 05/03/2012   Mild with only right L4 nerve root encroachment, no neurogenic claudication   . Lumbar spondylosis 05/03/2012  . Meralgia paraesthetica 05/03/2012  . Meralgia paraesthetica 05/03/2012   On Lyrica which does improve pain.   Marland Kitchen Neuropathy in diabetes ([place]) 05/12/2012  . Obesity   . Pneumonia   . Retinopathy   . Sleep apnea    uses cpap  . Substance abuse ([place])   . Urinary incontinence 05/12/2012   Since the start of Sept. 2013    Past Surgical History:  Procedure Laterality Date  . ABDOMINAL SURGERY     Abscess I&D 2/2 infected hair  . AV FISTULA PLACEMENT Left 02/06/2016   Procedure: LEFT ARM RADIOCEPHALIC ARTERIOVENOUS (AV) FISTULA CREATION;  Surgeon: Rosetta Posner, MD;  Location: [facility];  Service: Vascular;  Laterality: Left;  . COLONOSCOPY W/ POLYPECTOMY     pt to bring records  . COLONOSCOPY WITH PROPOFOL N/A 06/05/2016   Procedure: COLONOSCOPY WITH PROPOFOL;  Surgeon: Doran Stabler, MD;  Location: [facility];  Service: Gastroenterology;  Laterality: N/A;  . TONSILLECTOMY     Social History   Socioeconomic History  . Marital status: Single    Spouse name: Not on file  . Number of children: Not on file  . Years of education: Not on file  . Highest education level: Not on file  Occupational History  . Occupation: dietary services    Employer: [employer]  Social Needs  . Financial resource strain: Not on file  . Food insecurity:    Worry: Not on file    Inability: Not on file  . Transportation needs:    Medical: Not on file    Non-medical: Not on file  Tobacco Use  . Smoking status: Current Every Day Smoker    Packs/day: 0.50    Years: 30.00    Pack years: 15.00    Types: Cigarettes  . Smokeless tobacco: Never Used  . Tobacco comment: "Trying - hard to stop"  Substance and Sexual Activity  . Alcohol use: No    Comment: " about 40 ounce beer per month."  down to a beer every 3 weeks  . Drug use: No    Types: Marijuana, Cocaine    Comment: marijuana "laced with something"; today, stated "no" to question of illegal drug use 06/16/2016  . Sexual activity: Not on file  Lifestyle  . Physical activity:    Days per week: Not on file    Minutes per session:  Not on file  . Stress: Not on file  Relationships  . Social connections:    Talks on phone: Not on file    Gets together: Not on file    Attends religious service: Not on file    Active member of club or organization: Not on file    Attends meetings of clubs or organizations: Not on file    Relationship status: Not on file  Other Topics Concern  . Not on file  Social History Narrative  . Not on file   Family History  Problem Relation Age of Onset  . Prostate cancer Father   . Alcohol abuse Father   . Emphysema Father        smoked  . Colon cancer Neg Hx    No Known Allergies Prior to Admission medications   Medication Sig Start Date End Date Taking? Authorizing Provider  aspirin EC  81 MG tablet Take 1 tablet (81 mg total) by mouth daily. 02/08/17  Yes Fay Records, MD  atorvastatin (LIPITOR) 80 MG tablet Take 1 tablet (80 mg total) by mouth daily at 6 PM. Patient taking differently: Take 80 mg by mouth daily.  08/18/18  Yes Forrest Moron, MD  carvedilol (COREG) 25 MG tablet Take 1 tablet (25 mg total) by mouth 2 (two) times daily. 08/18/18  Yes Stallings, Zoe A, MD  cetirizine (ZYRTEC) 10 MG tablet TAKE ONE TABLET BY MOUTH ONCE DAILY FOR ALLERGIES Patient taking differently: Take 10 mg by mouth daily. TAKE ONE TABLET BY MOUTH ONCE DAILY FOR ALLERGIES 08/18/18  Yes Stallings, Zoe A, MD  clobetasol cream (TEMOVATE) 0.05 % APPLY TO AFFECED ARES(S) TOPICALLY TWICE DAILY Patient taking differently: Apply 1 application topically 2 (two) times daily. APPLY TO AFFECED ARES(S) TOPICALLY TWICE DAILY 08/18/18  Yes Stallings, Zoe A, MD  ferrous sulfate 325 (65 FE) MG tablet Take 1 tablet (325 mg total) by mouth daily. 08/18/18  Yes Stallings, Zoe A, MD  fluticasone (FLONASE) 50 MCG/ACT nasal spray USE TWO SPRAYS INTO EACH NOSTRILS DAILY Patient taking differently: Place 2 sprays into both nostrils daily. USE TWO SPRAYS INTO EACH NOSTRILS DAILY 08/18/18  Yes Stallings, Zoe A, MD  furosemide (LASIX) 80 MG tablet Take 80-160 mg by mouth 2 (two) times daily. 2 Tablets in the AM and 1 Tablet in the PM    Yes [provider]  gabapentin (NEURONTIN) 300 MG capsule Take 300 mg by mouth at bedtime.   Yes [provider]  hydrALAZINE (APRESOLINE) 100 MG tablet TAKE ONE TABLET BY MOUTH THREE TIMES A DAY. Patient taking differently: Take 100 mg by mouth 3 (three) times daily.  07/21/18  Yes Stallings, Zoe A, MD  Insulin Glargine (LANTUS SOLOSTAR) 100 UNIT/ML Solostar Pen Inject 30 Units into the skin daily. INJECT 30 UNITS UNDER THE SKIN EVERY MORNING AND INJECT 15 UNITS EVERY NIGHT Patient taking differently: Inject 35 Units into the skin daily.  08/22/18  Yes Shamleffer, Melanie Crazier, MD  insulin lispro (HUMALOG KWIKPEN) 100 UNIT/ML KwikPen Inject 0.1 mLs (10 Units total) into the skin 3 (three) times daily. 08/22/18  Yes Shamleffer, Melanie Crazier, MD  Melatonin 5 MG TABS Take 1 tablet (5 mg total) by mouth at bedtime. 08/18/18  Yes Stallings, Zoe A, MD  metolazone (ZAROXOLYN) 5 MG tablet Take 1 tablet (5 mg total) by mouth daily. 06/13/17  Yes Forrest Moron, MD  Potassium Chloride ER 20 MEQ TBCR Take 20 mEq by mouth 2 (two) times daily. 10/08/17  Yes Stallings, Zoe A, MD  verapamil (VERELAN PM) 180 MG 24 hr capsule Take 1 capsule (180 mg total) by mouth at bedtime. Patient taking differently: Take 360 mg by mouth at bedtime.  08/18/18  Yes Stallings, Zoe A, MD  ACCU-CHEK AVIVA PLUS test strip USE AS DIRECTED TO CHECK BLOOD SUGAR 5 TIMES A DAY BEFORE MEALS AND AS NEEDED. 09/16/18   Forrest Moron, MD  ACCU-CHEK SOFTCLIX LANCETS lancets USE AS DIRECTED TO CHECK BLOOD SUGAR 5 TIMES A DAY BEFORE MEALS AND AS NEEDED. 08/30/18   Delia Chimes A, MD  Insulin Pen Needle (B-D UF III MINI PEN NEEDLES) 31G X 5 MM MISC Four times daily 08/22/18   Shamleffer, Melanie Crazier, MD  potassium chloride SA (K-DUR,KLOR-CON) 20 MEQ tablet TAKE ONE TABLET BY MOUTH TWICE DAILY Patient not taking: No sig reported 05/06/18   Forrest Moron, MD   Dg Wrist Complete Left  Result Date: 09/17/2018 CLINICAL DATA:  Pain and swelling for several days EXAM: LEFT WRIST - COMPLETE 3+ VIEW COMPARISON:  None. FINDINGS: Frontal, oblique, lateral, and ulnar deviation scaphoid images were obtained. There is postoperative change lateral to the distal radius with soft tissue swelling in this area. There is no acute fracture or dislocation. Joint spaces appear normal. No erosive change. Benign cystic changes noted in the proximal triquetrum bone. IMPRESSION: Postoperative change with soft tissue swelling in area of reported fistula lateral to the left radius. Associated mass in this area can not be excluded.  There is no soft tissue air. No fracture or dislocation. No appreciable joint space narrowing or erosion. Electronically Signed   By: Lowella Grip III M.D.   On: 09/17/2018 08:49   Dg Hand Complete Left  Result Date: 09/17/2018 CLINICAL DATA:  Pain and swelling EXAM: LEFT HAND - COMPLETE 3+ VIEW COMPARISON:  Left wrist radiographs September 17, 2018 FINDINGS: Frontal, oblique, and lateral views were obtained. Postoperative change with soft tissue swelling medial to the distal radius is better seen on wrist images. There is no acute fracture or dislocation. There are small erosions in the distal third and second proximal phalangeal regions at the PIP joint levels. There is slight narrowing of all DIP joints. IMPRESSION: 1. Postoperative changes lateral to the distal radius or better appreciated on wrist views. 2. Subtle erosions in the second and third PIP joints. Mild narrowing of all DIP joints. 3.  No fracture or dislocation. Electronically Signed   By: Lowella Grip III M.D.   On: 09/17/2018 08:51    Positive ROS: All other systems have been reviewed and were otherwise negative with the exception of those mentioned in the HPI and as above.  Physical Exam: Vitals: Refer to EMR. Constitutional:  WD, WN, NAD HEENT:  NCAT, EOMI Neuro/Psych:  Alert & oriented to person, place, and time; appropriate mood & affect Lymphatic: No generalized extremity edema or lymphadenopathy Extremities / MSK:  The extremities are normal with respect to appearance, ranges of motion, joint stability, muscle strength/tone, sensation, & perfusion except as otherwise noted:  On examination, there is a Band-Aid on the dorsal aspect of the wrist, overlying the expected approach for radiocarpal aspiration.  There is mild subcutaneous edema overlying the dorsum of the hand, extending through to the distal aspect of the forearm, with mild warmth.  It is not localized simply at the radiocarpal joint.  There is tenderness to  palpation along the extensor apparatus, both proximal and distal to the actual location of the radiocarpal joint.  His digits are held in extension.  He can fully extend the digits, but has slight limitation in flexion causing increased pain.  When the digits are supported by my hand in a neutral position, and flexion and extension is isolated to the wrist joint, pain is less provoked then when extending the digits against resistance while the wrist is held supported in neutral.  Assessment: Inflammation of the left hand and wrist region.  Etiology unclear, infectious versus noninfectious.  The degree of inflammation appears broader than is typically associated with septic arthritis.  Without a direct inoculation via puncture wound or other portal of entry, genesis would be assumed to be hematogenous seeding of the joint, most often limiting the pain and inflammation to movement that causes wrist capsule irritation, not so much the overlying extensor tendon apparatus.  Nonetheless, infectious etiology remains possible.  Plan: I discussed these findings with him, and the role in his care that I can occupy as a Copy.   Please keep n.p.o. awaiting the results of the analysis of the aspiration, which was 81m of clear/yellowish fluid.  I will f/u on the results and resume diet once satisfied that no immediate operative intervention is indicated.  DRayvon CharTGrandville Silos MOldsmarGElgin [place]  [id number]: [phone]: [phone] 09/17/2018, 1:23 PM

## 2018-09-18 ENCOUNTER — Other Ambulatory Visit: Payer: Self-pay

## 2018-09-18 ENCOUNTER — Encounter (HOSPITAL_COMMUNITY): Payer: Self-pay

## 2018-09-18 DIAGNOSIS — M13132 Monoarthritis, not elsewhere classified, left wrist: Secondary | ICD-10-CM | POA: Diagnosis present

## 2018-09-18 DIAGNOSIS — L03114 Cellulitis of left upper limb: Secondary | ICD-10-CM | POA: Diagnosis present

## 2018-09-18 LAB — CBC
HCT: 37.8 % — ABNORMAL LOW (ref 39.0–52.0)
Hemoglobin: 11.7 g/dL — ABNORMAL LOW (ref 13.0–17.0)
MCH: 28.8 pg (ref 26.0–34.0)
MCHC: 31 g/dL (ref 30.0–36.0)
MCV: 93.1 fL (ref 80.0–100.0)
Platelets: 149 10*3/uL — ABNORMAL LOW (ref 150–400)
RBC: 4.06 MIL/uL — ABNORMAL LOW (ref 4.22–5.81)
RDW: 12.6 % (ref 11.5–15.5)
WBC: 6.7 10*3/uL (ref 4.0–10.5)
nRBC: 0 % (ref 0.0–0.2)

## 2018-09-18 LAB — RENAL FUNCTION PANEL
Albumin: 3 g/dL — ABNORMAL LOW (ref 3.5–5.0)
Anion gap: 16 — ABNORMAL HIGH (ref 5–15)
BUN: 95 mg/dL — AB (ref 6–20)
CO2: 27 mmol/L (ref 22–32)
Calcium: 8.8 mg/dL — ABNORMAL LOW (ref 8.9–10.3)
Chloride: 99 mmol/L (ref 98–111)
Creatinine, Ser: 8.17 mg/dL — ABNORMAL HIGH (ref 0.61–1.24)
GFR calc Af Amer: 8 mL/min — ABNORMAL LOW (ref >60)
GFR calc non Af Amer: 7 mL/min — ABNORMAL LOW (ref >60)
Glucose, Bld: 273 mg/dL — ABNORMAL HIGH (ref 70–99)
Phosphorus: 8.2 mg/dL — ABNORMAL HIGH (ref 2.5–4.6)
Potassium: 3.3 mmol/L — ABNORMAL LOW (ref 3.5–5.1)
SODIUM: 142 mmol/L (ref 135–145)

## 2018-09-18 LAB — GLUCOSE, CAPILLARY
Glucose-Capillary: 250 mg/dL — ABNORMAL HIGH (ref 70–99)
Glucose-Capillary: 291 mg/dL — ABNORMAL HIGH (ref 70–99)
Glucose-Capillary: 332 mg/dL — ABNORMAL HIGH (ref 70–99)
Glucose-Capillary: 345 mg/dL — ABNORMAL HIGH (ref 70–99)
Glucose-Capillary: 366 mg/dL — ABNORMAL HIGH (ref 70–99)

## 2018-09-18 LAB — HIV ANTIBODY (ROUTINE TESTING W REFLEX): HIV Screen 4th Generation wRfx: NONREACTIVE

## 2018-09-18 MED ORDER — HYDROMORPHONE HCL 2 MG PO TABS
2.0000 mg | ORAL_TABLET | Freq: Four times a day (QID) | ORAL | Status: DC | PRN
  Administered 2018-09-18 – 2018-09-20 (×5): 2 mg via ORAL
  Filled 2018-09-18 (×8): qty 1

## 2018-09-18 MED ORDER — CLOBETASOL PROPIONATE 0.05 % EX CREA
1.0000 "application " | TOPICAL_CREAM | Freq: Two times a day (BID) | CUTANEOUS | Status: DC
  Administered 2018-09-18 – 2018-09-20 (×3): 1 via TOPICAL
  Filled 2018-09-18 (×2): qty 15

## 2018-09-18 MED ORDER — HYDROMORPHONE HCL 2 MG PO TABS
1.0000 mg | ORAL_TABLET | Freq: Once | ORAL | Status: AC | PRN
  Administered 2018-09-18: 1 mg via ORAL
  Filled 2018-09-18: qty 1

## 2018-09-18 NOTE — Progress Notes (Signed)
Mdicine attending: I examined this patient today together with resident physician Dr Vilma Prader and I concur with her evaluation and management plan. Symptomatically less pain and swelling and increased mobility left wrist on antibiotics overnight. We greatly appreciate hand surgery prompt consultation and recommendations. No current indication for joint debridement surgery. We will continue ceftriaxone.  Discontinue vancomycin. Blood sugars have stabilized after transient hypoglycemia in the emergency department yesterday.  In fact, glucose rebounded to peak 354. He remains afebrile and white count remains normal at 6700. Continue current management.

## 2018-09-18 NOTE — Progress Notes (Signed)
Pt stated he did not need any help being placed on cpap when ready for bed. RT will continue to monitor as needed.

## 2018-09-18 NOTE — Progress Notes (Addendum)
   Subjective: Feeling well this AM. Able to move his hand more and the pain is not as bad. He is concerned because he lost his IV access overnight and may not have gotten the antibiotics he needs. Discussed that the hand surgeon does not feel he needs surgery. Will continue antibiotics and continue to work on pain management.   Objective: Vital signs in last 24 hours: Vitals:   09/17/18 1856 09/17/18 2131 09/18/18 0018 09/18/18 0540  BP: (!) 191/103 (!) 183/84 134/75 115/71  Pulse: 74 78 74 (!) 56  Resp: 19 20  18   Temp:  98.4 F (36.9 C)  (!) 97.5 F (36.4 C)  TempSrc:  Oral  Oral  SpO2: 100% 96%  91%  Weight:      Height:       Gen: Obese male in no acute distress Pulm: Good air movement with no wheezing or crackles  CV: RRR Ext: :Left hand erythema improved, neurovascularly intact, tenderness to palpation improved.   Assessment/Plan:  Mr. 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 He was admitted for further evaluation and workup.  Inflammatory Arthropathy - Swelling, erythema, and tenderness are improving  - Remains afebrile without leukocytosis  - Telemetry reviewed. Plan - F/u synovial fluid cultures - Continue empiric therapy with ceftriaxone. No more vancomycin needed - Pain control: Dilaudid 1mg  q6hrs PRN  DMII - No further hypoglycemia  - CBGs above goal of <180  Plan - Continue Lantus 20u daily and SSI - Continue home gabapentin 300mg  qhs  CKDV - Follows with Dr. Justin Mend. Currently undergoing transplant evaluation with [facility]. Has LUE AVF which has never been accessed. HD has not been initiated because the patient has felt well overall despite progressively worsening kidney function. - Cr is elevated from his baseline, perhaps 2/2 NSAID use at home Plan - Continue to monitor for uremic symptoms.  - Avoid  nephrotoxic medications   HTN - BP 115/71 this am Plan - Continue home coreg 25mg  BID, hydralazine 100mg  TID, verapamil 180mg  daily, and lasix 120mg  am/80mg  pm  OSA: CPAP at night HLD: Continue lipitor 80mg  daily Tobacco use: nicotine patch  Dispo: Anticipated discharge in approximately 1-2 day(s).   Ina Homes, MD 09/18/2018, 7:45 AM

## 2018-09-18 NOTE — Progress Notes (Signed)
  PATIENT ID: Jerry Cantrell  MRN: [id number]  DOB/AGE:  May 31, 1966 / 53 y.o.      Subjective: HD 2, reports feeling improvement today with less pain and improving ROM   Objective: AF, WBC not elevated Slightly less edema Passive wrist motion with digits supported is smooth, fluid, and not very pain provoking Stressing extensor tendons is also less painful  Assessment/Plan: Improving with present tx Cxs still neg, but not final No surgical indications at this time, continue present empiric tx for both etiologies.  Jerry Cantrell Jerry Cantrell, Jerry Cantrell, Jerry Cantrell  [id number] Office: [phone] Mobile: [phone]  09/18/2018, 8:44 AM

## 2018-09-19 LAB — GLUCOSE, CAPILLARY
Glucose-Capillary: 261 mg/dL — ABNORMAL HIGH (ref 70–99)
Glucose-Capillary: 314 mg/dL — ABNORMAL HIGH (ref 70–99)
Glucose-Capillary: 329 mg/dL — ABNORMAL HIGH (ref 70–99)
Glucose-Capillary: 370 mg/dL — ABNORMAL HIGH (ref 70–99)
Glucose-Capillary: 111 mg/dL — ABNORMAL HIGH (ref 70–99)
Glucose-Capillary: 307 mg/dL — ABNORMAL HIGH (ref 70–99)

## 2018-09-19 LAB — BASIC METABOLIC PANEL
Anion gap: 18 — ABNORMAL HIGH (ref 5–15)
BUN: 94 mg/dL — ABNORMAL HIGH (ref 6–20)
CALCIUM: 9.4 mg/dL (ref 8.9–10.3)
CO2: 29 mmol/L (ref 22–32)
Chloride: 94 mmol/L — ABNORMAL LOW (ref 98–111)
Creatinine, Ser: 7.99 mg/dL — ABNORMAL HIGH (ref 0.61–1.24)
GFR calc non Af Amer: 7 mL/min — ABNORMAL LOW (ref >60)
GFR, EST AFRICAN AMERICAN: 8 mL/min — AB (ref >60)
Glucose, Bld: 228 mg/dL — ABNORMAL HIGH (ref 70–99)
Potassium: 3.1 mmol/L — ABNORMAL LOW (ref 3.5–5.1)
Sodium: 141 mmol/L (ref 135–145)

## 2018-09-19 MED ORDER — FUROSEMIDE 80 MG PO TABS
80.0000 mg | ORAL_TABLET | Freq: Every evening | ORAL | Status: DC
  Administered 2018-09-19: 80 mg via ORAL
  Filled 2018-09-19: qty 1

## 2018-09-19 MED ORDER — INSULIN ASPART 100 UNIT/ML ~~LOC~~ SOLN
5.0000 [IU] | Freq: Three times a day (TID) | SUBCUTANEOUS | Status: DC
  Administered 2018-09-19 – 2018-09-20 (×4): 5 [IU] via SUBCUTANEOUS

## 2018-09-19 MED ORDER — SENNOSIDES-DOCUSATE SODIUM 8.6-50 MG PO TABS
1.0000 | ORAL_TABLET | Freq: Two times a day (BID) | ORAL | Status: DC
  Administered 2018-09-19 – 2018-09-20 (×3): 1 via ORAL
  Filled 2018-09-19 (×3): qty 1

## 2018-09-19 MED ORDER — FUROSEMIDE 80 MG PO TABS
120.0000 mg | ORAL_TABLET | Freq: Every day | ORAL | Status: DC
  Administered 2018-09-19 – 2018-09-20 (×2): 120 mg via ORAL
  Filled 2018-09-19: qty 1

## 2018-09-19 NOTE — Progress Notes (Signed)
Pt. Refused cpap for tonight. 

## 2018-09-19 NOTE — Progress Notes (Signed)
Medicine attending: Clinical status and medical database reviewed with resident physician Dr. Vilma Prader and I concur with her evaluation and management plan. Continued improvement in pain and erythema left hand and wrist.  Synovial fluid and blood cultures negative so far.  Day 2 ceftriaxone. If cultures negative at 72 hours, we will transition to oral antibiotics and discharge. No further hypoglycemic episodes, in fact, sugars have been running high.  We are reintroducing long-acting insulin. No change in CKD 5.

## 2018-09-19 NOTE — Progress Notes (Signed)
   Subjective: No overnight events. Mr. Herbers reports that he doesn't feel well this morning because his blood sugars are too high. It was explained that mealtime insulin will be started in addition to his long acting insulin. He would also like to take his lasix earlier in the day because he was up all night urinating. He would also like to weigh himself today because he has been eating more in the hospital than he does at home. His hand, however, feels much improved. All of his questions were addressed.  Objective:  Vital signs in last 24 hours: Vitals:   09/18/18 2104 09/18/18 2106 09/19/18 0000 09/19/18 0455  BP: (!) 206/99 (!) 183/89 (!) 156/78 (!) 145/89  Pulse: 72 71 69 77  Resp: 18   18  Temp: 97.8 F (36.6 C)   (!) 97.5 F (36.4 C)  TempSrc: Oral   Oral  SpO2: 98% 98%  97%  Weight:      Height:       Gen: sitting up on the side of the bed, no distress Pulm: CTAB, normal effort Ext: Left wrist and hand swelling has improved and has only mild increased warmth. His ROM has also improved. He has 5/5 grip strength and normal cap refill in the bilateral hands. LUE AVF with palpable thrill. Trace lower extremity edema.  Assessment/Plan:  Principal Problem:   Inflammatory arthropathy Active Problems:   Insulin dependent type 2 diabetes mellitus, uncontrolled (HCC)   Hypertension   Obstructive sleep apnea   CKD (chronic kidney disease), stage V (HCC)   Cellulitis of left upper extremity   Inflammatory monoarthritis of wrist, left  Mr. 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 He was admitted for further evaluation and workup.  Inflammatory Arthropathy - Swelling, erythema, and tenderness continue to improve - Remains afebrile without leukocytosis  - Synovial fluid cultures without growth x24hrs - No operative intervention  required per hand surgery Plan - F/u synovial fluid cultures - Continue empiric therapy with ceftriaxone. Plan to transition to PO tomorrow -Pain control: Dilaudid 2mg  q6hrs PRN  DMII - CBGs above goal of <180  Plan - Continue Lantus 20u daily - Add 5u Novolog TID with meals - Continue home gabapentin 300mg  qhs  CKDV - Cr stable at 7.99 today Plan - Continue to monitor for uremic symptoms.  - Avoid nephrotoxic medications   HTN - BP 145/89 this am Plan - Continue home coreg 25mg  BID, hydralazine 100mg  TID, verapamil 180mg  daily, and lasix 120mg  am/80mg  pm  OSA: CPAP at night BOF:BPZWCHEN lipitor 80mg  daily Tobacco IDP:[id number] patch  Dispo: Anticipated discharge in approximately 1 day.  Adrik Khim, Andree Elk, MD 09/19/2018, 6:49 AM Pager: [phone]

## 2018-09-19 NOTE — Progress Notes (Signed)
Inpatient Diabetes Program Recommendations  AACE/ADA: New Consensus Statement on Inpatient Glycemic Control (2015)  Target Ranges:  Prepandial:   less than 140 mg/dL      Peak postprandial:   less than 180 mg/dL (1-2 hours)      Critically ill patients:  140 - 180 mg/dL   Results for Jerry Cantrell, Jerry Cantrell (MRN [id number]) as of 09/19/2018 12:37  Ref. Range 09/18/2018 07:58 09/18/2018 11:37 09/18/2018 17:24 09/18/2018 21:01  Glucose-Capillary Latest Ref Range: 70 - 99 mg/dL 345 (H)  7 units NOVOLOG +  20 units LANTUS  366 (H)  9 units NOVOLOG  250 (H)  3 units NOVOLOG  261 (H)  3 units NOVOLOG    Results for Jerry Cantrell, Jerry Cantrell (MRN [id number]) as of 09/19/2018 12:37  Ref. Range 09/19/2018 07:26  Glucose-Capillary Latest Ref Range: 70 - 99 mg/dL 314 (H)  12 units NOVOLOG +  20 units LANTUS    Results for Jerry Cantrell, Jerry Cantrell (MRN [id number]) as of 09/19/2018 12:37  Ref. Range 08/18/2018 09:01  Hemoglobin A1C Latest Ref Range: 4.0 - 5.6 % 5.9 (A)    Admit with: Inflammatory Arthropathy L Wrist/Hand with Cellulitis  History: DM, CKD V  Home DM Meds: Lantus 35 units Daily       Humalog 10 units TID with meals  Current Orders: Lantus 20 units Daily      Novolog Sensitive Correction Scale/ SSI (0-9 units) TID AC + HS      Novolog 5 units TID with meals     Note Lantus 20 units Daily restarted yesterday AM.  Novolog 5 units TID (Meal Coverage) started this AM with Breakfast.     MD- if patient not discharged home today, please consider increasing Lantus to 35 units Daily (home dose)    --Will follow patient during hospitalization--  Wyn Quaker RN, MSN, CDE Diabetes Coordinator Inpatient Glycemic Control Team Team Pager: [phone] (8a-5p)

## 2018-09-19 NOTE — Discharge Summary (Addendum)
Name: Jerry Cantrell MRN: [id number] DOB: 04-18-1966 53 y.o. PCP: Forrest Moron, MD  Date of Admission: 09/17/2018  8:10 AM Date of Discharge: 09/20/2018 Attending Physician: Dr. Rebeca Alert  Discharge Diagnosis: 1. 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 CKDV  Discharge Medications: Allergies as of 09/20/2018   No Known Allergies     Medication List    STOP taking these medications   potassium chloride SA 20 MEQ tablet Commonly known as:  K-DUR,KLOR-CON     TAKE these medications   ACCU-CHEK AVIVA PLUS test strip Generic drug:  glucose blood USE AS DIRECTED TO CHECK BLOOD SUGAR 5 TIMES A DAY BEFORE MEALS AND AS NEEDED.   ACCU-CHEK SOFTCLIX LANCETS lancets USE AS DIRECTED TO CHECK BLOOD SUGAR 5 TIMES A DAY BEFORE MEALS AND AS NEEDED.   aspirin EC 81 MG tablet Take 1 tablet (81 mg total) by mouth daily.   atorvastatin 80 MG tablet Commonly known as:  LIPITOR Take 1 tablet (80 mg total) by mouth daily at 6 PM. What changed:  when to take this   carvedilol 25 MG tablet Commonly known as:  COREG Take 1 tablet (25 mg total) by mouth 2 (two) times daily.   cephALEXin 250 MG capsule Commonly known as:  KEFLEX Take 1 capsule (250 mg total) by mouth daily.   cetirizine 10 MG tablet Commonly known as:  ZYRTEC TAKE ONE TABLET BY MOUTH ONCE DAILY FOR ALLERGIES What changed:    how much to take  how to take this  when to take this   clobetasol cream 0.05 % Commonly known as:  TEMOVATE APPLY TO AFFECED ARES(S) TOPICALLY TWICE DAILY What changed:    how much to take  how to take this  when to take this   ferrous sulfate 325 (65 FE) MG tablet Take 1 tablet (325 mg total) by mouth daily.   fluticasone 50 MCG/ACT nasal spray Commonly known as:  FLONASE USE TWO SPRAYS INTO EACH NOSTRILS DAILY What changed:    how much to take  how to take this  when to take this   furosemide 80 MG tablet Commonly known as:  LASIX Take 80-160 mg by  mouth 2 (two) times daily. 2 Tablets in the AM and 1 Tablet in the PM   gabapentin 300 MG capsule Commonly known as:  NEURONTIN Take 300 mg by mouth at bedtime.   hydrALAZINE 100 MG tablet Commonly known as:  APRESOLINE TAKE ONE TABLET BY MOUTH THREE TIMES A DAY.   HYDROmorphone 2 MG tablet Commonly known as:  DILAUDID Take 1 tablet (2 mg total) by mouth every 6 (six) hours as needed for severe pain.   Insulin Glargine 100 UNIT/ML Solostar Pen Commonly known as:  LANTUS SOLOSTAR Inject 30 Units into the skin daily. INJECT 30 UNITS UNDER THE SKIN EVERY MORNING AND INJECT 15 UNITS EVERY NIGHT What changed:    how much to take  additional instructions   insulin lispro 100 UNIT/ML KwikPen Commonly known as:  HUMALOG KWIKPEN Inject 0.1 mLs (10 Units total) into the skin 3 (three) times daily.   Insulin Pen Needle 31G X 5 MM Misc Commonly known as:  B-D UF III MINI PEN NEEDLES Four times daily   Melatonin 5 MG Tabs Take 1 tablet (5 mg total) by mouth at bedtime.   metolazone 5 MG tablet Commonly known as:  ZAROXOLYN Take 1 tablet (5 mg total) by mouth daily.   Potassium Chloride ER 20 MEQ Tbcr  Take 20 mEq by mouth 2 (two) times daily.   verapamil 180 MG 24 hr capsule Commonly known as:  VERELAN PM Take 1 capsule (180 mg total) by mouth at bedtime. What changed:  how much to take       Disposition and follow-up:   Mr.Sofia Jelley was discharged from [facility] in Good condition.  At the hospital follow up visit please address:  1. 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 CKDV - Has AVF in place, but has not initiated HD yet - Cr at discharge 8.11 - Ensure appropriate f/u with nephrologist  2.  Labs / imaging needed at time of follow-up: renal function monitoring  3.  Pending labs/ test needing  follow-up: none  Follow-up Appointments:   Hospital Course by problem list: 1. Cellulitis and inflammatory arthropathy: Mr. 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 Afebrile without leukocytosis during hospital stay. There was no known trauma to the area other than excoriations from digital atopic dermatitis. No history of IV drug use. Left wrist and hand xrays showed soft tissue swelling and subtle erosions of the third and second PIP joints. Left wrist arthrocentesis yielded 5ml of clear yellowish fluid with 3,000 WBCs (71% neutrophils) and no crystals. No growth on synovial fluid cultures. Therefore, no concern for septic arthritis and no surgical intervention required. He received IV antibiotics (vanc and ceftriaxone, narrowed to ceftriaxone alone) and was transitioned to PO Keflex on discharge to complete a total of 7 days of abx.  2. CKDV: Follows with Dr. Justin Mend. Currently undergoing transplant evaluation with [facility]. Has LUE AVF which has never been accessed. HD has not been initiated because the patient has felt well overall despite progressively worsening kidney function. Avoided nephrotoxic agents during hospital stay and monitored patient for uremic symptoms. Discharged with close f/u with nephrology.   Discharge Vitals:   BP (!) 165/95   Pulse 67   Temp 97.9 F (36.6 C) (Oral)   Resp 20   Ht 6' (1.829 m)   Wt 113.9 kg   SpO2 96%   BMI 34.04 kg/m   Pertinent Labs, Studies, and Procedures:  CBC Latest Ref Rng & Units 09/18/2018 09/17/2018 06/29/2018  WBC 4.0 - 10.5 K/uL 6.7 8.8 7.2  Hemoglobin 13.0 - 17.0 g/dL 11.7(L) 12.3(L) 12.7(L)  Hematocrit 39.0 - 52.0 % 37.8(L) 40.2 41.3  Platelets 150 - 400 K/uL 149(L) 174 152   CMP Latest Ref Rng & Units 09/20/2018 09/19/2018 09/18/2018  Glucose 70 - 99 mg/dL 298(H) 228(H) 273(H)  BUN 6  - 20 mg/dL 98(H) 94(H) 95(H)  Creatinine 0.61 - 1.24 mg/dL 8.11(H) 7.99(H) 8.17(H)  Sodium 135 - 145 mmol/L 137 141 142  Potassium 3.5 - 5.1 mmol/L 2.9(L) 3.1(L) 3.3(L)  Chloride 98 - 111 mmol/L 94(L) 94(L) 99  CO2 22 - 32 mmol/L 26 29 27   Calcium 8.9 - 10.3 mg/dL 9.4 9.4 8.8(L)  Total Protein 6.5 - 8.1 g/dL - - -  Total Bilirubin 0.3 - 1.2 mg/dL - - -  Alkaline Phos 38 - 126 U/L - - -  AST 15 - 41 U/L - - -  ALT 0 - 44 U/L - - -  Left wrist xray 09/17/2018 1. 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 Associated mass in this area can not be excluded. There is no soft tissue air. 2. No fracture or dislocation. No appreciable joint space narrowing or erosion.  Left hand xray 09/17/2018 1. Postoperative changes lateral to the distal radius or better appreciated on wrist views. 2. Subtle erosions in the second and third PIP joints. Mild narrowing of all DIP joints. 3.  No fracture or dislocation.  Discharge Instructions: Discharge Instructions    Discharge instructions   Complete by:  As directed    It was a pleasure taking care of you in the hospital, Mr. Pembroke.  1. You were hospitalized for an infection of your left wrist and treated with antibiotics. You should take three more days of the antibiotic Keflex after you leave the hospital.  2. I have prescribed you a short course of hydromorphone for pain. This will only last a few days, so only take the pain medication if and when you have severe pain. You can also take tylenol at home for the pain if it is not severe.  3. If the swelling and pain worsen or you develop redness over the wrist, please see a healthcare provider immediately.   Feel free to call our clinic at [phone] if you have any questions.  Thanks, Dr. Annie Paras   Increase activity slowly   Complete by:  As directed       Signed: Dorrell, Andree Elk, MD 09/20/2018, 11:40 AM   Pager:  [phone]

## 2018-09-20 ENCOUNTER — Encounter (HOSPITAL_COMMUNITY): Payer: Self-pay | Admitting: General Practice

## 2018-09-20 DIAGNOSIS — G4733 Obstructive sleep apnea (adult) (pediatric): Secondary | ICD-10-CM

## 2018-09-20 DIAGNOSIS — Z720 Tobacco use: Secondary | ICD-10-CM

## 2018-09-20 DIAGNOSIS — M13132 Monoarthritis, not elsewhere classified, left wrist: Secondary | ICD-10-CM

## 2018-09-20 LAB — RENAL FUNCTION PANEL
Albumin: 2.9 g/dL — ABNORMAL LOW (ref 3.5–5.0)
Anion gap: 17 — ABNORMAL HIGH (ref 5–15)
BUN: 98 mg/dL — ABNORMAL HIGH (ref 6–20)
CO2: 26 mmol/L (ref 22–32)
Calcium: 9.4 mg/dL (ref 8.9–10.3)
Chloride: 94 mmol/L — ABNORMAL LOW (ref 98–111)
Creatinine, Ser: 8.11 mg/dL — ABNORMAL HIGH (ref 0.61–1.24)
GFR calc Af Amer: 8 mL/min — ABNORMAL LOW (ref >60)
GFR, EST NON AFRICAN AMERICAN: 7 mL/min — AB (ref >60)
Glucose, Bld: 298 mg/dL — ABNORMAL HIGH (ref 70–99)
Phosphorus: 9.2 mg/dL — ABNORMAL HIGH (ref 2.5–4.6)
Potassium: 2.9 mmol/L — ABNORMAL LOW (ref 3.5–5.1)
Sodium: 137 mmol/L (ref 135–145)

## 2018-09-20 LAB — BODY FLUID CULTURE: CULTURE: NO GROWTH

## 2018-09-20 LAB — GLUCOSE, CAPILLARY
Glucose-Capillary: 281 mg/dL — ABNORMAL HIGH (ref 70–99)
Glucose-Capillary: 229 mg/dL — ABNORMAL HIGH (ref 70–99)
Glucose-Capillary: 246 mg/dL — ABNORMAL HIGH (ref 70–99)

## 2018-09-20 MED ORDER — HYDROMORPHONE HCL 2 MG PO TABS: 2.0000 mg | ORAL_TABLET | Freq: Four times a day (QID) | ORAL | 0 refills | Status: DC | PRN

## 2018-09-20 MED ORDER — CEPHALEXIN 250 MG PO CAPS: 250.0000 mg | ORAL_CAPSULE | Freq: Every day | ORAL | 0 refills | Status: DC

## 2018-09-20 MED ORDER — CEPHALEXIN 250 MG PO CAPS
250.0000 mg | ORAL_CAPSULE | Freq: Every day | ORAL | Status: DC
  Administered 2018-09-20: 250 mg via ORAL
  Filled 2018-09-20: qty 1

## 2018-09-20 MED ORDER — CEPHALEXIN 250 MG/5ML PO SUSR: 250.0000 mg | Freq: Every day | ORAL | Status: DC

## 2018-09-20 MED ORDER — POTASSIUM CHLORIDE CRYS ER 20 MEQ PO TBCR
20.0000 meq | EXTENDED_RELEASE_TABLET | Freq: Once | ORAL | Status: AC
  Administered 2018-09-20: 20 meq via ORAL
  Filled 2018-09-20: qty 1

## 2018-09-20 NOTE — Progress Notes (Signed)
Inpatient Diabetes Program Recommendations  AACE/ADA: New Consensus Statement on Inpatient Glycemic Control (2015)  Target Ranges:  Prepandial:   less than 140 mg/dL      Peak postprandial:   less than 180 mg/dL (1-2 hours)      Critically ill patients:  140 - 180 mg/dL   Lab Results  Component Value Date   GLUCAP 229 (H) 09/20/2018   HGBA1C 5.9 (A) 08/18/2018    Review of Glycemic Control Results for Jerry Cantrell, Jerry Cantrell (MRN [id number]) as of 09/20/2018 10:43  Ref. Range 09/19/2018 13:32 09/19/2018 17:40 09/19/2018 21:14 09/20/2018 05:13 09/20/2018 08:10  Glucose-Capillary Latest Ref Range: 70 - 99 mg/dL 370 (H) 111 (H) 307 (H) 281 (H) 229 (H)   Diabetes history: DM2 Home DM Meds: Lantus 35 units Daily                             Humalog 10 units TID with meals  Current Orders: Lantus 20 units Daily                            Novolog Sensitive Correction Scale/ SSI (0-9 units) TID AC + HS                            Novolog 5 units TID with meals  Inpatient Diabetes Program Recommendations:    Fasting CBG elevated. -Increase Lantus to home dose of 35 units daily (give 15 units additional dose today)  Thank you, Bethena Roys E. Josilynn Losh, RN, MSN, CDE  Diabetes Coordinator Inpatient Glycemic Control Team Team Pager [phone] (8am-5pm) 09/20/2018 10:44 AM

## 2018-09-20 NOTE — Care Management Important Message (Signed)
Important Message  Patient Details  Name: Jerry Cantrell MRN: [id number] Date of Birth: 1965-10-07   Medicare Important Message Given:  Yes    Nivea Wojdyla [address] 09/20/2018, 3:36 PM

## 2018-09-20 NOTE — Progress Notes (Signed)
Reviewed AVS discharge instructions with patient/caregiver. Patient/caregiver verbalizes understanding of instructions received. AVS and prescriptions received by patient/caregiver. If present, telemetry box removed and central cardiac monitoring department notified of discharge. Peripheral IV removed, site benign with tip intact.  

## 2018-09-20 NOTE — Progress Notes (Signed)
   Subjective: No overnight events. Patient reports he is still having some pain in his wrist. Overall, he feels okay except that he feels "crazy" when his sugars are off. It was explained we have been treating his diabetes conservatively due to the hypoglycemic event he had early in admission. He states the pain in his wrist is still a 7-7.5/10.  All questions and concerns addressed.  Objective:  Vital signs in last 24 hours: Vitals:   09/19/18 1435 09/19/18 1803 09/19/18 2118 09/20/18 0540  BP: (!) 185/91 (!) 173/95 (!) 188/94 (!) 155/82  Pulse: 74  81 71  Resp: 16  (!) 21   Temp: 98.4 F (36.9 C)  98.4 F (36.9 C) (!) 97.3 F (36.3 C)  TempSrc: Oral  Oral Oral  SpO2: 98%  98% 95%  Weight:      Height:       Gen: laying comfortably in bed, no distress Extremities: No erythema of left wrist, mild swelling that is improved compared to prior. No increased warmth or tenderness to palpation.   Assessment/Plan:  Principal Problem:   Inflammatory arthropathy Active Problems:   Insulin dependent type 2 diabetes mellitus, uncontrolled (HCC)   Hypertension   Obstructive sleep apnea   CKD (chronic kidney disease), stage V (HCC)   Cellulitis of left upper extremity   Inflammatory monoarthritis of wrist, left  Mr. 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 He was admitted for further evaluation and workup.  Inflammatory Arthropathy -Swelling, erythema, and tenderness continue to improve -Remains afebrile without leukocytosis  - Synovial fluid cultures without growth x72hrs. Blood cx also without growth. - No operative intervention required per hand surgery Plan - Discharge home today -Transition to Keflex PO today. Renal dosing for CKDV. -Pain control: Dilaudid 2mg  q6hrs PRN  DMII - CBGs above goal of  <180 Plan -ContinueLantus 20u daily - 5u Novolog TID with meals - Continue home gabapentin 300mg  qhs  CKDV - Cr stable at 8.11 today Plan -Continue to monitor for uremic symptoms.  - Avoid nephrotoxic medications  HTN - BP 155/82 this am Plan -Continue home coreg 25mg  BID, hydralazine 100mg  TID, verapamil 180mg  daily, and lasix 120mg  am/80mg  pm  OSA: CPAP at night RCV:ELFYBOFB lipitor 80mg  daily Tobacco PZW:CHENIDPO patch  Dispo: Anticipated discharge today.  Vernita Tague, Andree Elk, MD 09/20/2018, 6:55 AM Pager: [phone]

## 2018-09-20 NOTE — Consult Note (Addendum)
   [facility] Inpatient Consult   09/20/2018  Jerry Cantrell 29-Oct-1965 [id number]    Patient screened for potential [facility] Care Management services due to high unplanned  readmission risk score.  Spoke with [name] at bedside prior to hospitalization. Explained and offered [facility] program services.   [name] pleasantly declines [facility] Care Management follow up. He denies having any transportation, medication, or disease management needs.   Provided [facility] Care Management brochure with contact information to call should he change his mind in the future.    Marthenia Rolling, MSN-Ed, RN,BSN [facility] Liaison [phone]

## 2018-09-22 ENCOUNTER — Ambulatory Visit: Payer: Self-pay | Admitting: Internal Medicine

## 2018-09-22 DIAGNOSIS — E113512 Type 2 diabetes mellitus with proliferative diabetic retinopathy with macular edema, left eye: Secondary | ICD-10-CM | POA: Diagnosis not present

## 2018-09-22 LAB — CULTURE, BLOOD (ROUTINE X 2)
Culture: NO GROWTH
Culture: NO GROWTH
Special Requests: ADEQUATE

## 2018-09-26 ENCOUNTER — Encounter: Payer: Medicare Other | Attending: Nephrology | Admitting: Dietician

## 2018-09-26 ENCOUNTER — Encounter: Payer: Self-pay | Admitting: Dietician

## 2018-09-26 DIAGNOSIS — H20012 Primary iridocyclitis, left eye: Secondary | ICD-10-CM | POA: Diagnosis not present

## 2018-09-26 DIAGNOSIS — N185 Chronic kidney disease, stage 5: Secondary | ICD-10-CM | POA: Diagnosis not present

## 2018-09-26 DIAGNOSIS — H3582 Retinal ischemia: Secondary | ICD-10-CM | POA: Diagnosis not present

## 2018-09-26 DIAGNOSIS — E113512 Type 2 diabetes mellitus with proliferative diabetic retinopathy with macular edema, left eye: Secondary | ICD-10-CM | POA: Diagnosis not present

## 2018-09-26 DIAGNOSIS — H11432 Conjunctival hyperemia, left eye: Secondary | ICD-10-CM | POA: Diagnosis not present

## 2018-09-26 DIAGNOSIS — H5712 Ocular pain, left eye: Secondary | ICD-10-CM | POA: Diagnosis not present

## 2018-09-26 NOTE — Progress Notes (Signed)
  Medical Nutrition Therapy:  Appt start time: 1030 end time:  1130.   Assessment:  Primary concerns today: Patient is here today with his wife.  He wants to know what to eat.  He is feeling sick today (nausea) and states that he sees his nephrologist this week.  BG 204 here today (checked due to patient feeling low). He states that he should have been on dialysis a couple of months ago.  He plans on having HD for a month and then transitioning to home dialysis.  GRF is 8 09/20/2018.  Other labs 09/20/2018 include Potassium 2.9, BUN 98, Creatine 8.11, Phosphorous 9.2.  A1C 5.9% 08/18/18.  History includes type 2 diabetes (about 15 years), hypercholesterolemia, HTN, combined hyperlipidemia, neuropathy, retinopathy, OSA on C-pap, constipation, CHF, lower back pain, and CKD.  Medications include Lantus- patient is taking 40-45 units each am depending on what his BG is.  He is taking Humalog 5-10 units q meal.    Patient's wife Kennyth Lose is currently living with him.  They separated but she moved back in October to help him out.  They share shopping and cooking.  He is not eating well as he does not feel good.  He is on disability due to poor vision. He has seen another RD in the past.  He was going to have bariatric surgery but lost weight on his own.   Preferred Learning Style: Limits due to poor vision  Auditory  Hands on  Learning Readiness:   Contemplating  Change in progress    DIETARY INTAKE:  Uses very little salt ("pinch"), no canned food except rinsed green beans. 24-hr recall:  B (8-11 AM): 1 pack instant oatmeal  Snk ( AM): none  L ( PM): none Snk ( PM): rare D ( PM): baked chicken or cube steak, vegetable, rice or pasta OR stirfry Snk ( PM): honey bun or pound cake Beverages: water, hot tea with splenda  Usual physical activity: ADL's Used to walk with the dogs but not much currently as he does not feel well.  Estimated energy needs: 2200 calories 75 g protein  Progress  Towards Goal(s):  In progress.   Nutritional Diagnosis:  NB-1.1 Food and nutrition-related knowledge deficit As related to diet for kidney disease.  As evidenced by patient report.    Intervention:  Nutrition education related to renal diet.  Discussed current needs predialysis and post dialysis needs.  Currently he is on a potassium supplement and does not need a strict dietary restriction.  Discussed low sodium, low phosphorous in detail.  He is not eating well.  Discussed need for improved nutrition. Discussed resources for other meal ideas. Encouraged patient to call for any questions and when transitioned to dialysis.  Plan: To treat a low blood sugar (less than 70):  1/2 cup regular sprite or gingerale OR  1/2 cup apple or cranberry juice OR   3-4 glucose tabs Resources:   [email or web address]- information, recipes, and downloadable cookbook Avoid salt, salt substitute (lite and NO salt products), seasonings with added potassium, foods with added phosphorous. Choose fresh meat  Teaching Method Utilized:  Auditory Hands on  Handouts given during visit include:  Kidney diet pyramid from [employer]  Barriers to learning/adherence to lifestyle change: illness, poor vision  Demonstrated degree of understanding via:  Teach Back   Monitoring/Evaluation:  Dietary intake, exercise, and body weight prn.

## 2018-09-26 NOTE — Patient Instructions (Signed)
To treat a low blood sugar (less than 70):  1/2 cup regular sprite or gingerale OR  1/2 cup apple or cranberry juice OR   3-4 glucose tabs  Resources:   [email or web address]- information, recipes, and downloadable cookbook  Avoid salt, salt substitute (lite and NO salt products), seasonings with added potassium, foods with added phosphorous.  Choose fresh meat

## 2018-09-27 DIAGNOSIS — M545 Low back pain: Secondary | ICD-10-CM | POA: Diagnosis not present

## 2018-09-27 DIAGNOSIS — G8929 Other chronic pain: Secondary | ICD-10-CM | POA: Diagnosis not present

## 2018-09-27 DIAGNOSIS — M5431 Sciatica, right side: Secondary | ICD-10-CM | POA: Diagnosis not present

## 2018-09-27 DIAGNOSIS — G894 Chronic pain syndrome: Secondary | ICD-10-CM | POA: Diagnosis not present

## 2018-09-27 DIAGNOSIS — M5432 Sciatica, left side: Secondary | ICD-10-CM | POA: Diagnosis not present

## 2018-09-27 DIAGNOSIS — Z79891 Long term (current) use of opiate analgesic: Secondary | ICD-10-CM | POA: Diagnosis not present

## 2018-10-04 ENCOUNTER — Ambulatory Visit: Payer: Self-pay | Admitting: Internal Medicine

## 2018-10-04 NOTE — Progress Notes (Deleted)
Name: Jerry Cantrell  Age/ Sex: 52 y.o., male   MRN/ DOB: [id number], December 06, 1965     PCP: Forrest Moron, MD   Reason for Endocrinology Evaluation: Type 2 Diabetes Mellitus  Initial Endocrine Consultative Visit: 08/22/2018    PATIENT IDENTIFIER: Jerry Cantrell is a 53 y.o. male with a past medical history of HTN, T2DM, dyslipidemia and CKD V, OSA on CPAP . The patient has followed with Endocrinology clinic since 08/22/2018 for consultative assistance with management of his diabetes.  DIABETIC HISTORY:  Jerry Cantrell was diagnosed with T2DM ~ in 2004, he does not recall oral glycemic agents that he has tried in the past.He has been on insulin therapy for years. His hemoglobin A1c has ranged from 7.0 % in 01/2018 peaking at 9.1%in 2018   SUBJECTIVE:   During the last visit (08/22/18): His A1c was 5.9% due to   Today (10/04/2018): Jerry Cantrell  He checks his blood sugars *** times daily, preprandial to breakfast and ***. The patient has *** had hypoglycemic episodes since the last clinic visit, which typically occur *** x / - most often occuring ***. The patient is *** symptomatic with these episodes, with symptoms of {symptoms; hypoglycemia:9084048}. Otherwise, the patient {HAS/HAS NOT:522402} required any recent emergency interventions for hypoglycemia and {HAS/HAS NOT:522402} had recent hospitalizations secondary to hyper or hypoglycemic episodes.    ROS: As per HPI and as detailed below: ROS    HOME DIABETES REGIMEN:    Statin: *** ACE-I/ARB: *** Prior Diabetic Education: ***   METER DOWNLOAD SUMMARY: Date range evaluated: *** Fingerstick Blood Glucose Tests = *** Average Number Tests/Day = *** Overall Mean FS Glucose = *** Standard Deviation = ***  BG Ranges: Low = *** High = ***   Hypoglycemic Events/30 Days: BG < 50 = *** Episodes of symptomatic severe hypoglycemia = ***    DIABETIC  COMPLICATIONS: Microvascular complications:   ***  Denies:   Last Eye Exam: Completed   Macrovascular complications:   ***  Denies: CAD, CVA, PVD   HISTORY:  Past Medical History:  Past Medical History:  Diagnosis Date  . Acute diastolic heart failure ([place])   . CHF (congestive heart failure) ([place])   . Diabetes mellitus with nephropathy ([place]) 05/12/2012   Dr. Justin Mend follows  . Hemorrhoids 05/12/2012  . Hyperlipidemia   . Hypertension   . Lumbar spinal stenosis 05/03/2012   Mild with only right L4 nerve root encroachment, no neurogenic claudication   . Lumbar spondylosis 05/03/2012  . Meralgia paraesthetica 05/03/2012  . Meralgia paraesthetica 05/03/2012   On Lyrica which does improve pain.   Marland Kitchen Neuropathy in diabetes ([place]) 05/12/2012  . Obesity   . Pneumonia   . Retinopathy   . Sleep apnea    uses cpap  . Substance abuse ([place])   . Urinary incontinence 05/12/2012   Since the start of Sept. 2013     Past Surgical History:  Past Surgical History:  Procedure Laterality Date  . ABDOMINAL SURGERY     Abscess I&D 2/2 infected hair  . AV FISTULA PLACEMENT Left 02/06/2016   Procedure: LEFT ARM RADIOCEPHALIC ARTERIOVENOUS (AV) FISTULA CREATION;  Surgeon: Rosetta Posner, MD;  Location: [facility];  Service: Vascular;  Laterality: Left;  . COLONOSCOPY W/ POLYPECTOMY     pt to bring records  . COLONOSCOPY WITH PROPOFOL N/A 06/05/2016   Procedure: COLONOSCOPY WITH PROPOFOL;  Surgeon: Doran Stabler, MD;  Location: [facility];  Service: Gastroenterology;  Laterality: N/A;  . TONSILLECTOMY  Social History:  reports that he has been smoking cigarettes. He has a 15.00 pack-year smoking history. He has never used smokeless tobacco. He reports that he does not drink alcohol or use drugs. Family History:  Family History  Problem Relation Age of Onset  . Prostate cancer Father   . Alcohol abuse Father   . Emphysema Father        smoked  . Colon cancer Neg Hx       HOME  MEDICATIONS: Allergies as of 10/04/2018   No Known Allergies     Medication List       Accurate as of October 04, 2018  9:32 AM. Always use your most recent med list.        ACCU-CHEK AVIVA PLUS test strip Generic drug:  glucose blood USE AS DIRECTED TO CHECK BLOOD SUGAR 5 TIMES A DAY BEFORE MEALS AND AS NEEDED.   ACCU-CHEK SOFTCLIX LANCETS lancets USE AS DIRECTED TO CHECK BLOOD SUGAR 5 TIMES A DAY BEFORE MEALS AND AS NEEDED.   aspirin EC 81 MG tablet Take 1 tablet (81 mg total) by mouth daily.   atorvastatin 80 MG tablet Commonly known as:  LIPITOR Take 1 tablet (80 mg total) by mouth daily at 6 PM.   carvedilol 25 MG tablet Commonly known as:  COREG Take 1 tablet (25 mg total) by mouth 2 (two) times daily.   cephALEXin 250 MG capsule Commonly known as:  KEFLEX Take 1 capsule (250 mg total) by mouth daily.   cetirizine 10 MG tablet Commonly known as:  ZYRTEC TAKE ONE TABLET BY MOUTH ONCE DAILY FOR ALLERGIES   clobetasol cream 0.05 % Commonly known as:  TEMOVATE APPLY TO AFFECED ARES(S) TOPICALLY TWICE DAILY   ferrous sulfate 325 (65 FE) MG tablet Take 1 tablet (325 mg total) by mouth daily.   fluticasone 50 MCG/ACT nasal spray Commonly known as:  FLONASE USE TWO SPRAYS INTO EACH NOSTRILS DAILY   furosemide 80 MG tablet Commonly known as:  LASIX Take 80-160 mg by mouth 2 (two) times daily. 2 Tablets in the AM and 1 Tablet in the PM   gabapentin 300 MG capsule Commonly known as:  NEURONTIN Take 300 mg by mouth at bedtime.   hydrALAZINE 100 MG tablet Commonly known as:  APRESOLINE TAKE ONE TABLET BY MOUTH THREE TIMES A DAY.   HYDROmorphone 2 MG tablet Commonly known as:  DILAUDID Take 1 tablet (2 mg total) by mouth every 6 (six) hours as needed for severe pain.   Insulin Glargine 100 UNIT/ML Solostar Pen Commonly known as:  LANTUS SOLOSTAR Inject 30 Units into the skin daily. INJECT 30 UNITS UNDER THE SKIN EVERY MORNING AND INJECT 15 UNITS EVERY NIGHT    insulin lispro 100 UNIT/ML KwikPen Commonly known as:  HUMALOG KWIKPEN Inject 0.1 mLs (10 Units total) into the skin 3 (three) times daily.   Insulin Pen Needle 31G X 5 MM Misc Commonly known as:  B-D UF III MINI PEN NEEDLES Four times daily   Melatonin 5 MG Tabs Take 1 tablet (5 mg total) by mouth at bedtime.   metolazone 5 MG tablet Commonly known as:  ZAROXOLYN Take 1 tablet (5 mg total) by mouth daily.   multivitamin-iron-minerals-folic acid chewable tablet Chew 1 tablet by mouth daily.   Potassium Chloride ER 20 MEQ Tbcr Take 20 mEq by mouth 2 (two) times daily.   verapamil 180 MG 24 hr capsule Commonly known as:  VERELAN PM Take 1 capsule (180 mg total)  by mouth at bedtime.        OBJECTIVE:   Vital Signs: There were no vitals taken for this visit.  Wt Readings from Last 3 Encounters:  09/26/18 255 lb (115.7 kg)  09/20/18 249 lb 14.4 oz (113.4 kg)  08/22/18 272 lb 6.4 oz (123.6 kg)     Exam: General: Pt appears well and is in NAD  Hydration: Well-hydrated with moist mucous membranes and good skin turgor  HEENT: Head: Unremarkable with good dentition. Oropharynx clear without exudate.  Eyes: External eye exam normal without stare, lid lag or exophthalmos.  EOM intact.  PERRL.  Neck: General: Supple without adenopathy. Thyroid: Thyroid size normal.  No goiter or nodules appreciated. No thyroid bruit.  Lungs: Clear with good BS bilat with no rales, rhonchi, or wheezes  Heart: RRR with normal S1 and S2 and no gallops; no murmurs; no rub  Abdomen: Normoactive bowel sounds, soft, nontender, without masses or organomegaly palpable  Extremities: No pretibial edema. No tremor. Normal strength and motion throughout. See detailed diabetic foot exam below.  Skin: Normal texture and temperature to palpation. No rash noted. No Acanthosis nigricans/skin tags. No lipohypertrophy.  Neuro: MS is good with appropriate affect, pt is alert and Ox3    DM foot exam:12/23 The  skin of the feet is intact without sores or ulcerations. Pt with b/l plantar callous formation  The pedal pulses are 2+ on right and 2+ on left. The sensation is intact to a screening 5.07, 10 gram monofilament bilaterally       DATA REVIEWED:  Lab Results  Component Value Date   HGBA1C 5.9 (A) 08/18/2018   HGBA1C 7.0 (A) 02/14/2018   HGBA1C 7.7 11/11/2017   Lab Results  Component Value Date   MICROALBUR 72.5 06/16/2016   LDLCALC 69 05/10/2017   CREATININE 8.11 (H) 09/20/2018   Lab Results  Component Value Date   MICRALBCREAT 1,926.1 (H) 06/16/2013    No results found for: FRUCTOSAMINE   Lab Results  Component Value Date   CHOL 175 05/10/2017   HDL 35 (L) 05/10/2017   LDLCALC 69 05/10/2017   LDLDIRECT 99 09/16/2010   TRIG 353 (H) 05/10/2017   CHOLHDL 5.0 05/10/2017         ASSESSMENT / PLAN / RECOMMENDATIONS:   1) Type {NUMBERS 1 OR 2:522190} Diabetes Mellitus, ***controlled, With *** complications - Most recent A1c of *** %. Goal A1c < *** %.  ***  Plan: MEDICATIONS:  ***  EDUCATION / INSTRUCTIONS:  BG monitoring instructions: Patient is instructed to check his blood sugars *** times a day, ***.  Call [facility] if: BG persistently < 70 or > 300. . I reviewed the Rule of 15 for the treatment of hypoglycemia in detail with the patient. Literature supplied.  REFERRALS:  ***.   2) Diabetic complications:   Eye: Does *** have known diabetic retinopathy.   Neuro/ Feet: Does *** have known diabetic peripheral neuropathy .   Renal: Patient does *** have known baseline CKD. Last SCr on @LABRSLTLINEBRIEFNR (Creatinine)@. Last microalb/cr ***. He   is ***on an ACEI/ARB at present.   3) Lipids: Patient is *** on a statin.  4) Hypertension: *** at goal of < 140/90 mmHg.    F/U in ***    Signed electronically by: Mack Guise, MD  [facility]  [facility] [address] [place],  [place] [address] Phone: [phone] FAX: [phone]   CC: Forrest Moron, MD [place]  [address] [place] [address] Phone: [phone]  Fax: [phone]  Return to Endocrinology clinic as below: Future Appointments  Date Time Provider [place]  10/04/2018 10:10 AM Jontavia Leatherbury, Melanie Crazier, MD [facility] None  10/29/2018 11:00 AM Forrest Moron, MD [facility]  11/04/2018 11:45 AM Evelina Bucy, DPM [facility]  11/28/2018 10:20 AM Fay Records, MD [facility]  01/06/2019  2:00 PM Parrett, Fonnie Mu, NP [facility] None

## 2018-10-12 DIAGNOSIS — M545 Low back pain: Secondary | ICD-10-CM | POA: Diagnosis not present

## 2018-10-12 DIAGNOSIS — Z79891 Long term (current) use of opiate analgesic: Secondary | ICD-10-CM | POA: Diagnosis not present

## 2018-10-12 DIAGNOSIS — G8929 Other chronic pain: Secondary | ICD-10-CM | POA: Diagnosis not present

## 2018-10-12 DIAGNOSIS — G894 Chronic pain syndrome: Secondary | ICD-10-CM | POA: Diagnosis not present

## 2018-10-12 DIAGNOSIS — M5432 Sciatica, left side: Secondary | ICD-10-CM | POA: Diagnosis not present

## 2018-10-12 DIAGNOSIS — M5431 Sciatica, right side: Secondary | ICD-10-CM | POA: Diagnosis not present

## 2018-10-13 DIAGNOSIS — I10 Essential (primary) hypertension: Secondary | ICD-10-CM | POA: Diagnosis not present

## 2018-10-17 ENCOUNTER — Ambulatory Visit: Payer: Self-pay | Admitting: *Deleted

## 2018-10-17 NOTE — Telephone Encounter (Signed)
Pt called with complaints of constipation and not having a BM in 4-5 days; he says that the kidney MD  (DR Justin Mend, [facility]) gave him medication that makes it hard for him to have a BM; he now takes narcotics which further constipates him; he says that he had a partial BM today after he disimpacted himself; the pt says that he does not have pain in his intestines or stomach; he also says that he has taken a stool softener, senna-docusate 8.5-5 mg (last dose around 10/15/2018) but they do not help; he requests a prescription for an enema or something stronger; explained to pt that due to his kidney disease he needs to be seen in the office; explained that since the medication was prescribed per renal, and the circumstances due to his kidney disease, he could also possibly contact them for recommendations; he says that the stool softener was per that office; recommendations made per nurse triage protocol; he normally sees Dr Delia Chimes but she has no availability within parameters within guidelines; pt offered and accepted appointment with Dr Merri Ray, [facility] at 0800; also explained to pt that this is for constipation, and he will still need to be seen by Dr Nolon Rod on  10/29/2018; he verbalized understanding; notified Theadora Rama; will also route to office for notification.    Reason for Disposition . Unable to have a bowel movement (BM) without manually removing stool (using finger to pull out stool or perform disimpaction)  Answer Assessment - Initial Assessment Questions 1. STOOL PATTERN OR FREQUENCY: "How often do you pass bowel movements (BMs)?"  (Normal range: tid to q 3 days)  "When was the last BM passed?"       10/12/2018 2. STRAINING: "Do you have to strain to have a BM?"      yes 3. RECTAL PAIN: "Does your rectum hurt when the stool comes out?" If so, ask: "Do you have hemorrhoids? How bad is the pain?"  (Scale 1-10; or mild, moderate, severe)     Yes pain from  pushing rated 10 out of 10 4. STOOL COMPOSITION: "Are the stools hard?"    Yes large and hard 5. BLOOD ON STOOLS: "Has there been any blood on the toilet tissue or on the surface of the BM?" If so, ask: "When was the last time?"      Sees blood on toliet tissue 6. CHRONIC CONSTIPATION: "Is this a new problem for you?"  If no, ask: "How long have you had this problem?" (days, weeks, months)      Started auryxia 210 mg with every meal started 2 months ago and cinacalcet 60 mg daily in morning 08/05/28 7. CHANGES IN DIET: "Have there been any recent changes in your diet?"      Started renal diet November 2019 8. MEDICATIONS: "Have you been taking any new medications?"     auryxia and cinacalet 9. LAXATIVES: "Have you been using any laxatives or enemas?"  If yes, ask "What, how often, and when was the last time?"    no  10. CAUSE: "What do you think is causing the constipation?"        medication 11. OTHER SYMPTOMS: "Do you have any other symptoms?" (e.g., abdominal pain, fever, vomiting)       Rectal pain from pushing 12. PREGNANCY: "Is there any chance you are pregnant?" "When was your last menstrual period?"      n/a  Protocols used: CONSTIPATION-A-AH

## 2018-10-18 ENCOUNTER — Other Ambulatory Visit: Payer: Self-pay

## 2018-10-18 ENCOUNTER — Ambulatory Visit (INDEPENDENT_AMBULATORY_CARE_PROVIDER_SITE_OTHER): Payer: Medicare Other

## 2018-10-18 ENCOUNTER — Ambulatory Visit (INDEPENDENT_AMBULATORY_CARE_PROVIDER_SITE_OTHER): Payer: Medicare Other | Admitting: Family Medicine

## 2018-10-18 ENCOUNTER — Encounter: Payer: Self-pay | Admitting: Family Medicine

## 2018-10-18 VITALS — BP 134/76 | HR 68 | Temp 98.4°F | Resp 16 | Ht 70.0 in | Wt 261.6 lb

## 2018-10-18 DIAGNOSIS — K5903 Drug induced constipation: Secondary | ICD-10-CM

## 2018-10-18 DIAGNOSIS — R109 Unspecified abdominal pain: Secondary | ICD-10-CM

## 2018-10-18 DIAGNOSIS — N185 Chronic kidney disease, stage 5: Secondary | ICD-10-CM

## 2018-10-18 DIAGNOSIS — K5900 Constipation, unspecified: Secondary | ICD-10-CM | POA: Diagnosis not present

## 2018-10-18 MED ORDER — GLYCERIN (ADULT) 2 G RE SUPP: 1.0000 | RECTAL | 0 refills | Status: DC | PRN

## 2018-10-18 MED ORDER — POLYETHYLENE GLYCOL 3350 17 GM/SCOOP PO POWD: 17.0000 g | Freq: Two times a day (BID) | ORAL | 1 refills | Status: DC | PRN

## 2018-10-18 NOTE — Telephone Encounter (Signed)
Pt was seen today by provider.

## 2018-10-18 NOTE — Patient Instructions (Addendum)
I suspect constipation worsened off of previous medication and with the addition of oxycodone. For now can try MiraLAX twice per day until more normal bowel movements, then can once per day.  Glycerin suppository if needed for hard stool in the rectum.  Keep follow-up with nephrologist in 2 days to discuss other treatments for constipation.  Make sure to drink sufficient fluid and fiber in the diet.  See information below.  Return to the clinic or go to the nearest emergency room if any of your symptoms worsen or new symptoms occur.    Constipation, Adult Constipation is when a person has fewer bowel movements in a week than normal, has difficulty having a bowel movement, or has stools that are dry, hard, or larger than normal. Constipation may be caused by an underlying condition. It may become worse with age if a person takes certain medicines and does not take in enough fluids. Follow these instructions at home: Eating and drinking   Eat foods that have a lot of fiber, such as fresh fruits and vegetables, whole grains, and beans.  Limit foods that are high in fat, low in fiber, or overly processed, such as french fries, hamburgers, cookies, candies, and soda.  Drink enough fluid to keep your urine clear or pale yellow. General instructions  Exercise regularly or as told by your health care provider.  Go to the restroom when you have the urge to go. Do not hold it in.  Take over-the-counter and prescription medicines only as told by your health care provider. These include any fiber supplements.  Practice pelvic floor retraining exercises, such as deep breathing while relaxing the lower abdomen and pelvic floor relaxation during bowel movements.  Watch your condition for any changes.  Keep all follow-up visits as told by your health care provider. This is important. Contact a health care provider if:  You have pain that gets worse.  You have a fever.  You do not have a  bowel movement after 4 days.  You vomit.  You are not hungry.  You lose weight.  You are bleeding from the anus.  You have thin, pencil-like stools. Get help right away if:  You have a fever and your symptoms suddenly get worse.  You leak stool or have blood in your stool.  Your abdomen is bloated.  You have severe pain in your abdomen.  You feel dizzy or you faint. This information is not intended to replace advice given to you by your health care provider. Make sure you discuss any questions you have with your health care provider. Document Released: 05/15/2004 Document Revised: 03/06/2016 Document Reviewed: 02/05/2016 Elsevier Interactive Patient Education  [employer].  If you have lab work done today you will be contacted with your lab results within the next 2 weeks.  If you have not heard from [place] then please contact us. The fastest way to get your results is to register for My Chart.   IF you received an x-ray today, you will receive an invoice from [facility]. Please contact [facility] at [phone] with questions or concerns regarding your invoice.   IF you received labwork today, you will receive an invoice from [facility]. Please contact [facility] at [phone] with questions or concerns regarding your invoice.   Our billing staff will not be able to assist you with questions regarding bills from these companies.  You will be contacted with the lab results as soon as they are available. The fastest way  to get your results is to activate your My Chart account. Instructions are located on the last page of this paperwork. If you have not heard from [place] regarding the results in 2 weeks, please contact this office.

## 2018-10-18 NOTE — Progress Notes (Signed)
Subjective:    Patient ID: Jerry Cantrell, male    DOB: 02-10-1966, 53 y.o.   MRN: [id number]  HPI Jerry Cantrell is a 53 y.o. male Presents today for: Chief Complaint  Patient presents with  . Constipation    constipation for a few months, able to had a little bowl moment yday.Thinks the auryxia, cinacalcet and oxycodone is cousing the constipation. Was given senna-docusate is not helping   Constipation; History of chronic kidney disease. Not currently on hemodialysis - has fistula if needed.   Other medical problems per problem list including insulin-dependent diabetes, peripheral neuropathy, chronic low back pain. Did have some constipation for a few months - thinks related to [place] since October, then cinalcacet. Nephrologist (Dr. Justin Mend) prescribed senna-docusate around October.  Had been taking about 3 times per day, with BM every 3-4 days.  Increased up to 5 at a time few times per day - still didn't help. Started on oxycodone about 3 weeks ago. 10/300 - Takes 3 per day for back pain.  Constipation worse. BM yesterday after straining - had to manually disimpact.  Small amount of stool then. Rare nausea, no vomiting. On iron 325mg  QD. Also on Lyrica  50mg  tid. Has appt with nephrology in 2 days.   Tx: 1/2 liter of prune juice yesterday. No other meds for constipation recently.  Patient Active Problem List   Diagnosis Date Noted  . Cellulitis of left upper extremity   . Inflammatory monoarthritis of wrist, left   . Inflammatory arthropathy 09/17/2018  . Hyperparathyroidism ([place]) 08/18/2018  . Primary insomnia 08/18/2018  . Chronic pain syndrome 08/18/2018  . CKD stage 5 due to type 2 diabetes mellitus ([place]) 08/18/2018  . COPD (chronic obstructive pulmonary disease) ([place]) 02/10/2017  . CKD (chronic kidney disease), stage V ([place]) 01/06/2016  . CKD (chronic kidney disease), stage III ([place])   . Type 2 diabetes, uncontrolled, with neuropathy ([place])   . Marijuana abuse   . Tobacco abuse     . Essential hypertension, malignant   . Chronic kidney disease, stage IV (severe) ([place])   . Legally blind 10/13/2015  . Cocaine use disorder, moderate, dependence ([place]) 07/02/2015  . Accelerated hypertension   . Uncontrolled type 2 diabetes mellitus with ketoacidosis without coma ([place])   . Cocaine abuse ([place])   . Hypertensive emergency 06/06/2015  . Chronic respiratory failure ([place])   . Pulmonary nodule 05/22/2015  . Morbid obesity with BMI of 40.0-44.9, adult ([place]) 03/26/2015  . Vision loss, bilateral 02/22/2015  . Nicotine dependence, cigarettes, with other nicotine-induced disorders 02/22/2015  . Thrombocytopenia ([place]) 12/04/2013  . Diastolic heart failure secondary to hypertension ([place]) 11/30/2013  . Radiculopathy with lower extremity symptoms 11/28/2013  . Depression 06/16/2013  . Chronic recurrent major depressive disorder ([place]) 06/16/2013  . Proliferative diabetic retinopathy ([place]) 06/08/2013  . Diabetic macular edema of left eye, with cataract, associated with type 2 diabetes mellitus ([place]) 06/08/2013  . Meibomianitis 06/08/2013  . Erectile dysfunction associated with type 2 diabetes mellitus ([place]) 04/22/2013  . Decreased peripheral vision of left eye 04/12/2013  . Plantar fasciitis of right foot 04/12/2013  . Obstructive sleep apnea 02/02/2013  . Diabetic peripheral neuropathy associated with type 2 diabetes mellitus ([place]) 12/27/2012  . Abnormality of gait 12/06/2012  . Family history of malignant neoplasm of gastrointestinal tract 10/10/2012  . Personal history of colonic polyps 10/10/2012  . Skin tag 08/18/2012  . Chronic low back pain 07/15/2012  . Preventative health care 06/02/2012  . Hyperlipemia 05/12/2012  .  Hypertension 05/12/2012  . Hemorrhoids 05/12/2012  . Blood per rectum 05/12/2012  . Meralgia paraesthetica 05/03/2012  . Lumbar spondylosis 05/03/2012  . Lumbar spinal stenosis 05/03/2012  . Insulin dependent type 2 diabetes mellitus, uncontrolled ([place])  05/13/1999   Past Medical History:  Diagnosis Date  . Acute diastolic heart failure ([place])   . CHF (congestive heart failure) ([place])   . Diabetes mellitus with nephropathy ([place]) 05/12/2012   Dr. Justin Mend follows  . Hemorrhoids 05/12/2012  . Hyperlipidemia   . Hypertension   . Lumbar spinal stenosis 05/03/2012   Mild with only right L4 nerve root encroachment, no neurogenic claudication   . Lumbar spondylosis 05/03/2012  . Meralgia paraesthetica 05/03/2012  . Meralgia paraesthetica 05/03/2012   On Lyrica which does improve pain.   Marland Kitchen Neuropathy in diabetes ([place]) 05/12/2012  . Obesity   . Pneumonia   . Retinopathy   . Sleep apnea    uses cpap  . Substance abuse ([place])   . Urinary incontinence 05/12/2012   Since the start of Sept. 2013    Past Surgical History:  Procedure Laterality Date  . ABDOMINAL SURGERY     Abscess I&D 2/2 infected hair  . AV FISTULA PLACEMENT Left 02/06/2016   Procedure: LEFT ARM RADIOCEPHALIC ARTERIOVENOUS (AV) FISTULA CREATION;  Surgeon: Rosetta Posner, MD;  Location: [facility];  Service: Vascular;  Laterality: Left;  . COLONOSCOPY W/ POLYPECTOMY     pt to bring records  . COLONOSCOPY WITH PROPOFOL N/A 06/05/2016   Procedure: COLONOSCOPY WITH PROPOFOL;  Surgeon: Doran Stabler, MD;  Location: [facility];  Service: Gastroenterology;  Laterality: N/A;  . TONSILLECTOMY     No Known Allergies Prior to Admission medications   Medication Sig Start Date End Date Taking? Authorizing Provider  ACCU-CHEK AVIVA PLUS test strip USE AS DIRECTED TO CHECK BLOOD SUGAR 5 TIMES A DAY BEFORE MEALS AND AS NEEDED. 09/16/18  Yes Stallings, Zoe A, MD  ACCU-CHEK SOFTCLIX LANCETS lancets USE AS DIRECTED TO CHECK BLOOD SUGAR 5 TIMES A DAY BEFORE MEALS AND AS NEEDED. 08/30/18  Yes Delia Chimes A, MD  aspirin EC 81 MG tablet Take 1 tablet (81 mg total) by mouth daily. 02/08/17  Yes Fay Records, MD  atorvastatin (LIPITOR) 80 MG tablet Take 1 tablet (80 mg total) by mouth daily at 6 PM. Patient  taking differently: Take 80 mg by mouth daily.  08/18/18  Yes Forrest Moron, MD  carvedilol (COREG) 25 MG tablet Take 1 tablet (25 mg total) by mouth 2 (two) times daily. 08/18/18  Yes Stallings, Zoe A, MD  cetirizine (ZYRTEC) 10 MG tablet TAKE ONE TABLET BY MOUTH ONCE DAILY FOR ALLERGIES Patient taking differently: Take 10 mg by mouth daily. TAKE ONE TABLET BY MOUTH ONCE DAILY FOR ALLERGIES 08/18/18  Yes Stallings, Zoe A, MD  clobetasol cream (TEMOVATE) 0.05 % APPLY TO AFFECED ARES(S) TOPICALLY TWICE DAILY Patient taking differently: Apply 1 application topically 2 (two) times daily. APPLY TO AFFECED ARES(S) TOPICALLY TWICE DAILY 08/18/18  Yes Stallings, Zoe A, MD  ferrous sulfate 325 (65 FE) MG tablet Take 1 tablet (325 mg total) by mouth daily. 08/18/18  Yes Stallings, Zoe A, MD  fluticasone (FLONASE) 50 MCG/ACT nasal spray USE TWO SPRAYS INTO EACH NOSTRILS DAILY Patient taking differently: Place 2 sprays into both nostrils daily. USE TWO SPRAYS INTO EACH NOSTRILS DAILY 08/18/18  Yes Stallings, Zoe A, MD  furosemide (LASIX) 80 MG tablet Take 80-160 mg by mouth 2 (two) times daily. 2  Tablets in the AM and 1 Tablet in the PM    Yes [provider]  gabapentin (NEURONTIN) 300 MG capsule Take 300 mg by mouth at bedtime.   Yes [provider]  hydrALAZINE (APRESOLINE) 100 MG tablet TAKE ONE TABLET BY MOUTH THREE TIMES A DAY. Patient taking differently: Take 100 mg by mouth 3 (three) times daily.  07/21/18  Yes Stallings, Zoe A, MD  Insulin Glargine (LANTUS SOLOSTAR) 100 UNIT/ML Solostar Pen Inject 30 Units into the skin daily. INJECT 30 UNITS UNDER THE SKIN EVERY MORNING AND INJECT 15 UNITS EVERY NIGHT Patient taking differently: Inject 35 Units into the skin daily.  08/22/18  Yes Shamleffer, Melanie Crazier, MD  insulin lispro (HUMALOG KWIKPEN) 100 UNIT/ML KwikPen Inject 0.1 mLs (10 Units total) into the skin 3 (three) times daily. 08/22/18  Yes Shamleffer, Melanie Crazier, MD    Insulin Pen Needle (B-D UF III MINI PEN NEEDLES) 31G X 5 MM MISC Four times daily 08/22/18  Yes Shamleffer, Melanie Crazier, MD  Melatonin 5 MG TABS Take 1 tablet (5 mg total) by mouth at bedtime. 08/18/18  Yes Stallings, Zoe A, MD  metolazone (ZAROXOLYN) 5 MG tablet Take 1 tablet (5 mg total) by mouth daily. 06/13/17  Yes Forrest Moron, MD  multivitamin-iron-minerals-folic acid (CENTRUM) chewable tablet Chew 1 tablet by mouth daily.   Yes [provider]  oxycodone-acetaminophen (LYNOX) 10-300 MG tablet Take 1 tablet by mouth every 4 (four) hours as needed for pain.   Yes [provider]  Potassium Chloride ER 20 MEQ TBCR Take 20 mEq by mouth 2 (two) times daily. 10/08/17  Yes Stallings, Zoe A, MD  verapamil (VERELAN PM) 180 MG 24 hr capsule Take 1 capsule (180 mg total) by mouth at bedtime. Patient taking differently: Take 360 mg by mouth at bedtime.  08/18/18  Yes Forrest Moron, MD   Social History   Socioeconomic History  . Marital status: Single    Spouse name: Not on file  . Number of children: Not on file  . Years of education: Not on file  . Highest education level: Not on file  Occupational History  . Occupation: dietary services    Employer: [employer]  Social Needs  . Financial resource strain: Not on file  . Food insecurity:    Worry: Not on file    Inability: Not on file  . Transportation needs:    Medical: Not on file    Non-medical: Not on file  Tobacco Use  . Smoking status: Current Every Day Smoker    Packs/day: 0.50    Years: 30.00    Pack years: 15.00    Types: Cigarettes  . Smokeless tobacco: Never Used  . Tobacco comment: "Trying - hard to stop"  Substance and Sexual Activity  . Alcohol use: No    Comment: " about 40 ounce beer per month."  down to a beer every 3 weeks  . Drug use: No    Types: Marijuana, Cocaine    Comment: marijuana "laced with something"; today, stated "no" to question of illegal drug use 06/16/2016  . Sexual  activity: Not on file  Lifestyle  . Physical activity:    Days per week: Not on file    Minutes per session: Not on file  . Stress: Not on file  Relationships  . Social connections:    Talks on phone: Not on file    Gets together: Not on file    Attends religious service: Not  on file    Active member of club or organization: Not on file    Attends meetings of clubs or organizations: Not on file    Relationship status: Not on file  . Intimate partner violence:    Fear of current or ex partner: Not on file    Emotionally abused: Not on file    Physically abused: Not on file    Forced sexual activity: Not on file  Other Topics Concern  . Not on file  Social History Narrative  . Not on file    Review of Systems Per HPI    Objective:   Physical Exam Constitutional:      General: He is not in acute distress.    Appearance: He is well-developed.  HENT:     Head: Normocephalic and atraumatic.  Cardiovascular:     Rate and Rhythm: Normal rate.  Pulmonary:     Effort: Pulmonary effort is normal.  Abdominal:     General: Bowel sounds are normal. There is no distension.     Tenderness: There is abdominal tenderness (diffuse, no focal ttp. ). There is no rebound.  Genitourinary:    Rectum: Normal anal tone.     Comments: No fecal impaction appreciated with DRE. No stool in vault.  Neurological:     Mental Status: He is alert and oriented to person, place, and time.    Vitals:   10/18/18 0811  BP: 134/76  Pulse: 68  Resp: 16  Temp: 98.4 F (36.9 C)  TempSrc: Oral  SpO2: 96%  Weight: 261 lb 9.6 oz (118.7 kg)  Height: 5\' 10"  (1.778 m)   Dg Abd 1 View  Result Date: 10/18/2018 CLINICAL DATA:  Constipation, abdominal discomfort EXAM: ABDOMEN - 1 VIEW COMPARISON:  None. FINDINGS: A few mildly dilated small bowel loops in the right abdomen measuring up to the 3.3 cm diameter. Large amount of stool throughout the colon. No evidence of pneumatosis or pneumoperitoneum. No  radiopaque nephrolithiasis. Clear lung bases. IMPRESSION: A few mildly dilated small bowel loops in the right abdomen, nonspecific, cannot exclude mild ileus or mild distal small bowel obstruction. Correlation with CT abdomen/pelvis with oral and IV contrast may be obtained as clinically warranted. Large colonic stool volume, compatible with the reported history of constipation. These results will be called to the ordering clinician or representative by the Radiology Department at the imaging location. Electronically Signed   By: Ilona Sorrel M.D.   On: 10/18/2018 09:35      Assessment & Plan:    Rameen Quinney is a 53 y.o. male Drug-induced constipation - Plan: DG Abd 1 View, glycerin adult 2 g suppository, polyethylene glycol powder (GLYCOLAX/MIRALAX) powder  Abdominal pain, unspecified abdominal location - Plan: polyethylene glycol powder (GLYCOLAX/MIRALAX) powder  Stage 5 chronic kidney disease not on chronic dialysis (HCC) Chronic constipation, likely worsened recently with start of narcotic pain medication and cessation of previous senna/docusate.  Did report some generalized abdominal discomfort, but no rebound/guarding or focal pain.  Denies vomiting.  Suspected constipation as primary contributor.  X-ray noted as above, less likely true SBO.  History of stage V chronic kidney disease, with upcoming follow-up with nephrologist.  No fecal impaction appreciated on DRE.  -For some suppositories if needed for more impacted symptoms, as could be high impaction.  Potential for repeat manual disimpaction, soapsuds or warm water enema may be needed for significant impaction  -MiraLAX twice daily for now, but may need lactulose if not improving   -Has  planned follow-up with nephrologist in 2 days.  Can recheck status at that time, but RTC/ER precautions if worsening sooner   Meds ordered this encounter  Medications  . glycerin adult 2 g suppository    Sig: Place 1 suppository rectally as needed for  constipation.    Dispense:  12 suppository    Refill:  0  . polyethylene glycol powder (GLYCOLAX/MIRALAX) powder    Sig: Take 17 g by mouth 2 (two) times daily as needed.    Dispense:  3350 g    Refill:  1   Patient Instructions     I suspect constipation worsened off of previous medication and with the addition of oxycodone. For now can try MiraLAX twice per day until more normal bowel movements, then can once per day.  Glycerin suppository if needed for hard stool in the rectum.  Keep follow-up with nephrologist in 2 days to discuss other treatments for constipation.  Make sure to drink sufficient fluid and fiber in the diet.  See information below.  Return to the clinic or go to the nearest emergency room if any of your symptoms worsen or new symptoms occur.    Constipation, Adult Constipation is when a person has fewer bowel movements in a week than normal, has difficulty having a bowel movement, or has stools that are dry, hard, or larger than normal. Constipation may be caused by an underlying condition. It may become worse with age if a person takes certain medicines and does not take in enough fluids. Follow these instructions at home: Eating and drinking   Eat foods that have a lot of fiber, such as fresh fruits and vegetables, whole grains, and beans.  Limit foods that are high in fat, low in fiber, or overly processed, such as french fries, hamburgers, cookies, candies, and soda.  Drink enough fluid to keep your urine clear or pale yellow. General instructions  Exercise regularly or as told by your health care provider.  Go to the restroom when you have the urge to go. Do not hold it in.  Take over-the-counter and prescription medicines only as told by your health care provider. These include any fiber supplements.  Practice pelvic floor retraining exercises, such as deep breathing while relaxing the lower abdomen and pelvic floor relaxation during bowel  movements.  Watch your condition for any changes.  Keep all follow-up visits as told by your health care provider. This is important. Contact a health care provider if:  You have pain that gets worse.  You have a fever.  You do not have a bowel movement after 4 days.  You vomit.  You are not hungry.  You lose weight.  You are bleeding from the anus.  You have thin, pencil-like stools. Get help right away if:  You have a fever and your symptoms suddenly get worse.  You leak stool or have blood in your stool.  Your abdomen is bloated.  You have severe pain in your abdomen.  You feel dizzy or you faint. This information is not intended to replace advice given to you by your health care provider. Make sure you discuss any questions you have with your health care provider. Document Released: 05/15/2004 Document Revised: 03/06/2016 Document Reviewed: 02/05/2016 Elsevier Interactive Patient Education  [employer].  If you have lab work done today you will be contacted with your lab results within the next 2 weeks.  If you have not heard from [place] then please contact us. The fastest  way to get your results is to register for My Chart.   IF you received an x-ray today, you will receive an invoice from [facility]. Please contact [facility] at [phone] with questions or concerns regarding your invoice.   IF you received labwork today, you will receive an invoice from [facility]. Please contact [facility] at [phone] with questions or concerns regarding your invoice.   Our billing staff will not be able to assist you with questions regarding bills from these companies.  You will be contacted with the lab results as soon as they are available. The fastest way to get your results is to activate your My Chart account. Instructions are located on the last page of this paperwork. If you have not heard from [place] regarding the results in 2 weeks, please contact  this office.

## 2018-10-20 ENCOUNTER — Other Ambulatory Visit: Payer: Self-pay

## 2018-10-20 ENCOUNTER — Encounter: Payer: Self-pay | Admitting: Internal Medicine

## 2018-10-20 ENCOUNTER — Ambulatory Visit (INDEPENDENT_AMBULATORY_CARE_PROVIDER_SITE_OTHER): Payer: Medicare Other | Admitting: Internal Medicine

## 2018-10-20 ENCOUNTER — Ambulatory Visit: Payer: Self-pay | Admitting: Internal Medicine

## 2018-10-20 VITALS — BP 140/82 | HR 64 | Resp 16 | Ht 70.0 in | Wt 261.8 lb

## 2018-10-20 DIAGNOSIS — Z794 Long term (current) use of insulin: Secondary | ICD-10-CM | POA: Diagnosis not present

## 2018-10-20 DIAGNOSIS — E113593 Type 2 diabetes mellitus with proliferative diabetic retinopathy without macular edema, bilateral: Secondary | ICD-10-CM

## 2018-10-20 DIAGNOSIS — N185 Chronic kidney disease, stage 5: Secondary | ICD-10-CM | POA: Diagnosis not present

## 2018-10-20 DIAGNOSIS — E1122 Type 2 diabetes mellitus with diabetic chronic kidney disease: Secondary | ICD-10-CM

## 2018-10-20 DIAGNOSIS — N184 Chronic kidney disease, stage 4 (severe): Secondary | ICD-10-CM

## 2018-10-20 DIAGNOSIS — E1165 Type 2 diabetes mellitus with hyperglycemia: Secondary | ICD-10-CM

## 2018-10-20 DIAGNOSIS — I120 Hypertensive chronic kidney disease with stage 5 chronic kidney disease or end stage renal disease: Secondary | ICD-10-CM | POA: Diagnosis not present

## 2018-10-20 DIAGNOSIS — E1142 Type 2 diabetes mellitus with diabetic polyneuropathy: Secondary | ICD-10-CM

## 2018-10-20 DIAGNOSIS — N2581 Secondary hyperparathyroidism of renal origin: Secondary | ICD-10-CM | POA: Diagnosis not present

## 2018-10-20 DIAGNOSIS — D631 Anemia in chronic kidney disease: Secondary | ICD-10-CM | POA: Diagnosis not present

## 2018-10-20 DIAGNOSIS — IMO0002 Reserved for concepts with insufficient information to code with codable children: Secondary | ICD-10-CM

## 2018-10-20 MED ORDER — INSULIN GLARGINE 100 UNIT/ML SOLOSTAR PEN: 45.0000 [IU] | PEN_INJECTOR | Freq: Every day | SUBCUTANEOUS | 6 refills | Status: DC

## 2018-10-20 MED ORDER — INSULIN LISPRO (1 UNIT DIAL) 100 UNIT/ML (KWIKPEN): 8.0000 [IU] | PEN_INJECTOR | Freq: Three times a day (TID) | SUBCUTANEOUS | 6 refills | Status: DC

## 2018-10-20 NOTE — Patient Instructions (Addendum)
-   Continue Lantus at 45 units  - Decrease Humalog to 8 units with meals  - For a snack take 2 units of Humalog   Humalog correctional insulin: ADD extra units on insulin to your meal-time Humalog dose if your blood sugars are higher than 190. Use the scale below to help guide you:   Blood sugar before meal Number of units to inject  Less than 190 0 unit  191 -  230 1 units  231 -  270 2 units  271 -  310 3 units

## 2018-10-20 NOTE — Progress Notes (Signed)
Name: Jerry Cantrell  Age/ Sex: 53 y.o., male   MRN/ DOB: [id number], October 15, 1965     PCP: Forrest Moron, MD   Reason for Endocrinology Evaluation: Type 2 Diabetes Mellitus  Initial Endocrine Consultative Visit: 08/22/2018    PATIENT IDENTIFIER: [name] is a 53 y.o. male with a past medical history of HTN, T2DM, dyslipidemia and CKD V, OSA on CPAP . The patient has followed with Endocrinology clinic since 08/22/2018 for consultative assistance with management of his diabetes.  DIABETIC HISTORY:  Mr. Vastine was diagnosed with T2DM ~ in 2004, he does not recall oral glycemic agents that he has tried in the past.He has been on insulin therapy for years. His hemoglobin A1c has ranged from 7.0 % in 01/2018 peaking at 9.1%in 2018   SUBJECTIVE:   During the last visit (08/22/18): His A1c was 5.9% due to recurrent hypoglycemia, will adjust his Lantus to 30 units once a day and started a standing dose of Humalog between 5 to 10 units with each meal, rather than a sliding scale.  Today (10/20/2018): Mr. Stuber is a here for 64-month follow-up on his diabetes management.  He checks his blood sugars 4 times daily. The patient has had hypoglycemic episodes since the last clinic visit, which typically occur  1 xweek - most often occurring during the day. The patient is symptomatic with these episodes (used to not be symptoms tic), with symptoms of shaky. Otherwise, the patient has not required any recent emergency interventions for hypoglycemia and has had recent hospitalizations on September 17, 2022 upper extremity cellulitis.    ROS: As per HPI and as detailed below: Review of Systems  Constitutional: Positive for malaise/fatigue.  Respiratory: Negative for cough and shortness of breath.   Cardiovascular: Negative for chest pain and palpitations.  Gastrointestinal: Positive for nausea.      HOME DIABETES  REGIMEN:  Lantus 45 units daily  Humalog 10 units with meals, then he may take 2-5 units 2 hours after a meal if its high.   Statin: Yes ACE-I/ARB: Yes    METER DOWNLOAD SUMMARY: Date range evaluated: 1/22- 10/20/2018 Fingerstick Blood Glucose Tests = 110 Average Number Tests/Day = 3.7 Overall Mean FS Glucose = 152 Standard Deviation = 66.3  BG Ranges: Low = 34 High = 412   Hypoglycemic Events/30 Days: BG < 50 = 2 Episodes of symptomatic severe hypoglycemia =no    DIABETIC COMPLICATIONS: Microvascular complications:   CKD, neuropathy , B/L retinopathy, S/P injections   Last eye exam: Completed 07/2018   Macrovascular complications:    Denies: CAD, PVD, CVA   HISTORY:  Past Medical History:  Past Medical History:  Diagnosis Date  . Acute diastolic heart failure ([place])   . CHF (congestive heart failure) ([place])   . Diabetes mellitus with nephropathy ([place]) 05/12/2012   Dr. Justin Mend follows  . Hemorrhoids 05/12/2012  . Hyperlipidemia   . Hypertension   . Lumbar spinal stenosis 05/03/2012   Mild with only right L4 nerve root encroachment, no neurogenic claudication   . Lumbar spondylosis 05/03/2012  . Meralgia paraesthetica 05/03/2012  . Meralgia paraesthetica 05/03/2012   On Lyrica which does improve pain.   Marland Kitchen Neuropathy in diabetes ([place]) 05/12/2012  . Obesity   . Pneumonia   . Retinopathy   . Sleep apnea    uses cpap  . Substance abuse ([place])   . Urinary incontinence 05/12/2012   Since the start of Sept. 2013    Past Surgical History:  Past Surgical  History:  Procedure Laterality Date  . ABDOMINAL SURGERY     Abscess I&D 2/2 infected hair  . AV FISTULA PLACEMENT Left 02/06/2016   Procedure: LEFT ARM RADIOCEPHALIC ARTERIOVENOUS (AV) FISTULA CREATION;  Surgeon: Rosetta Posner, MD;  Location: [facility];  Service: Vascular;  Laterality: Left;  . COLONOSCOPY W/ POLYPECTOMY     pt to bring records  . COLONOSCOPY WITH PROPOFOL N/A 06/05/2016   Procedure: COLONOSCOPY WITH  PROPOFOL;  Surgeon: Doran Stabler, MD;  Location: [facility];  Service: Gastroenterology;  Laterality: N/A;  . TONSILLECTOMY      Social History:  reports that he has been smoking cigarettes. He has a 15.00 pack-year smoking history. He has never used smokeless tobacco. He reports that he does not drink alcohol or use drugs. Family History:  Family History  Problem Relation Age of Onset  . Prostate cancer Father   . Alcohol abuse Father   . Emphysema Father        smoked  . Colon cancer Neg Hx      HOME MEDICATIONS: Allergies as of 10/20/2018   No Known Allergies     Medication List       Accurate as of October 20, 2018 11:19 AM. Always use your most recent med list.        ACCU-CHEK AVIVA PLUS test strip Generic drug:  glucose blood USE AS DIRECTED TO CHECK BLOOD SUGAR 5 TIMES A DAY BEFORE MEALS AND AS NEEDED.   ACCU-CHEK SOFTCLIX LANCETS lancets USE AS DIRECTED TO CHECK BLOOD SUGAR 5 TIMES A DAY BEFORE MEALS AND AS NEEDED.   atorvastatin 80 MG tablet Commonly known as:  LIPITOR Take 1 tablet (80 mg total) by mouth daily at 6 PM.   carvedilol 25 MG tablet Commonly known as:  COREG Take 1 tablet (25 mg total) by mouth 2 (two) times daily.   cetirizine 10 MG tablet Commonly known as:  ZYRTEC TAKE ONE TABLET BY MOUTH ONCE DAILY FOR ALLERGIES   clobetasol cream 0.05 % Commonly known as:  TEMOVATE APPLY TO AFFECED ARES(S) TOPICALLY TWICE DAILY   ferrous sulfate 325 (65 FE) MG tablet Take 1 tablet (325 mg total) by mouth daily.   fluticasone 50 MCG/ACT nasal spray Commonly known as:  FLONASE USE TWO SPRAYS INTO EACH NOSTRILS DAILY   furosemide 80 MG tablet Commonly known as:  LASIX Take 80-160 mg by mouth 2 (two) times daily. 2 Tablets in the AM and 1 Tablet in the PM   glycerin adult 2 g suppository Place 1 suppository rectally as needed for constipation.   hydrALAZINE 100 MG tablet Commonly known as:  APRESOLINE TAKE ONE TABLET BY MOUTH THREE  TIMES A DAY.   Insulin Glargine 100 UNIT/ML Solostar Pen Commonly known as:  LANTUS SOLOSTAR Inject 30 Units into the skin daily. INJECT 30 UNITS UNDER THE SKIN EVERY MORNING AND INJECT 15 UNITS EVERY NIGHT   insulin lispro 100 UNIT/ML KwikPen Commonly known as:  HUMALOG KWIKPEN Inject 0.1 mLs (10 Units total) into the skin 3 (three) times daily.   Insulin Pen Needle 31G X 5 MM Misc Commonly known as:  B-D UF III MINI PEN NEEDLES Four times daily   metolazone 5 MG tablet Commonly known as:  ZAROXOLYN Take 1 tablet (5 mg total) by mouth daily.   multivitamin-iron-minerals-folic acid chewable tablet Chew 1 tablet by mouth daily.   oxycodone-acetaminophen 10-300 MG tablet Commonly known as:  LYNOX Take 1 tablet by mouth every 4 (four) hours  as needed for pain.   polyethylene glycol powder powder Commonly known as:  GLYCOLAX/MIRALAX Take 17 g by mouth 2 (two) times daily as needed.   Potassium Chloride ER 20 MEQ Tbcr Take 20 mEq by mouth 2 (two) times daily.   pregabalin 75 MG capsule Commonly known as:  LYRICA Take 75 mg by mouth 3 (three) times daily.   verapamil 180 MG 24 hr capsule Commonly known as:  VERELAN PM Take 1 capsule (180 mg total) by mouth at bedtime.        OBJECTIVE:   Vital Signs: BP 140/82 (BP Location: Right Arm, Patient Position: Sitting, Cuff Size: Normal)   Pulse 64   Resp 16   Ht 5\' 10"  (1.778 m)   Wt 261 lb 12.8 oz (118.8 kg)   SpO2 96%   BMI 37.56 kg/m   Wt Readings from Last 3 Encounters:  10/20/18 261 lb 12.8 oz (118.8 kg)  10/18/18 261 lb 9.6 oz (118.7 kg)  09/26/18 255 lb (115.7 kg)     Exam: General: Pt appears well and is in NAD  Neck: General: Supple without adenopathy. Thyroid: Thyroid size normal.  No goiter or nodules appreciated. No thyroid bruit.  Lungs: Clear with good BS bilat with no rales, rhonchi, or wheezes  Heart: RRR with normal S1 and S2 and no gallops; no murmurs; no rub  Abdomen: Normoactive bowel sounds,  soft, nontender, without masses or organomegaly palpable  Extremities: No pretibial edema. No tremor.   Skin: Normal texture and temperature to palpation. No rash noted. No Acanthosis nigricans/skin tags.   Neuro: MS is good with appropriate affect, pt is alert and Ox3    DM foot exam:08/22/18 The skin of the feet is intact without sores or ulcerations. Pt with b/l plantar callous formation  The pedal pulses are 2+ on right and 2+ on left. The sensation is intact to a screening 5.07, 10 gram monofilament bilaterally       DATA REVIEWED:  Lab Results  Component Value Date   HGBA1C 5.9 (A) 08/18/2018   HGBA1C 7.0 (A) 02/14/2018   HGBA1C 7.7 11/11/2017   Lab Results  Component Value Date   MICROALBUR 72.5 06/16/2016   LDLCALC 69 05/10/2017   CREATININE 8.11 (H) 09/20/2018   Lab Results  Component Value Date   MICRALBCREAT 1,926.1 (H) 06/16/2013    No results found for: FRUCTOSAMINE   Lab Results  Component Value Date   CHOL 175 05/10/2017   HDL 35 (L) 05/10/2017   LDLCALC 69 05/10/2017   LDLDIRECT 99 09/16/2010   TRIG 353 (H) 05/10/2017   CHOLHDL 5.0 05/10/2017         ASSESSMENT / PLAN / RECOMMENDATIONS:   1) Type 2 Diabetes Mellitus, poorly controlled, With Retinopathy, CKD IV, and neuropathic complications, with improved hypoglycemia awareness - Most recent A1c of 5.9 %. Goal A1c < 7.5 %.    Plan: - Mr. Greis is not a safe insulin user, he has increased his lantus dose on his own from 30  Units to 45 units, he states that he takes humalog at meal time but also checks his glucose 2- hrs post meal and takes humalog if its not to his liking, when asked how much he gives, he guess between 2-5 units, he also snacks and guess on how much to take with his snack. For example last night his BG was 188 mg/dL at bedtime, pt did not want to go to bed with such number and took 4 units  of humalog and this am it was 45 mg/dL.  - Another example, he had a BG of 34 during the  evening one time, this was explained by he took 10 units of Humalog and rode in the car with his partner to go to a restaurant but somehow didn't go and didn;t eat supper and by the time he got back home it was in the 30's.   - I discussed with him again that insulin and hypoglycemia could cause death. He was strongly discouraged from guessing or adjusting his insulin on his own, he was strongly discouraged in taking Humalog without eating , I also explained to him due to his CKDV he is at a higher risk for hypoglycemia, because insulin lasts in his system longer.  - I believe his lantus of 45 units is too much for him, but its hard to figure our when he takes humalog at bedtime as well.  - On a positive note, he is despite it all is having less hypoglycemic episodes then he used to, and he is more aware of them, but in the past he used to not feel them at all .  - To curb his guessing with humalog dosing, he will be provided with snack doses and correctional scale to be used in addition to his meal humalog dose.   MEDICATIONS:  Continue Lantus 45 units QAM  Decrease Humalog to 8 units with meals    For a snack take 2 units of Humalog   Humalog correctional insulin: ADD extra units on insulin to your meal-time Humalog dose if your blood sugars are higher than 190. Use the scale below to help guide you:   Blood sugar before meal Number of units to inject  Less than 190 0 unit  191 -  230 1 units  231 -  270 2 units  271 -  310 3 units    EDUCATION / INSTRUCTIONS:  BG monitoring instructions: Patient is instructed to check his blood sugars 4 times a day, fasting and bedtime.  Call [place] [facility] clinic if: BG persistently < 70 or > 300. . I reviewed the Rule of 15 for the treatment of hypoglycemia in detail with the patient. Literature supplied.   2) Diabetic complications:   Eye: Does  have known diabetic retinopathy.   Neuro/ Feet: Does  have known diabetic peripheral  neuropathy .   Renal: Patient does  have known baseline CKD.    3) Lipids: Patient is on a statin.  4) Hypertension: He is at goal of < 140/90 mmHg.    F/U in 3 months    Signed electronically by: Mack Guise, MD  [facility]  [facility] [address] [place], [place] [address] Phone: [phone] FAX: [phone]   CC: Forrest Moron, MD [place] [address] Phone: [phone]  Fax: [phone]  Return to Endocrinology clinic as below: Future Appointments  Date Time Provider [place]  10/29/2018 11:00 AM Forrest Moron, MD PCP-PCP [facility]  11/03/2018 10:45 AM Evelina Bucy, DPM [facility]  11/28/2018 10:20 AM Fay Records, MD [facility]  01/06/2019  2:00 PM Parrett, Fonnie Mu, NP [facility] None  01/18/2019 10:50 AM Milia Warth, Melanie Crazier, MD [facility] None

## 2018-10-22 ENCOUNTER — Telehealth: Payer: Self-pay | Admitting: Family Medicine

## 2018-10-22 NOTE — Telephone Encounter (Signed)
Called to check status since OV on Tuesday. Left message to call back with update.

## 2018-10-28 DIAGNOSIS — R0602 Shortness of breath: Secondary | ICD-10-CM | POA: Diagnosis not present

## 2018-10-28 DIAGNOSIS — G4733 Obstructive sleep apnea (adult) (pediatric): Secondary | ICD-10-CM | POA: Diagnosis not present

## 2018-10-28 DIAGNOSIS — I509 Heart failure, unspecified: Secondary | ICD-10-CM | POA: Diagnosis not present

## 2018-10-29 ENCOUNTER — Ambulatory Visit: Payer: Medicare Other | Admitting: Family Medicine

## 2018-10-31 DIAGNOSIS — E113512 Type 2 diabetes mellitus with proliferative diabetic retinopathy with macular edema, left eye: Secondary | ICD-10-CM | POA: Diagnosis not present

## 2018-11-03 ENCOUNTER — Ambulatory Visit (INDEPENDENT_AMBULATORY_CARE_PROVIDER_SITE_OTHER): Payer: Medicare Other | Admitting: Podiatry

## 2018-11-03 ENCOUNTER — Encounter: Payer: Self-pay | Admitting: Podiatry

## 2018-11-03 DIAGNOSIS — M2041 Other hammer toe(s) (acquired), right foot: Secondary | ICD-10-CM | POA: Diagnosis not present

## 2018-11-03 DIAGNOSIS — B351 Tinea unguium: Secondary | ICD-10-CM | POA: Diagnosis not present

## 2018-11-03 DIAGNOSIS — Z794 Long term (current) use of insulin: Secondary | ICD-10-CM | POA: Diagnosis not present

## 2018-11-03 DIAGNOSIS — M2022 Hallux rigidus, left foot: Secondary | ICD-10-CM | POA: Diagnosis not present

## 2018-11-03 DIAGNOSIS — M2042 Other hammer toe(s) (acquired), left foot: Secondary | ICD-10-CM | POA: Diagnosis not present

## 2018-11-03 DIAGNOSIS — N183 Chronic kidney disease, stage 3 unspecified: Secondary | ICD-10-CM

## 2018-11-03 DIAGNOSIS — E0822 Diabetes mellitus due to underlying condition with diabetic chronic kidney disease: Secondary | ICD-10-CM

## 2018-11-03 DIAGNOSIS — E1140 Type 2 diabetes mellitus with diabetic neuropathy, unspecified: Secondary | ICD-10-CM | POA: Diagnosis not present

## 2018-11-03 DIAGNOSIS — M2021 Hallux rigidus, right foot: Secondary | ICD-10-CM | POA: Diagnosis not present

## 2018-11-03 NOTE — Progress Notes (Addendum)
Subjective:  Patient ID: Jerry Cantrell, male    DOB: Jan 23, 1966,  MRN: [id number]  Chief Complaint  Patient presents with  . Nail Problem    routine debridement of painful nails    53 y.o. male presents  for diabetic foot care. Last AMBS was unkonwn.  Denies numbness and tingling in their feet. Denies cramping in legs and thighs.  Review of Systems: Negative except as noted in the HPI. Denies N/V/F/Ch.  Past Medical History:  Diagnosis Date  . Acute diastolic heart failure ([place])   . CHF (congestive heart failure) ([place])   . Diabetes mellitus with nephropathy ([place]) 05/12/2012   Dr. Justin Mend follows  . Hemorrhoids 05/12/2012  . Hyperlipidemia   . Hypertension   . Lumbar spinal stenosis 05/03/2012   Mild with only right L4 nerve root encroachment, no neurogenic claudication   . Lumbar spondylosis 05/03/2012  . Meralgia paraesthetica 05/03/2012  . Meralgia paraesthetica 05/03/2012   On Lyrica which does improve pain.   Marland Kitchen Neuropathy in diabetes ([place]) 05/12/2012  . Obesity   . Pneumonia   . Retinopathy   . Sleep apnea    uses cpap  . Substance abuse ([place])   . Urinary incontinence 05/12/2012   Since the start of Sept. 2013     Current Outpatient Medications:  .  ACCU-CHEK AVIVA PLUS test strip, USE AS DIRECTED TO CHECK BLOOD SUGAR 5 TIMES A DAY BEFORE MEALS AND AS NEEDED., Disp: 400 each, Rfl: 2 .  ACCU-CHEK SOFTCLIX LANCETS lancets, USE AS DIRECTED TO CHECK BLOOD SUGAR 5 TIMES A DAY BEFORE MEALS AND AS NEEDED., Disp: 400 each, Rfl: 2 .  atorvastatin (LIPITOR) 80 MG tablet, Take 1 tablet (80 mg total) by mouth daily at 6 PM. (Patient taking differently: Take 80 mg by mouth daily. ), Disp: 90 tablet, Rfl: 3 .  carvedilol (COREG) 25 MG tablet, Take 1 tablet (25 mg total) by mouth 2 (two) times daily., Disp: 60 tablet, Rfl: 6 .  cetirizine (ZYRTEC) 10 MG tablet, TAKE ONE TABLET BY MOUTH ONCE DAILY FOR ALLERGIES (Patient taking differently: Take 10 mg by mouth daily. TAKE ONE TABLET BY MOUTH ONCE  DAILY FOR ALLERGIES), Disp: 90 tablet, Rfl: 3 .  clobetasol cream (TEMOVATE) 0.05 %, APPLY TO AFFECED ARES(S) TOPICALLY TWICE DAILY (Patient taking differently: Apply 1 application topically 2 (two) times daily. APPLY TO AFFECED ARES(S) TOPICALLY TWICE DAILY), Disp: 45 g, Rfl: 6 .  ferrous sulfate 325 (65 FE) MG tablet, Take 1 tablet (325 mg total) by mouth daily., Disp: 90 tablet, Rfl: 1 .  fluticasone (FLONASE) 50 MCG/ACT nasal spray, USE TWO SPRAYS INTO EACH NOSTRILS DAILY (Patient taking differently: Place 2 sprays into both nostrils daily. USE TWO SPRAYS INTO EACH NOSTRILS DAILY), Disp: 16 g, Rfl: 6 .  furosemide (LASIX) 80 MG tablet, Take 80-160 mg by mouth 2 (two) times daily. 2 Tablets in the AM and 1 Tablet in the PM , Disp: , Rfl:  .  glycerin adult 2 g suppository, Place 1 suppository rectally as needed for constipation., Disp: 12 suppository, Rfl: 0 .  hydrALAZINE (APRESOLINE) 100 MG tablet, TAKE ONE TABLET BY MOUTH THREE TIMES A DAY. (Patient taking differently: Take 100 mg by mouth 3 (three) times daily. ), Disp: 90 tablet, Rfl: 0 .  Insulin Glargine (LANTUS SOLOSTAR) 100 UNIT/ML Solostar Pen, Inject 45 Units into the skin daily. INJECT 30 UNITS UNDER THE SKIN EVERY MORNING AND INJECT 15 UNITS EVERY NIGHT, Disp: 15 mL, Rfl: 6 .  insulin lispro (  HUMALOG KWIKPEN) 100 UNIT/ML KwikPen, Inject 0.08 mLs (8 Units total) into the skin 3 (three) times daily. Max daily dose of 40 units daily, Disp: 15 mL, Rfl: 6 .  Insulin Pen Needle (B-D UF III MINI PEN NEEDLES) 31G X 5 MM MISC, Four times daily, Disp: 150 each, Rfl: 6 .  metolazone (ZAROXOLYN) 5 MG tablet, Take 1 tablet (5 mg total) by mouth daily., Disp: 90 tablet, Rfl: 1 .  multivitamin-iron-minerals-folic acid (CENTRUM) chewable tablet, Chew 1 tablet by mouth daily., Disp: , Rfl:  .  oxycodone-acetaminophen (LYNOX) 10-300 MG tablet, Take 1 tablet by mouth every 4 (four) hours as needed for pain., Disp: , Rfl:  .  polyethylene glycol powder  (GLYCOLAX/MIRALAX) powder, Take 17 g by mouth 2 (two) times daily as needed., Disp: 3350 g, Rfl: 1 .  Potassium Chloride ER 20 MEQ TBCR, Take 20 mEq by mouth 2 (two) times daily., Disp: 180 tablet, Rfl: 2 .  pregabalin (LYRICA) 75 MG capsule, Take 75 mg by mouth 3 (three) times daily., Disp: , Rfl:  .  verapamil (VERELAN PM) 180 MG 24 hr capsule, Take 1 capsule (180 mg total) by mouth at bedtime. (Patient taking differently: Take 360 mg by mouth at bedtime. ), Disp: 90 capsule, Rfl: 3  Current Facility-Administered Medications:  .  triamcinolone acetonide (KENALOG) 10 MG/ML injection 10 mg, 10 mg, Other, Once, Landis Martins, DPM  Social History   Tobacco Use  Smoking Status Current Every Day Smoker  . Packs/day: 0.50  . Years: 30.00  . Pack years: 15.00  . Types: Cigarettes  Smokeless Tobacco Never Used  Tobacco Comment   "Trying - hard to stop"    No Known Allergies Objective:  There were no vitals filed for this visit. There is no height or weight on file to calculate BMI. Constitutional Well developed. Well nourished.  Vascular Dorsalis pedis pulses present 1+ bilaterally  Posterior tibial pulses absent bilaterally  Pedal hair growth diminished. Capillary refill normal to all digits.  No cyanosis or clubbing noted.  Neurologic Normal speech. Oriented to person, place, and time. Epicritic sensation to light touch grossly present bilaterally. Protective sensation with 5.07 monofilament  present bilaterally. Vibratory sensation present bilaterally.  Dermatologic Nails elongated, thickened, dystrophic. No open wounds. No skin lesions.  Orthopedic: Normal joint ROM without pain or crepitus bilaterally. HAV bilat Hammertoes bilat No bony tenderness.   Assessment:   1. Onychomycosis   2. Diabetes mellitus due to underlying condition with stage 3 chronic kidney disease, with long-term current use of insulin ([place])    Plan:  Patient was evaluated and treated and all  questions answered.  Diabetes with CKD, PAD Onychomycosis -Educated on diabetic footcare. Diabetic risk level 1 -Routine foot care as below -DM shoes dispensed.  Procedure: Nail Debridement Rationale: Patient meets criteria for routine foot care due to CKD, PAD Type of Debridement: manual, sharp debridement. Instrumentation: Nail nipper, rotary burr. Number of Nails: 10  Return in about 3 months (around 02/03/2019) for Diabetic Foot Care.

## 2018-11-04 ENCOUNTER — Ambulatory Visit: Payer: Medicare Other | Admitting: Podiatry

## 2018-11-10 ENCOUNTER — Telehealth: Payer: Self-pay | Admitting: Family Medicine

## 2018-11-10 DIAGNOSIS — Z79891 Long term (current) use of opiate analgesic: Secondary | ICD-10-CM | POA: Diagnosis not present

## 2018-11-10 DIAGNOSIS — M5431 Sciatica, right side: Secondary | ICD-10-CM | POA: Diagnosis not present

## 2018-11-10 DIAGNOSIS — G8929 Other chronic pain: Secondary | ICD-10-CM | POA: Diagnosis not present

## 2018-11-10 DIAGNOSIS — M5432 Sciatica, left side: Secondary | ICD-10-CM | POA: Diagnosis not present

## 2018-11-10 DIAGNOSIS — M545 Low back pain: Secondary | ICD-10-CM | POA: Diagnosis not present

## 2018-11-10 DIAGNOSIS — G894 Chronic pain syndrome: Secondary | ICD-10-CM | POA: Diagnosis not present

## 2018-11-10 NOTE — Telephone Encounter (Signed)
Please call pt concerning refills epic was down.

## 2018-11-11 NOTE — Telephone Encounter (Signed)
Pt calling to find out if his refills were sent for 90 days.  Pt states that he needs all of his medications and that whoever took his message yesterday wrote everything down.  Pt states that he is high risk for virus and will be staying in.  Pt states that if he doesn't hear back today he will be contacting the health department letting them know we are not caring for him properly.

## 2018-11-14 ENCOUNTER — Other Ambulatory Visit: Payer: Self-pay | Admitting: Family Medicine

## 2018-11-14 DIAGNOSIS — E119 Type 2 diabetes mellitus without complications: Secondary | ICD-10-CM | POA: Diagnosis not present

## 2018-11-14 NOTE — Telephone Encounter (Signed)
Copied from [place] [id number]. Topic: Quick Communication - Rx Refill/Question >> Nov 14, 2018  3:58 PM Loma [place] wrote: Medication: Harrie Jeans at [facility] Ph [phone] ext [phone] wants to know if Dr Nolon Rod could put pt on a 90 day supply of all medications while the corona virus is an issue. If it needs to be changed back once this is over that will be ok but all meds for now are requested at 90 days.  If this cannot be done to please contact the patient.

## 2018-11-14 NOTE — Telephone Encounter (Signed)
Called pt to follow-up about refill no answer, will try again later.

## 2018-11-15 NOTE — Telephone Encounter (Signed)
See attached request from pharmacy for Dr. Nolon Rod.

## 2018-11-15 NOTE — Telephone Encounter (Signed)
Pt returning call to [place].

## 2018-11-16 NOTE — Telephone Encounter (Signed)
Please see note below. 

## 2018-11-16 NOTE — Telephone Encounter (Signed)
Chuck calling from [employer] on status of this issue. Please advise at # given.

## 2018-11-17 ENCOUNTER — Other Ambulatory Visit: Payer: Self-pay

## 2018-11-17 DIAGNOSIS — R059 Cough, unspecified: Secondary | ICD-10-CM

## 2018-11-17 DIAGNOSIS — I10 Essential (primary) hypertension: Secondary | ICD-10-CM

## 2018-11-17 DIAGNOSIS — H60541 Acute eczematoid otitis externa, right ear: Secondary | ICD-10-CM

## 2018-11-17 DIAGNOSIS — H5213 Myopia, bilateral: Secondary | ICD-10-CM | POA: Diagnosis not present

## 2018-11-17 DIAGNOSIS — R05 Cough: Secondary | ICD-10-CM

## 2018-11-17 DIAGNOSIS — E083499 Diabetes mellitus due to underlying condition with severe nonproliferative diabetic retinopathy without macular edema, unspecified eye: Secondary | ICD-10-CM

## 2018-11-17 DIAGNOSIS — E0865 Diabetes mellitus due to underlying condition with hyperglycemia: Secondary | ICD-10-CM

## 2018-11-17 DIAGNOSIS — IMO0002 Reserved for concepts with insufficient information to code with codable children: Secondary | ICD-10-CM

## 2018-11-17 DIAGNOSIS — N184 Chronic kidney disease, stage 4 (severe): Secondary | ICD-10-CM

## 2018-11-17 DIAGNOSIS — Z794 Long term (current) use of insulin: Secondary | ICD-10-CM

## 2018-11-17 MED ORDER — FLUTICASONE PROPIONATE 50 MCG/ACT NA SUSP: NASAL | 6 refills | Status: DC

## 2018-11-17 MED ORDER — ATORVASTATIN CALCIUM 80 MG PO TABS: 80.0000 mg | ORAL_TABLET | Freq: Every day | ORAL | 0 refills | Status: AC

## 2018-11-17 MED ORDER — GLUCOSE BLOOD VI STRP: 1.0000 | ORAL_STRIP | Freq: Every day | 0 refills | Status: DC

## 2018-11-17 MED ORDER — HYDRALAZINE HCL 100 MG PO TABS: 100.0000 mg | ORAL_TABLET | Freq: Three times a day (TID) | ORAL | 0 refills | Status: DC

## 2018-11-17 MED ORDER — CETIRIZINE HCL 10 MG PO TABS: ORAL_TABLET | ORAL | 0 refills | Status: DC

## 2018-11-17 MED ORDER — CLOBETASOL PROPIONATE 0.05 % EX CREA: TOPICAL_CREAM | CUTANEOUS | 0 refills | Status: DC

## 2018-11-17 MED ORDER — CARVEDILOL 25 MG PO TABS: 25.0000 mg | ORAL_TABLET | Freq: Two times a day (BID) | ORAL | 0 refills | Status: DC

## 2018-11-17 MED ORDER — INSULIN PEN NEEDLE 31G X 5 MM MISC: 0 refills | Status: AC

## 2018-11-17 MED ORDER — ACCU-CHEK SOFTCLIX LANCETS MISC: 0 refills | Status: AC

## 2018-11-17 NOTE — Telephone Encounter (Signed)
Call from pt regarding refills.  Reviewed Rx list with patient.  Spoke with Dr. Nolon Rod. Refilling chronic meds for 90 supply when available.  Also ordered insulin pen needles as pt states he is reusing old needles and endo did not order any new needles.  Call to pt and confirmed that refills are sent.

## 2018-11-22 DIAGNOSIS — N2581 Secondary hyperparathyroidism of renal origin: Secondary | ICD-10-CM | POA: Diagnosis not present

## 2018-11-22 DIAGNOSIS — I10 Essential (primary) hypertension: Secondary | ICD-10-CM | POA: Diagnosis not present

## 2018-11-22 DIAGNOSIS — N185 Chronic kidney disease, stage 5: Secondary | ICD-10-CM | POA: Diagnosis not present

## 2018-11-22 DIAGNOSIS — D631 Anemia in chronic kidney disease: Secondary | ICD-10-CM | POA: Diagnosis not present

## 2018-11-22 DIAGNOSIS — I120 Hypertensive chronic kidney disease with stage 5 chronic kidney disease or end stage renal disease: Secondary | ICD-10-CM | POA: Diagnosis not present

## 2018-11-22 DIAGNOSIS — E1122 Type 2 diabetes mellitus with diabetic chronic kidney disease: Secondary | ICD-10-CM | POA: Diagnosis not present

## 2018-11-23 ENCOUNTER — Telehealth: Payer: Self-pay | Admitting: Internal Medicine

## 2018-11-23 DIAGNOSIS — G8929 Other chronic pain: Secondary | ICD-10-CM | POA: Diagnosis not present

## 2018-11-23 DIAGNOSIS — M545 Low back pain: Secondary | ICD-10-CM | POA: Diagnosis not present

## 2018-11-23 DIAGNOSIS — Z79891 Long term (current) use of opiate analgesic: Secondary | ICD-10-CM | POA: Diagnosis not present

## 2018-11-23 DIAGNOSIS — M5431 Sciatica, right side: Secondary | ICD-10-CM | POA: Diagnosis not present

## 2018-11-23 DIAGNOSIS — G894 Chronic pain syndrome: Secondary | ICD-10-CM | POA: Diagnosis not present

## 2018-11-23 DIAGNOSIS — M5432 Sciatica, left side: Secondary | ICD-10-CM | POA: Diagnosis not present

## 2018-11-23 NOTE — Telephone Encounter (Signed)
Called pt He is doing fairly well Follows closely with Edrick Oh (nephrology)  I would recomm resched appt until October 2020 given curren pandemic Someone will call to reschedule

## 2018-11-24 NOTE — Telephone Encounter (Signed)
Cancelled upcoming appointment, will send to C19 r/s pool to plan follow up

## 2018-11-28 ENCOUNTER — Other Ambulatory Visit: Payer: Self-pay | Admitting: Family Medicine

## 2018-11-28 ENCOUNTER — Ambulatory Visit: Payer: Medicare Other | Admitting: Internal Medicine

## 2018-11-28 DIAGNOSIS — K5903 Drug induced constipation: Secondary | ICD-10-CM

## 2018-11-28 DIAGNOSIS — R109 Unspecified abdominal pain: Secondary | ICD-10-CM

## 2018-12-05 ENCOUNTER — Ambulatory Visit: Payer: Self-pay | Admitting: Family Medicine

## 2018-12-05 NOTE — Telephone Encounter (Signed)
Pt. Reports seeing blood "on and off in stools x 2 weeks." No pain or other symptoms. States he can not come in anytime before Wed. Spoke with Theadora Rama in the practice and he is scheduled for Friday. Instructed pt. If symptoms worsen to call back.  Reason for Disposition . MILD rectal bleeding (more than just a few drops or streaks)  Answer Assessment - Initial Assessment Questions 1. APPEARANCE of BLOOD: "What color is it?" "Is it passed separately, on the surface of the stool, or mixed in with the stool?"      Mixed in with stool and in the water 2. AMOUNT: "How much blood was passed?"      Covers the toilet paper 3. FREQUENCY: "How many times has blood been passed with the stools?"      Varies - not every time 4. ONSET: "When was the blood first seen in the stools?" (Days or weeks)      Started 2 weeks ago 5. DIARRHEA: "Is there also some diarrhea?" If so, ask: "How many diarrhea stools were passed in past 24 hours?"      No 6. CONSTIPATION: "Do you have constipation?" If so, "How bad is it?"     No 7. RECURRENT SYMPTOMS: "Have you had blood in your stools before?" If so, ask: "When was the last time?" and "What happened that time?"      Yes - had a colonoscopy 8. BLOOD THINNERS: "Do you take any blood thinners?" (e.g., Coumadin/warfarin, Pradaxa/dabigatran, aspirin)     No 9. OTHER SYMPTOMS: "Do you have any other symptoms?"  (e.g., abdominal pain, vomiting, dizziness, fever)     No pain 10. PREGNANCY: "Is there any chance you are pregnant?" "When was your last menstrual period?"       n/a  Protocols used: RECTAL BLEEDING-A-AH

## 2018-12-07 ENCOUNTER — Telehealth: Payer: Self-pay | Admitting: Family Medicine

## 2018-12-07 DIAGNOSIS — G8929 Other chronic pain: Secondary | ICD-10-CM | POA: Diagnosis not present

## 2018-12-07 DIAGNOSIS — Z79891 Long term (current) use of opiate analgesic: Secondary | ICD-10-CM | POA: Diagnosis not present

## 2018-12-07 DIAGNOSIS — G894 Chronic pain syndrome: Secondary | ICD-10-CM | POA: Diagnosis not present

## 2018-12-07 DIAGNOSIS — M5431 Sciatica, right side: Secondary | ICD-10-CM | POA: Diagnosis not present

## 2018-12-07 DIAGNOSIS — M5432 Sciatica, left side: Secondary | ICD-10-CM | POA: Diagnosis not present

## 2018-12-07 DIAGNOSIS — M545 Low back pain: Secondary | ICD-10-CM | POA: Diagnosis not present

## 2018-12-07 NOTE — Telephone Encounter (Signed)
Patient called and says he has an appointment on Friday at 8 am with Dr. Nolon Rod and wants to know if there are any openings today within the next 1-2 hours. I called the office and spoke to [place], [facility] and she says no availability today. I advised the patient and he says he will wait until Friday.

## 2018-12-09 ENCOUNTER — Other Ambulatory Visit: Payer: Self-pay

## 2018-12-09 ENCOUNTER — Telehealth (INDEPENDENT_AMBULATORY_CARE_PROVIDER_SITE_OTHER): Payer: Medicare Other | Admitting: Family Medicine

## 2018-12-09 VITALS — BP 140/82 | HR 73 | Temp 97.9°F | Wt 275.4 lb

## 2018-12-09 DIAGNOSIS — G894 Chronic pain syndrome: Secondary | ICD-10-CM | POA: Diagnosis not present

## 2018-12-09 DIAGNOSIS — K621 Rectal polyp: Secondary | ICD-10-CM

## 2018-12-09 DIAGNOSIS — I10 Essential (primary) hypertension: Secondary | ICD-10-CM | POA: Diagnosis not present

## 2018-12-09 DIAGNOSIS — K625 Hemorrhage of anus and rectum: Secondary | ICD-10-CM

## 2018-12-09 DIAGNOSIS — M5410 Radiculopathy, site unspecified: Secondary | ICD-10-CM | POA: Diagnosis not present

## 2018-12-09 DIAGNOSIS — N185 Chronic kidney disease, stage 5: Secondary | ICD-10-CM

## 2018-12-09 DIAGNOSIS — Z789 Other specified health status: Secondary | ICD-10-CM

## 2018-12-09 DIAGNOSIS — E1142 Type 2 diabetes mellitus with diabetic polyneuropathy: Secondary | ICD-10-CM

## 2018-12-09 LAB — POCT GLYCOSYLATED HEMOGLOBIN (HGB A1C): Hemoglobin A1C: 6.4 % — AB (ref 4.0–5.6)

## 2018-12-09 LAB — HEMOCCULT GUIAC POC 1CARD (OFFICE): Fecal Occult Blood, POC: NEGATIVE

## 2018-12-09 IMAGING — CT CT HEAD W/O CM
4 series · 16 of 47 positions shown, 18 images · non-contrast
Comparison: 06/06/2015.

CLINICAL DATA: Hallucinations. Bitten by a brown recluse spider in
the past with similar symptoms.

EXAM:
CT HEAD WITHOUT CONTRAST
TECHNIQUE: Contiguous axial images were obtained from the base of the skull
through the vertex without intravenous contrast.

[Series 3: head without · axial · non-contrast · 0.47mm/px · z∈[-13,+107]mm · 7 of 34 slices shown, 9 images]
[im 5/34  brain]
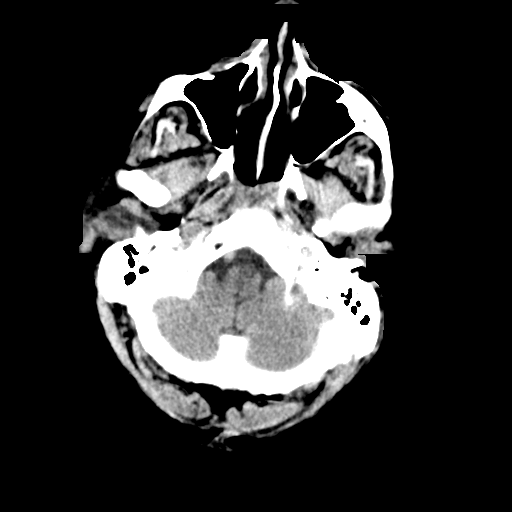
[im 5/34  bone]
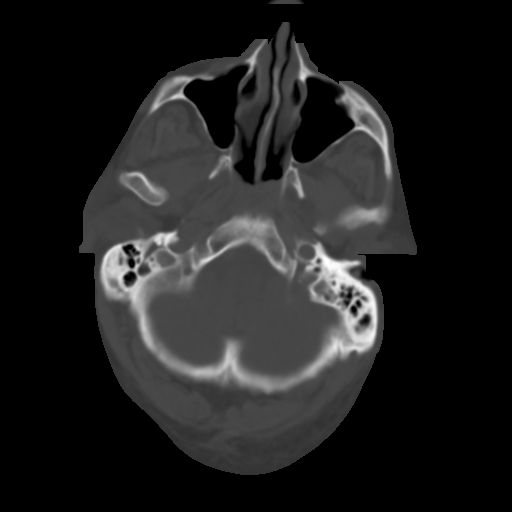
[im 9/34  brain]
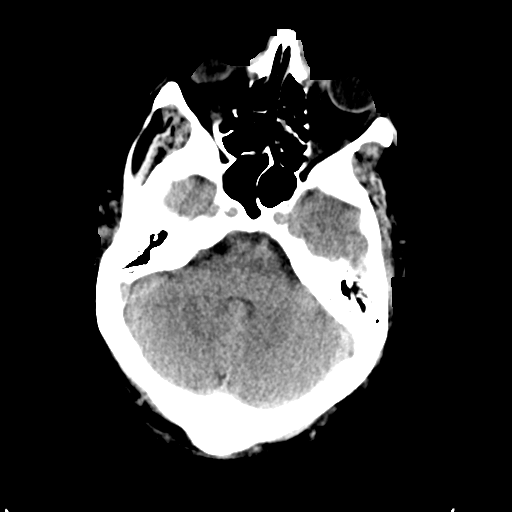
[im 13/34  brain]
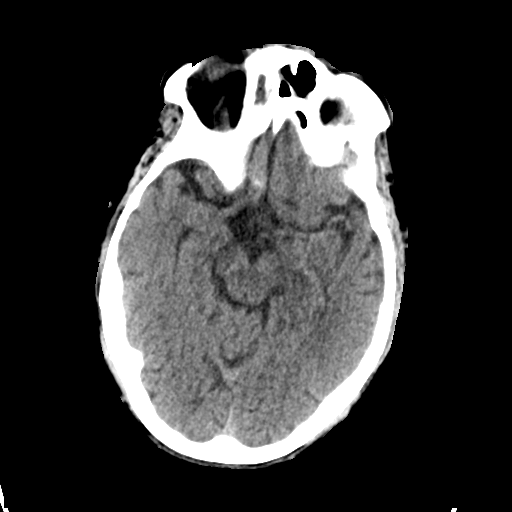
[im 17/34  brain]
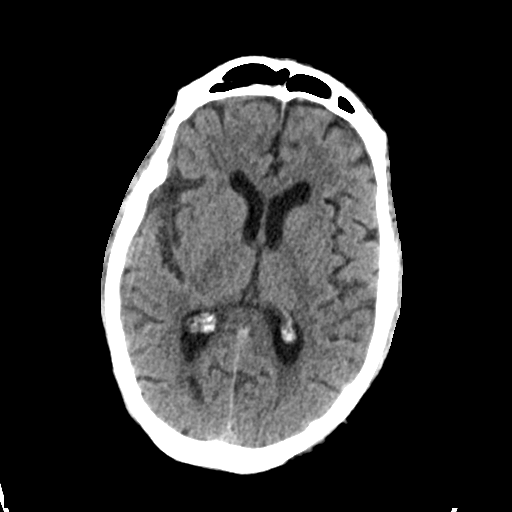
[im 21/34  brain]
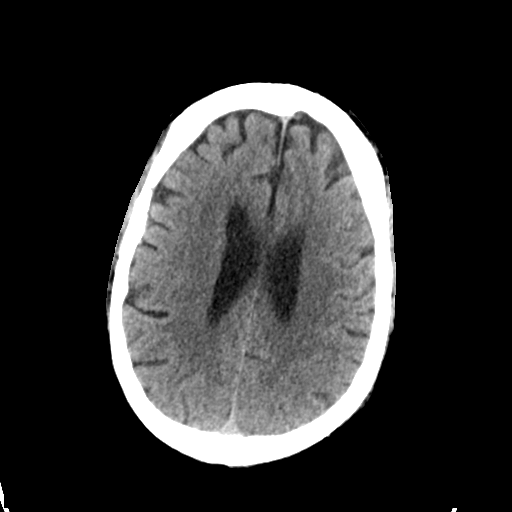
[im 21/34  bone]
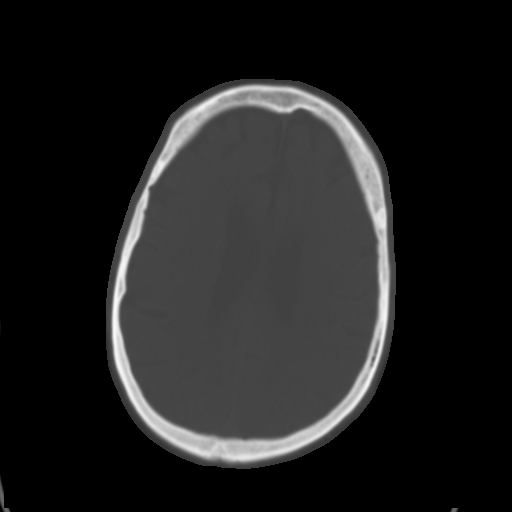
[im 25/34  brain]
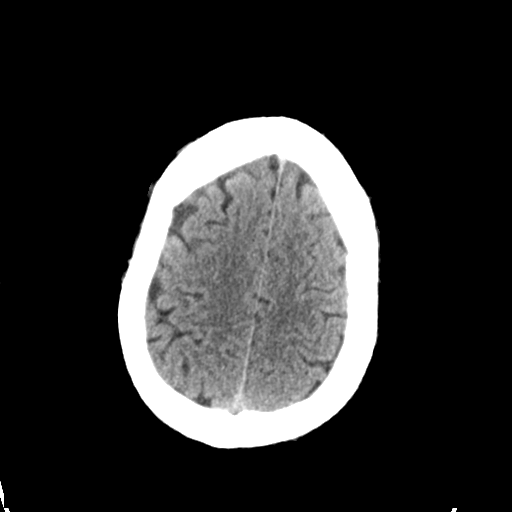
[im 29/34  brain]
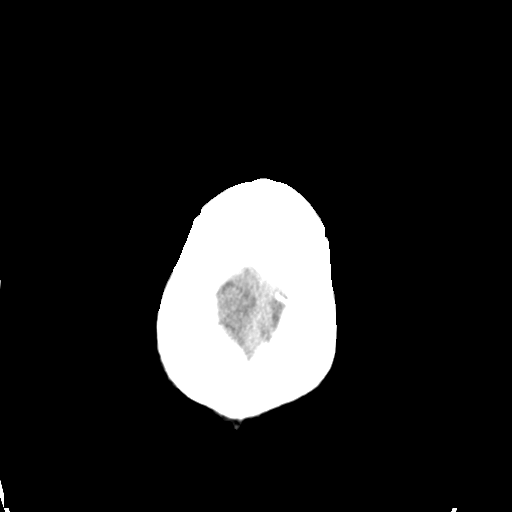

[Series 4: head bone · axial · 0.47mm/px · z∈[-17,+17]mm · 3 of 85 slices shown]
[im 9/85  bone]
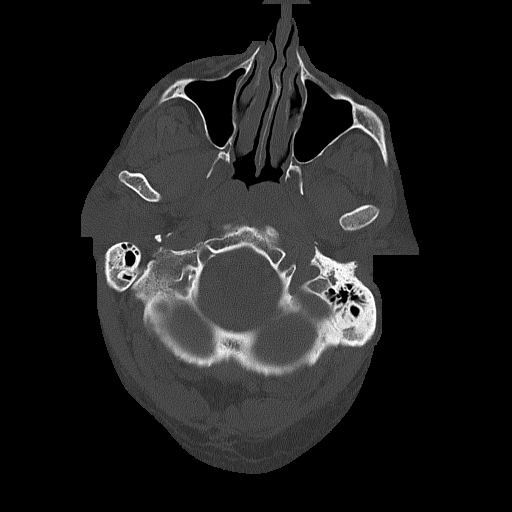
[im 17/85  bone]
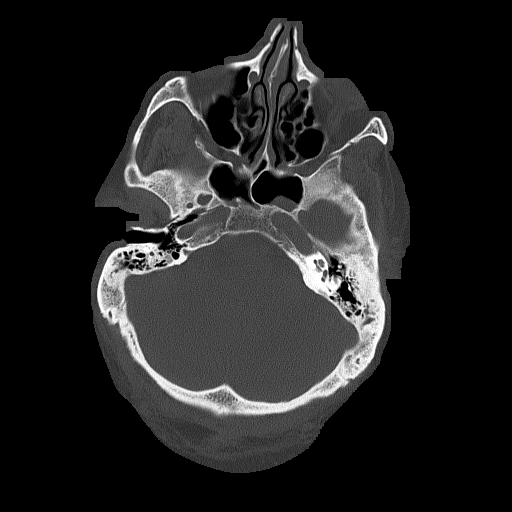
[im 26/85  bone]
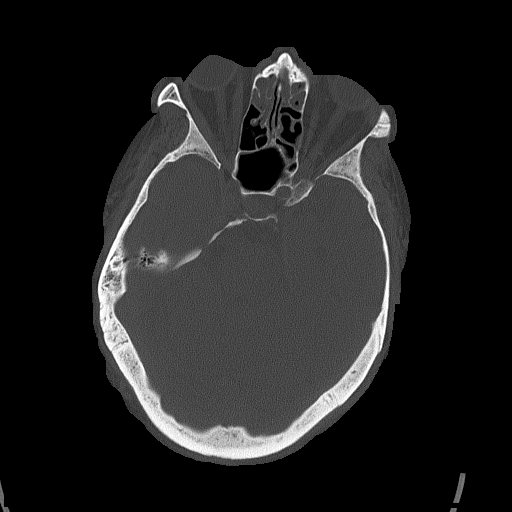

[Series 5: head without cor · coronal · non-contrast · 0.33mm/px · 3 of 73 slices shown]
[im 25/73  brain]
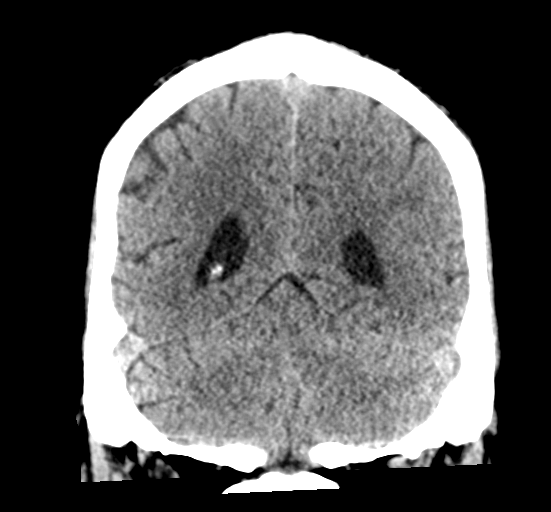
[im 33/73  brain]
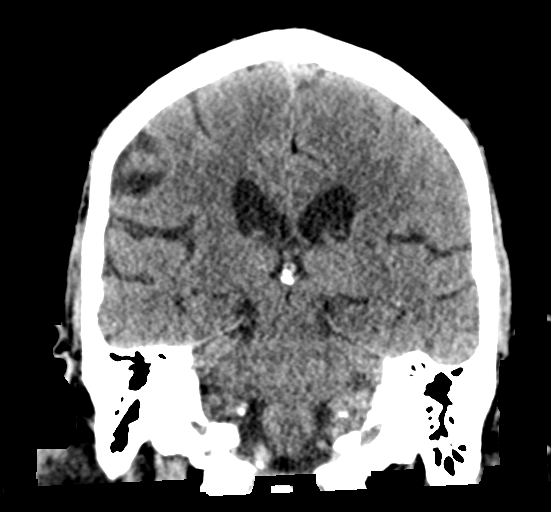
[im 41/73  brain]
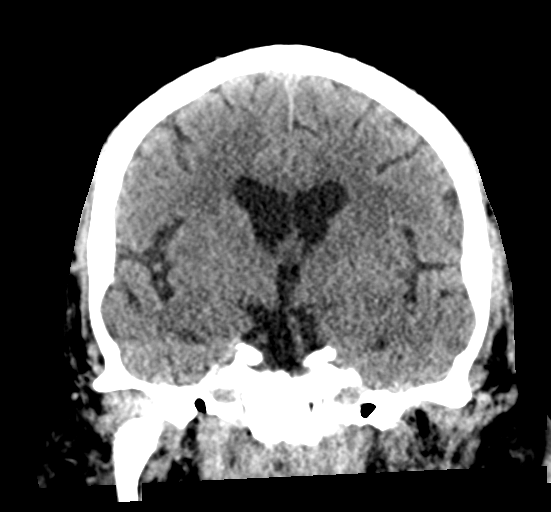

[Series 6: head without sag · sagittal · non-contrast · 0.33mm/px · 3 of 61 slices shown]
[im 21/61  brain]
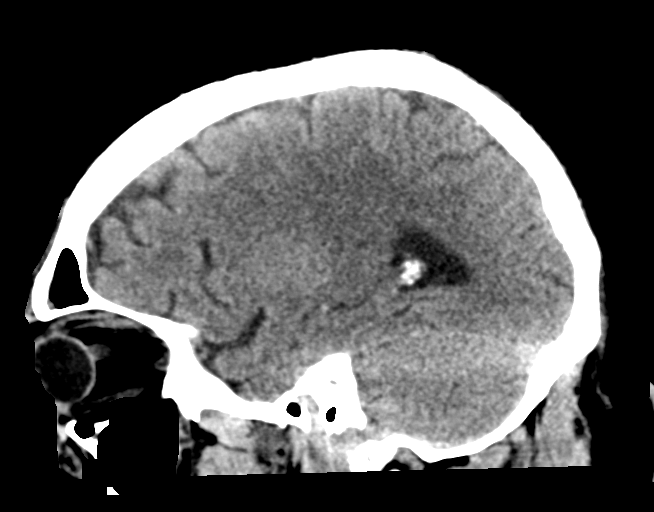
[im 31/61  brain]
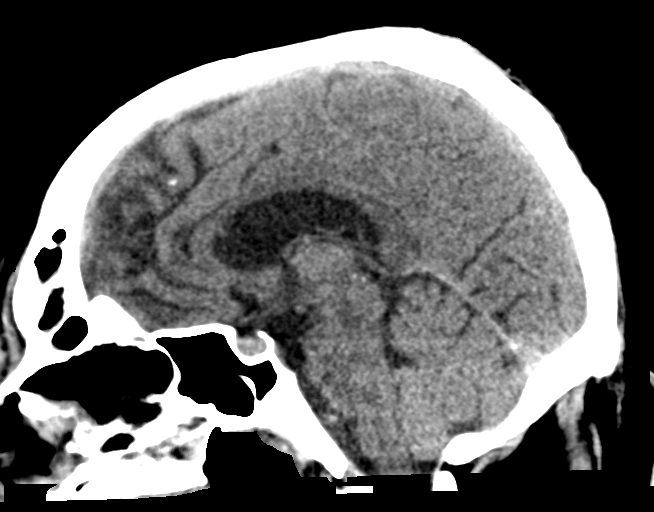
[im 41/61  brain]
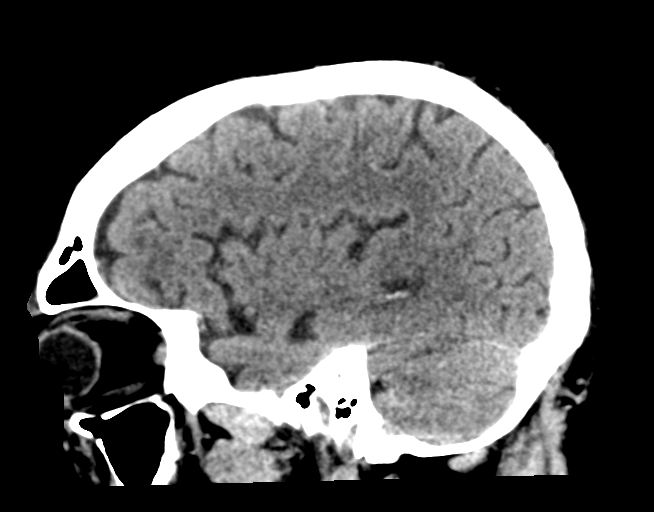

[16 of 47 positions shown; findings below may reference images not displayed]

FINDINGS: Brain: Mild diffuse cerebral and cerebellar atrophy with mild
progression. Minimal patchy white matter low density in both
cerebral hemispheres without significant change. No intracranial
hemorrhage, mass lesion or CT evidence of acute infarction.

Vascular: No hyperdense vessel or unexpected calcification.

Skull: Normal. Negative for fracture or focal lesion.

Sinuses/Orbits: Moderate bilateral ethmoid and frontal sinus mucosal
thickening. Minimal right sphenoid sinus mucosal thickening. Small
amount of retained secretions in the sphenoid sinus on the left.
Right maxillary sinus retention cysts. Status post cataract
extraction on the left. Unremarkable right orbit.

Other: None.
IMPRESSION: 1. No acute abnormality.
2. Mild diffuse cerebral and cerebellar atrophy with mild
progression.
3. Stable minimal chronic small vessel white matter ischemic changes
in both cerebral hemispheres.
4. Chronic sinusitis.

## 2018-12-09 MED ORDER — CETIRIZINE HCL 10 MG PO TABS: ORAL_TABLET | ORAL | 0 refills | Status: DC

## 2018-12-09 MED ORDER — OLOPATADINE HCL 0.2 % OP SOLN: 1.0000 [drp] | Freq: Every day | OPHTHALMIC | 3 refills | Status: DC

## 2018-12-09 NOTE — Progress Notes (Signed)
CC- patient came into the office today stating he is having blood in stool. Blood in stool has been going on for a 3 weeks but worse this week. This blood has been off/on. Blood is in the toilet , stool and on the tissue when wipe

## 2018-12-09 NOTE — Patient Instructions (Signed)
° ° ° °  If you have lab work done today you will be contacted with your lab results within the next 2 weeks.  If you have not heard from us then please contact us. The fastest way to get your results is to register for My Chart. ° ° °IF you received an x-ray today, you will receive an invoice from [facility]. Please contact [facility] at [phone] with questions or concerns regarding your invoice.  ° °IF you received labwork today, you will receive an invoice from [facility]. Please contact [facility] at [phone] with questions or concerns regarding your invoice.  ° °Our billing staff will not be able to assist you with questions regarding bills from these companies. ° °You will be contacted with the lab results as soon as they are available. The fastest way to get your results is to activate your My Chart account. Instructions are located on the last page of this paperwork. If you have not heard from us regarding the results in 2 weeks, please contact this office. °  ° ° ° °

## 2018-12-09 NOTE — Progress Notes (Signed)
CC: Blood per rectum  HPI  Rectal Bleeding He has been taking the gabapentin at night and takes 2-3 times  He states that he saw blood on the toilet paper and also in his stool He states that he cannot see very well and can only see up close but it looked like blood in his stool His last cscopy was done in 2017 and noted multiple diverticula He states that he has not been constipated since stopping the opiates He denies diarrhea No nausea or vomiting  No dizziness or lightheadedness   Stage V kidney disease Dr. Justin Mend at [facility] advised dialysis Patient continues to make urine He denies any dysria or blood in the urine Lab Results  Component Value Date   CREATININE 8.11 (H) 09/20/2018   Chronic Pain Syndrome Radiculopathy with LE sypmtoms  He reports that he was dismissed from [facility] due to a dirty drug screen He denies any inappropriate use He would like a new referral He is taking gabapentin for pain but reports that he knows that he should not be taking gabapentin  Diabetes Reports that his blood glucose was in the 90s this morning He is compliant with his insulin and is on Lantus 45 units He is still doing sliding scale on the humalog He states that he has fluctuations especially if he eats cereal He reports hypoglycemia but not since his insulin lantus dose was changed/ Lab Results  Component Value Date   HGBA1C 6.4 (A) 12/09/2018      Past Medical History:  Diagnosis Date  . Acute diastolic heart failure ([place])   . CHF (congestive heart failure) ([place])   . Diabetes mellitus with nephropathy ([place]) 05/12/2012   Dr. Justin Mend follows  . Hemorrhoids 05/12/2012  . Hyperlipidemia   . Hypertension   . Lumbar spinal stenosis 05/03/2012   Mild with only right L4 nerve root encroachment, no neurogenic claudication   . Lumbar spondylosis 05/03/2012  . Meralgia paraesthetica 05/03/2012  . Meralgia paraesthetica 05/03/2012   On Lyrica which does improve pain.   Marland Kitchen  Neuropathy in diabetes ([place]) 05/12/2012  . Obesity   . Pneumonia   . Retinopathy   . Sleep apnea    uses cpap  . Substance abuse ([place])   . Urinary incontinence 05/12/2012   Since the start of Sept. 2013     Current Outpatient Medications  Medication Sig Dispense Refill  . Accu-Chek Softclix Lancets lancets Use as instructed 450 each 0  . atorvastatin (LIPITOR) 80 MG tablet Take 1 tablet (80 mg total) by mouth daily at 6 PM. 90 tablet 0  . carvedilol (COREG) 25 MG tablet Take 1 tablet (25 mg total) by mouth 2 (two) times daily. 180 tablet 0  . cetirizine (ZYRTEC) 10 MG tablet TAKE ONE TABLET BY MOUTH ONCE DAILY FOR ALLERGIES 90 tablet 0  . clobetasol cream (TEMOVATE) 0.05 % APPLY TO AFFECED AREAS(S) TOPICALLY TWICE DAILY 60 g 0  . ferrous sulfate 325 (65 FE) MG tablet Take 1 tablet (325 mg total) by mouth daily. 90 tablet 1  . fluticasone (FLONASE) 50 MCG/ACT nasal spray USE TWO SPRAYS INTO EACH NOSTRILS DAILY 16 g 6  . furosemide (LASIX) 80 MG tablet Take 80-160 mg by mouth 2 (two) times daily. 2 Tablets in the AM and 1 Tablet in the PM     . GAVILAX powder TAKE ONE CAPFUL (17 GRAMS) BY MOUTH 2 (TWO) TIMES DAILY AS NEEDED. 510 g 1  . glucose blood (ACCU-CHEK  AVIVA PLUS) test strip 1 each by Other route 5 (five) times daily. Use as instructed 450 each 0  . glycerin adult 2 g suppository Place 1 suppository rectally as needed for constipation. 12 suppository 0  . hydrALAZINE (APRESOLINE) 100 MG tablet Take 1 tablet (100 mg total) by mouth 3 (three) times daily. 270 tablet 0  . Insulin Glargine (LANTUS SOLOSTAR) 100 UNIT/ML Solostar Pen Inject 45 Units into the skin daily. INJECT 30 UNITS UNDER THE SKIN EVERY MORNING AND INJECT 15 UNITS EVERY NIGHT 15 mL 6  . insulin lispro (HUMALOG KWIKPEN) 100 UNIT/ML KwikPen Inject 0.08 mLs (8 Units total) into the skin 3 (three) times daily. Max daily dose of 40 units daily 15 mL 6  . Insulin Pen Needle (B-D UF III MINI PEN NEEDLES) 31G X 5 MM MISC Four  times daily 360 each 0  . metolazone (ZAROXOLYN) 5 MG tablet Take 1 tablet (5 mg total) by mouth daily. 90 tablet 1  . multivitamin-iron-minerals-folic acid (CENTRUM) chewable tablet Chew 1 tablet by mouth daily.    Marland Kitchen oxycodone-acetaminophen (LYNOX) 10-300 MG tablet Take 1 tablet by mouth every 4 (four) hours as needed for pain.    Marland Kitchen Potassium Chloride ER 20 MEQ TBCR Take 20 mEq by mouth 2 (two) times daily. 180 tablet 2  . pregabalin (LYRICA) 75 MG capsule Take 75 mg by mouth 3 (three) times daily.    . verapamil (VERELAN PM) 180 MG 24 hr capsule Take 1 capsule (180 mg total) by mouth at bedtime. (Patient taking differently: Take 360 mg by mouth at bedtime. ) 90 capsule 3  . Olopatadine HCl 0.2 % SOLN Apply 1 drop to eye daily. 1 Bottle 3   Current Facility-Administered Medications  Medication Dose Route Frequency Provider Last Rate Last Dose  . triamcinolone acetonide (KENALOG) 10 MG/ML injection 10 mg  10 mg Other Once Landis Martins, DPM        Allergies: No Known Allergies  Past Surgical History:  Procedure Laterality Date  . ABDOMINAL SURGERY     Abscess I&D 2/2 infected hair  . AV FISTULA PLACEMENT Left 02/06/2016   Procedure: LEFT ARM RADIOCEPHALIC ARTERIOVENOUS (AV) FISTULA CREATION;  Surgeon: Rosetta Posner, MD;  Location: [place];  Service: Vascular;  Laterality: Left;  . COLONOSCOPY W/ POLYPECTOMY     pt to bring records  . COLONOSCOPY WITH PROPOFOL N/A 06/05/2016   Procedure: COLONOSCOPY WITH PROPOFOL;  Surgeon: Doran Stabler, MD;  Location: [facility];  Service: Gastroenterology;  Laterality: N/A;  . TONSILLECTOMY      Social History   Socioeconomic History  . Marital status: Single    Spouse name: Not on file  . Number of children: Not on file  . Years of education: Not on file  . Highest education level: Not on file  Occupational History  . Occupation: dietary services    Employer: [employer]  Social Needs  . Financial resource strain: Not on file  . Food  insecurity:    Worry: Not on file    Inability: Not on file  . Transportation needs:    Medical: Not on file    Non-medical: Not on file  Tobacco Use  . Smoking status: Current Every Day Smoker    Packs/day: 0.50    Years: 30.00    Pack years: 15.00    Types: Cigarettes  . Smokeless tobacco: Never Used  . Tobacco comment: "Trying - hard to stop"  Substance and Sexual Activity  .  Alcohol use: No    Comment: " about 40 ounce beer per month."  down to a beer every 3 weeks  . Drug use: No    Types: Marijuana, Cocaine    Comment: marijuana "laced with something"; today, stated "no" to question of illegal drug use 06/16/2016  . Sexual activity: Not on file  Lifestyle  . Physical activity:    Days per week: Not on file    Minutes per session: Not on file  . Stress: Not on file  Relationships  . Social connections:    Talks on phone: Not on file    Gets together: Not on file    Attends religious service: Not on file    Active member of club or organization: Not on file    Attends meetings of clubs or organizations: Not on file    Relationship status: Not on file  Other Topics Concern  . Not on file  Social History Narrative  . Not on file    Family History  Problem Relation Age of Onset  . Prostate cancer Father   . Alcohol abuse Father   . Emphysema Father        smoked  . Colon cancer Neg Hx      ROS Review of Systems See HPI Constitution: No fevers or chills No malaise No diaphoresis Skin: No rash or itching Eyes: no blurry vision, no double vision GU: he is still making good amount of urine  Neuro: no dizziness or headaches all others reviewed and negative   Objective: Vitals:   12/09/18 0931  BP: 140/82  Pulse: 73  Temp: 97.9 F (36.6 C)  TempSrc: Oral  SpO2: 96%  Weight: 275 lb 6.4 oz (124.9 kg)    Physical Exam Constitutional:      Appearance: Normal appearance.  HENT:     Head: Normocephalic and atraumatic.  Eyes:     Extraocular  Movements: Extraocular movements intact.     Conjunctiva/sclera: Conjunctivae normal.  Pulmonary:     Effort: Pulmonary effort is normal.  Abdominal:     Palpations: Abdomen is soft.  Skin:    General: Skin is warm.     Capillary Refill: Capillary refill takes less than 2 seconds.  Neurological:     Mental Status: He is alert. He is disoriented.  Psychiatric:        Mood and Affect: Mood normal.        Behavior: Behavior normal.        Thought Content: Thought content normal.        Judgment: Judgment normal.     Chaperone present Rectal exam: normal anal wink, normal sphincter tone, hemoccult card done Anoscopic exam Shows small rectal polyp No visible hemorrhoids No fissures   Assessment and Plan Diagnoses and all orders for this visit:  Essential hypertension, malignant- bp in good range, cpm -     POCT glycosylated hemoglobin (Hb A1C)  Stage 5 chronic kidney disease not on chronic dialysis (HCC) -     POCT glycosylated hemoglobin (Hb A1C) -     ToxASSURE Select 13 (MW), Urine  Chronic pain syndrome-  Will check a tox screen for illicits Last urine screen 11/22/2018 positive for cocaine Pt submitted a fresh urine sample. -     Ambulatory referral to Pain Clinic  Radiculopathy with lower extremity symptoms -     Ambulatory referral to Pain Clinic  Medically complex patient  BRBPR (bright red blood per rectum)- hemoccult negative Discussed that he  should follow up with GI  anosope showed a rectal polyp Pt with history of polyps on cscopy in 2017 -     Ambulatory referral to Gastroenterology  Rectal polyp - will refer to GI  Advised pt to keep bowel movements regular with metamucil  The polyp might not be the cause of the bleeding -     Ambulatory referral to Gastroenterology -     Hemoccult - 1 Card (office)  Diabetic peripheral neuropathy associated with type 2 diabetes mellitus ([place])-  Will check a1c -     POCT glycosylated hemoglobin (Hb A1C)  Other  orders -     cetirizine (ZYRTEC) 10 MG tablet; TAKE ONE TABLET BY MOUTH ONCE DAILY FOR ALLERGIES -     Olopatadine HCl 0.2 % SOLN; Apply 1 drop to eye daily.     [place]

## 2018-12-12 LAB — TOXASSURE SELECT 13 (MW), URINE

## 2018-12-12 NOTE — Progress Notes (Signed)
This patient contacted our office requesting a physician telemedicine video consultation regarding clinical questions and/or test results.   Participants on the Zoom visit : myself and patient   The patient consented to phone consultation and was aware that a charge will be placed through their insurance.  I was in my office and the patient was at home    Encounter time:  Total time 16 minutes, with 12 minutes spent with patient on phone/webex    [name], MD   _____________________________________________________________________________________________               [facility] Progress Note  Chief Complaint: Rectal bleeding  Subjective  History:  [facility] 2014 for Hx colon polyp in 2011 and chronic intermittent rectal bleeding.  Was unable to have colonoscopy performed. Colonoscopy with me Oct 2017 for Hx polyp had no polyps.  Left and right-sided diverticulosis, o/w nml exam.  Last few weeks painless rectal bleeding (he is pretty sure, despite being visually impaired) BM's normal, no abd or rectal pain.  Went to primary care last week, anoscopy performed, provider covered what they believed to be rectal polyp, no hemorrhoids seen.   ROS: Cardiovascular:  no chest pain Respiratory: no dyspnea  The patient's Past Medical, Family and Social History were reviewed and are on file in the EMR.  Objective:  Med list reviewed  Current Outpatient Medications:  .  Accu-Chek Softclix Lancets lancets, Use as instructed, Disp: 450 each, Rfl: 0 .  atorvastatin (LIPITOR) 80 MG tablet, Take 1 tablet (80 mg total) by mouth daily at 6 PM., Disp: 90 tablet, Rfl: 0 .  carvedilol (COREG) 25 MG tablet, Take 1 tablet (25 mg total) by mouth 2 (two) times daily., Disp: 180 tablet, Rfl: 0 .  cetirizine (ZYRTEC) 10 MG tablet, TAKE ONE TABLET BY MOUTH ONCE DAILY FOR ALLERGIES, Disp: 90 tablet, Rfl: 0 .  clobetasol cream (TEMOVATE) 0.05 %, APPLY TO AFFECED AREAS(S) TOPICALLY TWICE  DAILY, Disp: 60 g, Rfl: 0 .  ferrous sulfate 325 (65 FE) MG tablet, Take 1 tablet (325 mg total) by mouth daily., Disp: 90 tablet, Rfl: 1 .  fluticasone (FLONASE) 50 MCG/ACT nasal spray, USE TWO SPRAYS INTO EACH NOSTRILS DAILY, Disp: 16 g, Rfl: 6 .  furosemide (LASIX) 80 MG tablet, Take 80-160 mg by mouth 2 (two) times daily. 2 Tablets in the AM and 1 Tablet in the PM , Disp: , Rfl:  .  GAVILAX powder, TAKE ONE CAPFUL (17 GRAMS) BY MOUTH 2 (TWO) TIMES DAILY AS NEEDED., Disp: 510 g, Rfl: 1 .  glucose blood (ACCU-CHEK AVIVA PLUS) test strip, 1 each by Other route 5 (five) times daily. Use as instructed, Disp: 450 each, Rfl: 0 .  glycerin adult 2 g suppository, Place 1 suppository rectally as needed for constipation., Disp: 12 suppository, Rfl: 0 .  hydrALAZINE (APRESOLINE) 100 MG tablet, Take 1 tablet (100 mg total) by mouth 3 (three) times daily., Disp: 270 tablet, Rfl: 0 .  Insulin Glargine (LANTUS SOLOSTAR) 100 UNIT/ML Solostar Pen, Inject 45 Units into the skin daily. INJECT 30 UNITS UNDER THE SKIN EVERY MORNING AND INJECT 15 UNITS EVERY NIGHT, Disp: 15 mL, Rfl: 6 .  insulin lispro (HUMALOG KWIKPEN) 100 UNIT/ML KwikPen, Inject 0.08 mLs (8 Units total) into the skin 3 (three) times daily. Max daily dose of 40 units daily, Disp: 15 mL, Rfl: 6 .  Insulin Pen Needle (B-D UF III MINI PEN NEEDLES) 31G X 5 MM MISC, Four times daily, Disp: 360 each,  Rfl: 0 .  metolazone (ZAROXOLYN) 5 MG tablet, Take 1 tablet (5 mg total) by mouth daily., Disp: 90 tablet, Rfl: 1 .  multivitamin-iron-minerals-folic acid (CENTRUM) chewable tablet, Chew 1 tablet by mouth daily., Disp: , Rfl:  .  Olopatadine HCl 0.2 % SOLN, Apply 1 drop to eye daily., Disp: 1 Bottle, Rfl: 3 .  oxycodone-acetaminophen (LYNOX) 10-300 MG tablet, Take 1 tablet by mouth every 4 (four) hours as needed for pain., Disp: , Rfl:  .  Potassium Chloride ER 20 MEQ TBCR, Take 20 mEq by mouth 2 (two) times daily., Disp: 180 tablet, Rfl: 2 .  pregabalin  (LYRICA) 75 MG capsule, Take 75 mg by mouth 3 (three) times daily., Disp: , Rfl:  .  verapamil (VERELAN PM) 180 MG 24 hr capsule, Take 1 capsule (180 mg total) by mouth at bedtime. (Patient taking differently: Take 360 mg by mouth at bedtime. ), Disp: 90 capsule, Rfl: 3  Current Facility-Administered Medications:  .  triamcinolone acetonide (KENALOG) 10 MG/ML injection 10 mg, 10 mg, Other, Once, [place]over, [place], DPM    No recent labs    @ASSESSMENTPLANBEGIN @ Assessment: Encounter Diagnosis  Name Primary?  . Rectal bleeding Yes   Also multiple chronic medical conditions.   Plan: Bring him in for clinic visit next week for rectal exam and anoscopy.  Sounds most like hemorrhoidal bleeding.   [name]

## 2018-12-13 ENCOUNTER — Ambulatory Visit (INDEPENDENT_AMBULATORY_CARE_PROVIDER_SITE_OTHER): Payer: Medicare Other | Admitting: Gastroenterology

## 2018-12-13 ENCOUNTER — Other Ambulatory Visit: Payer: Self-pay

## 2018-12-13 DIAGNOSIS — K625 Hemorrhage of anus and rectum: Secondary | ICD-10-CM

## 2018-12-14 DIAGNOSIS — E113512 Type 2 diabetes mellitus with proliferative diabetic retinopathy with macular edema, left eye: Secondary | ICD-10-CM | POA: Diagnosis not present

## 2018-12-15 ENCOUNTER — Encounter: Payer: Self-pay | Admitting: Family Medicine

## 2018-12-16 ENCOUNTER — Telehealth: Payer: Self-pay

## 2018-12-16 NOTE — Telephone Encounter (Signed)
Lab results sent via mail to pt home address. Dgaddy, CMA

## 2018-12-21 ENCOUNTER — Ambulatory Visit (INDEPENDENT_AMBULATORY_CARE_PROVIDER_SITE_OTHER): Payer: Medicare Other | Admitting: Gastroenterology

## 2018-12-21 ENCOUNTER — Encounter: Payer: Self-pay | Admitting: Gastroenterology

## 2018-12-21 ENCOUNTER — Other Ambulatory Visit: Payer: Self-pay

## 2018-12-21 VITALS — BP 134/88 | HR 66 | Temp 98.0°F | Ht 72.0 in | Wt 280.0 lb

## 2018-12-21 DIAGNOSIS — K648 Other hemorrhoids: Secondary | ICD-10-CM | POA: Diagnosis not present

## 2018-12-21 DIAGNOSIS — K5909 Other constipation: Secondary | ICD-10-CM | POA: Diagnosis not present

## 2018-12-21 MED ORDER — PHENYLEPHRINE IN HARD FAT 0.25 % RE SUPP: 1.0000 | Freq: Two times a day (BID) | RECTAL | 2 refills | Status: DC | PRN

## 2018-12-21 NOTE — Progress Notes (Signed)
[facility] Progress Note  Chief Complaint: Rectal bleeding  Subjective  History:  From my telephone visit last week: "Saw Jerry Cantrell 2014 for Hx colon polyp in 2011 and chronic intermittent rectal bleeding.  Was unable to have colonoscopy performed. Colonoscopy with me Oct 2017 for Hx polyp had no polyps.  Left and right-sided diverticulosis, o/w nml exam.   Last few weeks painless rectal bleeding (he is pretty sure, despite being visually impaired) BM's normal, no abd or rectal pain.   Went to primary care last week, anoscopy performed, provider covered what they believed to be rectal polyp, no hemorrhoids seen."  ____________________________________  Jerry Cantrell struggles with constipation at times.  He has been previously prescribed GlycoLax powder, it sounds that he takes it on occasion.  He feels that several of his other medicines might be adding to constipation, including iron and opioids.  There is straining for stool at times.  He has blood on the paper after BMs, sometimes in the toilet bowl.  He denies chronic abdominal pain, nausea or vomiting.   ROS: Cardiovascular:  no chest pain Respiratory: no dyspnea Chronic musculoskeletal pain  The patient's Past Medical, Family and Social History were reviewed and are on file in the EMR.  Past Medical History:  Diagnosis Date  . Acute diastolic heart failure ([place])   . CHF (congestive heart failure) ([place])   . Diabetes mellitus with nephropathy ([place]) 05/12/2012   Dr. Justin Mend follows  . Hemorrhoids 05/12/2012  . Hyperlipidemia   . Hypertension   . Lumbar spinal stenosis 05/03/2012   Mild with only right L4 nerve root encroachment, no neurogenic claudication   . Lumbar spondylosis 05/03/2012  . Meralgia paraesthetica 05/03/2012  . Meralgia paraesthetica 05/03/2012   On Lyrica which does improve pain.   Marland Kitchen Neuropathy in diabetes ([place]) 05/12/2012  . Obesity   . Pneumonia   . Retinopathy   . Sleep apnea    uses cpap  . Substance  abuse ([place])   . Urinary incontinence 05/12/2012   Since the start of Sept. 2013     Objective:  Med list reviewed  Current Outpatient Medications:  .  Accu-Chek Softclix Lancets lancets, Use as instructed, Disp: 450 each, Rfl: 0 .  atorvastatin (LIPITOR) 80 MG tablet, Take 1 tablet (80 mg total) by mouth daily at 6 PM., Disp: 90 tablet, Rfl: 0 .  carvedilol (COREG) 25 MG tablet, Take 1 tablet (25 mg total) by mouth 2 (two) times daily., Disp: 180 tablet, Rfl: 0 .  cetirizine (ZYRTEC) 10 MG tablet, TAKE ONE TABLET BY MOUTH ONCE DAILY FOR ALLERGIES, Disp: 90 tablet, Rfl: 0 .  clobetasol cream (TEMOVATE) 0.05 %, APPLY TO AFFECED AREAS(S) TOPICALLY TWICE DAILY, Disp: 60 g, Rfl: 0 .  ferrous sulfate 325 (65 FE) MG tablet, Take 1 tablet (325 mg total) by mouth daily., Disp: 90 tablet, Rfl: 1 .  fluticasone (FLONASE) 50 MCG/ACT nasal spray, USE TWO SPRAYS INTO EACH NOSTRILS DAILY, Disp: 16 g, Rfl: 6 .  furosemide (LASIX) 80 MG tablet, Take 80-160 mg by mouth 2 (two) times daily. 2 Tablets in the AM and 1 Tablet in the PM , Disp: , Rfl:  .  GAVILAX powder, TAKE ONE CAPFUL (17 GRAMS) BY MOUTH 2 (TWO) TIMES DAILY AS NEEDED., Disp: 510 g, Rfl: 1 .  glucose blood (ACCU-CHEK AVIVA PLUS) test strip, 1 each by Other route 5 (five) times daily. Use as instructed, Disp: 450 each, Rfl: 0 .  glycerin adult 2 g suppository,  Place 1 suppository rectally as needed for constipation., Disp: 12 suppository, Rfl: 0 .  hydrALAZINE (APRESOLINE) 100 MG tablet, Take 1 tablet (100 mg total) by mouth 3 (three) times daily., Disp: 270 tablet, Rfl: 0 .  Insulin Glargine (LANTUS SOLOSTAR) 100 UNIT/ML Solostar Pen, Inject 45 Units into the skin daily. INJECT 30 UNITS UNDER THE SKIN EVERY MORNING AND INJECT 15 UNITS EVERY NIGHT, Disp: 15 mL, Rfl: 6 .  insulin lispro (HUMALOG KWIKPEN) 100 UNIT/ML KwikPen, Inject 0.08 mLs (8 Units total) into the skin 3 (three) times daily. Max daily dose of 40 units daily, Disp: 15 mL, Rfl: 6 .   Insulin Pen Needle (B-D UF III MINI PEN NEEDLES) 31G X 5 MM MISC, Four times daily, Disp: 360 each, Rfl: 0 .  metolazone (ZAROXOLYN) 5 MG tablet, Take 1 tablet (5 mg total) by mouth daily., Disp: 90 tablet, Rfl: 1 .  multivitamin-iron-minerals-folic acid (CENTRUM) chewable tablet, Chew 1 tablet by mouth daily., Disp: , Rfl:  .  Olopatadine HCl 0.2 % SOLN, Apply 1 drop to eye daily., Disp: 1 Bottle, Rfl: 3 .  oxycodone-acetaminophen (LYNOX) 10-300 MG tablet, Take 1 tablet by mouth every 4 (four) hours as needed for pain., Disp: , Rfl:  .  Potassium Chloride ER 20 MEQ TBCR, Take 20 mEq by mouth 2 (two) times daily., Disp: 180 tablet, Rfl: 2 .  pregabalin (LYRICA) 75 MG capsule, Take 75 mg by mouth 3 (three) times daily., Disp: , Rfl:  .  verapamil (VERELAN PM) 180 MG 24 hr capsule, Take 1 capsule (180 mg total) by mouth at bedtime. (Patient taking differently: Take 360 mg by mouth at bedtime. ), Disp: 90 capsule, Rfl: 3   Vital signs in last 24 hrs: Vitals:   12/21/18 0934  BP: 134/88  Pulse: 66  Temp: 98 F (36.7 C)    Physical Exam  Chronically ill-appearing man, morbidly obese  HEENT: sclera anicteric, oral mucosa moist without lesions  Neck: supple, no thyromegaly, JVD or lymphadenopathy  Cardiac: RRR without murmurs, S1S2 heard, no peripheral edema  Pulm: clear to auscultation bilaterally, normal RR and effort noted  Abdomen: soft, no tenderness, with active bowel sounds. No guarding or palpable hepatosplenomegaly, omitted by body habitus  Skin; warm and dry, no jaundice or rash.  Multiple tattoos Rectal exam: External exam reveals a perianal skin tag DRE without tenderness or palpable internal lesion or fissure. Anoscopy: Grade 1 internal hemorrhoids  Recent Labs:  CBC Latest Ref Rng & Units 09/18/2018 09/17/2018 06/29/2018  WBC 4.0 - 10.5 K/uL 6.7 8.8 7.2  Hemoglobin 13.0 - 17.0 g/dL 11.7(L) 12.3(L) 12.7(L)  Hematocrit 39.0 - 52.0 % 37.8(L) 40.2 41.3  Platelets 150 -  400 K/uL 149(L) 174 152   CMP Latest Ref Rng & Units 09/20/2018 09/19/2018 09/18/2018  Glucose 70 - 99 mg/dL 298(H) 228(H) 273(H)  BUN 6 - 20 mg/dL 98(H) 94(H) 95(H)  Creatinine 0.61 - 1.24 mg/dL 8.11(H) 7.99(H) 8.17(H)  Sodium 135 - 145 mmol/L 137 141 142  Potassium 3.5 - 5.1 mmol/L 2.9(L) 3.1(L) 3.3(L)  Chloride 98 - 111 mmol/L 94(L) 94(L) 99  CO2 22 - 32 mmol/L 26 29 27   Calcium 8.9 - 10.3 mg/dL 9.4 9.4 8.8(L)  Total Protein 6.5 - 8.1 g/dL - - -  Total Bilirubin 0.3 - 1.2 mg/dL - - -  Alkaline Phos 38 - 126 U/L - - -  AST 15 - 41 U/L - - -  ALT 0 - 44 U/L - - -  Hgb A1C  = 6.4 on 12/09/18 FOBT neg with PCP 12/09/18  Radiologic studies:    @ASSESSMENTPLANBEGIN @ Assessment: Encounter Diagnoses  Name Primary?  . Bleeding internal hemorrhoids Yes  . Chronic constipation     The bleeding is from internal hemorrhoids, exacerbated by persistent constipation due to chronic medical illness and polypharmacy. I showed him a diagram of the anatomy, discussed internal hemorrhoids.  Most of the time the bleeding can be controlled with regulation of bowel habits.  Plan: High-fiber diet He really needs to take the GlycoLax consistently every day.  He can increase to a capful twice a day if he is still straining for bowel movements. I prescribed Preparation H suppositories to use as needed with bleeding.  If bleeding persists despite adherence to the above regimen, banding could be considered.  Total time 25 minutes, over half spent face-to-face with patient in counseling and coordination of care.   [name]

## 2018-12-21 NOTE — Patient Instructions (Signed)
If you are age 53 or older, your body mass index should be between 23-30. Your Body mass index is 37.97 kg/m. If this is out of the aforementioned range listed, please consider follow up with your Primary Care Provider.  If you are age 90 or younger, your body mass index should be between 19-25. Your Body mass index is 37.97 kg/m. If this is out of the aformentioned range listed, please consider follow up with your Primary Care Provider.   To help prevent the possible spread of infection to our patients, communities, and staff; we will be implementing the following measures:  As of now we are not allowing any visitors/family members to accompany you to any upcoming appointments with [facility]. If you have any concerns about this please contact our office to discuss prior to the appointment.   We have sent the following medications to your pharmacy for you to pick up at your convenience: rectal suppository.   Please start Miralax ( or generic equivalent )  Take one capful daily.   It was a pleasure to see you today!  Dr. Loletha Carrow

## 2018-12-27 DIAGNOSIS — I120 Hypertensive chronic kidney disease with stage 5 chronic kidney disease or end stage renal disease: Secondary | ICD-10-CM | POA: Diagnosis not present

## 2018-12-27 DIAGNOSIS — D631 Anemia in chronic kidney disease: Secondary | ICD-10-CM | POA: Diagnosis not present

## 2018-12-27 DIAGNOSIS — N185 Chronic kidney disease, stage 5: Secondary | ICD-10-CM | POA: Diagnosis not present

## 2018-12-27 DIAGNOSIS — N2581 Secondary hyperparathyroidism of renal origin: Secondary | ICD-10-CM | POA: Diagnosis not present

## 2018-12-27 DIAGNOSIS — E1122 Type 2 diabetes mellitus with diabetic chronic kidney disease: Secondary | ICD-10-CM | POA: Diagnosis not present

## 2019-01-03 ENCOUNTER — Ambulatory Visit: Payer: Self-pay

## 2019-01-03 NOTE — Telephone Encounter (Signed)
Patient called and says he's been having SOB x 2 days. He says he noticed it more out working in the yard yesterday and noticed it even as he's on his breathing machine at night. He says he doesn't have a runny nose, no cough, no chest congestion. He says the SOB comes and goes, not all the time. He says he's lying in the bed now and is SOB. He is speaking clearly in full sentences while on the phone. He denies travel or exposure to anyone who has traveled or with COVID-19, he says he wears a mask and gloves when out. He denies any other symptoms. I placed the patient on hold and called the office, spoke with Theadora Rama, [facility] who says to send the call to her. When I was getting ready to connect the call, the patient was not on the line. Theadora Rama says if he calls back to let him know he can be seen this afternoon. I called the patient, left VM to call the office back to have the appointment scheduled.  Answer Assessment - Initial Assessment Questions 1. RESPIRATORY STATUS: "Describe your breathing?" (e.g., wheezing, shortness of breath, unable to speak, severe coughing)      Shortness of breath 2. ONSET: "When did this breathing problem begin?"      A couple of days ago 3. PATTERN "Does the difficult breathing come and go, or has it been constant since it started?"      Comes and goes 4. SEVERITY: "How bad is your breathing?" (e.g., mild, moderate, severe)    - MILD: No SOB at rest, mild SOB with walking, speaks normally in sentences, can lay down, no retractions, pulse < 100.    - MODERATE: SOB at rest, SOB with minimal exertion and prefers to sit, cannot lie down flat, speaks in phrases, mild retractions, audible wheezing, pulse 100-120.    - SEVERE: Very SOB at rest, speaks in single words, struggling to breathe, sitting hunched forward, retractions, pulse > 120      Moderate 5. RECURRENT SYMPTOM: "Have you had difficulty breathing before?" If so, ask: "When was the last time?" and "What happened that time?"       Yes 6. CARDIAC HISTORY: "Do you have any history of heart disease?" (e.g., heart attack, angina, bypass surgery, angioplasty)      No 7. LUNG HISTORY: "Do you have any history of lung disease?"  (e.g., pulmonary embolus, asthma, emphysema)     No, sleep apnea 8. CAUSE: "What do you think is causing the breathing problem?"      I don't know 9. OTHER SYMPTOMS: "Do you have any other symptoms? (e.g., dizziness, runny nose, cough, chest pain, fever)     No 10. PREGNANCY: "Is there any chance you are pregnant?" "When was your last menstrual period?"      N/A 11. TRAVEL: "Have you traveled out of the country in the last month?" (e.g., travel history, exposures)      No  Protocols used: BREATHING DIFFICULTY-A-AH

## 2019-01-04 ENCOUNTER — Telehealth: Payer: Self-pay

## 2019-01-04 ENCOUNTER — Other Ambulatory Visit: Payer: Self-pay

## 2019-01-04 ENCOUNTER — Telehealth: Payer: Self-pay | Admitting: Family Medicine

## 2019-01-04 ENCOUNTER — Ambulatory Visit (INDEPENDENT_AMBULATORY_CARE_PROVIDER_SITE_OTHER): Payer: Medicare Other | Admitting: Family Medicine

## 2019-01-04 VITALS — BP 172/88 | HR 71 | Temp 98.4°F | Resp 20 | Ht 71.1 in | Wt 278.8 lb

## 2019-01-04 DIAGNOSIS — M5431 Sciatica, right side: Secondary | ICD-10-CM | POA: Diagnosis not present

## 2019-01-04 DIAGNOSIS — G894 Chronic pain syndrome: Secondary | ICD-10-CM | POA: Diagnosis not present

## 2019-01-04 DIAGNOSIS — G8929 Other chronic pain: Secondary | ICD-10-CM | POA: Diagnosis not present

## 2019-01-04 DIAGNOSIS — M545 Low back pain: Secondary | ICD-10-CM | POA: Diagnosis not present

## 2019-01-04 DIAGNOSIS — Z79891 Long term (current) use of opiate analgesic: Secondary | ICD-10-CM | POA: Diagnosis not present

## 2019-01-04 DIAGNOSIS — Z5321 Procedure and treatment not carried out due to patient leaving prior to being seen by health care provider: Secondary | ICD-10-CM

## 2019-01-04 DIAGNOSIS — M5432 Sciatica, left side: Secondary | ICD-10-CM | POA: Diagnosis not present

## 2019-01-04 DIAGNOSIS — G890 Central pain syndrome: Secondary | ICD-10-CM | POA: Diagnosis not present

## 2019-01-04 NOTE — Telephone Encounter (Signed)
Pt came into office c/o of sob, fatigue and eye problem. As I was asking him triage questions pt got upset and states that" why are you  being all in his business". I stated to the pt that I was only asking him about his symptoms and what brings him in today. Pt used several curse words towards me and walked out of the room and left the building. I reported it to my office manger.

## 2019-01-04 NOTE — Telephone Encounter (Signed)
Copied from [place] [id number]. Topic: Complaint - Staff >> Jan 04, 2019  9:51 AM Antonieta Iba C wrote: Pt called in upset. Pt says that he came in this morning and feels that he wasn't treated professionally. Pt says that he is vision impaired and feels that he was treated like a liar. Pt would like a call back   Route to Engineer, building services.  CB: [phone]

## 2019-01-04 NOTE — Progress Notes (Signed)
Did not see pt-left while being triaged

## 2019-01-06 ENCOUNTER — Ambulatory Visit: Payer: Self-pay | Admitting: Adult Health

## 2019-01-09 DIAGNOSIS — B353 Tinea pedis: Secondary | ICD-10-CM | POA: Diagnosis not present

## 2019-01-09 DIAGNOSIS — B351 Tinea unguium: Secondary | ICD-10-CM | POA: Diagnosis not present

## 2019-01-09 DIAGNOSIS — L309 Dermatitis, unspecified: Secondary | ICD-10-CM | POA: Diagnosis not present

## 2019-01-10 DIAGNOSIS — G4733 Obstructive sleep apnea (adult) (pediatric): Secondary | ICD-10-CM | POA: Diagnosis not present

## 2019-01-18 ENCOUNTER — Encounter: Payer: Self-pay | Admitting: Internal Medicine

## 2019-01-18 ENCOUNTER — Encounter: Payer: Medicare Other | Admitting: Internal Medicine

## 2019-01-18 ENCOUNTER — Other Ambulatory Visit: Payer: Self-pay

## 2019-01-18 NOTE — Progress Notes (Signed)
Name: Jerry Cantrell  Age/ Sex: 53 y.o., male   MRN/ DOB: [id number], [birth year]     PCP: Forrest Moron, MD   Reason for Endocrinology Evaluation: Type 2 Diabetes Mellitus  Initial Endocrine Consultative Visit: 08/22/2018    PATIENT IDENTIFIER: Jerry Cantrell is a 53 y.o. male with a past medical history of HTN, T2DM, dyslipidemia and CKD V, OSA on CPAP . The patient has followed with Endocrinology clinic since 08/22/2018 for consultative assistance with management of his diabetes.  DIABETIC HISTORY:  Mr. Addo was diagnosed with T2DM ~ in 2004, he does not recall oral glycemic agents that he has tried in the past.He has been on insulin therapy for years. His hemoglobin A1c has ranged from 7.0 % in 01/2018 peaking at 9.1%in 2018    On his initial visit to our clinic his A1c was 5.9% due to recurrent hypoglycemia.  SUBJECTIVE:   During the last visit (10/20/2018): His A1c was 5.9% due to recurrent hypoglycemia, will adjust his Lantus to 30 units once a day and started a standing dose of Humalog between 5 to 10 units with each meal, rather than a sliding scale.  Today (01/18/2019): [name] is a here for a virtual 23-month follow-up on his diabetes management.  He checks his blood sugars 4 times daily. The patient has had hypoglycemic episodes since the last clinic visit, which typically occur  1 xweek - most often occurring during the day. The patient is symptomatic with these episodes , with symptoms of shaky. Otherwise, the patient has not required any recent emergency interventions for hypoglycemia and has had recent hospitalizations on September 17, 2018 upper extremity cellulitis.    ROS: As per HPI and as detailed below: Review of Systems  Constitutional: Positive for malaise/fatigue.  Respiratory: Negative for cough and shortness of breath.   Cardiovascular: Negative for chest pain and palpitations.   Gastrointestinal: Positive for nausea.      HOME DIABETES REGIMEN:  Lantus 45 units daily  Humalog 8 units TID QAC Humalog 2 units with snacks.    Statin: Yes ACE-I/ARB: Yes    GLUCOSE LOG:     DIABETIC COMPLICATIONS: Microvascular complications:   CKD, neuropathy , B/L retinopathy, S/P injections   Last eye exam: Completed 07/2018   Macrovascular complications:    Denies: CAD, PVD, CVA   HISTORY:  Past Medical History:  Past Medical History:  Diagnosis Date  . Acute diastolic heart failure ([place])   . CHF (congestive heart failure) ([place])   . Diabetes mellitus with nephropathy ([place]) 05/12/2012   Dr. Justin Mend follows  . Hemorrhoids 05/12/2012  . Hyperlipidemia   . Hypertension   . Lumbar spinal stenosis 05/03/2012   Mild with only right L4 nerve root encroachment, no neurogenic claudication   . Lumbar spondylosis 05/03/2012  . Meralgia paraesthetica 05/03/2012  . Meralgia paraesthetica 05/03/2012   On Lyrica which does improve pain.   Marland Kitchen Neuropathy in diabetes ([place]) 05/12/2012  . Obesity   . Pneumonia   . Retinopathy   . Sleep apnea    uses cpap  . Substance abuse ([place])   . Urinary incontinence 05/12/2012   Since the start of Sept. 2013    Past Surgical History:  Past Surgical History:  Procedure Laterality Date  . ABDOMINAL SURGERY     Abscess I&D 2/2 infected hair  . AV FISTULA PLACEMENT Left 02/06/2016   Procedure: LEFT ARM RADIOCEPHALIC ARTERIOVENOUS (AV) FISTULA CREATION;  Surgeon: Rosetta Posner, MD;  Location: [facility];  Service: Vascular;  Laterality: Left;  . COLONOSCOPY W/ POLYPECTOMY     pt to bring records  . COLONOSCOPY WITH PROPOFOL N/A 06/05/2016   Procedure: COLONOSCOPY WITH PROPOFOL;  Surgeon: Doran Stabler, MD;  Location: [facility];  Service: Gastroenterology;  Laterality: N/A;  . TONSILLECTOMY      Social History:  reports that he has been smoking cigarettes. He has a 15.00 pack-year smoking history. He has never used smokeless tobacco. He  reports that he does not drink alcohol or use drugs. Family History:  Family History  Problem Relation Age of Onset  . Prostate cancer Father   . Alcohol abuse Father   . Emphysema Father        smoked  . Diabetes Sister   . Colon cancer Neg Hx   . Colon polyps Neg Hx   . Stomach cancer Neg Hx   . Rectal cancer Neg Hx      HOME MEDICATIONS: Allergies as of 01/18/2019   No Known Allergies     Medication List       Accurate as of Jan 18, 2019  8:18 AM. If you have any questions, ask your nurse or doctor.        Accu-Chek Softclix Lancets lancets Use as instructed   atorvastatin 80 MG tablet Commonly known as:  LIPITOR Take 1 tablet (80 mg total) by mouth daily at 6 PM.   carvedilol 25 MG tablet Commonly known as:  COREG Take 1 tablet (25 mg total) by mouth 2 (two) times daily.   cetirizine 10 MG tablet Commonly known as:  ZYRTEC TAKE ONE TABLET BY MOUTH ONCE DAILY FOR ALLERGIES   clobetasol cream 0.05 % Commonly known as:  TEMOVATE APPLY TO AFFECED AREAS(S) TOPICALLY TWICE DAILY   ferrous sulfate 325 (65 FE) MG tablet Take 1 tablet (325 mg total) by mouth daily.   fluticasone 50 MCG/ACT nasal spray Commonly known as:  FLONASE USE TWO SPRAYS INTO EACH NOSTRILS DAILY   furosemide 80 MG tablet Commonly known as:  LASIX Take 80-160 mg by mouth 2 (two) times daily. 2 Tablets in the AM and 1 Tablet in the PM   GaviLAX 17 GM/SCOOP powder Generic drug:  polyethylene glycol powder TAKE ONE CAPFUL (17 GRAMS) BY MOUTH 2 (TWO) TIMES DAILY AS NEEDED.   glucose blood test strip Commonly known as:  Accu-Chek Aviva Plus 1 each by Other route 5 (five) times daily. Use as instructed   glycerin adult 2 g suppository Place 1 suppository rectally as needed for constipation.   hydrALAZINE 100 MG tablet Commonly known as:  APRESOLINE Take 1 tablet (100 mg total) by mouth 3 (three) times daily.   Insulin Glargine 100 UNIT/ML Solostar Pen Commonly known as:  Lantus  SoloStar Inject 45 Units into the skin daily. INJECT 30 UNITS UNDER THE SKIN EVERY MORNING AND INJECT 15 UNITS EVERY NIGHT   insulin lispro 100 UNIT/ML KwikPen Commonly known as:  HumaLOG KwikPen Inject 0.08 mLs (8 Units total) into the skin 3 (three) times daily. Max daily dose of 40 units daily   Insulin Pen Needle 31G X 5 MM Misc Commonly known as:  B-D UF III MINI PEN NEEDLES Four times daily   metolazone 5 MG tablet Commonly known as:  ZAROXOLYN Take 1 tablet (5 mg total) by mouth daily.   multivitamin-iron-minerals-folic acid chewable tablet Chew 1 tablet by mouth daily.   Olopatadine HCl 0.2 % Soln Apply 1 drop to eye daily.   oxycodone-acetaminophen 10-300  MG tablet Commonly known as:  LYNOX Take 1 tablet by mouth every 4 (four) hours as needed for pain.   phenylephrine 0.25 % suppository Commonly known as:  (USE for PREPARATION-H) Place 1 suppository rectally 2 (two) times daily as needed for hemorrhoids.   Potassium Chloride ER 20 MEQ Tbcr Take 20 mEq by mouth 2 (two) times daily.   pregabalin 75 MG capsule Commonly known as:  LYRICA Take 75 mg by mouth 3 (three) times daily.   verapamil 180 MG 24 hr capsule Commonly known as:  VERELAN PM Take 1 capsule (180 mg total) by mouth at bedtime. What changed:  how much to take        DATA REVIEWED:  Lab Results  Component Value Date   HGBA1C 6.4 (A) 12/09/2018    CREATININE 8.11 (H) 09/20/2018    ASSESSMENT / PLAN / RECOMMENDATIONS:   1) Type 2 Diabetes Mellitus, poorly controlled, With Retinopathy, CKD IV, and neuropathic complications, with improved hypoglycemia awareness - Most recent A1c of 6.4 %. Goal A1c < 7.5 %.    Plan: - Mr. Akram is not a safe insulin user, he has increased his lantus dose on his own from 30  Units to 45 units, he states that he takes humalog at meal time but also checks his glucose 2- hrs post meal and takes humalog if its not to his liking, when asked how much he gives, he  guess between 2-5 units, he also snacks and guess on how much to take with his snack. For example last night his BG was 188 mg/dL at bedtime, pt did not want to go to bed with such number and took 4 units of humalog and this am it was 45 mg/dL.  - Another example, he had a BG of 34 during the evening one time, this was explained by he took 10 units of Humalog and rode in the car with his partner to go to a restaurant but somehow didn't go and didn;t eat supper and by the time he got back home it was in the 30's.   - I discussed with him again that insulin and hypoglycemia could cause death. He was strongly discouraged from guessing or adjusting his insulin on his own, he was strongly discouraged in taking Humalog without eating , I also explained to him due to his CKDV he is at a higher risk for hypoglycemia, because insulin lasts in his system longer.  - I believe his lantus of 45 units is too much for him, but its hard to figure our when he takes humalog at bedtime as well.  - On a positive note, he is despite it all is having less hypoglycemic episodes then he used to, and he is more aware of them, but in the past he used to not feel them at all .  - To curb his guessing with humalog dosing, he will be provided with snack doses and correctional scale to be used in addition to his meal humalog dose.   MEDICATIONS:  Continue Lantus 45 units QAM  Decrease Humalog to 8 units with meals    For a snack take 2 units of Humalog   Humalog correctional insulin: ADD extra units on insulin to your meal-time Humalog dose if your blood sugars are higher than 190. Use the scale below to help guide you:   Blood sugar before meal Number of units to inject  Less than 190 0 unit  191 -  230 1 units  231 -  270 2 units  271 -  310 3 units    EDUCATION / INSTRUCTIONS:  BG monitoring instructions: Patient is instructed to check his blood sugars 4 times a day, fasting and bedtime.  Call [place]  Endocrinology clinic if: BG persistently < 70 or > 300. . I reviewed the Rule of 15 for the treatment of hypoglycemia in detail with the patient. Literature supplied.   2) Diabetic complications:   Eye: Does  have known diabetic retinopathy.   Neuro/ Feet: Does  have known diabetic peripheral neuropathy .   Renal: Patient does  have known baseline CKD.    3) Lipids: Patient is on a statin.  4) Hypertension: He is at goal of < 140/90 mmHg.    F/U in 3 months    Signed electronically by: Mack Guise, MD  [facility] End[facility]  [address] [place], [place] [address] Phone: [phone] FAX: [phone]   CC: Forrest Moron, MD [place] [address] Phone: [phone]  Fax: [phone]  Return to Endocrinology clinic as below: Future Appointments  Date Time Provider [place]  01/18/2019 10:50 AM Shamleffer, Melanie Crazier, MD [facility] None  02/09/2019  1:15 PM Marzetta Board, DPM [facility]  03/10/2019  2:00 PM Parrett, Fonnie Mu, NP [facility] None

## 2019-01-24 ENCOUNTER — Ambulatory Visit (INDEPENDENT_AMBULATORY_CARE_PROVIDER_SITE_OTHER): Payer: Medicare Other | Admitting: Adult Health

## 2019-01-24 ENCOUNTER — Encounter: Payer: Self-pay | Admitting: Adult Health

## 2019-01-24 ENCOUNTER — Other Ambulatory Visit: Payer: Self-pay

## 2019-01-24 VITALS — BP 142/82 | HR 63 | Temp 97.9°F | Ht 73.0 in | Wt 270.2 lb

## 2019-01-24 DIAGNOSIS — Z720 Tobacco use: Secondary | ICD-10-CM

## 2019-01-24 DIAGNOSIS — J449 Chronic obstructive pulmonary disease, unspecified: Secondary | ICD-10-CM | POA: Diagnosis not present

## 2019-01-24 DIAGNOSIS — G4733 Obstructive sleep apnea (adult) (pediatric): Secondary | ICD-10-CM

## 2019-01-24 MED ORDER — NICOTINE 21-14-7 MG/24HR TD KIT: 1.0000 | PACK | Freq: Every day | TRANSDERMAL | 0 refills | Status: AC

## 2019-01-24 NOTE — Patient Instructions (Addendum)
Adjust BIPAP pressure to IPAP 22cm, EPAP 12.  BIPAP download in 4 weeks .  Marland Kitchen  Continue on BIPAP . Wear each night for at least 4-6 hrs .  Work on not smoking .  Will send order for nicotine patches to help quit smoking.  Continue on Flonase 2 puffs daily  Continue on Zyrtec 10mg  daily  Try nasal saline spray As needed   Follow up with Dr. Elsworth Soho  Or Parrett NP in 4-6 months and As needed   Please contact office for sooner follow up if symptoms do not improve or worsen or seek emergency care

## 2019-01-24 NOTE — Assessment & Plan Note (Signed)
Prone to recurrent bronchitis and active smoker.  Discussed smoking cessation.  PFTs previously have not shown any significant obstruction mainly only restrictive process.  Previous CT chest mentions no evidence of ILD.  We will continue to monitor.  Recheck PFTs once COVID-19 precautions are lifted

## 2019-01-24 NOTE — Assessment & Plan Note (Signed)
Smoking cessation discussed 

## 2019-01-24 NOTE — Assessment & Plan Note (Signed)
Severe sleep apnea-excellent compliance.  Patient needs improved control.  Will adjust BiPAP settings and check a BiPAP download in 4 weeks  Plan  Patient Instructions  Adjust BIPAP pressure to IPAP 22cm, EPAP 12.  BIPAP download in 4 weeks .  Marland Kitchen  Continue on BIPAP . Wear each night for at least 4-6 hrs .  Work on not smoking .  Will send order for nicotine patches to help quit smoking.  Continue on Flonase 2 puffs daily  Continue on Zyrtec 10mg  daily  Try nasal saline spray As needed   Follow up with Dr. Elsworth Soho  Or Siyon Linck NP in 4-6 months and As needed   Please contact office for sooner follow up if symptoms do not improve or worsen or seek emergency care

## 2019-01-24 NOTE — Progress Notes (Signed)
@Patient  ID: Jerry Cantrell, male    DOB: [birth year], 53 y.o.   MRN: [id number]  Chief Complaint  Patient presents with  . Follow-up    OSA     Referring provider: Forrest Moron, MD  HPI: 53 year old male active smoker with polysubstance abuse (cocaine) followed for severe obstructive sleep apnea on nocturnal BiPAP, chronic hypoxic respiratory failure previously on O2 (d/c 2019 ) , stable pulmonary nodules ( serial CT chest x 3  Years 2016-2019 ), Chronic Bronchitis -active smoker   (Mostly Restrictive dz on PFT, CT )  Medical history significant for chronic diastolic heart failure and chronic kidney disease stage IV-has an AV fistula in the left arm  TEST/EVENTS :  CT chest 05/2015 -evidence of old granulomatous disease with calcified right hilar lymph nodes, bilateral subcentimeter nodules , some new compared to 02/2010 CT chest 10/2016   stable nodules , some calcified and probably benign CT chest 01/2018 -stable nodules   PSG 01/2015- severe OSA, AHI 99/hour, not corrected by CPAP required BiPAP 25/21 with a full face mask  PFTs 10/2015 >> no obsn, mod restriction, DLCO 59%. (no mention of ILD on Serial CT chest 2016 -2019 )  10/2015 2-D echo nml LVEF, grade 1 diastolic dysfunction  1/74/0814 Follow up : OSA, Chronic Bronchitis  Patient returns for a one-year follow-up for sleep apnea.  Patient says he is doing well on BiPAP. BiPAP download shows excellent compliance with daily average usage at 7 hours.  Patient is on IPAP 20, EPAP 5, pressure support 4.  AHI 20, positive leaks. Says he does not feel it is leaking . Says he sleeps well.  Discussed cleaning his mask and water chamber.   Patient has chronic kidney disease followed by nephrology. Says he will be starting Dialysis soon.   Patient is prone to recurrent bronchitis.  Patient is an active smoker.  We discussed smoking cessation.  He would like to try nicotine patches again.  He says it is helped him in the past.   Previous PFTs in 2017 showed no obstruction with moderate restriction.   Trying to stay active with yard work and gardening .   Has chronic allergies. On Flonase and Zyrtec says under control.    No Known Allergies  Immunization History  Administered Date(s) Administered  . Influenza Inj Mdck Quad Pf 06/18/2018  . Influenza Split 05/12/2012  . Influenza, Seasonal, Injecte, Preservative Fre 05/11/2013  . Influenza,inj,Quad PF,6+ Mos 05/11/2013, 05/15/2015, 06/16/2016, 05/10/2017  . Pneumococcal Conjugate-13 06/16/2016  . Pneumococcal Polysaccharide-23 06/16/2013, 01/19/2015  . Td 05/12/2012    Past Medical History:  Diagnosis Date  . Acute diastolic heart failure ([place])   . CHF (congestive heart failure) ([place])   . Diabetes mellitus with nephropathy ([place]) 05/12/2012   Dr. Justin Mend follows  . Hemorrhoids 05/12/2012  . Hyperlipidemia   . Hypertension   . Lumbar spinal stenosis 05/03/2012   Mild with only right L4 nerve root encroachment, no neurogenic claudication   . Lumbar spondylosis 05/03/2012  . Meralgia paraesthetica 05/03/2012  . Meralgia paraesthetica 05/03/2012   On Lyrica which does improve pain.   Marland Kitchen Neuropathy in diabetes ([place]) 05/12/2012  . Obesity   . Pneumonia   . Retinopathy   . Sleep apnea    uses cpap  . Substance abuse ([place])   . Urinary incontinence 05/12/2012   Since the start of Sept. 2013     Tobacco History: Social History   Tobacco Use  Smoking Status Current Every Day Smoker  .  Packs/day: 1.00  . Years: 30.00  . Pack years: 30.00  . Types: Cigarettes  Smokeless Tobacco Never Used  Tobacco Comment   "Trying - hard to stop"   Ready to quit: Yes Counseling given: Yes Comment: "Trying - hard to stop"   Outpatient Medications Prior to Visit  Medication Sig Dispense Refill  . Accu-Chek Softclix Lancets lancets Use as instructed 450 each 0  . atorvastatin (LIPITOR) 80 MG tablet Take 1 tablet (80 mg total) by mouth daily at 6 PM. 90 tablet 0  . calcitRIOL  (ROCALTROL) 0.25 MCG capsule Take 0.25 mcg by mouth daily.    . carvedilol (COREG) 25 MG tablet Take 1 tablet (25 mg total) by mouth 2 (two) times daily. 180 tablet 0  . cetirizine (ZYRTEC) 10 MG tablet TAKE ONE TABLET BY MOUTH ONCE DAILY FOR ALLERGIES 90 tablet 0  . cinacalcet (SENSIPAR) 60 MG tablet Take 60 mg by mouth daily.    . clobetasol cream (TEMOVATE) 0.05 % APPLY TO AFFECED AREAS(S) TOPICALLY TWICE DAILY 60 g 0  . ferrous sulfate 325 (65 FE) MG tablet Take 1 tablet (325 mg total) by mouth daily. 90 tablet 1  . fluticasone (FLONASE) 50 MCG/ACT nasal spray USE TWO SPRAYS INTO EACH NOSTRILS DAILY 16 g 6  . furosemide (LASIX) 80 MG tablet 2 Tablets in the AM and 2 Tablets at lunchtime    . GAVILAX powder TAKE ONE CAPFUL (17 GRAMS) BY MOUTH 2 (TWO) TIMES DAILY AS NEEDED. 510 g 1  . glucose blood (ACCU-CHEK AVIVA PLUS) test strip 1 each by Other route 5 (five) times daily. Use as instructed 450 each 0  . hydrALAZINE (APRESOLINE) 100 MG tablet Take 1 tablet (100 mg total) by mouth 3 (three) times daily. 270 tablet 0  . Insulin Glargine (LANTUS SOLOSTAR) 100 UNIT/ML Solostar Pen Inject 45 Units into the skin daily. INJECT 30 UNITS UNDER THE SKIN EVERY MORNING AND INJECT 15 UNITS EVERY NIGHT (Patient taking differently: Inject 45 Units into the skin daily. INJECT 45 UNITS UNDER THE SKIN EVERY MORNING) 15 mL 6  . insulin lispro (HUMALOG KWIKPEN) 100 UNIT/ML KwikPen Inject 0.08 mLs (8 Units total) into the skin 3 (three) times daily. Max daily dose of 40 units daily (Patient taking differently: Inject 10-20 Units into the skin 3 (three) times daily. Max daily dose of 40 units daily) 15 mL 6  . Insulin Pen Needle (B-D UF III MINI PEN NEEDLES) 31G X 5 MM MISC Four times daily 360 each 0  . metolazone (ZAROXOLYN) 5 MG tablet Take 1 tablet (5 mg total) by mouth daily. 90 tablet 1  . multivitamin-iron-minerals-folic acid (CENTRUM) chewable tablet Chew 1 tablet by mouth daily.    . Olopatadine HCl 0.2 %  SOLN Apply 1 drop to eye daily. 1 Bottle 3  . oxycodone-acetaminophen (LYNOX) 10-300 MG tablet Take 1 tablet by mouth every 4 (four) hours as needed for pain.    Marland Kitchen Potassium Chloride ER 20 MEQ TBCR Take 20 mEq by mouth 2 (two) times daily. 180 tablet 2  . pregabalin (LYRICA) 75 MG capsule Take 75 mg by mouth 3 (three) times daily.    . verapamil (VERELAN PM) 180 MG 24 hr capsule Take 1 capsule (180 mg total) by mouth at bedtime. (Patient taking differently: Take 360 mg by mouth at bedtime. ) 90 capsule 3  . glycerin adult 2 g suppository Place 1 suppository rectally as needed for constipation. (Patient not taking: Reported on 01/24/2019) 12 suppository 0  .  phenylephrine (,USE FOR PREPARATION-H,) 0.25 % suppository Place 1 suppository rectally 2 (two) times daily as needed for hemorrhoids. (Patient not taking: Reported on 01/24/2019) 24 suppository 2   No facility-administered medications prior to visit.      Review of Systems:   Constitutional:   No  weight loss, night sweats,  Fevers, chills,  +fatigue, or  lassitude.  HEENT:   No headaches,  Difficulty swallowing,  Tooth/dental problems, or  Sore throat,                No sneezing, itching, ear ache, nasal congestion, post nasal drip,   CV:  No chest pain,  Orthopnea, PND, swelling in lower extremities, anasarca, dizziness, palpitations, syncope.   GI  No heartburn, indigestion, abdominal pain, nausea, vomiting, diarrhea, change in bowel habits, loss of appetite, bloody stools.   Resp:   No excess mucus, no productive cough,  No non-productive cough,  No coughing up of blood.  No change in color of mucus.  No wheezing.  No chest wall deformity  Skin: no rash or lesions.  GU: no dysuria, change in color of urine, no urgency or frequency.  No flank pain, no hematuria   MS:  No joint pain or swelling.  No decreased range of motion.  No back pain.    Physical Exam  BP (!) 142/82 (BP Location: Right Arm, Cuff Size: Normal)   Pulse 63    Temp 97.9 F (36.6 C) (Oral)   Ht 6\' 1"  (1.854 m)   Wt 270 lb 3.2 oz (122.6 kg)   SpO2 97%   BMI 35.65 kg/m   GEN: A/Ox3; pleasant , NAD, obese    HEENT:  [place]/AT,  EACs-clear, TMs-wnl, NOSE-clear, THROAT-clear, no lesions, no postnasal drip or exudate noted.   NECK:  Supple w/ fair ROM; no JVD; normal carotid impulses w/o bruits; no thyromegaly or nodules palpated; no lymphadenopathy.    RESP  Clear  P & A; w/o, wheezes/ rales/ or rhonchi. no accessory muscle use, no dullness to percussion  CARD:  RRR, no m/r/g, no peripheral edema, pulses intact, no cyanosis or clubbing.  GI:   Soft & nt; nml bowel sounds; no organomegaly or masses detected.   Musco: Warm bil, no deformities or joint swelling noted.   Neuro: alert, no focal deficits noted.    Skin: Warm, no lesions or rashes    Lab Results:  CBC  ProBNP    Component Value Date/Time   PROBNP 225.0 (H) 07/16/2015 1158    Imaging: No results found.    PFT Results Latest Ref Rng & Units 10/28/2015  FVC-Pre L 2.88  FVC-Predicted Pre % 54  FVC-Post L 2.81  FVC-Predicted Post % 52  Pre FEV1/FVC % % 82  Post FEV1/FCV % % 82  FEV1-Pre L 2.38  FEV1-Predicted Pre % 57  FEV1-Post L 2.30  DLCO UNC% % 59  DLCO COR %Predicted % 98  TLC L 5.45  TLC % Predicted % 74  RV % Predicted % 114    No results found for: NITRICOXIDE      Assessment & Plan:   Obstructive sleep apnea Severe sleep apnea-excellent compliance.  Patient needs improved control.  Will adjust BiPAP settings and check a BiPAP download in 4 weeks  Plan  Patient Instructions  Adjust BIPAP pressure to IPAP 22cm, EPAP 12.  BIPAP download in 4 weeks .  Marland Kitchen  Continue on BIPAP . Wear each night for at least 4-6 hrs .  Work on  not smoking .  Will send order for nicotine patches to help quit smoking.  Continue on Flonase 2 puffs daily  Continue on Zyrtec 10mg  daily  Try nasal saline spray As needed   Follow up with Dr. Elsworth Soho  Or Damaris Geers NP in 4-6  months and As needed   Please contact office for sooner follow up if symptoms do not improve or worsen or seek emergency care          Tobacco abuse Smoking cessation discussed  COPD (chronic obstructive pulmonary disease) (HCC) Prone to recurrent bronchitis and active smoker.  Discussed smoking cessation.  PFTs previously have not shown any significant obstruction mainly only restrictive process.  Previous CT chest mentions no evidence of ILD.  We will continue to monitor.  Recheck PFTs once COVID-19 precautions are lifted     Rexene Edison, NP 01/24/2019

## 2019-01-25 ENCOUNTER — Telehealth: Payer: Self-pay | Admitting: Adult Health

## 2019-01-25 DIAGNOSIS — N185 Chronic kidney disease, stage 5: Secondary | ICD-10-CM | POA: Diagnosis not present

## 2019-01-25 DIAGNOSIS — I120 Hypertensive chronic kidney disease with stage 5 chronic kidney disease or end stage renal disease: Secondary | ICD-10-CM | POA: Diagnosis not present

## 2019-01-25 DIAGNOSIS — E1122 Type 2 diabetes mellitus with diabetic chronic kidney disease: Secondary | ICD-10-CM | POA: Diagnosis not present

## 2019-01-25 DIAGNOSIS — D631 Anemia in chronic kidney disease: Secondary | ICD-10-CM | POA: Diagnosis not present

## 2019-01-25 DIAGNOSIS — N2581 Secondary hyperparathyroidism of renal origin: Secondary | ICD-10-CM | POA: Diagnosis not present

## 2019-01-25 NOTE — Telephone Encounter (Signed)
Called pharmacy spoke with pharmacist Directions 4 weeks step 1/ 2 weeks step 2/ 2 weeks step 3. 21/14/7 Nothing further needed.

## 2019-02-01 DIAGNOSIS — M545 Low back pain: Secondary | ICD-10-CM | POA: Diagnosis not present

## 2019-02-01 DIAGNOSIS — M5431 Sciatica, right side: Secondary | ICD-10-CM | POA: Diagnosis not present

## 2019-02-01 DIAGNOSIS — Z79891 Long term (current) use of opiate analgesic: Secondary | ICD-10-CM | POA: Diagnosis not present

## 2019-02-01 DIAGNOSIS — G890 Central pain syndrome: Secondary | ICD-10-CM | POA: Diagnosis not present

## 2019-02-01 DIAGNOSIS — G894 Chronic pain syndrome: Secondary | ICD-10-CM | POA: Diagnosis not present

## 2019-02-01 DIAGNOSIS — M5432 Sciatica, left side: Secondary | ICD-10-CM | POA: Diagnosis not present

## 2019-02-01 DIAGNOSIS — G8929 Other chronic pain: Secondary | ICD-10-CM | POA: Diagnosis not present

## 2019-02-06 NOTE — Progress Notes (Signed)
Patient refused service

## 2019-02-09 ENCOUNTER — Ambulatory Visit: Payer: Medicare Other | Admitting: Podiatry

## 2019-02-20 ENCOUNTER — Other Ambulatory Visit: Payer: Self-pay

## 2019-02-20 ENCOUNTER — Inpatient Hospital Stay (HOSPITAL_COMMUNITY)
Admission: EM | Admit: 2019-02-20 | Discharge: 2019-02-24 | DRG: 291 | Disposition: A | Discharge status: Home / Self Care | Payer: Medicare Other | Attending: Family Medicine | Admitting: Family Medicine

## 2019-02-20 ENCOUNTER — Emergency Department (HOSPITAL_COMMUNITY): Payer: Medicare Other

## 2019-02-20 ENCOUNTER — Encounter (HOSPITAL_COMMUNITY): Payer: Self-pay | Admitting: Emergency Medicine

## 2019-02-20 DIAGNOSIS — E8779 Other fluid overload: Secondary | ICD-10-CM | POA: Diagnosis not present

## 2019-02-20 DIAGNOSIS — E8770 Fluid overload, unspecified: Secondary | ICD-10-CM | POA: Diagnosis present

## 2019-02-20 DIAGNOSIS — J449 Chronic obstructive pulmonary disease, unspecified: Secondary | ICD-10-CM | POA: Diagnosis present

## 2019-02-20 DIAGNOSIS — F17210 Nicotine dependence, cigarettes, uncomplicated: Secondary | ICD-10-CM | POA: Diagnosis present

## 2019-02-20 DIAGNOSIS — R7989 Other specified abnormal findings of blood chemistry: Secondary | ICD-10-CM | POA: Diagnosis not present

## 2019-02-20 DIAGNOSIS — Z833 Family history of diabetes mellitus: Secondary | ICD-10-CM | POA: Diagnosis not present

## 2019-02-20 DIAGNOSIS — Z825 Family history of asthma and other chronic lower respiratory diseases: Secondary | ICD-10-CM | POA: Diagnosis not present

## 2019-02-20 DIAGNOSIS — E162 Hypoglycemia, unspecified: Secondary | ICD-10-CM

## 2019-02-20 DIAGNOSIS — G4730 Sleep apnea, unspecified: Secondary | ICD-10-CM | POA: Diagnosis present

## 2019-02-20 DIAGNOSIS — D631 Anemia in chronic kidney disease: Secondary | ICD-10-CM | POA: Diagnosis not present

## 2019-02-20 DIAGNOSIS — D696 Thrombocytopenia, unspecified: Secondary | ICD-10-CM | POA: Diagnosis present

## 2019-02-20 DIAGNOSIS — E11649 Type 2 diabetes mellitus with hypoglycemia without coma: Secondary | ICD-10-CM | POA: Diagnosis present

## 2019-02-20 DIAGNOSIS — R609 Edema, unspecified: Secondary | ICD-10-CM | POA: Diagnosis not present

## 2019-02-20 DIAGNOSIS — IMO0002 Reserved for concepts with insufficient information to code with codable children: Secondary | ICD-10-CM

## 2019-02-20 DIAGNOSIS — I132 Hypertensive heart and chronic kidney disease with heart failure and with stage 5 chronic kidney disease, or end stage renal disease: Principal | ICD-10-CM | POA: Diagnosis present

## 2019-02-20 DIAGNOSIS — R778 Other specified abnormalities of plasma proteins: Secondary | ICD-10-CM

## 2019-02-20 DIAGNOSIS — Z79891 Long term (current) use of opiate analgesic: Secondary | ICD-10-CM

## 2019-02-20 DIAGNOSIS — J9601 Acute respiratory failure with hypoxia: Secondary | ICD-10-CM | POA: Diagnosis present

## 2019-02-20 DIAGNOSIS — E1142 Type 2 diabetes mellitus with diabetic polyneuropathy: Secondary | ICD-10-CM | POA: Diagnosis not present

## 2019-02-20 DIAGNOSIS — Z7951 Long term (current) use of inhaled steroids: Secondary | ICD-10-CM | POA: Diagnosis not present

## 2019-02-20 DIAGNOSIS — E1165 Type 2 diabetes mellitus with hyperglycemia: Secondary | ICD-10-CM | POA: Diagnosis not present

## 2019-02-20 DIAGNOSIS — E1122 Type 2 diabetes mellitus with diabetic chronic kidney disease: Secondary | ICD-10-CM | POA: Diagnosis not present

## 2019-02-20 DIAGNOSIS — E11319 Type 2 diabetes mellitus with unspecified diabetic retinopathy without macular edema: Secondary | ICD-10-CM | POA: Diagnosis present

## 2019-02-20 DIAGNOSIS — E669 Obesity, unspecified: Secondary | ICD-10-CM | POA: Diagnosis present

## 2019-02-20 DIAGNOSIS — N186 End stage renal disease: Secondary | ICD-10-CM | POA: Diagnosis not present

## 2019-02-20 DIAGNOSIS — I10 Essential (primary) hypertension: Secondary | ICD-10-CM | POA: Diagnosis present

## 2019-02-20 DIAGNOSIS — Z794 Long term (current) use of insulin: Secondary | ICD-10-CM

## 2019-02-20 DIAGNOSIS — M545 Low back pain, unspecified: Secondary | ICD-10-CM | POA: Diagnosis present

## 2019-02-20 DIAGNOSIS — N184 Chronic kidney disease, stage 4 (severe): Secondary | ICD-10-CM | POA: Diagnosis not present

## 2019-02-20 DIAGNOSIS — N2581 Secondary hyperparathyroidism of renal origin: Secondary | ICD-10-CM | POA: Diagnosis not present

## 2019-02-20 DIAGNOSIS — M48061 Spinal stenosis, lumbar region without neurogenic claudication: Secondary | ICD-10-CM | POA: Diagnosis not present

## 2019-02-20 DIAGNOSIS — H548 Legal blindness, as defined in USA: Secondary | ICD-10-CM | POA: Diagnosis not present

## 2019-02-20 DIAGNOSIS — R0600 Dyspnea, unspecified: Secondary | ICD-10-CM

## 2019-02-20 DIAGNOSIS — G894 Chronic pain syndrome: Secondary | ICD-10-CM | POA: Diagnosis present

## 2019-02-20 DIAGNOSIS — Z811 Family history of alcohol abuse and dependence: Secondary | ICD-10-CM

## 2019-02-20 DIAGNOSIS — Z8042 Family history of malignant neoplasm of prostate: Secondary | ICD-10-CM

## 2019-02-20 DIAGNOSIS — Z6837 Body mass index (BMI) 37.0-37.9, adult: Secondary | ICD-10-CM

## 2019-02-20 DIAGNOSIS — Z1159 Encounter for screening for other viral diseases: Secondary | ICD-10-CM | POA: Diagnosis not present

## 2019-02-20 DIAGNOSIS — N179 Acute kidney failure, unspecified: Secondary | ICD-10-CM | POA: Diagnosis present

## 2019-02-20 DIAGNOSIS — R0902 Hypoxemia: Secondary | ICD-10-CM | POA: Diagnosis not present

## 2019-02-20 DIAGNOSIS — R011 Cardiac murmur, unspecified: Secondary | ICD-10-CM | POA: Diagnosis present

## 2019-02-20 DIAGNOSIS — E785 Hyperlipidemia, unspecified: Secondary | ICD-10-CM | POA: Diagnosis present

## 2019-02-20 DIAGNOSIS — I5033 Acute on chronic diastolic (congestive) heart failure: Secondary | ICD-10-CM | POA: Diagnosis present

## 2019-02-20 DIAGNOSIS — R0602 Shortness of breath: Secondary | ICD-10-CM | POA: Diagnosis not present

## 2019-02-20 DIAGNOSIS — G8929 Other chronic pain: Secondary | ICD-10-CM | POA: Diagnosis present

## 2019-02-20 LAB — CBC WITH DIFFERENTIAL/PLATELET
Abs Immature Granulocytes: 0.03 10*3/uL (ref 0.00–0.07)
Basophils Absolute: 0 10*3/uL (ref 0.0–0.1)
Basophils Relative: 0 %
Eosinophils Absolute: 0.1 10*3/uL (ref 0.0–0.5)
Eosinophils Relative: 2 %
HCT: 33.3 % — ABNORMAL LOW (ref 39.0–52.0)
Hemoglobin: 10.1 g/dL — ABNORMAL LOW (ref 13.0–17.0)
Immature Granulocytes: 1 %
Lymphocytes Relative: 19 %
Lymphs Abs: 1.1 10*3/uL (ref 0.7–4.0)
MCH: 29.7 pg (ref 26.0–34.0)
MCHC: 30.3 g/dL (ref 30.0–36.0)
MCV: 97.9 fL (ref 80.0–100.0)
Monocytes Absolute: 0.6 10*3/uL (ref 0.1–1.0)
Monocytes Relative: 10 %
Neutro Abs: 4 10*3/uL (ref 1.7–7.7)
Neutrophils Relative %: 68 %
Platelets: 109 10*3/uL — ABNORMAL LOW (ref 150–400)
RBC: 3.4 MIL/uL — ABNORMAL LOW (ref 4.22–5.81)
RDW: 13.3 % (ref 11.5–15.5)
WBC: 5.7 10*3/uL (ref 4.0–10.5)
nRBC: 0 % (ref 0.0–0.2)

## 2019-02-20 LAB — COMPREHENSIVE METABOLIC PANEL
ALT: 41 U/L (ref 0–44)
AST: 42 U/L — ABNORMAL HIGH (ref 15–41)
Albumin: 3.3 g/dL — ABNORMAL LOW (ref 3.5–5.0)
Alkaline Phosphatase: 91 U/L (ref 38–126)
Anion gap: 12 (ref 5–15)
BUN: 76 mg/dL — ABNORMAL HIGH (ref 6–20)
CO2: 25 mmol/L (ref 22–32)
Calcium: 8.4 mg/dL — ABNORMAL LOW (ref 8.9–10.3)
Chloride: 107 mmol/L (ref 98–111)
Creatinine, Ser: 8.88 mg/dL — ABNORMAL HIGH (ref 0.61–1.24)
GFR calc Af Amer: 7 mL/min — ABNORMAL LOW (ref >60)
GFR calc non Af Amer: 6 mL/min — ABNORMAL LOW (ref >60)
Glucose, Bld: 58 mg/dL — ABNORMAL LOW (ref 70–99)
Potassium: 4.7 mmol/L (ref 3.5–5.1)
Sodium: 144 mmol/L (ref 135–145)
Total Bilirubin: 0.4 mg/dL (ref 0.3–1.2)
Total Protein: 6.7 g/dL (ref 6.5–8.1)

## 2019-02-20 LAB — CBC
HCT: 32.3 % — ABNORMAL LOW (ref 39.0–52.0)
Hemoglobin: 10.1 g/dL — ABNORMAL LOW (ref 13.0–17.0)
MCH: 29.7 pg (ref 26.0–34.0)
MCHC: 31.3 g/dL (ref 30.0–36.0)
MCV: 95 fL (ref 80.0–100.0)
Platelets: 104 10*3/uL — ABNORMAL LOW (ref 150–400)
RBC: 3.4 MIL/uL — ABNORMAL LOW (ref 4.22–5.81)
RDW: 13.2 % (ref 11.5–15.5)
WBC: 6 10*3/uL (ref 4.0–10.5)
nRBC: 0 % (ref 0.0–0.2)

## 2019-02-20 LAB — TROPONIN I: Troponin I: 0.03 ng/mL (ref <0.03)

## 2019-02-20 LAB — LACTIC ACID, PLASMA: Lactic Acid, Venous: 1 mmol/L (ref 0.5–1.9)

## 2019-02-20 LAB — GLUCOSE, CAPILLARY: Glucose-Capillary: 180 mg/dL — ABNORMAL HIGH (ref 70–99)

## 2019-02-20 LAB — SARS CORONAVIRUS 2 BY RT PCR (HOSPITAL ORDER, PERFORMED IN ~~LOC~~ HOSPITAL LAB): SARS Coronavirus 2: NEGATIVE

## 2019-02-20 LAB — BRAIN NATRIURETIC PEPTIDE: B Natriuretic Peptide: 457.1 pg/mL — ABNORMAL HIGH (ref 0.0–100.0)

## 2019-02-20 LAB — CBG MONITORING, ED
Glucose-Capillary: 35 mg/dL — CL (ref 70–99)
Glucose-Capillary: 113 mg/dL — ABNORMAL HIGH (ref 70–99)

## 2019-02-20 MED ORDER — ONDANSETRON HCL 4 MG/2ML IJ SOLN: 4.0000 mg | Freq: Four times a day (QID) | INTRAMUSCULAR | Status: DC | PRN

## 2019-02-20 MED ORDER — ATORVASTATIN CALCIUM 80 MG PO TABS
80.0000 mg | ORAL_TABLET | Freq: Every day | ORAL | Status: DC
  Administered 2019-02-21 – 2019-02-24 (×4): 80 mg via ORAL
  Filled 2019-02-20 (×4): qty 1

## 2019-02-20 MED ORDER — FERROUS SULFATE 325 (65 FE) MG PO TABS
325.0000 mg | ORAL_TABLET | Freq: Every day | ORAL | Status: DC
  Administered 2019-02-21: 325 mg via ORAL
  Filled 2019-02-20: qty 1

## 2019-02-20 MED ORDER — DEXTROSE 50 % IV SOLN
INTRAVENOUS | Status: AC
  Filled 2019-02-20: qty 50

## 2019-02-20 MED ORDER — INSULIN ASPART 100 UNIT/ML ~~LOC~~ SOLN: 0.0000 [IU] | Freq: Every day | SUBCUTANEOUS | Status: DC

## 2019-02-20 MED ORDER — SODIUM CHLORIDE 0.9 % IV SOLN: 250.0000 mL | INTRAVENOUS | Status: DC | PRN

## 2019-02-20 MED ORDER — CARVEDILOL 25 MG PO TABS
25.0000 mg | ORAL_TABLET | Freq: Two times a day (BID) | ORAL | Status: DC
  Administered 2019-02-20 – 2019-02-22 (×4): 25 mg via ORAL
  Filled 2019-02-20 (×4): qty 1

## 2019-02-20 MED ORDER — HEPARIN SODIUM (PORCINE) 5000 UNIT/ML IJ SOLN
5000.0000 [IU] | Freq: Three times a day (TID) | INTRAMUSCULAR | Status: DC
  Administered 2019-02-20 – 2019-02-24 (×8): 5000 [IU] via SUBCUTANEOUS
  Filled 2019-02-20 (×7): qty 1

## 2019-02-20 MED ORDER — SODIUM CHLORIDE 0.9% FLUSH: 3.0000 mL | INTRAVENOUS | Status: DC | PRN

## 2019-02-20 MED ORDER — SODIUM CHLORIDE 0.9% FLUSH
3.0000 mL | Freq: Two times a day (BID) | INTRAVENOUS | Status: DC
  Administered 2019-02-22: 3 mL via INTRAVENOUS

## 2019-02-20 MED ORDER — HYDRALAZINE HCL 50 MG PO TABS
100.0000 mg | ORAL_TABLET | Freq: Three times a day (TID) | ORAL | Status: DC
  Administered 2019-02-20 – 2019-02-24 (×10): 100 mg via ORAL
  Filled 2019-02-20 (×10): qty 2

## 2019-02-20 MED ORDER — INSULIN GLARGINE 100 UNIT/ML ~~LOC~~ SOLN
15.0000 [IU] | Freq: Two times a day (BID) | SUBCUTANEOUS | Status: DC
  Filled 2019-02-20 (×3): qty 0.15

## 2019-02-20 MED ORDER — METOLAZONE 5 MG PO TABS
5.0000 mg | ORAL_TABLET | Freq: Every day | ORAL | Status: DC
  Administered 2019-02-21: 5 mg via ORAL
  Filled 2019-02-20: qty 1

## 2019-02-20 MED ORDER — DEXTROSE 50 % IV SOLN
50.0000 mL | Freq: Once | INTRAVENOUS | Status: AC
  Administered 2019-02-20: 50 mL via INTRAVENOUS

## 2019-02-20 MED ORDER — ONDANSETRON HCL 4 MG PO TABS: 4.0000 mg | ORAL_TABLET | Freq: Four times a day (QID) | ORAL | Status: DC | PRN

## 2019-02-20 MED ORDER — ACETAMINOPHEN 325 MG PO TABS: 650.0000 mg | ORAL_TABLET | Freq: Four times a day (QID) | ORAL | Status: DC | PRN

## 2019-02-20 MED ORDER — SODIUM CHLORIDE 0.9% FLUSH
3.0000 mL | Freq: Two times a day (BID) | INTRAVENOUS | Status: DC
  Administered 2019-02-20 – 2019-02-21 (×3): 3 mL via INTRAVENOUS
  Administered 2019-02-22: 10 mL via INTRAVENOUS
  Administered 2019-02-23 (×2): 3 mL via INTRAVENOUS

## 2019-02-20 MED ORDER — SENNOSIDES-DOCUSATE SODIUM 8.6-50 MG PO TABS
1.0000 | ORAL_TABLET | Freq: Every evening | ORAL | Status: DC | PRN
  Administered 2019-02-22 (×2): 1 via ORAL
  Filled 2019-02-20 (×2): qty 1

## 2019-02-20 MED ORDER — VERAPAMIL HCL ER 180 MG PO TBCR
360.0000 mg | EXTENDED_RELEASE_TABLET | Freq: Every day | ORAL | Status: DC
  Administered 2019-02-20 – 2019-02-23 (×4): 360 mg via ORAL
  Filled 2019-02-20 (×5): qty 2

## 2019-02-20 MED ORDER — FUROSEMIDE 80 MG PO TABS
160.0000 mg | ORAL_TABLET | Freq: Two times a day (BID) | ORAL | Status: DC
  Administered 2019-02-21: 160 mg via ORAL
  Filled 2019-02-20: qty 2

## 2019-02-20 MED ORDER — ACETAMINOPHEN 650 MG RE SUPP: 650.0000 mg | Freq: Four times a day (QID) | RECTAL | Status: DC | PRN

## 2019-02-20 MED ORDER — INSULIN ASPART 100 UNIT/ML ~~LOC~~ SOLN: 0.0000 [IU] | Freq: Three times a day (TID) | SUBCUTANEOUS | Status: DC

## 2019-02-20 MED ORDER — OXYCODONE HCL 5 MG PO TABS
10.0000 mg | ORAL_TABLET | Freq: Three times a day (TID) | ORAL | Status: DC
  Administered 2019-02-20 – 2019-02-24 (×12): 10 mg via ORAL
  Filled 2019-02-20 (×10): qty 2

## 2019-02-20 NOTE — ED Notes (Signed)
ED Provider at bedside. 

## 2019-02-20 NOTE — H&P (Signed)
History and Physical    Trai Ells LGX:[id number] DOB: October 31, 1965 DOA: 02/20/2019  PCP: Forrest Moron, MD  Patient coming from: Home  Chief Complaint: Volume overload, shortness of breath   HPI: Jerry Cantrell is a 53 y.o. 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 Justin Mend, had a left upper extremity fistula placed about 2 years ago but had not initiated dialysis yet.  He states that he started feeling worsening shortness of breath starting Friday.  He was out of town visiting his aunt in [place].  He also noticed worsening swelling of his peripheral extremities as well as his abdomen.  He has been taking Lasix and has been making urine but not enough.  He denies any fevers or close contacts with COVID-19.  He admits to right-sided chest pain as well as a dry cough.  No nausea, vomiting or abdominal pain.  ED Course: Labs obtained creatinine 8.88, BNP 457, troponin 0 0.03.  Case discussed with nephrology and they will see him in consult at [facility] for likely initiation of dialysis.  Review of Systems: As per HPI otherwise 10 point review of systems negative.   Past Medical History:  Diagnosis Date  . Acute diastolic heart failure ([place])   . CHF (congestive heart failure) ([place])   . Diabetes mellitus with nephropathy ([place]) 05/12/2012   Dr. Justin Mend follows  . Hemorrhoids 05/12/2012  . Hyperlipidemia   . Hypertension   . Lumbar spinal stenosis 05/03/2012   Mild with only right L4 nerve root encroachment, no neurogenic claudication   . Lumbar spondylosis 05/03/2012  . Meralgia paraesthetica 05/03/2012  . Meralgia paraesthetica 05/03/2012   On Lyrica which does improve pain.   Mar[place] Kitchen Neuropathy in diabetes ([place]) 05/12/2012  . Obesity   . Pneumonia   . Retinopathy   . Sleep apnea    uses cpap  . Substance abuse ([place])   . Urinary incontinence 05/12/2012   Since the start of Sept. 2013     Past Surgical History:  Procedure Laterality Date  .  ABDOMINAL SURGERY     Abscess I&D 2/2 infected hair  . AV FISTULA PLACEMENT Left 02/06/2016   Procedure: LEFT ARM RADIOCEPHALIC ARTERIOVENOUS (AV) FISTULA CREATION;  Surgeon: Rosetta Posner, MD;  Location: [facility];  Service: Vascular;  Laterality: Left;  . COLONOSCOPY W/ POLYPECTOMY     pt to bring records  . COLONOSCOPY WITH PROPOFOL N/A 06/05/2016   Procedure: COLONOSCOPY WITH PROPOFOL;  Surgeon: Doran Stabler, MD;  Location: [facility];  Service: Gastroenterology;  Laterality: N/A;  . TONSILLECTOMY       reports that he has been smoking cigarettes. He has a 30.00 pack-year smoking history. He has never used smokeless tobacco. He reports that he does not drink alcohol or use drugs.  No Known Allergies  Family History  Problem Relation Age of Onset  . Prostate cancer Father   . Alcohol abuse Father   . Emphysema Father        smoked  . Diabetes Sister   . Colon cancer Neg Hx   . Colon polyps Neg Hx   . Stomach cancer Neg Hx   . Rectal cancer Neg Hx     Prior to Admission medications   Medication Sig Start Date End Date Taking? Authorizing Provider  Accu-Chek Softclix Lancets lancets Use as instructed 11/17/18   Forrest Moron, MD  atorvastatin (LIPITOR) 80 MG tablet Take 1 tablet (80 mg total) by mouth  daily at 6 PM. 11/17/18   Forrest Moron, MD  calcitRIOL (ROCALTROL) 0.25 MCG capsule Take 0.25 mcg by mouth daily. 12/30/18   [provider]  carvedilol (COREG) 25 MG tablet Take 1 tablet (25 mg total) by mouth 2 (two) times daily. 11/17/18   Forrest Moron, MD  cetirizine (ZYRTEC) 10 MG tablet TAKE ONE TABLET BY MOUTH ONCE DAILY FOR ALLERGIES 12/09/18   Forrest Moron, MD  cinacalcet (SENSIPAR) 60 MG tablet Take 60 mg by mouth daily. 01/05/19   [provider]  clobetasol cream (TEMOVATE) 0.05 % APPLY TO AFFECED AREAS(S) TOPICALLY TWICE DAILY 11/17/18   Delia Chimes A, MD  ferrous sulfate 325 (65 FE) MG tablet Take 1 tablet (325 mg total) by mouth daily.  08/18/18   Forrest Moron, MD  fluticasone (FLONASE) 50 MCG/ACT nasal spray USE TWO SPRAYS INTO EACH NOSTRILS DAILY 11/17/18   Delia Chimes A, MD  furosemide (LASIX) 80 MG tablet 2 Tablets in the AM and 2 Tablets at lunchtime    [provider]  GAVILAX powder TAKE ONE CAPFUL (17 GRAMS) BY MOUTH 2 (TWO) TIMES DAILY AS NEEDED. 11/28/18   Wendie Agreste, MD  glucose blood (ACCU-CHEK AVIVA PLUS) test strip 1 each by Other route 5 (five) times daily. Use as instructed 11/17/18   Delia Chimes A, MD  glycerin adult 2 g suppository Place 1 suppository rectally as needed for constipation. Patient not taking: Reported on 01/24/2019 10/18/18   Wendie Agreste, MD  hydrALAZINE (APRESOLINE) 100 MG tablet Take 1 tablet (100 mg total) by mouth 3 (three) times daily. 11/17/18   Forrest Moron, MD  Insulin Glargine (LANTUS SOLOSTAR) 100 UNIT/ML Solostar Pen Inject 45 Units into the skin daily. INJECT 30 UNITS UNDER THE SKIN EVERY MORNING AND INJECT 15 UNITS EVERY NIGHT Patient taking differently: Inject 45 Units into the skin daily. INJECT 45 UNITS UNDER THE SKIN EVERY MORNING 10/20/18   Shamleffer, Melanie Crazier, MD  insulin lispro (HUMALOG KWIKPEN) 100 UNIT/ML KwikPen Inject 0.08 mLs (8 Units total) into the skin 3 (three) times daily. Max daily dose of 40 units daily Patient taking differently: Inject 10-20 Units into the skin 3 (three) times daily. Max daily dose of 40 units daily 10/20/18   Shamleffer, Melanie Crazier, MD  Insulin Pen Needle (B-D UF III MINI PEN NEEDLES) 31G X 5 MM MISC Four times daily 11/17/18   Forrest Moron, MD  metolazone (ZAROXOLYN) 5 MG tablet Take 1 tablet (5 mg total) by mouth daily. 06/13/17   Forrest Moron, MD  multivitamin-iron-minerals-folic acid (CENTRUM) chewable tablet Chew 1 tablet by mouth daily.    [provider]  Nicotine 21-14-7 MG/24HR KIT Place 1 patch onto the skin daily. 01/24/19   Parrett, Fonnie Mu, NP  Olopatadine HCl 0.2 % SOLN Apply 1  drop to eye daily. 12/09/18   Forrest Moron, MD  oxycodone-acetaminophen (LYNOX) 10-300 MG tablet Take 1 tablet by mouth every 4 (four) hours as needed for pain.    [provider]  phenylephrine (,USE FOR PREPARATION-H,) 0.25 % suppository Place 1 suppository rectally 2 (two) times daily as needed for hemorrhoids. Patient not taking: Reported on 01/24/2019 12/21/18   Doran Stabler, MD  Potassium Chloride ER 20 MEQ TBCR Take 20 mEq by mouth 2 (two) times daily. 10/08/17   Forrest Moron, MD  pregabalin (LYRICA) 75 MG capsule Take 75 mg by mouth 3 (three) times daily.    [provider]  verapamil (VERELAN PM) 180 MG 24 hr capsule Take 1 capsule (180 mg total) by mouth at bedtime. Patient taking differently: Take 360 mg by mouth at bedtime.  08/18/18   Forrest Moron, MD    Physical Exam: Vitals:   02/20/19 1418 02/20/19 1419 02/20/19 1504 02/20/19 1530  BP: (!) 167/89  (!) 172/98 (!) 179/95  Pulse: 65  63 66  Resp: 18  16 18   Temp: 98.6 F (37 C)     TempSrc: Oral     SpO2: 99%  98% 95%  Weight:  124.7 kg    Height:  6' (1.829 m)       Constitutional: NAD, calm, comfortable Eyes: PERRL, lids and conjunctivae normal ENMT: Mucous membranes are moist. Posterior pharynx clear of any exudate or lesions.Normal dentition.  Neck: normal, supple, no masses, no thyromegaly Respiratory: Diminished breath sounds right lower base, on nasal cannula O2 1.5 L, with some conversational dyspnea Cardiovascular: Regular rate and rhythm, no murmurs / rubs / gallops.  Pitting extremity edema.  Abdomen: no tenderness, no masses palpated. No hepatosplenomegaly. Bowel sounds positive. + Mild abdominal distention Musculoskeletal: no clubbing / cyanosis. No joint deformity upper and lower extremities. Good ROM, no contractures. Normal muscle tone. + Palpable thrill left wrist Skin: no rashes, lesions, ulcers. No induration Neurologic: CN 2-12 grossly intact.  Speech clear.  Nonfocal  examination Psychiatric: Normal judgment and insight. Alert and oriented x 3. Normal mood.   Labs on Admission: I have personally reviewed following labs and imaging studies  CBC: Recent Labs  Lab 02/20/19 1453  WBC 5.7  NEUTROABS 4.0  HGB 10.1*  HCT 33.3*  MCV 97.9  PLT 982*   Basic Metabolic Panel: Recent Labs  Lab 02/20/19 1453  NA 144  K 4.7  CL 107  CO2 25  GLUCOSE 58*  BUN 76*  CREATININE 8.88*  CALCIUM 8.4*   GFR: Estimated Creatinine Clearance: 13.1 mL/min (A) (by C-G formula based on SCr of 8.88 mg/dL (H)). Liver Function Tests: Recent Labs  Lab 02/20/19 1453  AST 42*  ALT 41  ALKPHOS 91  BILITOT 0.4  PROT 6.7  ALBUMIN 3.3*   No results for input(s): LIPASE, AMYLASE in the last 168 hours. No results for input(s): AMMONIA in the last 168 hours. Coagulation Profile: No results for input(s): INR, PROTIME in the last 168 hours. Cardiac Enzymes: Recent Labs  Lab 02/20/19 1453  TROPONINI 0.03*   BNP (last 3 results) No results for input(s): PROBNP in the last 8760 hours. HbA1C: No results for input(s): HGBA1C in the last 72 hours. CBG: Recent Labs  Lab 02/20/19 1536 02/20/19 1636  GLUCAP 35* 113*   Lipid Profile: No results for input(s): CHOL, HDL, LDLCALC, TRIG, CHOLHDL, LDLDIRECT in the last 72 hours. Thyroid Function Tests: No results for input(s): TSH, T4TOTAL, FREET4, T3FREE, THYROIDAB in the last 72 hours. Anemia Panel: No results for input(s): VITAMINB12, FOLATE, FERRITIN, TIBC, IRON, RETICCTPCT in the last 72 hours. Urine analysis:    Component Value Date/Time   COLORURINE YELLOW 10/13/2015 1659   APPEARANCEUR CLOUDY (A) 10/13/2015 1659   LABSPEC 1.015 12/22/2016 1210   PHURINE 5.5 12/22/2016 1210   GLUCOSEU 100 (A) 12/22/2016 1210   HGBUR TRACE (A) 12/22/2016 1210   BILIRUBINUR NEGATIVE 12/22/2016 1210   KETONESUR NEGATIVE 12/22/2016 1210   PROTEINUR >=300 (A) 12/22/2016 1210   UROBILINOGEN 0.2 12/22/2016 1210   NITRITE  NEGATIVE 12/22/2016 1210   LEUKOCYTESUR TRACE (A) 12/22/2016 1210   Sepsis  Labs: !!!!!!!!!!!!!!!!!!!!!!!!!!!!!!!!!!!!!!!!!!!! @LABRCNTIP (procalcitonin:4,lacticidven:4) ) Recent Results (from the past 240 hour(s))  SARS Coronavirus 2 (CEPHEID- Performed in [facility] hospital lab), Hosp Order     Status: None   Collection Time: 02/20/19  2:54 PM   Specimen: Nasopharyngeal Swab  Result Value Ref Range Status   SARS Coronavirus 2 NEGATIVE NEGATIVE Final    Comment: (NOTE) If result is NEGATIVE SARS-CoV-2 target nucleic acids are NOT DETECTED. The SARS-CoV-2 RNA is generally detectable in upper and lower  respiratory specimens during the acute phase of infection. The lowest  concentration of SARS-CoV-2 viral copies this assay can detect is 250  copies / mL. A negative result does not preclude SARS-CoV-2 infection  and should not be used as the sole basis for treatment or other  patient management decisions.  A negative result may occur with  improper specimen collection / handling, submission of specimen other  than nasopharyngeal swab, presence of viral mutation(s) within the  areas targeted by this assay, and inadequate number of viral copies  (<250 copies / mL). A negative result must be combined with clinical  observations, patient history, and epidemiological information. If result is POSITIVE SARS-CoV-2 target nucleic acids are DETECTED. The SARS-CoV-2 RNA is generally detectable in upper and lower  respiratory specimens dur ing the acute phase of infection.  Positive  results are indicative of active infection with SARS-CoV-2.  Clinical  correlation with patient history and other diagnostic information is  necessary to determine patient infection status.  Positive results do  not rule out bacterial infection or co-infection with other viruses. If result is PRESUMPTIVE POSTIVE SARS-CoV-2 nucleic acids MAY BE PRESENT.   A presumptive positive result was obtained on the  submitted specimen  and confirmed on repeat testing.  While 2019 novel coronavirus  (SARS-CoV-2) nucleic acids may be present in the submitted sample  additional confirmatory testing may be necessary for epidemiological  and / or clinical management purposes  to differentiate between  SARS-CoV-2 and other Sarbecovirus currently known to infect humans.  If clinically indicated additional testing with an alternate test  methodology [phone]) is advised. The SARS-CoV-2 RNA is generally  detectable in upper and lower respiratory sp ecimens during the acute  phase of infection. The expected result is Negative. Fact Sheet for Patients:  StrictlyIdeas.no Fact Sheet for Healthcare Providers: BankingDealers.co.za This test is not yet approved or cleared by the [place] FDA and has been authorized for detection and/or diagnosis of SARS-CoV-2 by FDA under an Emergency Use Authorization (EUA).  This EUA will remain in effect (meaning this test can be used) for the duration of the COVID-19 declaration under Section 564(b)(1) of the Act, 21 U.S.C. section 360bbb-3(b)(1), unless the authorization is terminated or revoked sooner. Performed at [facility], [place] [address], [place], [place] [address]      Radiological Exams on Admission: Dg Chest Portable 1 View  Result Date: 02/20/2019 CLINICAL DATA:  Hypoglycemia with shortness of breath and lethargy EXAM: PORTABLE CHEST 1 VIEW COMPARISON:  Chest radiograph October 27, 2016 and chest CT January 31, 2018 FINDINGS: There is airspace opacity throughout the right base region. Lungs elsewhere are clear. There is cardiomegaly with mild pulmonary venous hypertension. No adenopathy. No bone lesions. IMPRESSION: Airspace opacity right base region consistent with pneumonia. Cardiomegaly with pulmonary vascular congestion present. No evident adenopathy. Electronically Signed   By: Lowella Grip III M.D.   On: 02/20/2019 15:45    EKG: Independently reviewed.  Normal sinus rhythm with left  axis deviation  Assessment/Plan Principal Problem:   Volume overload Active Problems:   Insulin dependent type 2 diabetes mellitus, uncontrolled (HCC)   Hyperlipemia   Hypertension   Chronic low back pain   Diabetic peripheral neuropathy associated with type 2 diabetes mellitus (HCC)   Chronic kidney disease, stage IV (severe) (HCC)   Chronic pain syndrome   Volume overload -Patient has had worsening edema and shortness of breath despite Lasix.  -BNP 457 -Nephrology consultation and transfer to [facility] for likely initiation of dialysis -Continue Lasix, Zaroxolyn in the meanwhile -Strict I's and O's  End-stage renal failure -Nephrology consultation   Acute hypoxemic respiratory failure -Secondary to above, continue nasal cannula O2 to maintain sat greater than 92% -Chest x-ray revealed possible right lower opacity, but patient has not had any productive cough, fever, no leukocytosis.  Continue to monitor off of antibiotics at this point, could elect to repeat chest x-ray once volume status has been addressed  Hyperlipidemia -Continue Lipitor  Essential hypertension -Continue Coreg, hydralazine, verapamil  Type 2 diabetes, insulin-dependent -Continue Lantus, sliding scale insulin  Chronic pain -Continue oxycodone, Lyrica   DVT prophylaxis: Subcutaneous heparin Code Status: Full code Family Communication: None Disposition Plan: Transfer to [place] called: Nephrology, EDP spoke with Dr. Joelyn Oms Admission status: Inpatient  Severity of Illness: The appropriate patient status for this patient is INPATIENT. Inpatient status is judged to be reasonable and necessary in order to provide the required intensity of service to ensure the patient's safety. The patient's presenting symptoms, physical exam findings, and initial radiographic and  laboratory data in the context of their chronic comorbidities is felt to place them at high risk for further clinical deterioration. Furthermore, it is not anticipated that the patient will be medically stable for discharge from the hospital within 2 midnights of admission.   [name], DO [facility] 02/20/2019, 5:57 PM    How to contact the [facility] Attending or Consulting provider [place] or covering provider during after hours [place], for this patient?  1. Check the care team in [facility] and look for a) attending/consulting [facility] provider listed and b) the [facility] team listed 2. Log into [email or web address] and use [place]'s universal password to access. If you do not have the password, please contact the hospital operator. 3. Locate the [facility] provider you are looking for under [facility] and page to a number that you can be directly reached. 4. If you still have difficulty reaching the provider, please page the [facility] (Director on Call) for the Hospitalists listed on amion for assistance.

## 2019-02-20 NOTE — ED Provider Notes (Signed)
[facility] DEPT Provider Note   CSN: [id number] Arrival date & time: 02/20/19  1356    History   Chief Complaint Chief Complaint  Patient presents with  . Shortness of Breath    HPI Jerry Cantrell is a 53 y.o. male.     Patient with history of end-stage renal disease, heart failure who presents the ED with progressive shortness of breath.  Patient feels as if he has a lot of fluid on him.  Has been taking increased doses of Lasix at home but still feels short of breath.  Has weight gain.  Swelling in his legs.  Unable to ambulate without shortness of breath.  Unable to lie flat.  Patient has a fistula on his left arm that is been there for 2 years but has never started dialysis.  He thinks that he may be at that point given his symptoms.  The history is provided by the patient.  Shortness of Breath Severity:  Moderate Onset quality:  Gradual Timing:  Constant Progression:  Worsening Chronicity:  Recurrent Relieved by: lasix. Worsened by:  Nothing Associated symptoms: no abdominal pain, no chest pain, no cough, no ear pain, no fever, no rash, no sore throat, no vomiting and no wheezing     Past Medical History:  Diagnosis Date  . Acute diastolic heart failure ([place])   . CHF (congestive heart failure) ([place])   . Diabetes mellitus with nephropathy ([place]) 05/12/2012   Dr. Justin Mend follows  . Hemorrhoids 05/12/2012  . Hyperlipidemia   . Hypertension   . Lumbar spinal stenosis 05/03/2012   Mild with only right L4 nerve root encroachment, no neurogenic claudication   . Lumbar spondylosis 05/03/2012  . Meralgia paraesthetica 05/03/2012  . Meralgia paraesthetica 05/03/2012   On Lyrica which does improve pain.   Marland Kitchen Neuropathy in diabetes ([place]) 05/12/2012  . Obesity   . Pneumonia   . Retinopathy   . Sleep apnea    uses cpap  . Substance abuse ([place])   . Urinary incontinence 05/12/2012   Since the start of Sept. 2013     Patient Active Problem List   Diagnosis  Date Noted  . Cellulitis of left upper extremity   . Inflammatory monoarthritis of wrist, left   . Inflammatory arthropathy 09/17/2018  . Hyperparathyroidism ([place]) 08/18/2018  . Primary insomnia 08/18/2018  . Chronic pain syndrome 08/18/2018  . CKD stage 5 due to type 2 diabetes mellitus ([place]) 08/18/2018  . COPD (chronic obstructive pulmonary disease) ([place]) 02/10/2017  . CKD (chronic kidney disease), stage V ([place]) 01/06/2016  . CKD (chronic kidney disease), stage III ([place])   . Type 2 diabetes, uncontrolled, with neuropathy ([place])   . Marijuana abuse   . Tobacco abuse   . Essential hypertension, malignant   . Chronic kidney disease, stage IV (severe) ([place])   . Legally blind 10/13/2015  . Cocaine use disorder, moderate, dependence ([place]) 07/02/2015  . Accelerated hypertension   . Uncontrolled type 2 diabetes mellitus with ketoacidosis without coma ([place])   . Cocaine abuse ([place])   . Hypertensive emergency 06/06/2015  . Chronic respiratory failure ([place])   . Pulmonary nodule 05/22/2015  . Morbid obesity with BMI of 40.0-44.9, adult ([place]) 03/26/2015  . Vision loss, bilateral 02/22/2015  . Nicotine dependence, cigarettes, with other nicotine-induced disorders 02/22/2015  . Thrombocytopenia ([place]) 12/04/2013  . Diastolic heart failure secondary to hypertension ([place]) 11/30/2013  . Radiculopathy with lower extremity symptoms 11/28/2013  . Depression 06/16/2013  . Chronic recurrent major  depressive disorder ([place]) 06/16/2013  . Proliferative diabetic retinopathy ([place]) 06/08/2013  . Diabetic macular edema of left eye, with cataract, associated with type 2 diabetes mellitus ([place]) 06/08/2013  . Meibomianitis 06/08/2013  . Erectile dysfunction associated with type 2 diabetes mellitus ([place]) 04/22/2013  . Decreased peripheral vision of left eye 04/12/2013  . Plantar fasciitis of right foot 04/12/2013  . Obstructive sleep apnea 02/02/2013  . Diabetic peripheral neuropathy associated with type 2  diabetes mellitus ([place]) 12/27/2012  . Abnormality of gait 12/06/2012  . Family history of malignant neoplasm of gastrointestinal tract 10/10/2012  . Personal history of colonic polyps 10/10/2012  . Skin tag 08/18/2012  . Chronic low back pain 07/15/2012  . Preventative health care 06/02/2012  . Hyperlipemia 05/12/2012  . Hypertension 05/12/2012  . Hemorrhoids 05/12/2012  . Blood per rectum 05/12/2012  . Meralgia paraesthetica 05/03/2012  . Lumbar spondylosis 05/03/2012  . Lumbar spinal stenosis 05/03/2012  . Insulin dependent type 2 diabetes mellitus, uncontrolled ([place]) 05/13/1999    Past Surgical History:  Procedure Laterality Date  . ABDOMINAL SURGERY     Abscess I&D 2/2 infected hair  . AV FISTULA PLACEMENT Left 02/06/2016   Procedure: LEFT ARM RADIOCEPHALIC ARTERIOVENOUS (AV) FISTULA CREATION;  Surgeon: Rosetta Posner, MD;  Location: [facility];  Service: Vascular;  Laterality: Left;  . COLONOSCOPY W/ POLYPECTOMY     pt to bring records  . COLONOSCOPY WITH PROPOFOL N/A 06/05/2016   Procedure: COLONOSCOPY WITH PROPOFOL;  Surgeon: Doran Stabler, MD;  Location: [facility];  Service: Gastroenterology;  Laterality: N/A;  . TONSILLECTOMY          Home Medications    Prior to Admission medications   Medication Sig Start Date End Date Taking? Authorizing Provider  Accu-Chek Softclix Lancets lancets Use as instructed 11/17/18   Forrest Moron, MD  atorvastatin (LIPITOR) 80 MG tablet Take 1 tablet (80 mg total) by mouth daily at 6 PM. 11/17/18   Forrest Moron, MD  calcitRIOL (ROCALTROL) 0.25 MCG capsule Take 0.25 mcg by mouth daily. 12/30/18   [provider]  carvedilol (COREG) 25 MG tablet Take 1 tablet (25 mg total) by mouth 2 (two) times daily. 11/17/18   Forrest Moron, MD  cetirizine (ZYRTEC) 10 MG tablet TAKE ONE TABLET BY MOUTH ONCE DAILY FOR ALLERGIES 12/09/18   Forrest Moron, MD  cinacalcet (SENSIPAR) 60 MG tablet Take 60 mg by mouth daily. 01/05/19   [provider]  clobetasol cream (TEMOVATE) 0.05 % APPLY TO AFFECED AREAS(S) TOPICALLY TWICE DAILY 11/17/18   Delia Chimes A, MD  ferrous sulfate 325 (65 FE) MG tablet Take 1 tablet (325 mg total) by mouth daily. 08/18/18   Forrest Moron, MD  fluticasone (FLONASE) 50 MCG/ACT nasal spray USE TWO SPRAYS INTO EACH NOSTRILS DAILY 11/17/18   Delia Chimes A, MD  furosemide (LASIX) 80 MG tablet 2 Tablets in the AM and 2 Tablets at lunchtime    [provider]  GAVILAX powder TAKE ONE CAPFUL (17 GRAMS) BY MOUTH 2 (TWO) TIMES DAILY AS NEEDED. 11/28/18   Wendie Agreste, MD  glucose blood (ACCU-CHEK AVIVA PLUS) test strip 1 each by Other route 5 (five) times daily. Use as instructed 11/17/18   Delia Chimes A, MD  glycerin adult 2 g suppository Place 1 suppository rectally as needed for constipation. Patient not taking: Reported on 01/24/2019 10/18/18   Wendie Agreste, MD  hydrALAZINE (APRESOLINE) 100 MG tablet Take 1 tablet (100 mg  total) by mouth 3 (three) times daily. 11/17/18   Forrest Moron, MD  Insulin Glargine (LANTUS SOLOSTAR) 100 UNIT/ML Solostar Pen Inject 45 Units into the skin daily. INJECT 30 UNITS UNDER THE SKIN EVERY MORNING AND INJECT 15 UNITS EVERY NIGHT Patient taking differently: Inject 45 Units into the skin daily. INJECT 45 UNITS UNDER THE SKIN EVERY MORNING 10/20/18   Shamleffer, Melanie Crazier, MD  insulin lispro (HUMALOG KWIKPEN) 100 UNIT/ML KwikPen Inject 0.08 mLs (8 Units total) into the skin 3 (three) times daily. Max daily dose of 40 units daily Patient taking differently: Inject 10-20 Units into the skin 3 (three) times daily. Max daily dose of 40 units daily 10/20/18   Shamleffer, Melanie Crazier, MD  Insulin Pen Needle (B-D UF III MINI PEN NEEDLES) 31G X 5 MM MISC Four times daily 11/17/18   Forrest Moron, MD  metolazone (ZAROXOLYN) 5 MG tablet Take 1 tablet (5 mg total) by mouth daily. 06/13/17   Forrest Moron, MD  multivitamin-iron-minerals-folic acid  (CENTRUM) chewable tablet Chew 1 tablet by mouth daily.    [provider]  Nicotine 21-14-7 MG/24HR KIT Place 1 patch onto the skin daily. 01/24/19   Parrett, Fonnie Mu, NP  Olopatadine HCl 0.2 % SOLN Apply 1 drop to eye daily. 12/09/18   Forrest Moron, MD  oxycodone-acetaminophen (LYNOX) 10-300 MG tablet Take 1 tablet by mouth every 4 (four) hours as needed for pain.    [provider]  phenylephrine (,USE FOR PREPARATION-H,) 0.25 % suppository Place 1 suppository rectally 2 (two) times daily as needed for hemorrhoids. Patient not taking: Reported on 01/24/2019 12/21/18   Doran Stabler, MD  Potassium Chloride ER 20 MEQ TBCR Take 20 mEq by mouth 2 (two) times daily. 10/08/17   Forrest Moron, MD  pregabalin (LYRICA) 75 MG capsule Take 75 mg by mouth 3 (three) times daily.    [provider]  verapamil (VERELAN PM) 180 MG 24 hr capsule Take 1 capsule (180 mg total) by mouth at bedtime. Patient taking differently: Take 360 mg by mouth at bedtime.  08/18/18   Forrest Moron, MD    Family History Family History  Problem Relation Age of Onset  . Prostate cancer Father   . Alcohol abuse Father   . Emphysema Father        smoked  . Diabetes Sister   . Colon cancer Neg Hx   . Colon polyps Neg Hx   . Stomach cancer Neg Hx   . Rectal cancer Neg Hx     Social History Social History   Tobacco Use  . Smoking status: Current Every Day Smoker    Packs/day: 1.00    Years: 30.00    Pack years: 30.00    Types: Cigarettes  . Smokeless tobacco: Never Used  . Tobacco comment: "Trying - hard to stop"  Substance Use Topics  . Alcohol use: No    Comment: " about 40 ounce beer per month."  down to a beer every 3 weeks  . Drug use: No    Types: Marijuana, Cocaine    Comment: marijuana "laced with something"; today, stated "no" to question of illegal drug use 06/16/2016     Allergies   Patient has no known allergies.   Review of Systems Review of Systems   Constitutional: Negative for chills and fever.  HENT: Negative for ear pain and sore throat.   Eyes: Negative for pain and visual disturbance.  Respiratory: Positive for  shortness of breath. Negative for cough and wheezing.   Cardiovascular: Positive for leg swelling. Negative for chest pain and palpitations.  Gastrointestinal: Negative for abdominal pain and vomiting.  Genitourinary: Negative for dysuria and hematuria.  Musculoskeletal: Negative for arthralgias and back pain.  Skin: Negative for color change and rash.  Neurological: Negative for seizures and syncope.  All other systems reviewed and are negative.    Physical Exam Updated Vital Signs BP (!) 179/95   Pulse 66   Temp 98.6 F (37 C) (Oral)   Resp 18   Ht 6' (1.829 m)   Wt 124.7 kg   SpO2 95%   BMI 37.30 kg/m   Physical Exam Vitals signs and nursing note reviewed.  Constitutional:      Appearance: He is well-developed.  HENT:     Head: Normocephalic and atraumatic.  Eyes:     Extraocular Movements: Extraocular movements intact.     Conjunctiva/sclera: Conjunctivae normal.     Pupils: Pupils are equal, round, and reactive to light.  Neck:     Musculoskeletal: Neck supple.  Cardiovascular:     Rate and Rhythm: Normal rate and regular rhythm.     Heart sounds: No murmur.  Pulmonary:     Effort: Tachypnea present. No respiratory distress.     Breath sounds: Decreased breath sounds and rales present. No wheezing.  Abdominal:     Palpations: Abdomen is soft.     Tenderness: There is no abdominal tenderness.  Musculoskeletal:     Right lower leg: Edema (2+) present.     Left lower leg: Edema (2+) present.  Skin:    General: Skin is warm and dry.     Capillary Refill: Capillary refill takes less than 2 seconds.  Neurological:     General: No focal deficit present.     Mental Status: He is alert.      ED Treatments / Results  Labs (all labs ordered are listed, but only abnormal results are displayed)  Labs Reviewed  CBC WITH DIFFERENTIAL/PLATELET - Abnormal; Notable for the following components:      Result Value   RBC 3.40 (*)    Hemoglobin 10.1 (*)    HCT 33.3 (*)    Platelets 109 (*)    All other components within normal limits  COMPREHENSIVE METABOLIC PANEL - Abnormal; Notable for the following components:   Glucose, Bld 58 (*)    BUN 76 (*)    Creatinine, Ser 8.88 (*)    Calcium 8.4 (*)    Albumin 3.3 (*)    AST 42 (*)    GFR calc non Af Amer 6 (*)    GFR calc Af Amer 7 (*)    All other components within normal limits  BRAIN NATRIURETIC PEPTIDE - Abnormal; Notable for the following components:   B Natriuretic Peptide 457.1 (*)    All other components within normal limits  TROPONIN I - Abnormal; Notable for the following components:   Troponin I 0.03 (*)    All other components within normal limits  CBG MONITORING, ED - Abnormal; Notable for the following components:   Glucose-Capillary 35 (*)    All other components within normal limits  CBG MONITORING, ED - Abnormal; Notable for the following components:   Glucose-Capillary 113 (*)    All other components within normal limits  SARS CORONAVIRUS 2 (HOSPITAL ORDER, [facility])  LACTIC ACID, PLASMA    EKG EKG Interpretation  Date/Time:  Monday February 20 2019 15:25:10 EDT Ventricular Rate:  64 PR Interval:    QRS Duration: 116 QT Interval:  467 QTC Calculation: 482 R Axis:   -43 Text Interpretation:  Sinus rhythm Prolonged PR interval Nonspecific IVCD with LAD Anterior infarct, old Confirmed by Lennice Sites [phone]) on 02/20/2019 3:32:41 PM   Radiology Dg Chest Portable 1 View  Result Date: 02/20/2019 CLINICAL DATA:  Hypoglycemia with shortness of breath and lethargy EXAM: PORTABLE CHEST 1 VIEW COMPARISON:  Chest radiograph October 27, 2016 and chest CT January 31, 2018 FINDINGS: There is airspace opacity throughout the right base region. Lungs elsewhere are clear. There is cardiomegaly  with mild pulmonary venous hypertension. No adenopathy. No bone lesions. IMPRESSION: Airspace opacity right base region consistent with pneumonia. Cardiomegaly with pulmonary vascular congestion present. No evident adenopathy. Electronically Signed   By: Lowella Grip III M.D.   On: 02/20/2019 15:45    Procedures .Critical Care Performed by: Lennice Sites, DO Authorized by: Lennice Sites, DO   Critical care provider statement:    Critical care time (minutes):  35   Critical care was necessary to treat or prevent imminent or life-threatening deterioration of the following conditions:  Respiratory failure and cardiac failure   Critical care was time spent personally by me on the following activities:  Blood draw for specimens, development of treatment plan with patient or surrogate, discussions with primary provider, evaluation of patient's response to treatment, examination of patient, ordering and performing treatments and interventions, ordering and review of laboratory studies, ordering and review of radiographic studies, pulse oximetry, re-evaluation of patient's condition, review of old charts, obtaining history from patient or surrogate and discussions with consultants   I assumed direction of critical care for this patient from another provider in my specialty: no     (including critical care time)  Medications Ordered in ED Medications  dextrose 50 % solution 50 mL (50 mLs Intravenous Given 02/20/19 1542)     Initial Impression / Assessment and Plan / ED Course  I have reviewed the triage vital signs and the nursing notes.  Pertinent labs & imaging results that were available during my care of the patient were reviewed by me and considered in my medical decision making (see chart for details).     Jerry Cantrell is a 54 year old male with history of diabetes, end-stage renal disease, heart failure who presents to the ED with shortness of breath.  Patient hypertensive, on 2 L of  oxygen.  Patient does not wear oxygen at home.  When oxygen is turned off patient with saturations between 90-92%.  Patient denies any cough, fever.  Patient has fistula for dialysis in his left forearm but he has not started dialysis.  He states that he is had progressive shortness of breath.  Has been taking double his dose of Lasix at home with not much relief.  States that he is making urine.  Denies any cough, sputum production.  Upon arrival patient was CBG in the 30s.  However, normal mentation.  Given dextrose.  Will get lab work including troponin, BNP.  Patient appears volume overloaded on exam with pitting edema bilaterally, rales.  EKG overall unremarkable.  No new ischemic changes.  Patient not having any chest pain.  Suspect may need admission for volume overload.  May need to start dialysis.  Patient with elevated BNP, troponin.  Chest x-ray shows signs of volume overload.  Possible pneumonia on chest x-ray however patient without leukocytosis, normal lactic acid, no fever.  Does not  have any specific cough or sputum production.  Patient with elevated BUN and creatinine.  However potassium within normal limits.  Blood sugar was 35 upon arrival but however patient had taken insulin but not eaten.  Was given D50 and blood sugar stabilized.  Talked with nephrology on the phone as patient now on 2 L of oxygen, respiratory failure likely from volume overload.  Patient has been using Lasix at home with increased doses with minimal relief.  Likely would benefit from dialysis at this time.  Patient admitted to the hospitalist service and to be transferred to [facility] so he can be admitted and seen by nephrology.    This chart was dictated using voice recognition software.  Despite best efforts to proofread,  errors can occur which can change the documentation meaning.    Final Clinical Impressions(s) / ED Diagnoses   Final diagnoses:  Acute respiratory failure with hypoxia (HCC)  Elevated  troponin  Hypervolemia, unspecified hypervolemia type  Hypoglycemia    ED Discharge Orders    None       Lennice Sites, DO 02/20/19 1734

## 2019-02-20 NOTE — ED Notes (Signed)
Bed: BP79 Expected date:  Expected time:  Means of arrival:  Comments: [facility] in lungs

## 2019-02-20 NOTE — ED Notes (Signed)
Date and time results received: 02/20/19 4:41 PM  (use smartphrase ".now" to insert current time)  Test: troponin Critical Value: 0.03  Name of Provider Notified: Chelsea RN   Orders Received? Or Actions Taken?:

## 2019-02-20 NOTE — ED Notes (Addendum)
[facility] contacted. Paperwork at bedside

## 2019-02-20 NOTE — ED Notes (Signed)
Patient provided with orange juice after blood sugar reading obtained. ED MD aware of blood sugar reading. Patient also given D50 (MAR). No change in patient mental status.

## 2019-02-20 NOTE — ED Notes (Signed)
ED TO INPATIENT HANDOFF REPORT  Name/Age/Gender Jerry Cantrell 53 y.o. male  Code Status Code Status History    Date Active Date Inactive Code Status Order ID Comments User Context   09/17/2018 1642 09/20/2018 1606 Full Code [id number]  Ina Homes, MD ED   10/13/2015 1547 10/22/2015 1739 Full Code [id number]  Rondel Jumbo, PA-C ED   06/06/2015 1613 06/14/2015 2119 Full Code [id number]  Javier Glazier, MD ED   05/13/2015 2052 05/18/2015 1700 Full Code [id number]  Patrecia Pour, MD Inpatient   01/18/2015 0401 01/22/2015 1639 Full Code [id number]  Ivor Costa, MD ED   11/28/2013 2236 12/04/2013 1558 Full Code [id number]  Otho Bellows, MD Inpatient   10/25/2013 2000 10/26/2013 1438 Full Code [id number]  Harlow Mares, PA-C ED   01/24/2013 1826 01/30/2013 1931 Full Code [id number]  Othella Boyer, MD Inpatient   Advance Care Planning Activity      Home/SNF/Other Home  Chief Complaint ShOB  Level of Care/Admitting Diagnosis ED Disposition    ED Disposition Condition [facility] Area: [place] [100100]  Level of Care: Telemetry Medical [104]  Covid Evaluation: Confirmed COVID Negative  Diagnosis: Volume overload [875643]  Admitting Physician: [name] [[id number]]  Attending Physician: [name] [phone]  Estimated length of stay: 3 - 4 days  Certification:: I certify this patient will need inpatient services for at least 2 midnights  PT Class (Do Not Modify): Inpatient [101]  PT Acc Code (Do Not Modify): Private [1]       Medical History Past Medical History:  Diagnosis Date  . Acute diastolic heart failure ([place])   . CHF (congestive heart failure) ([place])   . Diabetes mellitus with nephropathy ([place]) 05/12/2012   Dr. Justin Mend follows  . Hemorrhoids 05/12/2012  . Hyperlipidemia   . Hypertension   . Lumbar spinal stenosis 05/03/2012   Mild with only right L4 nerve root encroachment, no neurogenic claudication   . Lumbar spondylosis  05/03/2012  . Meralgia paraesthetica 05/03/2012  . Meralgia paraesthetica 05/03/2012   On Lyrica which does improve pain.   Marland Kitchen Neuropathy in diabetes ([place]) 05/12/2012  . Obesity   . Pneumonia   . Retinopathy   . Sleep apnea    uses cpap  . Substance abuse ([place])   . Urinary incontinence 05/12/2012   Since the start of Sept. 2013     Allergies No Known Allergies  IV Location/Drains/Wounds Patient Lines/Drains/Airways Status   Active Line/Drains/Airways    Name:   Placement date:   Placement time:   Site:   Days:   Peripheral IV 02/20/19 Right Antecubital   02/20/19    1418    Antecubital   less than 1   Fistula / Graft Left Forearm Arteriovenous fistula   02/06/16    0929    Forearm   1110   Incision (Closed) 02/06/16 Arm Left   02/06/16    0938     1110          Labs/Imaging Results for orders placed or performed during the hospital encounter of 02/20/19 (from the past 48 hour(s))  CBC with Differential     Status: Abnormal   Collection Time: 02/20/19  2:53 PM  Result Value Ref Range   WBC 5.7 4.0 - 10.5 K/uL   RBC 3.40 (L) 4.22 - 5.81 MIL/uL   Hemoglobin 10.1 (L) 13.0 - 17.0 g/dL   HCT 33.3 (L) 39.0 - 52.0 %   MCV 97.9  80.0 - 100.0 fL   MCH 29.7 26.0 - 34.0 pg   MCHC 30.3 30.0 - 36.0 g/dL   RDW 13.3 11.5 - 15.5 %   Platelets 109 (L) 150 - 400 K/uL    Comment: Immature Platelet Fraction may be clinically indicated, consider ordering this additional test [id number]    nRBC 0.0 0.0 - 0.2 %   Neutrophils Relative % 68 %   Neutro Abs 4.0 1.7 - 7.7 K/uL   Lymphocytes Relative 19 %   Lymphs Abs 1.1 0.7 - 4.0 K/uL   Monocytes Relative 10 %   Monocytes Absolute 0.6 0.1 - 1.0 K/uL   Eosinophils Relative 2 %   Eosinophils Absolute 0.1 0.0 - 0.5 K/uL   Basophils Relative 0 %   Basophils Absolute 0.0 0.0 - 0.1 K/uL   RBC Morphology MORPHOLOGY UNREMARKABLE    Immature Granulocytes 1 %   Abs Immature Granulocytes 0.03 0.00 - 0.07 K/uL    Comment: Performed at [facility], [place] [address], [place], [place] [address]  Comprehensive metabolic panel     Status: Abnormal   Collection Time: 02/20/19  2:53 PM  Result Value Ref Range   Sodium 144 135 - 145 mmol/L   Potassium 4.7 3.5 - 5.1 mmol/L   Chloride 107 98 - 111 mmol/L   CO2 25 22 - 32 mmol/L   Glucose, Bld 58 (L) 70 - 99 mg/dL   BUN 76 (H) 6 - 20 mg/dL   Creatinine, Ser 8.88 (H) 0.61 - 1.24 mg/dL   Calcium 8.4 (L) 8.9 - 10.3 mg/dL   Total Protein 6.7 6.5 - 8.1 g/dL   Albumin 3.3 (L) 3.5 - 5.0 g/dL   AST 42 (H) 15 - 41 U/L   ALT 41 0 - 44 U/L   Alkaline Phosphatase 91 38 - 126 U/L   Total Bilirubin 0.4 0.3 - 1.2 mg/dL   GFR calc non Af Amer 6 (L) >60 mL/min   GFR calc Af Amer 7 (L) >60 mL/min   Anion gap 12 5 - 15    Comment: Performed at [facility], [place] [address], [place], [place] [address]  Troponin I - ONCE - STAT     Status: Abnormal   Collection Time: 02/20/19  2:53 PM  Result Value Ref Range   Troponin I 0.03 (HH) <0.03 ng/mL    Comment: CRITICAL RESULT CALLED TO, READ BACK BY AND VERIFIED WITH: HALL,C. RN @1641  ON 06.22.2020 BY COHEN,K Performed at [facility], [place] [address], [place], [place] [address]   SARS Coronavirus 2 (CEPHEID- Performed in [place] hospital lab), Hosp Order     Status: None   Collection Time: 02/20/19  2:54 PM   Specimen: Nasopharyngeal Swab  Result Value Ref Range   SARS Coronavirus 2 NEGATIVE NEGATIVE    Comment: (NOTE) If result is NEGATIVE SARS-CoV-2 target nucleic acids are NOT DETECTED. The SARS-CoV-2 RNA is generally detectable in upper and lower  respiratory specimens during the acute phase of infection. The lowest  concentration of SARS-CoV-2 viral copies this assay can detect is 250  copies / mL. A negative result does not preclude SARS-CoV-2 infection  and should not be used as the sole basis for treatment or other  patient management decisions.  A negative result may occur with   improper specimen collection / handling, submission of specimen other  than nasopharyngeal swab, presence of viral mutation(s) within the  areas targeted by this assay, and inadequate  number of viral copies  (<250 copies / mL). A negative result must be combined with clinical  observations, patient history, and epidemiological information. If result is POSITIVE SARS-CoV-2 target nucleic acids are DETECTED. The SARS-CoV-2 RNA is generally detectable in upper and lower  respiratory specimens dur ing the acute phase of infection.  Positive  results are indicative of active infection with SARS-CoV-2.  Clinical  correlation with patient history and other diagnostic information is  necessary to determine patient infection status.  Positive results do  not rule out bacterial infection or co-infection with other viruses. If result is PRESUMPTIVE POSTIVE SARS-CoV-2 nucleic acids MAY BE PRESENT.   A presumptive positive result was obtained on the submitted specimen  and confirmed on repeat testing.  While 2019 novel coronavirus  (SARS-CoV-2) nucleic acids may be present in the submitted sample  additional confirmatory testing may be necessary for epidemiological  and / or clinical management purposes  to differentiate between  SARS-CoV-2 and other Sarbecovirus currently known to infect humans.  If clinically indicated additional testing with an alternate test  methodology [phone]) is advised. The SARS-CoV-2 RNA is generally  detectable in upper and lower respiratory sp ecimens during the acute  phase of infection. The expected result is Negative. Fact Sheet for Patients:  StrictlyIdeas.no Fact Sheet for Healthcare Providers: BankingDealers.co.za This test is not yet approved or cleared by the [place] FDA and has been authorized for detection and/or diagnosis of SARS-CoV-2 by FDA under an Emergency Use Authorization (EUA).  This EUA will  remain in effect (meaning this test can be used) for the duration of the COVID-19 declaration under Section 564(b)(1) of the Act, 21 U.S.C. section 360bbb-3(b)(1), unless the authorization is terminated or revoked sooner. Performed at [facility], [place] [address] [place], [place] [address]   Brain natriuretic peptide     Status: Abnormal   Collection Time: 02/20/19  2:54 PM  Result Value Ref Range   B Natriuretic Peptide 457.1 (H) 0.0 - 100.0 pg/mL    Comment: Performed at [facility], [place] [address] [place], [place] [address]  CBG monitoring, ED     Status: Abnormal   Collection Time: 02/20/19  3:36 PM  Result Value Ref Range   Glucose-Capillary 35 (LL) 70 - 99 mg/dL   Comment 1 Notify RN   Lactic acid, plasma     Status: None   Collection Time: 02/20/19  4:00 PM  Result Value Ref Range   Lactic Acid, Venous 1.0 0.5 - 1.9 mmol/L    Comment: Performed at [facility], [place] [address] [place], [place] [address]  CBG monitoring, ED     Status: Abnormal   Collection Time: 02/20/19  4:36 PM  Result Value Ref Range   Glucose-Capillary 113 (H) 70 - 99 mg/dL   Dg Chest Portable 1 View  Result Date: 02/20/2019 CLINICAL DATA:  Hypoglycemia with shortness of breath and lethargy EXAM: PORTABLE CHEST 1 VIEW COMPARISON:  Chest radiograph October 27, 2016 and chest CT January 31, 2018 FINDINGS: There is airspace opacity throughout the right base region. Lungs elsewhere are clear. There is cardiomegaly with mild pulmonary venous hypertension. No adenopathy. No bone lesions. IMPRESSION: Airspace opacity right base region consistent with pneumonia. Cardiomegaly with pulmonary vascular congestion present. No evident adenopathy. Electronically Signed   By: Lowella Grip III M.D.   On: 02/20/2019 15:45    Pending Labs [employer] (From admission, onward)    Start  Ordered   Signed and Held  CBC  (heparin)  Once,   R    Comments:  Baseline for heparin therapy IF NOT ALREADY DRAWN.  Notify MD if PLT < 100 K.    Signed and Held   Signed and Held  Creatinine, serum  (heparin)  Once,   R    Comments: Baseline for heparin therapy IF NOT ALREADY DRAWN.    Signed and Held   Signed and Held  CBC  Tomorrow morning,   R     Signed and Held   Signed and Held  Basic metabolic panel  Tomorrow morning,   R     Signed and Held   Signed and Held  Troponin I - Now Then Q6H  Now then every 6 hours,   R     Signed and Held          Vitals/Pain Today's Vitals   02/20/19 1419 02/20/19 1504 02/20/19 1530 02/20/19 1815  BP:  (!) 172/98 (!) 179/95 (!) 182/85  Pulse:  63 66 61  Resp:  16 18 16   Temp:      TempSrc:      SpO2:  98% 95% 96%  Weight: 124.7 kg     Height: 6' (1.829 m)     PainSc: 8        Isolation Precautions No active isolations  Medications Medications  dextrose 50 % solution 50 mL (50 mLs Intravenous Given 02/20/19 1542)    Mobility walks

## 2019-02-20 NOTE — ED Notes (Signed)
[facility] at bedside 

## 2019-02-20 NOTE — ED Triage Notes (Signed)
Per EMS pt positive for rales related to fluid overload with hx of CHF and need of dialysis.

## 2019-02-21 DIAGNOSIS — G894 Chronic pain syndrome: Secondary | ICD-10-CM

## 2019-02-21 DIAGNOSIS — N184 Chronic kidney disease, stage 4 (severe): Secondary | ICD-10-CM

## 2019-02-21 DIAGNOSIS — I10 Essential (primary) hypertension: Secondary | ICD-10-CM

## 2019-02-21 DIAGNOSIS — Z794 Long term (current) use of insulin: Secondary | ICD-10-CM

## 2019-02-21 DIAGNOSIS — E1142 Type 2 diabetes mellitus with diabetic polyneuropathy: Secondary | ICD-10-CM

## 2019-02-21 DIAGNOSIS — E1165 Type 2 diabetes mellitus with hyperglycemia: Secondary | ICD-10-CM

## 2019-02-21 LAB — CBC
HCT: 32.7 % — ABNORMAL LOW (ref 39.0–52.0)
Hemoglobin: 10 g/dL — ABNORMAL LOW (ref 13.0–17.0)
MCH: 29.3 pg (ref 26.0–34.0)
MCHC: 30.6 g/dL (ref 30.0–36.0)
MCV: 95.9 fL (ref 80.0–100.0)
Platelets: 109 10*3/uL — ABNORMAL LOW (ref 150–400)
RBC: 3.41 MIL/uL — ABNORMAL LOW (ref 4.22–5.81)
RDW: 13.3 % (ref 11.5–15.5)
WBC: 4 10*3/uL (ref 4.0–10.5)
nRBC: 0 % (ref 0.0–0.2)

## 2019-02-21 LAB — BASIC METABOLIC PANEL
Anion gap: 12 (ref 5–15)
BUN: 73 mg/dL — ABNORMAL HIGH (ref 6–20)
CO2: 24 mmol/L (ref 22–32)
Calcium: 8.7 mg/dL — ABNORMAL LOW (ref 8.9–10.3)
Chloride: 107 mmol/L (ref 98–111)
Creatinine, Ser: 9.22 mg/dL — ABNORMAL HIGH (ref 0.61–1.24)
GFR calc Af Amer: 7 mL/min — ABNORMAL LOW (ref >60)
GFR calc non Af Amer: 6 mL/min — ABNORMAL LOW (ref >60)
Glucose, Bld: 48 mg/dL — ABNORMAL LOW (ref 70–99)
Potassium: 4 mmol/L (ref 3.5–5.1)
Sodium: 143 mmol/L (ref 135–145)

## 2019-02-21 LAB — CREATININE, SERUM
Creatinine, Ser: 9.24 mg/dL — ABNORMAL HIGH (ref 0.61–1.24)
GFR calc Af Amer: 7 mL/min — ABNORMAL LOW (ref >60)
GFR calc non Af Amer: 6 mL/min — ABNORMAL LOW (ref >60)

## 2019-02-21 LAB — GLUCOSE, CAPILLARY
Glucose-Capillary: 48 mg/dL — ABNORMAL LOW (ref 70–99)
Glucose-Capillary: 78 mg/dL (ref 70–99)
Glucose-Capillary: 237 mg/dL — ABNORMAL HIGH (ref 70–99)
Glucose-Capillary: 347 mg/dL — ABNORMAL HIGH (ref 70–99)
Glucose-Capillary: 229 mg/dL — ABNORMAL HIGH (ref 70–99)

## 2019-02-21 LAB — TROPONIN I
Troponin I: <0.03 ng/mL (ref <0.03)
Troponin I: <0.03 ng/mL (ref <0.03)

## 2019-02-21 MED ORDER — OXYCODONE-ACETAMINOPHEN 5-325 MG PO TABS: 1.0000 | ORAL_TABLET | ORAL | Status: DC | PRN

## 2019-02-21 MED ORDER — ALTEPLASE 2 MG IJ SOLR: 2.0000 mg | Freq: Once | INTRAMUSCULAR | Status: DC | PRN

## 2019-02-21 MED ORDER — OXYCODONE-ACETAMINOPHEN 10-300 MG PO TABS: 1.0000 | ORAL_TABLET | ORAL | Status: DC | PRN

## 2019-02-21 MED ORDER — CINACALCET HCL 30 MG PO TABS
60.0000 mg | ORAL_TABLET | Freq: Every day | ORAL | Status: DC
  Administered 2019-02-21 – 2019-02-24 (×4): 60 mg via ORAL
  Filled 2019-02-21 (×4): qty 2

## 2019-02-21 MED ORDER — NICOTINE 21-14-7 MG/24HR TD KIT: 1.0000 | PACK | Freq: Every day | TRANSDERMAL | Status: DC

## 2019-02-21 MED ORDER — INSULIN ASPART 100 UNIT/ML ~~LOC~~ SOLN
0.0000 [IU] | Freq: Three times a day (TID) | SUBCUTANEOUS | Status: DC
  Administered 2019-02-21: 3 [IU] via SUBCUTANEOUS
  Administered 2019-02-21 – 2019-02-22 (×2): 7 [IU] via SUBCUTANEOUS
  Administered 2019-02-22 – 2019-02-23 (×2): 1 [IU] via SUBCUTANEOUS
  Administered 2019-02-23: 2 [IU] via SUBCUTANEOUS
  Administered 2019-02-24: 5 [IU] via SUBCUTANEOUS
  Administered 2019-02-24: 3 [IU] via SUBCUTANEOUS

## 2019-02-21 MED ORDER — INSULIN GLARGINE 100 UNIT/ML ~~LOC~~ SOLN
12.0000 [IU] | Freq: Every day | SUBCUTANEOUS | Status: DC
  Administered 2019-02-21 – 2019-02-24 (×4): 12 [IU] via SUBCUTANEOUS
  Filled 2019-02-21 (×4): qty 0.12

## 2019-02-21 MED ORDER — CALCITRIOL 0.25 MCG PO CAPS
0.2500 ug | ORAL_CAPSULE | Freq: Every day | ORAL | Status: DC
  Administered 2019-02-21 – 2019-02-24 (×4): 0.25 ug via ORAL
  Filled 2019-02-21 (×4): qty 1

## 2019-02-21 MED ORDER — PENTAFLUOROPROP-TETRAFLUOROETH EX AERO: 1.0000 "application " | INHALATION_SPRAY | CUTANEOUS | Status: DC | PRN

## 2019-02-21 MED ORDER — HEPARIN SODIUM (PORCINE) 1000 UNIT/ML DIALYSIS: 1000.0000 [IU] | INTRAMUSCULAR | Status: DC | PRN

## 2019-02-21 MED ORDER — LIDOCAINE HCL (PF) 1 % IJ SOLN: 5.0000 mL | INTRAMUSCULAR | Status: DC | PRN

## 2019-02-21 MED ORDER — SODIUM CHLORIDE 0.9 % IV SOLN: 100.0000 mL | INTRAVENOUS | Status: DC | PRN

## 2019-02-21 MED ORDER — CHLORHEXIDINE GLUCONATE CLOTH 2 % EX PADS: 6.0000 | MEDICATED_PAD | Freq: Every day | CUTANEOUS | Status: DC

## 2019-02-21 MED ORDER — OXYCODONE HCL 5 MG PO TABS
5.0000 mg | ORAL_TABLET | ORAL | Status: DC | PRN
  Administered 2019-02-21: 5 mg via ORAL
  Filled 2019-02-21 (×2): qty 1

## 2019-02-21 MED ORDER — PREGABALIN 25 MG PO CAPS
25.0000 mg | ORAL_CAPSULE | Freq: Two times a day (BID) | ORAL | Status: DC
  Administered 2019-02-21 – 2019-02-24 (×6): 25 mg via ORAL
  Filled 2019-02-21 (×6): qty 1

## 2019-02-21 MED ORDER — LIDOCAINE-PRILOCAINE 2.5-2.5 % EX CREA: 1.0000 "application " | TOPICAL_CREAM | CUTANEOUS | Status: DC | PRN

## 2019-02-21 MED ORDER — NICOTINE 21 MG/24HR TD PT24
21.0000 mg | MEDICATED_PATCH | Freq: Every day | TRANSDERMAL | Status: DC
  Administered 2019-02-21 – 2019-02-24 (×4): 21 mg via TRANSDERMAL
  Filled 2019-02-21 (×4): qty 1

## 2019-02-21 NOTE — Consult Note (Signed)
[facility] Renal Consultation Note  Requesting MD: Dan[place] Indication for Consultation:  CKD progressed to ESRD   Chief complaint: shortness of breath   HPI:  Jerry Cantrell is a 53 y.o. male with a history including CKD stage V followed by Dr. Justin Mend and HTN, chronic diastolic CHF who presented with shortness of breath.  He had been out of town with family and was noticing shortness of breath with even short distances.  He states that he has been compliant with his metolazone 3 times a week and Lasix 160 mg twice a day but still feels overloaded.  He was last seen in the office on late May and had wanted to put off dialysis at that time in order to visit with family out of state.  Labs from 01/25/2019 demonstrate creatinine 9.79, BUN 112, PTH 443, and hemoglobin 11.3.  Today he states that he is willing to go ahead and start dialysis.  He would like to be set up at the outpatient unit that is near the social services office if possible, near [facility].  He had initially wanted to do home hemo and reports that he actually had had a home visit for this but he thinks it is just too complicated right now and would prefer in-center.  Note that he has a left radiocephalic AV fistula placed by Dr. Donnetta Hutching 01/2016.   PMHx:   Past Medical History:  Diagnosis Date  . Acute diastolic heart failure ([place])   . CHF (congestive heart failure) ([place])   . Diabetes mellitus with nephropathy ([place]) 05/12/2012   Dr. Justin Mend follows  . Hemorrhoids 05/12/2012  . Hyperlipidemia   . Hypertension   . Lumbar spinal stenosis 05/03/2012   Mild with only right L4 nerve root encroachment, no neurogenic claudication   . Lumbar spondylosis 05/03/2012  . Meralgia paraesthetica 05/03/2012  . Meralgia paraesthetica 05/03/2012   On Lyrica which does improve pain.   Marland Kitchen Neuropathy in diabetes ([place]) 05/12/2012  . Obesity   . Pneumonia   . Retinopathy   . Sleep apnea    uses cpap  . Substance abuse ([place])   . Urinary  incontinence 05/12/2012   Since the start of Sept. 2013     Past Surgical History:  Procedure Laterality Date  . ABDOMINAL SURGERY     Abscess I&D 2/2 infected hair  . AV FISTULA PLACEMENT Left 02/06/2016   Procedure: LEFT ARM RADIOCEPHALIC ARTERIOVENOUS (AV) FISTULA CREATION;  Surgeon: Rosetta Posner, MD;  Location: [place];  Service: Vascular;  Laterality: Left;  . COLONOSCOPY W/ POLYPECTOMY     pt to bring records  . COLONOSCOPY WITH PROPOFOL N/A 06/05/2016   Procedure: COLONOSCOPY WITH PROPOFOL;  Surgeon: Doran Stabler, MD;  Location: [facility];  Service: Gastroenterology;  Laterality: N/A;  . TONSILLECTOMY      Family Hx:  Family History  Problem Relation Age of Onset  . Prostate cancer Father   . Alcohol abuse Father   . Emphysema Father        smoked  . Diabetes Sister   . Colon cancer Neg Hx   . Colon polyps Neg Hx   . Stomach cancer Neg Hx   . Rectal cancer Neg Hx     Social History:  reports that he has been smoking cigarettes. He has a 30.00 pack-year smoking history. He has never used smokeless tobacco. He reports that he does not drink alcohol or use drugs.  Allergies: No Known Allergies  Medications: Prior  to Admission medications   Medication Sig Start Date End Date Taking? Authorizing Provider  atorvastatin (LIPITOR) 80 MG tablet Take 1 tablet (80 mg total) by mouth daily at 6 PM. 11/17/18  Yes Stallings, Zoe A, MD  calcitRIOL (ROCALTROL) 0.25 MCG capsule Take 0.25 mcg by mouth daily. 12/30/18  Yes [provider]  carvedilol (COREG) 25 MG tablet Take 1 tablet (25 mg total) by mouth 2 (two) times daily. 11/17/18  Yes Forrest Moron, MD  cetirizine (ZYRTEC) 10 MG tablet TAKE ONE TABLET BY MOUTH ONCE DAILY FOR ALLERGIES 12/09/18  Yes Stallings, Zoe A, MD  cinacalcet (SENSIPAR) 60 MG tablet Take 60 mg by mouth daily. 01/05/19  Yes [provider]  ferrous sulfate 325 (65 FE) MG tablet Take 1 tablet (325 mg total) by mouth daily. 08/18/18  Yes  Stallings, Zoe A, MD  fluticasone (FLONASE) 50 MCG/ACT nasal spray USE TWO SPRAYS INTO EACH NOSTRILS DAILY 11/17/18  Yes Stallings, Zoe A, MD  furosemide (LASIX) 80 MG tablet 2 Tablets in the AM and 2 Tablets at lunchtime   Yes [provider]  GAVILAX powder TAKE ONE CAPFUL (17 GRAMS) BY MOUTH 2 (TWO) TIMES DAILY AS NEEDED. Patient taking differently: Take 17 g by mouth 2 (two) times daily as needed for mild constipation.  11/28/18  Yes Wendie Agreste, MD  hydrALAZINE (APRESOLINE) 100 MG tablet Take 1 tablet (100 mg total) by mouth 3 (three) times daily. 11/17/18  Yes Stallings, Zoe A, MD  Insulin Glargine (LANTUS SOLOSTAR) 100 UNIT/ML Solostar Pen Inject 45 Units into the skin daily. INJECT 30 UNITS UNDER THE SKIN EVERY MORNING AND INJECT 15 UNITS EVERY NIGHT Patient taking differently: Inject 45 Units into the skin daily.  10/20/18  Yes Shamleffer, Melanie Crazier, MD  insulin lispro (HUMALOG KWIKPEN) 100 UNIT/ML KwikPen Inject 0.08 mLs (8 Units total) into the skin 3 (three) times daily. Max daily dose of 40 units daily Patient taking differently: Inject 10-20 Units into the skin 3 (three) times daily. Max daily dose of 40 units daily 10/20/18  Yes Shamleffer, Melanie Crazier, MD  metolazone (ZAROXOLYN) 5 MG tablet Take 1 tablet (5 mg total) by mouth daily. 06/13/17  Yes Forrest Moron, MD  multivitamin-iron-minerals-folic acid (CENTRUM) chewable tablet Chew 1 tablet by mouth daily.   Yes [provider]  Nicotine 21-14-7 MG/24HR KIT Place 1 patch onto the skin daily. 01/24/19  Yes Parrett, Tammy S, NP  Olopatadine HCl 0.2 % SOLN Apply 1 drop to eye daily. Patient taking differently: Place 1 drop into both eyes daily.  12/09/18  Yes Stallings, Zoe A, MD  Oxycodone HCl 10 MG TABS Take 10 mg by mouth every 8 (eight) hours. 02/01/19  Yes [provider]  oxycodone-acetaminophen (LYNOX) 10-300 MG tablet Take 1 tablet by mouth every 4 (four) hours as needed for pain.   Yes  [provider]  Potassium Chloride ER 20 MEQ TBCR Take 20 mEq by mouth 2 (two) times daily. 10/08/17  Yes Stallings, Zoe A, MD  pregabalin (LYRICA) 100 MG capsule Take 100 mg by mouth 2 (two) times a day. 02/01/19  Yes [provider]  verapamil (VERELAN PM) 180 MG 24 hr capsule Take 1 capsule (180 mg total) by mouth at bedtime. Patient taking differently: Take 360 mg by mouth at bedtime.  08/18/18  Yes Delia Chimes A, MD  Accu-Chek Softclix Lancets lancets Use as instructed 11/17/18   Delia Chimes A, MD  clobetasol cream (TEMOVATE) 0.05 % APPLY TO  AFFECED AREAS(S) TOPICALLY TWICE DAILY Patient not taking: Reported on 02/20/2019 11/17/18   Delia Chimes A, MD  glucose blood (ACCU-CHEK AVIVA PLUS) test strip 1 each by Other route 5 (five) times daily. Use as instructed 11/17/18   Delia Chimes A, MD  glycerin adult 2 g suppository Place 1 suppository rectally as needed for constipation. Patient not taking: Reported on 01/24/2019 10/18/18   Wendie Agreste, MD  Insulin Pen Needle (B-D UF III MINI PEN NEEDLES) 31G X 5 MM MISC Four times daily 11/17/18   Forrest Moron, MD  [facility] 4 MG/0.1ML LIQD nasal spray kit Place 2 mLs into the nose See admin instructions. May repeat dose every 2 to 3 minutes until responsive or ems arrives. 01/04/19   [provider]  phenylephrine (,USE FOR PREPARATION-H,) 0.25 % suppository Place 1 suppository rectally 2 (two) times daily as needed for hemorrhoids. Patient not taking: Reported on 01/24/2019 12/21/18   Doran Stabler, MD    I have reviewed the patient's current medications.  (note metolazone is MWF dosing)  Labs:  BMP Latest Ref Rng & Units 02/21/2019 02/20/2019 02/20/2019  Glucose 70 - 99 mg/dL 48(L) - 58(L)  BUN 6 - 20 mg/dL 73(H) - 76(H)  Creatinine 0.61 - 1.24 mg/dL 9.22(H) 9.24(H) 8.88(H)  BUN/Creat Ratio 9 - 20 - - -  Sodium 135 - 145 mmol/L 143 - 144  Potassium 3.5 - 5.1 mmol/L 4.0 - 4.7  Chloride 98 - 111 mmol/L 107 - 107   CO2 22 - 32 mmol/L 24 - 25  Calcium 8.9 - 10.3 mg/dL 8.7(L) - 8.4(L)    Urinalysis    Component Value Date/Time   COLORURINE YELLOW 10/13/2015 1659   APPEARANCEUR CLOUDY (A) 10/13/2015 1659   LABSPEC 1.015 12/22/2016 1210   PHURINE 5.5 12/22/2016 1210   GLUCOSEU 100 (A) 12/22/2016 1210   HGBUR TRACE (A) 12/22/2016 1210   BILIRUBINUR NEGATIVE 12/22/2016 1210   KETONESUR NEGATIVE 12/22/2016 1210   PROTEINUR >=300 (A) 12/22/2016 1210   UROBILINOGEN 0.2 12/22/2016 1210   NITRITE NEGATIVE 12/22/2016 1210   LEUKOCYTESUR TRACE (A) 12/22/2016 1210     ROS:  Pertinent items noted in HPI and remainder of comprehensive ROS otherwise negative.  Physical Exam: Vitals:   02/21/19 0501 02/21/19 0933  BP: (!) 141/80 (!) 160/82  Pulse: (!) 54 (!) 59  Resp: 19 18  Temp: 97.6 F (36.4 C)   SpO2: 100% 97%     General: adult male in bed in NAD  HEENT: NCAT Moist MM Eyes:EOMI sclera anicteric Neck: supple; trachea midline; JVD+ Heart: S1S2; brady; no rub Lungs: clear to auscultation; normal work of breathing at rest  Abdomen:softly distended/obese/NT; normal bowel sounds Extremities: 1+ edema bilateral LE's Skin: no rash on extremities exposed Neuro: alert and oriented x 4; conversant Psych normal mood and affect Access: LUE AVF with bruit and thrill   Assessment/Plan:  # 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  Plan for HD tomorrow as well.  -We will need outpatient unit - reached out to initiate CLIP (does not have a unit yet)   # Acute on chronic diastolic CHF  - EF 57-26% per TTE 2017 - optimize volume with HD ; feel pt likely somewhat diuretic resistant.   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   - Will receive calcitriol at outpatient HD unit once he is discharged   # Diabetes mellitus with hypoglcyemia - repeat BG is 78; diabetes per primary team   Claudia Desanctis 02/21/2019, 11:34 AM

## 2019-02-21 NOTE — Progress Notes (Signed)
CBG 48  at 0701. Rechecked 15 mins after given graham crackers and apple juice. CBG 78.

## 2019-02-21 NOTE — Plan of Care (Signed)
  Problem: Education: Goal: Knowledge of General Education information will improve Description Including pain rating scale, medication(s)/side effects and non-pharmacologic comfort measures Outcome: Progressing   Problem: Health Behavior/Discharge Planning: Goal: Ability to manage health-related needs will improve Outcome: Progressing   

## 2019-02-21 NOTE — Progress Notes (Signed)
Pt stated that he would update family on plan of care.   Paulla Fore, RN

## 2019-02-21 NOTE — Progress Notes (Signed)
Emilio Math, RN called and made aware the patient's HD tx has been moved to 02/22/23 by Nephrologist.

## 2019-02-21 NOTE — Progress Notes (Signed)
New Admission Note: ? Arrival Method: Stretcher  Mental Orientation: A/O x 4 Telemetry: Box #09 SB Assessment: Completed Skin: Refer to flowsheet IV: Right AC  Pain: 6/10 oxycodone Tubes: Safety Measures: Safety Fall Prevention Plan discussed with patient. Admission: Completed 5 [place] Orientation: Patient has been orientated to the room, unit and the staff. Family:  Orders have been reviewed and are being implemented. Will continue to monitor the patient. Call light has been placed within reach and bed alarm has been activated.  ? [employer], [place]

## 2019-02-21 NOTE — Progress Notes (Signed)
PROGRESS NOTE    Jerry Cantrell  QRF:[id number] DOB: [birth year] DOA: 02/20/2019 PCP: Forrest Moron, MD      Brief Narrative:  [name] is a 53 y.o. M with end-stage renal failure follows with Dr. Justin Mend, had a left upper extremity fistula placed about 2 years ago but had not initiated dialysis yet.  He states that he started feeling worsening shortness of breath starting Friday.   He also noticed worsening swelling of his peripheral extremities as well as his abdomen.  In ER, Cr 8.8, BNP 457, trop 0.03, hypoxic, CXR with right base opacity, WBC normal, no fever, COVID-.  Admitted for iniation of dialysis.    Assessment & Plan:  Acute hypoxic respiratory failure from acute on chronic renal failure stage V -Consult Nephrology appreciate cares -Plan for HD today -Anemia and secondary hyperparathyroidism regimen per Nephrology   Hypertension BP elevated -HD today  -Continue carvedilol -Hold Lasix and metolazone per Nephrology -Continue verapamil  Diabetes Hypoglycemic this AM, now already hyperglycemic. -Continue SSI, reudce to renal dosing -Reduce home Lantus from 40 to 12 units once daily -Continue atorvastatin  Chronic pain -Continue oxycodone  Anemia of chronic renal failure Stable  Thrombocytopenia Mild, unkonwn cause  Smoking -Nicotine patch       MDM and disposition: The below labs and imaging reports were reviewed and summarized above.  Medication management as above.  The patient was admitted with acute hypoxic respiratory failure from renal failure.  He will be seen by Nephrology today. Defer management with diuretics vs dialysis to them.         DVT prophylaxis: Heparin Code Status: FULL Family Communication: None    Consultants:   Nephrology  Procedures:   6/23 initiation of HD  Antimicrobials:   None    Subjective: Feeling tired, blurry vision, sleepy, no nauesea or itching.  No vomiting.  Mild dyspnea with exertion, leg  sewlling.  Objective: Vitals:   02/20/19 2115 02/21/19 0011 02/21/19 0501 02/21/19 0933  BP: (!) 203/93  (!) 141/80 (!) 160/82  Pulse: 66 65 (!) 54 (!) 59  Resp: 20 18 19 18   Temp: 98.5 F (36.9 C)  97.6 F (36.4 C)   TempSrc: Oral  Oral   SpO2: 95% 97% 100% 97%  Weight: 128.6 kg     Height: 6' (1.829 m)       Intake/Output Summary (Last 24 hours) at 02/21/2019 1518 Last data filed at 02/21/2019 1501 Gross per 24 hour  Intake 177 ml  Output 1350 ml  Net -1173 ml   Filed Weights   02/20/19 1419 02/20/19 2115  Weight: 124.7 kg 128.6 kg    Examination: General appearance:  adult male, alert and in no acute distress.  Sitting on edge of bed, eating HEENT: Anicteric, conjunctiva pink, lids and lashes normal. No nasal deformity, discharge, epistaxis.  Lips moist, dentition normal, OP moist, no oral lesions, hearing normal.   Skin: Warm and dry.  No jaundice.  No suspicious rashes or lesions. Cardiac: RRR, nl S1-S2, no murmurs appreciated.  Capillary refill is brisk.  JVPnot visible.  1+ LE edema.  Radial pulses 2+ and symmetric. Respiratory: Normal respiratory rate and rhythm.  CTAB without rales or wheezes. Abdomen: Abdomen soft.  No TTP. No ascites, distension, hepatosplenomegaly.   MSK: No deformities or effusions. Neuro: Awake and alert but sleepy, slowed.  EOMI, moves all extremities. Speech fluent.    Psych: Sensorium intact and responding to questions, attention normal. Affect blunted.  Judgment and insight appear  normal.    Data Reviewed: I have personally reviewed following labs and imaging studies:  CBC: Recent Labs  Lab 02/20/19 1453 02/20/19 2307 02/21/19 0513  WBC 5.7 6.0 4.0  NEUTROABS 4.0  --   --   HGB 10.1* 10.1* 10.0*  HCT 33.3* 32.3* 32.7*  MCV 97.9 95.0 95.9  PLT 109* 104* 409*   Basic Metabolic Panel: Recent Labs  Lab 02/20/19 1453 02/20/19 2307 02/21/19 0513  NA 144  --  143  K 4.7  --  4.0  CL 107  --  107  CO2 25  --  24  GLUCOSE 58*   --  48*  BUN 76*  --  73*  CREATININE 8.88* 9.24* 9.22*  CALCIUM 8.4*  --  8.7*   GFR: Estimated Creatinine Clearance: 12.8 mL/min (A) (by C-G formula based on SCr of 9.22 mg/dL (H)). Liver Function Tests: Recent Labs  Lab 02/20/19 1453  AST 42*  ALT 41  ALKPHOS 91  BILITOT 0.4  PROT 6.7  ALBUMIN 3.3*   No results for input(s): LIPASE, AMYLASE in the last 168 hours. No results for input(s): AMMONIA in the last 168 hours. Coagulation Profile: No results for input(s): INR, PROTIME in the last 168 hours. Cardiac Enzymes: Recent Labs  Lab 02/20/19 1453 02/20/19 2307 02/21/19 0513  TROPONINI 0.03* <0.03 <0.03   BNP (last 3 results) No results for input(s): PROBNP in the last 8760 hours. HbA1C: No results for input(s): HGBA1C in the last 72 hours. CBG: Recent Labs  Lab 02/20/19 1636 02/20/19 2116 02/21/19 0701 02/21/19 0726 02/21/19 1140  GLUCAP 113* 180* 48* 78 237*   Lipid Profile: No results for input(s): CHOL, HDL, LDLCALC, TRIG, CHOLHDL, LDLDIRECT in the last 72 hours. Thyroid Function Tests: No results for input(s): TSH, T4TOTAL, FREET4, T3FREE, THYROIDAB in the last 72 hours. Anemia Panel: No results for input(s): VITAMINB12, FOLATE, FERRITIN, TIBC, IRON, RETICCTPCT in the last 72 hours. Urine analysis:    Component Value Date/Time   COLORURINE YELLOW 10/13/2015 1659   APPEARANCEUR CLOUDY (A) 10/13/2015 1659   LABSPEC 1.015 12/22/2016 1210   PHURINE 5.5 12/22/2016 1210   GLUCOSEU 100 (A) 12/22/2016 1210   HGBUR TRACE (A) 12/22/2016 1210   BILIRUBINUR NEGATIVE 12/22/2016 1210   KETONESUR NEGATIVE 12/22/2016 1210   PROTEINUR >=300 (A) 12/22/2016 1210   UROBILINOGEN 0.2 12/22/2016 1210   NITRITE NEGATIVE 12/22/2016 1210   LEUKOCYTESUR TRACE (A) 12/22/2016 1210   Sepsis Labs: @LABRCNTIP (procalcitonin:4,lacticacidven:4)  ) Recent Results (from the past 240 hour(s))  SARS Coronavirus 2 (CEPHEID- Performed in [facility] hospital lab), Hosp Order      Status: None   Collection Time: 02/20/19  2:54 PM   Specimen: Nasopharyngeal Swab  Result Value Ref Range Status   SARS Coronavirus 2 NEGATIVE NEGATIVE Final    Comment: (NOTE) If result is NEGATIVE SARS-CoV-2 target nucleic acids are NOT DETECTED. The SARS-CoV-2 RNA is generally detectable in upper and lower  respiratory specimens during the acute phase of infection. The lowest  concentration of SARS-CoV-2 viral copies this assay can detect is 250  copies / mL. A negative result does not preclude SARS-CoV-2 infection  and should not be used as the sole basis for treatment or other  patient management decisions.  A negative result may occur with  improper specimen collection / handling, submission of specimen other  than nasopharyngeal swab, presence of viral mutation(s) within the  areas targeted by this assay, and inadequate number of viral copies  (<250 copies /  mL). A negative result must be combined with clinical  observations, patient history, and epidemiological information. If result is POSITIVE SARS-CoV-2 target nucleic acids are DETECTED. The SARS-CoV-2 RNA is generally detectable in upper and lower  respiratory specimens dur ing the acute phase of infection.  Positive  results are indicative of active infection with SARS-CoV-2.  Clinical  correlation with patient history and other diagnostic information is  necessary to determine patient infection status.  Positive results do  not rule out bacterial infection or co-infection with other viruses. If result is PRESUMPTIVE POSTIVE SARS-CoV-2 nucleic acids MAY BE PRESENT.   A presumptive positive result was obtained on the submitted specimen  and confirmed on repeat testing.  While 2019 novel coronavirus  (SARS-CoV-2) nucleic acids may be present in the submitted sample  additional confirmatory testing may be necessary for epidemiological  and / or clinical management purposes  to differentiate between  SARS-CoV-2 and other  Sarbecovirus currently known to infect humans.  If clinically indicated additional testing with an alternate test  methodology [phone]) is advised. The SARS-CoV-2 RNA is generally  detectable in upper and lower respiratory sp ecimens during the acute  phase of infection. The expected result is Negative. Fact Sheet for Patients:  StrictlyIdeas.no Fact Sheet for Healthcare Providers: BankingDealers.co.za This test is not yet approved or cleared by the [place] FDA and has been authorized for detection and/or diagnosis of SARS-CoV-2 by FDA under an Emergency Use Authorization (EUA).  This EUA will remain in effect (meaning this test can be used) for the duration of the COVID-19 declaration under Section 564(b)(1) of the Act, 21 U.S.C. section 360bbb-3(b)(1), unless the authorization is terminated or revoked sooner. Performed at [facility], [place] [address], [place], [place] [address]          Radiology Studies: Dg Chest Portable 1 View  Result Date: 02/20/2019 CLINICAL DATA:  Hypoglycemia with shortness of breath and lethargy EXAM: PORTABLE CHEST 1 VIEW COMPARISON:  Chest radiograph October 27, 2016 and chest CT January 31, 2018 FINDINGS: There is airspace opacity throughout the right base region. Lungs elsewhere are clear. There is cardiomegaly with mild pulmonary venous hypertension. No adenopathy. No bone lesions. IMPRESSION: Airspace opacity right base region consistent with pneumonia. Cardiomegaly with pulmonary vascular congestion present. No evident adenopathy. Electronically Signed   By: Lowella Grip III M.D.   On: 02/20/2019 15:45        Scheduled Meds: . atorvastatin  80 mg Oral q1800  . calcitRIOL  0.25 mcg Oral Daily  . carvedilol  25 mg Oral BID  . [START ON 02/22/2019] Chlorhexidine Gluconate Cloth  6 each Topical Q0600  . cinacalcet  60 mg Oral Q supper  . ferrous sulfate  325 mg Oral Daily   . heparin  5,000 Units Subcutaneous Q8H  . hydrALAZINE  100 mg Oral TID  . insulin aspart  0-9 Units Subcutaneous TID WC  . insulin glargine  12 Units Subcutaneous Daily  . nicotine  21 mg Transdermal Daily  . oxyCODONE  10 mg Oral Q8H  . sodium chloride flush  3 mL Intravenous Q12H  . sodium chloride flush  3 mL Intravenous Q12H  . verapamil  360 mg Oral QHS   Continuous Infusions: . sodium chloride    . sodium chloride    . sodium chloride       LOS: 1 day    Time spent: 25 minutes    Edwin Dada, MD [facility] 02/21/2019, 3:18 PM  Please page through AMION:  [email or web address] Password [email or web address] If 7PM-7AM, please contact night-coverage

## 2019-02-21 NOTE — Progress Notes (Signed)
Renal Navigator received call from Dr. Shelda Altes to initiate OP HD referral. Renal Navigator spoke with patient to complete assessment and submitted referral to [facility] as patient states he has previously visited this clinic. Patient's address is equidistant to [facility] and [place] clinics. He states his first choice is [facility] since he has visited, but is okay with [facility] as a second choice if no seats available at [facility].  Renal Navigator will follow up with Nephrologist and patient once seat schedule has been obtained.  Alphonzo Cruise, [place] Renal Navigator [phone]

## 2019-02-22 ENCOUNTER — Other Ambulatory Visit: Payer: Self-pay

## 2019-02-22 DIAGNOSIS — N186 End stage renal disease: Secondary | ICD-10-CM

## 2019-02-22 DIAGNOSIS — E785 Hyperlipidemia, unspecified: Secondary | ICD-10-CM

## 2019-02-22 DIAGNOSIS — E8770 Fluid overload, unspecified: Secondary | ICD-10-CM

## 2019-02-22 LAB — GLUCOSE, CAPILLARY
Glucose-Capillary: 142 mg/dL — ABNORMAL HIGH (ref 70–99)
Glucose-Capillary: 357 mg/dL — ABNORMAL HIGH (ref 70–99)
Glucose-Capillary: 335 mg/dL — ABNORMAL HIGH (ref 70–99)
Glucose-Capillary: 305 mg/dL — ABNORMAL HIGH (ref 70–99)

## 2019-02-22 LAB — BASIC METABOLIC PANEL
Anion gap: 13 (ref 5–15)
BUN: 79 mg/dL — ABNORMAL HIGH (ref 6–20)
CO2: 25 mmol/L (ref 22–32)
Calcium: 8.8 mg/dL — ABNORMAL LOW (ref 8.9–10.3)
Chloride: 103 mmol/L (ref 98–111)
Creatinine, Ser: 9.77 mg/dL — ABNORMAL HIGH (ref 0.61–1.24)
GFR calc Af Amer: 6 mL/min — ABNORMAL LOW (ref >60)
GFR calc non Af Amer: 5 mL/min — ABNORMAL LOW (ref >60)
Glucose, Bld: 138 mg/dL — ABNORMAL HIGH (ref 70–99)
Potassium: 4.4 mmol/L (ref 3.5–5.1)
Sodium: 141 mmol/L (ref 135–145)

## 2019-02-22 LAB — HEPATITIS B SURFACE ANTIGEN: Hepatitis B Surface Ag: NEGATIVE

## 2019-02-22 LAB — CBC
HCT: 34.2 % — ABNORMAL LOW (ref 39.0–52.0)
Hemoglobin: 10.5 g/dL — ABNORMAL LOW (ref 13.0–17.0)
MCH: 29.6 pg (ref 26.0–34.0)
MCHC: 30.7 g/dL (ref 30.0–36.0)
MCV: 96.3 fL (ref 80.0–100.0)
Platelets: 124 K/uL — ABNORMAL LOW (ref 150–400)
RBC: 3.55 MIL/uL — ABNORMAL LOW (ref 4.22–5.81)
RDW: 13 % (ref 11.5–15.5)
WBC: 4.6 K/uL (ref 4.0–10.5)
nRBC: 0 % (ref 0.0–0.2)

## 2019-02-22 LAB — HEPATITIS B CORE ANTIBODY, TOTAL: Hep B Core Total Ab: POSITIVE — AB

## 2019-02-22 LAB — PHOSPHORUS: Phosphorus: 9.4 mg/dL — ABNORMAL HIGH (ref 2.5–4.6)

## 2019-02-22 LAB — HEPATITIS B SURFACE ANTIBODY,QUALITATIVE: Hep B S Ab: REACTIVE

## 2019-02-22 MED ORDER — ALBUTEROL SULFATE (2.5 MG/3ML) 0.083% IN NEBU
2.5000 mg | INHALATION_SOLUTION | RESPIRATORY_TRACT | Status: DC | PRN
  Administered 2019-02-22 – 2019-02-24 (×4): 2.5 mg via RESPIRATORY_TRACT
  Filled 2019-02-22 (×4): qty 3

## 2019-02-22 MED ORDER — CARVEDILOL 12.5 MG PO TABS
12.5000 mg | ORAL_TABLET | Freq: Two times a day (BID) | ORAL | Status: DC
  Administered 2019-02-22 – 2019-02-24 (×5): 12.5 mg via ORAL
  Filled 2019-02-22 (×5): qty 1

## 2019-02-22 MED ORDER — CHLORHEXIDINE GLUCONATE CLOTH 2 % EX PADS
6.0000 | MEDICATED_PAD | Freq: Every day | CUTANEOUS | Status: DC
  Administered 2019-02-22 – 2019-02-23 (×2): 6 via TOPICAL

## 2019-02-22 MED ORDER — CHLORHEXIDINE GLUCONATE CLOTH 2 % EX PADS
6.0000 | MEDICATED_PAD | Freq: Every day | CUTANEOUS | Status: DC
  Administered 2019-02-24: 6 via TOPICAL

## 2019-02-22 MED ORDER — INSULIN ASPART 100 UNIT/ML ~~LOC~~ SOLN
8.0000 [IU] | Freq: Once | SUBCUTANEOUS | Status: AC
  Administered 2019-02-22: 8 [IU] via SUBCUTANEOUS

## 2019-02-22 MED ORDER — RENA-VITE PO TABS
1.0000 | ORAL_TABLET | Freq: Every day | ORAL | Status: DC
  Administered 2019-02-22 – 2019-02-23 (×2): 1 via ORAL
  Filled 2019-02-22 (×2): qty 1

## 2019-02-22 NOTE — Progress Notes (Signed)
RT placed patient on CPAP with 2L O2 bled into circuit. Patient tolerating well at this time. No respiratory distress noted at this time. RT will monitor as needed.

## 2019-02-22 NOTE — Progress Notes (Signed)
Patient has been accepted at [facility] for OP HD treatment on a MWF schedule with a seat time of 12:35pm. He needs to arrive for his first day of treatment at 11:30am to complete paperwork. [facility] and [place] clinics do not have seat availability at this time.  Renal Navigator notified Nephrology team/Dr. Royce Macadamia and Gerrianne Scale PA. Renal Navigator spoke with patient to inform of clinic and schedule. Patient was appreciative and understanding.  [facility] [address] [place] [phone]  Alphonzo Cruise, [place] Renal Navigator [phone]

## 2019-02-22 NOTE — Progress Notes (Signed)
PROGRESS NOTE    Jerry Cantrell  NGE:[id number] DOB: 1966/07/15 DOA: 02/20/2019 PCP: Forrest Moron, MD   Brief Narrative: Jerry Cantrell is a 53 y.o. male with CKD stage V, hypertension, chronic pain. He presented secondary to shortness of breath secondary to volume overload secondary to ESRD. Started on Lasix and will start HD.   Assessment & Plan:   Principal Problem:   Volume overload Active Problems:   Insulin dependent type 2 diabetes mellitus, uncontrolled (HCC)   Hyperlipemia   Hypertension   Chronic low back pain   Diabetic peripheral neuropathy associated with type 2 diabetes mellitus (HCC)   Chronic kidney disease, stage IV (severe) (HCC)   Chronic pain syndrome   Acute respiratory failure Secondary to fluid overload secondary to ESRD. -Continue oxygen and wean to room air as able.  CKD stage V now ESRD Nephrology on board. Started on Lasix. Starting IHD. CLIP in process.  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 No current concerns for angioedema.  Chronic pain -Continue oxycodone, Lyrica (renally dosed)  Anemia of chronic renal failure Hemoglobin stable.  Thrombocytopenia Stable currently. No prior history. No active bleeding. Outpatient follow-up unless acute issues arise.  Tobacco use -Continue nicotine patch   DVT prophylaxis: Heparin subq Code Status:   Code Status: Full Code Family Communication: None Disposition Plan: Discharge pending outpatient dialysis availability   Consultants:   Nephrology  Procedures:   HD  Antimicrobials:  None    Subjective: Shortness of breath  Objective: Vitals:   02/22/19 1152 02/22/19 1200 02/22/19 1230 02/22/19 1300  BP: (!) 146/84 (!) 155/91 (!) 145/84 137/84  Pulse: (!) 54 (!) 56 (!) 57 (!) 54  Resp:      Temp:      TempSrc:      SpO2:       Weight:      Height:        Intake/Output Summary (Last 24 hours) at 02/22/2019 1418 Last data filed at 02/22/2019 1100 Gross per 24 hour  Intake 687 ml  Output 2300 ml  Net -1613 ml   Filed Weights   02/20/19 2115 02/21/19 2102 02/22/19 1150  Weight: 128.6 kg 128.8 kg 129.2 kg    Examination:  General exam: Appears calm and comfortable HEENT: large tongue Respiratory system: Rales. Respiratory effort normal. Cardiovascular system: S1 & S2 heard, RRR. No murmurs, rubs, gallops or clicks. Gastrointestinal system: Abdomen is nondistended, soft and nontender. No organomegaly or masses felt. Normal bowel sounds heard. Central nervous system: Alert and oriented. No focal neurological deficits. Extremities: No edema. No calf tenderness Skin: No cyanosis. No rashes Psychiatry: Judgement and insight appear normal. Mood & affect appropriate.    Data Reviewed: I have personally reviewed following labs and imaging studies  CBC: Recent Labs  Lab 02/20/19 1453 02/20/19 2307 02/21/19 0513 02/22/19 0629  WBC 5.7 6.0 4.0 4.6  NEUTROABS 4.0  --   --   --   HGB 10.1* 10.1* 10.0* 10.5*  HCT 33.3* 32.3* 32.7* 34.2*  MCV 97.9 95.0 95.9 96.3  PLT 109* 104* 109* 401*   Basic Metabolic Panel: Recent Labs  Lab 02/20/19 1453 02/20/19 2307 02/21/19 0513 02/22/19 0629  NA 144  --  143 141  K 4.7  --  4.0 4.4  CL 107  --  107 103  CO2 25  --  24 25  GLUCOSE 58*  --  48* 138*  BUN 76*  --  73* 79*  CREATININE 8.88* 9.24* 9.22* 9.77*  CALCIUM 8.4*  --  8.7* 8.8*  PHOS  --   --   --  9.4*   GFR: Estimated Creatinine Clearance: 12.1 mL/min (A) (by C-G formula based on SCr of 9.77 mg/dL (H)). Liver Function Tests: Recent Labs  Lab 02/20/19 1453  AST 42*  ALT 41  ALKPHOS 91  BILITOT 0.4  PROT 6.7  ALBUMIN 3.3*   No results for input(s): LIPASE, AMYLASE in the last 168 hours. No results for input(s): AMMONIA in the last 168 hours. Coagulation Profile: No results for  input(s): INR, PROTIME in the last 168 hours. Cardiac Enzymes: Recent Labs  Lab 02/20/19 1453 02/20/19 2307 02/21/19 0513  TROPONINI 0.03* <0.03 <0.03   BNP (last 3 results) No results for input(s): PROBNP in the last 8760 hours. HbA1C: No results for input(s): HGBA1C in the last 72 hours. CBG: Recent Labs  Lab 02/21/19 1140 02/21/19 1639 02/21/19 2103 02/22/19 0648 02/22/19 1129  GLUCAP 237* 347* 229* 142* 357*   Lipid Profile: No results for input(s): CHOL, HDL, LDLCALC, TRIG, CHOLHDL, LDLDIRECT in the last 72 hours. Thyroid Function Tests: No results for input(s): TSH, T4TOTAL, FREET4, T3FREE, THYROIDAB in the last 72 hours. Anemia Panel: No results for input(s): VITAMINB12, FOLATE, FERRITIN, TIBC, IRON, RETICCTPCT in the last 72 hours. Sepsis Labs: Recent Labs  Lab 02/20/19 1600  LATICACIDVEN 1.0    Recent Results (from the past 240 hour(s))  SARS Coronavirus 2 (CEPHEID- Performed in [facility] hospital lab), Hosp Order     Status: None   Collection Time: 02/20/19  2:54 PM   Specimen: Nasopharyngeal Swab  Result Value Ref Range Status   SARS Coronavirus 2 NEGATIVE NEGATIVE Final    Comment: (NOTE) If result is NEGATIVE SARS-CoV-2 target nucleic acids are NOT DETECTED. The SARS-CoV-2 RNA is generally detectable in upper and lower  respiratory specimens during the acute phase of infection. The lowest  concentration of SARS-CoV-2 viral copies this assay can detect is 250  copies / mL. A negative result does not preclude SARS-CoV-2 infection  and should not be used as the sole basis for treatment or other  patient management decisions.  A negative result may occur with  improper specimen collection / handling, submission of specimen other  than nasopharyngeal swab, presence of viral mutation(s) within the  areas targeted by this assay, and inadequate number of viral copies  (<250 copies / mL). A negative result must be combined with clinical  observations,  patient history, and epidemiological information. If result is POSITIVE SARS-CoV-2 target nucleic acids are DETECTED. The SARS-CoV-2 RNA is generally detectable in upper and lower  respiratory specimens dur ing the acute phase of infection.  Positive  results are indicative of active infection with SARS-CoV-2.  Clinical  correlation with patient history and other diagnostic information is  necessary to determine patient infection status.  Positive results do  not rule out bacterial infection or co-infection with other viruses. If result is PRESUMPTIVE POSTIVE SARS-CoV-2 nucleic acids MAY BE PRESENT.   A presumptive positive result was obtained on the submitted specimen  and confirmed on repeat testing.  While 2019 novel coronavirus  (SARS-CoV-2) nucleic acids may be present in the submitted sample  additional confirmatory testing may be necessary for epidemiological  and / or clinical management purposes  to differentiate between  SARS-CoV-2 and other Sarbecovirus currently known to infect humans.  If clinically indicated additional testing  with an alternate test  methodology [phone] is advised. The SARS-CoV-2 RNA is generally  detectable in upper and lower respiratory sp ecimens during the acute  phase of infection. The expected result is Negative. Fact Sheet for Patients:  StrictlyIdeas.no Fact Sheet for Healthcare Providers: BankingDealers.co.za This test is not yet approved or cleared by the [place] FDA and has been authorized for detection and/or diagnosis of SARS-CoV-2 by FDA under an Emergency Use Authorization (EUA).  This EUA will remain in effect (meaning this test can be used) for the duration of the COVID-19 declaration under Section 564(b)(1) of the Act, 21 U.S.C. section 360bbb-3(b)(1), unless the authorization is terminated or revoked sooner. Performed at [facility], [place] [address],  [place], [place] [address]          Radiology Studies: Dg Chest Portable 1 View  Result Date: 02/20/2019 CLINICAL DATA:  Hypoglycemia with shortness of breath and lethargy EXAM: PORTABLE CHEST 1 VIEW COMPARISON:  Chest radiograph October 27, 2016 and chest CT January 31, 2018 FINDINGS: There is airspace opacity throughout the right base region. Lungs elsewhere are clear. There is cardiomegaly with mild pulmonary venous hypertension. No adenopathy. No bone lesions. IMPRESSION: Airspace opacity right base region consistent with pneumonia. Cardiomegaly with pulmonary vascular congestion present. No evident adenopathy. Electronically Signed   By: Lowella Grip III M.D.   On: 02/20/2019 15:45        Scheduled Meds: . atorvastatin  80 mg Oral q1800  . calcitRIOL  0.25 mcg Oral Daily  . carvedilol  12.5 mg Oral BID  . Chlorhexidine Gluconate Cloth  6 each Topical Q0600  . cinacalcet  60 mg Oral Q supper  . heparin  5,000 Units Subcutaneous Q8H  . hydrALAZINE  100 mg Oral TID  . insulin aspart  0-9 Units Subcutaneous TID WC  . insulin glargine  12 Units Subcutaneous Daily  . multivitamin  1 tablet Oral QHS  . nicotine  21 mg Transdermal Daily  . oxyCODONE  10 mg Oral Q8H  . pregabalin  25 mg Oral BID  . sodium chloride flush  3 mL Intravenous Q12H  . sodium chloride flush  3 mL Intravenous Q12H  . verapamil  360 mg Oral QHS   Continuous Infusions: . sodium chloride    . sodium chloride    . sodium chloride       LOS: 2 days     Cordelia Poche, MD [facility] 02/22/2019, 2:18 PM  If 7PM-7AM, please contact night-coverage [email or web address]

## 2019-02-22 NOTE — Progress Notes (Signed)
[facility] Progress Note  Assessment/Plan: 1. New ESRD - has functional AVF - first treatment postponed to today due to high inpatient volulme; plan daily HD ^ to 2.5 hr Thursday - titrate BFR on 17 gauge needles; CLIP pending - 2. Acute on chronic CHF - EF 60 - 65% - titrate volume with HD 3. Anemia - hgb 10.5 - no ESA/ IV Fe - d/c oral Fe - if needed - he will be started on IV Fe at outpatient HD 4. Secondary hyperparathyroidism - cotninue home sensipar/calcitriol add P on to am labs to see if he needs binders 5. HTN/volume - coreg 25 bid - may need to decrease dose if BP drops on HD as we lower volume 6. Nutrition - added multivits 7. DM - per primary- has had some hypoglycemia  Myriam Jacobson, PA-C [place] [phone] 02/22/2019,9:56 AM  LOS: 2 days   Subjective:   Some nausea. Legs feel swollen. Breathing ok  Objective Vitals:   02/21/19 2102 02/21/19 2359 02/22/19 0507 02/22/19 0825  BP: (!) 163/96  113/66 130/73  Pulse: 60 68 (!) 52 (!) 52  Resp: 20 18 18    Temp: 98.2 F (36.8 C)  (!) 97.4 F (36.3 C) (!) 97.5 F (36.4 C)  TempSrc: Oral  Oral Oral  SpO2: 98% 97% 98% 97%  Weight: 128.8 kg     Height:       Physical Exam General: NAD alert and conversant Heart: RRR 60s Lungs: dim BS Abdomen: obese distended + BS Extremities: 1 + LE edema  Dialysis Access: left AVF + bruit   Additional Objective Labs: Basic Metabolic Panel: Recent Labs  Lab 02/20/19 1453 02/20/19 2307 02/21/19 0513 02/22/19 0629  NA 144  --  143 141  K 4.7  --  4.0 4.4  CL 107  --  107 103  CO2 25  --  24 25  GLUCOSE 58*  --  48* 138*  BUN 76*  --  73* 79*  CREATININE 8.88* 9.24* 9.22* 9.77*  CALCIUM 8.4*  --  8.7* 8.8*   Liver Function Tests: Recent Labs  Lab 02/20/19 1453  AST 42*  ALT 41  ALKPHOS 91  BILITOT 0.4  PROT 6.7  ALBUMIN 3.3*   No results for input(s): LIPASE, AMYLASE in the last 168 hours. CBC: Recent Labs  Lab  02/20/19 1453 02/20/19 2307 02/21/19 0513 02/22/19 0629  WBC 5.7 6.0 4.0 4.6  NEUTROABS 4.0  --   --   --   HGB 10.1* 10.1* 10.0* 10.5*  HCT 33.3* 32.3* 32.7* 34.2*  MCV 97.9 95.0 95.9 96.3  PLT 109* 104* 109* 124*   Blood Culture    Component Value Date/Time   SDES WRIST 09/17/2018 1328   SPECREQUEST LEFT SYNOVIAL 09/17/2018 1328   CULT  09/17/2018 1328    NO GROWTH 3 DAYS Performed at [facility], [place] [address] [place], [place] [address]    REPTSTATUS 09/20/2018 FINAL 09/17/2018 1328    Cardiac Enzymes: Recent Labs  Lab 02/20/19 1453 02/20/19 2307 02/21/19 0513  TROPONINI 0.03* <0.03 <0.03   CBG: Recent Labs  Lab 02/21/19 0726 02/21/19 1140 02/21/19 1639 02/21/19 2103 02/22/19 0648  GLUCAP 78 237* 347* 229* 142*   Iron Studies: No results for input(s): IRON, TIBC, TRANSFERRIN, FERRITIN in the last 72 hours. Lab Results  Component Value Date   INR 1.26 01/18/2015   Studies/Results: Dg Chest Portable 1 View  Result Date: 02/20/2019 CLINICAL DATA:  Hypoglycemia with shortness of breath and lethargy EXAM: PORTABLE CHEST 1 VIEW COMPARISON:  Chest radiograph October 27, 2016 and chest CT January 31, 2018 FINDINGS: There is airspace opacity throughout the right base region. Lungs elsewhere are clear. There is cardiomegaly with mild pulmonary venous hypertension. No adenopathy. No bone lesions. IMPRESSION: Airspace opacity right base region consistent with pneumonia. Cardiomegaly with pulmonary vascular congestion present. No evident adenopathy. Electronically Signed   By: Lowella Grip III M.D.   On: 02/20/2019 15:45   Medications: . sodium chloride    . sodium chloride    . sodium chloride     . atorvastatin  80 mg Oral q1800  . calcitRIOL  0.25 mcg Oral Daily  . carvedilol  25 mg Oral BID  . Chlorhexidine Gluconate Cloth  6 each Topical Q0600  . cinacalcet  60 mg Oral Q supper  . ferrous sulfate  325 mg Oral Daily  . heparin  5,000 Units  Subcutaneous Q8H  . hydrALAZINE  100 mg Oral TID  . insulin aspart  0-9 Units Subcutaneous TID WC  . insulin glargine  12 Units Subcutaneous Daily  . nicotine  21 mg Transdermal Daily  . oxyCODONE  10 mg Oral Q8H  . pregabalin  25 mg Oral BID  . sodium chloride flush  3 mL Intravenous Q12H  . sodium chloride flush  3 mL Intravenous Q12H  . verapamil  360 mg Oral QHS

## 2019-02-22 NOTE — Progress Notes (Signed)
Renal Navigator received voicemail from patient that he is unhappy about how far the [facility] is from his home. Patient did not answer, but Renal Navigator left him a message noting that the [facility] clinic is 3 additional miles from his home from the 2 clinics initially requested that did not have seats available and that Renal Navigator is extremely sorry for this inconvenience. Renal Navigator suggests that he may be able to be put on a waiting list for transfer when a seat becomes available at one of the closer clinics and asked that he return Renal Navigator's call to discuss further.   Alphonzo Cruise, [place] Renal Navigator [phone]

## 2019-02-22 NOTE — Plan of Care (Signed)
  Problem: Education: Goal: Knowledge of General Education information will improve Description Including pain rating scale, medication(s)/side effects and non-pharmacologic comfort measures Outcome: Progressing   Problem: Health Behavior/Discharge Planning: Goal: Ability to manage health-related needs will improve Outcome: Progressing   

## 2019-02-23 LAB — CBC
HCT: 29.8 % — ABNORMAL LOW (ref 39.0–52.0)
Hemoglobin: 9.1 g/dL — ABNORMAL LOW (ref 13.0–17.0)
MCH: 29.2 pg (ref 26.0–34.0)
MCHC: 30.5 g/dL (ref 30.0–36.0)
MCV: 95.5 fL (ref 80.0–100.0)
Platelets: 110 10*3/uL — ABNORMAL LOW (ref 150–400)
RBC: 3.12 MIL/uL — ABNORMAL LOW (ref 4.22–5.81)
RDW: 12.7 % (ref 11.5–15.5)
WBC: 4.3 10*3/uL (ref 4.0–10.5)
nRBC: 0 % (ref 0.0–0.2)

## 2019-02-23 LAB — RENAL FUNCTION PANEL
Albumin: 2.6 g/dL — ABNORMAL LOW (ref 3.5–5.0)
Anion gap: 13 (ref 5–15)
BUN: 65 mg/dL — ABNORMAL HIGH (ref 6–20)
CO2: 25 mmol/L (ref 22–32)
Calcium: 8.7 mg/dL — ABNORMAL LOW (ref 8.9–10.3)
Chloride: 100 mmol/L (ref 98–111)
Creatinine, Ser: 8.3 mg/dL — ABNORMAL HIGH (ref 0.61–1.24)
GFR calc Af Amer: 8 mL/min — ABNORMAL LOW (ref >60)
GFR calc non Af Amer: 7 mL/min — ABNORMAL LOW (ref >60)
Glucose, Bld: 243 mg/dL — ABNORMAL HIGH (ref 70–99)
Phosphorus: 7.6 mg/dL — ABNORMAL HIGH (ref 2.5–4.6)
Potassium: 3.9 mmol/L (ref 3.5–5.1)
Sodium: 138 mmol/L (ref 135–145)

## 2019-02-23 LAB — GLUCOSE, CAPILLARY
Glucose-Capillary: 158 mg/dL — ABNORMAL HIGH (ref 70–99)
Glucose-Capillary: 248 mg/dL — ABNORMAL HIGH (ref 70–99)
Glucose-Capillary: 143 mg/dL — ABNORMAL HIGH (ref 70–99)
Glucose-Capillary: 225 mg/dL — ABNORMAL HIGH (ref 70–99)

## 2019-02-23 MED ORDER — LIDOCAINE HCL (PF) 1 % IJ SOLN: 5.0000 mL | INTRAMUSCULAR | Status: DC | PRN

## 2019-02-23 MED ORDER — PENTAFLUOROPROP-TETRAFLUOROETH EX AERO: 1.0000 "application " | INHALATION_SPRAY | CUTANEOUS | Status: DC | PRN

## 2019-02-23 MED ORDER — POLYETHYLENE GLYCOL 3350 17 G PO PACK
17.0000 g | PACK | Freq: Every day | ORAL | Status: DC | PRN
  Administered 2019-02-23 – 2019-02-24 (×2): 17 g via ORAL
  Filled 2019-02-23: qty 1

## 2019-02-23 MED ORDER — SODIUM CHLORIDE 0.9 % IV SOLN: 100.0000 mL | INTRAVENOUS | Status: DC | PRN

## 2019-02-23 MED ORDER — HEPARIN SODIUM (PORCINE) 1000 UNIT/ML DIALYSIS: 20.0000 [IU]/kg | INTRAMUSCULAR | Status: DC | PRN

## 2019-02-23 MED ORDER — ALTEPLASE 2 MG IJ SOLR: 2.0000 mg | Freq: Once | INTRAMUSCULAR | Status: DC | PRN

## 2019-02-23 MED ORDER — HEPARIN SODIUM (PORCINE) 1000 UNIT/ML DIALYSIS: 1000.0000 [IU] | INTRAMUSCULAR | Status: DC | PRN

## 2019-02-23 MED ORDER — LIDOCAINE-PRILOCAINE 2.5-2.5 % EX CREA: 1.0000 "application " | TOPICAL_CREAM | CUTANEOUS | Status: DC | PRN

## 2019-02-23 NOTE — Care Management Important Message (Signed)
Important Message  Patient Details  Name: Jerry Cantrell MRN: [id number] Date of Birth: April 23, 1966   Medicare Important Message Given:  Yes     Orbie Pyo 02/23/2019, 4:27 PM

## 2019-02-23 NOTE — Progress Notes (Signed)
[facility] Progress Note  Name: Jerry Cantrell MRN: [id number] DOB: 1966-03-10  Subjective:  S/p HD on 6/24 with 1 kg UF.  Says HD went well yesterday.  He has a spot at [facility] for HD on MWF at 12:35 pm and would need to arrive an hour early on the first day for paperwork - discussed with patient.  We discussed that we will go ahead with short HD treatment here today with his oxygen requirement and he's ok with this.  Was on 4 liters this AM and was down to 2 liters as of my exam.    Review of systems:  Shortness of breath improving  No n/v No chest pain    Intake/Output Summary (Last 24 hours) at 02/23/2019 1118 Last data filed at 02/23/2019 0859 Gross per 24 hour  Intake 630 ml  Output 3380 ml  Net -2750 ml    Vitals:  Vitals:   02/23/19 0110 02/23/19 0135 02/23/19 0826 02/23/19 0829  BP:  (!) 156/88 140/72   Pulse: 61 60 (!) 55   Resp:  12    Temp:  98.3 F (36.8 C) 98.2 F (36.8 C)   TempSrc:  Oral Oral   SpO2:  100% 97% 95%  Weight:  130.1 kg    Height:         Physical Exam:  General adult male in bed in no acute distress HEENT normocephalic atraumatic extraocular movements intact sclera anicteric Neck supple trachea midline Lungs clear to auscultation bilaterally normal work of breathing at rest  Heart bradycardia no rub  Abdomen soft nontender nondistended Extremities 1+ edema  Psych normal mood and affect Access LUE AVF with bruit and thrill    Medications reviewed   Labs:  BMP Latest Ref Rng & Units 02/22/2019 02/21/2019 02/20/2019  Glucose 70 - 99 mg/dL 138(H) 48(L) -  BUN 6 - 20 mg/dL 79(H) 73(H) -  Creatinine 0.61 - 1.24 mg/dL 9.77(H) 9.22(H) 9.24(H)  BUN/Creat Ratio 9 - 20 - - -  Sodium 135 - 145 mmol/L 141 143 -  Potassium 3.5 - 5.1 mmol/L 4.4 4.0 -  Chloride 98 - 111 mmol/L 103 107 -  CO2 22 - 32 mmol/L 25 24 -  Calcium 8.9 - 10.3 mg/dL 8.8(L) 8.7(L) -     Assessment/Plan:   # 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  Plan for first treatment there on 6/26 likely  - HD today as still with oxygen requirement.   - Has LUE AVF which has been used for HD  # Acute hypoxic resp failure  - weaning oxygen requirement.  - HD today to help wean him off of oxygen   # Acute on chronic diastolic CHF  - EF 62-69% per TTE 2017 - optimize volume with HD ; feel pt likely somewhat diuretic resistant.   - Would resume lasix at 80 mg daily on non-HD days   # Hypertension with CKD - Monitor on current regimen.  Note reduction of coreg to allow for UF with HD  # Anemia secondary in part to CKD - Stable   # Secondary hyperparathyroidism - Noted patient on Sensipar 60 mg daily and calcitriol 0.25 mcg daily at home - Continue home regimen for now.   - Will receive calcitriol at outpatient HD unit once he is discharged   #  Diabetes mellitus per primary team   Dispo - he has a spot for MWF HD and is aware of date and time.  From a renal standpoint, he is able to go home after HD today as long as he is off of the oxygen.  Spoke with nursing and she is trying to wean. 2 liters at this time.  Paged primary team.   Claudia Desanctis, MD 02/23/2019 11:18 AM

## 2019-02-23 NOTE — Progress Notes (Signed)
PROGRESS NOTE    Jerry Cantrell  NFA:[id number] DOB: 1966-05-18 DOA: 02/20/2019 PCP: Forrest Moron, MD   Brief Narrative: Jerry Cantrell is a 53 y.o. male with CKD stage V, hypertension, chronic pain. He presented secondary to shortness of breath secondary to volume overload secondary to ESRD. Started on Lasix and will start HD.   Assessment & Plan:   Principal Problem:   Volume overload Active Problems:   Insulin dependent type 2 diabetes mellitus, uncontrolled (HCC)   Hyperlipemia   Hypertension   Chronic low back pain   Diabetic peripheral neuropathy associated with type 2 diabetes mellitus (HCC)   Chronic kidney disease, stage IV (severe) (HCC)   Chronic pain syndrome   Acute respiratory failure with hypoxia Acute pulmonary edema Secondary to fluid overload secondary to ESRD. Still requiring oxygen -Continue oxygen and wean to room air as able.  CKD stage V now ESRD Nephrology on board. Started on Lasix which is now discontinued. Starting IHD. CLIP completed. -HD per nephrology  Essential hypertension -Continue Coreg, hydralazine, verapamil  Diabetes mellitus, type 2 Last hemoglobin A1C of 6.4% -Continue Lantus and SSI  Macroglossia Appears to be chronic. No current concerns for angioedema.  Chronic pain -Continue oxycodone, Lyrica (renally dosed)  Anemia of chronic renal failure Hemoglobin stable.  Thrombocytopenia Stable currently. No prior history. No active bleeding. Outpatient follow-up unless acute issues arise.  Tobacco use -Continue nicotine patch  Murmur Appears to be new. Likely secondary to fistula. Last Transthoracic Echocardiogram from 2017 unremarkable for valvular disease contributing to a murmur. Patient can follow-up as an outpatient with his cardiologist for follow-up.   DVT prophylaxis: Heparin subq Code Status:   Code Status: Full Code Family Communication: None Disposition Plan: Discharge pending outpatient dialysis availability   Consultants:   Nephrology  Procedures:   HD  Antimicrobials:  None    Subjective: Shortness of breath  Objective: Vitals:   02/23/19 0110 02/23/19 0135 02/23/19 0826 02/23/19 0829  BP:  (!) 156/88 140/72   Pulse: 61 60 (!) 55   Resp:  12    Temp:  98.3 F (36.8 C) 98.2 F (36.8 C)   TempSrc:  Oral Oral   SpO2:  100% 97% 95%  Weight:  130.1 kg    Height:        Intake/Output Summary (Last 24 hours) at 02/23/2019 0848 Last data filed at 02/23/2019 0827 Gross per 24 hour  Intake 900 ml  Output 3580 ml  Net -2680 ml   Filed Weights   02/22/19 1150 02/22/19 1526 02/23/19 0135  Weight: 129.2 kg 128 kg 130.1 kg    Examination:  General exam: Appears calm and comfortable Respiratory system: Rales bilaterally. Respiratory effort normal. Cardiovascular system: S1 & S2 heard, RRR. 2/6 systolic murmur Gastrointestinal system: Abdomen is nondistended, soft and nontender. No organomegaly or masses felt. Normal bowel sounds heard. Central nervous system: Alert and oriented. No focal neurological deficits. Extremities: No edema. No calf tenderness Skin: No cyanosis. No rashes Psychiatry: Judgement and insight appear normal. Mood & affect appropriate.     Data Reviewed: I have personally reviewed following labs and imaging studies  CBC: Recent Labs  Lab 02/20/19 1453 02/20/19 2307 02/21/19 0513 02/22/19 0629  WBC 5.7 6.0 4.0 4.6  NEUTROABS 4.0  --   --   --   HGB 10.1* 10.1* 10.0* 10.5*  HCT 33.3* 32.3* 32.7* 34.2*  MCV 97.9 95.0 95.9 96.3  PLT 109* 104* 109* 469*   Basic Metabolic Panel: Recent Labs  Lab 02/20/19 1453 02/20/19 2307 02/21/19 0513 02/22/19 0629  NA 144  --  143 141  K 4.7  --  4.0 4.4  CL 107  --  107 103  CO2 25  --  24 25  GLUCOSE 58*  --  48* 138*  BUN 76*  --  73* 79*  CREATININE 8.88* 9.24* 9.22* 9.77*  CALCIUM 8.4*  --  8.7* 8.8*  PHOS  --   --   --  9.4*   GFR: Estimated Creatinine Clearance: 12.2 mL/min (A) (by C-G  formula based on SCr of 9.77 mg/dL (H)). Liver Function Tests: Recent Labs  Lab 02/20/19 1453  AST 42*  ALT 41  ALKPHOS 91  BILITOT 0.4  PROT 6.7  ALBUMIN 3.3*   No results for input(s): LIPASE, AMYLASE in the last 168 hours. No results for input(s): AMMONIA in the last 168 hours. Coagulation Profile: No results for input(s): INR, PROTIME in the last 168 hours. Cardiac Enzymes: Recent Labs  Lab 02/20/19 1453 02/20/19 2307 02/21/19 0513  TROPONINI 0.03* <0.03 <0.03   BNP (last 3 results) No results for input(s): PROBNP in the last 8760 hours. HbA1C: No results for input(s): HGBA1C in the last 72 hours. CBG: Recent Labs  Lab 02/22/19 0648 02/22/19 1129 02/22/19 1631 02/22/19 2104 02/23/19 0701  GLUCAP 142* 357* 335* 305* 158*   Lipid Profile: No results for input(s): CHOL, HDL, LDLCALC, TRIG, CHOLHDL, LDLDIRECT in the last 72 hours. Thyroid Function Tests: No results for input(s): TSH, T4TOTAL, FREET4, T3FREE, THYROIDAB in the last 72 hours. Anemia Panel: No results for input(s): VITAMINB12, FOLATE, FERRITIN, TIBC, IRON, RETICCTPCT in the last 72 hours. Sepsis Labs: Recent Labs  Lab 02/20/19 1600  LATICACIDVEN 1.0    Recent Results (from the past 240 hour(s))  SARS Coronavirus 2 (CEPHEID- Performed in [facility] hospital lab), Hosp Order     Status: None   Collection Time: 02/20/19  2:54 PM   Specimen: Nasopharyngeal Swab  Result Value Ref Range Status   SARS Coronavirus 2 NEGATIVE NEGATIVE Final    Comment: (NOTE) If result is NEGATIVE SARS-CoV-2 target nucleic acids are NOT DETECTED. The SARS-CoV-2 RNA is generally detectable in upper and lower  respiratory specimens during the acute phase of infection. The lowest  concentration of SARS-CoV-2 viral copies this assay can detect is 250  copies / mL. A negative result does not preclude SARS-CoV-2 infection  and should not be used as the sole basis for treatment or other  patient management decisions.  A  negative result may occur with  improper specimen collection / handling, submission of specimen other  than nasopharyngeal swab, presence of viral mutation(s) within the  areas targeted by this assay, and inadequate number of viral copies  (<250 copies / mL). A negative result must be combined with clinical  observations, patient history, and epidemiological information. If result is POSITIVE SARS-CoV-2 target nucleic acids are DETECTED. The SARS-CoV-2 RNA is generally detectable in upper and lower  respiratory specimens dur ing the acute phase of infection.  Positive  results are indicative of active infection with SARS-CoV-2.  Clinical  correlation with patient history and other diagnostic information is  necessary to determine patient infection status.  Positive results do  not rule out bacterial infection or co-infection with other viruses. If result is PRESUMPTIVE POSTIVE SARS-CoV-2 nucleic acids MAY BE PRESENT.   A presumptive positive result was obtained on the submitted specimen  and confirmed on repeat testing.  While 2019 novel coronavirus  (  SARS-CoV-2) nucleic acids may be present in the submitted sample  additional confirmatory testing may be necessary for epidemiological  and / or clinical management purposes  to differentiate between  SARS-CoV-2 and other Sarbecovirus currently known to infect humans.  If clinically indicated additional testing with an alternate test  methodology [phone]) is advised. The SARS-CoV-2 RNA is generally  detectable in upper and lower respiratory sp ecimens during the acute  phase of infection. The expected result is Negative. Fact Sheet for Patients:  StrictlyIdeas.no Fact Sheet for Healthcare Providers: BankingDealers.co.za This test is not yet approved or cleared by the [place] FDA and has been authorized for detection and/or diagnosis of SARS-CoV-2 by FDA under an Emergency Use  Authorization (EUA).  This EUA will remain in effect (meaning this test can be used) for the duration of the COVID-19 declaration under Section 564(b)(1) of the Act, 21 U.S.C. section 360bbb-3(b)(1), unless the authorization is terminated or revoked sooner. Performed at [facility], [place] [address], [place], [place] [address]          Radiology Studies: No results found.      Scheduled Meds: . atorvastatin  80 mg Oral q1800  . calcitRIOL  0.25 mcg Oral Daily  . carvedilol  12.5 mg Oral BID WC  . Chlorhexidine Gluconate Cloth  6 each Topical Q0600  . Chlorhexidine Gluconate Cloth  6 each Topical Q0600  . cinacalcet  60 mg Oral Q supper  . heparin  5,000 Units Subcutaneous Q8H  . hydrALAZINE  100 mg Oral TID  . insulin aspart  0-9 Units Subcutaneous TID WC  . insulin glargine  12 Units Subcutaneous Daily  . multivitamin  1 tablet Oral QHS  . nicotine  21 mg Transdermal Daily  . oxyCODONE  10 mg Oral Q8H  . pregabalin  25 mg Oral BID  . sodium chloride flush  3 mL Intravenous Q12H  . sodium chloride flush  3 mL Intravenous Q12H  . verapamil  360 mg Oral QHS   Continuous Infusions: . sodium chloride       LOS: 3 days     Cordelia Poche, MD [facility] 02/23/2019, 8:48 AM  If 7PM-7AM, please contact night-coverage [email or web address]

## 2019-02-23 NOTE — Progress Notes (Signed)
SATURATION QUALIFICATIONS: (This note is used to comply with regulatory documentation for home oxygen)  Patient Saturations on Room Air at Rest = 95%  Patient Saturations on Room Air while Ambulating = 87%  Patient Saturations on 1 Liters of oxygen while Ambulating = 97%

## 2019-02-23 NOTE — Plan of Care (Signed)
  Problem: Clinical Measurements: Goal: Ability to maintain clinical measurements within normal limits will improve Outcome: Progressing Goal: Respiratory complications will improve Outcome: Progressing   Problem: Education: Goal: Knowledge of General Education information will improve Description: Including pain rating scale, medication(s)/side effects and non-pharmacologic comfort measures Outcome: Completed/Met   Problem: Health Behavior/Discharge Planning: Goal: Ability to manage health-related needs will improve Outcome: Completed/Met   Problem: Clinical Measurements: Goal: Will remain free from infection Outcome: Completed/Met Goal: Diagnostic test results will improve Outcome: Completed/Met Goal: Cardiovascular complication will be avoided Outcome: Completed/Met   Problem: Activity: Goal: Risk for activity intolerance will decrease Outcome: Completed/Met   Problem: Nutrition: Goal: Adequate nutrition will be maintained Outcome: Completed/Met   Problem: Pain Managment: Goal: General experience of comfort will improve Outcome: Completed/Met   Problem: Safety: Goal: Ability to remain free from injury will improve Outcome: Completed/Met   Problem: Skin Integrity: Goal: Risk for impaired skin integrity will decrease Outcome: Completed/Met

## 2019-02-23 NOTE — Care Management Important Message (Signed)
Important Message  Patient Details  Name: Jerry Cantrell MRN: [id number] Date of Birth: 1966/02/03   Medicare Important Message Given:  Yes     Orbie Pyo 02/23/2019, 4:24 PM

## 2019-02-24 ENCOUNTER — Inpatient Hospital Stay (HOSPITAL_COMMUNITY): Payer: Medicare Other

## 2019-02-24 DIAGNOSIS — N186 End stage renal disease: Secondary | ICD-10-CM

## 2019-02-24 LAB — GLUCOSE, CAPILLARY
Glucose-Capillary: 255 mg/dL — ABNORMAL HIGH (ref 70–99)
Glucose-Capillary: 333 mg/dL — ABNORMAL HIGH (ref 70–99)
Glucose-Capillary: 228 mg/dL — ABNORMAL HIGH (ref 70–99)

## 2019-02-24 MED ORDER — FUROSEMIDE 80 MG PO TABS: 80.0000 mg | ORAL_TABLET | ORAL | Status: DC

## 2019-02-24 MED ORDER — METOLAZONE 5 MG PO TABS
5.0000 mg | ORAL_TABLET | Freq: Once | ORAL | Status: AC
  Administered 2019-02-24: 5 mg via ORAL
  Filled 2019-02-24: qty 1

## 2019-02-24 MED ORDER — FUROSEMIDE 10 MG/ML IJ SOLN
80.0000 mg | Freq: Once | INTRAMUSCULAR | Status: AC
  Administered 2019-02-24: 80 mg via INTRAVENOUS
  Filled 2019-02-24: qty 8

## 2019-02-24 MED ORDER — CHLORHEXIDINE GLUCONATE CLOTH 2 % EX PADS: 6.0000 | MEDICATED_PAD | Freq: Every day | CUTANEOUS | Status: DC

## 2019-02-24 MED ORDER — OXYCODONE HCL 5 MG PO TABS
ORAL_TABLET | ORAL | Status: AC
  Administered 2019-02-24: 10 mg via ORAL
  Filled 2019-02-24: qty 2

## 2019-02-24 MED ORDER — PREGABALIN 25 MG PO CAPS: 25.0000 mg | ORAL_CAPSULE | Freq: Two times a day (BID) | ORAL | 0 refills | Status: DC

## 2019-02-24 MED ORDER — CARVEDILOL 25 MG PO TABS: 12.5000 mg | ORAL_TABLET | Freq: Two times a day (BID) | ORAL | Status: DC

## 2019-02-24 MED ORDER — FUROSEMIDE 80 MG PO TABS: 80.0000 mg | ORAL_TABLET | ORAL | 0 refills | Status: DC

## 2019-02-24 MED ORDER — DARBEPOETIN ALFA 60 MCG/0.3ML IJ SOSY
60.0000 ug | PREFILLED_SYRINGE | Freq: Once | INTRAMUSCULAR | Status: AC
  Administered 2019-02-24: 60 ug via INTRAVENOUS
  Filled 2019-02-24: qty 0.3

## 2019-02-24 MED ORDER — DARBEPOETIN ALFA 60 MCG/0.3ML IJ SOSY
PREFILLED_SYRINGE | INTRAMUSCULAR | Status: AC
  Administered 2019-02-24: 60 ug via INTRAVENOUS
  Filled 2019-02-24: qty 0.3

## 2019-02-24 NOTE — Progress Notes (Signed)
SATURATION QUALIFICATIONS: (This note is used to comply with regulatory documentation for home oxygen)  Patient Saturations on Room Air at Rest = 95%  Patient Saturations on Room Air while Ambulating = 94%-99%

## 2019-02-24 NOTE — Discharge Instructions (Signed)
Jerry Cantrell,  You were in the hospital because of trouble breathing and found to have fluid in your lungs. You required hemodialysis and will need to continue this on discharge. Please follow-up with your kidney and primary doctors.

## 2019-02-24 NOTE — Progress Notes (Signed)
Inpatient Diabetes Program Recommendations  AACE/ADA: New Consensus Statement on Inpatient Glycemic Control (2015)  Target Ranges:  Prepandial:   less than 140 mg/dL      Peak postprandial:   less than 180 mg/dL (1-2 hours)      Critically ill patients:  140 - 180 mg/dL   Lab Results  Component Value Date   GLUCAP 255 (H) 02/24/2019   HGBA1C 6.4 (A) 12/09/2018    Review of Glycemic Control Results for Jerry Cantrell, Jerry Cantrell (MRN [id number]) as of 02/24/2019 10:03  Ref. Range 02/23/2019 11:38 02/23/2019 15:50 02/23/2019 22:09 02/24/2019 07:03  Glucose-Capillary Latest Ref Range: 70 - 99 mg/dL 248 (H) 143 (H) 225 (H) 255 (H)   Diabetes history: Type 2 DM Outpatient Diabetes medications: Lantus 30 units QAM, 15 units QPM, Humalog 8-10 units TID Current orders for Inpatient glycemic control: Lantus 12 units QD, Novolog 0-9 units TID  Inpatient Diabetes Program Recommendations:    If to remain inpatient, consider adding Novolog 5 units TID (assuming patient is consuming >50% of meals).   Thanks, Bronson Curb, MSN, RNC-OB Diabetes Coordinator [phone] (8a-5p)

## 2019-02-24 NOTE — Progress Notes (Signed)
DISCHARGE NOTE HOME Jerry Cantrell to be discharged Home per MD order. Discussed prescriptions and follow up appointments with the patient. Prescriptions given to patient; medication list explained in detail. Patient verbalized understanding.  Skin clean, dry and intact without evidence of skin break down, no evidence of skin tears noted. IV catheter discontinued intact. Site without signs and symptoms of complications. Dressing and pressure applied. Pt denies pain at the site currently. No complaints noted.  Patient free of lines, drains, and wounds.   An After Visit Summary (AVS) was printed and given to the patient. Patient escorted via wheelchair, and discharged home via private auto.  Orville Govern, RN

## 2019-02-24 NOTE — Progress Notes (Signed)
PRN given per pt's request. No obvious respiratory distress noted at this time. Pt resting comfortably on RA, with mostly clear BBS. RT will continue to monitor.

## 2019-02-24 NOTE — Discharge Summary (Signed)
Physician Discharge Summary  Jerry Cantrell NOP:[id number] DOB: 01-25-1966 DOA: 02/20/2019  PCP: Jerry Moron, MD  Admit date: 02/20/2019 Discharge date: 02/24/2019  Admitted From: Home Disposition: Home  Recommendations for Outpatient Follow-up:  1. Follow up with PCP in 1 week 2. Outpatient HD 3. Please obtain BMP/CBC in one week 4. Please follow up on the following pending results: None  Home Health: None Equipment/Devices: None  Discharge Condition: Stable CODE STATUS: Full code Diet recommendation: Renal/carb modified   Brief/Interim Summary:  Admission HPI written by [name], DO   Chief Complaint: Volume overload, shortness of breath   HPI: Jerry Cantrell is a 53 y.o. 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 Justin Mend, had a left upper extremity fistula placed about 2 years ago but had not initiated dialysis yet.  He states that he started feeling worsening shortness of breath starting Friday.  He was out of town visiting his aunt in [place].  He also noticed worsening swelling of his peripheral extremities as well as his abdomen.  He has been taking Lasix and has been making urine but not enough.  He denies any fevers or close contacts with COVID-19.  He admits to right-sided chest pain as well as a dry cough.  No nausea, vomiting or abdominal pain.  ED Course: Labs obtained creatinine 8.88, BNP 457, troponin 0 0.03.  Case discussed with nephrology and they will see him in consult at [facility] for likely initiation of dialysis.   Hospital course:  Acute respiratory failure with hypoxia Acute pulmonary edema Secondary to fluid overload secondary to ESRD. Weaned to room air and ambulated without oxygen need.  CKD stage V now ESRD Nephrology on board. Started on HD. Will continue HD on discharge. Lasix ordered for non-dialysis days.   Essential hypertension Continue Coreg, hydralazine, verapamil   Diabetes mellitus, type 2 Last hemoglobin A1C of 6.4%. Continue Lantus and SSI  Macroglossia Appears to be chronic. No current concerns for angioedema.  Chronic pain Continue oxycodone, Lyrica (renally dosed)  Anemia of chronic renal failure Hemoglobin stable.  Thrombocytopenia Stable currently. No prior history. No active bleeding. Outpatient follow-up unless acute issues arise.  Tobacco use Continue nicotine patch  Murmur Appears to be new. Likely secondary to fistula. Last Transthoracic Echocardiogram from 2017 unremarkable for valvular disease contributing to a murmur. Patient can follow-up as an outpatient with his cardiologist for follow-up.  Discharge Diagnoses:  Principal Problem:   Volume overload Active Problems:   Insulin dependent type 2 diabetes mellitus, uncontrolled (HCC)   Hyperlipemia   Hypertension   Chronic low back pain   Diabetic peripheral neuropathy associated with type 2 diabetes mellitus (HCC)   Chronic kidney disease, stage IV (severe) (HCC)   Chronic pain syndrome   ESRD (end stage renal disease) [facility])    Discharge Instructions  Discharge Instructions    Call MD for:  difficulty breathing, headache or visual disturbances   Complete by: As directed    Call MD for:  extreme fatigue   Complete by: As directed    Call MD for:  persistant dizziness or light-headedness   Complete by: As directed    Call MD for:  persistant nausea and vomiting   Complete by: As directed    Diet - low sodium heart healthy   Complete by: As directed    Increase activity slowly   Complete by: As directed      Allergies as of 02/24/2019  No Known Allergies     Medication List    STOP taking these medications   clobetasol cream 0.05 % Commonly known as: TEMOVATE   glycerin adult 2 g suppository   metolazone 5 MG tablet Commonly known as: ZAROXOLYN   oxycodone-acetaminophen 10-300 MG tablet Commonly known as: LYNOX   phenylephrine 0.25 %  suppository Commonly known as: (USE for PREPARATION-H)   Potassium Chloride ER 20 MEQ Tbcr     TAKE these medications   Accu-Chek Softclix Lancets lancets Use as instructed   atorvastatin 80 MG tablet Commonly known as: LIPITOR Take 1 tablet (80 mg total) by mouth daily at 6 PM.   calcitRIOL 0.25 MCG capsule Commonly known as: ROCALTROL Take 0.25 mcg by mouth daily.   carvedilol 25 MG tablet Commonly known as: COREG Take 0.5 tablets (12.5 mg total) by mouth 2 (two) times daily. What changed: how much to take   cetirizine 10 MG tablet Commonly known as: ZYRTEC TAKE ONE TABLET BY MOUTH ONCE DAILY FOR ALLERGIES   cinacalcet 60 MG tablet Commonly known as: SENSIPAR Take 60 mg by mouth daily.   ferrous sulfate 325 (65 FE) MG tablet Take 1 tablet (325 mg total) by mouth daily.   fluticasone 50 MCG/ACT nasal spray Commonly known as: FLONASE USE TWO SPRAYS INTO EACH NOSTRILS DAILY   furosemide 80 MG tablet Commonly known as: LASIX Take 1 tablet (80 mg total) by mouth every Tuesday, Thursday, Saturday, and Sunday. Start taking on: February 25, 2019 What changed:   how much to take  how to take this  when to take this  additional instructions   GaviLAX 17 GM/SCOOP powder Generic drug: polyethylene glycol powder TAKE ONE CAPFUL (17 GRAMS) BY MOUTH 2 (TWO) TIMES DAILY AS NEEDED. What changed: See the new instructions.   glucose blood test strip Commonly known as: Accu-Chek Aviva Plus 1 each by Other route 5 (five) times daily. Use as instructed   hydrALAZINE 100 MG tablet Commonly known as: APRESOLINE Take 1 tablet (100 mg total) by mouth 3 (three) times daily.   Insulin Glargine 100 UNIT/ML Solostar Pen Commonly known as: Lantus SoloStar Inject 45 Units into the skin daily. INJECT 30 UNITS UNDER THE SKIN EVERY MORNING AND INJECT 15 UNITS EVERY NIGHT What changed: additional instructions   insulin lispro 100 UNIT/ML KwikPen Commonly known as: HumaLOG KwikPen  Inject 0.08 mLs (8 Units total) into the skin 3 (three) times daily. Max daily dose of 40 units daily What changed: how much to take   Insulin Pen Needle 31G X 5 MM Misc Commonly known as: B-D UF III MINI PEN NEEDLES Four times daily   multivitamin-iron-minerals-folic acid chewable tablet Chew 1 tablet by mouth daily.   Narcan 4 MG/0.1ML Liqd nasal spray kit Generic drug: naloxone Place 2 mLs into the nose See admin instructions. May repeat dose every 2 to 3 minutes until responsive or ems arrives.   Nicotine 21-14-7 MG/24HR Kit Place 1 patch onto the skin daily.   Olopatadine HCl 0.2 % Soln Apply 1 drop to eye daily. What changed: how to take this   Oxycodone HCl 10 MG Tabs Take 10 mg by mouth every 8 (eight) hours.   pregabalin 25 MG capsule Commonly known as: LYRICA Take 1 capsule (25 mg total) by mouth 2 (two) times daily. What changed:   medication strength  how much to take  when to take this   verapamil 180 MG 24 hr capsule Commonly known as: VERELAN PM Take 1  capsule (180 mg total) by mouth at bedtime. What changed: how much to take      Follow-up Information    Jerry Moron, MD. Schedule an appointment as soon as possible for a visit in 1 week(s).   Specialty: Internal Medicine Contact information: [place] [address] [phone]          No Known Allergies  Consultations:  Nephrology   Procedures/Studies: Dg Chest 2 View  Result Date: 02/24/2019 CLINICAL DATA:  Shortness of breath. EXAM: CHEST - 2 VIEW COMPARISON:  02/20/2019. FINDINGS: Cardiomegaly with mild pulmonary vascular prominence and bilateral interstitial prominence. Findings suggest mild CHF. No pleural effusion or pneumothorax. Degenerative change thoracic spine. IMPRESSION: Cardiomegaly with mild pulmonary vascular prominence and bilateral interstitial prominence. Findings suggest mild CHF. Electronically Signed   By: Marcello Moores  Register   On: 02/24/2019 10:47    Dg Chest Portable 1 View  Result Date: 02/20/2019 CLINICAL DATA:  Hypoglycemia with shortness of breath and lethargy EXAM: PORTABLE CHEST 1 VIEW COMPARISON:  Chest radiograph October 27, 2016 and chest CT January 31, 2018 FINDINGS: There is airspace opacity throughout the right base region. Lungs elsewhere are clear. There is cardiomegaly with mild pulmonary venous hypertension. No adenopathy. No bone lesions. IMPRESSION: Airspace opacity right base region consistent with pneumonia. Cardiomegaly with pulmonary vascular congestion present. No evident adenopathy. Electronically Signed   By: Lowella Grip III M.D.   On: 02/20/2019 15:45      Subjective: Shortness of breath overnight. Has some sputum production. Afebrile.  Discharge Exam: Vitals:   02/24/19 1500 02/24/19 1542  BP: (!) 142/87 (!) 155/89  Pulse: 63 63  Resp:  18  Temp:  97.6 F (36.4 C)  SpO2:  98%   Vitals:   02/24/19 1400 02/24/19 1430 02/24/19 1500 02/24/19 1542  BP: (!) 157/85 (!) 160/91 (!) 142/87 (!) 155/89  Pulse: 63 63 63 63  Resp:    18  Temp:    97.6 F (36.4 C)  TempSrc:    Oral  SpO2:    98%  Weight:    121.1 kg  Height:        General: Pt is alert, awake, not in acute distress Cardiovascular: RRR, S1/S2 +, no rubs, no gallops Respiratory: Rales at baes, no wheezing, no rhonchi Abdominal: Soft, NT, ND, bowel sounds + Extremities: LE edema, no cyanosis    The results of significant diagnostics from this hospitalization (including imaging, microbiology, ancillary and laboratory) are listed below for reference.     Microbiology: Recent Results (from the past 240 hour(s))  SARS Coronavirus 2 (CEPHEID- Performed in [place] hospital lab), Hosp Order     Status: None   Collection Time: 02/20/19  2:54 PM   Specimen: Nasopharyngeal Swab  Result Value Ref Range Status   SARS Coronavirus 2 NEGATIVE NEGATIVE Final    Comment: (NOTE) If result is NEGATIVE SARS-CoV-2 target nucleic acids are NOT  DETECTED. The SARS-CoV-2 RNA is generally detectable in upper and lower  respiratory specimens during the acute phase of infection. The lowest  concentration of SARS-CoV-2 viral copies this assay can detect is 250  copies / mL. A negative result does not preclude SARS-CoV-2 infection  and should not be used as the sole basis for treatment or other  patient management decisions.  A negative result may occur with  improper specimen collection / handling, submission of specimen other  than nasopharyngeal swab, presence of viral mutation(s) within the  areas targeted by this assay,  and inadequate number of viral copies  (<250 copies / mL). A negative result must be combined with clinical  observations, patient history, and epidemiological information. If result is POSITIVE SARS-CoV-2 target nucleic acids are DETECTED. The SARS-CoV-2 RNA is generally detectable in upper and lower  respiratory specimens dur ing the acute phase of infection.  Positive  results are indicative of active infection with SARS-CoV-2.  Clinical  correlation with patient history and other diagnostic information is  necessary to determine patient infection status.  Positive results do  not rule out bacterial infection or co-infection with other viruses. If result is PRESUMPTIVE POSTIVE SARS-CoV-2 nucleic acids MAY BE PRESENT.   A presumptive positive result was obtained on the submitted specimen  and confirmed on repeat testing.  While 2019 novel coronavirus  (SARS-CoV-2) nucleic acids may be present in the submitted sample  additional confirmatory testing may be necessary for epidemiological  and / or clinical management purposes  to differentiate between  SARS-CoV-2 and other Sarbecovirus currently known to infect humans.  If clinically indicated additional testing with an alternate test  methodology [phone]) is advised. The SARS-CoV-2 RNA is generally  detectable in upper and lower respiratory sp ecimens during  the acute  phase of infection. The expected result is Negative. Fact Sheet for Patients:  StrictlyIdeas.no Fact Sheet for Healthcare Providers: BankingDealers.co.za This test is not yet approved or cleared by the [place] FDA and has been authorized for detection and/or diagnosis of SARS-CoV-2 by FDA under an Emergency Use Authorization (EUA).  This EUA will remain in effect (meaning this test can be used) for the duration of the COVID-19 declaration under Section 564(b)(1) of the Act, 21 U.S.C. section 360bbb-3(b)(1), unless the authorization is terminated or revoked sooner. Performed at [facility], [place] [address] [place], [place] [address]      Labs: BNP (last 3 results) Recent Labs    02/20/19 1454  BNP 443.1*   Basic Metabolic Panel: Recent Labs  Lab 02/20/19 1453 02/20/19 2307 02/21/19 0513 02/22/19 0629 02/23/19 1230  NA 144  --  143 141 138  K 4.7  --  4.0 4.4 3.9  CL 107  --  107 103 100  CO2 25  --  _0 GLUCOSE 58*  --  48* 138* 243*  BUN 76*  --  73* 79* 65*  CREATININE 8.88* 9.24* 9.22* 9.77* 8.30*  CALCIUM 8.4*  --  8.7* 8.8* 8.7*  PHOS  --   --   --  9.4* 7.6*   Liver Function Tests: Recent Labs  Lab 02/20/19 1453 02/23/19 1230  AST 42*  --   ALT 41  --   ALKPHOS 91  --   BILITOT 0.4  --   PROT 6.7  --   ALBUMIN 3.3* 2.6*   No results for input(s): LIPASE, AMYLASE in the last 168 hours. No results for input(s): AMMONIA in the last 168 hours. CBC: Recent Labs  Lab 02/20/19 1453 02/20/19 2307 02/21/19 0513 02/22/19 0629 02/23/19 1230  WBC 5.7 6.0 4.0 4.6 4.3  NEUTROABS 4.0  --   --   --   --   HGB 10.1* 10.1* 10.0* 10.5* 9.1*  HCT 33.3* 32.3* 32.7* 34.2* 29.8*  MCV 97.9 95.0 95.9 96.3 95.5  PLT 109* 104* 109* 124* 110*   Cardiac Enzymes: Recent Labs  Lab 02/20/19 1453 02/20/19 2307 02/21/19 0513  TROPONINI 0.03* <0.03 <0.03   BNP: Invalid input(s):  POCBNP CBG: Recent Labs  Lab  02/23/19 1138 02/23/19 1550 02/23/19 2209 02/24/19 0703 02/24/19 1128  GLUCAP 248* 143* 225* 255* 333*   D-Dimer No results for input(s): DDIMER in the last 72 hours. Hgb A1c No results for input(s): HGBA1C in the last 72 hours. Lipid Profile No results for input(s): CHOL, HDL, LDLCALC, TRIG, CHOLHDL, LDLDIRECT in the last 72 hours. Thyroid function studies No results for input(s): TSH, T4TOTAL, T3FREE, THYROIDAB in the last 72 hours.  Invalid input(s): FREET3 Anemia work up No results for input(s): VITAMINB12, FOLATE, FERRITIN, TIBC, IRON, RETICCTPCT in the last 72 hours. Urinalysis    Component Value Date/Time   COLORURINE YELLOW 10/13/2015 1659   APPEARANCEUR CLOUDY (A) 10/13/2015 1659   LABSPEC 1.015 12/22/2016 1210   PHURINE 5.5 12/22/2016 1210   GLUCOSEU 100 (A) 12/22/2016 1210   HGBUR TRACE (A) 12/22/2016 1210   BILIRUBINUR NEGATIVE 12/22/2016 1210   KETONESUR NEGATIVE 12/22/2016 1210   PROTEINUR >=300 (A) 12/22/2016 1210   UROBILINOGEN 0.2 12/22/2016 1210   NITRITE NEGATIVE 12/22/2016 1210   LEUKOCYTESUR TRACE (A) 12/22/2016 1210   Sepsis Labs Invalid input(s): PROCALCITONIN,  WBC,  LACTICIDVEN Microbiology Recent Results (from the past 240 hour(s))  SARS Coronavirus 2 (CEPHEID- Performed in [place] hospital lab), Hosp Order     Status: None   Collection Time: 02/20/19  2:54 PM   Specimen: Nasopharyngeal Swab  Result Value Ref Range Status   SARS Coronavirus 2 NEGATIVE NEGATIVE Final    Comment: (NOTE) If result is NEGATIVE SARS-CoV-2 target nucleic acids are NOT DETECTED. The SARS-CoV-2 RNA is generally detectable in upper and lower  respiratory specimens during the acute phase of infection. The lowest  concentration of SARS-CoV-2 viral copies this assay can detect is 250  copies / mL. A negative result does not preclude SARS-CoV-2 infection  and should not be used as the sole basis for treatment or other  patient  management decisions.  A negative result may occur with  improper specimen collection / handling, submission of specimen other  than nasopharyngeal swab, presence of viral mutation(s) within the  areas targeted by this assay, and inadequate number of viral copies  (<250 copies / mL). A negative result must be combined with clinical  observations, patient history, and epidemiological information. If result is POSITIVE SARS-CoV-2 target nucleic acids are DETECTED. The SARS-CoV-2 RNA is generally detectable in upper and lower  respiratory specimens dur ing the acute phase of infection.  Positive  results are indicative of active infection with SARS-CoV-2.  Clinical  correlation with patient history and other diagnostic information is  necessary to determine patient infection status.  Positive results do  not rule out bacterial infection or co-infection with other viruses. If result is PRESUMPTIVE POSTIVE SARS-CoV-2 nucleic acids MAY BE PRESENT.   A presumptive positive result was obtained on the submitted specimen  and confirmed on repeat testing.  While 2019 novel coronavirus  (SARS-CoV-2) nucleic acids may be present in the submitted sample  additional confirmatory testing may be necessary for epidemiological  and / or clinical management purposes  to differentiate between  SARS-CoV-2 and other Sarbecovirus currently known to infect humans.  If clinically indicated additional testing with an alternate test  methodology [phone]) is advised. The SARS-CoV-2 RNA is generally  detectable in upper and lower respiratory sp ecimens during the acute  phase of infection. The expected result is Negative. Fact Sheet for Patients:  StrictlyIdeas.no Fact Sheet for Healthcare Providers: BankingDealers.co.za This test is not yet approved or cleared by the [place]  FDA and has been authorized for detection and/or diagnosis of SARS-CoV-2 by FDA under  an Emergency Use Authorization (EUA).  This EUA will remain in effect (meaning this test can be used) for the duration of the COVID-19 declaration under Section 564(b)(1) of the Act, 21 U.S.C. section 360bbb-3(b)(1), unless the authorization is terminated or revoked sooner. Performed at [facility], [place] [address], [place], [place] [address]      Time coordinating discharge: Over 35 minutes  SIGNED:   Cordelia Poche, MD [facility] 02/24/2019, 4:41 PM

## 2019-02-24 NOTE — Progress Notes (Signed)
[facility] Progress Note  Name: Jerry Cantrell MRN: [id number] DOB: Sep 25, 1965  Subjective:  S/p HD on 6/25 with 2 kg UF.  He stayed yesterday because of shortness of breath and oxygen requirement.  Oxygen was able to be weaned to room air but still short of breath.  [place] with HD again today.   Review of systems:  Shortness of breath improving but still present No n/v No chest pain    Intake/Output Summary (Last 24 hours) at 02/24/2019 0847 Last data filed at 02/24/2019 0527 Gross per 24 hour  Intake 480 ml  Output 3050 ml  Net -2570 ml    Vitals:  Vitals:   02/23/19 1500 02/23/19 1505 02/23/19 2210 02/24/19 0527  BP: (!) 150/77 (!) 146/83 (!) 157/83 137/74  Pulse: (!) 59 64 61 64  Resp: 12 16 18 18   Temp: 98.7 F (37.1 C)  98.3 F (36.8 C) 98.4 F (36.9 C)  TempSrc: Oral  Oral Oral  SpO2: 99%  97% 96%  Weight: 128.5 kg  128.5 kg   Height:         Physical Exam:  General adult male in bed in no acute distress HEENT normocephalic atraumatic extraocular movements intact sclera anicteric Neck supple trachea midline Lungs clear to auscultation bilaterally normal work of breathing at rest  Heart RRR; no rub  Abdomen soft nontender nondistended Extremities 1-2+ edema  Psych normal mood and affect Access LUE AVF with bruit and thrill    Medications reviewed   Labs:  BMP Latest Ref Rng & Units 02/23/2019 02/22/2019 02/21/2019  Glucose 70 - 99 mg/dL 243(H) 138(H) 48(L)  BUN 6 - 20 mg/dL 65(H) 79(H) 73(H)  Creatinine 0.61 - 1.24 mg/dL 8.30(H) 9.77(H) 9.22(H)  BUN/Creat Ratio 9 - 20 - - -  Sodium 135 - 145 mmol/L 138 141 143  Potassium 3.5 - 5.1 mmol/L 3.9 4.4 4.0  Chloride 98 - 111 mmol/L 100 103 107  CO2 22 - 32 mmol/L 25 25 24   Calcium 8.9 - 10.3 mg/dL 8.7(L) 8.8(L) 8.7(L)     Assessment/Plan:   # CKD stage V with progression to end-stage renal disease - Started HD on 6/24. Has LUE AVF - HD today to optimize volume status  - He has a spot at [facility]  for HD on MWF at 12:35 pm and would need to arrive an hour early on the first day for paperwork.   # Acute hypoxic resp failure  - weaning oxygen requirement.  - HD today to optimize volume; he is off of oxygen and reports was taken off last night  # Acute on chronic diastolic CHF  - EF 93-73% per TTE 2017 - optimize volume with HD ; feel pt likely somewhat diuretic resistant.   - Metolazone and lasix once now as well  - Will plan for Lasix 80 mg daily on non-dialysis days on discharge  # Hypertension with CKD - Controlled.  Note reduction of coreg to allow for UF with HD  # Anemia secondary in part to CKD - Stable.  Aranesp 60 mcg once on 6/26  # Secondary hyperparathyroidism - Noted patient on Sensipar 60 mg daily and calcitriol 0.25 mcg daily at home - Continue home regimen for now.   - Discontinue calcitriol on discharge as he will receive calcitriol at outpatient HD unit once he is discharged.  Can continue sensipar on discharge.  Further titration outpatient    # Diabetes mellitus per primary team   Dispo - he has a  spot for MWF HD and is aware of date and time.  From a renal standpoint, he is able to go home after HD today as long as respiratory status stable.    Claudia Desanctis, MD 02/24/2019 8:47 AM

## 2019-02-27 DIAGNOSIS — D631 Anemia in chronic kidney disease: Secondary | ICD-10-CM | POA: Diagnosis not present

## 2019-02-27 DIAGNOSIS — N2581 Secondary hyperparathyroidism of renal origin: Secondary | ICD-10-CM | POA: Diagnosis not present

## 2019-02-27 DIAGNOSIS — D689 Coagulation defect, unspecified: Secondary | ICD-10-CM | POA: Diagnosis not present

## 2019-02-27 DIAGNOSIS — Z992 Dependence on renal dialysis: Secondary | ICD-10-CM | POA: Diagnosis not present

## 2019-02-27 DIAGNOSIS — N186 End stage renal disease: Secondary | ICD-10-CM | POA: Diagnosis not present

## 2019-02-28 ENCOUNTER — Encounter: Payer: Self-pay | Admitting: *Deleted

## 2019-02-28 ENCOUNTER — Other Ambulatory Visit: Payer: Self-pay | Admitting: *Deleted

## 2019-02-28 DIAGNOSIS — Z7689 Persons encountering health services in other specified circumstances: Secondary | ICD-10-CM | POA: Diagnosis not present

## 2019-02-28 LAB — BASIC METABOLIC PANEL: Creatinine: 8.5 — AB (ref <=1.3)

## 2019-02-28 NOTE — Patient Outreach (Addendum)
Referral received from [employer] high risk list, outreach call to pt for screening, pt was hospitalized 6/22-6/26/20 for acute respiratory failure, pt with history CHF, DM, legally blind, obesity, depression, chronic pain.  Primary MD office completes transition of care. Spoke with pt, HIPAA verified, pt states he is new to dialysis and just started last week and is on M, W, F schedule.  Pt states he had bloodwork done at dialysis and is to see cardiologist within next few weeks and podiatrist on 03/06/19 and is aware he is to make appointment for primary care MD.  Pt states CBG averages 117-200 and "this is probably my biggest problem, getting it to stay under 200" Pt reports he weighs daily and has been doing this for quite some time now, verbalizes action plan for HF although feels dialysis complicates this.  Pt sates he has a friend that assists him as needed, pt states he still drives and is independent with ADL's, IADL's.  Pt continues to smoke 1/2 ppd and has nicotine patches and states " I do plan to use these soon"  Pt has chronic back pain and attends pain management clinic, states he will not have surgery and will continue with current regime for pain. Pt reports he has all medications and taking as prescribed. Pt states he is very tired from hospitalization and dialysis and is going to take a nap, requests RN CM call back next week. RN CM faxed barrier letter and today's visit note to primary MD.  [facility] CM Care Plan Problem One     Most Recent Value  Care Plan Problem One  Knowledge deficit related to diabetes  Role Documenting the Problem One  Care Management [place] for Problem One  Active  [facility] Long Term Goal   Pt will demonstrate/ verbalize improved self care related to diabetes within 60 days aeb decrease Hgb AIC 1-2 points  [facility] Long Term Goal Start Date  02/28/19  Interventions for Problem One Long Term Goal  RN CM explained [facility] program, mailed successful outreach letter  to pt home with consent form, 24 hour nurse line magnet and pamphlet, reviewed plan of care and goals  [facility] CM Short Term Goal #1   Pt will verbalize plate method and practice portion control within 30 days  [facility] CM Short Term Goal #1 Start Date  02/28/19  Interventions for Short Term Goal #1  RN CM reviewed plate method, carbohyrate counting and portion control, ask pt to make small changes daily    [facility] CM Care Plan Problem Two     Most Recent Value  Care Plan Problem Two  End stage renal disease (new to dialysis)  Role Documenting the Problem Two  Care Management [place] for Problem Two  Active  [facility] CM Short Term Goal #1   Pt will verbalize interventions for positive health changes/ actions to facilitate successful dialysis/ goals within 30 days  [facility] CM Short Term Goal #1 Start Date  02/28/19  Interventions for Short Term Goal #2   RN CM reviewed renal diet/ fluid restrictions and importance of following, reviewed attending dialysis as ordered by MD.     PLAN Outreach pt next week by phone Complete remainder of required assessments Assess CBG, reinforce renal/ DM diet, plate method  Jacqlyn Larsen [facility], [place] [phone]

## 2019-03-01 ENCOUNTER — Encounter: Payer: Self-pay | Admitting: Family Medicine

## 2019-03-01 DIAGNOSIS — G890 Central pain syndrome: Secondary | ICD-10-CM | POA: Diagnosis not present

## 2019-03-01 DIAGNOSIS — Z992 Dependence on renal dialysis: Secondary | ICD-10-CM | POA: Diagnosis not present

## 2019-03-01 DIAGNOSIS — N2581 Secondary hyperparathyroidism of renal origin: Secondary | ICD-10-CM | POA: Diagnosis not present

## 2019-03-01 DIAGNOSIS — E1122 Type 2 diabetes mellitus with diabetic chronic kidney disease: Secondary | ICD-10-CM | POA: Diagnosis not present

## 2019-03-01 DIAGNOSIS — G8929 Other chronic pain: Secondary | ICD-10-CM | POA: Diagnosis not present

## 2019-03-01 DIAGNOSIS — Z79891 Long term (current) use of opiate analgesic: Secondary | ICD-10-CM | POA: Diagnosis not present

## 2019-03-01 DIAGNOSIS — M5432 Sciatica, left side: Secondary | ICD-10-CM | POA: Diagnosis not present

## 2019-03-01 DIAGNOSIS — D689 Coagulation defect, unspecified: Secondary | ICD-10-CM | POA: Diagnosis not present

## 2019-03-01 DIAGNOSIS — D631 Anemia in chronic kidney disease: Secondary | ICD-10-CM | POA: Diagnosis not present

## 2019-03-01 DIAGNOSIS — M5431 Sciatica, right side: Secondary | ICD-10-CM | POA: Diagnosis not present

## 2019-03-01 DIAGNOSIS — M545 Low back pain: Secondary | ICD-10-CM | POA: Diagnosis not present

## 2019-03-01 DIAGNOSIS — N186 End stage renal disease: Secondary | ICD-10-CM | POA: Diagnosis not present

## 2019-03-01 DIAGNOSIS — D509 Iron deficiency anemia, unspecified: Secondary | ICD-10-CM | POA: Diagnosis not present

## 2019-03-01 DIAGNOSIS — G894 Chronic pain syndrome: Secondary | ICD-10-CM | POA: Diagnosis not present

## 2019-03-01 DIAGNOSIS — Z23 Encounter for immunization: Secondary | ICD-10-CM | POA: Diagnosis not present

## 2019-03-03 ENCOUNTER — Encounter: Payer: Self-pay | Admitting: Nephrology

## 2019-03-03 DIAGNOSIS — N186 End stage renal disease: Secondary | ICD-10-CM | POA: Diagnosis not present

## 2019-03-03 DIAGNOSIS — Z23 Encounter for immunization: Secondary | ICD-10-CM | POA: Diagnosis not present

## 2019-03-03 DIAGNOSIS — D509 Iron deficiency anemia, unspecified: Secondary | ICD-10-CM | POA: Diagnosis not present

## 2019-03-03 DIAGNOSIS — E1122 Type 2 diabetes mellitus with diabetic chronic kidney disease: Secondary | ICD-10-CM | POA: Diagnosis not present

## 2019-03-03 DIAGNOSIS — Z992 Dependence on renal dialysis: Secondary | ICD-10-CM | POA: Diagnosis not present

## 2019-03-03 DIAGNOSIS — N2581 Secondary hyperparathyroidism of renal origin: Secondary | ICD-10-CM | POA: Diagnosis not present

## 2019-03-03 DIAGNOSIS — D631 Anemia in chronic kidney disease: Secondary | ICD-10-CM | POA: Diagnosis not present

## 2019-03-03 DIAGNOSIS — D689 Coagulation defect, unspecified: Secondary | ICD-10-CM | POA: Diagnosis not present

## 2019-03-06 ENCOUNTER — Other Ambulatory Visit: Payer: Self-pay

## 2019-03-06 ENCOUNTER — Ambulatory Visit (INDEPENDENT_AMBULATORY_CARE_PROVIDER_SITE_OTHER): Payer: Medicare Other | Admitting: Podiatry

## 2019-03-06 ENCOUNTER — Telehealth: Payer: Self-pay | Admitting: *Deleted

## 2019-03-06 ENCOUNTER — Encounter: Payer: Self-pay | Admitting: *Deleted

## 2019-03-06 ENCOUNTER — Other Ambulatory Visit: Payer: Self-pay | Admitting: *Deleted

## 2019-03-06 ENCOUNTER — Encounter: Payer: Self-pay | Admitting: Podiatry

## 2019-03-06 VITALS — Temp 97.6°F

## 2019-03-06 DIAGNOSIS — B351 Tinea unguium: Secondary | ICD-10-CM

## 2019-03-06 DIAGNOSIS — Z794 Long term (current) use of insulin: Secondary | ICD-10-CM

## 2019-03-06 DIAGNOSIS — L84 Corns and callosities: Secondary | ICD-10-CM | POA: Diagnosis not present

## 2019-03-06 DIAGNOSIS — N186 End stage renal disease: Secondary | ICD-10-CM | POA: Diagnosis not present

## 2019-03-06 DIAGNOSIS — Z992 Dependence on renal dialysis: Secondary | ICD-10-CM | POA: Diagnosis not present

## 2019-03-06 DIAGNOSIS — E1122 Type 2 diabetes mellitus with diabetic chronic kidney disease: Secondary | ICD-10-CM

## 2019-03-06 NOTE — Patient Instructions (Signed)
Diabetes Mellitus and Foot Care Foot care is an important part of your health, especially when you have diabetes. Diabetes may cause you to have problems because of poor blood flow (circulation) to your feet and legs, which can cause your skin to:  Become thinner and drier.  Break more easily.  Heal more slowly.  Peel and crack. You may also have nerve damage (neuropathy) in your legs and feet, causing decreased feeling in them. This means that you may not notice minor injuries to your feet that could lead to more serious problems. Noticing and addressing any potential problems early is the best way to prevent future foot problems. How to care for your feet Foot hygiene  Wash your feet daily with warm water and mild soap. Do not use hot water. Then, pat your feet and the areas between your toes until they are completely dry. Do not soak your feet as this can dry your skin.  Trim your toenails straight across. Do not dig under them or around the cuticle. File the edges of your nails with an emery board or nail file.  Apply a moisturizing lotion or petroleum jelly to the skin on your feet and to dry, brittle toenails. Use lotion that does not contain alcohol and is unscented. Do not apply lotion between your toes. Shoes and socks  Wear clean socks or stockings every day. Make sure they are not too tight. Do not wear knee-high stockings since they may decrease blood flow to your legs.  Wear shoes that fit properly and have enough cushioning. Always look in your shoes before you put them on to be sure there are no objects inside.  To break in new shoes, wear them for just a few hours a day. This prevents injuries on your feet. Wounds, scrapes, corns, and calluses  Check your feet daily for blisters, cuts, bruises, sores, and redness. If you cannot see the bottom of your feet, use a mirror or ask someone for help.  Do not cut corns or calluses or try to remove them with medicine.  If you  find a minor scrape, cut, or break in the skin on your feet, keep it and the skin around it clean and dry. You may clean these areas with mild soap and water. Do not clean the area with peroxide, alcohol, or iodine.  If you have a wound, scrape, corn, or callus on your foot, look at it several times a day to make sure it is healing and not infected. Check for: ? Redness, swelling, or pain. ? Fluid or blood. ? Warmth. ? Pus or a bad smell. General instructions  Do not cross your legs. This may decrease blood flow to your feet.  Do not use heating pads or hot water bottles on your feet. They may burn your skin. If you have lost feeling in your feet or legs, you may not know this is happening until it is too late.  Protect your feet from hot and cold by wearing shoes, such as at the beach or on hot pavement.  Schedule a complete foot exam at least once a year (annually) or more often if you have foot problems. If you have foot problems, report any cuts, sores, or bruises to your health care provider immediately. Contact a health care provider if:  You have a medical condition that increases your risk of infection and you have any cuts, sores, or bruises on your feet.  You have an injury that is not   healing.  You have redness on your legs or feet.  You feel burning or tingling in your legs or feet.  You have pain or cramps in your legs and feet.  Your legs or feet are numb.  Your feet always feel cold.  You have pain around a toenail. Get help right away if:  You have a wound, scrape, corn, or callus on your foot and: ? You have pain, swelling, or redness that gets worse. ? You have fluid or blood coming from the wound, scrape, corn, or callus. ? Your wound, scrape, corn, or callus feels warm to the touch. ? You have pus or a bad smell coming from the wound, scrape, corn, or callus. ? You have a fever. ? You have a red line going up your leg. Summary  Check your feet every day  for cuts, sores, red spots, swelling, and blisters.  Moisturize feet and legs daily.  Wear shoes that fit properly and have enough cushioning.  If you have foot problems, report any cuts, sores, or bruises to your health care provider immediately.  Schedule a complete foot exam at least once a year (annually) or more often if you have foot problems. This information is not intended to replace advice given to you by your health care provider. Make sure you discuss any questions you have with your health care provider. Document Released: 08/14/2000 Document Revised: 09/29/2017 Document Reviewed: 09/18/2016 Elsevier Patient Education  2020 [employer]   Onychomycosis/Fungal Toenails  WHAT IS IT? An infection that lies within the keratin of your nail plate that is caused by a fungus.  WHY ME? Fungal infections affect all ages, sexes, races, and creeds.  There may be many factors that predispose you to a fungal infection such as age, coexisting medical conditions such as diabetes, or an autoimmune disease; stress, medications, fatigue, genetics, etc.  Bottom line: fungus thrives in a warm, moist environment and your shoes offer such a location.  IS IT CONTAGIOUS? Theoretically, yes.  You do not want to share shoes, nail clippers or files with someone who has fungal toenails.  Walking around barefoot in the same room or sleeping in the same bed is unlikely to transfer the organism.  It is important to realize, however, that fungus can spread easily from one nail to the next on the same foot.  HOW DO WE TREAT THIS?  There are several ways to treat this condition.  Treatment may depend on many factors such as age, medications, pregnancy, liver and kidney conditions, etc.  It is best to ask your doctor which options are available to you.  1. No treatment.   Unlike many other medical concerns, you can live with this condition.  However for many people this can be a painful condition and may lead to  ingrown toenails or a bacterial infection.  It is recommended that you keep the nails cut short to help reduce the amount of fungal nail. 2. Topical treatment.  These range from herbal remedies to prescription strength nail lacquers.  About 40-50% effective, topicals require twice daily application for approximately 9 to 12 months or until an entirely new nail has grown out.  The most effective topicals are medical grade medications available through physicians offices. 3. Oral antifungal medications.  With an 80-90% cure rate, the most common oral medication requires 3 to 4 months of therapy and stays in your system for a year as the new nail grows out.  Oral antifungal medications do require   blood work to make sure it is a safe drug for you.  A liver function panel will be performed prior to starting the medication and after the first month of treatment.  It is important to have the blood work performed to avoid any harmful side effects.  In general, this medication safe but blood work is required. 4. Laser Therapy.  This treatment is performed by applying a specialized laser to the affected nail plate.  This therapy is noninvasive, fast, and non-painful.  It is not covered by insurance and is therefore, out of pocket.  The results have been very good with a 80-95% cure rate.  The [facility] is the only practice in the area to offer this therapy. 5. Permanent Nail Avulsion.  Removing the entire nail so that a new nail will not grow back. 

## 2019-03-06 NOTE — Progress Notes (Signed)
Cardiology Office Note:    Date:  03/07/2019   ID:  Jerry Cantrell, DOB [birth year], MRN [id number]  PCP:  Forrest Moron, MD  Cardiologist:  Dorris Carnes, MD  Electrophysiologist:  None  Nephrologist:  Dr. Justin Mend  Referring MD: Forrest Moron, MD   Chief Complaint  Patient presents with  . Heart Murmur    History of Present Illness:    Jerry Cantrell is a 53 y.o. male with:  Chronic diastolic CHF  Hypertension  Hyperlipidemia  Diabetes mellitus   Chronic kidney disease stage 4  Sleep apnea on nocturnal BiPap  COPD  Tobacco smoker  Chronic hypoxic respiratory failure  Substance abuse (cocaine)  Jerry Cantrell was last seen by Dr. Harrington Challenger in June 2019.  He was admitted June 22-26, 2020 for acute respiratory failure secondary to pulmonary edema.  He was started on hemodialysis.  He was noted to have a murmur on exam that was thought to the due to his AV fistula.  OP Cardiology follow up was recommended.    He is here alone today.  He is on Monday, Wednesday, Friday dialysis.  He had a problem with his AV fistula yesterday and his dialysis session was cut short.  He has some swelling in his  left arm.  He has not had any chest pain.  His breathing is improved.  He sleeps on 3 pillows chronically.  He denies paroxysmal nocturnal dyspnea.  He denies syncope.  Prior CV studies:   The following studies were reviewed today:  Echocardiogram 10/14/2015 Severe concentric LVH, EF 60-65, normal wall motion, grade 1 diastolic dysfunction, mild RVE  Myoview 07/31/2015  The left ventricular ejection fraction is moderately decreased (30-44%).  Nuclear stress EF: 38%.  There was no ST segment deviation noted during stress.  This is a low risk study. There is no ischemia and no evidence of previous MI .  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  Echocardiogram 06/07/2015 Mild LVH, grade 1 diastolic dysfunction  Echocardiogram 05/15/2015 Moderate LVH, EF 55, grade 1  diastolic dysfunction, moderate LAE  Renal artery [place] 01/30/2013 No evidence of renal artery stenosis bilaterally   Past Medical History:  Diagnosis Date  . Acute diastolic heart failure ([place])   . CHF (congestive heart failure) ([place])   . Diabetes mellitus with nephropathy ([place]) 05/12/2012   Dr. Justin Mend follows  . Hemorrhoids 05/12/2012  . Hyperlipidemia   . Hypertension   . Lumbar spinal stenosis 05/03/2012   Mild with only right L4 nerve root encroachment, no neurogenic claudication   . Lumbar spondylosis 05/03/2012  . Meralgia paraesthetica 05/03/2012  . Meralgia paraesthetica 05/03/2012   On Lyrica which does improve pain.   Marland Kitchen Neuropathy in diabetes ([place]) 05/12/2012  . Obesity   . Pneumonia   . Retinopathy   . Sleep apnea    uses cpap  . Substance abuse ([place])   . Urinary incontinence 05/12/2012   Since the start of Sept. 2013    Surgical Hx: The patient  has a past surgical history that includes Tonsillectomy; Abdominal surgery; Colonoscopy w/ polypectomy; AV fistula placement (Left, 02/06/2016); and Colonoscopy with propofol (N/A, 06/05/2016).   Current Medications: Current Meds  Medication Sig  . Accu-Chek Softclix Lancets lancets Use as instructed  . atorvastatin (LIPITOR) 80 MG tablet Take 1 tablet (80 mg total) by mouth daily at 6 PM.  . carvedilol (COREG) 25 MG tablet Take 25 mg by mouth 2 (two) times daily with a meal.  . cetirizine (ZYRTEC)  10 MG tablet TAKE ONE TABLET BY MOUTH ONCE DAILY FOR ALLERGIES  . cinacalcet (SENSIPAR) 60 MG tablet Take 60 mg by mouth daily.  . fluconazole (DIFLUCAN) 100 MG tablet Take 200 mg by mouth once a week.   . fluticasone (FLONASE) 50 MCG/ACT nasal spray USE TWO SPRAYS INTO EACH NOSTRILS DAILY  . folic acid-vitamin b complex-vitamin c-selenium-zinc (DIALYVITE) 3 MG TABS tablet Take 1 tablet by mouth daily.  . furosemide (LASIX) 80 MG tablet Take 1 tablet (80 mg total) by mouth every Tuesday, Thursday, Saturday, and Sunday.  Marland Kitchen GAVILAX powder TAKE  ONE CAPFUL (17 GRAMS) BY MOUTH 2 (TWO) TIMES DAILY AS NEEDED. (Patient taking differently: Take 17 g by mouth 2 (two) times daily as needed for mild constipation. )  . glucose blood (ACCU-CHEK AVIVA PLUS) test strip 1 each by Other route 5 (five) times daily. Use as instructed  . hydrALAZINE (APRESOLINE) 100 MG tablet Take 1 tablet (100 mg total) by mouth 3 (three) times daily.  . Insulin Glargine (LANTUS SOLOSTAR) 100 UNIT/ML Solostar Pen Inject 45 Units into the skin daily. INJECT 30 UNITS UNDER THE SKIN EVERY MORNING AND INJECT 15 UNITS EVERY NIGHT (Patient taking differently: Inject 45 Units into the skin daily. )  . insulin lispro (HUMALOG KWIKPEN) 100 UNIT/ML KwikPen Inject 0.08 mLs (8 Units total) into the skin 3 (three) times daily. Max daily dose of 40 units daily (Patient taking differently: Inject 10-20 Units into the skin 3 (three) times daily. Max daily dose of 40 units daily)  . Insulin Pen Needle (B-D UF III MINI PEN NEEDLES) 31G X 5 MM MISC Four times daily  . lidocaine-prilocaine (EMLA) cream APPLY SMALL AMOUNT TO ACCESS SITE (AVF) 1 TO 2 HOURS BEFORE DIALYSIS. COVER WITH OCCLUSIVE DRESSING (SARAN WRAP)  . NARCAN 4 MG/0.1ML LIQD nasal spray kit Place 2 mLs into the nose See admin instructions. May repeat dose every 2 to 3 minutes until responsive or ems arrives.  . Nicotine 21-14-7 MG/24HR KIT Place 1 patch onto the skin daily.  . Olopatadine HCl 0.2 % SOLN Apply 1 drop to eye daily. (Patient taking differently: Place 1 drop into both eyes daily. )  . Oxycodone HCl 10 MG TABS Take 10 mg by mouth every 8 (eight) hours.  . pregabalin (LYRICA) 100 MG capsule TAKE 1 CAPSULE BY MOUTH IN THE MORNING AND 1 CAPSULE IN THE EVENING  . verapamil (CALAN-SR) 240 MG CR tablet Take 1 tablet (240 mg total) by mouth at bedtime.  . [DISCONTINUED] verapamil (CALAN-SR) 180 MG CR tablet Take 180 mg by mouth at bedtime.     Allergies:   Patient has no known allergies.   Social History   Tobacco Use   . Smoking status: Current Every Day Smoker    Packs/day: 1.00    Years: 30.00    Pack years: 30.00    Types: Cigarettes  . Smokeless tobacco: Never Used  . Tobacco comment: "Trying - hard to stop"  Substance Use Topics  . Alcohol use: No    Comment: " about 40 ounce beer per month."  down to a beer every 3 weeks  . Drug use: No    Types: Marijuana, Cocaine    Comment: marijuana "laced with something"; today, stated "no" to question of illegal drug use 06/16/2016     Family Hx: The patient's family history includes Alcohol abuse in his father; Diabetes in his sister; Emphysema in his father; Prostate cancer in his father. There is no history of  Colon cancer, Colon polyps, Stomach cancer, or Rectal cancer.  ROS:   Please see the history of present illness.    ROS All other systems reviewed and are negative.   EKGs/Labs/Other Test Reviewed:    EKG:  EKG is  ordered today.  The ekg ordered today demonstrates normal sinus rhythm, heart rate 66, leftward axis, septal Q waves, IVCD, QTC 469, no change from prior tracing  Recent Labs: 02/20/2019: ALT 41; B Natriuretic Peptide 457.1 02/23/2019: BUN 65; Creatinine, Ser 8.30; Hemoglobin 9.1; Platelets 110; Potassium 3.9; Sodium 138   Recent Lipid Panel Lab Results  Component Value Date/Time   CHOL 175 05/10/2017 12:44 PM   TRIG 353 (H) 05/10/2017 12:44 PM   HDL 35 (L) 05/10/2017 12:44 PM   CHOLHDL 5.0 05/10/2017 12:44 PM   CHOLHDL 6.3 (H) 06/16/2016 11:47 AM   LDLCALC 69 05/10/2017 12:44 PM   LDLDIRECT 99 09/16/2010 09:30 PM    Physical Exam:    VS:  BP (!) 166/90   Pulse 69   Ht 6' (1.829 m)   Wt 269 lb (122 kg)   SpO2 98%   BMI 36.48 kg/m     Wt Readings from Last 3 Encounters:  03/07/19 269 lb (122 kg)  02/28/19 265 lb (120.2 kg)  02/24/19 266 lb 15.6 oz (121.1 kg)     Physical Exam  Constitutional: He is oriented to person, place, and time. He appears well-developed and well-nourished. No distress.  HENT:  Head:  Normocephalic and atraumatic.  Eyes: No scleral icterus.  Neck: No JVD present. No thyromegaly present.  Cardiovascular: Normal rate and regular rhythm.  Murmur heard.  Early systolic murmur is present with a grade of 2/6 at the upper right sternal border. Pulmonary/Chest: Effort normal and breath sounds normal. He has no rales.  Abdominal: Soft. There is no hepatomegaly.  Musculoskeletal:        General: No edema (trace ankle edema bilat).  Lymphadenopathy:    He has no cervical adenopathy.  Neurological: He is alert and oriented to person, place, and time.  Skin: Skin is warm and dry.  Psychiatric: He has a normal mood and affect.    ASSESSMENT & PLAN:    1. Chronic diastolic CHF (congestive heart failure) (HCC) Currently stable.  NYHA 2.  Volume management per dialysis.  2. Essential hypertension Blood pressure above target.  Increase verapamil to 240 mg nightly.  Now that he is on dialysis, could consider adding ARB.  Defer this to nephrology.  3. ESRD (end stage renal disease) ([place]) Monday, Wednesday, Friday dialysis.  4. Murmur Murmur sounds like aortic sclerosis.  Obtain 2D echocardiogram to confirm.  5. Tobacco abuse We discussed the importance of quitting smoking.   Dispo:  Return in about 6 months (around 09/07/2019) for Routine Follow Up, w/ Dr. Harrington Challenger, or PA/NP.   Medication Adjustments/Labs and Tests Ordered: Current medicines are reviewed at length with the patient today.  Concerns regarding medicines are outlined above.  Tests Ordered: Orders Placed This Encounter  Procedures  . EKG 12-Lead  . ECHOCARDIOGRAM COMPLETE   Medication Changes: Meds ordered this encounter  Medications  . verapamil (CALAN-SR) 240 MG CR tablet    Sig: Take 1 tablet (240 mg total) by mouth at bedtime.    Dispense:  90 tablet    Refill:  3    Signed, Richardson Dopp, PA-C  03/07/2019 5:43 PM    [facility] [address], [place], [place]  [address] Phone:  [phone]; Fax: [phone]

## 2019-03-06 NOTE — Telephone Encounter (Signed)

## 2019-03-06 NOTE — Patient Outreach (Signed)
Outreach call to pt for telephone assessment/ completion of required assessments, spoke with pt, HIPAA verified, pt states he has just arrived at dialysis and call talk briefly, states he has appointments on off days for dialysis on Tuesday and Thursday and requests RN CM call him back Friday morning for med rec as they will be changing a lot of his medications at dialysis today.  Pt states he continues weighing daily with weight ranging 263- 265 pounds.  Pt states blood sugar still hovers 100-200's range, pt states he is trying to make food choices for renal/ diabetic diet.  Pt reports he continues to have back pain and will not be having any surgery, continues using pain medications with some relief obtained.   THN CM Care Plan Problem One     Most Recent Value  Care Plan Problem One  Knowledge deficit related to diabetes  Role Documenting the Problem One  Care Management [place] for Problem One  Active  THN Long Term Goal   Pt will demonstrate/ verbalize improved self care related to diabetes within 60 days aeb decrease Hgb AIC 1-2 points  THN Long Term Goal Start Date  02/28/19  Interventions for Problem One Long Term Goal  RN CM reviewed plan of care with pt, completed pain, nutrition , quality of life assessments, unable to complete medication reconciliation today  THN CM Short Term Goal #1   Pt will verbalize plate method and practice portion control within 30 days  THN CM Short Term Goal #1 Start Date  02/28/19  Interventions for Short Term Goal #1  RN CM reinforced plate method, renal diet, nutritious food choices    THN CM Care Plan Problem Two     Most Recent Value  Care Plan Problem Two  End stage renal disease (new to dialysis)  Role Documenting the Problem Two  Care Management Coordinator  Care Plan for Problem Two  Active  THN CM Short Term Goal #1   Pt will verbalize interventions for positive health changes/ actions to facilitate successful dialysis/ goals within 30  days  THN CM Short Term Goal #1 Start Date  02/28/19  Interventions for Short Term Goal #2   RN CM reinforced importance of adherence to renal/ diabetic diet , fluid restricitons and attending all dialysis appointments      PLAN Outreach pt end of this week on Friday for medication reconciliation  Jacqlyn Larsen [facility], [place] Coordinator [phone]

## 2019-03-07 ENCOUNTER — Ambulatory Visit (INDEPENDENT_AMBULATORY_CARE_PROVIDER_SITE_OTHER): Payer: Medicare Other | Admitting: Physician Assistant

## 2019-03-07 ENCOUNTER — Other Ambulatory Visit: Payer: Self-pay

## 2019-03-07 ENCOUNTER — Encounter: Payer: Self-pay | Admitting: Physician Assistant

## 2019-03-07 VITALS — BP 166/90 | HR 69 | Ht 72.0 in | Wt 269.0 lb

## 2019-03-07 DIAGNOSIS — I10 Essential (primary) hypertension: Secondary | ICD-10-CM | POA: Diagnosis not present

## 2019-03-07 DIAGNOSIS — I5032 Chronic diastolic (congestive) heart failure: Secondary | ICD-10-CM | POA: Diagnosis not present

## 2019-03-07 DIAGNOSIS — Z720 Tobacco use: Secondary | ICD-10-CM | POA: Diagnosis not present

## 2019-03-07 DIAGNOSIS — R011 Cardiac murmur, unspecified: Secondary | ICD-10-CM | POA: Diagnosis not present

## 2019-03-07 DIAGNOSIS — N186 End stage renal disease: Secondary | ICD-10-CM | POA: Diagnosis not present

## 2019-03-07 MED ORDER — VERAPAMIL HCL ER 240 MG PO TBCR: 240.0000 mg | EXTENDED_RELEASE_TABLET | Freq: Every day | ORAL | 3 refills | Status: AC

## 2019-03-07 NOTE — Patient Instructions (Signed)
Medication Instructions:    Start taking Verapamil 240 mg once a day   If you need a refill on your cardiac medications before your next appointment, please call your pharmacy.   Lab work: NONE ORDERED  TODAY   If you have labs (blood work) drawn today and your tests are completely normal, you will receive your results only by: Marland Kitchen MyChart Message (if you have MyChart) OR . A paper copy in the mail If you have any lab test that is abnormal or we need to change your treatment, we will call you to review the results.  Testing/Procedures: Your physician has requested that you have an echocardiogram. Echocardiography is a painless test that uses sound waves to create images of your heart. It provides your doctor with information about the size and shape of your heart and how well your heart's chambers and valves are working. This procedure takes approximately one hour. There are no restrictions for this procedure.  Follow-Up: At [facility], you and your health needs are our priority.  As part of our continuing mission to provide you with exceptional heart care, we have created designated Provider Care Teams.  These Care Teams include your primary Cardiologist (physician) and Advanced Practice Providers (APPs -  Physician Assistants and Nurse Practitioners) who all work together to provide you with the care you need, when you need it. You will need a follow up appointment in:  6 months.  Please call our office 2 months in advance to schedule this appointment.  You may see Dorris Carnes, MD or one of the following Advanced Practice Providers on your designated Care Team: Richardson Dopp, PA-C [place], [place] . Daune Perch, NP  Any Other Special Instructions Will Be Listed Below (If Applicable).

## 2019-03-07 NOTE — Progress Notes (Signed)
Subjective: Jerry Cantrell is a 53 y.o. y.o. male who presents to clinic for preventative diabetic foot care today. He relates he is seeing Dermatology for psoriasis of the back of his left heel. He peels scales on occasion which causes bleeding and superficial infection.  Forrest Moron, MD is his PCP.   He is now on dialysis on MWF.   Current Outpatient Medications:  .  Accu-Chek Softclix Lancets lancets, Use as instructed, Disp: 450 each, Rfl: 0 .  atorvastatin (LIPITOR) 80 MG tablet, Take 1 tablet (80 mg total) by mouth daily at 6 PM., Disp: 90 tablet, Rfl: 0 .  calcitRIOL (ROCALTROL) 0.25 MCG capsule, Take 0.25 mcg by mouth daily., Disp: , Rfl:  .  carvedilol (COREG) 25 MG tablet, Take 0.5 tablets (12.5 mg total) by mouth 2 (two) times daily., Disp: , Rfl:  .  cetirizine (ZYRTEC) 10 MG tablet, TAKE ONE TABLET BY MOUTH ONCE DAILY FOR ALLERGIES, Disp: 90 tablet, Rfl: 0 .  cinacalcet (SENSIPAR) 60 MG tablet, Take 60 mg by mouth daily., Disp: , Rfl:  .  ferrous sulfate 325 (65 FE) MG tablet, Take 1 tablet (325 mg total) by mouth daily., Disp: 90 tablet, Rfl: 1 .  fluconazole (DIFLUCAN) 100 MG tablet, , Disp: , Rfl:  .  fluticasone (FLONASE) 50 MCG/ACT nasal spray, USE TWO SPRAYS INTO EACH NOSTRILS DAILY, Disp: 16 g, Rfl: 6 .  furosemide (LASIX) 80 MG tablet, Take 1 tablet (80 mg total) by mouth every Tuesday, Thursday, Saturday, and Sunday., Disp: 120 tablet, Rfl: 0 .  GAVILAX powder, TAKE ONE CAPFUL (17 GRAMS) BY MOUTH 2 (TWO) TIMES DAILY AS NEEDED. (Patient taking differently: Take 17 g by mouth 2 (two) times daily as needed for mild constipation. ), Disp: 510 g, Rfl: 1 .  glucose blood (ACCU-CHEK AVIVA PLUS) test strip, 1 each by Other route 5 (five) times daily. Use as instructed, Disp: 450 each, Rfl: 0 .  hydrALAZINE (APRESOLINE) 100 MG tablet, Take 1 tablet (100 mg total) by mouth 3 (three) times daily., Disp: 270 tablet, Rfl: 0 .  Insulin Glargine (LANTUS SOLOSTAR) 100 UNIT/ML Solostar  Pen, Inject 45 Units into the skin daily. INJECT 30 UNITS UNDER THE SKIN EVERY MORNING AND INJECT 15 UNITS EVERY NIGHT (Patient taking differently: Inject 45 Units into the skin daily. ), Disp: 15 mL, Rfl: 6 .  insulin lispro (HUMALOG KWIKPEN) 100 UNIT/ML KwikPen, Inject 0.08 mLs (8 Units total) into the skin 3 (three) times daily. Max daily dose of 40 units daily (Patient taking differently: Inject 10-20 Units into the skin 3 (three) times daily. Max daily dose of 40 units daily), Disp: 15 mL, Rfl: 6 .  Insulin Pen Needle (B-D UF III MINI PEN NEEDLES) 31G X 5 MM MISC, Four times daily, Disp: 360 each, Rfl: 0 .  lidocaine-prilocaine (EMLA) cream, APPLY SMALL AMOUNT TO ACCESS SITE (AVF) 1 TO 2 HOURS BEFORE DIALYSIS. COVER WITH OCCLUSIVE DRESSING (SARAN WRAP), Disp: , Rfl:  .  multivitamin-iron-minerals-folic acid (CENTRUM) chewable tablet, Chew 1 tablet by mouth daily., Disp: , Rfl:  .  NARCAN 4 MG/0.1ML LIQD nasal spray kit, Place 2 mLs into the nose See admin instructions. May repeat dose every 2 to 3 minutes until responsive or ems arrives., Disp: , Rfl:  .  Nicotine 21-14-7 MG/24HR KIT, Place 1 patch onto the skin daily., Disp: 56 each, Rfl: 0 .  Olopatadine HCl 0.2 % SOLN, Apply 1 drop to eye daily. (Patient taking differently: Place 1 drop into both  eyes daily. ), Disp: 1 Bottle, Rfl: 3 .  Oxycodone HCl 10 MG TABS, Take 10 mg by mouth every 8 (eight) hours., Disp: , Rfl:  .  pregabalin (LYRICA) 100 MG capsule, TAKE 1 CAPSULE BY MOUTH IN THE MORNING AND 1 CAPSULE IN THE EVENING, Disp: , Rfl:  .  pregabalin (LYRICA) 25 MG capsule, Take 1 capsule (25 mg total) by mouth 2 (two) times daily., Disp: 20 capsule, Rfl: 0 .  verapamil (VERELAN PM) 180 MG 24 hr capsule, Take 1 capsule (180 mg total) by mouth at bedtime. (Patient taking differently: Take 360 mg by mouth at bedtime. ), Disp: 90 capsule, Rfl: 3  No Known Allergies  Objective: Vitals:   03/06/19 0920  Temp: 97.6 F (36.4 C)    Vascular  Examination: Capillary refill time immediate x 10 digits.  Dorsalis pedis pulses 1/4 b/l.  Posterior tibial pulses nonpalpable b/l.  Digital hair diminished x 10 digits.  Skin temperature gradient WNL b/l.  Dermatological Examination: Skin with normal turgor, texture and tone b/l.  Toenails 1-5 b/l discolored, thick, dystrophic with subungual debris and pain with palpation to nailbeds due to thickness of nails.  Hyperkeratotic lesion b/l hallux and b/l posterior heels with fissuring. No erythema, no edema, no drainage, no flocculence noted.   Musculoskeletal: Muscle strength 5/5 to all LE muscle groups.  HAV with bunion b/l.   Hammertoes b/l.  Neurological: Sensation intact 5/5 b/l with 10 gram monofilament.  Vibratory sensation intact b/l.  Assessment: 1. Painful onychomycosis toenails 1-5 b/l 2.  Callus b/l hallux and b/l heels with fissuring 4.  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 Continue diabetic foot care principles. Literature dispensed on today. 2. Toenails 1-5 b/l were debrided in length and girth without iatrogenic bleeding. 3. Hyperkeratotic lesion(s) pared b/l hallux and b/l heels with sterile scalpel blade without incident. 4. Patient to continue soft, supportive shoe gear daily. 5. Patient to report any pedal injuries to medical professional immediately. 6. Follow up 3 months.  7. Patient/POA to call should there be a concern in the interim.

## 2019-03-09 ENCOUNTER — Encounter: Payer: Self-pay | Admitting: Acute Care

## 2019-03-09 ENCOUNTER — Other Ambulatory Visit: Payer: Self-pay

## 2019-03-09 ENCOUNTER — Ambulatory Visit (INDEPENDENT_AMBULATORY_CARE_PROVIDER_SITE_OTHER): Payer: Medicare Other | Admitting: Acute Care

## 2019-03-09 VITALS — BP 158/92 | HR 69 | Ht 72.0 in | Wt 272.6 lb

## 2019-03-09 DIAGNOSIS — Z992 Dependence on renal dialysis: Secondary | ICD-10-CM | POA: Diagnosis not present

## 2019-03-09 DIAGNOSIS — R911 Solitary pulmonary nodule: Secondary | ICD-10-CM | POA: Diagnosis not present

## 2019-03-09 DIAGNOSIS — N186 End stage renal disease: Secondary | ICD-10-CM

## 2019-03-09 DIAGNOSIS — I5030 Unspecified diastolic (congestive) heart failure: Secondary | ICD-10-CM

## 2019-03-09 DIAGNOSIS — Z720 Tobacco use: Secondary | ICD-10-CM | POA: Diagnosis not present

## 2019-03-09 DIAGNOSIS — I871 Compression of vein: Secondary | ICD-10-CM | POA: Diagnosis not present

## 2019-03-09 DIAGNOSIS — G4733 Obstructive sleep apnea (adult) (pediatric): Secondary | ICD-10-CM | POA: Diagnosis not present

## 2019-03-09 DIAGNOSIS — I110 Hypertensive heart disease with heart failure: Secondary | ICD-10-CM | POA: Diagnosis not present

## 2019-03-09 DIAGNOSIS — T82858A Stenosis of vascular prosthetic devices, implants and grafts, initial encounter: Secondary | ICD-10-CM | POA: Diagnosis not present

## 2019-03-09 NOTE — Assessment & Plan Note (Signed)
Progression from CKD Stage IV to ESRD MWF HD Plan Continue Hemodialysis MWF without fail. Continue lasix on non-dialysis days. Follow up renal as scheduled

## 2019-03-09 NOTE — Assessment & Plan Note (Signed)
Down Load reveals worsening apnea AHI of 54.3 Recent hospitalization and transition from CKD to ESRD with HD on MWF Echo pending Plan: We will send you for a BiPAP titration, as your apnea events are high. You may need a different type of devise with additional settings with a back up rate. Continue on BiPAP at bedtime. You appear to be benefiting from the treatment  Goal is to wear for at least 6 hours each night for maximal clinical benefit. Continue to work on weight loss, as the link between excess weight  and sleep apnea is well established.   Remember to establish a good bedtime routine, and work on sleep hygiene.  Limit daytime naps , avoid stimulants such as caffeine and nicotine close to bedtime, exercise daily to promote sleep quality, avoid heavy , spicy, fried , or rich foods before bed. Ensure adequate exposure to natural light during the day,establish a relaxing bedtime routine with a pleasant sleep environment ( Bedroom between 60 and 67 degrees, turn off bright lights , TV or device screens screens , consider black out curtains or white noise machines) Do not drive if sleepy. Remember to clean mask, tubing, filter, and reservoir once weekly with soapy water.  Follow up with Rexene Edison, NP or Dr. Elsworth Soho   In 1 month after BiPAP adjustments have been made. or before as needed.   Peridex for mouth dryness Please contact office for sooner follow up if symptoms do not improve or worsen or seek emergency care

## 2019-03-09 NOTE — Assessment & Plan Note (Signed)
Please refer to Screening program when patient is 66 He is a current every day smoker with > 30 pack year history

## 2019-03-09 NOTE — Progress Notes (Signed)
History of Present Illness Jerry Cantrell is a 53 y.o. male current every day smoker with polysubstance abuse, severe OSA on nocturnal BiPAP, chronic hypoxic respiratory failure and ESRD requiring HD. He is followed by Dr. Elsworth Soho  Synopsis 53 year old male active smoker with polysubstance abuse (cocaine) followed for severe obstructive sleep apnea on nocturnal BiPAP, chronic hypoxic respiratory failure previously on O2 (d/c 2019 ) , stable pulmonary nodules ( serial CT chest x 3  Years 2016-2019 ), Chronic Bronchitis -active smoker   (Mostly Restrictive dz on PFT, CT )  Medical history significant for chronic diastolic heart failure and ESRD on HD-has an AV fistula in the left arm  BiPAP - Med Sim[facility] FFM; 5-25 ps 5 bipap auto  03/09/2019   Hospital Follow up. Admit date: 02/20/2019 Discharge date: 02/24/2019 Admission for Acute on Chronic Respiratory Failure 2/2 volume overload 2/2 progression to ESRD.Marland Kitchen  He was treated with HD, Weaned to room air and ambulated without oxygen prior to discharge.Nephrology is on board and patient is getting HD three times a week with lasix dosing on non-HD days.   Pt. Presents for hospital follow up. He states he is doing well since discharge. He is now MWF hemodialysis. He is compliant with his  BiPap 25 of 30 nights ( 83%) , however his AHI has increased to 54.3.HIs pressures were changed at his last visit form IPAP of 20 and EPAP of 5 to IPAP of 22 and EPAP of 12.  We will send him for a BiPAP titration. He had HD Friday July 3rd. He had issues with swelling at his L arm  fistulla site, therefore there was no HD on  Monday or Wednesday of this week. He saw a vein specialist today who has opened up the graft to make access easier. He is scheduled for HD tomorrow. He is taking his lasix Saturday, Sunday, Tuesday and Thursday. He states he might not go to HD because he is frustrated that the staff have had difficulty accessing his graft. . I have explained to  him it is to  his benefit to go. The only person he hurts by not going is himself.He states he is working on getting on a list at [facility] for a kidney transplant.I told him he needs to be compliant with HD as this will show he is actively engaged in managing his renal failure. He is not requiring any oxygen at this time.He denies fever, chest pain, he has a 3 pillow  orthopnea o, No  Hemoptysis. He has an echo scheduled for next week.      Test Results: BiPAP download Air curve 10 V auto IPAP 22 cm H2O EPAP 12 cm H2O Easy breathe on Usage days 25 of 30 or 83% Greater than 4 hours 24 days or 80% Less than 4 hours 1 day or 3% Average usage 8 hours 5 minutes AHI is 54.6.  6/26/CXR: Cardiomegaly with mild pulmonary vascular prominence and bilateral interstitial prominence. Findings suggest mild CHF.  CXR 02/20/2019 Airspace opacity right base region consistent with pneumonia.  Cardiomegaly with pulmonary vascular congestion present. No evident Adenopathy.  Echocardiogram 10/14/2015 Severe concentric LVH, EF 60-65, normal wall motion, grade 1 diastolic dysfunction, mild RVE  CBC Latest Ref Rng & Units 02/23/2019 02/22/2019 02/21/2019  WBC 4.0 - 10.5 K/uL 4.3 4.6 4.0  Hemoglobin 13.0 - 17.0 g/dL 9.1(L) 10.5(L) 10.0(L)  Hematocrit 39.0 - 52.0 % 29.8(L) 34.2(L) 32.7(L)  Platelets 150 - 400 K/uL 110(L) 124(L) 109(L)  BMP Latest Ref Rng & Units 02/23/2019 02/22/2019 02/21/2019  Glucose 70 - 99 mg/dL 243(H) 138(H) 48(L)  BUN 6 - 20 mg/dL 65(H) 79(H) 73(H)  Creatinine 0.61 - 1.24 mg/dL 8.30(H) 9.77(H) 9.22(H)  BUN/Creat Ratio 9 - 20 - - -  Sodium 135 - 145 mmol/L 138 141 143  Potassium 3.5 - 5.1 mmol/L 3.9 4.4 4.0  Chloride 98 - 111 mmol/L 100 103 107  CO2 22 - 32 mmol/L 25 25 24   Calcium 8.9 - 10.3 mg/dL 8.7(L) 8.8(L) 8.7(L)    BNP    Component Value Date/Time   BNP 457.1 (H) 02/20/2019 1454   BNP 29.5 04/27/2016 1300    ProBNP    Component Value Date/Time   PROBNP 225.0 (H)  07/16/2015 1158    PFT    Component Value Date/Time   FEV1PRE 2.38 10/28/2015 1435   FEV1POST 2.30 10/28/2015 1435   FVCPRE 2.88 10/28/2015 1435   FVCPOST 2.81 10/28/2015 1435   TLC 5.45 10/28/2015 1435   DLCOUNC 20.30 10/28/2015 1435   PREFEV1FVCRT 82 10/28/2015 1435   PSTFEV1FVCRT 82 10/28/2015 1435    Dg Chest 2 View  Result Date: 02/24/2019 CLINICAL DATA:  Shortness of breath. EXAM: CHEST - 2 VIEW COMPARISON:  02/20/2019. FINDINGS: Cardiomegaly with mild pulmonary vascular prominence and bilateral interstitial prominence. Findings suggest mild CHF. No pleural effusion or pneumothorax. Degenerative change thoracic spine. IMPRESSION: Cardiomegaly with mild pulmonary vascular prominence and bilateral interstitial prominence. Findings suggest mild CHF. Electronically Signed   By: Marcello Moores  Register   On: 02/24/2019 10:47   Dg Chest Portable 1 View  Result Date: 02/20/2019 CLINICAL DATA:  Hypoglycemia with shortness of breath and lethargy EXAM: PORTABLE CHEST 1 VIEW COMPARISON:  Chest radiograph October 27, 2016 and chest CT January 31, 2018 FINDINGS: There is airspace opacity throughout the right base region. Lungs elsewhere are clear. There is cardiomegaly with mild pulmonary venous hypertension. No adenopathy. No bone lesions. IMPRESSION: Airspace opacity right base region consistent with pneumonia. Cardiomegaly with pulmonary vascular congestion present. No evident adenopathy. Electronically Signed   By: Lowella Grip III M.D.   On: 02/20/2019 15:45     Past medical hx Past Medical History:  Diagnosis Date   Acute diastolic heart failure (HCC)    CHF (congestive heart failure) ([place])    Diabetes mellitus with nephropathy ([place]) 05/12/2012   Dr. Justin Mend follows   Hemorrhoids 05/12/2012   Hyperlipidemia    Hypertension    Lumbar spinal stenosis 05/03/2012   Mild with only right L4 nerve root encroachment, no neurogenic claudication    Lumbar spondylosis 05/03/2012   Meralgia  paraesthetica 05/03/2012   Meralgia paraesthetica 05/03/2012   On Lyrica which does improve pain.    Neuropathy in diabetes ([place]) 05/12/2012   Obesity    Pneumonia    Retinopathy    Sleep apnea    uses cpap   Substance abuse ([place])    Urinary incontinence 05/12/2012   Since the start of Sept. 2013      Social History   Tobacco Use   Smoking status: Current Every Day Smoker    Packs/day: 1.00    Years: 30.00    Pack years: 30.00    Types: Cigarettes   Smokeless tobacco: Never Used   Tobacco comment: "Trying - hard to stop"  Substance Use Topics   Alcohol use: No    Comment: " about 40 ounce beer per month."  down to a beer every 3 weeks   Drug use: No  Types: Marijuana, Cocaine    Comment: marijuana "laced with something"; today, stated "no" to question of illegal drug use 06/16/2016    Mr.Mcgowen reports that he has been smoking cigarettes. He has a 30.00 pack-year smoking history. He has never used smokeless tobacco. He reports that he does not drink alcohol or use drugs.  Tobacco Cessation: Current every day smoker, 30+ pack year smoking history.  I have spent 4 minutes counseling patient on smoking cessation this visit. Patient verbalizes understanding of their  choice to continue smoking and the negative health consequences including worsening of COPD, risk of lung cancer , stroke and heart disease.Marland Kitchen    Past surgical hx, Family hx, Social hx all reviewed.  Current Outpatient Medications on File Prior to Visit  Medication Sig   Accu-Chek Softclix Lancets lancets Use as instructed   atorvastatin (LIPITOR) 80 MG tablet Take 1 tablet (80 mg total) by mouth daily at 6 PM.   carvedilol (COREG) 25 MG tablet Take 25 mg by mouth 2 (two) times daily with a meal.   cetirizine (ZYRTEC) 10 MG tablet TAKE ONE TABLET BY MOUTH ONCE DAILY FOR ALLERGIES   cinacalcet (SENSIPAR) 60 MG tablet Take 60 mg by mouth daily.   fluconazole (DIFLUCAN) 100 MG tablet Take 200 mg by  mouth once a week.    fluticasone (FLONASE) 50 MCG/ACT nasal spray USE TWO SPRAYS INTO EACH NOSTRILS DAILY   folic acid-vitamin b complex-vitamin c-selenium-zinc (DIALYVITE) 3 MG TABS tablet Take 1 tablet by mouth daily.   furosemide (LASIX) 80 MG tablet Take 1 tablet (80 mg total) by mouth every Tuesday, Thursday, Saturday, and Sunday.   GAVILAX powder TAKE ONE CAPFUL (17 GRAMS) BY MOUTH 2 (TWO) TIMES DAILY AS NEEDED. (Patient taking differently: Take 17 g by mouth 2 (two) times daily as needed for mild constipation. )   glucose blood (ACCU-CHEK AVIVA PLUS) test strip 1 each by Other route 5 (five) times daily. Use as instructed   hydrALAZINE (APRESOLINE) 100 MG tablet Take 1 tablet (100 mg total) by mouth 3 (three) times daily.   Insulin Glargine (LANTUS SOLOSTAR) 100 UNIT/ML Solostar Pen Inject 45 Units into the skin daily. INJECT 30 UNITS UNDER THE SKIN EVERY MORNING AND INJECT 15 UNITS EVERY NIGHT (Patient taking differently: Inject 45 Units into the skin daily. )   insulin lispro (HUMALOG KWIKPEN) 100 UNIT/ML KwikPen Inject 0.08 mLs (8 Units total) into the skin 3 (three) times daily. Max daily dose of 40 units daily (Patient taking differently: Inject 10-20 Units into the skin 3 (three) times daily. Max daily dose of 40 units daily)   Insulin Pen Needle (B-D UF III MINI PEN NEEDLES) 31G X 5 MM MISC Four times daily   lidocaine-prilocaine (EMLA) cream APPLY SMALL AMOUNT TO ACCESS SITE (AVF) 1 TO 2 HOURS BEFORE DIALYSIS. COVER WITH OCCLUSIVE DRESSING (SARAN WRAP)   NARCAN 4 MG/0.1ML LIQD nasal spray kit Place 2 mLs into the nose See admin instructions. May repeat dose every 2 to 3 minutes until responsive or ems arrives.   Nicotine 21-14-7 MG/24HR KIT Place 1 patch onto the skin daily.   Olopatadine HCl 0.2 % SOLN Apply 1 drop to eye daily. (Patient taking differently: Place 1 drop into both eyes daily. )   Oxycodone HCl 10 MG TABS Take 10 mg by mouth every 8 (eight) hours.    pregabalin (LYRICA) 100 MG capsule TAKE 1 CAPSULE BY MOUTH IN THE MORNING AND 1 CAPSULE IN THE EVENING   verapamil (CALAN-SR) 240  MG CR tablet Take 1 tablet (240 mg total) by mouth at bedtime.   No current facility-administered medications on file prior to visit.      No Known Allergies  Review Of Systems:  Constitutional:   No  weight loss, night sweats,  Fevers, chills, fatigue, or  lassitude.  HEENT:   No headaches,  Difficulty swallowing,  Tooth/dental problems, or  Sore throat,                No sneezing, itching, ear ache, nasal congestion, post nasal drip,   CV:  No chest pain,  Orthopnea, PND, swelling in lower extremities, anasarca, dizziness, palpitations, syncope.   GI  No heartburn, indigestion, abdominal pain, nausea, vomiting, diarrhea, change in bowel habits, loss of appetite, bloody stools.   Resp: + shortness of breath with exertion none  at rest.  No excess mucus, no productive cough,  No non-productive cough,  No coughing up of blood.  No change in color of mucus.  No wheezing.  No chest wall deformity  Skin: no rash or lesions.  GU: no dysuria, change in color of urine, no urgency or frequency.  No flank pain, no hematuria   MS:  No joint pain or swelling.  No decreased range of motion.  No back pain.  Psych:  No change in mood or affect. No depression or anxiety.  No memory loss.   Vital Signs BP (!) 158/92 (BP Location: Right Arm, Cuff Size: Large)    Pulse 69    Ht 6' (1.829 m)    Wt 272 lb 9.6 oz (123.7 kg)    SpO2 98%    BMI 36.97 kg/m    Physical Exam:  General- No distress,  A&Ox3, pleasant ENT: No sinus tenderness, TM clear, pale nasal mucosa, no oral exudate,no post nasal drip, no LAN Cardiac: S1, S2, regular rate and rhythm, + systolic  Murmur 2/6 upper right sternal border Chest: No wheeze/ rales/ dullness; no accessory muscle use, no nasal flaring, no sternal retractions Abd.: Soft Non-tender, ND, NT, BS +, Body mass index is 36.97  kg/m. Ext: No clubbing cyanosis, edema, L forearm graft with + Bruit and thrill, slightly swollen 2/2 recent re-vascularization procedure, dressing is clear dry and intact. Neuro:  normal strength, MAE x 4, A&O x 3 Skin: No rashes, no lesions, multiple tattoos, warm and dry Psych: normal mood and behavior, frustrated with HD transition   Assessment/Plan  Obstructive sleep apnea Down Load reveals worsening apnea AHI of 54.3 Recent hospitalization and transition from CKD to ESRD with HD on MWF Echo pending Plan: We will send you for a BiPAP titration, as your apnea events are high. You may need a different type of devise with additional settings with a back up rate. Continue on BiPAP at bedtime. You appear to be benefiting from the treatment  Goal is to wear for at least 6 hours each night for maximal clinical benefit. Continue to work on weight loss, as the link between excess weight  and sleep apnea is well established.   Remember to establish a good bedtime routine, and work on sleep hygiene.  Limit daytime naps , avoid stimulants such as caffeine and nicotine close to bedtime, exercise daily to promote sleep quality, avoid heavy , spicy, fried , or rich foods before bed. Ensure adequate exposure to natural light during the day,establish a relaxing bedtime routine with a pleasant sleep environment ( Bedroom between 60 and 67 degrees, turn off bright lights , TV or  device screens screens , consider black out curtains or white noise machines) Do not drive if sleepy. Remember to clean mask, tubing, filter, and reservoir once weekly with soapy water.  Follow up with Rexene Edison, NP or Dr. Elsworth Soho   In 1 month after BiPAP adjustments have been made. or before as needed.   Peridex for mouth dryness Please contact office for sooner follow up if symptoms do not improve or worsen or seek emergency care    Diastolic heart failure secondary to hypertension  Follow up with Cardiology as is  scheduled 03/16/2019. This appointment is at 0915 am. Please call cardiology for location confirmation of appointment Phone: [phone]   ESRD (end stage renal disease) ([place]) Progression from CKD Stage IV to ESRD MWF HD Plan Continue Hemodialysis MWF without fail. Continue lasix on non-dialysis days. Follow up renal as scheduled  Pulmonary nodule Please refer to Screening program when patient is 25 He is a current every day smoker with > 30 pack year history  Tobacco abuse I have spent 4 minutes counseling patient on smoking cessation this visit. Patient verbalizes understanding of their  choice to continue smoking and the negative health consequences including worsening of COPD, risk of lung cancer , stroke and heart disease..    I have offered smoking cessation classes Pt is not currently ready to quit His son has recently been incarcerated and he is very stressed with his ESRD.    Magdalen Spatz, NP 03/09/2019  4:19 PM

## 2019-03-09 NOTE — Patient Instructions (Addendum)
It is good to see you today. We will send you for a BiPAP titration, as your apnea events are high. You may need a different type of devise with additional settings with a back up rate. Follow up with Cardiology as is scheduled 03/16/2019. This appointment is at [place] am. Please call cardiology for location confirmation of appointment Phone: [phone] Continue Hemodialysis MWF without fail. Continue lasix on non-dialysis days. Follow up with down Load 1 month after adjustments have been made.   Continue on BiPAP at bedtime. You appear to be benefiting from the treatment  Goal is to wear for at least 6 hours each night for maximal clinical benefit. Continue to work on weight loss, as the link between excess weight  and sleep apnea is well established.   Remember to establish a good bedtime routine, and work on sleep hygiene.  Limit daytime naps , avoid stimulants such as caffeine and nicotine close to bedtime, exercise daily to promote sleep quality, avoid heavy , spicy, fried , or rich foods before bed. Ensure adequate exposure to natural light during the day,establish a relaxing bedtime routine with a pleasant sleep environment ( Bedroom between 60 and 67 degrees, turn off bright lights , TV or device screens screens , consider black out curtains or white noise machines) Do not drive if sleepy. Remember to clean mask, tubing, filter, and reservoir once weekly with soapy water.  Follow up with Rexene Edison, NP or Dr. Elsworth Soho   In 1 month after BiPAP adjustments have been made. or before as needed.   Peridex for mouth dryness  Please work on quitting smoking. This is the single most powerful action that you can take to decrease you risk of lung cancer, pulmonary disease, heart disease, and stroke.

## 2019-03-09 NOTE — Assessment & Plan Note (Signed)
  Follow up with Cardiology as is scheduled 03/16/2019. This appointment is at [place] am. Please call cardiology for location confirmation of appointment Phone: [phone]

## 2019-03-09 NOTE — Assessment & Plan Note (Signed)
I have spent 4 minutes counseling patient on smoking cessation this visit. Patient verbalizes understanding of their  choice to continue smoking and the negative health consequences including worsening of COPD, risk of lung cancer , stroke and heart disease..    I have offered smoking cessation classes Pt is not currently ready to quit His son has recently been incarcerated and he is very stressed with his ESRD.

## 2019-03-10 ENCOUNTER — Ambulatory Visit: Payer: Self-pay | Admitting: Adult Health

## 2019-03-10 ENCOUNTER — Other Ambulatory Visit: Payer: Self-pay | Admitting: *Deleted

## 2019-03-10 NOTE — Patient Outreach (Signed)
Outreach call to pt for medication reconciliation, spoke with pt, HIPAA verified, pt states it is not a good time to talk as he is not at home, pt states he had " some issues with dialysis, my vein blew and my arm is swollen"  Pt states he saw a vein specialist and will not be going back to dialysis until Monday 03/13/19.  Pt states he is aware of signs/ symptoms fluid overload and continues weighing daily.  Pt requests call on Monday.  PLAN Outreach pt Monday 03/13/19 Review medications with pt  Jacqlyn Larsen [facility], [place] Coordinator [phone]

## 2019-03-13 ENCOUNTER — Encounter: Payer: Self-pay | Admitting: Family Medicine

## 2019-03-13 ENCOUNTER — Other Ambulatory Visit: Payer: Self-pay | Admitting: *Deleted

## 2019-03-13 DIAGNOSIS — Z23 Encounter for immunization: Secondary | ICD-10-CM | POA: Diagnosis not present

## 2019-03-13 DIAGNOSIS — Z992 Dependence on renal dialysis: Secondary | ICD-10-CM | POA: Diagnosis not present

## 2019-03-13 DIAGNOSIS — N186 End stage renal disease: Secondary | ICD-10-CM | POA: Diagnosis not present

## 2019-03-13 DIAGNOSIS — D509 Iron deficiency anemia, unspecified: Secondary | ICD-10-CM | POA: Diagnosis not present

## 2019-03-13 DIAGNOSIS — N2581 Secondary hyperparathyroidism of renal origin: Secondary | ICD-10-CM | POA: Diagnosis not present

## 2019-03-13 DIAGNOSIS — D689 Coagulation defect, unspecified: Secondary | ICD-10-CM | POA: Diagnosis not present

## 2019-03-13 DIAGNOSIS — D631 Anemia in chronic kidney disease: Secondary | ICD-10-CM | POA: Diagnosis not present

## 2019-03-13 DIAGNOSIS — E1122 Type 2 diabetes mellitus with diabetic chronic kidney disease: Secondary | ICD-10-CM | POA: Diagnosis not present

## 2019-03-13 NOTE — Patient Outreach (Signed)
Outreach call to pt for medication reconciliation, spoke with pt, HIPAA verified, medications reviewed with pt over the phone, pt reports he has all medications and taking as prescribed.  PLAN Outreach pt next month for telephone assessment  Jacqlyn Larsen [facility], [place] Coordinator [phone]

## 2019-03-14 ENCOUNTER — Ambulatory Visit: Payer: Medicare Other | Admitting: Family Medicine

## 2019-03-14 DIAGNOSIS — L309 Dermatitis, unspecified: Secondary | ICD-10-CM | POA: Diagnosis not present

## 2019-03-14 DIAGNOSIS — B351 Tinea unguium: Secondary | ICD-10-CM | POA: Diagnosis not present

## 2019-03-14 DIAGNOSIS — L011 Impetiginization of other dermatoses: Secondary | ICD-10-CM | POA: Diagnosis not present

## 2019-03-14 LAB — CBC AND DIFFERENTIAL: Hemoglobin: 10.6 — AB (ref 13.5–17.5)

## 2019-03-16 ENCOUNTER — Other Ambulatory Visit: Payer: Self-pay

## 2019-03-16 ENCOUNTER — Ambulatory Visit (HOSPITAL_COMMUNITY): Payer: Medicare Other | Attending: Internal Medicine

## 2019-03-16 DIAGNOSIS — I5032 Chronic diastolic (congestive) heart failure: Secondary | ICD-10-CM | POA: Diagnosis not present

## 2019-03-16 DIAGNOSIS — H524 Presbyopia: Secondary | ICD-10-CM | POA: Diagnosis not present

## 2019-03-16 DIAGNOSIS — H52223 Regular astigmatism, bilateral: Secondary | ICD-10-CM | POA: Diagnosis not present

## 2019-03-17 DIAGNOSIS — E1122 Type 2 diabetes mellitus with diabetic chronic kidney disease: Secondary | ICD-10-CM | POA: Diagnosis not present

## 2019-03-17 DIAGNOSIS — D689 Coagulation defect, unspecified: Secondary | ICD-10-CM | POA: Diagnosis not present

## 2019-03-17 DIAGNOSIS — N2581 Secondary hyperparathyroidism of renal origin: Secondary | ICD-10-CM | POA: Diagnosis not present

## 2019-03-17 DIAGNOSIS — D509 Iron deficiency anemia, unspecified: Secondary | ICD-10-CM | POA: Diagnosis not present

## 2019-03-17 DIAGNOSIS — Z23 Encounter for immunization: Secondary | ICD-10-CM | POA: Diagnosis not present

## 2019-03-17 DIAGNOSIS — Z992 Dependence on renal dialysis: Secondary | ICD-10-CM | POA: Diagnosis not present

## 2019-03-17 DIAGNOSIS — D631 Anemia in chronic kidney disease: Secondary | ICD-10-CM | POA: Diagnosis not present

## 2019-03-17 DIAGNOSIS — N186 End stage renal disease: Secondary | ICD-10-CM | POA: Diagnosis not present

## 2019-03-18 LAB — CBC AND DIFFERENTIAL: Hemoglobin: 11.2 — AB (ref 13.5–17.5)

## 2019-03-20 ENCOUNTER — Ambulatory Visit (INDEPENDENT_AMBULATORY_CARE_PROVIDER_SITE_OTHER): Payer: Medicare Other | Admitting: Family Medicine

## 2019-03-20 ENCOUNTER — Other Ambulatory Visit: Payer: Self-pay

## 2019-03-20 ENCOUNTER — Encounter: Payer: Self-pay | Admitting: Family Medicine

## 2019-03-20 VITALS — BP 146/74 | HR 71 | Temp 98.6°F | Resp 16 | Wt 272.0 lb

## 2019-03-20 DIAGNOSIS — I10 Essential (primary) hypertension: Secondary | ICD-10-CM | POA: Diagnosis not present

## 2019-03-20 DIAGNOSIS — D509 Iron deficiency anemia, unspecified: Secondary | ICD-10-CM | POA: Diagnosis not present

## 2019-03-20 DIAGNOSIS — E11649 Type 2 diabetes mellitus with hypoglycemia without coma: Secondary | ICD-10-CM

## 2019-03-20 DIAGNOSIS — F339 Major depressive disorder, recurrent, unspecified: Secondary | ICD-10-CM | POA: Diagnosis not present

## 2019-03-20 DIAGNOSIS — IMO0002 Reserved for concepts with insufficient information to code with codable children: Secondary | ICD-10-CM

## 2019-03-20 DIAGNOSIS — T23002A Burn of unspecified degree of left hand, unspecified site, initial encounter: Secondary | ICD-10-CM

## 2019-03-20 DIAGNOSIS — N186 End stage renal disease: Secondary | ICD-10-CM | POA: Diagnosis not present

## 2019-03-20 DIAGNOSIS — T300 Burn of unspecified body region, unspecified degree: Secondary | ICD-10-CM

## 2019-03-20 DIAGNOSIS — D631 Anemia in chronic kidney disease: Secondary | ICD-10-CM | POA: Diagnosis not present

## 2019-03-20 DIAGNOSIS — N2581 Secondary hyperparathyroidism of renal origin: Secondary | ICD-10-CM | POA: Diagnosis not present

## 2019-03-20 DIAGNOSIS — Z23 Encounter for immunization: Secondary | ICD-10-CM | POA: Diagnosis not present

## 2019-03-20 DIAGNOSIS — Z992 Dependence on renal dialysis: Secondary | ICD-10-CM | POA: Diagnosis not present

## 2019-03-20 DIAGNOSIS — E0865 Diabetes mellitus due to underlying condition with hyperglycemia: Secondary | ICD-10-CM

## 2019-03-20 DIAGNOSIS — D689 Coagulation defect, unspecified: Secondary | ICD-10-CM | POA: Diagnosis not present

## 2019-03-20 DIAGNOSIS — Z794 Long term (current) use of insulin: Secondary | ICD-10-CM

## 2019-03-20 DIAGNOSIS — E1122 Type 2 diabetes mellitus with diabetic chronic kidney disease: Secondary | ICD-10-CM | POA: Diagnosis not present

## 2019-03-20 DIAGNOSIS — E083499 Diabetes mellitus due to underlying condition with severe nonproliferative diabetic retinopathy without macular edema, unspecified eye: Secondary | ICD-10-CM

## 2019-03-20 DIAGNOSIS — Z6837 Body mass index (BMI) 37.0-37.9, adult: Secondary | ICD-10-CM

## 2019-03-20 MED ORDER — FREESTYLE LIBRE 14 DAY READER DEVI: 1.0000 "application " | Freq: Three times a day (TID) | 11 refills | Status: DC

## 2019-03-20 MED ORDER — GLUCOSE 40 % PO GEL: 1.0000 | Freq: Once | ORAL | 1 refills | Status: DC | PRN

## 2019-03-20 MED ORDER — SILVER SULFADIAZINE 1 % EX CREA: 1.0000 "application " | TOPICAL_CREAM | Freq: Every day | CUTANEOUS | 0 refills | Status: DC

## 2019-03-20 MED ORDER — BACITRACIN-POLYMYXIN B 500-10000 UNIT/GM EX OINT: TOPICAL_OINTMENT | Freq: Two times a day (BID) | CUTANEOUS | 0 refills | Status: AC

## 2019-03-20 MED ORDER — FREESTYLE LIBRE 2 READER SYSTM DEVI: 1.0000 | 0 refills | Status: DC

## 2019-03-20 NOTE — Progress Notes (Signed)
Established Patient Office Visit  Subjective:  Patient ID: Jerry Cantrell, male    DOB: May 11, 1966  Age: 53 y.o. MRN: [id number]  CC:  Chief Complaint  Patient presents with  . Hospitalization Follow-up    was in the ER on 6/22     HPI Jerry Cantrell presents for   This patient is well known to the practice and multiple specialist  Since his hospitalization on 02/20/2019 due to ARF on ESRD who was recently started on dialysis He is now on Dialysis- M, W, F  He reports that he had a fistula placed 2 years ago but it was not initiated until this visit  Diabetes Mellitus: Patient presents for follow up of diabetes. His fasting glucose was 64 at 5:30am and at 8am his sugar was 137 after drinking a glass of orange juice for the low sugars. States that in the morning he takes lantus 45 units For the rest of the day he takes lispro 8 units tid but he adjusts this based on what he is eating He had an episode of hypoglycemia and blacked out and required hospitalization  Lab Results  Component Value Date   HGBA1C 6.4 (A) 12/09/2018    Burn on skin  He touch an exhaust by accident and burned his left hand 4th digit He has a burn on his hand on the ring finger  He states that he put neosporin  He reports that he has a blister due to the burn from the injury  Hypertension: Patient here for follow-up of elevated blood pressure. He is not exercising and is adherent to low salt diet.  Blood pressure is well controlled at home. Cardiac symptoms none. Patient denies chest pain, chest pressure/discomfort, dyspnea, fatigue and irregular heart beat.  Verapamil increased to 213m  By Cardiology BP Readings from Last 3 Encounters:  03/20/19 (!) 146/74  03/09/19 (!) 158/92  03/07/19 (!) 166/90   Substance abuse  Moderate Depression He reports that he is six months clean and waiting for transplant  He denies suicidal thoughts He is putting his faith in God [facility]  02/28/2019 02/28/2019 02/28/2019 12/09/2018  Decreased Interest 0 0 0 0 0  Down, Depressed, Hopeless 0 0 0 0 0  PHQ - 2 Score 0 0 0 0 0  Some recent data might be hidden    Past Medical History:  Diagnosis Date  . Acute diastolic heart failure ([facility]   . CH[facility] (congestive heart failure) ([facility]   . Diabetes mellitus with nephropathy ([facility] 05/12/2012   Dr. WJustin Mendfollows  . Hemorrhoids 05/12/2012  . Hyperlipidemia   . Hypertension   . Lumbar spinal stenosis 05/03/2012   Mild with only right L4 nerve root encroachment, no neurogenic claudication   . Lumbar spondylosis 05/03/2012  . Meralgia paraesthetica 05/03/2012  . Meralgia paraesthetica 05/03/2012   On Lyrica which does improve pain.   .Marland KitchenNeuropathy in diabetes ([facility] 05/12/2012  . Obesity   . Pneumonia   . Retinopathy   . Sleep apnea    uses cpap  . Substance abuse ([facility]   . Urinary incontinence 05/12/2012   Since the start of Sept. 2013     Past Surgical History:  Procedure Laterality Date  . ABDOMINAL SURGERY     Abscess I&D 2/2 infected hair  . AV FISTULA PLACEMENT Left 02/06/2016   Procedure: LEFT ARM RADIOCEPHALIC ARTERIOVENOUS (AV) FISTULA CREATION;  Surgeon: TRosetta Posner MD;  [place] [facility]  Service: Vascular;  Laterality:  Left;  . COLONOSCOPY W/ POLYPECTOMY     pt to bring records  . COLONOSCOPY WITH PROPOFOL N/A 06/05/2016   Procedure: COLONOSCOPY WITH PROPOFOL;  Surgeon: Doran Stabler, MD;  Location: [facility];  Service: Gastroenterology;  Laterality: N/A;  . TONSILLECTOMY      Family History  Problem Relation Age of Onset  . Prostate cancer Father   . Alcohol abuse Father   . Emphysema Father        smoked  . Diabetes Sister   . Colon cancer Neg Hx   . Colon polyps Neg Hx   . Stomach cancer Neg Hx   . Rectal cancer Neg Hx     Social History   Socioeconomic History  . Marital status: Single    Spouse name: Not on file  . Number of children: Not on file  . Years of education: Not on file  . Highest  education level: Not on file  Occupational History  . Occupation: dietary services    Employer: [employer]  Social Needs  . Financial resource strain: Not on file  . Food insecurity    Worry: Not on file    Inability: Not on file  . Transportation needs    Medical: Not on file    Non-medical: Not on file  Tobacco Use  . Smoking status: Current Every Day Smoker    Packs/day: 1.00    Years: 30.00    Pack years: 30.00    Types: Cigarettes  . Smokeless tobacco: Never Used  . Tobacco comment: "Trying - hard to stop"  Substance and Sexual Activity  . Alcohol use: No    Comment: " about 40 ounce beer per month."  down to a beer every 3 weeks  . Drug use: No    Types: Marijuana, Cocaine    Comment: marijuana "laced with something"; today, stated "no" to question of illegal drug use 06/16/2016  . Sexual activity: Not on file  Lifestyle  . Physical activity    Days per week: Not on file    Minutes per session: Not on file  . Stress: Not on file  Relationships  . Social Herbalist on phone: Not on file    Gets together: Not on file    Attends religious service: Not on file    Active member of club or organization: Not on file    Attends meetings of clubs or organizations: Not on file    Relationship status: Not on file  . Intimate partner violence    Fear of current or ex partner: Not on file    Emotionally abused: Not on file    Physically abused: Not on file    Forced sexual activity: Not on file  Other Topics Concern  . Not on file  Social History Narrative  . Not on file    Outpatient Medications Prior to Visit  Medication Sig Dispense Refill  . Accu-Chek Softclix Lancets lancets Use as instructed 450 each 0  . atorvastatin (LIPITOR) 80 MG tablet Take 1 tablet (80 mg total) by mouth daily at 6 PM. 90 tablet 0  . carvedilol (COREG) 25 MG tablet Take 25 mg by mouth 2 (two) times daily with a meal.    . cetirizine (ZYRTEC) 10 MG tablet TAKE ONE TABLET BY MOUTH  ONCE DAILY FOR ALLERGIES 90 tablet 0  . cinacalcet (SENSIPAR) 60 MG tablet Take 60 mg by mouth daily.    . fluconazole (DIFLUCAN) 100 MG  tablet Take 200 mg by mouth once a week.     . fluticasone (FLONASE) 50 MCG/ACT nasal spray USE TWO SPRAYS INTO EACH NOSTRILS DAILY 16 g 6  . folic acid-vitamin b complex-vitamin c-selenium-zinc (DIALYVITE) 3 MG TABS tablet Take 1 tablet by mouth daily.    . furosemide (LASIX) 80 MG tablet Take 1 tablet (80 mg total) by mouth every Tuesday, Thursday, Saturday, and Sunday. 120 tablet 0  . GAVILAX powder TAKE ONE CAPFUL (17 GRAMS) BY MOUTH 2 (TWO) TIMES DAILY AS NEEDED. (Patient taking differently: Take 17 g by mouth 2 (two) times daily as needed for mild constipation. ) 510 g 1  . glucose blood (ACCU-CHEK AVIVA PLUS) test strip 1 each by Other route 5 (five) times daily. Use as instructed 450 each 0  . Insulin Glargine (LANTUS SOLOSTAR) 100 UNIT/ML Solostar Pen Inject 45 Units into the skin daily. INJECT 30 UNITS UNDER THE SKIN EVERY MORNING AND INJECT 15 UNITS EVERY NIGHT (Patient taking differently: Inject 45 Units into the skin daily. ) 15 mL 6  . insulin lispro (HUMALOG KWIKPEN) 100 UNIT/ML KwikPen Inject 0.08 mLs (8 Units total) into the skin 3 (three) times daily. Max daily dose of 40 units daily (Patient taking differently: Inject 10-20 Units into the skin 3 (three) times daily. Max daily dose of 40 units daily) 15 mL 6  . Insulin Pen Needle (B-D UF III MINI PEN NEEDLES) 31G X 5 MM MISC Four times daily 360 each 0  . lidocaine-prilocaine (EMLA) cream APPLY SMALL AMOUNT TO ACCESS SITE (AVF) 1 TO 2 HOURS BEFORE DIALYSIS. COVER WITH OCCLUSIVE DRESSING (SARAN WRAP)    . NARCAN 4 MG/0.1ML LIQD nasal spray kit Place 2 mLs into the nose See admin instructions. May repeat dose every 2 to 3 minutes until responsive or ems arrives.    . Nicotine 21-14-7 MG/24HR KIT Place 1 patch onto the skin daily. 56 each 0  . Olopatadine HCl 0.2 % SOLN Apply 1 drop to eye daily.  (Patient taking differently: Place 1 drop into both eyes daily. ) 1 Bottle 3  . Oxycodone HCl 10 MG TABS Take 10 mg by mouth every 8 (eight) hours.    . pregabalin (LYRICA) 100 MG capsule TAKE 1 CAPSULE BY MOUTH IN THE MORNING AND 1 CAPSULE IN THE EVENING    . verapamil (CALAN-SR) 240 MG CR tablet Take 1 tablet (240 mg total) by mouth at bedtime. 90 tablet 3  . hydrALAZINE (APRESOLINE) 100 MG tablet Take 1 tablet (100 mg total) by mouth 3 (three) times daily. 270 tablet 0  . clobetasol cream (TEMOVATE) 0.05 % APPLY TO AFFECED ARES(S) TOPICALLY TWICE DAILY     No facility-administered medications prior to visit.     No Known Allergies  ROS Review of Systems Review of Systems  Constitutional: Negative for activity change, appetite change, chills and fever.  HENT: Negative for congestion, nosebleeds, trouble swallowing and voice change.   Respiratory: Negative for cough, shortness of breath and wheezing.   Gastrointestinal: Negative for diarrhea, nausea and vomiting.  Genitourinary: Negative for difficulty urinating, dysuria, flank pain and hematuria.  Musculoskeletal: Negative for back pain, joint swelling and neck pain.  Neurological: Negative for dizziness, speech difficulty, light-headedness and numbness.  See HPI. All other review of systems negative.     Objective:    Physical Exam  BP (!) 146/74   Pulse 71   Temp 98.6 F (37 C) (Oral)   Resp 16   Wt 272 lb (123.4  kg)   SpO2 96%   BMI 36.89 kg/m  Wt Readings from Last 3 Encounters:  03/20/19 272 lb (123.4 kg)  03/09/19 272 lb 9.6 oz (123.7 kg)  03/07/19 269 lb (122 kg)   Physical Exam  Constitutional: Oriented to person, place, and time. Appears well-developed and well-nourished.  HENT:  Head: Normocephalic and atraumatic.  Eyes: Conjunctivae and EOM are normal.  Cardiovascular: Normal rate, regular rhythm, normal heart sounds and intact distal pulses.  No murmur heard. Pulmonary/Chest: Effort normal and breath  sounds normal. No stridor. No respiratory distress. Has no wheezes.  Neurological: Is alert and oriented to person, place, and time.  Skin: Skin is warm. Capillary refill takes less than 2 seconds. Numerous nodules and scars from acne and surgeries. Psychiatric: Has a normal mood and affect. Behavior is normal. Judgment and thought content normal.    Health Maintenance Due  Topic Date Due  . LIPID PANEL  05/10/2018  . FOOT EXAM  02/15/2019  . HEMOGLOBIN A1C  03/10/2019    There are no preventive care reminders to display for this patient.  Lab Results  Component Value Date   TSH 2.57 06/16/2016   Lab Results  Component Value Date   WBC 4.3 02/23/2019   HGB 9.1 (L) 02/23/2019   HCT 29.8 (L) 02/23/2019   MCV 95.5 02/23/2019   PLT 110 (L) 02/23/2019   Lab Results  Component Value Date   NA 138 02/23/2019   K 3.9 02/23/2019   CO2 25 02/23/2019   GLUCOSE 243 (H) 02/23/2019   BUN 65 (H) 02/23/2019   CREATININE 8.30 (H) 02/23/2019   BILITOT 0.4 02/20/2019   ALKPHOS 91 02/20/2019   AST 42 (H) 02/20/2019   ALT 41 02/20/2019   PROT 6.7 02/20/2019   ALBUMIN 2.6 (L) 02/23/2019   CALCIUM 8.7 (L) 02/23/2019   ANIONGAP 13 02/23/2019   GFR 46.55 (L) 07/16/2015   Lab Results  Component Value Date   CHOL 175 05/10/2017   Lab Results  Component Value Date   HDL 35 (L) 05/10/2017   Lab Results  Component Value Date   LDLCALC 69 05/10/2017   Lab Results  Component Value Date   TRIG 353 (H) 05/10/2017   Lab Results  Component Value Date   CHOLHDL 5.0 05/10/2017   Lab Results  Component Value Date   HGBA1C 6.4 (A) 12/09/2018      Assessment & Plan:   Problem List Items Addressed This Visit      Cardiovascular and Mediastinum   Essential hypertension, malignant     Other   Chronic recurrent major depressive disorder (HCC)    Mood is stable, currently sober        Other Visit Diagnoses    Diabetes mellitus due to underlying condition, uncontrolled, with  severe nonproliferative retinopathy, with long-term current use of insulin ([place])    -  Primary  Continue with Nephrology and Endocrinology   Relevant Medications   dextrose (GLUTOSE) 40 % GEL   Class 2 severe obesity due to excess calories with serious comorbidity and body mass index (BMI) of 37.0 to 37.9 in adult (HCC)   (Chronic)   -   Weight stable, continue to try to lose weight   Relevant Medications   dextrose (GLUTOSE) 40 % GEL   Severe diabetic hypoglycemia (HCC)    -  Gave dextrose gel for severe hypoglycemia in the event of hypoglycemia his roommate can give it to him   Relevant Medications   dextrose (  GLUTOSE) 40 % GEL      Meds ordered this encounter  Medications  . Continuous Blood Gluc Receiver (FREESTYLE LIBRE 2 READER SYSTM) DEVI    Sig: 1 Device by Does not apply route as directed.    Dispense:  1 Device    Refill:  0  . DISCONTD: Continuous Blood Gluc Receiver (FREESTYLE LIBRE 14 DAY READER) DEVI    Sig: 1 application by Does not apply route 3 (three) times daily. Use to check blood glucoses    Dispense:  2 Device    Refill:  11  . dextrose (GLUTOSE) 40 % GEL    Sig: Take 37.5 g by mouth once as needed for up to 1 dose for low blood sugar.    Dispense:  37.5 g    Refill:  1  . silver sulfADIAZINE (SILVADENE) 1 % cream    Sig: Apply 1 application topically daily.    Dispense:  50 g    Refill:  0  . bacitracin-polymyxin b (POLYSPORIN) ointment    Sig: Apply topically 2 (two) times daily.    Dispense:  15 g    Refill:  0    Follow-up: Return in about 3 months (around 06/20/2019) for dialysis, hypoglycemia.    Forrest Moron, MD

## 2019-03-20 NOTE — Patient Instructions (Addendum)
   If you have lab work done today you will be contacted with your lab results within the next 2 weeks.  If you have not heard from us then please contact us. The fastest way to get your results is to register for My Chart.   IF you received an x-ray today, you will receive an invoice from [facility]. Please contact [facility] at [phone] with questions or concerns regarding your invoice.   IF you received labwork today, you will receive an invoice from [facility]. Please contact [facility] at [phone] with questions or concerns regarding your invoice.   Our billing staff will not be able to assist you with questions regarding bills from these companies.  You will be contacted with the lab results as soon as they are available. The fastest way to get your results is to activate your My Chart account. Instructions are located on the last page of this paperwork. If you have not heard from us regarding the results in 2 weeks, please contact this office.     Preventing Hypoglycemia Hypoglycemia occurs when the level of sugar (glucose) in the blood is too low. Hypoglycemia can happen in people who do or do not have diabetes (diabetes mellitus). It can develop quickly, and it can be a medical emergency. For most people with diabetes, a blood glucose level below 70 mg/dL (3.9 mmol/L) is considered hypoglycemia. Glucose is a type of sugar that provides the body's main source of energy. Certain hormones (insulin and glucagon) control the level of glucose in the blood. Insulin lowers blood glucose, and glucagon increases blood glucose. Hypoglycemia can result from having too much insulin in the bloodstream, or from not eating enough food that contains glucose. Your risk for hypoglycemia is higher:  If you take insulin or diabetes medicines to help lower your blood glucose or help your body make more insulin.  If you skip or delay a meal or snack.  If you are ill.  During  and after exercise. You can prevent hypoglycemia by working with your health care provider to adjust your meal plan as needed and by taking other precautions. How can hypoglycemia affect me? Mild symptoms Mild hypoglycemia may not cause any symptoms. If you do have symptoms, they may include:  Hunger.  Anxiety.  Sweating and feeling clammy.  Dizziness or feeling light-headed.  Sleepiness.  Nausea.  Increased heart rate.  Headache.  Blurry vision.  Irritability.  Tingling or numbness around the mouth, lips, or tongue.  A change in coordination.  Restless sleep. If mild hypoglycemia is not recognized and treated, it can quickly become moderate or severe hypoglycemia. Moderate symptoms Moderate hypoglycemia can cause:  Mental confusion and poor judgment.  Behavior changes.  Weakness.  Irregular heartbeat. Severe symptoms Severe hypoglycemia is a medical emergency. It can cause:  Fainting.  Seizures.  Loss of consciousness (coma).  Death. What nutrition changes can be made?  Work with your health care provider or diet and nutrition specialist (dietitian) to make a healthy meal plan that is right for you. Follow your meal plan carefully.  Eat meals at regular times.  If recommended by your health care provider, have snacks between meals.  Donot skip or delay meals or snacks. You can be at risk for hypoglycemia if you are not getting enough carbohydrates. What lifestyle changes can be made?   Work closely with your health care provider to manage your blood glucose. Make sure you know: ? Your goal blood glucose levels. ?   How and when to check your blood glucose. ? The symptoms of hypoglycemia. It is important to treat it right away to keep it from becoming severe.  Do not drink alcohol on an empty stomach.  When you are ill, check your blood glucose more often than usual. Follow your sick day plan whenever you cannot eat or drink normally. Make this  plan in advance with your health care provider.  Always check your blood glucose before, during, and after exercise. How is this treated? This condition can often be treated by immediately eating or drinking something that contains sugar, such as:  Fruit juice, 4-6 oz (120-150 mL).  Regular (not diet) soda, 4-6 oz (120-150 mL).  Low-fat milk, 4 oz (120 mL).  Several pieces of hard candy.  Sugar or honey, 1 Tbsp (15 mL). Treating hypoglycemia if you have diabetes If you are alert and able to swallow safely, follow the 15:15 rule:  Take 15 grams of a rapid-acting carbohydrate. Talk with your health care provider about how much you should take.  Rapid-acting options include: ? Glucose pills (take 15 grams). ? 6-8 pieces of hard candy. ? 4-6 oz (120-150 mL) of fruit juice. ? 4-6 oz (120-150 mL) of regular (not diet) soda.  Check your blood glucose 15 minutes after you take the carbohydrate.  If the repeat blood glucose level is still at or below 70 mg/dL (3.9 mmol/L), take 15 grams of a carbohydrate again.  If your blood glucose level does not increase above 70 mg/dL (3.9 mmol/L) after 3 tries, seek emergency medical care.  After your blood glucose level returns to normal, eat a meal or a snack within 1 hour. Treating severe hypoglycemia Severe hypoglycemia is when your blood glucose level is at or below 54 mg/dL (3 mmol/L). Severe hypoglycemia is a medical emergency. Get medical help right away. If you have severe hypoglycemia and you cannot eat or drink, you may need an injection of glucagon. A family member or close friend should learn how to check your blood glucose and how to give you a glucagon injection. Ask your health care provider if you need to have an emergency glucagon injection kit available. Severe hypoglycemia may need to be treated in a hospital. The treatment may include getting glucose through an IV. You may also need treatment for the cause of your  hypoglycemia. Where to find more information  American Diabetes Association: www.diabetes.[employer] of Diabetes and Digestive and Kidney Diseases: [employer] Contact a health care provider if:  You have problems keeping your blood glucose in your target range.  You have frequent episodes of hypoglycemia. Get help right away if:  You continue to have hypoglycemia symptoms after eating or drinking something containing glucose.  Your blood glucose level is at or below 54 mg/dL (3 mmol/L).  You faint.  You have a seizure. These symptoms may represent a serious problem that is an emergency. Do not wait to see if the symptoms will go away. Get medical help right away. Call your local emergency services (911 in the [place] Summary  Know the symptoms of hypoglycemia, and when you are at risk for it (such as during exercise or when you are sick). Check your blood glucose often when you are at risk for hypoglycemia.  Hypoglycemia can develop quickly, and it can be dangerous if it is not treated right away. If you have a history of severe hypoglycemia, make sure you know how to use your glucagon injection kit.  Make sure you know how to treat hypoglycemia. Keep a carbohydrate snack available when you may be at risk for hypoglycemia. This information is not intended to replace advice given to you by your health care provider. Make sure you discuss any questions you have with your health care provider. Document Released: 04/14/2017 Document Revised: 12/09/2018 Document Reviewed: 04/14/2017 Elsevier Patient Education  2020 [employer].

## 2019-03-20 NOTE — Assessment & Plan Note (Signed)
Mood is stable, currently sober

## 2019-03-22 ENCOUNTER — Other Ambulatory Visit (HOSPITAL_COMMUNITY)
Admission: RE | Admit: 2019-03-22 | Discharge: 2019-03-22 | Disposition: A | Discharge status: Home / Self Care | Payer: Medicare Other | Source: Ambulatory Visit | Attending: Pulmonary Disease | Admitting: Pulmonary Disease

## 2019-03-22 DIAGNOSIS — Z992 Dependence on renal dialysis: Secondary | ICD-10-CM | POA: Diagnosis not present

## 2019-03-22 DIAGNOSIS — D689 Coagulation defect, unspecified: Secondary | ICD-10-CM | POA: Diagnosis not present

## 2019-03-22 DIAGNOSIS — Z1159 Encounter for screening for other viral diseases: Secondary | ICD-10-CM | POA: Insufficient documentation

## 2019-03-22 DIAGNOSIS — Z23 Encounter for immunization: Secondary | ICD-10-CM | POA: Diagnosis not present

## 2019-03-22 DIAGNOSIS — D631 Anemia in chronic kidney disease: Secondary | ICD-10-CM | POA: Diagnosis not present

## 2019-03-22 DIAGNOSIS — E1122 Type 2 diabetes mellitus with diabetic chronic kidney disease: Secondary | ICD-10-CM | POA: Diagnosis not present

## 2019-03-22 DIAGNOSIS — N186 End stage renal disease: Secondary | ICD-10-CM | POA: Diagnosis not present

## 2019-03-22 DIAGNOSIS — N2581 Secondary hyperparathyroidism of renal origin: Secondary | ICD-10-CM | POA: Diagnosis not present

## 2019-03-22 DIAGNOSIS — D509 Iron deficiency anemia, unspecified: Secondary | ICD-10-CM | POA: Diagnosis not present

## 2019-03-22 LAB — SARS CORONAVIRUS 2 (TAT 6-24 HRS): SARS Coronavirus 2: NEGATIVE

## 2019-03-23 ENCOUNTER — Other Ambulatory Visit: Payer: Self-pay | Admitting: Family Medicine

## 2019-03-23 DIAGNOSIS — I10 Essential (primary) hypertension: Secondary | ICD-10-CM

## 2019-03-23 MED ORDER — FREESTYLE LIBRE 14 DAY READER DEVI: 1.0000 "application " | Freq: Three times a day (TID) | 11 refills | Status: DC

## 2019-03-23 NOTE — Telephone Encounter (Signed)
Refill sent to [facility]

## 2019-03-23 NOTE — Telephone Encounter (Signed)
Requested Prescriptions   Pending Prescriptions Disp Refills  . hydrALAZINE (APRESOLINE) 100 MG tablet [Pharmacy Med Name: HYDRALAZINE HCL 100 MG TABLET] 270 tablet 0    Sig: TAKE 1 TABLET (100 MG TOTAL) BY MOUTH 3 (THREE) TIMES DAILY.     Last OV 03/20/2019  Last written 11/17/2018

## 2019-03-23 NOTE — Telephone Encounter (Signed)
Requested medication (s) are due for refill today: yes  Requested medication (s) are on the active medication list: yes  Last refill:  11/17/2018  Future visit scheduled: yes  Notes to clinic:  Overdue labs    Requested Prescriptions  Pending Prescriptions Disp Refills   hydrALAZINE (APRESOLINE) 100 MG tablet [Pharmacy Med Name: HYDRALAZINE HCL 100 MG TABLET] 270 tablet 0    Sig: Take 1 tablet (100 mg total) by mouth 3 (three) times daily.     Cardiovascular:  Vasodilators Failed - 03/23/2019  9:07 AM      Failed - HCT in normal range and within 360 days    HCT  Date Value Ref Range Status  02/23/2019 29.8 (L) 39.0 - 52.0 % Final         Failed - HGB in normal range and within 360 days    Hemoglobin  Date Value Ref Range Status  02/23/2019 9.1 (L) 13.0 - 17.0 g/dL Final         Failed - RBC in normal range and within 360 days    RBC  Date Value Ref Range Status  02/23/2019 3.12 (L) 4.22 - 5.81 MIL/uL Final         Failed - PLT in normal range and within 360 days    Platelets  Date Value Ref Range Status  02/23/2019 110 (L) 150 - 400 K/uL Final    Comment:    REPEATED TO VERIFY SPECIMEN CHECKED FOR CLOTS Immature Platelet Fraction may be clinically indicated, consider ordering this additional test [id number] CONSISTENT WITH PREVIOUS RESULT          Failed - Last BP in normal range    BP Readings from Last 1 Encounters:  03/20/19 (!) 146/74         Passed - WBC in normal range and within 360 days    WBC  Date Value Ref Range Status  02/23/2019 4.3 4.0 - 10.5 K/uL Final         Passed - Valid encounter within last 12 months    Recent Outpatient Visits          3 days ago Diabetes mellitus due to underlying condition, uncontrolled, with severe nonproliferative retinopathy, with long-term current use of insulin ([place])   Primary Care at [facility], Arlie Solomons, MD   2 months ago Patient left without being seen   Primary Care at [facility], Rex Kras, MD   5  months ago Drug-induced constipation   Primary Care at [facility], Ranell Patrick, MD   7 months ago Diabetic peripheral neuropathy associated with type 2 diabetes mellitus [facility])   Primary Care at [facility], Zoe A, MD   1 year ago Type 2 diabetes mellitus with complication, without long-term current use of insulin ([place])   Primary Care at [place], MD      Future Appointments            In 2 months Forrest Moron, MD Primary Care at [place], [facility]   In 4 months Parrett, Fonnie Mu, NP [facility]monary Care

## 2019-03-24 DIAGNOSIS — D689 Coagulation defect, unspecified: Secondary | ICD-10-CM | POA: Diagnosis not present

## 2019-03-24 DIAGNOSIS — Z992 Dependence on renal dialysis: Secondary | ICD-10-CM | POA: Diagnosis not present

## 2019-03-24 DIAGNOSIS — D631 Anemia in chronic kidney disease: Secondary | ICD-10-CM | POA: Diagnosis not present

## 2019-03-24 DIAGNOSIS — D509 Iron deficiency anemia, unspecified: Secondary | ICD-10-CM | POA: Diagnosis not present

## 2019-03-24 DIAGNOSIS — N186 End stage renal disease: Secondary | ICD-10-CM | POA: Diagnosis not present

## 2019-03-24 DIAGNOSIS — Z23 Encounter for immunization: Secondary | ICD-10-CM | POA: Diagnosis not present

## 2019-03-24 DIAGNOSIS — N2581 Secondary hyperparathyroidism of renal origin: Secondary | ICD-10-CM | POA: Diagnosis not present

## 2019-03-24 DIAGNOSIS — E1122 Type 2 diabetes mellitus with diabetic chronic kidney disease: Secondary | ICD-10-CM | POA: Diagnosis not present

## 2019-03-25 ENCOUNTER — Ambulatory Visit (HOSPITAL_BASED_OUTPATIENT_CLINIC_OR_DEPARTMENT_OTHER): Payer: Medicare Other | Attending: Acute Care | Admitting: Pulmonary Disease

## 2019-03-25 DIAGNOSIS — G4733 Obstructive sleep apnea (adult) (pediatric): Secondary | ICD-10-CM | POA: Insufficient documentation

## 2019-03-26 ENCOUNTER — Other Ambulatory Visit: Payer: Self-pay

## 2019-03-27 ENCOUNTER — Telehealth: Payer: Self-pay | Admitting: Nurse Practitioner

## 2019-03-27 DIAGNOSIS — D689 Coagulation defect, unspecified: Secondary | ICD-10-CM | POA: Diagnosis not present

## 2019-03-27 DIAGNOSIS — T82858A Stenosis of vascular prosthetic devices, implants and grafts, initial encounter: Secondary | ICD-10-CM | POA: Diagnosis not present

## 2019-03-27 DIAGNOSIS — N186 End stage renal disease: Secondary | ICD-10-CM | POA: Diagnosis not present

## 2019-03-27 DIAGNOSIS — E1122 Type 2 diabetes mellitus with diabetic chronic kidney disease: Secondary | ICD-10-CM | POA: Diagnosis not present

## 2019-03-27 DIAGNOSIS — D509 Iron deficiency anemia, unspecified: Secondary | ICD-10-CM | POA: Diagnosis not present

## 2019-03-27 DIAGNOSIS — N2581 Secondary hyperparathyroidism of renal origin: Secondary | ICD-10-CM | POA: Diagnosis not present

## 2019-03-27 DIAGNOSIS — Z992 Dependence on renal dialysis: Secondary | ICD-10-CM | POA: Diagnosis not present

## 2019-03-27 DIAGNOSIS — D631 Anemia in chronic kidney disease: Secondary | ICD-10-CM | POA: Diagnosis not present

## 2019-03-27 DIAGNOSIS — Z23 Encounter for immunization: Secondary | ICD-10-CM | POA: Diagnosis not present

## 2019-03-27 DIAGNOSIS — I871 Compression of vein: Secondary | ICD-10-CM | POA: Diagnosis not present

## 2019-03-27 NOTE — Telephone Encounter (Signed)
Notes recorded by Burtis Junes, NP on 03/16/2019 at 2:06 PM EDT  Reviewed for Richardson Dopp, PA  Ok to report. Echo with hyperdynamic function - EF>60%.  Still with considerable thickness noted - needs really good BP control.  His valves are doing ok - there is some calcification on the aortic valve - this is probably the etiology of his murmur.  No change with current regimen.  Copy to his nephrologist please.    Pt made aware echo results and recommendations per Truitt Merle NP, covering for [employer].  Pt verbalized understanding and agrees with this plan. A copy will be forwarded to his Nephrologist Dr. Edrick Oh.

## 2019-03-27 NOTE — Telephone Encounter (Signed)
Patient returning call for his Echo results.

## 2019-03-29 ENCOUNTER — Other Ambulatory Visit: Payer: Self-pay | Admitting: Family Medicine

## 2019-03-29 DIAGNOSIS — D631 Anemia in chronic kidney disease: Secondary | ICD-10-CM | POA: Diagnosis not present

## 2019-03-29 DIAGNOSIS — N186 End stage renal disease: Secondary | ICD-10-CM | POA: Diagnosis not present

## 2019-03-29 DIAGNOSIS — D689 Coagulation defect, unspecified: Secondary | ICD-10-CM | POA: Diagnosis not present

## 2019-03-29 DIAGNOSIS — Z23 Encounter for immunization: Secondary | ICD-10-CM | POA: Diagnosis not present

## 2019-03-29 DIAGNOSIS — Z992 Dependence on renal dialysis: Secondary | ICD-10-CM | POA: Diagnosis not present

## 2019-03-29 DIAGNOSIS — D509 Iron deficiency anemia, unspecified: Secondary | ICD-10-CM | POA: Diagnosis not present

## 2019-03-29 DIAGNOSIS — N2581 Secondary hyperparathyroidism of renal origin: Secondary | ICD-10-CM | POA: Diagnosis not present

## 2019-03-29 DIAGNOSIS — E1122 Type 2 diabetes mellitus with diabetic chronic kidney disease: Secondary | ICD-10-CM | POA: Diagnosis not present

## 2019-03-29 NOTE — Telephone Encounter (Signed)
Requested Prescriptions  Pending Prescriptions Disp Refills  . Olopatadine HCl 0.2 % SOLN [Pharmacy Med Name: OLOPATADINE HCL 0.2% EYE SOLUTION] 2.5 mL 2    Sig: APPLY 1 DROP TO EYE DAILY.     Ophthalmology:  Antiallergy Passed - 03/29/2019 10:27 AM      Passed - Valid encounter within last 12 months    Recent Outpatient Visits          1 week ago Diabetes mellitus due to underlying condition, uncontrolled, with severe nonproliferative retinopathy, with long-term current use of insulin ([place])   Primary Care at [place], MD   2 months ago Patient left without being seen   Primary Care at [facility], Rex Kras, MD   5 months ago Drug-induced constipation   Primary Care at [facility], Ranell Patrick, MD   7 months ago Diabetic peripheral neuropathy associated with type 2 diabetes mellitus [facility])   Primary Care at [facility], Zoe A, MD   1 year ago Type 2 diabetes mellitus with complication, without long-term current use of insulin [facility])   Primary Care at [place], MD      Future Appointments            In 2 months Forrest Moron, MD Primary Care at [place], [facility]   In 4 months Parrett, Fonnie Mu, NP [facility] Pulmonary Care

## 2019-03-30 DIAGNOSIS — G894 Chronic pain syndrome: Secondary | ICD-10-CM | POA: Diagnosis not present

## 2019-03-30 DIAGNOSIS — Z79891 Long term (current) use of opiate analgesic: Secondary | ICD-10-CM | POA: Diagnosis not present

## 2019-03-30 DIAGNOSIS — M545 Low back pain: Secondary | ICD-10-CM | POA: Diagnosis not present

## 2019-03-30 DIAGNOSIS — G8929 Other chronic pain: Secondary | ICD-10-CM | POA: Diagnosis not present

## 2019-03-30 DIAGNOSIS — M5431 Sciatica, right side: Secondary | ICD-10-CM | POA: Diagnosis not present

## 2019-03-30 DIAGNOSIS — G890 Central pain syndrome: Secondary | ICD-10-CM | POA: Diagnosis not present

## 2019-03-30 DIAGNOSIS — M5432 Sciatica, left side: Secondary | ICD-10-CM | POA: Diagnosis not present

## 2019-03-31 DIAGNOSIS — N186 End stage renal disease: Secondary | ICD-10-CM | POA: Diagnosis not present

## 2019-03-31 DIAGNOSIS — N2581 Secondary hyperparathyroidism of renal origin: Secondary | ICD-10-CM | POA: Diagnosis not present

## 2019-03-31 DIAGNOSIS — Z992 Dependence on renal dialysis: Secondary | ICD-10-CM | POA: Diagnosis not present

## 2019-03-31 DIAGNOSIS — D509 Iron deficiency anemia, unspecified: Secondary | ICD-10-CM | POA: Diagnosis not present

## 2019-03-31 DIAGNOSIS — D689 Coagulation defect, unspecified: Secondary | ICD-10-CM | POA: Diagnosis not present

## 2019-03-31 DIAGNOSIS — E1122 Type 2 diabetes mellitus with diabetic chronic kidney disease: Secondary | ICD-10-CM | POA: Diagnosis not present

## 2019-03-31 DIAGNOSIS — Z23 Encounter for immunization: Secondary | ICD-10-CM | POA: Diagnosis not present

## 2019-03-31 DIAGNOSIS — D631 Anemia in chronic kidney disease: Secondary | ICD-10-CM | POA: Diagnosis not present

## 2019-04-03 ENCOUNTER — Telehealth: Payer: Self-pay | Admitting: Pulmonary Disease

## 2019-04-03 DIAGNOSIS — N186 End stage renal disease: Secondary | ICD-10-CM | POA: Diagnosis not present

## 2019-04-03 DIAGNOSIS — G4733 Obstructive sleep apnea (adult) (pediatric): Secondary | ICD-10-CM

## 2019-04-03 DIAGNOSIS — Z23 Encounter for immunization: Secondary | ICD-10-CM | POA: Diagnosis not present

## 2019-04-03 DIAGNOSIS — G4730 Sleep apnea, unspecified: Secondary | ICD-10-CM | POA: Diagnosis not present

## 2019-04-03 DIAGNOSIS — N2581 Secondary hyperparathyroidism of renal origin: Secondary | ICD-10-CM | POA: Diagnosis not present

## 2019-04-03 DIAGNOSIS — D631 Anemia in chronic kidney disease: Secondary | ICD-10-CM | POA: Diagnosis not present

## 2019-04-03 DIAGNOSIS — D689 Coagulation defect, unspecified: Secondary | ICD-10-CM | POA: Diagnosis not present

## 2019-04-03 DIAGNOSIS — Z992 Dependence on renal dialysis: Secondary | ICD-10-CM | POA: Diagnosis not present

## 2019-04-03 NOTE — Telephone Encounter (Signed)
Please change his bipap settings to   auto BiPAP with IPAP max 28, EPAP min 15, PS +5 cm  Arrange OV with APP/SG in 6 weeks to review download on new settings - If residual AHI remains higher than 15, can increase EPAP min to 20

## 2019-04-03 NOTE — Procedures (Signed)
Patient Name: Jerry Cantrell, Jerry Cantrell Date: 03/25/2019 Gender: Male D.O.B: 09/23/1965 Age (years): 66 Referring Provider: Magdalen Spatz NP Height (inches): 72 Interpreting Physician: Kara Mead MD, ABSM Weight (lbs): 265 RPSGT: [place], [place] BMI: 36 MRN: [id number] Neck Size: 17.50 <br> <br> CLINICAL INFORMATION The patient is referred for a BiPAP titration to treat sleep apnea.    Date of  Split Night :  01/2015- severe OSA, AHI 99/hour, not corrected by CPAP required BiPAP 25/21 with a full face mask  SLEEP STUDY TECHNIQUE As per the AASM Manual for the Scoring of Sleep and Associated Events v2.3 (April 2016) with a hypopnea requiring 4% desaturations.  The channels recorded and monitored were frontal, central and occipital EEG, electrooculogram (EOG), submentalis EMG (chin), nasal and oral airflow, thoracic and abdominal wall motion, anterior tibialis EMG, snore microphone, electrocardiogram, and pulse oximetry. Bilevel positive airway pressure (BPAP) was initiated at the beginning of the study and titrated to treat sleep-disordered breathing.  MEDICATIONS Medications self-administered by patient taken the night of the study : N/A  RESPIRATORY PARAMETERS Optimal IPAP Pressure (cm): 28 AHI at Optimal Pressure (/hr) 4.7 Optimal EPAP Pressure (cm): 23   Overall Minimal O2 (%): 84.0 Minimal O2 at Optimal Pressure (%): 94.0 SLEEP ARCHITECTURE Start Time: 11:15:18 PM Stop Time: 5:17:18 AM Total Time (min): 362 Total Sleep Time (min): 331.4 Sleep Latency (min): 0.0 Sleep Efficiency (%): 91.5% REM Latency (min): 157.4 WASO (min): 30.6 Stage N1 (%): 14.9% Stage N2 (%): 56.9% Stage N3 (%): 10.7% Stage R (%): 17.5 Supine (%): 87.93 Arousal Index (/hr): 56.5     CARDIAC DATA The 2 lead EKG demonstrated sinus rhythm. The mean heart rate was 66.0 beats per minute. Other EKG findings include: None. LEG MOVEMENT DATA The total Periodic Limb Movements of Sleep (PLMS) were 0. The PLMS  index was 0.0. A PLMS index of <15 is considered normal in adults.  IMPRESSIONS - An optimal biPAP pressure was selected for this patient ( 28 /23 cm of water) - Central sleep apnea was not noted during this titration (CAI = 2.7/h). - Moderete oxygen desaturations were observed during this titration (min O2 = 84.0%). - No snoring was audible during this study. - No cardiac abnormalities were observed during this study. - Clinically significant periodic limb movements were not noted during this study. Arousals associated with PLMs were significant.   DIAGNOSIS - Obstructive Sleep Apnea (327.23 [G47.33 ICD-10])   RECOMMENDATIONS - Trial of BiPAP therapy on 28/23 cm H2O with a Medium size Fisher[employer]Paykel Full Face Mask Simplus mask and heated humidification. Suggest using auto BiPAP with IPAP max 28, EPAP min 15, PS +5 cm - Avoid alcohol, sedatives and other CNS depressants that may worsen sleep apnea and disrupt normal sleep architecture. - Sleep hygiene should be reviewed to assess factors that may improve sleep quality. - Weight management and regular exercise should be initiated or continued. - Return  for re-evaluation after 4 weeks of therapy  Kara Mead MD Board Certified in Sleep medicine   04/03/2019

## 2019-04-03 NOTE — Telephone Encounter (Signed)
Contacted patient by phone regarding new bipap settings recommended by Dr. Elsworth Soho after titration study.  Patient acknowledged understanding and had no questions.  Appt 05/16/19 at 10am scheduled with Geraldo Pitter, NP.  (SG had no available schedules for several weeks in September).  New order placed to [facility] for changes to Bipap per Dr. Elsworth Soho.  Nothing further needed.

## 2019-04-04 ENCOUNTER — Telehealth: Payer: Self-pay | Admitting: Pulmonary Disease

## 2019-04-04 DIAGNOSIS — G4733 Obstructive sleep apnea (adult) (pediatric): Secondary | ICD-10-CM

## 2019-04-04 NOTE — Telephone Encounter (Signed)
I will route this to RA and have him clarify if he meant 25 instead of 28.   Dr. Elsworth Soho please advise, per Sonia Baller from [facility] the max IPAP pressure is 25 in the order you sent 04/03/19 you wanted max IPAP pressure to be 28. (which cannot be achieved by home BiPAP)

## 2019-04-04 NOTE — Telephone Encounter (Signed)
New order has been placed. 

## 2019-04-04 NOTE — Telephone Encounter (Signed)
He actually needed IPAP max of 28 but OK to set at max 25 & download can be reviewed in 4 weeks

## 2019-04-04 NOTE — Telephone Encounter (Signed)
Order was placed yesterday by RA for a change in bipap settings.  I sent the order to [facility] and received below message from [place] -  the order will need to be revised as the pressure setting for IPAP is not something that can be achieved by a home BIPAP machine. Max IPAP pressure is 25.  Please advise when new Rx is available.

## 2019-04-05 ENCOUNTER — Telehealth: Payer: Self-pay | Admitting: Acute Care

## 2019-04-05 DIAGNOSIS — N186 End stage renal disease: Secondary | ICD-10-CM | POA: Diagnosis not present

## 2019-04-05 DIAGNOSIS — Z992 Dependence on renal dialysis: Secondary | ICD-10-CM | POA: Diagnosis not present

## 2019-04-05 DIAGNOSIS — N2581 Secondary hyperparathyroidism of renal origin: Secondary | ICD-10-CM | POA: Diagnosis not present

## 2019-04-05 DIAGNOSIS — D689 Coagulation defect, unspecified: Secondary | ICD-10-CM | POA: Diagnosis not present

## 2019-04-05 DIAGNOSIS — D631 Anemia in chronic kidney disease: Secondary | ICD-10-CM | POA: Diagnosis not present

## 2019-04-05 DIAGNOSIS — Z23 Encounter for immunization: Secondary | ICD-10-CM | POA: Diagnosis not present

## 2019-04-05 NOTE — Telephone Encounter (Signed)
Jonelle Sidle, Will you make sure these new BiPAP settings ( See below)  have been sent to the patients DME, and set them up for a follow up visit with down load 4 weeks after the changes have been made? Thanks so much!!  RECOMMENDATIONS - Trial of BiPAP therapy on 28/23 cm H2O with a Medium size Fisher&Paykel Full Face Mask Simplus mask and heated humidification. Suggest using auto BiPAP with IPAP max 28, EPAP min 15, PS +5 cm - Avoid alcohol, sedatives and other CNS depressants that may worsen sleep apnea and disrupt normal sleep architecture. - Sleep hygiene should be reviewed to assess factors that may improve sleep quality. - Weight management and regular exercise should be initiated or continued. - Return  for re-evaluation after 4 weeks of therapy

## 2019-04-06 NOTE — Telephone Encounter (Signed)
Yes the order placed on 8/4 to [place].

## 2019-04-07 DIAGNOSIS — D689 Coagulation defect, unspecified: Secondary | ICD-10-CM | POA: Diagnosis not present

## 2019-04-07 DIAGNOSIS — N2581 Secondary hyperparathyroidism of renal origin: Secondary | ICD-10-CM | POA: Diagnosis not present

## 2019-04-07 DIAGNOSIS — D631 Anemia in chronic kidney disease: Secondary | ICD-10-CM | POA: Diagnosis not present

## 2019-04-07 DIAGNOSIS — N186 End stage renal disease: Secondary | ICD-10-CM | POA: Diagnosis not present

## 2019-04-07 DIAGNOSIS — Z992 Dependence on renal dialysis: Secondary | ICD-10-CM | POA: Diagnosis not present

## 2019-04-07 DIAGNOSIS — Z23 Encounter for immunization: Secondary | ICD-10-CM | POA: Diagnosis not present

## 2019-04-10 DIAGNOSIS — N2581 Secondary hyperparathyroidism of renal origin: Secondary | ICD-10-CM | POA: Diagnosis not present

## 2019-04-10 DIAGNOSIS — Z992 Dependence on renal dialysis: Secondary | ICD-10-CM | POA: Diagnosis not present

## 2019-04-10 DIAGNOSIS — Z23 Encounter for immunization: Secondary | ICD-10-CM | POA: Diagnosis not present

## 2019-04-10 DIAGNOSIS — D631 Anemia in chronic kidney disease: Secondary | ICD-10-CM | POA: Diagnosis not present

## 2019-04-10 DIAGNOSIS — D689 Coagulation defect, unspecified: Secondary | ICD-10-CM | POA: Diagnosis not present

## 2019-04-10 DIAGNOSIS — N186 End stage renal disease: Secondary | ICD-10-CM | POA: Diagnosis not present

## 2019-04-11 ENCOUNTER — Other Ambulatory Visit: Payer: Self-pay | Admitting: *Deleted

## 2019-04-11 ENCOUNTER — Encounter: Payer: Self-pay | Admitting: *Deleted

## 2019-04-11 DIAGNOSIS — H3582 Retinal ischemia: Secondary | ICD-10-CM | POA: Diagnosis not present

## 2019-04-11 DIAGNOSIS — H47011 Ischemic optic neuropathy, right eye: Secondary | ICD-10-CM | POA: Diagnosis not present

## 2019-04-11 DIAGNOSIS — E113512 Type 2 diabetes mellitus with proliferative diabetic retinopathy with macular edema, left eye: Secondary | ICD-10-CM | POA: Diagnosis not present

## 2019-04-11 DIAGNOSIS — Z961 Presence of intraocular lens: Secondary | ICD-10-CM | POA: Diagnosis not present

## 2019-04-11 DIAGNOSIS — H35372 Puckering of macula, left eye: Secondary | ICD-10-CM | POA: Diagnosis not present

## 2019-04-11 NOTE — Patient Outreach (Signed)
Outreach call to Jerry Cantrell for telephone assessment, spoke with Jerry Cantrell, HIPAA verified, Jerry Cantrell reports he continues checking CBG daily with most readings in low 100's with occasional readings near 200 depending on what Jerry Cantrell eats. Jerry Cantrell reports dialysis "going well, getting more used to it"  Jerry Cantrell reports weight range 263-265 pounds, reports has all medications and taking as prescribed.  No new concerns voiced.  THN CM Care Plan Problem One     Most Recent Value  Care Plan Problem One  Knowledge deficit related to diabetes  Role Documenting the Problem One  Care Management Coordinator  Care Plan for Problem One  Active  THN Long Term Goal   Jerry Cantrell will demonstrate/ verbalize improved self care related to diabetes within 60 days aeb decrease Hgb AIC 1-2 points  THN Long Term Goal Start Date  02/28/19  Interventions for Problem One Long Term Goal  RN CM reinforced plan of care with Jerry Cantrell,  Jerry Cantrell continues dialysis 3 x week with no recent issues, reports having all medications and taking as prescribed  THN CM Short Term Goal #1   Jerry Cantrell will verbalize plate method and practice portion control within 30 days  THN CM Short Term Goal #1 Start Date  04/11/19 [goal re-established]  Interventions for Short Term Goal #1  RN CM reviewed plate method, food choices including adequate protein in diet, being mindful of carbohydrate intake and portion control.  Patient's weight has not changed.    THN CM Care Plan Problem Two     Most Recent Value  Care Plan Problem Two  End stage renal disease (new to dialysis)  Role Documenting the Problem Two  Care Management Coordinator  Care Plan for Problem Two  Active  THN CM Short Term Goal #1   Jerry Cantrell will verbalize interventions for positive health changes/ actions to facilitate successful dialysis/ goals within 30 days  THN CM Short Term Goal #1 Start Date  02/28/19  [facility] CM Short Term Goal #1 Met Date   04/11/19  Interventions for Short Term Goal #2   Jerry Cantrell is attending all dialysis sessions with no recent  issues and no further issues with stent recently placed,.     PLAN Outreach Jerry Cantrell next month for telephone assessment  Jacqlyn Larsen [facility], [place] Coordinator [phone]

## 2019-04-12 ENCOUNTER — Other Ambulatory Visit: Payer: Self-pay | Admitting: Family Medicine

## 2019-04-12 DIAGNOSIS — Z23 Encounter for immunization: Secondary | ICD-10-CM | POA: Diagnosis not present

## 2019-04-12 DIAGNOSIS — D631 Anemia in chronic kidney disease: Secondary | ICD-10-CM | POA: Diagnosis not present

## 2019-04-12 DIAGNOSIS — N2581 Secondary hyperparathyroidism of renal origin: Secondary | ICD-10-CM | POA: Diagnosis not present

## 2019-04-12 DIAGNOSIS — Z992 Dependence on renal dialysis: Secondary | ICD-10-CM | POA: Diagnosis not present

## 2019-04-12 DIAGNOSIS — N186 End stage renal disease: Secondary | ICD-10-CM | POA: Diagnosis not present

## 2019-04-12 DIAGNOSIS — D689 Coagulation defect, unspecified: Secondary | ICD-10-CM | POA: Diagnosis not present

## 2019-04-12 NOTE — Telephone Encounter (Signed)
Please advise if Dr. Nolon Rod will order

## 2019-04-14 ENCOUNTER — Other Ambulatory Visit: Payer: Self-pay | Admitting: Nephrology

## 2019-04-14 DIAGNOSIS — N2581 Secondary hyperparathyroidism of renal origin: Secondary | ICD-10-CM | POA: Diagnosis not present

## 2019-04-14 DIAGNOSIS — Z992 Dependence on renal dialysis: Secondary | ICD-10-CM | POA: Diagnosis not present

## 2019-04-14 DIAGNOSIS — N186 End stage renal disease: Secondary | ICD-10-CM

## 2019-04-14 DIAGNOSIS — D631 Anemia in chronic kidney disease: Secondary | ICD-10-CM | POA: Diagnosis not present

## 2019-04-14 DIAGNOSIS — D689 Coagulation defect, unspecified: Secondary | ICD-10-CM | POA: Diagnosis not present

## 2019-04-14 DIAGNOSIS — Z23 Encounter for immunization: Secondary | ICD-10-CM | POA: Diagnosis not present

## 2019-04-14 DIAGNOSIS — N280 Ischemia and infarction of kidney: Secondary | ICD-10-CM

## 2019-04-17 ENCOUNTER — Telehealth: Payer: Self-pay | Admitting: Adult Health

## 2019-04-17 DIAGNOSIS — N186 End stage renal disease: Secondary | ICD-10-CM | POA: Diagnosis not present

## 2019-04-17 DIAGNOSIS — J449 Chronic obstructive pulmonary disease, unspecified: Secondary | ICD-10-CM

## 2019-04-17 DIAGNOSIS — Z992 Dependence on renal dialysis: Secondary | ICD-10-CM | POA: Diagnosis not present

## 2019-04-17 DIAGNOSIS — N2581 Secondary hyperparathyroidism of renal origin: Secondary | ICD-10-CM | POA: Diagnosis not present

## 2019-04-17 DIAGNOSIS — D689 Coagulation defect, unspecified: Secondary | ICD-10-CM | POA: Diagnosis not present

## 2019-04-17 DIAGNOSIS — D631 Anemia in chronic kidney disease: Secondary | ICD-10-CM | POA: Diagnosis not present

## 2019-04-17 DIAGNOSIS — Z23 Encounter for immunization: Secondary | ICD-10-CM | POA: Diagnosis not present

## 2019-04-17 NOTE — Telephone Encounter (Signed)
Please advise on refill. Was last seen 03/2019, however the medication was not addressed at that visit.

## 2019-04-17 NOTE — Telephone Encounter (Signed)
Called and spoke w/ pt. Pt states he cannot get surgical clearance for a kidney transplant because he is currently on BiPAP. I confirmed w/ pt that he is now on BiPAP after a recent titration study done in July 2020. Pt states he doesn't understand why he cannot have this surgery because he has lost the weight he needed to lose. He states he will need to be back on CPAP in order to undergo the procedure. I let him know I would get this message routed to a provider and since it is after 5:00 PM EDT, we would get back with him tomorrow. Pt verbalized understanding with no additional questions.   Since RA is not currently in the office, I am routing this message to TP, who has seen pt in the past. TP, please advise with your recommendations for this pt. Thank you.

## 2019-04-18 NOTE — Telephone Encounter (Signed)
I called pt, he is going to call me back with their number so I can call and get mor einformation. Will await return call.

## 2019-04-18 NOTE — Telephone Encounter (Signed)
Im sorry I do not know why this would be the case He is on BIPAP for Sleep Apnea.  Will need to contact transplant team to discuss the reason behind this or get [place] information from transplant team . I do not see anything in care everywhere.

## 2019-04-18 NOTE — Telephone Encounter (Signed)
Called number listed below and was transferred to the referral nurse, Elmyra Ricks. I left a detailed message for Elmyra Ricks to return our call.

## 2019-04-18 NOTE — Telephone Encounter (Signed)
Pt called back with number to case worker at the [facility] [phone].

## 2019-04-19 ENCOUNTER — Other Ambulatory Visit: Payer: Self-pay | Admitting: Nephrology

## 2019-04-19 DIAGNOSIS — N2581 Secondary hyperparathyroidism of renal origin: Secondary | ICD-10-CM | POA: Diagnosis not present

## 2019-04-19 DIAGNOSIS — N280 Ischemia and infarction of kidney: Secondary | ICD-10-CM

## 2019-04-19 DIAGNOSIS — Z992 Dependence on renal dialysis: Secondary | ICD-10-CM | POA: Diagnosis not present

## 2019-04-19 DIAGNOSIS — D631 Anemia in chronic kidney disease: Secondary | ICD-10-CM | POA: Diagnosis not present

## 2019-04-19 DIAGNOSIS — N186 End stage renal disease: Secondary | ICD-10-CM | POA: Diagnosis not present

## 2019-04-19 DIAGNOSIS — D689 Coagulation defect, unspecified: Secondary | ICD-10-CM | POA: Diagnosis not present

## 2019-04-19 DIAGNOSIS — Z23 Encounter for immunization: Secondary | ICD-10-CM | POA: Diagnosis not present

## 2019-04-19 NOTE — Telephone Encounter (Signed)
I believe the call from Elmyra Ricks is a return from [place] , no need to call her back right now.   I have received letter from [facility], to the rejection of kidney transplant for patient. IT states due to BMI and pulmonary function he is not a good candidate for transplant.   Patients Last OV was on 03/09/19 with SG BMI: 36.97, patient states in previous message he has lost weight.   Patients last PFT was 10/28/15, so might need an updated one.   Letter states patient is eligible for re-referral in 6 months. I think from all conversations today and this letter, patient is confused and thinks he was denied due to the BiPap, which isn't the case. I also believe that with a new PFT and continued weight loss information could be sent back to Dr. Jason Nest office to re-refer him to the transplant team at [facility].   Dr. Elsworth Soho I have placed this letter and information on your desk.  Please advise on next step so we can let the patient know a plan

## 2019-04-19 NOTE — Telephone Encounter (Signed)
[facility] (option 6). Spoke with Otila Kluver, she mentioned they didn't refer patient to our office. She also states that a BiPAP would not keep him from receiving the surgery. Patient needs to do 1 or 2 more drug screen urine tests from their side of things; but nothing from them states he can't have the surgery due to BiPAP.  Otila Kluver states patient see Dr. Justin Mend in [place] and they might have referred patient.   Called Dr. Hassell Done Webb's office [phone], was told [facility] referred patient.   [facility] [phone]. Spoke with Lelon Frohlich and she said she didn't refer him either. However, after speaking with the Education officer, museum; Lelon Frohlich mentioned there was a letter of denial sent to the patient.  I asked if Lelon Frohlich could please send that letter to our office so our Provider could take a look at it.   According to Transplant, Dr. Jason Nest office, and [facility] no one has referred him to our office for surgical clearance.   Will hold open note and await the letter.

## 2019-04-19 NOTE — Telephone Encounter (Signed)
Elmyra Ricks [facility] returning call.  [phone]

## 2019-04-20 ENCOUNTER — Ambulatory Visit
Admission: RE | Admit: 2019-04-20 | Discharge: 2019-04-20 | Disposition: A | Discharge status: Home / Self Care | Payer: Medicare Other | Source: Ambulatory Visit | Attending: Nephrology | Admitting: Nephrology

## 2019-04-20 DIAGNOSIS — N280 Ischemia and infarction of kidney: Secondary | ICD-10-CM

## 2019-04-20 DIAGNOSIS — N186 End stage renal disease: Secondary | ICD-10-CM | POA: Diagnosis not present

## 2019-04-20 NOTE — Telephone Encounter (Signed)
I have reviewed the transplant rejection letter.  His candidacy was turned down due to his BMI and pulmonary function He will be eligible for rereferral in February 2021 His last PFTs was in 2017-showed mostly restrictive lung disease, weight was 292 pounds. I would suggest repeating PFTs in January and then his nephrologist can re-refer him for transplant if these are better BiPAP usage is not a contraindication for transplant

## 2019-04-20 NOTE — Telephone Encounter (Signed)
Still waiting on reply from Dr. Elsworth Soho.

## 2019-04-20 NOTE — Telephone Encounter (Signed)
Received call from Jeanie Cooks [facility] transplant referral nurse (office #: [phone]). She states the Bipap does influence the decision of moving forward with a transplant. She stated, 'the physicians dont like when a pt is on bipap as it tells them their lungs arent strong enough'. I advised her the pt is only on the pap at night for OSA. She stated it was also due to pt still smoking, BMI and PFT data. I asked if pts PFT numbers and BMI and not smoking would the pt be able to have the transplant still being on the Bipap, she states she doesn't know their physicians would need to talk and decide on it.   I called and spoke to the pt. Informed him of the above and the recs per RA. Pt is agree able to the PFT and going from there. Pt is aware he must stop smoking and continue to work on weight loss. Order was placed for PFT, pt aware he will be contacted to get the test scheduled. Will forward message to [facility] to get PFT scheduled.

## 2019-04-20 NOTE — Telephone Encounter (Signed)
Called spoke with patient to discuss Dr Bari Mantis response as stated below  Patient verbalized understanding but continued to emphasize that a gentleman that he has spoken with on several occasions has told him that he does not qualify for kidney transplant because he is on BiPAP.  Patient asked that this office call the transplant team again, press option 3 to speak with transplant coordinator that handles patient's with last name H-O.  Patient asked that we "figure out what he needs to do; go back on CPAP, lose 7 more pounds, whatever" so that he may proceed with this process.  Followed patient's instructions and had to leave a detailed message - asked for the transplant coordinator for patient to call the office and that he stated he's spoken with a gentleman that told him he does not qualify because of BiPAP.    Patient is aware that we will call him back once we figure things out for him.

## 2019-04-21 DIAGNOSIS — Z992 Dependence on renal dialysis: Secondary | ICD-10-CM | POA: Diagnosis not present

## 2019-04-21 DIAGNOSIS — N186 End stage renal disease: Secondary | ICD-10-CM | POA: Diagnosis not present

## 2019-04-21 DIAGNOSIS — Z23 Encounter for immunization: Secondary | ICD-10-CM | POA: Diagnosis not present

## 2019-04-21 DIAGNOSIS — N2581 Secondary hyperparathyroidism of renal origin: Secondary | ICD-10-CM | POA: Diagnosis not present

## 2019-04-21 DIAGNOSIS — D631 Anemia in chronic kidney disease: Secondary | ICD-10-CM | POA: Diagnosis not present

## 2019-04-21 DIAGNOSIS — G4733 Obstructive sleep apnea (adult) (pediatric): Secondary | ICD-10-CM | POA: Diagnosis not present

## 2019-04-21 DIAGNOSIS — D689 Coagulation defect, unspecified: Secondary | ICD-10-CM | POA: Diagnosis not present

## 2019-04-21 NOTE — Telephone Encounter (Signed)
No new appts for PFT. Will hold encounter open for follow up.

## 2019-04-24 ENCOUNTER — Other Ambulatory Visit: Payer: Self-pay | Admitting: Family Medicine

## 2019-04-24 DIAGNOSIS — D631 Anemia in chronic kidney disease: Secondary | ICD-10-CM | POA: Diagnosis not present

## 2019-04-24 DIAGNOSIS — Z23 Encounter for immunization: Secondary | ICD-10-CM | POA: Diagnosis not present

## 2019-04-24 DIAGNOSIS — N186 End stage renal disease: Secondary | ICD-10-CM | POA: Diagnosis not present

## 2019-04-24 DIAGNOSIS — IMO0002 Reserved for concepts with insufficient information to code with codable children: Secondary | ICD-10-CM

## 2019-04-24 DIAGNOSIS — D689 Coagulation defect, unspecified: Secondary | ICD-10-CM | POA: Diagnosis not present

## 2019-04-24 DIAGNOSIS — Z992 Dependence on renal dialysis: Secondary | ICD-10-CM | POA: Diagnosis not present

## 2019-04-24 DIAGNOSIS — E083499 Diabetes mellitus due to underlying condition with severe nonproliferative diabetic retinopathy without macular edema, unspecified eye: Secondary | ICD-10-CM

## 2019-04-24 DIAGNOSIS — N2581 Secondary hyperparathyroidism of renal origin: Secondary | ICD-10-CM | POA: Diagnosis not present

## 2019-04-24 NOTE — Telephone Encounter (Signed)
At this time there is no PFT scheduled for Patient.

## 2019-04-25 DIAGNOSIS — L011 Impetiginization of other dermatoses: Secondary | ICD-10-CM | POA: Diagnosis not present

## 2019-04-25 DIAGNOSIS — L720 Epidermal cyst: Secondary | ICD-10-CM | POA: Diagnosis not present

## 2019-04-25 DIAGNOSIS — L0889 Other specified local infections of the skin and subcutaneous tissue: Secondary | ICD-10-CM | POA: Diagnosis not present

## 2019-04-25 DIAGNOSIS — L309 Dermatitis, unspecified: Secondary | ICD-10-CM | POA: Diagnosis not present

## 2019-04-26 DIAGNOSIS — Z992 Dependence on renal dialysis: Secondary | ICD-10-CM | POA: Diagnosis not present

## 2019-04-26 DIAGNOSIS — D689 Coagulation defect, unspecified: Secondary | ICD-10-CM | POA: Diagnosis not present

## 2019-04-26 DIAGNOSIS — D631 Anemia in chronic kidney disease: Secondary | ICD-10-CM | POA: Diagnosis not present

## 2019-04-26 DIAGNOSIS — N186 End stage renal disease: Secondary | ICD-10-CM | POA: Diagnosis not present

## 2019-04-26 DIAGNOSIS — Z23 Encounter for immunization: Secondary | ICD-10-CM | POA: Diagnosis not present

## 2019-04-26 DIAGNOSIS — N2581 Secondary hyperparathyroidism of renal origin: Secondary | ICD-10-CM | POA: Diagnosis not present

## 2019-04-26 NOTE — Telephone Encounter (Signed)
Patient is returning call. CB is [phone]

## 2019-04-26 NOTE — Telephone Encounter (Signed)
Attempted to call patient to scheduled PFT, no answer, left message to call back.

## 2019-04-26 NOTE — Telephone Encounter (Signed)
Message routed for PFT scheduling.

## 2019-04-27 NOTE — Telephone Encounter (Signed)
Scheduled pft on 05/16/2019-pr

## 2019-04-28 ENCOUNTER — Telehealth: Payer: Self-pay | Admitting: Family Medicine

## 2019-04-28 DIAGNOSIS — N186 End stage renal disease: Secondary | ICD-10-CM | POA: Diagnosis not present

## 2019-04-28 DIAGNOSIS — D631 Anemia in chronic kidney disease: Secondary | ICD-10-CM | POA: Diagnosis not present

## 2019-04-28 DIAGNOSIS — N2581 Secondary hyperparathyroidism of renal origin: Secondary | ICD-10-CM | POA: Diagnosis not present

## 2019-04-28 DIAGNOSIS — D689 Coagulation defect, unspecified: Secondary | ICD-10-CM | POA: Diagnosis not present

## 2019-04-28 DIAGNOSIS — Z992 Dependence on renal dialysis: Secondary | ICD-10-CM | POA: Diagnosis not present

## 2019-04-28 DIAGNOSIS — Z23 Encounter for immunization: Secondary | ICD-10-CM | POA: Diagnosis not present

## 2019-04-28 NOTE — Telephone Encounter (Signed)
Patient called and will like someone to call him concerning papers that were faxed over for the provider to fill out  [phone]

## 2019-05-01 DIAGNOSIS — E1122 Type 2 diabetes mellitus with diabetic chronic kidney disease: Secondary | ICD-10-CM | POA: Diagnosis not present

## 2019-05-01 DIAGNOSIS — D631 Anemia in chronic kidney disease: Secondary | ICD-10-CM | POA: Diagnosis not present

## 2019-05-01 DIAGNOSIS — Z23 Encounter for immunization: Secondary | ICD-10-CM | POA: Diagnosis not present

## 2019-05-01 DIAGNOSIS — Z992 Dependence on renal dialysis: Secondary | ICD-10-CM | POA: Diagnosis not present

## 2019-05-01 DIAGNOSIS — N2581 Secondary hyperparathyroidism of renal origin: Secondary | ICD-10-CM | POA: Diagnosis not present

## 2019-05-01 DIAGNOSIS — N186 End stage renal disease: Secondary | ICD-10-CM | POA: Diagnosis not present

## 2019-05-01 DIAGNOSIS — D689 Coagulation defect, unspecified: Secondary | ICD-10-CM | POA: Diagnosis not present

## 2019-05-01 NOTE — Telephone Encounter (Signed)
Home Health called to inform that faxed pprwrk was incomplete and is resending the pprwrk now to be filled entirely. Please advise

## 2019-05-02 DIAGNOSIS — G890 Central pain syndrome: Secondary | ICD-10-CM | POA: Diagnosis not present

## 2019-05-02 DIAGNOSIS — G894 Chronic pain syndrome: Secondary | ICD-10-CM | POA: Diagnosis not present

## 2019-05-02 DIAGNOSIS — M5432 Sciatica, left side: Secondary | ICD-10-CM | POA: Diagnosis not present

## 2019-05-02 DIAGNOSIS — M545 Low back pain: Secondary | ICD-10-CM | POA: Diagnosis not present

## 2019-05-02 DIAGNOSIS — G8929 Other chronic pain: Secondary | ICD-10-CM | POA: Diagnosis not present

## 2019-05-02 DIAGNOSIS — M5431 Sciatica, right side: Secondary | ICD-10-CM | POA: Diagnosis not present

## 2019-05-02 DIAGNOSIS — Z79891 Long term (current) use of opiate analgesic: Secondary | ICD-10-CM | POA: Diagnosis not present

## 2019-05-03 DIAGNOSIS — D689 Coagulation defect, unspecified: Secondary | ICD-10-CM | POA: Diagnosis not present

## 2019-05-03 DIAGNOSIS — Z23 Encounter for immunization: Secondary | ICD-10-CM | POA: Diagnosis not present

## 2019-05-03 DIAGNOSIS — N2581 Secondary hyperparathyroidism of renal origin: Secondary | ICD-10-CM | POA: Diagnosis not present

## 2019-05-03 DIAGNOSIS — Z992 Dependence on renal dialysis: Secondary | ICD-10-CM | POA: Diagnosis not present

## 2019-05-03 DIAGNOSIS — N186 End stage renal disease: Secondary | ICD-10-CM | POA: Diagnosis not present

## 2019-05-03 NOTE — Telephone Encounter (Signed)
Sent message to [place] about home health form in her red folder in her box.  Dgaddy, CMA

## 2019-05-04 DIAGNOSIS — Z23 Encounter for immunization: Secondary | ICD-10-CM | POA: Diagnosis not present

## 2019-05-04 DIAGNOSIS — N2581 Secondary hyperparathyroidism of renal origin: Secondary | ICD-10-CM | POA: Diagnosis not present

## 2019-05-04 DIAGNOSIS — N186 End stage renal disease: Secondary | ICD-10-CM | POA: Diagnosis not present

## 2019-05-04 DIAGNOSIS — E8779 Other fluid overload: Secondary | ICD-10-CM | POA: Diagnosis not present

## 2019-05-04 DIAGNOSIS — D689 Coagulation defect, unspecified: Secondary | ICD-10-CM | POA: Diagnosis not present

## 2019-05-04 DIAGNOSIS — Z992 Dependence on renal dialysis: Secondary | ICD-10-CM | POA: Diagnosis not present

## 2019-05-05 DIAGNOSIS — D689 Coagulation defect, unspecified: Secondary | ICD-10-CM | POA: Diagnosis not present

## 2019-05-05 DIAGNOSIS — Z992 Dependence on renal dialysis: Secondary | ICD-10-CM | POA: Diagnosis not present

## 2019-05-05 DIAGNOSIS — N186 End stage renal disease: Secondary | ICD-10-CM | POA: Diagnosis not present

## 2019-05-05 DIAGNOSIS — Z23 Encounter for immunization: Secondary | ICD-10-CM | POA: Diagnosis not present

## 2019-05-05 DIAGNOSIS — N2581 Secondary hyperparathyroidism of renal origin: Secondary | ICD-10-CM | POA: Diagnosis not present

## 2019-05-08 DIAGNOSIS — N2581 Secondary hyperparathyroidism of renal origin: Secondary | ICD-10-CM | POA: Diagnosis not present

## 2019-05-08 DIAGNOSIS — Z992 Dependence on renal dialysis: Secondary | ICD-10-CM | POA: Diagnosis not present

## 2019-05-08 DIAGNOSIS — N186 End stage renal disease: Secondary | ICD-10-CM | POA: Diagnosis not present

## 2019-05-08 DIAGNOSIS — D689 Coagulation defect, unspecified: Secondary | ICD-10-CM | POA: Diagnosis not present

## 2019-05-08 DIAGNOSIS — Z23 Encounter for immunization: Secondary | ICD-10-CM | POA: Diagnosis not present

## 2019-05-09 ENCOUNTER — Other Ambulatory Visit: Payer: Self-pay | Admitting: *Deleted

## 2019-05-09 NOTE — Patient Outreach (Signed)
Outreach call to pt for telephone assessment,  No answer and left voicemail requesting return phone call.  Mailed unsuccessful outreach letter to pt home.  PLAN Outreach pt in 3-4 business days  Jacqlyn Larsen [facility], [place] [phone]

## 2019-05-10 DIAGNOSIS — N2581 Secondary hyperparathyroidism of renal origin: Secondary | ICD-10-CM | POA: Diagnosis not present

## 2019-05-10 DIAGNOSIS — D689 Coagulation defect, unspecified: Secondary | ICD-10-CM | POA: Diagnosis not present

## 2019-05-10 DIAGNOSIS — Z23 Encounter for immunization: Secondary | ICD-10-CM | POA: Diagnosis not present

## 2019-05-10 DIAGNOSIS — N186 End stage renal disease: Secondary | ICD-10-CM | POA: Diagnosis not present

## 2019-05-10 DIAGNOSIS — Z992 Dependence on renal dialysis: Secondary | ICD-10-CM | POA: Diagnosis not present

## 2019-05-11 NOTE — Telephone Encounter (Signed)
Dr Nolon Rod will complete form upon her return from vacation. Dgaddy, CMA

## 2019-05-12 ENCOUNTER — Telehealth: Payer: Self-pay | Admitting: Family Medicine

## 2019-05-12 ENCOUNTER — Other Ambulatory Visit: Payer: Self-pay | Admitting: Pulmonary Disease

## 2019-05-12 ENCOUNTER — Encounter: Payer: Self-pay | Admitting: *Deleted

## 2019-05-12 ENCOUNTER — Other Ambulatory Visit: Payer: Self-pay | Admitting: *Deleted

## 2019-05-12 DIAGNOSIS — Z23 Encounter for immunization: Secondary | ICD-10-CM | POA: Diagnosis not present

## 2019-05-12 DIAGNOSIS — N186 End stage renal disease: Secondary | ICD-10-CM | POA: Diagnosis not present

## 2019-05-12 DIAGNOSIS — Z992 Dependence on renal dialysis: Secondary | ICD-10-CM | POA: Diagnosis not present

## 2019-05-12 DIAGNOSIS — D689 Coagulation defect, unspecified: Secondary | ICD-10-CM | POA: Diagnosis not present

## 2019-05-12 DIAGNOSIS — N2581 Secondary hyperparathyroidism of renal origin: Secondary | ICD-10-CM | POA: Diagnosis not present

## 2019-05-12 NOTE — Patient Outreach (Signed)
Outreach call to pt for telephone assessment, spoke with pt , HIPAA verified, Pt reports CBG 118 today and up to 200 at times with occasional reading up near 300, pt states "dialysis can be rough, I have cramps and feel weak after dialysis"  Pt states he needs to start weighing again and his scale broke recently, needs new one.  (see care plan- ordered new scale for pt and mailed to his home).  Pt states he needs to make appointment to see endocrinologist but cannot remember her name.  RN CM looked up for pt and gave name Dr. Kelton Pillar at [phone] at [address]  Pt states he is able to call and make appointment.  THN CM Care Plan Problem One     Most Recent Value  Care Plan Problem One  Knowledge deficit related to diabetes  Role Documenting the Problem One  Care Management [place] for Problem One  Active  THN Long Term Goal   Pt will demonstrate/ verbalize improved self care related to diabetes within 60 days  THN Long Term Goal Start Date  05/12/19 Barrie Folk re-established]  Interventions for Problem One Long Term Goal  RN CM reviewed plan of care with pt, pt reports he has all medications and taking as prescribed, continues checking CBG daily, patient's scale recently broke and pt unable to weigh daily and has no money to buy scale,  RN CM sent email to Lavell Islam with [facility] to have scale mailed to pt home.  THN CM Short Term Goal #1   Pt will verbalize plate method and practice portion control within 30 days  THN CM Short Term Goal #1 Start Date  05/12/19 Barrie Folk re-established   pt needs reinforcement]  Interventions for Short Term Goal #1  RN CM reinforced plate method, nutritious food choices, portion control    THN CM Care Plan Problem Two     Most Recent Value  Care Plan Problem Two  End stage renal disease (new to dialysis)  Role Documenting the Problem Two  Care Management Coordinator  Care Plan for Problem Two  Active  THN CM Short Term Goal #1   Pt will  verbalize interventions for positive health changes/ actions to facilitate successful dialysis/ goals within 30 days  THN CM Short Term Goal #1 Start Date  02/28/19  [facility] CM Short Term Goal #1 Met Date   04/11/19      PLAN Outreach pt next month for telephone assessment  Jacqlyn Larsen [facility], [place] Coordinator [phone]

## 2019-05-12 NOTE — Telephone Encounter (Signed)
Forms were picked up via fax and left at nurses station in [place] box. Per [facility] tele encounter, provider has a copy in her red box to be completed upon her return to the office

## 2019-05-12 NOTE — Telephone Encounter (Signed)
Pt is calling to find out why ppr work has not been signed off for in house home care / should have been faxed over recently / pt iis on dialysis and weakness . The paperwork would have been faxed over from [facility] [phone] is agency phone number . Wants to make sure that gets signed and faxed over .  FR

## 2019-05-13 ENCOUNTER — Other Ambulatory Visit (HOSPITAL_COMMUNITY)
Admission: RE | Admit: 2019-05-13 | Discharge: 2019-05-13 | Disposition: A | Discharge status: Home / Self Care | Payer: Medicare Other | Source: Ambulatory Visit | Attending: Pulmonary Disease | Admitting: Pulmonary Disease

## 2019-05-13 DIAGNOSIS — Z01812 Encounter for preprocedural laboratory examination: Secondary | ICD-10-CM | POA: Diagnosis not present

## 2019-05-13 DIAGNOSIS — Z20828 Contact with and (suspected) exposure to other viral communicable diseases: Secondary | ICD-10-CM | POA: Insufficient documentation

## 2019-05-14 LAB — NOVEL CORONAVIRUS, NAA (HOSP ORDER, SEND-OUT TO REF LAB; TAT 18-24 HRS): SARS-CoV-2, NAA: NOT DETECTED

## 2019-05-15 DIAGNOSIS — N2581 Secondary hyperparathyroidism of renal origin: Secondary | ICD-10-CM | POA: Diagnosis not present

## 2019-05-15 DIAGNOSIS — Z992 Dependence on renal dialysis: Secondary | ICD-10-CM | POA: Diagnosis not present

## 2019-05-15 DIAGNOSIS — D689 Coagulation defect, unspecified: Secondary | ICD-10-CM | POA: Diagnosis not present

## 2019-05-15 DIAGNOSIS — Z23 Encounter for immunization: Secondary | ICD-10-CM | POA: Diagnosis not present

## 2019-05-15 DIAGNOSIS — N186 End stage renal disease: Secondary | ICD-10-CM | POA: Diagnosis not present

## 2019-05-16 ENCOUNTER — Ambulatory Visit: Payer: Medicare Other | Admitting: Primary Care

## 2019-05-16 ENCOUNTER — Ambulatory Visit (INDEPENDENT_AMBULATORY_CARE_PROVIDER_SITE_OTHER): Payer: Medicare Other | Admitting: Primary Care

## 2019-05-16 ENCOUNTER — Encounter: Payer: Self-pay | Admitting: Primary Care

## 2019-05-16 ENCOUNTER — Ambulatory Visit (INDEPENDENT_AMBULATORY_CARE_PROVIDER_SITE_OTHER): Payer: Medicare Other | Admitting: Pulmonary Disease

## 2019-05-16 ENCOUNTER — Other Ambulatory Visit: Payer: Self-pay

## 2019-05-16 VITALS — BP 132/68 | HR 73 | Ht 71.5 in | Wt 271.0 lb

## 2019-05-16 DIAGNOSIS — G4733 Obstructive sleep apnea (adult) (pediatric): Secondary | ICD-10-CM

## 2019-05-16 DIAGNOSIS — J449 Chronic obstructive pulmonary disease, unspecified: Secondary | ICD-10-CM

## 2019-05-16 LAB — PULMONARY FUNCTION TEST
DL/VA % pred: 101 %
DL/VA: 4.4 ml/min/mmHg/L
DLCO unc % pred: 81 %
DLCO unc: 24.66 ml/min/mmHg
FEF 25-75 Post: 3.31 L/sec
FEF 25-75 Pre: 3.29 L/sec
FEF2575-%Change-Post: 0 %
FEF2575-%Pred-Post: 95 %
FEF2575-%Pred-Pre: 94 %
FEV1-%Change-Post: 0 %
FEV1-%Pred-Post: 81 %
FEV1-%Pred-Pre: 80 %
FEV1-Post: 3.28 L
FEV1-Pre: 3.26 L
FEV1FVC-%Change-Post: 1 %
FEV1FVC-%Pred-Pre: 103 %
FEV6-%Change-Post: 0 %
FEV6-%Pred-Post: 80 %
FEV6-%Pred-Pre: 79 %
FEV6-Post: 4.03 L
FEV6-Pre: 4.02 L
FEV6FVC-%Change-Post: 0 %
FEV6FVC-%Pred-Post: 103 %
FEV6FVC-%Pred-Pre: 103 %
FVC-%Change-Post: 0 %
FVC-%Pred-Post: 77 %
FVC-%Pred-Pre: 77 %
FVC-Post: 4.07 L
FVC-Pre: 4.08 L
Post FEV1/FVC ratio: 81 %
Post FEV6/FVC ratio: 99 %
Pre FEV1/FVC ratio: 80 %
Pre FEV6/FVC Ratio: 99 %
RV % pred: 94 %
RV: 2.06 L
TLC % pred: 78 %
TLC: 5.72 L

## 2019-05-16 NOTE — Patient Instructions (Signed)
Patient walked out of appointment 2 minutess in

## 2019-05-16 NOTE — Assessment & Plan Note (Addendum)
-   Unable to review PFTs or assess patient d/t patient walking out of appointment

## 2019-05-16 NOTE — Progress Notes (Signed)
Full PFT performed today. °

## 2019-05-16 NOTE — Progress Notes (Addendum)
@Patient  ID: Jerry Cantrell, male    DOB: 11/15/1965, 53 y.o.   MRN: [id number]  Chief Complaint  Patient presents with  . Follow-up    review pft.  pt states he is tolerating new cpap pressures well.      Referring provider: Forrest Moron, MD  HPI: 53 year old male, current every day smoker. 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 Patient of Dr. Elsworth Soho, last seen by pulmonary NP on 03/09/19 for hospital follow-up.  Admitted from 6/22-6/26 for acute on chronic respiratory failure secondary to volume overload and progression of ESRD. Hemodialysis MWF.  BiPAP - Med Simplus FFM; 5-25 ps 5 bipap auto. He had bipap titration study in July 2020 (optimal setting 28/23 cm H20).  05/16/2019 Patient presents today for follow-up visit with PFTs. States that we told him he could not have a "transplant" due to his respiratory status and sleep apnea. I started to go over his [employer] which showed he was still having obstructive events at night AHI 14.3 and recommended we go up on Min Epap pressure to 18cm h20. Patient got angry saying "so you're telling me I can't have transplant surgery d/t my sleep apnea. I need to be on CPAP and not BIPAP." Patient stated that I was not listening to him and preceded to walk out after 2 minutes in the room together.   Airview download 8/14-9/12/20: 29/20 days used; 93% >4 hours Average usage 8 hours 39 mins Pressure 25/15; PS 5 AHI 14.3  PFTs: 05/16/2019 - FVC 4.07 (77%), FEV1 3.28 (81%), ratio 81. No BD response. DLCOunc 81%  No Known Allergies  Immunization History  Administered Date(s) Administered  . Influenza Inj Mdck Quad Pf 06/18/2018  . Influenza Split 05/12/2012  . Influenza, Seasonal, Injecte, Preservative Fre 05/11/2013  . Influenza,inj,Quad PF,6+ Mos 05/11/2013, 05/15/2015, 06/16/2016, 05/10/2017  .  Pneumococcal Conjugate-13 06/16/2016  . Pneumococcal Polysaccharide-23 06/16/2013, 01/19/2015  . Td 05/12/2012    Past Medical History:  Diagnosis Date  . Acute diastolic heart failure ([place])   . CHF (congestive heart failure) ([place])   . Diabetes mellitus with nephropathy ([place]) 05/12/2012   Dr. Justin Mend follows  . Hemorrhoids 05/12/2012  . Hyperlipidemia   . Hypertension   . Lumbar spinal stenosis 05/03/2012   Mild with only right L4 nerve root encroachment, no neurogenic claudication   . Lumbar spondylosis 05/03/2012  . Meralgia paraesthetica 05/03/2012  . Meralgia paraesthetica 05/03/2012   On Lyrica which does improve pain.   Marland Kitchen Neuropathy in diabetes ([place]) 05/12/2012  . Obesity   . Pneumonia   . Retinopathy   . Sleep apnea    uses cpap  . Substance abuse ([place])   . Urinary incontinence 05/12/2012   Since the start of Sept. 2013     Tobacco History: Social History   Tobacco Use  Smoking Status Current Every Day Smoker  . Packs/day: 1.00  . Years: 30.00  . Pack years: 30.00  . Types: Cigarettes  Smokeless Tobacco Never Used  Tobacco Comment   down to .5ppd 05/16/2019   Ready to quit: Not Answered Counseling given: Not Answered Comment: down to .5ppd 05/16/2019   Outpatient Medications Prior to Visit  Medication Sig Dispense Refill  . ACCU-CHEK AVIVA PLUS test strip USE ONE STRIP 5 (FIVE) TIMES DAILY AS INSTRUCTED 400 each 0  . Accu-Chek Softclix Lancets lancets Use as instructed  450 each 0  . atorvastatin (LIPITOR) 80 MG tablet Take 1 tablet (80 mg total) by mouth daily at 6 PM. 90 tablet 0  . bacitracin-polymyxin b (POLYSPORIN) ointment Apply topically 2 (two) times daily. 15 g 0  . carvedilol (COREG) 25 MG tablet Take 25 mg by mouth 2 (two) times daily with a meal.    . cetirizine (ZYRTEC) 10 MG tablet TAKE ONE TABLET BY MOUTH ONCE DAILY FOR ALLERGIES 90 tablet 0  . cinacalcet (SENSIPAR) 60 MG tablet Take 60 mg by mouth daily.    . clobetasol cream (TEMOVATE) 0.05 % APPLY TO  AFFECED ARES(S) TOPICALLY TWICE DAILY 45 g 0  . Continuous Blood Gluc Receiver (FREESTYLE LIBRE 14 DAY READER) DEVI 1 application by Does not apply route 3 (three) times daily. Use to check blood glucoses 2 Device 11  . Continuous Blood Gluc Receiver (FREESTYLE LIBRE 2 READER SYSTM) DEVI 1 Device by Does not apply route as directed. 1 Device 0  . dextrose (GLUTOSE) 40 % GEL Take 37.5 g by mouth once as needed for up to 1 dose for low blood sugar. 37.5 g 1  . fluconazole (DIFLUCAN) 100 MG tablet Take 200 mg by mouth once a week.     . fluticasone (FLONASE) 50 MCG/ACT nasal spray USE TWO SPRAYS INTO EACH NOSTRILS DAILY 16 g 6  . folic acid-vitamin b complex-vitamin c-selenium-zinc (DIALYVITE) 3 MG TABS tablet Take 1 tablet by mouth daily.    . furosemide (LASIX) 80 MG tablet Take 1 tablet (80 mg total) by mouth every Tuesday, Thursday, Saturday, and Sunday. 120 tablet 0  . GAVILAX powder TAKE ONE CAPFUL (17 GRAMS) BY MOUTH 2 (TWO) TIMES DAILY AS NEEDED. (Patient taking differently: Take 17 g by mouth 2 (two) times daily as needed for mild constipation. ) 510 g 1  . hydrALAZINE (APRESOLINE) 100 MG tablet TAKE 1 TABLET (100 MG TOTAL) BY MOUTH 3 (THREE) TIMES DAILY. 270 tablet 1  . Insulin Glargine (LANTUS SOLOSTAR) 100 UNIT/ML Solostar Pen Inject 45 Units into the skin daily. INJECT 30 UNITS UNDER THE SKIN EVERY MORNING AND INJECT 15 UNITS EVERY NIGHT (Patient taking differently: Inject 45 Units into the skin daily. ) 15 mL 6  . insulin lispro (HUMALOG KWIKPEN) 100 UNIT/ML KwikPen Inject 0.08 mLs (8 Units total) into the skin 3 (three) times daily. Max daily dose of 40 units daily (Patient taking differently: Inject 10-20 Units into the skin 3 (three) times daily. Max daily dose of 40 units daily) 15 mL 6  . Insulin Pen Needle (B-D UF III MINI PEN NEEDLES) 31G X 5 MM MISC Four times daily 360 each 0  . lidocaine-prilocaine (EMLA) cream APPLY SMALL AMOUNT TO ACCESS SITE (AVF) 1 TO 2 HOURS BEFORE DIALYSIS.  COVER WITH OCCLUSIVE DRESSING (SARAN WRAP)    . NARCAN 4 MG/0.1ML LIQD nasal spray kit Place 2 mLs into the nose See admin instructions. May repeat dose every 2 to 3 minutes until responsive or ems arrives.    . Nicotine 21-14-7 MG/24HR KIT Place 1 patch onto the skin daily. 56 each 0  . Olopatadine HCl 0.2 % SOLN APPLY 1 DROP TO EYE DAILY. 2.5 mL 2  . Oxycodone HCl 10 MG TABS Take 10 mg by mouth every 8 (eight) hours.    . pregabalin (LYRICA) 100 MG capsule TAKE 1 CAPSULE BY MOUTH IN THE MORNING AND 1 CAPSULE IN THE EVENING    . silver sulfADIAZINE (SILVADENE) 1 % cream Apply 1 application topically daily.  50 g 0  . verapamil (CALAN-SR) 240 MG CR tablet Take 1 tablet (240 mg total) by mouth at bedtime. 90 tablet 3   No facility-administered medications prior to visit.       Review of Systems  Review of Systems  Reason unable to perform ROS: patient walked out of appointment      Physical Exam  BP 132/68 (BP Location: Right Arm, Cuff Size: Normal)   Pulse 73   Ht 5' 11.5" (1.816 m)   Wt 271 lb (122.9 kg)   SpO2 97%   BMI 37.27 kg/m  Physical Exam  Unable to do physical exam  Lab Results:  CBC    Component Value Date/Time   WBC 4.3 02/23/2019 1230   RBC 3.12 (L) 02/23/2019 1230   HGB 11.2 (A) 03/18/2019   HCT 29.8 (L) 02/23/2019 1230   PLT 110 (L) 02/23/2019 1230   MCV 95.5 02/23/2019 1230   MCH 29.2 02/23/2019 1230   MCHC 30.5 02/23/2019 1230   RDW 12.7 02/23/2019 1230   LYMPHSABS 1.1 02/20/2019 1453   MONOABS 0.6 02/20/2019 1453   EOSABS 0.1 02/20/2019 1453   BASOSABS 0.0 02/20/2019 1453    BMET    Component Value Date/Time   NA 138 02/23/2019 1230   NA 140 11/11/2017 1420   K 3.9 02/23/2019 1230   CL 100 02/23/2019 1230   CO2 25 02/23/2019 1230   GLUCOSE 243 (H) 02/23/2019 1230   BUN 65 (H) 02/23/2019 1230   BUN 78 (HH) 11/11/2017 1420   CREATININE 8.5 (A) 02/28/2019   CREATININE 8.30 (H) 02/23/2019 1230   CREATININE 3.60 (H) 09/21/2016 1138    CALCIUM 8.7 (L) 02/23/2019 1230   GFRNONAA 7 (L) 02/23/2019 1230   GFRNONAA 18 (L) 09/21/2016 1138   GFRAA 8 (L) 02/23/2019 1230   GFRAA 21 (L) 09/21/2016 1138    BNP    Component Value Date/Time   BNP 457.1 (H) 02/20/2019 1454   BNP 29.5 04/27/2016 1300    ProBNP    Component Value Date/Time   PROBNP 225.0 (H) 07/16/2015 1158    Imaging: US Renal  Result Date: 04/20/2019 CLINICAL DATA:  54 year old male with end-stage renal disease. Difficulty urinating. EXAM: RENAL / URINARY TRACT ULTRASOUND COMPLETE COMPARISON:  None. FINDINGS: Right Kidney: Renal measurements: 10.9 x 5.8 x 5.7 cm = volume: 188 mL. Diffusely increased renal echogenicity noted. There is no evidence of solid mass or hydronephrosis. Left Kidney: Renal measurements: 11.9 x 5.6 x 6.1 cm = volume: 212 mL. Diffusely increased renal echogenicity noted. There is no evidence of solid mass or hydronephrosis. Bladder: Not well distended but appears normal for degree of bladder distention. IMPRESSION: 1. Bladder not well distended but appears unremarkable. 2. Increased bilateral renal echogenicity compatible with medical renal disease. No evidence of hydronephrosis. Electronically Signed   By: Margarette Canada M.D.   On: 04/20/2019 17:10     Assessment & Plan:   Obstructive sleep apnea - Compliant with BIPAP use - Pressure 25/15; PS 5 - AHI 14.3 - Recommending pressure change to 25/18 d/t obstructive events  - Patient walked out of appointment after <26mns in room - No changes or orders placed   [place] - Unable to review PFTs or assess patient d/t patient walking out of appointment    EMartyn Ehrich NP 05/16/2019

## 2019-05-16 NOTE — Assessment & Plan Note (Signed)
-   Compliant with BIPAP use - Pressure 25/15; PS 5 - AHI 14.3 - Recommending pressure change to 25/18 d/t obstructive events  - Patient walked out of appointment after <58mins in room - No changes or orders placed

## 2019-05-16 NOTE — Telephone Encounter (Signed)
Khadjah from [employer] called and wanting to know if the forms for home healthcare has been completed. She would like an update on the forms. Her phone number is [phone].

## 2019-05-17 ENCOUNTER — Telehealth: Payer: Self-pay | Admitting: Pulmonary Disease

## 2019-05-17 NOTE — Telephone Encounter (Signed)
Called the patient back and confirmed the fax needs to be sent to [facility], contact # [phone]. Fax # [phone]  Faxed results to the dialysis center facility twice. Waiting on fax confirmation.  Kept in Triage as FYI for follow up tomorrow morning.

## 2019-05-17 NOTE — Telephone Encounter (Signed)
Spoke with Jerry Cantrell an advised dr Nolon Rod has form and will complete and sign and will get this back to her today.  Agreeable.

## 2019-05-18 DIAGNOSIS — Z992 Dependence on renal dialysis: Secondary | ICD-10-CM | POA: Diagnosis not present

## 2019-05-18 DIAGNOSIS — N2581 Secondary hyperparathyroidism of renal origin: Secondary | ICD-10-CM | POA: Diagnosis not present

## 2019-05-18 DIAGNOSIS — N186 End stage renal disease: Secondary | ICD-10-CM | POA: Diagnosis not present

## 2019-05-18 NOTE — Telephone Encounter (Signed)
Negative covid test result was faxed twice to patient's dialysis center on 05/18/19. Nothing further needed at this time.

## 2019-05-19 DIAGNOSIS — Z992 Dependence on renal dialysis: Secondary | ICD-10-CM | POA: Diagnosis not present

## 2019-05-19 DIAGNOSIS — N2581 Secondary hyperparathyroidism of renal origin: Secondary | ICD-10-CM | POA: Diagnosis not present

## 2019-05-19 DIAGNOSIS — N186 End stage renal disease: Secondary | ICD-10-CM | POA: Diagnosis not present

## 2019-05-22 DIAGNOSIS — Z992 Dependence on renal dialysis: Secondary | ICD-10-CM | POA: Diagnosis not present

## 2019-05-22 DIAGNOSIS — N186 End stage renal disease: Secondary | ICD-10-CM | POA: Diagnosis not present

## 2019-05-22 DIAGNOSIS — N2581 Secondary hyperparathyroidism of renal origin: Secondary | ICD-10-CM | POA: Diagnosis not present

## 2019-05-23 DIAGNOSIS — N186 End stage renal disease: Secondary | ICD-10-CM | POA: Diagnosis not present

## 2019-05-23 DIAGNOSIS — Z992 Dependence on renal dialysis: Secondary | ICD-10-CM | POA: Diagnosis not present

## 2019-05-23 DIAGNOSIS — N2581 Secondary hyperparathyroidism of renal origin: Secondary | ICD-10-CM | POA: Diagnosis not present

## 2019-05-24 DIAGNOSIS — N186 End stage renal disease: Secondary | ICD-10-CM | POA: Diagnosis not present

## 2019-05-24 DIAGNOSIS — Z992 Dependence on renal dialysis: Secondary | ICD-10-CM | POA: Diagnosis not present

## 2019-05-24 DIAGNOSIS — N2581 Secondary hyperparathyroidism of renal origin: Secondary | ICD-10-CM | POA: Diagnosis not present

## 2019-05-26 DIAGNOSIS — D689 Coagulation defect, unspecified: Secondary | ICD-10-CM | POA: Diagnosis not present

## 2019-05-26 DIAGNOSIS — N186 End stage renal disease: Secondary | ICD-10-CM | POA: Diagnosis not present

## 2019-05-26 DIAGNOSIS — N2581 Secondary hyperparathyroidism of renal origin: Secondary | ICD-10-CM | POA: Diagnosis not present

## 2019-05-26 DIAGNOSIS — Z23 Encounter for immunization: Secondary | ICD-10-CM | POA: Diagnosis not present

## 2019-05-26 DIAGNOSIS — Z992 Dependence on renal dialysis: Secondary | ICD-10-CM | POA: Diagnosis not present

## 2019-05-29 DIAGNOSIS — N2581 Secondary hyperparathyroidism of renal origin: Secondary | ICD-10-CM | POA: Diagnosis not present

## 2019-05-29 DIAGNOSIS — Z992 Dependence on renal dialysis: Secondary | ICD-10-CM | POA: Diagnosis not present

## 2019-05-29 DIAGNOSIS — Z23 Encounter for immunization: Secondary | ICD-10-CM | POA: Diagnosis not present

## 2019-05-29 DIAGNOSIS — D689 Coagulation defect, unspecified: Secondary | ICD-10-CM | POA: Diagnosis not present

## 2019-05-29 DIAGNOSIS — N186 End stage renal disease: Secondary | ICD-10-CM | POA: Diagnosis not present

## 2019-05-30 DIAGNOSIS — M5431 Sciatica, right side: Secondary | ICD-10-CM | POA: Diagnosis not present

## 2019-05-30 DIAGNOSIS — G890 Central pain syndrome: Secondary | ICD-10-CM | POA: Diagnosis not present

## 2019-05-30 DIAGNOSIS — M5432 Sciatica, left side: Secondary | ICD-10-CM | POA: Diagnosis not present

## 2019-05-30 DIAGNOSIS — M545 Low back pain: Secondary | ICD-10-CM | POA: Diagnosis not present

## 2019-05-31 DIAGNOSIS — Z23 Encounter for immunization: Secondary | ICD-10-CM | POA: Diagnosis not present

## 2019-05-31 DIAGNOSIS — N2581 Secondary hyperparathyroidism of renal origin: Secondary | ICD-10-CM | POA: Diagnosis not present

## 2019-05-31 DIAGNOSIS — Z992 Dependence on renal dialysis: Secondary | ICD-10-CM | POA: Diagnosis not present

## 2019-05-31 DIAGNOSIS — N186 End stage renal disease: Secondary | ICD-10-CM | POA: Diagnosis not present

## 2019-05-31 DIAGNOSIS — E1122 Type 2 diabetes mellitus with diabetic chronic kidney disease: Secondary | ICD-10-CM | POA: Diagnosis not present

## 2019-05-31 DIAGNOSIS — D689 Coagulation defect, unspecified: Secondary | ICD-10-CM | POA: Diagnosis not present

## 2019-06-01 ENCOUNTER — Other Ambulatory Visit: Payer: Self-pay | Admitting: Family Medicine

## 2019-06-01 DIAGNOSIS — R05 Cough: Secondary | ICD-10-CM

## 2019-06-01 DIAGNOSIS — R059 Cough, unspecified: Secondary | ICD-10-CM

## 2019-06-02 DIAGNOSIS — N2581 Secondary hyperparathyroidism of renal origin: Secondary | ICD-10-CM | POA: Diagnosis not present

## 2019-06-02 DIAGNOSIS — N186 End stage renal disease: Secondary | ICD-10-CM | POA: Diagnosis not present

## 2019-06-02 DIAGNOSIS — M545 Low back pain: Secondary | ICD-10-CM | POA: Diagnosis not present

## 2019-06-02 DIAGNOSIS — D689 Coagulation defect, unspecified: Secondary | ICD-10-CM | POA: Diagnosis not present

## 2019-06-02 DIAGNOSIS — E8779 Other fluid overload: Secondary | ICD-10-CM | POA: Diagnosis not present

## 2019-06-02 DIAGNOSIS — E1122 Type 2 diabetes mellitus with diabetic chronic kidney disease: Secondary | ICD-10-CM | POA: Diagnosis not present

## 2019-06-02 DIAGNOSIS — E875 Hyperkalemia: Secondary | ICD-10-CM | POA: Diagnosis not present

## 2019-06-02 DIAGNOSIS — Z992 Dependence on renal dialysis: Secondary | ICD-10-CM | POA: Diagnosis not present

## 2019-06-02 DIAGNOSIS — Z79899 Other long term (current) drug therapy: Secondary | ICD-10-CM | POA: Diagnosis not present

## 2019-06-05 ENCOUNTER — Other Ambulatory Visit: Payer: Self-pay

## 2019-06-05 ENCOUNTER — Encounter: Payer: Self-pay | Admitting: Acute Care

## 2019-06-05 ENCOUNTER — Ambulatory Visit (INDEPENDENT_AMBULATORY_CARE_PROVIDER_SITE_OTHER): Payer: Medicare Other | Admitting: Acute Care

## 2019-06-05 ENCOUNTER — Ambulatory Visit: Payer: Medicare Other | Admitting: Acute Care

## 2019-06-05 VITALS — BP 122/62 | HR 72 | Ht 71.5 in | Wt 286.6 lb

## 2019-06-05 DIAGNOSIS — Z720 Tobacco use: Secondary | ICD-10-CM

## 2019-06-05 DIAGNOSIS — N186 End stage renal disease: Secondary | ICD-10-CM

## 2019-06-05 DIAGNOSIS — M545 Low back pain: Secondary | ICD-10-CM | POA: Diagnosis not present

## 2019-06-05 DIAGNOSIS — F17210 Nicotine dependence, cigarettes, uncomplicated: Secondary | ICD-10-CM | POA: Diagnosis not present

## 2019-06-05 DIAGNOSIS — G4733 Obstructive sleep apnea (adult) (pediatric): Secondary | ICD-10-CM | POA: Diagnosis not present

## 2019-06-05 DIAGNOSIS — E1122 Type 2 diabetes mellitus with diabetic chronic kidney disease: Secondary | ICD-10-CM | POA: Diagnosis not present

## 2019-06-05 DIAGNOSIS — Z992 Dependence on renal dialysis: Secondary | ICD-10-CM | POA: Diagnosis not present

## 2019-06-05 DIAGNOSIS — D689 Coagulation defect, unspecified: Secondary | ICD-10-CM | POA: Diagnosis not present

## 2019-06-05 DIAGNOSIS — E875 Hyperkalemia: Secondary | ICD-10-CM | POA: Diagnosis not present

## 2019-06-05 DIAGNOSIS — E8779 Other fluid overload: Secondary | ICD-10-CM | POA: Diagnosis not present

## 2019-06-05 DIAGNOSIS — N2581 Secondary hyperparathyroidism of renal origin: Secondary | ICD-10-CM | POA: Diagnosis not present

## 2019-06-05 DIAGNOSIS — Z79899 Other long term (current) drug therapy: Secondary | ICD-10-CM | POA: Diagnosis not present

## 2019-06-05 DIAGNOSIS — Z6841 Body Mass Index (BMI) 40.0 and over, adult: Secondary | ICD-10-CM

## 2019-06-05 NOTE — Assessment & Plan Note (Signed)
Dry weight is 119 KG Weight today 130 KG For HD today Usually pull 10-15 pounds of fluid per HD session per patient Plan Continue HD Monday Wednesday Friday without fail. Continue lasix on non-HD days. Monitor weight and edema

## 2019-06-05 NOTE — Assessment & Plan Note (Addendum)
Discussed need to work on losing weight for both his OSA and his pulmonary status Plan Encouraged to work on diet and exerciseConsider referral to medical

## 2019-06-05 NOTE — Progress Notes (Deleted)
History of Present Illness Jerry Cantrell is a 53 y.o. male  current every day smoker with polysubstance abuse, severe OSA on nocturnal BiPAP, chronic hypoxic respiratory failure and ESRD requiring HD. He is followed by Dr. Elsworth Soho  HPI: 53 year old male, current every day smoker. 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 Patient of Dr. Elsworth Soho, last seen by pulmonary NP on 03/09/19 for hospital follow-up.  Admitted from 6/22-6/26 for acute on chronic respiratory failure secondary to volume overload and progression of ESRD. Hemodialysis MWF.  BiPAP - Med Simplus FFM; 5-25 ps 5 bipap auto. He had bipap titration study in July 2020 (optimal setting 28/23 cm H20).   06/05/2019 Follow up for PFT review Pt. Presents for follow up.He was last in the office 05/16/2019 for review of his PFT's. He was under the impression that our office had informed him that due to his PFT's he was not a candidate for renal transplant. There is no documentation of this. He became angry and left the office without completing his visit. He is here today to follow up. Down Load shows need to up titrate pressures to what was recommended at his last CPAP titration ( 28/min 15 cm H2O )    Test Results: Airview Down Load 05/03/2019-06/01/2019 AirCurve 10 VAuto Max IPAP 25 cm H2O Min EPAP 15 cm H2O PS of 5 29/30 days used, 97% > 4 hours 28 days 93% Average usage 8 hours 40 minutes AHI 13.2  Airview download 8/14-9/12/20: 29/20 days used; 93% >4 hours Average usage 8 hours 39 mins Pressure 25/15; PS 5 AHI 14.3  PFT's 05/16/2019 FVC (L) 4.08 5.23 77 4.07 77 +0 FEV1 (L) 3.26 4.03 80 3.28 81 +0 FEV1/FVC (%) 80 77 103 81 104 +1 FEV6 (L) 4.02 5.04 79 4.03 80 +0 FEV1/FEV6 (%) 81 80 101 81 101 +0 FEF 25-75% (L/sec) 3.29 3.47 94 3.31 95 +0 FEF Max (L/sec) 8.18 10.01 81 7.78 77 -4 FIVC (L) 3.34  3.26 -2 FIF Max (L/sec) 3.58 3.43 -4 ---- LUNG VOLUMES ---- SVC (L) 3.67 5.23 70 IC (L) 2.81 3.52 80 RV (Pleth) (L) 2.06 2.18 94 TLC (Pleth) (L) 5.72 7.27 78 RV/TLC ([place]) (%) 36 30 118 ERV (L) 0.85 1.72 49 ---- DIFFUSION ---- DLCOunc (ml/min/mmHg) 24.66 30.20 81 DLCOcor (ml/min/mmHg) 30.20 DL/VA (ml/min/mmHg/L) 4.40 4.34 101 VA (L) 5.60 6.95 80 ---- AIRWAYS RESISTANCE -- Raw (cmH2O/L/s) 2.59 1.45 179 Gaw (L/s/cmH2O) 0.39 1.03 37 sRaw (cmH2O*s) 7.31 < 4.76 sGaw (1/cmH2O*s) 0.14 0.20 68 No evidence of obstruction Moderately severe Restriction -Interstitial Mild Diffusion Defect  CBC Latest Ref Rng & Units 03/18/2019 03/14/2019 02/23/2019  WBC 4.0 - 10.5 K/uL - - 4.3  Hemoglobin 13.5 - 17.5 11.2(A) 10.6(A) 9.1(L)  Hematocrit 39.0 - 52.0 % - - 29.8(L)  Platelets 150 - 400 K/uL - - 110(L)    BMP Latest Ref Rng & Units 02/28/2019 02/23/2019 02/22/2019  Glucose 70 - 99 mg/dL - 243(H) 138(H)  BUN 6 - 20 mg/dL - 65(H) 79(H)  Creatinine 0.6 - 1.3 8.5(A) 8.30(H) 9.77(H)  BUN/Creat Ratio 9 - 20 - - -  Sodium 135 - 145 mmol/L - 138 141  Potassium 3.5 - 5.1 mmol/L - 3.9 4.4  Chloride 98 - 111 mmol/L - 100 103  CO2 22 - 32 mmol/L - 25 25  Calcium 8.9 - 10.3 mg/dL - 8.7(L) 8.8(L)  BNP    Component Value Date/Time   BNP 457.1 (H) 02/20/2019 1454   BNP 29.5 04/27/2016 1300    ProBNP    Component Value Date/Time   PROBNP 225.0 (H) 07/16/2015 1158    PFT    Component Value Date/Time   FEV1PRE 3.26 05/16/2019 1046   FEV1POST 3.28 05/16/2019 1046   FVCPRE 4.08 05/16/2019 1046   FVCPOST 4.07 05/16/2019 1046   TLC 5.72 05/16/2019 1046   DLCOUNC 24.66 05/16/2019 1046   PREFEV1FVCRT 80 05/16/2019 1046   PSTFEV1FVCRT 81 05/16/2019 1046    No results found.   Past medical hx Past Medical History:  Diagnosis Date  . Acute diastolic heart failure ([place])   . CHF (congestive heart failure) ([place])   . Diabetes mellitus with nephropathy ([place]) 05/12/2012   Dr. Justin Mend follows  .  Hemorrhoids 05/12/2012  . Hyperlipidemia   . Hypertension   . Lumbar spinal stenosis 05/03/2012   Mild with only right L4 nerve root encroachment, no neurogenic claudication   . Lumbar spondylosis 05/03/2012  . Meralgia paraesthetica 05/03/2012  . Meralgia paraesthetica 05/03/2012   On Lyrica which does improve pain.   Marland Kitchen Neuropathy in diabetes ([place]) 05/12/2012  . Obesity   . Pneumonia   . Retinopathy   . Sleep apnea    uses cpap  . Substance abuse ([place])   . Urinary incontinence 05/12/2012   Since the start of Sept. 2013      Social History   Tobacco Use  . Smoking status: Current Every Day Smoker    Packs/day: 1.00    Years: 30.00    Pack years: 30.00    Types: Cigarettes  . Smokeless tobacco: Never Used  . Tobacco comment: down to .5ppd 05/16/2019  Substance Use Topics  . Alcohol use: No    Comment: " about 40 ounce beer per month."  down to a beer every 3 weeks  . Drug use: No    Types: Marijuana, Cocaine    Comment: marijuana "laced with something"; today, stated "no" to question of illegal drug use 06/16/2016    Jerry Cantrell reports that he has been smoking cigarettes. He has a 30.00 pack-year smoking history. He has never used smokeless tobacco. He reports that he does not drink alcohol or use drugs.  Tobacco Cessation: Current every day smoker  Past surgical hx, Family hx, Social hx all reviewed.  Current Outpatient Medications on File Prior to Visit  Medication Sig  . ACCU-CHEK AVIVA PLUS test strip USE ONE STRIP 5 (FIVE) TIMES DAILY AS INSTRUCTED  . Accu-Chek Softclix Lancets lancets Use as instructed  . atorvastatin (LIPITOR) 80 MG tablet Take 1 tablet (80 mg total) by mouth daily at 6 PM.  . bacitracin-polymyxin b (POLYSPORIN) ointment Apply topically 2 (two) times daily.  . carvedilol (COREG) 25 MG tablet Take 25 mg by mouth 2 (two) times daily with a meal.  . cetirizine (ZYRTEC) 10 MG tablet TAKE ONE TABLET BY MOUTH ONCE DAILY FOR ALLERGIES  . cinacalcet (SENSIPAR)  60 MG tablet Take 60 mg by mouth daily.  . clobetasol cream (TEMOVATE) 0.05 % APPLY TO AFFECED ARES(S) TOPICALLY TWICE DAILY  . Continuous Blood Gluc Receiver (FREESTYLE LIBRE 14 DAY READER) DEVI 1 application by Does not apply route 3 (three) times daily. Use to check blood glucoses  . Continuous Blood Gluc Receiver (FREESTYLE LIBRE 2 READER SYSTM) DEVI 1 Device by Does not apply route as directed.  Marland Kitchen dextrose (GLUTOSE) 40 % GEL Take 37.5 g by mouth  once as needed for up to 1 dose for low blood sugar.  . fluconazole (DIFLUCAN) 100 MG tablet Take 200 mg by mouth once a week.   . fluticasone (FLONASE) 50 MCG/ACT nasal spray USE TWO SPRAYS INTO EACH NOSTRILS DAILY  . folic acid-vitamin b complex-vitamin c-selenium-zinc (DIALYVITE) 3 MG TABS tablet Take 1 tablet by mouth daily.  . furosemide (LASIX) 80 MG tablet Take 1 tablet (80 mg total) by mouth every Tuesday, Thursday, Saturday, and Sunday.  Marland Kitchen GAVILAX powder TAKE ONE CAPFUL (17 GRAMS) BY MOUTH 2 (TWO) TIMES DAILY AS NEEDED. (Patient taking differently: Take 17 g by mouth 2 (two) times daily as needed for mild constipation. )  . hydrALAZINE (APRESOLINE) 100 MG tablet TAKE 1 TABLET (100 MG TOTAL) BY MOUTH 3 (THREE) TIMES DAILY.  Marland Kitchen Insulin Glargine (LANTUS SOLOSTAR) 100 UNIT/ML Solostar Pen Inject 45 Units into the skin daily. INJECT 30 UNITS UNDER THE SKIN EVERY MORNING AND INJECT 15 UNITS EVERY NIGHT (Patient taking differently: Inject 45 Units into the skin daily. )  . insulin lispro (HUMALOG KWIKPEN) 100 UNIT/ML KwikPen Inject 0.08 mLs (8 Units total) into the skin 3 (three) times daily. Max daily dose of 40 units daily (Patient taking differently: Inject 10-20 Units into the skin 3 (three) times daily. Max daily dose of 40 units daily)  . Insulin Pen Needle (B-D UF III MINI PEN NEEDLES) 31G X 5 MM MISC Four times daily  . lidocaine-prilocaine (EMLA) cream APPLY SMALL AMOUNT TO ACCESS SITE (AVF) 1 TO 2 HOURS BEFORE DIALYSIS. COVER WITH OCCLUSIVE  DRESSING (SARAN WRAP)  . NARCAN 4 MG/0.1ML LIQD nasal spray kit Place 2 mLs into the nose See admin instructions. May repeat dose every 2 to 3 minutes until responsive or ems arrives.  . Nicotine 21-14-7 MG/24HR KIT Place 1 patch onto the skin daily.  . Olopatadine HCl 0.2 % SOLN APPLY 1 DROP TO EYE DAILY.  Marland Kitchen Oxycodone HCl 10 MG TABS Take 10 mg by mouth every 8 (eight) hours.  . pregabalin (LYRICA) 100 MG capsule TAKE 1 CAPSULE BY MOUTH IN THE MORNING AND 1 CAPSULE IN THE EVENING  . silver sulfADIAZINE (SILVADENE) 1 % cream Apply 1 application topically daily.  . verapamil (CALAN-SR) 240 MG CR tablet Take 1 tablet (240 mg total) by mouth at bedtime.   No current facility-administered medications on file prior to visit.      No Known Allergies  Review Of Systems:  Constitutional:   No  weight loss, night sweats,  Fevers, chills, fatigue, or  lassitude.  HEENT:   No headaches,  Difficulty swallowing,  Tooth/dental problems, or  Sore throat,                No sneezing, itching, ear ache, nasal congestion, post nasal drip,   CV:  No chest pain,  Orthopnea, PND, swelling in lower extremities, anasarca, dizziness, palpitations, syncope.   GI  No heartburn, indigestion, abdominal pain, nausea, vomiting, diarrhea, change in bowel habits, loss of appetite, bloody stools.   Resp: No shortness of breath with exertion or at rest.  No excess mucus, no productive cough,  No non-productive cough,  No coughing up of blood.  No change in color of mucus.  No wheezing.  No chest wall deformity  Skin: no rash or lesions.  GU: no dysuria, change in color of urine, no urgency or frequency.  No flank pain, no hematuria   MS:  No joint pain or swelling.  No decreased range of motion.  No back pain.  Psych:  No change in mood or affect. No depression or anxiety.  No memory loss.   Vital Signs There were no vitals taken for this visit.   Physical Exam:  General- No distress,  A&Ox3 ENT: No sinus  tenderness, TM clear, pale nasal mucosa, no oral exudate,no post nasal drip, no LAN Cardiac: S1, S2, regular rate and rhythm, no murmur Chest: No wheeze/ rales/ dullness; no accessory muscle use, no nasal flaring, no sternal retractions Abd.: Soft Non-tender Ext: No clubbing cyanosis, edema Neuro:  normal strength Skin: No rashes, warm and dry Psych: normal mood and behavior   Assessment/Plan  No problem-specific Assessment & Plan notes found for this encounter.    Magdalen Spatz, NP 06/05/2019  9:00 AM

## 2019-06-05 NOTE — Progress Notes (Signed)
History of Present Illness Jerry Cantrell is a 53 y.o. male  current every day smoker with polysubstance abuse, severe OSA on nocturnal BiPAP, chronic hypoxic respiratory failure and ESRD requiring HD. He is followed by Dr. Elsworth Soho  Synopsis 53 year old male active smoker with polysubstance abuse (cocaine) followed for severe obstructive sleep apnea on nocturnal BiPAP, chronic hypoxic respiratory failure previously on O2 (d/c 2019 ) , stable pulmonary nodules ( serial CT chest x 3  Years 2016-2019 ), Chronic Bronchitis -active smoker   (Mostly Restrictive dz on PFT, CT )  Medical history significant for chronic diastolic heart failure and chronic kidney disease stage IV-has an AV fistula in the left arm Hemodialysis MWF.  BiPAP - [facility]; 5-25 ps 5 bipap auto. He had bipap titration study in July 2020 (optimal setting 28/23 cm H20).   06/05/2019  Pt. Presents for follow up. He had been seen in the office 05/16/2019.  Pt. Did not complete the visit, and left abruptly. Please see note.  Today patient presents with a very calm demeanor. We discussed his recent PFT's and his current BiPAP down Load. He continues to have significant apnea with a AHI of 13.2 . He is compliant with his BiPAP almost every night. We discussed that we need to have his settings adjusted to those recommended by his recent BiPAP titration, with the hope that the changes will resolve the high number of events.  Jerry Cantrell is up for re-evaluation for renal transplantation 10/2019. We spend about 20 minutes discussing that we need to optimize his pulmonary function to make him the best possible candidate for transplant. We discussed that his continued smoking is a barrier to optimizing his pulmonary function. We discussed that he needs to quit smoking entirely. He states he is using nicotine patches, but that he continues to smoke 1 PPD. He states he will work on quitting. I have told him we are here to help, but he needs to carry  the majority of the weight to quit. We also discussed the fact that his  Body mass index is 39.42 kg/m. This is another barrier to both his pulmonary function and his candidacy for transplantation. He has been able to lose weight in the past, so we discussed him working on this again. We discussed taking short walks and gradually going shorter distances. We will refer to weight loss clinic.  He understands he has some work to do, but that he can in fact improve his pulmonary status and his ability to qualify for renal transplant if he works hard on smoking cessation and weight loss between now and 10/2019.We have a plan and he has goals to meet. He denies any fever, chest pain, orthopnea or hemoptysis.   Test Results: PFTs: 05/16/2019 - FVC 4.07 (77%), FEV1 3.28 (81%), ratio 81. No BD response. DLCOunc 81% No obstruction, moderate restriction, improved DLCO. CT chest 05/2015 -evidence of old granulomatous disease with calcified right hilar lymph nodes, bilateral subcentimeter nodules , some new compared to 02/2010 CT chest3/2018stable nodules , somecalcified and probably benign CT chest 01/2018 -stable nodules   PSG 01/2015- severe OSA, AHI 99/hour, not corrected by CPAP required BiPAP 25/21 with a full face mask  PFTs 10/2015 >> no obsn, mod restriction, DLCO 59%. (no mention of ILD on Serial CT chest 2016 -2019 )  10/2015 2-D echo nml LVEF, grade 1 diastolic dysfunction  CBC Latest Ref Rng & Units 03/18/2019 03/14/2019 02/23/2019  WBC 4.0 - 10.5 K/uL - - 4.3  Hemoglobin 13.5 - 17.5 11.2(A) 10.6(A) 9.1(L)  Hematocrit 39.0 - 52.0 % - - 29.8(L)  Platelets 150 - 400 K/uL - - 110(L)    BMP Latest Ref Rng & Units 02/28/2019 02/23/2019 02/22/2019  Glucose 70 - 99 mg/dL - 243(H) 138(H)  BUN 6 - 20 mg/dL - 65(H) 79(H)  Creatinine 0.6 - 1.3 8.5(A) 8.30(H) 9.77(H)  BUN/Creat Ratio 9 - 20 - - -  Sodium 135 - 145 mmol/L - 138 141  Potassium 3.5 - 5.1 mmol/L - 3.9 4.4  Chloride 98 - 111 mmol/L - 100  103  CO2 22 - 32 mmol/L - 25 25  Calcium 8.9 - 10.3 mg/dL - 8.7(L) 8.8(L)    BNP    Component Value Date/Time   BNP 457.1 (H) 02/20/2019 1454   BNP 29.5 04/27/2016 1300    ProBNP    Component Value Date/Time   PROBNP 225.0 (H) 07/16/2015 1158    PFT    Component Value Date/Time   FEV1PRE 3.26 05/16/2019 1046   FEV1POST 3.28 05/16/2019 1046   FVCPRE 4.08 05/16/2019 1046   FVCPOST 4.07 05/16/2019 1046   TLC 5.72 05/16/2019 1046   DLCOUNC 24.66 05/16/2019 1046   PREFEV1FVCRT 80 05/16/2019 1046   PSTFEV1FVCRT 81 05/16/2019 1046    No results found.   Past medical hx Past Medical History:  Diagnosis Date   Acute diastolic heart failure (HCC)    CHF (congestive heart failure) ([place])    Diabetes mellitus with nephropathy ([place]) 05/12/2012   Dr. Justin Mend follows   Hemorrhoids 05/12/2012   Hyperlipidemia    Hypertension    Lumbar spinal stenosis 05/03/2012   Mild with only right L4 nerve root encroachment, no neurogenic claudication    Lumbar spondylosis 05/03/2012   Meralgia paraesthetica 05/03/2012   Meralgia paraesthetica 05/03/2012   On Lyrica which does improve pain.    Neuropathy in diabetes ([place]) 05/12/2012   Obesity    Pneumonia    Retinopathy    Sleep apnea    uses cpap   Substance abuse ([place])    Urinary incontinence 05/12/2012   Since the start of Sept. 2013      Social History   Tobacco Use   Smoking status: Current Every Day Smoker    Packs/day: 1.00    Years: 30.00    Pack years: 30.00    Types: Cigarettes   Smokeless tobacco: Never Used   Tobacco comment: down to .5ppd 05/16/2019  Substance Use Topics   Alcohol use: No    Comment: " about 40 ounce beer per month."  down to a beer every 3 weeks   Drug use: No    Types: Marijuana, Cocaine    Comment: marijuana "laced with something"; today, stated "no" to question of illegal drug use 06/16/2016    Jerry Cantrell reports that he has been smoking cigarettes. He has a 30.00 pack-year  smoking history. He has never used smokeless tobacco. He reports that he does not drink alcohol or use drugs.  Tobacco Cessation: Current every day smoker with a 30 pack year smoking history  Past surgical hx, Family hx, Social hx all reviewed.  Current Outpatient Medications on File Prior to Visit  Medication Sig   ACCU-CHEK AVIVA PLUS test strip USE ONE STRIP 5 (FIVE) TIMES DAILY AS INSTRUCTED   Accu-Chek Softclix Lancets lancets Use as instructed   atorvastatin (LIPITOR) 80 MG tablet Take 1 tablet (80 mg total) by mouth daily at 6 PM.   bacitracin-polymyxin b (POLYSPORIN) ointment Apply topically  2 (two) times daily.   carvedilol (COREG) 25 MG tablet Take 25 mg by mouth 2 (two) times daily with a meal.   cetirizine (ZYRTEC) 10 MG tablet TAKE ONE TABLET BY MOUTH ONCE DAILY FOR ALLERGIES   cinacalcet (SENSIPAR) 60 MG tablet Take 60 mg by mouth daily.   clobetasol cream (TEMOVATE) 0.05 % APPLY TO AFFECED ARES(S) TOPICALLY TWICE DAILY   Continuous Blood Gluc Receiver (FREESTYLE LIBRE 14 DAY READER) DEVI 1 application by Does not apply route 3 (three) times daily. Use to check blood glucoses   Continuous Blood Gluc Receiver (FREESTYLE LIBRE 2 READER SYSTM) DEVI 1 Device by Does not apply route as directed.   dextrose (GLUTOSE) 40 % GEL Take 37.5 g by mouth once as needed for up to 1 dose for low blood sugar.   fluconazole (DIFLUCAN) 100 MG tablet Take 200 mg by mouth once a week.    fluticasone (FLONASE) 50 MCG/ACT nasal spray USE TWO SPRAYS INTO EACH NOSTRILS DAILY   folic acid-vitamin b complex-vitamin c-selenium-zinc (DIALYVITE) 3 MG TABS tablet Take 1 tablet by mouth daily.   furosemide (LASIX) 80 MG tablet Take 1 tablet (80 mg total) by mouth every Tuesday, Thursday, Saturday, and Sunday.   GAVILAX powder TAKE ONE CAPFUL (17 GRAMS) BY MOUTH 2 (TWO) TIMES DAILY AS NEEDED. (Patient taking differently: Take 17 g by mouth 2 (two) times daily as needed for mild constipation. )     hydrALAZINE (APRESOLINE) 100 MG tablet TAKE 1 TABLET (100 MG TOTAL) BY MOUTH 3 (THREE) TIMES DAILY.   Insulin Glargine (LANTUS SOLOSTAR) 100 UNIT/ML Solostar Pen Inject 45 Units into the skin daily. INJECT 30 UNITS UNDER THE SKIN EVERY MORNING AND INJECT 15 UNITS EVERY NIGHT (Patient taking differently: Inject 45 Units into the skin daily. )   insulin lispro (HUMALOG KWIKPEN) 100 UNIT/ML KwikPen Inject 0.08 mLs (8 Units total) into the skin 3 (three) times daily. Max daily dose of 40 units daily (Patient taking differently: Inject 10-20 Units into the skin 3 (three) times daily. Max daily dose of 40 units daily)   Insulin Pen Needle (B-D UF III MINI PEN NEEDLES) 31G X 5 MM MISC Four times daily   lidocaine-prilocaine (EMLA) cream APPLY SMALL AMOUNT TO ACCESS SITE (AVF) 1 TO 2 HOURS BEFORE DIALYSIS. COVER WITH OCCLUSIVE DRESSING (S[place]AN [place]AP)   NARCAN 4 MG/0.1ML LIQD nasal spray kit Place 2 mLs into the nose See admin instructions. May repeat dose every 2 to 3 minutes until responsive or ems arrives.   Nicotine 21-14-7 MG/24HR KIT Place 1 patch onto the skin daily.   Olopatadine HCl 0.2 % SOLN APPLY 1 DROP TO EYE DAILY.   Oxycodone HCl 10 MG TABS Take 10 mg by mouth every 8 (eight) hours.   pregabalin (LYRICA) 100 MG capsule TAKE 1 CAPSULE BY MOUTH IN THE MORNING AND 1 CAPSULE IN THE EVENING   silver sulfADIAZINE (SILVADENE) 1 % cream Apply 1 application topically daily.   verapamil (CALAN-SR) 240 MG CR tablet Take 1 tablet (240 mg total) by mouth at bedtime.   No current facility-administered medications on file prior to visit.      No Known Allergies  Review Of Systems:  Constitutional:   No  weight loss, night sweats,  Fevers, chills, + fatigue, or  lassitude.  HEENT:   No headaches,  Difficulty swallowing,  Tooth/dental problems, or  Sore throat,                No sneezing, itching, ear  ache, nasal congestion, post nasal drip,   CV:  No chest pain,  Orthopnea, PND, +  swelling in lower extremities, No anasarca, dizziness, palpitations, syncope.   GI  No heartburn, indigestion, abdominal pain, nausea, vomiting, diarrhea, change in bowel habits, loss of appetite, bloody stools.   Resp: + shortness of breath with exertion or at rest.  No excess mucus, no productive cough,  No non-productive cough,  No coughing up of blood.  No change in color of mucus.  No wheezing.  No chest wall deformity  Skin: no rash or lesions., warm and dry  GU: no dysuria, change in color of urine, no urgency or frequency.  No flank pain, no hematuria   MS:  No joint pain or swelling.  No decreased range of motion.  + chronic  back pain.  Psych:  No change in mood or affect. No depression or anxiety.  No memory loss.   Vital Signs BP 122/62 (BP Location: Right Arm, Cuff Size: Large)    Pulse 72    Ht 5' 11.5" (1.816 m)    Wt 286 lb 9.6 oz (130 kg)    SpO2 96%    BMI 39.42 kg/m    Physical Exam:  General- No distress,  A&Ox3, calm and appropriate ENT: No sinus tenderness, TM clear, pale nasal mucosa, no oral exudate,no post nasal drip, no LAN Cardiac: S1, S2, regular rate and rhythm, no murmur Chest: No wheeze/ rales/ dullness; no accessory muscle use, no nasal flaring, no sternal retractions, diminished per bases, few crackles per bases>> for HD today Abd.: Soft Non-tender, ND, BS + Ext: No clubbing cyanosis, 1+ generalized edema Neuro:  normal strength, MAE x 4, A&O x 3 Skin: No rashes, No lesions, warm and dry Psych: normal mood and behavior   Assessment/Plan  Obstructive sleep apnea Compliant with BiPAP Needs setting adjusted per 03/2019 BiPAP titration Plan Optimal pressure is ( 28 /23 cm of water) per your 03/2019 BiPap Titration We will send in an order to adjust your BiPAP settings to what was recommended per your BiPap titration study. Follow up in 5 weeks with Down Load to ensure improvement of AHI Continue on BiPAP at bedtime. You appear to be benefiting from  the treatment  Goal is to wear for at least 6 hours each night for maximal clinical benefit. Continue to work on weight loss, as the link between excess weight  and sleep apnea is well established.   Remember to establish a good bedtime routine, and work on sleep hygiene.  Limit daytime naps , avoid stimulants such as caffeine and nicotine close to bedtime, exercise daily to promote sleep quality, avoid heavy , spicy, fried , or rich foods before bed. Ensure adequate exposure to natural light during the day,establish a relaxing bedtime routine with a pleasant sleep environment ( Bedroom between 60 and 67 degrees, turn off bright lights , TV or device screens screens , consider black out curtains or white noise machines) Do not drive if sleepy. Remember to clean mask, tubing, filter, and reservoir once weekly with soapy water.  Follow up with Judson Roch NP  in 5 weeks per video call to review download on new settings.    ESRD (end stage renal disease) ([place]) Dry weight is 119 KG Weight today 130 KG For HD today Usually pull 10-15 pounds of fluid per HD session per patient Plan Continue HD Monday Wednesday Friday without fail. Continue lasix on non-HD days. Monitor weight and edema   Tobacco abuse  Discussed need to quit smoking to improve chances of qualifying for renal transplant.  Currently smoking daily Plan Work hard on quitting smoking. Given " Be stronger than your excuses" card today with options for free nicotine replacement Will need to be referred for lung cancer screening when he is 71  Morbid obesity with BMI of 40.0-44.9, adult ([place]) Discussed need to work on losing weight for both his OSA and his pulmonary status Plan Encouraged to work on diet and exercise    Magdalen Spatz, NP 06/05/2019  4:16 PM

## 2019-06-05 NOTE — Patient Instructions (Addendum)
It is good to see you today. You are benefiting from your BiPap use Optimal pressure is ( 28 /23 cm of water) per your 03/2019 BiPap Titration We will send in an order to adjust your BiPAP settings to what was recommended per your BiPap titration study. Follow up in 5 weeks with Down Load We need to optimize your pulmonary status . You need to Quit smoking completely.  Keep Working  on losing weight . This is an important element of the transplant screening process. Work on exercising to improve your strength and help with weight loss.  Keep up the great work by abstaining from use of any drugs that are not prescribed.     Continue on BiPAP at bedtime. You appear to be benefiting from the treatment  Goal is to wear for at least 6 hours each night for maximal clinical benefit. Continue to work on weight loss, as the link between excess weight  and sleep apnea is well established.   Remember to establish a good bedtime routine, and work on sleep hygiene.  Limit daytime naps , avoid stimulants such as caffeine and nicotine close to bedtime, exercise daily to promote sleep quality, avoid heavy , spicy, fried , or rich foods before bed. Ensure adequate exposure to natural light during the day,establish a relaxing bedtime routine with a pleasant sleep environment ( Bedroom between 60 and 67 degrees, turn off bright lights , TV or device screens screens , consider black out curtains or white noise machines) Do not drive if sleepy. Remember to clean mask, tubing, filter, and reservoir once weekly with soapy water.  Follow up with Judson Roch NP  in 5 weeks per video call to review download on new settings.

## 2019-06-05 NOTE — Assessment & Plan Note (Signed)
Discussed need to quit smoking to improve chances of qualifying for renal transplant.  Currently smoking daily Plan Work hard on quitting smoking. Given " Be stronger than your excuses" card today with options for free nicotine replacement Will need to be referred for lung cancer screening when he is 30

## 2019-06-05 NOTE — Assessment & Plan Note (Signed)
Compliant with BiPAP Needs setting adjusted per 03/2019 BiPAP titration Plan Optimal pressure is ( 28 /23 cm of water) per your 03/2019 BiPap Titration We will send in an order to adjust your BiPAP settings to what was recommended per your BiPap titration study. Follow up in 5 weeks with Down Load to ensure improvement of AHI Continue on BiPAP at bedtime. You appear to be benefiting from the treatment  Goal is to wear for at least 6 hours each night for maximal clinical benefit. Continue to work on weight loss, as the link between excess weight  and sleep apnea is well established.   Remember to establish a good bedtime routine, and work on sleep hygiene.  Limit daytime naps , avoid stimulants such as caffeine and nicotine close to bedtime, exercise daily to promote sleep quality, avoid heavy , spicy, fried , or rich foods before bed. Ensure adequate exposure to natural light during the day,establish a relaxing bedtime routine with a pleasant sleep environment ( Bedroom between 60 and 67 degrees, turn off bright lights , TV or device screens screens , consider black out curtains or white noise machines) Do not drive if sleepy. Remember to clean mask, tubing, filter, and reservoir once weekly with soapy water.  Follow up with Judson Roch NP  in 5 weeks per video call to review download on new settings.

## 2019-06-06 ENCOUNTER — Encounter: Payer: Self-pay | Admitting: Podiatry

## 2019-06-06 ENCOUNTER — Ambulatory Visit (INDEPENDENT_AMBULATORY_CARE_PROVIDER_SITE_OTHER): Payer: Medicare Other | Admitting: Podiatry

## 2019-06-06 DIAGNOSIS — E0822 Diabetes mellitus due to underlying condition with diabetic chronic kidney disease: Secondary | ICD-10-CM

## 2019-06-06 DIAGNOSIS — B351 Tinea unguium: Secondary | ICD-10-CM

## 2019-06-06 DIAGNOSIS — L84 Corns and callosities: Secondary | ICD-10-CM | POA: Diagnosis not present

## 2019-06-06 DIAGNOSIS — Z992 Dependence on renal dialysis: Secondary | ICD-10-CM

## 2019-06-06 DIAGNOSIS — N186 End stage renal disease: Secondary | ICD-10-CM

## 2019-06-06 DIAGNOSIS — Z794 Long term (current) use of insulin: Secondary | ICD-10-CM

## 2019-06-06 NOTE — Patient Instructions (Signed)
Diabetes Mellitus and Foot Care Foot care is an important part of your health, especially when you have diabetes. Diabetes may cause you to have problems because of poor blood flow (circulation) to your feet and legs, which can cause your skin to:  Become thinner and drier.  Break more easily.  Heal more slowly.  Peel and crack. You may also have nerve damage (neuropathy) in your legs and feet, causing decreased feeling in them. This means that you may not notice minor injuries to your feet that could lead to more serious problems. Noticing and addressing any potential problems early is the best way to prevent future foot problems. How to care for your feet Foot hygiene  Wash your feet daily with warm water and mild soap. Do not use hot water. Then, pat your feet and the areas between your toes until they are completely dry. Do not soak your feet as this can dry your skin.  Trim your toenails straight across. Do not dig under them or around the cuticle. File the edges of your nails with an emery board or nail file.  Apply a moisturizing lotion or petroleum jelly to the skin on your feet and to dry, brittle toenails. Use lotion that does not contain alcohol and is unscented. Do not apply lotion between your toes. Shoes and socks  Wear clean socks or stockings every day. Make sure they are not too tight. Do not wear knee-high stockings since they may decrease blood flow to your legs.  Wear shoes that fit properly and have enough cushioning. Always look in your shoes before you put them on to be sure there are no objects inside.  To break in new shoes, wear them for just a few hours a day. This prevents injuries on your feet. Wounds, scrapes, corns, and calluses  Check your feet daily for blisters, cuts, bruises, sores, and redness. If you cannot see the bottom of your feet, use a mirror or ask someone for help.  Do not cut corns or calluses or try to remove them with medicine.  If you  find a minor scrape, cut, or break in the skin on your feet, keep it and the skin around it clean and dry. You may clean these areas with mild soap and water. Do not clean the area with peroxide, alcohol, or iodine.  If you have a wound, scrape, corn, or callus on your foot, look at it several times a day to make sure it is healing and not infected. Check for: ? Redness, swelling, or pain. ? Fluid or blood. ? Warmth. ? Pus or a bad smell. General instructions  Do not cross your legs. This may decrease blood flow to your feet.  Do not use heating pads or hot water bottles on your feet. They may burn your skin. If you have lost feeling in your feet or legs, you may not know this is happening until it is too late.  Protect your feet from hot and cold by wearing shoes, such as at the beach or on hot pavement.  Schedule a complete foot exam at least once a year (annually) or more often if you have foot problems. If you have foot problems, report any cuts, sores, or bruises to your health care provider immediately. Contact a health care provider if:  You have a medical condition that increases your risk of infection and you have any cuts, sores, or bruises on your feet.  You have an injury that is not   healing.  You have redness on your legs or feet.  You feel burning or tingling in your legs or feet.  You have pain or cramps in your legs and feet.  Your legs or feet are numb.  Your feet always feel cold.  You have pain around a toenail. Get help right away if:  You have a wound, scrape, corn, or callus on your foot and: ? You have pain, swelling, or redness that gets worse. ? You have fluid or blood coming from the wound, scrape, corn, or callus. ? Your wound, scrape, corn, or callus feels warm to the touch. ? You have pus or a bad smell coming from the wound, scrape, corn, or callus. ? You have a fever. ? You have a red line going up your leg. Summary  Check your feet every day  for cuts, sores, red spots, swelling, and blisters.  Moisturize feet and legs daily.  Wear shoes that fit properly and have enough cushioning.  If you have foot problems, report any cuts, sores, or bruises to your health care provider immediately.  Schedule a complete foot exam at least once a year (annually) or more often if you have foot problems. This information is not intended to replace advice given to you by your health care provider. Make sure you discuss any questions you have with your health care provider. Document Released: 08/14/2000 Document Revised: 09/29/2017 Document Reviewed: 09/18/2016 Elsevier Patient Education  2020 [employer]   Onychomycosis/Fungal Toenails  WHAT IS IT? An infection that lies within the keratin of your nail plate that is caused by a fungus.  WHY ME? Fungal infections affect all ages, sexes, races, and creeds.  There may be many factors that predispose you to a fungal infection such as age, coexisting medical conditions such as diabetes, or an autoimmune disease; stress, medications, fatigue, genetics, etc.  Bottom line: fungus thrives in a warm, moist environment and your shoes offer such a location.  IS IT CONTAGIOUS? Theoretically, yes.  You do not want to share shoes, nail clippers or files with someone who has fungal toenails.  Walking around barefoot in the same room or sleeping in the same bed is unlikely to transfer the organism.  It is important to realize, however, that fungus can spread easily from one nail to the next on the same foot.  HOW DO WE TREAT THIS?  There are several ways to treat this condition.  Treatment may depend on many factors such as age, medications, pregnancy, liver and kidney conditions, etc.  It is best to ask your doctor which options are available to you.  1. No treatment.   Unlike many other medical concerns, you can live with this condition.  However for many people this can be a painful condition and may lead to  ingrown toenails or a bacterial infection.  It is recommended that you keep the nails cut short to help reduce the amount of fungal nail. 2. Topical treatment.  These range from herbal remedies to prescription strength nail lacquers.  About 40-50% effective, topicals require twice daily application for approximately 9 to 12 months or until an entirely new nail has grown out.  The most effective topicals are medical grade medications available through physicians offices. 3. Oral antifungal medications.  With an 80-90% cure rate, the most common oral medication requires 3 to 4 months of therapy and stays in your system for a year as the new nail grows out.  Oral antifungal medications do require   blood work to make sure it is a safe drug for you.  A liver function panel will be performed prior to starting the medication and after the first month of treatment.  It is important to have the blood work performed to avoid any harmful side effects.  In general, this medication safe but blood work is required. 4. Laser Therapy.  This treatment is performed by applying a specialized laser to the affected nail plate.  This therapy is noninvasive, fast, and non-painful.  It is not covered by insurance and is therefore, out of pocket.  The results have been very good with a 80-95% cure rate.  The [facility] is the only practice in the area to offer this therapy. 5. Permanent Nail Avulsion.  Removing the entire nail so that a new nail will not grow back. 

## 2019-06-06 NOTE — Progress Notes (Signed)
As discussed before, if he quit smoking and loses weight, his overall condition and case for transplant will be much stronger.  Will remain to be seen whether he will be able to come off BiPAP once he loses weight and transition to CPAP instead

## 2019-06-07 ENCOUNTER — Telehealth: Payer: Self-pay | Admitting: Family Medicine

## 2019-06-07 ENCOUNTER — Telehealth: Payer: Self-pay | Admitting: Internal Medicine

## 2019-06-07 ENCOUNTER — Other Ambulatory Visit: Payer: Self-pay

## 2019-06-07 DIAGNOSIS — N186 End stage renal disease: Secondary | ICD-10-CM | POA: Diagnosis not present

## 2019-06-07 DIAGNOSIS — M545 Low back pain: Secondary | ICD-10-CM | POA: Diagnosis not present

## 2019-06-07 DIAGNOSIS — D689 Coagulation defect, unspecified: Secondary | ICD-10-CM | POA: Diagnosis not present

## 2019-06-07 DIAGNOSIS — E8779 Other fluid overload: Secondary | ICD-10-CM | POA: Diagnosis not present

## 2019-06-07 DIAGNOSIS — N2581 Secondary hyperparathyroidism of renal origin: Secondary | ICD-10-CM | POA: Diagnosis not present

## 2019-06-07 DIAGNOSIS — E1122 Type 2 diabetes mellitus with diabetic chronic kidney disease: Secondary | ICD-10-CM | POA: Diagnosis not present

## 2019-06-07 DIAGNOSIS — Z992 Dependence on renal dialysis: Secondary | ICD-10-CM | POA: Diagnosis not present

## 2019-06-07 DIAGNOSIS — E875 Hyperkalemia: Secondary | ICD-10-CM | POA: Diagnosis not present

## 2019-06-07 DIAGNOSIS — Z79899 Other long term (current) drug therapy: Secondary | ICD-10-CM | POA: Diagnosis not present

## 2019-06-07 MED ORDER — INSULIN LISPRO (1 UNIT DIAL) 100 UNIT/ML (KWIKPEN): 8.0000 [IU] | PEN_INJECTOR | Freq: Three times a day (TID) | SUBCUTANEOUS | 0 refills | Status: DC

## 2019-06-07 NOTE — Telephone Encounter (Signed)
According to pt he is using 100 units daily, please refill.

## 2019-06-07 NOTE — Telephone Encounter (Signed)
Patient requests new RX for below with dosage change-patient is taking the below medication 6-7 times per day. Patient has been eating more fruit due to dialysis.  MEDICATION: insulin lispro (HUMALOG KWIKPEN) 100 UNIT/ML KwikPen  PHARMACY:   [facility] [phone] ([phone] (Fax)     IS THIS A 90 DAY SUPPLY : Yes-please  IS PATIENT OUT OF MEDICATION: Yes  IF NOT; HOW MUCH IS LEFT: 0  LAST APPOINTMENT DATE: @Visit  date not found  NEXT APPOINTMENT DATE:@10 /05/2019  DO WE HAVE YOUR PERMISSION TO LEAVE A DETAILED MESSAGE:  Yes  OTHER COMMENTS: Patient requests new RX for above be sent asap.  **Let patient know to contact pharmacy at the end of the day to make sure medication is ready. **  ** Please notify patient to allow 48-72 hours to process**  **Encourage patient to contact the pharmacy for refills or they can request refills through [facility]**

## 2019-06-07 NOTE — Telephone Encounter (Signed)
Pt called and wanted to see if Dr. Nolon Rod would send him a script for Humalog in to his pharmacy. He states he is eating more fruit and having to use more insulin. I told him it looks like Dr. Nolon Rod does not prescribe the medication. I told him to contact his Endocrinologist and he said that he will but he states that they don't follow up with him on his health. He still wanted me to put in the message to Dr. Nolon Rod. Please advise.

## 2019-06-07 NOTE — Telephone Encounter (Signed)
Pt was already scheduled for appt  06/09/2019

## 2019-06-08 ENCOUNTER — Other Ambulatory Visit: Payer: Self-pay | Admitting: *Deleted

## 2019-06-08 ENCOUNTER — Encounter: Payer: Self-pay | Admitting: *Deleted

## 2019-06-08 ENCOUNTER — Telehealth: Payer: Self-pay | Admitting: Acute Care

## 2019-06-08 DIAGNOSIS — M545 Low back pain: Secondary | ICD-10-CM | POA: Diagnosis not present

## 2019-06-08 DIAGNOSIS — E875 Hyperkalemia: Secondary | ICD-10-CM | POA: Diagnosis not present

## 2019-06-08 DIAGNOSIS — E8779 Other fluid overload: Secondary | ICD-10-CM | POA: Diagnosis not present

## 2019-06-08 DIAGNOSIS — G4733 Obstructive sleep apnea (adult) (pediatric): Secondary | ICD-10-CM

## 2019-06-08 DIAGNOSIS — E1122 Type 2 diabetes mellitus with diabetic chronic kidney disease: Secondary | ICD-10-CM | POA: Diagnosis not present

## 2019-06-08 DIAGNOSIS — N2581 Secondary hyperparathyroidism of renal origin: Secondary | ICD-10-CM | POA: Diagnosis not present

## 2019-06-08 DIAGNOSIS — N186 End stage renal disease: Secondary | ICD-10-CM | POA: Diagnosis not present

## 2019-06-08 DIAGNOSIS — Z992 Dependence on renal dialysis: Secondary | ICD-10-CM | POA: Diagnosis not present

## 2019-06-08 DIAGNOSIS — M25561 Pain in right knee: Secondary | ICD-10-CM | POA: Diagnosis not present

## 2019-06-08 DIAGNOSIS — M25562 Pain in left knee: Secondary | ICD-10-CM | POA: Diagnosis not present

## 2019-06-08 DIAGNOSIS — Z79899 Other long term (current) drug therapy: Secondary | ICD-10-CM | POA: Diagnosis not present

## 2019-06-08 DIAGNOSIS — D689 Coagulation defect, unspecified: Secondary | ICD-10-CM | POA: Diagnosis not present

## 2019-06-08 NOTE — Telephone Encounter (Signed)
Called Jerry Cantrell but there was no answer. LM for her to call back and advise what to change on order.

## 2019-06-08 NOTE — Patient Outreach (Addendum)
Outreach call to pt for telephone assessment, spoke with pt and he is at dialysis but states he is able to talk, pt did receive scale and weighs "few times weekly or when I can", weight checked at dialysis and pt states weight is 129 kg, pt states " dialysis is rough"  CBG readings "about the same, some 100's range, some in 200's"  Pt did make endocrinologist appointment, continues attending pain clinic and is to see primary care in the near future- per pt.  Pt states he is taking medication as prescribed, pt states he will be seeing neurologist in the near future (does not know the name of doctor) due to having more difficulty walking and is in wheelchair a lot more.  Pt is having some difficulty advancing through further goals/ new goals with the plan of care, he has made progress but seems to get aggravated with questions asked, does not seem ready to change some behaviors such as smoking.  Pt states he does have resources at dialysis and can ask for assistance if needed (such as social work).  RN CM reinforced HF action plan and importance of symptom management, pt does not feel HF is problematic for him, is not going to weigh daily, feels that ESRD/ dialysis and diabetes have been the biggest concerns for him and does not verbalize interest in working on further goals.  THN CM Care Plan Problem One     Most Recent Value  Care Plan Problem One  Knowledge deficit related to diabetes  Role Documenting the Problem One  Care Management [place] for Problem One  Active  THN Long Term Goal   Pt will demonstrate/ verbalize improved self care related to diabetes within 60 days  THN Long Term Goal Start Date  05/12/19 [goal re-established]  THN Long Term Goal Met Date  06/08/19  Interventions for Problem One Long Term Goal  RN CM reinforced plan of care with pt, pt continues checking CBG, did receive scale and weighs "maybe several times weekly", continues dialysis 3 times weekly, pt now has  endocrinologist appointment, last AIC 6.4 in April 2020.,  [facility] CM Short Term Goal #1   Pt will verbalize plate method and practice portion control within 30 days  THN CM Short Term Goal #1 Start Date  05/12/19 Barrie Folk re-established   pt needs reinforcement]  THN CM Short Term Goal #1 Met Date  06/08/19  Interventions for Short Term Goal #1  RN CM reinforced plate method, food choices and impact on blood sugar.    THN CM Care Plan Problem Two     Most Recent Value  Care Plan Problem Two  End stage renal disease (new to dialysis)  Role Documenting the Problem Two  Care Management [place] for Problem Two  Active  THN CM Short Term Goal #1   Pt will verbalize interventions for positive health changes/ actions to facilitate successful dialysis/ goals within 30 days  THN CM Short Term Goal #1 Start Date  02/28/19  [facility] CM Short Term Goal #1 Met Date   04/11/19      PLAN Close case Case closure letter faxed to primary MD Case closure letter mailed to pt home  Jacqlyn Larsen [facility], [place] Coordinator [phone]

## 2019-06-08 NOTE — Progress Notes (Signed)
Subjective: Jerry Cantrell is a 53 y.o. y.o. male who presents today for preventative diabetic foot care.  He relates he has been having problems with his legs becoming weak. He has had to use a wheelchair at times. He has been referred to Neurology and is waiting for that appointment.   Forrest Moron, MD is his PCP.   Current Outpatient Medications on File Prior to Visit  Medication Sig Dispense Refill  . Fluticasone Furoate (ARNUITY ELLIPTA) 50 MCG/ACT AEPB Inhale into the lungs.    . heparin 1000 UNIT/ML injection Heparin Sodium (Porcine) 1,000 Units/mL Systemic    . tuberculin (APLISOL) 5 UNIT/0.1ML injection Inject into the skin.    Marland Kitchen ACCU-CHEK AVIVA PLUS test strip USE ONE STRIP 5 (FIVE) TIMES DAILY AS INSTRUCTED 400 each 0  . Accu-Chek Softclix Lancets lancets Use as instructed 450 each 0  . atorvastatin (LIPITOR) 80 MG tablet Take 1 tablet (80 mg total) by mouth daily at 6 PM. 90 tablet 0  . AURYXIA 1 GM 210 MG(Fe) tablet TAKE 3 TABLETS BY MOUTH WITH MEALS AND 1 TABLET WITH SNACKS   SWALLOW WHOLE, DO NOT CHEW OR CRUSH MEDICATION    . bacitracin-polymyxin b (POLYSPORIN) ointment Apply topically 2 (two) times daily. 15 g 0  . carvedilol (COREG) 25 MG tablet Take 25 mg by mouth 2 (two) times daily with a meal.    . cetirizine (ZYRTEC) 10 MG tablet TAKE ONE TABLET BY MOUTH ONCE DAILY FOR ALLERGIES 90 tablet 0  . cinacalcet (SENSIPAR) 60 MG tablet Take 60 mg by mouth daily.    . clobetasol cream (TEMOVATE) 0.05 % APPLY TO AFFECED ARES(S) TOPICALLY TWICE DAILY 45 g 0  . Continuous Blood Gluc Receiver (FREESTYLE LIBRE 14 DAY READER) DEVI 1 application by Does not apply route 3 (three) times daily. Use to check blood glucoses 2 Device 11  . Continuous Blood Gluc Receiver (FREESTYLE LIBRE 2 READER SYSTM) DEVI 1 Device by Does not apply route as directed. 1 Device 0  . dextrose (GLUTOSE) 40 % GEL Take 37.5 g by mouth once as needed for up to 1 dose for low blood sugar. 37.5 g 1  . fluconazole  (DIFLUCAN) 100 MG tablet Take 200 mg by mouth once a week.     . fluticasone (FLONASE) 50 MCG/ACT nasal spray USE TWO SPRAYS INTO EACH NOSTRILS DAILY 16 g 6  . folic acid-vitamin b complex-vitamin c-selenium-zinc (DIALYVITE) 3 MG TABS tablet Take 1 tablet by mouth daily.    . furosemide (LASIX) 80 MG tablet Take 1 tablet (80 mg total) by mouth every Tuesday, Thursday, Saturday, and Sunday. 120 tablet 0  . GAVILAX powder TAKE ONE CAPFUL (17 GRAMS) BY MOUTH 2 (TWO) TIMES DAILY AS NEEDED. (Patient taking differently: Take 17 g by mouth 2 (two) times daily as needed for mild constipation. ) 510 g 1  . hydrALAZINE (APRESOLINE) 100 MG tablet TAKE 1 TABLET (100 MG TOTAL) BY MOUTH 3 (THREE) TIMES DAILY. 270 tablet 1  . Insulin Glargine (LANTUS SOLOSTAR) 100 UNIT/ML Solostar Pen Inject 45 Units into the skin daily. INJECT 30 UNITS UNDER THE SKIN EVERY MORNING AND INJECT 15 UNITS EVERY NIGHT (Patient taking differently: Inject 45 Units into the skin daily. ) 15 mL 6  . Insulin Pen Needle (B-D UF III MINI PEN NEEDLES) 31G X 5 MM MISC Four times daily 360 each 0  . lidocaine-prilocaine (EMLA) cream APPLY SMALL AMOUNT TO ACCESS SITE (AVF) 1 TO 2 HOURS BEFORE DIALYSIS. COVER WITH OCCLUSIVE  DRESSING [facility] WRAP)    . mupirocin ointment (BACTROBAN) 2 % APPLY THIN LAYER INSIDE EACH NOSTRIL TWICE A DAY FOR  5 DAYS    . NARCAN 4 MG/0.1ML LIQD nasal spray kit Place 2 mLs into the nose See admin instructions. May repeat dose every 2 to 3 minutes until responsive or ems arrives.    . Nicotine 21-14-7 MG/24HR KIT Place 1 patch onto the skin daily. 56 each 0  . Olopatadine HCl 0.2 % SOLN APPLY 1 DROP TO EYE DAILY. 2.5 mL 2  . Oxycodone HCl 10 MG TABS Take 10 mg by mouth every 8 (eight) hours.    . pregabalin (LYRICA) 100 MG capsule TAKE 1 CAPSULE BY MOUTH IN THE MORNING AND 1 CAPSULE IN THE EVENING    . rOPINIRole (REQUIP) 0.25 MG tablet Take 0.25 mg by mouth at bedtime.    . silver sulfADIAZINE (SILVADENE) 1 % cream Apply  1 application topically daily. 50 g 0  . verapamil (CALAN-SR) 240 MG CR tablet Take 1 tablet (240 mg total) by mouth at bedtime. 90 tablet 3   No current facility-administered medications on file prior to visit.     No Known Allergies  Objective:  Vascular Examination: Capillary refill time immediate x 10 digits.  Dorsalis pedis pulses 1/4 b/l.  Posterior tibial pulses nonpalpable b/l.  Digital hair diminished b/l.  Skin temperature gradient WNL b/l.  Dermatological Examination: Skin with normal turgor, texture and tone b/l.  Toenails 1-5 b/l discolored, thick, dystrophic with subungual debris and pain with palpation to nailbeds due to thickness of nails.  Hyperkeratotic lesions b/l hallux and b/l heels. No erythema, no edema, no drainage, no flocculence noted.   Porokeratotic lesions submet with tenderness to palpation. No erythema, no edema, no drainage, no flocculence.   Musculoskeletal: Muscle strength 5/5 b/l to all LE muscle groups.  HAV b/l feet.  Hammertoes b/l.  Neurological: Sensation intact 5/5 b/l with 10 gram monofilament.  Vibratory sensation b/l.  Assessment: 1. Painful onychomycosis toenails 1-5 b/l 2.  Calluses b/l hallux and b/l heels 3.  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 Continue diabetic foot care principles. Literature dispensed on today. 2. Toenails 1-5 b/l were debrided in length and girth without iatrogenic bleeding. 3. Hyperkeratotic lesions b/l hallux and b/l heels pared with sterile scalpel blade without incident. 4. Patient to continue soft, supportive shoe gear daily. 5. Patient to report any pedal injuries to medical professional immediately. 6. Follow up 3 months.  7. Patient/POA to call should there be a concern in the interim.

## 2019-06-09 ENCOUNTER — Ambulatory Visit: Payer: Medicare Other | Admitting: Internal Medicine

## 2019-06-09 DIAGNOSIS — M545 Low back pain: Secondary | ICD-10-CM | POA: Diagnosis not present

## 2019-06-09 DIAGNOSIS — Z992 Dependence on renal dialysis: Secondary | ICD-10-CM | POA: Diagnosis not present

## 2019-06-09 DIAGNOSIS — Z79899 Other long term (current) drug therapy: Secondary | ICD-10-CM | POA: Diagnosis not present

## 2019-06-09 DIAGNOSIS — E1122 Type 2 diabetes mellitus with diabetic chronic kidney disease: Secondary | ICD-10-CM | POA: Diagnosis not present

## 2019-06-09 DIAGNOSIS — D689 Coagulation defect, unspecified: Secondary | ICD-10-CM | POA: Diagnosis not present

## 2019-06-09 DIAGNOSIS — N186 End stage renal disease: Secondary | ICD-10-CM | POA: Diagnosis not present

## 2019-06-09 DIAGNOSIS — E875 Hyperkalemia: Secondary | ICD-10-CM | POA: Diagnosis not present

## 2019-06-09 DIAGNOSIS — N2581 Secondary hyperparathyroidism of renal origin: Secondary | ICD-10-CM | POA: Diagnosis not present

## 2019-06-09 DIAGNOSIS — E8779 Other fluid overload: Secondary | ICD-10-CM | POA: Diagnosis not present

## 2019-06-09 NOTE — Telephone Encounter (Signed)
Pt also calling stating that he is having trouble w/cpap machine.Hillery Hunter

## 2019-06-09 NOTE — Telephone Encounter (Signed)
Spoke with Melissa, she states the home bipap machine that pt has cannot go up to a pressure of 28. The max inspiratory pressure can only go up to 25. She needs the order changed to say max pressure 25. SG ok to change order? She did explain that the lab bipap machine may have went over 25 when he had his study done.   I called pt and he states the water is running out too fast as well. He said he may be eligible for a new bipap machine because he had this machine since 2014. We can place an order for new bipap and just request he gets a machine that can go up to a max of 28.

## 2019-06-12 DIAGNOSIS — N186 End stage renal disease: Secondary | ICD-10-CM | POA: Diagnosis not present

## 2019-06-12 DIAGNOSIS — N2581 Secondary hyperparathyroidism of renal origin: Secondary | ICD-10-CM | POA: Diagnosis not present

## 2019-06-12 DIAGNOSIS — E1122 Type 2 diabetes mellitus with diabetic chronic kidney disease: Secondary | ICD-10-CM | POA: Diagnosis not present

## 2019-06-12 DIAGNOSIS — M545 Low back pain: Secondary | ICD-10-CM | POA: Diagnosis not present

## 2019-06-12 DIAGNOSIS — D689 Coagulation defect, unspecified: Secondary | ICD-10-CM | POA: Diagnosis not present

## 2019-06-12 DIAGNOSIS — Z79899 Other long term (current) drug therapy: Secondary | ICD-10-CM | POA: Diagnosis not present

## 2019-06-12 DIAGNOSIS — E8779 Other fluid overload: Secondary | ICD-10-CM | POA: Diagnosis not present

## 2019-06-12 DIAGNOSIS — E875 Hyperkalemia: Secondary | ICD-10-CM | POA: Diagnosis not present

## 2019-06-12 DIAGNOSIS — Z992 Dependence on renal dialysis: Secondary | ICD-10-CM | POA: Diagnosis not present

## 2019-06-12 NOTE — Telephone Encounter (Signed)
Order sent to [facility] 

## 2019-06-12 NOTE — Telephone Encounter (Signed)
OK to change order to max 25 and ok to order new machine. Thanks

## 2019-06-13 ENCOUNTER — Ambulatory Visit (INDEPENDENT_AMBULATORY_CARE_PROVIDER_SITE_OTHER): Payer: Medicare Other | Admitting: Family Medicine

## 2019-06-13 ENCOUNTER — Encounter: Payer: Self-pay | Admitting: Family Medicine

## 2019-06-13 ENCOUNTER — Other Ambulatory Visit: Payer: Self-pay

## 2019-06-13 VITALS — BP 122/73 | HR 82 | Temp 99.0°F | Wt 278.2 lb

## 2019-06-13 DIAGNOSIS — Z992 Dependence on renal dialysis: Secondary | ICD-10-CM

## 2019-06-13 DIAGNOSIS — Z789 Other specified health status: Secondary | ICD-10-CM | POA: Diagnosis not present

## 2019-06-13 DIAGNOSIS — N186 End stage renal disease: Secondary | ICD-10-CM

## 2019-06-13 DIAGNOSIS — G894 Chronic pain syndrome: Secondary | ICD-10-CM | POA: Diagnosis not present

## 2019-06-13 DIAGNOSIS — Z794 Long term (current) use of insulin: Secondary | ICD-10-CM

## 2019-06-13 DIAGNOSIS — E1165 Type 2 diabetes mellitus with hyperglycemia: Secondary | ICD-10-CM | POA: Diagnosis not present

## 2019-06-13 MED ORDER — ONDANSETRON HCL 4 MG PO TABS: 4.0000 mg | ORAL_TABLET | Freq: Three times a day (TID) | ORAL | 0 refills | Status: AC | PRN

## 2019-06-13 NOTE — Progress Notes (Signed)
Established Patient Office Visit  Subjective:  Patient ID: Jerry Cantrell, male    DOB: 1966-07-25  Age: 52 y.o. MRN: [id number]  CC:  Chief Complaint  Patient presents with  . Extremity Weakness    patient has been having neurological issues and weaknesss of the extremities. Could not go through the HM due to patient recieved a phone call from another dr office. can go through at the end of visit or next visit    HPI Jerry Cantrell presents for   Neuropathy and ESRD Pt reports that he has been feeling very weak He states that he has been having the shakes He reports that he was wobbling and even fell because he feels like something is going on.  He states that he was able to drive to his dialysis He reports that he just feels weak  Diabetes He reports that he has neuropathy and chronic diabetes He states that he has been eating fruits and just does not know what to eat but he knows he cannot eat junk food He is using lispro 80 units a day or more. He is using lantus 45 units. His fasting glucose is consistently over 200s in the 250-280s  Depression He reports that he will be going to PT for his deconditioning He feels mentally drained He does not feel suicidal but feels like he does not know how much longer his body can keep going like this. Depression screen [facility] 2/9 06/13/2019 03/20/2019 02/28/2019 02/28/2019 02/28/2019  Decreased Interest 0 0 0 0 0  Down, Depressed, Hopeless 0 0 0 0 0  PHQ - 2 Score 0 0 0 0 0  Some recent data might be hidden      Past Medical History:  Diagnosis Date  . Acute diastolic heart failure ([place])   . CHF (congestive heart failure) ([place])   . Diabetes mellitus with nephropathy ([place]) 05/12/2012   Dr. Justin Mend follows  . Hemorrhoids 05/12/2012  . Hyperlipidemia   . Hypertension   . Lumbar spinal stenosis 05/03/2012   Mild with only right L4 nerve root encroachment, no neurogenic claudication   . Lumbar spondylosis 05/03/2012  . Meralgia paraesthetica  05/03/2012  . Meralgia paraesthetica 05/03/2012   On Lyrica which does improve pain.   Marland Kitchen Neuropathy in diabetes ([place]) 05/12/2012  . Obesity   . Pneumonia   . Retinopathy   . Sleep apnea    uses cpap  . Substance abuse ([place])   . Urinary incontinence 05/12/2012   Since the start of Sept. 2013     Past Surgical History:  Procedure Laterality Date  . ABDOMINAL SURGERY     Abscess I&D 2/2 infected hair  . AV FISTULA PLACEMENT Left 02/06/2016   Procedure: LEFT ARM RADIOCEPHALIC ARTERIOVENOUS (AV) FISTULA CREATION;  Surgeon: Rosetta Posner, MD;  Location: [facility];  Service: Vascular;  Laterality: Left;  . COLONOSCOPY W/ POLYPECTOMY     pt to bring records  . COLONOSCOPY WITH PROPOFOL N/A 06/05/2016   Procedure: COLONOSCOPY WITH PROPOFOL;  Surgeon: Doran Stabler, MD;  Location: [facility];  Service: Gastroenterology;  Laterality: N/A;  . TONSILLECTOMY      Family History  Problem Relation Age of Onset  . Prostate cancer Father   . Alcohol abuse Father   . Emphysema Father        smoked  . Diabetes Sister   . Colon cancer Neg Hx   . Colon polyps Neg Hx   . Stomach cancer Neg Hx   .  Rectal cancer Neg Hx     Social History   Socioeconomic History  . Marital status: Single    Spouse name: Not on file  . Number of children: Not on file  . Years of education: Not on file  . Highest education level: Not on file  Occupational History  . Occupation: dietary services    Employer: [employer]  Social Needs  . Financial resource strain: Not on file  . Food insecurity    Worry: Not on file    Inability: Not on file  . Transportation needs    Medical: Not on file    Non-medical: Not on file  Tobacco Use  . Smoking status: Current Every Day Smoker    Packs/day: 1.00    Years: 30.00    Pack years: 30.00    Types: Cigarettes  . Smokeless tobacco: Never Used  . Tobacco comment: down to .5ppd 05/16/2019  Substance and Sexual Activity  . Alcohol use: No    Comment: " about 40 ounce  beer per month."  down to a beer every 3 weeks  . Drug use: No    Types: Marijuana, Cocaine    Comment: marijuana "laced with something"; today, stated "no" to question of illegal drug use 06/16/2016  . Sexual activity: Not on file  Lifestyle  . Physical activity    Days per week: Not on file    Minutes per session: Not on file  . Stress: Not on file  Relationships  . Social Herbalist on phone: Not on file    Gets together: Not on file    Attends religious service: Not on file    Active member of club or organization: Not on file    Attends meetings of clubs or organizations: Not on file    Relationship status: Not on file  . Intimate partner violence    Fear of current or ex partner: Not on file    Emotionally abused: Not on file    Physically abused: Not on file    Forced sexual activity: Not on file  Other Topics Concern  . Not on file  Social History Narrative  . Not on file    Outpatient Medications Prior to Visit  Medication Sig Dispense Refill  . ACCU-CHEK AVIVA PLUS test strip USE ONE STRIP 5 (FIVE) TIMES DAILY AS INSTRUCTED 400 each 0  . Accu-Chek Softclix Lancets lancets Use as instructed 450 each 0  . atorvastatin (LIPITOR) 80 MG tablet Take 1 tablet (80 mg total) by mouth daily at 6 PM. 90 tablet 0  . AURYXIA 1 GM 210 MG(Fe) tablet TAKE 3 TABLETS BY MOUTH WITH MEALS AND 1 TABLET WITH SNACKS   SWALLOW WHOLE, DO NOT CHEW OR CRUSH MEDICATION    . bacitracin-polymyxin b (POLYSPORIN) ointment Apply topically 2 (two) times daily. 15 g 0  . carvedilol (COREG) 25 MG tablet Take 25 mg by mouth 2 (two) times daily with a meal.    . cetirizine (ZYRTEC) 10 MG tablet TAKE ONE TABLET BY MOUTH ONCE DAILY FOR ALLERGIES 90 tablet 0  . cinacalcet (SENSIPAR) 60 MG tablet Take 60 mg by mouth daily.    . clobetasol cream (TEMOVATE) 0.05 % APPLY TO AFFECED ARES(S) TOPICALLY TWICE DAILY 45 g 0  . Continuous Blood Gluc Receiver (FREESTYLE LIBRE 14 DAY READER) DEVI 1 application  by Does not apply route 3 (three) times daily. Use to check blood glucoses 2 Device 11  . Continuous Blood Gluc  Receiver (FREESTYLE LIBRE 2 READER SYSTM) DEVI 1 Device by Does not apply route as directed. 1 Device 0  . dextrose (GLUTOSE) 40 % GEL Take 37.5 g by mouth once as needed for up to 1 dose for low blood sugar. 37.5 g 1  . fluconazole (DIFLUCAN) 100 MG tablet Take 200 mg by mouth once a week.     . fluticasone (FLONASE) 50 MCG/ACT nasal spray USE TWO SPRAYS INTO EACH NOSTRILS DAILY 16 g 6  . Fluticasone Furoate (ARNUITY ELLIPTA) 50 MCG/ACT AEPB Inhale into the lungs.    . folic acid-vitamin b complex-vitamin c-selenium-zinc (DIALYVITE) 3 MG TABS tablet Take 1 tablet by mouth daily.    . furosemide (LASIX) 80 MG tablet Take 1 tablet (80 mg total) by mouth every Tuesday, Thursday, Saturday, and Sunday. 120 tablet 0  . GAVILAX powder TAKE ONE CAPFUL (17 GRAMS) BY MOUTH 2 (TWO) TIMES DAILY AS NEEDED. (Patient taking differently: Take 17 g by mouth 2 (two) times daily as needed for mild constipation. ) 510 g 1  . heparin 1000 UNIT/ML injection Heparin Sodium (Porcine) 1,000 Units/mL Systemic    . hydrALAZINE (APRESOLINE) 100 MG tablet TAKE 1 TABLET (100 MG TOTAL) BY MOUTH 3 (THREE) TIMES DAILY. 270 tablet 1  . Insulin Glargine (LANTUS SOLOSTAR) 100 UNIT/ML Solostar Pen Inject 45 Units into the skin daily. INJECT 30 UNITS UNDER THE SKIN EVERY MORNING AND INJECT 15 UNITS EVERY NIGHT (Patient taking differently: Inject 45 Units into the skin daily. ) 15 mL 6  . insulin lispro (HUMALOG KWIKPEN) 100 UNIT/ML KwikPen Inject 0.08 mLs (8 Units total) into the skin 3 (three) times daily. Max daily dose of 100 units daily 90 mL 0  . Insulin Pen Needle (B-D UF III MINI PEN NEEDLES) 31G X 5 MM MISC Four times daily 360 each 0  . lidocaine-prilocaine (EMLA) cream APPLY SMALL AMOUNT TO ACCESS SITE (AVF) 1 TO 2 HOURS BEFORE DIALYSIS. COVER WITH OCCLUSIVE DRESSING (SARAN WRAP)    . mupirocin ointment (BACTROBAN) 2  % APPLY THIN LAYER INSIDE EACH NOSTRIL TWICE A DAY FOR  5 DAYS    . NARCAN 4 MG/0.1ML LIQD nasal spray kit Place 2 mLs into the nose See admin instructions. May repeat dose every 2 to 3 minutes until responsive or ems arrives.    . Nicotine 21-14-7 MG/24HR KIT Place 1 patch onto the skin daily. 56 each 0  . Olopatadine HCl 0.2 % SOLN APPLY 1 DROP TO EYE DAILY. 2.5 mL 2  . Oxycodone HCl 10 MG TABS Take 10 mg by mouth every 8 (eight) hours.    . pregabalin (LYRICA) 100 MG capsule TAKE 1 CAPSULE BY MOUTH IN THE MORNING AND 1 CAPSULE IN THE EVENING    . rOPINIRole (REQUIP) 0.25 MG tablet Take 0.25 mg by mouth at bedtime.    . silver sulfADIAZINE (SILVADENE) 1 % cream Apply 1 application topically daily. 50 g 0  . tuberculin (APLISOL) 5 UNIT/0.1ML injection Inject into the skin.    . verapamil (CALAN-SR) 240 MG CR tablet Take 1 tablet (240 mg total) by mouth at bedtime. 90 tablet 3   No facility-administered medications prior to visit.     No Known Allergies  ROS Review of Systems See hpi   Objective:    Physical Exam  BP 122/73 (BP Location: Right Arm, Patient Position: Sitting, Cuff Size: Large)   Pulse 82   Temp 99 F (37.2 C) (Oral)   Wt 278 lb 3.2 oz (126.2 kg)  SpO2 96%   BMI 38.26 kg/m  Wt Readings from Last 3 Encounters:  06/13/19 278 lb 3.2 oz (126.2 kg)  06/05/19 286 lb 9.6 oz (130 kg)  05/16/19 271 lb (122.9 kg)    Physical Exam  Constitutional: Oriented to person, place, and time. Appears well-developed and well-nourished.  HENT:  Head: Normocephalic and atraumatic.  Eyes: Conjunctivae and EOM are normal.  Cardiovascular: Normal rate, regular rhythm, normal heart sounds and intact distal pulses.  No murmur heard. Pulmonary/Chest: Effort normal and breath sounds normal. No stridor. No respiratory distress. Has no wheezes.  Neurological: Is alert and oriented to person, place, and time.  Skin: Skin is warm. Capillary refill takes less than 2 seconds.   Psychiatric: Has a normal mood and affect. Behavior is normal. Judgment and thought content normal.   Health Maintenance Due  Topic Date Due  . LIPID PANEL  05/10/2018  . FOOT EXAM  02/15/2019  . HEMOGLOBIN A1C  03/10/2019  . INFLUENZA VACCINE  04/01/2019    There are no preventive care reminders to display for this patient.  Lab Results  Component Value Date   TSH 2.57 06/16/2016   Lab Results  Component Value Date   WBC 4.3 02/23/2019   HGB 11.2 (A) 03/18/2019   HCT 29.8 (L) 02/23/2019   MCV 95.5 02/23/2019   PLT 110 (L) 02/23/2019   Lab Results  Component Value Date   NA 138 02/23/2019   K 3.9 02/23/2019   CO2 25 02/23/2019   GLUCOSE 243 (H) 02/23/2019   BUN 65 (H) 02/23/2019   CREATININE 8.5 (A) 02/28/2019   BILITOT 0.4 02/20/2019   ALKPHOS 91 02/20/2019   AST 42 (H) 02/20/2019   ALT 41 02/20/2019   PROT 6.7 02/20/2019   ALBUMIN 2.6 (L) 02/23/2019   CALCIUM 8.7 (L) 02/23/2019   ANIONGAP 13 02/23/2019   GFR 46.55 (L) 07/16/2015   Lab Results  Component Value Date   CHOL 175 05/10/2017   Lab Results  Component Value Date   HDL 35 (L) 05/10/2017   Lab Results  Component Value Date   LDLCALC 69 05/10/2017   Lab Results  Component Value Date   TRIG 353 (H) 05/10/2017   Lab Results  Component Value Date   CHOLHDL 5.0 05/10/2017   Lab Results  Component Value Date   HGBA1C 6.4 (A) 12/09/2018      Assessment & Plan:   Problem List Items Addressed This Visit      Other   Chronic pain syndrome    Other Visit Diagnoses    Medically complex patient    -  Primary   ESRD on dialysis ([place])       Type 2 diabetes mellitus with hyperglycemia, with long-term current use of insulin ([place])         This is a patient who has a complex history of multiple medical problems who is here today to discuss his weakness and tremors. Discussed that he has hyperglycemia and his history is concerning for Uremia He has regular appointment with his specialists but  has not been able to get much help from Neurology when they round at Dialysis. Advised him to see Dr. Justin Mend at [facility].  A referral was placed for Neurology and is in process.  The patient should continue his dialysis.  He was advised to cut down on his sugar intake. Reviewed the list of potassium rich foods and phosphorus rich foods and it is very difficult for him to  understand what he can eat.  He also is dealing with coping with all his chronic diseases including diabetes, heart disease, ESRD, chronic pain and DDD   Will try to help patient with coping. Will review his labs from dialysis and perform more education for him to help him with his diet Follow up will be in one month after reviewing his labs.    Meds ordered this encounter  Medications  . ondansetron (ZOFRAN) 4 MG tablet    Sig: Take 1 tablet (4 mg total) by mouth every 8 (eight) hours as needed for nausea or vomiting.    Dispense:  30 tablet    Refill:  0    Follow-up: No follow-ups on file.   A total of 48 minutes were spent face-to-face with the patient during this encounter and over half of that time was spent on counseling and coordination of care.   Forrest Moron, MD

## 2019-06-13 NOTE — Patient Instructions (Addendum)
  Edrick Oh MD Nephrology Phone: [phone] Fax: [phone]      Please call to get an appointment with Dr. Justin Mend Also please authorize release of your labs for the next two weeks from dialysis.     If you have lab work done today you will be contacted with your lab results within the next 2 weeks.  If you have not heard from [place] then please contact us. The fastest way to get your results is to register for My Chart.   IF you received an x-ray today, you will receive an invoice from [facility]. Please contact [facility] at [phone] with questions or concerns regarding your invoice.   IF you received labwork today, you will receive an invoice from [facility]. Please contact [facility] at [phone] with questions or concerns regarding your invoice.   Our billing staff will not be able to assist you with questions regarding bills from these companies.  You will be contacted with the lab results as soon as they are available. The fastest way to get your results is to activate your My Chart account. Instructions are located on the last page of this paperwork. If you have not heard from [place] regarding the results in 2 weeks, please contact this office.

## 2019-06-14 ENCOUNTER — Telehealth: Payer: Self-pay | Admitting: Family Medicine

## 2019-06-14 DIAGNOSIS — Z992 Dependence on renal dialysis: Secondary | ICD-10-CM | POA: Diagnosis not present

## 2019-06-14 DIAGNOSIS — E875 Hyperkalemia: Secondary | ICD-10-CM | POA: Diagnosis not present

## 2019-06-14 DIAGNOSIS — E8779 Other fluid overload: Secondary | ICD-10-CM | POA: Diagnosis not present

## 2019-06-14 DIAGNOSIS — D689 Coagulation defect, unspecified: Secondary | ICD-10-CM | POA: Diagnosis not present

## 2019-06-14 DIAGNOSIS — M545 Low back pain: Secondary | ICD-10-CM | POA: Diagnosis not present

## 2019-06-14 DIAGNOSIS — N186 End stage renal disease: Secondary | ICD-10-CM | POA: Diagnosis not present

## 2019-06-14 DIAGNOSIS — E1122 Type 2 diabetes mellitus with diabetic chronic kidney disease: Secondary | ICD-10-CM | POA: Diagnosis not present

## 2019-06-14 DIAGNOSIS — Z79899 Other long term (current) drug therapy: Secondary | ICD-10-CM | POA: Diagnosis not present

## 2019-06-14 DIAGNOSIS — N2581 Secondary hyperparathyroidism of renal origin: Secondary | ICD-10-CM | POA: Diagnosis not present

## 2019-06-14 NOTE — Telephone Encounter (Signed)
Pprwrk for home health care came in via fax. Electronic copy is in MD Doc Request folder. Hard copy in providers box at nurses statioin

## 2019-06-15 ENCOUNTER — Other Ambulatory Visit: Payer: Self-pay

## 2019-06-15 ENCOUNTER — Ambulatory Visit (INDEPENDENT_AMBULATORY_CARE_PROVIDER_SITE_OTHER): Payer: Medicare Other | Admitting: Internal Medicine

## 2019-06-15 ENCOUNTER — Encounter: Payer: Self-pay | Admitting: Internal Medicine

## 2019-06-15 ENCOUNTER — Ambulatory Visit: Payer: Medicare Other | Admitting: Internal Medicine

## 2019-06-15 VITALS — BP 124/78 | HR 84 | Temp 99.1°F | Ht 72.0 in | Wt 274.8 lb

## 2019-06-15 DIAGNOSIS — E1165 Type 2 diabetes mellitus with hyperglycemia: Secondary | ICD-10-CM

## 2019-06-15 DIAGNOSIS — N186 End stage renal disease: Secondary | ICD-10-CM

## 2019-06-15 DIAGNOSIS — IMO0002 Reserved for concepts with insufficient information to code with codable children: Secondary | ICD-10-CM

## 2019-06-15 DIAGNOSIS — E1142 Type 2 diabetes mellitus with diabetic polyneuropathy: Secondary | ICD-10-CM

## 2019-06-15 DIAGNOSIS — E1122 Type 2 diabetes mellitus with diabetic chronic kidney disease: Secondary | ICD-10-CM | POA: Insufficient documentation

## 2019-06-15 DIAGNOSIS — E113593 Type 2 diabetes mellitus with proliferative diabetic retinopathy without macular edema, bilateral: Secondary | ICD-10-CM

## 2019-06-15 DIAGNOSIS — Z794 Long term (current) use of insulin: Secondary | ICD-10-CM | POA: Diagnosis not present

## 2019-06-15 LAB — POCT GLYCOSYLATED HEMOGLOBIN (HGB A1C): Hemoglobin A1C: 7.4 % — AB (ref 4.0–5.6)

## 2019-06-15 MED ORDER — DEXCOM G6 TRANSMITTER MISC: 1.0000 | 3 refills | Status: DC

## 2019-06-15 MED ORDER — LANTUS SOLOSTAR 100 UNIT/ML ~~LOC~~ SOPN: 40.0000 [IU] | PEN_INJECTOR | Freq: Every day | SUBCUTANEOUS | 3 refills | Status: AC

## 2019-06-15 MED ORDER — DEXCOM G6 RECEIVER DEVI: 1.0000 | 0 refills | Status: DC

## 2019-06-15 MED ORDER — DEXCOM G6 SENSOR MISC: 1.0000 | 6 refills | Status: DC

## 2019-06-15 NOTE — Progress Notes (Signed)
Name: Jerry Cantrell  Age/ Sex: 53 y.o., male   MRN/ DOB: [id number], [birth year]     PCP: Forrest Moron, MD   Reason for Endocrinology Evaluation: Type 2 Diabetes Mellitus  Initial Endocrine Consultative Visit: 08/22/2018    PATIENT IDENTIFIER: Jerry Cantrell is a 53 y.o. male with a past medical history of HTN, T2DM, dyslipidemia and ESRD ( On HD ), OSA on CPAP . The patient has followed with Endocrinology clinic since 08/22/2018 for consultative assistance with management of his diabetes.  DIABETIC HISTORY:  Jerry Cantrell was diagnosed with T2DM ~ in 2004, he does not recall oral glycemic agents that he has tried in the past.He has been on insulin therapy for years. His hemoglobin A1c has ranged from 7.0 % in 01/2018 peaking at 9.1%in 2018  Started HD in 01/2019 (M- W-F)  SUBJECTIVE:   During the last visit (08/22/18): His A1c was 5.9% due to recurrent hypoglycemia, will adjust his Lantus to 30 units once a day and started a standing dose of Humalog between 5 to 10 units with each meal, rather than a sliding scale.  Today (06/15/2019): Jerry Cantrell is a here for a follow-up on his diabetes management. He has not been here since February, 2020.He is currently on HD.  He checks his blood sugars 4 times daily. The patient has had hypoglycemic episodes since the last clinic visit, which typically occur  3-4x/week - most often occurring during the day. The patient is symptomatic with these episodes. Otherwise, the patient has not required any recent emergency interventions for hypoglycemia and has had hospitalizations in 01/2019 for volume overload.   Has been eating a lot of fruits and continues with dietary indiscretions.  Eats mainly 2 meals a day but snacks the rest of the day, he continues to eat sometimes without prandial insulin and tries to correct later.    ROS: As per HPI and as detailed below: Review of Systems  Constitutional: Positive for malaise/fatigue.  Respiratory: Negative  for cough and shortness of breath.   Cardiovascular: Negative for chest pain and palpitations.  Gastrointestinal: Positive for nausea.      HOME DIABETES REGIMEN:  Lantus 45 units daily  Humalog 8 units with meals- has been  10 units  Humalog 2 units with snacks- takes 6-8 units with snacks      METER DOWNLOAD SUMMARY: Date range evaluated: 9/16- 06/15/2019 Fingerstick Blood Glucose Tests = 155 Average Number Tests/Day = 5.2 Overall Mean FS Glucose = 193 Standard Deviation = 94.5  BG Ranges: Low = 41 High = 516   Hypoglycemic Events/30 Days: BG < 50 = 11 Episodes of symptomatic severe hypoglycemia = no     DIABETIC COMPLICATIONS: Microvascular complications:   CKD, neuropathy , B/L retinopathy, S/P injections   Last eye exam: Completed 07/2018   Macrovascular complications:    Denies: CAD, PVD, CVA   HISTORY:  Past Medical History:  Past Medical History:  Diagnosis Date  . Acute diastolic heart failure ([place])   . CHF (congestive heart failure) ([place])   . Diabetes mellitus with nephropathy ([place]) 05/12/2012   Dr. Justin Mend follows  . Hemorrhoids 05/12/2012  . Hyperlipidemia   . Hypertension   . Lumbar spinal stenosis 05/03/2012   Mild with only right L4 nerve root encroachment, no neurogenic claudication   . Lumbar spondylosis 05/03/2012  . Meralgia paraesthetica 05/03/2012  . Meralgia paraesthetica 05/03/2012   On Lyrica which does improve pain.   Marland Kitchen Neuropathy in diabetes ([place]) 05/12/2012  .  Obesity   . Pneumonia   . Retinopathy   . Sleep apnea    uses cpap  . Substance abuse ([place])   . Urinary incontinence 05/12/2012   Since the start of Sept. 2013    Past Surgical History:  Past Surgical History:  Procedure Laterality Date  . ABDOMINAL SURGERY     Abscess I&D 2/2 infected hair  . AV FISTULA PLACEMENT Left 02/06/2016   Procedure: LEFT ARM RADIOCEPHALIC ARTERIOVENOUS (AV) FISTULA CREATION;  Surgeon: Rosetta Posner, MD;  Location: [facility];  Service: Vascular;   Laterality: Left;  . COLONOSCOPY W/ POLYPECTOMY     pt to bring records  . COLONOSCOPY WITH PROPOFOL N/A 06/05/2016   Procedure: COLONOSCOPY WITH PROPOFOL;  Surgeon: Doran Stabler, MD;  Location: [facility];  Service: Gastroenterology;  Laterality: N/A;  . TONSILLECTOMY      Social History:  reports that he has been smoking cigarettes. He has a 30.00 pack-year smoking history. He has never used smokeless tobacco. He reports that he does not drink alcohol or use drugs. Family History:  Family History  Problem Relation Age of Onset  . Prostate cancer Father   . Alcohol abuse Father   . Emphysema Father        smoked  . Diabetes Sister   . Colon cancer Neg Hx   . Colon polyps Neg Hx   . Stomach cancer Neg Hx   . Rectal cancer Neg Hx      HOME MEDICATIONS: Allergies as of 06/15/2019   No Known Allergies     Medication List       Accurate as of June 15, 2019 11:54 AM. If you have any questions, ask your nurse or doctor.        Accu-Chek Aviva Plus test strip Generic drug: glucose blood USE ONE STRIP 5 (FIVE) TIMES DAILY AS INSTRUCTED   Accu-Chek Softclix Lancets lancets Use as instructed   Aplisol 5 UNIT/0.1ML injection Generic drug: tuberculin Inject into the skin.   Arnuity Ellipta 50 MCG/ACT Aepb Generic drug: Fluticasone Furoate Inhale into the lungs.   atorvastatin 80 MG tablet Commonly known as: LIPITOR Take 1 tablet (80 mg total) by mouth daily at 6 PM.   Auryxia 1 GM 210 MG(Fe) tablet Generic drug: ferric citrate TAKE 3 TABLETS BY MOUTH WITH MEALS AND 1 TABLET WITH SNACKS   SWALLOW WHOLE, DO NOT CHEW OR CRUSH MEDICATION   bacitracin-polymyxin b ointment Commonly known as: POLYSPORIN Apply topically 2 (two) times daily.   carvedilol 25 MG tablet Commonly known as: COREG Take 25 mg by mouth 2 (two) times daily with a meal.   cetirizine 10 MG tablet Commonly known as: ZYRTEC TAKE ONE TABLET BY MOUTH ONCE DAILY FOR ALLERGIES   cinacalcet  60 MG tablet Commonly known as: SENSIPAR Take 60 mg by mouth daily.   clobetasol cream 0.05 % Commonly known as: TEMOVATE APPLY TO AFFECED ARES(S) TOPICALLY TWICE DAILY   Dexcom G6 Receiver Devi 1 Device by Does not apply route as directed. What changed: Another medication with the same name was removed. Continue taking this medication, and follow the directions you see here. Changed by: Dorita Sciara, MD   Dexcom G6 Sensor Misc 1 Device by Does not apply route as directed. Started by: Dorita Sciara, MD   Dexcom G6 Transmitter Misc 1 Device by Does not apply route as directed. Started by: Dorita Sciara, MD   dextrose 40 % Gel Commonly known as: GLUTOSE Take  37.5 g by mouth once as needed for up to 1 dose for low blood sugar.   fluconazole 100 MG tablet Commonly known as: DIFLUCAN Take 200 mg by mouth once a week.   fluticasone 50 MCG/ACT nasal spray Commonly known as: FLONASE USE TWO SPRAYS INTO EACH NOSTRILS DAILY   folic acid-vitamin b complex-vitamin c-selenium-zinc 3 MG Tabs tablet Take 1 tablet by mouth daily.   furosemide 80 MG tablet Commonly known as: LASIX Take 1 tablet (80 mg total) by mouth every Tuesday, Thursday, Saturday, and Sunday.   GaviLAX 17 GM/SCOOP powder Generic drug: polyethylene glycol powder TAKE ONE CAPFUL (17 GRAMS) BY MOUTH 2 (TWO) TIMES DAILY AS NEEDED. What changed: See the new instructions.   heparin 1000 UNIT/ML injection Heparin Sodium (Porcine) 1,000 Units/mL Systemic   hydrALAZINE 100 MG tablet Commonly known as: APRESOLINE TAKE 1 TABLET (100 MG TOTAL) BY MOUTH 3 (THREE) TIMES DAILY.   Insulin Glargine 100 UNIT/ML Solostar Pen Commonly known as: Lantus SoloStar Inject 45 Units into the skin daily. INJECT 30 UNITS UNDER THE SKIN EVERY MORNING AND INJECT 15 UNITS EVERY NIGHT What changed: additional instructions   insulin lispro 100 UNIT/ML KwikPen Commonly known as: HumaLOG KwikPen Inject 0.08 mLs (8  Units total) into the skin 3 (three) times daily. Max daily dose of 100 units daily What changed: how much to take   Insulin Pen Needle 31G X 5 MM Misc Commonly known as: B-D UF III MINI PEN NEEDLES Four times daily   lidocaine-prilocaine cream Commonly known as: EMLA APPLY SMALL AMOUNT TO ACCESS SITE (AVF) 1 TO 2 HOURS BEFORE DIALYSIS. COVER WITH OCCLUSIVE DRESSING (SARAN WRAP)   mupirocin ointment 2 % Commonly known as: BACTROBAN APPLY THIN LAYER INSIDE EACH NOSTRIL TWICE A DAY FOR  5 DAYS   Narcan 4 MG/0.1ML Liqd nasal spray kit Generic drug: naloxone Place 2 mLs into the nose See admin instructions. May repeat dose every 2 to 3 minutes until responsive or ems arrives.   Nicotine 21-14-7 MG/24HR Kit Place 1 patch onto the skin daily.   Olopatadine HCl 0.2 % Soln APPLY 1 DROP TO EYE DAILY.   ondansetron 4 MG tablet Commonly known as: Zofran Take 1 tablet (4 mg total) by mouth every 8 (eight) hours as needed for nausea or vomiting.   Oxycodone HCl 10 MG Tabs Take 10 mg by mouth every 8 (eight) hours.   pregabalin 100 MG capsule Commonly known as: LYRICA TAKE 1 CAPSULE BY MOUTH IN THE MORNING AND 1 CAPSULE IN THE EVENING   rOPINIRole 0.25 MG tablet Commonly known as: REQUIP Take 0.25 mg by mouth at bedtime.   silver sulfADIAZINE 1 % cream Commonly known as: Silvadene Apply 1 application topically daily.   verapamil 240 MG CR tablet Commonly known as: CALAN-SR Take 1 tablet (240 mg total) by mouth at bedtime.        OBJECTIVE:   Vital Signs: BP 124/78 (BP Location: Right Arm, Patient Position: Sitting, Cuff Size: Normal)   Pulse 84   Temp 99.1 F (37.3 C)   Ht 6' (1.829 m)   Wt 274 lb 12.8 oz (124.6 kg)   SpO2 99%   BMI 37.27 kg/m   Wt Readings from Last 3 Encounters:  06/15/19 274 lb 12.8 oz (124.6 kg)  06/13/19 278 lb 3.2 oz (126.2 kg)  06/05/19 286 lb 9.6 oz (130 kg)     Exam: General: Pt appears well and is in NAD  Lungs: Clear with good BS  bilat  with no rales, rhonchi, or wheezes  Heart: RRR with normal S1 and S2 and no gallops; no murmurs; no rub  Extremities: No pretibial edema. No tremor.   Neuro: MS is good with appropriate affect, pt is alert and Ox3     DATA REVIEWED:  Lab Results  Component Value Date   HGBA1C 7.4 (A) 06/15/2019   HGBA1C 6.4 (A) 12/09/2018   HGBA1C 5.9 (A) 08/18/2018   Lab Results  Component Value Date   MICROALBUR 72.5 06/16/2016   LDLCALC 69 05/10/2017   CREATININE 8.5 (A) 02/28/2019   Lab Results  Component Value Date   MICRALBCREAT 1,926.1 (H) 06/16/2013    Lab Results  Component Value Date   CHOL 175 05/10/2017   HDL 35 (L) 05/10/2017   LDLCALC 69 05/10/2017   LDLDIRECT 99 09/16/2010   TRIG 353 (H) 05/10/2017   CHOLHDL 5.0 05/10/2017         ASSESSMENT / PLAN / RECOMMENDATIONS:   1) Type 2 Diabetes Mellitus,Acceptable  control, With Retinopathy, ESRD , and neuropathic complications - Most recent A1c of 7.4 %. Goal A1c < 7.5 %.    - Pt continues with dietary indiscretions, he continues to skip on taking  prandial insulin at times which causes seevre hyperglycemia, pt tries to correct and end up with hypoglycemia. An example , had a BG of 162 mg/dL he ate a sweet potatoe pie and milk but didn't take Humalog, post-prandial BG was > 300 mg/dL. We used that as an education example of the importance of not taking prandial insulin.  -  He was noted with hypoglycemia in the morning, will reduce lantus as below - His during the day hypoglycemic episodes is due to insulin-CHO mismatch. I have offered him a referralto our CDE to discuss carb counting to help with this, but at this time he is interested with CGM.  - A prescription for a CGM will be faxed to a medical supply place.   - I discussed with him again that insulin and hypoglycemia could cause death. He was strongly discouraged from guessing or adjusting his insulin on his own.   MEDICATIONS:  Decrease Lantus 40 units QAM    Humalog 10 units with meals    For a snack take 5 units of Humalog   EDUCATION / INSTRUCTIONS:  BG monitoring instructions: Patient is instructed to check his blood sugars 4 times a day, fasting and bedtime.  Call [facility] if: BG persistently < 70 or > 300. . I reviewed the Rule of 15 for the treatment of hypoglycemia in detail with the patient. Literature supplied.     F/U in 3 months    Signed electronically by: Mack Guise, MD  [facility]  [address] [place], [place] [address] Phone: [phone] FAX: [phone]   CC: Forrest Moron, MD [place] [address] Phone: [phone]  Fax: [phone]  Return to Endocrinology clinic as below: Future Appointments  Date Time Provider [place]  07/10/2019  9:00 AM Magdalen Spatz, NP [facility] None  07/11/2019 11:00 AM Forrest Moron, MD [facility]  07/21/2019  4:00 PM Fay Records, MD [facility]  09/12/2019  9:45 AM Marzetta Board, DPM [facility]  09/19/2019 10:10 AM , Melanie Crazier, MD [facility] None

## 2019-06-15 NOTE — Patient Instructions (Addendum)
Decrease Lantus 40 units daily   Humalog 10 units with Meals  Humalog 5 units with each snack       HOW TO TREAT LOW BLOOD SUGARS (Blood sugar LESS THAN 70 MG/DL)  Please follow the RULE OF 15 for the treatment of hypoglycemia treatment (when your (blood sugars are less than 70 mg/dL)    STEP 1: Take 15 grams of carbohydrates when your blood sugar is low, which includes:   3-4 GLUCOSE TABS  OR  3-4 OZ OF JUICE OR REGULAR SODA OR  ONE TUBE OF GLUCOSE GEL     STEP 2: RECHECK blood sugar in 15 MINUTES STEP 3: If your blood sugar is still low at the 15 minute recheck --> then, go back to STEP 1 and treat AGAIN with another 15 grams of carbohydrates.

## 2019-06-16 DIAGNOSIS — D689 Coagulation defect, unspecified: Secondary | ICD-10-CM | POA: Diagnosis not present

## 2019-06-16 DIAGNOSIS — N2581 Secondary hyperparathyroidism of renal origin: Secondary | ICD-10-CM | POA: Diagnosis not present

## 2019-06-16 DIAGNOSIS — E1122 Type 2 diabetes mellitus with diabetic chronic kidney disease: Secondary | ICD-10-CM | POA: Diagnosis not present

## 2019-06-16 DIAGNOSIS — E8779 Other fluid overload: Secondary | ICD-10-CM | POA: Diagnosis not present

## 2019-06-16 DIAGNOSIS — M545 Low back pain: Secondary | ICD-10-CM | POA: Diagnosis not present

## 2019-06-16 DIAGNOSIS — Z79899 Other long term (current) drug therapy: Secondary | ICD-10-CM | POA: Diagnosis not present

## 2019-06-16 DIAGNOSIS — Z992 Dependence on renal dialysis: Secondary | ICD-10-CM | POA: Diagnosis not present

## 2019-06-16 DIAGNOSIS — N186 End stage renal disease: Secondary | ICD-10-CM | POA: Diagnosis not present

## 2019-06-16 DIAGNOSIS — E875 Hyperkalemia: Secondary | ICD-10-CM | POA: Diagnosis not present

## 2019-06-17 DIAGNOSIS — N2581 Secondary hyperparathyroidism of renal origin: Secondary | ICD-10-CM | POA: Diagnosis not present

## 2019-06-17 DIAGNOSIS — N186 End stage renal disease: Secondary | ICD-10-CM | POA: Diagnosis not present

## 2019-06-17 DIAGNOSIS — E8779 Other fluid overload: Secondary | ICD-10-CM | POA: Diagnosis not present

## 2019-06-17 DIAGNOSIS — D689 Coagulation defect, unspecified: Secondary | ICD-10-CM | POA: Diagnosis not present

## 2019-06-17 DIAGNOSIS — Z992 Dependence on renal dialysis: Secondary | ICD-10-CM | POA: Diagnosis not present

## 2019-06-17 DIAGNOSIS — Z79899 Other long term (current) drug therapy: Secondary | ICD-10-CM | POA: Diagnosis not present

## 2019-06-17 DIAGNOSIS — E1122 Type 2 diabetes mellitus with diabetic chronic kidney disease: Secondary | ICD-10-CM | POA: Diagnosis not present

## 2019-06-17 DIAGNOSIS — E875 Hyperkalemia: Secondary | ICD-10-CM | POA: Diagnosis not present

## 2019-06-17 DIAGNOSIS — M545 Low back pain: Secondary | ICD-10-CM | POA: Diagnosis not present

## 2019-06-19 DIAGNOSIS — E875 Hyperkalemia: Secondary | ICD-10-CM | POA: Diagnosis not present

## 2019-06-19 DIAGNOSIS — M545 Low back pain: Secondary | ICD-10-CM | POA: Diagnosis not present

## 2019-06-19 DIAGNOSIS — D689 Coagulation defect, unspecified: Secondary | ICD-10-CM | POA: Diagnosis not present

## 2019-06-19 DIAGNOSIS — E1122 Type 2 diabetes mellitus with diabetic chronic kidney disease: Secondary | ICD-10-CM | POA: Diagnosis not present

## 2019-06-19 DIAGNOSIS — N186 End stage renal disease: Secondary | ICD-10-CM | POA: Diagnosis not present

## 2019-06-19 DIAGNOSIS — Z992 Dependence on renal dialysis: Secondary | ICD-10-CM | POA: Diagnosis not present

## 2019-06-19 DIAGNOSIS — N2581 Secondary hyperparathyroidism of renal origin: Secondary | ICD-10-CM | POA: Diagnosis not present

## 2019-06-19 DIAGNOSIS — E113512 Type 2 diabetes mellitus with proliferative diabetic retinopathy with macular edema, left eye: Secondary | ICD-10-CM | POA: Diagnosis not present

## 2019-06-19 DIAGNOSIS — E8779 Other fluid overload: Secondary | ICD-10-CM | POA: Diagnosis not present

## 2019-06-19 DIAGNOSIS — Z79899 Other long term (current) drug therapy: Secondary | ICD-10-CM | POA: Diagnosis not present

## 2019-06-20 ENCOUNTER — Ambulatory Visit: Payer: Medicare Other | Admitting: Family Medicine

## 2019-06-20 ENCOUNTER — Other Ambulatory Visit: Payer: Self-pay | Admitting: Family Medicine

## 2019-06-20 DIAGNOSIS — I10 Essential (primary) hypertension: Secondary | ICD-10-CM

## 2019-06-21 ENCOUNTER — Emergency Department (HOSPITAL_COMMUNITY): Payer: Medicare Other

## 2019-06-21 ENCOUNTER — Ambulatory Visit (INDEPENDENT_AMBULATORY_CARE_PROVIDER_SITE_OTHER): Payer: Medicare Other | Admitting: Neurology

## 2019-06-21 ENCOUNTER — Telehealth: Payer: Self-pay | Admitting: Family Medicine

## 2019-06-21 ENCOUNTER — Encounter (HOSPITAL_COMMUNITY): Payer: Self-pay

## 2019-06-21 ENCOUNTER — Inpatient Hospital Stay (HOSPITAL_COMMUNITY)
Admission: EM | Admit: 2019-06-21 | Discharge: 2019-06-24 | DRG: 640 | Disposition: A | Discharge status: Home / Self Care | Payer: Medicare Other | Attending: Family Medicine | Admitting: Family Medicine

## 2019-06-21 ENCOUNTER — Telehealth: Payer: Self-pay | Admitting: Internal Medicine

## 2019-06-21 ENCOUNTER — Other Ambulatory Visit: Payer: Self-pay

## 2019-06-21 ENCOUNTER — Encounter: Payer: Self-pay | Admitting: Neurology

## 2019-06-21 ENCOUNTER — Telehealth: Payer: Self-pay

## 2019-06-21 VITALS — BP 114/77 | HR 75 | Temp 97.6°F

## 2019-06-21 DIAGNOSIS — E785 Hyperlipidemia, unspecified: Secondary | ICD-10-CM | POA: Diagnosis present

## 2019-06-21 DIAGNOSIS — Z992 Dependence on renal dialysis: Secondary | ICD-10-CM | POA: Diagnosis not present

## 2019-06-21 DIAGNOSIS — M545 Low back pain, unspecified: Secondary | ICD-10-CM | POA: Insufficient documentation

## 2019-06-21 DIAGNOSIS — I440 Atrioventricular block, first degree: Secondary | ICD-10-CM | POA: Diagnosis not present

## 2019-06-21 DIAGNOSIS — Z79891 Long term (current) use of opiate analgesic: Secondary | ICD-10-CM

## 2019-06-21 DIAGNOSIS — N186 End stage renal disease: Secondary | ICD-10-CM

## 2019-06-21 DIAGNOSIS — N184 Chronic kidney disease, stage 4 (severe): Secondary | ICD-10-CM | POA: Diagnosis present

## 2019-06-21 DIAGNOSIS — M47816 Spondylosis without myelopathy or radiculopathy, lumbar region: Secondary | ICD-10-CM | POA: Diagnosis not present

## 2019-06-21 DIAGNOSIS — E8779 Other fluid overload: Secondary | ICD-10-CM | POA: Diagnosis not present

## 2019-06-21 DIAGNOSIS — E162 Hypoglycemia, unspecified: Secondary | ICD-10-CM | POA: Diagnosis not present

## 2019-06-21 DIAGNOSIS — Z794 Long term (current) use of insulin: Secondary | ICD-10-CM | POA: Diagnosis not present

## 2019-06-21 DIAGNOSIS — Z8042 Family history of malignant neoplasm of prostate: Secondary | ICD-10-CM

## 2019-06-21 DIAGNOSIS — I132 Hypertensive heart and chronic kidney disease with heart failure and with stage 5 chronic kidney disease, or end stage renal disease: Secondary | ICD-10-CM | POA: Diagnosis present

## 2019-06-21 DIAGNOSIS — G253 Myoclonus: Secondary | ICD-10-CM | POA: Diagnosis not present

## 2019-06-21 DIAGNOSIS — G3281 Cerebellar ataxia in diseases classified elsewhere: Secondary | ICD-10-CM | POA: Diagnosis not present

## 2019-06-21 DIAGNOSIS — E1169 Type 2 diabetes mellitus with other specified complication: Secondary | ICD-10-CM

## 2019-06-21 DIAGNOSIS — Z6838 Body mass index (BMI) 38.0-38.9, adult: Secondary | ICD-10-CM | POA: Diagnosis not present

## 2019-06-21 DIAGNOSIS — G4733 Obstructive sleep apnea (adult) (pediatric): Secondary | ICD-10-CM

## 2019-06-21 DIAGNOSIS — R269 Unspecified abnormalities of gait and mobility: Secondary | ICD-10-CM

## 2019-06-21 DIAGNOSIS — G8929 Other chronic pain: Secondary | ICD-10-CM | POA: Diagnosis present

## 2019-06-21 DIAGNOSIS — E1140 Type 2 diabetes mellitus with diabetic neuropathy, unspecified: Secondary | ICD-10-CM | POA: Diagnosis present

## 2019-06-21 DIAGNOSIS — G5710 Meralgia paresthetica, unspecified lower limb: Secondary | ICD-10-CM | POA: Diagnosis present

## 2019-06-21 DIAGNOSIS — Z20828 Contact with and (suspected) exposure to other viral communicable diseases: Secondary | ICD-10-CM | POA: Diagnosis present

## 2019-06-21 DIAGNOSIS — E1122 Type 2 diabetes mellitus with diabetic chronic kidney disease: Secondary | ICD-10-CM | POA: Diagnosis present

## 2019-06-21 DIAGNOSIS — R9431 Abnormal electrocardiogram [ECG] [EKG]: Secondary | ICD-10-CM | POA: Diagnosis not present

## 2019-06-21 DIAGNOSIS — I10 Essential (primary) hypertension: Secondary | ICD-10-CM

## 2019-06-21 DIAGNOSIS — E875 Hyperkalemia: Principal | ICD-10-CM | POA: Diagnosis present

## 2019-06-21 DIAGNOSIS — I5032 Chronic diastolic (congestive) heart failure: Secondary | ICD-10-CM | POA: Diagnosis not present

## 2019-06-21 DIAGNOSIS — E8770 Fluid overload, unspecified: Secondary | ICD-10-CM | POA: Diagnosis present

## 2019-06-21 DIAGNOSIS — R251 Tremor, unspecified: Secondary | ICD-10-CM

## 2019-06-21 DIAGNOSIS — I120 Hypertensive chronic kidney disease with stage 5 chronic kidney disease or end stage renal disease: Secondary | ICD-10-CM | POA: Diagnosis not present

## 2019-06-21 DIAGNOSIS — Z825 Family history of asthma and other chronic lower respiratory diseases: Secondary | ICD-10-CM

## 2019-06-21 DIAGNOSIS — IMO0002 Reserved for concepts with insufficient information to code with codable children: Secondary | ICD-10-CM

## 2019-06-21 DIAGNOSIS — I493 Ventricular premature depolarization: Secondary | ICD-10-CM | POA: Diagnosis present

## 2019-06-21 DIAGNOSIS — I4510 Unspecified right bundle-branch block: Secondary | ICD-10-CM | POA: Diagnosis present

## 2019-06-21 DIAGNOSIS — E113599 Type 2 diabetes mellitus with proliferative diabetic retinopathy without macular edema, unspecified eye: Secondary | ICD-10-CM | POA: Diagnosis not present

## 2019-06-21 DIAGNOSIS — Z79899 Other long term (current) drug therapy: Secondary | ICD-10-CM

## 2019-06-21 DIAGNOSIS — Z833 Family history of diabetes mellitus: Secondary | ICD-10-CM | POA: Diagnosis not present

## 2019-06-21 DIAGNOSIS — E669 Obesity, unspecified: Secondary | ICD-10-CM | POA: Diagnosis present

## 2019-06-21 DIAGNOSIS — R0602 Shortness of breath: Secondary | ICD-10-CM | POA: Diagnosis not present

## 2019-06-21 DIAGNOSIS — R531 Weakness: Secondary | ICD-10-CM

## 2019-06-21 DIAGNOSIS — E161 Other hypoglycemia: Secondary | ICD-10-CM | POA: Diagnosis not present

## 2019-06-21 DIAGNOSIS — E113593 Type 2 diabetes mellitus with proliferative diabetic retinopathy without macular edema, bilateral: Secondary | ICD-10-CM

## 2019-06-21 DIAGNOSIS — E1165 Type 2 diabetes mellitus with hyperglycemia: Secondary | ICD-10-CM

## 2019-06-21 DIAGNOSIS — M549 Dorsalgia, unspecified: Secondary | ICD-10-CM | POA: Diagnosis present

## 2019-06-21 LAB — CBC
HCT: 44 % (ref 39.0–52.0)
Hemoglobin: 13.7 g/dL (ref 13.0–17.0)
MCH: 30.3 pg (ref 26.0–34.0)
MCHC: 31.1 g/dL (ref 30.0–36.0)
MCV: 97.3 fL (ref 80.0–100.0)
Platelets: 145 10*3/uL — ABNORMAL LOW (ref 150–400)
RBC: 4.52 MIL/uL (ref 4.22–5.81)
RDW: 14.3 % (ref 11.5–15.5)
WBC: 10.3 10*3/uL (ref 4.0–10.5)
nRBC: 0 % (ref 0.0–0.2)

## 2019-06-21 LAB — BASIC METABOLIC PANEL
Anion gap: 16 — ABNORMAL HIGH (ref 5–15)
BUN: 84 mg/dL — ABNORMAL HIGH (ref 6–20)
CO2: 30 mmol/L (ref 22–32)
Calcium: 10.2 mg/dL (ref 8.9–10.3)
Chloride: 89 mmol/L — ABNORMAL LOW (ref 98–111)
Creatinine, Ser: 12.9 mg/dL — ABNORMAL HIGH (ref 0.61–1.24)
GFR calc Af Amer: 5 mL/min — ABNORMAL LOW (ref >60)
GFR calc non Af Amer: 4 mL/min — ABNORMAL LOW (ref >60)
Glucose, Bld: 73 mg/dL (ref 70–99)
Potassium: >7.5 mmol/L (ref 3.5–5.1)
Sodium: 135 mmol/L (ref 135–145)

## 2019-06-21 LAB — SARS CORONAVIRUS 2 BY RT PCR (HOSPITAL ORDER, PERFORMED IN ~~LOC~~ HOSPITAL LAB): SARS Coronavirus 2: NEGATIVE

## 2019-06-21 MED ORDER — HEPARIN SODIUM (PORCINE) 5000 UNIT/ML IJ SOLN
5000.0000 [IU] | Freq: Three times a day (TID) | INTRAMUSCULAR | Status: DC
  Administered 2019-06-22 – 2019-06-24 (×4): 5000 [IU] via SUBCUTANEOUS
  Filled 2019-06-21 (×5): qty 1

## 2019-06-21 MED ORDER — DEXCOM G6 TRANSMITTER MISC: 1.0000 | 3 refills | Status: DC

## 2019-06-21 MED ORDER — DEXCOM G6 SENSOR MISC: 1.0000 | 6 refills | Status: DC

## 2019-06-21 MED ORDER — SODIUM POLYSTYRENE SULFONATE 15 GM/60ML PO SUSP
60.0000 g | Freq: Once | ORAL | Status: DC
  Filled 2019-06-21: qty 240

## 2019-06-21 MED ORDER — CALCIUM GLUCONATE-NACL 1-0.675 GM/50ML-% IV SOLN
1.0000 g | Freq: Once | INTRAVENOUS | Status: AC
  Administered 2019-06-21: 1000 mg via INTRAVENOUS
  Filled 2019-06-21: qty 50

## 2019-06-21 MED ORDER — SODIUM CHLORIDE 0.9% FLUSH: 3.0000 mL | Freq: Once | INTRAVENOUS | Status: DC

## 2019-06-21 MED ORDER — SODIUM CHLORIDE 0.9 % IV SOLN: 1.0000 g | Freq: Once | INTRAVENOUS | Status: DC

## 2019-06-21 MED ORDER — SODIUM CHLORIDE 0.9 % IV SOLN
1.0000 g | Freq: Once | INTRAVENOUS | Status: AC
  Administered 2019-06-21: 1 g via INTRAVENOUS
  Filled 2019-06-21: qty 10

## 2019-06-21 MED ORDER — INSULIN ASPART 100 UNIT/ML ~~LOC~~ SOLN
5.0000 [IU] | Freq: Once | SUBCUTANEOUS | Status: AC
  Administered 2019-06-21: 5 [IU] via INTRAVENOUS

## 2019-06-21 MED ORDER — DEXTROSE 50 % IV SOLN
1.0000 | Freq: Once | INTRAVENOUS | Status: AC
  Administered 2019-06-21: 50 mL via INTRAVENOUS
  Filled 2019-06-21: qty 50

## 2019-06-21 MED ORDER — CHLORHEXIDINE GLUCONATE CLOTH 2 % EX PADS: 6.0000 | MEDICATED_PAD | Freq: Every day | CUTANEOUS | Status: DC

## 2019-06-21 MED ORDER — DEXCOM G6 RECEIVER DEVI: 1.0000 | 0 refills | Status: AC

## 2019-06-21 MED ORDER — SODIUM ZIRCONIUM CYCLOSILICATE 10 G PO PACK: 10.0000 g | PACK | Freq: Once | ORAL | Status: DC

## 2019-06-21 NOTE — Telephone Encounter (Signed)
[facility] faxed back forms to be filled and faxed out today by [facility]

## 2019-06-21 NOTE — ED Notes (Signed)
Pt witnessed walking outside.

## 2019-06-21 NOTE — H&P (Signed)
History and Physical    Jerry Cantrell ZOX:[id number] DOB: 08/26/1966 DOA: 06/21/2019  PCP: Forrest Moron, MD  Patient coming from: Home  I have personally briefly reviewed patient's old medical records in [place]  Chief Complaint: Shortness of breath  HPI: Jerry Cantrell is a 53 y.o. male with medical history significant of ESRD M/W/F, diastolic CHF, insulin dependent Type 2 diabetes, OSA on Bipap, hypertension who presents with concerns of dyspnea and "nervous system shutting down."  States that for the past month he has had on and off generalized weakness to the point where he could not even hold his head up or ambulate.  Also notes that he would occasionally start shaking  from his shoulders and down to his lower extremity. Denies any loss of consciousness or incontinence with these episodes. These were happening a few times a month but now it is about 2-3 times a day.  Also has shortness of breath especially with exertion and reports he has felt that way ever since he started dialysis a few months ago. He was recently seen by outpatient neurology on 10/21. Had negative CT head. Neurology felt shaking episodes were suggestive of metabolic asterixis and myoclonic jerking.Also suspect polypharmacy with pain medication. MRI brain and EEG were recommended but pending.  Presently, he feels he is having trouble getting deep breaths in although he is at 97% on room air. Denies any chest pain. Notes diffuse abdominal pain worse with palpation. Had an episode of vomiting today. No diarrhea.    ED Course: He is afebrile and normotensive.  CBC showed no leukocytosis or anemia.  Have mild low platelet count of 145.  BMP showed hyperkalemia of 7.5, creatinine of 12.9 which is elevated from his baseline around 8-9.  Anion gap 16.  EKG showed a sinus rhythm with frequent PVCs and peaked T waves compared to prior.  He was given 1 g calcium gluconate, insulin with dextrose in the ED.  Kayexalate was also  ordered but patient refused. ED PA consulted nephrology and patient was recommended to be taken for emergent dialysis.  Review of Systems:  Constitutional: No Weight Change, No Fever ENT/Mouth: No sore throat, No Rhinorrhea Eyes: No Eye Pain, No Vision Changes Cardiovascular: No Chest Pain, +SOB Respiratory: No Cough, No Sputum, No Wheezing, no Dyspnea  Gastrointestinal: No Nausea, + Vomiting, No Diarrhea, No Constipation, No Pain Genitourinary: no Urinary Incontinence, No Urgency, No Flank Pain Musculoskeletal: No Arthralgias, No Myalgias Skin: No Skin Lesions, No Pruritus, Neuro: + Weakness, No Numbness,  No Loss of Consciousness, No Syncope Psych: No Anxiety/Panic, No Depression, no decrease appetite Heme/Lymph: No Bruising, No Bleeding  Past Medical History:  Diagnosis Date  . Acute diastolic heart failure ([place])   . CHF (congestive heart failure) ([place])   . Diabetes mellitus with nephropathy ([place]) 05/12/2012   Dr. Justin Mend follows - dialysis three times weekly  . Hemorrhoids 05/12/2012  . Hyperlipidemia   . Hypertension   . Lumbar spinal stenosis 05/03/2012   Mild with only right L4 nerve root encroachment, no neurogenic claudication   . Lumbar spondylosis 05/03/2012  . Meralgia paraesthetica 05/03/2012  . Meralgia paraesthetica 05/03/2012   On Lyrica which does improve pain.   Marland Kitchen Neuropathy in diabetes ([place]) 05/12/2012  . Obesity   . Pneumonia   . Retinopathy   . Sleep apnea    uses cpap  . Substance abuse ([place])   . Urinary incontinence 05/12/2012   Since the start of Sept. 2013  Past Surgical History:  Procedure Laterality Date  . ABDOMINAL SURGERY     Abscess I&D 2/2 infected hair  . AV FISTULA PLACEMENT Left 02/06/2016   Procedure: LEFT ARM RADIOCEPHALIC ARTERIOVENOUS (AV) FISTULA CREATION;  Surgeon: Rosetta Posner, MD;  Location: [facility];  Service: Vascular;  Laterality: Left;  . COLONOSCOPY W/ POLYPECTOMY     pt to bring records  . COLONOSCOPY WITH PROPOFOL N/A 06/05/2016    Procedure: COLONOSCOPY WITH PROPOFOL;  Surgeon: Doran Stabler, MD;  Location: [facility];  Service: Gastroenterology;  Laterality: N/A;  . TONSILLECTOMY       reports that he has been smoking cigarettes. He has a 15.00 pack-year smoking history. He has never used smokeless tobacco. He reports that he does not drink alcohol or use drugs.  No Known Allergies  Family History  Problem Relation Age of Onset  . Prostate cancer Father   . Alcohol abuse Father   . Emphysema Father        smoked  . Memory loss Mother   . Diabetes Sister   . Colon cancer Neg Hx   . Colon polyps Neg Hx   . Stomach cancer Neg Hx   . Rectal cancer Neg Hx    Family history reviewed and not pertinent  Prior to Admission medications   Medication Sig Start Date End Date Taking? Authorizing Provider  ACCU-CHEK AVIVA PLUS test strip USE ONE STRIP 5 (FIVE) TIMES DAILY AS INSTRUCTED 04/24/19  Yes Delia Chimes A, MD  Accu-Chek Softclix Lancets lancets Use as instructed 11/17/18  Yes Stallings, Zoe A, MD  atorvastatin (LIPITOR) 80 MG tablet Take 1 tablet (80 mg total) by mouth daily at 6 PM. 11/17/18  Yes Stallings, Zoe A, MD  AURYXIA 1 GM 210 MG(Fe) tablet Take 210 mg by mouth 3 (three) times daily with meals.  05/15/19  Yes [provider]  bacitracin-polymyxin b (POLYSPORIN) ointment Apply topically 2 (two) times daily. 03/20/19  Yes Delia Chimes A, MD  carvedilol (COREG) 25 MG tablet Take 25 mg by mouth 2 (two) times daily with a meal.   Yes [provider]  cetirizine (ZYRTEC) 10 MG tablet TAKE ONE TABLET BY MOUTH ONCE DAILY FOR ALLERGIES Patient taking differently: Take 10 mg by mouth daily.  12/09/18  Yes Forrest Moron, MD  cinacalcet (SENSIPAR) 60 MG tablet Take 60 mg by mouth daily. 01/05/19  Yes [provider]  clobetasol cream (TEMOVATE) 0.05 % APPLY TO AFFECED ARES(S) TOPICALLY TWICE DAILY Patient taking differently: Apply 1 application topically 2 (two) times daily.  04/18/19   Yes Forrest Moron, MD  Continuous Blood Gluc Receiver (DEXCOM G6 RECEIVER) DEVI 1 Device by Does not apply route as directed. 06/21/19  Yes Renato Shin, MD  Continuous Blood Gluc Sensor (DEXCOM G6 SENSOR) MISC 1 Device by Does not apply route as directed. 06/21/19  Yes Renato Shin, MD  Continuous Blood Gluc Transmit (DEXCOM G6 TRANSMITTER) MISC 1 Device by Does not apply route as directed. 06/21/19  Yes Renato Shin, MD  fluconazole (DIFLUCAN) 100 MG tablet Take 200 mg by mouth once a week.  01/09/19  Yes [provider]  fluticasone (FLONASE) 50 MCG/ACT nasal spray USE TWO SPRAYS INTO EACH NOSTRILS DAILY Patient taking differently: Place 2 sprays into both nostrils daily. USE TWO SPRAYS INTO EACH NOSTRILS DAILY 06/01/19  Yes Stallings, Zoe A, MD  folic acid-vitamin b complex-vitamin c-selenium-zinc (DIALYVITE) 3 MG TABS tablet Take 1 tablet by mouth daily.   Yes  [provider]  furosemide (LASIX) 80 MG tablet Take 1 tablet (80 mg total) by mouth every Tuesday, Thursday, Saturday, and Sunday. 02/25/19  Yes Mariel Aloe, MD  hydrALAZINE (APRESOLINE) 100 MG tablet TAKE 1 TABLET (100 MG TOTAL) BY MOUTH 3 (THREE) TIMES DAILY. 06/20/19  Yes Stallings, Zoe A, MD  Insulin Glargine (LANTUS SOLOSTAR) 100 UNIT/ML Solostar Pen Inject 40 Units into the skin daily. INJECT 30 UNITS UNDER THE SKIN EVERY MORNING AND INJECT 15 UNITS EVERY NIGHT Patient taking differently: Inject 40 Units into the skin daily.  06/15/19  Yes Shamleffer, Melanie Crazier, MD  insulin lispro (HUMALOG KWIKPEN) 100 UNIT/ML KwikPen Inject 0.08 mLs (8 Units total) into the skin 3 (three) times daily. Max daily dose of 100 units daily Patient taking differently: Inject 10 Units into the skin 3 (three) times daily.  06/07/19  Yes Shamleffer, Melanie Crazier, MD  Insulin Pen Needle (B-D UF III MINI PEN NEEDLES) 31G X 5 MM MISC Four times daily 11/17/18  Yes Stallings, Zoe A, MD  lidocaine-prilocaine (EMLA) cream Apply 1  application topically See admin instructions. Apply 1 application topical on Monday Wednesday and fridays 02/28/19  Yes [provider]  Multiple Vitamin (MULTIVITAMIN WITH MINERALS) TABS tablet Take 1 tablet by mouth daily.   Yes [provider]  NARCAN 4 MG/0.1ML LIQD nasal spray kit Place 2 mLs into the nose See admin instructions. May repeat dose every 2 to 3 minutes until responsive or ems arrives. 01/04/19  Yes [provider]  Nicotine 21-14-7 MG/24HR KIT Place 1 patch onto the skin daily. 01/24/19  Yes Parrett, Tammy S, NP  Olopatadine HCl 0.2 % SOLN APPLY 1 DROP TO EYE DAILY. Patient taking differently: Place 1 drop into both eyes daily.  03/29/19  Yes Stallings, Zoe A, MD  ondansetron (ZOFRAN) 4 MG tablet Take 1 tablet (4 mg total) by mouth every 8 (eight) hours as needed for nausea or vomiting. 06/13/19  Yes Stallings, Zoe A, MD  Oxycodone HCl 10 MG TABS Take 10 mg by mouth 4 (four) times daily.  02/01/19  Yes [provider]  pregabalin (LYRICA) 100 MG capsule Take 100 mg by mouth 2 (two) times daily.  03/03/19  Yes [provider]  rOPINIRole (REQUIP) 0.25 MG tablet Take 0.25 mg by mouth at bedtime. 05/15/19  Yes [provider]  verapamil (CALAN-SR) 240 MG CR tablet Take 1 tablet (240 mg total) by mouth at bedtime. 03/07/19  Yes Richardson Dopp T, PA-C    Physical Exam: Vitals:   06/21/19 1722  BP: (!) 144/88  Pulse: 81  Resp: 18  Temp: 98.5 F (36.9 C)  TempSrc: Oral  SpO2: 98%    Constitutional: NAD, calm, comfortable well appearing male laying in bed  Vitals:   06/21/19 1722  BP: (!) 144/88  Pulse: 81  Resp: 18  Temp: 98.5 F (36.9 C)  TempSrc: Oral  SpO2: 98%   Eyes: PERRL, lids and conjunctivae normal ENMT: Mucous membranes are moist. Posterior pharynx clear of any exudate or lesions.Normal dentition.  Neck: normal, supple, no masses Respiratory: clear to auscultation bilaterally, no wheezing, no crackles. Normal  respiratory effort. No accessory muscle use.  Cardiovascular: Regular rate and rhythm, no murmurs / rubs / gallops. No extremity edema. 2+ pedal pulses.  Abdomen: tenderness to palpation of left upper quadrant, no masses palpated. Bowel sounds positive.  Musculoskeletal: no clubbing / cyanosis. No joint deformity upper and lower extremities. Good ROM, no contractures. Normal muscle tone. Left forearm  fistula with thrill and no bruits on auscultation. Skin: no rashes, lesions, ulcers. No induration Neurologic: CN 2-12 grossly intact. Sensation intact. Strength 5/5 in upper extremity with asterixis of the hand.  4 out of 5 strength of the lower extremity. Psychiatric: Normal judgment and insight. Alert and oriented x 3. Normal mood.    Labs on Admission: I have personally reviewed following labs and imaging studies  CBC: Recent Labs  Lab 06/21/19 1747  WBC 10.3  HGB 13.7  HCT 44.0  MCV 97.3  PLT 993*   Basic Metabolic Panel: Recent Labs  Lab 06/21/19 1747  NA 135  K >7.5*  CL 89*  CO2 30  GLUCOSE 73  BUN 84*  CREATININE 12.90*  CALCIUM 10.2   GFR: Estimated Creatinine Clearance: 9 mL/min (A) (by C-G formula based on SCr of 12.9 mg/dL (H)). Liver Function Tests: No results for input(s): AST, ALT, ALKPHOS, BILITOT, PROT, ALBUMIN in the last 168 hours. No results for input(s): LIPASE, AMYLASE in the last 168 hours. No results for input(s): AMMONIA in the last 168 hours. Coagulation Profile: No results for input(s): INR, PROTIME in the last 168 hours. Cardiac Enzymes: No results for input(s): CKTOTAL, CKMB, CKMBINDEX, TROPONINI in the last 168 hours. BNP (last 3 results) No results for input(s): PROBNP in the last 8760 hours. HbA1C: No results for input(s): HGBA1C in the last 72 hours. CBG: No results for input(s): GLUCAP in the last 168 hours. Lipid Profile: No results for input(s): CHOL, HDL, LDLCALC, TRIG, CHOLHDL, LDLDIRECT in the last 72 hours. Thyroid Function  Tests: No results for input(s): TSH, T4TOTAL, FREET4, T3FREE, THYROIDAB in the last 72 hours. Anemia Panel: No results for input(s): VITAMINB12, FOLATE, FERRITIN, TIBC, IRON, RETICCTPCT in the last 72 hours. Urine analysis:    Component Value Date/Time   COLORURINE YELLOW 10/13/2015 1659   APPEARANCEUR CLOUDY (A) 10/13/2015 1659   LABSPEC 1.015 12/22/2016 1210   PHURINE 5.5 12/22/2016 1210   GLUCOSEU 100 (A) 12/22/2016 1210   HGBUR TRACE (A) 12/22/2016 1210   BILIRUBINUR NEGATIVE 12/22/2016 1210   KETONESUR NEGATIVE 12/22/2016 1210   PROTEINUR >=300 (A) 12/22/2016 1210   UROBILINOGEN 0.2 12/22/2016 1210   NITRITE NEGATIVE 12/22/2016 1210   LEUKOCYTESUR TRACE (A) 12/22/2016 1210    Radiological Exams on Admission: Dg Chest Portable 1 View  Result Date: 06/21/2019 CLINICAL DATA:  Shortness of breath. Generalized weakness. Dialysis patient. EXAM: PORTABLE CHEST 1 VIEW COMPARISON:  02/24/2019 FINDINGS: Artifact overlies the chest. There is cardiomegaly with left ventricular prominence. Mediastinal shadows are normal. There is mild venous hypertension but there is no interstitial edema or visible pleural effusion. No sign of consolidation or collapse. Left costophrenic angle excluded from the film. IMPRESSION: Cardiomegaly and mild venous hypertension. No frank edema. No consolidation or collapse. Electronically Signed   By: Nelson Chimes M.D.   On: 06/21/2019 21:48    EKG: Independently reviewed.  Assessment/Plan Hyperkalemia -7.5 on admission with creatinine of 12.9.  Had EKG changes with peaked T waves. -Given IV calcium gluconate, insulin in the ED.  Refused Kayexalate. -Nephrology consulted patient taken for emergent dialysis -Continue to monitor with daily BMP  ESRD on HD M/W/F -Creatinine of 12.9 on admission.  Taken for emergent dialysis today -Appreciate nephrology  OSA -Continue BiPAP  Type 2 diabetes - normally on 30 units in the AM and 15 units at night Lantus. 8  units Humalog at mealtime. -start with 15 units BID. Moderate SSI  Hypertension - continue Hydralazine, verapamil  Chronic diastolic congestive heart failure - continue Coreg, Lasix   Chronic pain - continue oxycodone, Lyrica and requip  Shaking episodes -Followed by neurology outpatient. Thought to be secondary to metabolic asterixis and myoclonic jerking - continue with MRI brain and EEG workup outpatient   DVT prophylaxis:Heparin Code Status:Full Family Communication: Plan discussed with patient at bedside  disposition Plan: Home with at least 2 midnight stays  Consults called:  Nephrology Admission status: inpatient    Jerry Cantrell T Donoven Pett DO [facility]   If 7PM-7AM, please contact night-coverage [email or web address] Password [email or web address]  06/21/2019, 11:52 PM

## 2019-06-21 NOTE — Telephone Encounter (Signed)
Jerry Cantrell from [facility] ph# [phone] requests to be called re: RX for Glucose Monitor

## 2019-06-21 NOTE — ED Triage Notes (Signed)
Pt from home with ems c.o generalized weakness, pt has MRI and CT scheduled next month. Pt reports he cannot walk due to weakness. Dialysis pt MWF, missed today because he went to his PCP appt. Pt a.o

## 2019-06-21 NOTE — Telephone Encounter (Signed)
Returned [employer]. States must go to specialty pharmacy. Dexcom Rx's now re-sent to [facility] who will manage Rx, complete PA and send to correct DME supplier IF needed.

## 2019-06-21 NOTE — Progress Notes (Signed)
PATIENT: Jerry Cantrell DOB: [birth year]  Chief Complaint  Patient presents with  . Episodes of shaking    Reports intermittent, full body shakes.  This happens multiple times daily.  Symptoms can last from 15 minutes and as long as 4 hours.  Says he is unable to control his body during this events.  Marland Kitchen PCP    Forrest Moron, MD     HISTORICAL  Jerry Cantrell is a 53 year old male, seen in request by his primary care physician Dr. Nolon Rod, Gwendolyn Fill for evaluation of episodes of shaking, initial evaluation was on June 21, 2019.  I have reviewed and summarized the referring note from the referring physician.  He has past medical history of hyperlipidemia, diabetes, hypertension, end-stage renal disease, hemodialysis dependent since June 2020, receiving hemodialysis Monday Wednesday Friday, also a longtime smoker.  He drove himself to clinic today, but has gait abnormality, has to be wheeled in by wheelchair.  He is on polypharmacy treatment, this include pain medication Lyrica 100 mg twice a day, oxycodone 10 mg 4 times a day, prolonged treatment of fluconazole 200 mg weekly for fungus infection toenail, was treated with antibiotic for 1 month for skin infection in her left heel, was just stopped 2 weeks ago  Around September 2020, he began to have intermittent body shaking episode, he denied loss of consciousness, usually started by bilateral upper extremity uncontrollable large amplitude shaking, difficulty holding utensils, when this happened, he would have trouble walking, bilateral lower extremity shaking as well, can last 15 minutes up to 4 hours, he felt exhausted afterwards, but no loss of consciousness  I was able to witnessed bilateral upper extremity shaking during interview, there is asterixis  Also complains of significant low back pain, bilateral lower extremity numbness to mid thigh level, gait abnormality,  I personally reviewed CT head on June 29, 2018, there was no acute  abnormality, mild diffuse cerebral and cerebellar atrophy supratentorium small vessel disease chronic sinusitis  Laboratory evaluations showed A1c of 7.4, CBC with hemoglobin of 11.2, BMP, with creatinine of 8.5  REVIEW OF SYSTEMS: Full 14 system review of systems performed and notable only for as above All other review of systems were negative.  ALLERGIES: No Known Allergies  HOME MEDICATIONS: Current Outpatient Medications  Medication Sig Dispense Refill  . ACCU-CHEK AVIVA PLUS test strip USE ONE STRIP 5 (FIVE) TIMES DAILY AS INSTRUCTED 400 each 0  . Accu-Chek Softclix Lancets lancets Use as instructed 450 each 0  . atorvastatin (LIPITOR) 80 MG tablet Take 1 tablet (80 mg total) by mouth daily at 6 PM. 90 tablet 0  . AURYXIA 1 GM 210 MG(Fe) tablet TAKE 3 TABLETS BY MOUTH WITH MEALS AND 1 TABLET WITH SNACKS   SWALLOW WHOLE, DO NOT CHEW OR CRUSH MEDICATION    . bacitracin-polymyxin b (POLYSPORIN) ointment Apply topically 2 (two) times daily. 15 g 0  . carvedilol (COREG) 25 MG tablet Take 25 mg by mouth 2 (two) times daily with a meal.    . cetirizine (ZYRTEC) 10 MG tablet TAKE ONE TABLET BY MOUTH ONCE DAILY FOR ALLERGIES 90 tablet 0  . cinacalcet (SENSIPAR) 60 MG tablet Take 60 mg by mouth daily.    . clobetasol cream (TEMOVATE) 0.05 % APPLY TO AFFECED ARES(S) TOPICALLY TWICE DAILY 45 g 0  . Continuous Blood Gluc Receiver (DEXCOM G6 RECEIVER) DEVI 1 Device by Does not apply route as directed. 1 Device 0  . Continuous Blood Gluc Sensor (DEXCOM G6 SENSOR) MISC 1  Device by Does not apply route as directed. 3 each 6  . Continuous Blood Gluc Transmit (DEXCOM G6 TRANSMITTER) MISC 1 Device by Does not apply route as directed. 1 each 3  . dextrose (GLUTOSE) 40 % GEL Take 37.5 g by mouth once as needed for up to 1 dose for low blood sugar. 37.5 g 1  . fluconazole (DIFLUCAN) 100 MG tablet Take 200 mg by mouth once a week.     . fluticasone (FLONASE) 50 MCG/ACT nasal spray USE TWO SPRAYS INTO EACH  NOSTRILS DAILY 16 g 6  . Fluticasone Furoate (ARNUITY ELLIPTA) 50 MCG/ACT AEPB Inhale into the lungs.    . folic acid-vitamin b complex-vitamin c-selenium-zinc (DIALYVITE) 3 MG TABS tablet Take 1 tablet by mouth daily.    . furosemide (LASIX) 80 MG tablet Take 1 tablet (80 mg total) by mouth every Tuesday, Thursday, Saturday, and Sunday. 120 tablet 0  . GAVILAX powder TAKE ONE CAPFUL (17 GRAMS) BY MOUTH 2 (TWO) TIMES DAILY AS NEEDED. (Patient taking differently: Take 17 g by mouth 2 (two) times daily as needed for mild constipation. ) 510 g 1  . heparin 1000 UNIT/ML injection Heparin Sodium (Porcine) 1,000 Units/mL Systemic    . hydrALAZINE (APRESOLINE) 100 MG tablet TAKE 1 TABLET (100 MG TOTAL) BY MOUTH 3 (THREE) TIMES DAILY. 270 tablet 1  . Insulin Glargine (LANTUS SOLOSTAR) 100 UNIT/ML Solostar Pen Inject 40 Units into the skin daily. INJECT 30 UNITS UNDER THE SKIN EVERY MORNING AND INJECT 15 UNITS EVERY NIGHT 36 mL 3  . insulin lispro (HUMALOG KWIKPEN) 100 UNIT/ML KwikPen Inject 0.08 mLs (8 Units total) into the skin 3 (three) times daily. Max daily dose of 100 units daily (Patient taking differently: Inject 10 Units into the skin 3 (three) times daily. Max daily dose of 100 units daily) 90 mL 0  . Insulin Pen Needle (B-D UF III MINI PEN NEEDLES) 31G X 5 MM MISC Four times daily 360 each 0  . lidocaine-prilocaine (EMLA) cream APPLY SMALL AMOUNT TO ACCESS SITE (AVF) 1 TO 2 HOURS BEFORE DIALYSIS. COVER WITH OCCLUSIVE DRESSING (SARAN WRAP)    . mupirocin ointment (BACTROBAN) 2 % APPLY THIN LAYER INSIDE EACH NOSTRIL TWICE A DAY FOR  5 DAYS    . NARCAN 4 MG/0.1ML LIQD nasal spray kit Place 2 mLs into the nose See admin instructions. May repeat dose every 2 to 3 minutes until responsive or ems arrives.    . Nicotine 21-14-7 MG/24HR KIT Place 1 patch onto the skin daily. 56 each 0  . Olopatadine HCl 0.2 % SOLN APPLY 1 DROP TO EYE DAILY. 2.5 mL 2  . ondansetron (ZOFRAN) 4 MG tablet Take 1 tablet (4 mg  total) by mouth every 8 (eight) hours as needed for nausea or vomiting. 30 tablet 0  . Oxycodone HCl 10 MG TABS Take 10 mg by mouth every 8 (eight) hours.    . pregabalin (LYRICA) 100 MG capsule TAKE 1 CAPSULE BY MOUTH IN THE MORNING AND 1 CAPSULE IN THE EVENING    . rOPINIRole (REQUIP) 0.25 MG tablet Take 0.25 mg by mouth at bedtime.    . silver sulfADIAZINE (SILVADENE) 1 % cream Apply 1 application topically daily. 50 g 0  . tuberculin (APLISOL) 5 UNIT/0.1ML injection Inject into the skin.    . verapamil (CALAN-SR) 240 MG CR tablet Take 1 tablet (240 mg total) by mouth at bedtime. 90 tablet 3   No current facility-administered medications for this visit.  PAST MEDICAL HISTORY: Past Medical History:  Diagnosis Date  . Acute diastolic heart failure ([place])   . CHF (congestive heart failure) ([place])   . Diabetes mellitus with nephropathy ([place]) 05/12/2012   Dr. Justin Mend follows - dialysis three times weekly  . Hemorrhoids 05/12/2012  . Hyperlipidemia   . Hypertension   . Lumbar spinal stenosis 05/03/2012   Mild with only right L4 nerve root encroachment, no neurogenic claudication   . Lumbar spondylosis 05/03/2012  . Meralgia paraesthetica 05/03/2012  . Meralgia paraesthetica 05/03/2012   On Lyrica which does improve pain.   Marland Kitchen Neuropathy in diabetes ([place]) 05/12/2012  . Obesity   . Pneumonia   . Retinopathy   . Sleep apnea    uses cpap  . Substance abuse ([place])   . Urinary incontinence 05/12/2012   Since the start of Sept. 2013     PAST SURGICAL HISTORY: Past Surgical History:  Procedure Laterality Date  . ABDOMINAL SURGERY     Abscess I&D 2/2 infected hair  . AV FISTULA PLACEMENT Left 02/06/2016   Procedure: LEFT ARM RADIOCEPHALIC ARTERIOVENOUS (AV) FISTULA CREATION;  Surgeon: Rosetta Posner, MD;  Location: [facility];  Service: Vascular;  Laterality: Left;  . COLONOSCOPY W/ POLYPECTOMY     pt to bring records  . COLONOSCOPY WITH PROPOFOL N/A 06/05/2016   Procedure: COLONOSCOPY WITH PROPOFOL;   Surgeon: Doran Stabler, MD;  Location: [facility];  Service: Gastroenterology;  Laterality: N/A;  . TONSILLECTOMY      FAMILY HISTORY: Family History  Problem Relation Age of Onset  . Prostate cancer Father   . Alcohol abuse Father   . Emphysema Father        smoked  . Memory loss Mother   . Diabetes Sister   . Colon cancer Neg Hx   . Colon polyps Neg Hx   . Stomach cancer Neg Hx   . Rectal cancer Neg Hx     SOCIAL HISTORY: Social History   Socioeconomic History  . Marital status: Single    Spouse name: Not on file  . Number of children: 4  . Years of education: GED  . Highest education level: Not on file  Occupational History  . Occupation: Disabled  Social Needs  . Financial resource strain: Not on file  . Food insecurity    Worry: Not on file    Inability: Not on file  . Transportation needs    Medical: Not on file    Non-medical: Not on file  Tobacco Use  . Smoking status: Current Every Day Smoker    Packs/day: 0.50    Years: 30.00    Pack years: 15.00    Types: Cigarettes  . Smokeless tobacco: Never Used  Substance and Sexual Activity  . Alcohol use: No    Comment: no longer drinking  . Drug use: No    Types: Marijuana, Cocaine    Comment: no longer using - marijuana "laced with something"; today, stated "no" to question of illegal drug use 06/16/2016  . Sexual activity: Not on file  Lifestyle  . Physical activity    Days per week: Not on file    Minutes per session: Not on file  . Stress: Not on file  Relationships  . Social Herbalist on phone: Not on file    Gets together: Not on file    Attends religious service: Not on file    Active member of club or organization: Not on file  Attends meetings of clubs or organizations: Not on file    Relationship status: Not on file  . Intimate partner violence    Fear of current or ex partner: Not on file    Emotionally abused: Not on file    Physically abused: Not on file    Forced  sexual activity: Not on file  Other Topics Concern  . Not on file  Social History Narrative   Lives at home with a caregiver.   One cup caffeine daily.   Right-handed.     PHYSICAL EXAM   Vitals:   06/21/19 1252  BP: 114/77  Pulse: 75  Temp: 97.6 F (36.4 C)    Not recorded      There is no height or weight on file to calculate BMI.  PHYSICAL EXAMNIATION:  Gen: NAD, conversant, well nourised, well groomed                     Cardiovascular: Regular rate rhythm, no peripheral edema, warm, nontender. Eyes: Conjunctivae clear without exudates or hemorrhage Neck: Supple, no carotid bruits. Pulmonary: Clear to auscultation bilaterally   NEUROLOGICAL EXAM:  MENTAL STATUS: Tired looking middle-age male Speech:    Speech is normal; fluent and spontaneous with normal comprehension.  Cognition:     Orientation to time, place and person     Normal recent and remote memory     Normal Attention span and concentration     Normal Language, naming, repeating,spontaneous speech     Fund of knowledge   CRANIAL NERVES: CN II: Visual fields are full to confrontation.  Pupils are round equal and briskly reactive to light. CN III, IV, VI: extraocular movement are normal. No ptosis. CN V: Facial sensation is intact to pinprick in all 3 divisions bilaterally. Corneal responses are intact.  CN VII: Face is symmetric with normal eye closure and smile. CN VIII: Hearing is normal to causal conversation. CN IX, X: Palate elevates symmetrically. Phonation is normal. CN XI: Head turning and shoulder shrug are intact CN XII: Tongue is midline with normal movements and no atrophy.  MOTOR: There is asterixis at bilateral outstretching arms, there was no significant weakness.  REFLEXES: Reflexes are 1 and symmetric at the biceps, triceps, knees, and ankles. Plantar responses are flexor.  SENSORY: Length dependent decreased to light touch, pinprick, vibratory sensation to bilateral knee  level  COORDINATION: Rapid alternating movements and fine finger movements are intact. There is no dysmetria on finger-to-nose and heel-knee-shin.    GAIT/STANCE: He needs assistant to get up from seated position, bilateral lower extremity large amplitude tremor, steady gait  DIAGNOSTIC DATA (LABS, IMAGING, TESTING) - I reviewed patient records, labs, notes, testing and imaging myself where available.   ASSESSMENT AND PLAN  Nicolaos Mitrano is a 53 y.o. male   Body shaking episode  Most suggestive of metabolic asterixis, myoclonic jerking  MRI of brain  EEG  I have advised him to simplify his medications, decrease pain medicine to lower dose  Gait abnormalities  Multifactorial, including obesity, deconditioning, peripheral neuropathy, possible lumbar radiculopathy  MRI of lumbar  Is already receiving physical therapy  Marcial Pacas, M.D. Ph.D.  [facility] [address] [place], [place] [address] Ph: [phone] Fax: [phone]  CC: Forrest Moron, MD

## 2019-06-21 NOTE — Progress Notes (Signed)
Hyperkalemia noted in ER   Patient has shortened dialysis 10/19  3hr 49   10/17   2 hr 19    History  ESRD  MWF  Chronic pain and narcotic use  Diabetes  HTN and tobacco abuse  180 NR  2 K  2 Ca  Na 137 Duration 4:30  EDW 120    Seen by Neurology today with complaints of Body shaking and gait abnormalities   --   MRI  Brain   EEG and MRI lumbar ordered  Patient came to ER brought by EMS with generalized weakness  BP 136/70  P 86   T 98.5    sats 98%  Na 135   K  >7.5    Cl   89   CO2  30   BUN  84   Cr  12.9    Glc 73 Ca  10.2  Wbc 10.3  Hb 13.7  Plt  145  Temporized in ER  Kayexalate   Calcium   Insulin and glucose  Will plan emergent dialysis

## 2019-06-21 NOTE — ED Notes (Signed)
Pt found in bathroom in [place]. Pt fell trying to get out of wheelchair to use RR. This NT, Maggie, Therapist, sports, (2) Security guards and [place], [place] assisted pt to toilet. Pt will pull light when finished. No complaints.

## 2019-06-21 NOTE — Telephone Encounter (Signed)
Medical necessity for faxed to [phone] and [phone]

## 2019-06-21 NOTE — ED Notes (Signed)
Notified EDP that pt still refused Kayexalate until he gets a room.

## 2019-06-21 NOTE — ED Provider Notes (Signed)
[facility] EMERGENCY DEPARTMENT Provider Note   CSN: [id number] Arrival date & time: 06/21/19  1651     History   Chief Complaint Chief Complaint  Patient presents with  . Weakness    HPI Jerry Cantrell is a 53 y.o. male.     HPI   Episodes of "nervous system shutting down"  Right now not bothering him but at times Episodes of generalized weakness, can't lift up head, right sided weakness worse than left, tremors and weakness, like yesterday trying to eat but couldn't Shortness of breath today Nausea for a few weeks Stopped cinacalcet and then restarted it one month   MWF, had dialysis on Monday  Episodes last 15 minutes, 3-7PM yesterday   Past Medical History:  Diagnosis Date  . Acute diastolic heart failure ([place])   . CHF (congestive heart failure) ([place])   . Diabetes mellitus with nephropathy ([place]) 05/12/2012   Dr. Justin Mend follows - dialysis three times weekly  . Hemorrhoids 05/12/2012  . Hyperlipidemia   . Hypertension   . Lumbar spinal stenosis 05/03/2012   Mild with only right L4 nerve root encroachment, no neurogenic claudication   . Lumbar spondylosis 05/03/2012  . Meralgia paraesthetica 05/03/2012  . Meralgia paraesthetica 05/03/2012   On Lyrica which does improve pain.   Marland Kitchen Neuropathy in diabetes ([place]) 05/12/2012  . Obesity   . Pneumonia   . Retinopathy   . Sleep apnea    uses cpap  . Substance abuse ([place])   . Urinary incontinence 05/12/2012   Since the start of Sept. 2013     Patient Active Problem List   Diagnosis Date Noted  . Shaking 06/21/2019  . Bilateral low back pain without sciatica 06/21/2019  . Gait abnormality 06/21/2019  . Hyperkalemia 06/21/2019  . Type 2 diabetes mellitus with diabetic polyneuropathy, with long-term current use of insulin ([place]) 06/15/2019  . Type 2 diabetes mellitus with proliferative retinopathy of both eyes, with long-term current use of insulin ([place]) 06/15/2019  . Diabetes mellitus with end stage renal  disease ([place]) 06/15/2019  . ESRD (end stage renal disease) ([place]) 02/24/2019  . Volume overload 02/20/2019  . Cellulitis of left upper extremity   . Inflammatory monoarthritis of wrist, left   . Inflammatory arthropathy 09/17/2018  . Hyperparathyroidism ([place]) 08/18/2018  . Primary insomnia 08/18/2018  . Chronic pain syndrome 08/18/2018  . CKD stage 5 due to type 2 diabetes mellitus ([place]) 08/18/2018  . COPD (chronic obstructive pulmonary disease) ([place]) 02/10/2017  . CKD (chronic kidney disease), stage V ([place]) 01/06/2016  . CKD (chronic kidney disease), stage III   . Type 2 diabetes, uncontrolled, with neuropathy ([place])   . Marijuana abuse   . Tobacco abuse   . Essential hypertension, malignant   . Chronic kidney disease, stage IV (severe) ([place])   . Legally blind 10/13/2015  . Cocaine use disorder, moderate, dependence ([place]) 07/02/2015  . Accelerated hypertension   . Uncontrolled type 2 diabetes mellitus with ketoacidosis without coma ([place])   . Cocaine abuse ([place])   . Hypertensive emergency 06/06/2015  . Chronic respiratory failure ([place])   . Pulmonary nodule 05/22/2015  . Morbid obesity with BMI of 40.0-44.9, adult ([place]) 03/26/2015  . Vision loss, bilateral 02/22/2015  . Nicotine dependence, cigarettes, with other nicotine-induced disorders 02/22/2015  . Thrombocytopenia ([place]) 12/04/2013  . Diastolic heart failure secondary to hypertension ([place]) 11/30/2013  . Radiculopathy with lower extremity symptoms 11/28/2013  . Depression 06/16/2013  . Chronic recurrent major depressive disorder ([place]) 06/16/2013  .  Proliferative diabetic retinopathy ([place]) 06/08/2013  . Diabetic macular edema of left eye, with cataract, associated with type 2 diabetes mellitus ([place]) 06/08/2013  . Meibomianitis 06/08/2013  . Erectile dysfunction associated with type 2 diabetes mellitus ([place]) 04/22/2013  . Decreased peripheral vision of left eye 04/12/2013  . Plantar fasciitis of right foot 04/12/2013  . OSA  treated with BiPAP 02/02/2013  . Diabetic peripheral neuropathy associated with type 2 diabetes mellitus ([place]) 12/27/2012  . Abnormality of gait 12/06/2012  . Family history of malignant neoplasm of gastrointestinal tract 10/10/2012  . Personal history of colonic polyps 10/10/2012  . Skin tag 08/18/2012  . Chronic low back pain 07/15/2012  . Preventative health care 06/02/2012  . Hyperlipemia 05/12/2012  . Hypertension 05/12/2012  . Hemorrhoids 05/12/2012  . Blood per rectum 05/12/2012  . Meralgia paraesthetica 05/03/2012  . Lumbar spondylosis 05/03/2012  . Lumbar spinal stenosis 05/03/2012  . Insulin dependent type 2 diabetes mellitus, uncontrolled ([place]) 05/13/1999    Past Surgical History:  Procedure Laterality Date  . ABDOMINAL SURGERY     Abscess I&D 2/2 infected hair  . AV FISTULA PLACEMENT Left 02/06/2016   Procedure: LEFT ARM RADIOCEPHALIC ARTERIOVENOUS (AV) FISTULA CREATION;  Surgeon: Rosetta Posner, MD;  Location: [facility];  Service: Vascular;  Laterality: Left;  . COLONOSCOPY W/ POLYPECTOMY     pt to bring records  . COLONOSCOPY WITH PROPOFOL N/A 06/05/2016   Procedure: COLONOSCOPY WITH PROPOFOL;  Surgeon: Doran Stabler, MD;  Location: [facility];  Service: Gastroenterology;  Laterality: N/A;  . TONSILLECTOMY          Home Medications    Prior to Admission medications   Medication Sig Start Date End Date Taking? Authorizing Provider  ACCU-CHEK AVIVA PLUS test strip USE ONE STRIP 5 (FIVE) TIMES DAILY AS INSTRUCTED 04/24/19  Yes Delia Chimes A, MD  Accu-Chek Softclix Lancets lancets Use as instructed 11/17/18  Yes Stallings, Zoe A, MD  atorvastatin (LIPITOR) 80 MG tablet Take 1 tablet (80 mg total) by mouth daily at 6 PM. 11/17/18  Yes Stallings, Zoe A, MD  AURYXIA 1 GM 210 MG(Fe) tablet Take 210 mg by mouth 3 (three) times daily with meals.  05/15/19  Yes [provider]  bacitracin-polymyxin b (POLYSPORIN) ointment Apply topically 2 (two) times daily. 03/20/19   Yes Delia Chimes A, MD  carvedilol (COREG) 25 MG tablet Take 25 mg by mouth 2 (two) times daily with a meal.   Yes [provider]  cetirizine (ZYRTEC) 10 MG tablet TAKE ONE TABLET BY MOUTH ONCE DAILY FOR ALLERGIES Patient taking differently: Take 10 mg by mouth daily.  12/09/18  Yes Forrest Moron, MD  cinacalcet (SENSIPAR) 60 MG tablet Take 60 mg by mouth daily. 01/05/19  Yes [provider]  clobetasol cream (TEMOVATE) 0.05 % APPLY TO AFFECED ARES(S) TOPICALLY TWICE DAILY Patient taking differently: Apply 1 application topically 2 (two) times daily.  04/18/19  Yes Forrest Moron, MD  Continuous Blood Gluc Receiver (DEXCOM G6 RECEIVER) DEVI 1 Device by Does not apply route as directed. 06/21/19  Yes Renato Shin, MD  Continuous Blood Gluc Sensor (DEXCOM G6 SENSOR) MISC 1 Device by Does not apply route as directed. 06/21/19  Yes Renato Shin, MD  Continuous Blood Gluc Transmit (DEXCOM G6 TRANSMITTER) MISC 1 Device by Does not apply route as directed. 06/21/19  Yes Renato Shin, MD  fluconazole (DIFLUCAN) 100 MG tablet Take 200 mg by mouth once a week.  01/09/19  Yes [provider]  fluticasone (FLONASE) 50 MCG/ACT nasal spray USE TWO SPRAYS INTO EACH NOSTRILS DAILY Patient taking differently: Place 2 sprays into both nostrils daily. USE TWO SPRAYS INTO EACH NOSTRILS DAILY 06/01/19  Yes Stallings, Zoe A, MD  folic acid-vitamin b complex-vitamin c-selenium-zinc (DIALYVITE) 3 MG TABS tablet Take 1 tablet by mouth daily.   Yes [provider]  furosemide (LASIX) 80 MG tablet Take 1 tablet (80 mg total) by mouth every Tuesday, Thursday, Saturday, and Sunday. 02/25/19  Yes Mariel Aloe, MD  hydrALAZINE (APRESOLINE) 100 MG tablet TAKE 1 TABLET (100 MG TOTAL) BY MOUTH 3 (THREE) TIMES DAILY. 06/20/19  Yes Stallings, Zoe A, MD  Insulin Glargine (LANTUS SOLOSTAR) 100 UNIT/ML Solostar Pen Inject 40 Units into the skin daily. INJECT 30 UNITS UNDER THE SKIN EVERY MORNING  AND INJECT 15 UNITS EVERY NIGHT Patient taking differently: Inject 40 Units into the skin daily.  06/15/19  Yes Shamleffer, Melanie Crazier, MD  insulin lispro (HUMALOG KWIKPEN) 100 UNIT/ML KwikPen Inject 0.08 mLs (8 Units total) into the skin 3 (three) times daily. Max daily dose of 100 units daily Patient taking differently: Inject 10 Units into the skin 3 (three) times daily.  06/07/19  Yes Shamleffer, Melanie Crazier, MD  Insulin Pen Needle (B-D UF III MINI PEN NEEDLES) 31G X 5 MM MISC Four times daily 11/17/18  Yes Stallings, Zoe A, MD  lidocaine-prilocaine (EMLA) cream Apply 1 application topically See admin instructions. Apply 1 application topical on Monday Wednesday and fridays 02/28/19  Yes [provider]  Multiple Vitamin (MULTIVITAMIN WITH MINERALS) TABS tablet Take 1 tablet by mouth daily.   Yes [provider]  NARCAN 4 MG/0.1ML LIQD nasal spray kit Place 2 mLs into the nose See admin instructions. May repeat dose every 2 to 3 minutes until responsive or ems arrives. 01/04/19  Yes [provider]  Nicotine 21-14-7 MG/24HR KIT Place 1 patch onto the skin daily. 01/24/19  Yes Parrett, Tammy S, NP  Olopatadine HCl 0.2 % SOLN APPLY 1 DROP TO EYE DAILY. Patient taking differently: Place 1 drop into both eyes daily.  03/29/19  Yes Stallings, Zoe A, MD  ondansetron (ZOFRAN) 4 MG tablet Take 1 tablet (4 mg total) by mouth every 8 (eight) hours as needed for nausea or vomiting. 06/13/19  Yes Stallings, Zoe A, MD  Oxycodone HCl 10 MG TABS Take 10 mg by mouth 4 (four) times daily.  02/01/19  Yes [provider]  pregabalin (LYRICA) 100 MG capsule Take 100 mg by mouth 2 (two) times daily.  03/03/19  Yes [provider]  rOPINIRole (REQUIP) 0.25 MG tablet Take 0.25 mg by mouth at bedtime. 05/15/19  Yes [provider]  verapamil (CALAN-SR) 240 MG CR tablet Take 1 tablet (240 mg total) by mouth at bedtime. 03/07/19  Yes Liliane Shi, PA-C    Family  History Family History  Problem Relation Age of Onset  . Prostate cancer Father   . Alcohol abuse Father   . Emphysema Father        smoked  . Memory loss Mother   . Diabetes Sister   . Colon cancer Neg Hx   . Colon polyps Neg Hx   . Stomach cancer Neg Hx   . Rectal cancer Neg Hx     Social History Social History   Tobacco Use  . Smoking status: Current Every Day Smoker    Packs/day: 0.50    Years: 30.00    Pack years: 15.00  Types: Cigarettes  . Smokeless tobacco: Never Used  Substance Use Topics  . Alcohol use: No    Comment: no longer drinking  . Drug use: No    Types: Marijuana, Cocaine    Comment: no longer using - marijuana "laced with something"; today, stated "no" to question of illegal drug use 06/16/2016     Allergies   Patient has no known allergies.   Review of Systems Review of Systems  Constitutional: Negative for fever.  Eyes: Negative for visual disturbance.  Respiratory: Positive for shortness of breath. Negative for cough.   Cardiovascular: Negative for chest pain.  Gastrointestinal: Positive for abdominal pain (for a few weeks) and nausea. Negative for constipation, diarrhea and vomiting.  Genitourinary: Difficulty urinating: makes very little, needs to concentrate.  Neurological: Positive for tremors and weakness. Negative for facial asymmetry, numbness and headaches. Speech difficulty: when it happens can't chew or talk.     Physical Exam Updated Vital Signs BP (!) 147/89   Pulse 76   Temp 98.5 F (36.9 C) (Oral)   Resp 12   SpO2 100%   Physical Exam Vitals signs and nursing note reviewed.  Constitutional:      General: He is not in acute distress.    Appearance: He is well-developed. He is not diaphoretic.  HENT:     Head: Normocephalic and atraumatic.  Eyes:     Conjunctiva/sclera: Conjunctivae normal.  Neck:     Musculoskeletal: Normal range of motion.  Cardiovascular:     Rate and Rhythm: Normal rate and regular rhythm.      Heart sounds: Normal heart sounds. No murmur. No friction rub. No gallop.   Pulmonary:     Effort: Pulmonary effort is normal. No respiratory distress.     Breath sounds: Normal breath sounds. No wheezing or rales.  Abdominal:     General: There is no distension.     Palpations: Abdomen is soft.     Tenderness: There is abdominal tenderness (mild epigastric). There is no guarding.  Skin:    General: Skin is warm and dry.  Neurological:     Mental Status: He is alert and oriented to person, place, and time.     GCS: GCS eye subscore is 4. GCS verbal subscore is 5. GCS motor subscore is 6.     Comments: Generalized weakness      ED Treatments / Results  Labs (all labs ordered are listed, but only abnormal results are displayed) Labs Reviewed  BASIC METABOLIC PANEL - Abnormal; Notable for the following components:      Result Value   Potassium >7.5 (*)    Chloride 89 (*)    BUN 84 (*)    Creatinine, Ser 12.90 (*)    GFR calc non Af Amer 4 (*)    GFR calc Af Amer 5 (*)    Anion gap 16 (*)    All other components within normal limits  CBC - Abnormal; Notable for the following components:   Platelets 145 (*)    All other components within normal limits  SARS CORONAVIRUS 2 BY RT PCR (HOSPITAL ORDER, [facility])  BASIC METABOLIC PANEL  CBC  RENAL FUNCTION PANEL    EKG EKG Interpretation  Date/Time:  Wednesday June 21 2019 17:24:32 EDT Ventricular Rate:  79 PR Interval:  302 QRS Duration: 136 QT Interval:  388 QTC Calculation: 444 R Axis:   -90 Text Interpretation:  Sinus rhythm with 1st degree A-V block with  Premature atrial complexes with Abberant conduction Left axis deviation Right bundle branch block Abnormal ECG Slight peaked TW in comparison to prior , widening of QRS Confirmed by Gareth Morgan [phone]) on 06/21/2019 8:42:24 PM   Radiology Dg Chest Portable 1 View  Result Date: 06/21/2019 CLINICAL DATA:  Shortness of breath.  Generalized weakness. Dialysis patient. EXAM: PORTABLE CHEST 1 VIEW COMPARISON:  02/24/2019 FINDINGS: Artifact overlies the chest. There is cardiomegaly with left ventricular prominence. Mediastinal shadows are normal. There is mild venous hypertension but there is no interstitial edema or visible pleural effusion. No sign of consolidation or collapse. Left costophrenic angle excluded from the film. IMPRESSION: Cardiomegaly and mild venous hypertension. No frank edema. No consolidation or collapse. Electronically Signed   By: Nelson Chimes M.D.   On: 06/21/2019 21:48    Procedures .Critical Care Performed by: Gareth Morgan, MD Authorized by: Gareth Morgan, MD   Critical care provider statement:    Critical care time (minutes):  45   Critical care was necessary to treat or prevent imminent or life-threatening deterioration of the following conditions:  Metabolic crisis   Critical care was time spent personally by me on the following activities:  Discussions with consultants, evaluation of patient's response to treatment, examination of patient, ordering and performing treatments and interventions, ordering and review of laboratory studies, ordering and review of radiographic studies, pulse oximetry, re-evaluation of patient's condition, obtaining history from patient or surrogate and review of old charts   (including critical care time)  Medications Ordered in ED Medications  sodium chloride flush (NS) 0.9 % injection 3 mL (3 mLs Intravenous Not Given 06/21/19 2201)  sodium polystyrene (KAYEXALATE) 15 GM/60ML suspension 60 g (0 g Oral Hold 06/21/19 2151)  Chlorhexidine Gluconate Cloth 2 % PADS 6 each (has no administration in time range)  pentafluoroprop-tetrafluoroeth (GEBAUERS) aerosol 1 application (has no administration in time range)  lidocaine (PF) (XYLOCAINE) 1 % injection 5 mL (has no administration in time range)  lidocaine-prilocaine (EMLA) cream 1 application (has no  administration in time range)  0.9 %  sodium chloride infusion (has no administration in time range)  0.9 %  sodium chloride infusion (has no administration in time range)  heparin injection 1,000 Units (has no administration in time range)  alteplase (CATHFLO ACTIVASE) injection 2 mg (has no administration in time range)  heparin injection 5,000 Units (has no administration in time range)  calcium gluconate 1 g in sodium chloride 0.9 % 100 mL IVPB (0 g Intravenous Stopped 06/21/19 2245)  insulin aspart (novoLOG) injection 5 Units (5 Units Intravenous Given 06/21/19 2137)  dextrose 50 % solution 50 mL (50 mLs Intravenous Given 06/21/19 2139)  calcium gluconate 1 g/ 50 mL sodium chloride IVPB (1,000 mg Intravenous Transfusing/Transfer 06/22/19 0106)     Initial Impression / Assessment and Plan / ED Course  I have reviewed the triage vital signs and the nursing notes.  Pertinent labs & imaging results that were available during my care of the patient were reviewed by me and considered in my medical decision making (see chart for details).        53 year old male with a history of ESRD on dialysis Monday Wednesday Friday, diabetes, hypertension, hyperlipidemia, neuralgia paresthetica, substance abuse, who presents with concern for episodes of "nervous system shutting down" with severe generalized weakness, right side worse than left.  He missed dialysis today as he was out of primary care physician's appointment.  Labs today are significant for potassium greater than 7.5.  EKG with  finding of mildly peaked T waves, QRS appears slightly wider than previous.  Patient was given calcium, insulin, dextrose, and nephrology was called emergently.  Patient was given additional calcium while waiting in the emergency department to go to dialysis.  Plan for patient to have dialysis and be admitted to medicine for further care, further evaluation of episodes of weakness.  Unclear if these are secondary to  electrolyte abnormalities or other metabolic disturbance, or may be related to other abnormality such as seizure, TIA. Also reports some abdominal tenderness on exam, low suspicion at this time for acute intraabdominal surgical abnormality and most critical abnormality is severe hyperkalemia.   Final Clinical Impressions(s) / ED Diagnoses   Final diagnoses:  Generalized weakness  Hyperkalemia    ED Discharge Orders    None       Gareth Morgan, MD 06/22/19 [phone]

## 2019-06-22 ENCOUNTER — Other Ambulatory Visit: Payer: Self-pay

## 2019-06-22 ENCOUNTER — Encounter (HOSPITAL_COMMUNITY): Payer: Self-pay | Admitting: *Deleted

## 2019-06-22 ENCOUNTER — Telehealth: Payer: Self-pay | Admitting: Neurology

## 2019-06-22 DIAGNOSIS — E8779 Other fluid overload: Secondary | ICD-10-CM

## 2019-06-22 LAB — BASIC METABOLIC PANEL
Anion gap: 15 (ref 5–15)
Anion gap: 13 (ref 5–15)
BUN: 50 mg/dL — ABNORMAL HIGH (ref 6–20)
BUN: 38 mg/dL — ABNORMAL HIGH (ref 6–20)
CO2: 27 mmol/L (ref 22–32)
CO2: 29 mmol/L (ref 22–32)
Calcium: 9.6 mg/dL (ref 8.9–10.3)
Calcium: 10.4 mg/dL — ABNORMAL HIGH (ref 8.9–10.3)
Chloride: 92 mmol/L — ABNORMAL LOW (ref 98–111)
Chloride: 91 mmol/L — ABNORMAL LOW (ref 98–111)
Creatinine, Ser: 9.72 mg/dL — ABNORMAL HIGH (ref 0.61–1.24)
Creatinine, Ser: 8.32 mg/dL — ABNORMAL HIGH (ref 0.61–1.24)
GFR calc Af Amer: 6 mL/min — ABNORMAL LOW (ref >60)
GFR calc Af Amer: 8 mL/min — ABNORMAL LOW (ref >60)
GFR calc non Af Amer: 5 mL/min — ABNORMAL LOW (ref >60)
GFR calc non Af Amer: 7 mL/min — ABNORMAL LOW (ref >60)
Glucose, Bld: 280 mg/dL — ABNORMAL HIGH (ref 70–99)
Glucose, Bld: 143 mg/dL — ABNORMAL HIGH (ref 70–99)
Potassium: 7 mmol/L (ref 3.5–5.1)
Potassium: 6.5 mmol/L (ref 3.5–5.1)
Sodium: 134 mmol/L — ABNORMAL LOW (ref 135–145)
Sodium: 133 mmol/L — ABNORMAL LOW (ref 135–145)

## 2019-06-22 LAB — RENAL FUNCTION PANEL
Albumin: 3.5 g/dL (ref 3.5–5.0)
Anion gap: 17 — ABNORMAL HIGH (ref 5–15)
BUN: 86 mg/dL — ABNORMAL HIGH (ref 6–20)
CO2: 29 mmol/L (ref 22–32)
Calcium: 10.3 mg/dL (ref 8.9–10.3)
Chloride: 89 mmol/L — ABNORMAL LOW (ref 98–111)
Creatinine, Ser: 13.47 mg/dL — ABNORMAL HIGH (ref 0.61–1.24)
GFR calc Af Amer: 4 mL/min — ABNORMAL LOW (ref >60)
GFR calc non Af Amer: 4 mL/min — ABNORMAL LOW (ref >60)
Glucose, Bld: 74 mg/dL (ref 70–99)
Phosphorus: 8.3 mg/dL — ABNORMAL HIGH (ref 2.5–4.6)
Potassium: 7.4 mmol/L (ref 3.5–5.1)
Sodium: 135 mmol/L (ref 135–145)

## 2019-06-22 LAB — CBC
HCT: 39.6 % (ref 39.0–52.0)
Hemoglobin: 12.5 g/dL — ABNORMAL LOW (ref 13.0–17.0)
MCH: 30.4 pg (ref 26.0–34.0)
MCHC: 31.6 g/dL (ref 30.0–36.0)
MCV: 96.4 fL (ref 80.0–100.0)
Platelets: 119 10*3/uL — ABNORMAL LOW (ref 150–400)
RBC: 4.11 MIL/uL — ABNORMAL LOW (ref 4.22–5.81)
RDW: 14.4 % (ref 11.5–15.5)
WBC: 7.4 10*3/uL (ref 4.0–10.5)
nRBC: 0 % (ref 0.0–0.2)

## 2019-06-22 LAB — GLUCOSE, CAPILLARY
Glucose-Capillary: 122 mg/dL — ABNORMAL HIGH (ref 70–99)
Glucose-Capillary: 142 mg/dL — ABNORMAL HIGH (ref 70–99)
Glucose-Capillary: 433 mg/dL — ABNORMAL HIGH (ref 70–99)
Glucose-Capillary: 339 mg/dL — ABNORMAL HIGH (ref 70–99)
Glucose-Capillary: 117 mg/dL — ABNORMAL HIGH (ref 70–99)
Glucose-Capillary: 112 mg/dL — ABNORMAL HIGH (ref 70–99)

## 2019-06-22 MED ORDER — INSULIN ASPART 100 UNIT/ML ~~LOC~~ SOLN
8.0000 [IU] | Freq: Three times a day (TID) | SUBCUTANEOUS | Status: DC
  Administered 2019-06-22 – 2019-06-24 (×5): 8 [IU] via SUBCUTANEOUS

## 2019-06-22 MED ORDER — ALTEPLASE 2 MG IJ SOLR: 2.0000 mg | Freq: Once | INTRAMUSCULAR | Status: DC | PRN

## 2019-06-22 MED ORDER — CINACALCET HCL 30 MG PO TABS
60.0000 mg | ORAL_TABLET | Freq: Every day | ORAL | Status: DC
  Administered 2019-06-22 – 2019-06-24 (×3): 60 mg via ORAL
  Filled 2019-06-22 (×3): qty 2

## 2019-06-22 MED ORDER — LIDOCAINE-PRILOCAINE 2.5-2.5 % EX CREA: 1.0000 "application " | TOPICAL_CREAM | CUTANEOUS | Status: DC | PRN

## 2019-06-22 MED ORDER — INSULIN ASPART 100 UNIT/ML IV SOLN
10.0000 [IU] | Freq: Once | INTRAVENOUS | Status: AC
  Administered 2019-06-22: 10 [IU] via INTRAVENOUS

## 2019-06-22 MED ORDER — FERRIC CITRATE 1 GM 210 MG(FE) PO TABS
210.0000 mg | ORAL_TABLET | Freq: Three times a day (TID) | ORAL | Status: DC
  Administered 2019-06-22 – 2019-06-24 (×7): 210 mg via ORAL
  Filled 2019-06-22 (×7): qty 1

## 2019-06-22 MED ORDER — FUROSEMIDE 80 MG PO TABS
80.0000 mg | ORAL_TABLET | ORAL | Status: DC
  Administered 2019-06-22 – 2019-06-24 (×2): 80 mg via ORAL
  Filled 2019-06-22: qty 4
  Filled 2019-06-22: qty 1

## 2019-06-22 MED ORDER — CHLORHEXIDINE GLUCONATE CLOTH 2 % EX PADS: 6.0000 | MEDICATED_PAD | Freq: Every day | CUTANEOUS | Status: DC

## 2019-06-22 MED ORDER — PREGABALIN 100 MG PO CAPS
100.0000 mg | ORAL_CAPSULE | Freq: Two times a day (BID) | ORAL | Status: DC
  Administered 2019-06-22 – 2019-06-24 (×4): 100 mg via ORAL
  Filled 2019-06-22 (×4): qty 1

## 2019-06-22 MED ORDER — VERAPAMIL HCL ER 240 MG PO TBCR
240.0000 mg | EXTENDED_RELEASE_TABLET | Freq: Every day | ORAL | Status: DC
  Administered 2019-06-22 – 2019-06-23 (×2): 240 mg via ORAL
  Filled 2019-06-22 (×4): qty 1

## 2019-06-22 MED ORDER — INSULIN GLARGINE 100 UNIT/ML ~~LOC~~ SOLN
15.0000 [IU] | Freq: Every day | SUBCUTANEOUS | Status: DC
  Administered 2019-06-23: 15 [IU] via SUBCUTANEOUS
  Filled 2019-06-22 (×2): qty 0.15

## 2019-06-22 MED ORDER — INSULIN GLARGINE 100 UNIT/ML ~~LOC~~ SOLN
30.0000 [IU] | Freq: Every day | SUBCUTANEOUS | Status: DC
  Administered 2019-06-23 – 2019-06-24 (×2): 30 [IU] via SUBCUTANEOUS
  Filled 2019-06-22 (×2): qty 0.3

## 2019-06-22 MED ORDER — HEPARIN SODIUM (PORCINE) 1000 UNIT/ML IJ SOLN
INTRAMUSCULAR | Status: AC
  Filled 2019-06-22: qty 3

## 2019-06-22 MED ORDER — SODIUM ZIRCONIUM CYCLOSILICATE 5 G PO PACK
5.0000 g | PACK | Freq: Once | ORAL | Status: AC
  Administered 2019-06-22: 5 g via ORAL
  Filled 2019-06-22: qty 1

## 2019-06-22 MED ORDER — PENTAFLUOROPROP-TETRAFLUOROETH EX AERO: 1.0000 "application " | INHALATION_SPRAY | CUTANEOUS | Status: DC | PRN

## 2019-06-22 MED ORDER — INSULIN GLARGINE 100 UNIT/ML ~~LOC~~ SOLN
15.0000 [IU] | Freq: Two times a day (BID) | SUBCUTANEOUS | Status: DC
  Administered 2019-06-22: 15 [IU] via SUBCUTANEOUS
  Filled 2019-06-22 (×2): qty 0.15

## 2019-06-22 MED ORDER — LIDOCAINE HCL (PF) 1 % IJ SOLN: 5.0000 mL | INTRAMUSCULAR | Status: DC | PRN

## 2019-06-22 MED ORDER — ATORVASTATIN CALCIUM 80 MG PO TABS
80.0000 mg | ORAL_TABLET | Freq: Every day | ORAL | Status: DC
  Administered 2019-06-22 – 2019-06-23 (×2): 80 mg via ORAL
  Filled 2019-06-22 (×2): qty 1

## 2019-06-22 MED ORDER — SODIUM CHLORIDE 0.9 % IV SOLN: 100.0000 mL | INTRAVENOUS | Status: DC | PRN

## 2019-06-22 MED ORDER — HEPARIN SODIUM (PORCINE) 1000 UNIT/ML DIALYSIS
2600.0000 [IU] | INTRAMUSCULAR | Status: DC | PRN
  Administered 2019-06-22: 2600 [IU] via INTRAVENOUS_CENTRAL

## 2019-06-22 MED ORDER — HEPARIN SODIUM (PORCINE) 1000 UNIT/ML DIALYSIS
1000.0000 [IU] | INTRAMUSCULAR | Status: DC | PRN
  Administered 2019-06-23: 2600 [IU] via INTRAVENOUS_CENTRAL

## 2019-06-22 MED ORDER — CARVEDILOL 25 MG PO TABS
25.0000 mg | ORAL_TABLET | Freq: Two times a day (BID) | ORAL | Status: DC
  Administered 2019-06-22 – 2019-06-24 (×3): 25 mg via ORAL
  Filled 2019-06-22 (×3): qty 1

## 2019-06-22 MED ORDER — INSULIN ASPART 100 UNIT/ML ~~LOC~~ SOLN
0.0000 [IU] | Freq: Three times a day (TID) | SUBCUTANEOUS | Status: DC
  Administered 2019-06-22: 2 [IU] via SUBCUTANEOUS
  Administered 2019-06-22: 11 [IU] via SUBCUTANEOUS
  Administered 2019-06-23: 16 [IU] via SUBCUTANEOUS
  Administered 2019-06-23: 5 [IU] via SUBCUTANEOUS
  Administered 2019-06-24 (×2): 3 [IU] via SUBCUTANEOUS

## 2019-06-22 MED ORDER — HYDRALAZINE HCL 25 MG PO TABS
100.0000 mg | ORAL_TABLET | Freq: Three times a day (TID) | ORAL | Status: DC
  Administered 2019-06-22 – 2019-06-23 (×3): 100 mg via ORAL
  Filled 2019-06-22 (×5): qty 4

## 2019-06-22 MED ORDER — SODIUM POLYSTYRENE SULFONATE 15 GM/60ML PO SUSP
30.0000 g | Freq: Once | ORAL | Status: AC
  Administered 2019-06-22: 30 g via ORAL
  Filled 2019-06-22: qty 120

## 2019-06-22 MED ORDER — OXYCODONE HCL 5 MG PO TABS
10.0000 mg | ORAL_TABLET | Freq: Four times a day (QID) | ORAL | Status: DC
  Administered 2019-06-22 – 2019-06-24 (×7): 10 mg via ORAL
  Filled 2019-06-22 (×7): qty 2

## 2019-06-22 MED ORDER — ROPINIROLE HCL 0.25 MG PO TABS
0.2500 mg | ORAL_TABLET | Freq: Every day | ORAL | Status: DC
  Administered 2019-06-22 – 2019-06-23 (×2): 0.25 mg via ORAL
  Filled 2019-06-22 (×3): qty 1

## 2019-06-22 NOTE — Progress Notes (Signed)
PROGRESS NOTE    Jerry Cantrell  [id number] DOB: [birth year] DOA: 06/21/2019 PCP: Forrest Moron, MD   Brief Narrative: 53 year old male ESRD on HD MWF diastolic CHF IDDM type II OSA on BiPAP, HTN presents with dyspnea "nervous system shutting down".Per HPI "he could not even hold his head up or ambulate.he would occasionally start shaking  from his shoulders and down to his lower extremity, no LOC. Episodes happening few times/month but now it is about 2-3 times a day. He had associated SOB especially with exertion and reports he has felt that way ever since he started dialysis a few months ago. Was seen by Neuro-10/21 outpatient , workup started with negative CT head, Neurology felt shaking episodes were suggestive of metabolic asterixis and myoclonic jerking, suspect polypharmacy with pain medication. MRI brain and EEG being planned as outpatient   In ER-afebrile and normotensive, LABS WITH hyperkalemia of 7.5, creatinine of 12.9 which is elevated from his baseline around 8-9.  Anion gap 16.  EKG showed a sinus rhythm with frequent PVCs and peaked T waves compared to prior.  s/p 1 g calcium gluconate, insulin with dextrose in the ED, refused Kayexalate Patient was dialyzed overnight . K today still up at 7.0 received 10 unit insulin IV, EKG done and nephrology notified for dialysis this afternoon. Patient reports he felt much improved this morning. Complains of some tongue swelling but no shortness of breath and reports "I feel good" no itching numbness no dysphagia no muffled voice,  Subjective: Alert awake oriented on room air sitting up on the bed Not in acute distress. Denies any complaint  Assessment & Plan:  Severe hyperkalemia potassium more than 7.4 on admission status post emergent dialysis this morning potassium is still received 2 minutes insulin daily unremarkable, and going for urgent dialysis this afternoon.  Patient has been educated on dietary discretion as he has been  eating food rich in potassium.  Hypertension volume overload up 5 kg by weight chest x-ray borderline no shortness of breath.  Continue ultrafiltration with HD today.  ESRD on HD MWF continue dialysis today until potassium normalizes can on schedule.  Shaking episodes seen by outpatient neurology  [id number] metabolic asterixis and myoclonic jerking.  I wonder if they are related to his potassium/electrolyte imbalance with ESRD. He will follow-up with outpatient neurology.  Chronic back pain on chronic opiate, Lyrica and Requip. Continue home dose.  Chronic diastolic CHF continue Coreg and Lasix.  Volume management in dialysis.  Insulin-dependent type 2 diabetes mellitus on lantus 30 units in a.m. and 15 units at night at home along with 8 units Humalog at mealtime.  Blood sugar poorly controlled 122-423, CONT Lantus 30 U AM AND 15 BEDTIME Cont, sliding scale insulin and monitor.  OSA cont cpap  Body mass index is 37.4 kg/m.   DVT prophylaxis: SCD/Heparin Code Status: full Family Communication: plan of care discussed with patient in detail.  Disposition Plan: Remains inpatient pending clinical improvement.  Consultants:Nephro   Procedures:HD  Microbiology: none  Antimicrobials: Anti-infectives (From admission, onward)   None       Objective: Vitals:   06/22/19 0500 06/22/19 0526 06/22/19 0626 06/22/19 0856  BP: 139/65 126/61 126/86 (!) 127/93  Pulse: 79 78 87 84  Resp:  12 18 18   Temp:  97.6 F (36.4 C) 98.2 F (36.8 C) 98.5 F (36.9 C)  TempSrc:  Axillary Oral Oral  SpO2:  100% 98% 96%  Weight:  125.1 kg      Intake/Output Summary (  Last 24 hours) at 06/22/2019 1443 Last data filed at 06/22/2019 0900 Gross per 24 hour  Intake 209.91 ml  Output 3064 ml  Net -2854.09 ml   Filed Weights   06/22/19 0128 06/22/19 0526  Weight: 127.6 kg 125.1 kg   Weight change:   Body mass index is 37.4 kg/m.  Intake/Output from previous day: 10/21 0701 - 10/22 0700  In: 109.9 [IV Piggyback:109.9] Out: 3064  Intake/Output this shift: Total I/O In: 100 [P.O.:100] Out: -   Examination:  General exam: Appears calm and comfortable, morbidly obese,  HEENT:PERRL,Oral mucosa moist, Ear/Nose normal on gross exam, ?  Tongue swelling able to visualize back of the throat and tip of the uvula. Respiratory system: Bilateral equal air entry, normal vesicular breath sounds, no wheezes or crackles  Cardiovascular system: S1 & S2 heard,No JVD, murmurs. Gastrointestinal system: Abdomen is  soft, non tender, non distended, BS +  Nervous System:Alert and oriented. No focal neurological deficits/moving extremities, sensation intact. Extremities: No edema, no clubbing, distal peripheral pulses palpable. Skin: No rashes, lesions, no icterus MSK: Normal muscle bulk,tone ,power  Medications:  Scheduled Meds: . atorvastatin  80 mg Oral q1800  . carvedilol  25 mg Oral BID WC  . Chlorhexidine Gluconate Cloth  6 each Topical Q0600  . Chlorhexidine Gluconate Cloth  6 each Topical Q0600  . Chlorhexidine Gluconate Cloth  6 each Topical Q0600  . cinacalcet  60 mg Oral Q breakfast  . ferric citrate  210 mg Oral TID WC  . furosemide  80 mg Oral Q T,Th,S,Su  . heparin      . heparin  5,000 Units Subcutaneous Q8H  . hydrALAZINE  100 mg Oral TID  . insulin aspart  0-15 Units Subcutaneous TID WC  . insulin glargine  15 Units Subcutaneous BID  . oxyCODONE  10 mg Oral QID  . pregabalin  100 mg Oral BID  . rOPINIRole  0.25 mg Oral QHS  . sodium chloride flush  3 mL Intravenous Once  . verapamil  240 mg Oral QHS   Continuous Infusions: . sodium chloride    . sodium chloride      Data Reviewed: I have personally reviewed following labs and imaging studies  CBC: Recent Labs  Lab 06/21/19 1747 06/22/19 0200  WBC 10.3 7.4  HGB 13.7 12.5*  HCT 44.0 39.6  MCV 97.3 96.4  PLT 145* [id number]*   Basic Metabolic Panel: Recent Labs  Lab 06/21/19 1747 06/22/19 0201 06/22/19  0733  NA 135 135 134*  K >7.5* 7.4* 7.0*  CL 89* 89* 92*  CO2 30 29 27   GLUCOSE 73 74 280*  BUN 84* 86* 50*  CREATININE 12.90* 13.47* 9.72*  CALCIUM 10.2 10.3 9.6  PHOS  --  8.3*  --    GFR: Estimated Creatinine Clearance: 12 mL/min (A) (by C-G formula based on SCr of 9.72 mg/dL (H)). Liver Function Tests: Recent Labs  Lab 06/22/19 0201  ALBUMIN 3.5   No results for input(s): LIPASE, AMYLASE in the last 168 hours. No results for input(s): AMMONIA in the last 168 hours. Coagulation Profile: No results for input(s): INR, PROTIME in the last 168 hours. Cardiac Enzymes: No results for input(s): CKTOTAL, CKMB, CKMBINDEX, TROPONINI in the last 168 hours. BNP (last 3 results) No results for input(s): PROBNP in the last 8760 hours. HbA1C: No results for input(s): HGBA1C in the last 72 hours. CBG: Recent Labs  Lab 06/22/19 0535 06/22/19 0627 06/22/19 1047 06/22/19 1148  GLUCAP 122* 142* 433*  339*   Lipid Profile: No results for input(s): CHOL, HDL, LDLCALC, TRIG, CHOLHDL, LDLDIRECT in the last 72 hours. Thyroid Function Tests: No results for input(s): TSH, T4TOTAL, FREET4, T3FREE, THYROIDAB in the last 72 hours. Anemia Panel: No results for input(s): VITAMINB12, FOLATE, FERRITIN, TIBC, IRON, RETICCTPCT in the last 72 hours. Sepsis Labs: No results for input(s): PROCALCITON, LATICACIDVEN in the last 168 hours.  Recent Results (from the past 240 hour(s))  SARS Coronavirus 2 by RT PCR (hospital order, performed in [facility] hospital lab) Nasopharyngeal Nasopharyngeal Swab     Status: None   Collection Time: 06/21/19  9:28 PM   Specimen: Nasopharyngeal Swab  Result Value Ref Range Status   SARS Coronavirus 2 NEGATIVE NEGATIVE Final    Comment: (NOTE) If result is NEGATIVE SARS-CoV-2 target nucleic acids are NOT DETECTED. The SARS-CoV-2 RNA is generally detectable in upper and lower  respiratory specimens during the acute phase of infection. The lowest  concentration of  SARS-CoV-2 viral copies this assay can detect is 250  copies / mL. A negative result does not preclude SARS-CoV-2 infection  and should not be used as the sole basis for treatment or other  patient management decisions.  A negative result may occur with  improper specimen collection / handling, submission of specimen other  than nasopharyngeal swab, presence of viral mutation(s) within the  areas targeted by this assay, and inadequate number of viral copies  (<250 copies / mL). A negative result must be combined with clinical  observations, patient history, and epidemiological information. If result is POSITIVE SARS-CoV-2 target nucleic acids are DETECTED. The SARS-CoV-2 RNA is generally detectable in upper and lower  respiratory specimens dur ing the acute phase of infection.  Positive  results are indicative of active infection with SARS-CoV-2.  Clinical  correlation with patient history and other diagnostic information is  necessary to determine patient infection status.  Positive results do  not rule out bacterial infection or co-infection with other viruses. If result is PRESUMPTIVE POSTIVE SARS-CoV-2 nucleic acids MAY BE PRESENT.   A presumptive positive result was obtained on the submitted specimen  and confirmed on repeat testing.  While 2019 novel coronavirus  (SARS-CoV-2) nucleic acids may be present in the submitted sample  additional confirmatory testing may be necessary for epidemiological  and / or clinical management purposes  to differentiate between  SARS-CoV-2 and other Sarbecovirus currently known to infect humans.  If clinically indicated additional testing with an alternate test  methodology [phone]) is advised. The SARS-CoV-2 RNA is generally  detectable in upper and lower respiratory sp ecimens during the acute  phase of infection. The expected result is Negative. Fact Sheet for Patients:  StrictlyIdeas.no Fact Sheet for Healthcare  Providers: BankingDealers.co.za This test is not yet approved or cleared by the [place] FDA and has been authorized for detection and/or diagnosis of SARS-CoV-2 by FDA under an Emergency Use Authorization (EUA).  This EUA will remain in effect (meaning this test can be used) for the duration of the COVID-19 declaration under Section 564(b)(1) of the Act, 21 U.S.C. section 360bbb-3(b)(1), unless the authorization is terminated or revoked sooner. Performed at [facility], [place] [address], [place], [place] [address]       Radiology Studies: Dg Chest Portable 1 View  Result Date: 06/21/2019 CLINICAL DATA:  Shortness of breath. Generalized weakness. Dialysis patient. EXAM: PORTABLE CHEST 1 VIEW COMPARISON:  02/24/2019 FINDINGS: Artifact overlies the chest. There is cardiomegaly with left ventricular prominence. Mediastinal shadows  are normal. There is mild venous hypertension but there is no interstitial edema or visible pleural effusion. No sign of consolidation or collapse. Left costophrenic angle excluded from the film. IMPRESSION: Cardiomegaly and mild venous hypertension. No frank edema. No consolidation or collapse. Electronically Signed   By: Nelson Chimes M.D.   On: 06/21/2019 21:48      LOS: 1 day   Time spent: More than 50% of that time was spent in counseling and/or coordination of care.  Antonieta Pert, MD [facility]  06/22/2019, 2:43 PM

## 2019-06-22 NOTE — Progress Notes (Addendum)
CRITICAL VALUE ALERT  Critical Value:  K+ 6.5  Date & Time Notied:  06/22/2019 @ 2148  Provider Notified: X. Blount, NP  Orders Received/Actions taken: Give Kayexalate

## 2019-06-22 NOTE — Telephone Encounter (Signed)
[employer] medicare/medicaid order sent to GI. No auth they will reach out to the patient to schedule.  °

## 2019-06-22 NOTE — Consult Note (Signed)
Renal Service Consult Note [facility]  Jerry Cantrell 06/22/2019 Jerry Cantrell Requesting Physician:  Dr Jerry Cantrell  Reason for Consult:  ESRD pt /w severe hyperkalemia HPI: The patient is a 53 y.o. year-old w hx of DM2, HTN and ESRD started HD in June 2020, presented to ED last night for 3-4 wks of episodic arm and leg weakness.  In ED K+ was > 7.5.  Pt was admitted and got HD overnight.  This am pt feeling alittle better but still weak.  K 7.0 this am. Repeat EKG showing QRS 110 msec, IVCD, no peaked T's.    Pt has mult c/o's but mainly muscle weakness arms and legs, episodically severe causing falls at times, this has been going on for 3-4 wks off and on.  Only new diet change is patient states has been eating "a lot" of nectarines and peaches (283 mg each per fruit) in the last few weeks. Also notes has been "craving" green tomatoes fried (203 mg/ tomato), but this is not new.  Otherwise cooks all his own food, no tomato sauce, does have potatoes but they soak them overnight sometimes. occ OJ for low BS.      ROS  denies CP  no joint pain   no HA  no blurry vision  no rash  no diarrhea  no nausea/ vomiting    Past Medical History  Past Medical History:  Diagnosis Date  . Acute diastolic heart failure ([place])   . CHF (congestive heart failure) ([place])   . Diabetes mellitus with nephropathy ([place]) 05/12/2012   Dr. Justin Cantrell follows - dialysis three times weekly  . Hemorrhoids 05/12/2012  . Hyperlipidemia   . Hypertension   . Lumbar spinal stenosis 05/03/2012   Mild with only right L4 nerve root encroachment, no neurogenic claudication   . Lumbar spondylosis 05/03/2012  . Meralgia paraesthetica 05/03/2012  . Meralgia paraesthetica 05/03/2012   On Lyrica which does improve pain.   Marland Kitchen Neuropathy in diabetes ([place]) 05/12/2012  . Obesity   . Pneumonia   . Retinopathy   . Sleep apnea    uses cpap  . Substance abuse ([place])   . Urinary incontinence 05/12/2012   Since the start of  Sept. 2013    Past Surgical History  Past Surgical History:  Procedure Laterality Date  . ABDOMINAL SURGERY     Abscess I&D 2/2 infected hair  . AV FISTULA PLACEMENT Left 02/06/2016   Procedure: LEFT ARM RADIOCEPHALIC ARTERIOVENOUS (AV) FISTULA CREATION;  Surgeon: Jerry Posner, Cantrell;  Location: [facility];  Service: Vascular;  Laterality: Left;  . COLONOSCOPY W/ POLYPECTOMY     pt to bring records  . COLONOSCOPY WITH PROPOFOL N/A 06/05/2016   Procedure: COLONOSCOPY WITH PROPOFOL;  Surgeon: Jerry Stabler, Cantrell;  Location: [facility];  Service: Gastroenterology;  Laterality: N/A;  . TONSILLECTOMY     Family History  Family History  Problem Relation Age of Onset  . Prostate cancer Father   . Alcohol abuse Father   . Emphysema Father        smoked  . Memory loss Mother   . Diabetes Sister   . Colon cancer Neg Hx   . Colon polyps Neg Hx   . Stomach cancer Neg Hx   . Rectal cancer Neg Hx    Social History  reports that he has been smoking cigarettes. He has a 15.00 pack-year smoking history. He has never used smokeless tobacco. He reports that he does not drink alcohol  or use drugs. Allergies No Known Allergies Home medications Prior to Admission medications   Medication Sig Start Date End Date Taking? Authorizing Provider  ACCU-CHEK AVIVA PLUS test strip USE ONE STRIP 5 (FIVE) TIMES DAILY AS INSTRUCTED 04/24/19  Yes Jerry Chimes A, Cantrell  Accu-Chek Softclix Lancets lancets Use as instructed 11/17/18  Yes Jerry Cantrell  atorvastatin (LIPITOR) 80 MG tablet Take 1 tablet (80 mg total) by mouth daily at 6 PM. 11/17/18  Yes Jerry Cantrell  AURYXIA 1 GM 210 MG(Fe) tablet Take 210 mg by mouth 3 (three) times daily with meals.  05/15/19  Yes Provider, Historical, Cantrell  bacitracin-polymyxin b (POLYSPORIN) ointment Apply topically 2 (two) times daily. 03/20/19  Yes Jerry Chimes A, Cantrell  carvedilol (COREG) 25 MG tablet Take 25 mg by mouth 2 (two) times daily with a meal.   Yes Provider,  Historical, Cantrell  cetirizine (ZYRTEC) 10 MG tablet TAKE ONE TABLET BY MOUTH ONCE DAILY FOR ALLERGIES Patient taking differently: Take 10 mg by mouth daily.  12/09/18  Yes Jerry Moron, Cantrell  cinacalcet (SENSIPAR) 60 MG tablet Take 60 mg by mouth daily. 01/05/19  Yes Provider, Historical, Cantrell  clobetasol cream (TEMOVATE) 0.05 % APPLY TO AFFECED ARES(S) TOPICALLY TWICE DAILY Patient taking differently: Apply 1 application topically 2 (two) times daily.  04/18/19  Yes Jerry Moron, Cantrell  Continuous Blood Gluc Receiver (DEXCOM G6 RECEIVER) DEVI 1 Device by Does not apply route as directed. 06/21/19  Yes Jerry Shin, Cantrell  Continuous Blood Gluc Sensor (DEXCOM G6 SENSOR) MISC 1 Device by Does not apply route as directed. 06/21/19  Yes Jerry Shin, Cantrell  Continuous Blood Gluc Transmit (DEXCOM G6 TRANSMITTER) MISC 1 Device by Does not apply route as directed. 06/21/19  Yes Jerry Shin, Cantrell  fluconazole (DIFLUCAN) 100 MG tablet Take 200 mg by mouth once a week.  01/09/19  Yes Provider, Historical, Cantrell  fluticasone (FLONASE) 50 MCG/ACT nasal spray USE TWO SPRAYS INTO EACH NOSTRILS DAILY Patient taking differently: Place 2 sprays into both nostrils daily. USE TWO SPRAYS INTO EACH NOSTRILS DAILY 06/01/19  Yes Jerry Cantrell  folic acid-vitamin b complex-vitamin c-selenium-zinc (DIALYVITE) 3 MG TABS tablet Take 1 tablet by mouth daily.   Yes Provider, Historical, Cantrell  furosemide (LASIX) 80 MG tablet Take 1 tablet (80 mg total) by mouth every Tuesday, Thursday, Saturday, and Sunday. 02/25/19  Yes Jerry Aloe, Cantrell  hydrALAZINE (APRESOLINE) 100 MG tablet TAKE 1 TABLET (100 MG TOTAL) BY MOUTH 3 (THREE) TIMES DAILY. 06/20/19  Yes Jerry Cantrell  Insulin Glargine (LANTUS SOLOSTAR) 100 UNIT/ML Solostar Pen Inject 40 Units into the skin daily. INJECT 30 UNITS UNDER THE SKIN EVERY MORNING AND INJECT 15 UNITS EVERY NIGHT Patient taking differently: Inject 40 Units into the skin daily.  06/15/19  Yes Jerry Cantrell,  Jerry Cantrell Crazier, Cantrell  insulin lispro (HUMALOG KWIKPEN) 100 UNIT/ML KwikPen Inject 0.08 mLs (8 Units total) into the skin 3 (three) times daily. Max daily dose of 100 units daily Patient taking differently: Inject 10 Units into the skin 3 (three) times daily.  06/07/19  Yes Jerry Cantrell, Jerry Cantrell Crazier, Cantrell  Insulin Pen Needle (B-D UF III MINI PEN NEEDLES) 31G X 5 MM MISC Four times daily 11/17/18  Yes Jerry Cantrell  lidocaine-prilocaine (EMLA) cream Apply 1 application topically See admin instructions. Apply 1 application topical on Monday Wednesday and fridays 02/28/19  Yes Provider, Historical, Cantrell  Multiple Vitamin (MULTIVITAMIN WITH MINERALS) TABS tablet Take  1 tablet by mouth daily.   Yes Provider, Historical, Cantrell  NARCAN 4 MG/0.1ML LIQD nasal spray kit Place 2 mLs into the nose See admin instructions. May repeat dose every 2 to 3 minutes until responsive or ems arrives. 01/04/19  Yes Provider, Historical, Cantrell  Nicotine 21-14-7 MG/24HR KIT Place 1 patch onto the skin daily. 01/24/19  Yes Parrett, Tammy S, NP  Olopatadine HCl 0.2 % SOLN APPLY 1 DROP TO EYE DAILY. Patient taking differently: Place 1 drop into both eyes daily.  03/29/19  Yes Jerry Cantrell  ondansetron (ZOFRAN) 4 MG tablet Take 1 tablet (4 mg total) by mouth every 8 (eight) hours as needed for nausea or vomiting. 06/13/19  Yes Jerry Cantrell  Oxycodone HCl 10 MG TABS Take 10 mg by mouth 4 (four) times daily.  02/01/19  Yes Provider, Historical, Cantrell  pregabalin (LYRICA) 100 MG capsule Take 100 mg by mouth 2 (two) times daily.  03/03/19  Yes Provider, Historical, Cantrell  rOPINIRole (REQUIP) 0.25 MG tablet Take 0.25 mg by mouth at bedtime. 05/15/19  Yes Provider, Historical, Cantrell  verapamil (CALAN-SR) 240 MG CR tablet Take 1 tablet (240 mg total) by mouth at bedtime. 03/07/19  Yes Richardson Dopp T, PA-C   Liver Function Tests Recent Labs  Lab 06/22/19 0201  ALBUMIN 3.5   No results for input(s): LIPASE, AMYLASE in the last 168  hours. CBC Recent Labs  Lab 06/21/19 1747 06/22/19 0200  WBC 10.3 7.4  HGB 13.7 12.5*  HCT 44.0 39.6  MCV 97.3 96.4  PLT 145* 248*   Basic Metabolic Panel Recent Labs  Lab 06/21/19 1747 06/22/19 0201 06/22/19 0733  NA 135 135 134*  K >7.5* 7.4* 7.0*  CL 89* 89* 92*  CO2 _0 GLUCOSE 73 74 280*  BUN 84* 86* 50*  CREATININE 12.90* 13.47* 9.72*  CALCIUM 10.2 10.3 9.6  PHOS  --  8.3*  --    Iron/TIBC/Ferritin/ %Sat No results found for: IRON, TIBC, FERRITIN, IRONPCTSAT  Vitals:   06/22/19 0500 06/22/19 0526 06/22/19 0626 06/22/19 0856  BP: 139/65 126/61 126/86 (!) 127/93  Pulse: 79 78 87 84  Resp:  _1 Temp:  97.6 F (36.4 C) 98.2 F (36.8 C) 98.5 F (36.9 C)  TempSrc:  Axillary Oral Oral  SpO2:  100% 98% 96%  Weight:  125.1 kg      Exam Gen alert, no distress, calm No rash, cyanosis or gangrene Sclera anicteric, throat clear , tongue slightly large  No jvd or bruits Chest clear bilat to bases, no rales RRR no MRG Abd soft ntnd no mass or ascites +bs obese GU normal male MS no joint effusions or deformity Ext no LE or UE edema, no wounds or ulcers Neuro is alert, Ox 3 , nf, a little bit tremulous, sitting up on side of bed, speech a little slurred LFA AVF +bruit   Home meds:  - verapamil 240 cr qd/ hydralazine 100 tid/ furosemide 80 tts/ carvedilol 25 bid  - atorvastatin 80  - insulin glargine 40/ lispro 10 tid  - ropinirole 0.25 hs/ pregabalin 100 bid  - auryxia/ cinacalcet 60 qd    Outpt HD: [facility]  4.5h  400/800   120kg   2/2 bath  Hep 2600 LFA AVF  - no esa, no Fe    Assessment/ Plan: 1. Severe hyperkalemia - will need repeat HD this afternoon for ^^K+/ Discussed in detail dietary changes and the contribution  to his current condition.  2. HTN/ vol - up 5kg by wts, CXR borderline, cont UF w/ HD this afternoon 3. ESRD - HD MWF.  If K+ normalizes can be dc'd tomorrow for OP HD in the afternoon.  4. HTN - cont meds 5.  DM2 - per  primary      Kelly Splinter  Cantrell 06/22/2019, 11:39 AM

## 2019-06-22 NOTE — ED Notes (Signed)
Second RN had two unsuccessful IV attempts. Placing an IV consult.

## 2019-06-22 NOTE — Progress Notes (Signed)
Critical Value: K+ is 7.0, hospitalist and nephro notified

## 2019-06-22 NOTE — ED Notes (Signed)
No answer in HD. CN notified.

## 2019-06-22 NOTE — ED Notes (Signed)
Called Hemodialysis several times with no answer.  I called AC with no answer.  Called the operator [facility]) and she was able to call the supervisor on call and got in touch with someone.  She advised they know about this patient and she will be down to do bedside dialysis--Jerry Cantrell

## 2019-06-22 NOTE — ED Notes (Signed)
IV team at bedside 

## 2019-06-22 NOTE — ED Notes (Signed)
Pt IV failed. Attempted another line and it blew. Asking another RN to try.

## 2019-06-22 NOTE — ED Notes (Signed)
Secretary attempting to reach HD.

## 2019-06-22 NOTE — Progress Notes (Signed)
BIPAP/CPAP order put in. PT was asked about wearing at home, PT states he does use at home but did not want to use one tonight. I spoke with him for a minute about wearing one and he said he was sure he did not want it tonight. PT was instructed to call if he changed his mind.

## 2019-06-22 NOTE — ED Notes (Signed)
Calcium gluconate restarted as IV team placed new line.

## 2019-06-23 DIAGNOSIS — Z992 Dependence on renal dialysis: Secondary | ICD-10-CM

## 2019-06-23 LAB — CBC
HCT: 40.4 % (ref 39.0–52.0)
Hemoglobin: 12.8 g/dL — ABNORMAL LOW (ref 13.0–17.0)
MCH: 30.1 pg (ref 26.0–34.0)
MCHC: 31.7 g/dL (ref 30.0–36.0)
MCV: 95.1 fL (ref 80.0–100.0)
Platelets: 104 10*3/uL — ABNORMAL LOW (ref 150–400)
RBC: 4.25 MIL/uL (ref 4.22–5.81)
RDW: 14.2 % (ref 11.5–15.5)
WBC: 7.5 10*3/uL (ref 4.0–10.5)
nRBC: 0 % (ref 0.0–0.2)

## 2019-06-23 LAB — BASIC METABOLIC PANEL
Anion gap: 15 (ref 5–15)
BUN: 44 mg/dL — ABNORMAL HIGH (ref 6–20)
CO2: 26 mmol/L (ref 22–32)
Calcium: 10.4 mg/dL — ABNORMAL HIGH (ref 8.9–10.3)
Chloride: 89 mmol/L — ABNORMAL LOW (ref 98–111)
Creatinine, Ser: 9.5 mg/dL — ABNORMAL HIGH (ref 0.61–1.24)
GFR calc Af Amer: 7 mL/min — ABNORMAL LOW (ref >60)
GFR calc non Af Amer: 6 mL/min — ABNORMAL LOW (ref >60)
Glucose, Bld: 250 mg/dL — ABNORMAL HIGH (ref 70–99)
Potassium: 6.4 mmol/L (ref 3.5–5.1)
Sodium: 130 mmol/L — ABNORMAL LOW (ref 135–145)

## 2019-06-23 LAB — COMPREHENSIVE METABOLIC PANEL
ALT: 25 U/L (ref 0–44)
AST: 20 U/L (ref 15–41)
Albumin: 3.4 g/dL — ABNORMAL LOW (ref 3.5–5.0)
Alkaline Phosphatase: 111 U/L (ref 38–126)
Anion gap: 16 — ABNORMAL HIGH (ref 5–15)
BUN: 41 mg/dL — ABNORMAL HIGH (ref 6–20)
CO2: 25 mmol/L (ref 22–32)
Calcium: 10.3 mg/dL (ref 8.9–10.3)
Chloride: 90 mmol/L — ABNORMAL LOW (ref 98–111)
Creatinine, Ser: 8.61 mg/dL — ABNORMAL HIGH (ref 0.61–1.24)
GFR calc Af Amer: 7 mL/min — ABNORMAL LOW (ref >60)
GFR calc non Af Amer: 6 mL/min — ABNORMAL LOW (ref >60)
Glucose, Bld: 392 mg/dL — ABNORMAL HIGH (ref 70–99)
Potassium: 5.3 mmol/L — ABNORMAL HIGH (ref 3.5–5.1)
Sodium: 131 mmol/L — ABNORMAL LOW (ref 135–145)
Total Bilirubin: 0.6 mg/dL (ref 0.3–1.2)
Total Protein: 6.2 g/dL — ABNORMAL LOW (ref 6.5–8.1)

## 2019-06-23 LAB — GLUCOSE, CAPILLARY
Glucose-Capillary: 126 mg/dL — ABNORMAL HIGH (ref 70–99)
Glucose-Capillary: 223 mg/dL — ABNORMAL HIGH (ref 70–99)
Glucose-Capillary: 498 mg/dL — ABNORMAL HIGH (ref 70–99)
Glucose-Capillary: 571 mg/dL (ref 70–99)
Glucose-Capillary: 292 mg/dL — ABNORMAL HIGH (ref 70–99)

## 2019-06-23 MED ORDER — SODIUM ZIRCONIUM CYCLOSILICATE 5 G PO PACK
5.0000 g | PACK | Freq: Once | ORAL | Status: DC
  Filled 2019-06-23 (×2): qty 1

## 2019-06-23 MED ORDER — TRAMADOL HCL 50 MG PO TABS: 50.0000 mg | ORAL_TABLET | Freq: Once | ORAL | Status: DC

## 2019-06-23 MED ORDER — INSULIN ASPART 100 UNIT/ML ~~LOC~~ SOLN: 16.0000 [IU] | Freq: Once | SUBCUTANEOUS | Status: AC

## 2019-06-23 MED ORDER — HEPARIN SODIUM (PORCINE) 1000 UNIT/ML IJ SOLN
INTRAMUSCULAR | Status: AC
  Filled 2019-06-23: qty 3

## 2019-06-23 MED ORDER — CHLORHEXIDINE GLUCONATE CLOTH 2 % EX PADS
6.0000 | MEDICATED_PAD | Freq: Every day | CUTANEOUS | Status: DC
  Administered 2019-06-23 – 2019-06-24 (×2): 6 via TOPICAL

## 2019-06-23 NOTE — Progress Notes (Signed)
[facility] Progress Note  Subjective: no new c/o's. 123kg this am standing.   Vitals:   06/22/19 1530 06/22/19 1615 06/22/19 2129 06/23/19 0502  BP: 103/65 112/74 124/75 122/83  Pulse: 86 85 80 80  Resp: 18  16 18   Temp: 99.4 F (37.4 C) 98.3 F (36.8 C) 98.4 F (36.9 C) 98 F (36.7 C)  TempSrc: Oral Oral Oral Oral  SpO2: 97% 95% 95% 96%  Weight:        Inpatient medications: . atorvastatin  80 mg Oral q1800  . carvedilol  25 mg Oral BID WC  . Chlorhexidine Gluconate Cloth  6 each Topical Q0600  . cinacalcet  60 mg Oral Q breakfast  . ferric citrate  210 mg Oral TID WC  . furosemide  80 mg Oral Q T,Th,S,Su  . heparin  5,000 Units Subcutaneous Q8H  . hydrALAZINE  100 mg Oral TID  . insulin aspart  0-15 Units Subcutaneous TID WC  . insulin aspart  8 Units Subcutaneous TID WC  . insulin glargine  15 Units Subcutaneous QHS  . insulin glargine  30 Units Subcutaneous Daily  . oxyCODONE  10 mg Oral QID  . pregabalin  100 mg Oral BID  . rOPINIRole  0.25 mg Oral QHS  . sodium chloride flush  3 mL Intravenous Once  . sodium zirconium cyclosilicate  5 g Oral Once  . traMADol  50 mg Oral Once  . verapamil  240 mg Oral QHS   . sodium chloride    . sodium chloride     sodium chloride, sodium chloride, alteplase, heparin, lidocaine (PF), lidocaine-prilocaine, pentafluoroprop-tetrafluoroeth    Exam: Gen alert, no distress, calm No jvd or bruits Chest clear bilat to bases RRR no MRG Abd soft ntnd no mass or ascites +bs obese Ext no edema Neuro is alert, Ox 3 , nf LFA AVF +bruit   Home meds:  - verapamil 240 cr qd/ hydralazine 100 tid/ furosemide 80 tts/ carvedilol 25 bid  - atorvastatin 80  - insulin glargine 40/ lispro 10 tid  - ropinirole 0.25 hs/ pregabalin 100 bid  - auryxia/ cinacalcet 60 qd    Outpt HD: [facility]  4.5h  400/800   120kg   2/2 bath  Hep 2600 LFA AVF  - no esa, no Fe    Assessment/ Plan: 1. Severe hyperkalemia -  persists, pt was not able to complete 2nd HD yesterday. Plan HD this afternoon.  Discussed in detail dietary issues yesterday. If K+ normalized can go home in am.  2. HTN/ vol - up 3kg today 3. ESRD - HD MWF.  HD today on schedule.  4. HTN/ volume  - up 3kg no SOB, lungs clear 5.  DM2 - per primary     Rob Abimbola Aki 06/23/2019, 8:53 AM  Iron/TIBC/Ferritin/ %Sat No results found for: IRON, TIBC, FERRITIN, IRONPCTSAT Recent Labs  Lab 06/22/19 0201  06/23/19 0604  NA 135   < > 130*  K 7.4*   < > 6.4*  CL 89*   < > 89*  CO2 29   < > 26  GLUCOSE 74   < > 250*  BUN 86*   < > 44*  CREATININE 13.47*   < > 9.50*  CALCIUM 10.3   < > 10.4*  PHOS 8.3*  --   --   ALBUMIN 3.5  --   --    < > = values in this interval not displayed.   No results for input(s): AST,  ALT, ALKPHOS, BILITOT, PROT in the last 168 hours. Recent Labs  Lab 06/23/19 0604  WBC 7.5  HGB 12.8*  HCT 40.4  PLT 104*

## 2019-06-23 NOTE — Progress Notes (Signed)
PROGRESS NOTE    Jerry Cantrell  [id number] DOB: [birth year] DOA: 06/21/2019 PCP: Forrest Moron, MD   Brief Narrative: 53 year old male ESRD on HD MWF diastolic CHF IDDM type II OSA on BiPAP, HTN presents with dyspnea "nervous system shutting down".Per HPI "he could not even hold his head up or ambulate.he would occasionally start shaking  from his shoulders and down to his lower extremity, no LOC. Episodes happening few times/month but now it is about 2-3 times a day. He had associated SOB especially with exertion and reports he has felt that way ever since he started dialysis a few months ago. Was seen by Neuro-10/21 outpatient , workup started with negative CT head, Neurology felt shaking episodes were suggestive of metabolic asterixis and myoclonic jerking, suspect polypharmacy with pain medication. MRI brain and EEG being planned as outpatient   In ER-afebrile and normotensive, LABS WITH hyperkalemia of 7.5, creatinine of 12.9 which is elevated from his baseline around 8-9.  Anion gap 16.  EKG showed a sinus rhythm with frequent PVCs and peaked T waves compared to prior.  s/p 1 g calcium gluconate, insulin with dextrose in the ED, refused Kayexalate S/p HD x2 10/22 k still up  Subjective: reprots he has baseline "growling" in his voice, does not endorse discomfort in tongue but appears thick/?swollen- same like yesterday states "jerks are gone" k still high, no acute events overnight  Assessment & Plan:  Severe hyperkalemia potassium more than 7.4 on admission status post emergent and still  7.4 psot HD and went for hemodialysis and had HD again 10/22, k this am still high at 6.4- s/p lokelma and for HD, spoke with nephrology. Patient has been educated on dietary discretion as he has been eating food rich in potassium.  Hypertension volume overload:chest x-ray borderline no shortness of breath.  Continue ultrafiltration with HD  ESRD on HD MWF continue dialysis today  then on  schedule  Shaking episodes seen by outpatient neurology  [id number] metabolic asterixis and myoclonic jerking.  I wonder if they are related to his potassium/electrolyte imbalance with ESRD. He will follow-up with outpatient neurology.  He reports jerks are better.Reports his potassium is always been running on higher side.  Chronic back pain on chronic opiate, Lyrica and Requip. Continue home dose.  Hold for sedation drowsiness  Chronic diastolic CHF continue Coreg and Lasix.  Volume management in dialysis.  Insulin-dependent type 2 diabetes mellitus on lantus 30 units in a.m. and 15 units at night at home along with 8 units Humalog at mealtime.  Blood sugar fairly stable one one 112-250 CONT Lantus 30 U AM AND 15 BEDTIME,  8 unites premeal and ssi  OSA cont cpap-not wearing here  Body mass index is 38.66 kg/m.   DVT prophylaxis: SCD/Heparin Code Status: full Family Communication: plan of care discussed with patient in detail.  Disposition Plan: Remains inpatient pending improvement in potassium, electrolyte imbalance.  anticipate  discharge in the morning. BMP in am.  Consultants:Nephro   Procedures:HD  Microbiology: none  Antimicrobials: Anti-infectives (From admission, onward)   None       Objective: Vitals:   06/22/19 1530 06/22/19 1615 06/22/19 2129 06/23/19 0502  BP: 103/65 112/74 124/75 122/83  Pulse: 86 85 80 80  Resp: 18  16 18   Temp: 99.4 F (37.4 C) 98.3 F (36.8 C) 98.4 F (36.9 C) 98 F (36.7 C)  TempSrc: Oral Oral Oral Oral  SpO2: 97% 95% 95% 96%  Weight:  Intake/Output Summary (Last 24 hours) at 06/23/2019 0900 Last data filed at 06/23/2019 0201 Gross per 24 hour  Intake 480 ml  Output 1316 ml  Net -836 ml   Filed Weights   06/22/19 0128 06/22/19 0526 06/22/19 1235  Weight: 127.6 kg 125.1 kg 129.3 kg   Weight change: 1.7 kg  Body mass index is 38.66 kg/m.  Intake/Output from previous day: 10/22 0701 - 10/23 0700 In: 580  [P.O.:580] Out: 1316  Intake/Output this shift: No intake/output data recorded.  Examination:  General exam: AAO,NAD, obese,  HEENT:Oral mucosa moist, Ear/Nose WNL grossly, dentition normal.  Tongue appears thick but able to visualize the uvula, no dysphagia or trouble breathing Respiratory system: Diminished at the base,no wheezing or crackles, NT,no use of accessory muscle Cardiovascular system: S1 & S2 +, No JVD, regular RR. Gastrointestinal system: Abdomen soft, NT,ND, BS+ Nervous System:Alert, awake, moving extremities and grossly nonfocal Extremities: No edema, distal peripheral pulses palpable.  Skin: No rashes,no icterus. MSK: Normal muscle bulk,tone, power  Medications:  Scheduled Meds: . atorvastatin  80 mg Oral q1800  . carvedilol  25 mg Oral BID WC  . Chlorhexidine Gluconate Cloth  6 each Topical Q0600  . Chlorhexidine Gluconate Cloth  6 each Topical Q0600  . cinacalcet  60 mg Oral Q breakfast  . ferric citrate  210 mg Oral TID WC  . furosemide  80 mg Oral Q T,Th,S,Su  . heparin  5,000 Units Subcutaneous Q8H  . hydrALAZINE  100 mg Oral TID  . insulin aspart  0-15 Units Subcutaneous TID WC  . insulin aspart  8 Units Subcutaneous TID WC  . insulin glargine  15 Units Subcutaneous QHS  . insulin glargine  30 Units Subcutaneous Daily  . oxyCODONE  10 mg Oral QID  . pregabalin  100 mg Oral BID  . rOPINIRole  0.25 mg Oral QHS  . sodium chloride flush  3 mL Intravenous Once  . sodium zirconium cyclosilicate  5 g Oral Once  . traMADol  50 mg Oral Once  . verapamil  240 mg Oral QHS   Continuous Infusions: . sodium chloride    . sodium chloride      Data Reviewed: I have personally reviewed following labs and imaging studies  CBC: Recent Labs  Lab 06/21/19 1747 06/22/19 0200 06/23/19 0604  WBC 10.3 7.4 7.5  HGB 13.7 12.5* 12.8*  HCT 44.0 39.6 40.4  MCV 97.3 96.4 95.1  PLT 145* 119* [id number]*   Basic Metabolic Panel: Recent Labs  Lab 06/21/19 1747 06/22/19  0201 06/22/19 0733 06/22/19 2039 06/23/19 0604  NA 135 135 134* 133* 130*  K >7.5* 7.4* 7.0* 6.5* 6.4*  CL 89* 89* 92* 91* 89*  CO2 30 29 27 29 26   GLUCOSE 73 74 280* 143* 250*  BUN 84* 86* 50* 38* 44*  CREATININE 12.90* 13.47* 9.72* 8.32* 9.50*  CALCIUM 10.2 10.3 9.6 10.4* 10.4*  PHOS  --  8.3*  --   --   --    GFR: Estimated Creatinine Clearance: 12.5 mL/min (A) (by C-G formula based on SCr of 9.5 mg/dL (H)). Liver Function Tests: Recent Labs  Lab 06/22/19 0201  ALBUMIN 3.5   No results for input(s): LIPASE, AMYLASE in the last 168 hours. No results for input(s): AMMONIA in the last 168 hours. Coagulation Profile: No results for input(s): INR, PROTIME in the last 168 hours. Cardiac Enzymes: No results for input(s): CKTOTAL, CKMB, CKMBINDEX, TROPONINI in the last 168 hours. BNP (last 3 results) No  results for input(s): PROBNP in the last 8760 hours. HbA1C: No results for input(s): HGBA1C in the last 72 hours. CBG: Recent Labs  Lab 06/22/19 1148 06/22/19 1641 06/22/19 2135 06/23/19 0111 06/23/19 0653  GLUCAP 339* 117* 112* 126* 223*   Lipid Profile: No results for input(s): CHOL, HDL, LDLCALC, TRIG, CHOLHDL, LDLDIRECT in the last 72 hours. Thyroid Function Tests: No results for input(s): TSH, T4TOTAL, FREET4, T3FREE, THYROIDAB in the last 72 hours. Anemia Panel: No results for input(s): VITAMINB12, FOLATE, FERRITIN, TIBC, IRON, RETICCTPCT in the last 72 hours. Sepsis Labs: No results for input(s): PROCALCITON, LATICACIDVEN in the last 168 hours.  Recent Results (from the past 240 hour(s))  SARS Coronavirus 2 by RT PCR (hospital order, performed in [facility] hospital lab) Nasopharyngeal Nasopharyngeal Swab     Status: None   Collection Time: 06/21/19  9:28 PM   Specimen: Nasopharyngeal Swab  Result Value Ref Range Status   SARS Coronavirus 2 NEGATIVE NEGATIVE Final    Comment: (NOTE) If result is NEGATIVE SARS-CoV-2 target nucleic acids are NOT DETECTED.  The SARS-CoV-2 RNA is generally detectable in upper and lower  respiratory specimens during the acute phase of infection. The lowest  concentration of SARS-CoV-2 viral copies this assay can detect is 250  copies / mL. A negative result does not preclude SARS-CoV-2 infection  and should not be used as the sole basis for treatment or other  patient management decisions.  A negative result may occur with  improper specimen collection / handling, submission of specimen other  than nasopharyngeal swab, presence of viral mutation(s) within the  areas targeted by this assay, and inadequate number of viral copies  (<250 copies / mL). A negative result must be combined with clinical  observations, patient history, and epidemiological information. If result is POSITIVE SARS-CoV-2 target nucleic acids are DETECTED. The SARS-CoV-2 RNA is generally detectable in upper and lower  respiratory specimens dur ing the acute phase of infection.  Positive  results are indicative of active infection with SARS-CoV-2.  Clinical  correlation with patient history and other diagnostic information is  necessary to determine patient infection status.  Positive results do  not rule out bacterial infection or co-infection with other viruses. If result is PRESUMPTIVE POSTIVE SARS-CoV-2 nucleic acids MAY BE PRESENT.   A presumptive positive result was obtained on the submitted specimen  and confirmed on repeat testing.  While 2019 novel coronavirus  (SARS-CoV-2) nucleic acids may be present in the submitted sample  additional confirmatory testing may be necessary for epidemiological  and / or clinical management purposes  to differentiate between  SARS-CoV-2 and other Sarbecovirus currently known to infect humans.  If clinically indicated additional testing with an alternate test  methodology [phone]) is advised. The SARS-CoV-2 RNA is generally  detectable in upper and lower respiratory sp ecimens during the acute   phase of infection. The expected result is Negative. Fact Sheet for Patients:  StrictlyIdeas.no Fact Sheet for Healthcare Providers: BankingDealers.co.za This test is not yet approved or cleared by the [place] FDA and has been authorized for detection and/or diagnosis of SARS-CoV-2 by FDA under an Emergency Use Authorization (EUA).  This EUA will remain in effect (meaning this test can be used) for the duration of the COVID-19 declaration under Section 564(b)(1) of the Act, 21 U.S.C. section 360bbb-3(b)(1), unless the authorization is terminated or revoked sooner. Performed at [facility], [place] [address] [place], [place] [address]       Radiology Studies:  Dg Chest Portable 1 View  Result Date: 06/21/2019 CLINICAL DATA:  Shortness of breath. Generalized weakness. Dialysis patient. EXAM: PORTABLE CHEST 1 VIEW COMPARISON:  02/24/2019 FINDINGS: Artifact overlies the chest. There is cardiomegaly with left ventricular prominence. Mediastinal shadows are normal. There is mild venous hypertension but there is no interstitial edema or visible pleural effusion. No sign of consolidation or collapse. Left costophrenic angle excluded from the film. IMPRESSION: Cardiomegaly and mild venous hypertension. No frank edema. No consolidation or collapse. Electronically Signed   By: Nelson Chimes M.D.   On: 06/21/2019 21:48      LOS: 2 days   Time spent: More than 50% of that time was spent in counseling and/or coordination of care.  Antonieta Pert, MD [facility]  06/23/2019, 9:00 AM

## 2019-06-23 NOTE — Care Management Important Message (Signed)
Important Message  Patient Details  Name: Jerry Cantrell MRN: [id number] Date of Birth: 29-Apr-1966   Medicare Important Message Given:  Yes     [name] 06/23/2019, 3:20 PM

## 2019-06-23 NOTE — Progress Notes (Signed)
CRITICAL VALUE ALERT  Critical Value:  K+ 6.4  Date & Time Notied:  06/23/2019 @ [id number]  Provider Notified: Antonieta Pert, MD   Orders Received/Actions taken:

## 2019-06-24 LAB — BASIC METABOLIC PANEL
Anion gap: 18 — ABNORMAL HIGH (ref 5–15)
BUN: 46 mg/dL — ABNORMAL HIGH (ref 6–20)
CO2: 24 mmol/L (ref 22–32)
Calcium: 10.3 mg/dL (ref 8.9–10.3)
Chloride: 93 mmol/L — ABNORMAL LOW (ref 98–111)
Creatinine, Ser: 9.54 mg/dL — ABNORMAL HIGH (ref 0.61–1.24)
GFR calc Af Amer: 6 mL/min — ABNORMAL LOW (ref >60)
GFR calc non Af Amer: 6 mL/min — ABNORMAL LOW (ref >60)
Glucose, Bld: 135 mg/dL — ABNORMAL HIGH (ref 70–99)
Potassium: 5.4 mmol/L — ABNORMAL HIGH (ref 3.5–5.1)
Sodium: 135 mmol/L (ref 135–145)

## 2019-06-24 LAB — GLUCOSE, CAPILLARY
Glucose-Capillary: 160 mg/dL — ABNORMAL HIGH (ref 70–99)
Glucose-Capillary: 168 mg/dL — ABNORMAL HIGH (ref 70–99)

## 2019-06-24 NOTE — Progress Notes (Signed)
Pt refusing CPAP

## 2019-06-24 NOTE — TOC Transition Note (Signed)
Transition of Care [facility]) - CM/SW Discharge Note   Patient Details  Name: Jerry Cantrell MRN: [id number] Date of Birth: 22-Dec-1965  Transition of [facility]) CM/SW Contact:  Carles Collet, RN Phone Number: 06/24/2019, 10:30 AM   Clinical Narrative:    Patient from home w spouse after missing HD. Has HD MWF at [facility]. No new needs at DC.     Final next level of care: Home/Self Care Barriers to Discharge: No Barriers Identified   Patient Goals and CMS Choice        Discharge Placement                       Discharge Plan and Services                                     Social Determinants of Health (SDOH) Interventions     Readmission Risk Interventions Readmission Risk Prevention Plan 06/24/2019  Transportation Screening Complete  Medication Review ([place]) Complete  PCP or Specialist appointment within 3-5 days of discharge Complete  PCP/Specialist Appt Not Complete comments Patient will be seen at HD MWF  [place] or [place] Complete  SW Recovery Care/Counseling Consult Complete  Palliative Care Screening Not [place] Not Applicable  Some recent data might be hidden

## 2019-06-24 NOTE — Plan of Care (Signed)
  Problem: Education: Goal: Knowledge of General Education information will improve Description: Including pain rating scale, medication(s)/side effects and non-pharmacologic comfort measures Outcome: Adequate for Discharge   Problem: Health Behavior/Discharge Planning: Goal: Ability to manage health-related needs will improve Outcome: Adequate for Discharge   Problem: Clinical Measurements: Goal: Ability to maintain clinical measurements within normal limits will improve Outcome: Adequate for Discharge Goal: Will remain free from infection Outcome: Adequate for Discharge Goal: Diagnostic test results will improve Outcome: Adequate for Discharge Goal: Respiratory complications will improve Outcome: Adequate for Discharge Goal: Cardiovascular complication will be avoided Outcome: Adequate for Discharge   Problem: Activity: Goal: Risk for activity intolerance will decrease 06/24/2019 1256 by Baldo Ash, RN Outcome: Adequate for Discharge 06/24/2019 0826 by Baldo Ash, RN Outcome: Progressing   Problem: Nutrition: Goal: Adequate nutrition will be maintained Outcome: Adequate for Discharge   Problem: Coping: Goal: Level of anxiety will decrease Outcome: Adequate for Discharge   Problem: Elimination: Goal: Will not experience complications related to bowel motility Outcome: Adequate for Discharge Goal: Will not experience complications related to urinary retention Outcome: Adequate for Discharge   Problem: Pain Managment: Goal: General experience of comfort will improve Outcome: Adequate for Discharge   Problem: Safety: Goal: Ability to remain free from injury will improve Outcome: Adequate for Discharge   Problem: Skin Integrity: Goal: Risk for impaired skin integrity will decrease Outcome: Adequate for Discharge   Problem: Education: Goal: Knowledge of disease and its progression will improve Outcome: Adequate for Discharge Goal: Individualized  Educational Video(s) Outcome: Adequate for Discharge   Problem: Fluid Volume: Goal: Compliance with measures to maintain balanced fluid volume will improve Outcome: Adequate for Discharge   Problem: Health Behavior/Discharge Planning: Goal: Ability to manage health-related needs will improve Outcome: Adequate for Discharge   Problem: Nutritional: Goal: Ability to make healthy dietary choices will improve Outcome: Adequate for Discharge   Problem: Clinical Measurements: Goal: Complications related to the disease process, condition or treatment will be avoided or minimized Outcome: Adequate for Discharge

## 2019-06-24 NOTE — Discharge Summary (Addendum)
Discharge Summary  Jerry Cantrell GGY:[id number] DOB: 06-23-1966  PCP: Forrest Moron, MD  Admit date: 06/21/2019 Discharge date: 06/24/2019  Time spent: Less than 30 minutes  Recommendations for Outpatient Follow-up:  1. Nephrology for hemodialysis 2. Primary care provider 3. Neurology for outpatient EEG and brain MRI   Discharge Diagnoses:  Active Hospital Problems   Diagnosis Date Noted  . Hyperkalemia 06/21/2019  . Volume overload 02/20/2019  . Chronic kidney disease, stage IV (severe) ([place])   . Proliferative diabetic retinopathy ([place]) 06/08/2013  . OSA treated with BiPAP 02/02/2013  . Lumbar spondylosis 05/03/2012  . Insulin dependent type 2 diabetes mellitus, uncontrolled ([place]) 05/13/1999    Resolved Hospital Problems  No resolved problems to display.    Discharge Condition: Stable improved  Diet recommendation: Diabetic diet 1800-calorie ADA, renal diet  Vitals:   06/24/19 0525 06/24/19 0840  BP: 114/70 104/74  Pulse: 73 82  Resp: 16 18  Temp: 97.9 F (36.6 C) 98.2 F (36.8 C)  SpO2: 96% 98%    History of present illness:  This is a 53 year old male with past medical history significant for insulin-dependent diabetes mellitus obstructive sleep apnea on BiPAP hypertension, end-stage renal disease on hemodialysis on Monday Wednesday and Friday, diastolic congestive heart failure, who was admitted because he was dyspneic felt like his nervous system was shutting down.  He was noted to be quite shaky and nervous.Followed by neurology outpatient. Thought to be secondary to metabolic asterixis and myoclonic jerking for MRI brain and EEG workup outpatient   Hospital Course:  Principal Problem:   Hyperkalemia Active Problems:   Lumbar spondylosis   Insulin dependent type 2 diabetes mellitus, uncontrolled (HCC)   OSA treated with BiPAP   Proliferative diabetic retinopathy (HCC)   Chronic kidney disease, stage IV (severe) (HCC)   Volume overload    Procedures:  Head CT  Consultations:  Nephrology  Discharge Exam: BP 104/74 (BP Location: Right Arm)   Pulse 82   Temp 98.2 F (36.8 C) (Oral)   Resp 18   Wt 128.7 kg   SpO2 98%   BMI 38.48 kg/m   General: Alert oriented x3 slightly of weight Cardiovascular: Regular rate and rhythm no murmur, no edema Respiratory: Effort was normal no rales no rhonchi  Discharge Instructions You were cared for by a hospitalist during your hospital stay. If you have any questions about your discharge medications or the care you received while you were in the hospital after you are discharged, you can call the unit and asked to speak with the hospitalist on call if the hospitalist that took care of you is not available. Once you are discharged, your primary care physician will handle any further medical issues. Please note that NO REFILLS for any discharge medications will be authorized once you are discharged, as it is imperative that you return to your primary care physician (or establish a relationship with a primary care physician if you do not have one) for your aftercare needs so that they can reassess your need for medications and monitor your lab values.  Discharge Instructions    Call MD for:  severe uncontrolled pain   Complete by: As directed    Call MD for:  temperature >100.4   Complete by: As directed    Diet - low sodium heart healthy   Complete by: As directed    RENAL DIET   Discharge instructions   Complete by: As directed    ESRD - HD MWF.  Next HD  Mon at [facility].   Increase activity slowly   Complete by: As directed      Allergies as of 06/24/2019   No Known Allergies     Medication List    TAKE these medications   Accu-Chek Aviva Plus test strip Generic drug: glucose blood USE ONE STRIP 5 (FIVE) TIMES DAILY AS INSTRUCTED   Accu-Chek Softclix Lancets lancets Use as instructed   atorvastatin 80 MG tablet Commonly known as: LIPITOR Take 1 tablet (80 mg total) by  mouth daily at 6 PM.   Auryxia 1 GM 210 MG(Fe) tablet Generic drug: ferric citrate Take 210 mg by mouth 3 (three) times daily with meals.   bacitracin-polymyxin b ointment Commonly known as: POLYSPORIN Apply topically 2 (two) times daily.   carvedilol 25 MG tablet Commonly known as: COREG Take 25 mg by mouth 2 (two) times daily with a meal.   cetirizine 10 MG tablet Commonly known as: ZYRTEC TAKE ONE TABLET BY MOUTH ONCE DAILY FOR ALLERGIES What changed:   how much to take  how to take this  when to take this  additional instructions   cinacalcet 60 MG tablet Commonly known as: SENSIPAR Take 60 mg by mouth daily.   clobetasol cream 0.05 % Commonly known as: TEMOVATE APPLY TO AFFECED ARES(S) TOPICALLY TWICE DAILY What changed: See the new instructions.   Dexcom G6 Receiver Devi 1 Device by Does not apply route as directed.   Dexcom G6 Sensor Misc 1 Device by Does not apply route as directed.   Dexcom G6 Transmitter Misc 1 Device by Does not apply route as directed.   fluconazole 100 MG tablet Commonly known as: DIFLUCAN Take 200 mg by mouth once a week.   fluticasone 50 MCG/ACT nasal spray Commonly known as: FLONASE USE TWO SPRAYS INTO EACH NOSTRILS DAILY What changed: See the new instructions.   folic acid-vitamin b complex-vitamin c-selenium-zinc 3 MG Tabs tablet Take 1 tablet by mouth daily.   furosemide 80 MG tablet Commonly known as: LASIX Take 1 tablet (80 mg total) by mouth every Tuesday, Thursday, Saturday, and Sunday.   hydrALAZINE 100 MG tablet Commonly known as: APRESOLINE TAKE 1 TABLET (100 MG TOTAL) BY MOUTH 3 (THREE) TIMES DAILY.   insulin lispro 100 UNIT/ML KwikPen Commonly known as: HumaLOG KwikPen Inject 0.08 mLs (8 Units total) into the skin 3 (three) times daily. Max daily dose of 100 units daily What changed:   how much to take  additional instructions   Insulin Pen Needle 31G X 5 MM Misc Commonly known as: B-D UF III MINI  PEN NEEDLES Four times daily   Lantus SoloStar 100 UNIT/ML Solostar Pen Generic drug: Insulin Glargine Inject 40 Units into the skin daily. INJECT 30 UNITS UNDER THE SKIN EVERY MORNING AND INJECT 15 UNITS EVERY NIGHT What changed: additional instructions   lidocaine-prilocaine cream Commonly known as: EMLA Apply 1 application topically See admin instructions. Apply 1 application topical on Monday Wednesday and fridays   multivitamin with minerals Tabs tablet Take 1 tablet by mouth daily.   Narcan 4 MG/0.1ML Liqd nasal spray kit Generic drug: naloxone Place 2 mLs into the nose See admin instructions. May repeat dose every 2 to 3 minutes until responsive or ems arrives.   Nicotine 21-14-7 MG/24HR Kit Place 1 patch onto the skin daily.   Olopatadine HCl 0.2 % Soln APPLY 1 DROP TO EYE DAILY. What changed: how to take this   ondansetron 4 MG tablet Commonly known as: Zofran Take 1 tablet (  4 mg total) by mouth every 8 (eight) hours as needed for nausea or vomiting.   Oxycodone HCl 10 MG Tabs Take 10 mg by mouth 4 (four) times daily.   pregabalin 100 MG capsule Commonly known as: LYRICA Take 100 mg by mouth 2 (two) times daily.   rOPINIRole 0.25 MG tablet Commonly known as: REQUIP Take 0.25 mg by mouth at bedtime.   verapamil 240 MG CR tablet Commonly known as: CALAN-SR Take 1 tablet (240 mg total) by mouth at bedtime.      No Known Allergies Follow-up Information    Forrest Moron, MD.   Specialty: Internal Medicine Contact information: [place] [address] [phone]            The results of significant diagnostics from this hospitalization (including imaging, microbiology, ancillary and laboratory) are listed below for reference.    Significant Diagnostic Studies: Dg Chest Portable 1 View  Result Date: 06/21/2019 CLINICAL DATA:  Shortness of breath. Generalized weakness. Dialysis patient. EXAM: PORTABLE CHEST 1 VIEW COMPARISON:   02/24/2019 FINDINGS: Artifact overlies the chest. There is cardiomegaly with left ventricular prominence. Mediastinal shadows are normal. There is mild venous hypertension but there is no interstitial edema or visible pleural effusion. No sign of consolidation or collapse. Left costophrenic angle excluded from the film. IMPRESSION: Cardiomegaly and mild venous hypertension. No frank edema. No consolidation or collapse. Electronically Signed   By: Nelson Chimes M.D.   On: 06/21/2019 21:48    Microbiology: Recent Results (from the past 240 hour(s))  SARS Coronavirus 2 by RT PCR (hospital order, performed in [facility] hospital lab) Nasopharyngeal Nasopharyngeal Swab     Status: None   Collection Time: 06/21/19  9:28 PM   Specimen: Nasopharyngeal Swab  Result Value Ref Range Status   SARS Coronavirus 2 NEGATIVE NEGATIVE Final    Comment: (NOTE) If result is NEGATIVE SARS-CoV-2 target nucleic acids are NOT DETECTED. The SARS-CoV-2 RNA is generally detectable in upper and lower  respiratory specimens during the acute phase of infection. The lowest  concentration of SARS-CoV-2 viral copies this assay can detect is 250  copies / mL. A negative result does not preclude SARS-CoV-2 infection  and should not be used as the sole basis for treatment or other  patient management decisions.  A negative result may occur with  improper specimen collection / handling, submission of specimen other  than nasopharyngeal swab, presence of viral mutation(s) within the  areas targeted by this assay, and inadequate number of viral copies  (<250 copies / mL). A negative result must be combined with clinical  observations, patient history, and epidemiological information. If result is POSITIVE SARS-CoV-2 target nucleic acids are DETECTED. The SARS-CoV-2 RNA is generally detectable in upper and lower  respiratory specimens dur ing the acute phase of infection.  Positive  results are indicative of active infection  with SARS-CoV-2.  Clinical  correlation with patient history and other diagnostic information is  necessary to determine patient infection status.  Positive results do  not rule out bacterial infection or co-infection with other viruses. If result is PRESUMPTIVE POSTIVE SARS-CoV-2 nucleic acids MAY BE PRESENT.   A presumptive positive result was obtained on the submitted specimen  and confirmed on repeat testing.  While 2019 novel coronavirus  (SARS-CoV-2) nucleic acids may be present in the submitted sample  additional confirmatory testing may be necessary for epidemiological  and / or clinical management purposes  to differentiate between  SARS-CoV-2 and other Sarbecovirus  currently known to infect humans.  If clinically indicated additional testing with an alternate test  methodology [phone]) is advised. The SARS-CoV-2 RNA is generally  detectable in upper and lower respiratory sp ecimens during the acute  phase of infection. The expected result is Negative. Fact Sheet for Patients:  StrictlyIdeas.no Fact Sheet for Healthcare Providers: BankingDealers.co.za This test is not yet approved or cleared by the [place] FDA and has been authorized for detection and/or diagnosis of SARS-CoV-2 by FDA under an Emergency Use Authorization (EUA).  This EUA will remain in effect (meaning this test can be used) for the duration of the COVID-19 declaration under Section 564(b)(1) of the Act, 21 U.S.C. section 360bbb-3(b)(1), unless the authorization is terminated or revoked sooner. Performed at [facility], [place] [address], [place], [place] [address]      Labs: Basic Metabolic Panel: Recent Labs  Lab 06/22/19 0201 06/22/19 3428 06/22/19 2039 06/23/19 0604 06/23/19 2055 06/24/19 0556  NA 135 134* 133* 130* 131* 135  K 7.4* 7.0* 6.5* 6.4* 5.3* 5.4*  CL 89* 92* 91* 89* 90* 93*  CO2 29 27 29 26 25 24   GLUCOSE 74 280* 143* 250*  392* 135*  BUN 86* 50* 38* 44* 41* 46*  CREATININE 13.47* 9.72* 8.32* 9.50* 8.61* 9.54*  CALCIUM 10.3 9.6 10.4* 10.4* 10.3 10.3  PHOS 8.3*  --   --   --   --   --    Liver Function Tests: Recent Labs  Lab 06/22/19 0201 06/23/19 2055  AST  --  20  ALT  --  25  ALKPHOS  --  111  BILITOT  --  0.6  PROT  --  6.2*  ALBUMIN 3.5 3.4*   No results for input(s): LIPASE, AMYLASE in the last 168 hours. No results for input(s): AMMONIA in the last 168 hours. CBC: Recent Labs  Lab 06/21/19 1747 06/22/19 0200 06/23/19 0604  WBC 10.3 7.4 7.5  HGB 13.7 12.5* 12.8*  HCT 44.0 39.6 40.4  MCV 97.3 96.4 95.1  PLT 145* 119* 104*   Cardiac Enzymes: No results for input(s): CKTOTAL, CKMB, CKMBINDEX, TROPONINI in the last 168 hours. BNP: BNP (last 3 results) Recent Labs    02/20/19 1454  BNP 457.1*    ProBNP (last 3 results) No results for input(s): PROBNP in the last 8760 hours.  CBG: Recent Labs  Lab 06/23/19 1659 06/23/19 1703 06/23/19 2139 06/24/19 0702 06/24/19 1127  GLUCAP 571* 498* 292* 160* 168*       Signed:  Cristal Deer, MD [facility] 06/24/2019, 12:55 PM

## 2019-06-24 NOTE — Progress Notes (Signed)
Jerry Cantrell to be discharged home per MD order. Discussed prescriptions and follow up appointments with the patient. Prescriptions given to patient; medication list explained in detail. Patient verbalized understanding.  Skin clean, dry and intact without evidence of skin break down, no evidence of skin tears noted. IV catheter discontinued intact. Site without signs and symptoms of complications. Dressing and pressure applied. Pt denies pain at the site currently. No complaints noted.  Patient free of lines, drains, and wounds.   An After Visit Summary (AVS) was printed and given to the patient. Patient escorted via wheelchair, and discharged home via private auto.  Baldo Ash, RN

## 2019-06-24 NOTE — Plan of Care (Signed)
  Problem: Activity: Goal: Risk for activity intolerance will decrease Outcome: Progressing   

## 2019-06-24 NOTE — Progress Notes (Signed)
[facility] Progress Note   Home meds: - verapamil 240 cr qd/ hydralazine 100 tid/ furosemide 80 tts/ carvedilol 25 bid - atorvastatin 80 - insulin glargine 40/ lispro 10 tid - ropinirole 0.25 hs/ pregabalin 100 bid - auryxia/ cinacalcet 60 qd   Outpt HD:[facility] 4.5h 400/800 120kg 2/2 bath Hep 2600 LFA AVF - no esa, no Fe  Assessment/ Plan:   1. Severe hyperkalemia - Signing off early but on back to back days he completed a 2hr32min treatment with 2.12L UF on 10/23.  - He should be able to go home and f/u with [facility] - Eventually would benefit with fistulogram; on PE mild to moderate juxta lesion.  2. HTN/ vol - BP on the lower side but stable and asymp. 3. ESRD - HD MWF. Next HD Mon at [facility].  4. HTN/ volume  -  SOB, lungs clear 5. DM2 - per primary  Subjective:   no new c/o's and actually feels much better with resolution of tremors and also weakness. No events overnight.   Objective:   BP 114/70 (BP Location: Right Arm)   Pulse 73   Temp 97.9 F (36.6 C) (Oral)   Resp 16   Wt 128.7 kg   SpO2 96%   BMI 38.48 kg/m   Intake/Output Summary (Last 24 hours) at 06/24/2019 0747 Last data filed at 06/24/2019 0200 Gross per 24 hour  Intake 480 ml  Output 2124 ml  Net -1644 ml   Weight change: -0.6 kg  Physical Exam: Genalert, no distress, calm No jvd or bruits Chest clear bilatto bases RRR no MRG Abd soft ntnd no mass or ascites +bsobese Extno edema Neuro is alert, Ox 3 , nf LFA RCF +bruit  Imaging: No results found.  Labs: BMET Recent Labs  Lab 06/21/19 1747 06/22/19 0201 06/22/19 0733 06/22/19 2039 06/23/19 0604 06/23/19 2055  NA 135 135 134* 133* 130* 131*  K >7.5* 7.4* 7.0* 6.5* 6.4* 5.3*  CL 89* 89* 92* 91* 89* 90*  CO2 30 29 27 29 26 25   GLUCOSE 73 74 280* 143* 250* 392*  BUN 84* 86* 50* 38* 44* 41*  CREATININE 12.90* 13.47* 9.72* 8.32* 9.50* 8.61*  CALCIUM 10.2 10.3 9.6 10.4* 10.4* 10.3  PHOS  --   8.3*  --   --   --   --    CBC Recent Labs  Lab 06/21/19 1747 06/22/19 0200 06/23/19 0604  WBC 10.3 7.4 7.5  HGB 13.7 12.5* 12.8*  HCT 44.0 39.6 40.4  MCV 97.3 96.4 95.1  PLT 145* 119* 104*    Medications:    . atorvastatin  80 mg Oral q1800  . carvedilol  25 mg Oral BID WC  . Chlorhexidine Gluconate Cloth  6 each Topical Q0600  . Chlorhexidine Gluconate Cloth  6 each Topical Q0600  . cinacalcet  60 mg Oral Q breakfast  . ferric citrate  210 mg Oral TID WC  . furosemide  80 mg Oral Q T,Th,S,Su  . heparin  5,000 Units Subcutaneous Q8H  . hydrALAZINE  100 mg Oral TID  . insulin aspart  0-15 Units Subcutaneous TID WC  . insulin aspart  8 Units Subcutaneous TID WC  . insulin glargine  15 Units Subcutaneous QHS  . insulin glargine  30 Units Subcutaneous Daily  . oxyCODONE  10 mg Oral QID  . pregabalin  100 mg Oral BID  . rOPINIRole  0.25 mg Oral QHS  . sodium chloride flush  3 mL Intravenous Once  .  sodium zirconium cyclosilicate  5 g Oral Once  . traMADol  50 mg Oral Once  . verapamil  240 mg Oral QHS      Otelia Santee, MD 06/24/2019, 7:47 AM

## 2019-06-24 NOTE — Plan of Care (Signed)
  Problem: Education: Goal: Knowledge of General Education information will improve Description: Including pain rating scale, medication(s)/side effects and non-pharmacologic comfort measures Outcome: Progressing   Problem: Clinical Measurements: Goal: Ability to maintain clinical measurements within normal limits will improve Outcome: Progressing   

## 2019-06-25 NOTE — Telephone Encounter (Signed)
Transition of care contact from inpatient facility  Date of Discharge: 06/24/2019 Date of Contact:     06/25/2019 Method of contact: phone  Talked with: patient  Patient contact to discuss transition of care from recent inpatient hospitalization. Pateint was admitted to [facility] from 10/21-10/24/2020 with d/c primary diagnosis of hyperkalemia.    Patient had some concerns about outpatient dialysis center that I advised he could address with rounding provider tomorrow or the Unit Manager.  Medication changes  None. Patient will follow up at outpatient dialysis on Monday October 27th.  Other follow up needs: dialysis center to check K levels weekly x 3,  Per Dr. Augustin Coupe needs eval of mild to mod jutxa lesion of AVF at some point. For repeat AF in the mean time.  Amalia Hailey, PA-C [facility] Pager:  [phone]

## 2019-06-26 DIAGNOSIS — M545 Low back pain: Secondary | ICD-10-CM | POA: Diagnosis not present

## 2019-06-26 DIAGNOSIS — E875 Hyperkalemia: Secondary | ICD-10-CM | POA: Diagnosis not present

## 2019-06-26 DIAGNOSIS — Z992 Dependence on renal dialysis: Secondary | ICD-10-CM | POA: Diagnosis not present

## 2019-06-26 DIAGNOSIS — E8779 Other fluid overload: Secondary | ICD-10-CM | POA: Diagnosis not present

## 2019-06-26 DIAGNOSIS — E1122 Type 2 diabetes mellitus with diabetic chronic kidney disease: Secondary | ICD-10-CM | POA: Diagnosis not present

## 2019-06-26 DIAGNOSIS — Z79899 Other long term (current) drug therapy: Secondary | ICD-10-CM | POA: Diagnosis not present

## 2019-06-26 DIAGNOSIS — D689 Coagulation defect, unspecified: Secondary | ICD-10-CM | POA: Diagnosis not present

## 2019-06-26 DIAGNOSIS — N2581 Secondary hyperparathyroidism of renal origin: Secondary | ICD-10-CM | POA: Diagnosis not present

## 2019-06-26 DIAGNOSIS — N186 End stage renal disease: Secondary | ICD-10-CM | POA: Diagnosis not present

## 2019-06-26 LAB — GLUCOSE, CAPILLARY: Glucose-Capillary: 101 mg/dL — ABNORMAL HIGH (ref 70–99)

## 2019-06-27 DIAGNOSIS — M545 Low back pain: Secondary | ICD-10-CM | POA: Diagnosis not present

## 2019-06-27 DIAGNOSIS — M5431 Sciatica, right side: Secondary | ICD-10-CM | POA: Diagnosis not present

## 2019-06-27 DIAGNOSIS — M5432 Sciatica, left side: Secondary | ICD-10-CM | POA: Diagnosis not present

## 2019-06-27 DIAGNOSIS — G890 Central pain syndrome: Secondary | ICD-10-CM | POA: Diagnosis not present

## 2019-06-28 DIAGNOSIS — M545 Low back pain: Secondary | ICD-10-CM | POA: Diagnosis not present

## 2019-06-28 DIAGNOSIS — D689 Coagulation defect, unspecified: Secondary | ICD-10-CM | POA: Diagnosis not present

## 2019-06-28 DIAGNOSIS — N186 End stage renal disease: Secondary | ICD-10-CM | POA: Diagnosis not present

## 2019-06-28 DIAGNOSIS — E8779 Other fluid overload: Secondary | ICD-10-CM | POA: Diagnosis not present

## 2019-06-28 DIAGNOSIS — Z79899 Other long term (current) drug therapy: Secondary | ICD-10-CM | POA: Diagnosis not present

## 2019-06-28 DIAGNOSIS — Z992 Dependence on renal dialysis: Secondary | ICD-10-CM | POA: Diagnosis not present

## 2019-06-28 DIAGNOSIS — E1122 Type 2 diabetes mellitus with diabetic chronic kidney disease: Secondary | ICD-10-CM | POA: Diagnosis not present

## 2019-06-28 DIAGNOSIS — E875 Hyperkalemia: Secondary | ICD-10-CM | POA: Diagnosis not present

## 2019-06-28 DIAGNOSIS — N2581 Secondary hyperparathyroidism of renal origin: Secondary | ICD-10-CM | POA: Diagnosis not present

## 2019-06-30 DIAGNOSIS — D689 Coagulation defect, unspecified: Secondary | ICD-10-CM | POA: Diagnosis not present

## 2019-06-30 DIAGNOSIS — N2581 Secondary hyperparathyroidism of renal origin: Secondary | ICD-10-CM | POA: Diagnosis not present

## 2019-06-30 DIAGNOSIS — M545 Low back pain: Secondary | ICD-10-CM | POA: Diagnosis not present

## 2019-06-30 DIAGNOSIS — E875 Hyperkalemia: Secondary | ICD-10-CM | POA: Diagnosis not present

## 2019-06-30 DIAGNOSIS — Z79899 Other long term (current) drug therapy: Secondary | ICD-10-CM | POA: Diagnosis not present

## 2019-06-30 DIAGNOSIS — N186 End stage renal disease: Secondary | ICD-10-CM | POA: Diagnosis not present

## 2019-06-30 DIAGNOSIS — E8779 Other fluid overload: Secondary | ICD-10-CM | POA: Diagnosis not present

## 2019-06-30 DIAGNOSIS — E1122 Type 2 diabetes mellitus with diabetic chronic kidney disease: Secondary | ICD-10-CM | POA: Diagnosis not present

## 2019-06-30 DIAGNOSIS — Z992 Dependence on renal dialysis: Secondary | ICD-10-CM | POA: Diagnosis not present

## 2019-07-01 DIAGNOSIS — N186 End stage renal disease: Secondary | ICD-10-CM | POA: Diagnosis not present

## 2019-07-01 DIAGNOSIS — E1122 Type 2 diabetes mellitus with diabetic chronic kidney disease: Secondary | ICD-10-CM | POA: Diagnosis not present

## 2019-07-01 DIAGNOSIS — Z992 Dependence on renal dialysis: Secondary | ICD-10-CM | POA: Diagnosis not present

## 2019-07-03 DIAGNOSIS — M47896 Other spondylosis, lumbar region: Secondary | ICD-10-CM | POA: Diagnosis not present

## 2019-07-03 DIAGNOSIS — E875 Hyperkalemia: Secondary | ICD-10-CM | POA: Diagnosis not present

## 2019-07-03 DIAGNOSIS — N2581 Secondary hyperparathyroidism of renal origin: Secondary | ICD-10-CM | POA: Diagnosis not present

## 2019-07-03 DIAGNOSIS — E785 Hyperlipidemia, unspecified: Secondary | ICD-10-CM | POA: Diagnosis not present

## 2019-07-03 DIAGNOSIS — N186 End stage renal disease: Secondary | ICD-10-CM | POA: Diagnosis not present

## 2019-07-03 DIAGNOSIS — G5710 Meralgia paresthetica, unspecified lower limb: Secondary | ICD-10-CM | POA: Diagnosis not present

## 2019-07-03 DIAGNOSIS — M4807 Spinal stenosis, lumbosacral region: Secondary | ICD-10-CM | POA: Diagnosis not present

## 2019-07-03 DIAGNOSIS — M545 Low back pain: Secondary | ICD-10-CM | POA: Diagnosis not present

## 2019-07-03 DIAGNOSIS — D689 Coagulation defect, unspecified: Secondary | ICD-10-CM | POA: Diagnosis not present

## 2019-07-03 DIAGNOSIS — Z992 Dependence on renal dialysis: Secondary | ICD-10-CM | POA: Diagnosis not present

## 2019-07-05 ENCOUNTER — Telehealth: Payer: Self-pay | Admitting: Family Medicine

## 2019-07-05 DIAGNOSIS — N2581 Secondary hyperparathyroidism of renal origin: Secondary | ICD-10-CM | POA: Diagnosis not present

## 2019-07-05 DIAGNOSIS — N186 End stage renal disease: Secondary | ICD-10-CM | POA: Diagnosis not present

## 2019-07-05 DIAGNOSIS — M4807 Spinal stenosis, lumbosacral region: Secondary | ICD-10-CM | POA: Diagnosis not present

## 2019-07-05 DIAGNOSIS — M47896 Other spondylosis, lumbar region: Secondary | ICD-10-CM | POA: Diagnosis not present

## 2019-07-05 DIAGNOSIS — G5710 Meralgia paresthetica, unspecified lower limb: Secondary | ICD-10-CM | POA: Diagnosis not present

## 2019-07-05 DIAGNOSIS — E785 Hyperlipidemia, unspecified: Secondary | ICD-10-CM | POA: Diagnosis not present

## 2019-07-05 DIAGNOSIS — D689 Coagulation defect, unspecified: Secondary | ICD-10-CM | POA: Diagnosis not present

## 2019-07-05 DIAGNOSIS — E875 Hyperkalemia: Secondary | ICD-10-CM | POA: Diagnosis not present

## 2019-07-05 DIAGNOSIS — M545 Low back pain: Secondary | ICD-10-CM | POA: Diagnosis not present

## 2019-07-05 DIAGNOSIS — Z992 Dependence on renal dialysis: Secondary | ICD-10-CM | POA: Diagnosis not present

## 2019-07-05 NOTE — Telephone Encounter (Signed)
Forms received and placed it in providers box to be completed with the old form

## 2019-07-05 NOTE — Telephone Encounter (Signed)
[facility] resent pprwrk for pt. Pt was denied due to incomplete pprwrk. Staff urges for pcp to complete every page in its entirety

## 2019-07-07 DIAGNOSIS — M545 Low back pain: Secondary | ICD-10-CM | POA: Diagnosis not present

## 2019-07-07 DIAGNOSIS — N186 End stage renal disease: Secondary | ICD-10-CM | POA: Diagnosis not present

## 2019-07-07 DIAGNOSIS — N2581 Secondary hyperparathyroidism of renal origin: Secondary | ICD-10-CM | POA: Diagnosis not present

## 2019-07-07 DIAGNOSIS — E875 Hyperkalemia: Secondary | ICD-10-CM | POA: Diagnosis not present

## 2019-07-07 DIAGNOSIS — M4807 Spinal stenosis, lumbosacral region: Secondary | ICD-10-CM | POA: Diagnosis not present

## 2019-07-07 DIAGNOSIS — D689 Coagulation defect, unspecified: Secondary | ICD-10-CM | POA: Diagnosis not present

## 2019-07-07 DIAGNOSIS — E785 Hyperlipidemia, unspecified: Secondary | ICD-10-CM | POA: Diagnosis not present

## 2019-07-07 DIAGNOSIS — Z992 Dependence on renal dialysis: Secondary | ICD-10-CM | POA: Diagnosis not present

## 2019-07-07 DIAGNOSIS — M47896 Other spondylosis, lumbar region: Secondary | ICD-10-CM | POA: Diagnosis not present

## 2019-07-07 DIAGNOSIS — G5710 Meralgia paresthetica, unspecified lower limb: Secondary | ICD-10-CM | POA: Diagnosis not present

## 2019-07-10 ENCOUNTER — Encounter: Payer: Self-pay | Admitting: Acute Care

## 2019-07-10 ENCOUNTER — Other Ambulatory Visit: Payer: Self-pay | Admitting: Family Medicine

## 2019-07-10 ENCOUNTER — Telehealth: Payer: Self-pay

## 2019-07-10 ENCOUNTER — Ambulatory Visit (INDEPENDENT_AMBULATORY_CARE_PROVIDER_SITE_OTHER): Payer: Medicare Other | Admitting: Acute Care

## 2019-07-10 ENCOUNTER — Other Ambulatory Visit: Payer: Self-pay | Admitting: Internal Medicine

## 2019-07-10 DIAGNOSIS — D689 Coagulation defect, unspecified: Secondary | ICD-10-CM | POA: Diagnosis not present

## 2019-07-10 DIAGNOSIS — G4733 Obstructive sleep apnea (adult) (pediatric): Secondary | ICD-10-CM | POA: Diagnosis not present

## 2019-07-10 DIAGNOSIS — Z992 Dependence on renal dialysis: Secondary | ICD-10-CM | POA: Diagnosis not present

## 2019-07-10 DIAGNOSIS — E875 Hyperkalemia: Secondary | ICD-10-CM | POA: Diagnosis not present

## 2019-07-10 DIAGNOSIS — M47896 Other spondylosis, lumbar region: Secondary | ICD-10-CM | POA: Diagnosis not present

## 2019-07-10 DIAGNOSIS — N186 End stage renal disease: Secondary | ICD-10-CM | POA: Diagnosis not present

## 2019-07-10 DIAGNOSIS — E785 Hyperlipidemia, unspecified: Secondary | ICD-10-CM | POA: Diagnosis not present

## 2019-07-10 DIAGNOSIS — N2581 Secondary hyperparathyroidism of renal origin: Secondary | ICD-10-CM | POA: Diagnosis not present

## 2019-07-10 DIAGNOSIS — M545 Low back pain: Secondary | ICD-10-CM | POA: Diagnosis not present

## 2019-07-10 DIAGNOSIS — M4807 Spinal stenosis, lumbosacral region: Secondary | ICD-10-CM | POA: Diagnosis not present

## 2019-07-10 DIAGNOSIS — G5710 Meralgia paresthetica, unspecified lower limb: Secondary | ICD-10-CM | POA: Diagnosis not present

## 2019-07-10 NOTE — Telephone Encounter (Signed)
I have not. Vaughan Basta, could you please request this paper work again so it can be completed?

## 2019-07-10 NOTE — Telephone Encounter (Signed)
Please advise if you have the "paper" or have seen the "paper" pt is referring to so that the correction can be made.

## 2019-07-10 NOTE — Telephone Encounter (Signed)
Patient called in stating that he was unable to get his Dexcom because they are saying that the MD on the paper is a male and not a male like patient is saying   Please call patient and advise

## 2019-07-10 NOTE — Progress Notes (Signed)
Virtual Visit via Video Note  I connected with Jerry Cantrell on 07/10/19 at  9:00 AM EST by a video enabled telemedicine application and verified that I am speaking with the correct person using two identifiers.  Location: Patient: At home Provider: [place], [place]. [place]. [address]   I discussed the limitations of evaluation and management by telemedicine and the availability of in person appointments. The patient expressed understanding and agreed to proceed.  Jerry Cantrell is a 53 y.o. male current every day smokerwithpolysubstance abuse, severe OSA on nocturnal BiPAP, chronic hypoxic respiratory failure and ESRD requiring HD. He is followed by Dr. Elsworth Soho  Synopsis 53 year old male active smoker with polysubstance abuse (cocaine) followed for severe obstructive sleep apnea on nocturnal BiPAP, chronic hypoxic respiratory failurepreviously on O2 (d/c 2019 ) ,stable pulmonary nodules(serial CT chest x 3Years 2016-2019), Chronic Bronchitis -active smoker (Mostly Restrictive dz on PFT, CT )  Medical history significant for chronic diastolic heart failure and chronic kidney disease stage IV-has an AV fistula in the left arm Hemodialysis MWF. BiPAP - [facility]; 5-25 ps 5 bipap auto. He had bipap titration study in July 2020 (optimal setting 28/23 cm H20).  History of Present Illness: Pt. Presents for follow up of OSA. He is on BiPAP for OSA. He has had recent Down Loads that indicate he has not had adequate control of his OSA . He had a sleep titration study that indicated his optimal pressures settings should be 28/23. We placed these orders 06/05/2019.We were notified by the DME that his current BIPAP machine only goes up to 25/ 23 cm H2O.  We placed an order for a new BiPAP machine that had capability of 28 cm pressure. This machine has not been delivered to him as of yet. His down load today indicates he still has AHI of 10.9.   The patient has recently been  hospitalized 10/21-10/24 for  for  Dyspnea . He called 911 and it was noted that his potassium was > 7. He was eating too many fried green tomatoes, plumbs and nectarines. He had emergent HD which resolved his elevated potassium. He states he is feeling much better. He needs a new strap for his BiPAP machine and he is complaining of a leak, which his down load confirms. We will order a new mask as well as re-order the new machine.He states he does not have daytime sleepiness, he feels rested daily.   He states the DME did call him about re-programing his BIPAP machine to fix his humidity problem. They have not contacted him about the new machine we ordered 06/05/2019.   Observations/Objective: 07/14/2019-07/06/2019 Down Load Air Curve 10 VAuto 25/23 cm H2O Usage 20/24 days  ( He was in the hospital x 4 days and was on BiPAP at bedtime as an inpatient) Average time 8 hours 23 minutes AI= 9.7 HI= 1.2 AHI= 10.9  Assessment and Plan: OSA on BiPAP Continued elevated AHI Needs new machine with capability of at least 28 cm H2O. Needs to be able to go higher in case his need continues to climb Denies any daytime sleepiness Plan Place Order for new BiPAP with Max capacity to meet at least 28 cm H2O pressure Place Order for new equipment and supplies Continue on BiPAP at bedtime. You appear to be benefiting from the treatment  Goal is to wear for at least 6 hours each night for maximal clinical benefit. Continue to work on weight loss, as the link between excess weight  and sleep apnea is well established.   Remember to establish a good bedtime routine, and work on sleep hygiene.  Limit daytime naps , avoid stimulants such as caffeine and nicotine close to bedtime, exercise daily to promote sleep quality, avoid heavy , spicy, fried , or rich foods before bed. Ensure adequate exposure to natural light during the day,establish a relaxing bedtime routine with a pleasant sleep environment ( Bedroom  between 60 and 67 degrees, turn off bright lights , TV or device screens screens , consider black out curtains or white noise machines) Do not drive if sleepy. Remember to clean mask, tubing, filter, and reservoir once weekly with soapy water.  Follow up with Sarah NP 08/14/2019 at 9 am  or before as needed.    Follow Up Instructions: Follow up Tele Visit Monday Dec 14th at 9 am.    I discussed the assessment and treatment plan with the patient. The patient was provided an opportunity to ask questions and all were answered. The patient agreed with the plan and demonstrated an understanding of the instructions.   The patient was advised to call back or seek an in-person evaluation if the symptoms worsen or if the condition fails to improve as anticipated.  This appointment was 25  min long encounter with  non face-to-face patient care, assessment, plan of care, and follow-up.    Magdalen Spatz, NP 07/10/2019 9:48 AM

## 2019-07-10 NOTE — Addendum Note (Signed)
Addended by: Doroteo Glassman D on: 07/10/2019 10:06 AM   Modules accepted: Orders

## 2019-07-10 NOTE — Patient Instructions (Addendum)
It is good to talk with you today.  Im sorry you have been in the hospital. We will call the DME and see what progress has been made in getting a new machine with the capability of a Max pressure of 28 cm pressure. ( Order placed 06/05/2019) We will place an  order for new supplies. Continue on BiPAP at bedtime. You appear to be benefiting from the treatment  Goal is to wear for at least 6 hours each night for maximal clinical benefit. Continue to work on weight loss, as the link between excess weight  and sleep apnea is well established.   Remember to establish a good bedtime routine, and work on sleep hygiene.  Limit daytime naps , avoid stimulants such as caffeine and nicotine close to bedtime, exercise daily to promote sleep quality, avoid heavy , spicy, fried , or rich foods before bed. Ensure adequate exposure to natural light during the day,establish a relaxing bedtime routine with a pleasant sleep environment ( Bedroom between 60 and 67 degrees, turn off bright lights , TV or device screens screens , consider black out curtains or white noise machines) Do not drive if sleepy. Remember to clean mask, tubing, filter, and reservoir once weekly with soapy water.  Follow up with Sarah NP  In 6 weeks  or before as needed.  Follow up Tele Visit Monday Dec 14th at 9 am.  Please contact office for sooner follow up if symptoms do not improve or worsen or seek emergency care

## 2019-07-11 ENCOUNTER — Encounter: Payer: Self-pay | Admitting: Family Medicine

## 2019-07-11 ENCOUNTER — Telehealth: Payer: Self-pay | Admitting: Family Medicine

## 2019-07-11 ENCOUNTER — Other Ambulatory Visit: Payer: Self-pay

## 2019-07-11 ENCOUNTER — Ambulatory Visit (INDEPENDENT_AMBULATORY_CARE_PROVIDER_SITE_OTHER): Payer: Medicare Other | Admitting: Family Medicine

## 2019-07-11 ENCOUNTER — Telehealth: Payer: Self-pay | Admitting: Acute Care

## 2019-07-11 VITALS — BP 138/82 | HR 77 | Ht 72.0 in

## 2019-07-11 DIAGNOSIS — Z992 Dependence on renal dialysis: Secondary | ICD-10-CM | POA: Diagnosis not present

## 2019-07-11 DIAGNOSIS — E083499 Diabetes mellitus due to underlying condition with severe nonproliferative diabetic retinopathy without macular edema, unspecified eye: Secondary | ICD-10-CM | POA: Diagnosis not present

## 2019-07-11 DIAGNOSIS — IMO0002 Reserved for concepts with insufficient information to code with codable children: Secondary | ICD-10-CM

## 2019-07-11 DIAGNOSIS — E0865 Diabetes mellitus due to underlying condition with hyperglycemia: Secondary | ICD-10-CM

## 2019-07-11 DIAGNOSIS — Z794 Long term (current) use of insulin: Secondary | ICD-10-CM | POA: Diagnosis not present

## 2019-07-11 DIAGNOSIS — N186 End stage renal disease: Secondary | ICD-10-CM | POA: Diagnosis not present

## 2019-07-11 DIAGNOSIS — R739 Hyperglycemia, unspecified: Secondary | ICD-10-CM

## 2019-07-11 LAB — GLUCOSE, POCT (MANUAL RESULT ENTRY)
POC Glucose: 357 mg/dl — AB (ref 70–99)
POC Glucose: 413 mg/dl — AB (ref 70–99)

## 2019-07-11 MED ORDER — INSULIN REGULAR HUMAN 100 UNIT/ML IJ SOLN: 10.0000 [IU] | Freq: Once | INTRAMUSCULAR | Status: DC

## 2019-07-11 NOTE — Telephone Encounter (Signed)
Paperwork for dexcom was signed By Dr. Kelton Pillar, and I faxed it to [facility].  Pt., was notified of this.

## 2019-07-11 NOTE — Patient Instructions (Signed)
° ° ° °  If you have lab work done today you will be contacted with your lab results within the next 2 weeks.  If you have not heard from us then please contact us. The fastest way to get your results is to register for My Chart. ° ° °IF you received an x-ray today, you will receive an invoice from [facility]. Please contact [facility] at [phone] with questions or concerns regarding your invoice.  ° °IF you received labwork today, you will receive an invoice from [facility]. Please contact [facility] at [phone] with questions or concerns regarding your invoice.  ° °Our billing staff will not be able to assist you with questions regarding bills from these companies. ° °You will be contacted with the lab results as soon as they are available. The fastest way to get your results is to activate your My Chart account. Instructions are located on the last page of this paperwork. If you have not heard from us regarding the results in 2 weeks, please contact this office. °  ° ° ° °

## 2019-07-11 NOTE — Progress Notes (Signed)
Established Patient Office Visit  Subjective:  Patient ID: Jerry Cantrell, male    DOB: 12/10/1965  Age: 53 y.o. MRN: [id number]  CC:  Chief Complaint  Patient presents with   Diabetes    follow up, unstable gait, pt has not eaten. blood sugar 357 says he only ha coffee with creamer. Not able to get vs due to unstable gait. Very shakey and fatigue    HPI Jerry Cantrell presents for   This morning his glucose was 119 fasting. He had a cup of coffee with sugar free creamer. In the office his blood glucose was 357. He states that his sugars have been very labile and he feels very weak. He drove himself here to the doctor but has his dog in the car.  Patient states that he feels like he just has not energy and is wondering if something is wrong with his pancreas He states that he needs assistance from his home health aid for more than the two hours allotted. He cannot shower without help. He feels tired all the time.   Past Medical History:  Diagnosis Date   Acute diastolic heart failure (HCC)    CHF (congestive heart failure) ([place])    Diabetes mellitus with nephropathy ([place]) 05/12/2012   Dr. Justin Mend follows - dialysis three times weekly   Hemorrhoids 05/12/2012   Hyperlipidemia    Hypertension    Lumbar spinal stenosis 05/03/2012   Mild with only right L4 nerve root encroachment, no neurogenic claudication    Lumbar spondylosis 05/03/2012   Meralgia paraesthetica 05/03/2012   Meralgia paraesthetica 05/03/2012   On Lyrica which does improve pain.    Neuropathy in diabetes ([place]) 05/12/2012   Obesity    Pneumonia    Retinopathy    Sleep apnea    uses cpap   Substance abuse ([place])    Urinary incontinence 05/12/2012   Since the start of Sept. 2013     Past Surgical History:  Procedure Laterality Date   ABDOMINAL SURGERY     Abscess I&D 2/2 infected hair   AV FISTULA PLACEMENT Left 02/06/2016   Procedure: LEFT ARM RADIOCEPHALIC ARTERIOVENOUS (AV) FISTULA CREATION;   Surgeon: Rosetta Posner, MD;  Location: [facility];  Service: Vascular;  Laterality: Left;   COLONOSCOPY W/ POLYPECTOMY     pt to bring records   COLONOSCOPY WITH PROPOFOL N/A 06/05/2016   Procedure: COLONOSCOPY WITH PROPOFOL;  Surgeon: Doran Stabler, MD;  [facility];  Service: Gastroenterology;  Laterality: N/A;   TONSILLECTOMY      Family History  Problem Relation Age of Onset   Prostate cancer Father    Alcohol abuse Father    Emphysema Father        smoked   Memory loss Mother    Diabetes Sister    Colon cancer Neg Hx    Colon polyps Neg Hx    Stomach cancer Neg Hx    Rectal cancer Neg Hx     Social History   Socioeconomic History   Marital status: Single    Spouse name: Not on file   Number of children: 4   Years of education: GED   Highest education level: Not on file  Occupational History   Occupation: Disabled  Scientist, product/process development strain: Not on file   Food insecurity    Worry: Not on file    Inability: Not on file   Transportation needs    Medical: Not on file  Non-medical: Not on file  Tobacco Use   Smoking status: Current Every Day Smoker    Packs/day: 0.50    Years: 30.00    Pack years: 15.00    Types: Cigarettes   Smokeless tobacco: Never Used  Substance and Sexual Activity   Alcohol use: No    Comment: no longer drinking   Drug use: No    Types: Marijuana, Cocaine    Comment: no longer using - marijuana "laced with something"; today, stated "no" to question of illegal drug use 06/16/2016   Sexual activity: Not on file  Lifestyle   Physical activity    Days per week: Not on file    Minutes per session: Not on file   Stress: Not on file  Relationships   Social connections    Talks on phone: Not on file    Gets together: Not on file    Attends religious service: Not on file    Active member of club or organization: Not on file    Attends meetings of clubs or organizations: Not on file     Relationship status: Not on file   Intimate partner violence    Fear of current or ex partner: Not on file    Emotionally abused: Not on file    Physically abused: Not on file    Forced sexual activity: Not on file  Other Topics Concern   Not on file  Social History Narrative   Lives at home with a caregiver.   One cup caffeine daily.   Right-handed.    Outpatient Medications Prior to Visit  Medication Sig Dispense Refill   ACCU-CHEK AVIVA PLUS test strip USE ONE STRIP 5 (FIVE) TIMES DAILY AS INSTRUCTED 400 each 0   Accu-Chek Softclix Lancets lancets Use as instructed 450 each 0   atorvastatin (LIPITOR) 80 MG tablet Take 1 tablet (80 mg total) by mouth daily at 6 PM. 90 tablet 0   AURYXIA 1 GM 210 MG(Fe) tablet Take 210 mg by mouth 3 (three) times daily with meals.      bacitracin-polymyxin b (POLYSPORIN) ointment Apply topically 2 (two) times daily. 15 g 0   carvedilol (COREG) 25 MG tablet Take 25 mg by mouth 2 (two) times daily with a meal.     cetirizine (ZYRTEC) 10 MG tablet TAKE ONE TABLET BY MOUTH ONCE DAILY FOR ALLERGIES 90 tablet 0   cinacalcet (SENSIPAR) 60 MG tablet Take 60 mg by mouth daily.     clobetasol cream (TEMOVATE) 0.05 % APPLY TO AFFECED ARES(S) TOPICALLY TWICE DAILY (Patient taking differently: Apply 1 application topically 2 (two) times daily. ) 45 g 0   Continuous Blood Gluc Receiver (DEXCOM G6 RECEIVER) DEVI 1 Device by Does not apply route as directed. 1 Device 0   fluconazole (DIFLUCAN) 100 MG tablet Take 200 mg by mouth once a week.      fluticasone (FLONASE) 50 MCG/ACT nasal spray USE TWO SPRAYS INTO EACH NOSTRILS DAILY (Patient taking differently: Place 2 sprays into both nostrils daily. USE TWO SPRAYS INTO EACH NOSTRILS DAILY) 16 g 6   folic acid-vitamin b complex-vitamin c-selenium-zinc (DIALYVITE) 3 MG TABS tablet Take 1 tablet by mouth daily.     furosemide (LASIX) 80 MG tablet Take 1 tablet (80 mg total) by mouth every Tuesday, Thursday,  Saturday, and Sunday. 120 tablet 0   HUMALOG KWIKPEN 100 UNIT/ML KwikPen INJECT 8 UNITS TOTAL INTO THE SKIN 3 (THREE) TIMES DAILY.  MAX DAILY DOSE OF 100 UNITS DAILY  90 mL 0   hydrALAZINE (APRESOLINE) 100 MG tablet TAKE 1 TABLET (100 MG TOTAL) BY MOUTH 3 (THREE) TIMES DAILY. 270 tablet 1   Insulin Glargine (LANTUS SOLOSTAR) 100 UNIT/ML Solostar Pen Inject 40 Units into the skin daily. INJECT 30 UNITS UNDER THE SKIN EVERY MORNING AND INJECT 15 UNITS EVERY NIGHT (Patient taking differently: Inject 40 Units into the skin daily. ) 36 mL 3   Insulin Pen Needle (B-D UF III MINI PEN NEEDLES) 31G X 5 MM MISC Four times daily 360 each 0   lidocaine-prilocaine (EMLA) cream Apply 1 application topically See admin instructions. Apply 1 application topical on Monday Wednesday and fridays     Multiple Vitamin (MULTIVITAMIN WITH MINERALS) TABS tablet Take 1 tablet by mouth daily.     NARCAN 4 MG/0.1ML LIQD nasal spray kit Place 2 mLs into the nose See admin instructions. May repeat dose every 2 to 3 minutes until responsive or ems arrives.     Nicotine 21-14-7 MG/24HR KIT Place 1 patch onto the skin daily. 56 each 0   Olopatadine HCl 0.2 % SOLN APPLY 1 DROP TO EYE DAILY. (Patient taking differently: Place 1 drop into both eyes daily. ) 2.5 mL 2   ondansetron (ZOFRAN) 4 MG tablet Take 1 tablet (4 mg total) by mouth every 8 (eight) hours as needed for nausea or vomiting. 30 tablet 0   Oxycodone HCl 10 MG TABS Take 10 mg by mouth 4 (four) times daily.      pregabalin (LYRICA) 100 MG capsule Take 100 mg by mouth 2 (two) times daily.      rOPINIRole (REQUIP) 0.25 MG tablet Take 0.25 mg by mouth at bedtime.     verapamil (CALAN-SR) 240 MG CR tablet Take 1 tablet (240 mg total) by mouth at bedtime. 90 tablet 3   No facility-administered medications prior to visit.     No Known Allergies  ROS Review of Systems Review of Systems  Constitutional: Negative for activity change, appetite change, chills  and fever.  HENT: Negative for congestion, nosebleeds, trouble swallowing and voice change.   Respiratory: Negative for cough, shortness of breath and wheezing.   Gastrointestinal: Negative for diarrhea, nausea and vomiting.  Genitourinary: does not make urine  Musculoskeletal: Negative for back pain, joint swelling and neck pain.  Neurological: + dizziness, no speech difficulty, light-headedness and numbness.  See HPI. All other review of systems negative.     Objective:    Physical Exam  BP 138/82    Pulse 77    Ht 6' (1.829 m)    SpO2 96%    BMI 38.48 kg/m  Wt Readings from Last 3 Encounters:  06/23/19 283 lb 11.7 oz (128.7 kg)  06/15/19 274 lb 12.8 oz (124.6 kg)  06/13/19 278 lb 3.2 oz (126.2 kg)   Physical Exam  Constitutional: Oriented to person, place, and time. Appears well-developed and well-nourished.  HENT:  Head: Normocephalic and atraumatic.  Eyes: Conjunctivae and EOM are normal.  Cardiovascular: Normal rate, regular rhythm, normal heart sounds and intact distal pulses.  No murmur heard. Pulmonary/Chest: Effort normal and breath sounds normal. No stridor. No respiratory distress. Has no wheezes.  Neurological: Is alert and oriented to person, place, and time.  Skin: Skin is warm. Capillary refill takes less than 2 seconds.  Psychiatric: Has a normal mood and affect. Behavior is normal. Judgment and thought content normal.    Health Maintenance Due  Topic Date Due   LIPID PANEL  05/10/2018   FOOT  EXAM  02/15/2019   INFLUENZA VACCINE  04/01/2019    There are no preventive care reminders to display for this patient.  Lab Results  Component Value Date   TSH 2.57 06/16/2016   Lab Results  Component Value Date   WBC 7.5 06/23/2019   HGB 12.8 (L) 06/23/2019   HCT 40.4 06/23/2019   MCV 95.1 06/23/2019   PLT 104 (L) 06/23/2019   Lab Results  Component Value Date   NA 135 06/24/2019   K 5.4 (H) 06/24/2019   CO2 24 06/24/2019   GLUCOSE 135 (H)  06/24/2019   BUN 46 (H) 06/24/2019   CREATININE 9.54 (H) 06/24/2019   BILITOT 0.6 06/23/2019   ALKPHOS 111 06/23/2019   AST 20 06/23/2019   ALT 25 06/23/2019   PROT 6.2 (L) 06/23/2019   ALBUMIN 3.4 (L) 06/23/2019   CALCIUM 10.3 06/24/2019   ANIONGAP 18 (H) 06/24/2019   GFR 46.55 (L) 07/16/2015   Lab Results  Component Value Date   CHOL 175 05/10/2017   Lab Results  Component Value Date   HDL 35 (L) 05/10/2017   Lab Results  Component Value Date   LDLCALC 69 05/10/2017   Lab Results  Component Value Date   TRIG 353 (H) 05/10/2017   Lab Results  Component Value Date   CHOLHDL 5.0 05/10/2017   Lab Results  Component Value Date   HGBA1C 7.4 (A) 06/15/2019      Assessment & Plan:   Problem List Items Addressed This Visit    None    Visit Diagnoses    Hyperglycemia    -  Primary   Relevant Orders   POCT glucose (manual entry) (Completed)   POCT glucose (Completed)   ESRD on dialysis ([place])       Diabetes mellitus due to underlying condition, uncontrolled, with severe nonproliferative retinopathy, with long-term current use of insulin ([place])         Pt ws advised to go to the ER since the practice did not have regular insulin. He was monitored for one hour and his blood glucose increased but he seemed to improve after resting on the exam table.  Advised him against driving and offered to call paramedics Pt declined any intervention He had his dog in the car  He is aware of the risks and he was able to ambulate to his car. Pt arrived home and called in to notify team of his safe arrival. He will call in his sugar readings.   Meds ordered this encounter  Medications   DISCONTD: insulin regular (NOVOLIN R) 100 units/mL injection 10 Units    Follow-up: No follow-ups on file.   A total of 45 minutes were spent face-to-face with the patient during this encounter and over half of that time was spent on counseling and coordination of care.  Forrest Moron, MD

## 2019-07-11 NOTE — Telephone Encounter (Signed)
Copied from [place] [phone]. Topic: General - Other >> Jul 11, 2019 12:59 PM Oneta Rack wrote: Patient states he was advised by PCP to inform her when he arrived home safely.

## 2019-07-11 NOTE — Telephone Encounter (Signed)
Attempted to reach the patient but pt did not answer. Did not leave voicemail. Would like to know patient's blood glucose. Sent patient a mychart message to let us know his sugars.

## 2019-07-11 NOTE — Telephone Encounter (Signed)
Call returned to [place] with [employer], she states they do not have a machine that goes up to 30. She states he is at the max pressure and inquiring about further recommendations as far what they can provide for the patient. She states her manager looked into this and it appears [facility] does not provide/make a machine that accomodates a pressure above 25.   RA please advise. Thanks. See encounter 07/10/2019. Thanks.

## 2019-07-12 DIAGNOSIS — D689 Coagulation defect, unspecified: Secondary | ICD-10-CM | POA: Diagnosis not present

## 2019-07-12 DIAGNOSIS — G5710 Meralgia paresthetica, unspecified lower limb: Secondary | ICD-10-CM | POA: Diagnosis not present

## 2019-07-12 DIAGNOSIS — N2581 Secondary hyperparathyroidism of renal origin: Secondary | ICD-10-CM | POA: Diagnosis not present

## 2019-07-12 DIAGNOSIS — N186 End stage renal disease: Secondary | ICD-10-CM | POA: Diagnosis not present

## 2019-07-12 DIAGNOSIS — Z992 Dependence on renal dialysis: Secondary | ICD-10-CM | POA: Diagnosis not present

## 2019-07-12 DIAGNOSIS — E785 Hyperlipidemia, unspecified: Secondary | ICD-10-CM | POA: Diagnosis not present

## 2019-07-12 DIAGNOSIS — M47896 Other spondylosis, lumbar region: Secondary | ICD-10-CM | POA: Diagnosis not present

## 2019-07-12 DIAGNOSIS — E875 Hyperkalemia: Secondary | ICD-10-CM | POA: Diagnosis not present

## 2019-07-12 DIAGNOSIS — M4807 Spinal stenosis, lumbosacral region: Secondary | ICD-10-CM | POA: Diagnosis not present

## 2019-07-12 DIAGNOSIS — M545 Low back pain: Secondary | ICD-10-CM | POA: Diagnosis not present

## 2019-07-12 NOTE — Telephone Encounter (Signed)
I called and left Jerry Cantrell a voicemail explaining this. I advised her to call back if she needs anything from [place] as far as the order is concerned.

## 2019-07-12 NOTE — Telephone Encounter (Signed)
Okay to continue on current auto BiPAP settings with IPAP maximum of 25

## 2019-07-13 ENCOUNTER — Other Ambulatory Visit: Payer: Medicare Other

## 2019-07-14 ENCOUNTER — Telehealth: Payer: Self-pay | Admitting: Internal Medicine

## 2019-07-14 NOTE — Telephone Encounter (Signed)
Ed with [facility] ph# [phone] requests to be called re: status of receipt of request of office visit notes.

## 2019-07-14 NOTE — Telephone Encounter (Signed)
All paperwork received is completed in the order in which it is received. Once completed, it will be returned.

## 2019-07-17 DIAGNOSIS — E785 Hyperlipidemia, unspecified: Secondary | ICD-10-CM | POA: Diagnosis not present

## 2019-07-17 DIAGNOSIS — D689 Coagulation defect, unspecified: Secondary | ICD-10-CM | POA: Diagnosis not present

## 2019-07-17 DIAGNOSIS — M4807 Spinal stenosis, lumbosacral region: Secondary | ICD-10-CM | POA: Diagnosis not present

## 2019-07-17 DIAGNOSIS — E875 Hyperkalemia: Secondary | ICD-10-CM | POA: Diagnosis not present

## 2019-07-17 DIAGNOSIS — M545 Low back pain: Secondary | ICD-10-CM | POA: Diagnosis not present

## 2019-07-17 DIAGNOSIS — N2581 Secondary hyperparathyroidism of renal origin: Secondary | ICD-10-CM | POA: Diagnosis not present

## 2019-07-17 DIAGNOSIS — N186 End stage renal disease: Secondary | ICD-10-CM | POA: Diagnosis not present

## 2019-07-17 DIAGNOSIS — Z992 Dependence on renal dialysis: Secondary | ICD-10-CM | POA: Diagnosis not present

## 2019-07-17 DIAGNOSIS — G5710 Meralgia paresthetica, unspecified lower limb: Secondary | ICD-10-CM | POA: Diagnosis not present

## 2019-07-17 DIAGNOSIS — M47896 Other spondylosis, lumbar region: Secondary | ICD-10-CM | POA: Diagnosis not present

## 2019-07-18 ENCOUNTER — Other Ambulatory Visit: Payer: Self-pay

## 2019-07-18 ENCOUNTER — Ambulatory Visit
Admission: RE | Admit: 2019-07-18 | Discharge: 2019-07-18 | Disposition: A | Discharge status: Home / Self Care | Payer: Medicare Other | Source: Ambulatory Visit | Attending: Neurology | Admitting: Neurology

## 2019-07-18 ENCOUNTER — Other Ambulatory Visit: Payer: Medicare Other

## 2019-07-18 ENCOUNTER — Other Ambulatory Visit: Payer: Self-pay | Admitting: Family Medicine

## 2019-07-18 ENCOUNTER — Telehealth: Payer: Self-pay

## 2019-07-18 DIAGNOSIS — G3281 Cerebellar ataxia in diseases classified elsewhere: Secondary | ICD-10-CM

## 2019-07-18 DIAGNOSIS — E083499 Diabetes mellitus due to underlying condition with severe nonproliferative diabetic retinopathy without macular edema, unspecified eye: Secondary | ICD-10-CM

## 2019-07-18 DIAGNOSIS — IMO0002 Reserved for concepts with insufficient information to code with codable children: Secondary | ICD-10-CM

## 2019-07-18 DIAGNOSIS — R251 Tremor, unspecified: Secondary | ICD-10-CM

## 2019-07-18 DIAGNOSIS — R269 Unspecified abnormalities of gait and mobility: Secondary | ICD-10-CM

## 2019-07-18 DIAGNOSIS — M545 Low back pain, unspecified: Secondary | ICD-10-CM

## 2019-07-18 NOTE — Telephone Encounter (Signed)
Sent fax that stated for 6 month worth of records need to contact medical records. Provided the # [phone]

## 2019-07-18 NOTE — Telephone Encounter (Signed)
Called in requesting office notes for the last 6 months   Fax number - [phone]    [phone]  Carmela Rima

## 2019-07-19 DIAGNOSIS — M4807 Spinal stenosis, lumbosacral region: Secondary | ICD-10-CM | POA: Diagnosis not present

## 2019-07-19 DIAGNOSIS — N2581 Secondary hyperparathyroidism of renal origin: Secondary | ICD-10-CM | POA: Diagnosis not present

## 2019-07-19 DIAGNOSIS — Z992 Dependence on renal dialysis: Secondary | ICD-10-CM | POA: Diagnosis not present

## 2019-07-19 DIAGNOSIS — G5710 Meralgia paresthetica, unspecified lower limb: Secondary | ICD-10-CM | POA: Diagnosis not present

## 2019-07-19 DIAGNOSIS — E875 Hyperkalemia: Secondary | ICD-10-CM | POA: Diagnosis not present

## 2019-07-19 DIAGNOSIS — M47896 Other spondylosis, lumbar region: Secondary | ICD-10-CM | POA: Diagnosis not present

## 2019-07-19 DIAGNOSIS — M545 Low back pain: Secondary | ICD-10-CM | POA: Diagnosis not present

## 2019-07-19 DIAGNOSIS — E785 Hyperlipidemia, unspecified: Secondary | ICD-10-CM | POA: Diagnosis not present

## 2019-07-19 DIAGNOSIS — N186 End stage renal disease: Secondary | ICD-10-CM | POA: Diagnosis not present

## 2019-07-19 DIAGNOSIS — D689 Coagulation defect, unspecified: Secondary | ICD-10-CM | POA: Diagnosis not present

## 2019-07-20 ENCOUNTER — Other Ambulatory Visit: Payer: Self-pay | Admitting: *Deleted

## 2019-07-20 NOTE — Patient Outreach (Signed)
Referral received from nephrologist citing pt has missed dialysis appointments and behavioral issues, pt with diagnoses DM type 2, ESRD with dialysis M, W,F, COPD, tobacco and marijuana abuse, legally blind, cocaine use disorder, HTN, CHF, major depression chronic, chronic lower back pain secondary to obesity.  Outreach call to pt for screening, no answer to telephone and no option to leave voicemail,  RN CM mailed unsuccessful outreach letter to pt home.  PLAN Outreach pt in 3-4 business days  Jacqlyn Larsen [facility], [place] [phone]

## 2019-07-20 NOTE — Progress Notes (Signed)
Cardiology Office Note   Date:  07/21/2019   ID:  Abshir Moris, DOB March 18, 1966, MRN [id number]  PCP:  Forrest Moron, MD  Cardiologist:   Dorris Carnes, MD   Pt presents for f/u of HTN   History of Present Illness: Jerry Cantrell is a 53 y.o. male with a history of CAP and resp failue , sepsis , ARF, HTN, OSA, DM  Diastolic CHF, HL.   He is also followed by [facility]  Just seen   CT of chest ordered which was just done.  Using biPAP   Still smoking    The pt denies CP    Breathing is stable    Pt was seen by Dr Justin Mend  When I saw him I started amlodipine   This was discontinued for concerns of renal blood flow the pt said  Now on verapamil BP at home is usually 140s/90s    I saw the pt in 2019      Allergies:   Patient has no known allergies.   Past Medical History:  Diagnosis Date  . Acute diastolic heart failure ([place])   . CHF (congestive heart failure) ([place])   . Diabetes mellitus with nephropathy ([place]) 05/12/2012   Dr. Justin Mend follows - dialysis three times weekly  . Hemorrhoids 05/12/2012  . Hyperlipidemia   . Hypertension   . Lumbar spinal stenosis 05/03/2012   Mild with only right L4 nerve root encroachment, no neurogenic claudication   . Lumbar spondylosis 05/03/2012  . Meralgia paraesthetica 05/03/2012  . Meralgia paraesthetica 05/03/2012   On Lyrica which does improve pain.   Marland Kitchen Neuropathy in diabetes ([place]) 05/12/2012  . Obesity   . Pneumonia   . Retinopathy   . Sleep apnea    uses cpap  . Substance abuse ([place])   . Urinary incontinence 05/12/2012   Since the start of Sept. 2013     Past Surgical History:  Procedure Laterality Date  . ABDOMINAL SURGERY     Abscess I&D 2/2 infected hair  . AV FISTULA PLACEMENT Left 02/06/2016   Procedure: LEFT ARM RADIOCEPHALIC ARTERIOVENOUS (AV) FISTULA CREATION;  Surgeon: Rosetta Posner, MD;  Location: [facility];  Service: Vascular;  Laterality: Left;  . COLONOSCOPY W/ POLYPECTOMY     pt to bring records  . COLONOSCOPY WITH PROPOFOL N/A  06/05/2016   Procedure: COLONOSCOPY WITH PROPOFOL;  Surgeon: Doran Stabler, MD;  [facility];  Service: Gastroenterology;  Laterality: N/A;  . TONSILLECTOMY       Social History:  The patient  reports that he has been smoking cigarettes. He has a 15.00 pack-year smoking history. He has never used smokeless tobacco. He reports that he does not drink alcohol or use drugs.   Family History:  The patient's family history includes Alcohol abuse in his father; Diabetes in his sister; Emphysema in his father; Memory loss in his mother; Prostate cancer in his father.    ROS:  Please see the history of present illness. All other systems are reviewed and  Negative to the above problem except as noted.    PHYSICAL EXAM: VS:  BP 110/72   Pulse 87   Ht 6' (1.829 m)   Wt 272 lb 6.4 oz (123.6 kg)   SpO2 95%   BMI 36.94 kg/m   GEN: Morbidly obese 53 yo in no acute distress  HEENT: normal  Neck: JVP is normal No, carotid bruits, or masses Cardiac: RRR; no murmurs, rubs, or gallops,no edema  Respiratory:  clear to auscultation bilaterally, normal work of breathing GI: soft, nontender, nondistended, + BS  No hepatomegaly  MS: no deformity Moving all extremities   Skin: warm and dry, no rash Neuro:  Strength and sensation are intact Psych: euthymic mood, full affect   EKG:  EKG is ordered today.  SR   First degree AV block   PR interval 230 msec   Septal infarct    Lipid Panel    Component Value Date/Time   CHOL 175 05/10/2017 1244   TRIG 353 (H) 05/10/2017 1244   HDL 35 (L) 05/10/2017 1244   CHOLHDL 5.0 05/10/2017 1244   CHOLHDL 6.3 (H) 06/16/2016 1147   VLDL NOT CALC 06/16/2016 1147   LDLCALC 69 05/10/2017 1244   LDLDIRECT 99 09/16/2010 2130      Wt Readings from Last 3 Encounters:  07/21/19 272 lb 6.4 oz (123.6 kg)  06/23/19 283 lb 11.7 oz (128.7 kg)  06/15/19 274 lb 12.8 oz (124.6 kg)      ASSESSMENT AND PLAN:  1  HTN  BP is still not well controlled   Being  seen in renal clnic   I will defer management to Dr Justin Mend.  2  Chronic diastolic CHF  Volume status apppears OK    3  HL  Continue statin LDL in Sept 2018 was 69  4   Tob   Needs to stop smoking   Increaese activity Cut back on ASA to 81 mg perday    Current medicines are reviewed at length with the patient today.  The patient does not have concerns regarding medicines.  Signed, Dorris Carnes, MD  07/21/2019 4:09 PM    [facility], [place], [place]  [address] Phone: [phone]; Fax: [phone]

## 2019-07-21 ENCOUNTER — Other Ambulatory Visit: Payer: Self-pay

## 2019-07-21 ENCOUNTER — Encounter: Payer: Self-pay | Admitting: Internal Medicine

## 2019-07-21 ENCOUNTER — Ambulatory Visit (INDEPENDENT_AMBULATORY_CARE_PROVIDER_SITE_OTHER): Payer: Medicare Other | Admitting: Internal Medicine

## 2019-07-21 VITALS — BP 110/72 | HR 87 | Ht 72.0 in | Wt 272.4 lb

## 2019-07-21 DIAGNOSIS — I5043 Acute on chronic combined systolic (congestive) and diastolic (congestive) heart failure: Secondary | ICD-10-CM

## 2019-07-21 DIAGNOSIS — E782 Mixed hyperlipidemia: Secondary | ICD-10-CM

## 2019-07-21 DIAGNOSIS — M4807 Spinal stenosis, lumbosacral region: Secondary | ICD-10-CM | POA: Diagnosis not present

## 2019-07-21 DIAGNOSIS — E785 Hyperlipidemia, unspecified: Secondary | ICD-10-CM | POA: Diagnosis not present

## 2019-07-21 DIAGNOSIS — M545 Low back pain: Secondary | ICD-10-CM | POA: Diagnosis not present

## 2019-07-21 DIAGNOSIS — M47896 Other spondylosis, lumbar region: Secondary | ICD-10-CM | POA: Diagnosis not present

## 2019-07-21 DIAGNOSIS — Z992 Dependence on renal dialysis: Secondary | ICD-10-CM | POA: Diagnosis not present

## 2019-07-21 DIAGNOSIS — I10 Essential (primary) hypertension: Secondary | ICD-10-CM | POA: Diagnosis not present

## 2019-07-21 DIAGNOSIS — E875 Hyperkalemia: Secondary | ICD-10-CM | POA: Diagnosis not present

## 2019-07-21 DIAGNOSIS — N186 End stage renal disease: Secondary | ICD-10-CM | POA: Diagnosis not present

## 2019-07-21 DIAGNOSIS — D689 Coagulation defect, unspecified: Secondary | ICD-10-CM | POA: Diagnosis not present

## 2019-07-21 DIAGNOSIS — N2581 Secondary hyperparathyroidism of renal origin: Secondary | ICD-10-CM | POA: Diagnosis not present

## 2019-07-21 DIAGNOSIS — G5710 Meralgia paresthetica, unspecified lower limb: Secondary | ICD-10-CM | POA: Diagnosis not present

## 2019-07-21 NOTE — Patient Instructions (Signed)
Medication Instructions:  No changes *If you need a refill on your cardiac medications before your next appointment, please call your pharmacy*  Lab Work: none If you have labs (blood work) drawn today and your tests are completely normal, you will receive your results only by: Marland Kitchen MyChart Message (if you have MyChart) OR . A paper copy in the mail If you have any lab test that is abnormal or we need to change your treatment, we will call you to review the results.  Testing/Procedures: none  Follow-Up: At [facility], you and your health needs are our priority.  As part of our continuing mission to provide you with exceptional heart care, we have created designated Provider Care Teams.  These Care Teams include your primary Cardiologist (physician) and Advanced Practice Providers (APPs -  Physician Assistants and Nurse Practitioners) who all work together to provide you with the care you need, when you need it.  Your next appointment:   8 month(s)  The format for your next appointment:   In Person  Provider:   Dorris Carnes, MD  Other Instructions

## 2019-07-22 DIAGNOSIS — Z794 Long term (current) use of insulin: Secondary | ICD-10-CM | POA: Diagnosis not present

## 2019-07-22 DIAGNOSIS — E1122 Type 2 diabetes mellitus with diabetic chronic kidney disease: Secondary | ICD-10-CM | POA: Diagnosis not present

## 2019-07-23 DIAGNOSIS — E785 Hyperlipidemia, unspecified: Secondary | ICD-10-CM | POA: Diagnosis not present

## 2019-07-23 DIAGNOSIS — Z992 Dependence on renal dialysis: Secondary | ICD-10-CM | POA: Diagnosis not present

## 2019-07-23 DIAGNOSIS — D689 Coagulation defect, unspecified: Secondary | ICD-10-CM | POA: Diagnosis not present

## 2019-07-23 DIAGNOSIS — N186 End stage renal disease: Secondary | ICD-10-CM | POA: Diagnosis not present

## 2019-07-23 DIAGNOSIS — M4807 Spinal stenosis, lumbosacral region: Secondary | ICD-10-CM | POA: Diagnosis not present

## 2019-07-23 DIAGNOSIS — G5710 Meralgia paresthetica, unspecified lower limb: Secondary | ICD-10-CM | POA: Diagnosis not present

## 2019-07-23 DIAGNOSIS — E875 Hyperkalemia: Secondary | ICD-10-CM | POA: Diagnosis not present

## 2019-07-23 DIAGNOSIS — M545 Low back pain: Secondary | ICD-10-CM | POA: Diagnosis not present

## 2019-07-23 DIAGNOSIS — N2581 Secondary hyperparathyroidism of renal origin: Secondary | ICD-10-CM | POA: Diagnosis not present

## 2019-07-23 DIAGNOSIS — M47896 Other spondylosis, lumbar region: Secondary | ICD-10-CM | POA: Diagnosis not present

## 2019-07-24 ENCOUNTER — Telehealth: Payer: Self-pay

## 2019-07-24 DIAGNOSIS — G4733 Obstructive sleep apnea (adult) (pediatric): Secondary | ICD-10-CM | POA: Diagnosis not present

## 2019-07-24 NOTE — Telephone Encounter (Signed)
Fax received from [facility] with approval for G6 receiver stated it will be shipped in 3-5 days

## 2019-07-25 ENCOUNTER — Other Ambulatory Visit: Payer: Self-pay | Admitting: *Deleted

## 2019-07-25 DIAGNOSIS — M5432 Sciatica, left side: Secondary | ICD-10-CM | POA: Diagnosis not present

## 2019-07-25 DIAGNOSIS — G4733 Obstructive sleep apnea (adult) (pediatric): Secondary | ICD-10-CM | POA: Diagnosis not present

## 2019-07-25 DIAGNOSIS — M545 Low back pain: Secondary | ICD-10-CM | POA: Diagnosis not present

## 2019-07-25 DIAGNOSIS — G890 Central pain syndrome: Secondary | ICD-10-CM | POA: Diagnosis not present

## 2019-07-25 DIAGNOSIS — M5431 Sciatica, right side: Secondary | ICD-10-CM | POA: Diagnosis not present

## 2019-07-25 NOTE — Patient Outreach (Signed)
Outreach call to pt for screening (2nd attempt), no answer to telephone and no option to leave voicemail.  PLAN Outreach pt in 3-4 business days  Cheyrl Buley RNC, BSN [facility] Coordinator [phone]   

## 2019-07-26 ENCOUNTER — Telehealth: Payer: Self-pay | Admitting: Internal Medicine

## 2019-07-26 DIAGNOSIS — N186 End stage renal disease: Secondary | ICD-10-CM | POA: Diagnosis not present

## 2019-07-26 DIAGNOSIS — E875 Hyperkalemia: Secondary | ICD-10-CM | POA: Diagnosis not present

## 2019-07-26 DIAGNOSIS — N2581 Secondary hyperparathyroidism of renal origin: Secondary | ICD-10-CM | POA: Diagnosis not present

## 2019-07-26 DIAGNOSIS — Z992 Dependence on renal dialysis: Secondary | ICD-10-CM | POA: Diagnosis not present

## 2019-07-26 DIAGNOSIS — G5710 Meralgia paresthetica, unspecified lower limb: Secondary | ICD-10-CM | POA: Diagnosis not present

## 2019-07-26 DIAGNOSIS — M47896 Other spondylosis, lumbar region: Secondary | ICD-10-CM | POA: Diagnosis not present

## 2019-07-26 DIAGNOSIS — M4807 Spinal stenosis, lumbosacral region: Secondary | ICD-10-CM | POA: Diagnosis not present

## 2019-07-26 DIAGNOSIS — E785 Hyperlipidemia, unspecified: Secondary | ICD-10-CM | POA: Diagnosis not present

## 2019-07-26 DIAGNOSIS — M545 Low back pain: Secondary | ICD-10-CM | POA: Diagnosis not present

## 2019-07-26 DIAGNOSIS — D689 Coagulation defect, unspecified: Secondary | ICD-10-CM | POA: Diagnosis not present

## 2019-07-26 NOTE — Telephone Encounter (Signed)
Spoke to pt and gave him the # to call so that he can schedule an appt with Vaughan Basta upon her return.

## 2019-07-26 NOTE — Telephone Encounter (Signed)
Pt is calling to set up an appt with Vaughan Basta for training of the [place] he has now received it from [facility]

## 2019-07-28 ENCOUNTER — Ambulatory Visit: Payer: Medicare Other | Admitting: Adult Health

## 2019-07-28 DIAGNOSIS — M47896 Other spondylosis, lumbar region: Secondary | ICD-10-CM | POA: Diagnosis not present

## 2019-07-28 DIAGNOSIS — G5710 Meralgia paresthetica, unspecified lower limb: Secondary | ICD-10-CM | POA: Diagnosis not present

## 2019-07-28 DIAGNOSIS — D689 Coagulation defect, unspecified: Secondary | ICD-10-CM | POA: Diagnosis not present

## 2019-07-28 DIAGNOSIS — Z992 Dependence on renal dialysis: Secondary | ICD-10-CM | POA: Diagnosis not present

## 2019-07-28 DIAGNOSIS — M545 Low back pain: Secondary | ICD-10-CM | POA: Diagnosis not present

## 2019-07-28 DIAGNOSIS — E785 Hyperlipidemia, unspecified: Secondary | ICD-10-CM | POA: Diagnosis not present

## 2019-07-28 DIAGNOSIS — N2581 Secondary hyperparathyroidism of renal origin: Secondary | ICD-10-CM | POA: Diagnosis not present

## 2019-07-28 DIAGNOSIS — M4807 Spinal stenosis, lumbosacral region: Secondary | ICD-10-CM | POA: Diagnosis not present

## 2019-07-28 DIAGNOSIS — N186 End stage renal disease: Secondary | ICD-10-CM | POA: Diagnosis not present

## 2019-07-28 DIAGNOSIS — E875 Hyperkalemia: Secondary | ICD-10-CM | POA: Diagnosis not present

## 2019-07-29 DIAGNOSIS — E785 Hyperlipidemia, unspecified: Secondary | ICD-10-CM | POA: Diagnosis not present

## 2019-07-29 DIAGNOSIS — E8779 Other fluid overload: Secondary | ICD-10-CM | POA: Diagnosis not present

## 2019-07-29 DIAGNOSIS — D689 Coagulation defect, unspecified: Secondary | ICD-10-CM | POA: Diagnosis not present

## 2019-07-29 DIAGNOSIS — M47896 Other spondylosis, lumbar region: Secondary | ICD-10-CM | POA: Diagnosis not present

## 2019-07-29 DIAGNOSIS — M4807 Spinal stenosis, lumbosacral region: Secondary | ICD-10-CM | POA: Diagnosis not present

## 2019-07-29 DIAGNOSIS — N2581 Secondary hyperparathyroidism of renal origin: Secondary | ICD-10-CM | POA: Diagnosis not present

## 2019-07-29 DIAGNOSIS — N186 End stage renal disease: Secondary | ICD-10-CM | POA: Diagnosis not present

## 2019-07-29 DIAGNOSIS — Z992 Dependence on renal dialysis: Secondary | ICD-10-CM | POA: Diagnosis not present

## 2019-07-29 DIAGNOSIS — E875 Hyperkalemia: Secondary | ICD-10-CM | POA: Diagnosis not present

## 2019-07-29 DIAGNOSIS — M545 Low back pain: Secondary | ICD-10-CM | POA: Diagnosis not present

## 2019-07-29 DIAGNOSIS — G5710 Meralgia paresthetica, unspecified lower limb: Secondary | ICD-10-CM | POA: Diagnosis not present

## 2019-07-31 ENCOUNTER — Telehealth: Payer: Self-pay | Admitting: Nutrition

## 2019-07-31 ENCOUNTER — Other Ambulatory Visit: Payer: Self-pay

## 2019-07-31 ENCOUNTER — Ambulatory Visit (INDEPENDENT_AMBULATORY_CARE_PROVIDER_SITE_OTHER): Payer: Medicare Other | Admitting: Neurology

## 2019-07-31 DIAGNOSIS — E785 Hyperlipidemia, unspecified: Secondary | ICD-10-CM | POA: Diagnosis not present

## 2019-07-31 DIAGNOSIS — M47896 Other spondylosis, lumbar region: Secondary | ICD-10-CM | POA: Diagnosis not present

## 2019-07-31 DIAGNOSIS — D689 Coagulation defect, unspecified: Secondary | ICD-10-CM | POA: Diagnosis not present

## 2019-07-31 DIAGNOSIS — N186 End stage renal disease: Secondary | ICD-10-CM | POA: Diagnosis not present

## 2019-07-31 DIAGNOSIS — E1122 Type 2 diabetes mellitus with diabetic chronic kidney disease: Secondary | ICD-10-CM | POA: Diagnosis not present

## 2019-07-31 DIAGNOSIS — R251 Tremor, unspecified: Secondary | ICD-10-CM

## 2019-07-31 DIAGNOSIS — G5710 Meralgia paresthetica, unspecified lower limb: Secondary | ICD-10-CM | POA: Diagnosis not present

## 2019-07-31 DIAGNOSIS — R269 Unspecified abnormalities of gait and mobility: Secondary | ICD-10-CM

## 2019-07-31 DIAGNOSIS — N2581 Secondary hyperparathyroidism of renal origin: Secondary | ICD-10-CM | POA: Diagnosis not present

## 2019-07-31 DIAGNOSIS — M545 Low back pain, unspecified: Secondary | ICD-10-CM

## 2019-07-31 DIAGNOSIS — E875 Hyperkalemia: Secondary | ICD-10-CM | POA: Diagnosis not present

## 2019-07-31 DIAGNOSIS — Z992 Dependence on renal dialysis: Secondary | ICD-10-CM | POA: Diagnosis not present

## 2019-07-31 DIAGNOSIS — M4807 Spinal stenosis, lumbosacral region: Secondary | ICD-10-CM | POA: Diagnosis not present

## 2019-07-31 DIAGNOSIS — G4733 Obstructive sleep apnea (adult) (pediatric): Secondary | ICD-10-CM | POA: Diagnosis not present

## 2019-07-31 DIAGNOSIS — G3281 Cerebellar ataxia in diseases classified elsewhere: Secondary | ICD-10-CM

## 2019-07-31 NOTE — Telephone Encounter (Signed)
See previous note.  Appointment scheduled for tomorrow for [facility] training

## 2019-08-01 ENCOUNTER — Other Ambulatory Visit: Payer: Self-pay | Admitting: *Deleted

## 2019-08-01 ENCOUNTER — Encounter: Payer: Medicare Other | Attending: Internal Medicine | Admitting: Nutrition

## 2019-08-01 ENCOUNTER — Other Ambulatory Visit: Payer: Self-pay

## 2019-08-01 DIAGNOSIS — N186 End stage renal disease: Secondary | ICD-10-CM | POA: Diagnosis not present

## 2019-08-01 DIAGNOSIS — I871 Compression of vein: Secondary | ICD-10-CM | POA: Diagnosis not present

## 2019-08-01 DIAGNOSIS — E1165 Type 2 diabetes mellitus with hyperglycemia: Secondary | ICD-10-CM | POA: Diagnosis not present

## 2019-08-01 DIAGNOSIS — Z794 Long term (current) use of insulin: Secondary | ICD-10-CM | POA: Diagnosis not present

## 2019-08-01 DIAGNOSIS — IMO0002 Reserved for concepts with insufficient information to code with codable children: Secondary | ICD-10-CM

## 2019-08-01 DIAGNOSIS — Z992 Dependence on renal dialysis: Secondary | ICD-10-CM | POA: Diagnosis not present

## 2019-08-01 NOTE — Patient Outreach (Signed)
Outreach call to pt for screening/  3rd attempt, no answer to telephone, left voicemail requesting return phone call.  PLAN Close case in 3-4 business days if no response from pt  Jacqlyn Larsen [facility], [place] Coordinator [phone]

## 2019-08-01 NOTE — Progress Notes (Signed)
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  He was shown how to insert the sensor, attach the transmitter, and start the sensor.  We linked his transmitter to the hand held device.  He was not able to set up the Dexcom G6 mobil app because his phone was too old.  We discussed the difference between sensor readings and blood sugar readings.  He reported good understanding of this and had no final questions.

## 2019-08-01 NOTE — Patient Instructions (Signed)
Call [employer] to set up your G6 mobil app.

## 2019-08-02 DIAGNOSIS — D689 Coagulation defect, unspecified: Secondary | ICD-10-CM | POA: Diagnosis not present

## 2019-08-02 DIAGNOSIS — D631 Anemia in chronic kidney disease: Secondary | ICD-10-CM | POA: Diagnosis not present

## 2019-08-02 DIAGNOSIS — Z992 Dependence on renal dialysis: Secondary | ICD-10-CM | POA: Diagnosis not present

## 2019-08-02 DIAGNOSIS — D509 Iron deficiency anemia, unspecified: Secondary | ICD-10-CM | POA: Diagnosis not present

## 2019-08-02 DIAGNOSIS — E875 Hyperkalemia: Secondary | ICD-10-CM | POA: Diagnosis not present

## 2019-08-02 DIAGNOSIS — R1084 Generalized abdominal pain: Secondary | ICD-10-CM | POA: Diagnosis not present

## 2019-08-02 DIAGNOSIS — N186 End stage renal disease: Secondary | ICD-10-CM | POA: Diagnosis not present

## 2019-08-02 DIAGNOSIS — N2581 Secondary hyperparathyroidism of renal origin: Secondary | ICD-10-CM | POA: Diagnosis not present

## 2019-08-03 DIAGNOSIS — D689 Coagulation defect, unspecified: Secondary | ICD-10-CM | POA: Diagnosis not present

## 2019-08-03 DIAGNOSIS — Z992 Dependence on renal dialysis: Secondary | ICD-10-CM | POA: Diagnosis not present

## 2019-08-03 DIAGNOSIS — N186 End stage renal disease: Secondary | ICD-10-CM | POA: Diagnosis not present

## 2019-08-03 DIAGNOSIS — D509 Iron deficiency anemia, unspecified: Secondary | ICD-10-CM | POA: Diagnosis not present

## 2019-08-03 DIAGNOSIS — E8779 Other fluid overload: Secondary | ICD-10-CM | POA: Diagnosis not present

## 2019-08-03 DIAGNOSIS — R1084 Generalized abdominal pain: Secondary | ICD-10-CM | POA: Diagnosis not present

## 2019-08-03 DIAGNOSIS — N2581 Secondary hyperparathyroidism of renal origin: Secondary | ICD-10-CM | POA: Diagnosis not present

## 2019-08-03 DIAGNOSIS — E875 Hyperkalemia: Secondary | ICD-10-CM | POA: Diagnosis not present

## 2019-08-03 DIAGNOSIS — D631 Anemia in chronic kidney disease: Secondary | ICD-10-CM | POA: Diagnosis not present

## 2019-08-04 ENCOUNTER — Other Ambulatory Visit: Payer: Self-pay | Admitting: *Deleted

## 2019-08-04 DIAGNOSIS — R1084 Generalized abdominal pain: Secondary | ICD-10-CM | POA: Diagnosis not present

## 2019-08-04 DIAGNOSIS — D509 Iron deficiency anemia, unspecified: Secondary | ICD-10-CM | POA: Diagnosis not present

## 2019-08-04 DIAGNOSIS — Z992 Dependence on renal dialysis: Secondary | ICD-10-CM | POA: Diagnosis not present

## 2019-08-04 DIAGNOSIS — D689 Coagulation defect, unspecified: Secondary | ICD-10-CM | POA: Diagnosis not present

## 2019-08-04 DIAGNOSIS — N2581 Secondary hyperparathyroidism of renal origin: Secondary | ICD-10-CM | POA: Diagnosis not present

## 2019-08-04 DIAGNOSIS — N186 End stage renal disease: Secondary | ICD-10-CM | POA: Diagnosis not present

## 2019-08-04 DIAGNOSIS — E875 Hyperkalemia: Secondary | ICD-10-CM | POA: Diagnosis not present

## 2019-08-04 DIAGNOSIS — D631 Anemia in chronic kidney disease: Secondary | ICD-10-CM | POA: Diagnosis not present

## 2019-08-04 NOTE — Patient Outreach (Signed)
Unable to contact pt,  Case closed,  RN CM faxed case closure letter to primary MD and nephrologist Dr. Marval Regal (referring doctor).    Jacqlyn Larsen [facility], [place] Coordinator [phone]

## 2019-08-07 DIAGNOSIS — N186 End stage renal disease: Secondary | ICD-10-CM | POA: Diagnosis not present

## 2019-08-07 DIAGNOSIS — E875 Hyperkalemia: Secondary | ICD-10-CM | POA: Diagnosis not present

## 2019-08-07 DIAGNOSIS — R1084 Generalized abdominal pain: Secondary | ICD-10-CM | POA: Diagnosis not present

## 2019-08-07 DIAGNOSIS — D631 Anemia in chronic kidney disease: Secondary | ICD-10-CM | POA: Diagnosis not present

## 2019-08-07 DIAGNOSIS — D689 Coagulation defect, unspecified: Secondary | ICD-10-CM | POA: Diagnosis not present

## 2019-08-07 DIAGNOSIS — D509 Iron deficiency anemia, unspecified: Secondary | ICD-10-CM | POA: Diagnosis not present

## 2019-08-07 DIAGNOSIS — N2581 Secondary hyperparathyroidism of renal origin: Secondary | ICD-10-CM | POA: Diagnosis not present

## 2019-08-07 DIAGNOSIS — Z992 Dependence on renal dialysis: Secondary | ICD-10-CM | POA: Diagnosis not present

## 2019-08-09 ENCOUNTER — Other Ambulatory Visit: Payer: Self-pay | Admitting: Family Medicine

## 2019-08-09 DIAGNOSIS — D689 Coagulation defect, unspecified: Secondary | ICD-10-CM | POA: Diagnosis not present

## 2019-08-09 DIAGNOSIS — N186 End stage renal disease: Secondary | ICD-10-CM | POA: Diagnosis not present

## 2019-08-09 DIAGNOSIS — D509 Iron deficiency anemia, unspecified: Secondary | ICD-10-CM | POA: Diagnosis not present

## 2019-08-09 DIAGNOSIS — R1084 Generalized abdominal pain: Secondary | ICD-10-CM | POA: Diagnosis not present

## 2019-08-09 DIAGNOSIS — D631 Anemia in chronic kidney disease: Secondary | ICD-10-CM | POA: Diagnosis not present

## 2019-08-09 DIAGNOSIS — E875 Hyperkalemia: Secondary | ICD-10-CM | POA: Diagnosis not present

## 2019-08-09 DIAGNOSIS — N2581 Secondary hyperparathyroidism of renal origin: Secondary | ICD-10-CM | POA: Diagnosis not present

## 2019-08-09 DIAGNOSIS — Z992 Dependence on renal dialysis: Secondary | ICD-10-CM | POA: Diagnosis not present

## 2019-08-10 ENCOUNTER — Telehealth: Payer: Self-pay | Admitting: Neurology

## 2019-08-10 DIAGNOSIS — L0889 Other specified local infections of the skin and subcutaneous tissue: Secondary | ICD-10-CM | POA: Diagnosis not present

## 2019-08-10 DIAGNOSIS — L309 Dermatitis, unspecified: Secondary | ICD-10-CM | POA: Diagnosis not present

## 2019-08-10 DIAGNOSIS — L011 Impetiginization of other dermatoses: Secondary | ICD-10-CM | POA: Diagnosis not present

## 2019-08-10 NOTE — Telephone Encounter (Signed)
Pt called wanting to know what the update is on his CT Scan and EEG results. Please advise.

## 2019-08-10 NOTE — Telephone Encounter (Addendum)
Dr. Krista Blue emailed him his MRI results for brain and lumbar spine on 07/20/2019.  Says he was not able to access mychart at that time.  The results were provided to him again.  He also wanted his EEG results.  He has an appt scheduled on 08/14/2019 and stated it would be okay for her to review those with him then.

## 2019-08-10 NOTE — Procedures (Signed)
   HISTORY: 53 year old male, with episode of shaking  TECHNIQUE:  This is a routine 16 channel EEG recording with one channel devoted to a limited EKG recording.  It was performed during wakefulness, drowsiness and asleep.  Hyperventilation and photic stimulation were performed as activating procedures.  There are minimum muscle and movement artifact noted.  Upon maximum arousal, posterior dominant waking rhythm consistent of mildly dysrhythmic theta range activity with frequency of 6 to 8 Hz, activities are symmetric over the bilateral posterior derivations and attenuated with eye opening.  Hyperventilation produced mild/moderate buildup with higher amplitude and the slower activities noted.  Photic stimulation did not alter the tracing.  During EEG recording, patient developed drowsiness and no deeper stage of sleep was achieved During EEG recording, there was no epileptiform discharge noted.  EKG demonstrate sinus rhythm, with heart rate of 78 bpm.  CONCLUSION: This is an abnormal awake EEG.  There is evidence of mild background slowing, consistent with mild bihemispheric malfunction. Common etiology are metabolic toxic.  Marcial Pacas, M.D. Ph.D.  [facility] [place], [place] [address] Phone: [phone] Fax:      [phone]

## 2019-08-12 DIAGNOSIS — N2581 Secondary hyperparathyroidism of renal origin: Secondary | ICD-10-CM | POA: Diagnosis not present

## 2019-08-12 DIAGNOSIS — D631 Anemia in chronic kidney disease: Secondary | ICD-10-CM | POA: Diagnosis not present

## 2019-08-12 DIAGNOSIS — D509 Iron deficiency anemia, unspecified: Secondary | ICD-10-CM | POA: Diagnosis not present

## 2019-08-12 DIAGNOSIS — D689 Coagulation defect, unspecified: Secondary | ICD-10-CM | POA: Diagnosis not present

## 2019-08-12 DIAGNOSIS — Z992 Dependence on renal dialysis: Secondary | ICD-10-CM | POA: Diagnosis not present

## 2019-08-12 DIAGNOSIS — N186 End stage renal disease: Secondary | ICD-10-CM | POA: Diagnosis not present

## 2019-08-12 DIAGNOSIS — R1084 Generalized abdominal pain: Secondary | ICD-10-CM | POA: Diagnosis not present

## 2019-08-12 DIAGNOSIS — E875 Hyperkalemia: Secondary | ICD-10-CM | POA: Diagnosis not present

## 2019-08-14 ENCOUNTER — Ambulatory Visit (INDEPENDENT_AMBULATORY_CARE_PROVIDER_SITE_OTHER): Payer: Medicare Other | Admitting: Acute Care

## 2019-08-14 ENCOUNTER — Ambulatory Visit (INDEPENDENT_AMBULATORY_CARE_PROVIDER_SITE_OTHER): Payer: Medicare Other | Admitting: Neurology

## 2019-08-14 ENCOUNTER — Encounter: Payer: Self-pay | Admitting: Acute Care

## 2019-08-14 ENCOUNTER — Other Ambulatory Visit: Payer: Self-pay

## 2019-08-14 ENCOUNTER — Telehealth: Payer: Self-pay | Admitting: Pulmonary Disease

## 2019-08-14 ENCOUNTER — Encounter: Payer: Self-pay | Admitting: Neurology

## 2019-08-14 VITALS — BP 111/69 | HR 85 | Temp 96.3°F

## 2019-08-14 DIAGNOSIS — Z9989 Dependence on other enabling machines and devices: Secondary | ICD-10-CM

## 2019-08-14 DIAGNOSIS — R269 Unspecified abnormalities of gait and mobility: Secondary | ICD-10-CM | POA: Diagnosis not present

## 2019-08-14 DIAGNOSIS — E559 Vitamin D deficiency, unspecified: Secondary | ICD-10-CM | POA: Diagnosis not present

## 2019-08-14 DIAGNOSIS — M545 Low back pain, unspecified: Secondary | ICD-10-CM

## 2019-08-14 DIAGNOSIS — E1122 Type 2 diabetes mellitus with diabetic chronic kidney disease: Secondary | ICD-10-CM | POA: Diagnosis not present

## 2019-08-14 DIAGNOSIS — Z79899 Other long term (current) drug therapy: Secondary | ICD-10-CM | POA: Diagnosis not present

## 2019-08-14 DIAGNOSIS — G4733 Obstructive sleep apnea (adult) (pediatric): Secondary | ICD-10-CM

## 2019-08-14 DIAGNOSIS — M79604 Pain in right leg: Secondary | ICD-10-CM | POA: Diagnosis not present

## 2019-08-14 DIAGNOSIS — R251 Tremor, unspecified: Secondary | ICD-10-CM | POA: Diagnosis not present

## 2019-08-14 DIAGNOSIS — M79605 Pain in left leg: Secondary | ICD-10-CM | POA: Diagnosis not present

## 2019-08-14 DIAGNOSIS — R7989 Other specified abnormal findings of blood chemistry: Secondary | ICD-10-CM | POA: Diagnosis not present

## 2019-08-14 DIAGNOSIS — R799 Abnormal finding of blood chemistry, unspecified: Secondary | ICD-10-CM | POA: Diagnosis not present

## 2019-08-14 DIAGNOSIS — Z992 Dependence on renal dialysis: Secondary | ICD-10-CM | POA: Diagnosis not present

## 2019-08-14 DIAGNOSIS — E538 Deficiency of other specified B group vitamins: Secondary | ICD-10-CM | POA: Diagnosis not present

## 2019-08-14 DIAGNOSIS — N186 End stage renal disease: Secondary | ICD-10-CM | POA: Diagnosis not present

## 2019-08-14 MED ORDER — DULOXETINE HCL 30 MG PO CPEP: 30.0000 mg | ORAL_CAPSULE | Freq: Every day | ORAL | 11 refills | Status: AC

## 2019-08-14 MED ORDER — DULOXETINE HCL 30 MG PO CPEP: 30.0000 mg | ORAL_CAPSULE | Freq: Every day | ORAL | 11 refills | Status: DC

## 2019-08-14 NOTE — Progress Notes (Signed)
PATIENT: Jerry Cantrell DOB: 05/02/1966  Chief Complaint  Patient presents with  . Gait Abnormality/Shaking    He would like to review his MRI scans (lumbar, brain) and EEG results.     HISTORICAL  Jerry Cantrell is a 53 year old male, seen in request by his primary care physician Dr. Nolon Rod, Gwendolyn Fill for evaluation of episodes of shaking, initial evaluation was on June 21, 2019.  I have reviewed and summarized the referring note from the referring physician.  He has past medical history of hyperlipidemia, diabetes, hypertension, end-stage renal disease, hemodialysis dependent since June 2020, receiving hemodialysis Monday Wednesday Friday, also a longtime smoker.  He drove himself to clinic today, but has gait abnormality, has to be wheeled in by wheelchair.  He is on polypharmacy treatment, this include pain medication Lyrica 100 mg twice a day, oxycodone 10 mg 4 times a day, prolonged treatment of fluconazole 200 mg weekly for fungus infection toenail, was treated with antibiotic for 1 month for skin infection in her left heel, was just stopped 2 weeks ago  Around September 2020, he began to have intermittent body shaking episode, he denied loss of consciousness, usually started by bilateral upper extremity uncontrollable large amplitude shaking, difficulty holding utensils, when this happened, he would have trouble walking, bilateral lower extremity shaking as well, can last 15 minutes up to 4 hours, he felt exhausted afterwards, but no loss of consciousness  I was able to witnessed bilateral upper extremity shaking during interview, there is asterixis  Also complains of significant low back pain, bilateral lower extremity numbness to mid thigh level, gait abnormality,  I personally reviewed CT head on June 29, 2018, there was no acute abnormality, mild diffuse cerebral and cerebellar atrophy supratentorium small vessel disease chronic sinusitis  Laboratory evaluations showed A1c of  7.4, CBC with hemoglobin of 11.2, BMP, with creatinine of 8.5  UPDATE Aug 14 2019: He was brought in by his friend, alone at today's clinical visit sitting in wheelchair, complains of feeling bad, generalized weakness, difficulty breathing  He has been receiving hemodialysis, reported last hemodialysis was August 12, 2019  I have personally reviewed MRI of the brain on July 18, 2019, there was no acute abnormalities.  MRI of lumbar showed multilevel degenerative changes, at L4-5, there was disc bulging, facet hypertrophy with moderate by foraminal narrowing  EEG on July 31, 2019 showed mild background slowing  REVIEW OF SYSTEMS: Full 14 system review of systems performed and notable only for as above All other review of systems were negative.  ALLERGIES: No Known Allergies  HOME MEDICATIONS: Current Outpatient Medications  Medication Sig Dispense Refill  . ACCU-CHEK AVIVA PLUS test strip USE AS DIRECTED TO CHECK BLOOD SUGAR 5 TIMES A DAY BEFORE MEALS AND AS NEEDED. 400 each 0  . Accu-Chek Softclix Lancets lancets Use as instructed 450 each 0  . atorvastatin (LIPITOR) 80 MG tablet Take 1 tablet (80 mg total) by mouth daily at 6 PM. 90 tablet 0  . AURYXIA 1 GM 210 MG(Fe) tablet Take 210 mg by mouth 3 (three) times daily with meals.     . bacitracin-polymyxin b (POLYSPORIN) ointment Apply topically 2 (two) times daily. 15 g 0  . carvedilol (COREG) 25 MG tablet Take 25 mg by mouth 2 (two) times daily with a meal.    . cetirizine (ZYRTEC) 10 MG tablet TAKE ONE TABLET BY MOUTH ONCE DAILY FOR ALLERGIES 90 tablet 0  . cinacalcet (SENSIPAR) 60 MG tablet Take 60 mg by mouth  daily.    . clobetasol cream (TEMOVATE) 0.05 % APPLY TO AFFECED ARES(S) TOPICALLY TWICE DAILY (Patient taking differently: Apply 1 application topically 2 (two) times daily. ) 45 g 0  . Continuous Blood Gluc Receiver (DEXCOM G6 RECEIVER) DEVI 1 Device by Does not apply route as directed. 1 Device 0  . fluconazole  (DIFLUCAN) 100 MG tablet Take 200 mg by mouth once a week.     . fluticasone (FLONASE) 50 MCG/ACT nasal spray USE TWO SPRAYS INTO EACH NOSTRILS DAILY (Patient taking differently: Place 2 sprays into both nostrils daily. USE TWO SPRAYS INTO EACH NOSTRILS DAILY) 16 g 6  . folic acid-vitamin b complex-vitamin c-selenium-zinc (DIALYVITE) 3 MG TABS tablet Take 1 tablet by mouth daily.    Marland Kitchen HUMALOG KWIKPEN 100 UNIT/ML KwikPen INJECT 8 UNITS TOTAL INTO THE SKIN 3 (THREE) TIMES DAILY.  MAX DAILY DOSE OF 100 UNITS DAILY   90 mL 0  . hydrALAZINE (APRESOLINE) 100 MG tablet TAKE 1 TABLET (100 MG TOTAL) BY MOUTH 3 (THREE) TIMES DAILY. 270 tablet 1  . Insulin Glargine (LANTUS SOLOSTAR) 100 UNIT/ML Solostar Pen Inject 40 Units into the skin daily. INJECT 30 UNITS UNDER THE SKIN EVERY MORNING AND INJECT 15 UNITS EVERY NIGHT (Patient taking differently: Inject 40 Units into the skin daily. ) 36 mL 3  . Insulin Pen Needle (B-D UF III MINI PEN NEEDLES) 31G X 5 MM MISC Four times daily 360 each 0  . lidocaine-prilocaine (EMLA) cream Apply 1 application topically See admin instructions. Apply 1 application topical on Monday Wednesday and fridays    . Multiple Vitamin (MULTIVITAMIN WITH MINERALS) TABS tablet Take 1 tablet by mouth daily.    Marland Kitchen NARCAN 4 MG/0.1ML LIQD nasal spray kit Place 2 mLs into the nose See admin instructions. May repeat dose every 2 to 3 minutes until responsive or ems arrives.    . Nicotine 21-14-7 MG/24HR KIT Place 1 patch onto the skin daily. 56 each 0  . Olopatadine HCl 0.2 % SOLN APPLY 1 DROP TO EYE DAILY. 2.5 mL 0  . ondansetron (ZOFRAN) 4 MG tablet Take 1 tablet (4 mg total) by mouth every 8 (eight) hours as needed for nausea or vomiting. 30 tablet 0  . Oxycodone HCl 10 MG TABS Take 10 mg by mouth 4 (four) times daily.     . pregabalin (LYRICA) 100 MG capsule Take 100 mg by mouth 2 (two) times daily.     Marland Kitchen rOPINIRole (REQUIP) 0.25 MG tablet Take 0.25 mg by mouth at bedtime.    . verapamil  (CALAN-SR) 240 MG CR tablet Take 1 tablet (240 mg total) by mouth at bedtime. 90 tablet 3   No current facility-administered medications for this visit.    PAST MEDICAL HISTORY: Past Medical History:  Diagnosis Date  . Acute diastolic heart failure ([place])   . CHF (congestive heart failure) ([place])   . Diabetes mellitus with nephropathy ([place]) 05/12/2012   Dr. Justin Mend follows - dialysis three times weekly  . Hemorrhoids 05/12/2012  . Hyperlipidemia   . Hypertension   . Lumbar spinal stenosis 05/03/2012   Mild with only right L4 nerve root encroachment, no neurogenic claudication   . Lumbar spondylosis 05/03/2012  . Meralgia paraesthetica 05/03/2012  . Meralgia paraesthetica 05/03/2012   On Lyrica which does improve pain.   Marland Kitchen Neuropathy in diabetes ([place]) 05/12/2012  . Obesity   . Pneumonia   . Retinopathy   . Sleep apnea    uses cpap  . Substance abuse (  [place])   . Urinary incontinence 05/12/2012   Since the start of Sept. 2013     PAST SURGICAL HISTORY: Past Surgical History:  Procedure Laterality Date  . ABDOMINAL SURGERY     Abscess I&D 2/2 infected hair  . AV FISTULA PLACEMENT Left 02/06/2016   Procedure: LEFT ARM RADIOCEPHALIC ARTERIOVENOUS (AV) FISTULA CREATION;  Surgeon: Rosetta Posner, MD;  Location: [facility];  Service: Vascular;  Laterality: Left;  . COLONOSCOPY W/ POLYPECTOMY     pt to bring records  . COLONOSCOPY WITH PROPOFOL N/A 06/05/2016   Procedure: COLONOSCOPY WITH PROPOFOL;  Surgeon: Doran Stabler, MD;  Location: [facility];  Service: Gastroenterology;  Laterality: N/A;  . TONSILLECTOMY      FAMILY HISTORY: Family History  Problem Relation Age of Onset  . Prostate cancer Father   . Alcohol abuse Father   . Emphysema Father        smoked  . Memory loss Mother   . Diabetes Sister   . Colon cancer Neg Hx   . Colon polyps Neg Hx   . Stomach cancer Neg Hx   . Rectal cancer Neg Hx     SOCIAL HISTORY: Social History   Socioeconomic History  . Marital status: Single      Spouse name: Not on file  . Number of children: 4  . Years of education: GED  . Highest education level: Not on file  Occupational History  . Occupation: Disabled  Tobacco Use  . Smoking status: Current Every Day Smoker    Packs/day: 0.50    Years: 30.00    Pack years: 15.00    Types: Cigarettes  . Smokeless tobacco: Never Used  Substance and Sexual Activity  . Alcohol use: No    Comment: no longer drinking  . Drug use: No    Types: Marijuana, Cocaine    Comment: no longer using - marijuana "laced with something"; today, stated "no" to question of illegal drug use 06/16/2016  . Sexual activity: Not on file  Other Topics Concern  . Not on file  Social History Narrative   Lives at home with a caregiver.   One cup caffeine daily.   Right-handed.   Social Determinants of Health   Financial Resource Strain:   . Difficulty of Paying Living Expenses: Not on file  Food Insecurity:   . Worried About Charity fundraiser in the Last Year: Not on file  . Ran Out of Food in the Last Year: Not on file  Transportation Needs:   . Lack of Transportation (Medical): Not on file  . Lack of Transportation (Non-Medical): Not on file  Physical Activity:   . Days of Exercise per Week: Not on file  . Minutes of Exercise per Session: Not on file  Stress:   . Feeling of Stress : Not on file  Social Connections:   . Frequency of Communication with Friends and Family: Not on file  . Frequency of Social Gatherings with Friends and Family: Not on file  . Attends Religious Services: Not on file  . Active Member of Clubs or Organizations: Not on file  . Attends Archivist Meetings: Not on file  . Marital Status: Not on file  Intimate Partner Violence:   . Fear of Current or Ex-Partner: Not on file  . Emotionally Abused: Not on file  . Physically Abused: Not on file  . Sexually Abused: Not on file     PHYSICAL EXAM   Vitals:  08/14/19 0754  BP: 111/69  Pulse: 85  Temp: (!)  96.3 F (35.7 C)    Not recorded      There is no height or weight on file to calculate BMI.  PHYSICAL EXAMNIATION:  Gen: NAD, conversant, well nourised, well groomed                     Cardiovascular: Regular rate rhythm, no peripheral edema, warm, nontender. Eyes: Conjunctivae clear without exudates or hemorrhage Neck: Supple, no carotid bruits. Pulmonary: Clear to auscultation bilaterally   NEUROLOGICAL EXAM:  MENTAL STATUS: Tired looking middle-age male Speech:    Speech is normal; fluent and spontaneous with normal comprehension.  Cognition:     Orientation to time, place and person     Normal recent and remote memory     Normal Attention span and concentration     Normal Language, naming, repeating,spontaneous speech     Fund of knowledge   CRANIAL NERVES: CN II: Visual fields are full to confrontation.  Pupils are round equal and briskly reactive to light. CN III, IV, VI: extraocular movement are normal. No ptosis. CN V: Facial sensation is intact to pinprick in all 3 divisions bilaterally. Corneal responses are intact.  CN VII: Face is symmetric with normal eye closure CN VIII: Hearing is normal to causal conversation. CN IX, X:. Phonation is normal. CN XI: Head turning and shoulder shrug are intact   MOTOR: There is asterixis at bilateral outstretching arms, there was no significant weakness.  REFLEXES: Reflexes are 1 and symmetric at the biceps, triceps, knees, and ankles. Plantar responses are flexor.  SENSORY: Length dependent decreased to light touch, pinprick, vibratory sensation to bilateral knee level  COORDINATION: Rapid alternating movements and fine finger movements are intact. There is no dysmetria on finger-to-nose and heel-knee-shin.    GAIT/STANCE: Deferred DIAGNOSTIC DATA (LABS, IMAGING, TESTING) - I reviewed patient records, labs, notes, testing and imaging myself where available.   ASSESSMENT AND PLAN  Jerry Cantrell is a 53 y.o.  male   Body shaking episode  Most suggestive of metabolic asterixis   MRI of brain showed mild abnormality  EEG showed mild background slowing  I have advised him to simplify his medications, decrease pain medicine to lower dose  Laboratory evaluation for metabolic toxic etiology   Marcial Pacas, M.D. Ph.D.  [facility] [address], [place], [place] [address] Ph: [phone] Fax: [phone]  CC: Forrest Moron, MD

## 2019-08-14 NOTE — Progress Notes (Signed)
Virtual Visit via Video Note  I connected with Jerry Cantrell on 08/14/19 at  9:00 AM EST by a video enabled telemedicine application and verified that I am speaking with the correct person using two identifiers.  Location: Patient: At home Provider: In the office at [place], [place], [address]   I discussed the limitations of evaluation and management by telemedicine and the availability of in person appointments. The patient expressed understanding and agreed to proceed.  Jerry Martinis a 53 y.o.malecurrent every day smokerwithpolysubstance abuse, severe OSA on nocturnal BiPAP, chronic hypoxic respiratory failure and ESRD requiring HD. He is followed by Dr. Elsworth Soho  Synopsis 53 year old male active smoker with polysubstance abuse (cocaine) followed for severe obstructive sleep apnea on nocturnal BiPAP, chronic hypoxic respiratory failurepreviously on O2 (d/c 2019 ) ,stable pulmonary nodules(serial CT chest x 3Years 2016-2019), Chronic Bronchitis -active smoker (Mostly Restrictive dz on PFT, CT )  Medical history significant for chronic diastolic heart failure and chronic kidney disease stage IV-has an AV fistula in the left arm Hemodialysis MWF. BiPAP - Med Simplus FFM; 5-25 ps 5 bipap auto. He had bipap titration study in July 2020 (optimal setting 28/23 cm H20).   History of Present Illness: Pt. Presents for follow up of BiPap treatment. He has received a new machine, and this is a compliance check. He needed a new machine to allow for the pressures to be increased to the optimized pressures based on a BiPAP titration study done 03/2019. He states he got his machine about 2 weeks ago.  He states he needed to get new straps, so he started wearing the machine about a week ago with the new straps. He has been wearing the BiPAP device during the day and at bedtime . He states the machine seems to stop at intervals, but then turns itself back on. He feels his face mask  is too small . He feels he would get a better mask seal with a bigger mask. Down Load with AHI of 2.4 on current settings. He states he has worn the machine every night since getting the correct straps.    Observations/Objective: Down Load 08/02/2019 AirCurve 10  V Auto Mode Max IPAP 25 cm H2O Max EPAP 15 cm H2O PS is 5 cm H2O  Usage 8/9 nights or 88% of the time Average usage hours are 9 hours 8 minutes Central apneas 0.1, Obstructive Apneas 1.9, Unknown 0.1 AHI 2.4  Pt. Has good control with current settings, but will have DME increase to titration study recommendations.   Assessment and Plan: OSA on BiPAP Received new BiPAP machine Good Compliance  Plan We will place an order for equipment to have the DME send a larger mask as we discussed We will have the DME increase the pressures to the optimal pressures the BIPAP titration study recommended. ( 28/23) Continue on BiPAP at bedtime. You appear to be benefiting from the treatment  Goal is to wear for at least 6 hours each night for maximal clinical benefit. Continue to work on weight loss, as the link between excess weight  and sleep apnea is well established.   Remember to establish a good bedtime routine, and work on sleep hygiene.  Limit daytime naps , avoid stimulants such as caffeine and nicotine close to bedtime, exercise daily to promote sleep quality, avoid heavy , spicy, fried , or rich foods before bed. Ensure adequate exposure to natural light during the day,establish a relaxing bedtime routine with a pleasant sleep  environment ( Bedroom between 60 and 67 degrees, turn off bright lights , TV or device screens screens , consider black out curtains or white noise machines) Do not drive if sleepy. Remember to clean mask, tubing, filter, and reservoir once weekly with soapy water.  Follow up with Dr. Elsworth Soho or Judson Roch   In 2 months or before as needed.  Please contact office for sooner follow up if symptoms do not improve or  worsen or seek emergency care     Follow Up Instructions: Follow up in 2 months with Judson Roch NP or Dr. Elsworth Soho I discussed the assessment and treatment plan with the patient. The patient was provided an opportunity to ask questions and all were answered. The patient agreed with the plan and demonstrated an understanding of the instructions.   The patient was advised to call back or seek an in-person evaluation if the symptoms worsen or if the condition fails to improve as anticipated.  I provided 40  minutes of non-face-to-face time during this encounter.   Magdalen Spatz, NP 08/14/2019 11:03 AM smoker with polysubstance abuse (cocaine) followed for severe obstructive sleep apnea on nocturnal BiPAP, chronic hypoxic respiratory failurepreviously on O2 (d/c 2019 ) ,stable pulmonary nodules(serial CT chest x 3Years 2016-2019), Chronic Bronchitis -active smoker (Mostly Restrictive dz on PFT, CT )  Medical history significant for chronic diastolic heart failure and chronic kidney disease stage IV-has an AV fistula in the left arm Hemodialysis MWF. BiPAP - Med Simplus FFM; 5-25 ps 5 bipap auto. He had bipap titration study in July 2020 (optimal setting 28/23 cm H20).   History of Present Illness: Pt. Presents for follow up of BiPap treatment. He has received a new machine, and this is a compliance check. He needed a new machine to allow for the pressures to be increased to the optimized pressures based on a BiPAP titration study done 03/2019. He states he got his machine about 2 weeks ago.  He states he needed to get new straps, so he started wearing the machine about a week ago with the new straps. He has been wearing the BiPAP device during the day and at bedtime . He states the machine seems to stop at intervals, but then turns itself back on. He feels his face mask  is too small . He feels he would get a better mask seal with a bigger mask. Down Load with AHI of 2.4 on current settings. He states he has worn the machine every night since getting the correct straps.    Observations/Objective: Down Load 08/02/2019 AirCurve 10  V Auto Mode Max IPAP 25 cm H2O Max EPAP 15 cm H2O PS is 5 cm H2O  Usage 8/9 nights or 88% of the time Average usage hours are 9 hours 8 minutes Central apneas 0.1, Obstructive Apneas 1.9, Unknown 0.1 AHI 2.4  Pt. Has good control with current settings, but will have DME increase to titration study recommendations.   Assessment and Plan: OSA on BiPAP Received new BiPAP machine Good Compliance  Plan We will place an order for equipment to have the DME send a larger mask as we discussed We will have the DME increase the pressures to the optimal pressures the BIPAP titration study recommended. ( 28/23) Continue on BiPAP at bedtime. You appear to be benefiting from the treatment  Goal is to wear for at least 6 hours each night for maximal clinical benefit. Continue to work on weight loss, as the link between excess weight  and sleep apnea is well established.   Remember to establish a good bedtime routine, and work on sleep hygiene.  Limit daytime naps , avoid stimulants such as caffeine and nicotine close to bedtime, exercise daily to promote sleep quality, avoid heavy , spicy, fried , or rich foods before bed. Ensure adequate exposure to natural light during the day,establish a relaxing bedtime routine with a pleasant sleep  environment ( Bedroom between 60 and 67 degrees, turn off bright lights , TV or device screens screens , consider black out curtains or white noise machines) Do not drive if sleepy. Remember to clean mask, tubing, filter, and reservoir once weekly with soapy water.  Follow up with Dr. Elsworth Soho or Judson Roch   In 2 months or before as needed.  Please contact office for sooner follow up if symptoms do not improve or  worsen or seek emergency care     Follow Up Instructions: Follow up in 2 months with Judson Roch NP or Dr. Elsworth Soho I discussed the assessment and treatment plan with the patient. The patient was provided an opportunity to ask questions and all were answered. The patient agreed with the plan and demonstrated an understanding of the instructions.   The patient was advised to call back or seek an in-person evaluation if the symptoms worsen or if the condition fails to improve as anticipated.  I provided 40  minutes of non-face-to-face time during this encounter.   Magdalen Spatz, NP 08/14/2019 11:03 AM

## 2019-08-14 NOTE — Telephone Encounter (Signed)
Ok FYI Sg

## 2019-08-14 NOTE — Telephone Encounter (Signed)
Response from [employer] ph#[phone]:  Vella Kohler D sent to Elon Alas  I will open a telephone message for you.       Previous Messages   ----- Message -----  From: Elon Alas  Sent: 08/14/2019  2:57 PM EST  To: Darlina Guys, Elon Alas, *  Subject: RE: Elmer Bales, the order is entered for pressures that cannot be done by a home BIPAP. The max pressure is 25. We received the same order in October and advised at that time about the pressure setting and that the manufacturer no longer makes BIPAPs that go above 25. We got an order to do 25/23.   You may need to speak to [facility] and maybe the patient may have to go to [facility]. So, I am not going to pull this referral since we cannot do this pressure the provider has asked for.

## 2019-08-14 NOTE — Telephone Encounter (Signed)
Since his AHI is 2.4, we will leave him on the new BiPAP for now.Thanks

## 2019-08-14 NOTE — Patient Instructions (Signed)
[facility]  Pain control clinic in [place], [place] Address: [address], [place], [place] [address]  Phone: [phone]

## 2019-08-14 NOTE — Patient Instructions (Addendum)
  It was good to talk with you today. We will place an order for equipment to have the DME send a larger mask as we discussed We will have the DME increase the pressures to the optimal pressures the BIPAP titration study recommended. ( 28/ Continue on BiPAP at bedtime. You appear to be benefiting from the treatment  Goal is to wear for at least 6 hours each night for maximal clinical benefit. Continue to work on weight loss, as the link between excess weight  and sleep apnea is well established.   Remember to establish a good bedtime routine, and work on sleep hygiene.  Limit daytime naps , avoid stimulants such as caffeine and nicotine close to bedtime, exercise daily to promote sleep quality, avoid heavy , spicy, fried , or rich foods before bed. Ensure adequate exposure to natural light during the day,establish a relaxing bedtime routine with a pleasant sleep environment ( Bedroom between 60 and 67 degrees, turn off bright lights , TV or device screens screens , consider black out curtains or white noise machines) Do not drive if sleepy. Remember to clean mask, tubing, filter, and reservoir once weekly with soapy water.  Follow up with Dr. Elsworth Soho or Judson Roch   In 2 months or before as needed.  Please contact office for sooner follow up if symptoms do not improve or worsen or seek emergency care   Follow up in 2 months with download , or sooner if needed.  I hope you have a happy holiday.

## 2019-08-15 ENCOUNTER — Telehealth: Payer: Self-pay | Admitting: Neurology

## 2019-08-15 ENCOUNTER — Telehealth: Payer: Self-pay

## 2019-08-15 ENCOUNTER — Encounter: Payer: Medicare Other | Admitting: Nutrition

## 2019-08-15 ENCOUNTER — Telehealth: Payer: Self-pay | Admitting: Nutrition

## 2019-08-15 DIAGNOSIS — Z743 Need for continuous supervision: Secondary | ICD-10-CM | POA: Diagnosis not present

## 2019-08-15 DIAGNOSIS — I469 Cardiac arrest, cause unspecified: Secondary | ICD-10-CM | POA: Diagnosis not present

## 2019-08-15 DIAGNOSIS — I499 Cardiac arrhythmia, unspecified: Secondary | ICD-10-CM | POA: Diagnosis not present

## 2019-08-15 DIAGNOSIS — R404 Transient alteration of awareness: Secondary | ICD-10-CM | POA: Diagnosis not present

## 2019-08-16 ENCOUNTER — Telehealth: Payer: Self-pay | Admitting: Family Medicine

## 2019-08-16 NOTE — Telephone Encounter (Signed)
Left patient a detailed message, with results and recommendations, on voicemail (ok per DPR).  Provided our number to call back with any questions.  I also faxed lab results to Dr. Nolon Rod (PCP) and Dr. Justin Mend (nephrologist).

## 2019-08-16 NOTE — Telephone Encounter (Signed)
Open in error

## 2019-08-16 NOTE — Telephone Encounter (Signed)
Pt missed appt today at [facility]. Office needs cause of death for patient. CB is [phone]. Ask for  Eudelia Bunch

## 2019-08-16 NOTE — Telephone Encounter (Signed)
Please advise 

## 2019-08-23 ENCOUNTER — Telehealth: Payer: Self-pay | Admitting: Family Medicine

## 2019-08-23 LAB — COMPREHENSIVE METABOLIC PANEL
ALT: 25 IU/L (ref 0–44)
AST: 19 IU/L (ref 0–40)
Albumin/Globulin Ratio: 1.6 (ref 1.2–2.2)
Albumin: 3.9 g/dL (ref 3.8–4.9)
Alkaline Phosphatase: 160 IU/L — ABNORMAL HIGH (ref 39–117)
BUN/Creatinine Ratio: 7 — ABNORMAL LOW (ref 9–20)
BUN: 91 mg/dL (ref 6–24)
Bilirubin Total: 0.3 mg/dL (ref 0.0–1.2)
CO2: 26 mmol/L (ref 20–29)
Calcium: 10.4 mg/dL — ABNORMAL HIGH (ref 8.7–10.2)
Chloride: 87 mmol/L — ABNORMAL LOW (ref 96–106)
Creatinine, Ser: 12.36 mg/dL — ABNORMAL HIGH (ref 0.76–1.27)
GFR calc Af Amer: 5 mL/min/{1.73_m2} — ABNORMAL LOW (ref >59)
GFR calc non Af Amer: 4 mL/min/{1.73_m2} — ABNORMAL LOW (ref >59)
Globulin, Total: 2.4 g/dL (ref 1.5–4.5)
Glucose: 270 mg/dL — ABNORMAL HIGH (ref 65–99)
Potassium: 8.5 mmol/L (ref 3.5–5.2)
Sodium: 135 mmol/L (ref 134–144)
Total Protein: 6.3 g/dL (ref 6.0–8.5)

## 2019-08-23 LAB — DRUG SCREEN 10 W/CONF, WB
Amphetamines, IA: NEGATIVE ng/mL
Barbiturates, IA: NEGATIVE ug/mL
Benzodiazepines, IA: NEGATIVE ng/mL
Cocaine/Metabolite, IA: NEGATIVE ng/mL
Methadone, IA: NEGATIVE ng/mL
Opiates, IA: NEGATIVE ng/mL
Oxycodones, IA: POSITIVE ng/mL — AB
Phencyclidine, IA: NEGATIVE ng/mL
Propoxyphene, IA: NEGATIVE ng/mL
THC (Marijuana) Mtb, IA: NEGATIVE ng/mL

## 2019-08-23 LAB — OXYCODONES,MS,WB/SP RFX
Oxycocone: 20 ng/mL
Oxycodones Confirmation: POSITIVE
Oxymorphone: NEGATIVE ng/mL

## 2019-08-23 LAB — ETHANOL

## 2019-08-23 LAB — THYROID PANEL WITH TSH
Free Thyroxine Index: 1.7 (ref 1.2–4.9)
T3 Uptake Ratio: 28 % (ref 24–39)
T4, Total: 5.9 ug/dL (ref 4.5–12.0)
TSH: 3.68 u[IU]/mL (ref 0.450–4.500)

## 2019-08-23 LAB — COPPER, SERUM: Copper: 132 ug/dL (ref 72–166)

## 2019-08-23 LAB — VITAMIN D 25 HYDROXY (VIT D DEFICIENCY, FRACTURES): Vit D, 25-Hydroxy: 16.8 ng/mL — ABNORMAL LOW (ref 30.0–100.0)

## 2019-08-23 LAB — VITAMIN B12: Vitamin B-12: 915 pg/mL (ref 232–1245)

## 2019-08-23 LAB — CK: Total CK: 59 U/L (ref 41–331)

## 2019-08-23 NOTE — Telephone Encounter (Signed)
I have faxed the certificate and got a confirmation. I have called [facility] to let them know it's ready for pick up. I have placed in pick up box.

## 2019-08-23 NOTE — Telephone Encounter (Signed)
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 When complete fax to [phone], so he can cremate. Then call [phone] for someone to pick up.

## 2019-08-23 NOTE — Telephone Encounter (Signed)
Confirmed with Dr Nolon Rod that she do have the death certificate and will fill out and fax once she has it completed

## 2019-09-01 NOTE — Telephone Encounter (Signed)
Please call patient, laboratory evaluation showed decreased vitamin D 17, he would benefit from vitamin D3supplement 1000 units daily  Multiple abnormality on CMP, significantly elevated creatinine 12.4, glucose 270, potassium 8.5, elevated calcium 10.4,  Please emphasize importance of compliant with his nephrology and hemodialysis appointment.  The metabolic toxic disarrangement likely explain a lot of his complaints.

## 2019-09-01 NOTE — Telephone Encounter (Signed)
Patient called to cance his appointment today saying he was very weak.  He reported having put on a new Dexcom sensor, but it did not stay on.  He then put on another one, attached the transmitter, waited 2 hours,and it did not work.  He was told to call the help line, telephone number given, to replace the sensors and get some better adhesive wipes and more tape.  He agreed to do this.

## 2019-09-01 NOTE — Telephone Encounter (Signed)
TC from [facility] stating pt has deceased.  Need provider, Dr. Nolon Rod, to state that she would be willing to sign the death certificate.  Phone call to Dr. Nolon Rod and confirmed.

## 2019-09-01 NOTE — Telephone Encounter (Signed)
I sent Jonn Shingles a message letting her know to keep pt on the same pressures. Nothing further is needed.

## 2019-09-01 DEATH — deceased

## 2019-09-12 ENCOUNTER — Ambulatory Visit: Payer: Medicare Other | Admitting: Podiatry

## 2019-09-19 ENCOUNTER — Ambulatory Visit: Payer: Medicare Other | Admitting: Internal Medicine

## 2019-10-17 ENCOUNTER — Telehealth: Payer: Self-pay | Admitting: Nutrition

## 2019-10-17 NOTE — Telephone Encounter (Signed)
CGM orders sent to [facility] on 08/02/19
